# Patient Record
Sex: Female | Born: 1950
Health system: Southern US, Community
[De-identification: ages and names within clinical notes are randomized; demographics above are authoritative.]

## PROBLEM LIST (undated history)

## (undated) DIAGNOSIS — N393 Stress incontinence (female) (male): Secondary | ICD-10-CM

## (undated) DIAGNOSIS — K5790 Diverticulosis of intestine, part unspecified, without perforation or abscess without bleeding: Secondary | ICD-10-CM

## (undated) DIAGNOSIS — E039 Hypothyroidism, unspecified: Secondary | ICD-10-CM

## (undated) DIAGNOSIS — E8729 Other acidosis: Secondary | ICD-10-CM

## (undated) DIAGNOSIS — IMO0001 Reserved for inherently not codable concepts without codable children: Secondary | ICD-10-CM

## (undated) DIAGNOSIS — Z9889 Other specified postprocedural states: Secondary | ICD-10-CM

## (undated) DIAGNOSIS — E782 Mixed hyperlipidemia: Secondary | ICD-10-CM

## (undated) DIAGNOSIS — I10 Essential (primary) hypertension: Secondary | ICD-10-CM

## (undated) DIAGNOSIS — Z720 Tobacco use: Secondary | ICD-10-CM

## (undated) DIAGNOSIS — Z860100 Personal history of colon polyps, unspecified: Secondary | ICD-10-CM

## (undated) DIAGNOSIS — M1990 Unspecified osteoarthritis, unspecified site: Secondary | ICD-10-CM

## (undated) DIAGNOSIS — K589 Irritable bowel syndrome without diarrhea: Secondary | ICD-10-CM

## (undated) DIAGNOSIS — J449 Chronic obstructive pulmonary disease, unspecified: Secondary | ICD-10-CM

## (undated) DIAGNOSIS — E119 Type 2 diabetes mellitus without complications: Secondary | ICD-10-CM

## (undated) DIAGNOSIS — F4310 Post-traumatic stress disorder, unspecified: Secondary | ICD-10-CM

## (undated) DIAGNOSIS — E079 Disorder of thyroid, unspecified: Secondary | ICD-10-CM

## (undated) DIAGNOSIS — E872 Acidosis: Secondary | ICD-10-CM

## (undated) HISTORY — PX: OTHER SURGICAL HISTORY: SHX169

## (undated) HISTORY — DX: Mixed hyperlipidemia: E78.2

## (undated) HISTORY — PX: TUBAL LIGATION: SHX77

## (undated) HISTORY — DX: Disorder of thyroid, unspecified: E07.9

## (undated) HISTORY — DX: Hypothyroidism, unspecified: E03.9

## (undated) HISTORY — DX: Personal history of colon polyps, unspecified: Z86.0100

## (undated) HISTORY — DX: Personal history of colonic polyps: Z86.010

## (undated) HISTORY — DX: Stress incontinence (female) (male): N39.3

## (undated) HISTORY — DX: Essential (primary) hypertension: I10

## (undated) HISTORY — PX: SKIN LESION EXCISION: SHX2412

## (undated) HISTORY — DX: Acidosis: E87.2

## (undated) HISTORY — DX: Other acidosis: E87.29

## (undated) HISTORY — DX: Type 2 diabetes mellitus without complications: E11.9

## (undated) HISTORY — DX: Other specified postprocedural states: Z98.890

---

## 2001-04-02 ENCOUNTER — Emergency Department (HOSPITAL_COMMUNITY): Admission: EM | Admit: 2001-04-02 | Discharge: 2001-04-02 | Payer: Self-pay | Admitting: Emergency Medicine

## 2001-04-02 ENCOUNTER — Encounter: Payer: Self-pay | Admitting: Emergency Medicine

## 2001-04-27 ENCOUNTER — Ambulatory Visit (HOSPITAL_COMMUNITY): Admission: RE | Admit: 2001-04-27 | Discharge: 2001-04-27 | Payer: Self-pay | Admitting: Internal Medicine

## 2001-04-27 ENCOUNTER — Encounter: Payer: Self-pay | Admitting: Internal Medicine

## 2001-06-20 ENCOUNTER — Encounter: Admission: RE | Admit: 2001-06-20 | Discharge: 2001-09-18 | Payer: Self-pay | Admitting: Internal Medicine

## 2001-08-10 ENCOUNTER — Other Ambulatory Visit: Admission: RE | Admit: 2001-08-10 | Discharge: 2001-08-10 | Payer: Self-pay | Admitting: Obstetrics and Gynecology

## 2001-09-15 ENCOUNTER — Emergency Department (HOSPITAL_COMMUNITY): Admission: EM | Admit: 2001-09-15 | Discharge: 2001-09-15 | Payer: Self-pay | Admitting: Emergency Medicine

## 2001-11-27 ENCOUNTER — Other Ambulatory Visit: Admission: RE | Admit: 2001-11-27 | Discharge: 2001-11-27 | Payer: Self-pay | Admitting: Obstetrics and Gynecology

## 2002-03-31 ENCOUNTER — Inpatient Hospital Stay (HOSPITAL_COMMUNITY): Admission: EM | Admit: 2002-03-31 | Discharge: 2002-04-05 | Payer: Self-pay | Admitting: *Deleted

## 2002-05-02 ENCOUNTER — Emergency Department (HOSPITAL_COMMUNITY): Admission: EM | Admit: 2002-05-02 | Discharge: 2002-05-02 | Payer: Self-pay | Admitting: *Deleted

## 2002-05-02 ENCOUNTER — Encounter: Payer: Self-pay | Admitting: *Deleted

## 2002-06-12 ENCOUNTER — Ambulatory Visit (HOSPITAL_COMMUNITY): Admission: RE | Admit: 2002-06-12 | Discharge: 2002-06-12 | Payer: Self-pay | Admitting: Internal Medicine

## 2002-06-12 ENCOUNTER — Encounter: Payer: Self-pay | Admitting: Internal Medicine

## 2002-08-01 ENCOUNTER — Emergency Department (HOSPITAL_COMMUNITY): Admission: EM | Admit: 2002-08-01 | Discharge: 2002-08-02 | Payer: Self-pay | Admitting: *Deleted

## 2002-08-14 ENCOUNTER — Emergency Department (HOSPITAL_COMMUNITY): Admission: EM | Admit: 2002-08-14 | Discharge: 2002-08-14 | Payer: Self-pay | Admitting: Emergency Medicine

## 2002-08-24 ENCOUNTER — Encounter: Payer: Self-pay | Admitting: Emergency Medicine

## 2002-08-24 ENCOUNTER — Emergency Department (HOSPITAL_COMMUNITY): Admission: EM | Admit: 2002-08-24 | Discharge: 2002-08-24 | Payer: Self-pay | Admitting: Internal Medicine

## 2003-04-30 ENCOUNTER — Emergency Department (HOSPITAL_COMMUNITY): Admission: EM | Admit: 2003-04-30 | Discharge: 2003-04-30 | Payer: Self-pay | Admitting: Emergency Medicine

## 2003-07-24 ENCOUNTER — Emergency Department (HOSPITAL_COMMUNITY): Admission: EM | Admit: 2003-07-24 | Discharge: 2003-07-24 | Payer: Self-pay | Admitting: Emergency Medicine

## 2003-07-24 ENCOUNTER — Encounter: Payer: Self-pay | Admitting: Emergency Medicine

## 2003-12-10 ENCOUNTER — Ambulatory Visit (HOSPITAL_COMMUNITY): Admission: RE | Admit: 2003-12-10 | Discharge: 2003-12-10 | Payer: Self-pay | Admitting: Internal Medicine

## 2003-12-13 ENCOUNTER — Emergency Department (HOSPITAL_COMMUNITY): Admission: EM | Admit: 2003-12-13 | Discharge: 2003-12-14 | Payer: Self-pay | Admitting: Internal Medicine

## 2004-03-23 ENCOUNTER — Emergency Department (HOSPITAL_COMMUNITY): Admission: EM | Admit: 2004-03-23 | Discharge: 2004-03-24 | Payer: Self-pay | Admitting: Emergency Medicine

## 2004-04-03 ENCOUNTER — Emergency Department (HOSPITAL_COMMUNITY): Admission: EM | Admit: 2004-04-03 | Discharge: 2004-04-04 | Payer: Self-pay | Admitting: Emergency Medicine

## 2004-04-10 ENCOUNTER — Emergency Department (HOSPITAL_COMMUNITY): Admission: EM | Admit: 2004-04-10 | Discharge: 2004-04-10 | Payer: Self-pay | Admitting: Emergency Medicine

## 2004-04-17 ENCOUNTER — Ambulatory Visit (HOSPITAL_COMMUNITY): Admission: RE | Admit: 2004-04-17 | Discharge: 2004-04-17 | Payer: Self-pay | Admitting: Internal Medicine

## 2004-04-17 IMAGING — CR DG SHOULDER 2+V*L*
3 series · 3 of 3 positions shown · non-contrast
Comparison: none

CLINICAL DATA: Osteoarthritis.
 LEFT SHOULDER THREE VIEWS
 Joint space narrowing left glenohumeral joint.  No fracture or dislocation.  AC joint alignment normal.  Glenohumeral joint space narrowing seen.  
 IMPRESSION
 Glenohumeral joint space narrowing without acute bony abnormality.

[view not recorded (1 of 3)]
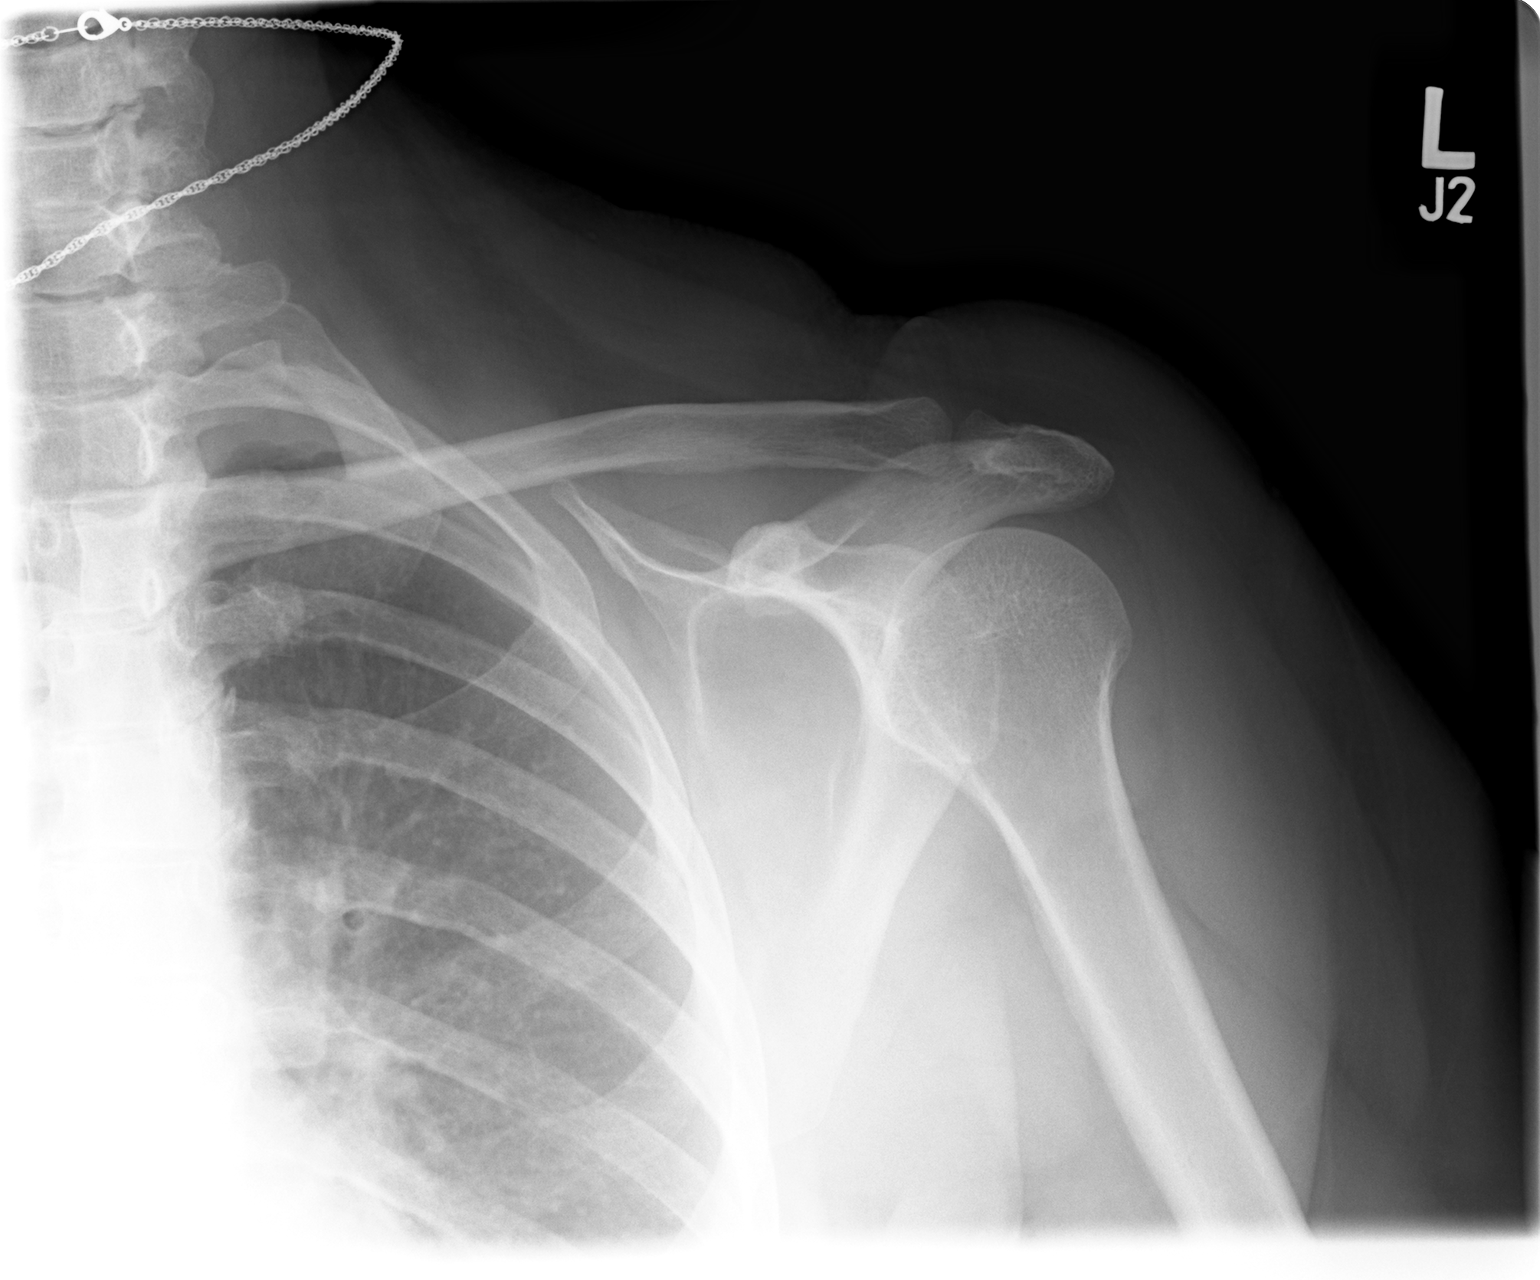

[view not recorded (2 of 3)]
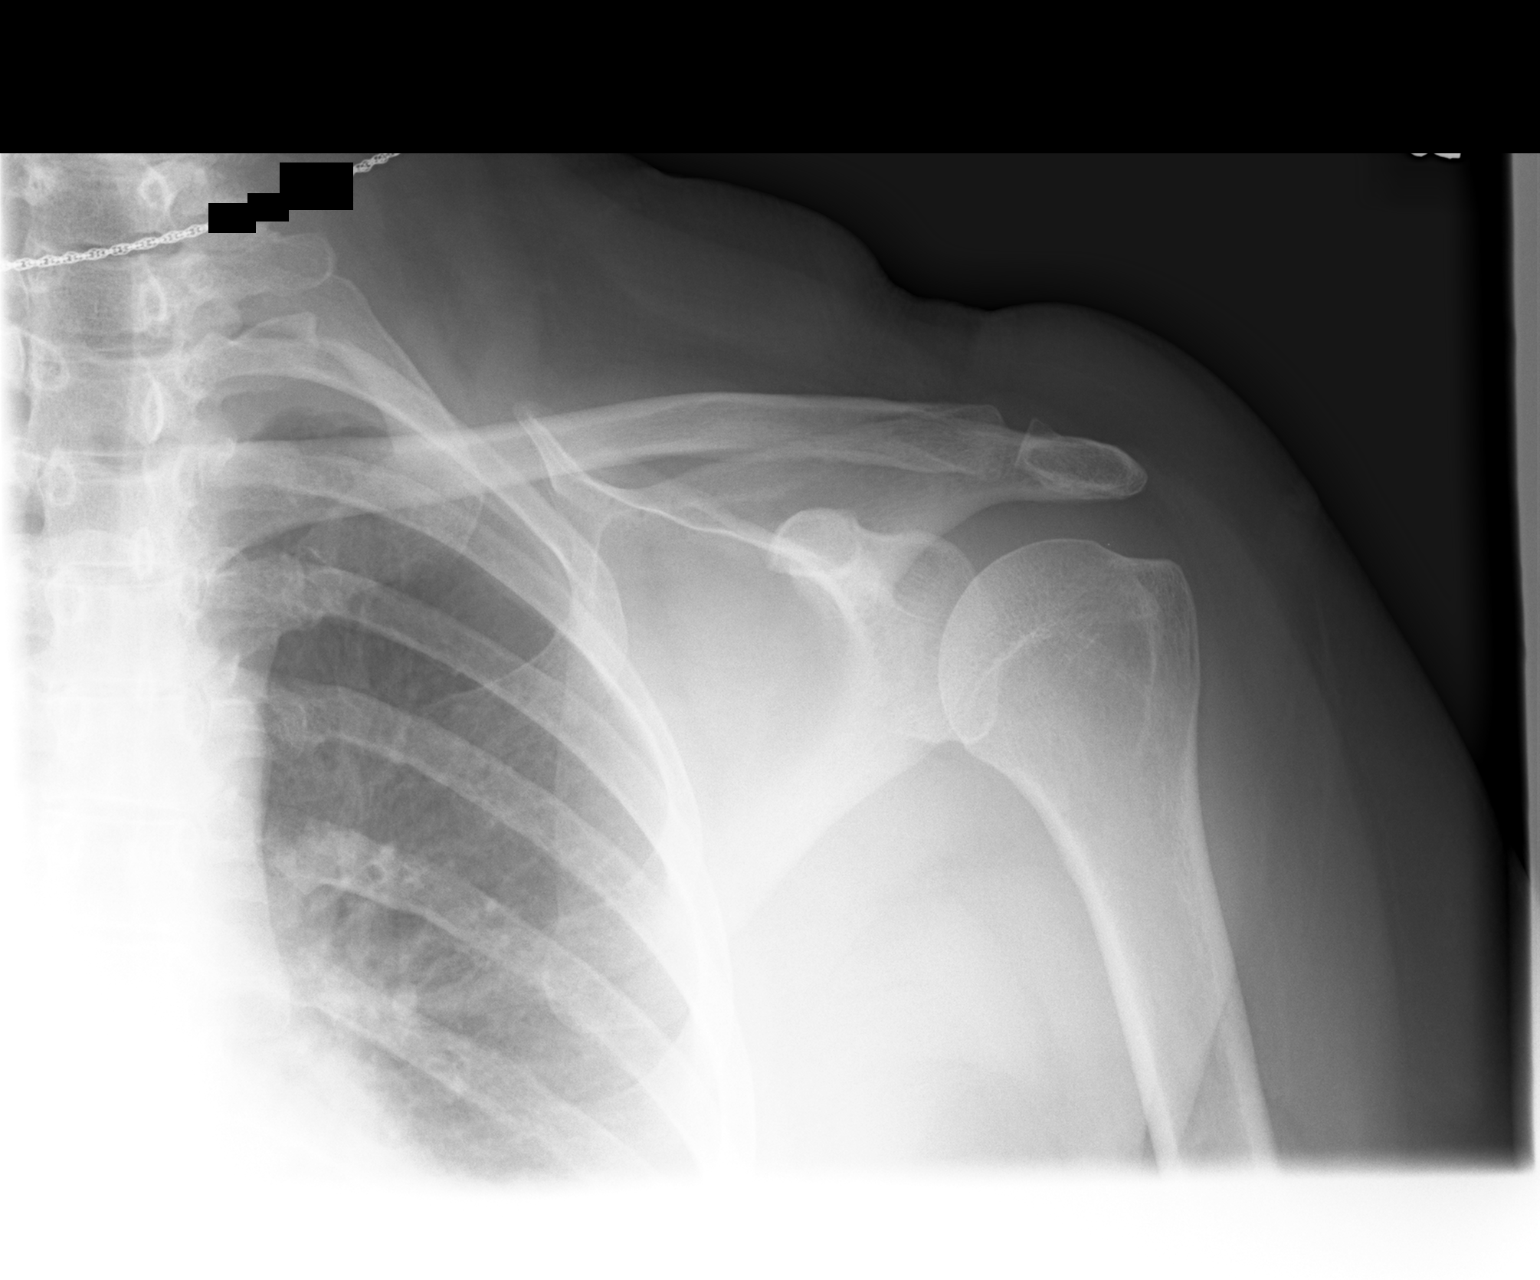

[view not recorded (3 of 3)]
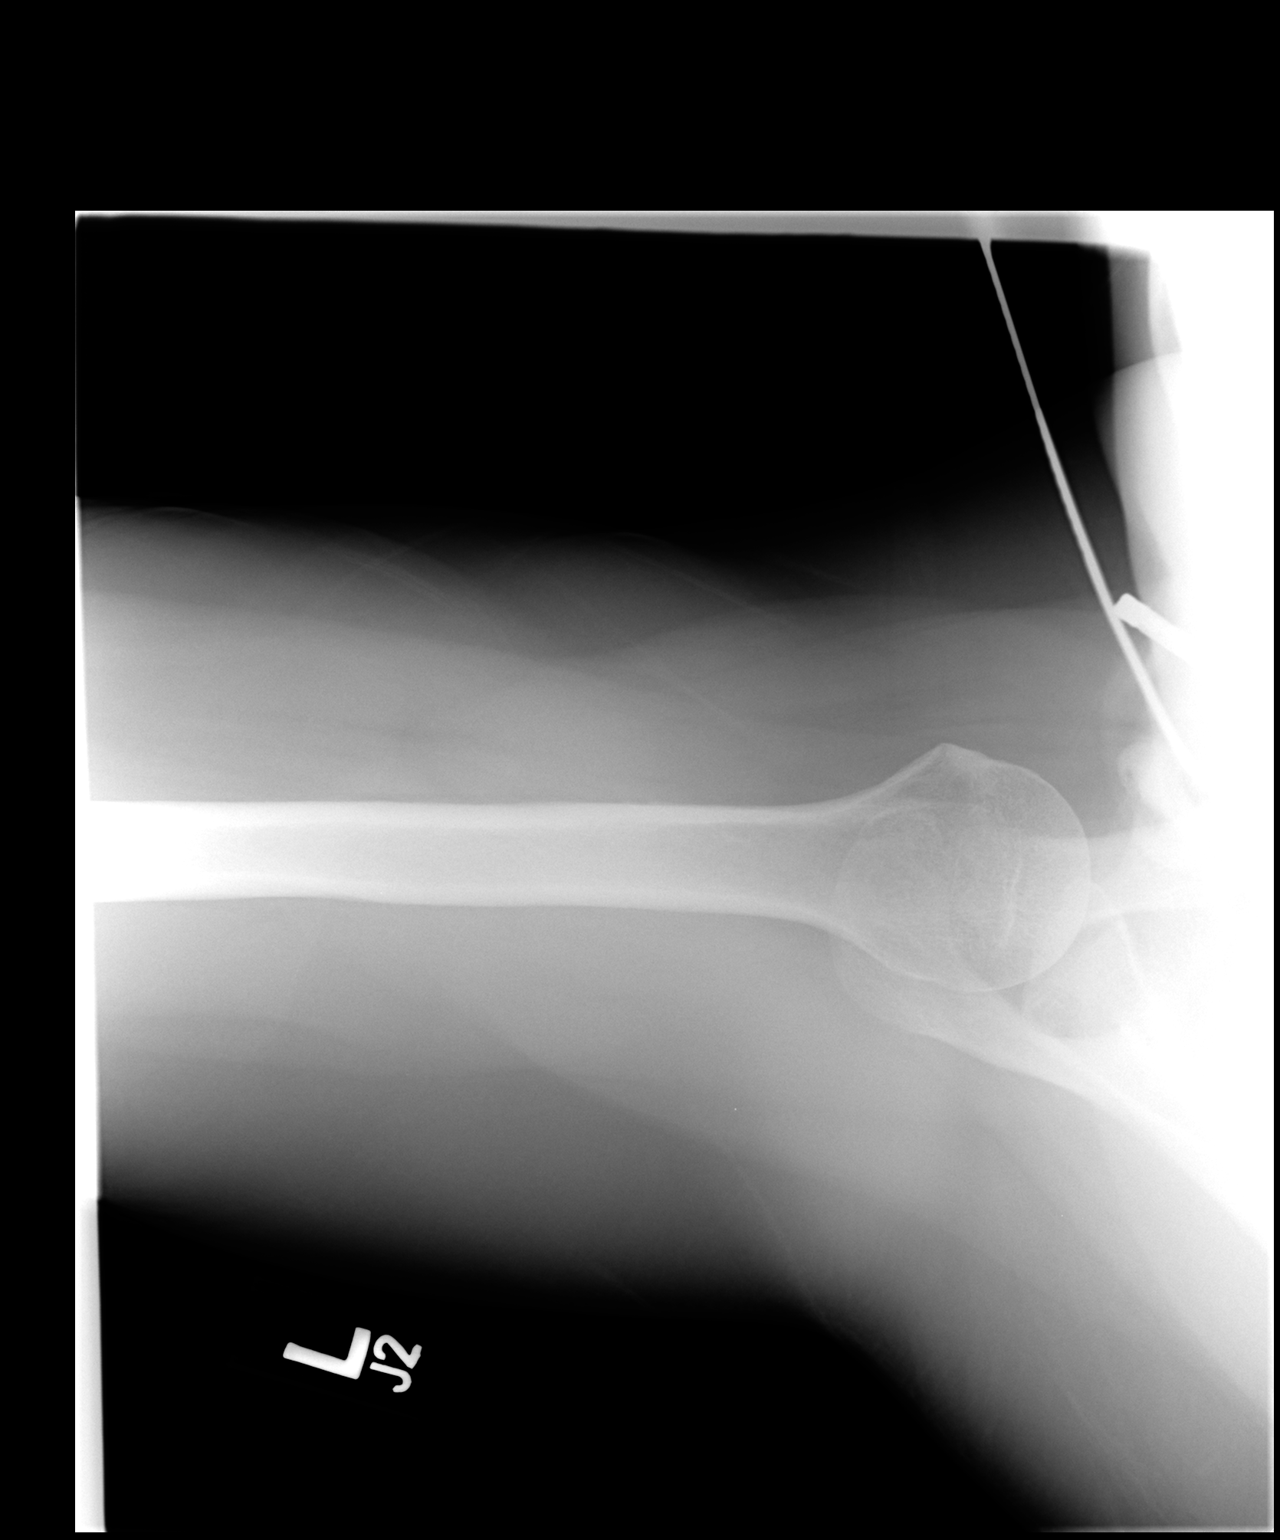

[3 of 3 positions shown; findings below may reference images not displayed]

## 2004-06-06 ENCOUNTER — Inpatient Hospital Stay (HOSPITAL_COMMUNITY): Admission: AD | Admit: 2004-06-06 | Discharge: 2004-06-12 | Payer: Self-pay | Admitting: Psychiatry

## 2004-09-07 ENCOUNTER — Emergency Department (HOSPITAL_COMMUNITY): Admission: EM | Admit: 2004-09-07 | Discharge: 2004-09-07 | Payer: Self-pay | Admitting: *Deleted

## 2005-04-12 ENCOUNTER — Ambulatory Visit (HOSPITAL_COMMUNITY): Admission: RE | Admit: 2005-04-12 | Discharge: 2005-04-12 | Payer: Self-pay | Admitting: Obstetrics & Gynecology

## 2005-11-06 ENCOUNTER — Emergency Department (HOSPITAL_COMMUNITY): Admission: EM | Admit: 2005-11-06 | Discharge: 2005-11-07 | Payer: Self-pay | Admitting: Emergency Medicine

## 2006-01-11 ENCOUNTER — Emergency Department (HOSPITAL_COMMUNITY): Admission: EM | Admit: 2006-01-11 | Discharge: 2006-01-11 | Payer: Self-pay | Admitting: Emergency Medicine

## 2006-01-11 IMAGING — CR DG LUMBAR SPINE COMPLETE 4+V
5 series · 5 of 5 positions shown · non-contrast
Comparison: None.

CLINICAL DATA: Back strain/lower back pain.
 LUMBAR SPINE ? 4 VIEW:

[view not recorded (1 of 5)]
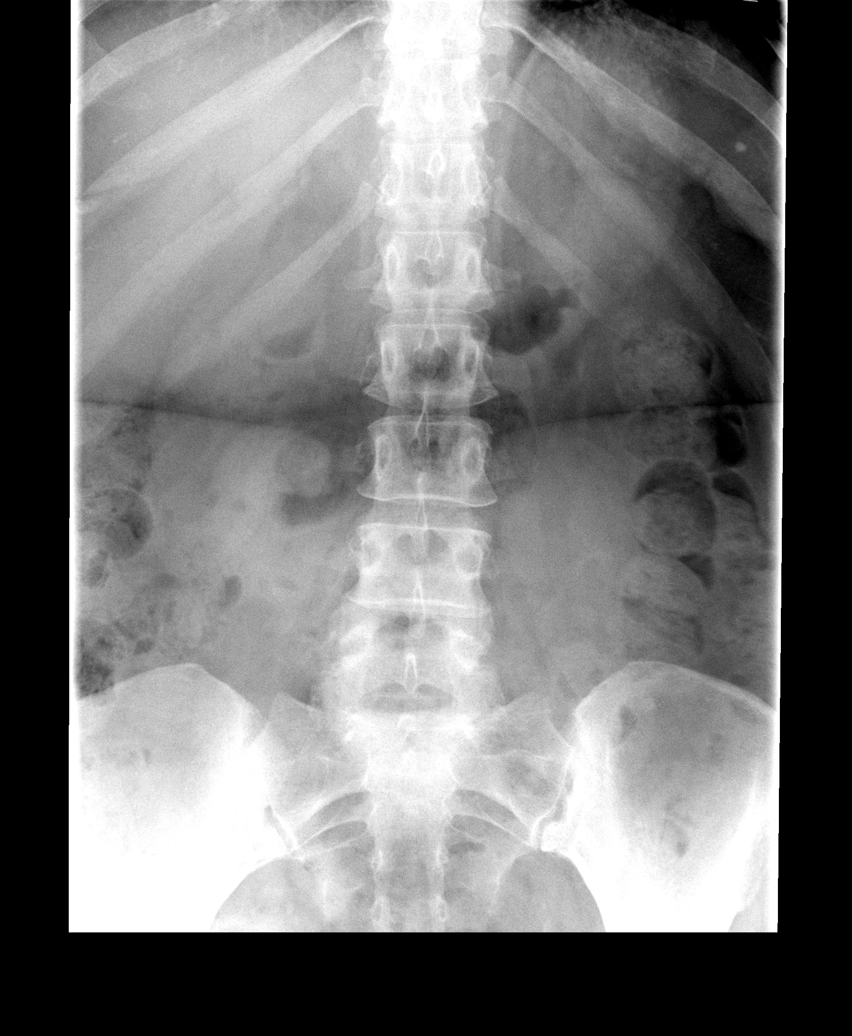

[view not recorded (2 of 5)]
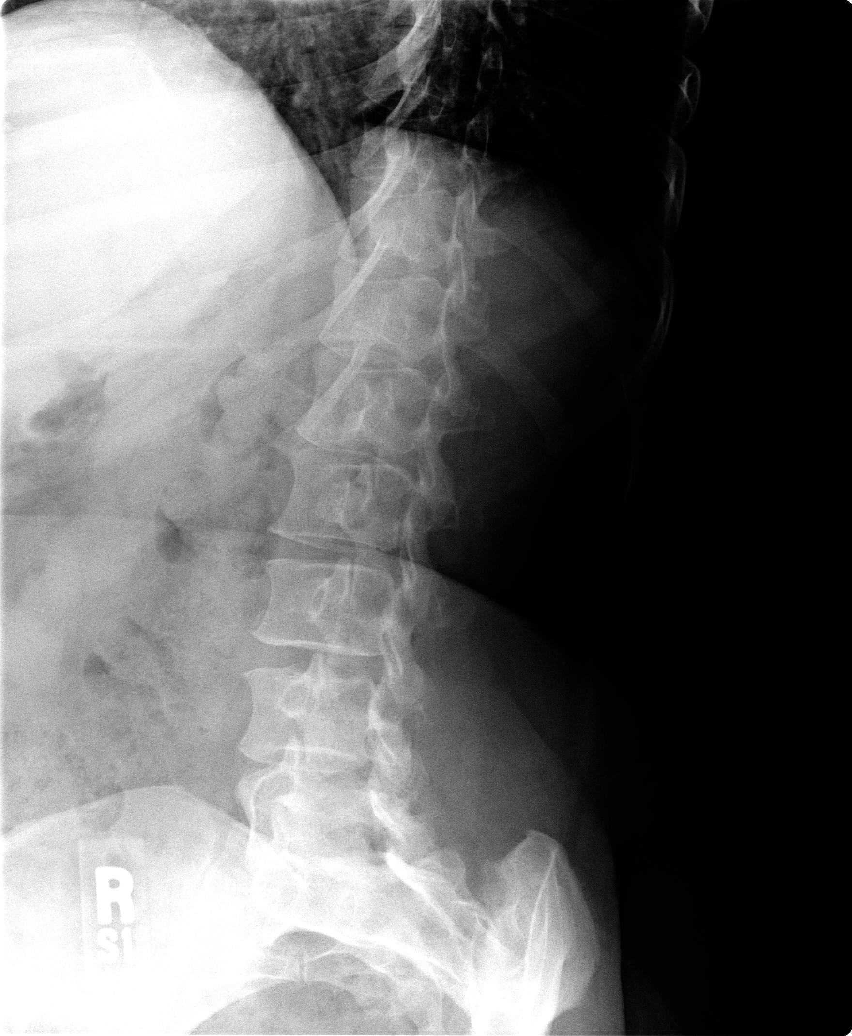

[view not recorded (3 of 5)]
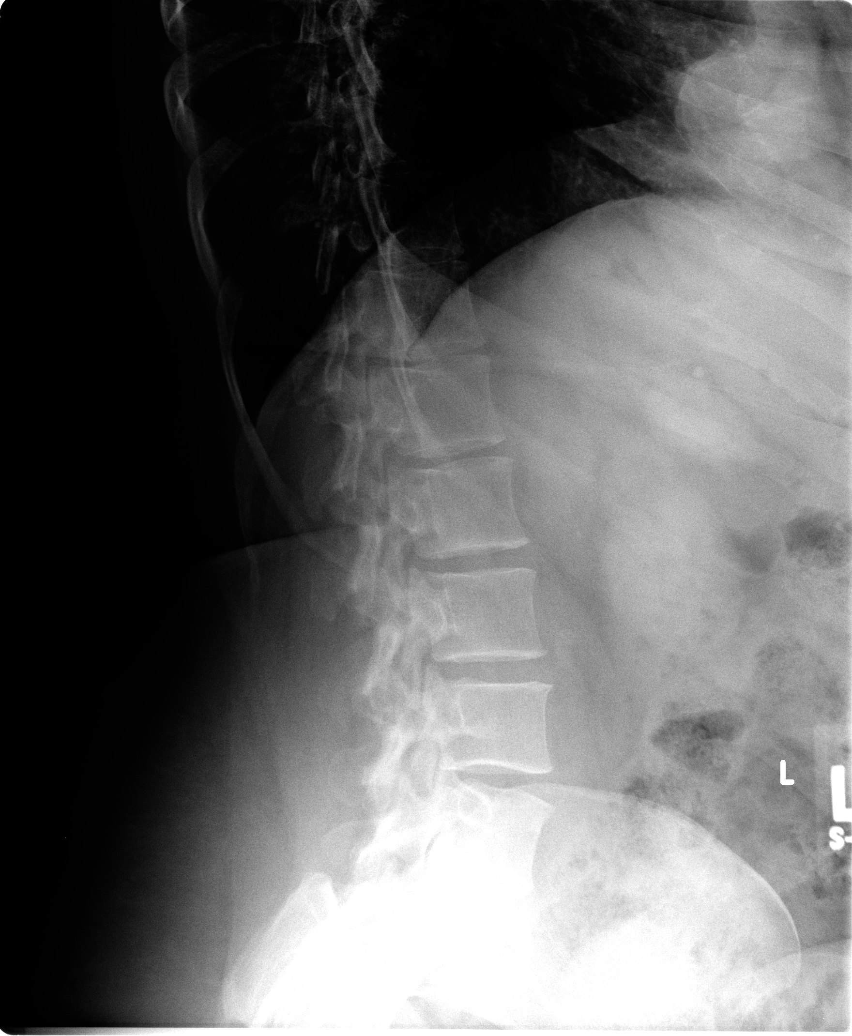

[view not recorded (4 of 5)]
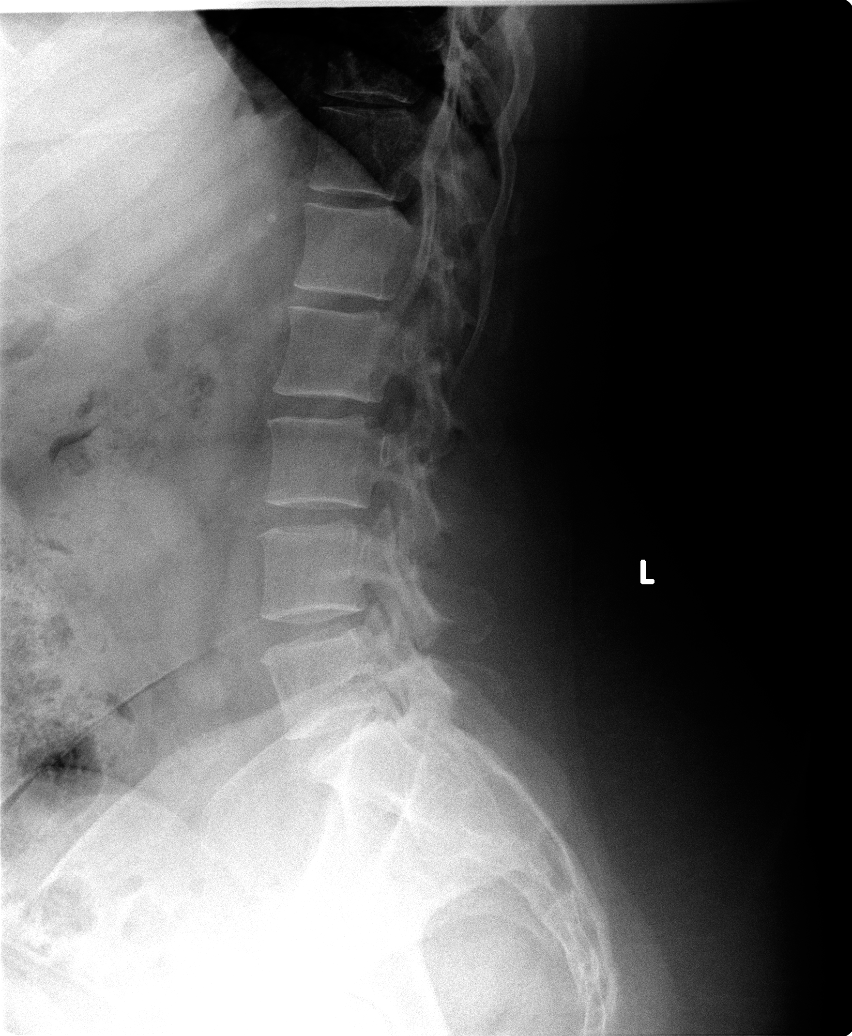

[view not recorded (5 of 5)]
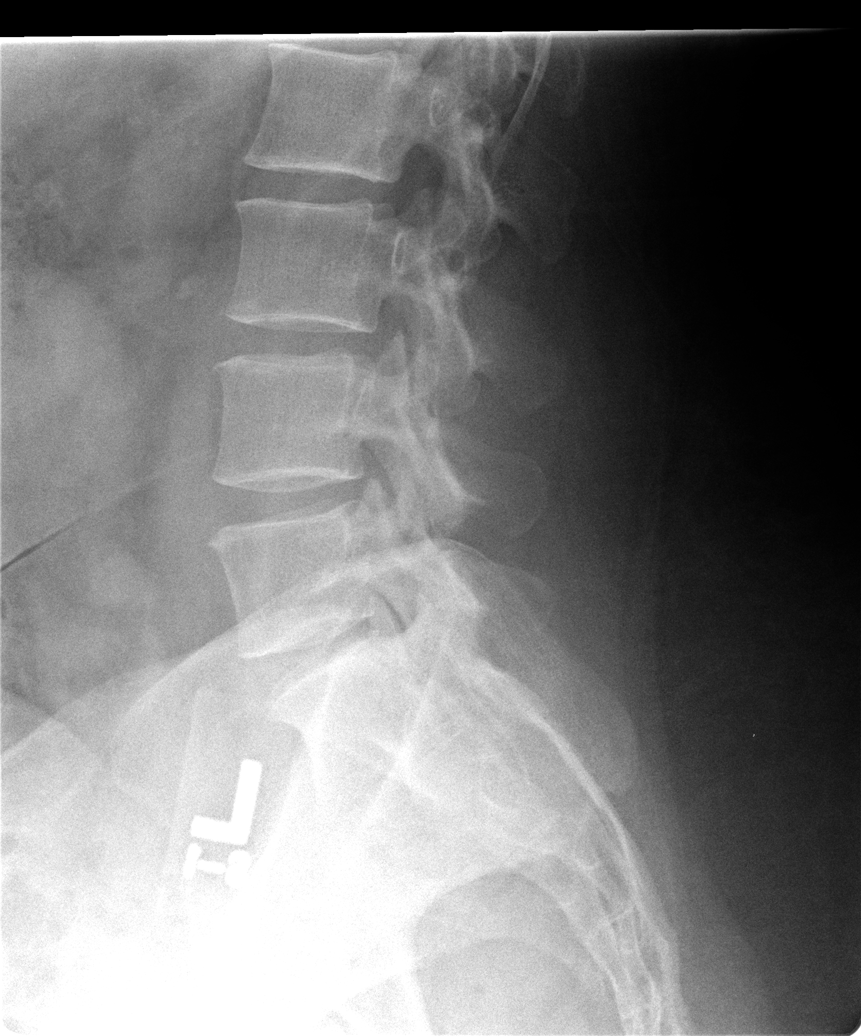

[5 of 5 positions shown; findings below may reference images not displayed]

FINDINGS: There is disk narrowing at L5-S1.  Other disk space height fairly well maintained.  There are minor degenerative changes of the vertebral bodies, and to a lesser degree of the SI joints.  No acute findings.  Soft tissues normal.
IMPRESSION: Degenerative disk disease at L5-S1.  No acute findings.

## 2006-05-25 ENCOUNTER — Emergency Department (HOSPITAL_COMMUNITY): Admission: EM | Admit: 2006-05-25 | Discharge: 2006-05-25 | Payer: Self-pay | Admitting: Emergency Medicine

## 2006-05-25 IMAGING — CR DG CHEST 2V
2 series · 2 of 2 positions shown · non-contrast
Comparison: none

CLINICAL DATA: Cough and pleuritic chest pain.  
 CHEST ? 2 VIEW:

[view not recorded (1 of 2)]
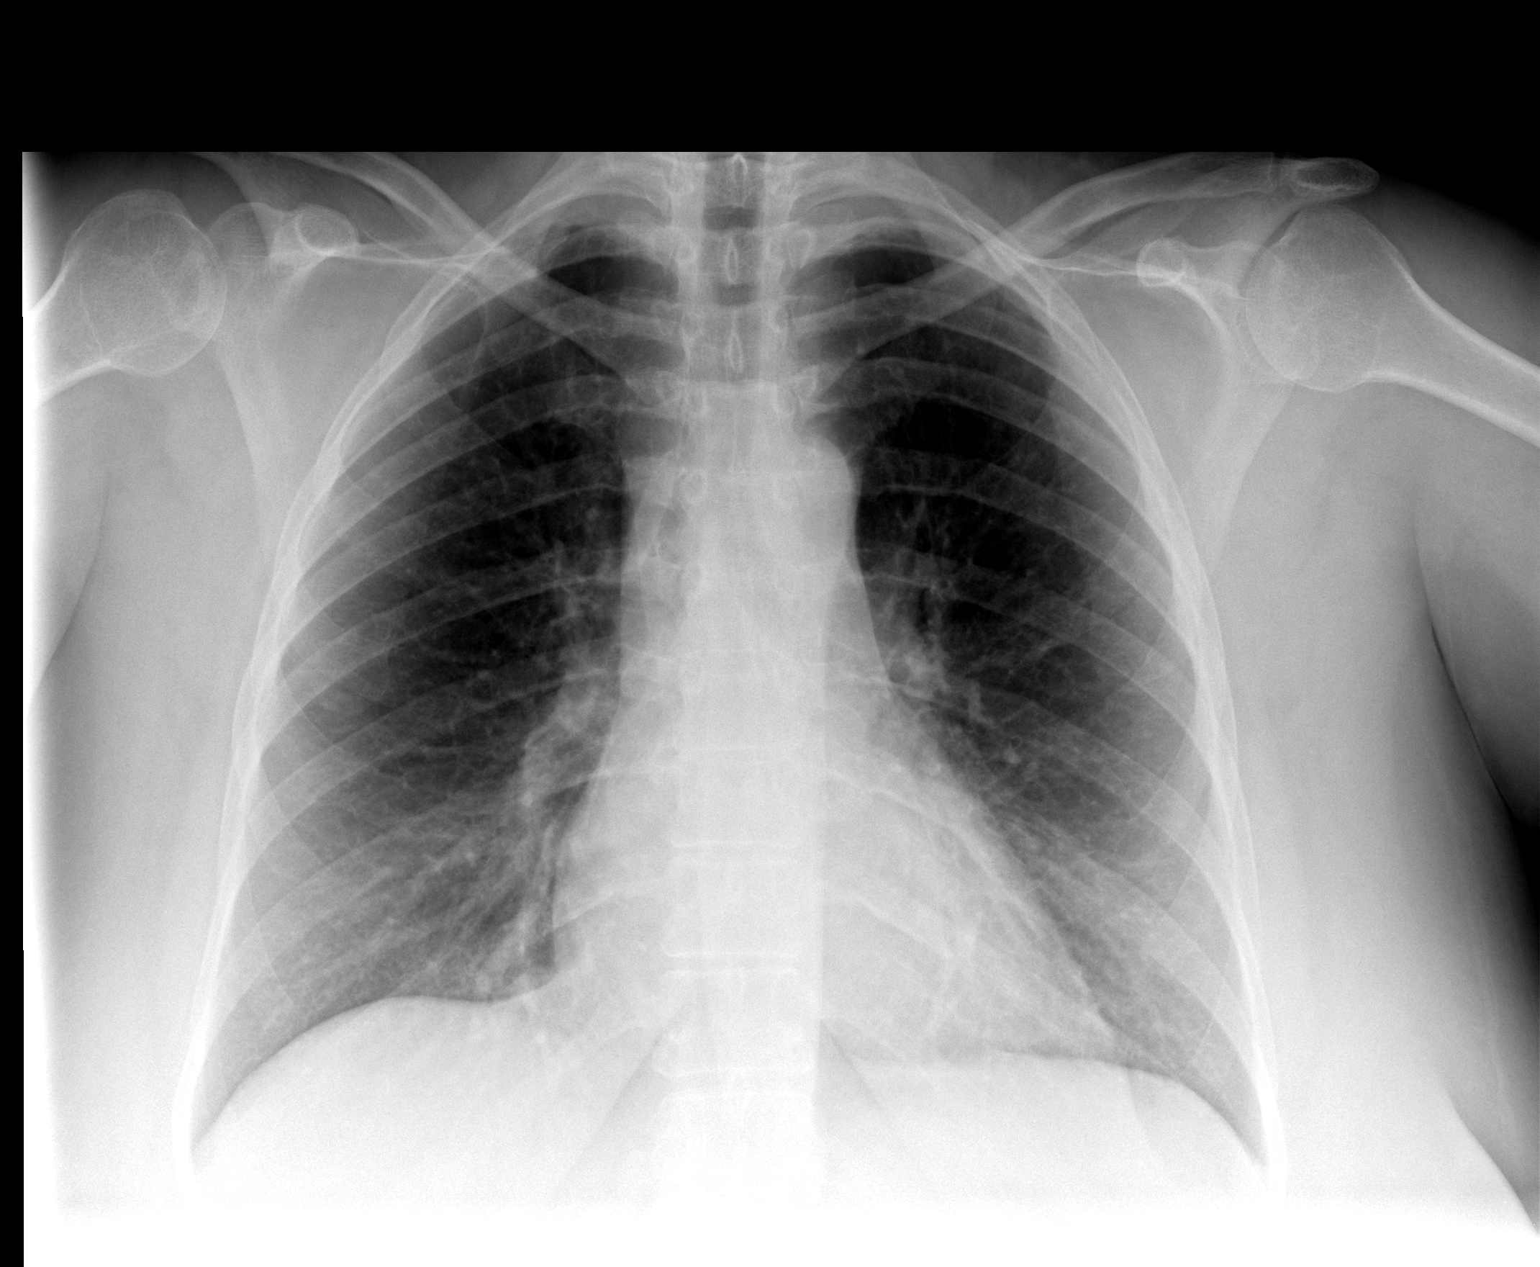

[view not recorded (2 of 2)]
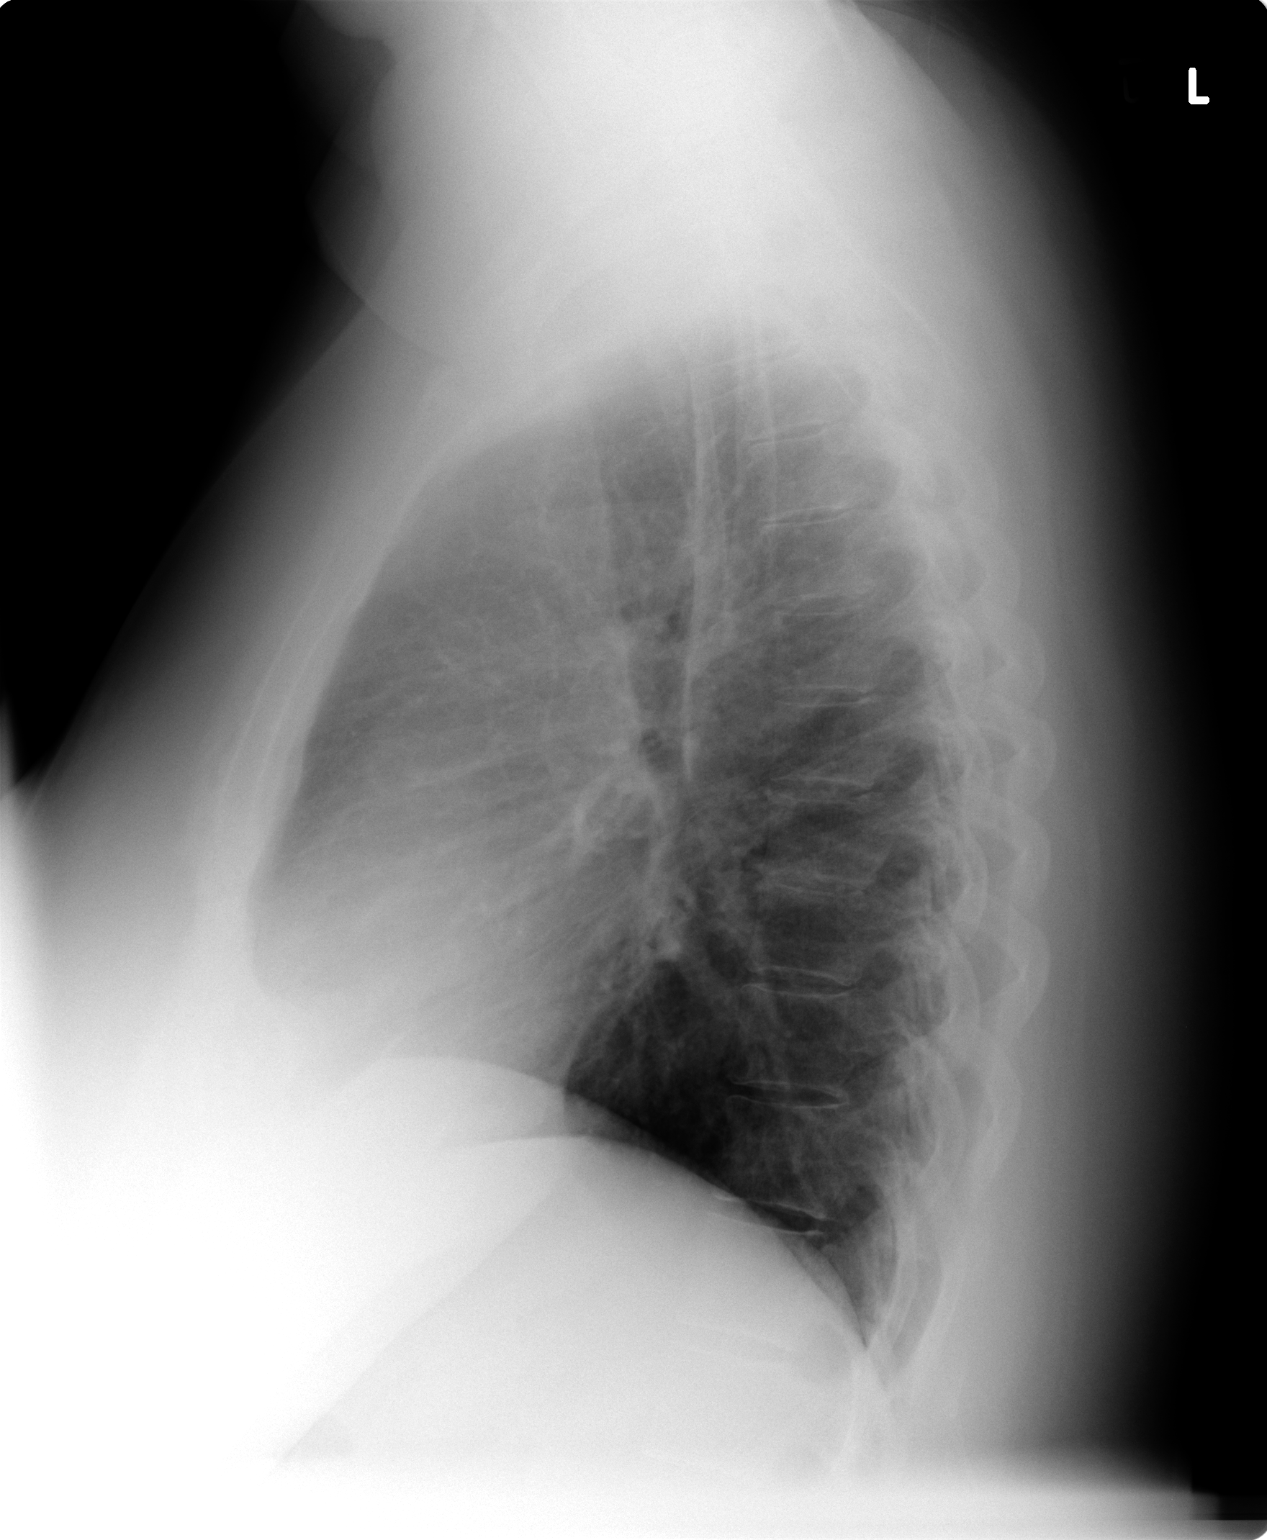

[2 of 2 positions shown; findings below may reference images not displayed]

FINDINGS: The heart size and mediastinal contours are within normal limits.  Both lungs are clear.  The visualized skeletal structures are unremarkable.
IMPRESSION: No active cardiopulmonary disease.

## 2006-06-04 ENCOUNTER — Emergency Department (HOSPITAL_COMMUNITY): Admission: EM | Admit: 2006-06-04 | Discharge: 2006-06-04 | Payer: Self-pay | Admitting: Emergency Medicine

## 2006-07-02 ENCOUNTER — Emergency Department (HOSPITAL_COMMUNITY): Admission: EM | Admit: 2006-07-02 | Discharge: 2006-07-02 | Payer: Self-pay | Admitting: Emergency Medicine

## 2006-09-20 ENCOUNTER — Emergency Department (HOSPITAL_COMMUNITY): Admission: EM | Admit: 2006-09-20 | Discharge: 2006-09-20 | Payer: Self-pay | Admitting: Emergency Medicine

## 2007-02-10 ENCOUNTER — Emergency Department (HOSPITAL_COMMUNITY): Admission: EM | Admit: 2007-02-10 | Discharge: 2007-02-10 | Payer: Self-pay | Admitting: Emergency Medicine

## 2007-04-05 ENCOUNTER — Emergency Department (HOSPITAL_COMMUNITY): Admission: EM | Admit: 2007-04-05 | Discharge: 2007-04-05 | Payer: Self-pay | Admitting: Emergency Medicine

## 2007-08-03 ENCOUNTER — Emergency Department (HOSPITAL_COMMUNITY): Admission: EM | Admit: 2007-08-03 | Discharge: 2007-08-03 | Payer: Self-pay | Admitting: Emergency Medicine

## 2007-08-14 ENCOUNTER — Ambulatory Visit (HOSPITAL_COMMUNITY): Admission: RE | Admit: 2007-08-14 | Discharge: 2007-08-14 | Payer: Self-pay | Admitting: Internal Medicine

## 2007-11-29 ENCOUNTER — Emergency Department (HOSPITAL_COMMUNITY): Admission: EM | Admit: 2007-11-29 | Discharge: 2007-11-29 | Payer: Self-pay | Admitting: Emergency Medicine

## 2007-11-29 ENCOUNTER — Encounter: Payer: Self-pay | Admitting: Orthopedic Surgery

## 2007-11-29 IMAGING — CR DG ANKLE COMPLETE 3+V*L*
3 series · 3 of 3 positions shown · non-contrast
Comparison: none

CLINICAL DATA: Fall, ankle pain.

LEFT ANKLE - 4 VIEW

[view not recorded (1 of 3)]
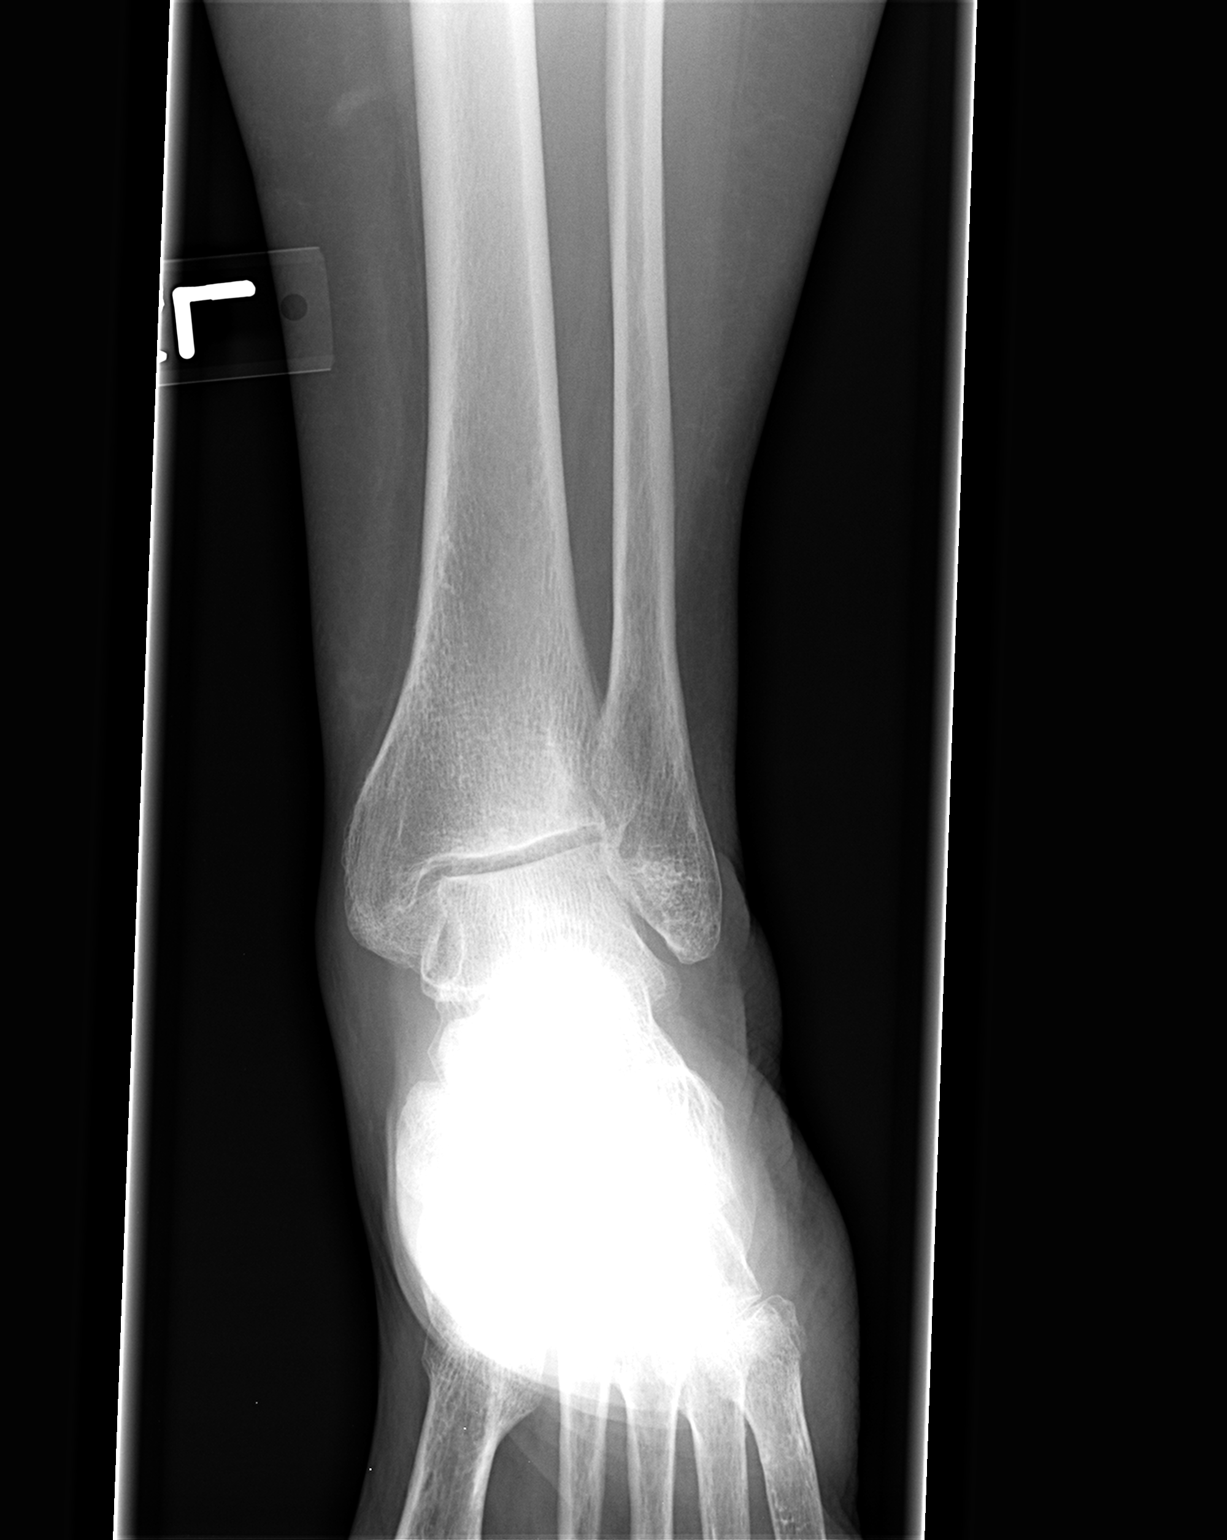

[view not recorded (2 of 3)]
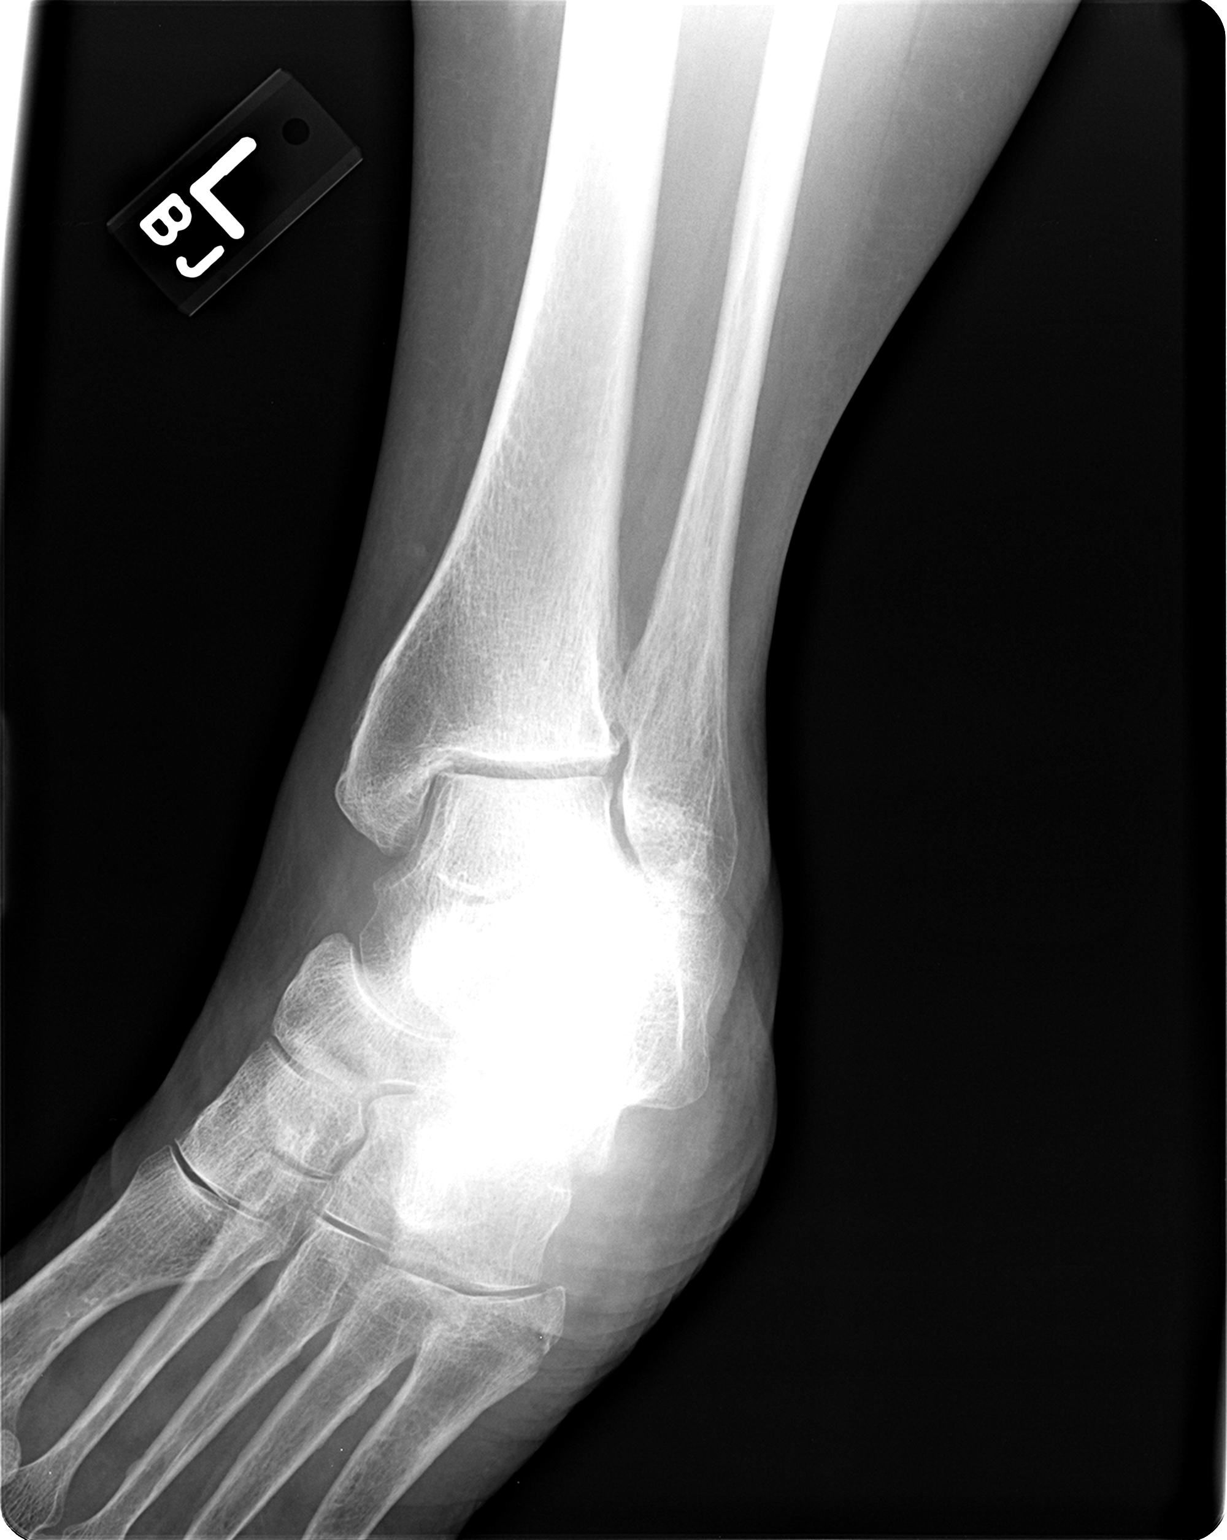

[view not recorded (3 of 3)]
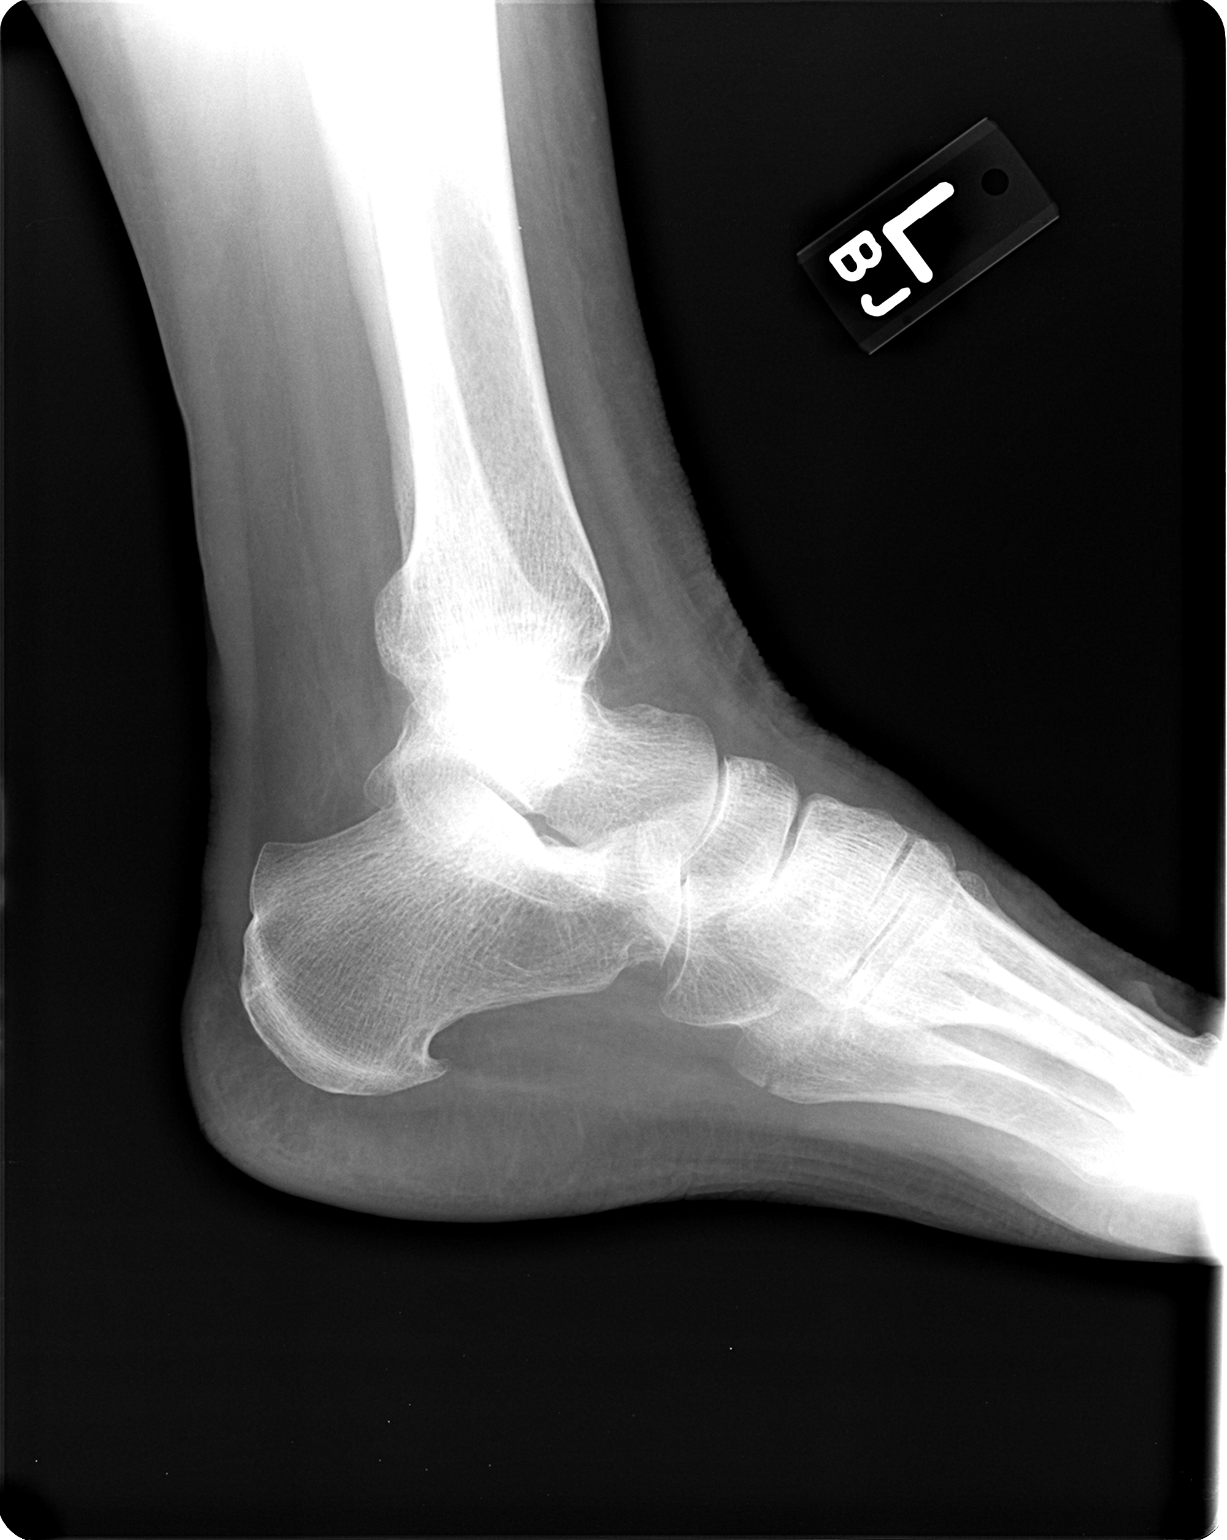

[3 of 3 positions shown; findings below may reference images not displayed]

FINDINGS: Mortise congruent. Mild soft tissue swelling around the inferior
ankle. No fracture identified. Anatomic alignment.

IMPRESSION

Negative ankle radiographs. See foot radiographs below.

Left foot, 3 views, [DATE].
FINDINGS: Left fifth metatarsal base fracture present, transversely oriented and
minimally displaced. Findings consistent with Jones fracture.
IMPRESSION: Jones fracture, left fifth metatarsal base.

## 2007-11-29 IMAGING — CR DG FOOT COMPLETE 3+V*L*
3 series · 3 of 3 positions shown · non-contrast
Comparison: none

CLINICAL DATA: Fall, ankle pain.

LEFT ANKLE - 4 VIEW

[view not recorded (1 of 3)]
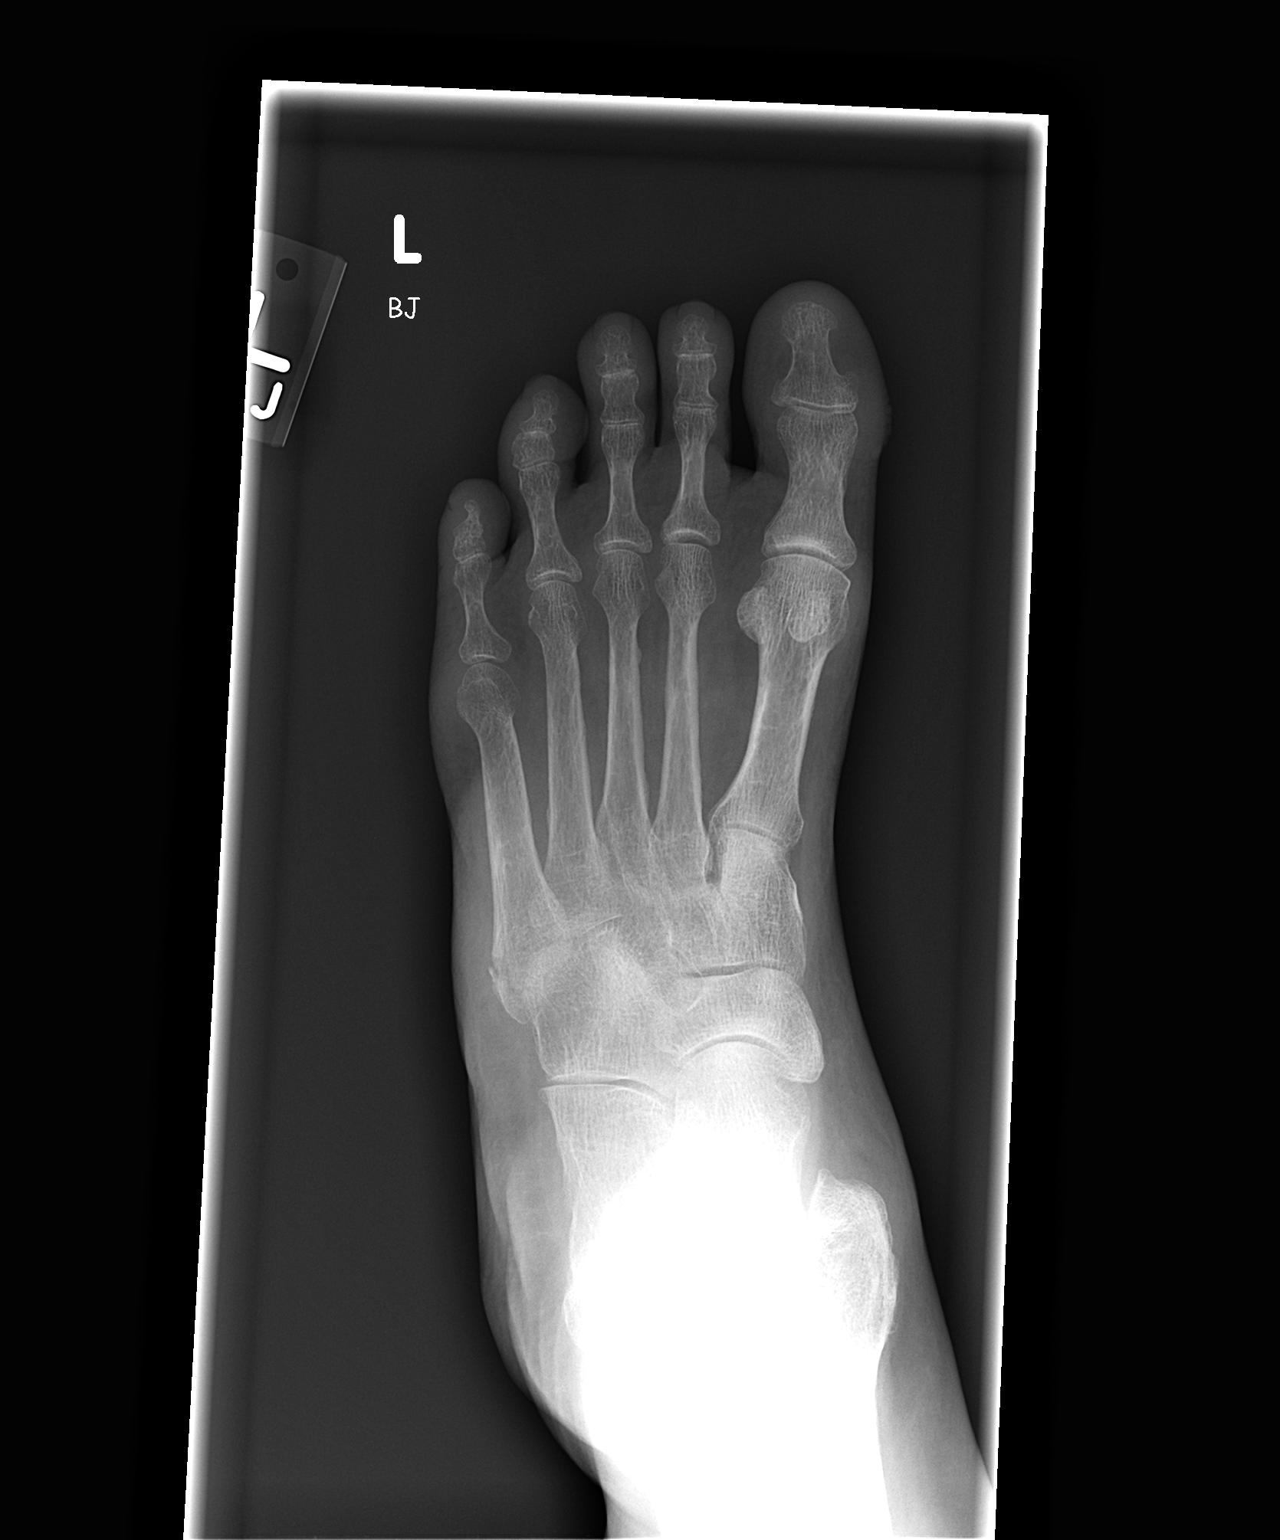

[view not recorded (2 of 3)]
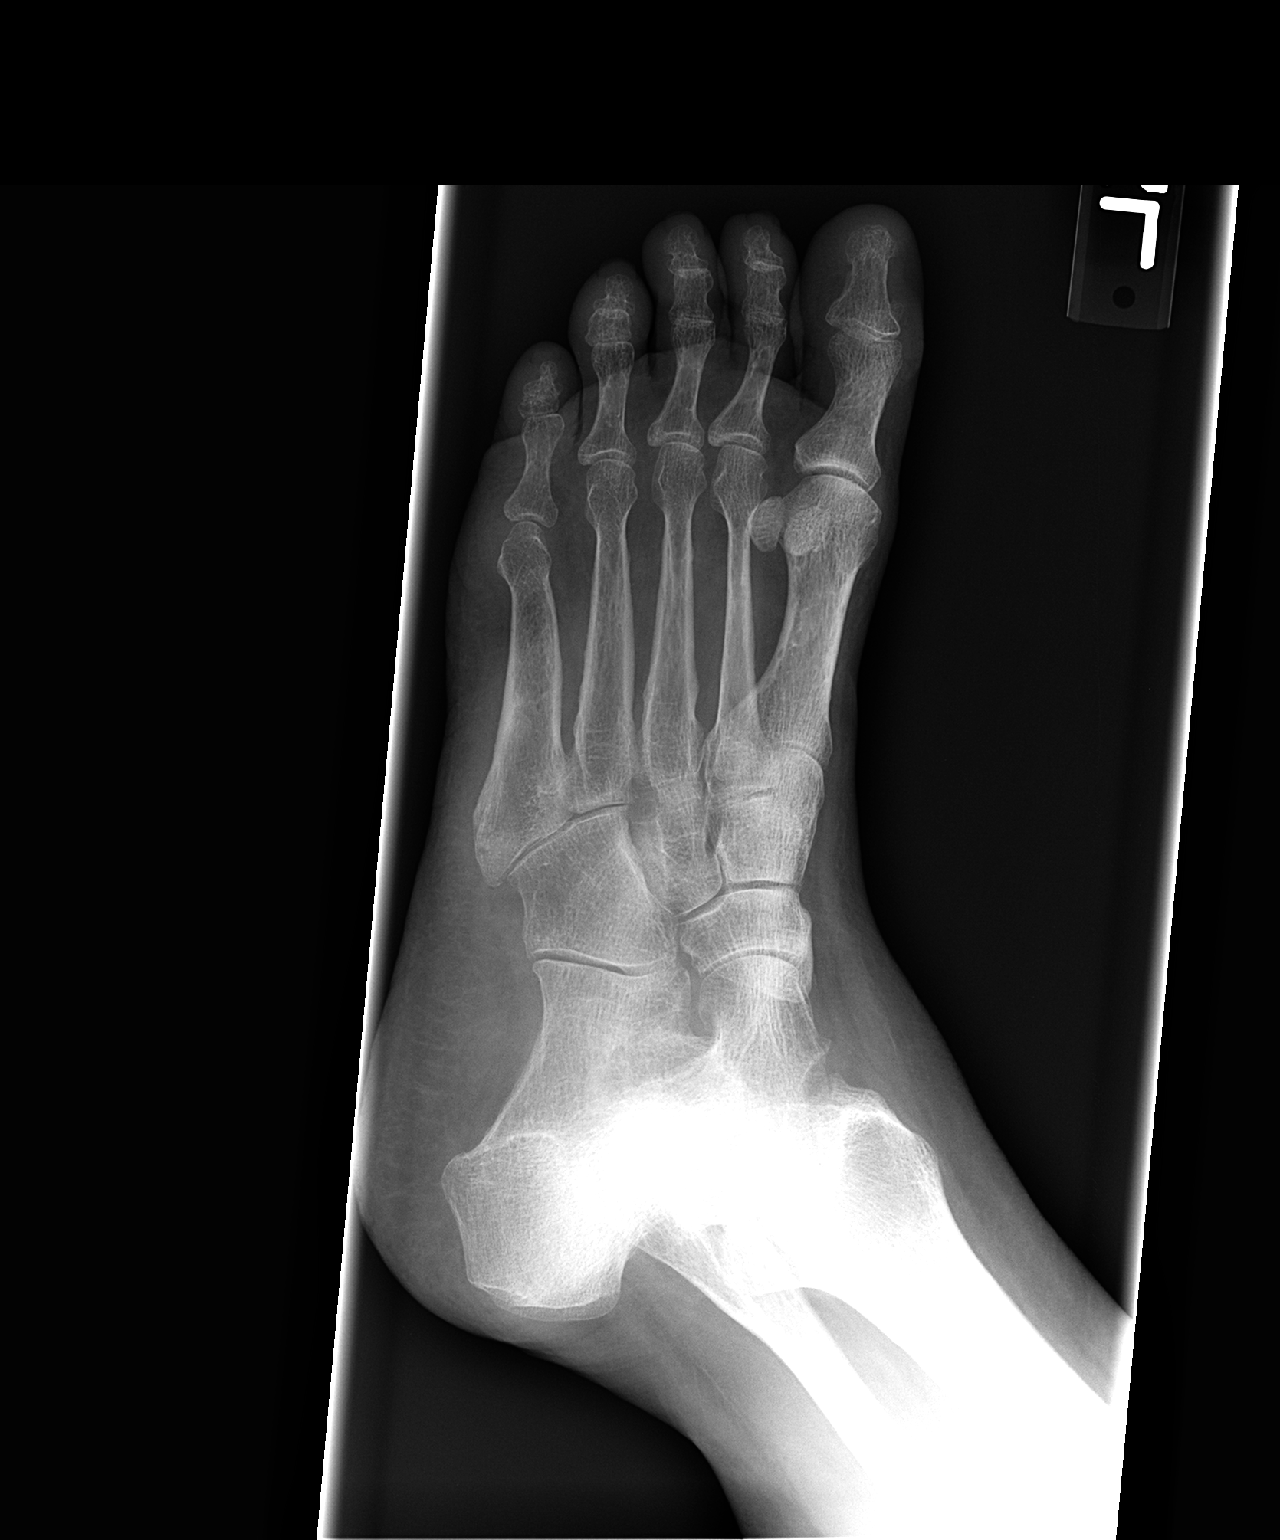

[view not recorded (3 of 3)]
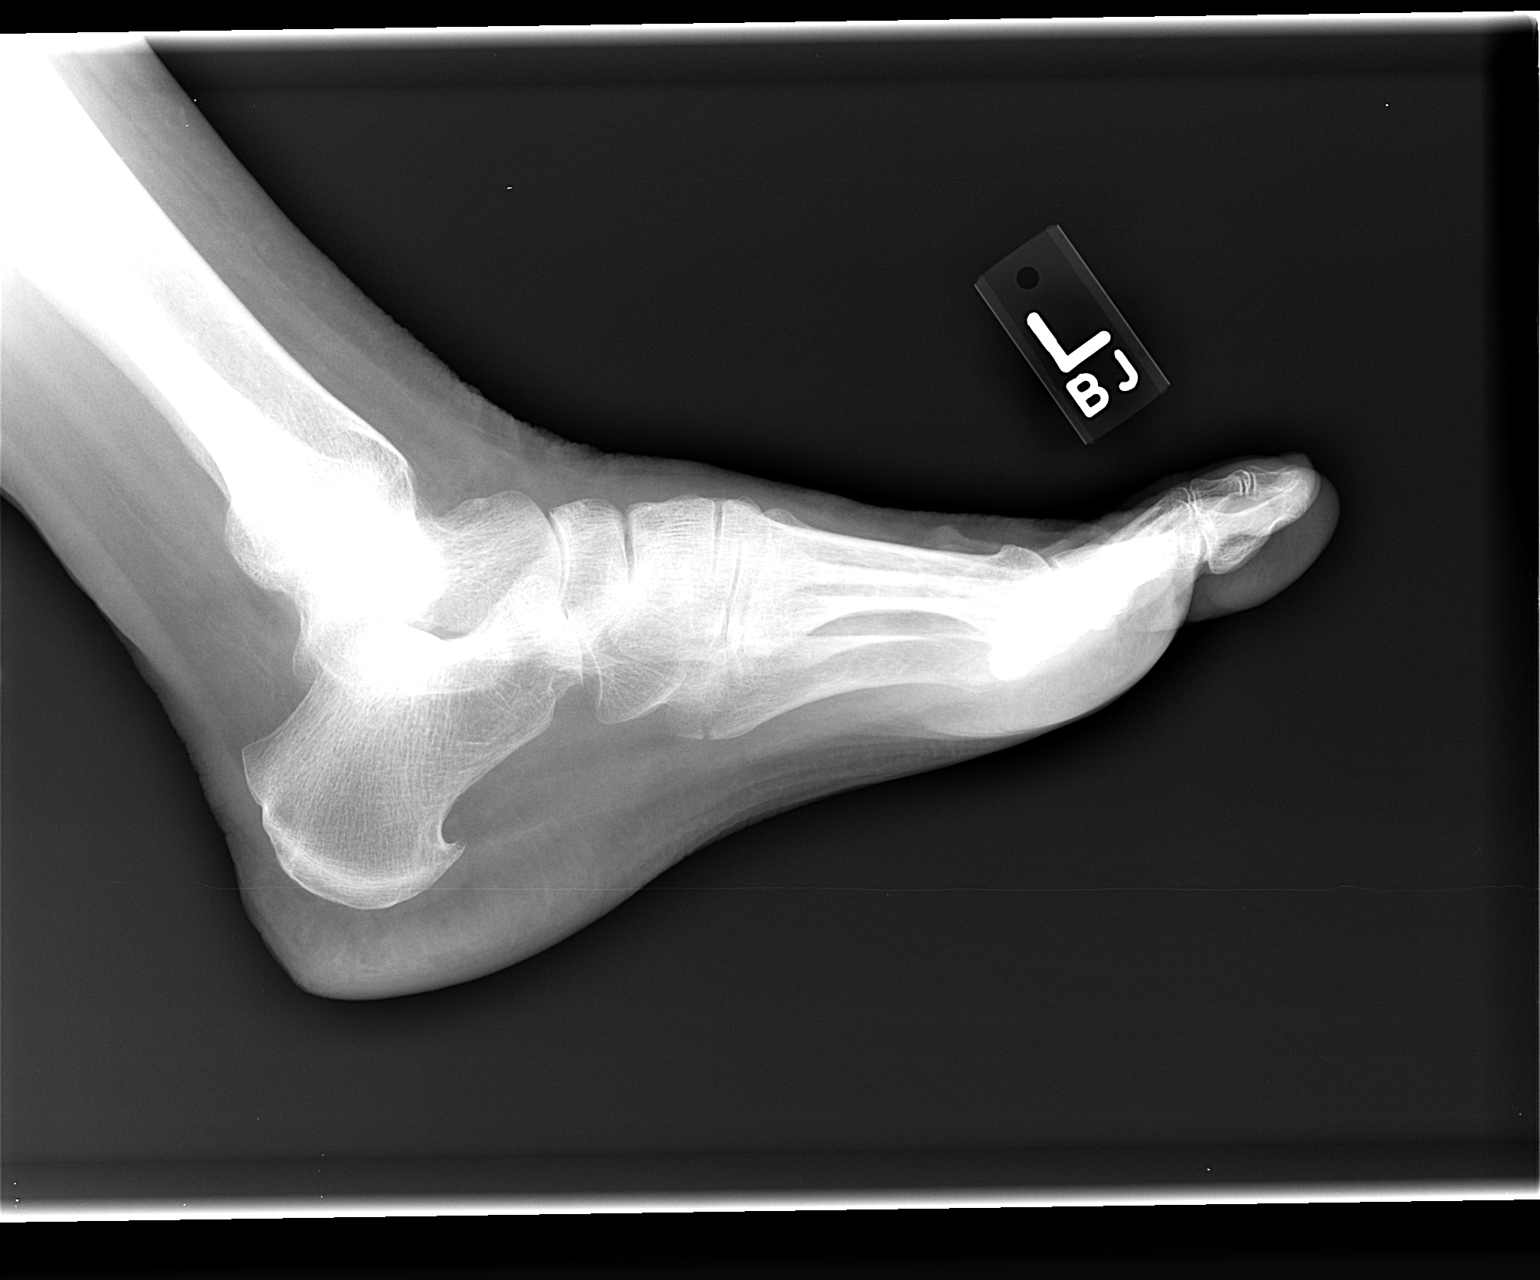

[3 of 3 positions shown; findings below may reference images not displayed]

FINDINGS: Mortise congruent. Mild soft tissue swelling around the inferior
ankle. No fracture identified. Anatomic alignment.

IMPRESSION

Negative ankle radiographs. See foot radiographs below.

Left foot, 3 views, [DATE].
FINDINGS: Left fifth metatarsal base fracture present, transversely oriented and
minimally displaced. Findings consistent with Jones fracture.
IMPRESSION: Jones fracture, left fifth metatarsal base.

## 2007-11-30 ENCOUNTER — Ambulatory Visit: Payer: Self-pay | Admitting: Orthopedic Surgery

## 2007-11-30 DIAGNOSIS — S92309A Fracture of unspecified metatarsal bone(s), unspecified foot, initial encounter for closed fracture: Secondary | ICD-10-CM | POA: Insufficient documentation

## 2007-11-30 DIAGNOSIS — Z8679 Personal history of other diseases of the circulatory system: Secondary | ICD-10-CM | POA: Insufficient documentation

## 2007-11-30 DIAGNOSIS — E119 Type 2 diabetes mellitus without complications: Secondary | ICD-10-CM | POA: Insufficient documentation

## 2008-11-15 ENCOUNTER — Emergency Department (HOSPITAL_COMMUNITY): Admission: EM | Admit: 2008-11-15 | Discharge: 2008-11-15 | Payer: Self-pay | Admitting: Emergency Medicine

## 2009-11-08 DIAGNOSIS — Z9889 Other specified postprocedural states: Secondary | ICD-10-CM

## 2009-11-08 HISTORY — DX: Other specified postprocedural states: Z98.890

## 2009-11-11 ENCOUNTER — Ambulatory Visit: Payer: Self-pay | Admitting: Internal Medicine

## 2009-11-12 DIAGNOSIS — K589 Irritable bowel syndrome without diarrhea: Secondary | ICD-10-CM | POA: Insufficient documentation

## 2009-11-12 DIAGNOSIS — R197 Diarrhea, unspecified: Secondary | ICD-10-CM | POA: Insufficient documentation

## 2009-11-12 DIAGNOSIS — R198 Other specified symptoms and signs involving the digestive system and abdomen: Secondary | ICD-10-CM | POA: Insufficient documentation

## 2009-11-12 DIAGNOSIS — Z86010 Personal history of colonic polyps: Secondary | ICD-10-CM | POA: Insufficient documentation

## 2009-11-12 HISTORY — DX: Diarrhea, unspecified: R19.7

## 2009-11-14 ENCOUNTER — Encounter: Payer: Self-pay | Admitting: Internal Medicine

## 2009-11-17 ENCOUNTER — Ambulatory Visit (HOSPITAL_COMMUNITY): Admission: RE | Admit: 2009-11-17 | Discharge: 2009-11-17 | Payer: Self-pay | Admitting: Internal Medicine

## 2009-11-17 ENCOUNTER — Ambulatory Visit: Payer: Self-pay | Admitting: Internal Medicine

## 2009-11-17 HISTORY — PX: COLONOSCOPY: SHX174

## 2009-11-20 ENCOUNTER — Encounter (INDEPENDENT_AMBULATORY_CARE_PROVIDER_SITE_OTHER): Payer: Self-pay

## 2009-11-20 ENCOUNTER — Emergency Department (HOSPITAL_COMMUNITY): Admission: EM | Admit: 2009-11-20 | Discharge: 2009-11-20 | Payer: Self-pay | Admitting: Emergency Medicine

## 2009-11-20 ENCOUNTER — Encounter: Payer: Self-pay | Admitting: Internal Medicine

## 2009-11-25 ENCOUNTER — Encounter (INDEPENDENT_AMBULATORY_CARE_PROVIDER_SITE_OTHER): Payer: Self-pay

## 2009-12-05 ENCOUNTER — Emergency Department (HOSPITAL_COMMUNITY): Admission: EM | Admit: 2009-12-05 | Discharge: 2009-12-05 | Payer: Self-pay | Admitting: Emergency Medicine

## 2009-12-16 ENCOUNTER — Emergency Department (HOSPITAL_COMMUNITY): Admission: EM | Admit: 2009-12-16 | Discharge: 2009-12-16 | Payer: Self-pay | Admitting: Emergency Medicine

## 2009-12-16 IMAGING — CR DG CHEST 1V PORT
1 series · 1 of 1 positions shown · non-contrast
Comparison: That [DATE]

CLINICAL DATA: Chest pain.

PORTABLE CHEST - 1 VIEW

[view not recorded]
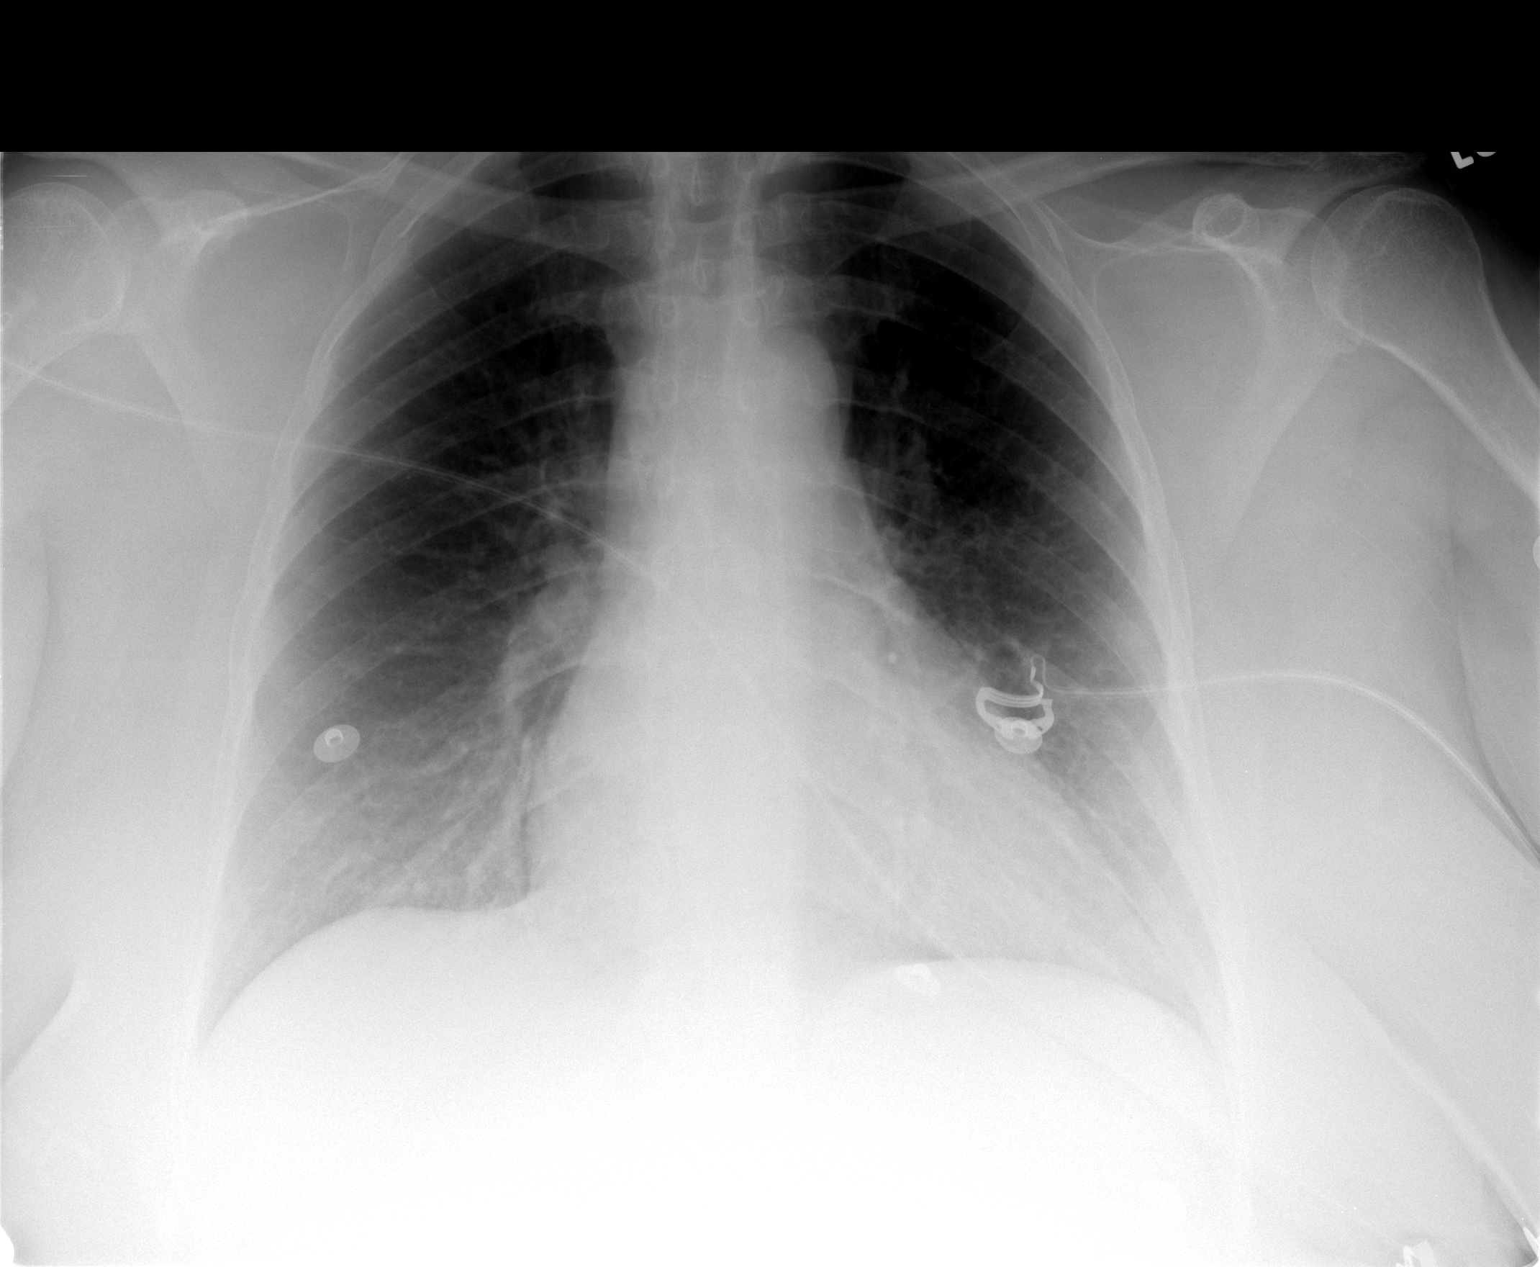

[1 of 1 positions shown; findings below may reference images not displayed]

FINDINGS: Heart is borderline in size.  Lungs are clear.  No
effusions or acute bony abnormality.
IMPRESSION: No acute cardiopulmonary disease.

## 2010-08-13 ENCOUNTER — Emergency Department (HOSPITAL_COMMUNITY): Admission: EM | Admit: 2010-08-13 | Discharge: 2010-08-14 | Payer: Self-pay | Admitting: Emergency Medicine

## 2010-08-13 IMAGING — CT CT ABD-PELV W/O CM
2 of 4 series · 17 of 46 positions shown, 19 images · non-contrast
Comparison: None.

CLINICAL DATA: 59-year-old with left flank pain and hematuria.

CT ABDOMEN AND PELVIS WITHOUT CONTRAST
TECHNIQUE: Multidetector CT imaging of the abdomen and pelvis was
performed following the standard protocol without intravenous
contrast.

[Series 2: standard/full over (age)lbs 5.0 · axial · 0.80mm/px · z∈[-468,-43]mm · 14 of 93 slices shown, 16 images]
[im 4/93  soft-tissue]
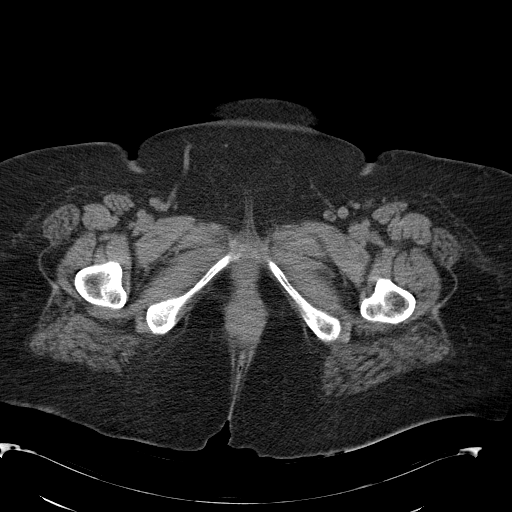
[im 4/93  bone]
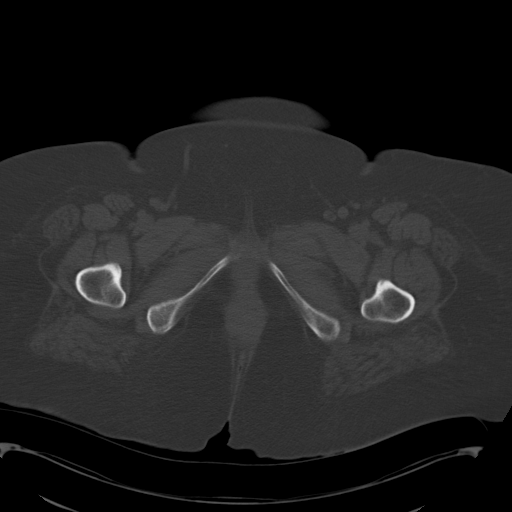
[im 12/93  soft-tissue]
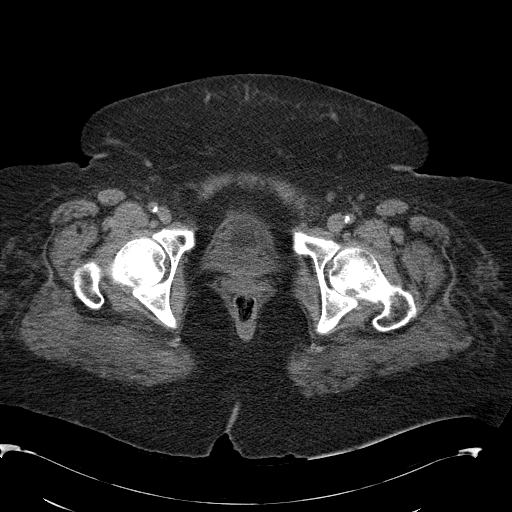
[im 19/93  soft-tissue]
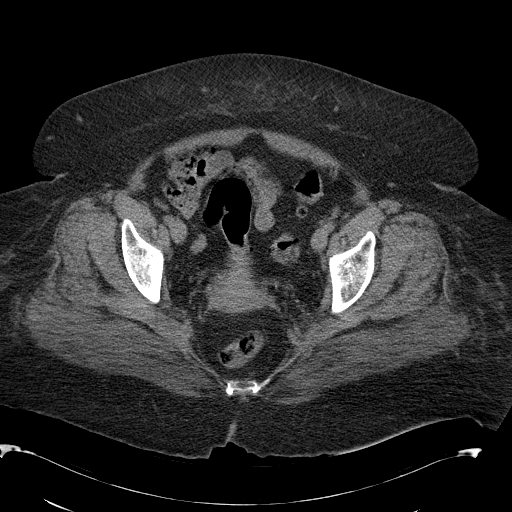
[im 26/93  soft-tissue]
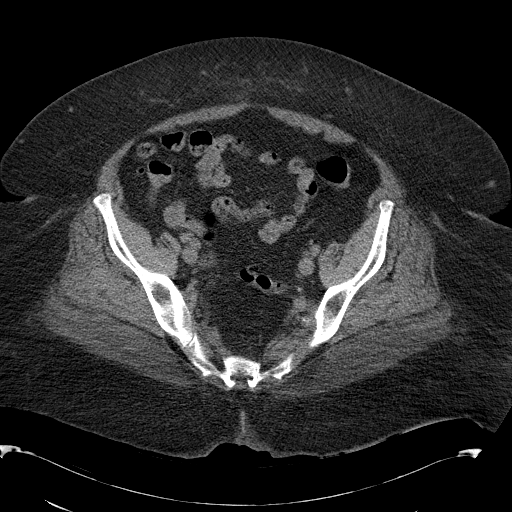
[im 30/93  soft-tissue]
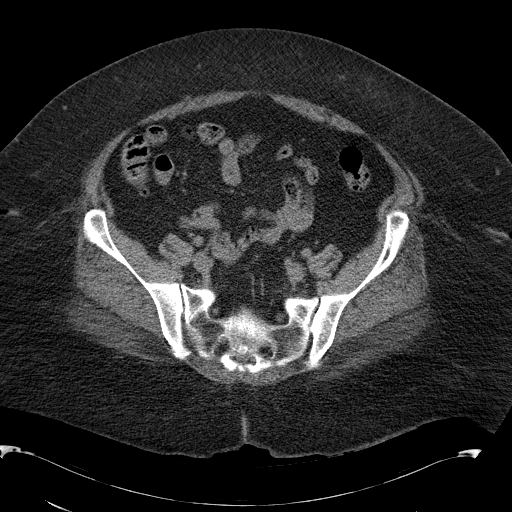
[im 37/93  soft-tissue]
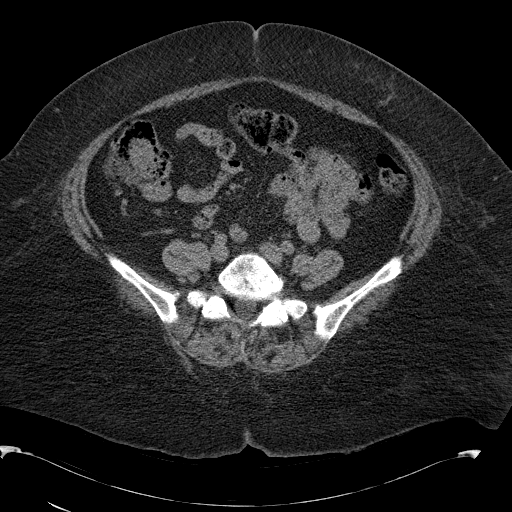
[im 45/93  soft-tissue]
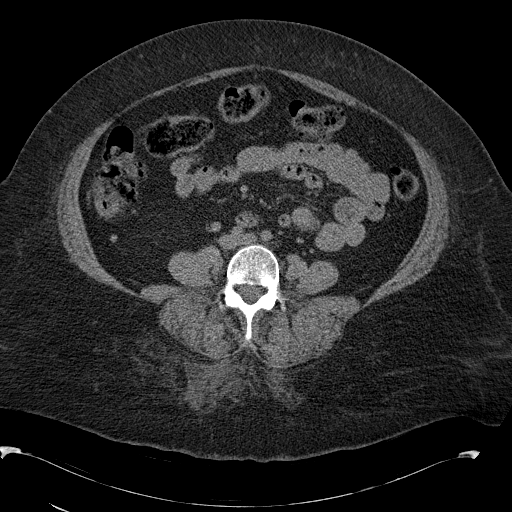
[im 48/93  soft-tissue]
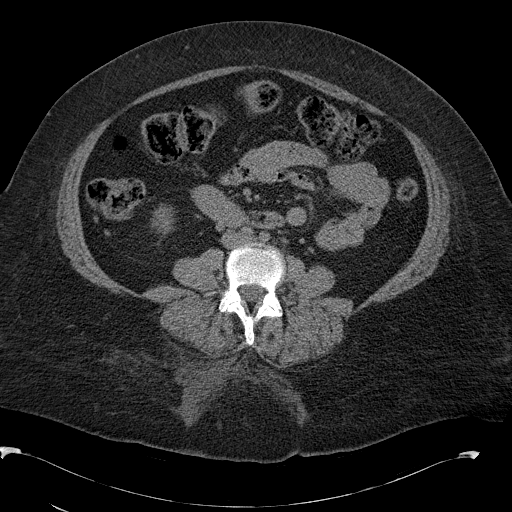
[im 56/93  soft-tissue]
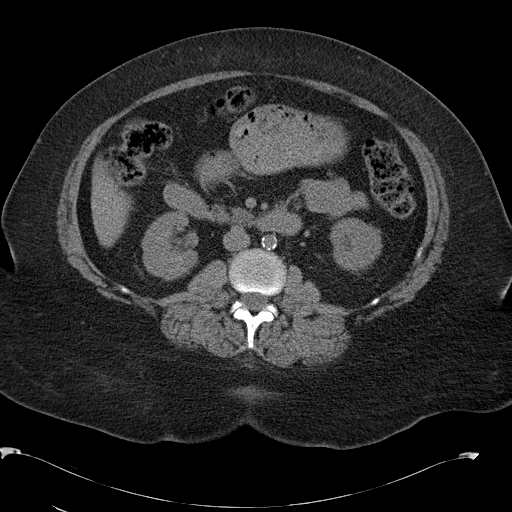
[im 56/93  bone]
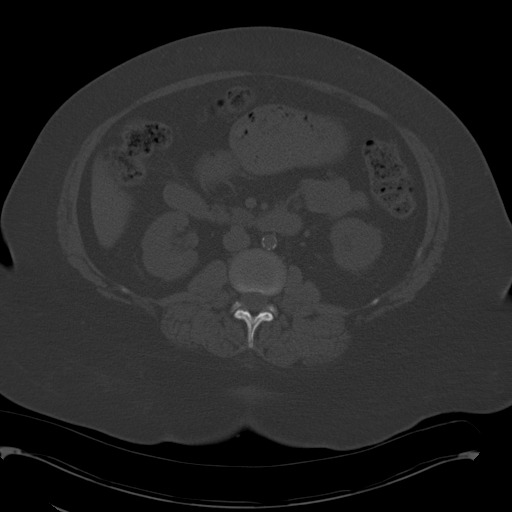
[im 63/93  soft-tissue]
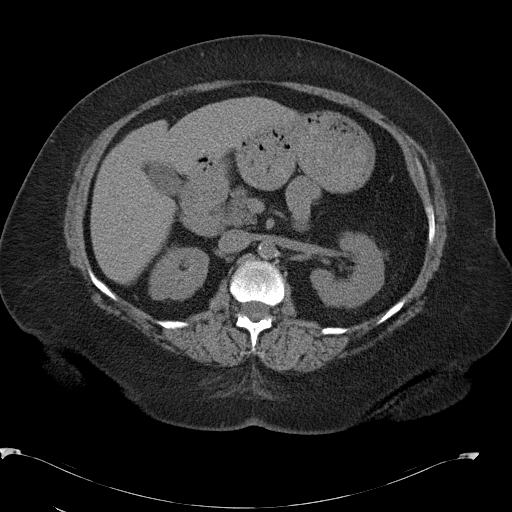
[im 70/93  soft-tissue]
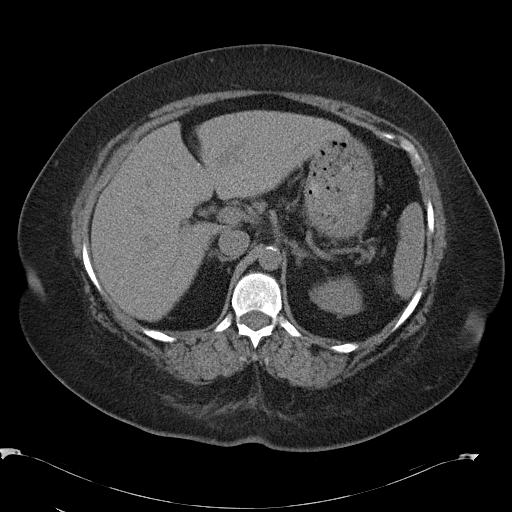
[im 74/93  soft-tissue]
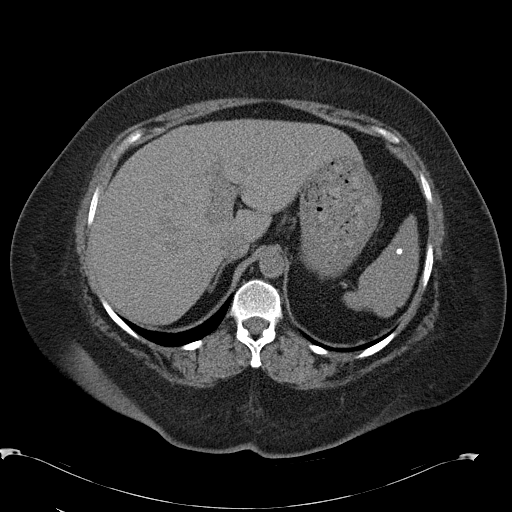
[im 81/93  soft-tissue]
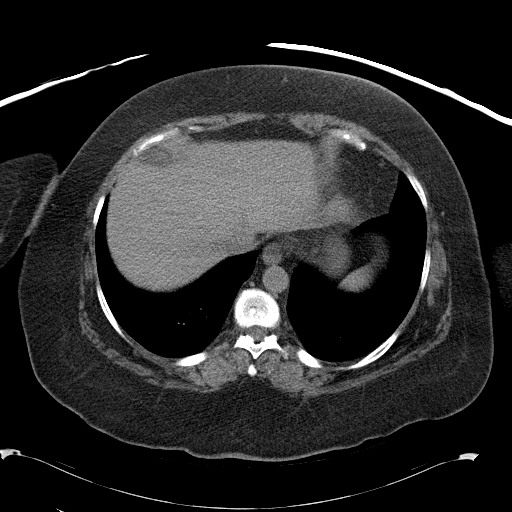
[im 89/93  soft-tissue]
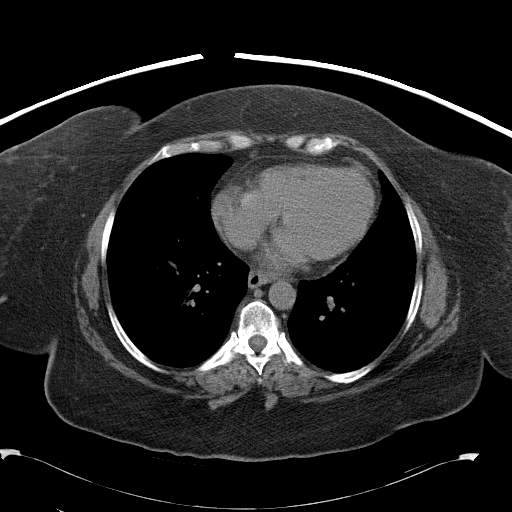

[Series 4: mpr coronal · coronal · 0.84mm/px · 3 of 85 slices shown]
[im 29/85  soft-tissue]
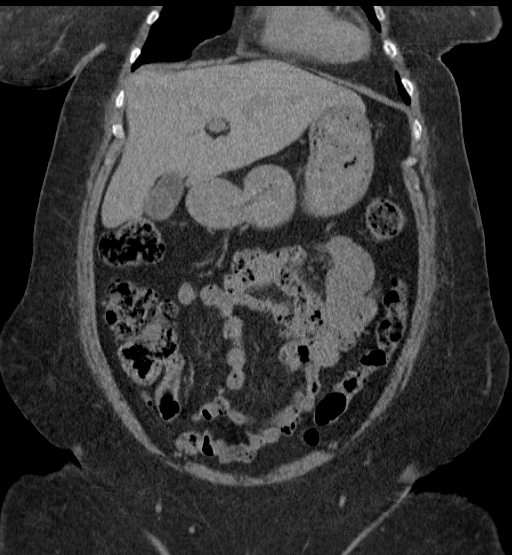
[im 38/85  soft-tissue]
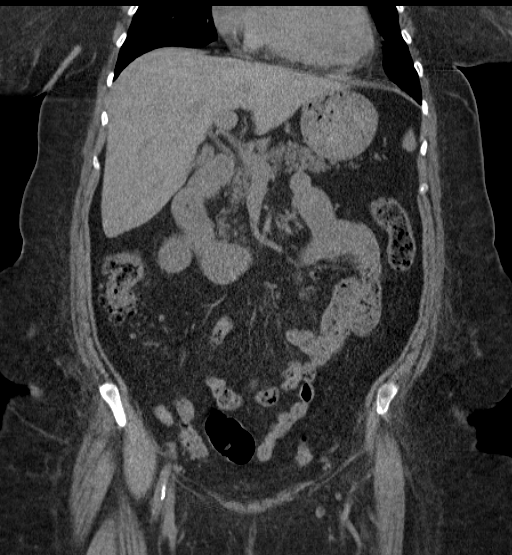
[im 47/85  soft-tissue]
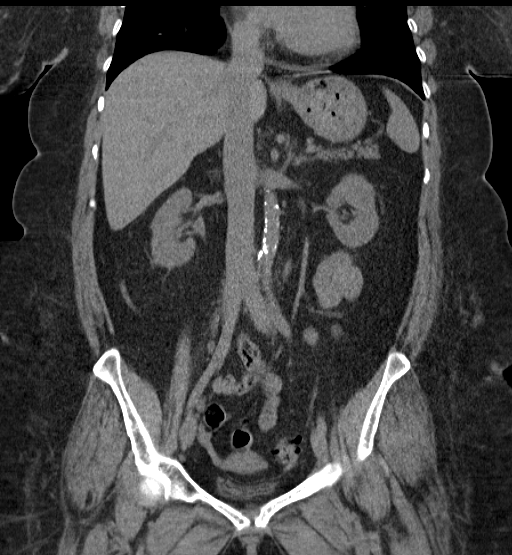

[17 of 46 positions shown; findings below may reference images not displayed]

FINDINGS: Lung bases are clear. Unenhanced CT was performed per
clinician order.  Lack of IV contrast limits sensitivity and
specificity, especially for evaluation of abdominal/pelvic solid
viscera. No evidence of free air.  No gross abnormality to the
liver, gallbladder, spleen, adrenal tissue, pancreas or kidneys.
No evidence for kidney stones or hydronephrosis.  The appendix is
normal.  A few colonic diverticula in the left lower quadrant
without acute inflammatory changes.  No gross abnormality to the
uterus or adnexal tissue.  No significant free fluid or
lymphadenopathy.  Degenerative changes in the lower lumbar spine.
IMPRESSION: Negative CT of the abdomen and pelvis.  No evidence for kidney
stones.

## 2010-11-25 ENCOUNTER — Emergency Department (HOSPITAL_COMMUNITY)
Admission: EM | Admit: 2010-11-25 | Discharge: 2010-11-25 | Payer: Self-pay | Source: Home / Self Care | Admitting: Emergency Medicine

## 2010-11-25 IMAGING — CR DG ABDOMEN ACUTE W/ 1V CHEST
5 series · 5 of 5 positions shown · non-contrast
Comparison: Chest radiograph [DATE]

CLINICAL DATA: Nausea, vomiting, diarrhea, diabetes, smoker

ACUTE ABDOMEN SERIES (ABDOMEN 2 VIEW & CHEST 1 VIEW)

[view not recorded (1 of 5)]
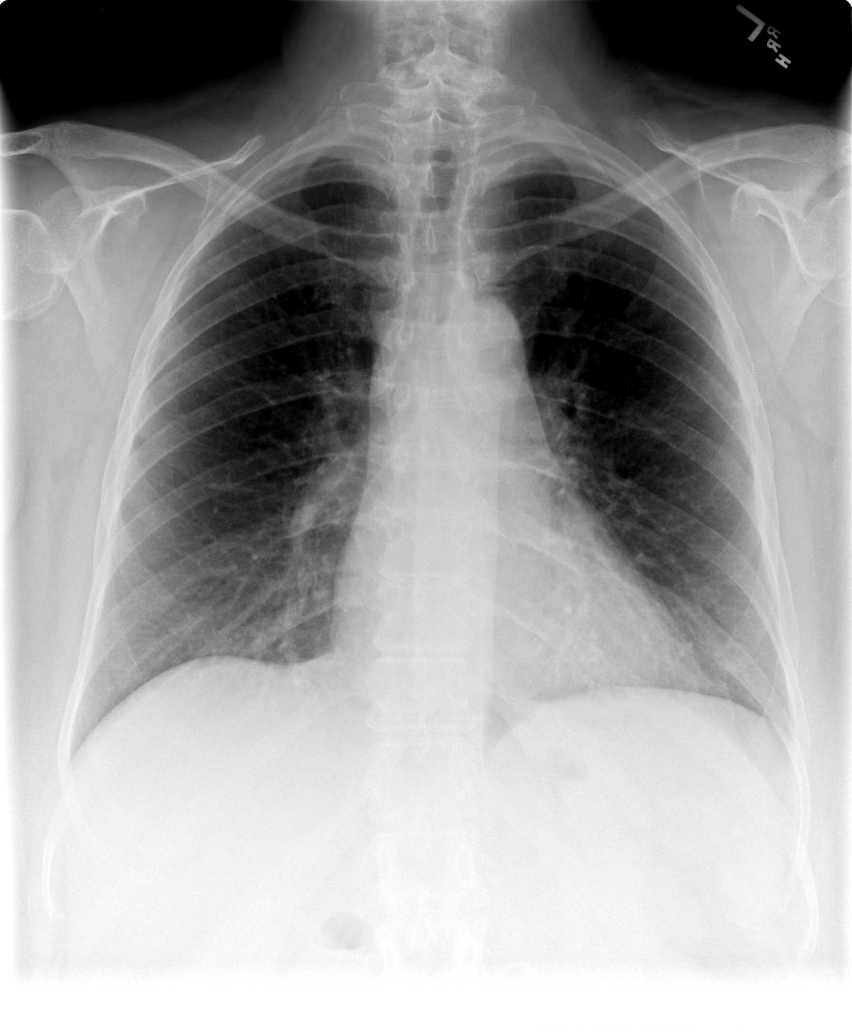

[view not recorded (2 of 5)]
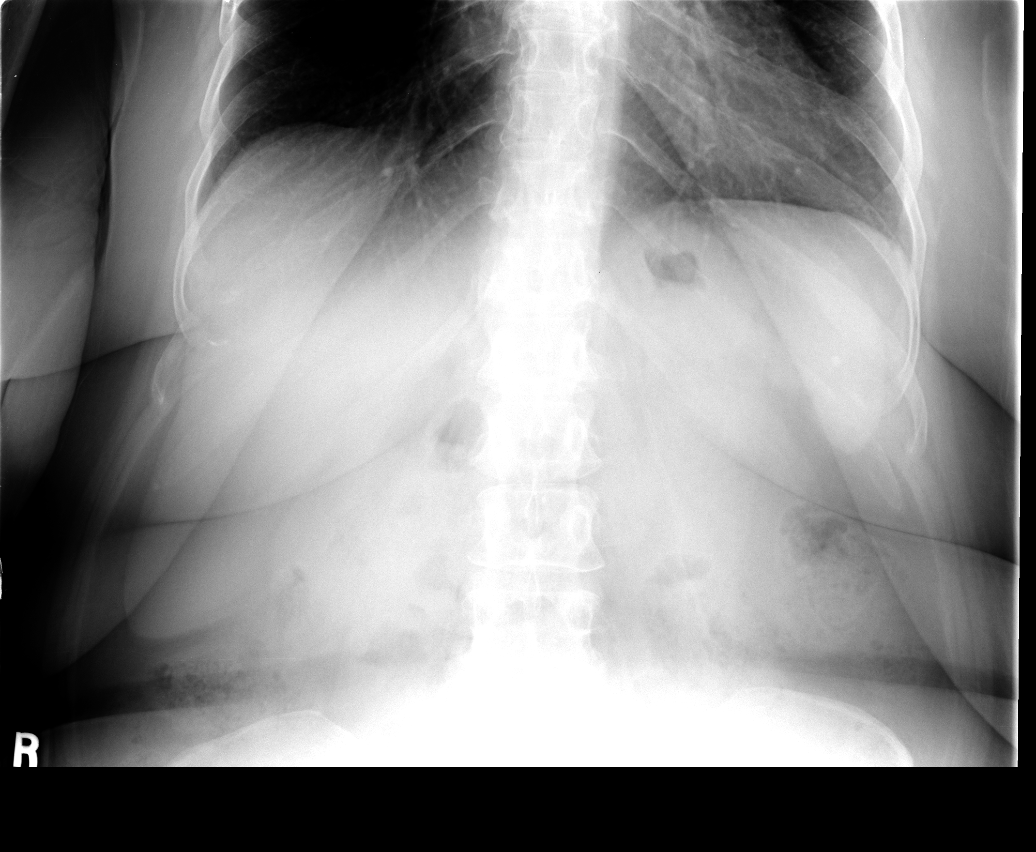

[view not recorded (3 of 5)]
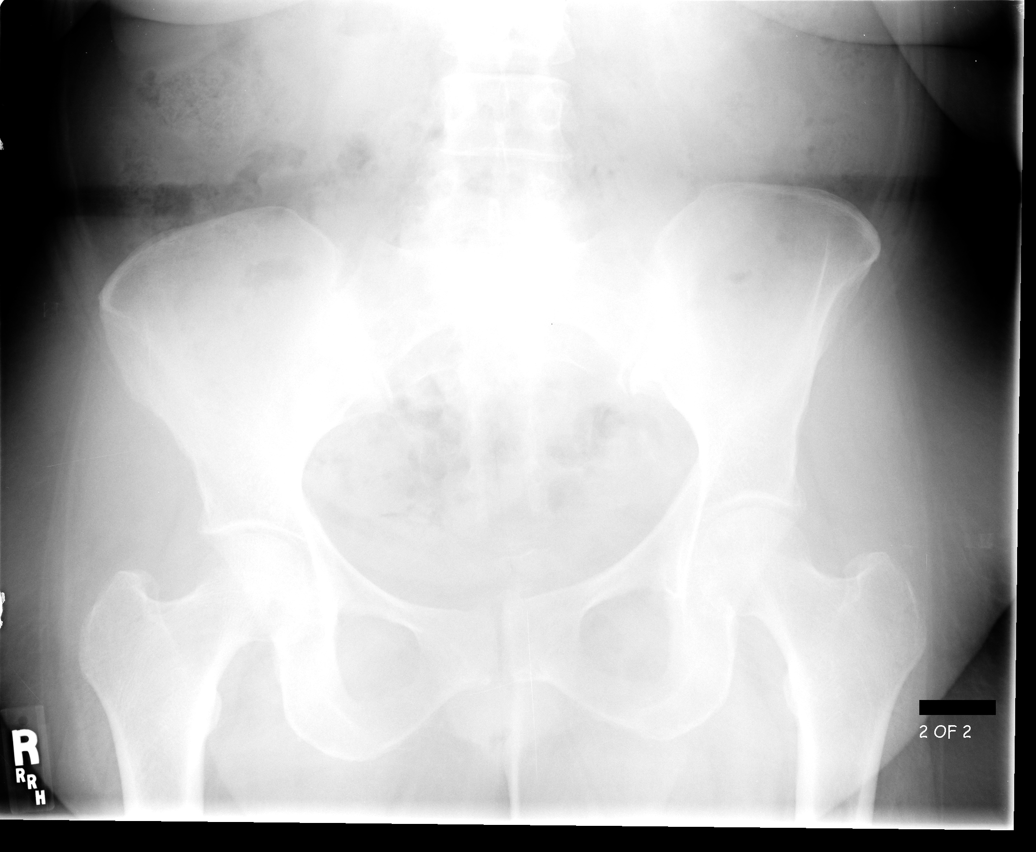

[view not recorded (4 of 5)]
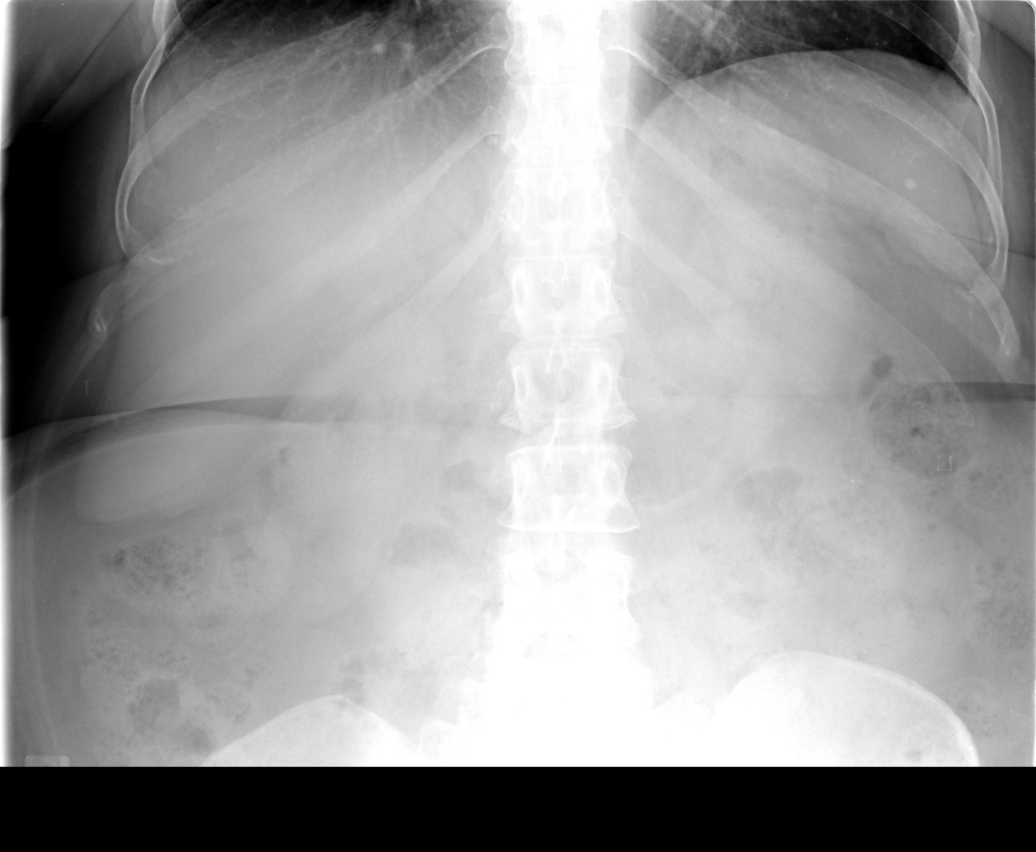

[view not recorded (5 of 5)]
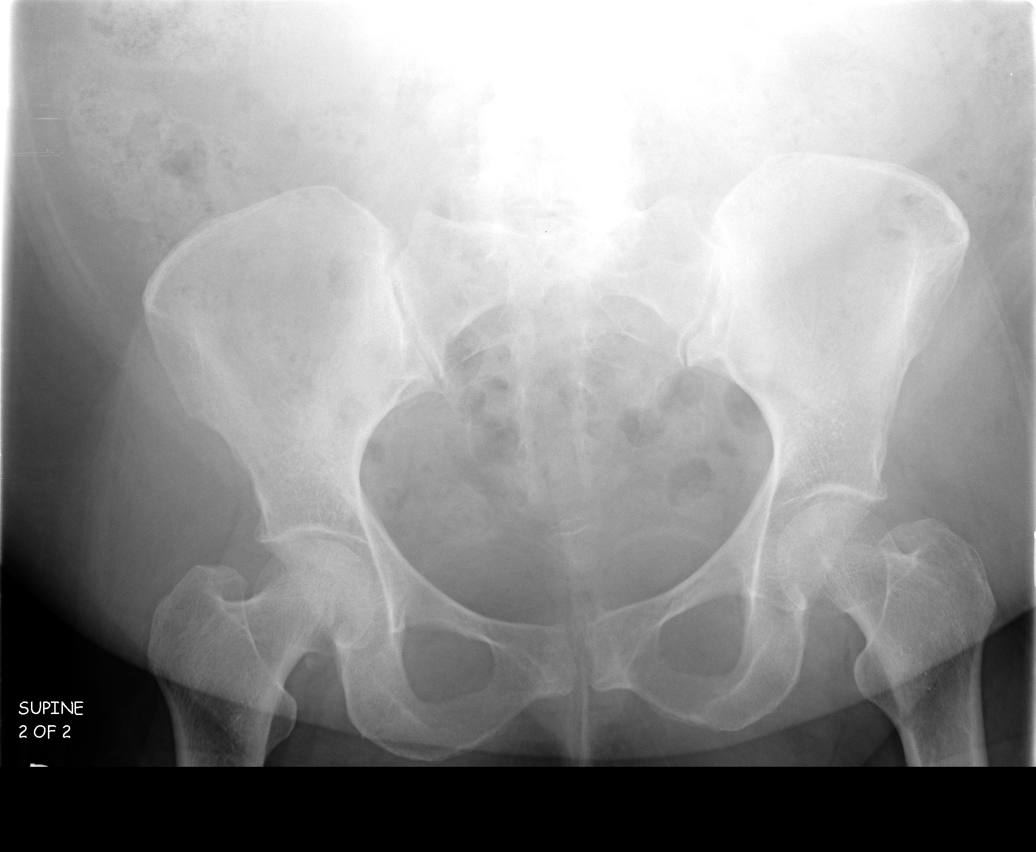

[5 of 5 positions shown; findings below may reference images not displayed]

FINDINGS: Upper normal-size of cardiac silhouette.
Mediastinal contours and pulmonary vascularity normal.
Lungs clear.
Nonobstructive bowel gas pattern.
Probable calcified granuloma in spleen.
No bowel dilatation, bowel wall thickening, or free intraperitoneal
air.
Bones appear diffusely demineralized.
No definite urinary tract calcification.
IMPRESSION: No acute abnormalities.

## 2010-11-28 ENCOUNTER — Encounter: Payer: Self-pay | Admitting: Internal Medicine

## 2010-11-30 LAB — URINALYSIS, ROUTINE W REFLEX MICROSCOPIC
Bilirubin Urine: NEGATIVE
Hgb urine dipstick: NEGATIVE
Ketones, ur: >80 mg/dL — AB
Leukocytes, UA: NEGATIVE
Nitrite: POSITIVE — AB
Protein, ur: NEGATIVE mg/dL
Specific Gravity, Urine: 1.015 (ref 1.005–1.030)
Urine Glucose, Fasting: >1000 mg/dL — AB
Urobilinogen, UA: 0.2 mg/dL (ref 0.0–1.0)
pH: 5.5 (ref 5.0–8.0)

## 2010-11-30 LAB — BASIC METABOLIC PANEL
BUN: 7 mg/dL (ref 6–23)
CO2: 21 mEq/L (ref 19–32)
Calcium: 9.2 mg/dL (ref 8.4–10.5)
Chloride: 94 mEq/L — ABNORMAL LOW (ref 96–112)
Creatinine, Ser: 1 mg/dL (ref 0.4–1.2)
GFR calc Af Amer: >60 mL/min (ref >60)
GFR calc non Af Amer: 57 mL/min — ABNORMAL LOW (ref >60)
Glucose, Bld: 514 mg/dL — ABNORMAL HIGH (ref 70–99)
Potassium: 3.8 mEq/L (ref 3.5–5.1)
Sodium: 131 mEq/L — ABNORMAL LOW (ref 135–145)

## 2010-11-30 LAB — GLUCOSE, CAPILLARY
Glucose-Capillary: 544 mg/dL — ABNORMAL HIGH (ref 70–99)
Glucose-Capillary: 343 mg/dL — ABNORMAL HIGH (ref 70–99)
Glucose-Capillary: 167 mg/dL — ABNORMAL HIGH (ref 70–99)
Glucose-Capillary: 165 mg/dL — ABNORMAL HIGH (ref 70–99)

## 2010-11-30 LAB — CBC
HCT: 42.6 % (ref 36.0–46.0)
Hemoglobin: 15.2 g/dL — ABNORMAL HIGH (ref 12.0–15.0)
MCH: 31 pg (ref 26.0–34.0)
MCHC: 35.7 g/dL (ref 30.0–36.0)
MCV: 86.8 fL (ref 78.0–100.0)
Platelets: 194 10*3/uL (ref 150–400)
RBC: 4.91 MIL/uL (ref 3.87–5.11)
RDW: 14.2 % (ref 11.5–15.5)
WBC: 11 10*3/uL — ABNORMAL HIGH (ref 4.0–10.5)

## 2010-11-30 LAB — URINE MICROSCOPIC-ADD ON

## 2010-11-30 LAB — DIFFERENTIAL
Basophils Absolute: 0 10*3/uL (ref 0.0–0.1)
Basophils Relative: 0 % (ref 0–1)
Eosinophils Absolute: 0.1 10*3/uL (ref 0.0–0.7)
Eosinophils Relative: 1 % (ref 0–5)
Lymphocytes Relative: 25 % (ref 12–46)
Lymphs Abs: 2.7 10*3/uL (ref 0.7–4.0)
Monocytes Absolute: 0.7 10*3/uL (ref 0.1–1.0)
Monocytes Relative: 6 % (ref 3–12)
Neutro Abs: 7.5 10*3/uL (ref 1.7–7.7)
Neutrophils Relative %: 68 % (ref 43–77)

## 2010-11-30 LAB — HEPATIC FUNCTION PANEL
ALT: 28 U/L (ref 0–35)
AST: 29 U/L (ref 0–37)
Albumin: 3.9 g/dL (ref 3.5–5.2)
Alkaline Phosphatase: 114 U/L (ref 39–117)
Bilirubin, Direct: 0.2 mg/dL (ref 0.0–0.3)
Indirect Bilirubin: 1.3 mg/dL — ABNORMAL HIGH (ref 0.3–0.9)
Total Bilirubin: 1.5 mg/dL — ABNORMAL HIGH (ref 0.3–1.2)
Total Protein: 6.1 g/dL (ref 6.0–8.3)

## 2010-11-30 LAB — LIPASE, BLOOD: Lipase: 22 U/L (ref 11–59)

## 2010-12-08 NOTE — Assessment & Plan Note (Signed)
Summary: left ankle fx ap er 11/30/07/xr there /mcd/bsf   Vital Signs:  Patient Profile:   60 Years Old Female Weight:      280 pounds Pulse rate:   78 / minute Resp:     16 per minute  Vitals Entered By: Ether Griffins (November 30, 2007 2:19 PM)                 Chief Complaint:  left foot fracture.  History of Present Illness: I saw Claudia Keller in the office today for an initial visit.  She is a 60 years old woman with the complaint of:  left foot fracture.  Patient fell 11-29-07 and had xrays at [facility] which read Jones fracture, left 5th metatarsal base.  Pain level is an 8 today, with some swelling. She has bruising.  She was given vicodin from er yesterday and it is being delivered to her house as we speak.  She is also getting a walker delivered today also.  She has a splint on her foot, she walked in here today without crutches.    Current Allergies: No known allergies   Past Medical History:    High Blood Pressure    Diabetes    thyroid    mental illness    cholesterol high  Past Surgical History:    tubes tied   Family History:    Family History Coronary Heart Disease female < 54    Family History of Diabetes  Social History:    Patient is separated.    Risk Factors:  Tobacco use:  current    Cigarettes:  Yes -- 2 pack(s) per day Caffeine use:  0 drinks per day Alcohol use:  no  Family History Risk Factors:    Family History of MI in males < 70 years old:  yes   Review of Systems  Endo      Complains of thyroid disease and diabetes.  Psych      Complains of depression and anxiety.  EENT      Complains of poor vision.   Physical Exam  Msk:     antalgic gait.  obesity    Foot/Ankle Exam  General:    Well-developed, well-nourished,  no deformities, normal grooming.    Gait:    antalgic.    Skin:    Intact with no scars, lesions, rashes, cafe-au-lait spots or bruising.    Inspection:    Inspection reveals  no deformity, ecchymosis; mild swelling of the foot   Palpation:    5th MET tender   Vascular:    dorsalis pedis and posterior tibial pulses 2+ and symmetric, capillary refill < 2 seconds, normal hair pattern, no evidence of ischemia.   Sensory:    gross sensation intact bilaterally in lower extremities.    Motor:    Motor strength 5/5 bilaterally for ankle dorsiflexion, ankle plantar flexion, ankle inversion and ankle eversion.    Foot Exam:    Right:    Inspection:  Abnormal    Palpation:  Abnormal    Stability:  stable    Tenderness:  yes    Swelling:  yes    Erythema:  no    Impression & Recommendations:  Problem # 1:  CLOSED FRACTURE OF METATARSAL BONE (ICD-825.25) post op shoe for non dispalced proximal 5th metatarsal fracture  Orders: New Patient Level III ([address]109)   Medications Added to Medication List This Visit: 1)  Zetia  2)  Oxybutynin Chloride 5  Mg Tabs (Oxybutynin chloride) 3)  Paroxetine Hcl 10 Mg Tabs (Paroxetine hcl) 4)  Cetirizine Hcl 1 Mg/ml Syrp (Cetirizine hcl) 5)  Benicar  6)  Levoxyl  7)  Gabapentin  8)  Lorazepam  9)  Simvastatin  10)  Avandia    Patient Instructions: 1)  Please schedule a follow-up appointment if not better in 6 weeks     ]

## 2010-12-08 NOTE — Miscellaneous (Signed)
Summary: Fecal Lactoferrin/ 11/17/2009  Clinical Lists Changes Fecal-Lactoferrin - STATUS: Final  SEE NOTE.                                 Perform Date: 10Jan11 09:33  Ordered By: Jena Gauss MD , Gerrit Friends           Ordered Date: Lux.Amos 09:35  Facility: [facility]                               Department: [facility]  Service Report Text     SPECIMEN OBTAINED:            11/17/2009 09:33  SPECIMEN DESCRIPTION:         STOOL                                ENDO SPECIMEN  SPECIAL REQUESTS:             NONE  FECAL LACTOFERRIN:            NEGATIVE  REPORT STATUS:                FINAL                                11/18/2009

## 2010-12-08 NOTE — Letter (Signed)
Summary: TCS ORDER  TCS ORDER   Imported By: Ave Filter 11/14/2009 13:09:16  _____________________________________________________________________  External Attachment:    Type:   Image     Comment:   External Document

## 2010-12-08 NOTE — Miscellaneous (Signed)
Summary: OVA and Parasites/ 11/17/2009  Clinical Lists Changes   Ova and Parasite Exam - STATUS: Final  SEE NOTE.                                 Perform Date: 10Jan11 09:33  Ordered By: Jena Gauss MD , Gerrit Friends           Ordered Date: Lux.Amos 09:33  Facility: [facility]                               Department: [facility]  Service Report Text     SPECIMEN OBTAINED:            11/17/2009 09:33  SPECIMEN DESCRIPTION:         STOOL                                ENDO SPECIMEN  SPECIAL REQUESTS:             NONE  OVA AND PARASITES:            NO OVA OR PARASITES SEEN  REPORT STATUS:                FINAL                                11/19/2009  Additional Information  HL7 RESULT STATUS : F  External IF Update Timestamp : 2009-11-19:11:07:00.000000

## 2010-12-08 NOTE — Miscellaneous (Signed)
Summary: Stool Culture/11/17/2009  Clinical Lists Changes  Culture, Stool(Salm/Shig/Campy&ECO157) - STATUS: Prelim  SEE NOTE.                                 Perform Date: 10Jan11 09:33  Ordered By: Jena Gauss MD , Gerrit Friends           Ordered Date: Lux.Amos 09:33  Facility: [facility]                               Department: [facility]  Service Report Text     SPECIMEN OBTAINED:            11/17/2009 09:33  SPECIMEN DESCRIPTION:         STOOL                                ENDO SPECIMEN  SPECIAL REQUESTS:             NONE  CULTURE:                      NO SUSPICIOUS COLONIES, CONTINUING TO HOLD                                Performed at [facility] [address]  REPORT STATUS:                PRELIMINARY  Additional Information  HL7 RESULT STATUS : P  External IF Update Timestamp : 2009-11-19:11:14:00.000000   Appended Document: Stool Culture/11/17/2009 stool studies negative  Appended Document: Stool Culture/11/17/2009 tried to call pt- LMOM  Appended Document: Stool Culture/11/17/2009 tried to call pt- LMOM  Appended Document: Stool Culture/11/17/2009 mailed pt letter

## 2010-12-08 NOTE — Assessment & Plan Note (Signed)
Summary: CHANGE IN BOWEL HABITS, CONSULT FOR TCS/CM   Visit Type:  Initial Consult Referring Provider:  Fanta Primary Care Provider:  Fanta  Chief Complaint:  Change in bowel habits.  History of Present Illness: 60 year old morbidly obese lady referred by Dr.Fanta for consideration colonoscopy. She gives a history of having polyps removed some 13 years ago in [place],  [place].  She has chronic diarrhea; she states about 60% time she has multiple nonbloody bowel movements daily sometimes has episodes of incontinence. She has history of type 2 diabetes.   She has been using pepto-bismal as well as Imodium with some improvement.  There is no family history of colon cancer,  colon polyps or inflammatory bowel disease.  She denies rectal bleeding or melena. She denies a upper GI tract symptoms such as odynaphagia,  dysphagia early satiety reflux symptoms of nausea or vomiting. In addition, patient has used Sustenex which she feels has ameliorated her diarrhea to a noticeable degree.  Current Medications (verified): 1)  Paroxetine Hcl 20 Mg  Tabs (Paroxetine Hcl) .... One Tablet in The Am and Two Tablets At Bedtime. 2)  Levoxyl 112 Mcg .... Take 1 Tablet By Mouth Once A Day 3)  Gabapentin 100 Mg .... Take 1 Tablet By Mouth Three Times A Day 4)  Lorazepam 0.5 Mg .... Take 1 Tablet By Mouth Four Times A Day 5)  Simvastatin 40 Mg .... Take 1 Tablet By Mouth Once A Day 6)  Cetirizine Hcl 10 Mg Tabs (Cetirizine Hcl) .... Take 1 Tablet By Mouth Once A Day 7)  Actos 45 Mg Tabs (Pioglitazone Hcl) .... Take 1 Tablet By Mouth Once A Day 8)  Enablex 7.5 Mg Xr24h-Tab (Darifenacin Hydrobromide) .... Take 1 Tablet By Mouth Once A Day 9)  Furosemide 20 Mg Tabs (Furosemide) .... Take 1 Tablet By Mouth Once A Day 10)  Tussin Otc Cough Syrup .... As Needed 11)  Sustenex .... One Daily 12)  Albuterol Inhaler .... As Needed  Allergies (verified): No Known Drug Allergies  Past History:  Past Medical  History: Last updated: 11/30/2007 High Blood Pressure Diabetes thyroid mental illness cholesterol high  Past Surgical History: Last updated: 11/30/2007 tubes tied  Family History: Last updated: 11/30/2007 Family History Coronary Heart Disease female < 75 Family History of Diabetes  Social History: Last updated: 11/30/2007 Patient is separated.   Risk Factors: Caffeine Use: 0 (11/30/2007)  Risk Factors: Smoking Status: current (11/30/2007) Packs/Day: 2 (11/30/2007)  Vital Signs:  Patient profile:   60 year old female Height:      67 inches Weight:      283 pounds BMI:     44.48 Temp:     97.5 degrees F oral Pulse rate:   80 / minute BP sitting:   140 / 70  (left arm) Cuff size:   large  Vitals Entered By: Cloria Spring LPN (November 11, 2009 3:27 PM)  Physical Exam  General:  alert conversant no acute distress Eyes:  no scleral icterus conjunctivae are pink Lungs:  clear to auscultation Heart:  regular and rhythm without murmur gallop or Abdomen:  massively obese. Positive bowel sounds soft nontender no obvious mass or organomegaly  Impression & Recommendations: Impression:  Morbidly obese 60 year old lady with a history of distant history of colonic polyps. Now with intermittent diarrhea. I agree with Dr .Felecia Shelling she should  have a colonoscopy at minimum for polyp surveillance/screening. She may well have irritable bowel syndrome. However, the possibility of collagenous colitis or  diabetic enteral visceral neuropathy is are also possibilities. Also,  celiac disease would be an outside possibility(although less likely.  Recommendations: Diagnostic colonoscopy the very near future. Risks, benefits, limitations, alternatives reviewed with Ms. Rada. Her questions have been answered. She is agreeable. Further recommendations once colonoscopy has been performed.  I want thank Dr.Fanta his kind referral.      Appended Document: Orders Update-charge    Clinical  Lists Changes  Problems: Added new problem of COLONIC POLYPS, HX OF (ICD-V12.72) Added new problem of OTHER SYMPTOMS INVOLVING DIGESTIVE SYSTEM OTHER (ICD-787.99) Added new problem of IRRITABLE BOWEL SYNDROME (ICD-564.1) Added new problem of DIARRHEA (ICD-787.91) Orders: Added new Service order of Consultation Level IV [phone]) - Signed

## 2010-12-08 NOTE — Miscellaneous (Signed)
Summary: C-Diff//08/2010  Clinical Lists Changes  Clostridium Difficile Tox A/B (EIA)-Fecl - STATUS: Final  SEE NOTE.                                 Perform Date: 10Jan11 09:33  Ordered By: Jena Gauss MD , Gerrit Friends           Ordered Date: Lux.Amos 09:33  Facility: [facility]                               Department: [facility]  Service Report Text     SPECIMEN OBTAINED:            11/17/2009 09:33  SPECIMEN DESCRIPTION:         STOOL                                ENDO SPECIMEN  SPECIAL REQUESTS:             NONE  C DIFF TOXIN A and B:         NEGATIVE  REPORT STATUS:                FINAL                                11/18/2009  Additional Information  HL7 RESULT STATUS : F  External IF Update Timestamp : 2009-11-18:04:54:00.000000

## 2010-12-08 NOTE — Letter (Signed)
Summary: Normal Results Letter  [facility]  [address]   [place], [place] [address]   Phone: [phone]  Fax: [phone]    November 25, 2009  LAELIA ANGELO [facility] [address] [place], [place]  [address] 06-01-1951   Dear Ms. Dressel,   Our office has been trying to contact you.  We just wanted to inform you that all of your stool studies that were done during your procedure- were normal.  If you have any questions, please call the office at [phone].   Thank you,    Hendricks Limes, LPN Cloria Spring, LPN  [facility] Ph: [phone]   Fax: [phone]

## 2011-01-20 LAB — URINALYSIS, ROUTINE W REFLEX MICROSCOPIC
Bilirubin Urine: NEGATIVE
Glucose, UA: NEGATIVE mg/dL
Hgb urine dipstick: NEGATIVE
Nitrite: NEGATIVE
Protein, ur: NEGATIVE mg/dL
Specific Gravity, Urine: >1.03 — ABNORMAL HIGH (ref 1.005–1.030)
Urobilinogen, UA: 1 mg/dL (ref 0.0–1.0)
pH: 6 (ref 5.0–8.0)

## 2011-01-20 LAB — POCT I-STAT, CHEM 8
BUN: 14 mg/dL (ref 6–23)
Calcium, Ion: 1.15 mmol/L (ref 1.12–1.32)
Chloride: 106 mEq/L (ref 96–112)
Creatinine, Ser: 0.9 mg/dL (ref 0.4–1.2)
Glucose, Bld: 114 mg/dL — ABNORMAL HIGH (ref 70–99)
HCT: 44 % (ref 36.0–46.0)
Hemoglobin: 15 g/dL (ref 12.0–15.0)
Potassium: 3.9 mEq/L (ref 3.5–5.1)
Sodium: 143 mEq/L (ref 135–145)
TCO2: 28 mmol/L (ref 0–100)

## 2011-01-20 LAB — URINE CULTURE
Colony Count: >=100000
Culture  Setup Time: 201110072117

## 2011-01-23 LAB — FECAL LACTOFERRIN, QUANT: Fecal Lactoferrin: NEGATIVE

## 2011-01-23 LAB — STOOL CULTURE

## 2011-01-23 LAB — CLOSTRIDIUM DIFFICILE EIA: C difficile Toxins A+B, EIA: NEGATIVE

## 2011-01-23 LAB — OVA AND PARASITE EXAMINATION: Ova and parasites: NONE SEEN

## 2011-01-23 LAB — GLUCOSE, CAPILLARY: Glucose-Capillary: 112 mg/dL — ABNORMAL HIGH (ref 70–99)

## 2011-01-27 LAB — CBC
HCT: 39.9 % (ref 36.0–46.0)
Hemoglobin: 13.5 g/dL (ref 12.0–15.0)
MCHC: 33.8 g/dL (ref 30.0–36.0)
MCV: 93.4 fL (ref 78.0–100.0)
Platelets: 185 10*3/uL (ref 150–400)
RBC: 4.27 MIL/uL (ref 3.87–5.11)
RDW: 16.1 % — ABNORMAL HIGH (ref 11.5–15.5)
WBC: 6.2 10*3/uL (ref 4.0–10.5)

## 2011-01-27 LAB — COMPREHENSIVE METABOLIC PANEL
ALT: 18 U/L (ref 0–35)
AST: 21 U/L (ref 0–37)
Albumin: 3.7 g/dL (ref 3.5–5.2)
Alkaline Phosphatase: 81 U/L (ref 39–117)
BUN: 9 mg/dL (ref 6–23)
CO2: 30 mEq/L (ref 19–32)
Calcium: 9.3 mg/dL (ref 8.4–10.5)
Chloride: 105 mEq/L (ref 96–112)
Creatinine, Ser: 1.16 mg/dL (ref 0.4–1.2)
GFR calc Af Amer: 58 mL/min — ABNORMAL LOW (ref >60)
GFR calc non Af Amer: 48 mL/min — ABNORMAL LOW (ref >60)
Glucose, Bld: 183 mg/dL — ABNORMAL HIGH (ref 70–99)
Potassium: 3.8 mEq/L (ref 3.5–5.1)
Sodium: 140 mEq/L (ref 135–145)
Total Bilirubin: 0.8 mg/dL (ref 0.3–1.2)
Total Protein: 6.4 g/dL (ref 6.0–8.3)

## 2011-01-27 LAB — DIFFERENTIAL
Basophils Absolute: 0 10*3/uL (ref 0.0–0.1)
Basophils Relative: 0 % (ref 0–1)
Eosinophils Absolute: 0.1 10*3/uL (ref 0.0–0.7)
Eosinophils Relative: 1 % (ref 0–5)
Lymphocytes Relative: 36 % (ref 12–46)
Lymphs Abs: 2.2 10*3/uL (ref 0.7–4.0)
Monocytes Absolute: 0.7 10*3/uL (ref 0.1–1.0)
Monocytes Relative: 11 % (ref 3–12)
Neutro Abs: 3.3 10*3/uL (ref 1.7–7.7)
Neutrophils Relative %: 52 % (ref 43–77)

## 2011-01-27 LAB — POCT CARDIAC MARKERS
CKMB, poc: 1.1 ng/mL (ref 1.0–8.0)
CKMB, poc: <1 ng/mL — ABNORMAL LOW (ref 1.0–8.0)
Myoglobin, poc: 81.8 ng/mL (ref 12–200)
Myoglobin, poc: 84.6 ng/mL (ref 12–200)
Troponin i, poc: <0.05 ng/mL (ref 0.00–0.09)
Troponin i, poc: <0.05 ng/mL (ref 0.00–0.09)

## 2011-02-22 LAB — CBC
HCT: 43.2 % (ref 36.0–46.0)
Hemoglobin: 14.3 g/dL (ref 12.0–15.0)
MCHC: 33.2 g/dL (ref 30.0–36.0)
MCV: 92.4 fL (ref 78.0–100.0)
Platelets: 213 10*3/uL (ref 150–400)
RBC: 4.68 MIL/uL (ref 3.87–5.11)
RDW: 15.6 % — ABNORMAL HIGH (ref 11.5–15.5)
WBC: 8.4 10*3/uL (ref 4.0–10.5)

## 2011-02-22 LAB — ETHANOL: Alcohol, Ethyl (B): <5 mg/dL (ref 0–10)

## 2011-02-22 LAB — DIFFERENTIAL
Basophils Absolute: 0 10*3/uL (ref 0.0–0.1)
Basophils Relative: 1 % (ref 0–1)
Eosinophils Absolute: 0.1 10*3/uL (ref 0.0–0.7)
Eosinophils Relative: 1 % (ref 0–5)
Lymphocytes Relative: 27 % (ref 12–46)
Lymphs Abs: 2.3 10*3/uL (ref 0.7–4.0)
Monocytes Absolute: 0.7 10*3/uL (ref 0.1–1.0)
Monocytes Relative: 8 % (ref 3–12)
Neutro Abs: 5.3 10*3/uL (ref 1.7–7.7)
Neutrophils Relative %: 63 % (ref 43–77)

## 2011-02-22 LAB — RAPID URINE DRUG SCREEN, HOSP PERFORMED
Amphetamines: NOT DETECTED
Barbiturates: NOT DETECTED
Benzodiazepines: POSITIVE — AB
Cocaine: NOT DETECTED
Opiates: NOT DETECTED
Tetrahydrocannabinol: NOT DETECTED

## 2011-02-22 LAB — BASIC METABOLIC PANEL
BUN: 17 mg/dL (ref 6–23)
CO2: 23 mEq/L (ref 19–32)
Calcium: 9.8 mg/dL (ref 8.4–10.5)
Chloride: 103 mEq/L (ref 96–112)
Creatinine, Ser: 1.1 mg/dL (ref 0.4–1.2)
GFR calc Af Amer: >60 mL/min (ref >60)
GFR calc non Af Amer: 51 mL/min — ABNORMAL LOW (ref >60)
Glucose, Bld: 253 mg/dL — ABNORMAL HIGH (ref 70–99)
Potassium: 3.9 mEq/L (ref 3.5–5.1)
Sodium: 139 mEq/L (ref 135–145)

## 2011-02-22 LAB — ACETAMINOPHEN LEVEL: Acetaminophen (Tylenol), Serum: <10 ug/mL — ABNORMAL LOW (ref 10–30)
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
## 2011-05-31 ENCOUNTER — Encounter: Payer: Self-pay | Admitting: Cardiology

## 2011-06-02 ENCOUNTER — Encounter (HOSPITAL_COMMUNITY): Payer: Self-pay | Admitting: Emergency Medicine

## 2011-06-02 ENCOUNTER — Emergency Department (HOSPITAL_COMMUNITY)
Admission: EM | Admit: 2011-06-02 | Discharge: 2011-06-02 | Disposition: A | Discharge status: Home / Self Care | Payer: Medicaid Other | Attending: Emergency Medicine | Admitting: Emergency Medicine

## 2011-06-02 DIAGNOSIS — R6883 Chills (without fever): Secondary | ICD-10-CM | POA: Insufficient documentation

## 2011-06-02 DIAGNOSIS — F319 Bipolar disorder, unspecified: Secondary | ICD-10-CM | POA: Insufficient documentation

## 2011-06-02 DIAGNOSIS — R109 Unspecified abdominal pain: Secondary | ICD-10-CM | POA: Insufficient documentation

## 2011-06-02 DIAGNOSIS — R197 Diarrhea, unspecified: Secondary | ICD-10-CM | POA: Insufficient documentation

## 2011-06-02 DIAGNOSIS — I10 Essential (primary) hypertension: Secondary | ICD-10-CM | POA: Insufficient documentation

## 2011-06-02 DIAGNOSIS — E079 Disorder of thyroid, unspecified: Secondary | ICD-10-CM | POA: Insufficient documentation

## 2011-06-02 DIAGNOSIS — E119 Type 2 diabetes mellitus without complications: Secondary | ICD-10-CM | POA: Insufficient documentation

## 2011-06-02 DIAGNOSIS — R5381 Other malaise: Secondary | ICD-10-CM | POA: Insufficient documentation

## 2011-06-02 DIAGNOSIS — R112 Nausea with vomiting, unspecified: Secondary | ICD-10-CM

## 2011-06-02 LAB — CBC
HCT: 43.1 % (ref 36.0–46.0)
Hemoglobin: 14.5 g/dL (ref 12.0–15.0)
MCH: 30 pg (ref 26.0–34.0)
MCHC: 33.6 g/dL (ref 30.0–36.0)
MCV: 89.2 fL (ref 78.0–100.0)
Platelets: 215 10*3/uL (ref 150–400)
RBC: 4.83 MIL/uL (ref 3.87–5.11)
RDW: 13.7 % (ref 11.5–15.5)
WBC: 8.1 10*3/uL (ref 4.0–10.5)

## 2011-06-02 LAB — BASIC METABOLIC PANEL
BUN: 8 mg/dL (ref 6–23)
CO2: 23 mEq/L (ref 19–32)
Calcium: 9.1 mg/dL (ref 8.4–10.5)
Chloride: 101 mEq/L (ref 96–112)
Creatinine, Ser: 0.69 mg/dL (ref 0.50–1.10)
GFR calc Af Amer: >60 mL/min (ref >60)
GFR calc non Af Amer: >60 mL/min (ref >60)
Glucose, Bld: 231 mg/dL — ABNORMAL HIGH (ref 70–99)
Potassium: 4.2 mEq/L (ref 3.5–5.1)
Sodium: 136 mEq/L (ref 135–145)

## 2011-06-02 MED ORDER — SODIUM CHLORIDE 0.9 % IV BOLUS (SEPSIS)
1000.0000 mL | Freq: Once | INTRAVENOUS | Status: AC
  Administered 2011-06-02: 1000 mL via INTRAVENOUS

## 2011-06-02 MED ORDER — ONDANSETRON HCL 4 MG/2ML IJ SOLN
4.0000 mg | Freq: Once | INTRAMUSCULAR | Status: AC
  Administered 2011-06-02: 4 mg via INTRAVENOUS
  Filled 2011-06-02: qty 2

## 2011-06-02 MED ORDER — ONDANSETRON HCL 4 MG PO TABS: 8.0000 mg | ORAL_TABLET | Freq: Three times a day (TID) | ORAL | Status: AC | PRN

## 2011-06-02 NOTE — ED Provider Notes (Signed)
History   Written by Enos Fling acting as scribe for Lyanne Co, MD.   Chief Complaint  Patient presents with  . Abdominal Pain  . Emesis  . Diarrhea   HPI Comments: Pt c/o n/v/d with associated abd cramping. Diarrhea started first for a few days, resolved for a few days with taking pepto-bismol, then diarrhea returned again 2 days ago with n/v as well and has continued since then. +black stools and generalized weakness for a few days. No recent abx or travel. No bloody stools. No blood in emesis. No focal weakness, cp, sob, or fever. PCP Dr. Felecia Shelling. Pt has incontinence which is not new.  Patient is a 60 y.o. female presenting with diarrhea. The history is provided by the patient.  Diarrhea The primary symptoms include nausea, vomiting and diarrhea. Primary symptoms do not include fever, abdominal pain, hematemesis, dysuria, myalgias or rash. Primary symptoms comment: black stool The illness began 6 to 7 days ago. The problem has been gradually worsening.  The illness is also significant for chills. The illness does not include anorexia, constipation or back pain. Associated symptoms comments: Abdominal cramping, no abd pain..    Past Medical History  Diagnosis Date  . High blood pressure   . Diabetes mellitus   . Thyroid disease   . Chronic mental illness   . High cholesterol   . Bipolar 1 disorder     Past Surgical History  Procedure Date  . Tubes tied     Family History  Problem Relation Age of Onset  . Fainting Other     female <55  . Diabetes Other     History  Substance Use Topics  . Smoking status: Current Everyday Smoker  . Smokeless tobacco: Not on file  . Alcohol Use: No    OB History    Grav Para Term Preterm Abortions TAB SAB Ect Mult Living                  Review of Systems  Constitutional: Positive for chills. Negative for fever.  Gastrointestinal: Positive for nausea, vomiting and diarrhea. Negative for abdominal pain, constipation, blood  in stool, anorexia and hematemesis.  Genitourinary: Negative for dysuria.  Musculoskeletal: Negative for myalgias and back pain.  Skin: Negative for rash.  Neurological: Positive for weakness.  All other systems reviewed and are negative.    Physical Exam  BP 151/65  Pulse 86  Temp(Src) 98.3 F (36.8 C) (Oral)  Resp 19  Ht 5\' 7"  (1.702 m)  Wt 229 lb (103.874 kg)  BMI 35.87 kg/m2  SpO2 95%  Physical Exam  Nursing note and vitals reviewed. Constitutional: She is oriented to person, place, and time. She appears well-developed and well-nourished. No distress.  HENT:  Head: Normocephalic.  Mouth/Throat: Mucous membranes are normal.  Eyes:       Normal appearance  Neck: Normal range of motion. Neck supple.  Cardiovascular: Normal rate and regular rhythm.  Exam reveals no gallop and no friction rub.   No murmur heard. Pulmonary/Chest: Effort normal and breath sounds normal. She has no wheezes. She has no rhonchi. She has no rales.  Abdominal: Soft. There is no tenderness.  Musculoskeletal: Normal range of motion.       Normal appearance  Neurological: She is alert and oriented to person, place, and time.       Motor intact in all extremities  Skin: Skin is warm and dry. No rash noted.       Color normal  Psychiatric: She has a normal mood and affect.    ED Course  Procedures Results for orders placed during the hospital encounter of 06/02/11  CBC      Component Value Range   WBC 8.1  4.0 - 10.5 (K/uL)   RBC 4.83  3.87 - 5.11 (MIL/uL)   Hemoglobin 14.5  12.0 - 15.0 (g/dL)   HCT 25.9  56.3 - 87.5 (%)   MCV 89.2  78.0 - 100.0 (fL)   MCH 30.0  26.0 - 34.0 (pg)   MCHC 33.6  30.0 - 36.0 (g/dL)   RDW 64.3  32.9 - 51.8 (%)   Platelets 215  150 - 400 (K/uL)  BASIC METABOLIC PANEL      Component Value Range   Sodium 136  135 - 145 (mEq/L)   Potassium 4.2  3.5 - 5.1 (mEq/L)   Chloride 101  96 - 112 (mEq/L)   CO2 23  19 - 32 (mEq/L)   Glucose, Bld 231 (*) 70 - 99 (mg/dL)    BUN 8  6 - 23 (mg/dL)   Creatinine, Ser 8.41  0.50 - 1.10 (mg/dL)   Calcium 9.1  8.4 - 66.0 (mg/dL)   GFR calc non Af Amer >60  >60 (mL/min)   GFR calc Af Amer >60  >60 (mL/min)   No results found.   MDM  I personally reviewed the patient's laboratory data.  At this time the patient appears to have a viral gastroenteritis with associated nausea vomiting diarrhea. WBC count hemoglobin and platelet her electrolytes are normal.  Will treat symptomatically at this time and reevaluate  10:34 AMthe pt feels much better at this time. Home with antiemetics and close pcp followup.  I personally performed the services described in this documentation, which was scribed in my presence. The recorded information has been reviewed and considered. Lyanne Co, MD        Lyanne Co, MD 06/02/11 [phone]

## 2011-06-02 NOTE — ED Notes (Signed)
Pt c/o intermittant all over abd cramping with n/v/d. intermittant headache. No abd pain or headache at this time. Mm slightly dry. Color wnl. nad at this time.

## 2011-06-02 NOTE — ED Notes (Signed)
Hemocult done by medical student-[facility]omo. Negative. EDP aware.

## 2011-06-03 ENCOUNTER — Encounter: Payer: Self-pay | Admitting: Cardiology

## 2011-06-03 ENCOUNTER — Ambulatory Visit: Payer: Self-pay | Admitting: Cardiology

## 2011-06-24 ENCOUNTER — Encounter: Payer: Self-pay | Admitting: Cardiology

## 2011-06-24 ENCOUNTER — Ambulatory Visit (INDEPENDENT_AMBULATORY_CARE_PROVIDER_SITE_OTHER): Payer: Medicaid Other | Admitting: Cardiology

## 2011-06-24 ENCOUNTER — Other Ambulatory Visit: Payer: Self-pay

## 2011-06-24 DIAGNOSIS — Z720 Tobacco use: Secondary | ICD-10-CM | POA: Insufficient documentation

## 2011-06-24 DIAGNOSIS — F17200 Nicotine dependence, unspecified, uncomplicated: Secondary | ICD-10-CM

## 2011-06-24 DIAGNOSIS — Z8679 Personal history of other diseases of the circulatory system: Secondary | ICD-10-CM

## 2011-06-24 DIAGNOSIS — R079 Chest pain, unspecified: Secondary | ICD-10-CM | POA: Insufficient documentation

## 2011-06-24 DIAGNOSIS — E119 Type 2 diabetes mellitus without complications: Secondary | ICD-10-CM | POA: Insufficient documentation

## 2011-06-24 DIAGNOSIS — E7800 Pure hypercholesterolemia, unspecified: Secondary | ICD-10-CM | POA: Insufficient documentation

## 2011-06-24 DIAGNOSIS — I10 Essential (primary) hypertension: Secondary | ICD-10-CM

## 2011-06-24 DIAGNOSIS — E079 Disorder of thyroid, unspecified: Secondary | ICD-10-CM | POA: Insufficient documentation

## 2011-06-24 NOTE — Assessment & Plan Note (Signed)
Mildly elevated on this visit.  Review off office records from Dr. Felecia Shelling documents BP 132/88. No changes on her current medications at this time.
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
## 2011-06-24 NOTE — Progress Notes (Signed)
Patient seen and examined with Ms. Lawrence. She is referred with a history of chest discomfort that is suggestive of potential angina in the setting of cardiac risk factors already noted. ECG is relatively nonspecific. She has not undergone a prior ischemic workup. Today we discussed the critical importance of smoking cessation. Also recommend proceeding with an echocardiogram as well as Myoview for further objective evaluation to better guide further therapy.

## 2011-06-24 NOTE — Assessment & Plan Note (Addendum)
She has multiple CVRF for CAD as stated in HPI. She is symptomatic with exertion and with emotional stress.  She has been seen and examined by myself and by Dr. Diona Browner on this visit. This has been discussed with her and we plan a stress myoview and echocardiogram for diagnostic/prognostic purposes.  This has been explained to her and she is will to proceed. We will see her in the office on follow-up after testing is complete. Will add ECASA 81mg  daily.

## 2011-06-24 NOTE — Patient Instructions (Signed)
Your physician has requested that you have en exercise stress myoview. For further information please visit [email or web address] Please follow instruction sheet, as given.  Your physician has requested that you have an echocardiogram. Echocardiography is a painless test that uses sound waves to create images of your heart. It provides your doctor with information about the size and shape of your heart and how well your heart's chambers and valves are working. This procedure takes approximately one hour. There are no restrictions for this procedure.  Your physician has recommended you make the following change in your medication: start taking Aspirin 81 mg daily  Your physician recommends that you schedule a follow-up appointment in: after tests

## 2011-06-24 NOTE — Assessment & Plan Note (Signed)
I have discussed smoking cessation with her. She states she has tried multiple times to quit using several different methods. She has been counseled again to quit smoking with multiple health issues and risks associated with this. She verbalizes understanding.

## 2011-07-01 ENCOUNTER — Encounter (HOSPITAL_COMMUNITY): Payer: Medicaid Other

## 2011-07-01 ENCOUNTER — Ambulatory Visit (HOSPITAL_COMMUNITY): Payer: Medicaid Other

## 2011-07-01 ENCOUNTER — Encounter: Payer: Medicaid Other | Admitting: *Deleted

## 2011-07-08 ENCOUNTER — Ambulatory Visit: Payer: Medicaid Other | Admitting: Adult Health

## 2011-07-14 ENCOUNTER — Encounter (HOSPITAL_COMMUNITY)
Admission: RE | Admit: 2011-07-14 | Discharge: 2011-07-14 | Disposition: A | Discharge status: Home / Self Care | Payer: Medicaid Other | Source: Ambulatory Visit | Attending: Cardiology | Admitting: Cardiology

## 2011-07-14 ENCOUNTER — Ambulatory Visit (HOSPITAL_COMMUNITY)
Admission: RE | Admit: 2011-07-14 | Discharge: 2011-07-14 | Disposition: A | Discharge status: Home / Self Care | Payer: Medicaid Other | Source: Ambulatory Visit | Attending: Cardiology | Admitting: Cardiology

## 2011-07-14 ENCOUNTER — Encounter (HOSPITAL_COMMUNITY): Payer: Self-pay

## 2011-07-14 ENCOUNTER — Encounter (HOSPITAL_COMMUNITY): Payer: Self-pay | Admitting: Cardiology

## 2011-07-14 ENCOUNTER — Ambulatory Visit (INDEPENDENT_AMBULATORY_CARE_PROVIDER_SITE_OTHER): Payer: Medicaid Other | Admitting: *Deleted

## 2011-07-14 DIAGNOSIS — R079 Chest pain, unspecified: Secondary | ICD-10-CM

## 2011-07-14 DIAGNOSIS — R072 Precordial pain: Secondary | ICD-10-CM

## 2011-07-14 DIAGNOSIS — F17200 Nicotine dependence, unspecified, uncomplicated: Secondary | ICD-10-CM | POA: Insufficient documentation

## 2011-07-14 DIAGNOSIS — I10 Essential (primary) hypertension: Secondary | ICD-10-CM | POA: Insufficient documentation

## 2011-07-14 DIAGNOSIS — I2510 Atherosclerotic heart disease of native coronary artery without angina pectoris: Secondary | ICD-10-CM | POA: Insufficient documentation

## 2011-07-14 DIAGNOSIS — E119 Type 2 diabetes mellitus without complications: Secondary | ICD-10-CM | POA: Insufficient documentation

## 2011-07-14 DIAGNOSIS — R0789 Other chest pain: Secondary | ICD-10-CM | POA: Insufficient documentation

## 2011-07-14 IMAGING — NM NM MYOCAR SINGLE W/SPECT W/WALL MOTION & EF
2 series · 12 of 12 positions shown · non-contrast
Comparison: none

Ordering Physician: SAKINE

SAKINE Physician: [REDACTED]al Data: 59-year-old woman with chest pain.
NUCLEAR MEDICINE STRESS MYOVIEW STUDY WITH SPECT AND LEFT
VENTRICULAR EJECTION FRACTION
Radionuclide Data: One-day rest/stress protocol performed with
[DATE] mCi of [MR] Myoview.
Stress Data: Treadmill exercise performed to a workload of 4.6 mets
and a heart rate of 131, 80% of age predicted maximum.  Exercise
discontinued due to dyspnea and fatigue; no chest discomfort
reported.  Blood pressure increased from a resting value of 130/70
to 140/80 in recovery, probably a normal response, as intervening
measurements were not obtained.  No arrhythmias noted.
EKG: Normal sinus rhythm; nonspecific ST-segment abnormality.
Stress EKG:  Interpretation is somewhat impaired by artifact; no
significant abnormalities identified.
Scintigraphic Data: Acquisition notable for moderate breast
attenuation.  Left ventricular size was normal.  On tomographic
images reconstructed in standard planes, there was a very small
focal septal defect of mild intensity for which no reversibility
was appreciated.  A second very small defect in the mid and distal
inferolateral lateral segment extending apically of borderline
numeric significance with minor reversibility was also noted.  The
gated reconstruction demonstrated normal to hyperdynamic regional
and global LV systolic function as well as normal systolic
accentuation of activity throughout.  Estimated ejection fraction
exceeded 65%.

[Series 1: cs cardiac tc hi dose · 6.41mm/px · 6 of 512 frames shown]
[frame 43/512]
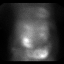
[frame 128/512]
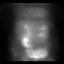
[frame 214/512]
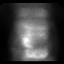
[frame 299/512]
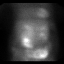
[frame 384/512]
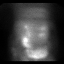
[frame 470/512]
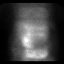

[Series 1: cr cardiac tc low dose · 6.41mm/px · 6 of 64 frames shown]
[frame 6/64]
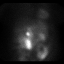
[frame 16/64]
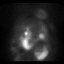
[frame 27/64]
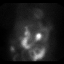
[frame 38/64]
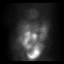
[frame 48/64]
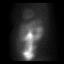
[frame 59/64]
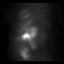

[12 of 12 positions shown; findings below may reference images not displayed]

IMPRESSION: Probably negative stress nuclear myocardial study revealing
impaired exercise capacity, normal left ventricular size, normal
left ventricular systolic function and no significant stress-
induced EKG abnormalities.  By scintigraphic imaging, there were
two areas of abnormal tracer uptake possibly representing breast
attenuation and a right ventricular insertion artifact.  Small
areas of scarring cannot be unequivocally excluded.  No convincing
evidence for significant ischemia.  Other findings as noted.

## 2011-07-14 MED ORDER — TECHNETIUM TC 99M TETROFOSMIN IV KIT
30.0000 | PACK | Freq: Once | INTRAVENOUS | Status: AC | PRN
  Administered 2011-07-14: 28.5 via INTRAVENOUS

## 2011-07-14 MED ORDER — TECHNETIUM TC 99M TETROFOSMIN IV KIT
10.0000 | PACK | Freq: Once | INTRAVENOUS | Status: AC | PRN
  Administered 2011-07-14: 10.2 via INTRAVENOUS

## 2011-07-14 NOTE — Progress Notes (Signed)
Stress Lab Nurses Notes - [facility]  Claudia Keller 07/14/2011  Reason for doing test: Chest Pain  Type of test: Stress Myoview  Nurse performing test: Marlena Clipper, RN  Nuclear Medicine Tech: Lyndel Pleasure  Echo Tech: Not Applicable  MD performing test: R. Rothbart  Family MD: Fanta  Test explained and consent signed: yes  IV started: 22g jelco, Saline lock flushed, No redness or edema and Saline lock started in radiology  Symptoms: Sob and Fatigue in legs  Treatment/Intervention: None  Reason test stopped: fatigue and SOB  After recovery IV was: Discontinued via X-ray tech and No redness or edema  Patient to return to Nuc. Med at :10:50  Patient discharged: Home  Patient's Condition upon discharge was: stable  Comments: Symptoms resolved in Recovery. Resting BP 130/70 &HR78, Peak HR 131 and BP1138/70   Angelica Pou

## 2011-07-14 NOTE — Progress Notes (Signed)
*  PRELIMINARY RESULTS* Echocardiogram 2D Echocardiogram has been performed.  Conrad Worden 07/14/2011, 11:33 AM

## 2011-07-15 ENCOUNTER — Encounter: Payer: Self-pay | Admitting: Adult Health

## 2011-07-18 NOTE — Progress Notes (Signed)
Please see dictated report for Stress Nuclear Study.   

## 2011-07-19 ENCOUNTER — Ambulatory Visit (INDEPENDENT_AMBULATORY_CARE_PROVIDER_SITE_OTHER): Payer: Medicaid Other | Admitting: Adult Health

## 2011-07-19 ENCOUNTER — Encounter: Payer: Self-pay | Admitting: Adult Health

## 2011-07-19 DIAGNOSIS — R079 Chest pain, unspecified: Secondary | ICD-10-CM

## 2011-07-19 NOTE — Assessment & Plan Note (Addendum)
I have reviewed stress test and echo results with Claudia Keller.  They reveal boarderline LVH with normal LV fx.  60-65%.  Mildly increased RV wall thickness with normal cavity size. The stress test was "probably negative revealing impaired exercise capacity, and normal LV size, Normal LV systolic fx an no significant stress induced EKG abnormalities.  There were 2 areas of abnormal tracer uptake possibly representing breast attenuation and RV insertion artifact.  Small areas of scarring cannot be unequivaocally excluded. No convincing evidence for significant ischemia.  Reassurance is given to the patient.  Referral to pulmonologist is recommended. She wishes to continue with the [facility] for this.  She wants to have Dr. Felecia Shelling refer her and plans a follow-up appointment with him.  She will see [place] PRN or if she has significant chest pain, in which case, we will plan cardiac catheterization.

## 2011-07-19 NOTE — Progress Notes (Signed)
HPI:  Claudia Keller is a 60 y/o patient of Dr. Felecia Shelling, looking older than stated age, who comes today on follow up, after being seen initially at the request of Dr. Felecia Shelling, for evaluation of chest pain with multiple CVRF's.  She describes knife-like substernal chest pain radiating to her right arm and around her back, lasting approximately 30 minutes while arguing with her son. She states she feels this when she is under stress and has been with family issues.  She was cleaning out a stove yesterday and felt chest pain described as pressure in the center of her chest. She stopped, took an ASA and the pain resolved in about 10 mins  There was no radiation at that time. She states that she did feel nausea when she had the chest discomfort associated with the argument, but not with cleaning the stove. She has mild DOE, denies diaphoresis, dizziness, or weakness associated with her chest discomfort.  She has multiple CVRF to include FH, Tobacco, Hypercholesterolemia, HTN, Thyroid disease, and DM. On last visit, I planned a stress myoview and an echocardiogram for diagnostic and prognostic purposes. She is   No Known Allergies  Current Outpatient Prescriptions  Medication Sig Dispense Refill  . ALBUTEROL IN Inhale 1 puff into the lungs as needed. For shortness of breath      . aspirin 81 MG EC tablet Take 81 mg by mouth daily.        . Cetirizine HCl 10 MG CAPS Take 10 mg by mouth daily.       Marland Kitchen darifenacin (ENABLEX) 7.5 MG 24 hr tablet Take 7.5 mg by mouth daily.        . furosemide (LASIX) 20 MG tablet Take 20 mg by mouth daily.        Marland Kitchen gabapentin (NEURONTIN) 100 MG capsule Take 100 mg by mouth 3 (three) times daily.        . insulin glargine (LANTUS) 100 UNIT/ML injection Inject 60 Units into the skin at bedtime.        . insulin lispro (HUMALOG) 100 UNIT/ML injection Inject 25 Units into the skin 3 (three) times daily before meals. SS      . levothyroxine (SYNTHROID, LEVOTHROID) 125 MCG tablet Take 125  mcg by mouth daily.       Marland Kitchen lisinopril (PRINIVIL,ZESTRIL) 20 MG tablet Take 20 mg by mouth daily.        Marland Kitchen LORazepam (ATIVAN) 0.5 MG tablet Take 0.5 mg by mouth 2 (two) times daily as needed. For anxiety      . metFORMIN (GLUCOPHAGE) 500 MG tablet Take 1,000 mg by mouth 2 (two) times daily with a meal.        . NON FORMULARY Take 5 mLs by mouth daily as needed. Tussin OTC Cough Syrup: as needed for cough      . PARoxetine (PAXIL) 20 MG tablet Take 20 mg by mouth every morning. 1 tab in morning and 2 tabs at bedtime       . simvastatin (ZOCOR) 40 MG tablet Take 40 mg by mouth daily.        . solifenacin (VESICARE) 5 MG tablet Take 5 mg by mouth daily.       . traZODone (DESYREL) 50 MG tablet Take 50 mg by mouth at bedtime.        . NON FORMULARY Sustenex: one daily         Past Medical History  Diagnosis Date  . Essential hypertension, benign   .  Type 2 diabetes mellitus   . Hypothyroidism   . Mixed hyperlipidemia   . Bipolar disorder   . Hx of colonic polyps   . Stress incontinence, female   . Diabetes mellitus     Past Surgical History  Procedure Date  . Bilateral tubal ligation     ROS: Review of systems complete and found to be negative unless listed above PHYSICAL EXAM BP 124/75  Pulse 110  Resp 18  Ht 5\' 7"  (1.702 m)  Wt 221 lb 6.4 oz (100.426 kg)  BMI 34.68 kg/m2  SpO2 96%  General: Well developed, well nourished, in no acute distress Head: Eyes PERRLA, No xanthomas.   Normal cephalic and atramatic  Lungs: Clear bilaterally to auscultation and percussion. Heart: HRRR S1 S2, occasional irregularity.  Pulses are 2+ & equal.            No carotid bruit. No JVD.  No abdominal bruits. No femoral bruits. Abdomen: Bowel sounds are positive, abdomen soft and non-tender without masses or                  Hernia's noted. Msk:  Back normal, normal gait. Normal strength and tone for age. Extremities: No clubbing, cyanosis or edema.  DP +1 Neuro: Alert and oriented X  3. Psych:  Good affect, responds appropriately    ASSESSMENT AND PLAN

## 2011-09-07 ENCOUNTER — Ambulatory Visit (INDEPENDENT_AMBULATORY_CARE_PROVIDER_SITE_OTHER): Payer: Medicaid Other | Admitting: Gastroenterology

## 2011-09-07 ENCOUNTER — Encounter: Payer: Self-pay | Admitting: Gastroenterology

## 2011-09-07 VITALS — BP 144/85 | HR 73 | Temp 97.4°F | Ht 67.0 in | Wt 211.2 lb

## 2011-09-07 DIAGNOSIS — R197 Diarrhea, unspecified: Secondary | ICD-10-CM

## 2011-09-07 MED ORDER — BENEFIBER PO POWD: ORAL | Status: DC

## 2011-09-07 NOTE — Progress Notes (Signed)
Cc to PCP 

## 2011-09-07 NOTE — Patient Instructions (Signed)
Please have blood work done and complete stool studies. We will call you with those results.  In the meantime, start taking a probiotic daily. We have given you samples to get you started.   Also, take 1-2 teaspoons of benefiber daily.  You may take imodium as needed for diarrhea.   We will see you back in 6 weeks.

## 2011-09-07 NOTE — Progress Notes (Signed)
Referring Provider: Avon Gully, MD Primary Care Physician:  Avon Gully, MD Primary Gastroenterologist: Dr. Jena Gauss   Chief Complaint  Patient presents with  . Irritable Bowel Syndrome  . Diarrhea    HPI:   Claudia Keller is a 60 year old female who presents today in follow-up for chronic diarrhea. She was seen Jan 2011 by Dr. Jena Gauss and subsequently underwent a colonoscopy. Findings outlined in PMH, without microscopic colitis. Stool studies negative. At that she, she reported using Sustenex, which helped to relieve her diarrhea noticeably. She was 283 at that time; she returns today weighing 211. 72 lbs have been lost, unintentionally. She reports diarrhea daily, at least one loose stool. This will alternate with numerous stools, non-bloody. She denies any loss of appetite, fever or chills. She did have an acute illness over the summer with N/V/D that has since resolved. No sick contacts. No recent abx. Drinks bottled water. No abdominal pain.   Says high fiber diet makes worse. Greens make worse. Trying to stay away from greasy foods. Not fond of milk products.   No NSAIDs or aspirin powders.  Past Medical History  Diagnosis Date  . Essential hypertension, benign   . Type 2 diabetes mellitus   . Hypothyroidism   . Mixed hyperlipidemia   . Bipolar disorder   . Hx of colonic polyps   . Stress incontinence, female   . S/P colonoscopy Jan 2011    RMR: pancolonic diverticula, polypoid rectal mucosa with  prominent lymphoid aggregates, repeat in Jan 2016     Past Surgical History  Procedure Date  . Bilateral tubal ligation     Current Outpatient Prescriptions  Medication Sig Dispense Refill  . ALBUTEROL IN Inhale 1 puff into the lungs as needed. For shortness of breath      . aspirin 81 MG EC tablet Take 81 mg by mouth daily.        . Cetirizine HCl 10 MG CAPS Take 10 mg by mouth daily.       . furosemide (LASIX) 20 MG tablet Take 20 mg by mouth daily.        Marland Kitchen gabapentin  (NEURONTIN) 100 MG capsule Take 100 mg by mouth 3 (three) times daily.        . insulin glargine (LANTUS) 100 UNIT/ML injection Inject 60 Units into the skin at bedtime.        . insulin lispro (HUMALOG) 100 UNIT/ML injection Inject 25 Units into the skin 3 (three) times daily before meals. SS      . levothyroxine (SYNTHROID, LEVOTHROID) 125 MCG tablet Take 125 mcg by mouth daily.       Marland Kitchen lisinopril (PRINIVIL,ZESTRIL) 20 MG tablet Take 20 mg by mouth daily.        Marland Kitchen LORazepam (ATIVAN) 0.5 MG tablet Take 0.5 mg by mouth 2 (two) times daily as needed. For anxiety      . metFORMIN (GLUCOPHAGE) 500 MG tablet Take 1,000 mg by mouth 2 (two) times daily with a meal.        . PARoxetine (PAXIL) 20 MG tablet Take 20 mg by mouth every morning. 1 tab in morning and 2 tabs at bedtime       . simvastatin (ZOCOR) 40 MG tablet Take 40 mg by mouth daily.        . solifenacin (VESICARE) 5 MG tablet Take 5 mg by mouth daily.       . traZODone (DESYREL) 50 MG tablet Take 50 mg by mouth at bedtime.        Marland Kitchen  darifenacin (ENABLEX) 7.5 MG 24 hr tablet Take 7.5 mg by mouth daily.        . NON FORMULARY Sustenex: one daily       . NON FORMULARY Take 5 mLs by mouth daily as needed. Tussin OTC Cough Syrup: as needed for cough              Allergies as of 09/07/2011  . (No Known Allergies)    Family History  Problem Relation Age of Onset  . Coronary artery disease    . Diabetes type II    . Schizophrenia Brother   . Stroke Brother   . Cancer Mother     unsure what type  . Heart disease Father   . Heart attack Father     History   Social History  . Marital Status: Legally Separated    Spouse Name: N/A    Number of Children: N/A  . Years of Education: N/A   Social History Main Topics  . Smoking status: Current Everyday Smoker -- 2.0 packs/day    Types: Cigarettes  . Smokeless tobacco: Never Used   Comment: since age 46  . Alcohol Use: No  . Drug Use: No  . Sexually Active: None   Other Topics  Concern  . None   Social History Narrative  . None    Review of Systems: Gen: Denies fever, chills, anorexia. Denies fatigue, weakness, + weight loss.  CV: Denies chest pain, palpitations, syncope, peripheral edema, and claudication. Resp: Denies dyspnea at rest, cough, wheezing, coughing up blood, and pleurisy. GI: Denies vomiting blood, jaundice, and fecal incontinence.   Denies dysphagia or odynophagia. Derm: Denies rash, itching, dry skin Psych: Denies depression, anxiety, memory loss, confusion. No homicidal or suicidal ideation.  Heme: Denies bruising, bleeding, and enlarged lymph nodes.  Physical Exam: BP 144/85  Pulse 73  Temp(Src) 97.4 F (36.3 C) (Temporal)  Ht 5\' 7"  (1.702 m)  Wt 211 lb 3.2 oz (95.8 kg)  BMI 33.08 kg/m2 General:   Alert and oriented. No distress noted. Pleasant and cooperative.  Head:  Normocephalic and atraumatic. Eyes:  Conjuctiva clear without scleral icterus. Mouth:  Oral mucosa pink and moist. No dentition.  Neck:  Supple, without mass or thyromegaly. Heart:  S1, S2 present without murmurs, rubs, or gallops. Regular rate and rhythm. Abdomen:  +BS, soft, obese, non-tender and non-distended. No rebound or guarding. No HSM or masses noted. Msk:  Symmetrical without gross deformities. Normal posture. Extremities:  Without edema. Neurologic:  Alert and  oriented x4;  grossly normal neurologically. Skin:  Intact without significant lesions or rashes. Cervical Nodes:  No significant cervical adenopathy. Psych:  Alert and cooperative. Normal mood and affect.

## 2011-09-07 NOTE — Assessment & Plan Note (Signed)
60 year old female with history of chronic diarrhea, recent colonoscopy as of Jan 2011 on file. As of concern, she has lost 72 lbs since Jan 2011, unintentionally. She remains obese. She does report depression and anxiety, which tend to worsen symptoms. She is on no real bowel regimen or therapy at this time. To ensure completeness, I'd like to get an updated set of stool studies as well as check a TSH and celiac panel. We will institute supplemental fiber in the way of Benefiber daily, as well as a Probiotic. She may take imodium prn.  We will see her back in 6 weeks and contact her with stool results when available.

## 2011-09-23 ENCOUNTER — Other Ambulatory Visit: Payer: Self-pay | Admitting: Gastroenterology

## 2011-09-25 LAB — FECAL LACTOFERRIN, QUANT: Lactoferrin: NEGATIVE

## 2011-09-27 LAB — GIARDIA ANTIGEN: Giardia Screen (EIA): NEGATIVE

## 2011-09-28 LAB — STOOL CULTURE

## 2011-10-04 NOTE — Progress Notes (Signed)
Quick Note:  Has pt had blood work done? (TSH, celiac panel). Awaiting Cdiff PCR as well? ______

## 2011-10-18 ENCOUNTER — Encounter: Payer: Self-pay | Admitting: Internal Medicine

## 2011-10-19 ENCOUNTER — Ambulatory Visit: Payer: Medicaid Other | Admitting: Gastroenterology

## 2011-10-26 ENCOUNTER — Encounter: Payer: Self-pay | Admitting: Gastroenterology

## 2011-10-26 ENCOUNTER — Ambulatory Visit (INDEPENDENT_AMBULATORY_CARE_PROVIDER_SITE_OTHER): Payer: Medicaid Other | Admitting: Gastroenterology

## 2011-10-26 ENCOUNTER — Telehealth: Payer: Self-pay

## 2011-10-26 VITALS — BP 120/79 | HR 116 | Temp 97.6°F | Ht 66.0 in | Wt 212.6 lb

## 2011-10-26 DIAGNOSIS — R197 Diarrhea, unspecified: Secondary | ICD-10-CM

## 2011-10-26 DIAGNOSIS — R112 Nausea with vomiting, unspecified: Secondary | ICD-10-CM

## 2011-10-26 LAB — CBC WITH DIFFERENTIAL/PLATELET
Basophils Absolute: 0 10*3/uL (ref 0.0–0.1)
Basophils Relative: 0 % (ref 0–1)
Eosinophils Absolute: 0.1 10*3/uL (ref 0.0–0.7)
Eosinophils Relative: 1 % (ref 0–5)
HCT: 43.4 % (ref 36.0–46.0)
Hemoglobin: 14.9 g/dL (ref 12.0–15.0)
Lymphocytes Relative: 22 % (ref 12–46)
Lymphs Abs: 2.9 10*3/uL (ref 0.7–4.0)
MCH: 30.7 pg (ref 26.0–34.0)
MCHC: 34.3 g/dL (ref 30.0–36.0)
MCV: 89.3 fL (ref 78.0–100.0)
Monocytes Absolute: 1.1 10*3/uL — ABNORMAL HIGH (ref 0.1–1.0)
Monocytes Relative: 8 % (ref 3–12)
Neutro Abs: 8.8 10*3/uL — ABNORMAL HIGH (ref 1.7–7.7)
Neutrophils Relative %: 69 % (ref 43–77)
Platelets: 275 10*3/uL (ref 150–400)
RBC: 4.86 MIL/uL (ref 3.87–5.11)
RDW: 13.7 % (ref 11.5–15.5)
WBC: 12.9 10*3/uL — ABNORMAL HIGH (ref 4.0–10.5)

## 2011-10-26 NOTE — Telephone Encounter (Signed)
Called, many rings and no answer. Pt needs to repeat C-Diff (not enough specimen). Order and container at front.

## 2011-10-26 NOTE — Patient Instructions (Addendum)
We are calling the lab to see if they have the final results of the stool sample we are missing.  In the meantime, please have labs drawn today if possible. We will call you with results.   Start taking metamucil daily. We have provided samples. If you need more, please let us know.  Also, take imodium prn diarrhea. This seems to have helped in the past.  Please obtain the stool sample and return to our office. This is checking for blood in your stool.  Finally, we are proceeding with an upper endoscopy in the near future with Dr. Jena Gauss due to weight loss and vomiting.   Have a [place] Christmas!

## 2011-10-26 NOTE — Progress Notes (Signed)
Referring Provider: Avon Gully, MD Primary Care Physician:  Avon Gully, MD  Chief Complaint  Patient presents with  . Follow-up  . Diarrhea    HPI:   Claudia Keller is a 60 year old female who presents today in follow-up for diarrhea. She was seen in October with reports of loose stools. She has a chronic history of this, with last colonoscopy in Jan 2011 as outlined below, negative for microscopic colitis. She was unable to complete the labs that I had ordered to include TSH, celiac panel. We had incomplete stool studies as well and still needed Cdiff. Unfortunately, she has had problems with transportation. She continues to report postprandial loose stools. However, she reports the most is twice per day. She does not eat dedicated meals. She states she is now having issues with nausea and vomiting. Denies any epigastric pain. Denies early satiety or heartburn. She is concerned that she may have cancer. She will eat a meal and vomit shortly thereafter; again, she denies any pain, just severe nausea.  Concerning is wt loss from 283 in Jan 2011 now to 212. She states this is unintentional. However, she is up 1 lb from her visit in October, so it has remained stable for a few months. She is quite concerned that she may have cancer, and she became tearful.    Interestingly, she states imodium helps tremendously with diarrhea, but she has not been taking this. Metamucil also helped, but she has been unable to purchase this. There appears to be issues with compliance secondary to financial and transportation issues.     Past Medical History  Diagnosis Date  . Essential hypertension, benign   . Type 2 diabetes mellitus   . Hypothyroidism   . Mixed hyperlipidemia   . Bipolar disorder   . Hx of colonic polyps   . Stress incontinence, female   . S/P colonoscopy Jan 2011    RMR: pancolonic diverticula, polypoid rectal mucosa with  prominent lymphoid aggregates, repeat in Jan 2016     Past  Surgical History  Procedure Date  . Bilateral tubal ligation   . Colonoscopy 11/17/2009    normal rectum/    Current Outpatient Prescriptions  Medication Sig Dispense Refill  . ALBUTEROL IN Inhale 1 puff into the lungs as needed. For shortness of breath      . aspirin 81 MG EC tablet Take 81 mg by mouth daily.        . Cetirizine HCl 10 MG CAPS Take 10 mg by mouth daily.       Marland Kitchen darifenacin (ENABLEX) 7.5 MG 24 hr tablet Take 7.5 mg by mouth daily.        . furosemide (LASIX) 20 MG tablet Take 20 mg by mouth daily.        Marland Kitchen gabapentin (NEURONTIN) 100 MG capsule Take 100 mg by mouth 3 (three) times daily.        . insulin glargine (LANTUS) 100 UNIT/ML injection Inject 60 Units into the skin at bedtime.        . insulin lispro (HUMALOG) 100 UNIT/ML injection Inject 25 Units into the skin 3 (three) times daily before meals. SS      . levothyroxine (SYNTHROID, LEVOTHROID) 125 MCG tablet Take 125 mcg by mouth daily.       Marland Kitchen lisinopril (PRINIVIL,ZESTRIL) 20 MG tablet Take 20 mg by mouth daily.        Marland Kitchen LORazepam (ATIVAN) 0.5 MG tablet Take 0.5 mg by mouth 2 (two) times daily as needed.  For anxiety      . metFORMIN (GLUCOPHAGE) 500 MG tablet Take 1,000 mg by mouth 2 (two) times daily with a meal.        . NON FORMULARY Sustenex: one daily       . NON FORMULARY Take 5 mLs by mouth daily as needed. Tussin OTC Cough Syrup: as needed for cough      . PARoxetine (PAXIL) 20 MG tablet Take 20 mg by mouth every morning. 1 tab in morning and 2 tabs at bedtime       . simvastatin (ZOCOR) 40 MG tablet Take 40 mg by mouth daily.        . solifenacin (VESICARE) 5 MG tablet Take 5 mg by mouth daily.       . traZODone (DESYREL) 50 MG tablet Take 50 mg by mouth at bedtime.        . Wheat Dextrin (BENEFIBER) POWD Take 1 tbsp daily.  1 Can  3    Allergies as of 10/26/2011  . (No Known Allergies)    Family History  Problem Relation Age of Onset  . Coronary artery disease    . Diabetes type II    .  Schizophrenia Brother   . Stroke Brother   . Cancer Mother     unsure what type  . Heart disease Father   . Heart attack Father     History   Social History  . Marital Status: Legally Separated    Spouse Name: N/A    Number of Children: N/A  . Years of Education: N/A   Social History Main Topics  . Smoking status: Current Everyday Smoker -- 2.0 packs/day    Types: Cigarettes  . Smokeless tobacco: Never Used   Comment: since age 20  . Alcohol Use: No  . Drug Use: No  . Sexually Active: None   Other Topics Concern  . None   Social History Narrative  . None    Review of Systems: Gen: SEE HPI CV: Denies chest pain, palpitations, syncope, peripheral edema, and claudication. Resp: Denies dyspnea at rest, cough, wheezing, coughing up blood, and pleurisy. GI: SEE HPI Derm: Denies rash, itching, dry skin Psych: Denies depression, anxiety, memory loss, confusion. No homicidal or suicidal ideation.  Heme: Denies bruising, bleeding, and enlarged lymph nodes.  Physical Exam: BP 120/79  Pulse 116  Temp(Src) 97.6 F (36.4 C) (Temporal)  Ht 5\' 6"  (1.676 m)  Wt 212 lb 9.6 oz (96.435 kg)  BMI 34.31 kg/m2 General:   Alert and oriented. No distress noted. Pleasant and cooperative.  Head:  Normocephalic and atraumatic. Eyes:  Conjuctiva clear without scleral icterus. Mouth:  Oral mucosa pink and moist.  Neck:  Supple, without mass or thyromegaly. Heart:  S1, S2 present without murmurs, rubs, or gallops. Regular rate and rhythm. Abdomen:  +BS, soft, non-tender and non-distended. No rebound or guarding. No HSM or masses noted. Msk:  Symmetrical without gross deformities. Normal posture. Extremities:  Without edema. Neurologic:  Alert and  oriented x4;  grossly normal neurologically. Skin:  Intact without significant lesions or rashes. Cervical Nodes:  No significant cervical adenopathy. Psych:  Alert and cooperative. Normal mood and affect.

## 2011-10-27 DIAGNOSIS — R112 Nausea with vomiting, unspecified: Secondary | ICD-10-CM | POA: Insufficient documentation

## 2011-10-27 LAB — IGA: IgA: 114 mg/dL (ref 69–380)

## 2011-10-27 LAB — TSH: TSH: 0.066 u[IU]/mL — ABNORMAL LOW (ref 0.350–4.500)

## 2011-10-27 LAB — TISSUE TRANSGLUTAMINASE, IGA: Tissue Transglutaminase Ab, IgA: 3.5 U/mL (ref <20)

## 2011-10-27 NOTE — Progress Notes (Signed)
Cc to PCP 

## 2011-10-27 NOTE — Assessment & Plan Note (Addendum)
Nausea and vomiting intermittently with decreased po intake and wt loss as evidenced by wt of 283 in Jan 2011 down to 212. She is diabetic, so gastroparesis remains in the differential. Although she denies reflux, epigastric pain, dysphagia, we will proceed with an EGD for initial assessment and consider GES if findings inconclusive. We are also obtaining labs as mentioned to include TSH, CBC, celiac panel, ifobt. If wt loss continues, consider CT scan.   Proceed with upper endoscopy in the near future with Dr. Jena Gauss. The risks, benefits, and alternatives have been discussed in detail with patient. They have stated understanding and desire to proceed.  1/2 dose of Lantus evening prior No DM medications day of

## 2011-10-27 NOTE — Assessment & Plan Note (Signed)
60 year old female with history of chronic loose stools, last colonoscopy in Jan 2011 negative for microscopic colitis. We have an incomplete set of stool studies on file, as Cdiff was never performed for an unknown reason. We are also missing labs that were ordered in October, which she has been unable to complete due to transportation. No melena or hematochezia noted. She has a postprandial component, but she reports only having a max of 2 loose stools per day. However, she does have decreased po intake due to increased nausea. She has lost from 283 in Jan 2011 to 212 now. This is unintentional. Question if this is related simply to decreased po intake; however, we will be assessing with labs as well as EGD as outlined under N/V. Imodium and Metamucil both have helped significantly with diarrhea in past; however, she has not taken this due to financial issues.   Obtain Cdiff PCR for completeness Check TSH, CBC, celiac panel Obtain ifobt for files #20 metamucil samples provided. Continue daily Imodium prn Further recommendations after labs completed.

## 2011-10-28 ENCOUNTER — Ambulatory Visit: Payer: Medicaid Other | Admitting: Gastroenterology

## 2011-10-28 DIAGNOSIS — R197 Diarrhea, unspecified: Secondary | ICD-10-CM

## 2011-10-28 LAB — IFOBT (OCCULT BLOOD): IFOBT: NEGATIVE

## 2011-10-28 NOTE — Progress Notes (Signed)
Quick Note:    Noted    ______

## 2011-10-28 NOTE — Progress Notes (Signed)
Quick Note:  Negative celiac.  TSH low, hx of hypothyroidism, needs Synthroid adjusted. Question if this imbalance is causing worsening of diarrhea. Could help explain, as thus far stool studies negative.   Please fax/cc labs to PCP. Inform pt she needs to make an appt soon with Dr. Felecia Shelling for management of Synthroid. ______

## 2011-10-28 NOTE — Progress Notes (Signed)
Quick Note:  Mild leukocytosis, otherwise no anemia. Proceed with EGD as planned. Need ifobt. ______

## 2011-10-29 ENCOUNTER — Encounter (HOSPITAL_COMMUNITY): Payer: Self-pay

## 2011-11-03 ENCOUNTER — Telehealth: Payer: Self-pay

## 2011-11-03 NOTE — Telephone Encounter (Signed)
Pt informed she needs to repeat C-Diff. She will come by tomorrow to pick up order and container.

## 2011-11-03 NOTE — Telephone Encounter (Signed)
Pt called and left voicemail- she was cancelling her appt for her egd. She stated her aide was not working today and she didn't have a ride to the hospital. I called Florian Buff and left message informing her that pt cancelled her appt. Pt wants to reschedule.

## 2011-11-03 NOTE — Telephone Encounter (Signed)
Let's reschedule

## 2011-11-04 ENCOUNTER — Encounter: Payer: Self-pay | Admitting: Gastroenterology

## 2011-11-04 NOTE — Telephone Encounter (Signed)
Pt rescheduled to 11/23/11- new instructions mailed

## 2011-11-10 NOTE — Progress Notes (Signed)
Results Cc to PCP  

## 2011-11-16 NOTE — Progress Notes (Signed)
Quick Note:  Appears pt scheduled for 1/15. Can we ask her if she has submitted Cdiff. ______

## 2011-11-17 NOTE — Progress Notes (Signed)
She did do one and it was turned in on Dec. 20 th and it was negative.

## 2011-11-22 MED ORDER — SODIUM CHLORIDE 0.45 % IV SOLN
Freq: Once | INTRAVENOUS | Status: AC
  Administered 2011-11-23: 10:00:00 via INTRAVENOUS

## 2011-11-23 ENCOUNTER — Encounter (HOSPITAL_COMMUNITY)
Admission: RE | Disposition: A | Discharge status: Home / Self Care | Payer: Self-pay | Source: Ambulatory Visit | Attending: Internal Medicine

## 2011-11-23 ENCOUNTER — Ambulatory Visit (HOSPITAL_COMMUNITY)
Admission: RE | Admit: 2011-11-23 | Discharge: 2011-11-23 | Disposition: A | Discharge status: Home / Self Care | Payer: Medicaid Other | Source: Ambulatory Visit | Attending: Internal Medicine | Admitting: Internal Medicine

## 2011-11-23 ENCOUNTER — Encounter (HOSPITAL_COMMUNITY): Payer: Self-pay | Admitting: *Deleted

## 2011-11-23 DIAGNOSIS — I10 Essential (primary) hypertension: Secondary | ICD-10-CM | POA: Insufficient documentation

## 2011-11-23 DIAGNOSIS — K2100 Gastro-esophageal reflux disease with esophagitis, without bleeding: Secondary | ICD-10-CM | POA: Insufficient documentation

## 2011-11-23 DIAGNOSIS — Z7982 Long term (current) use of aspirin: Secondary | ICD-10-CM | POA: Insufficient documentation

## 2011-11-23 DIAGNOSIS — R1310 Dysphagia, unspecified: Secondary | ICD-10-CM | POA: Insufficient documentation

## 2011-11-23 DIAGNOSIS — R112 Nausea with vomiting, unspecified: Secondary | ICD-10-CM | POA: Insufficient documentation

## 2011-11-23 DIAGNOSIS — R197 Diarrhea, unspecified: Secondary | ICD-10-CM

## 2011-11-23 DIAGNOSIS — E119 Type 2 diabetes mellitus without complications: Secondary | ICD-10-CM | POA: Insufficient documentation

## 2011-11-23 DIAGNOSIS — Z79899 Other long term (current) drug therapy: Secondary | ICD-10-CM | POA: Insufficient documentation

## 2011-11-23 HISTORY — PX: ESOPHAGOGASTRODUODENOSCOPY: SHX5428

## 2011-11-23 HISTORY — PX: MALONEY DILATION: SHX5535

## 2011-11-23 LAB — GLUCOSE, CAPILLARY: Glucose-Capillary: 157 mg/dL — ABNORMAL HIGH (ref 70–99)

## 2011-11-23 SURGERY — EGD (ESOPHAGOGASTRODUODENOSCOPY)
Anesthesia: Moderate Sedation | Site: Esophagus

## 2011-11-23 MED ORDER — MEPERIDINE HCL 100 MG/ML IJ SOLN
INTRAMUSCULAR | Status: DC | PRN
  Administered 2011-11-23: 25 mg via INTRAVENOUS
  Administered 2011-11-23: 50 mg via INTRAVENOUS

## 2011-11-23 MED ORDER — MIDAZOLAM HCL 5 MG/5ML IJ SOLN
INTRAMUSCULAR | Status: AC
  Filled 2011-11-23: qty 10

## 2011-11-23 MED ORDER — MEPERIDINE HCL 100 MG/ML IJ SOLN
INTRAMUSCULAR | Status: AC
  Filled 2011-11-23: qty 2

## 2011-11-23 MED ORDER — MIDAZOLAM HCL 5 MG/5ML IJ SOLN
INTRAMUSCULAR | Status: DC | PRN
  Administered 2011-11-23: 2 mg via INTRAVENOUS
  Administered 2011-11-23: 1 mg via INTRAVENOUS

## 2011-11-23 MED ORDER — BUTAMBEN-TETRACAINE-BENZOCAINE 2-2-14 % EX AERO
INHALATION_SPRAY | CUTANEOUS | Status: DC | PRN
  Administered 2011-11-23: 2 via TOPICAL

## 2011-11-23 NOTE — Discharge Instructions (Signed)
EGD Discharge instructions Please read the instructions outlined below and refer to this sheet in the next few weeks. These discharge instructions provide you with general information on caring for yourself after you leave the hospital. Your doctor may also give you specific instructions. While your treatment has been planned according to the most current medical practices available, unavoidable complications occasionally occur. If you have any problems or questions after discharge, please call your doctor. ACTIVITY  You may resume your regular activity but move at a slower pace for the next 24 hours.   Take frequent rest periods for the next 24 hours.   Walking will help expel (get rid of) the air and reduce the bloated feeling in your abdomen.   No driving for 24 hours (because of the anesthesia (medicine) used during the test).   You may shower.   Do not sign any important legal documents or operate any machinery for 24 hours (because of the anesthesia used during the test).  NUTRITION  Drink plenty of fluids.   You may resume your normal diet.   Begin with a light meal and progress to your normal diet.   Avoid alcoholic beverages for 24 hours or as instructed by your caregiver.  MEDICATIONS  You may resume your normal medications unless your caregiver tells you otherwise.  WHAT YOU CAN EXPECT TODAY  You may experience abdominal discomfort such as a feeling of fullness or gas pains.  FOLLOW-UP  Your doctor will discuss the results of your test with you.  SEEK IMMEDIATE MEDICAL ATTENTION IF ANY OF THE FOLLOWING OCCUR:  Excessive nausea (feeling sick to your stomach) and/or vomiting.   Severe abdominal pain and distention (swelling).   Trouble swallowing.   Temperature over 101 F (37.8 C).   Rectal bleeding or vomiting of blood.     GERD information provided. Gastroesophageal Reflux Disease, Adult Gastroesophageal reflux disease (GERD) happens when acid from  your stomach flows up into the esophagus. When acid comes in contact with the esophagus, the acid causes soreness (inflammation) in the esophagus. Over time, GERD may create small holes (ulcers) in the lining of the esophagus. CAUSES   Increased body weight. This puts pressure on the stomach, making acid rise from the stomach into the esophagus.   Smoking. This increases acid production in the stomach.   Drinking alcohol. This causes decreased pressure in the lower esophageal sphincter (valve or ring of muscle between the esophagus and stomach), allowing acid from the stomach into the esophagus.   Late evening meals and a full stomach. This increases pressure and acid production in the stomach.   A malformed lower esophageal sphincter.  Sometimes, no cause is found. SYMPTOMS   Burning pain in the lower part of the mid-chest behind the breastbone and in the mid-stomach area. This may occur twice a week or more often.   Trouble swallowing.   Sore throat.   Dry cough.   Asthma-like symptoms including chest tightness, shortness of breath, or wheezing.  DIAGNOSIS  Your caregiver may be able to diagnose GERD based on your symptoms. In some cases, X-rays and other tests may be done to check for complications or to check the condition of your stomach and esophagus. TREATMENT  Your caregiver may recommend over-the-counter or prescription medicines to help decrease acid production. Ask your caregiver before starting or adding any new medicines.  HOME CARE INSTRUCTIONS   Change the factors that you can control. Ask your caregiver for guidance concerning weight loss, quitting smoking,  and alcohol consumption.   Avoid foods and drinks that make your symptoms worse, such as:   Caffeine or alcoholic drinks.   Chocolate.   Peppermint or mint flavorings.   Garlic and onions.   Spicy foods.   Citrus fruits, such as oranges, lemons, or limes.   Tomato-based foods such as sauce, chili,  salsa, and pizza.   Fried and fatty foods.   Avoid lying down for the 3 hours prior to your bedtime or prior to taking a nap.   Eat small, frequent meals instead of large meals.   Wear loose-fitting clothing. Do not wear anything tight around your waist that causes pressure on your stomach.   Raise the head of your bed 6 to 8 inches with wood blocks to help you sleep. Extra pillows will not help.   Only take over-the-counter or prescription medicines for pain, discomfort, or fever as directed by your caregiver.   Do not take aspirin, ibuprofen, or other nonsteroidal anti-inflammatory drugs (NSAIDs).  SEEK IMMEDIATE MEDICAL CARE IF:   You have pain in your arms, neck, jaw, teeth, or back.   Your pain increases or changes in intensity or duration.   You develop nausea, vomiting, or sweating (diaphoresis).   You develop shortness of breath, or you faint.   Your vomit is green, yellow, black, or looks like coffee grounds or blood.   Your stool is red, bloody, or black.  These symptoms could be signs of other problems, such as heart disease, gastric bleeding, or esophageal bleeding. MAKE SURE YOU:   Understand these instructions.   Will watch your condition.   Will get help right away if you are not doing well or get worse.  Document Released: 08/04/2005 Document Revised: 07/07/2011 Document Reviewed: 05/14/2011 [facility] Patient Information 2012 [place], [place].Diet for GERD or PUD Nutrition therapy can help ease the discomfort of gastroesophageal reflux disease (GERD) and peptic ulcer disease (PUD).  HOME CARE INSTRUCTIONS   Eat your meals slowly, in a relaxed setting.   Eat 5 to 6 small meals per day.   If a food causes distress, stop eating it for a period of time.  FOODS TO AVOID  Coffee, regular or decaffeinated.   Cola beverages, regular or low calorie.   Tea, regular or decaffeinated.   Pepper.   Cocoa.   High fat foods, including meats.   Butter,  margarine, hydrogenated oil (trans fats).   Peppermint or spearmint (if you have GERD).   Fruits and vegetables if not tolerated.   Alcohol.   Nicotine (smoking or chewing). This is one of the most potent stimulants to acid production in the gastrointestinal tract.   Any food that seems to aggravate your condition.  If you have questions regarding your diet, ask your caregiver or a registered dietitian. TIPS  Lying flat may make symptoms worse. Keep the head of your bed raised 6 to 9 inches (15 to 23 cm) by using a foam wedge or blocks under the legs of the bed.   Do not lay down until 3 hours after eating a meal.   Daily physical activity may help reduce symptoms.  MAKE SURE YOU:   Understand these instructions.   Will watch your condition.   Will get help right away if you are not doing well or get worse.  Document Released: 10/25/2005 Document Revised: 07/07/2011 Document Reviewed: 03/10/2009 [facility] Patient Information 2012 [place], [place].  Dexilant 60 mg daily; GERD information; OV with [place] in 3 mos

## 2011-11-23 NOTE — H&P (Addendum)
  I have seen & examined the patient prior to the procedure(s) today and reviewed the history and physical/consultation. TSH low; she went to PCP and had thyroid supplement adjusted earlier this week. Did not submit stool for C. difficile. Diarrhea somewhat improved. Takes Imodium and Metamucil for diarrhea. TTG within normal limits. Serum IgA level within normal limits. Otherwise, there have been no changes.  After consideration of the risks, benefits, alternatives and imponderables, the patient has consented to the procedure(s).

## 2011-11-23 NOTE — Op Note (Signed)
[facility] [address] [place], [place]  [address]  ENDOSCOPY PROCEDURE REPORT  PATIENT:  Claudia, Keller  MR#:  [id number] BIRTHDATE:  02-14-1951, 60 yrs. old  GENDER:  female  ENDOSCOPIST:  R. Roetta Sessions, MD FACP [place] Referred by:          Dr. Felecia Shelling  PROCEDURE DATE:  11/23/2011 PROCEDURE:  EGD with Elease Hashimoto dilation  INDICATIONS:   nausea vomiting/esophageal dysphagia  INFORMED CONSENT:   The risks, benefits, limitations, alternatives and imponderables have been discussed.  The potential for biopsy, esophogeal dilation, etc. have also been reviewed.  Questions have been answered.  All parties agreeable.  Please see the history and physical in the medical record for more information.  MEDICATIONS:  Versed 3 mg IV and Demerol 75 mg IV in divided doses. Cetacaine spray  DESCRIPTION OF PROCEDURE:   The [id number] ([id number]) endoscope was introduced through the mouth and advanced to the second portion of the duodenum without difficulty or limitations.  The mucosal surfaces were surveyed very carefully during advancement of the scope and upon withdrawal.  Retroflexion view of the proximal stomach and esophagogastric junction was performed.  <<PROCEDUREIMAGES>>  FINDINGS:  Four-quadrant distal esophageal erosions. The longest mucosal break 1 cm;   patent tubular esophagus.  NO Barrett's esophagus. Stomach empty. Small hiatal hernia; otherwise gastric mucosa appeared normal. Patent pylorus. Normal first     and second portion of the duodenum  THERAPEUTIC / DIAGNOSTIC MANEUVERS PERFORMED:  58 French Maloney dilator passed easily. A lid back revealed no complications related to this maneuver.  COMPLICATIONS:   None  IMPRESSION:               Erosive reflux esophagitis; small hiatal hernia; esophagus dilated empirically.  RECOMMENDATIONS: Begin acid suppression therapy. Dexilant 60 mg daily; stop Metamucil for diarrhea. Continue Imodium as needed for diarrhea.  Bowel dysfunction may, in part, be related to iatrogenic hyperthyroidism. Office visit with [place] in 6 weeks.  ______________________________ R. Roetta Sessions, MD Caleen Essex  CC:  n. eSIGNED:   R. Roetta Sessions at 11/23/2011 11:55 AM  Michael Boston, [phone]

## 2011-11-24 ENCOUNTER — Encounter (HOSPITAL_COMMUNITY): Payer: Self-pay | Admitting: Internal Medicine

## 2011-11-30 ENCOUNTER — Telehealth: Payer: Self-pay

## 2011-11-30 NOTE — Telephone Encounter (Signed)
Received request from pharmacy that pt needs a PA for Dexilant that RMR started her on. Couldn't find any documentation that pt has ever tried any other ppi. Called pt- she has never tried prilosec or protonix. Informed pt that she would have to try both of these meds and if they didn't work she would need to call and let us know. Pt is agreeable to try other meds first.   She will need new rx sent to [facility] please.

## 2011-12-01 NOTE — Telephone Encounter (Signed)
Pt aware, samples at front desk, she is going to send someone over to pick them up.

## 2011-12-01 NOTE — Telephone Encounter (Signed)
If we have samples of Prilosec, let's give her 14 days supply. See how she does with this and have her call us back. If this does not help, we can try Protonix rx.

## 2012-01-11 ENCOUNTER — Telehealth: Payer: Self-pay | Admitting: Gastroenterology

## 2012-01-11 ENCOUNTER — Ambulatory Visit: Payer: Medicaid Other | Admitting: Gastroenterology

## 2012-01-11 NOTE — Telephone Encounter (Signed)
Patient was a no-show 

## 2012-03-09 NOTE — Telephone Encounter (Signed)
1st no-show Needs letter for f/u.

## 2012-03-15 ENCOUNTER — Encounter: Payer: Self-pay | Admitting: Internal Medicine

## 2012-03-15 NOTE — Telephone Encounter (Signed)
Mailed letter to patient to call office to set up OV °

## 2012-04-21 ENCOUNTER — Emergency Department (HOSPITAL_COMMUNITY)
Admission: EM | Admit: 2012-04-21 | Discharge: 2012-04-21 | Disposition: A | Discharge status: Home / Self Care | Payer: Medicaid Other | Attending: Emergency Medicine | Admitting: Emergency Medicine

## 2012-04-21 ENCOUNTER — Encounter (HOSPITAL_COMMUNITY): Payer: Self-pay | Admitting: *Deleted

## 2012-04-21 DIAGNOSIS — Z794 Long term (current) use of insulin: Secondary | ICD-10-CM | POA: Insufficient documentation

## 2012-04-21 DIAGNOSIS — Z7982 Long term (current) use of aspirin: Secondary | ICD-10-CM | POA: Insufficient documentation

## 2012-04-21 DIAGNOSIS — Z79899 Other long term (current) drug therapy: Secondary | ICD-10-CM | POA: Insufficient documentation

## 2012-04-21 DIAGNOSIS — E119 Type 2 diabetes mellitus without complications: Secondary | ICD-10-CM | POA: Insufficient documentation

## 2012-04-21 DIAGNOSIS — E785 Hyperlipidemia, unspecified: Secondary | ICD-10-CM | POA: Insufficient documentation

## 2012-04-21 DIAGNOSIS — R110 Nausea: Secondary | ICD-10-CM

## 2012-04-21 DIAGNOSIS — E039 Hypothyroidism, unspecified: Secondary | ICD-10-CM | POA: Insufficient documentation

## 2012-04-21 DIAGNOSIS — F319 Bipolar disorder, unspecified: Secondary | ICD-10-CM | POA: Insufficient documentation

## 2012-04-21 DIAGNOSIS — F17200 Nicotine dependence, unspecified, uncomplicated: Secondary | ICD-10-CM | POA: Insufficient documentation

## 2012-04-21 DIAGNOSIS — I10 Essential (primary) hypertension: Secondary | ICD-10-CM | POA: Insufficient documentation

## 2012-04-21 HISTORY — DX: Irritable bowel syndrome without diarrhea: K58.9

## 2012-04-21 LAB — URINALYSIS, ROUTINE W REFLEX MICROSCOPIC
Bilirubin Urine: NEGATIVE
Glucose, UA: NEGATIVE mg/dL
Hgb urine dipstick: NEGATIVE
Ketones, ur: NEGATIVE mg/dL
Leukocytes, UA: NEGATIVE
Nitrite: NEGATIVE
Protein, ur: NEGATIVE mg/dL
Specific Gravity, Urine: <1.005 — ABNORMAL LOW (ref 1.005–1.030)
Urobilinogen, UA: 0.2 mg/dL (ref 0.0–1.0)
pH: 6 (ref 5.0–8.0)

## 2012-04-21 LAB — CBC
HCT: 43.4 % (ref 36.0–46.0)
Hemoglobin: 15.1 g/dL — ABNORMAL HIGH (ref 12.0–15.0)
MCH: 30.3 pg (ref 26.0–34.0)
MCHC: 34.8 g/dL (ref 30.0–36.0)
MCV: 87 fL (ref 78.0–100.0)
Platelets: 259 10*3/uL (ref 150–400)
RBC: 4.99 MIL/uL (ref 3.87–5.11)
RDW: 14 % (ref 11.5–15.5)
WBC: 9.7 10*3/uL (ref 4.0–10.5)

## 2012-04-21 LAB — BASIC METABOLIC PANEL
BUN: 16 mg/dL (ref 6–23)
CO2: 23 mEq/L (ref 19–32)
Calcium: 10.1 mg/dL (ref 8.4–10.5)
Chloride: 100 mEq/L (ref 96–112)
Creatinine, Ser: 0.9 mg/dL (ref 0.50–1.10)
GFR calc Af Amer: 80 mL/min — ABNORMAL LOW (ref >90)
GFR calc non Af Amer: 69 mL/min — ABNORMAL LOW (ref >90)
Glucose, Bld: 130 mg/dL — ABNORMAL HIGH (ref 70–99)
Potassium: 3.8 mEq/L (ref 3.5–5.1)
Sodium: 137 mEq/L (ref 135–145)

## 2012-04-21 LAB — T4, FREE: Free T4: 1.35 ng/dL (ref 0.80–1.80)

## 2012-04-21 LAB — TSH: TSH: 7.469 u[IU]/mL — ABNORMAL HIGH (ref 0.350–4.500)

## 2012-04-21 MED ORDER — ONDANSETRON HCL 4 MG PO TABS: 4.0000 mg | ORAL_TABLET | Freq: Four times a day (QID) | ORAL | Status: AC

## 2012-04-21 MED ORDER — SODIUM CHLORIDE 0.9 % IV BOLUS (SEPSIS)
1000.0000 mL | Freq: Once | INTRAVENOUS | Status: AC
  Administered 2012-04-21: 1000 mL via INTRAVENOUS

## 2012-04-21 MED ORDER — ONDANSETRON HCL 4 MG/2ML IJ SOLN
4.0000 mg | Freq: Once | INTRAMUSCULAR | Status: AC
  Administered 2012-04-21: 4 mg via INTRAVENOUS
  Filled 2012-04-21: qty 2

## 2012-04-21 NOTE — ED Provider Notes (Signed)
History     CSN: [id number]  Arrival date & time 04/21/12  1225   First MD Initiated Contact with Patient 04/21/12 1255      Chief Complaint  Patient presents with  . Nausea     The history is provided by the patient.   the patient reports nausea x3 days without vomiting.  She has had one loose stool.  She denies fevers or chills.  She denies abdominal pain.  She has no chest pain shortness of breath.  She reports a long-standing history of intermittent nausea vomiting diarrhea.  She reports last time it was secondary to some thyroid dysfunction which now seems to be corrected by her primary care physician.  She denies blood in her stool.  No sick contacts.  Nothing worsens or improves her symptoms.  Symptoms are constant and moderate in severity  Past Medical History  Diagnosis Date  . Essential hypertension, benign   . Type 2 diabetes mellitus   . Hypothyroidism   . Mixed hyperlipidemia   . Bipolar disorder   . Hx of colonic polyps   . Stress incontinence, female   . S/P colonoscopy Jan 2011    RMR: pancolonic diverticula, polypoid rectal mucosa with  prominent lymphoid aggregates, repeat in Jan 2016   . Irritable bowel syndrome     Past Surgical History  Procedure Date  . Bilateral tubal ligation   . Colonoscopy 11/17/2009    normal rectum/  . Esophagogastroduodenoscopy 11/23/2011    Procedure: ESOPHAGOGASTRODUODENOSCOPY (EGD);  Surgeon: Corbin Ade, MD;  Location: [facility];  Service: Endoscopy;  Laterality: N/A;  9:00  . Maloney dilation 11/23/2011    Procedure: Elease Hashimoto DILATION;  Surgeon: Corbin Ade, MD;  Location: [facility];  Service: Endoscopy;  Laterality: N/A;    Family History  Problem Relation Age of Onset  . Coronary artery disease    . Diabetes type II    . Schizophrenia Brother   . Stroke Brother   . Cancer Mother     unsure what type  . Heart disease Father   . Heart attack Father   . Colon cancer Neg Hx     History  Substance Use  Topics  . Smoking status: Current Everyday Smoker -- 2.0 packs/day for 31 years    Types: Cigarettes  . Smokeless tobacco: Never Used   Comment: since age 57  . Alcohol Use: No    OB History    Grav Para Term Preterm Abortions TAB SAB Ect Mult Living                  Review of Systems  All other systems reviewed and are negative.    Allergies  Review of patient's allergies indicates no known allergies.  Home Medications   Current Outpatient Rx  Name Route Sig Dispense Refill  . ALBUTEROL SULFATE HFA 108 (90 BASE) MCG/ACT IN AERS Inhalation Inhale 2 puffs into the lungs every 6 (six) hours as needed. For shortness of breath     . ASPIRIN 81 MG PO TBEC Oral Take 81 mg by mouth daily.      Marland Kitchen CETIRIZINE HCL 10 MG PO TABS Oral Take 10 mg by mouth daily.      . FUROSEMIDE 20 MG PO TABS Oral Take 20 mg by mouth daily.     Marland Kitchen GABAPENTIN 100 MG PO CAPS Oral Take 100 mg by mouth 3 (three) times daily.     . INSULIN ASPART 100 UNIT/ML [place]  SOLN Subcutaneous Inject 20-30 Units into the skin 3 (three) times daily as needed. Based on sliding scale    . INSULIN GLARGINE 100 UNIT/ML [place] SOLN Subcutaneous Inject 60 Units into the skin at bedtime as needed. If needed for blood sugar control.    Marland Kitchen LEVOTHYROXINE SODIUM 125 MCG PO TABS Oral Take 125 mcg by mouth daily.     Marland Kitchen LISINOPRIL 20 MG PO TABS Oral Take 20 mg by mouth daily.      . IMODIUM A-D PO Oral Take 1 tablet by mouth every 6 (six) hours as needed. For diarrhea    . LORAZEPAM 0.5 MG PO TABS Oral Take 0.5 mg by mouth 2 (two) times daily as needed. For anxiety    . METFORMIN HCL 500 MG PO TABS Oral Take 500 mg by mouth 2 (two) times daily with a meal.     . PAROXETINE HCL 20 MG PO TABS Oral Take 20 mg by mouth 2 (two) times daily.     Marland Kitchen SIMVASTATIN 40 MG PO TABS Oral Take 40 mg by mouth at bedtime.     . SOLIFENACIN SUCCINATE 5 MG PO TABS Oral Take 5 mg by mouth daily.     Marland Kitchen ONDANSETRON HCL 4 MG PO TABS Oral Take 1 tablet (4 mg total) by  mouth every 6 (six) hours. 12 tablet 0    BP 126/79  Pulse 96  Temp 97.3 F (36.3 C) (Oral)  Resp 20  Ht 5\' 7"  (1.702 m)  Wt 211 lb (95.709 kg)  BMI 33.05 kg/m2  SpO2 95%  Physical Exam  Nursing note and vitals reviewed. Constitutional: She is oriented to person, place, and time. She appears well-developed and well-nourished. No distress.  HENT:  Head: Normocephalic and atraumatic.  Eyes: EOM are normal.  Neck: Normal range of motion.  Cardiovascular: Normal rate, regular rhythm and normal heart sounds.   Pulmonary/Chest: Effort normal and breath sounds normal.  Abdominal: Soft. She exhibits no distension. There is no tenderness.  Musculoskeletal: Normal range of motion.  Neurological: She is alert and oriented to person, place, and time.  Skin: Skin is warm and dry.  Psychiatric: She has a normal mood and affect. Judgment normal.    ED Course  Procedures (including critical care time)  Labs Reviewed  URINALYSIS, ROUTINE W REFLEX MICROSCOPIC - Abnormal; Notable for the following:    Specific Gravity, Urine <1.005 (*)     All other components within normal limits  CBC - Abnormal; Notable for the following:    Hemoglobin 15.1 (*)     All other components within normal limits  BASIC METABOLIC PANEL - Abnormal; Notable for the following:    Glucose, Bld 130 (*)     GFR calc non Af Amer 69 (*)     GFR calc Af Amer 80 (*)     All other components within normal limits  TSH  T4, FREE   No results found.   1. Nausea       MDM  The patient feels much better after antinausea medicine and IV fluids.  Her abdomen remains benign.  Discharge home with antinausea medicine and close primary care followup.  The patient understands to return the emergency department for new or worsening symptoms        Lyanne Co, MD 04/21/12 1433

## 2012-04-21 NOTE — ED Notes (Signed)
Patient states she does not need anything at this time. 

## 2012-04-21 NOTE — ED Notes (Signed)
Nausea, vomiting, diarrhea.for 3 days No fever.  Thinks her thyroid is "messed up".    Has 2 "sores" on my bottom.

## 2012-04-21 NOTE — ED Notes (Signed)
Patient discharged, waiting for ride.

## 2012-04-21 NOTE — Discharge Instructions (Signed)
Nausea, Adult Nausea is the feeling that you have an upset stomach or have to vomit. Nausea by itself is not likely a serious concern, but it may be an early sign of more serious medical problems. As nausea gets worse, it can lead to vomiting. If vomiting develops, there is the risk of dehydration.  CAUSES   Viral infections.   Food poisoning.   Medicines.   Pregnancy.   Motion sickness.   Migraine headaches.   Emotional distress.   Severe pain from any source.   Alcohol intoxication.  HOME CARE INSTRUCTIONS  Get plenty of rest.   Ask your caregiver about specific rehydration instructions.   Eat small amounts of food and sip liquids more often.   Take all medicines as told by your caregiver.  SEEK MEDICAL CARE IF:  You have not improved after 2 days, or you get worse.   You have a headache.  SEEK IMMEDIATE MEDICAL CARE IF:   You have a fever.   You faint.   You keep vomiting or have blood in your vomit.   You are extremely weak or dehydrated.   You have dark or bloody stools.   You have severe chest or abdominal pain.  MAKE SURE YOU:  Understand these instructions.   Will watch your condition.   Will get help right away if you are not doing well or get worse.  Document Released: 12/02/2004 Document Revised: 10/14/2011 Document Reviewed: 07/07/2011 [employer] Patient Information 2012 [employer]. 

## 2012-08-24 ENCOUNTER — Other Ambulatory Visit (HOSPITAL_COMMUNITY): Payer: Self-pay | Admitting: Internal Medicine

## 2012-08-24 DIAGNOSIS — Z139 Encounter for screening, unspecified: Secondary | ICD-10-CM

## 2012-08-29 ENCOUNTER — Ambulatory Visit (HOSPITAL_COMMUNITY): Payer: Medicaid Other

## 2012-09-09 ENCOUNTER — Encounter (HOSPITAL_COMMUNITY): Payer: Self-pay | Admitting: *Deleted

## 2012-09-09 ENCOUNTER — Emergency Department (HOSPITAL_COMMUNITY): Payer: Medicaid Other

## 2012-09-09 ENCOUNTER — Inpatient Hospital Stay (HOSPITAL_COMMUNITY)
Admission: EM | Admit: 2012-09-09 | Discharge: 2012-09-12 | DRG: 418 | Disposition: A | Discharge status: Home / Self Care | Payer: Medicaid Other | Attending: Internal Medicine | Admitting: Internal Medicine

## 2012-09-09 DIAGNOSIS — J449 Chronic obstructive pulmonary disease, unspecified: Secondary | ICD-10-CM | POA: Diagnosis present

## 2012-09-09 DIAGNOSIS — F319 Bipolar disorder, unspecified: Secondary | ICD-10-CM | POA: Diagnosis present

## 2012-09-09 DIAGNOSIS — I959 Hypotension, unspecified: Secondary | ICD-10-CM | POA: Diagnosis present

## 2012-09-09 DIAGNOSIS — E119 Type 2 diabetes mellitus without complications: Secondary | ICD-10-CM | POA: Diagnosis present

## 2012-09-09 DIAGNOSIS — K589 Irritable bowel syndrome without diarrhea: Secondary | ICD-10-CM | POA: Diagnosis present

## 2012-09-09 DIAGNOSIS — F17200 Nicotine dependence, unspecified, uncomplicated: Secondary | ICD-10-CM | POA: Diagnosis present

## 2012-09-09 DIAGNOSIS — B961 Klebsiella pneumoniae [K. pneumoniae] as the cause of diseases classified elsewhere: Secondary | ICD-10-CM | POA: Diagnosis present

## 2012-09-09 DIAGNOSIS — N390 Urinary tract infection, site not specified: Secondary | ICD-10-CM | POA: Diagnosis present

## 2012-09-09 DIAGNOSIS — J4489 Other specified chronic obstructive pulmonary disease: Secondary | ICD-10-CM | POA: Diagnosis present

## 2012-09-09 DIAGNOSIS — E782 Mixed hyperlipidemia: Secondary | ICD-10-CM | POA: Diagnosis present

## 2012-09-09 DIAGNOSIS — R109 Unspecified abdominal pain: Secondary | ICD-10-CM

## 2012-09-09 DIAGNOSIS — I10 Essential (primary) hypertension: Secondary | ICD-10-CM | POA: Diagnosis present

## 2012-09-09 DIAGNOSIS — K8010 Calculus of gallbladder with chronic cholecystitis without obstruction: Principal | ICD-10-CM | POA: Diagnosis present

## 2012-09-09 DIAGNOSIS — N393 Stress incontinence (female) (male): Secondary | ICD-10-CM | POA: Diagnosis present

## 2012-09-09 DIAGNOSIS — E876 Hypokalemia: Secondary | ICD-10-CM | POA: Diagnosis present

## 2012-09-09 DIAGNOSIS — K810 Acute cholecystitis: Secondary | ICD-10-CM

## 2012-09-09 LAB — GLUCOSE, CAPILLARY
Glucose-Capillary: 153 mg/dL — ABNORMAL HIGH (ref 70–99)
Glucose-Capillary: 75 mg/dL (ref 70–99)
Glucose-Capillary: 76 mg/dL (ref 70–99)

## 2012-09-09 LAB — CBC WITH DIFFERENTIAL/PLATELET
Basophils Absolute: 0 10*3/uL (ref 0.0–0.1)
Basophils Relative: 0 % (ref 0–1)
Eosinophils Absolute: 0 10*3/uL (ref 0.0–0.7)
Eosinophils Relative: 0 % (ref 0–5)
HCT: 43.3 % (ref 36.0–46.0)
Hemoglobin: 15.2 g/dL — ABNORMAL HIGH (ref 12.0–15.0)
Lymphocytes Relative: 10 % — ABNORMAL LOW (ref 12–46)
Lymphs Abs: 1.5 10*3/uL (ref 0.7–4.0)
MCH: 31 pg (ref 26.0–34.0)
MCHC: 35.1 g/dL (ref 30.0–36.0)
MCV: 88.2 fL (ref 78.0–100.0)
Monocytes Absolute: 0.5 10*3/uL (ref 0.1–1.0)
Monocytes Relative: 3 % (ref 3–12)
Neutro Abs: 13.2 10*3/uL — ABNORMAL HIGH (ref 1.7–7.7)
Neutrophils Relative %: 87 % — ABNORMAL HIGH (ref 43–77)
Platelets: 209 10*3/uL (ref 150–400)
RBC: 4.91 MIL/uL (ref 3.87–5.11)
RDW: 14.2 % (ref 11.5–15.5)
WBC: 15.1 10*3/uL — ABNORMAL HIGH (ref 4.0–10.5)

## 2012-09-09 LAB — URINALYSIS, ROUTINE W REFLEX MICROSCOPIC
Bilirubin Urine: NEGATIVE
Glucose, UA: 100 mg/dL — AB
Hgb urine dipstick: NEGATIVE
Ketones, ur: 15 mg/dL — AB
Leukocytes, UA: NEGATIVE
Nitrite: POSITIVE — AB
Protein, ur: NEGATIVE mg/dL
Specific Gravity, Urine: 1.015 (ref 1.005–1.030)
Urobilinogen, UA: 0.2 mg/dL (ref 0.0–1.0)
pH: 7 (ref 5.0–8.0)

## 2012-09-09 LAB — COMPREHENSIVE METABOLIC PANEL
ALT: 22 U/L (ref 0–35)
AST: 22 U/L (ref 0–37)
Albumin: 4.2 g/dL (ref 3.5–5.2)
Alkaline Phosphatase: 103 U/L (ref 39–117)
BUN: 28 mg/dL — ABNORMAL HIGH (ref 6–23)
CO2: 25 mEq/L (ref 19–32)
Calcium: 10.3 mg/dL (ref 8.4–10.5)
Chloride: 101 mEq/L (ref 96–112)
Creatinine, Ser: 1.07 mg/dL (ref 0.50–1.10)
GFR calc Af Amer: 64 mL/min — ABNORMAL LOW (ref >90)
GFR calc non Af Amer: 55 mL/min — ABNORMAL LOW (ref >90)
Glucose, Bld: 209 mg/dL — ABNORMAL HIGH (ref 70–99)
Potassium: 3.4 mEq/L — ABNORMAL LOW (ref 3.5–5.1)
Sodium: 139 mEq/L (ref 135–145)
Total Bilirubin: 0.5 mg/dL (ref 0.3–1.2)
Total Protein: 7.3 g/dL (ref 6.0–8.3)

## 2012-09-09 LAB — URINE MICROSCOPIC-ADD ON

## 2012-09-09 LAB — MRSA PCR SCREENING: MRSA by PCR: NEGATIVE

## 2012-09-09 LAB — LIPASE, BLOOD: Lipase: 25 U/L (ref 11–59)

## 2012-09-09 IMAGING — CT CT ABD-PELV W/ CM
2 of 4 series · 16 of 46 positions shown, 18 images · IV contrast (Omnipaque 300)
Comparison: CT of the abdomen and pelvis [DATE].

CLINICAL DATA: Nausea and vomiting. Right upper quadrant abdominal
pain.

CT ABDOMEN AND PELVIS WITH CONTRAST
TECHNIQUE: Multidetector CT imaging of the abdomen and pelvis was
performed following the standard protocol during bolus
administration of intravenous contrast.
Contrast:  100 ml of [TQ].

[Series 2: abd_pel_with 5.0 b40f · axial · 0.80mm/px · z∈[-494,-74]mm · 13 of 94 slices shown, 15 images]
[im 5/94  soft-tissue]
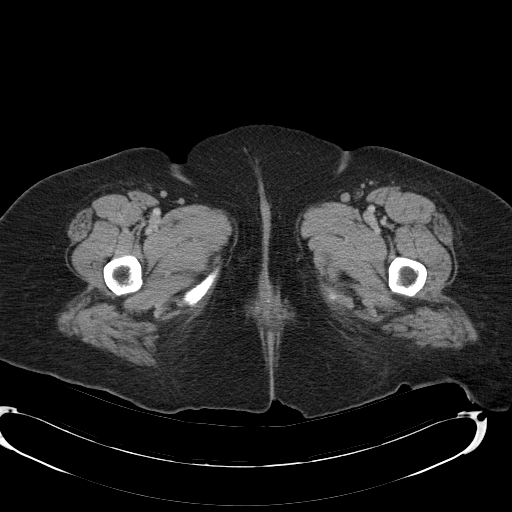
[im 5/94  bone]
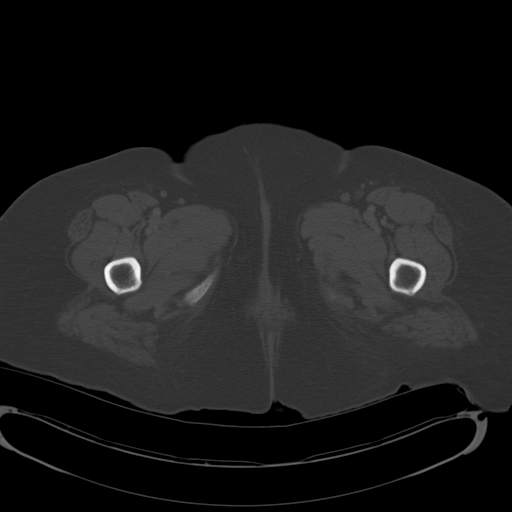
[im 14/94  soft-tissue]
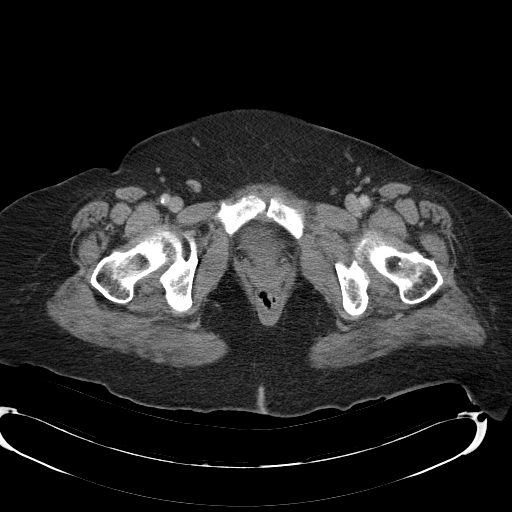
[im 19/94  soft-tissue]
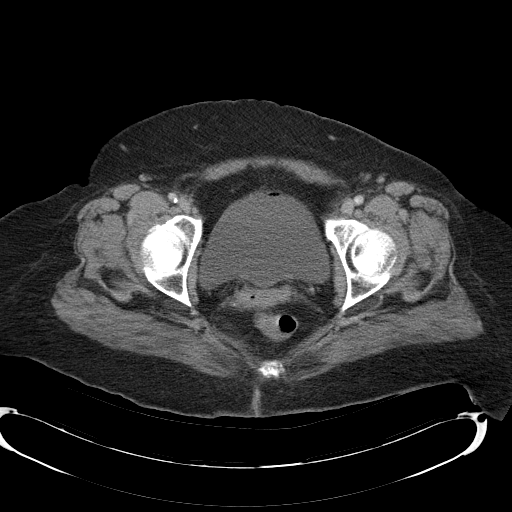
[im 28/94  soft-tissue]
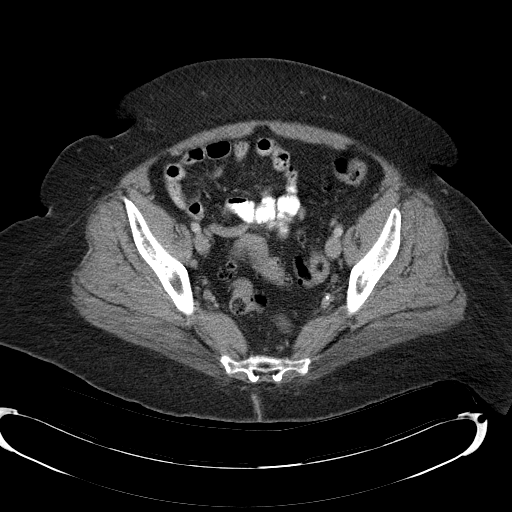
[im 33/94  soft-tissue]
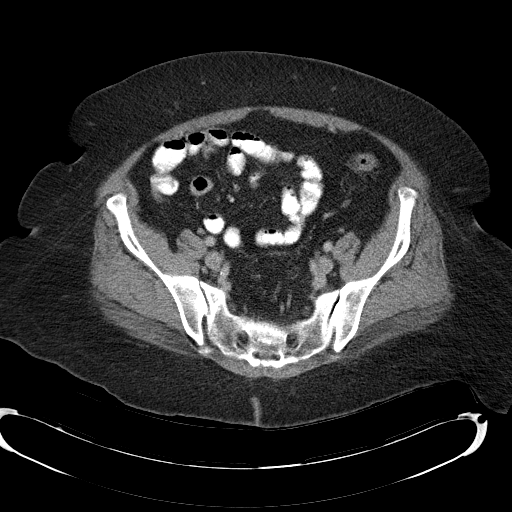
[im 42/94  soft-tissue]
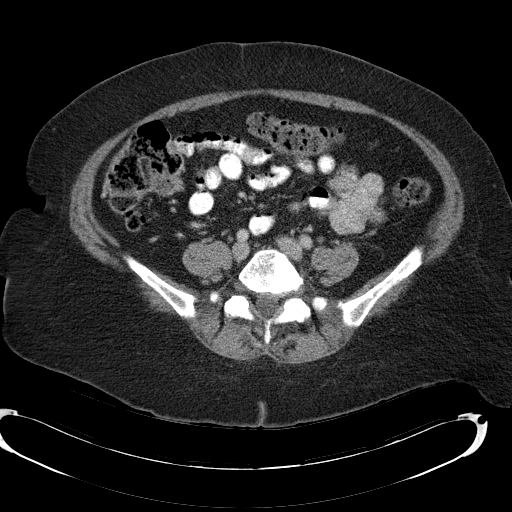
[im 47/94  soft-tissue]
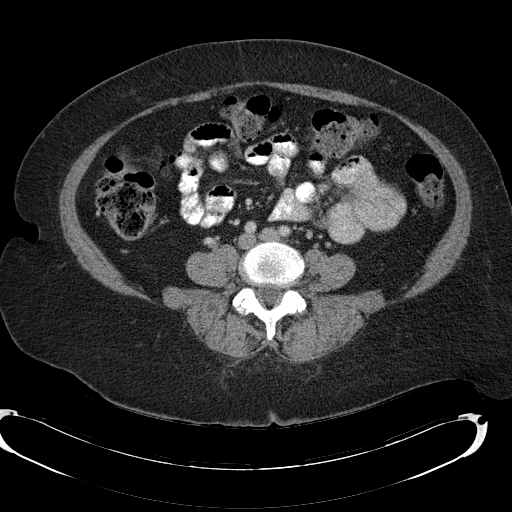
[im 52/94  soft-tissue]
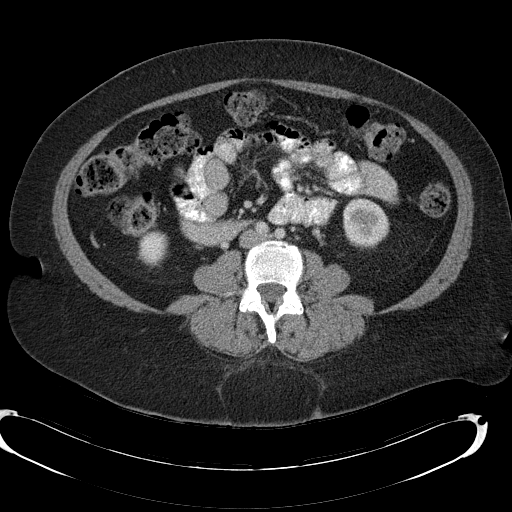
[im 61/94  soft-tissue]
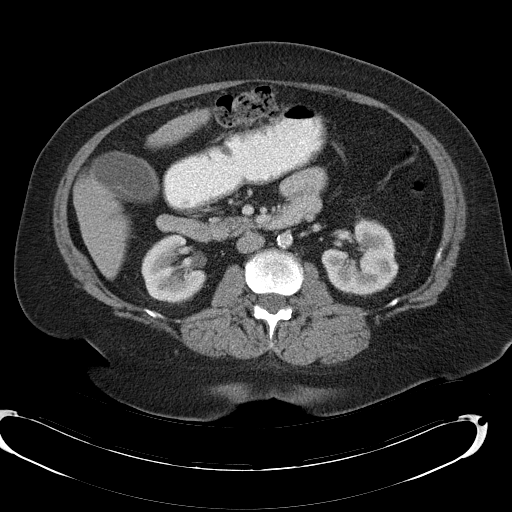
[im 61/94  bone]
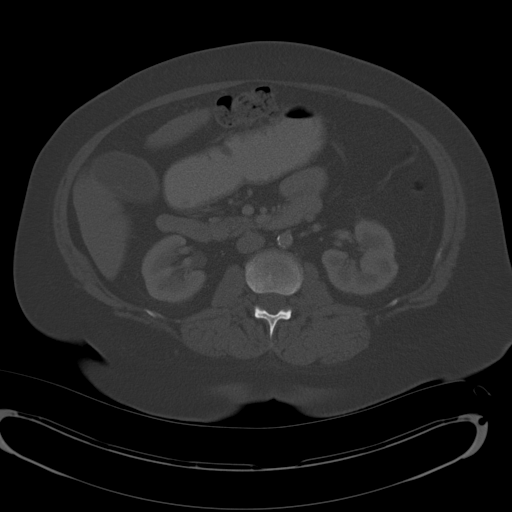
[im 66/94  soft-tissue]
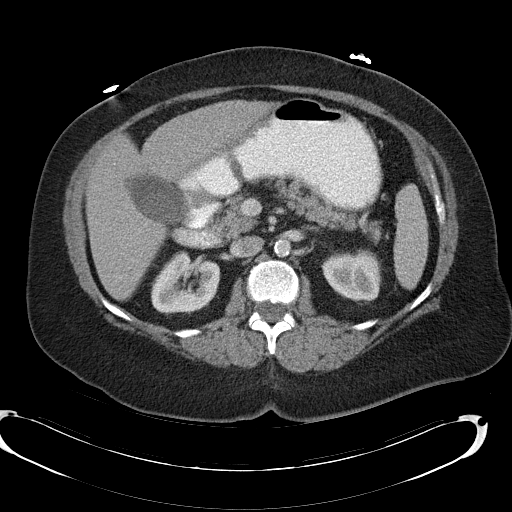
[im 75/94  soft-tissue]
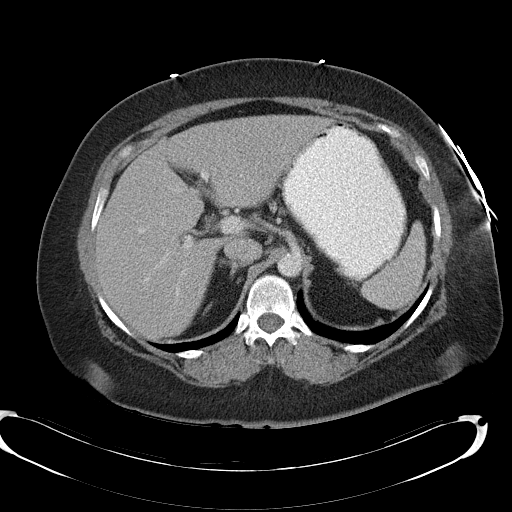
[im 80/94  soft-tissue]
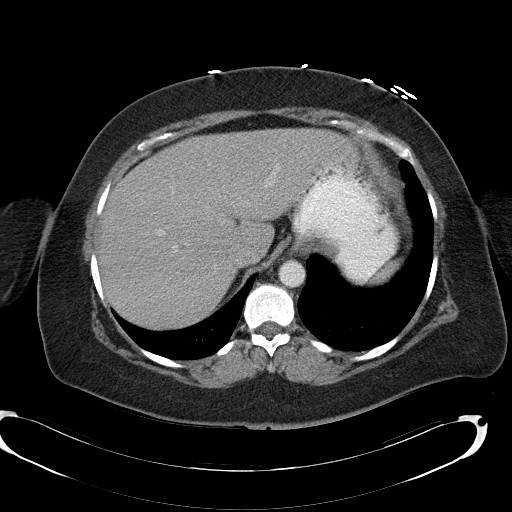
[im 89/94  soft-tissue]
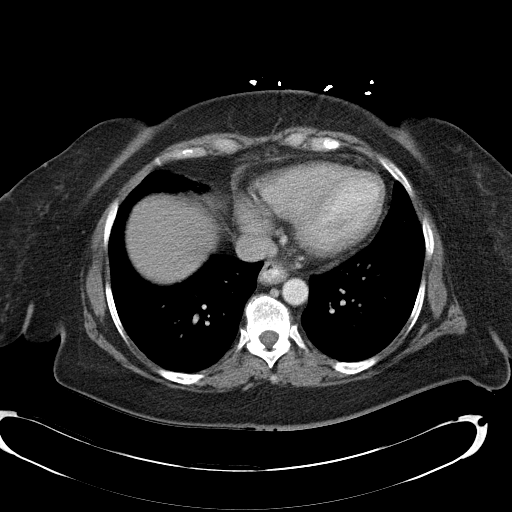

[Series 4: abd_pel_with 3.0 spo cor · coronal · 0.82mm/px · 3 of 90 slices shown]
[im 30/90  soft-tissue]
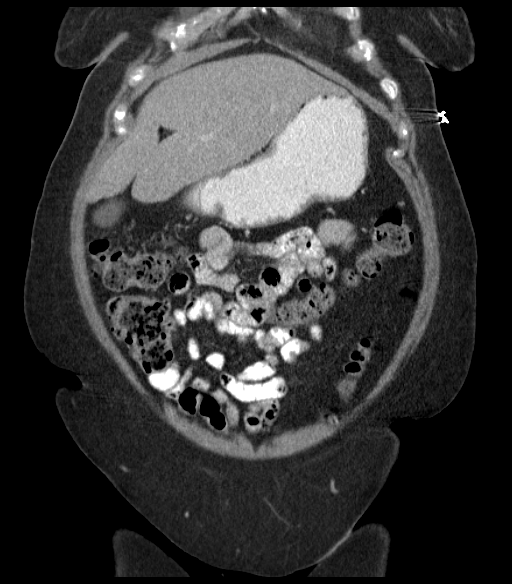
[im 40/90  soft-tissue]
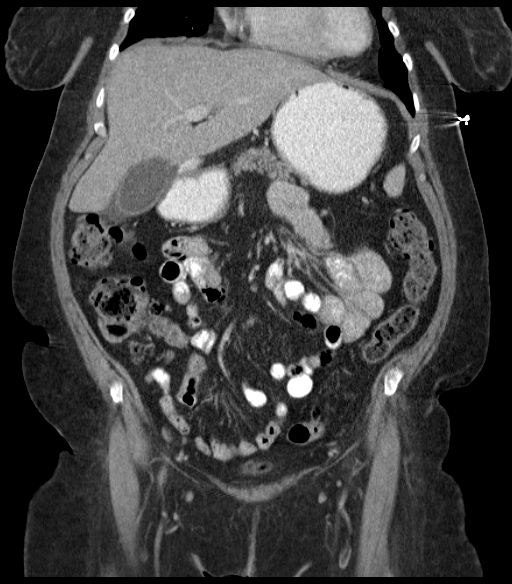
[im 50/90  soft-tissue]
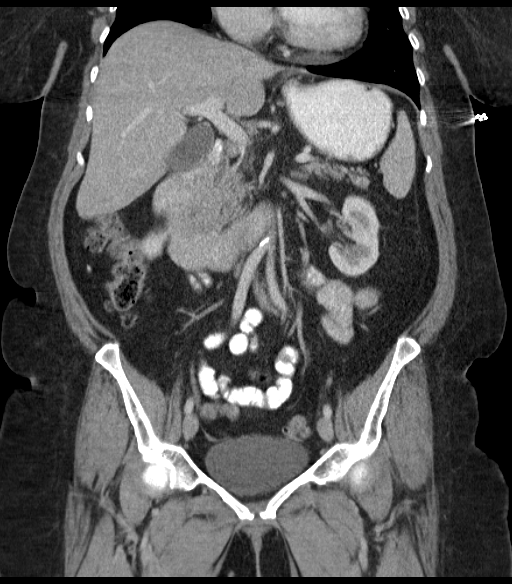

[16 of 46 positions shown; findings below may reference images not displayed]

FINDINGS: Lung Bases: Small hiatal hernia.  Otherwise, unremarkable.

Abdomen/Pelvis:  There are some ill-defined foci of increased
attenuation within the gallbladder, favored to represent
noncalcified gallstones.  Gallbladder is moderately distended.
However, there does not appear to be significant pericholecystic
fluid or stranding to suggest acute cholecystitis at this time.
Numerous tiny calcifications scattered throughout the liver and
spleen are compatible with calcified granulomas.  The appearance of
the pancreas, bilateral adrenal glands and bilateral kidneys is
unremarkable.

Normal appendix.  No ascites or pneumoperitoneum and no pathologic
distension of small bowel.  No definite pathologic lymphadenopathy
identified within the abdomen or pelvis.  There are numerous
colonic diverticula, without surrounding inflammatory changes to
suggest acute diverticulitis at this time.  Uterus and ovaries are
unremarkable in appearance.  Small amount of gas in the
nondependent portion of the urinary bladder is presumably
iatrogenic.

Musculoskeletal: There are no aggressive appearing lytic or blastic
lesions noted in the visualized portions of the skeleton.
IMPRESSION: 1.  Cholelithiasis without evidence to suggest acute cholecystitis
at this time.
2.  Colonic diverticulosis without findings to suggest acute
diverticulitis at this time.
3.  Normal appendix.
4.  Small hiatal hernia.
5.  Small amount of gas in the nondependent portion of the urinary
bladder.  This is presumably iatrogenic, however, in the absence of
recent bladder catheterization, clinical correlation for signs and
symptoms of urinary tract infection may be warranted, as this may
alternatively reflect infection with gas forming organisms.

## 2012-09-09 MED ORDER — INSULIN ASPART 100 UNIT/ML ~~LOC~~ SOLN
0.0000 [IU] | SUBCUTANEOUS | Status: DC
  Administered 2012-09-10: 3 [IU] via SUBCUTANEOUS
  Administered 2012-09-10 – 2012-09-11 (×6): 2 [IU] via SUBCUTANEOUS

## 2012-09-09 MED ORDER — HYDROMORPHONE HCL PF 1 MG/ML IJ SOLN: 1.0000 mg | INTRAMUSCULAR | Status: DC | PRN

## 2012-09-09 MED ORDER — ONDANSETRON HCL 4 MG/2ML IJ SOLN
4.0000 mg | Freq: Once | INTRAMUSCULAR | Status: AC
  Administered 2012-09-09: 4 mg via INTRAVENOUS
  Filled 2012-09-09: qty 2

## 2012-09-09 MED ORDER — POTASSIUM CHLORIDE CRYS ER 20 MEQ PO TBCR
20.0000 meq | EXTENDED_RELEASE_TABLET | Freq: Two times a day (BID) | ORAL | Status: DC
  Administered 2012-09-09 (×2): 20 meq via ORAL
  Filled 2012-09-09: qty 1

## 2012-09-09 MED ORDER — SODIUM CHLORIDE 0.9 % IV SOLN: INTRAVENOUS | Status: AC

## 2012-09-09 MED ORDER — HYDROCODONE-ACETAMINOPHEN 5-325 MG PO TABS
1.0000 | ORAL_TABLET | ORAL | Status: DC | PRN
  Administered 2012-09-11 – 2012-09-12 (×3): 1 via ORAL
  Filled 2012-09-09 (×3): qty 1

## 2012-09-09 MED ORDER — ALBUTEROL SULFATE HFA 108 (90 BASE) MCG/ACT IN AERS: 2.0000 | INHALATION_SPRAY | Freq: Four times a day (QID) | RESPIRATORY_TRACT | Status: DC | PRN

## 2012-09-09 MED ORDER — ONDANSETRON HCL 4 MG/2ML IJ SOLN
4.0000 mg | Freq: Four times a day (QID) | INTRAMUSCULAR | Status: DC | PRN
  Administered 2012-09-11: 4 mg via INTRAVENOUS

## 2012-09-09 MED ORDER — LEVOTHYROXINE SODIUM 75 MCG PO TABS
150.0000 ug | ORAL_TABLET | Freq: Every day | ORAL | Status: DC
  Administered 2012-09-10 – 2012-09-11 (×2): 150 ug via ORAL
  Filled 2012-09-09 (×2): qty 2

## 2012-09-09 MED ORDER — PANTOPRAZOLE SODIUM 40 MG IV SOLR
INTRAVENOUS | Status: AC
  Administered 2012-09-09: 40 mg via INTRAVENOUS
  Filled 2012-09-09: qty 40

## 2012-09-09 MED ORDER — ENOXAPARIN SODIUM 40 MG/0.4ML ~~LOC~~ SOLN
40.0000 mg | SUBCUTANEOUS | Status: DC
  Administered 2012-09-09 – 2012-09-11 (×3): 40 mg via SUBCUTANEOUS
  Filled 2012-09-09 (×2): qty 0.4

## 2012-09-09 MED ORDER — HYDROMORPHONE HCL PF 1 MG/ML IJ SOLN: 1.0000 mg | INTRAMUSCULAR | Status: AC | PRN

## 2012-09-09 MED ORDER — PANTOPRAZOLE SODIUM 40 MG IV SOLR
40.0000 mg | INTRAVENOUS | Status: DC
  Administered 2012-09-09 – 2012-09-11 (×3): 40 mg via INTRAVENOUS
  Filled 2012-09-09 (×2): qty 40

## 2012-09-09 MED ORDER — LORAZEPAM 0.5 MG PO TABS
0.5000 mg | ORAL_TABLET | ORAL | Status: DC | PRN
  Administered 2012-09-11: 0.5 mg via ORAL
  Filled 2012-09-09: qty 1

## 2012-09-09 MED ORDER — SODIUM CHLORIDE 0.9 % IV SOLN
INTRAVENOUS | Status: DC
  Administered 2012-09-09 – 2012-09-10 (×4): via INTRAVENOUS

## 2012-09-09 MED ORDER — ONDANSETRON HCL 4 MG/2ML IJ SOLN: 4.0000 mg | INTRAMUSCULAR | Status: AC | PRN

## 2012-09-09 MED ORDER — ONDANSETRON HCL 4 MG PO TABS: 4.0000 mg | ORAL_TABLET | Freq: Four times a day (QID) | ORAL | Status: DC | PRN

## 2012-09-09 MED ORDER — ACETAMINOPHEN 650 MG RE SUPP: 650.0000 mg | Freq: Four times a day (QID) | RECTAL | Status: DC | PRN

## 2012-09-09 MED ORDER — ACETAMINOPHEN 325 MG PO TABS: 650.0000 mg | ORAL_TABLET | Freq: Four times a day (QID) | ORAL | Status: DC | PRN

## 2012-09-09 MED ORDER — SODIUM CHLORIDE 0.9 % IV SOLN
Freq: Once | INTRAVENOUS | Status: AC
  Administered 2012-09-09: 13:00:00 via INTRAVENOUS

## 2012-09-09 MED ORDER — CEFTRIAXONE SODIUM 1 G IJ SOLR
1.0000 g | INTRAMUSCULAR | Status: DC
  Administered 2012-09-09 – 2012-09-11 (×3): 1 g via INTRAVENOUS
  Filled 2012-09-09 (×4): qty 10

## 2012-09-09 MED ORDER — DARIFENACIN HYDROBROMIDE ER 7.5 MG PO TB24
7.5000 mg | ORAL_TABLET | Freq: Every day | ORAL | Status: DC
  Administered 2012-09-10 – 2012-09-12 (×2): 7.5 mg via ORAL
  Filled 2012-09-09 (×4): qty 1

## 2012-09-09 MED ORDER — INSULIN ASPART 100 UNIT/ML ~~LOC~~ SOLN
0.0000 [IU] | Freq: Three times a day (TID) | SUBCUTANEOUS | Status: DC
  Administered 2012-09-09: 3 [IU] via SUBCUTANEOUS

## 2012-09-09 MED ORDER — ALUM & MAG HYDROXIDE-SIMETH 200-200-20 MG/5ML PO SUSP: 30.0000 mL | Freq: Four times a day (QID) | ORAL | Status: DC | PRN

## 2012-09-09 MED ORDER — INSULIN GLARGINE 100 UNIT/ML ~~LOC~~ SOLN
5.0000 [IU] | Freq: Every day | SUBCUTANEOUS | Status: DC
  Administered 2012-09-09 – 2012-09-11 (×3): 5 [IU] via SUBCUTANEOUS

## 2012-09-09 MED ORDER — IOHEXOL 300 MG/ML  SOLN: 100.0000 mL | Freq: Once | INTRAMUSCULAR | Status: AC | PRN

## 2012-09-09 MED ORDER — INSULIN ASPART 100 UNIT/ML ~~LOC~~ SOLN: 0.0000 [IU] | Freq: Every day | SUBCUTANEOUS | Status: DC

## 2012-09-09 MED ORDER — GABAPENTIN 100 MG PO CAPS
ORAL_CAPSULE | ORAL | Status: AC
  Filled 2012-09-09: qty 2

## 2012-09-09 MED ORDER — ENOXAPARIN SODIUM 40 MG/0.4ML ~~LOC~~ SOLN
SUBCUTANEOUS | Status: AC
  Filled 2012-09-09: qty 0.4

## 2012-09-09 MED ORDER — GABAPENTIN 100 MG PO CAPS
100.0000 mg | ORAL_CAPSULE | Freq: Three times a day (TID) | ORAL | Status: DC
  Administered 2012-09-09 – 2012-09-12 (×8): 100 mg via ORAL
  Filled 2012-09-09 (×13): qty 1

## 2012-09-09 MED ORDER — DEXTROSE 5 % IV SOLN
INTRAVENOUS | Status: AC
  Filled 2012-09-09: qty 10

## 2012-09-09 MED ORDER — HYDROMORPHONE HCL PF 1 MG/ML IJ SOLN
1.0000 mg | Freq: Once | INTRAMUSCULAR | Status: AC
  Administered 2012-09-09: 1 mg via INTRAVENOUS
  Filled 2012-09-09: qty 1

## 2012-09-09 MED ORDER — PAROXETINE HCL 20 MG PO TABS
20.0000 mg | ORAL_TABLET | Freq: Every day | ORAL | Status: DC
  Administered 2012-09-10 – 2012-09-12 (×3): 20 mg via ORAL
  Filled 2012-09-09 (×3): qty 1

## 2012-09-09 MED ORDER — POTASSIUM CHLORIDE CRYS ER 20 MEQ PO TBCR
EXTENDED_RELEASE_TABLET | ORAL | Status: AC
  Administered 2012-09-09: 20 meq via ORAL
  Filled 2012-09-09: qty 1

## 2012-09-09 NOTE — H&P (Signed)
Claudia Keller MRN: [id number] DOB/AGE: 1951-09-16 61 y.o. Primary Care Physician:FANTA,TESFAYE, MD Admit date: 09/09/2012 Chief Complaint: Abdominal pain HPI: She says she started having abdominal pain about midnight. This is been intense and she rates it about an 8/10. She's had nausea but no vomiting. The pain is rather diffuse in her abdomen. She has not had any fever or chills.  she has not had any chest pain.  Past Medical History  Diagnosis Date  . Essential hypertension, benign   . Type 2 diabetes mellitus   . Hypothyroidism   . Mixed hyperlipidemia   . Bipolar disorder   . Hx of colonic polyps   . Stress incontinence, female   . S/P colonoscopy Jan 2011    RMR: pancolonic diverticula, polypoid rectal mucosa with  prominent lymphoid aggregates, repeat in Jan 2016   . Irritable bowel syndrome    Past Surgical History  Procedure Date  . Bilateral tubal ligation   . Colonoscopy 11/17/2009    normal rectum/  . Esophagogastroduodenoscopy 11/23/2011    Procedure: ESOPHAGOGASTRODUODENOSCOPY (EGD);  Surgeon: Corbin Ade, MD;  Location: [facility];  Service: Endoscopy;  Laterality: N/A;  9:00  . Maloney dilation 11/23/2011    Procedure: Elease Hashimoto DILATION;  Surgeon: Corbin Ade, MD;  Location: [facility];  Service: Endoscopy;  Laterality: N/A;        Family History  Problem Relation Age of Onset  . Coronary artery disease    . Diabetes type II    . Schizophrenia Brother   . Stroke Brother   . Cancer Mother     unsure what type  . Heart disease Father   . Heart attack Father   . Colon cancer Neg Hx     Social History:  reports that she has been smoking Cigarettes.  She has a 62 pack-year smoking history. She has never used smokeless tobacco. She reports that she does not drink alcohol or use illicit drugs.   Allergies: No Known Allergies   (Not in a hospital admission)     WUJ:WJXBJ from the symptoms mentioned above,there are no other symptoms referable  to all systems reviewed.  Physical Exam: Blood pressure 152/59, pulse 67, temperature 98 F (36.7 C), resp. rate 20, height 5\' 7"  (1.702 m), weight 95.255 kg (210 lb), SpO2 95.00%. She is awake and alert. She complains of pain. Her pupils are reactive. Her nose and throat are clear. Her neck is supple. Her chest is clear. Her heart is regular without gallop. Her mucous membranes are slightly dry. Her abdomen is soft with mild diffuse tenderness. Bowel sounds present and active. Her abdomen appears to be somewhat bloated. Her extremities show no pedal edema. No clubbing or cyanosis. Her central nervous system exam is grossly intact    Basename 09/09/12 1206  WBC 15.1*  NEUTROABS 13.2*  HGB 15.2*  HCT 43.3  MCV 88.2  PLT 209    Basename 09/09/12 1206  NA 139  K 3.4*  CL 101  CO2 25  GLUCOSE 209*  BUN 28*  CREATININE 1.07  CALCIUM 10.3  MG --  lablast2(ast:2,ALT:2,alkphos:2,bilitot:2,prot:2,albumin:2)@    No results found for this or any previous visit (from the past 240 hour(s)).   Ct Abdomen Pelvis W Contrast  09/09/2012  *RADIOLOGY REPORT*  Clinical Data: Nausea and vomiting. Right upper quadrant abdominal pain.  CT ABDOMEN AND PELVIS WITH CONTRAST  Technique:  Multidetector CT imaging of the abdomen and pelvis was performed following the standard protocol during bolus  administration of intravenous contrast.  Contrast:  100 ml of Omnipaque-300.  Comparison: CT of the abdomen and pelvis 08/13/2010.  Findings:  Lung Bases: Small hiatal hernia.  Otherwise, unremarkable.  Abdomen/Pelvis:  There are some ill-defined foci of increased attenuation within the gallbladder, favored to represent noncalcified gallstones.  Gallbladder is moderately distended. However, there does not appear to be significant pericholecystic fluid or stranding to suggest acute cholecystitis at this time. Numerous tiny calcifications scattered throughout the liver and spleen are compatible with calcified  granulomas.  The appearance of the pancreas, bilateral adrenal glands and bilateral kidneys is unremarkable.  Normal appendix.  No ascites or pneumoperitoneum and no pathologic distension of small bowel.  No definite pathologic lymphadenopathy identified within the abdomen or pelvis.  There are numerous colonic diverticula, without surrounding inflammatory changes to suggest acute diverticulitis at this time.  Uterus and ovaries are unremarkable in appearance.  Small amount of gas in the nondependent portion of the urinary bladder is presumably iatrogenic.  Musculoskeletal: There are no aggressive appearing lytic or blastic lesions noted in the visualized portions of the skeleton.  IMPRESSION: 1.  Cholelithiasis without evidence to suggest acute cholecystitis at this time. 2.  Colonic diverticulosis without findings to suggest acute diverticulitis at this time. 3.  Normal appendix. 4.  Small hiatal hernia. 5.  Small amount of gas in the nondependent portion of the urinary bladder.  This is presumably iatrogenic, however, in the absence of recent bladder catheterization, clinical correlation for signs and symptoms of urinary tract infection may be warranted, as this may alternatively reflect infection with gas forming organisms.   Original Report Authenticated By: Trudie Reed, M.D.    Impression: She has abdominal pain and stones in the gallbladder which may indicate that she has cholecystitis. Her white count is up at 15,000. Her her lipase is normal. She is slightly hypokalemic. She has diabetes. She probably has COPD. She has hypertension but since her blood pressure is slightly low I am going to have her stay off of blood pressure meds for the moment. Active Problems:  * No active hospital problems. *      Plan: Because her blood pressure is slightly low I am going to admit her to the step down unit. She will continue all of her other treatments. She will need some potassium replacement. She will  continue with everything else. I will start her on Rocephin. She will have a surgical consultation      Lylliana Kitamura L Pager [phone]  09/09/2012, 3:24 PM

## 2012-09-09 NOTE — ED Provider Notes (Cosign Needed)
History  This chart was scribed for Claudia Cooper III, MD by Erskine Emery. This patient was seen in room [facility] and the patient's care was started at 11:45.   CSN: [id number]  Arrival date & time 09/09/12  1057   First MD Initiated Contact with Patient 09/09/12 1145      Chief Complaint  Patient presents with  . Abdominal Pain    (Consider location/radiation/quality/duration/timing/severity/associated sxs/prior treatment) The history is provided by the patient. No language interpreter was used.  Claudia Keller is a 61 y.o. female who presents to the Emergency Department complaining of sudden onset nausea, emesis, chest pain, and abdominal pain that radiates to the right side since around midnight last night. Pt denies any associated diarrhea or known aggravators. Pt reports eating but she doesn't think the food made her sick. Pt denies any previous episodes of similar symptoms. Pt reports she had a recent change in medication. Pt has a h/o DM, HTN, hypothyroidism, hyperlipidemia, bipolar disorder, colonic polyps, neuropathy in the feet, and irritable bowel syndrome and a procedural h/o tubal ligation, esophagogastromyotomy, maloney dilation, and colonoscopy. Pt reports she smokes about a pack of cigarettes/day but she denies drinking alcohol.   Dr. Felecia Shelling is the pt's PCP.   Past Medical History  Diagnosis Date  . Essential hypertension, benign   . Type 2 diabetes mellitus   . Hypothyroidism   . Mixed hyperlipidemia   . Bipolar disorder   . Hx of colonic polyps   . Stress incontinence, female   . S/P colonoscopy Jan 2011    RMR: pancolonic diverticula, polypoid rectal mucosa with  prominent lymphoid aggregates, repeat in Jan 2016   . Irritable bowel syndrome     Past Surgical History  Procedure Date  . Bilateral tubal ligation   . Colonoscopy 11/17/2009    normal rectum/  . Esophagogastroduodenoscopy 11/23/2011    Procedure: ESOPHAGOGASTRODUODENOSCOPY (EGD);  Surgeon: Corbin Ade, MD;  Location: [facility];  Service: Endoscopy;  Laterality: N/A;  9:00  . Maloney dilation 11/23/2011    Procedure: Elease Hashimoto DILATION;  Surgeon: Corbin Ade, MD;  Location: [facility];  Service: Endoscopy;  Laterality: N/A;    Family History  Problem Relation Age of Onset  . Coronary artery disease    . Diabetes type II    . Schizophrenia Brother   . Stroke Brother   . Cancer Mother     unsure what type  . Heart disease Father   . Heart attack Father   . Colon cancer Neg Hx     History  Substance Use Topics  . Smoking status: Current Every Day Smoker -- 2.0 packs/day for 31 years    Types: Cigarettes  . Smokeless tobacco: Never Used   Comment: since age 81  . Alcohol Use: No    OB History    Grav Para Term Preterm Abortions TAB SAB Ect Mult Living                  Review of Systems  Constitutional: Negative for fatigue.  HENT: Negative for congestion.   Eyes: Negative for discharge.  Respiratory: Negative for cough.   Cardiovascular: Positive for chest pain.  Gastrointestinal: Positive for nausea, vomiting and abdominal pain. Negative for diarrhea.  Genitourinary: Negative for hematuria.  Musculoskeletal: Negative for back pain.  Skin: Negative for rash.  Neurological: Negative for seizures.  Hematological: Negative.   Psychiatric/Behavioral: Negative for hallucinations.  All other systems reviewed and are negative.  Allergies  Review of patient's allergies indicates no known allergies.  Home Medications   Current Outpatient Rx  Name Route Sig Dispense Refill  . ALBUTEROL SULFATE HFA 108 (90 BASE) MCG/ACT IN AERS Inhalation Inhale 2 puffs into the lungs every 6 (six) hours as needed. For shortness of breath     . ASPIRIN 81 MG PO TBEC Oral Take 81 mg by mouth daily.      Marland Kitchen CETIRIZINE HCL 10 MG PO TABS Oral Take 10 mg by mouth daily.      . FUROSEMIDE 20 MG PO TABS Oral Take 20 mg by mouth daily.     Marland Kitchen GABAPENTIN 100 MG PO CAPS Oral Take  100 mg by mouth 3 (three) times daily.     . INSULIN ASPART 100 UNIT/ML [place] SOLN Subcutaneous Inject 20-30 Units into the skin 3 (three) times daily as needed. Based on sliding scale    . INSULIN GLARGINE 100 UNIT/ML [place] SOLN Subcutaneous Inject 60 Units into the skin at bedtime as needed. If needed for blood sugar control.    Marland Kitchen LEVOTHYROXINE SODIUM 125 MCG PO TABS Oral Take 125 mcg by mouth daily.     Marland Kitchen LISINOPRIL 20 MG PO TABS Oral Take 20 mg by mouth daily.      . IMODIUM A-D PO Oral Take 1 tablet by mouth every 6 (six) hours as needed. For diarrhea    . LORAZEPAM 0.5 MG PO TABS Oral Take 0.5 mg by mouth 2 (two) times daily as needed. For anxiety    . METFORMIN HCL 500 MG PO TABS Oral Take 500 mg by mouth 2 (two) times daily with a meal.     . PAROXETINE HCL 20 MG PO TABS Oral Take 20 mg by mouth 2 (two) times daily.     Marland Kitchen SIMVASTATIN 40 MG PO TABS Oral Take 40 mg by mouth at bedtime.     . SOLIFENACIN SUCCINATE 5 MG PO TABS Oral Take 5 mg by mouth daily.       Triage Vitals: BP 150/66  Pulse 67  Temp 98 F (36.7 C)  Resp 20  Ht 5\' 7"  (1.702 m)  Wt 210 lb (95.255 kg)  BMI 32.89 kg/m2  SpO2 95%  Physical Exam  Nursing note and vitals reviewed. Constitutional: She is oriented to person, place, and time. She appears well-developed and well-nourished. No distress.       Pale with appearance of chronic illness. Pt is awake and alert.  HENT:  Head: Normocephalic and atraumatic.       Mouth is dry.  Eyes: EOM are normal. Pupils are equal, round, and reactive to light.  Neck: Neck supple. No tracheal deviation present.  Cardiovascular: Normal rate, regular rhythm and normal heart sounds.   No murmur heard. Pulmonary/Chest: Effort normal and breath sounds normal. No respiratory distress. She has no wheezes.       Chest is clear.  Abdominal: Soft. She exhibits no distension and no mass. There is tenderness. There is no rebound.       RUQ tenderness  Musculoskeletal: Normal range of  motion. She exhibits no edema.       Extremities are normal, with sensation and motor intact.  Lymphadenopathy:    She has no cervical adenopathy.  Neurological: She is alert and oriented to person, place, and time. No cranial nerve deficit. Coordination normal.  Skin: Skin is warm and dry.  Psychiatric: She has a normal mood and affect.    ED Course  Procedures (including critical care time) DIAGNOSTIC STUDIES: Oxygen Saturation is 95% on room air, normal by my interpretation.    COORDINATION OF CARE: 12:07--I evaluated the patient and we discussed a treatment plan including nausea medication and abdominal CT to which the pt agreed.    2:20 PM Results for orders placed during the hospital encounter of 09/09/12  CBC WITH DIFFERENTIAL      Component Value Range   WBC 15.1 (*) 4.0 - 10.5 K/uL   RBC 4.91  3.87 - 5.11 MIL/uL   Hemoglobin 15.2 (*) 12.0 - 15.0 g/dL   HCT 16.1  09.6 - 04.5 %   MCV 88.2  78.0 - 100.0 fL   MCH 31.0  26.0 - 34.0 pg   MCHC 35.1  30.0 - 36.0 g/dL   RDW 40.9  81.1 - 91.4 %   Platelets 209  150 - 400 K/uL   Neutrophils Relative 87 (*) 43 - 77 %   Neutro Abs 13.2 (*) 1.7 - 7.7 K/uL   Lymphocytes Relative 10 (*) 12 - 46 %   Lymphs Abs 1.5  0.7 - 4.0 K/uL   Monocytes Relative 3  3 - 12 %   Monocytes Absolute 0.5  0.1 - 1.0 K/uL   Eosinophils Relative 0  0 - 5 %   Eosinophils Absolute 0.0  0.0 - 0.7 K/uL   Basophils Relative 0  0 - 1 %   Basophils Absolute 0.0  0.0 - 0.1 K/uL  COMPREHENSIVE METABOLIC PANEL      Component Value Range   Sodium 139  135 - 145 mEq/L   Potassium 3.4 (*) 3.5 - 5.1 mEq/L   Chloride 101  96 - 112 mEq/L   CO2 25  19 - 32 mEq/L   Glucose, Bld 209 (*) 70 - 99 mg/dL   BUN 28 (*) 6 - 23 mg/dL   Creatinine, Ser 7.82  0.50 - 1.10 mg/dL   Calcium 95.6  8.4 - 21.3 mg/dL   Total Protein 7.3  6.0 - 8.3 g/dL   Albumin 4.2  3.5 - 5.2 g/dL   AST 22  0 - 37 U/L   ALT 22  0 - 35 U/L   Alkaline Phosphatase 103  39 - 117 U/L   Total  Bilirubin 0.5  0.3 - 1.2 mg/dL   GFR calc non Af Amer 55 (*) >90 mL/min   GFR calc Af Amer 64 (*) >90 mL/min  LIPASE, BLOOD      Component Value Range   Lipase 25  11 - 59 U/L   Ct Abdomen Pelvis W Contrast  09/09/2012  *RADIOLOGY REPORT*  Clinical Data: Nausea and vomiting. Right upper quadrant abdominal pain.  CT ABDOMEN AND PELVIS WITH CONTRAST  Technique:  Multidetector CT imaging of the abdomen and pelvis was performed following the standard protocol during bolus administration of intravenous contrast.  Contrast:  100 ml of Omnipaque-300.  Comparison: CT of the abdomen and pelvis 08/13/2010.  Findings:  Lung Bases: Small hiatal hernia.  Otherwise, unremarkable.  Abdomen/Pelvis:  There are some ill-defined foci of increased attenuation within the gallbladder, favored to represent noncalcified gallstones.  Gallbladder is moderately distended. However, there does not appear to be significant pericholecystic fluid or stranding to suggest acute cholecystitis at this time. Numerous tiny calcifications scattered throughout the liver and spleen are compatible with calcified granulomas.  The appearance of the pancreas, bilateral adrenal glands and bilateral kidneys is unremarkable.  Normal appendix.  No ascites or pneumoperitoneum and no pathologic  distension of small bowel.  No definite pathologic lymphadenopathy identified within the abdomen or pelvis.  There are numerous colonic diverticula, without surrounding inflammatory changes to suggest acute diverticulitis at this time.  Uterus and ovaries are unremarkable in appearance.  Small amount of gas in the nondependent portion of the urinary bladder is presumably iatrogenic.  Musculoskeletal: There are no aggressive appearing lytic or blastic lesions noted in the visualized portions of the skeleton.  IMPRESSION: 1.  Cholelithiasis without evidence to suggest acute cholecystitis at this time. 2.  Colonic diverticulosis without findings to suggest acute  diverticulitis at this time. 3.  Normal appendix. 4.  Small hiatal hernia. 5.  Small amount of gas in the nondependent portion of the urinary bladder.  This is presumably iatrogenic, however, in the absence of recent bladder catheterization, clinical correlation for signs and symptoms of urinary tract infection may be warranted, as this may alternatively reflect infection with gas forming organisms.   Original Report Authenticated By: Trudie Reed, M.D.     WBC is elevated at 15,100 with 87% neutrophils.  CT of abdomen shows distended GB with noncalcified gallstones.  2:30 PM Case discussed with Dr. Franky Macho, General Surgeon.  He requests that she be admitted by Dr. Felecia Shelling for medical clearance and he will consult.  2:47 PM Discussed with Dr. Juanetta Gosling --> admit to a med-surg bed.     1. Abdominal  pain, other specified site   2. Acute cholecystitis     I personally performed the services described in this documentation, which was scribed in my presence. The recorded information has been reviewed and considered.  Osvaldo Human, MD        Claudia Cooper III, MD 09/09/12 [phone]

## 2012-09-09 NOTE — ED Notes (Signed)
Pt c/o epigastric pain that radiates up to chest area and around to right side of abd and back area, pain is associated with n/v, started suddenly around midnight. Denies any sob, diaphoresis.

## 2012-09-09 NOTE — ED Provider Notes (Deleted)
11:12 AM  Date: 09/09/2012  Rate:65  Rhythm: normal sinus rhythm  QRS Axis: normal  Intervals: normal  ST/T Wave abnormalities: normal  Conduction Disutrbances:none  Narrative Interpretation: Normal EKG  Old EKG Reviewed: unchanged    Carleene Cooper III, MD 09/09/12 1113

## 2012-09-10 ENCOUNTER — Inpatient Hospital Stay (HOSPITAL_COMMUNITY): Payer: Medicaid Other

## 2012-09-10 LAB — GLUCOSE, CAPILLARY
Glucose-Capillary: 122 mg/dL — ABNORMAL HIGH (ref 70–99)
Glucose-Capillary: 122 mg/dL — ABNORMAL HIGH (ref 70–99)
Glucose-Capillary: 125 mg/dL — ABNORMAL HIGH (ref 70–99)
Glucose-Capillary: 142 mg/dL — ABNORMAL HIGH (ref 70–99)
Glucose-Capillary: 174 mg/dL — ABNORMAL HIGH (ref 70–99)
Glucose-Capillary: 79 mg/dL (ref 70–99)

## 2012-09-10 LAB — COMPREHENSIVE METABOLIC PANEL
ALT: 13 U/L (ref 0–35)
AST: 13 U/L (ref 0–37)
Albumin: 3.1 g/dL — ABNORMAL LOW (ref 3.5–5.2)
Alkaline Phosphatase: 77 U/L (ref 39–117)
BUN: 15 mg/dL (ref 6–23)
CO2: 24 mEq/L (ref 19–32)
Calcium: 8.6 mg/dL (ref 8.4–10.5)
Chloride: 111 mEq/L (ref 96–112)
Creatinine, Ser: 0.85 mg/dL (ref 0.50–1.10)
GFR calc Af Amer: 85 mL/min — ABNORMAL LOW (ref >90)
GFR calc non Af Amer: 73 mL/min — ABNORMAL LOW (ref >90)
Glucose, Bld: 95 mg/dL (ref 70–99)
Potassium: 3.5 mEq/L (ref 3.5–5.1)
Sodium: 143 mEq/L (ref 135–145)
Total Bilirubin: 0.7 mg/dL (ref 0.3–1.2)
Total Protein: 5.7 g/dL — ABNORMAL LOW (ref 6.0–8.3)

## 2012-09-10 LAB — CBC
HCT: 38.8 % (ref 36.0–46.0)
Hemoglobin: 13.2 g/dL (ref 12.0–15.0)
MCH: 30.7 pg (ref 26.0–34.0)
MCHC: 34 g/dL (ref 30.0–36.0)
MCV: 90.2 fL (ref 78.0–100.0)
Platelets: 188 10*3/uL (ref 150–400)
RBC: 4.3 MIL/uL (ref 3.87–5.11)
RDW: 14.7 % (ref 11.5–15.5)
WBC: 8.3 10*3/uL (ref 4.0–10.5)

## 2012-09-10 IMAGING — CR DG CHEST 1V PORT
1 series · 1 of 1 positions shown · non-contrast
Comparison: Chest x-ray [DATE].

CLINICAL DATA: Hypertension.  Preoperative study.

PORTABLE CHEST - 1 VIEW

[view not recorded]
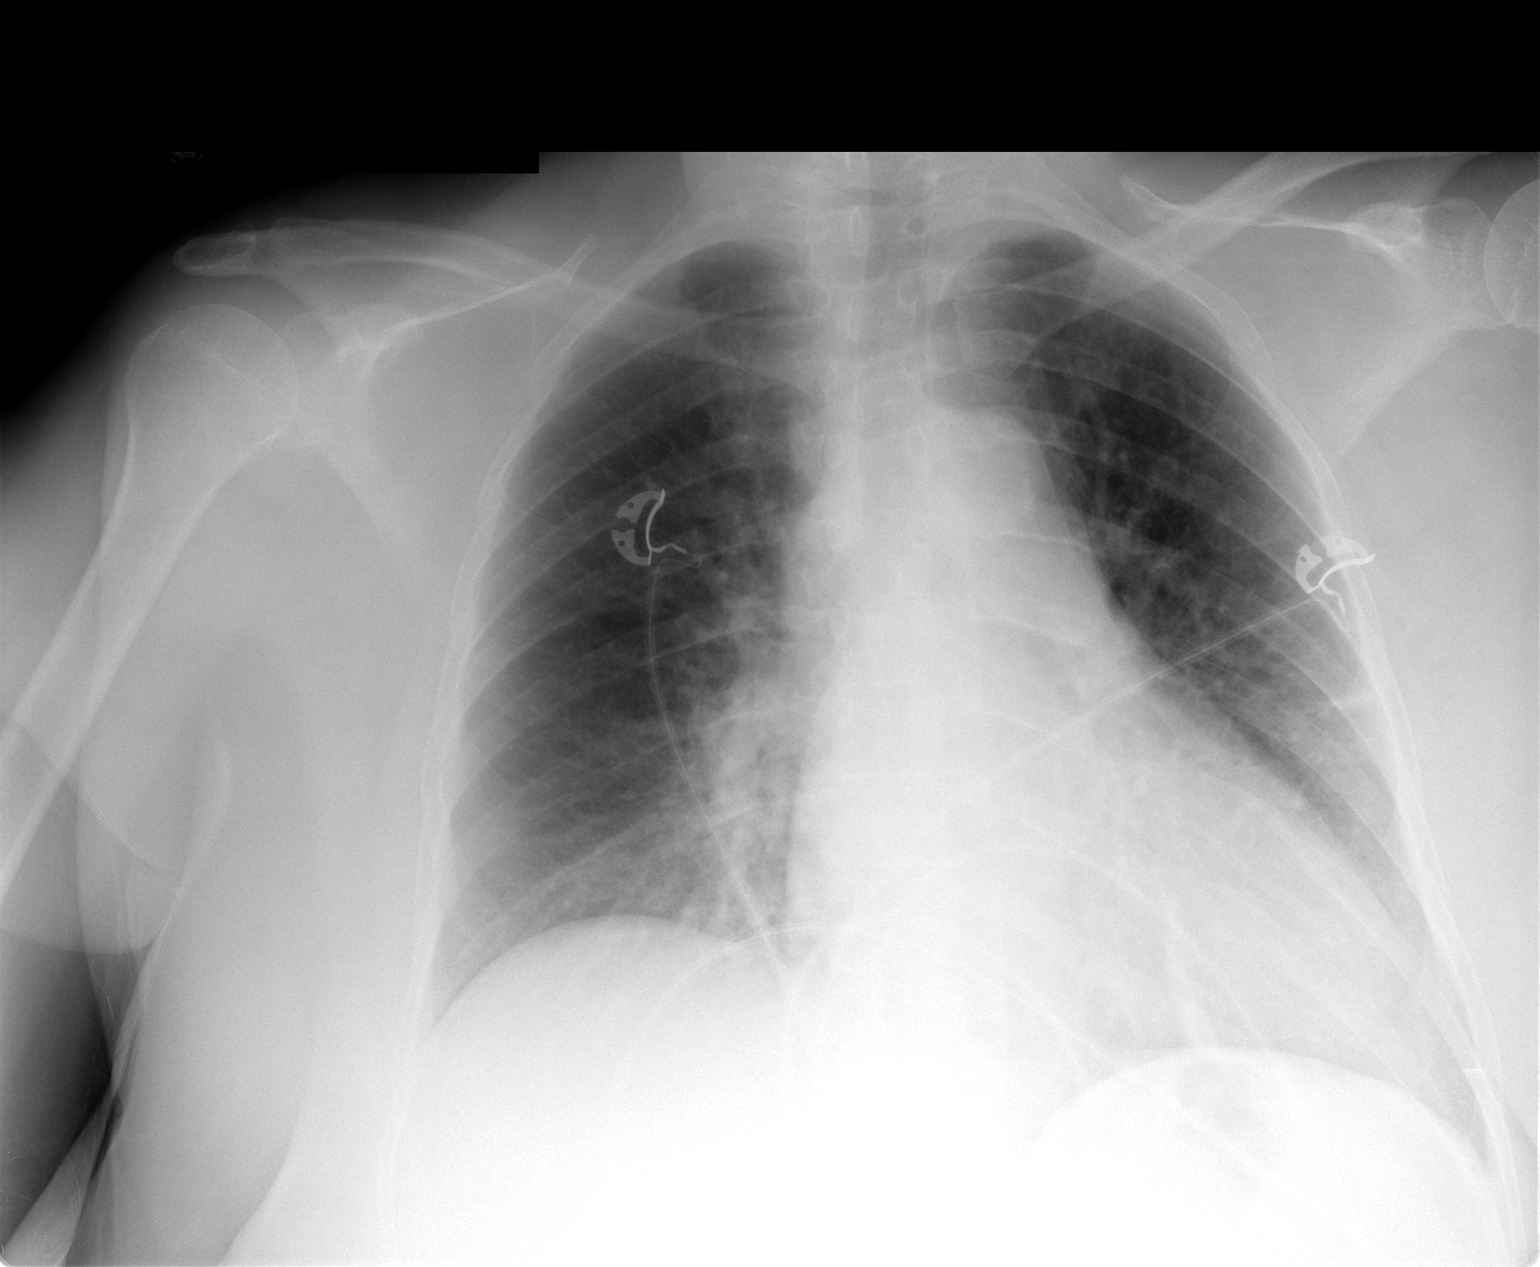

[1 of 1 positions shown; findings below may reference images not displayed]

FINDINGS: Linear opacities in the left mid lung and left lower lobe
likely represents subsegmental atelectasis.  No definite
consolidative airspace disease.  No pleural effusions.  Mild
crowding of the pulmonary vasculature, without frank pulmonary
edema.  Mild cardiomegaly (unchanged). The patient is rotated to
the left on today's exam, resulting in distortion of the
mediastinal contours and reduced diagnostic sensitivity and
specificity for mediastinal pathology.  Atherosclerosis in the
thoracic aorta.
IMPRESSION: 1.  No radiographic evidence of acute cardiopulmonary disease.
2.  Linear opacities in the left mid lung and left lower lobe most
compatible with subsegmental atelectasis.
3.  Mild cardiomegaly (unchanged).
4.  Atherosclerosis.

## 2012-09-10 MED ORDER — DEXTROSE 50 % IV SOLN
INTRAVENOUS | Status: AC
  Administered 2012-09-10: 25 mL via INTRAVENOUS
  Filled 2012-09-10: qty 50

## 2012-09-10 MED ORDER — DEXTROSE 50 % IV SOLN
25.0000 mL | Freq: Once | INTRAVENOUS | Status: AC | PRN
  Administered 2012-09-10: 25 mL via INTRAVENOUS

## 2012-09-10 MED ORDER — POTASSIUM CHLORIDE 10 MEQ/100ML IV SOLN
10.0000 meq | INTRAVENOUS | Status: AC
  Administered 2012-09-10 (×3): 10 meq via INTRAVENOUS
  Filled 2012-09-10: qty 300

## 2012-09-10 MED ORDER — LACTATED RINGERS IV SOLN
INTRAVENOUS | Status: DC
  Administered 2012-09-10 – 2012-09-11 (×4): via INTRAVENOUS

## 2012-09-10 MED ORDER — SODIUM CHLORIDE 0.9 % IJ SOLN
INTRAMUSCULAR | Status: AC
  Administered 2012-09-10: 10 mL
  Filled 2012-09-10: qty 3

## 2012-09-10 MED ORDER — LEVOTHYROXINE SODIUM 75 MCG PO TABS
ORAL_TABLET | ORAL | Status: AC
  Administered 2012-09-10: 150 ug via ORAL
  Filled 2012-09-10: qty 2

## 2012-09-10 MED ORDER — CHLORHEXIDINE GLUCONATE 4 % EX LIQD
1.0000 "application " | Freq: Once | CUTANEOUS | Status: AC
  Administered 2012-09-10: 1 via TOPICAL
  Filled 2012-09-10: qty 15

## 2012-09-10 NOTE — Consult Note (Signed)
Reason for Consult: Cholecystitis, cholelithiasis Referring Physician: Dr. Colette Ribas Claudia Keller is an 61 y.o. female.  HPI: Patient is a 61 year old obese white female multiple medical problems who presents with right corner abdominal pain, nausea, vomiting. CT scan of the abdomen revealed cholelithiasis. She was admitted to the hospital for control of her nausea and vomiting. She was also noted to have a urinary tract infection at the time of admission. She did have an episode of hypotension, thus she was admitted to the intensive care unit for further monitoring.  Past Medical History  Diagnosis Date  . Essential hypertension, benign   . Type 2 diabetes mellitus   . Hypothyroidism   . Mixed hyperlipidemia   . Bipolar disorder   . Hx of colonic polyps   . Stress incontinence, female   . S/P colonoscopy Jan 2011    RMR: pancolonic diverticula, polypoid rectal mucosa with  prominent lymphoid aggregates, repeat in Jan 2016   . Irritable bowel syndrome     Past Surgical History  Procedure Date  . Bilateral tubal ligation   . Colonoscopy 11/17/2009    normal rectum/  . Esophagogastroduodenoscopy 11/23/2011    Procedure: ESOPHAGOGASTRODUODENOSCOPY (EGD);  Surgeon: Corbin Ade, MD;  Location: [facility];  Service: Endoscopy;  Laterality: N/A;  9:00  . Maloney dilation 11/23/2011    Procedure: Elease Hashimoto DILATION;  Surgeon: Corbin Ade, MD;  Location: [facility];  Service: Endoscopy;  Laterality: N/A;    Family History  Problem Relation Age of Onset  . Coronary artery disease    . Diabetes type II    . Schizophrenia Brother   . Stroke Brother   . Cancer Mother     unsure what type  . Heart disease Father   . Heart attack Father   . Colon cancer Neg Hx     Social History:  reports that she has been smoking Cigarettes.  She has a 62 pack-year smoking history. She has never used smokeless tobacco. She reports that she does not drink alcohol or use illicit drugs.  Allergies:  No Known Allergies  Medications: I have reviewed the patient's current medications.  Results for orders placed during the hospital encounter of 09/09/12 (from the past 48 hour(s))  CBC WITH DIFFERENTIAL     Status: Abnormal   Collection Time   09/09/12 12:06 PM      Component Value Range Comment   WBC 15.1 (*) 4.0 - 10.5 K/uL    RBC 4.91  3.87 - 5.11 MIL/uL    Hemoglobin 15.2 (*) 12.0 - 15.0 g/dL    HCT 16.1  09.6 - 04.5 %    MCV 88.2  78.0 - 100.0 fL    MCH 31.0  26.0 - 34.0 pg    MCHC 35.1  30.0 - 36.0 g/dL    RDW 40.9  81.1 - 91.4 %    Platelets 209  150 - 400 K/uL    Neutrophils Relative 87 (*) 43 - 77 %    Neutro Abs 13.2 (*) 1.7 - 7.7 K/uL    Lymphocytes Relative 10 (*) 12 - 46 %    Lymphs Abs 1.5  0.7 - 4.0 K/uL    Monocytes Relative 3  3 - 12 %    Monocytes Absolute 0.5  0.1 - 1.0 K/uL    Eosinophils Relative 0  0 - 5 %    Eosinophils Absolute 0.0  0.0 - 0.7 K/uL    Basophils Relative 0  0 -  1 %    Basophils Absolute 0.0  0.0 - 0.1 K/uL   COMPREHENSIVE METABOLIC PANEL     Status: Abnormal   Collection Time   09/09/12 12:06 PM      Component Value Range Comment   Sodium 139  135 - 145 mEq/L    Potassium 3.4 (*) 3.5 - 5.1 mEq/L    Chloride 101  96 - 112 mEq/L    CO2 25  19 - 32 mEq/L    Glucose, Bld 209 (*) 70 - 99 mg/dL    BUN 28 (*) 6 - 23 mg/dL    Creatinine, Ser 1.19  0.50 - 1.10 mg/dL    Calcium 14.7  8.4 - 10.5 mg/dL    Total Protein 7.3  6.0 - 8.3 g/dL    Albumin 4.2  3.5 - 5.2 g/dL    AST 22  0 - 37 U/L    ALT 22  0 - 35 U/L    Alkaline Phosphatase 103  39 - 117 U/L    Total Bilirubin 0.5  0.3 - 1.2 mg/dL    GFR calc non Af Amer 55 (*) >90 mL/min    GFR calc Af Amer 64 (*) >90 mL/min   LIPASE, BLOOD     Status: Normal   Collection Time   09/09/12 12:06 PM      Component Value Range Comment   Lipase 25  11 - 59 U/L   URINALYSIS, ROUTINE W REFLEX MICROSCOPIC     Status: Abnormal   Collection Time   09/09/12  2:10 PM      Component Value Range Comment    Color, Urine YELLOW  YELLOW    APPearance CLEAR  CLEAR    Specific Gravity, Urine 1.015  1.005 - 1.030    pH 7.0  5.0 - 8.0    Glucose, UA 100 (*) NEGATIVE mg/dL    Hgb urine dipstick NEGATIVE  NEGATIVE    Bilirubin Urine NEGATIVE  NEGATIVE    Ketones, ur 15 (*) NEGATIVE mg/dL    Protein, ur NEGATIVE  NEGATIVE mg/dL    Urobilinogen, UA 0.2  0.0 - 1.0 mg/dL    Nitrite POSITIVE (*) NEGATIVE    Leukocytes, UA NEGATIVE  NEGATIVE   URINE MICROSCOPIC-ADD ON     Status: Abnormal   Collection Time   09/09/12  2:10 PM      Component Value Range Comment   Squamous Epithelial / LPF RARE  RARE    WBC, UA 11-20  <3 WBC/hpf    Bacteria, UA MANY (*) RARE   GLUCOSE, CAPILLARY     Status: Abnormal   Collection Time   09/09/12  4:25 PM      Component Value Range Comment   Glucose-Capillary 153 (*) 70 - 99 mg/dL   MRSA PCR SCREENING     Status: Normal   Collection Time   09/09/12  5:40 PM      Component Value Range Comment   MRSA by PCR NEGATIVE  NEGATIVE   GLUCOSE, CAPILLARY     Status: Normal   Collection Time   09/09/12  8:01 PM      Component Value Range Comment   Glucose-Capillary 75  70 - 99 mg/dL    Comment 1 Notify RN      Comment 2 Documented in Chart     GLUCOSE, CAPILLARY     Status: Normal   Collection Time   09/09/12 11:27 PM      Component Value Range Comment  Glucose-Capillary 76  70 - 99 mg/dL    Comment 1 Notify RN      Comment 2 Documented in Chart     GLUCOSE, CAPILLARY     Status: Abnormal   Collection Time   09/10/12  3:42 AM      Component Value Range Comment   Glucose-Capillary 122 (*) 70 - 99 mg/dL    Comment 1 Documented in Chart      Comment 2 Notify RN     COMPREHENSIVE METABOLIC PANEL     Status: Abnormal   Collection Time   09/10/12  4:47 AM      Component Value Range Comment   Sodium 143  135 - 145 mEq/L    Potassium 3.5  3.5 - 5.1 mEq/L    Chloride 111  96 - 112 mEq/L    CO2 24  19 - 32 mEq/L    Glucose, Bld 95  70 - 99 mg/dL    BUN 15  6 - 23 mg/dL      Creatinine, Ser 1.61  0.50 - 1.10 mg/dL    Calcium 8.6  8.4 - 09.6 mg/dL    Total Protein 5.7 (*) 6.0 - 8.3 g/dL    Albumin 3.1 (*) 3.5 - 5.2 g/dL    AST 13  0 - 37 U/L    ALT 13  0 - 35 U/L    Alkaline Phosphatase 77  39 - 117 U/L    Total Bilirubin 0.7  0.3 - 1.2 mg/dL    GFR calc non Af Amer 73 (*) >90 mL/min    GFR calc Af Amer 85 (*) >90 mL/min   CBC     Status: Normal   Collection Time   09/10/12  4:47 AM      Component Value Range Comment   WBC 8.3  4.0 - 10.5 K/uL    RBC 4.30  3.87 - 5.11 MIL/uL    Hemoglobin 13.2  12.0 - 15.0 g/dL    HCT 04.5  40.9 - 81.1 %    MCV 90.2  78.0 - 100.0 fL    MCH 30.7  26.0 - 34.0 pg    MCHC 34.0  30.0 - 36.0 g/dL    RDW 91.4  78.2 - 95.6 %    Platelets 188  150 - 400 K/uL   GLUCOSE, CAPILLARY     Status: Normal   Collection Time   09/10/12  7:36 AM      Component Value Range Comment   Glucose-Capillary 79  70 - 99 mg/dL     Ct Abdomen Pelvis W Contrast  09/09/2012  *RADIOLOGY REPORT*  Clinical Data: Nausea and vomiting. Right upper quadrant abdominal pain.  CT ABDOMEN AND PELVIS WITH CONTRAST  Technique:  Multidetector CT imaging of the abdomen and pelvis was performed following the standard protocol during bolus administration of intravenous contrast.  Contrast:  100 ml of Omnipaque-300.  Comparison: CT of the abdomen and pelvis 08/13/2010.  Findings:  Lung Bases: Small hiatal hernia.  Otherwise, unremarkable.  Abdomen/Pelvis:  There are some ill-defined foci of increased attenuation within the gallbladder, favored to represent noncalcified gallstones.  Gallbladder is moderately distended. However, there does not appear to be significant pericholecystic fluid or stranding to suggest acute cholecystitis at this time. Numerous tiny calcifications scattered throughout the liver and spleen are compatible with calcified granulomas.  The appearance of the pancreas, bilateral adrenal glands and bilateral kidneys is unremarkable.  Normal appendix.  No  ascites or pneumoperitoneum and  no pathologic distension of small bowel.  No definite pathologic lymphadenopathy identified within the abdomen or pelvis.  There are numerous colonic diverticula, without surrounding inflammatory changes to suggest acute diverticulitis at this time.  Uterus and ovaries are unremarkable in appearance.  Small amount of gas in the nondependent portion of the urinary bladder is presumably iatrogenic.  Musculoskeletal: There are no aggressive appearing lytic or blastic lesions noted in the visualized portions of the skeleton.  IMPRESSION: 1.  Cholelithiasis without evidence to suggest acute cholecystitis at this time. 2.  Colonic diverticulosis without findings to suggest acute diverticulitis at this time. 3.  Normal appendix. 4.  Small hiatal hernia. 5.  Small amount of gas in the nondependent portion of the urinary bladder.  This is presumably iatrogenic, however, in the absence of recent bladder catheterization, clinical correlation for signs and symptoms of urinary tract infection may be warranted, as this may alternatively reflect infection with gas forming organisms.   Original Report Authenticated By: Trudie Reed, M.D.     ROS: See chart Blood pressure 71/54, pulse 70, temperature 99.1 F (37.3 C), temperature source Oral, resp. rate 19, height 5\' 7"  (1.702 m), weight 98.6 kg (217 lb 6 oz), SpO2 92.00%. Physical Exam: Pleasant white female no acute distress. Abdomen: Soft, minimal tenderness in the right upper quadrant to palpation. No hepatosplenomegaly, masses, or hernias identified. No rigidity is identified.  Assessment/Plan: Impression: Cholecystitis, cholelithiasis, UTI Plan: The patient's leukocytosis has resolved. Her right upper quadrant pain has somewhat subsided. Given that she has cholelithiasis, we'll proceed with a laparoscopic cholecystectomy tomorrow if her blood pressure remained stable. The risks and benefits of the procedure including bleeding,  infection, hepatobiliary injury, and the possibility of an open procedure were fully explained to the patient, who gave informed consent.  Ashdon Gillson A 09/10/2012, 9:46 AM

## 2012-09-10 NOTE — Progress Notes (Signed)
Subjective: Patient was admitted due to cholecystis with cholelithiasis.he is seen by Dr. Lovell Sheehan and scheduled for surgery.  Objective: Vital signs in last 24 hours: Temp:  [97.7 F (36.5 C)-99.1 F (37.3 C)] 97.7 F (36.5 C) (11/03 0800) Pulse Rate:  [66-82] 69  (11/03 1000) Resp:  [14-22] 21  (11/03 1000) BP: (71-152)/(47-69) 127/65 mmHg (11/03 1000) SpO2:  [88 %-98 %] 95 % (11/03 1000) Weight:  [95.9 kg (211 lb 6.7 oz)-98.6 kg (217 lb 6 oz)] 98.6 kg (217 lb 6 oz) (11/03 0345) Weight change:  Last BM Date: 09/08/12  Intake/Output from previous day: 11/02 0701 - 11/03 0700 In: 2430 [P.O.:120; I.V.:2250; IV Piggyback:60] Out: -   PHYSICAL EXAM General appearance: alert and no distress Resp: clear to auscultation bilaterally Cardio: S1, S2 normal GI: soft and lax, bowel sound is positive, no tenderness Extremities: extremities normal, atraumatic, no cyanosis or edema  Lab Results:    @labtest @ [facility] No results found for this basename: PHART,PCO2,PO2ART,TCO2,HCO3 in the last 72 hours CULTURES Recent Results (from the past 240 hour(s))  MRSA PCR SCREENING     Status: Normal   Collection Time   09/09/12  5:40 PM      Component Value Range Status Comment   MRSA by PCR NEGATIVE  NEGATIVE Final    Studies/Results: Ct Abdomen Pelvis W Contrast  09/09/2012  *RADIOLOGY REPORT*  Clinical Data: Nausea and vomiting. Right upper quadrant abdominal pain.  CT ABDOMEN AND PELVIS WITH CONTRAST  Technique:  Multidetector CT imaging of the abdomen and pelvis was performed following the standard protocol during bolus administration of intravenous contrast.  Contrast:  100 ml of Omnipaque-300.  Comparison: CT of the abdomen and pelvis 08/13/2010.  Findings:  Lung Bases: Small hiatal hernia.  Otherwise, unremarkable.  Abdomen/Pelvis:  There are some ill-defined foci of increased attenuation within the gallbladder, favored to represent noncalcified gallstones.  Gallbladder is moderately  distended. However, there does not appear to be significant pericholecystic fluid or stranding to suggest acute cholecystitis at this time. Numerous tiny calcifications scattered throughout the liver and spleen are compatible with calcified granulomas.  The appearance of the pancreas, bilateral adrenal glands and bilateral kidneys is unremarkable.  Normal appendix.  No ascites or pneumoperitoneum and no pathologic distension of small bowel.  No definite pathologic lymphadenopathy identified within the abdomen or pelvis.  There are numerous colonic diverticula, without surrounding inflammatory changes to suggest acute diverticulitis at this time.  Uterus and ovaries are unremarkable in appearance.  Small amount of gas in the nondependent portion of the urinary bladder is presumably iatrogenic.  Musculoskeletal: There are no aggressive appearing lytic or blastic lesions noted in the visualized portions of the skeleton.  IMPRESSION: 1.  Cholelithiasis without evidence to suggest acute cholecystitis at this time. 2.  Colonic diverticulosis without findings to suggest acute diverticulitis at this time. 3.  Normal appendix. 4.  Small hiatal hernia. 5.  Small amount of gas in the nondependent portion of the urinary bladder.  This is presumably iatrogenic, however, in the absence of recent bladder catheterization, clinical correlation for signs and symptoms of urinary tract infection may be warranted, as this may alternatively reflect infection with gas forming organisms.   Original Report Authenticated By: Trudie Reed, M.D.     Medications: I have reviewed the patient's current medications.  Assesment: 1. Cholecystitis with cholelithiasis 2,UTI 3. DM type II 4. hypothyroidism 5. Morbid obesity  Active Problems:  * No active hospital problems. *     Plan: -  continue iv antibiotics -surgical consult appreciated - as surgery plan.    LOS: 1 day   Bryli Mantey 09/10/2012, 12:50 PM

## 2012-09-11 ENCOUNTER — Encounter (HOSPITAL_COMMUNITY): Payer: Self-pay | Admitting: Anesthesiology

## 2012-09-11 ENCOUNTER — Encounter (HOSPITAL_COMMUNITY)
Admission: EM | Disposition: A | Discharge status: Home / Self Care | Payer: Self-pay | Source: Home / Self Care | Attending: Internal Medicine

## 2012-09-11 ENCOUNTER — Encounter (HOSPITAL_COMMUNITY): Payer: Self-pay | Admitting: *Deleted

## 2012-09-11 ENCOUNTER — Inpatient Hospital Stay (HOSPITAL_COMMUNITY): Payer: Medicaid Other | Admitting: Anesthesiology

## 2012-09-11 HISTORY — PX: CHOLECYSTECTOMY: SHX55

## 2012-09-11 LAB — BASIC METABOLIC PANEL
BUN: 11 mg/dL (ref 6–23)
CO2: 24 mEq/L (ref 19–32)
Calcium: 9.1 mg/dL (ref 8.4–10.5)
Chloride: 109 mEq/L (ref 96–112)
Creatinine, Ser: 0.92 mg/dL (ref 0.50–1.10)
GFR calc Af Amer: 77 mL/min — ABNORMAL LOW (ref >90)
GFR calc non Af Amer: 66 mL/min — ABNORMAL LOW (ref >90)
Glucose, Bld: 121 mg/dL — ABNORMAL HIGH (ref 70–99)
Potassium: 4 mEq/L (ref 3.5–5.1)
Sodium: 140 mEq/L (ref 135–145)

## 2012-09-11 LAB — CBC
HCT: 37.5 % (ref 36.0–46.0)
Hemoglobin: 12.7 g/dL (ref 12.0–15.0)
MCH: 30.6 pg (ref 26.0–34.0)
MCHC: 33.9 g/dL (ref 30.0–36.0)
MCV: 90.4 fL (ref 78.0–100.0)
Platelets: 170 10*3/uL (ref 150–400)
RBC: 4.15 MIL/uL (ref 3.87–5.11)
RDW: 14.3 % (ref 11.5–15.5)
WBC: 7.6 10*3/uL (ref 4.0–10.5)

## 2012-09-11 LAB — GLUCOSE, CAPILLARY
Glucose-Capillary: 67 mg/dL — ABNORMAL LOW (ref 70–99)
Glucose-Capillary: 116 mg/dL — ABNORMAL HIGH (ref 70–99)
Glucose-Capillary: 126 mg/dL — ABNORMAL HIGH (ref 70–99)
Glucose-Capillary: 118 mg/dL — ABNORMAL HIGH (ref 70–99)
Glucose-Capillary: 151 mg/dL — ABNORMAL HIGH (ref 70–99)
Glucose-Capillary: 143 mg/dL — ABNORMAL HIGH (ref 70–99)

## 2012-09-11 SURGERY — LAPAROSCOPIC CHOLECYSTECTOMY
Anesthesia: General | Site: Abdomen | Wound class: Contaminated

## 2012-09-11 MED ORDER — BUPIVACAINE HCL (PF) 0.5 % IJ SOLN
INTRAMUSCULAR | Status: AC
  Filled 2012-09-11: qty 30

## 2012-09-11 MED ORDER — INSULIN ASPART 100 UNIT/ML ~~LOC~~ SOLN
0.0000 [IU] | Freq: Three times a day (TID) | SUBCUTANEOUS | Status: DC
  Administered 2012-09-12: 3 [IU] via SUBCUTANEOUS

## 2012-09-11 MED ORDER — MIDAZOLAM HCL 2 MG/2ML IJ SOLN
1.0000 mg | INTRAMUSCULAR | Status: DC | PRN
  Administered 2012-09-11: 2 mg via INTRAVENOUS

## 2012-09-11 MED ORDER — SODIUM CHLORIDE 0.9 % IR SOLN
Status: DC | PRN
  Administered 2012-09-11: 1000 mL

## 2012-09-11 MED ORDER — ALBUTEROL SULFATE (5 MG/ML) 0.5% IN NEBU
INHALATION_SOLUTION | RESPIRATORY_TRACT | Status: AC
  Filled 2012-09-11: qty 0.5

## 2012-09-11 MED ORDER — SODIUM CHLORIDE 0.9 % IN NEBU
INHALATION_SOLUTION | RESPIRATORY_TRACT | Status: AC
  Filled 2012-09-11: qty 3

## 2012-09-11 MED ORDER — ALBUTEROL SULFATE (5 MG/ML) 0.5% IN NEBU
2.5000 mg | INHALATION_SOLUTION | Freq: Once | RESPIRATORY_TRACT | Status: AC
  Administered 2012-09-11: 2.5 mg via RESPIRATORY_TRACT

## 2012-09-11 MED ORDER — GLYCOPYRROLATE 0.2 MG/ML IJ SOLN
INTRAMUSCULAR | Status: DC | PRN
  Administered 2012-09-11: 0.4 mg via INTRAVENOUS

## 2012-09-11 MED ORDER — SUCCINYLCHOLINE CHLORIDE 20 MG/ML IJ SOLN
INTRAMUSCULAR | Status: AC
  Filled 2012-09-11: qty 1

## 2012-09-11 MED ORDER — LACTATED RINGERS IV SOLN: INTRAVENOUS | Status: DC

## 2012-09-11 MED ORDER — HEMOSTATIC AGENTS (NO CHARGE) OPTIME
TOPICAL | Status: DC | PRN
  Administered 2012-09-11: 1 via TOPICAL

## 2012-09-11 MED ORDER — MIDAZOLAM HCL 2 MG/2ML IJ SOLN
INTRAMUSCULAR | Status: AC
  Filled 2012-09-11: qty 2

## 2012-09-11 MED ORDER — ROCURONIUM BROMIDE 100 MG/10ML IV SOLN
INTRAVENOUS | Status: DC | PRN
  Administered 2012-09-11: 30 mg via INTRAVENOUS

## 2012-09-11 MED ORDER — ROCURONIUM BROMIDE 50 MG/5ML IV SOLN
INTRAVENOUS | Status: AC
  Filled 2012-09-11: qty 1

## 2012-09-11 MED ORDER — ACETAMINOPHEN 10 MG/ML IV SOLN
INTRAVENOUS | Status: AC
  Filled 2012-09-11: qty 100

## 2012-09-11 MED ORDER — MORPHINE SULFATE 2 MG/ML IJ SOLN
1.0000 mg | INTRAMUSCULAR | Status: DC | PRN
  Administered 2012-09-11: 1 mg via INTRAVENOUS
  Filled 2012-09-11: qty 1

## 2012-09-11 MED ORDER — ACETAMINOPHEN 10 MG/ML IV SOLN
1000.0000 mg | Freq: Four times a day (QID) | INTRAVENOUS | Status: AC
  Administered 2012-09-11 – 2012-09-12 (×4): 1000 mg via INTRAVENOUS
  Filled 2012-09-11 (×3): qty 100

## 2012-09-11 MED ORDER — PROPOFOL 10 MG/ML IV BOLUS
INTRAVENOUS | Status: DC | PRN
  Administered 2012-09-11: 160 mg via INTRAVENOUS

## 2012-09-11 MED ORDER — ONDANSETRON HCL 4 MG/2ML IJ SOLN: 4.0000 mg | Freq: Once | INTRAMUSCULAR | Status: DC

## 2012-09-11 MED ORDER — FENTANYL CITRATE 0.05 MG/ML IJ SOLN
INTRAMUSCULAR | Status: DC | PRN
  Administered 2012-09-11: 100 ug via INTRAVENOUS
  Administered 2012-09-11 (×2): 50 ug via INTRAVENOUS

## 2012-09-11 MED ORDER — ONDANSETRON HCL 4 MG/2ML IJ SOLN
4.0000 mg | Freq: Once | INTRAMUSCULAR | Status: AC | PRN
  Administered 2012-09-11: 4 mg via INTRAVENOUS

## 2012-09-11 MED ORDER — GLYCOPYRROLATE 0.2 MG/ML IJ SOLN
INTRAMUSCULAR | Status: AC
  Filled 2012-09-11: qty 2

## 2012-09-11 MED ORDER — MORPHINE SULFATE 4 MG/ML IJ SOLN: 1.0000 mg | INTRAMUSCULAR | Status: DC | PRN

## 2012-09-11 MED ORDER — LIDOCAINE HCL (PF) 1 % IJ SOLN
INTRAMUSCULAR | Status: AC
  Filled 2012-09-11: qty 5

## 2012-09-11 MED ORDER — LIDOCAINE HCL 1 % IJ SOLN
INTRAMUSCULAR | Status: DC | PRN
  Administered 2012-09-11: 50 mg via INTRADERMAL

## 2012-09-11 MED ORDER — MIDAZOLAM HCL 5 MG/5ML IJ SOLN
INTRAMUSCULAR | Status: DC | PRN
  Administered 2012-09-11: 2 mg via INTRAVENOUS

## 2012-09-11 MED ORDER — FENTANYL CITRATE 0.05 MG/ML IJ SOLN
INTRAMUSCULAR | Status: AC
  Filled 2012-09-11: qty 2

## 2012-09-11 MED ORDER — FENTANYL CITRATE 0.05 MG/ML IJ SOLN
25.0000 ug | INTRAMUSCULAR | Status: DC | PRN
  Administered 2012-09-11 (×2): 25 ug via INTRAVENOUS

## 2012-09-11 MED ORDER — ONDANSETRON HCL 4 MG/2ML IJ SOLN
INTRAMUSCULAR | Status: AC
  Filled 2012-09-11: qty 2

## 2012-09-11 MED ORDER — FENTANYL CITRATE 0.05 MG/ML IJ SOLN
INTRAMUSCULAR | Status: AC
  Filled 2012-09-11: qty 5

## 2012-09-11 MED ORDER — NEOSTIGMINE METHYLSULFATE 1 MG/ML IJ SOLN
INTRAMUSCULAR | Status: DC | PRN
  Administered 2012-09-11: 3 mg via INTRAVENOUS

## 2012-09-11 MED ORDER — PROPOFOL 10 MG/ML IV EMUL
INTRAVENOUS | Status: AC
  Filled 2012-09-11: qty 20

## 2012-09-11 MED ORDER — SUCCINYLCHOLINE CHLORIDE 20 MG/ML IJ SOLN
INTRAMUSCULAR | Status: DC | PRN
  Administered 2012-09-11: 140 mg via INTRAVENOUS

## 2012-09-11 MED ORDER — LACTATED RINGERS IV SOLN
INTRAVENOUS | Status: DC
  Administered 2012-09-11: 15:00:00 via INTRAVENOUS

## 2012-09-11 SURGICAL SUPPLY — 34 items
APPLIER CLIP LAPSCP 10X32 DD (CLIP) ×2 IMPLANT
BAG HAMPER (MISCELLANEOUS) ×2 IMPLANT
BAG SPEC RTRVL LRG 6X4 10 (ENDOMECHANICALS) ×1
CLOTH BEACON ORANGE TIMEOUT ST (SAFETY) ×2 IMPLANT
COVER LIGHT HANDLE STERIS (MISCELLANEOUS) ×4 IMPLANT
DECANTER SPIKE VIAL GLASS SM (MISCELLANEOUS) ×2 IMPLANT
DURAPREP 26ML APPLICATOR (WOUND CARE) ×2 IMPLANT
ELECT REM PT RETURN 9FT ADLT (ELECTROSURGICAL) ×2
ELECTRODE REM PT RTRN 9FT ADLT (ELECTROSURGICAL) ×1 IMPLANT
FILTER SMOKE EVAC LAPAROSHD (FILTER) ×2 IMPLANT
FORMALIN 10 PREFIL 120ML (MISCELLANEOUS) ×2 IMPLANT
GLOVE BIO SURGEON STRL SZ7.5 (GLOVE) ×2 IMPLANT
GLOVE BIOGEL PI IND STRL 7.0 (GLOVE) ×2 IMPLANT
GLOVE BIOGEL PI INDICATOR 7.0 (GLOVE) ×2
GLOVE ECLIPSE 6.5 STRL STRAW (GLOVE) ×2 IMPLANT
GLOVE SS BIOGEL STRL SZ 6.5 (GLOVE) ×1 IMPLANT
GLOVE SUPERSENSE BIOGEL SZ 6.5 (GLOVE) ×1
GOWN STRL REIN XL XLG (GOWN DISPOSABLE) ×6 IMPLANT
HEMOSTAT SNOW SURGICEL 2X4 (HEMOSTASIS) ×2 IMPLANT
INST SET LAPROSCOPIC AP (KITS) ×2 IMPLANT
KIT ROOM TURNOVER APOR (KITS) ×2 IMPLANT
KIT TROCAR LAP CHOLE (TROCAR) ×2 IMPLANT
MANIFOLD NEPTUNE II (INSTRUMENTS) ×2 IMPLANT
NS IRRIG 1000ML POUR BTL (IV SOLUTION) ×2 IMPLANT
PACK LAP CHOLE LZT030E (CUSTOM PROCEDURE TRAY) ×2 IMPLANT
PAD ARMBOARD 7.5X6 YLW CONV (MISCELLANEOUS) ×2 IMPLANT
POUCH SPECIMEN RETRIEVAL 10MM (ENDOMECHANICALS) ×2 IMPLANT
SET BASIN LINEN APH (SET/KITS/TRAYS/PACK) ×2 IMPLANT
SPONGE GAUZE 2X2 8PLY STRL LF (GAUZE/BANDAGES/DRESSINGS) ×6 IMPLANT
STAPLER VISISTAT (STAPLE) ×2 IMPLANT
SUT VICRYL 0 UR6 27IN ABS (SUTURE) ×4 IMPLANT
TAPE CLOTH SURG 4X10 WHT LF (GAUZE/BANDAGES/DRESSINGS) ×2 IMPLANT
WARMER LAPAROSCOPE (MISCELLANEOUS) ×2 IMPLANT
YANKAUER SUCT 12FT TUBE ARGYLE (SUCTIONS) ×2 IMPLANT

## 2012-09-11 NOTE — Progress Notes (Signed)
Subjective: Patient is resting . Her pain is controlled. She is schedualed for surgery.  Objective: Vital signs in last 24 hours: Temp:  [97.4 F (36.3 C)-98.7 F (37.1 C)] 98.5 F (36.9 C) (11/04 0400) Pulse Rate:  [61-76] 69  (11/04 0600) Resp:  [15-24] 17  (11/04 0600) BP: (71-142)/(35-113) 114/35 mmHg (11/04 0600) SpO2:  [90 %-97 %] 94 % (11/04 0600) Weight:  [101.1 kg (222 lb 14.2 oz)] 101.1 kg (222 lb 14.2 oz) (11/04 0500) Weight change: 5.845 kg (12 lb 14.2 oz) Last BM Date: 09/08/12  Intake/Output from previous day: 11/03 0701 - 11/04 0700 In: 5411.7 [P.O.:2100; I.V.:2961.7; IV Piggyback:350] Out: 600 [Urine:600]  PHYSICAL EXAM General appearance: alert and no distress Resp: clear to auscultation bilaterally Cardio: S1, S2 normal GI: soft and lax, bowel sound is positive, no tenderness Extremities: extremities normal, atraumatic, no cyanosis or edema  Lab Results:    @labtest @ [facility] No results found for this basename: PHART,PCO2,PO2ART,TCO2,HCO3 in the last 72 hours CULTURES Recent Results (from the past 240 hour(s))  URINE CULTURE     Status: Normal (Preliminary result)   Collection Time   09/09/12  2:11 PM      Component Value Range Status Comment   Specimen Description URINE, CATHETERIZED   Final    Special Requests NONE   Final    Culture  Setup Time 09/09/2012 20:15   Final    Colony Count >=100,000 COLONIES/ML   Final    Culture GRAM NEGATIVE RODS   Final    Report Status PENDING   Incomplete   MRSA PCR SCREENING     Status: Normal   Collection Time   09/09/12  5:40 PM      Component Value Range Status Comment   MRSA by PCR NEGATIVE  NEGATIVE Final    Studies/Results: Ct Abdomen Pelvis W Contrast  09/09/2012  *RADIOLOGY REPORT*  Clinical Data: Nausea and vomiting. Right upper quadrant abdominal pain.  CT ABDOMEN AND PELVIS WITH CONTRAST  Technique:  Multidetector CT imaging of the abdomen and pelvis was performed following the standard protocol  during bolus administration of intravenous contrast.  Contrast:  100 ml of Omnipaque-300.  Comparison: CT of the abdomen and pelvis 08/13/2010.  Findings:  Lung Bases: Small hiatal hernia.  Otherwise, unremarkable.  Abdomen/Pelvis:  There are some ill-defined foci of increased attenuation within the gallbladder, favored to represent noncalcified gallstones.  Gallbladder is moderately distended. However, there does not appear to be significant pericholecystic fluid or stranding to suggest acute cholecystitis at this time. Numerous tiny calcifications scattered throughout the liver and spleen are compatible with calcified granulomas.  The appearance of the pancreas, bilateral adrenal glands and bilateral kidneys is unremarkable.  Normal appendix.  No ascites or pneumoperitoneum and no pathologic distension of small bowel.  No definite pathologic lymphadenopathy identified within the abdomen or pelvis.  There are numerous colonic diverticula, without surrounding inflammatory changes to suggest acute diverticulitis at this time.  Uterus and ovaries are unremarkable in appearance.  Small amount of gas in the nondependent portion of the urinary bladder is presumably iatrogenic.  Musculoskeletal: There are no aggressive appearing lytic or blastic lesions noted in the visualized portions of the skeleton.  IMPRESSION: 1.  Cholelithiasis without evidence to suggest acute cholecystitis at this time. 2.  Colonic diverticulosis without findings to suggest acute diverticulitis at this time. 3.  Normal appendix. 4.  Small hiatal hernia. 5.  Small amount of gas in the nondependent portion of the urinary bladder.  This  is presumably iatrogenic, however, in the absence of recent bladder catheterization, clinical correlation for signs and symptoms of urinary tract infection may be warranted, as this may alternatively reflect infection with gas forming organisms.   Original Report Authenticated By: Trudie Reed, M.D.    Dg Chest  Port 1 View  09/10/2012  *RADIOLOGY REPORT*  Clinical Data: Hypertension.  Preoperative study.  PORTABLE CHEST - 1 VIEW  Comparison: Chest x-ray 12/16/2009.  Findings: Linear opacities in the left mid lung and left lower lobe likely represents subsegmental atelectasis.  No definite consolidative airspace disease.  No pleural effusions.  Mild crowding of the pulmonary vasculature, without frank pulmonary edema.  Mild cardiomegaly (unchanged). The patient is rotated to the left on today's exam, resulting in distortion of the mediastinal contours and reduced diagnostic sensitivity and specificity for mediastinal pathology.  Atherosclerosis in the thoracic aorta.  IMPRESSION: 1.  No radiographic evidence of acute cardiopulmonary disease. 2.  Linear opacities in the left mid lung and left lower lobe most compatible with subsegmental atelectasis. 3.  Mild cardiomegaly (unchanged). 4.  Atherosclerosis.   Original Report Authenticated By: Trudie Reed, M.D.     Medications: I have reviewed the patient's current medications.  Assesment: 1. Cholecystitis with cholelithiasis 2,UTI 3. DM type II 4. hypothyroidism 5. Morbid obesity  Active Problems:  * No active hospital problems. *     Plan: - continue iv antibiotics -surgical consult appreciated - as surgery plan.    LOS: 2 days   Geoffry Bannister 09/11/2012, 7:48 AM

## 2012-09-11 NOTE — Op Note (Signed)
Patient:  Claudia Keller  DOB:  11-25-1950  MRN:  [id number]   Preop Diagnosis:  Cholecystitis, cholelithiasis  Postop Diagnosis:  Same  Procedure:  Laparoscopic cholecystectomy  Surgeon:  Franky Macho, M.D.  Anes:  General endotracheal  Indications:  Patient is a 61 year old white female with multiple medical problems who presents with cholecystitis secondary to cholelithiasis. The risks and benefits of the procedure including bleeding, infection, hepatobiliary injury, and the possibility of an open procedure were fully explained to the patient, who gave informed consent.  Procedure note:  The patient was placed in the supine position. After induction of general endotracheal anesthesia, the abdomen was prepped and draped using usual sterile technique with DuraPrep. Surgical site confirmation was performed.  A supraumbilical incision was made down to the fascia. A Veress needle was introduced into the abdominal cavity and confirmation of placement was done using the saline drop test. The abdomen was then insufflated to 16 mm mercury pressure. An 11 mm trocar was introduced into the abdominal cavity under direct visualization without difficulty. The patient was placed in reverse Trendelenburg position and additional 11 mm trocar was placed the epigastric region and 5 mm trochars were placed the right upper quadrant and right flank regions. The liver was inspected and noted within normal limits. The gallbladder was noted to be distended, tense, and edematous. The gallbladder was decompressed in order to facilitate exposure. The gallbladder was then retracted in a dynamic fashion in order to facilitate visualization of the triangle of Calot. The cystic duct was first identified. Its juncture to the infundibulum was fully identified. Endoclips placed proximally and distally on the cystic duct, and the cystic duct was divided. This is likewise done to the cystic artery. The gallbladder was then freed  away from the gallbladder fossa using Bovie electrocautery. The gallbladder was delivered through the epigastric trocar site using an Endo Catch bag. The gallbladder fossa was inspected no abnormal bleeding or bile leakage was noted. Surgicel is placed the gallbladder fossa. All fluid and air were then evacuated from the abdominal cavity prior to removal of the trochars.  All wounds were gave normal saline. All wounds were injected with 0.5% Sensorcaine. The epigastric fascia as well as supraumbilical fascia were reapproximated using 0 Vicryl interrupted sutures. All skin incisions were closed using staples. Betadine ointment and dry sterile dressings were applied.  All tape and needle counts were correct at the end of the procedure. Patient was extubated in the operating room and went back to recovery room awake in stable condition.  Complications:  None  EBL:  Minimal  Specimen:  Gallbladder

## 2012-09-11 NOTE — Transfer of Care (Signed)
Immediate Anesthesia Transfer of Care Note  Patient: Claudia Keller  Procedure(s) Performed: Procedure(s) (LRB) with comments: LAPAROSCOPIC CHOLECYSTECTOMY (N/A)  Patient Location: PACU  Anesthesia Type:General  Level of Consciousness: awake, alert  and patient cooperative  Airway & Oxygen Therapy: Patient Spontanous Breathing and Patient connected to face mask oxygen  Post-op Assessment: Report given to PACU RN, Post -op Vital signs reviewed and stable and Patient moving all extremities  Post vital signs: Reviewed and stable  Complications: No apparent anesthesia complications

## 2012-09-11 NOTE — Anesthesia Preprocedure Evaluation (Signed)
Anesthesia Evaluation  Patient identified by MRN, date of birth, ID band Patient awake    Reviewed: Allergy & Precautions, H&P , NPO status , Patient's Chart, lab work & pertinent test results  History of Anesthesia Complications Negative for: history of anesthetic complications  Airway Mallampati: I TM Distance: >3 FB     Dental  (+) Edentulous Upper and Edentulous Lower   Pulmonary COPD COPD inhaler, Current Smoker,  breath sounds clear to auscultation        Cardiovascular hypertension, Rhythm:Regular Rate:Normal     Neuro/Psych PSYCHIATRIC DISORDERS Anxiety Bipolar Disorder    GI/Hepatic   Endo/Other  diabetes, Well Controlled, Type 2, Insulin DependentHypothyroidism   Renal/GU      Musculoskeletal   Abdominal   Peds  Hematology   Anesthesia Other Findings   Reproductive/Obstetrics                           Anesthesia Physical Anesthesia Plan  ASA: III  Anesthesia Plan: General   Post-op Pain Management:    Induction: Intravenous, Rapid sequence and Cricoid pressure planned  Airway Management Planned: Oral ETT  Additional Equipment:   Intra-op Plan:   Post-operative Plan: Extubation in OR  Informed Consent: I have reviewed the patients History and Physical, chart, labs and discussed the procedure including the risks, benefits and alternatives for the proposed anesthesia with the patient or authorized representative who has indicated his/her understanding and acceptance.     Plan Discussed with:   Anesthesia Plan Comments:         Anesthesia Quick Evaluation

## 2012-09-11 NOTE — Anesthesia Postprocedure Evaluation (Signed)
  Anesthesia Post-op Note  Patient: Claudia Keller  Procedure(s) Performed: Procedure(s) (LRB) with comments: LAPAROSCOPIC CHOLECYSTECTOMY (N/A)  Patient Location: PACU  Anesthesia Type:General  Level of Consciousness: awake, alert , oriented and patient cooperative  Airway and Oxygen Therapy: Patient Spontanous Breathing  Post-op Pain: 2 /10, mild  Post-op Assessment: Post-op Vital signs reviewed, Patient's Cardiovascular Status Stable, Respiratory Function Stable, Patent Airway, No signs of Nausea or vomiting and Pain level controlled  Post-op Vital Signs: Reviewed and stable  Complications: No apparent anesthesia complications

## 2012-09-11 NOTE — Anesthesia Procedure Notes (Signed)
Procedure Name: Intubation Date/Time: 09/11/2012 12:29 PM Performed by: Despina Hidden Pre-anesthesia Checklist: Emergency Drugs available, Suction available, Patient identified and Patient being monitored Patient Re-evaluated:Patient Re-evaluated prior to inductionOxygen Delivery Method: Circle system utilized Preoxygenation: Pre-oxygenation with 100% oxygen Intubation Type: IV induction, Cricoid Pressure applied and Rapid sequence Ventilation: Mask ventilation without difficulty Laryngoscope Size: Mac and 3 Grade View: Grade I Tube type: Oral Tube size: 7.0 mm Number of attempts: 1 Airway Equipment and Method: Stylet Placement Confirmation: ETT inserted through vocal cords under direct vision,  breath sounds checked- equal and bilateral and positive ETCO2 Secured at: 22 cm Tube secured with: Tape Dental Injury: Teeth and Oropharynx as per pre-operative assessment

## 2012-09-11 NOTE — Progress Notes (Signed)
Pt taken to surgery at 1105. Vital signs stable at transfer.

## 2012-09-11 NOTE — Progress Notes (Signed)
UR Chart Review Completed  

## 2012-09-12 LAB — COMPREHENSIVE METABOLIC PANEL
ALT: 34 U/L (ref 0–35)
AST: 43 U/L — ABNORMAL HIGH (ref 0–37)
Albumin: 3 g/dL — ABNORMAL LOW (ref 3.5–5.2)
Alkaline Phosphatase: 78 U/L (ref 39–117)
BUN: 8 mg/dL (ref 6–23)
CO2: 23 mEq/L (ref 19–32)
Calcium: 9.1 mg/dL (ref 8.4–10.5)
Chloride: 106 mEq/L (ref 96–112)
Creatinine, Ser: 0.8 mg/dL (ref 0.50–1.10)
GFR calc Af Amer: >90 mL/min (ref >90)
GFR calc non Af Amer: 79 mL/min — ABNORMAL LOW (ref >90)
Glucose, Bld: 159 mg/dL — ABNORMAL HIGH (ref 70–99)
Potassium: 3.8 mEq/L (ref 3.5–5.1)
Sodium: 139 mEq/L (ref 135–145)
Total Bilirubin: 0.8 mg/dL (ref 0.3–1.2)
Total Protein: 5.7 g/dL — ABNORMAL LOW (ref 6.0–8.3)

## 2012-09-12 LAB — URINE CULTURE: Colony Count: >=100000

## 2012-09-12 LAB — CBC
HCT: 37 % (ref 36.0–46.0)
Hemoglobin: 12.5 g/dL (ref 12.0–15.0)
MCH: 30.5 pg (ref 26.0–34.0)
MCHC: 33.8 g/dL (ref 30.0–36.0)
MCV: 90.2 fL (ref 78.0–100.0)
Platelets: 174 10*3/uL (ref 150–400)
RBC: 4.1 MIL/uL (ref 3.87–5.11)
RDW: 13.9 % (ref 11.5–15.5)
WBC: 9.5 10*3/uL (ref 4.0–10.5)

## 2012-09-12 LAB — GLUCOSE, CAPILLARY: Glucose-Capillary: 163 mg/dL — ABNORMAL HIGH (ref 70–99)

## 2012-09-12 LAB — MAGNESIUM: Magnesium: 1.6 mg/dL (ref 1.5–2.5)

## 2012-09-12 LAB — PHOSPHORUS: Phosphorus: 2.6 mg/dL (ref 2.3–4.6)

## 2012-09-12 MED ORDER — HYDROCODONE-ACETAMINOPHEN 5-325 MG PO TABS: 1.0000 | ORAL_TABLET | ORAL | Status: DC | PRN

## 2012-09-12 MED ORDER — BUPIVACAINE HCL 0.5 % IJ SOLN
INTRAMUSCULAR | Status: DC | PRN
  Administered 2012-09-11: 10 mL

## 2012-09-12 MED ORDER — SULFAMETHOXAZOLE-TRIMETHOPRIM 800-160 MG PO TABS: 1.0000 | ORAL_TABLET | Freq: Two times a day (BID) | ORAL | Status: DC

## 2012-09-12 NOTE — Progress Notes (Signed)
Pt discharged home today per Dr. Lovell Sheehan. Pt's IV site D/C'd and WNL. Pt's VS stable at this time. Pt provided with home medication list, discharge instructions and prescriptions. Pt verbalized understanding. Pt made aware of F/U appointment with Dr. Lovell Sheehan on 09/21/12. Pt verbalized understanding. Pt left floor via WC in stable condition accompanied by NT. Jorja Empie, Joyice Faster

## 2012-09-12 NOTE — Discharge Summary (Signed)
Physician Discharge Summary  Patient ID: LASHAWNDA HANCOX MRN: [id number] DOB/AGE: 1951-02-09 61 y.o.  Admit date: 09/09/2012 Discharge date: 09/12/2012  Admission Diagnoses: Abdominal pain, hypotension, cholecystitis, cholelithiasis, possible urinary tract infection  Discharge Diagnoses: Same, Klebsiella urinary tract infection Active Problems:  * No active hospital problems. *    Discharged Condition: good  Hospital Course: Patient is a 61 year old white female multiple medical problems who presented with worsening abdominal pain. She was also noted to have episodes of hypotension. She was with a diagnosis of cholecystitis, cholelithiasis, and a possible urinary tract infection. She was admitted to the intensive care unit for further monitoring. A surgery consultation was obtained. The patient was taken to the operating room on 09/11/2012 underwent a laparoscopic cholecystectomy. She tolerated the procedure well. Her postoperative course was unremarkable. Her diet was advanced without difficulty. Final urine culture revealed Klebsiella. The patient is being discharged home on postoperative day one in good improving condition.  Treatments: surgery: Laparoscopic cholecystectomy on 09/11/2012  Discharge Exam: Blood pressure 141/127, pulse 77, temperature 98.7 F (37.1 C), temperature source Oral, resp. rate 22, height 5\' 7"  (1.702 m), weight 102.7 kg (226 lb 6.6 oz), SpO2 96.00%. General appearance: alert, cooperative and no distress Resp: clear to auscultation bilaterally Cardio: regular rate and rhythm, S1, S2 normal, no murmur, click, rub or gallop GI: Soft. No rigidity noted. No distention noted. Dressings dry and intact.  Disposition: 01-Home or Self Care     Medication List     As of 09/12/2012 10:17 AM    TAKE these medications         albuterol 108 (90 BASE) MCG/ACT inhaler   Commonly known as: PROVENTIL HFA;VENTOLIN HFA   Inhale 2 puffs into the lungs every 6 (six) hours as  needed. For shortness of breath      aspirin 81 MG EC tablet   Take 81 mg by mouth daily.      cetirizine 10 MG tablet   Commonly known as: ZYRTEC   Take 10 mg by mouth daily.      furosemide 20 MG tablet   Commonly known as: LASIX   Take 20 mg by mouth daily.      gabapentin 100 MG capsule   Commonly known as: NEURONTIN   Take 100 mg by mouth 3 (three) times daily.      HYDROcodone-acetaminophen 5-325 MG per tablet   Commonly known as: NORCO/VICODIN   Take 1 tablet by mouth every 4 (four) hours as needed for pain.      insulin aspart 100 UNIT/ML injection   Commonly known as: novoLOG   Inject 10-30 Units into the skin 3 (three) times daily as needed. Based on sliding scale      insulin glargine 100 UNIT/ML injection   Commonly known as: LANTUS   Inject 80 Units into the skin at bedtime as needed. If needed for blood sugar control.      levothyroxine 150 MCG tablet   Commonly known as: SYNTHROID, LEVOTHROID   Take 150 mcg by mouth daily.      lisinopril 20 MG tablet   Commonly known as: PRINIVIL,ZESTRIL   Take 20 mg by mouth daily.      LORazepam 0.5 MG tablet   Commonly known as: ATIVAN   Take 0.5 mg by mouth daily. For anxiety      PARoxetine 20 MG tablet   Commonly known as: PAXIL   Take 20 mg by mouth every morning.      solifenacin  5 MG tablet   Commonly known as: VESICARE   Take 5 mg by mouth every morning.      sulfamethoxazole-trimethoprim 800-160 MG per tablet   Commonly known as: BACTRIM DS,SEPTRA DS   Take 1 tablet by mouth 2 (two) times daily.      TRADJENTA 5 MG Tabs tablet   Generic drug: linagliptin   Take 5 mg by mouth daily.           Follow-up Information    Follow up with Dalia Heading, MD. Schedule an appointment as soon as possible for a visit on 09/21/2012.   Contact information:   [address] [place] [address] [phone]          Signed: Franky Macho A 09/12/2012, 10:17 AM

## 2012-09-12 NOTE — Progress Notes (Signed)
Subjective: Patient feels better. She had cholecystectomy yesterday. She is tolerating oral feeding.No complaint  Objective: Vital signs in last 24 hours: Temp:  [97.7 F (36.5 C)-99.2 F (37.3 C)] 99.2 F (37.3 C) (11/05 0400) Pulse Rate:  [59-83] 77  (11/05 0500) Resp:  [13-31] 22  (11/05 0500) BP: (110-160)/(50-127) 141/127 mmHg (11/05 0300) SpO2:  [89 %-100 %] 96 % (11/05 0500) Weight:  [102.7 kg (226 lb 6.6 oz)] 102.7 kg (226 lb 6.6 oz) (11/05 0500) Weight change: 1.6 kg (3 lb 8.4 oz) Last BM Date: 09/10/12  Intake/Output from previous day: 11/04 0701 - 11/05 0700 In: 3385 [P.O.:300; I.V.:2735; IV Piggyback:350] Out: 550 [Urine:550]  PHYSICAL EXAM General appearance: alert and no distress Resp: clear to auscultation bilaterally Cardio: S1, S2 normal GI: soft and lax, bowel sound is positive, no tenderness Extremities: extremities normal, atraumatic, no cyanosis or edema  Lab Results:    @labtest @ [facility] No results found for this basename: PHART,PCO2,PO2ART,TCO2,HCO3 in the last 72 hours CULTURES Recent Results (from the past 240 hour(s))  URINE CULTURE     Status: Normal   Collection Time   09/09/12  2:11 PM      Component Value Range Status Comment   Specimen Description URINE, CATHETERIZED   Final    Special Requests NONE   Final    Culture  Setup Time 09/09/2012 20:15   Final    Colony Count >=100,000 COLONIES/ML   Final    Culture KLEBSIELLA PNEUMONIAE   Final    Report Status 09/12/2012 FINAL   Final    Organism ID, Bacteria KLEBSIELLA PNEUMONIAE   Final   MRSA PCR SCREENING     Status: Normal   Collection Time   09/09/12  5:40 PM      Component Value Range Status Comment   MRSA by PCR NEGATIVE  NEGATIVE Final    Studies/Results: Dg Chest Port 1 View  09/10/2012  *RADIOLOGY REPORT*  Clinical Data: Hypertension.  Preoperative study.  PORTABLE CHEST - 1 VIEW  Comparison: Chest x-ray 12/16/2009.  Findings: Linear opacities in the left mid lung and left  lower lobe likely represents subsegmental atelectasis.  No definite consolidative airspace disease.  No pleural effusions.  Mild crowding of the pulmonary vasculature, without frank pulmonary edema.  Mild cardiomegaly (unchanged). The patient is rotated to the left on today's exam, resulting in distortion of the mediastinal contours and reduced diagnostic sensitivity and specificity for mediastinal pathology.  Atherosclerosis in the thoracic aorta.  IMPRESSION: 1.  No radiographic evidence of acute cardiopulmonary disease. 2.  Linear opacities in the left mid lung and left lower lobe most compatible with subsegmental atelectasis. 3.  Mild cardiomegaly (unchanged). 4.  Atherosclerosis.   Original Report Authenticated By: Trudie Reed, M.D.     Medications: I have reviewed the patient's current medications.  Assesment: 1.S/P cholecystectomy 2,UTI 3. DM type II 4. hypothyroidism 5. Morbid obesity  Active Problems:  * No active hospital problems. *     Plan: - continue iv antibiotics - as surgery plan. Current treatment    LOS: 3 days   Claudia Keller 09/12/2012, 7:49 AM

## 2012-09-12 NOTE — Anesthesia Postprocedure Evaluation (Signed)
  Anesthesia Post-op Note  Patient: Claudia Keller  Procedure(s) Performed: Procedure(s) (LRB) with comments: LAPAROSCOPIC CHOLECYSTECTOMY (N/A)  Patient Location: IC-6  Anesthesia Type:General  Level of Consciousness: awake, alert , oriented and patient cooperative  Airway and Oxygen Therapy: Patient Spontanous Breathing  Post-op Pain: 3 /10, mild  Post-op Assessment: Post-op Vital signs reviewed, Patient's Cardiovascular Status Stable, Respiratory Function Stable, Patent Airway and Pain level controlled  Post-op Vital Signs: Reviewed and stable  Complications: No apparent anesthesia complications

## 2012-09-12 NOTE — Addendum Note (Signed)
Addendum  created 09/12/12 1436 by Despina Hidden, CRNA   Modules edited:Notes Section

## 2012-09-13 ENCOUNTER — Encounter (HOSPITAL_COMMUNITY): Payer: Self-pay | Admitting: General Surgery

## 2012-09-22 ENCOUNTER — Emergency Department (HOSPITAL_COMMUNITY)
Admission: EM | Admit: 2012-09-22 | Discharge: 2012-09-22 | Disposition: A | Discharge status: Home / Self Care | Payer: 59 | Attending: Emergency Medicine | Admitting: Emergency Medicine

## 2012-09-22 ENCOUNTER — Encounter (HOSPITAL_COMMUNITY): Payer: Self-pay | Admitting: Emergency Medicine

## 2012-09-22 DIAGNOSIS — K589 Irritable bowel syndrome without diarrhea: Secondary | ICD-10-CM | POA: Insufficient documentation

## 2012-09-22 DIAGNOSIS — G479 Sleep disorder, unspecified: Secondary | ICD-10-CM | POA: Insufficient documentation

## 2012-09-22 DIAGNOSIS — R5381 Other malaise: Secondary | ICD-10-CM | POA: Insufficient documentation

## 2012-09-22 DIAGNOSIS — F319 Bipolar disorder, unspecified: Secondary | ICD-10-CM | POA: Insufficient documentation

## 2012-09-22 DIAGNOSIS — F17200 Nicotine dependence, unspecified, uncomplicated: Secondary | ICD-10-CM | POA: Insufficient documentation

## 2012-09-22 DIAGNOSIS — F411 Generalized anxiety disorder: Secondary | ICD-10-CM | POA: Insufficient documentation

## 2012-09-22 DIAGNOSIS — E782 Mixed hyperlipidemia: Secondary | ICD-10-CM | POA: Insufficient documentation

## 2012-09-22 DIAGNOSIS — E119 Type 2 diabetes mellitus without complications: Secondary | ICD-10-CM | POA: Insufficient documentation

## 2012-09-22 DIAGNOSIS — E039 Hypothyroidism, unspecified: Secondary | ICD-10-CM | POA: Insufficient documentation

## 2012-09-22 DIAGNOSIS — I10 Essential (primary) hypertension: Secondary | ICD-10-CM | POA: Insufficient documentation

## 2012-09-22 DIAGNOSIS — F419 Anxiety disorder, unspecified: Secondary | ICD-10-CM

## 2012-09-22 DIAGNOSIS — Z79899 Other long term (current) drug therapy: Secondary | ICD-10-CM | POA: Insufficient documentation

## 2012-09-22 LAB — CBC WITH DIFFERENTIAL/PLATELET
Basophils Absolute: 0 10*3/uL (ref 0.0–0.1)
Basophils Relative: 1 % (ref 0–1)
Eosinophils Absolute: 0.1 10*3/uL (ref 0.0–0.7)
Eosinophils Relative: 2 % (ref 0–5)
HCT: 38.4 % (ref 36.0–46.0)
Hemoglobin: 13.2 g/dL (ref 12.0–15.0)
Lymphocytes Relative: 20 % (ref 12–46)
Lymphs Abs: 1.6 10*3/uL (ref 0.7–4.0)
MCH: 30.4 pg (ref 26.0–34.0)
MCHC: 34.4 g/dL (ref 30.0–36.0)
MCV: 88.5 fL (ref 78.0–100.0)
Monocytes Absolute: 0.8 10*3/uL (ref 0.1–1.0)
Monocytes Relative: 11 % (ref 3–12)
Neutro Abs: 5.3 10*3/uL (ref 1.7–7.7)
Neutrophils Relative %: 67 % (ref 43–77)
Platelets: 382 10*3/uL (ref 150–400)
RBC: 4.34 MIL/uL (ref 3.87–5.11)
RDW: 14 % (ref 11.5–15.5)
WBC: 7.9 10*3/uL (ref 4.0–10.5)

## 2012-09-22 LAB — URINALYSIS, ROUTINE W REFLEX MICROSCOPIC
Bilirubin Urine: NEGATIVE
Glucose, UA: NEGATIVE mg/dL
Hgb urine dipstick: NEGATIVE
Ketones, ur: NEGATIVE mg/dL
Leukocytes, UA: NEGATIVE
Nitrite: NEGATIVE
Protein, ur: NEGATIVE mg/dL
Specific Gravity, Urine: >1.03 — ABNORMAL HIGH (ref 1.005–1.030)
Urobilinogen, UA: 0.2 mg/dL (ref 0.0–1.0)
pH: 5.5 (ref 5.0–8.0)

## 2012-09-22 LAB — COMPREHENSIVE METABOLIC PANEL
ALT: 25 U/L (ref 0–35)
AST: 19 U/L (ref 0–37)
Albumin: 3.6 g/dL (ref 3.5–5.2)
Alkaline Phosphatase: 142 U/L — ABNORMAL HIGH (ref 39–117)
BUN: 39 mg/dL — ABNORMAL HIGH (ref 6–23)
CO2: 21 mEq/L (ref 19–32)
Calcium: 9.9 mg/dL (ref 8.4–10.5)
Chloride: 100 mEq/L (ref 96–112)
Creatinine, Ser: 1.68 mg/dL — ABNORMAL HIGH (ref 0.50–1.10)
GFR calc Af Amer: 37 mL/min — ABNORMAL LOW (ref >90)
GFR calc non Af Amer: 32 mL/min — ABNORMAL LOW (ref >90)
Glucose, Bld: 317 mg/dL — ABNORMAL HIGH (ref 70–99)
Potassium: 4.6 mEq/L (ref 3.5–5.1)
Sodium: 133 mEq/L — ABNORMAL LOW (ref 135–145)
Total Bilirubin: 0.2 mg/dL — ABNORMAL LOW (ref 0.3–1.2)
Total Protein: 6.8 g/dL (ref 6.0–8.3)

## 2012-09-22 LAB — RAPID URINE DRUG SCREEN, HOSP PERFORMED
Amphetamines: NOT DETECTED
Barbiturates: NOT DETECTED
Benzodiazepines: NOT DETECTED
Cocaine: NOT DETECTED
Opiates: NOT DETECTED
Tetrahydrocannabinol: NOT DETECTED

## 2012-09-22 LAB — ETHANOL: Alcohol, Ethyl (B): 11 mg/dL (ref 0–11)

## 2012-09-22 MED ORDER — SODIUM CHLORIDE 0.9 % IV SOLN
1000.0000 mL | Freq: Once | INTRAVENOUS | Status: AC
  Administered 2012-09-22: 1000 mL via INTRAVENOUS

## 2012-09-22 NOTE — ED Notes (Signed)
Pt discharged back to her home. Her neighbor will bring her home
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
## 2012-09-22 NOTE — ED Notes (Signed)
Telepsych call-in.

## 2012-09-29 ENCOUNTER — Emergency Department (HOSPITAL_COMMUNITY)
Admission: EM | Admit: 2012-09-29 | Discharge: 2012-09-30 | Disposition: A | Discharge status: Home / Self Care | Payer: MEDICAID | Attending: Emergency Medicine | Admitting: Emergency Medicine

## 2012-09-29 ENCOUNTER — Encounter (HOSPITAL_COMMUNITY): Payer: Self-pay | Admitting: *Deleted

## 2012-09-29 DIAGNOSIS — K589 Irritable bowel syndrome without diarrhea: Secondary | ICD-10-CM | POA: Insufficient documentation

## 2012-09-29 DIAGNOSIS — E119 Type 2 diabetes mellitus without complications: Secondary | ICD-10-CM | POA: Insufficient documentation

## 2012-09-29 DIAGNOSIS — F17200 Nicotine dependence, unspecified, uncomplicated: Secondary | ICD-10-CM | POA: Insufficient documentation

## 2012-09-29 DIAGNOSIS — I10 Essential (primary) hypertension: Secondary | ICD-10-CM | POA: Insufficient documentation

## 2012-09-29 DIAGNOSIS — E079 Disorder of thyroid, unspecified: Secondary | ICD-10-CM | POA: Insufficient documentation

## 2012-09-29 DIAGNOSIS — F3289 Other specified depressive episodes: Secondary | ICD-10-CM | POA: Insufficient documentation

## 2012-09-29 DIAGNOSIS — Z7982 Long term (current) use of aspirin: Secondary | ICD-10-CM | POA: Insufficient documentation

## 2012-09-29 DIAGNOSIS — F32A Depression, unspecified: Secondary | ICD-10-CM

## 2012-09-29 DIAGNOSIS — E782 Mixed hyperlipidemia: Secondary | ICD-10-CM | POA: Insufficient documentation

## 2012-09-29 DIAGNOSIS — Z794 Long term (current) use of insulin: Secondary | ICD-10-CM | POA: Insufficient documentation

## 2012-09-29 DIAGNOSIS — F329 Major depressive disorder, single episode, unspecified: Secondary | ICD-10-CM | POA: Insufficient documentation

## 2012-09-29 DIAGNOSIS — Z79899 Other long term (current) drug therapy: Secondary | ICD-10-CM | POA: Insufficient documentation

## 2012-09-29 DIAGNOSIS — Z046 Encounter for general psychiatric examination, requested by authority: Secondary | ICD-10-CM | POA: Insufficient documentation

## 2012-09-29 DIAGNOSIS — F319 Bipolar disorder, unspecified: Secondary | ICD-10-CM | POA: Insufficient documentation

## 2012-09-29 DIAGNOSIS — Z9889 Other specified postprocedural states: Secondary | ICD-10-CM | POA: Insufficient documentation

## 2012-09-29 LAB — RAPID URINE DRUG SCREEN, HOSP PERFORMED
Amphetamines: NOT DETECTED
Barbiturates: NOT DETECTED
Benzodiazepines: NOT DETECTED
Cocaine: NOT DETECTED
Opiates: NOT DETECTED
Tetrahydrocannabinol: NOT DETECTED

## 2012-09-29 LAB — COMPREHENSIVE METABOLIC PANEL
ALT: 28 U/L (ref 0–35)
AST: 23 U/L (ref 0–37)
Albumin: 3.8 g/dL (ref 3.5–5.2)
Alkaline Phosphatase: 108 U/L (ref 39–117)
BUN: 13 mg/dL (ref 6–23)
CO2: 26 mEq/L (ref 19–32)
Calcium: 10 mg/dL (ref 8.4–10.5)
Chloride: 100 mEq/L (ref 96–112)
Creatinine, Ser: 0.99 mg/dL (ref 0.50–1.10)
GFR calc Af Amer: 70 mL/min — ABNORMAL LOW (ref >90)
GFR calc non Af Amer: 61 mL/min — ABNORMAL LOW (ref >90)
Glucose, Bld: 256 mg/dL — ABNORMAL HIGH (ref 70–99)
Potassium: 4.2 mEq/L (ref 3.5–5.1)
Sodium: 138 mEq/L (ref 135–145)
Total Bilirubin: 0.6 mg/dL (ref 0.3–1.2)
Total Protein: 6.9 g/dL (ref 6.0–8.3)

## 2012-09-29 LAB — URINALYSIS, ROUTINE W REFLEX MICROSCOPIC
Bilirubin Urine: NEGATIVE
Glucose, UA: NEGATIVE mg/dL
Hgb urine dipstick: NEGATIVE
Ketones, ur: NEGATIVE mg/dL
Leukocytes, UA: NEGATIVE
Nitrite: NEGATIVE
Protein, ur: NEGATIVE mg/dL
Specific Gravity, Urine: 1.02 (ref 1.005–1.030)
Urobilinogen, UA: 0.2 mg/dL (ref 0.0–1.0)
pH: 5.5 (ref 5.0–8.0)

## 2012-09-29 LAB — CBC WITH DIFFERENTIAL/PLATELET
Basophils Absolute: 0.1 10*3/uL (ref 0.0–0.1)
Basophils Relative: 1 % (ref 0–1)
Eosinophils Absolute: 0.1 10*3/uL (ref 0.0–0.7)
Eosinophils Relative: 2 % (ref 0–5)
HCT: 40.8 % (ref 36.0–46.0)
Hemoglobin: 14.2 g/dL (ref 12.0–15.0)
Lymphocytes Relative: 35 % (ref 12–46)
Lymphs Abs: 2.6 10*3/uL (ref 0.7–4.0)
MCH: 30.7 pg (ref 26.0–34.0)
MCHC: 34.8 g/dL (ref 30.0–36.0)
MCV: 88.3 fL (ref 78.0–100.0)
Monocytes Absolute: 0.6 10*3/uL (ref 0.1–1.0)
Monocytes Relative: 8 % (ref 3–12)
Neutro Abs: 4 10*3/uL (ref 1.7–7.7)
Neutrophils Relative %: 54 % (ref 43–77)
Platelets: 322 10*3/uL (ref 150–400)
RBC: 4.62 MIL/uL (ref 3.87–5.11)
RDW: 13.8 % (ref 11.5–15.5)
WBC: 7.5 10*3/uL (ref 4.0–10.5)

## 2012-09-29 LAB — ETHANOL: Alcohol, Ethyl (B): <11 mg/dL (ref 0–11)
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
## 2012-09-29 NOTE — ED Notes (Signed)
Pt is sleeping at this time . Claudia Keller 

## 2012-09-29 NOTE — ED Notes (Signed)
Pt is asleep and sitter is at bedside. 

## 2012-09-29 NOTE — ED Notes (Signed)
Pt brought into ER in cuffs with IVC papers. Alert, talking, Papers says she threatened  To kill herself

## 2012-09-29 NOTE — ED Notes (Signed)
Pt sleeping at this time Claudia Keller 

## 2012-09-29 NOTE — ED Notes (Signed)
Pt is asleep and sitter is at bedside.

## 2012-09-30 NOTE — ED Notes (Signed)
Telepsych psychiatrist faxed recention papers to [place] which were placed in the medical records bin.

## 2012-09-30 NOTE — ED Provider Notes (Signed)
Psychiatry consult obtained and per Dr. Trisha Mangle, is psychiatrically stable at this time. He reversed IVC. He recommends outpatient followup with community mental health. He recommends patient to get psychiatric medications from his outpatient provider after being discharged from the hospital today. Outpatient resources and referrals provided.  Results for orders placed during the hospital encounter of 09/29/12  CBC WITH DIFFERENTIAL      Component Value Range   WBC 7.5  4.0 - 10.5 K/uL   RBC 4.62  3.87 - 5.11 MIL/uL   Hemoglobin 14.2  12.0 - 15.0 g/dL   HCT 28.4  13.2 - 44.0 %   MCV 88.3  78.0 - 100.0 fL   MCH 30.7  26.0 - 34.0 pg   MCHC 34.8  30.0 - 36.0 g/dL   RDW 10.2  72.5 - 36.6 %   Platelets 322  150 - 400 K/uL   Neutrophils Relative 54  43 - 77 %   Neutro Abs 4.0  1.7 - 7.7 K/uL   Lymphocytes Relative 35  12 - 46 %   Lymphs Abs 2.6  0.7 - 4.0 K/uL   Monocytes Relative 8  3 - 12 %   Monocytes Absolute 0.6  0.1 - 1.0 K/uL   Eosinophils Relative 2  0 - 5 %   Eosinophils Absolute 0.1  0.0 - 0.7 K/uL   Basophils Relative 1  0 - 1 %   Basophils Absolute 0.1  0.0 - 0.1 K/uL  COMPREHENSIVE METABOLIC PANEL      Component Value Range   Sodium 138  135 - 145 mEq/L   Potassium 4.2  3.5 - 5.1 mEq/L   Chloride 100  96 - 112 mEq/L   CO2 26  19 - 32 mEq/L   Glucose, Bld 256 (*) 70 - 99 mg/dL   BUN 13  6 - 23 mg/dL   Creatinine, Ser 4.40  0.50 - 1.10 mg/dL   Calcium 34.7  8.4 - 42.5 mg/dL   Total Protein 6.9  6.0 - 8.3 g/dL   Albumin 3.8  3.5 - 5.2 g/dL   AST 23  0 - 37 U/L   ALT 28  0 - 35 U/L   Alkaline Phosphatase 108  39 - 117 U/L   Total Bilirubin 0.6  0.3 - 1.2 mg/dL   GFR calc non Af Amer 61 (*) >90 mL/min   GFR calc Af Amer 70 (*) >90 mL/min  ETHANOL      Component Value Range   Alcohol, Ethyl (B) <11  0 - 11 mg/dL  URINALYSIS, ROUTINE W REFLEX MICROSCOPIC      Component Value Range   Color, Urine YELLOW  YELLOW   APPearance CLEAR  CLEAR   Specific Gravity, Urine 1.020   1.005 - 1.030   pH 5.5  5.0 - 8.0   Glucose, UA NEGATIVE  NEGATIVE mg/dL   Hgb urine dipstick NEGATIVE  NEGATIVE   Bilirubin Urine NEGATIVE  NEGATIVE   Ketones, ur NEGATIVE  NEGATIVE mg/dL   Protein, ur NEGATIVE  NEGATIVE mg/dL   Urobilinogen, UA 0.2  0.0 - 1.0 mg/dL   Nitrite NEGATIVE  NEGATIVE   Leukocytes, UA NEGATIVE  NEGATIVE  URINE RAPID DRUG SCREEN (HOSP PERFORMED)      Component Value Range   Opiates NONE DETECTED  NONE DETECTED   Cocaine NONE DETECTED  NONE DETECTED   Benzodiazepines NONE DETECTED  NONE DETECTED   Amphetamines NONE DETECTED  NONE DETECTED   Tetrahydrocannabinol NONE DETECTED  NONE DETECTED  Barbiturates NONE DETECTED  NONE DETECTED        Sunnie Nielsen, MD 09/30/12 [phone]

## 2012-09-30 NOTE — ED Notes (Signed)
Pts brother came to pick patient up and transport pt back home

## 2012-12-07 NOTE — Progress Notes (Signed)
UR Chart Review Completed  

## 2014-04-05 ENCOUNTER — Emergency Department (HOSPITAL_COMMUNITY)
Admission: EM | Admit: 2014-04-05 | Discharge: 2014-04-06 | Disposition: A | Discharge status: Home / Self Care | Payer: MEDICAID | Attending: Emergency Medicine | Admitting: Emergency Medicine

## 2014-04-05 ENCOUNTER — Encounter (HOSPITAL_COMMUNITY): Payer: Self-pay | Admitting: Emergency Medicine

## 2014-04-05 DIAGNOSIS — E119 Type 2 diabetes mellitus without complications: Secondary | ICD-10-CM | POA: Insufficient documentation

## 2014-04-05 DIAGNOSIS — I10 Essential (primary) hypertension: Secondary | ICD-10-CM | POA: Insufficient documentation

## 2014-04-05 DIAGNOSIS — F3289 Other specified depressive episodes: Secondary | ICD-10-CM | POA: Insufficient documentation

## 2014-04-05 DIAGNOSIS — Z8742 Personal history of other diseases of the female genital tract: Secondary | ICD-10-CM | POA: Insufficient documentation

## 2014-04-05 DIAGNOSIS — Z860100 Personal history of colon polyps, unspecified: Secondary | ICD-10-CM | POA: Insufficient documentation

## 2014-04-05 DIAGNOSIS — F22 Delusional disorders: Secondary | ICD-10-CM | POA: Insufficient documentation

## 2014-04-05 DIAGNOSIS — F329 Major depressive disorder, single episode, unspecified: Secondary | ICD-10-CM | POA: Insufficient documentation

## 2014-04-05 DIAGNOSIS — E782 Mixed hyperlipidemia: Secondary | ICD-10-CM | POA: Insufficient documentation

## 2014-04-05 DIAGNOSIS — Z794 Long term (current) use of insulin: Secondary | ICD-10-CM | POA: Insufficient documentation

## 2014-04-05 DIAGNOSIS — E039 Hypothyroidism, unspecified: Secondary | ICD-10-CM | POA: Insufficient documentation

## 2014-04-05 DIAGNOSIS — Z8719 Personal history of other diseases of the digestive system: Secondary | ICD-10-CM | POA: Insufficient documentation

## 2014-04-05 DIAGNOSIS — Z79899 Other long term (current) drug therapy: Secondary | ICD-10-CM | POA: Insufficient documentation

## 2014-04-05 DIAGNOSIS — Z7982 Long term (current) use of aspirin: Secondary | ICD-10-CM | POA: Insufficient documentation

## 2014-04-05 DIAGNOSIS — F32A Depression, unspecified: Secondary | ICD-10-CM

## 2014-04-05 DIAGNOSIS — F4310 Post-traumatic stress disorder, unspecified: Secondary | ICD-10-CM | POA: Insufficient documentation

## 2014-04-05 DIAGNOSIS — F17200 Nicotine dependence, unspecified, uncomplicated: Secondary | ICD-10-CM | POA: Insufficient documentation

## 2014-04-05 HISTORY — DX: Post-traumatic stress disorder, unspecified: F43.10

## 2014-04-05 LAB — COMPREHENSIVE METABOLIC PANEL
ALT: 30 U/L (ref 0–35)
AST: 32 U/L (ref 0–37)
Albumin: 3.8 g/dL (ref 3.5–5.2)
Alkaline Phosphatase: 121 U/L — ABNORMAL HIGH (ref 39–117)
BUN: 9 mg/dL (ref 6–23)
CO2: 23 mEq/L (ref 19–32)
Calcium: 9.4 mg/dL (ref 8.4–10.5)
Chloride: 95 mEq/L — ABNORMAL LOW (ref 96–112)
Creatinine, Ser: 1.26 mg/dL — ABNORMAL HIGH (ref 0.50–1.10)
GFR calc Af Amer: 52 mL/min — ABNORMAL LOW (ref >90)
GFR calc non Af Amer: 45 mL/min — ABNORMAL LOW (ref >90)
Glucose, Bld: 154 mg/dL — ABNORMAL HIGH (ref 70–99)
Potassium: 3.3 mEq/L — ABNORMAL LOW (ref 3.7–5.3)
Sodium: 136 mEq/L — ABNORMAL LOW (ref 137–147)
Total Bilirubin: 1.1 mg/dL (ref 0.3–1.2)
Total Protein: 7.1 g/dL (ref 6.0–8.3)

## 2014-04-05 LAB — CBC WITH DIFFERENTIAL/PLATELET
Basophils Absolute: 0 10*3/uL (ref 0.0–0.1)
Basophils Relative: 0 % (ref 0–1)
Eosinophils Absolute: 0 10*3/uL (ref 0.0–0.7)
Eosinophils Relative: 0 % (ref 0–5)
HCT: 43.4 % (ref 36.0–46.0)
Hemoglobin: 14.6 g/dL (ref 12.0–15.0)
Lymphocytes Relative: 13 % (ref 12–46)
Lymphs Abs: 1.8 10*3/uL (ref 0.7–4.0)
MCH: 29.7 pg (ref 26.0–34.0)
MCHC: 33.6 g/dL (ref 30.0–36.0)
MCV: 88.4 fL (ref 78.0–100.0)
Monocytes Absolute: 0.9 10*3/uL (ref 0.1–1.0)
Monocytes Relative: 6 % (ref 3–12)
Neutro Abs: 11.5 10*3/uL — ABNORMAL HIGH (ref 1.7–7.7)
Neutrophils Relative %: 81 % — ABNORMAL HIGH (ref 43–77)
Platelets: 243 10*3/uL (ref 150–400)
RBC: 4.91 MIL/uL (ref 3.87–5.11)
RDW: 13.9 % (ref 11.5–15.5)
WBC: 14.2 10*3/uL — ABNORMAL HIGH (ref 4.0–10.5)

## 2014-04-05 LAB — ETHANOL: Alcohol, Ethyl (B): <11 mg/dL (ref 0–11)

## 2014-04-05 MED ORDER — ACETAMINOPHEN 325 MG PO TABS: 650.0000 mg | ORAL_TABLET | ORAL | Status: DC | PRN

## 2014-04-05 MED ORDER — ZOLPIDEM TARTRATE 5 MG PO TABS
5.0000 mg | ORAL_TABLET | Freq: Every evening | ORAL | Status: DC | PRN
  Filled 2014-04-05: qty 1

## 2014-04-05 MED ORDER — ALUM & MAG HYDROXIDE-SIMETH 200-200-20 MG/5ML PO SUSP: 30.0000 mL | ORAL | Status: DC | PRN

## 2014-04-05 MED ORDER — NICOTINE 21 MG/24HR TD PT24
21.0000 mg | MEDICATED_PATCH | Freq: Every day | TRANSDERMAL | Status: DC
  Filled 2014-04-05: qty 1

## 2014-04-05 MED ORDER — IBUPROFEN 400 MG PO TABS: 600.0000 mg | ORAL_TABLET | Freq: Three times a day (TID) | ORAL | Status: DC | PRN

## 2014-04-05 MED ORDER — ONDANSETRON HCL 4 MG PO TABS
4.0000 mg | ORAL_TABLET | Freq: Three times a day (TID) | ORAL | Status: DC | PRN
Start: 2014-04-05 — End: 2014-04-06

## 2014-04-05 MED ORDER — LORAZEPAM 1 MG PO TABS
1.0000 mg | ORAL_TABLET | Freq: Three times a day (TID) | ORAL | Status: DC | PRN
  Administered 2014-04-06: 1 mg via ORAL
  Filled 2014-04-05: qty 1

## 2014-04-05 NOTE — ED Notes (Signed)
Helped pt undress out of her clothes and into paper scrubs
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
## 2014-04-06 LAB — URINALYSIS, ROUTINE W REFLEX MICROSCOPIC
Bilirubin Urine: NEGATIVE
Glucose, UA: NEGATIVE mg/dL
Hgb urine dipstick: NEGATIVE
Ketones, ur: 15 mg/dL — AB
Leukocytes, UA: NEGATIVE
Nitrite: NEGATIVE
Protein, ur: 100 mg/dL — AB
Specific Gravity, Urine: 1.02 (ref 1.005–1.030)
Urobilinogen, UA: 0.2 mg/dL (ref 0.0–1.0)
pH: 6 (ref 5.0–8.0)

## 2014-04-06 LAB — URINE MICROSCOPIC-ADD ON

## 2014-04-06 LAB — CBG MONITORING, ED: Glucose-Capillary: 131 mg/dL — ABNORMAL HIGH (ref 70–99)

## 2014-04-06 LAB — RAPID URINE DRUG SCREEN, HOSP PERFORMED
Amphetamines: NOT DETECTED
Barbiturates: NOT DETECTED
Benzodiazepines: NOT DETECTED
Cocaine: NOT DETECTED
Opiates: NOT DETECTED
Tetrahydrocannabinol: NOT DETECTED

## 2014-04-06 MED ORDER — LORAZEPAM 1 MG PO TABS
0.5000 mg | ORAL_TABLET | Freq: Every day | ORAL | Status: DC
  Administered 2014-04-06: 0.5 mg via ORAL
  Filled 2014-04-06: qty 1

## 2014-04-06 MED ORDER — LORAZEPAM 1 MG PO TABS
1.0000 mg | ORAL_TABLET | Freq: Once | ORAL | Status: AC
  Administered 2014-04-06: 1 mg via ORAL
  Filled 2014-04-06: qty 1

## 2014-04-06 MED ORDER — FUROSEMIDE 40 MG PO TABS
20.0000 mg | ORAL_TABLET | Freq: Every day | ORAL | Status: DC
  Administered 2014-04-06: 20 mg via ORAL
  Filled 2014-04-06: qty 1

## 2014-04-06 MED ORDER — LEVOTHYROXINE SODIUM 150 MCG PO TABS
150.0000 ug | ORAL_TABLET | Freq: Every day | ORAL | Status: DC
  Administered 2014-04-06: 150 ug via ORAL
  Filled 2014-04-06 (×2): qty 1

## 2014-04-06 MED ORDER — ALBUTEROL SULFATE HFA 108 (90 BASE) MCG/ACT IN AERS: 2.0000 | INHALATION_SPRAY | Freq: Four times a day (QID) | RESPIRATORY_TRACT | Status: DC | PRN

## 2014-04-06 MED ORDER — SIMVASTATIN 20 MG PO TABS
40.0000 mg | ORAL_TABLET | Freq: Every evening | ORAL | Status: DC
  Filled 2014-04-06: qty 1

## 2014-04-06 MED ORDER — LISINOPRIL 10 MG PO TABS
20.0000 mg | ORAL_TABLET | Freq: Every day | ORAL | Status: DC
  Administered 2014-04-06: 20 mg via ORAL
  Filled 2014-04-06: qty 2

## 2014-04-06 MED ORDER — LINAGLIPTIN 5 MG PO TABS
5.0000 mg | ORAL_TABLET | Freq: Every day | ORAL | Status: DC
  Administered 2014-04-06: 5 mg via ORAL
  Filled 2014-04-06 (×2): qty 1

## 2014-04-06 MED ORDER — LORATADINE 10 MG PO TABS
10.0000 mg | ORAL_TABLET | Freq: Every day | ORAL | Status: DC
  Administered 2014-04-06: 10 mg via ORAL
  Filled 2014-04-06: qty 1

## 2014-04-06 MED ORDER — GABAPENTIN 100 MG PO CAPS
100.0000 mg | ORAL_CAPSULE | Freq: Three times a day (TID) | ORAL | Status: DC
  Administered 2014-04-06: 100 mg via ORAL
  Filled 2014-04-06 (×5): qty 1

## 2014-04-06 MED ORDER — POTASSIUM CHLORIDE CRYS ER 20 MEQ PO TBCR
40.0000 meq | EXTENDED_RELEASE_TABLET | Freq: Once | ORAL | Status: AC
  Administered 2014-04-06: 40 meq via ORAL
  Filled 2014-04-06: qty 2

## 2014-04-06 MED ORDER — LORAZEPAM 1 MG PO TABS
1.0000 mg | ORAL_TABLET | Freq: Once | ORAL | Status: DC
  Filled 2014-04-06: qty 1

## 2014-04-06 MED ORDER — PAROXETINE HCL 20 MG PO TABS
20.0000 mg | ORAL_TABLET | Freq: Every morning | ORAL | Status: DC
  Administered 2014-04-06: 20 mg via ORAL
  Filled 2014-04-06 (×2): qty 1

## 2014-04-06 MED ORDER — INSULIN ASPART 100 UNIT/ML ~~LOC~~ SOLN: 10.0000 [IU] | Freq: Three times a day (TID) | SUBCUTANEOUS | Status: DC | PRN

## 2014-04-06 MED ORDER — INSULIN GLARGINE 100 UNIT/ML ~~LOC~~ SOLN
80.0000 [IU] | Freq: Every day | SUBCUTANEOUS | Status: DC
  Filled 2014-04-06: qty 0.8

## 2014-04-06 MED ORDER — HYDROCODONE-ACETAMINOPHEN 5-325 MG PO TABS: 1.0000 | ORAL_TABLET | ORAL | Status: DC | PRN

## 2014-04-06 MED ORDER — ASPIRIN EC 81 MG PO TBEC
81.0000 mg | DELAYED_RELEASE_TABLET | Freq: Every day | ORAL | Status: DC
  Administered 2014-04-06: 81 mg via ORAL
  Filled 2014-04-06 (×2): qty 1

## 2014-04-06 NOTE — ED Provider Notes (Signed)
No suicidal or homicidal ideations. Patient was evaluated by behavior health. Therapist and psychiatrist feel patient is able to go home.  Nat Christen, MD 04/06/14 [phone]

## 2014-04-06 NOTE — BH Assessment (Signed)
[facility] Assessment Progress Note   Spoke with Dr. Sabra Heck and took pt. history, scheduled assessment for 7:45 with nurse.

## 2014-04-06 NOTE — ED Notes (Signed)
Case manager talking with pt.  About living arrangements.

## 2014-04-06 NOTE — ED Notes (Signed)
Communicated with patient regarding issues at home.  Informed to have son contacted once ready for discharge.  Son's name Corrin Parker phone number [phone] or daughter in law Skeet Simmer [phone]  Was able to leave a message for a return call on voicemail to [facility].  Called son but was not able to leave a voicemail.

## 2014-04-06 NOTE — Discharge Instructions (Signed)
Followup with your regular mental health services
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
## 2014-05-30 ENCOUNTER — Emergency Department (HOSPITAL_COMMUNITY)
Admission: EM | Admit: 2014-05-30 | Discharge: 2014-05-31 | Disposition: A | Discharge status: Home / Self Care | Payer: MEDICAID | Attending: Emergency Medicine | Admitting: Emergency Medicine

## 2014-05-30 ENCOUNTER — Encounter (HOSPITAL_COMMUNITY): Payer: Self-pay | Admitting: Emergency Medicine

## 2014-05-30 DIAGNOSIS — Z794 Long term (current) use of insulin: Secondary | ICD-10-CM | POA: Diagnosis not present

## 2014-05-30 DIAGNOSIS — F329 Major depressive disorder, single episode, unspecified: Secondary | ICD-10-CM | POA: Insufficient documentation

## 2014-05-30 DIAGNOSIS — E119 Type 2 diabetes mellitus without complications: Secondary | ICD-10-CM | POA: Diagnosis not present

## 2014-05-30 DIAGNOSIS — E782 Mixed hyperlipidemia: Secondary | ICD-10-CM | POA: Insufficient documentation

## 2014-05-30 DIAGNOSIS — I10 Essential (primary) hypertension: Secondary | ICD-10-CM | POA: Diagnosis not present

## 2014-05-30 DIAGNOSIS — Z7982 Long term (current) use of aspirin: Secondary | ICD-10-CM | POA: Insufficient documentation

## 2014-05-30 DIAGNOSIS — R45851 Suicidal ideations: Secondary | ICD-10-CM | POA: Diagnosis not present

## 2014-05-30 DIAGNOSIS — E039 Hypothyroidism, unspecified: Secondary | ICD-10-CM | POA: Insufficient documentation

## 2014-05-30 DIAGNOSIS — Z860100 Personal history of colon polyps, unspecified: Secondary | ICD-10-CM | POA: Insufficient documentation

## 2014-05-30 DIAGNOSIS — Z862 Personal history of diseases of the blood and blood-forming organs and certain disorders involving the immune mechanism: Secondary | ICD-10-CM | POA: Diagnosis not present

## 2014-05-30 DIAGNOSIS — F3289 Other specified depressive episodes: Secondary | ICD-10-CM | POA: Insufficient documentation

## 2014-05-30 DIAGNOSIS — F17200 Nicotine dependence, unspecified, uncomplicated: Secondary | ICD-10-CM | POA: Diagnosis not present

## 2014-05-30 DIAGNOSIS — Z8639 Personal history of other endocrine, nutritional and metabolic disease: Secondary | ICD-10-CM | POA: Insufficient documentation

## 2014-05-30 DIAGNOSIS — F319 Bipolar disorder, unspecified: Secondary | ICD-10-CM | POA: Diagnosis not present

## 2014-05-30 DIAGNOSIS — Z008 Encounter for other general examination: Secondary | ICD-10-CM | POA: Insufficient documentation

## 2014-05-30 DIAGNOSIS — Z8719 Personal history of other diseases of the digestive system: Secondary | ICD-10-CM | POA: Insufficient documentation

## 2014-05-30 DIAGNOSIS — F32A Depression, unspecified: Secondary | ICD-10-CM

## 2014-05-30 LAB — SALICYLATE LEVEL: Salicylate Lvl: <2 mg/dL — ABNORMAL LOW (ref 2.8–20.0)

## 2014-05-30 LAB — COMPREHENSIVE METABOLIC PANEL
ALT: 25 U/L (ref 0–35)
AST: 30 U/L (ref 0–37)
Albumin: 3.7 g/dL (ref 3.5–5.2)
Alkaline Phosphatase: 120 U/L — ABNORMAL HIGH (ref 39–117)
Anion gap: 12 (ref 5–15)
BUN: 9 mg/dL (ref 6–23)
CO2: 27 mEq/L (ref 19–32)
Calcium: 9.6 mg/dL (ref 8.4–10.5)
Chloride: 99 mEq/L (ref 96–112)
Creatinine, Ser: 1.15 mg/dL — ABNORMAL HIGH (ref 0.50–1.10)
GFR calc Af Amer: 58 mL/min — ABNORMAL LOW (ref >90)
GFR calc non Af Amer: 50 mL/min — ABNORMAL LOW (ref >90)
Glucose, Bld: 206 mg/dL — ABNORMAL HIGH (ref 70–99)
Potassium: 4 mEq/L (ref 3.7–5.3)
Sodium: 138 mEq/L (ref 137–147)
Total Bilirubin: 0.4 mg/dL (ref 0.3–1.2)
Total Protein: 6.5 g/dL (ref 6.0–8.3)

## 2014-05-30 LAB — CBC
HCT: 44.8 % (ref 36.0–46.0)
Hemoglobin: 15.6 g/dL — ABNORMAL HIGH (ref 12.0–15.0)
MCH: 31.2 pg (ref 26.0–34.0)
MCHC: 34.8 g/dL (ref 30.0–36.0)
MCV: 89.6 fL (ref 78.0–100.0)
Platelets: 239 10*3/uL (ref 150–400)
RBC: 5 MIL/uL (ref 3.87–5.11)
RDW: 14.3 % (ref 11.5–15.5)
WBC: 11.8 10*3/uL — ABNORMAL HIGH (ref 4.0–10.5)

## 2014-05-30 LAB — ETHANOL: Alcohol, Ethyl (B): <11 mg/dL (ref 0–11)

## 2014-05-30 LAB — ACETAMINOPHEN LEVEL: Acetaminophen (Tylenol), Serum: <15 ug/mL (ref 10–30)

## 2014-05-30 LAB — CBG MONITORING, ED: Glucose-Capillary: 172 mg/dL — ABNORMAL HIGH (ref 70–99)

## 2014-05-30 MED ORDER — INSULIN GLARGINE 100 UNIT/ML ~~LOC~~ SOLN
60.0000 [IU] | Freq: Every day | SUBCUTANEOUS | Status: DC
  Administered 2014-05-30: 60 [IU] via SUBCUTANEOUS
  Filled 2014-05-30 (×2): qty 0.6

## 2014-05-30 MED ORDER — LORAZEPAM 1 MG PO TABS: 1.0000 mg | ORAL_TABLET | Freq: Three times a day (TID) | ORAL | Status: DC | PRN

## 2014-05-30 MED ORDER — LISINOPRIL 10 MG PO TABS
20.0000 mg | ORAL_TABLET | Freq: Every day | ORAL | Status: DC
  Administered 2014-05-31: 20 mg via ORAL
  Filled 2014-05-30: qty 2

## 2014-05-30 MED ORDER — LEVOTHYROXINE SODIUM 88 MCG PO TABS
88.0000 ug | ORAL_TABLET | Freq: Every day | ORAL | Status: DC
  Administered 2014-05-31: 88 ug via ORAL
  Filled 2014-05-30 (×2): qty 1

## 2014-05-30 MED ORDER — CLONAZEPAM 0.5 MG PO TABS
1.0000 mg | ORAL_TABLET | Freq: Every evening | ORAL | Status: DC | PRN
  Administered 2014-05-30: 1 mg via ORAL
  Filled 2014-05-30: qty 2

## 2014-05-30 MED ORDER — LINAGLIPTIN 5 MG PO TABS
5.0000 mg | ORAL_TABLET | Freq: Every day | ORAL | Status: DC
  Administered 2014-05-31: 5 mg via ORAL
  Filled 2014-05-30 (×3): qty 1

## 2014-05-30 MED ORDER — FUROSEMIDE 40 MG PO TABS
20.0000 mg | ORAL_TABLET | Freq: Every day | ORAL | Status: DC
  Administered 2014-05-31: 20 mg via ORAL
  Filled 2014-05-30: qty 1

## 2014-05-30 MED ORDER — PAROXETINE HCL 20 MG PO TABS
40.0000 mg | ORAL_TABLET | Freq: Every day | ORAL | Status: DC
  Administered 2014-05-31: 40 mg via ORAL
  Filled 2014-05-30 (×2): qty 2

## 2014-05-30 MED ORDER — GABAPENTIN 100 MG PO CAPS
100.0000 mg | ORAL_CAPSULE | Freq: Three times a day (TID) | ORAL | Status: DC
  Administered 2014-05-30 – 2014-05-31 (×2): 100 mg via ORAL
  Filled 2014-05-30 (×5): qty 1

## 2014-05-30 MED ORDER — ALBUTEROL SULFATE HFA 108 (90 BASE) MCG/ACT IN AERS: 2.0000 | INHALATION_SPRAY | RESPIRATORY_TRACT | Status: DC | PRN

## 2014-05-30 MED ORDER — INSULIN ASPART 100 UNIT/ML ~~LOC~~ SOLN
0.0000 [IU] | SUBCUTANEOUS | Status: DC
  Administered 2014-05-30: 4 [IU] via SUBCUTANEOUS
  Filled 2014-05-30: qty 1

## 2014-05-30 MED ORDER — ASPIRIN 81 MG PO TBEC: 81.0000 mg | DELAYED_RELEASE_TABLET | Freq: Every day | ORAL | Status: DC

## 2014-05-30 MED ORDER — DARIFENACIN HYDROBROMIDE ER 7.5 MG PO TB24
7.5000 mg | ORAL_TABLET | Freq: Every day | ORAL | Status: DC
  Administered 2014-05-31: 7.5 mg via ORAL
  Filled 2014-05-30 (×3): qty 1

## 2014-05-30 MED ORDER — IBUPROFEN 400 MG PO TABS: 600.0000 mg | ORAL_TABLET | Freq: Three times a day (TID) | ORAL | Status: DC | PRN

## 2014-05-30 MED ORDER — ACETAMINOPHEN 325 MG PO TABS: 650.0000 mg | ORAL_TABLET | ORAL | Status: DC | PRN

## 2014-05-30 MED ORDER — ASPIRIN EC 81 MG PO TBEC
81.0000 mg | DELAYED_RELEASE_TABLET | Freq: Every day | ORAL | Status: DC
  Administered 2014-05-31: 81 mg via ORAL
  Filled 2014-05-30 (×3): qty 1

## 2014-05-30 MED ORDER — GABAPENTIN 100 MG PO CAPS
ORAL_CAPSULE | ORAL | Status: AC
  Filled 2014-05-30: qty 1

## 2014-05-30 MED ORDER — LORATADINE 10 MG PO TABS
10.0000 mg | ORAL_TABLET | Freq: Every day | ORAL | Status: DC
  Administered 2014-05-31: 10 mg via ORAL
  Filled 2014-05-30: qty 1

## 2014-05-30 MED ORDER — ONDANSETRON HCL 4 MG PO TABS
4.0000 mg | ORAL_TABLET | Freq: Three times a day (TID) | ORAL | Status: DC | PRN
  Administered 2014-05-31: 4 mg via ORAL
  Filled 2014-05-30: qty 1

## 2014-05-30 MED ORDER — SIMVASTATIN 20 MG PO TABS
40.0000 mg | ORAL_TABLET | Freq: Every evening | ORAL | Status: DC
  Administered 2014-05-30: 40 mg via ORAL
  Filled 2014-05-30: qty 1
  Filled 2014-05-30: qty 2
  Filled 2014-05-30: qty 1

## 2014-05-30 MED ORDER — TRAZODONE HCL 50 MG PO TABS
50.0000 mg | ORAL_TABLET | Freq: Every day | ORAL | Status: DC
  Administered 2014-05-30: 50 mg via ORAL
  Filled 2014-05-30: qty 1

## 2014-05-30 MED ORDER — PAROXETINE HCL 20 MG PO TABS: 40.0000 mg | ORAL_TABLET | Freq: Every day | ORAL | Status: DC

## 2014-05-30 MED ORDER — NICOTINE 21 MG/24HR TD PT24
21.0000 mg | MEDICATED_PATCH | Freq: Every day | TRANSDERMAL | Status: DC
  Filled 2014-05-30: qty 1
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
## 2014-05-30 NOTE — ED Notes (Signed)
TTS consult underway. Telepysch monitor at bedside.

## 2014-05-30 NOTE — ED Notes (Signed)
MD at bedside. 

## 2014-05-30 NOTE — ED Provider Notes (Signed)
CSN: [id number]     Arrival date & time 05/30/14  1812 History   First MD Initiated Contact with Patient 05/30/14 [place]     Chief Complaint  Patient presents with  . V70.1     (Consider location/radiation/quality/duration/timing/severity/associated sxs/prior Treatment) HPI Comments: Patient presents to the ER for evaluation of depression. Patient reports that she has a lot of stressors at home. Patient has been experiencing increased depression. She has a history of depression with suicide attempt by overdose approximately 20 years ago. She does report that she has had multiple psychiatric hospitalizations since then. Patient reports that symptoms significantly worsened today, she started to feel like she did not want to live anymore. She started to think about killing herself by either cutting her wrists or overdosing. Patient still feeling suicidal. She reports that if she went home she "might succeed this time".   Past Medical History  Diagnosis Date  . Essential hypertension, benign   . Type 2 diabetes mellitus   . Hypothyroidism   . Mixed hyperlipidemia   . Bipolar disorder   . Hx of colonic polyps   . Stress incontinence, female   . S/P colonoscopy Jan 2011    RMR: pancolonic diverticula, polypoid rectal mucosa with  prominent lymphoid aggregates, repeat in Jan 2016   . Irritable bowel syndrome   . PTSD (post-traumatic stress disorder)    Past Surgical History  Procedure Laterality Date  . Bilateral tubal ligation    . Colonoscopy  11/17/2009    normal rectum/  . Esophagogastroduodenoscopy  11/23/2011    Procedure: ESOPHAGOGASTRODUODENOSCOPY (EGD);  Surgeon: Daneil Dolin, MD;  [facility];  Service: Endoscopy;  Laterality: N/A;  9:00  . Maloney dilation  11/23/2011    Procedure: Venia Minks DILATION;  Surgeon: Daneil Dolin, MD;  Location: [facility];  Service: Endoscopy;  Laterality: N/A;  . Cholecystectomy  09/11/2012    Procedure: LAPAROSCOPIC CHOLECYSTECTOMY;   Surgeon: Jamesetta So, MD;  Location: [facility] ORS;  Service: General;  Laterality: N/A;   Family History  Problem Relation Age of Onset  . Coronary artery disease    . Diabetes type II    . Schizophrenia Brother   . Stroke Brother   . Cancer Mother     unsure what type  . Heart disease Father   . Heart attack Father   . Colon cancer Neg Hx    History  Substance Use Topics  . Smoking status: Current Every Day Smoker -- 2.00 packs/day for 31 years    Types: Cigarettes  . Smokeless tobacco: Never Used     Comment: since age 88  . Alcohol Use: No   OB History   Grav Para Term Preterm Abortions TAB SAB Ect Mult Living                 Review of Systems  Psychiatric/Behavioral: Positive for suicidal ideas and dysphoric mood.  All other systems reviewed and are negative.     Allergies  Review of patient's allergies indicates no known allergies.  Home Medications   Prior to Admission medications   Medication Sig Start Date End Date Taking? Authorizing Provider  albuterol (PROVENTIL HFA;VENTOLIN HFA) 108 (90 BASE) MCG/ACT inhaler Inhale 2 puffs into the lungs every 6 (six) hours as needed. For shortness of breath     Historical Provider, MD  aspirin 81 MG EC tablet Take 81 mg by mouth daily.      Historical Provider, MD  cetirizine (ZYRTEC) 10  MG tablet Take 10 mg by mouth daily.     Historical Provider, MD  clonazePAM (KLONOPIN) 0.5 MG tablet Take 0.5 mg by mouth at bedtime.    Historical Provider, MD  furosemide (LASIX) 20 MG tablet Take 20 mg by mouth daily.     Historical Provider, MD  gabapentin (NEURONTIN) 100 MG capsule Take 100 mg by mouth 3 (three) times daily.     Historical Provider, MD  insulin glargine (LANTUS) 100 UNIT/ML injection Inject 80 Units into the skin at bedtime as needed. If needed for blood sugar control.    Historical Provider, MD  insulin lispro (HUMALOG) 100 UNIT/ML injection Inject 10-30 Units into the skin 3 (three) times daily as needed for high  blood sugar (Uses according to a sliding scale at home).    Historical Provider, MD  levothyroxine (SYNTHROID, LEVOTHROID) 150 MCG tablet Take 150 mcg by mouth daily.    Historical Provider, MD  linagliptin (TRADJENTA) 5 MG TABS tablet Take 5 mg by mouth daily.    Historical Provider, MD  lisinopril (PRINIVIL,ZESTRIL) 20 MG tablet Take 20 mg by mouth daily.      Historical Provider, MD  PARoxetine (PAXIL) 20 MG tablet Take 20 mg by mouth every morning.     Historical Provider, MD  simvastatin (ZOCOR) 40 MG tablet Take 40 mg by mouth every evening.    Historical Provider, MD  solifenacin (VESICARE) 5 MG tablet Take 5 mg by mouth every morning.     Historical Provider, MD   BP 166/94  Pulse 102  Temp(Src) 98.3 F (36.8 C) (Oral)  Resp 14  Ht 5\' 5"  (1.651 m)  Wt 240 lb (108.863 kg)  BMI 39.94 kg/m2  SpO2 96% Physical Exam  Constitutional: She is oriented to person, place, and time. She appears well-developed and well-nourished. No distress.  HENT:  Head: Normocephalic and atraumatic.  Right Ear: Hearing normal.  Left Ear: Hearing normal.  Nose: Nose normal.  Mouth/Throat: Oropharynx is clear and moist and mucous membranes are normal.  Eyes: Conjunctivae and EOM are normal. Pupils are equal, round, and reactive to light.  Neck: Normal range of motion. Neck supple.  Cardiovascular: Regular rhythm, S1 normal and S2 normal.  Exam reveals no gallop and no friction rub.   No murmur heard. Pulmonary/Chest: Effort normal and breath sounds normal. No respiratory distress. She exhibits no tenderness.  Abdominal: Soft. Normal appearance and bowel sounds are normal. There is no hepatosplenomegaly. There is no tenderness. There is no rebound, no guarding, no tenderness at McBurney's point and negative Murphy's sign. No hernia.  Musculoskeletal: Normal range of motion.  Neurological: She is alert and oriented to person, place, and time. She has normal strength. No cranial nerve deficit or sensory  deficit. Coordination normal. GCS eye subscore is 4. GCS verbal subscore is 5. GCS motor subscore is 6.  Skin: Skin is warm, dry and intact. No rash noted. No cyanosis.  Psychiatric: Her speech is normal and behavior is normal. She exhibits a depressed mood. She expresses suicidal ideation. She expresses suicidal plans.    ED Course  Procedures (including critical care time) Labs Review Labs Reviewed  ACETAMINOPHEN LEVEL  CBC  COMPREHENSIVE METABOLIC PANEL  ETHANOL  SALICYLATE LEVEL  URINE RAPID DRUG SCREEN (HOSP PERFORMED)    Imaging Review No results found.   EKG Interpretation None      MDM   Final diagnoses:  None  De[ression  Patient presents to the ER for evaluation of increasing depression. Patient  has a long history of depression with multiple hospitalizations. She does have a previous suicide attempt. The patient is now feeling suicidal. She has a plan to either cut her wrists or to overdose. She will require psychiatric evaluation for further management. Medical screening exam to be performed, Behavioral Health evaluation pending.    Orpah Greek, MD 05/30/14 [phone]

## 2014-05-30 NOTE — BH Assessment (Signed)
Clinician contacted Dr. Wilson Singer for report prior to assessing pt. Pt reporting increased depression and SI due to family issues. Pt has hx of suicide attempt. Assessment to be initiated.   Claudia Keller, MSW, LCSW Triage Specialist [phone]
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
## 2014-05-30 NOTE — ED Notes (Signed)
Sitter at bedside at this time with patient in view. Patient eating a meal at this time.

## 2014-05-30 NOTE — BH Assessment (Signed)
Clinician consulted with Heloise Purpura, NP who recommends pt to be evaluated by psych in the AM for possible discharge. Clinician provided updated to Dr. Reather Converse.   Fredonia Highland, MSW, LCSW Triage Specialist [phone]

## 2014-05-31 DIAGNOSIS — F4325 Adjustment disorder with mixed disturbance of emotions and conduct: Secondary | ICD-10-CM

## 2014-05-31 LAB — CBG MONITORING, ED
Glucose-Capillary: 195 mg/dL — ABNORMAL HIGH (ref 70–99)
Glucose-Capillary: 74 mg/dL (ref 70–99)
Glucose-Capillary: 82 mg/dL (ref 70–99)
Glucose-Capillary: 87 mg/dL (ref 70–99)

## 2014-05-31 LAB — RAPID URINE DRUG SCREEN, HOSP PERFORMED
Amphetamines: NOT DETECTED
Barbiturates: NOT DETECTED
Benzodiazepines: NOT DETECTED
Cocaine: NOT DETECTED
Opiates: NOT DETECTED
Tetrahydrocannabinol: NOT DETECTED
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
## 2014-05-31 NOTE — Discharge Instructions (Signed)

## 2014-05-31 NOTE — ED Provider Notes (Addendum)
Pt is awaiting psych disposition.  Sleeping this am.  Psych PA assessment last night felt that patient could be treated as an outpatient.  Psych md to review today.  Filed Vitals:   05/30/14 2148  BP: 159/74  Pulse: 88  Temp:   Resp: 18   Continue to monitor.    Dorie Rank, MD 05/31/14 [phone]  Pt was assessed by Psychiatry this morning.  Pt stable for discharge.  Outpatient follow up.  Dorie Rank, MD 05/31/14 [phone]
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
## 2014-05-31 NOTE — ED Notes (Signed)
Pt c/o nausea.  

## 2014-07-02 ENCOUNTER — Emergency Department (HOSPITAL_COMMUNITY): Payer: Medicaid Other

## 2014-07-02 ENCOUNTER — Encounter (HOSPITAL_COMMUNITY): Payer: Self-pay | Admitting: Emergency Medicine

## 2014-07-02 ENCOUNTER — Emergency Department (HOSPITAL_COMMUNITY)
Admission: EM | Admit: 2014-07-02 | Discharge: 2014-07-02 | Disposition: A | Discharge status: Home / Self Care | Payer: Medicaid Other | Attending: Emergency Medicine | Admitting: Emergency Medicine

## 2014-07-02 DIAGNOSIS — Z79899 Other long term (current) drug therapy: Secondary | ICD-10-CM | POA: Insufficient documentation

## 2014-07-02 DIAGNOSIS — I10 Essential (primary) hypertension: Secondary | ICD-10-CM | POA: Insufficient documentation

## 2014-07-02 DIAGNOSIS — F319 Bipolar disorder, unspecified: Secondary | ICD-10-CM | POA: Insufficient documentation

## 2014-07-02 DIAGNOSIS — E119 Type 2 diabetes mellitus without complications: Secondary | ICD-10-CM | POA: Diagnosis not present

## 2014-07-02 DIAGNOSIS — Z794 Long term (current) use of insulin: Secondary | ICD-10-CM | POA: Diagnosis not present

## 2014-07-02 DIAGNOSIS — R5383 Other fatigue: Secondary | ICD-10-CM | POA: Diagnosis not present

## 2014-07-02 DIAGNOSIS — R062 Wheezing: Secondary | ICD-10-CM | POA: Diagnosis not present

## 2014-07-02 DIAGNOSIS — E785 Hyperlipidemia, unspecified: Secondary | ICD-10-CM | POA: Diagnosis not present

## 2014-07-02 DIAGNOSIS — R5381 Other malaise: Secondary | ICD-10-CM | POA: Diagnosis not present

## 2014-07-02 DIAGNOSIS — Z7982 Long term (current) use of aspirin: Secondary | ICD-10-CM | POA: Diagnosis not present

## 2014-07-02 DIAGNOSIS — E039 Hypothyroidism, unspecified: Secondary | ICD-10-CM | POA: Insufficient documentation

## 2014-07-02 DIAGNOSIS — Z860100 Personal history of colon polyps, unspecified: Secondary | ICD-10-CM | POA: Insufficient documentation

## 2014-07-02 DIAGNOSIS — F4310 Post-traumatic stress disorder, unspecified: Secondary | ICD-10-CM | POA: Diagnosis not present

## 2014-07-02 DIAGNOSIS — F17200 Nicotine dependence, unspecified, uncomplicated: Secondary | ICD-10-CM | POA: Insufficient documentation

## 2014-07-02 DIAGNOSIS — Z8719 Personal history of other diseases of the digestive system: Secondary | ICD-10-CM | POA: Insufficient documentation

## 2014-07-02 DIAGNOSIS — R531 Weakness: Secondary | ICD-10-CM

## 2014-07-02 LAB — CBC WITH DIFFERENTIAL/PLATELET
Basophils Absolute: 0 10*3/uL (ref 0.0–0.1)
Basophils Relative: 0 % (ref 0–1)
Eosinophils Absolute: 0.1 10*3/uL (ref 0.0–0.7)
Eosinophils Relative: 1 % (ref 0–5)
HCT: 45.7 % (ref 36.0–46.0)
Hemoglobin: 16 g/dL — ABNORMAL HIGH (ref 12.0–15.0)
Lymphocytes Relative: 26 % (ref 12–46)
Lymphs Abs: 2.8 10*3/uL (ref 0.7–4.0)
MCH: 31.5 pg (ref 26.0–34.0)
MCHC: 35 g/dL (ref 30.0–36.0)
MCV: 90 fL (ref 78.0–100.0)
Monocytes Absolute: 0.6 10*3/uL (ref 0.1–1.0)
Monocytes Relative: 6 % (ref 3–12)
Neutro Abs: 7.1 10*3/uL (ref 1.7–7.7)
Neutrophils Relative %: 67 % (ref 43–77)
Platelets: 188 10*3/uL (ref 150–400)
RBC: 5.08 MIL/uL (ref 3.87–5.11)
RDW: 14.2 % (ref 11.5–15.5)
WBC: 10.7 10*3/uL — ABNORMAL HIGH (ref 4.0–10.5)

## 2014-07-02 LAB — COMPREHENSIVE METABOLIC PANEL
ALT: 31 U/L (ref 0–35)
AST: 39 U/L — ABNORMAL HIGH (ref 0–37)
Albumin: 3.8 g/dL (ref 3.5–5.2)
Alkaline Phosphatase: 113 U/L (ref 39–117)
Anion gap: 14 (ref 5–15)
BUN: 15 mg/dL (ref 6–23)
CO2: 27 mEq/L (ref 19–32)
Calcium: 9.6 mg/dL (ref 8.4–10.5)
Chloride: 99 mEq/L (ref 96–112)
Creatinine, Ser: 1.08 mg/dL (ref 0.50–1.10)
GFR calc Af Amer: 63 mL/min — ABNORMAL LOW (ref >90)
GFR calc non Af Amer: 54 mL/min — ABNORMAL LOW (ref >90)
Glucose, Bld: 176 mg/dL — ABNORMAL HIGH (ref 70–99)
Potassium: 3.5 mEq/L — ABNORMAL LOW (ref 3.7–5.3)
Sodium: 140 mEq/L (ref 137–147)
Total Bilirubin: 0.6 mg/dL (ref 0.3–1.2)
Total Protein: 6.8 g/dL (ref 6.0–8.3)

## 2014-07-02 LAB — TROPONIN I: Troponin I: <0.3 ng/mL (ref <0.30)

## 2014-07-02 IMAGING — CR DG CHEST 2V
2 series · 2 of 2 positions shown · non-contrast
Comparison: [DATE].

CLINICAL DATA: Weakness and fatigue.  Smoker.

EXAM:
CHEST  2 VIEW

[view not recorded (1 of 2)]
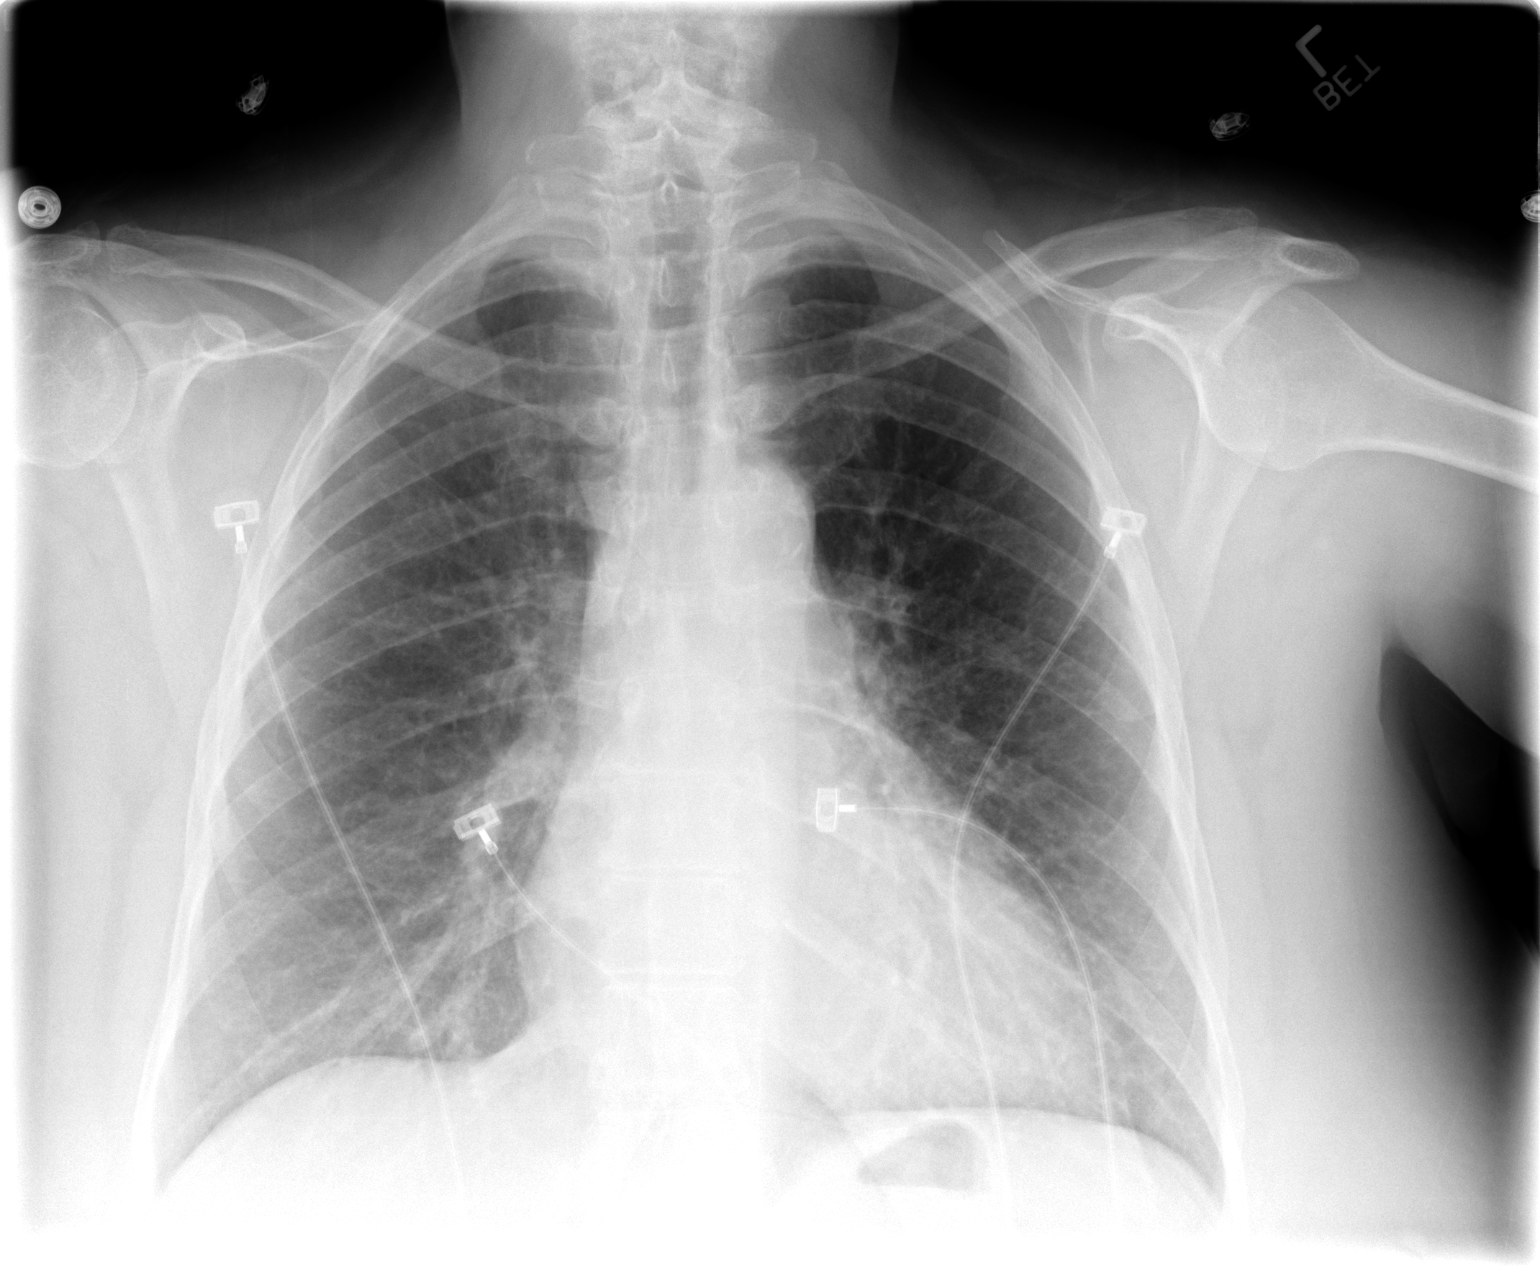

[view not recorded (2 of 2)]
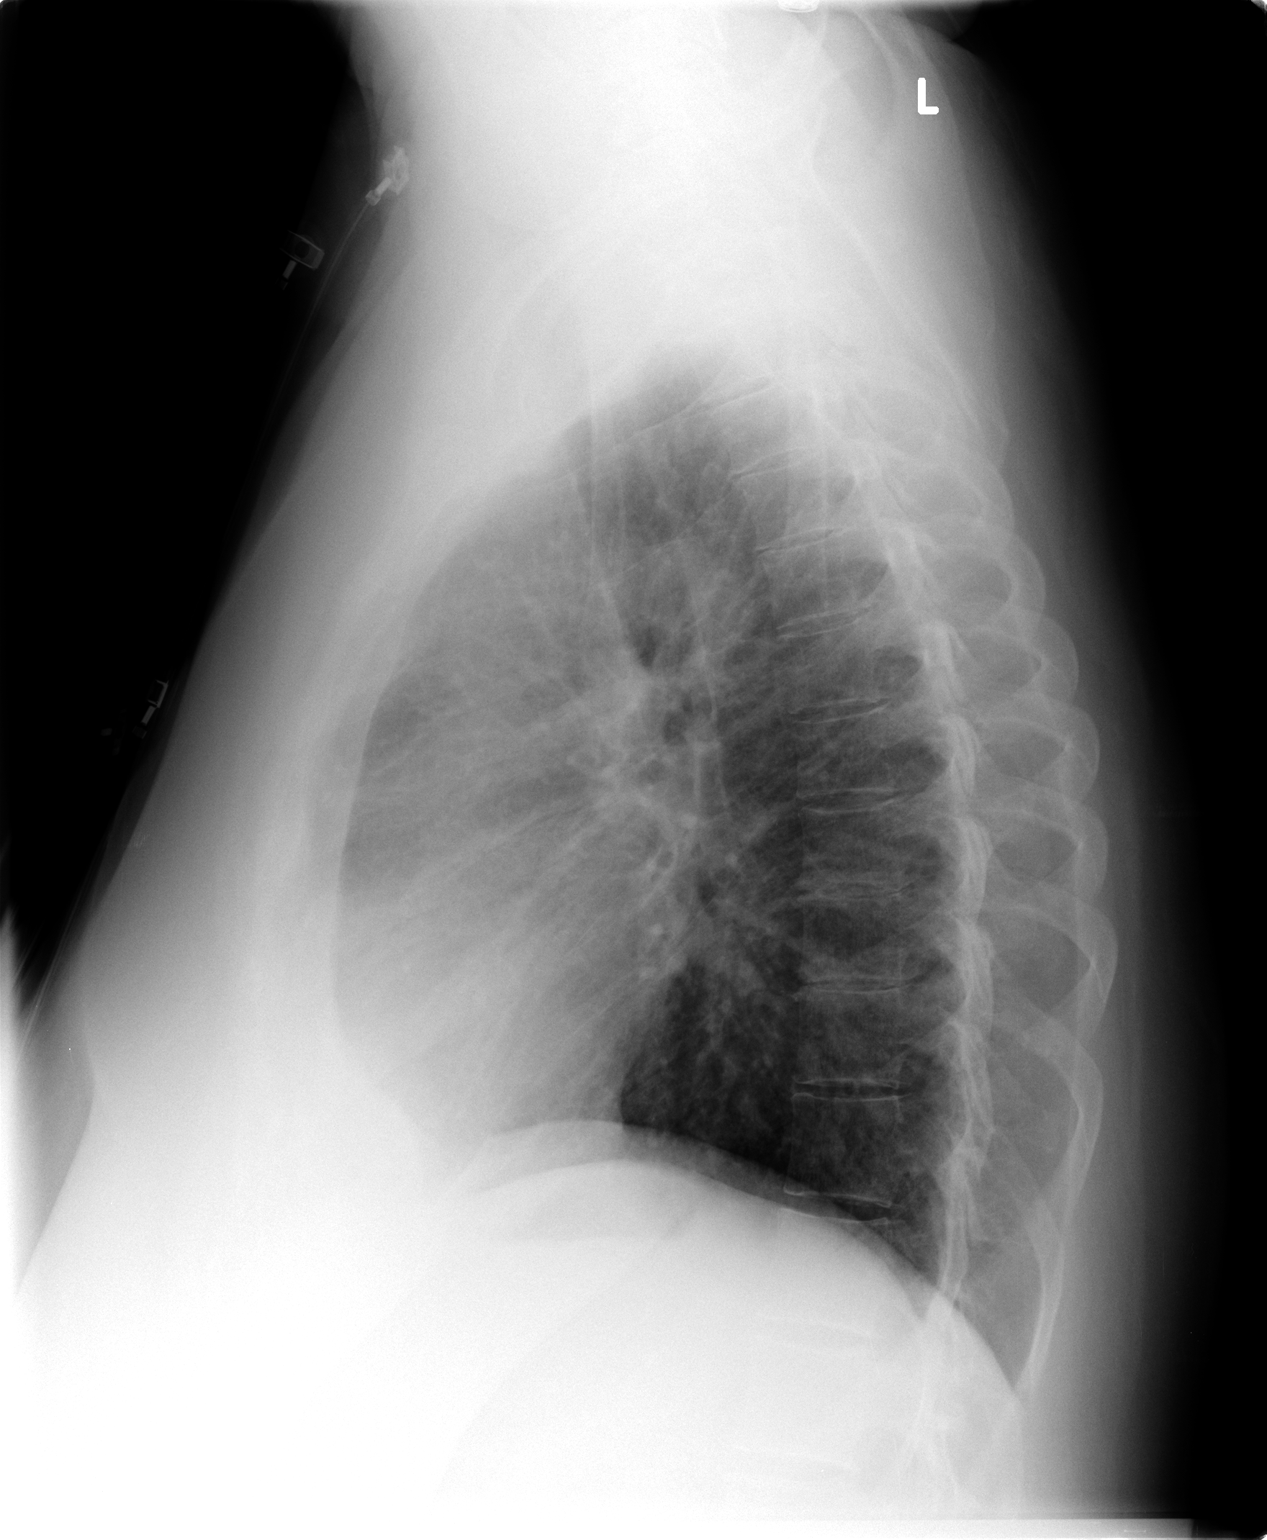

[2 of 2 positions shown; findings below may reference images not displayed]

FINDINGS: Borderline enlarged cardiac silhouette with an interval decrease in
size. Mildly prominent pulmonary vasculature and interstitial
markings. Mild diffuse peribronchial thickening. Minimal thoracic
spine degenerative changes.
IMPRESSION: 1. No acute abnormality.
2. Borderline cardiomegaly and mild chronic bronchitic changes

## 2014-07-02 NOTE — ED Provider Notes (Signed)
CSN: [id number]     Arrival date & time 07/02/14  1854 History  This chart was scribed for Maudry Diego, MD by Lowella Petties, ED Scribe. The patient was seen in room [facility]19/[facility]. Patient's care was started at 8:05 PM.   Chief Complaint  Patient presents with  . Thyroid Problem   Patient is a 63 y.o. female presenting with thyroid problem. The history is provided by the patient. No language interpreter was used.  Thyroid Problem This is a new problem. The current episode started 3 to 5 hours ago. The problem occurs constantly. The problem has been gradually worsening. Pertinent negatives include no chest pain, no abdominal pain and no headaches. Nothing aggravates the symptoms. Nothing relieves the symptoms. Treatments tried: Synthroid. The treatment provided no relief.   HPI Comments: Claudia Keller is a 63 y.o. female who presents to the Emergency Department complaining of generalized weakness. She rports that she saw her doctor 4 days ago about her BP, and that he called her today to tell her that her thyroid is "out of whack" and that she should come in tomorrow. She smokes 3 PPD.   Past Medical History  Diagnosis Date  . Essential hypertension, benign   . Type 2 diabetes mellitus   . Hypothyroidism   . Mixed hyperlipidemia   . Bipolar disorder   . Hx of colonic polyps   . Stress incontinence, female   . S/P colonoscopy Jan 2011    RMR: pancolonic diverticula, polypoid rectal mucosa with  prominent lymphoid aggregates, repeat in Jan 2016   . Irritable bowel syndrome   . PTSD (post-traumatic stress disorder)    Past Surgical History  Procedure Laterality Date  . Bilateral tubal ligation    . Colonoscopy  11/17/2009    normal rectum/  . Esophagogastroduodenoscopy  11/23/2011    Procedure: ESOPHAGOGASTRODUODENOSCOPY (EGD);  Surgeon: Daneil Dolin, MD;  Location: [facility];  Service: Endoscopy;  Laterality: N/A;  9:00  . Maloney dilation  11/23/2011    Procedure: Venia Minks  DILATION;  Surgeon: Daneil Dolin, MD;  Location: [facility];  Service: Endoscopy;  Laterality: N/A;  . Cholecystectomy  09/11/2012    Procedure: LAPAROSCOPIC CHOLECYSTECTOMY;  Surgeon: Jamesetta So, MD;  Location: [facility] ORS;  Service: General;  Laterality: N/A;   Family History  Problem Relation Age of Onset  . Coronary artery disease    . Diabetes type II    . Schizophrenia Brother   . Stroke Brother   . Cancer Mother     unsure what type  . Heart disease Father   . Heart attack Father   . Colon cancer Neg Hx    History  Substance Use Topics  . Smoking status: Current Every Day Smoker -- 2.00 packs/day for 31 years    Types: Cigarettes  . Smokeless tobacco: Never Used     Comment: since age 85  . Alcohol Use: No   OB History   Grav Para Term Preterm Abortions TAB SAB Ect Mult Living                 Review of Systems  Constitutional: Positive for fatigue. Negative for appetite change.  HENT: Negative for congestion, ear discharge and sinus pressure.   Eyes: Negative for discharge.  Respiratory: Negative for cough.   Cardiovascular: Negative for chest pain.  Gastrointestinal: Negative for abdominal pain and diarrhea.  Genitourinary: Negative for frequency and hematuria.  Musculoskeletal: Negative for back pain.  Skin:  Negative for rash.  Neurological: Negative for seizures and headaches.  Psychiatric/Behavioral: Negative for hallucinations.   Allergies  Review of patient's allergies indicates no known allergies.  Home Medications   Prior to Admission medications   Medication Sig Start Date End Date Taking? Authorizing Provider  albuterol (PROVENTIL HFA;VENTOLIN HFA) 108 (90 BASE) MCG/ACT inhaler Inhale 2 puffs into the lungs every 6 (six) hours as needed. For shortness of breath     Historical Provider, MD  aspirin 81 MG EC tablet Take 81 mg by mouth daily.      Historical Provider, MD  cetirizine (ZYRTEC) 10 MG tablet Take 10 mg by mouth daily.     Historical  Provider, MD  clonazePAM (KLONOPIN) 1 MG tablet Take 1 mg by mouth at bedtime as needed for anxiety.    Historical Provider, MD  furosemide (LASIX) 20 MG tablet Take 20 mg by mouth daily.     Historical Provider, MD  gabapentin (NEURONTIN) 100 MG capsule Take 100 mg by mouth 3 (three) times daily.     Historical Provider, MD  insulin glargine (LANTUS) 100 UNIT/ML injection Inject 80 Units into the skin at bedtime as needed. If needed for blood sugar control.    Historical Provider, MD  insulin lispro (HUMALOG) 100 UNIT/ML injection Inject 10-30 Units into the skin 3 (three) times daily as needed for high blood sugar (Uses according to a sliding scale at home).    Historical Provider, MD  levothyroxine (SYNTHROID, LEVOTHROID) 88 MCG tablet Take 88 mcg by mouth daily before breakfast.    Historical Provider, MD  linagliptin (TRADJENTA) 5 MG TABS tablet Take 5 mg by mouth daily.    Historical Provider, MD  lisinopril (PRINIVIL,ZESTRIL) 20 MG tablet Take 20 mg by mouth daily.      Historical Provider, MD  PARoxetine (PAXIL) 40 MG tablet Take 40 mg by mouth daily.    Historical Provider, MD  simvastatin (ZOCOR) 40 MG tablet Take 40 mg by mouth every evening.    Historical Provider, MD  traZODone (DESYREL) 50 MG tablet Take 50 mg by mouth at bedtime.    Historical Provider, MD   Triage Vitals: BP 143/91  Pulse 98  Temp(Src) 98.2 F (36.8 C) (Oral)  Resp 20  Ht 5\' 5"  (1.651 m)  Wt 240 lb (108.863 kg)  BMI 39.94 kg/m2  SpO2 96% Physical Exam  Constitutional: She is oriented to person, place, and time. She appears well-developed.  HENT:  Head: Normocephalic.  Eyes: Conjunctivae and EOM are normal. No scleral icterus.  Neck: Neck supple. No thyromegaly present.  Cardiovascular: Normal rate and regular rhythm.  Exam reveals no gallop and no friction rub.   No murmur heard. Pulmonary/Chest: No stridor. She has wheezes (moderate, bilaterally). She has no rales. She exhibits no tenderness.   Abdominal: She exhibits no distension. There is no tenderness. There is no rebound.  Musculoskeletal: Normal range of motion. She exhibits no edema.  Lymphadenopathy:    She has no cervical adenopathy.  Neurological: She is oriented to person, place, and time. She exhibits normal muscle tone. Coordination normal.  Skin: No rash noted. No erythema.  Psychiatric: She has a normal mood and affect. Her behavior is normal.    ED Course  Procedures (including critical care time) DIAGNOSTIC STUDIES: Oxygen Saturation is 96% on room air, normal by my interpretation.    COORDINATION OF CARE: 8:10 PM-Discussed treatment plan which includes blood work with pt at bedside and pt agreed to plan.   Labs  Review Labs Reviewed  CBC WITH DIFFERENTIAL  COMPREHENSIVE METABOLIC PANEL  TROPONIN I    Imaging Review No results found.   EKG Interpretation None      MDM   Final diagnoses:  None   Weakness,  Nl studies.  Pt to speak with md tomorrow about abnormal thyroid  The chart was scribed for me under my direct supervision.  I personally performed the history, physical, and medical decision making and all procedures in the evaluation of this patient.Maudry Diego, MD 07/02/14 [phone]

## 2014-07-02 NOTE — Discharge Instructions (Signed)
Follow up with your md tomorrow to discuss your thyroid test as planned

## 2014-07-02 NOTE — ED Notes (Signed)
Pt reports she was told by her PCP that her "thyroid is out of wack" and that they will follow-up with her tomorrow. Pt states she can't wait until tomorrow because she "don't feel right". Pt has difficulty expressing what doesn't "feel right" stating "they're the doctors". Pt reports feeling achy.

## 2014-07-02 NOTE — ED Notes (Signed)
Pt. Reports getting a phone call from her Dr. Wynelle Beckmann her that her Thyroid is "not right" Pt. Reports not feeling well since this morning but will not elaborate. When asked what brought her to the ED tonight she states "my thyroid is not right and my blood pressure has been messed up for months."
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
## 2014-07-03 ENCOUNTER — Other Ambulatory Visit (HOSPITAL_COMMUNITY): Payer: Self-pay | Admitting: Internal Medicine

## 2014-07-03 DIAGNOSIS — Z139 Encounter for screening, unspecified: Secondary | ICD-10-CM

## 2014-07-10 ENCOUNTER — Ambulatory Visit (HOSPITAL_COMMUNITY): Payer: Self-pay

## 2014-07-17 ENCOUNTER — Ambulatory Visit (HOSPITAL_COMMUNITY)
Admission: RE | Admit: 2014-07-17 | Discharge: 2014-07-17 | Disposition: A | Discharge status: Home / Self Care | Payer: Medicaid Other | Source: Ambulatory Visit | Attending: Internal Medicine | Admitting: Internal Medicine

## 2014-07-17 DIAGNOSIS — Z1231 Encounter for screening mammogram for malignant neoplasm of breast: Secondary | ICD-10-CM | POA: Insufficient documentation

## 2014-07-17 DIAGNOSIS — R928 Other abnormal and inconclusive findings on diagnostic imaging of breast: Secondary | ICD-10-CM | POA: Insufficient documentation

## 2014-07-17 DIAGNOSIS — Z139 Encounter for screening, unspecified: Secondary | ICD-10-CM

## 2014-07-17 IMAGING — MG MM DIGITAL SCREENING BILAT W/ CAD
5 series · 5 of 5 positions shown · non-contrast
Comparison: Previous Exam(s)

CLINICAL DATA: Screening.

EXAM:
DIGITAL SCREENING BILATERAL MAMMOGRAM WITH CAD

[L CC]
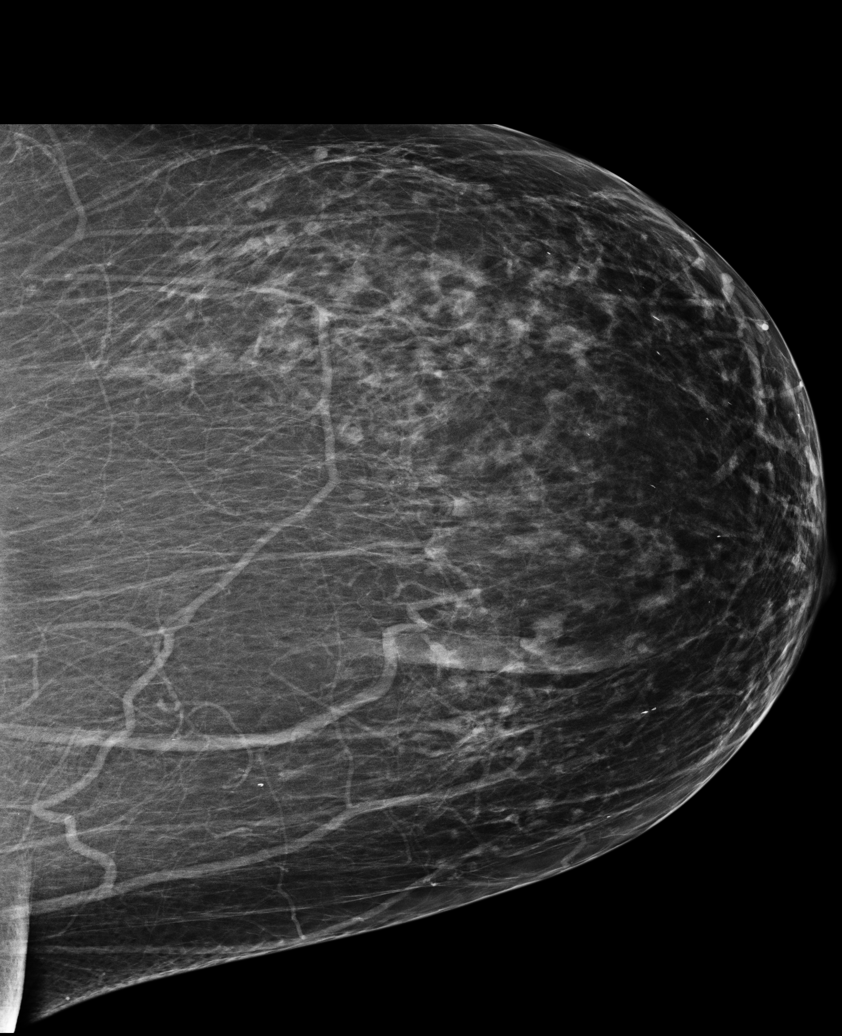

[L MLO (1 of 2)]
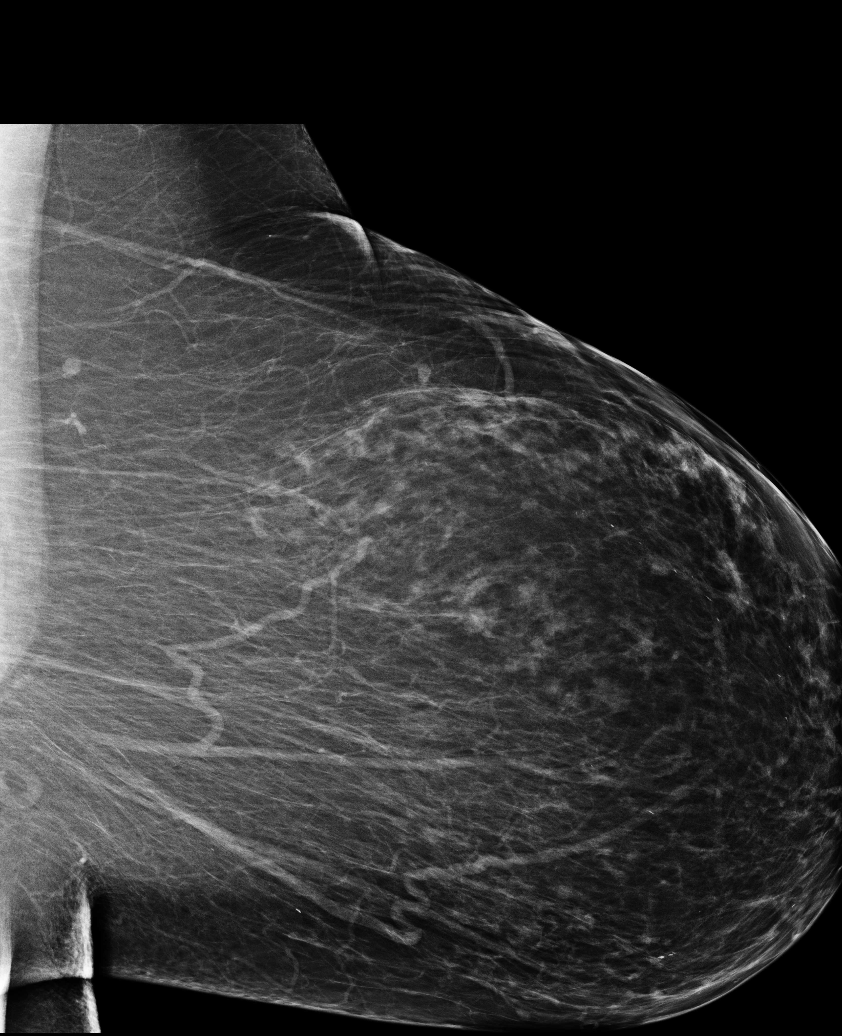

[R CC]
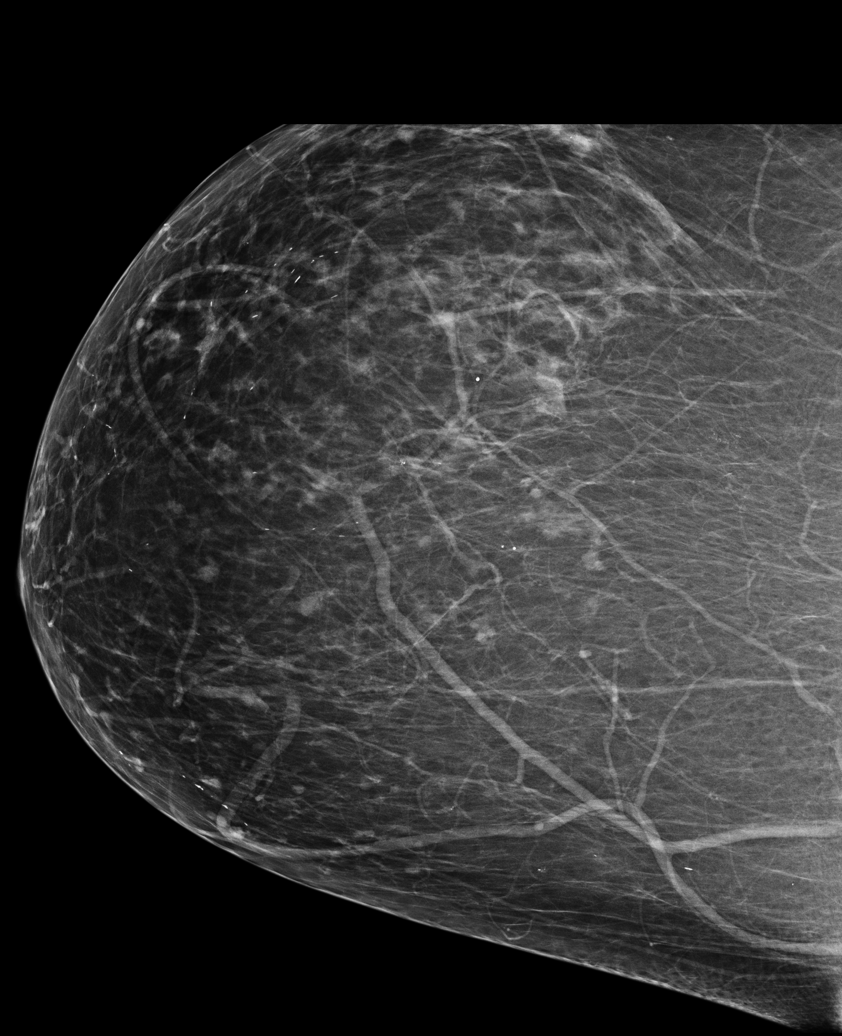

[R MLO]
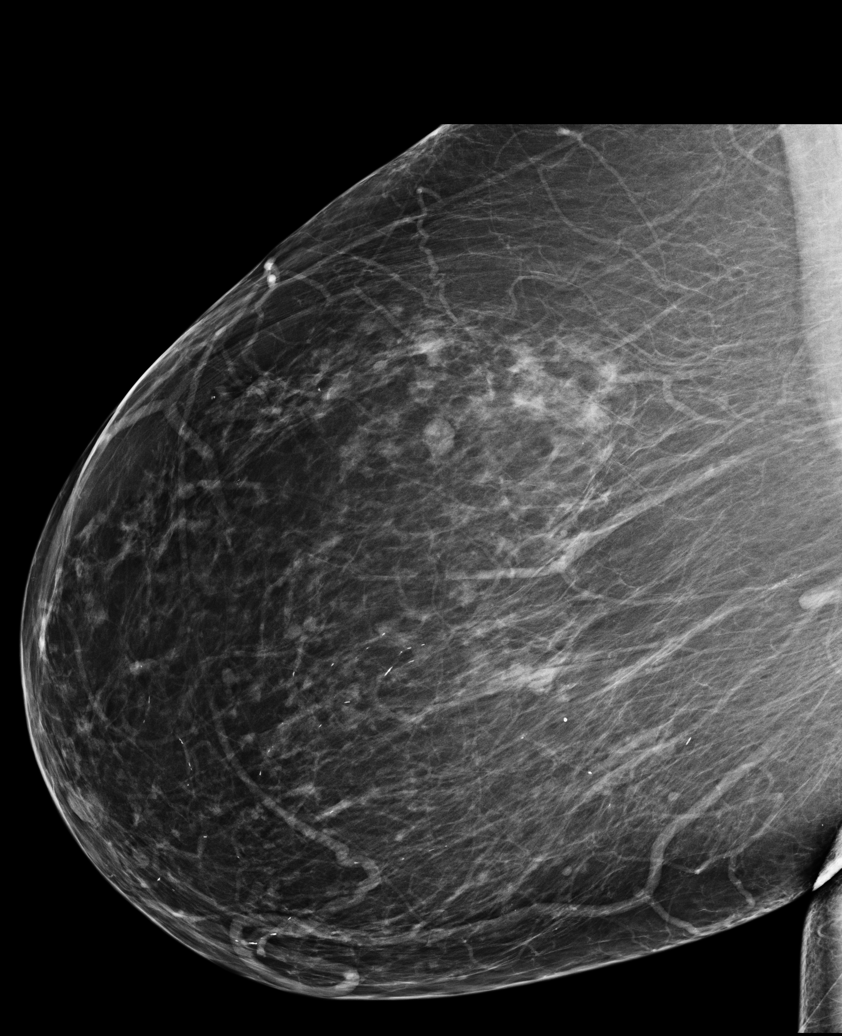

[L MLO (2 of 2)]
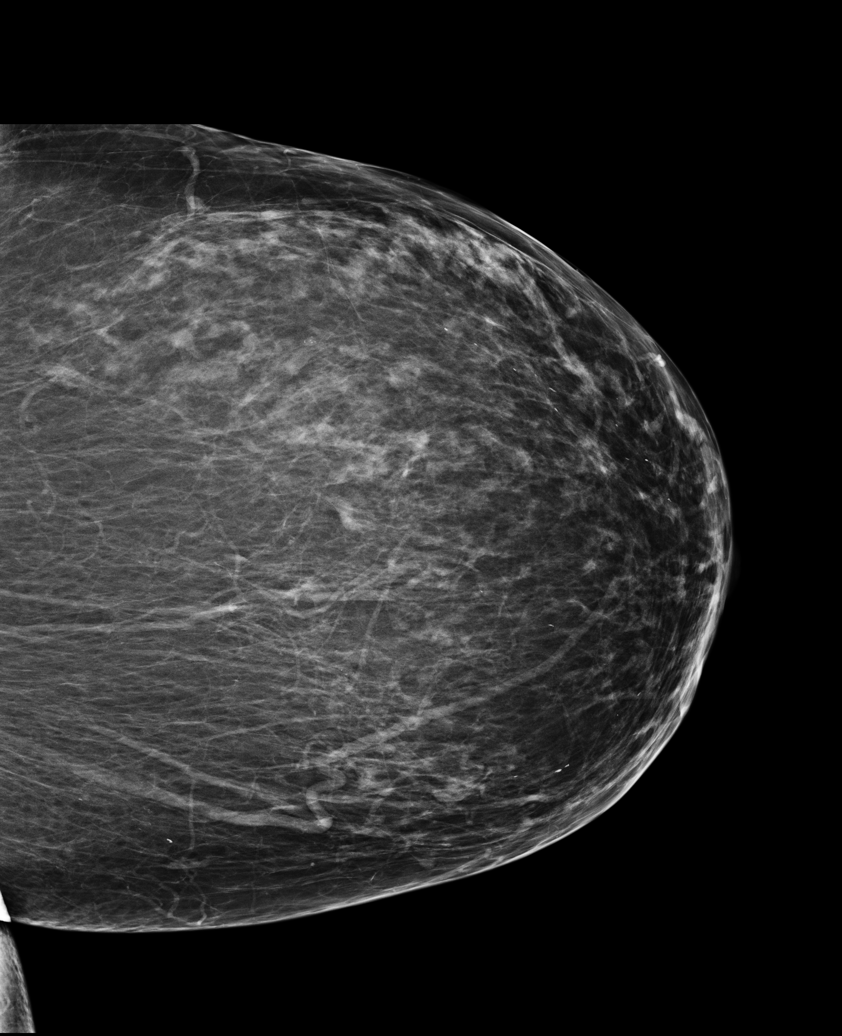

[5 of 5 positions shown; findings below may reference images not displayed]

ACR Breast Density Category b: There are scattered areas of
fibroglandular density.
FINDINGS: In the left breast calcifications warrants further imaging
evaluation with calcifications. In the right breast, calcifications
warrants further imaging evaluation with magnified views. Images
were processed with CAD.
IMPRESSION: Further imaging evaluation is suggested for calcifications in the
left breast.

Further imaging evaluation is suggested for calcifications in the
right breast.

RECOMMENDATION:
Diagnostic mammogram and possibly ultrasound of both breasts.
(Code:[X0])

The patient will be contacted regarding the findings, and additional
imaging will be scheduled.

BI-RADS CATEGORY  0: Incomplete. Need additional imaging evaluation
and/or prior mammograms for comparison.

## 2014-07-19 ENCOUNTER — Other Ambulatory Visit: Payer: Self-pay | Admitting: Internal Medicine

## 2014-07-19 DIAGNOSIS — R928 Other abnormal and inconclusive findings on diagnostic imaging of breast: Secondary | ICD-10-CM

## 2014-08-13 ENCOUNTER — Encounter (HOSPITAL_COMMUNITY): Payer: Medicaid Other

## 2014-08-27 ENCOUNTER — Other Ambulatory Visit: Payer: Self-pay | Admitting: Internal Medicine

## 2014-08-27 ENCOUNTER — Ambulatory Visit (HOSPITAL_COMMUNITY)
Admission: RE | Admit: 2014-08-27 | Discharge: 2014-08-27 | Disposition: A | Discharge status: Home / Self Care | Payer: Medicaid Other | Source: Ambulatory Visit | Attending: Internal Medicine | Admitting: Internal Medicine

## 2014-08-27 DIAGNOSIS — R928 Other abnormal and inconclusive findings on diagnostic imaging of breast: Secondary | ICD-10-CM

## 2014-08-27 IMAGING — MG MM DIGITAL DIAGNOSTIC BILAT CAD
8 of 13 series · 8 of 13 positions shown · non-contrast
Comparison: Priors

CLINICAL DATA: Patient recalled from screening for bilateral breast
calcifications.

EXAM:
DIGITAL DIAGNOSTIC  BILATERAL MAMMOGRAM

[L CC (1 of 2)]
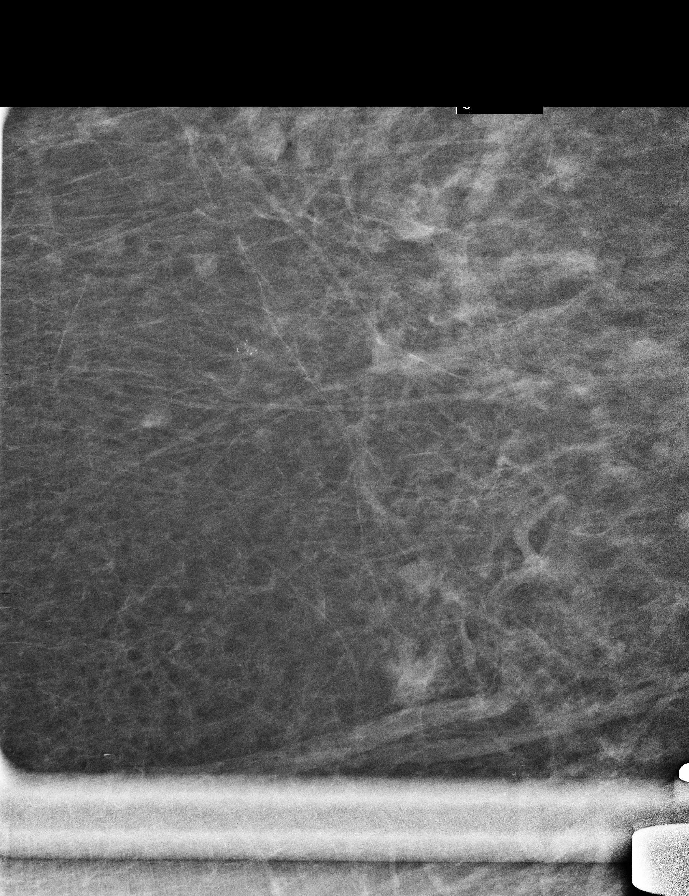

[R CC]
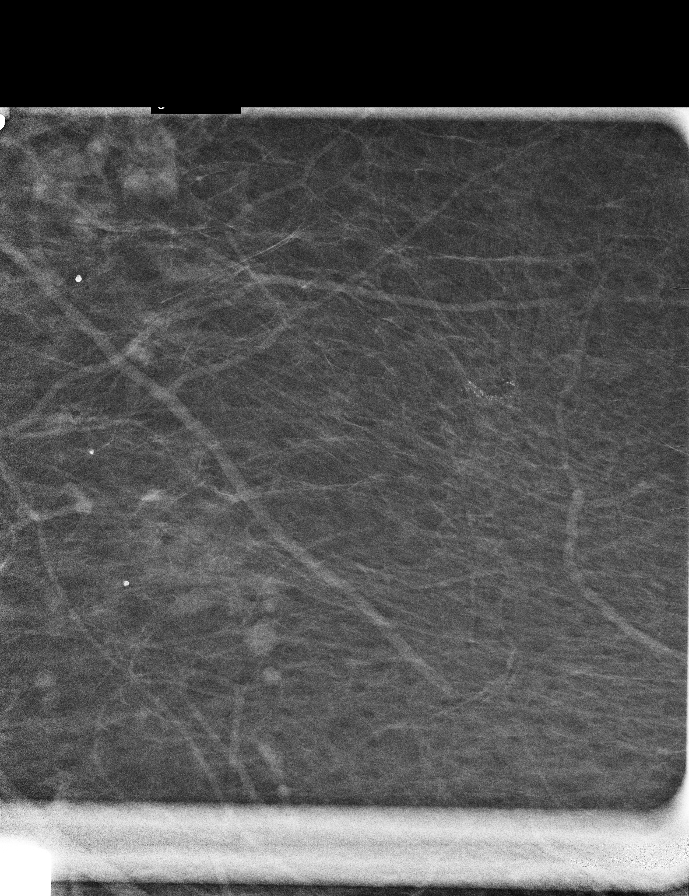

[L ML (1 of 4)]
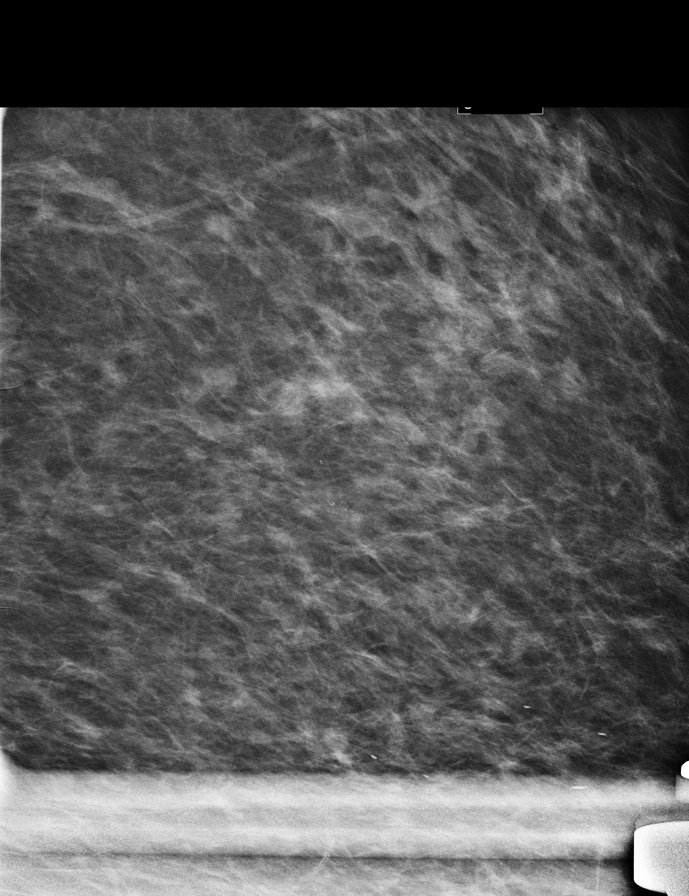

[R ML]
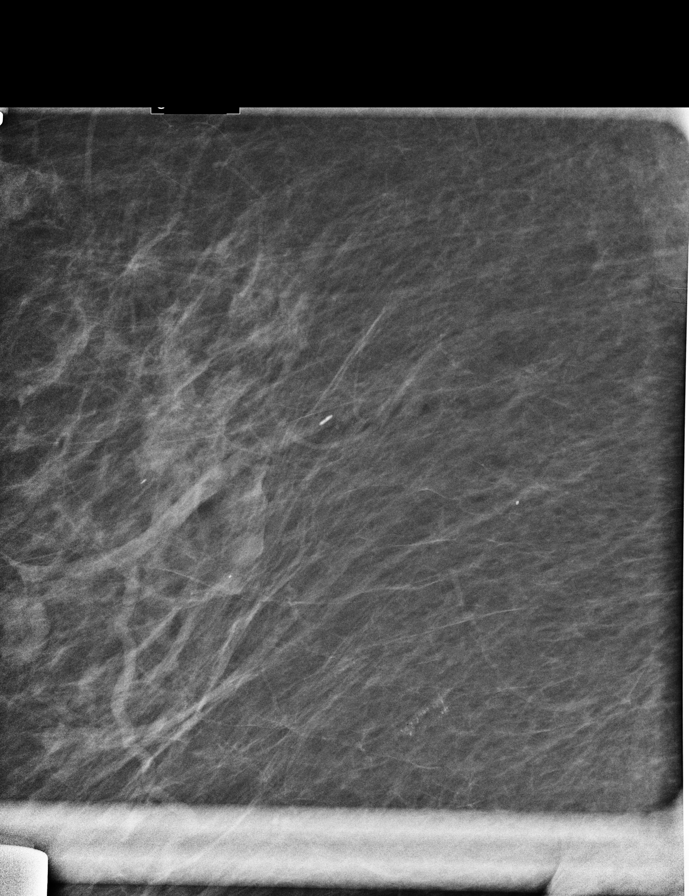

[L CC (2 of 2)]
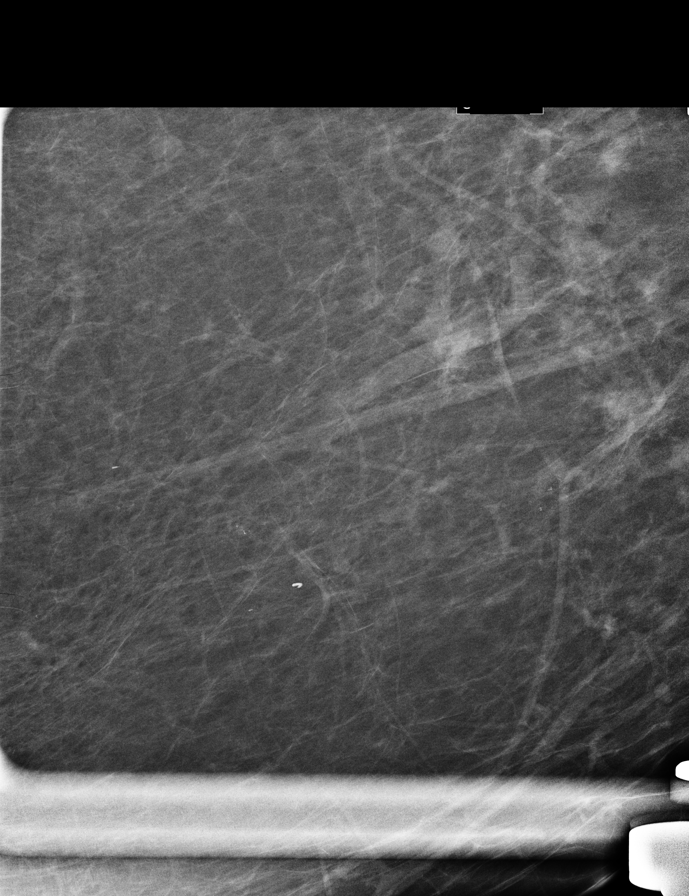

[L ML (2 of 4)]
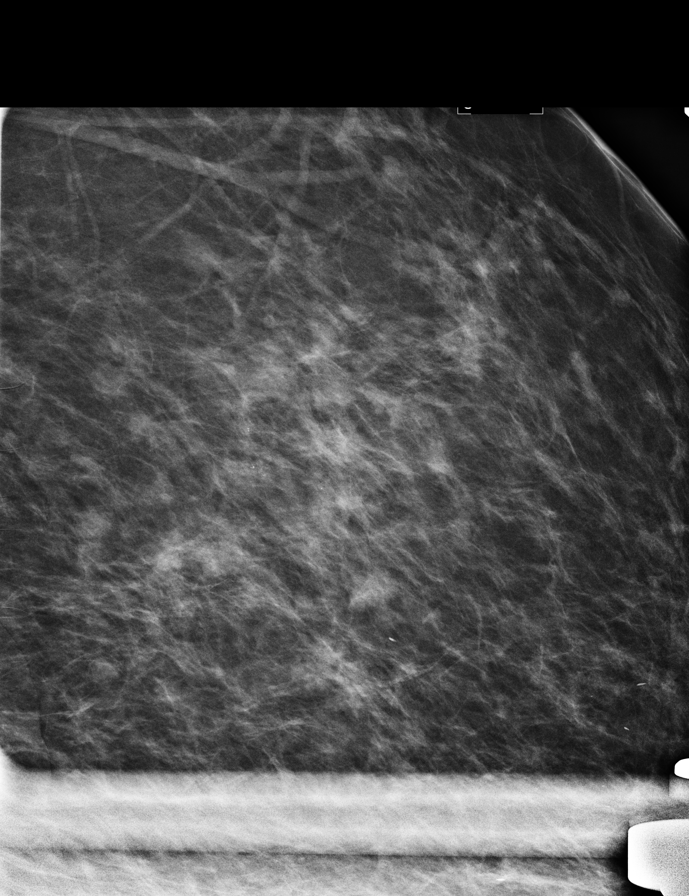

[L ML (3 of 4)]
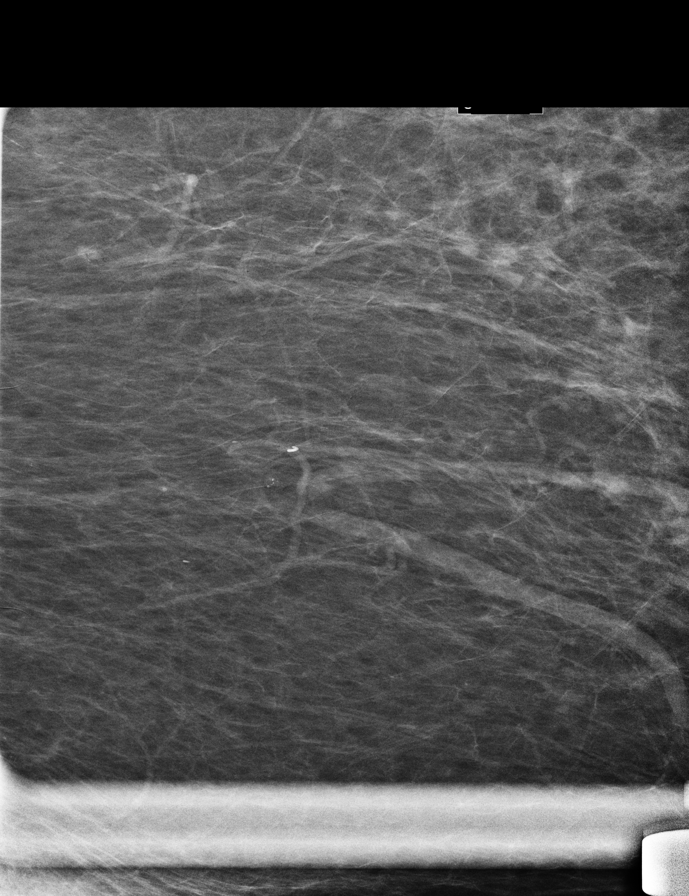

[L ML (4 of 4)]
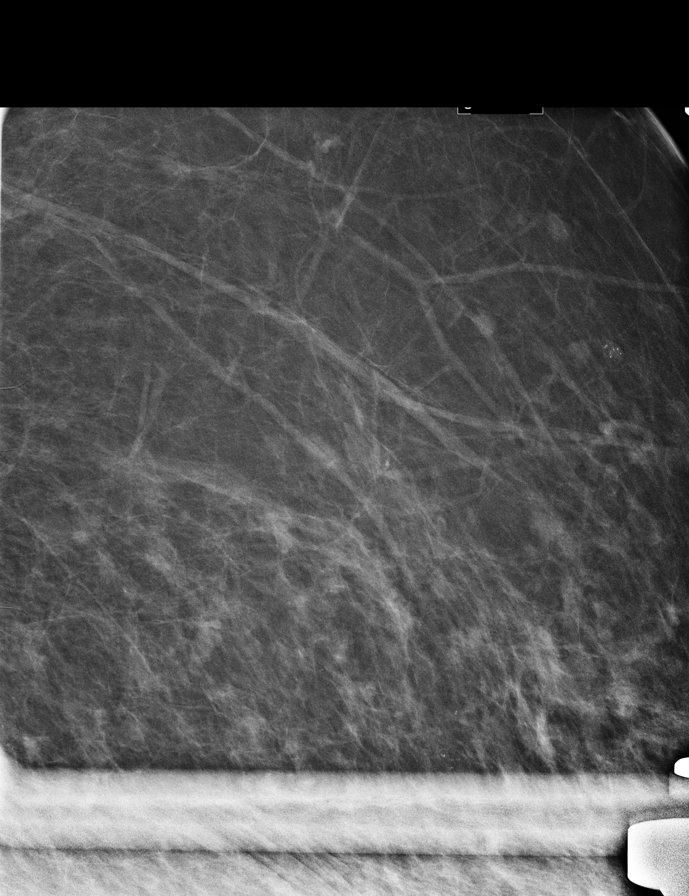

[8 of 13 positions shown; findings below may reference images not displayed]

ACR Breast Density Category b: There are scattered areas of
fibroglandular density.
FINDINGS: Spot compression magnification CC and true lateral views of the
right breast demonstrate a 9 x 4 x 4 mm group of amorphous
calcifications within the 7 o'clock position right breast posterior
depth.

Spot compression magnification CC and true lateral views of the left
breast demonstrate 3 separate groups of calcifications.

Group 1: 3 x 3 x 3 mm group of coarse heterogeneous calcifications
within the 1 o'clock position left breast middle depth.

Group 2: 2 x 2 x 2 mm group of coarse heterogeneous calcifications
within the lower inner quadrant left breast posterior depth.

Group 3: 8 x 6 x 3 mm group of coarse heterogeneous calcifications
within the upper inner quadrant left breast middle depth.
IMPRESSION: Suspicious bilateral breast calcifications.

RECOMMENDATION:
Stereotactic guided core needle biopsy of the right breast
calcifications and group 1 of the left breast calcifications is
recommended. If benign pathology, six-month followup mammography can
be performed to demonstrate stability of the additional groups of
calcifications within the left breast (group 2 and group 3).

I have discussed the findings and recommendations with the patient.
Results were also provided in writing at the conclusion of the
visit. If applicable, a reminder letter will be sent to the patient
regarding the next appointment.

BI-RADS CATEGORY  4: Suspicious.

## 2014-09-04 ENCOUNTER — Ambulatory Visit
Admission: RE | Admit: 2014-09-04 | Discharge: 2014-09-04 | Disposition: A | Discharge status: Home / Self Care | Payer: Medicaid Other | Source: Ambulatory Visit | Attending: Internal Medicine | Admitting: Internal Medicine

## 2014-09-04 DIAGNOSIS — R928 Other abnormal and inconclusive findings on diagnostic imaging of breast: Secondary | ICD-10-CM

## 2014-09-19 ENCOUNTER — Other Ambulatory Visit (HOSPITAL_COMMUNITY): Payer: Self-pay | Admitting: General Surgery

## 2014-09-19 DIAGNOSIS — N6320 Unspecified lump in the left breast, unspecified quadrant: Secondary | ICD-10-CM

## 2014-09-20 NOTE — H&P (Signed)
  NTS SOAP Note  Vital Signs:  Vitals as of: 12/75/1700: Systolic 174: Diastolic 94: Heart Rate 86: Temp 98.54F: Height 65ft 5in: Weight 223Lbs 0 Ounces: BMI 37.11  BMI : 37.11 kg/m2  Subjective: This 63 year old female presents for of a left breast mass.  Seen on mammography,  biopsy shows atypical cells.  Referred for breast biopsy.  Does not feel the lump.  Review of Symptoms:  Constitutional:fatigue Head:unremarkable Eyes:unremarkable   sinus problems Cardiovascular:  unremarkable Respiratory:dyspnea, cough Gastrointestinabdominal pain, nausea Genitourinary:frequency Musculoskeletal:unremarkable Skin:unremarkable Hematolgic/Lymphatic:unremarkable   Allergic/Immunologic:unremarkable   Past Medical History:  Reviewed  Past Medical History  Surgical History: cholecystectomy,  BTL Medical Problems: hypothyroidism,  IDDM,  high cholesterol,  HTN Allergies: nkda Medications: gabapentin,  simvastatin,  trazadone,  amlodipine,  lasix,  faroxetine,  tradjenta,  humulog    Social History:Reviewed  Social History  Preferred Language: English Race:  White Ethnicity: Not Hispanic / Latino Age: 21 year Marital Status:  S   Smoking Status: Current every day smoker reviewed on 09/19/2014 Started Date:  Packs per day: 2.00 Functional Status reviewed on 09/19/2014 ------------------------------------------------ Bathing: Normal Cooking: Normal Dressing: Normal Driving: Normal Eating: Normal Managing Meds: Normal Oral Care: Normal Shopping: Normal Toileting: Normal Transferring: Normal Walking: Normal Cognitive Status reviewed on 09/19/2014 ------------------------------------------------ Attention: Normal Decision Making: Normal Language: Normal Memory: Normal Motor: Normal Perception: Normal Problem Solving: Normal Visual and Spatial: Normal   Family History:Reviewed  Family Health History Mother, Unknown; Lung cancer;  Father,  Living; Heart attack (myocardial infarction);     Objective Information: General:Well appearing, well nourished in no distress. Heart:RRR, no murmur or gallop.  Normal S1, S2.  No S3, S4.  Lungs:  CTA bilaterally, no wheezes, rhonchi, rales.  Breathing unlabored. No dominant mass,  nipple discharge,  dimpling in either breast.  Axillas negative. Mammogram and path reports reviewed. Assessment:Left breast neoplasm,  unspecified  Diagnoses: 238.3  B44.96 Neoplasm of uncertain behavior of breast (Neoplasm of uncertain behavior of left breast)  Procedures: 99214 - OFFICE OUTPATIENT VISIT 25 MINUTES    Plan:  Scheduled for left breast biopsy after needle localization on 10/18/14.   Patient Education:Alternative treatments to surgery were discussed with patient (and family).  Risks and benefits  of procedure were fully explained to the patient (and family) who gave informed consent. Patient/family questions were addressed.  Follow-up:Pending Surgery

## 2014-10-11 ENCOUNTER — Ambulatory Visit (HOSPITAL_COMMUNITY)
Admission: RE | Admit: 2014-10-11 | Discharge: 2014-10-11 | Disposition: A | Discharge status: Home / Self Care | Payer: Medicaid Other | Source: Ambulatory Visit | Attending: General Surgery | Admitting: General Surgery

## 2014-10-11 ENCOUNTER — Encounter (HOSPITAL_COMMUNITY): Payer: Self-pay

## 2014-10-11 ENCOUNTER — Encounter (HOSPITAL_COMMUNITY)
Admission: RE | Admit: 2014-10-11 | Discharge: 2014-10-11 | Disposition: A | Discharge status: Home / Self Care | Payer: Medicaid Other | Source: Ambulatory Visit | Attending: General Surgery | Admitting: General Surgery

## 2014-10-11 DIAGNOSIS — C50919 Malignant neoplasm of unspecified site of unspecified female breast: Secondary | ICD-10-CM | POA: Insufficient documentation

## 2014-10-11 DIAGNOSIS — F17200 Nicotine dependence, unspecified, uncomplicated: Secondary | ICD-10-CM | POA: Insufficient documentation

## 2014-10-11 DIAGNOSIS — Z01818 Encounter for other preprocedural examination: Secondary | ICD-10-CM | POA: Insufficient documentation

## 2014-10-11 HISTORY — DX: Chronic obstructive pulmonary disease, unspecified: J44.9

## 2014-10-11 HISTORY — DX: Unspecified osteoarthritis, unspecified site: M19.90

## 2014-10-11 HISTORY — DX: Reserved for inherently not codable concepts without codable children: IMO0001

## 2014-10-11 LAB — BASIC METABOLIC PANEL
Anion gap: 14 (ref 5–15)
BUN: 17 mg/dL (ref 6–23)
CO2: 26 mEq/L (ref 19–32)
Calcium: 9.6 mg/dL (ref 8.4–10.5)
Chloride: 101 mEq/L (ref 96–112)
Creatinine, Ser: 1.16 mg/dL — ABNORMAL HIGH (ref 0.50–1.10)
GFR calc Af Amer: 57 mL/min — ABNORMAL LOW (ref >90)
GFR calc non Af Amer: 49 mL/min — ABNORMAL LOW (ref >90)
Glucose, Bld: 294 mg/dL — ABNORMAL HIGH (ref 70–99)
Potassium: 4.5 mEq/L (ref 3.7–5.3)
Sodium: 141 mEq/L (ref 137–147)

## 2014-10-11 LAB — CBC WITH DIFFERENTIAL/PLATELET
Basophils Absolute: 0 10*3/uL (ref 0.0–0.1)
Basophils Relative: 1 % (ref 0–1)
Eosinophils Absolute: 0.1 10*3/uL (ref 0.0–0.7)
Eosinophils Relative: 2 % (ref 0–5)
HCT: 46 % (ref 36.0–46.0)
Hemoglobin: 16 g/dL — ABNORMAL HIGH (ref 12.0–15.0)
Lymphocytes Relative: 24 % (ref 12–46)
Lymphs Abs: 1.9 10*3/uL (ref 0.7–4.0)
MCH: 31.1 pg (ref 26.0–34.0)
MCHC: 34.8 g/dL (ref 30.0–36.0)
MCV: 89.3 fL (ref 78.0–100.0)
Monocytes Absolute: 0.6 10*3/uL (ref 0.1–1.0)
Monocytes Relative: 8 % (ref 3–12)
Neutro Abs: 5.2 10*3/uL (ref 1.7–7.7)
Neutrophils Relative %: 65 % (ref 43–77)
Platelets: 201 10*3/uL (ref 150–400)
RBC: 5.15 MIL/uL — ABNORMAL HIGH (ref 3.87–5.11)
RDW: 14.4 % (ref 11.5–15.5)
WBC: 7.9 10*3/uL (ref 4.0–10.5)

## 2014-10-11 IMAGING — CR DG CHEST 2V
2 series · 2 of 2 positions shown · non-contrast
Comparison: [DATE] .

CLINICAL DATA: Breast cancer.  Bronchitis.  Smoker.

EXAM:
CHEST  2 VIEW

[view not recorded (1 of 2)]
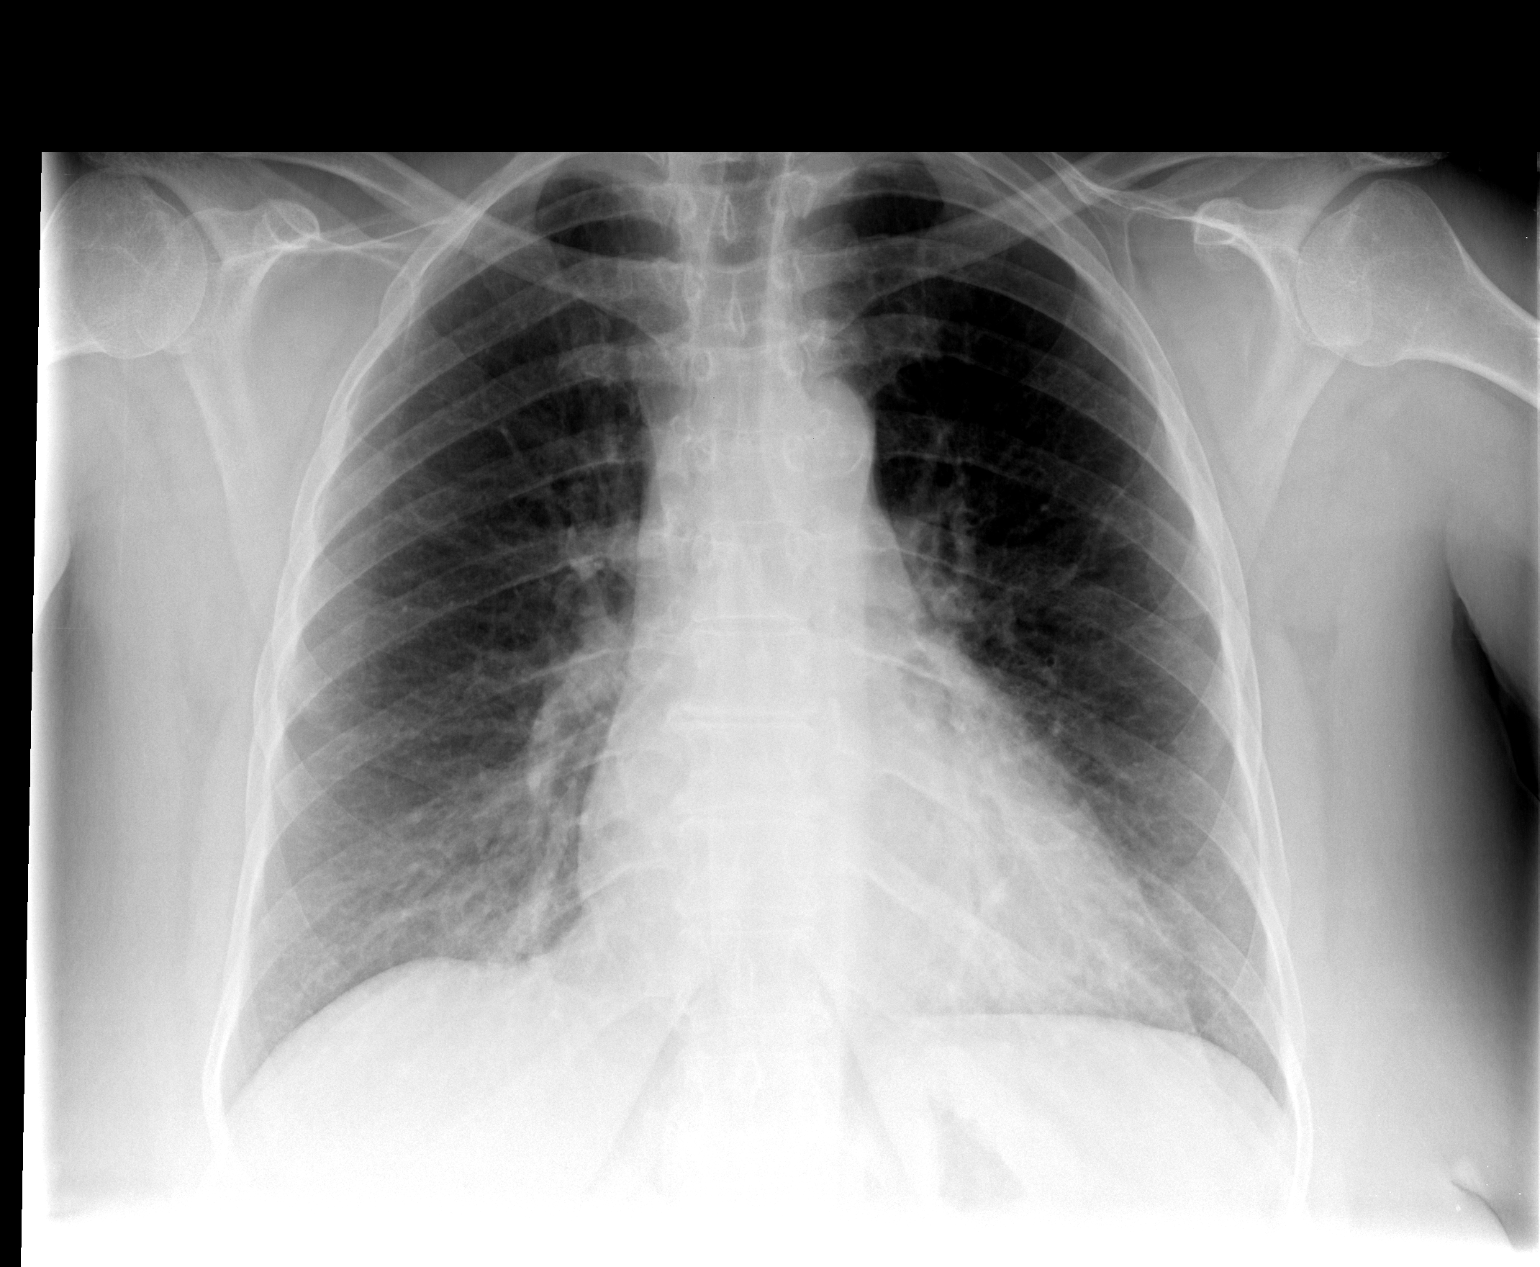

[view not recorded (2 of 2)]
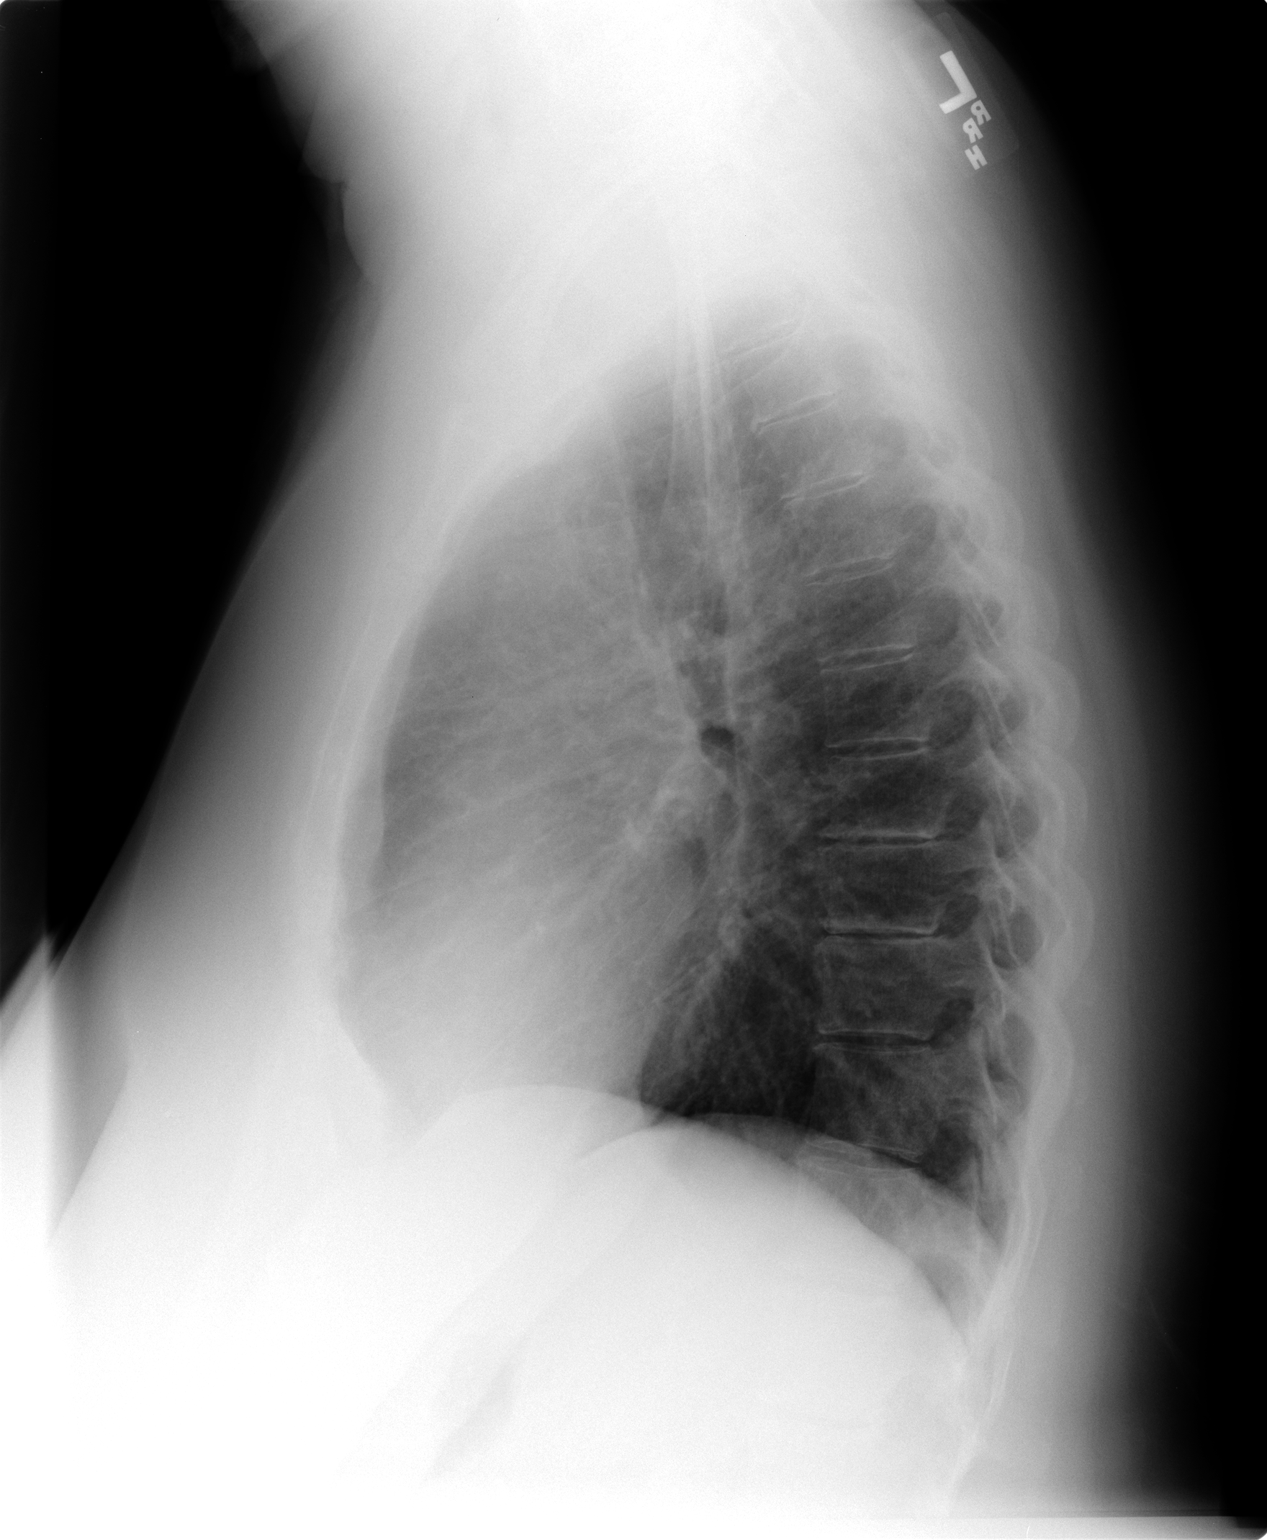

[2 of 2 positions shown; findings below may reference images not displayed]

FINDINGS: Mediastinum hilar structures are normal. Mild cardiomegaly with
normal pulmonary vascularity. No focal infiltrate. No pleural
effusion or pneumothorax. No acute osseous abnormality.
IMPRESSION: 1. Stable cardiomegaly, no CHF.
2. No acute pulmonary infiltrate.

## 2014-10-11 NOTE — Patient Instructions (Signed)
Claudia Keller  10/11/2014   Your procedure is scheduled on:  10/16/2014  Report to [facility] at  80  AM.  Call this number if you have problems the morning of surgery: [phone]   Remember:   Do not eat food or drink liquids after midnight.   Take these medicines the morning of surgery with A SIP OF WATER: amlodipine, clonazepam,gabapentin, levothyroxine, paxil. Take your albuterol before you come and bring it with you. Only take 1/2 of your usual insulin dosage the night before your surgery.   Do not wear jewelry, make-up or nail polish.  Do not wear lotions, powders, or perfumes.   Do not shave 48 hours prior to surgery. Men may shave face and neck.  Do not bring valuables to the hospital.  [facility] is not responsible for any belongings or valuables.               Contacts, dentures or bridgework may not be worn into surgery.  Leave suitcase in the car. After surgery it may be brought to your room.  For patients admitted to the hospital, discharge time is determined by your treatment team.               Patients discharged the day of surgery will not be allowed to drive  home.  Name and phone number of your driver: family  Special Instructions: Shower using CHG 2 nights before surgery and the night before surgery.  If you shower the day of surgery use CHG.  Use special wash - you have one bottle of CHG for all showers.  You should use approximately 1/3 of the bottle for each shower.   Please read over the following fact sheets that you were given: Pain Booklet, Coughing and Deep Breathing, Surgical Site Infection Prevention, Anesthesia Post-op Instructions and Care and Recovery After Surgery Breast Biopsy A breast biopsy is a procedure where a sample of breast tissue is removed from your breast. The tissue is examined under a microscope to see if cancerous cells are present. A breast biopsy is done when there is:  Any undiagnosed breast mass (tumor).  Nipple  abnormalities, dimpling, crusting, or ulcerations.  Abnormal discharge from the nipple, especially blood.  Redness, swelling, and pain of the breast.  Calcium deposits (calcifications) or abnormalities seen on a mammogram, ultrasound result, or results of magnetic resonance imaging (MRI).  Suspicious changes in the breast seen on your mammogram. If the tumor is found to be cancerous (malignant), a breast biopsy can help to determine what the best treatment is for you. There are many different types of breast biopsies. Talk to your caregiver about your options and which type is best for you. LET YOUR CAREGIVER KNOW ABOUT:  Allergies to food or medicine.  Medicines taken, including vitamins, herbs, eyedrops, over-the-counter medicines, and creams.  Use of steroids (by mouth or creams).  Previous problems with anesthetics or numbing medicines.  History of bleeding problems or blood clots.  Previous surgery.  Other health problems, including diabetes and kidney problems.  Any recent colds or infections.  Possibility of pregnancy, if this applies. RISKS AND COMPLICATIONS   Bleeding.  Infection.  Allergy to medicines.  Bruising and swelling of the breast.  Alteration in the shape of the breast.  Not finding the lump or abnormality.  Needing more surgery. BEFORE THE PROCEDURE  Arrange for someone to drive you home after the procedure.  Do not smoke for 2 weeks before  the procedure. Stop smoking, if you smoke.  Do not drink alcohol for 24 hours before procedure.  Wear a good support bra to the procedure. PROCEDURE  You may be given a medicine to numb the breast area (local anesthesia) or a medicine to make you sleep (general anesthesia) during the procedure. The following are the different types of biopsies that can be performed.   Fine-needle aspiration--A thin needle is attached to a syringe and inserted into the breast lump. Fluid and cells are removed and then  looked at under a microscope. If the breast lump cannot be felt, an ultrasound may be used to help locate the lump and place the needle in the correct area.   Core needle biopsy--A wide, hollow needle (core needle) is inserted into the breast lump 3-6 times to get tissue samples or cores. The samples are removed. The needle is usually placed in the correct area by using an ultrasound or X-ray.   Stereotactic biopsy--X-ray equipment and a computer are used to analyze X-ray pictures of the breast lump. The computer then finds exactly where the core needle needs to be inserted. Tissue samples are removed.   Vacuum-assisted biopsy--A small incision (less than  inch) is made in your breast. A biopsy device that includes a hollow needle and vacuum is passed through the incision and into the breast tissue. The vacuum gently draws abnormal breast tissue into the needle to remove it. This type of biopsy removes a larger tissue sample than a regular core needle biopsy. No stitches are needed, and there is usually little scarring.  Ultrasound-guided core needle biopsy--A high frequency ultrasound helps guide the core needle to the area of the mass or abnormality. An incision is made to insert the needle. Tissue samples are removed.  Open biopsy--A larger incision is made in the breast. Your caregiver will attempt to remove the whole breast lump or as much as possible. AFTER THE PROCEDURE  You will be taken to the recovery area. If you are doing well and have no problems, you will be allowed to go home.  You may notice bruising on your breast. This is normal.  Your caregiver may apply a pressure dressing on your breast for 24-48 hours. A pressure dressing is a bandage that is wrapped tightly around the chest to stop fluid from collecting underneath tissues. Document Released: 10/25/2005 Document Revised: 02/19/2013 Document Reviewed: 11/25/2011 [facility] Patient Information 2015 [place], [place]. This  information is not intended to replace advice given to you by your health care provider. Make sure you discuss any questions you have with your health care provider. PATIENT INSTRUCTIONS POST-ANESTHESIA  IMMEDIATELY FOLLOWING SURGERY:  Do not drive or operate machinery for the first twenty four hours after surgery.  Do not make any important decisions for twenty four hours after surgery or while taking narcotic pain medications or sedatives.  If you develop intractable nausea and vomiting or a severe headache please notify your doctor immediately.  FOLLOW-UP:  Please make an appointment with your surgeon as instructed. You do not need to follow up with anesthesia unless specifically instructed to do so.  WOUND CARE INSTRUCTIONS (if applicable):  Keep a dry clean dressing on the anesthesia/puncture wound site if there is drainage.  Once the wound has quit draining you may leave it open to air.  Generally you should leave the bandage intact for twenty four hours unless there is drainage.  If the epidural site drains for more than 36-48 hours please call the anesthesia  department.  QUESTIONS?:  Please feel free to call your physician or the hospital operator if you have any questions, and they will be happy to assist you.

## 2014-10-11 NOTE — Pre-Procedure Instructions (Signed)
Patient given information to sign up for my chart at home. 

## 2014-10-16 ENCOUNTER — Ambulatory Visit (HOSPITAL_COMMUNITY): Payer: Medicaid Other | Admitting: Anesthesiology

## 2014-10-16 ENCOUNTER — Encounter (HOSPITAL_COMMUNITY): Payer: Self-pay | Admitting: *Deleted

## 2014-10-16 ENCOUNTER — Encounter (HOSPITAL_COMMUNITY)
Admission: RE | Disposition: A | Discharge status: Home / Self Care | Payer: Self-pay | Source: Ambulatory Visit | Attending: General Surgery

## 2014-10-16 ENCOUNTER — Ambulatory Visit (HOSPITAL_COMMUNITY)
Admission: RE | Admit: 2014-10-16 | Discharge: 2014-10-16 | Disposition: A | Discharge status: Home / Self Care | Payer: Medicaid Other | Source: Ambulatory Visit | Attending: General Surgery | Admitting: General Surgery

## 2014-10-16 ENCOUNTER — Ambulatory Visit (HOSPITAL_COMMUNITY): Payer: Medicaid Other

## 2014-10-16 DIAGNOSIS — D493 Neoplasm of unspecified behavior of breast: Secondary | ICD-10-CM | POA: Diagnosis present

## 2014-10-16 DIAGNOSIS — N6092 Unspecified benign mammary dysplasia of left breast: Secondary | ICD-10-CM | POA: Diagnosis not present

## 2014-10-16 DIAGNOSIS — D242 Benign neoplasm of left breast: Secondary | ICD-10-CM | POA: Diagnosis not present

## 2014-10-16 DIAGNOSIS — N6320 Unspecified lump in the left breast, unspecified quadrant: Secondary | ICD-10-CM

## 2014-10-16 HISTORY — PX: BREAST BIOPSY: SHX20

## 2014-10-16 LAB — GLUCOSE, CAPILLARY: Glucose-Capillary: 159 mg/dL — ABNORMAL HIGH (ref 70–99)

## 2014-10-16 SURGERY — BREAST BIOPSY WITH NEEDLE LOCALIZATION
Anesthesia: General | Site: Breast | Laterality: Left

## 2014-10-16 MED ORDER — FENTANYL CITRATE 0.05 MG/ML IJ SOLN
INTRAMUSCULAR | Status: AC
Start: 2014-10-16 — End: 2014-10-16
  Filled 2014-10-16: qty 2

## 2014-10-16 MED ORDER — ONDANSETRON HCL 4 MG/2ML IJ SOLN
4.0000 mg | Freq: Once | INTRAMUSCULAR | Status: AC
  Administered 2014-10-16: 4 mg via INTRAVENOUS

## 2014-10-16 MED ORDER — 0.9 % SODIUM CHLORIDE (POUR BTL) OPTIME
TOPICAL | Status: DC | PRN
  Administered 2014-10-16: 1000 mL

## 2014-10-16 MED ORDER — FENTANYL CITRATE 0.05 MG/ML IJ SOLN
INTRAMUSCULAR | Status: DC | PRN
  Administered 2014-10-16: 50 ug via INTRAVENOUS
  Administered 2014-10-16 (×2): 25 ug via INTRAVENOUS

## 2014-10-16 MED ORDER — EPHEDRINE SULFATE 50 MG/ML IJ SOLN
INTRAMUSCULAR | Status: DC | PRN
  Administered 2014-10-16 (×3): 10 mg via INTRAVENOUS

## 2014-10-16 MED ORDER — EPHEDRINE SULFATE 50 MG/ML IJ SOLN
INTRAMUSCULAR | Status: AC
  Filled 2014-10-16: qty 1

## 2014-10-16 MED ORDER — LIDOCAINE HCL (PF) 1 % IJ SOLN
INTRAMUSCULAR | Status: AC
  Filled 2014-10-16: qty 5

## 2014-10-16 MED ORDER — ONDANSETRON HCL 4 MG/2ML IJ SOLN: 4.0000 mg | Freq: Once | INTRAMUSCULAR | Status: DC | PRN

## 2014-10-16 MED ORDER — LIDOCAINE HCL (PF) 2 % IJ SOLN
INTRAMUSCULAR | Status: AC
  Filled 2014-10-16: qty 10

## 2014-10-16 MED ORDER — SODIUM CHLORIDE 0.9 % IJ SOLN
INTRAMUSCULAR | Status: AC
  Filled 2014-10-16: qty 10

## 2014-10-16 MED ORDER — CHLORHEXIDINE GLUCONATE 4 % EX LIQD: 1.0000 "application " | Freq: Once | CUTANEOUS | Status: DC

## 2014-10-16 MED ORDER — LIDOCAINE HCL 1 % IJ SOLN
INTRAMUSCULAR | Status: DC | PRN
  Administered 2014-10-16: 40 mg via INTRADERMAL

## 2014-10-16 MED ORDER — PROPOFOL 10 MG/ML IV BOLUS
INTRAVENOUS | Status: DC | PRN
  Administered 2014-10-16: 160 mg via INTRAVENOUS

## 2014-10-16 MED ORDER — FENTANYL CITRATE 0.05 MG/ML IJ SOLN
25.0000 ug | INTRAMUSCULAR | Status: AC
  Administered 2014-10-16 (×2): 25 ug via INTRAVENOUS

## 2014-10-16 MED ORDER — CEFAZOLIN SODIUM-DEXTROSE 2-3 GM-% IV SOLR
INTRAVENOUS | Status: AC
  Filled 2014-10-16: qty 50

## 2014-10-16 MED ORDER — FENTANYL CITRATE 0.05 MG/ML IJ SOLN: 25.0000 ug | INTRAMUSCULAR | Status: DC | PRN

## 2014-10-16 MED ORDER — MIDAZOLAM HCL 2 MG/2ML IJ SOLN
1.0000 mg | INTRAMUSCULAR | Status: DC | PRN
  Administered 2014-10-16 (×2): 2 mg via INTRAVENOUS

## 2014-10-16 MED ORDER — PROPOFOL 10 MG/ML IV EMUL
INTRAVENOUS | Status: AC
Start: 2014-10-16 — End: 2014-10-16
  Filled 2014-10-16: qty 20

## 2014-10-16 MED ORDER — BUPIVACAINE HCL (PF) 0.5 % IJ SOLN
INTRAMUSCULAR | Status: DC | PRN
  Administered 2014-10-16: 10 mL

## 2014-10-16 MED ORDER — KETOROLAC TROMETHAMINE 30 MG/ML IJ SOLN
30.0000 mg | Freq: Once | INTRAMUSCULAR | Status: AC
  Administered 2014-10-16: 30 mg via INTRAVENOUS
  Filled 2014-10-16: qty 1

## 2014-10-16 MED ORDER — CEFAZOLIN SODIUM-DEXTROSE 2-3 GM-% IV SOLR
2.0000 g | INTRAVENOUS | Status: AC
  Administered 2014-10-16: 2 g via INTRAVENOUS

## 2014-10-16 MED ORDER — MIDAZOLAM HCL 5 MG/5ML IJ SOLN
INTRAMUSCULAR | Status: DC | PRN
  Administered 2014-10-16: 2 mg via INTRAVENOUS

## 2014-10-16 MED ORDER — MIDAZOLAM HCL 2 MG/2ML IJ SOLN
INTRAMUSCULAR | Status: AC
  Filled 2014-10-16: qty 2

## 2014-10-16 MED ORDER — ONDANSETRON HCL 4 MG/2ML IJ SOLN
INTRAMUSCULAR | Status: AC
  Filled 2014-10-16: qty 2

## 2014-10-16 MED ORDER — BUPIVACAINE HCL (PF) 0.5 % IJ SOLN
INTRAMUSCULAR | Status: AC
  Filled 2014-10-16: qty 30

## 2014-10-16 MED ORDER — HYDROCODONE-ACETAMINOPHEN 5-325 MG PO TABS: 1.0000 | ORAL_TABLET | ORAL | Status: DC | PRN

## 2014-10-16 MED ORDER — LACTATED RINGERS IV SOLN
INTRAVENOUS | Status: DC
  Administered 2014-10-16: 09:00:00 via INTRAVENOUS

## 2014-10-16 SURGICAL SUPPLY — 33 items
BAG HAMPER (MISCELLANEOUS) ×2 IMPLANT
BLADE 15 SAFETY STRL DISP (BLADE) IMPLANT
CHLORAPREP W/TINT 26ML (MISCELLANEOUS) IMPLANT
CLOTH BEACON ORANGE TIMEOUT ST (SAFETY) ×2 IMPLANT
COVER LIGHT HANDLE STERIS (MISCELLANEOUS) ×4 IMPLANT
DERMABOND ADVANCED (GAUZE/BANDAGES/DRESSINGS)
DERMABOND ADVANCED .7 DNX12 (GAUZE/BANDAGES/DRESSINGS) IMPLANT
ELECT REM PT RETURN 9FT ADLT (ELECTROSURGICAL) ×2
ELECTRODE REM PT RTRN 9FT ADLT (ELECTROSURGICAL) ×1 IMPLANT
FORMALIN 10 PREFIL 120ML (MISCELLANEOUS) ×2 IMPLANT
GLOVE BIOGEL PI IND STRL 7.0 (GLOVE) ×1 IMPLANT
GLOVE BIOGEL PI INDICATOR 7.0 (GLOVE) ×1
GLOVE ECLIPSE 6.5 STRL STRAW (GLOVE) ×2 IMPLANT
GLOVE EXAM NITRILE MD LF STRL (GLOVE) ×2 IMPLANT
GLOVE SURG SS PI 7.5 STRL IVOR (GLOVE) ×4 IMPLANT
GOWN STRL REUS W/TWL LRG LVL3 (GOWN DISPOSABLE) ×6 IMPLANT
KIT ROOM TURNOVER APOR (KITS) ×2 IMPLANT
LIQUID BAND (GAUZE/BANDAGES/DRESSINGS) ×2 IMPLANT
MANIFOLD NEPTUNE II (INSTRUMENTS) ×2 IMPLANT
NEEDLE HYPO 18GX1.5 BLUNT FILL (NEEDLE) IMPLANT
NEEDLE HYPO 25X1 1.5 SAFETY (NEEDLE) ×2 IMPLANT
NS IRRIG 1000ML POUR BTL (IV SOLUTION) ×2 IMPLANT
PACK MINOR (CUSTOM PROCEDURE TRAY) ×2 IMPLANT
PAD ARMBOARD 7.5X6 YLW CONV (MISCELLANEOUS) ×2 IMPLANT
SET BASIN LINEN APH (SET/KITS/TRAYS/PACK) ×2 IMPLANT
SPONGE GAUZE 2X2 8PLY STRL LF (GAUZE/BANDAGES/DRESSINGS) IMPLANT
STRIP CLOSURE SKIN 1/4X3 (GAUZE/BANDAGES/DRESSINGS) IMPLANT
SUT SILK 2 0 SH (SUTURE) IMPLANT
SUT VIC AB 3-0 SH 27 (SUTURE) ×2
SUT VIC AB 3-0 SH 27X BRD (SUTURE) ×1 IMPLANT
SUT VIC AB 4-0 PS2 27 (SUTURE) ×2 IMPLANT
SYR CONTROL 10ML LL (SYRINGE) ×2 IMPLANT
SYRINGE 10CC LL (SYRINGE) ×2 IMPLANT

## 2014-10-16 NOTE — Anesthesia Preprocedure Evaluation (Signed)
Anesthesia Evaluation  Patient identified by MRN, date of birth, ID band Patient awake    Reviewed: Allergy & Precautions, H&P , NPO status , Patient's Chart, lab work & pertinent test results  History of Anesthesia Complications Negative for: history of anesthetic complications  Airway Mallampati: I  TM Distance: >3 FB     Dental  (+) Edentulous Upper, Edentulous Lower   Pulmonary shortness of breath and with exertion, COPD COPD inhaler, Current Smoker,  breath sounds clear to auscultation        Cardiovascular hypertension, Pt. on medications Rhythm:Regular Rate:Normal     Neuro/Psych PSYCHIATRIC DISORDERS Anxiety Bipolar Disorder    GI/Hepatic   Endo/Other  diabetes, Well Controlled, Type 2, Insulin DependentHypothyroidism   Renal/GU      Musculoskeletal   Abdominal   Peds  Hematology   Anesthesia Other Findings   Reproductive/Obstetrics                             Anesthesia Physical Anesthesia Plan  ASA: III  Anesthesia Plan: General   Post-op Pain Management:    Induction: Intravenous  Airway Management Planned: LMA  Additional Equipment:   Intra-op Plan:   Post-operative Plan: Extubation in OR  Informed Consent: I have reviewed the patients History and Physical, chart, labs and discussed the procedure including the risks, benefits and alternatives for the proposed anesthesia with the patient or authorized representative who has indicated his/her understanding and acceptance.     Plan Discussed with:   Anesthesia Plan Comments:         Anesthesia Quick Evaluation

## 2014-10-16 NOTE — Op Note (Signed)
Patient:  Claudia Keller  DOB:  Jan 29, 1951  MRN:  [id number]   Preop Diagnosis:  Left breast neoplasm  Postop Diagnosis:  Same  Procedure:  Left breast biopsy after needle localization  Surgeon:  Aviva Signs, M.D.  Anes:  Gen.  Indications:  Patient is a 63 year old white female multiple medical problems who comes in with an abnormal left mammogram. The neoplasm is not palpable. Patient now comes to the operating room for left breast biopsy after needle localization. The risks and benefits of the procedure including bleeding and infection were fully explained to the patient, who gave informed consent.  Procedure note:  The patient was placed the supine position. After general anesthesia was administered, the left breast was prepped and draped using usual sterile technique with ChloraPrep. Surgical site confirmation was performed. The patient already undergone needle localization in the radiology department.  A needle is located in the upper, outer quadrant of left breast. An incision was made at the needle site. A left breast biopsy was performed in order to include the area that was suspicious in nature. The specimen was sent to radiology for radiography. This did confirm the suspicious neoplasm to be within the specimen removed. It was then sent to pathology further examination. Any bleeding was controlled using Bovie electrocautery. The wound was irrigated normal saline. The skin was injected with 0.5% Sensorcaine. The skin was closed using a 4-0 Vicryl subcuticular suture. Liquid band was then applied.  All tape and needle counts were correct at the end of the procedure. The patient was awakened and transferred to PACU in stable condition.  Complications:  None  EBL:  Minimal  Specimen:  Left breast tissue

## 2014-10-16 NOTE — Interval H&P Note (Signed)
History and Physical Interval Note:  10/16/2014 9:51 AM  Claudia Keller  has presented today for surgery, with the diagnosis of left breast mass  The various methods of treatment have been discussed with the patient and family. After consideration of risks, benefits and other options for treatment, the patient has consented to  Procedure(s): BREAST BIOPSY WITH NEEDLE LOCALIZATION (Left) as a surgical intervention .  The patient's history has been reviewed, patient examined, no change in status, stable for surgery.  I have reviewed the patient's chart and labs.  Questions were answered to the patient's satisfaction.     Aviva Signs A

## 2014-10-16 NOTE — Anesthesia Procedure Notes (Signed)
Procedure Name: LMA Insertion Date/Time: 10/16/2014 10:46 AM Performed by: Charmaine Downs Pre-anesthesia Checklist: Patient identified, Emergency Drugs available, Suction available and Patient being monitored Patient Re-evaluated:Patient Re-evaluated prior to inductionOxygen Delivery Method: Circle system utilized Preoxygenation: Pre-oxygenation with 100% oxygen Intubation Type: IV induction Ventilation: Mask ventilation without difficulty LMA: LMA inserted LMA Size: 3.0 Grade View: Grade I Tube type: Oral Number of attempts: 1 Placement Confirmation: positive ETCO2 and breath sounds checked- equal and bilateral Tube secured with: Tape Dental Injury: Teeth and Oropharynx as per pre-operative assessment

## 2014-10-16 NOTE — Anesthesia Postprocedure Evaluation (Signed)
  Anesthesia Post-op Note  Patient: Claudia Keller  Procedure(s) Performed: Procedure(s): LEFT BREAST BIOPSY AFTER NEEDLE LOCALIZATION (Left)  Patient Location: PACU  Anesthesia Type:General  Level of Consciousness: awake, alert , oriented and patient cooperative  Airway and Oxygen Therapy: Patient Spontanous Breathing and Patient connected to face mask oxygen  Post-op Pain: 2 /10, mild  Post-op Assessment: Post-op Vital signs reviewed, Patient's Cardiovascular Status Stable, Respiratory Function Stable, Patent Airway, Pain level controlled and No headache  Post-op Vital Signs: Reviewed and stable  Last Vitals:  Filed Vitals:   10/16/14 1030  BP: 106/60  Pulse:   Temp:   Resp: 33    Complications: No apparent anesthesia complications

## 2014-10-16 NOTE — Transfer of Care (Signed)
Immediate Anesthesia Transfer of Care Note  Patient: Claudia Keller  Procedure(s) Performed: Procedure(s): LEFT BREAST BIOPSY AFTER NEEDLE LOCALIZATION (Left)  Patient Location: PACU  Anesthesia Type:General  Level of Consciousness: awake, alert , oriented and patient cooperative  Airway & Oxygen Therapy: Patient Spontanous Breathing and Patient connected to face mask oxygen  Post-op Assessment: Report given to PACU RN, Post -op Vital signs reviewed and stable and Patient moving all extremities  Post vital signs: Reviewed and stable  Complications: No apparent anesthesia complications

## 2014-10-16 NOTE — Discharge Instructions (Signed)
Breast Biopsy  °Care After °Refer to this sheet in the next few weeks. These instructions provide you with information on caring for yourself after your procedure. Your caregiver may also give you more specific instructions. Your treatment has been planned according to current medical practices, but problems sometimes occur. Call your caregiver if you have any problems or questions after your procedure. °HOME CARE INSTRUCTIONS  °· Only take over-the-counter or prescription medicines for pain, discomfort, or fever as directed by your caregiver. °· Do not take aspirin. It can cause bleeding. °· Keep stitches dry when bathing. °· Protect the biopsy area. Do not let the area get bumped. °· Avoid activities that may pull the incision site open until approved by your caregiver. This can include stretching, reaching, exercise, sports, or lifting over 3 pounds. °· Resume your usual diet. °· Wear a good support bra for as long as directed by your caregiver. °· Change any bandages (dressings) as directed by your caregiver. °· Do not drink alcohol while taking pain medicine. °· Keep all your follow-up appointments with your caregiver. Ask when your test results will be ready. Make sure you get your test results. °SEEK MEDICAL CARE IF:  °· You have redness, swelling, or increasing pain in the biopsy site. °· You have a bad smell coming from the biopsy site or dressing. °· Your biopsy site breaks open after the stitches (sutures), staples, or skin adhesive strips have been removed. °· You have a rash. °· You need stronger medicine. °SEEK IMMEDIATE MEDICAL CARE IF:  °· You have a fever. °· You have increased bleeding (more than a small spot) from the biopsy site. °· You have difficulty breathing. °· You have pus coming from the biopsy site. °MAKE SURE YOU: °· Understand these instructions. °· Will watch your condition. °· Will get help right away if you are not doing well or get worse. °Document Released: 05/14/2005 Document  Revised: 01/17/2012 Document Reviewed: 11/25/2011 °[employer]® Patient Information ©2015 [employer]. This information is not intended to replace advice given to you by your health care provider. Make sure you discuss any questions you have with your health care provider. ° °

## 2014-10-17 ENCOUNTER — Encounter (HOSPITAL_COMMUNITY): Payer: Self-pay | Admitting: General Surgery

## 2014-11-04 ENCOUNTER — Encounter: Payer: Self-pay | Admitting: Internal Medicine

## 2014-12-13 ENCOUNTER — Ambulatory Visit: Payer: Medicaid Other | Admitting: Gastroenterology

## 2015-01-01 ENCOUNTER — Ambulatory Visit: Payer: Medicaid Other | Admitting: Gastroenterology

## 2015-01-01 ENCOUNTER — Telehealth: Payer: Self-pay | Admitting: Gastroenterology

## 2015-01-01 ENCOUNTER — Encounter: Payer: Self-pay | Admitting: Gastroenterology

## 2015-01-01 NOTE — Telephone Encounter (Signed)
PATIENT WAS A NO SHOW 01/01/15 AND LETTER WAS SENT  °

## 2015-01-10 ENCOUNTER — Emergency Department (HOSPITAL_COMMUNITY)
Admission: EM | Admit: 2015-01-10 | Discharge: 2015-01-10 | Disposition: A | Discharge status: Home / Self Care | Payer: Medicaid Other | Attending: Emergency Medicine | Admitting: Emergency Medicine

## 2015-01-10 ENCOUNTER — Emergency Department (HOSPITAL_COMMUNITY): Payer: Medicaid Other

## 2015-01-10 ENCOUNTER — Encounter (HOSPITAL_COMMUNITY): Payer: Self-pay | Admitting: Emergency Medicine

## 2015-01-10 DIAGNOSIS — R112 Nausea with vomiting, unspecified: Secondary | ICD-10-CM | POA: Diagnosis not present

## 2015-01-10 DIAGNOSIS — N390 Urinary tract infection, site not specified: Secondary | ICD-10-CM

## 2015-01-10 DIAGNOSIS — I10 Essential (primary) hypertension: Secondary | ICD-10-CM | POA: Diagnosis not present

## 2015-01-10 DIAGNOSIS — J449 Chronic obstructive pulmonary disease, unspecified: Secondary | ICD-10-CM | POA: Diagnosis not present

## 2015-01-10 DIAGNOSIS — F319 Bipolar disorder, unspecified: Secondary | ICD-10-CM | POA: Diagnosis not present

## 2015-01-10 DIAGNOSIS — E039 Hypothyroidism, unspecified: Secondary | ICD-10-CM | POA: Diagnosis not present

## 2015-01-10 DIAGNOSIS — M1990 Unspecified osteoarthritis, unspecified site: Secondary | ICD-10-CM | POA: Diagnosis not present

## 2015-01-10 DIAGNOSIS — E119 Type 2 diabetes mellitus without complications: Secondary | ICD-10-CM | POA: Insufficient documentation

## 2015-01-10 DIAGNOSIS — E785 Hyperlipidemia, unspecified: Secondary | ICD-10-CM | POA: Diagnosis not present

## 2015-01-10 DIAGNOSIS — K589 Irritable bowel syndrome without diarrhea: Secondary | ICD-10-CM | POA: Insufficient documentation

## 2015-01-10 DIAGNOSIS — F4310 Post-traumatic stress disorder, unspecified: Secondary | ICD-10-CM | POA: Insufficient documentation

## 2015-01-10 DIAGNOSIS — Z720 Tobacco use: Secondary | ICD-10-CM | POA: Insufficient documentation

## 2015-01-10 DIAGNOSIS — Z79899 Other long term (current) drug therapy: Secondary | ICD-10-CM | POA: Diagnosis not present

## 2015-01-10 DIAGNOSIS — Z794 Long term (current) use of insulin: Secondary | ICD-10-CM | POA: Diagnosis not present

## 2015-01-10 DIAGNOSIS — R197 Diarrhea, unspecified: Secondary | ICD-10-CM | POA: Diagnosis not present

## 2015-01-10 DIAGNOSIS — Z86010 Personal history of colonic polyps: Secondary | ICD-10-CM | POA: Diagnosis not present

## 2015-01-10 DIAGNOSIS — Z7982 Long term (current) use of aspirin: Secondary | ICD-10-CM | POA: Diagnosis not present

## 2015-01-10 HISTORY — DX: Diverticulosis of intestine, part unspecified, without perforation or abscess without bleeding: K57.90

## 2015-01-10 HISTORY — DX: Irritable bowel syndrome, unspecified: K58.9

## 2015-01-10 HISTORY — DX: Tobacco use: Z72.0

## 2015-01-10 LAB — LIPASE, BLOOD: Lipase: 20 U/L (ref 11–59)

## 2015-01-10 LAB — CBC WITH DIFFERENTIAL/PLATELET
Basophils Absolute: 0 10*3/uL (ref 0.0–0.1)
Basophils Relative: 0 % (ref 0–1)
Eosinophils Absolute: 0.1 10*3/uL (ref 0.0–0.7)
Eosinophils Relative: 1 % (ref 0–5)
HCT: 45.6 % (ref 36.0–46.0)
Hemoglobin: 15.8 g/dL — ABNORMAL HIGH (ref 12.0–15.0)
Lymphocytes Relative: 27 % (ref 12–46)
Lymphs Abs: 2.8 10*3/uL (ref 0.7–4.0)
MCH: 31.1 pg (ref 26.0–34.0)
MCHC: 34.6 g/dL (ref 30.0–36.0)
MCV: 89.8 fL (ref 78.0–100.0)
Monocytes Absolute: 0.8 10*3/uL (ref 0.1–1.0)
Monocytes Relative: 7 % (ref 3–12)
Neutro Abs: 6.9 10*3/uL (ref 1.7–7.7)
Neutrophils Relative %: 65 % (ref 43–77)
Platelets: 172 10*3/uL (ref 150–400)
RBC: 5.08 MIL/uL (ref 3.87–5.11)
RDW: 13.7 % (ref 11.5–15.5)
WBC: 10.6 10*3/uL — ABNORMAL HIGH (ref 4.0–10.5)

## 2015-01-10 LAB — URINE MICROSCOPIC-ADD ON

## 2015-01-10 LAB — URINALYSIS, ROUTINE W REFLEX MICROSCOPIC
Bilirubin Urine: NEGATIVE
Glucose, UA: NEGATIVE mg/dL
Hgb urine dipstick: NEGATIVE
Ketones, ur: NEGATIVE mg/dL
Nitrite: POSITIVE — AB
Protein, ur: NEGATIVE mg/dL
Specific Gravity, Urine: 1.02 (ref 1.005–1.030)
Urobilinogen, UA: 1 mg/dL (ref 0.0–1.0)
pH: 6 (ref 5.0–8.0)

## 2015-01-10 LAB — COMPREHENSIVE METABOLIC PANEL
ALT: 35 U/L (ref 0–35)
AST: 30 U/L (ref 0–37)
Albumin: 3.9 g/dL (ref 3.5–5.2)
Alkaline Phosphatase: 105 U/L (ref 39–117)
Anion gap: 8 (ref 5–15)
BUN: 17 mg/dL (ref 6–23)
CO2: 28 mmol/L (ref 19–32)
Calcium: 9.5 mg/dL (ref 8.4–10.5)
Chloride: 102 mmol/L (ref 96–112)
Creatinine, Ser: 1.09 mg/dL (ref 0.50–1.10)
GFR calc Af Amer: 62 mL/min — ABNORMAL LOW (ref >90)
GFR calc non Af Amer: 53 mL/min — ABNORMAL LOW (ref >90)
Glucose, Bld: 175 mg/dL — ABNORMAL HIGH (ref 70–99)
Potassium: 3.4 mmol/L — ABNORMAL LOW (ref 3.5–5.1)
Sodium: 138 mmol/L (ref 135–145)
Total Bilirubin: 0.7 mg/dL (ref 0.3–1.2)
Total Protein: 6.7 g/dL (ref 6.0–8.3)

## 2015-01-10 IMAGING — CT CT ABD-PELV W/ CM
2 of 5 series · 16 of 46 positions shown, 18 images · IV contrast (omnipaque)
Comparison: CT of the abdomen and pelvis from [DATE]

CLINICAL DATA: Acute onset of vomiting and diarrhea. Initial
encounter.

EXAM:
CT ABDOMEN AND PELVIS WITH CONTRAST
TECHNIQUE: Multidetector CT imaging of the abdomen and pelvis was performed
using the standard protocol following bolus administration of
intravenous contrast.
CONTRAST:  100mL OMNIPAQUE IOHEXOL 300 MG/ML  SOLN

[Series 2: abd_pel_with 5.0 b40f · axial · 0.88mm/px · z∈[-481,-86]mm · 13 of 89 slices shown, 15 images]
[im 5/89  soft-tissue]
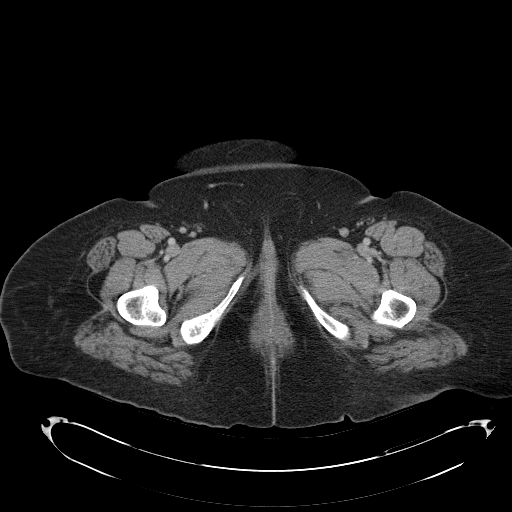
[im 5/89  bone]
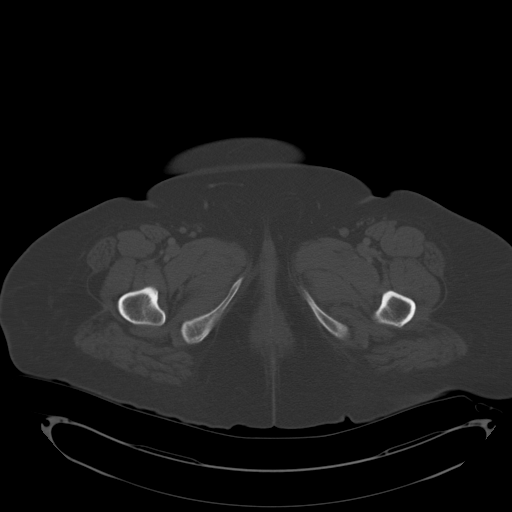
[im 10/89  soft-tissue]
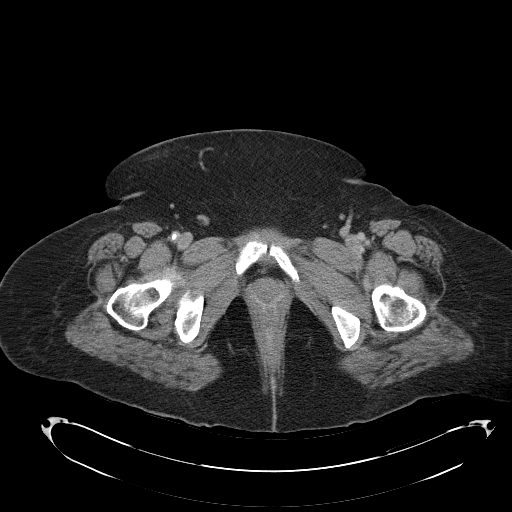
[im 20/89  soft-tissue]
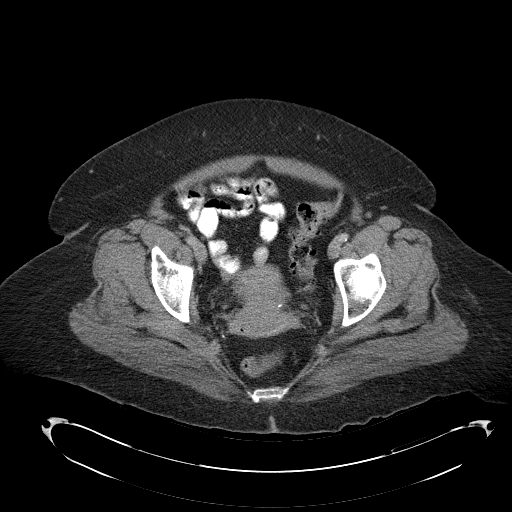
[im 25/89  soft-tissue]
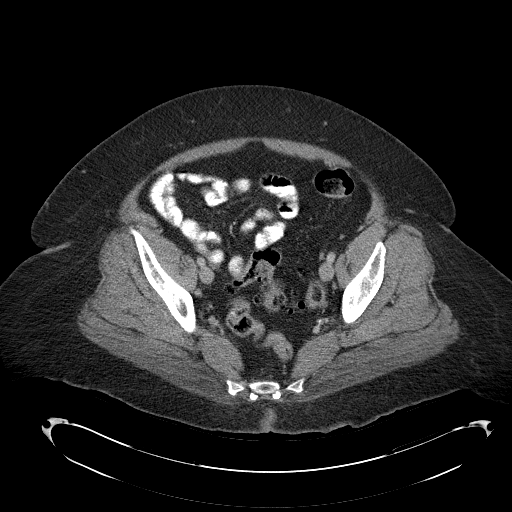
[im 30/89  soft-tissue]
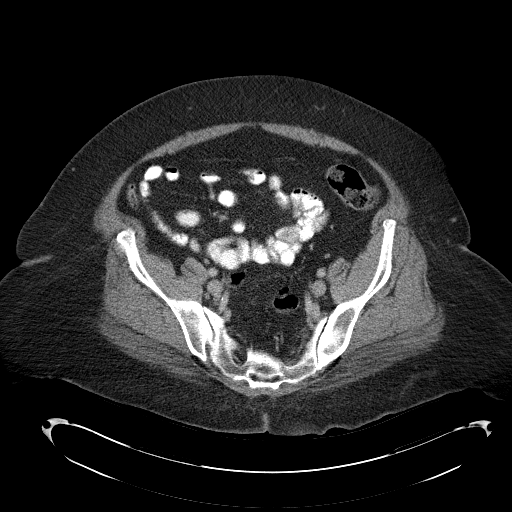
[im 40/89  soft-tissue]
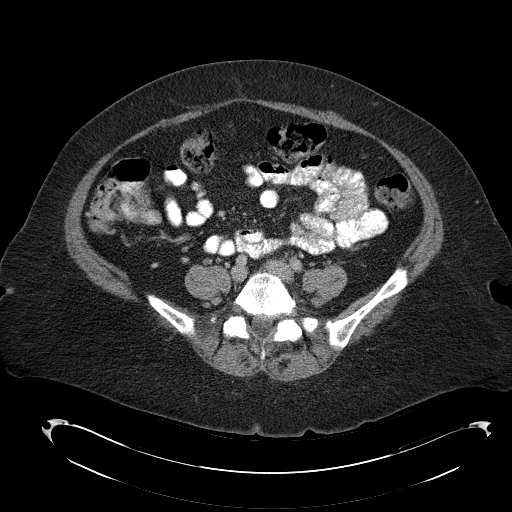
[im 45/89  soft-tissue]
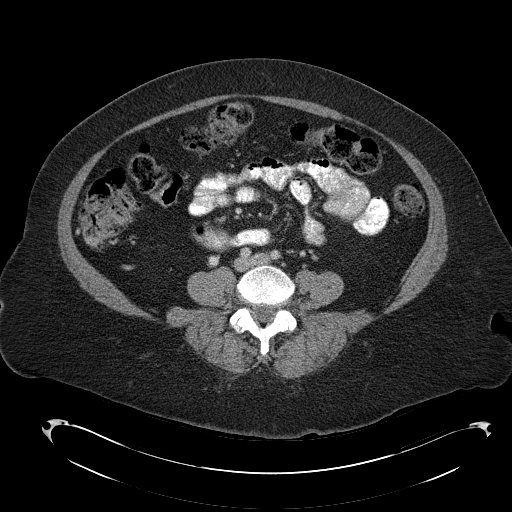
[im 49/89  soft-tissue]
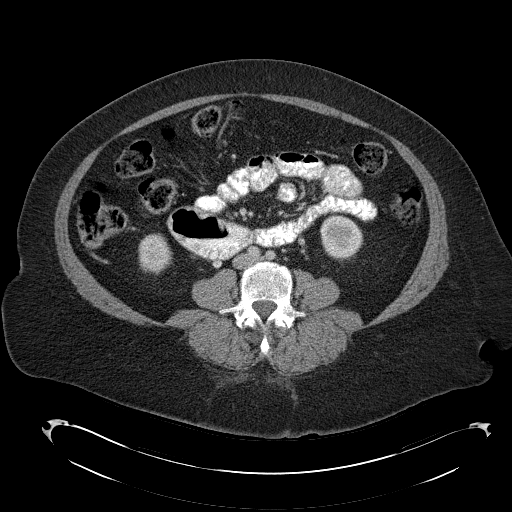
[im 59/89  soft-tissue]
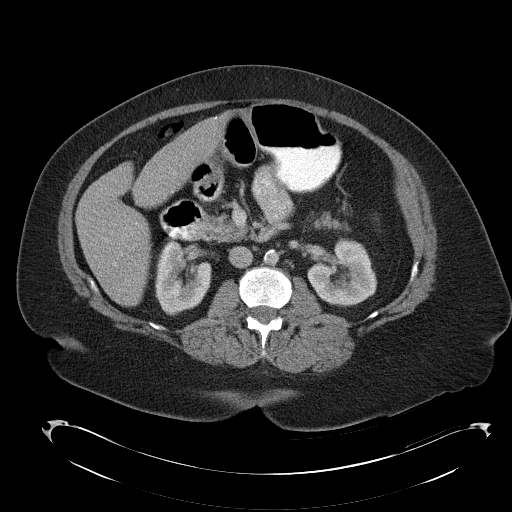
[im 59/89  bone]
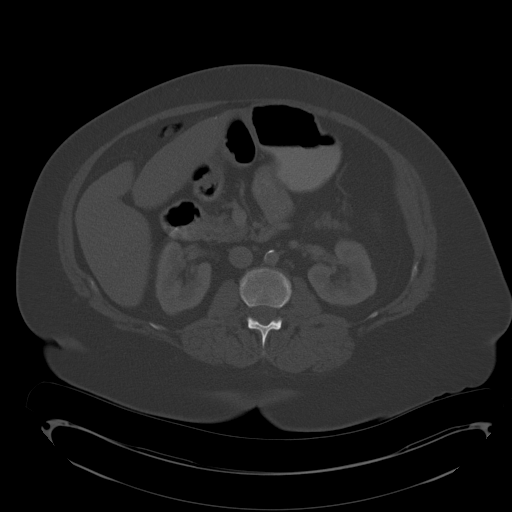
[im 64/89  soft-tissue]
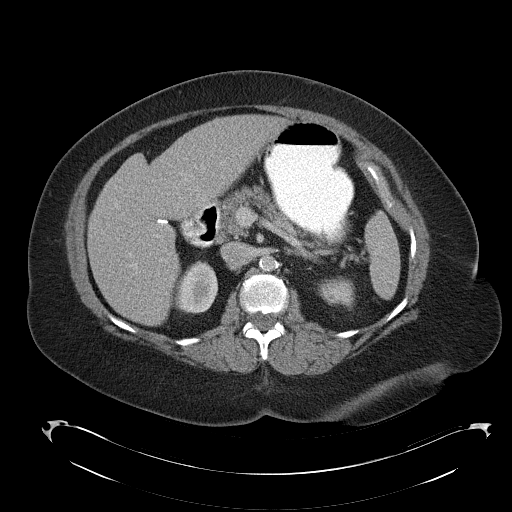
[im 69/89  soft-tissue]
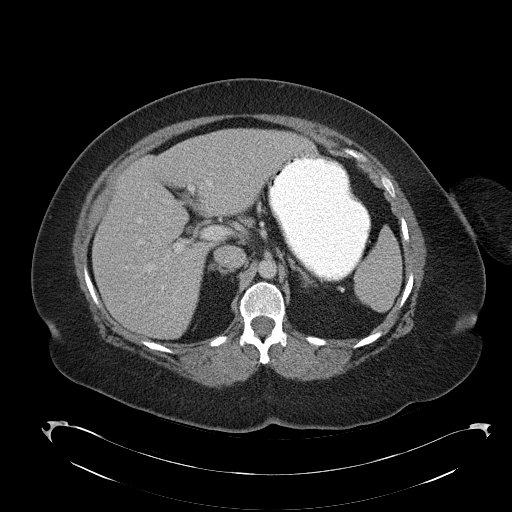
[im 79/89  soft-tissue]
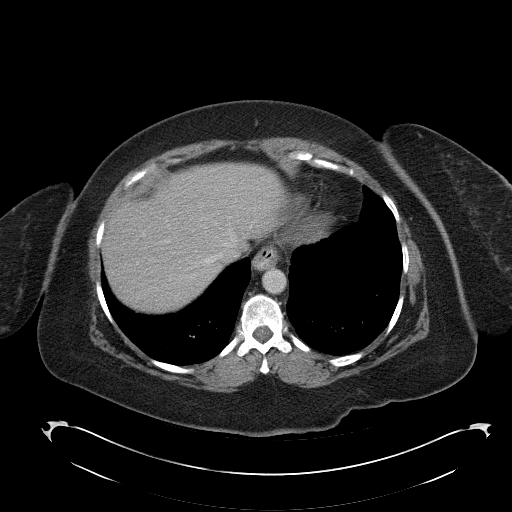
[im 84/89  soft-tissue]
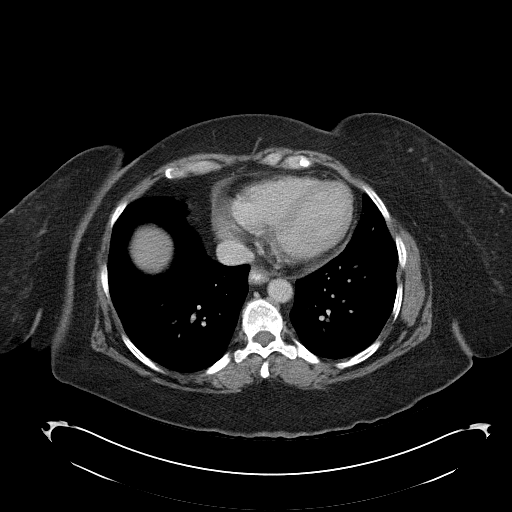

[Series 4: abd_pel_with 3.0 spo cor · coronal · 0.79mm/px · 3 of 105 slices shown]
[im 35/105  soft-tissue]
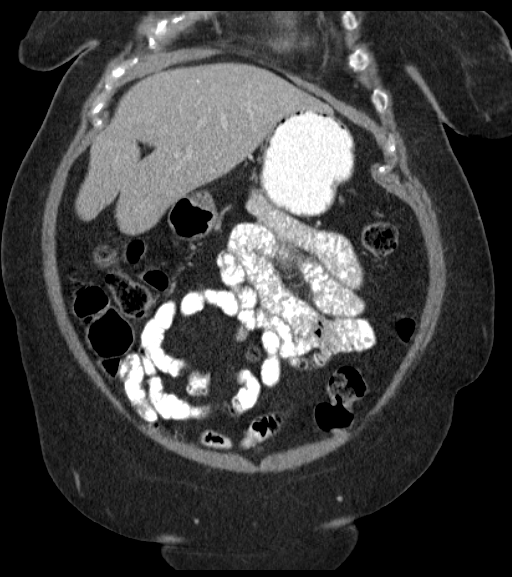
[im 47/105  soft-tissue]
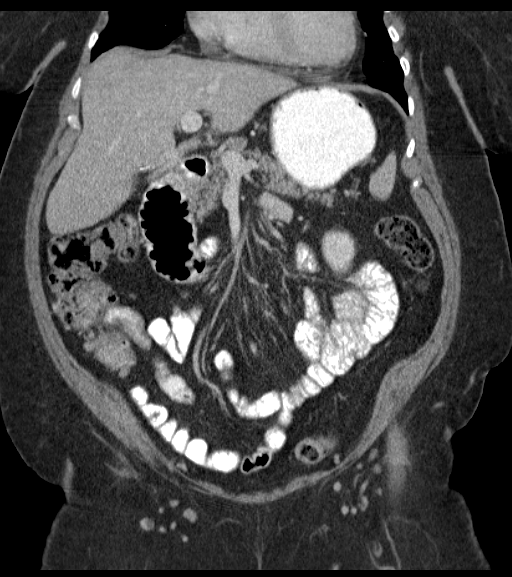
[im 58/105  soft-tissue]
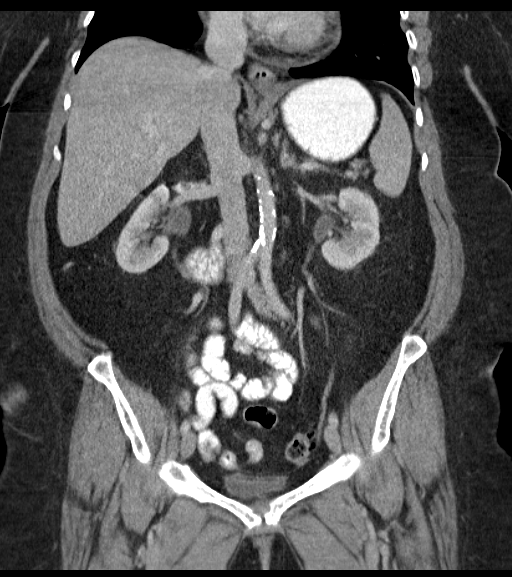

[16 of 46 positions shown; findings below may reference images not displayed]

FINDINGS: The visualized lung bases are clear. Trace pericardial fluid remains
within normal limits.

Scattered calcified granulomata are seen within the liver and
spleen. The liver and spleen are otherwise unremarkable. The patient
is status post cholecystectomy, with clips noted along the
gallbladder fossa. The pancreas and adrenal glands are unremarkable.

The kidneys are unremarkable in appearance. There is no evidence of
hydronephrosis. No renal or ureteral stones are seen. No perinephric
stranding is appreciated.

No free fluid is identified. The small bowel is unremarkable in
appearance. The stomach is within normal limits. No acute vascular
abnormalities are seen. Scattered calcification is seen along the
abdominal aorta and its branches, with likely mild luminal narrowing
at the aortic bifurcation.

The appendix is normal in caliber, without evidence for
appendicitis. Contrast progresses to the level of the cecum. The
colon is partially filled with stool. Scattered diverticulosis is
noted along the sigmoid colon, without evidence of diverticulitis.

The bladder is decompressed and not well assessed. The uterus is
grossly unremarkable in appearance. The ovaries are relatively
symmetric. No suspicious adnexal masses are seen. No inguinal
lymphadenopathy is seen.

No acute osseous abnormalities are identified. Vacuum phenomenon is
noted at L5-S1, with associated endplate sclerosis.
IMPRESSION: 1. No acute abnormality seen within the abdomen or pelvis.
2. Scattered diverticulosis along the sigmoid colon, without
evidence of diverticulitis.
3. Scattered calcification along the abdominal aorta and its
branches, with likely mild luminal narrowing at the aortic
bifurcation.

## 2015-01-10 MED ORDER — ONDANSETRON HCL 4 MG PO TABS: 4.0000 mg | ORAL_TABLET | Freq: Three times a day (TID) | ORAL | Status: DC | PRN

## 2015-01-10 MED ORDER — DEXTROSE 5 % IV SOLN
1.0000 g | Freq: Once | INTRAVENOUS | Status: AC
  Administered 2015-01-10: 1 g via INTRAVENOUS
  Filled 2015-01-10: qty 10

## 2015-01-10 MED ORDER — ONDANSETRON HCL 4 MG/2ML IJ SOLN
4.0000 mg | INTRAMUSCULAR | Status: DC | PRN
  Administered 2015-01-10: 4 mg via INTRAVENOUS
  Filled 2015-01-10 (×2): qty 2

## 2015-01-10 MED ORDER — SODIUM CHLORIDE 0.9 % IV SOLN
INTRAVENOUS | Status: DC
  Administered 2015-01-10: 18:00:00 via INTRAVENOUS

## 2015-01-10 MED ORDER — DICYCLOMINE HCL 10 MG/ML IM SOLN
20.0000 mg | Freq: Once | INTRAMUSCULAR | Status: AC
  Administered 2015-01-10: 20 mg via INTRAMUSCULAR
  Filled 2015-01-10: qty 2

## 2015-01-10 MED ORDER — CEPHALEXIN 500 MG PO CAPS: 500.0000 mg | ORAL_CAPSULE | Freq: Four times a day (QID) | ORAL | Status: DC

## 2015-01-10 MED ORDER — IOHEXOL 300 MG/ML  SOLN
100.0000 mL | Freq: Once | INTRAMUSCULAR | Status: AC | PRN
  Administered 2015-01-10: 100 mL via INTRAVENOUS

## 2015-01-10 MED ORDER — POTASSIUM CHLORIDE CRYS ER 20 MEQ PO TBCR
40.0000 meq | EXTENDED_RELEASE_TABLET | Freq: Once | ORAL | Status: AC
  Administered 2015-01-10: 40 meq via ORAL
  Filled 2015-01-10: qty 2

## 2015-01-10 MED ORDER — SODIUM CHLORIDE 0.9 % IV BOLUS (SEPSIS)
500.0000 mL | Freq: Once | INTRAVENOUS | Status: AC
  Administered 2015-01-10: 500 mL via INTRAVENOUS

## 2015-01-10 MED ORDER — POTASSIUM CHLORIDE CRYS ER 20 MEQ PO TBCR: 40.0000 meq | EXTENDED_RELEASE_TABLET | Freq: Once | ORAL | Status: DC

## 2015-01-10 MED ORDER — DICYCLOMINE HCL 20 MG PO TABS: 20.0000 mg | ORAL_TABLET | Freq: Four times a day (QID) | ORAL | Status: DC | PRN

## 2015-01-10 NOTE — ED Notes (Signed)
Attempted several times to call patients son Charlotte Crumb at [phone], the only number we have listed for him in the computer. Number not working. Offered for patient to use our phones to call someone else. Patient stated she did not know any numbers by memory. Patient left ED walking.

## 2015-01-10 NOTE — ED Provider Notes (Signed)
CSN: [id number]     Arrival date & time 01/10/15  1632 History   First MD Initiated Contact with Patient 01/10/15 1715     Chief Complaint  Patient presents with  . Emesis  . Diarrhea     HPI Pt was seen at 1730. Per pt, c/o gradual onset and persistence of multiple intermittent episodes of N/V/D that began 2 days ago. Describes the stools as "watery." Has been associated with generalized "cramping" abd pain. Pt states she has had these symptoms intermittently "for months and months." Pt "forgot about" her GI MD appt on 01/01/15 and rescheduled it for next week (01/14/15). Denies CP/SOB, no back pain, no fevers, no black or blood in stools or emesis.     Past Medical History  Diagnosis Date  . Essential hypertension, benign   . Type 2 diabetes mellitus   . Hypothyroidism   . Mixed hyperlipidemia   . Bipolar disorder   . Hx of colonic polyps   . Stress incontinence, female   . S/P colonoscopy Jan 2011    RMR: pancolonic diverticula, polypoid rectal mucosa with  prominent lymphoid aggregates, repeat in Jan 2016   . Irritable bowel syndrome   . PTSD (post-traumatic stress disorder)   . Shortness of breath dyspnea   . COPD (chronic obstructive pulmonary disease)   . Arthritis   . Diverticulosis   . IBS (irritable bowel syndrome)   . Tobacco use    Past Surgical History  Procedure Laterality Date  . Bilateral tubal ligation    . Colonoscopy  11/17/2009    normal rectum/  . Esophagogastroduodenoscopy  11/23/2011    RMR: Erosive reflux esophagitis; small hiatal hernia; esophagus dilated empirically  . Maloney dilation  11/23/2011    Procedure: Venia Minks DILATION;  Surgeon: Daneil Dolin, MD;  Location: [facility];  Service: Endoscopy;  Laterality: N/A;  . Cholecystectomy  09/11/2012    Procedure: LAPAROSCOPIC CHOLECYSTECTOMY;  Surgeon: Jamesetta So, MD;  Location: [facility];  Service: General;  Laterality: N/A;  . Tubal ligation    . Breast biopsy Left 10/16/2014    Procedure: LEFT  BREAST BIOPSY AFTER NEEDLE LOCALIZATION;  Surgeon: Jamesetta So, MD;  Location: [facility];  Service: General;  Laterality: Left;   Family History  Problem Relation Age of Onset  . Coronary artery disease    . Diabetes type II    . Schizophrenia Brother   . Stroke Brother   . Cancer Mother     unsure what type  . Heart disease Father   . Heart attack Father   . Colon cancer Neg Hx    History  Substance Use Topics  . Smoking status: Current Every Day Smoker -- 2.00 packs/day for 31 years    Types: Cigarettes  . Smokeless tobacco: Never Used     Comment: since age 60  . Alcohol Use: No    Review of Systems ROS: Statement: All systems negative except as marked or noted in the HPI; Constitutional: Negative for fever and chills. ; ; Eyes: Negative for eye pain, redness and discharge. ; ; ENMT: Negative for ear pain, hoarseness, nasal congestion, sinus pressure and sore throat. ; ; Cardiovascular: Negative for chest pain, palpitations, diaphoresis, dyspnea and peripheral edema. ; ; Respiratory: Negative for cough, wheezing and stridor. ; ; Gastrointestinal: +N/V/D, abd pain. Negative for blood in stool, hematemesis, jaundice and rectal bleeding. . ; ; Genitourinary: Negative for dysuria, flank pain and hematuria. ; ; Musculoskeletal: Negative for back  pain and neck pain. Negative for swelling and trauma.; ; Skin: Negative for pruritus, rash, abrasions, blisters, bruising and skin lesion.; ; Neuro: Negative for headache, lightheadedness and neck stiffness. Negative for weakness, altered level of consciousness , altered mental status, extremity weakness, paresthesias, involuntary movement, seizure and syncope.      Allergies  Review of patient's allergies indicates no active allergies.  Home Medications   Prior to Admission medications   Medication Sig Start Date End Date Taking? Authorizing Provider  albuterol (PROVENTIL HFA;VENTOLIN HFA) 108 (90 BASE) MCG/ACT inhaler Inhale 2 puffs into  the lungs every 6 (six) hours as needed. For shortness of breath     Historical Provider, MD  amLODipine (NORVASC) 5 MG tablet Take 5 mg by mouth daily.    Historical Provider, MD  aspirin 81 MG EC tablet Take 81 mg by mouth daily.      Historical Provider, MD  cetirizine (ZYRTEC) 10 MG tablet Take 10 mg by mouth daily.     Historical Provider, MD  clonazePAM (KLONOPIN) 1 MG tablet Take 1 mg by mouth at bedtime as needed for anxiety.    Historical Provider, MD  furosemide (LASIX) 20 MG tablet Take 20 mg by mouth daily.     Historical Provider, MD  gabapentin (NEURONTIN) 100 MG capsule Take 100 mg by mouth 3 (three) times daily.     Historical Provider, MD  HYDROcodone-acetaminophen (NORCO/VICODIN) 5-325 MG per tablet Take 1 tablet by mouth every 4 (four) hours as needed for moderate pain. 10/16/14 10/16/15  Jamesetta So, MD  insulin glargine (LANTUS) 100 UNIT/ML injection Inject 80 Units into the skin at bedtime as needed. If needed for blood sugar control.    Historical Provider, MD  insulin lispro (HUMALOG) 100 UNIT/ML injection Inject 10-30 Units into the skin 3 (three) times daily as needed for high blood sugar (Uses according to a sliding scale at home).    Historical Provider, MD  levothyroxine (SYNTHROID, LEVOTHROID) 88 MCG tablet Take 88 mcg by mouth daily before breakfast.    Historical Provider, MD  linagliptin (TRADJENTA) 5 MG TABS tablet Take 5 mg by mouth daily.    Historical Provider, MD  PARoxetine (PAXIL) 40 MG tablet Take 40 mg by mouth daily.    Historical Provider, MD  simvastatin (ZOCOR) 40 MG tablet Take 40 mg by mouth every evening.    Historical Provider, MD  traZODone (DESYREL) 50 MG tablet Take 50 mg by mouth at bedtime.    Historical Provider, MD   BP 129/93 mmHg  Pulse 81  Temp(Src) 98.2 F (36.8 C) (Oral)  Resp 20  Ht 5\' 5"  (1.651 m)  Wt 211 lb (95.709 kg)  BMI 35.11 kg/m2  SpO2 92% Physical Exam  1735: Physical examination:  Nursing notes reviewed; Vital signs  and O2 SAT reviewed;  Constitutional: Well developed, Well nourished, Well hydrated, In no acute distress; Head:  Normocephalic, atraumatic; Eyes: EOMI, PERRL, No scleral icterus; ENMT: Mouth and pharynx normal, Mucous membranes moist; Neck: Supple, Full range of motion, No lymphadenopathy; Cardiovascular: Regular rate and rhythm, No gallop; Respiratory: Breath sounds clear & equal bilaterally, No rales, rhonchi, wheezes.  Speaking full sentences with ease, Normal respiratory effort/excursion; Chest: Nontender, Movement normal; Abdomen: Soft, +mild diffuse tenderness to palp. No rebound or guarding. Nondistended, Normal bowel sounds; Genitourinary: No CVA tenderness; Extremities: Pulses normal, No tenderness, No edema, No calf edema or asymmetry.; Neuro: AA&Ox3, Major CN grossly intact.  Speech clear. No gross focal motor or sensory deficits in  extremities.; Skin: Color normal, Warm, Dry.   ED Course  Procedures     EKG Interpretation None      MDM  MDM Reviewed: previous chart, nursing note and vitals Reviewed previous: labs Interpretation: labs and CT scan   Results for orders placed or performed during the hospital encounter of 01/10/15  CBC with Differential  Result Value Ref Range   WBC 10.6 (H) 4.0 - 10.5 K/uL   RBC 5.08 3.87 - 5.11 MIL/uL   Hemoglobin 15.8 (H) 12.0 - 15.0 g/dL   HCT 45.6 36.0 - 46.0 %   MCV 89.8 78.0 - 100.0 fL   MCH 31.1 26.0 - 34.0 pg   MCHC 34.6 30.0 - 36.0 g/dL   RDW 13.7 11.5 - 15.5 %   Platelets 172 150 - 400 K/uL   Neutrophils Relative % 65 43 - 77 %   Neutro Abs 6.9 1.7 - 7.7 K/uL   Lymphocytes Relative 27 12 - 46 %   Lymphs Abs 2.8 0.7 - 4.0 K/uL   Monocytes Relative 7 3 - 12 %   Monocytes Absolute 0.8 0.1 - 1.0 K/uL   Eosinophils Relative 1 0 - 5 %   Eosinophils Absolute 0.1 0.0 - 0.7 K/uL   Basophils Relative 0 0 - 1 %   Basophils Absolute 0.0 0.0 - 0.1 K/uL  Comprehensive metabolic panel  Result Value Ref Range   Sodium 138 135 - 145  mmol/L   Potassium 3.4 (L) 3.5 - 5.1 mmol/L   Chloride 102 96 - 112 mmol/L   CO2 28 19 - 32 mmol/L   Glucose, Bld 175 (H) 70 - 99 mg/dL   BUN 17 6 - 23 mg/dL   Creatinine, Ser 1.09 0.50 - 1.10 mg/dL   Calcium 9.5 8.4 - 10.5 mg/dL   Total Protein 6.7 6.0 - 8.3 g/dL   Albumin 3.9 3.5 - 5.2 g/dL   AST 30 0 - 37 U/L   ALT 35 0 - 35 U/L   Alkaline Phosphatase 105 39 - 117 U/L   Total Bilirubin 0.7 0.3 - 1.2 mg/dL   GFR calc non Af Amer 53 (L) >90 mL/min   GFR calc Af Amer 62 (L) >90 mL/min   Anion gap 8 5 - 15  Lipase, blood  Result Value Ref Range   Lipase 20 11 - 59 U/L  Urinalysis, Routine w reflex microscopic  Result Value Ref Range   Color, Urine YELLOW YELLOW   APPearance CLEAR CLEAR   Specific Gravity, Urine 1.020 1.005 - 1.030   pH 6.0 5.0 - 8.0   Glucose, UA NEGATIVE NEGATIVE mg/dL   Hgb urine dipstick NEGATIVE NEGATIVE   Bilirubin Urine NEGATIVE NEGATIVE   Ketones, ur NEGATIVE NEGATIVE mg/dL   Protein, ur NEGATIVE NEGATIVE mg/dL   Urobilinogen, UA 1.0 0.0 - 1.0 mg/dL   Nitrite POSITIVE (A) NEGATIVE   Leukocytes, UA TRACE (A) NEGATIVE  Urine microscopic-add on  Result Value Ref Range   Squamous Epithelial / LPF MANY (A) RARE   WBC, UA 11-20 <3 WBC/hpf   Bacteria, UA MANY (A) RARE   Ct Abdomen Pelvis W Contrast 01/10/2015   CLINICAL DATA:  Acute onset of vomiting and diarrhea. Initial encounter.  EXAM: CT ABDOMEN AND PELVIS WITH CONTRAST  TECHNIQUE: Multidetector CT imaging of the abdomen and pelvis was performed using the standard protocol following bolus administration of intravenous contrast.  CONTRAST:  133mL OMNIPAQUE IOHEXOL 300 MG/ML  SOLN  COMPARISON:  CT of the abdomen and pelvis  from 09/09/2012  FINDINGS: The visualized lung bases are clear. Trace pericardial fluid remains within normal limits.  Scattered calcified granulomata are seen within the liver and spleen. The liver and spleen are otherwise unremarkable. The patient is status post cholecystectomy, with  clips noted along the gallbladder fossa. The pancreas and adrenal glands are unremarkable.  The kidneys are unremarkable in appearance. There is no evidence of hydronephrosis. No renal or ureteral stones are seen. No perinephric stranding is appreciated.  No free fluid is identified. The small bowel is unremarkable in appearance. The stomach is within normal limits. No acute vascular abnormalities are seen. Scattered calcification is seen along the abdominal aorta and its branches, with likely mild luminal narrowing at the aortic bifurcation.  The appendix is normal in caliber, without evidence for appendicitis. Contrast progresses to the level of the cecum. The colon is partially filled with stool. Scattered diverticulosis is noted along the sigmoid colon, without evidence of diverticulitis.  The bladder is decompressed and not well assessed. The uterus is grossly unremarkable in appearance. The ovaries are relatively symmetric. No suspicious adnexal masses are seen. No inguinal lymphadenopathy is seen.  No acute osseous abnormalities are identified. Vacuum phenomenon is noted at L5-S1, with associated endplate sclerosis.  IMPRESSION: 1. No acute abnormality seen within the abdomen or pelvis. 2. Scattered diverticulosis along the sigmoid colon, without evidence of diverticulitis. 3. Scattered calcification along the abdominal aorta and its branches, with likely mild luminal narrowing at the aortic bifurcation.   Electronically Signed   By: Garald Balding M.D.   On: 01/10/2015 19:28    2130:   Potassium repleted PO. Pt has tol PO well while in the ED without N/V after zofran. No stooling while in the ED.  Abd benign, VSS. Has ambulated with steady upright gait, easy resps, NAD. Feels better and wants to go home now. Will tx for UTI with rx keflex, dose IV rocephin while in the ED. Dx and testing d/w pt.  Questions answered.  Verb understanding, agreeable to d/c home with outpt f/u.       Francine Graven,  DO 01/12/15 2123

## 2015-01-10 NOTE — ED Notes (Signed)
Patient complaining of vomiting and diarrhea off and on for months with this episode starting yesterday. Patient denies abdominal pain at this time. States "I thought my appointment with the gastroenterologist was today but it's not until next week and this has to stop."

## 2015-01-10 NOTE — Discharge Instructions (Signed)
°Emergency Department Resource Guide °1) Find a Doctor and Pay Out of Pocket °Although you won't have to find out who is covered by your insurance plan, it is a good idea to ask around and get recommendations. You will then need to call the office and see if the doctor you have chosen will accept you as a new patient and what types of options they offer for patients who are self-pay. Some doctors offer discounts or will set up payment plans for their patients who do not have insurance, but you will need to ask so you aren't surprised when you get to your appointment. ° °2) Contact Your Local Health Department °Not all health departments have doctors that can see patients for sick visits, but many do, so it is worth a call to see if yours does. If you don't know where your local health department is, you can check in your phone book. The CDC also has a tool to help you locate your state's health department, and many state websites also have listings of all of their local health departments. ° °3) Find a Walk-in Clinic °If your illness is not likely to be very severe or complicated, you may want to try a walk in clinic. These are popping up all over the country in pharmacies, drugstores, and shopping centers. They're usually staffed by nurse practitioners or physician assistants that have been trained to treat common illnesses and complaints. They're usually fairly quick and inexpensive. However, if you have serious medical issues or chronic medical problems, these are probably not your best option. ° °No Primary Care Doctor: °- Call [facility] at  832-8000 - they can help you locate a primary care doctor that  accepts your insurance, provides certain services, etc. °- Physician Referral Service- 1-800-533-3463 ° °Chronic Pain Problems: °Organization         Address  Phone   Notes  °[facility]  (336) 297-2271 Patients need to be referred by their primary care doctor.  ° °Medication  Assistance: °Organization         Address  Phone   Notes  °[place] [facility] [address] °[place], [place] [address] (336) 641-8030 --Must be a resident of [place] °-- Must have NO insurance coverage whatsoever (no Medicaid/ Medicare, etc.) °-- The pt. MUST have a primary care doctor that directs their care regularly and follows them in the community °  °[facility]  (866) 331-1348   °[employer]  (888) 892-1162   ° °Agencies that provide inexpensive medical care: °Organization         Address  Phone   Notes  °[facility]  (336) 832-8035   ° Internal Medicine    (336) 832-7272   °[facility] [address] °[place], [place] [address] (336) 832-4777   °[facility] [address], °[place] (336) 271-4999   °[facility]    (336) 373-0678   °[facility]    (336) 272-1050   °[facility] ° [address], [place] Phone:  (336) 832-4444, Fax:  (336) 832-4440 Hours of Operation:  9 am - 6 pm, M-F.  Also accepts Medicaid/Medicare and self-pay.  °[facility] ° [address], [place] Phone: (336) 832-3150, Fax: (336) [phone]. Hours of Operation:  8:30 am - 5:30 pm, M-F.  Also accepts Medicaid and self-pay.  °[facility] [place] [address], [place] Phone: (336) 878-6027   °[facility] [address], [place], [place] (336)723-1848, Ext. 123 Mondays & Thursdays: 7-9 AM.  First 15 patients are seen on a first come, first serve basis. °  ° °Medicaid-accepting [place] Providers: ° °Organization         Address  Phone   Notes  °[facility] [address], [place] (336) 641-2100 Also accepts self-pay patients.  °[facility] [address], [place] ° (336) 856-9996   °[facility] [address], [place]  (336) 288-8857   °[facility] [address] [place] [address], [place] (336) 299-7000   °Veita Bland [address], [place]  ° (336) 373-1557 Only accepts [place] Access Medicaid patients after they have their name applied to their card.  ° °Self-Pay (no insurance) in [place]: ° °Organization         Address  Phone   Notes  °Sickle Cell Patients, [facility] [address], [place] (336) 832-1970   °[facility] [address], [place] (336) 832-4400   °[facility] ° [address], [place] (336) 992-4800   °[facility]/Dr. Osei-Bonsu ° [address] [place] [address], [place] or [address], [place] (336) [phone] Phone number for both [place] and [place] locations is the same.  °[facility] [address], [place] (336) 299-0000   °[facility] [address] [place] [address], [place] or [address] (336) 852-7530 °(336) 878-2260   °[facility] [address], [place] (336) 350-1642, phone; (336) 294-5005, fax Sees patients 1st and 3rd Saturday of every month.  Must not qualify for public or private insurance (i.e. Medicaid, Medicare, [place] Health Choice, Veterans' Benefits) • Household income should be no more than 200% of the poverty level •The clinic cannot treat you if you are pregnant or think you are pregnant • Sexually transmitted diseases are not treated at the clinic.  ° ° °Dental Care: °Organization         Address  Phone  Notes  °[place] [facility] [address], [place] (336) 641-6152 Accepts children up to age 21 who are enrolled in Medicaid or [place] Health Choice; pregnant women with a Medicaid card; and children who have applied for Medicaid or [place] Health Choice, but were declined, whose parents can pay a reduced fee at time of service.  °[place]  [facility] [place]  [address], [place] (336) 641-7733 Accepts children up to age 21 who are enrolled in Medicaid or [place] Health Choice; pregnant women with a Medicaid card; and children who have applied for Medicaid or  Health Choice, but were declined, whose parents can pay a reduced fee at time of service.  °[facility] ° [address],  (336) 641-4533 Patients are seen by appointment only. Walk-ins are not accepted. [facility] will see patients 18 years of age and older. °Monday - Tuesday (8am-5pm) °Most Wednesdays (8:30-5pm) °$30 per visit, cash only  °[facility] ° [address], [place] (336) 641-4533 Patients are seen by appointment only. Walk-ins are not accepted. [facility] will see patients 18 years of age and older. °One   Wednesday Evening (Monthly: Volunteer Based).  $30 per visit, cash only  °[facility]  (919) 537-3737 for adults; Children under age 4, call Graduate Pediatric Dentistry at ([phone]) [phone]. Children aged 4-14, please call ([phone]) [phone] to request a pediatric application. ° Dental services are provided in all areas of dental care including fillings, crowns and bridges, complete and partial dentures, implants, gum treatment, root canals, and extractions. Preventive care is also provided. Treatment is provided to both adults and children. °Patients are selected via a lottery and there is often a waiting list. °  °[facility] [address], °[place] ° (336) 763-8833 [email or web address].[email or web address] °  °[facility] [address], [place], [place] (336)723-1848, Ext. 123 Second and Fourth Thursday of each month, opens at 6:30 AM; Clinic ends at 9 AM.  Patients are seen on a first-come first-served basis, and a limited number are seen during each clinic.  ° °[facility] ° [address], [place], [place] (336) [phone]    Eligibility Requirements °You must have lived in [place], or [place] counties for at least the last three months. °  You cannot be eligible for state or federal sponsored healthcare insurance, including Veterans Administration, Medicaid, or Medicare. °  You generally cannot be eligible for healthcare insurance through your employer.  °  How to apply: °Eligibility screenings are held every Tuesday and Wednesday afternoon from 1:00 pm until 4:00 pm. You do not need an appointment for the interview!  °[facility] [address], [place], [place] 336-631-2330   °[facility]  336-342-8273   °[facility]  336-703-3100   °[facility]  336-570-6415   ° °Behavioral Health Resources in the Community: °Intensive Outpatient Programs °Organization         Address  Phone  Notes  °[facility] [address], [place], [place] 336-878-6098   °[facility] [address], [place], [place] 336-832-9800   °[facility] [address], [place], [place] ° 336-882-2125   °[facility] [address],  °[place], [place] 1-800-853-5163 or 336-641-4981   °Substance Abuse Resources °Organization         Address  Phone  Notes  °[facility]  336-882-2125   °[facility]  336-784-9470   °[facility]  336-285-9073   °[facility]  336-845-3988   °Residential & Outpatient Substance Abuse Program  1-800-659-3381   °Psychological Services °Organization         Address  Phone  Notes  °[facility]  336- 832-9600   °[facility]  336- 378-7881   °[facility] [address], [place] 1-800-853-5163 or 336-641-4981   ° °Mobile Crisis Teams °Organization         Address  Phone  Notes  °[facility], Mobile Crisis Care Unit  1-877-626-1772   °Assertive °[facility] ° [address]  [place], [place] 336-834-9664   °Sharon DeEsch [address] °[place] 336-554-5454   ° °Self-Help/Support Groups °Organization         Address  Phone             Notes  °[facility] - variety of support groups  336- 373-1402 Call for more information  °Narcotics Anonymous (NA), [facility] [address], °[place]  2 meetings at this location  ° °  Residential Treatment Programs Organization         Address  Phone  Notes  [facility] [address]    [place]  1-413-208-3211   [facility]  [address], [place] [address], [place], [place]   [facility] [place], [place] 907-544-3386 Admissions: 8am-3pm M-F  [facility] [place] [address],    [place], [place] [phone]   [facility] [address] [place], [place], [place]   [facility] [address]  [place], [place]   [facility] Intensive Outpatient [address], [place], [place]   [facility] [address]  [place], [place] 1-626-368-9082 or (709) 330-8217   [facility] [address] [place], [place] Accepts Medicaid  [facility] [address]  [place] 1-(704)715-8261 Substance Abuse/Addiction Treatment   [facility]         Address  Phone  Notes  [facility]  (716)138-6145   Domenic Schwab, PhD [address] [place], [place]   3094250002 or 250-589-2891   [facility] [place], [place] [phone]   [facility] [address], [place], [place] 863-024-8982 Insurance/Medicaid/sponsorship through [facility] [place] and Families [address]                                    [place], [place] [phone] [place] [address], [place] ([phone]    Dr. Adele Schilder  [phone]   [facility] Dept. 1) [address] [place] 2) [place] 3)  [place] [address], [place] [phone]  ([phone]   [place] 463-795-9251 or ([phone] (After Hours)      Take the prescriptions as directed.  Increase your fluid intake (ie:  Gatoraide) for the next few days, as discussed.  Eat a bland diet and advance to your regular diet slowly as you can tolerate it.   Avoid full strength juices, as well as milk and milk products until your diarrhea has resolved.   Call your regular GI doctor on Monday to confirm your previously scheduled follow up appointment on Tuesday (on 01/14/15).  Return to the Emergency Department immediately if not improving (or even worsening) despite taking the medicines as prescribed, any black or bloody stool or vomit, if you develop a fever over "101," or for any other concerns.

## 2015-01-10 NOTE — ED Notes (Signed)
Patient drank 240cc of water, no recurrent nausea

## 2015-01-10 NOTE — ED Notes (Signed)
Patient vomiting, has drank 1 bottle of contrast, pt. Given zofran.

## 2015-01-14 ENCOUNTER — Ambulatory Visit (INDEPENDENT_AMBULATORY_CARE_PROVIDER_SITE_OTHER): Payer: Medicaid Other | Admitting: Gastroenterology

## 2015-01-14 ENCOUNTER — Other Ambulatory Visit: Payer: Self-pay

## 2015-01-14 ENCOUNTER — Encounter: Payer: Self-pay | Admitting: Gastroenterology

## 2015-01-14 VITALS — BP 142/75 | HR 109 | Temp 97.9°F | Ht 65.0 in | Wt 217.0 lb

## 2015-01-14 DIAGNOSIS — Z86010 Personal history of colonic polyps: Secondary | ICD-10-CM

## 2015-01-14 DIAGNOSIS — R197 Diarrhea, unspecified: Secondary | ICD-10-CM

## 2015-01-14 MED ORDER — PEG-KCL-NACL-NASULF-NA ASC-C 100 G PO SOLR: 1.0000 | Freq: Once | ORAL | Status: AC

## 2015-01-14 MED ORDER — SACCHAROMYCES BOULARDII 250 MG PO CAPS: 250.0000 mg | ORAL_CAPSULE | Freq: Two times a day (BID) | ORAL | Status: DC

## 2015-01-14 NOTE — Patient Instructions (Signed)
I have sent in Florastor (a probiotic) to your pharmacy to take twice a day to your pharmacy. If this is not covered, we will try to see other options.   Start taking Metamucil or Benefiber daily.   We have scheduled you for a routine colonoscopy due to your history of colon polyps.

## 2015-01-14 NOTE — Progress Notes (Signed)
    Primary Care Physician:  FANTA,TESFAYE, MD Primary Gastroenterologist:  Dr. Rourk   Chief Complaint  Patient presents with  . Diarrhea  . Constipation  . set up colonscopy    HPI:   Claudia Keller is a 63 y.o. female presenting today to set up surveillance colonoscopy due to history of colon polyps. Last colonoscopy in 2011 with benign polyps, no adenomas. Stool studies benign at that time.  History of colon polyps prior to this in the remote past.  N/V/D with presentation to ED a few days ago. "Holding it together".   Lots of trouble with stomach. States anytime someone is around her talking about something "serious", stomach starts rolling. Explosive diarrhea. Doesn't have loose stool every day. Has associated abdominal cramping when stomach acts up, then explosive loose stool. Sometimes postprandial. Prescribed Bentyl from ED on 3/4 with some improvement. Not taking extra fiber or probiotics. States when she does take the probiotics, it helps significantly. Some months unable to afford.   Denies reflux, dysphagia. N/V resolved. Attributes abdominal cramping and diarrhea with stress. Prior history of chronic diarrhea.   Past Medical History  Diagnosis Date  . Essential hypertension, benign   . Type 2 diabetes mellitus   . Hypothyroidism   . Mixed hyperlipidemia   . Bipolar disorder   . Hx of colonic polyps   . Stress incontinence, female   . S/P colonoscopy Jan 2011    RMR: pancolonic diverticula, polypoid rectal mucosa with  prominent lymphoid aggregates, repeat in Jan 2016   . Irritable bowel syndrome   . PTSD (post-traumatic stress disorder)   . Shortness of breath dyspnea   . COPD (chronic obstructive pulmonary disease)   . Arthritis   . Diverticulosis   . IBS (irritable bowel syndrome)   . Tobacco use     Past Surgical History  Procedure Laterality Date  . Bilateral tubal ligation    . Colonoscopy  11/17/2009    Dr. Rourk: normal rectum, pancolonic  diverticula, polyps benign, no adenomas.   . Esophagogastroduodenoscopy  11/23/2011    Dr. Rourk: Erosive reflux esophagitis; small hiatal hernia; esophagus dilated empirically  . Maloney dilation  11/23/2011    Procedure: MALONEY DILATION;  Surgeon: Robert M Rourk, MD;  Location[facility];  Service: Endoscopy;  Laterality: N/A;  . Cholecystectomy  09/11/2012    Procedure: LAPAROSCOPIC CHOLECYSTECTOMY;  Surgeon: Mark A Jenkins, MD;  Location: [facility] ORS;  Service: General;  Laterality: N/A;  . Tubal ligation    . Breast biopsy Left 10/16/2014    negative    Current Outpatient Prescriptions  Medication Sig Dispense Refill  . albuterol (PROVENTIL HFA;VENTOLIN HFA) 108 (90 BASE) MCG/ACT inhaler Inhale 2 puffs into the lungs every 6 (six) hours as needed. For shortness of breath     . amLODipine (NORVASC) 5 MG tablet Take 5 mg by mouth daily.    . aspirin 81 MG EC tablet Take 81 mg by mouth daily.      . cephALEXin (KEFLEX) 500 MG capsule Take 1 capsule (500 mg total) by mouth 4 (four) times daily. 40 capsule 0  . cetirizine (ZYRTEC) 10 MG tablet Take 10 mg by mouth daily.     . clonazePAM (KLONOPIN) 1 MG tablet Take 1 mg by mouth at bedtime as needed for anxiety.    . dicyclomine (BENTYL) 20 MG tablet Take 1 tablet (20 mg total) by mouth every 6 (six) hours as needed for spasms (abdominal cramping). 15 tablet 0  .   furosemide (LASIX) 20 MG tablet Take 20 mg by mouth daily.     . gabapentin (NEURONTIN) 100 MG capsule Take 100 mg by mouth 3 (three) times daily.     . HYDROcodone-acetaminophen (NORCO/VICODIN) 5-325 MG per tablet Take 1 tablet by mouth every 4 (four) hours as needed for moderate pain. 40 tablet 0  . insulin glargine (LANTUS) 100 UNIT/ML injection Inject 80 Units into the skin at bedtime as needed. If needed for blood sugar control.    . insulin lispro (HUMALOG) 100 UNIT/ML injection Inject 10-30 Units into the skin 3 (three) times daily as needed for high blood sugar (Uses according to  a sliding scale at home).    . levothyroxine (SYNTHROID, LEVOTHROID) 88 MCG tablet Take 88 mcg by mouth daily before breakfast.    . linagliptin (TRADJENTA) 5 MG TABS tablet Take 5 mg by mouth daily.    . ondansetron (ZOFRAN) 4 MG tablet Take 1 tablet (4 mg total) by mouth every 8 (eight) hours as needed for nausea or vomiting. 6 tablet 0  . PARoxetine (PAXIL) 40 MG tablet Take 40 mg by mouth daily.    . simvastatin (ZOCOR) 40 MG tablet Take 40 mg by mouth every evening.    . traZODone (DESYREL) 50 MG tablet Take 50 mg by mouth at bedtime.    . peg 3350 powder (MOVIPREP) 100 G SOLR Take 1 kit (200 g total) by mouth once. 1 kit 0  . saccharomyces boulardii (FLORASTOR) 250 MG capsule Take 1 capsule (250 mg total) by mouth 2 (two) times daily. 60 capsule 5   No current facility-administered medications for this visit.    Allergies as of 01/14/2015  . (No Active Allergies)    Family History  Problem Relation Age of Onset  . Coronary artery disease    . Diabetes type II    . Schizophrenia Brother   . Stroke Brother   . Cancer Mother     unsure what type  . Heart disease Father   . Heart attack Father   . Colon cancer Neg Hx     History   Social History  . Marital Status: Legally Separated    Spouse Name: N/A  . Number of Children: N/A  . Years of Education: N/A   Occupational History  . Not on file.   Social History Main Topics  . Smoking status: Current Every Day Smoker -- 2.00 packs/day for 31 years    Types: Cigarettes  . Smokeless tobacco: Never Used     Comment: since age 31  . Alcohol Use: No  . Drug Use: No  . Sexual Activity: Yes    Birth Control/ Protection: Surgical   Other Topics Concern  . Not on file   Social History Narrative    Review of Systems: As mentioned in HPI.   Physical Exam: BP 142/75 mmHg  Pulse 109  Temp(Src) 97.9 F (36.6 C)  Ht 5' 5" (1.651 m)  Wt 217 lb (98.431 kg)  BMI 36.11 kg/m2 General:   Alert and oriented. Pleasant and  cooperative. Well-nourished and well-developed.  Head:  Normocephalic and atraumatic. Eyes:  Without icterus, sclera clear and conjunctiva pink.  Ears:  Normal auditory acuity. Nose:  No deformity, discharge,  or lesions. Mouth:   oral mucosa pink.  Lungs:  Clear to auscultation bilaterally. No wheezes, rales, or rhonchi. No distress.  Heart:  S1, S2 present without murmurs appreciated.  Abdomen:  +BS, soft, non-tender and non-distended. No HSM noted. No   guarding or rebound. No masses appreciated.  Rectal:  Deferred  Msk:  Symmetrical without gross deformities. Normal posture. Extremities:  Without edema. Neurologic:  Alert and  oriented x4;  grossly normal neurologically. Skin:  Intact without significant lesions or rashes. Psych:  Alert and cooperative. Normal mood and affect.    

## 2015-01-15 ENCOUNTER — Encounter: Payer: Self-pay | Admitting: Gastroenterology

## 2015-01-15 ENCOUNTER — Telehealth: Payer: Self-pay | Admitting: Internal Medicine

## 2015-01-15 NOTE — Telephone Encounter (Signed)
Yes, purchase OTC.

## 2015-01-15 NOTE — Telephone Encounter (Signed)
Pt called today to say that she had seen [facility] yesterday and was given a probiotic. She said that her medicaid doesn't cover it and it will cost her $20 for 30 pills.  She wants something else cheaper. She uses [employer]. Please advise and call her back at [phone]

## 2015-01-15 NOTE — Assessment & Plan Note (Signed)
64 year old female with history of colon polyps, with last colonoscopy in 2011. Due now for routine surveillance. No concerning lower GI symptoms, rectal bleeding.   Proceed with TCS with Dr. Gala Romney in near future: the risks, benefits, and alternatives have been discussed with the patient in detail. The patient states understanding and desires to proceed. Phenergan 12.5 mg IV on call due to polypharmacy

## 2015-01-15 NOTE — Assessment & Plan Note (Signed)
Chronic diarrhea, now with intermittent episodes associated with abdominal pain, triggered by stress and anxiety. Thorough work-up in the past, with stress playing a large role in this scenario. Improvement noted in the past with a probiotic. Will send in florastor to take BID in hopes of insurance coverage. May use Bentyl prn. Colonoscopy as planned due to history of colon polyps.

## 2015-01-15 NOTE — Telephone Encounter (Signed)
Pt is aware.  

## 2015-01-15 NOTE — Telephone Encounter (Signed)
Vicente Males, do you want her to buy it otc?

## 2015-01-16 NOTE — Progress Notes (Signed)
cc'ed to pcp °

## 2015-02-12 ENCOUNTER — Encounter (HOSPITAL_COMMUNITY): Payer: Self-pay | Admitting: *Deleted

## 2015-02-12 ENCOUNTER — Telehealth: Payer: Self-pay

## 2015-02-12 ENCOUNTER — Encounter (HOSPITAL_COMMUNITY)
Admission: RE | Disposition: A | Discharge status: Home Health | Payer: Self-pay | Source: Ambulatory Visit | Attending: Internal Medicine

## 2015-02-12 ENCOUNTER — Ambulatory Visit (HOSPITAL_COMMUNITY)
Admission: RE | Admit: 2015-02-12 | Discharge: 2015-02-12 | Disposition: A | Discharge status: Home Health | Payer: Medicaid Other | Source: Ambulatory Visit | Attending: Internal Medicine | Admitting: Internal Medicine

## 2015-02-12 DIAGNOSIS — K5900 Constipation, unspecified: Secondary | ICD-10-CM | POA: Insufficient documentation

## 2015-02-12 DIAGNOSIS — D122 Benign neoplasm of ascending colon: Secondary | ICD-10-CM

## 2015-02-12 DIAGNOSIS — Z9049 Acquired absence of other specified parts of digestive tract: Secondary | ICD-10-CM | POA: Diagnosis not present

## 2015-02-12 DIAGNOSIS — Z9851 Tubal ligation status: Secondary | ICD-10-CM | POA: Diagnosis not present

## 2015-02-12 DIAGNOSIS — Z794 Long term (current) use of insulin: Secondary | ICD-10-CM | POA: Diagnosis not present

## 2015-02-12 DIAGNOSIS — E039 Hypothyroidism, unspecified: Secondary | ICD-10-CM | POA: Insufficient documentation

## 2015-02-12 DIAGNOSIS — N393 Stress incontinence (female) (male): Secondary | ICD-10-CM | POA: Diagnosis not present

## 2015-02-12 DIAGNOSIS — Z86010 Personal history of colonic polyps: Secondary | ICD-10-CM | POA: Diagnosis not present

## 2015-02-12 DIAGNOSIS — F17210 Nicotine dependence, cigarettes, uncomplicated: Secondary | ICD-10-CM | POA: Insufficient documentation

## 2015-02-12 DIAGNOSIS — K5730 Diverticulosis of large intestine without perforation or abscess without bleeding: Secondary | ICD-10-CM | POA: Diagnosis not present

## 2015-02-12 DIAGNOSIS — J449 Chronic obstructive pulmonary disease, unspecified: Secondary | ICD-10-CM | POA: Diagnosis not present

## 2015-02-12 DIAGNOSIS — Z7982 Long term (current) use of aspirin: Secondary | ICD-10-CM | POA: Diagnosis not present

## 2015-02-12 DIAGNOSIS — E119 Type 2 diabetes mellitus without complications: Secondary | ICD-10-CM | POA: Diagnosis not present

## 2015-02-12 DIAGNOSIS — R197 Diarrhea, unspecified: Secondary | ICD-10-CM | POA: Diagnosis present

## 2015-02-12 DIAGNOSIS — F319 Bipolar disorder, unspecified: Secondary | ICD-10-CM | POA: Diagnosis not present

## 2015-02-12 DIAGNOSIS — D120 Benign neoplasm of cecum: Secondary | ICD-10-CM

## 2015-02-12 DIAGNOSIS — E782 Mixed hyperlipidemia: Secondary | ICD-10-CM | POA: Diagnosis not present

## 2015-02-12 DIAGNOSIS — I10 Essential (primary) hypertension: Secondary | ICD-10-CM | POA: Insufficient documentation

## 2015-02-12 HISTORY — PX: COLONOSCOPY: SHX5424

## 2015-02-12 LAB — GLUCOSE, CAPILLARY: Glucose-Capillary: 153 mg/dL — ABNORMAL HIGH (ref 70–99)

## 2015-02-12 SURGERY — COLONOSCOPY
Anesthesia: Moderate Sedation

## 2015-02-12 MED ORDER — MIDAZOLAM HCL 5 MG/5ML IJ SOLN
INTRAMUSCULAR | Status: AC
  Filled 2015-02-12: qty 10

## 2015-02-12 MED ORDER — ONDANSETRON HCL 4 MG/2ML IJ SOLN
INTRAMUSCULAR | Status: DC | PRN
  Administered 2015-02-12: 4 mg via INTRAVENOUS

## 2015-02-12 MED ORDER — SIMETHICONE 40 MG/0.6ML PO SUSP
ORAL | Status: DC | PRN
  Administered 2015-02-12: 10:00:00

## 2015-02-12 MED ORDER — PROMETHAZINE HCL 25 MG/ML IJ SOLN
12.5000 mg | Freq: Once | INTRAMUSCULAR | Status: AC
  Administered 2015-02-12: 12.5 mg via INTRAVENOUS
  Filled 2015-02-12: qty 1

## 2015-02-12 MED ORDER — ONDANSETRON HCL 4 MG/2ML IJ SOLN
INTRAMUSCULAR | Status: AC
  Filled 2015-02-12: qty 2

## 2015-02-12 MED ORDER — MEPERIDINE HCL 100 MG/ML IJ SOLN
INTRAMUSCULAR | Status: DC | PRN
  Administered 2015-02-12: 25 mg
  Administered 2015-02-12 (×2): 25 mg via INTRAVENOUS

## 2015-02-12 MED ORDER — SODIUM CHLORIDE 0.9 % IJ SOLN
INTRAMUSCULAR | Status: AC
  Filled 2015-02-12: qty 3

## 2015-02-12 MED ORDER — MEPERIDINE HCL 100 MG/ML IJ SOLN
INTRAMUSCULAR | Status: AC
  Filled 2015-02-12: qty 2

## 2015-02-12 MED ORDER — MIDAZOLAM HCL 5 MG/5ML IJ SOLN
INTRAMUSCULAR | Status: DC | PRN
  Administered 2015-02-12: 2 mg via INTRAVENOUS
  Administered 2015-02-12 (×2): 1 mg via INTRAVENOUS
  Administered 2015-02-12: 2 mg via INTRAVENOUS

## 2015-02-12 MED ORDER — SODIUM CHLORIDE 0.9 % IV SOLN
INTRAVENOUS | Status: DC
  Administered 2015-02-12: 10:00:00 via INTRAVENOUS

## 2015-02-12 NOTE — Op Note (Signed)
[facility] [address] [place], [address]   COLONOSCOPY PROCEDURE REPORT  PATIENT: Claudia Keller, Claudia Keller  MR#: [id number] BIRTHDATE: [birth year] , 18  yrs. old GENDER: female ENDOSCOPIST: R.  Garfield Cornea, MD FACP [facility] REFERRED PO:EUMPNTIR Legrand Rams, M.D. PROCEDURE DATE:  09-Mar-2015 PROCEDURE:   Ileo-colonoscopy with snare polypectomy and Colonoscopy with biopsy INDICATIONS:History of colonic adenoma; chronic diarrhea. MEDICATIONS: Versed 6 mg IV and Demerol 75 mg IV in divided doses. Phenergan 12.5 mg IV.  Zofran 4 mg IV. ASA CLASS:       Class II  CONSENT: The risks, benefits, alternatives and imponderables including but not limited to bleeding, perforation as well as the possibility of a missed lesion have been reviewed.  The potential for biopsy, lesion removal, etc. have also been discussed. Questions have been answered.  All parties agreeable.  Please see the history and physical in the medical record for more information.  DESCRIPTION OF PROCEDURE:   After the risks benefits and alternatives of the procedure were thoroughly explained, informed consent was obtained.  The digital rectal exam revealed no rectal mass and revealed no abnormalities of the rectum.   The EC-[id number] ([id number])  endoscope was introduced through the anus and advanced to the terminal ileum which was intubated for a short distance. No adverse events experienced.   The quality of the prep was adequate The instrument was then slowly withdrawn as the colon was fully examined.      COLON FINDINGS: Normal-appearing rectal mucosa.  Rectal vault small. Unable to retroflex.  Rectal mucus seen well on?"face.  Scattered pancolonic diverticula; (1) diminutive polyp in the base of the cecum; (1) 5 mm polyp in the mid ascending segment; otherwise, the remainder of the colonic mucosa appeared normal.  The distal 5 cm of terminal ileum mucosa also appeared normal.  The above-mentioned polyps were  cold biopsy and cold snare removed, respectively.  Also, segmental biopsies of the ascending as well as the descending/sigmoid segments taken for histologic study. Retroflexion was not performed. .  Withdrawal time=11 minutes 0 seconds.  The scope was withdrawn and the procedure completed. COMPLICATIONS: There were no immediate complications.  ENDOSCOPIC IMPRESSION: Colonic diverticulosis. Multiple colonic polyps?"removed as described above. Status post segmental biopsy.  RECOMMENDATIONS: Follow-up pathology.  eSigned:  R. Garfield Cornea, MD Rosalita Chessman [facility] 03-09-2015 10:56 AM   cc:  CPT CODES: ICD CODES:  The ICD and CPT codes recommended by this software are interpretations from the data that the clinical staff has captured with the software.  The verification of the translation of this report to the ICD and CPT codes and modifiers is the sole responsibility of the health care institution and practicing physician where this report was generated.  [place] will not be held responsible for the validity of the ICD and CPT codes included on this report.  AMA assumes no liability for data contained or not contained herein. CPT is a Designer, television/film set of the [employer].  PATIENT NAME:  [name], [name] MR#: [id number]

## 2015-02-12 NOTE — Telephone Encounter (Signed)
Pt is calling this morning because she is not clear after the enema. She stated that she did everything like she was told to do but is not clear. I told her to try another enema before going to the hospital. She is going to try that and call us back.

## 2015-02-12 NOTE — Interval H&P Note (Signed)
History and Physical Interval Note:  02/12/2015 10:06 AM  Claudia Keller  has presented today for surgery, with the diagnosis of history of colon polyps  The various methods of treatment have been discussed with the patient and family. After consideration of risks, benefits and other options for treatment, the patient has consented to  Procedure(s) with comments: COLONOSCOPY (N/A) - 945 - moved to 10:00 - Ginger to notify pt as a surgical intervention .  The patient's history has been reviewed, patient examined, no change in status, stable for surgery.  I have reviewed the patient's chart and labs.  Questions were answered to the patient's satisfaction.     Manus Rudd    Altering constipation diarrhea-more diarrhea lately. Colonoscopy per plan.The risks, benefits, limitations, alternatives and imponderables have been reviewed with the patient. Questions have been answered. All parties are agreeable.

## 2015-02-12 NOTE — H&P (View-Only) (Signed)
Primary Care Physician:  Rosita Fire, MD Primary Gastroenterologist:  Dr. Gala Romney   Chief Complaint  Patient presents with  . Diarrhea  . Constipation  . set up colonscopy    HPI:   Claudia Keller is a 64 y.o. female presenting today to set up surveillance colonoscopy due to history of colon polyps. Last colonoscopy in 2011 with benign polyps, no adenomas. Stool studies benign at that time.  History of colon polyps prior to this in the remote past.  N/V/D with presentation to ED a few days ago. "Holding it together".   Lots of trouble with stomach. States anytime someone is around her talking about something "serious", stomach starts rolling. Explosive diarrhea. Doesn't have loose stool every day. Has associated abdominal cramping when stomach acts up, then explosive loose stool. Sometimes postprandial. Prescribed Bentyl from ED on 3/4 with some improvement. Not taking extra fiber or probiotics. States when she does take the probiotics, it helps significantly. Some months unable to afford.   Denies reflux, dysphagia. N/V resolved. Attributes abdominal cramping and diarrhea with stress. Prior history of chronic diarrhea.   Past Medical History  Diagnosis Date  . Essential hypertension, benign   . Type 2 diabetes mellitus   . Hypothyroidism   . Mixed hyperlipidemia   . Bipolar disorder   . Hx of colonic polyps   . Stress incontinence, female   . S/P colonoscopy Jan 2011    RMR: pancolonic diverticula, polypoid rectal mucosa with  prominent lymphoid aggregates, repeat in Jan 2016   . Irritable bowel syndrome   . PTSD (post-traumatic stress disorder)   . Shortness of breath dyspnea   . COPD (chronic obstructive pulmonary disease)   . Arthritis   . Diverticulosis   . IBS (irritable bowel syndrome)   . Tobacco use     Past Surgical History  Procedure Laterality Date  . Bilateral tubal ligation    . Colonoscopy  11/17/2009    Dr. Gala Romney: normal rectum, pancolonic  diverticula, polyps benign, no adenomas.   . Esophagogastroduodenoscopy  11/23/2011    Dr. Gala Romney: Erosive reflux esophagitis; small hiatal hernia; esophagus dilated empirically  . Maloney dilation  11/23/2011    Procedure: Venia Minks DILATION;  Surgeon: Daneil Dolin, MD;  Location: [facility] SUITE;  Service: Endoscopy;  Laterality: N/A;  . Cholecystectomy  09/11/2012    Procedure: LAPAROSCOPIC CHOLECYSTECTOMY;  Surgeon: Jamesetta So, MD;  Location: [facility] ORS;  Service: General;  Laterality: N/A;  . Tubal ligation    . Breast biopsy Left 10/16/2014    negative    Current Outpatient Prescriptions  Medication Sig Dispense Refill  . albuterol (PROVENTIL HFA;VENTOLIN HFA) 108 (90 BASE) MCG/ACT inhaler Inhale 2 puffs into the lungs every 6 (six) hours as needed. For shortness of breath     . amLODipine (NORVASC) 5 MG tablet Take 5 mg by mouth daily.    Marland Kitchen aspirin 81 MG EC tablet Take 81 mg by mouth daily.      . cephALEXin (KEFLEX) 500 MG capsule Take 1 capsule (500 mg total) by mouth 4 (four) times daily. 40 capsule 0  . cetirizine (ZYRTEC) 10 MG tablet Take 10 mg by mouth daily.     . clonazePAM (KLONOPIN) 1 MG tablet Take 1 mg by mouth at bedtime as needed for anxiety.    . dicyclomine (BENTYL) 20 MG tablet Take 1 tablet (20 mg total) by mouth every 6 (six) hours as needed for spasms (abdominal cramping). 15 tablet 0  .  furosemide (LASIX) 20 MG tablet Take 20 mg by mouth daily.     Marland Kitchen gabapentin (NEURONTIN) 100 MG capsule Take 100 mg by mouth 3 (three) times daily.     Marland Kitchen HYDROcodone-acetaminophen (NORCO/VICODIN) 5-325 MG per tablet Take 1 tablet by mouth every 4 (four) hours as needed for moderate pain. 40 tablet 0  . insulin glargine (LANTUS) 100 UNIT/ML injection Inject 80 Units into the skin at bedtime as needed. If needed for blood sugar control.    . insulin lispro (HUMALOG) 100 UNIT/ML injection Inject 10-30 Units into the skin 3 (three) times daily as needed for high blood sugar (Uses according to  a sliding scale at home).    Marland Kitchen levothyroxine (SYNTHROID, LEVOTHROID) 88 MCG tablet Take 88 mcg by mouth daily before breakfast.    . linagliptin (TRADJENTA) 5 MG TABS tablet Take 5 mg by mouth daily.    . ondansetron (ZOFRAN) 4 MG tablet Take 1 tablet (4 mg total) by mouth every 8 (eight) hours as needed for nausea or vomiting. 6 tablet 0  . PARoxetine (PAXIL) 40 MG tablet Take 40 mg by mouth daily.    . simvastatin (ZOCOR) 40 MG tablet Take 40 mg by mouth every evening.    . traZODone (DESYREL) 50 MG tablet Take 50 mg by mouth at bedtime.    . peg 3350 powder (MOVIPREP) 100 G SOLR Take 1 kit (200 g total) by mouth once. 1 kit 0  . saccharomyces boulardii (FLORASTOR) 250 MG capsule Take 1 capsule (250 mg total) by mouth 2 (two) times daily. 60 capsule 5   No current facility-administered medications for this visit.    Allergies as of 01/14/2015  . (No Active Allergies)    Family History  Problem Relation Age of Onset  . Coronary artery disease    . Diabetes type II    . Schizophrenia Brother   . Stroke Brother   . Cancer Mother     unsure what type  . Heart disease Father   . Heart attack Father   . Colon cancer Neg Hx     History   Social History  . Marital Status: Legally Separated    Spouse Name: N/A  . Number of Children: N/A  . Years of Education: N/A   Occupational History  . Not on file.   Social History Main Topics  . Smoking status: Current Every Day Smoker -- 2.00 packs/day for 31 years    Types: Cigarettes  . Smokeless tobacco: Never Used     Comment: since age 71  . Alcohol Use: No  . Drug Use: No  . Sexual Activity: Yes    Birth Control/ Protection: Surgical   Other Topics Concern  . Not on file   Social History Narrative    Review of Systems: As mentioned in HPI.   Physical Exam: BP 142/75 mmHg  Pulse 109  Temp(Src) 97.9 F (36.6 C)  Ht _0  (1.651 m)  Wt 217 lb (98.431 kg)  BMI 36.11 kg/m2 General:   Alert and oriented. Pleasant and  cooperative. Well-nourished and well-developed.  Head:  Normocephalic and atraumatic. Eyes:  Without icterus, sclera clear and conjunctiva pink.  Ears:  Normal auditory acuity. Nose:  No deformity, discharge,  or lesions. Mouth:   oral mucosa pink.  Lungs:  Clear to auscultation bilaterally. No wheezes, rales, or rhonchi. No distress.  Heart:  S1, S2 present without murmurs appreciated.  Abdomen:  +BS, soft, non-tender and non-distended. No HSM noted. No  guarding or rebound. No masses appreciated.  Rectal:  Deferred  Msk:  Symmetrical without gross deformities. Normal posture. Extremities:  Without edema. Neurologic:  Alert and  oriented x4;  grossly normal neurologically. Skin:  Intact without significant lesions or rashes. Psych:  Alert and cooperative. Normal mood and affect.

## 2015-02-12 NOTE — Discharge Instructions (Signed)
Colonoscopy Discharge Instructions  Read the instructions outlined below and refer to this sheet in the next few weeks. These discharge instructions provide you with general information on caring for yourself after you leave the hospital. Your doctor may also give you specific instructions. While your treatment has been planned according to the most current medical practices available, unavoidable complications occasionally occur. If you have any problems or questions after discharge, call Dr. Gala Romney at [phone]. ACTIVITY  You may resume your regular activity, but move at a slower pace for the next 24 hours.   Take frequent rest periods for the next 24 hours.   Walking will help get rid of the air and reduce the bloated feeling in your belly (abdomen).   No driving for 24 hours (because of the medicine (anesthesia) used during the test).    Do not sign any important legal documents or operate any machinery for 24 hours (because of the anesthesia used during the test).  NUTRITION  Drink plenty of fluids.   You may resume your normal diet as instructed by your doctor.   Begin with a light meal and progress to your normal diet. Heavy or fried foods are harder to digest and may make you feel sick to your stomach (nauseated).   Avoid alcoholic beverages for 24 hours or as instructed.  MEDICATIONS  You may resume your normal medications unless your doctor tells you otherwise.  WHAT YOU CAN EXPECT TODAY  Some feelings of bloating in the abdomen.   Passage of more gas than usual.   Spotting of blood in your stool or on the toilet paper.  IF YOU HAD POLYPS REMOVED DURING THE COLONOSCOPY:  No aspirin products for 7 days or as instructed.   No alcohol for 7 days or as instructed.   Eat a soft diet for the next 24 hours.  FINDING OUT THE RESULTS OF YOUR TEST Not all test results are available during your visit. If your test results are not back during the visit, make an appointment  with your caregiver to find out the results. Do not assume everything is normal if you have not heard from your caregiver or the medical facility. It is important for you to follow up on all of your test results.  SEEK IMMEDIATE MEDICAL ATTENTION IF:  You have more than a spotting of blood in your stool.   Your belly is swollen (abdominal distention).   You are nauseated or vomiting.   You have a temperature over 101.   You have abdominal pain or discomfort that is severe or gets worse throughout the day.     You have diverticulosis and also had a couple of small polyps in your colon which I removed.  I will be in contact with you once the biopsy results are available.     Diverticulosis Diverticulosis is the condition that develops when small pouches (diverticula) form in the wall of your colon. Your colon, or large intestine, is where water is absorbed and stool is formed. The pouches form when the inside layer of your colon pushes through weak spots in the outer layers of your colon. CAUSES  No one knows exactly what causes diverticulosis. RISK FACTORS  Being older than 1. Your risk for this condition increases with age. Diverticulosis is rare in people younger than 40 years. By age 74, almost everyone has it.  Eating a low-fiber diet.  Being frequently constipated.  Being overweight.  Not getting enough exercise.  Smoking.  Taking  over-the-counter pain medicines, like aspirin and ibuprofen. SYMPTOMS  Most people with diverticulosis do not have symptoms. DIAGNOSIS  Because diverticulosis often has no symptoms, health care providers often discover the condition during an exam for other colon problems. In many cases, a health care provider will diagnose diverticulosis while using a flexible scope to examine the colon (colonoscopy). TREATMENT  If you have never developed an infection related to diverticulosis, you may not need treatment. If you have had an infection  before, treatment may include:  Eating more fruits, vegetables, and grains.  Taking a fiber supplement.  Taking a live bacteria supplement (probiotic).  Taking medicine to relax your colon. HOME CARE INSTRUCTIONS   Drink at least 6-8 glasses of water each day to prevent constipation.  Try not to strain when you have a bowel movement.  Keep all follow-up appointments. If you have had an infection before:  Increase the fiber in your diet as directed by your health care provider or dietitian.  Take a dietary fiber supplement if your health care provider approves.  Only take medicines as directed by your health care provider. SEEK MEDICAL CARE IF:   You have abdominal pain.  You have bloating.  You have cramps.  You have not gone to the bathroom in 3 days. SEEK IMMEDIATE MEDICAL CARE IF:   Your pain gets worse.  Yourbloating becomes very bad.  You have a fever or chills, and your symptoms suddenly get worse.  You begin vomiting.  You have bowel movements that are bloody or black. MAKE SURE YOU:  Understand these instructions.  Will watch your condition.  Will get help right away if you are not doing well or get worse. Document Released: 07/22/2004 Document Revised: 10/30/2013 Document Reviewed: 09/19/2013 [facility] Patient Information 2015 [place], [place]. This information is not intended to replace advice given to you by your health care provider. Make sure you discuss any questions you have with your health care provider.   Colon Polyps Polyps are lumps of extra tissue growing inside the body. Polyps can grow in the large intestine (colon). Most colon polyps are noncancerous (benign). However, some colon polyps can become cancerous over time. Polyps that are larger than a pea may be harmful. To be safe, caregivers remove and test all polyps. CAUSES  Polyps form when mutations in the genes cause your cells to grow and divide even though no more tissue is  needed. RISK FACTORS There are a number of risk factors that can increase your chances of getting colon polyps. They include:  Being older than 50 years.  Family history of colon polyps or colon cancer.  Long-term colon diseases, such as colitis or Crohn disease.  Being overweight.  Smoking.  Being inactive.  Drinking too much alcohol. SYMPTOMS  Most small polyps do not cause symptoms. If symptoms are present, they may include:  Blood in the stool. The stool may look dark red or black.  Constipation or diarrhea that lasts longer than 1 week. DIAGNOSIS People often do not know they have polyps until their caregiver finds them during a regular checkup. Your caregiver can use 4 tests to check for polyps:  Digital rectal exam. The caregiver wears gloves and feels inside the rectum. This test would find polyps only in the rectum.  Barium enema. The caregiver puts a liquid called barium into your rectum before taking X-rays of your colon. Barium makes your colon look white. Polyps are dark, so they are easy to see in the X-ray pictures.  Sigmoidoscopy. A thin, flexible tube (sigmoidoscope) is placed into your rectum. The sigmoidoscope has a light and tiny camera in it. The caregiver uses the sigmoidoscope to look at the last third of your colon.  Colonoscopy. This test is like sigmoidoscopy, but the caregiver looks at the entire colon. This is the most common method for finding and removing polyps. TREATMENT  Any polyps will be removed during a sigmoidoscopy or colonoscopy. The polyps are then tested for cancer. PREVENTION  To help lower your risk of getting more colon polyps:  Eat plenty of fruits and vegetables. Avoid eating fatty foods.  Do not smoke.  Avoid drinking alcohol.  Exercise every day.  Lose weight if recommended by your caregiver.  Eat plenty of calcium and folate. Foods that are rich in calcium include milk, cheese, and broccoli. Foods that are rich in folate  include chickpeas, kidney beans, and spinach. HOME CARE INSTRUCTIONS Keep all follow-up appointments as directed by your caregiver. You may need periodic exams to check for polyps. SEEK MEDICAL CARE IF: You notice bleeding during a bowel movement. Document Released: 07/21/2004 Document Revised: 01/17/2012 Document Reviewed: 01/04/2012 [facility] Patient Information 2015 [place], [place]. This information is not intended to replace advice given to you by your health care provider. Make sure you discuss any questions you have with your health care provider.

## 2015-02-13 ENCOUNTER — Encounter (HOSPITAL_COMMUNITY): Payer: Self-pay | Admitting: Internal Medicine

## 2015-02-15 ENCOUNTER — Encounter: Payer: Self-pay | Admitting: Internal Medicine

## 2015-02-18 ENCOUNTER — Telehealth: Payer: Self-pay | Admitting: Internal Medicine

## 2015-02-18 NOTE — Telephone Encounter (Signed)
I spoke with the pt. Her letter was mailed this morning. Went over the results with her.

## 2015-02-18 NOTE — Telephone Encounter (Signed)
Pt had tcs on 4/6 and was asking how long does it take to get the results back. I told her normally it's 10 business days and once provider has reviewed and signed off that RMR will send a letter explaining what the results showed and what his recommendations are. She agreed and said that she was just anxious to know. I assured her if something was terribly wrong that she would've known something probably the same day, but not to worry and someone would be in touch with her.

## 2015-03-05 ENCOUNTER — Telehealth: Payer: Self-pay

## 2015-03-05 NOTE — Telephone Encounter (Signed)
Pt is having some cramping/pain and nausea with vomiting/diarrhea. Her pain level is a 7 until she vomits and some time she feels like she is going to pass out. This has been going on for awhile. Her call back number is [phone]. Please advise

## 2015-03-06 MED ORDER — ONDANSETRON HCL 4 MG PO TABS: 4.0000 mg | ORAL_TABLET | Freq: Three times a day (TID) | ORAL | Status: DC | PRN

## 2015-03-06 NOTE — Telephone Encounter (Signed)
I am going to send in Zofran for supportive measures, follow liquid diet, and present to the ED if unable to tolerate fluids, has fever, severe abdominal pain.

## 2015-03-06 NOTE — Telephone Encounter (Signed)
LMOM to call back

## 2015-03-06 NOTE — Telephone Encounter (Signed)
Pt called office back and is aware of new RX.

## 2015-04-08 ENCOUNTER — Emergency Department (HOSPITAL_COMMUNITY): Payer: Medicaid Other

## 2015-04-08 ENCOUNTER — Encounter (HOSPITAL_COMMUNITY): Payer: Self-pay | Admitting: Emergency Medicine

## 2015-04-08 ENCOUNTER — Emergency Department (HOSPITAL_COMMUNITY)
Admission: EM | Admit: 2015-04-08 | Discharge: 2015-04-08 | Disposition: A | Discharge status: Home / Self Care | Payer: Medicaid Other | Attending: Emergency Medicine | Admitting: Emergency Medicine

## 2015-04-08 DIAGNOSIS — R059 Cough, unspecified: Secondary | ICD-10-CM

## 2015-04-08 DIAGNOSIS — Z7982 Long term (current) use of aspirin: Secondary | ICD-10-CM | POA: Insufficient documentation

## 2015-04-08 DIAGNOSIS — Z720 Tobacco use: Secondary | ICD-10-CM | POA: Diagnosis not present

## 2015-04-08 DIAGNOSIS — Z8659 Personal history of other mental and behavioral disorders: Secondary | ICD-10-CM | POA: Insufficient documentation

## 2015-04-08 DIAGNOSIS — Z794 Long term (current) use of insulin: Secondary | ICD-10-CM | POA: Diagnosis not present

## 2015-04-08 DIAGNOSIS — E782 Mixed hyperlipidemia: Secondary | ICD-10-CM | POA: Diagnosis not present

## 2015-04-08 DIAGNOSIS — E119 Type 2 diabetes mellitus without complications: Secondary | ICD-10-CM | POA: Insufficient documentation

## 2015-04-08 DIAGNOSIS — E039 Hypothyroidism, unspecified: Secondary | ICD-10-CM | POA: Insufficient documentation

## 2015-04-08 DIAGNOSIS — R05 Cough: Secondary | ICD-10-CM | POA: Diagnosis present

## 2015-04-08 DIAGNOSIS — M1990 Unspecified osteoarthritis, unspecified site: Secondary | ICD-10-CM | POA: Insufficient documentation

## 2015-04-08 DIAGNOSIS — Z87448 Personal history of other diseases of urinary system: Secondary | ICD-10-CM | POA: Diagnosis not present

## 2015-04-08 DIAGNOSIS — Z86010 Personal history of colonic polyps: Secondary | ICD-10-CM | POA: Diagnosis not present

## 2015-04-08 DIAGNOSIS — J209 Acute bronchitis, unspecified: Secondary | ICD-10-CM

## 2015-04-08 DIAGNOSIS — Z79899 Other long term (current) drug therapy: Secondary | ICD-10-CM | POA: Diagnosis not present

## 2015-04-08 DIAGNOSIS — J42 Unspecified chronic bronchitis: Secondary | ICD-10-CM

## 2015-04-08 DIAGNOSIS — Z8719 Personal history of other diseases of the digestive system: Secondary | ICD-10-CM | POA: Insufficient documentation

## 2015-04-08 DIAGNOSIS — I10 Essential (primary) hypertension: Secondary | ICD-10-CM | POA: Insufficient documentation

## 2015-04-08 DIAGNOSIS — J449 Chronic obstructive pulmonary disease, unspecified: Secondary | ICD-10-CM | POA: Diagnosis not present

## 2015-04-08 IMAGING — DX DG CHEST 2V
2 series · 2 of 2 positions shown · non-contrast
Comparison: Radiographs [DATE].

CLINICAL DATA: Nonproductive cough for 2 weeks.

EXAM:
CHEST  2 VIEW

[chest pa]
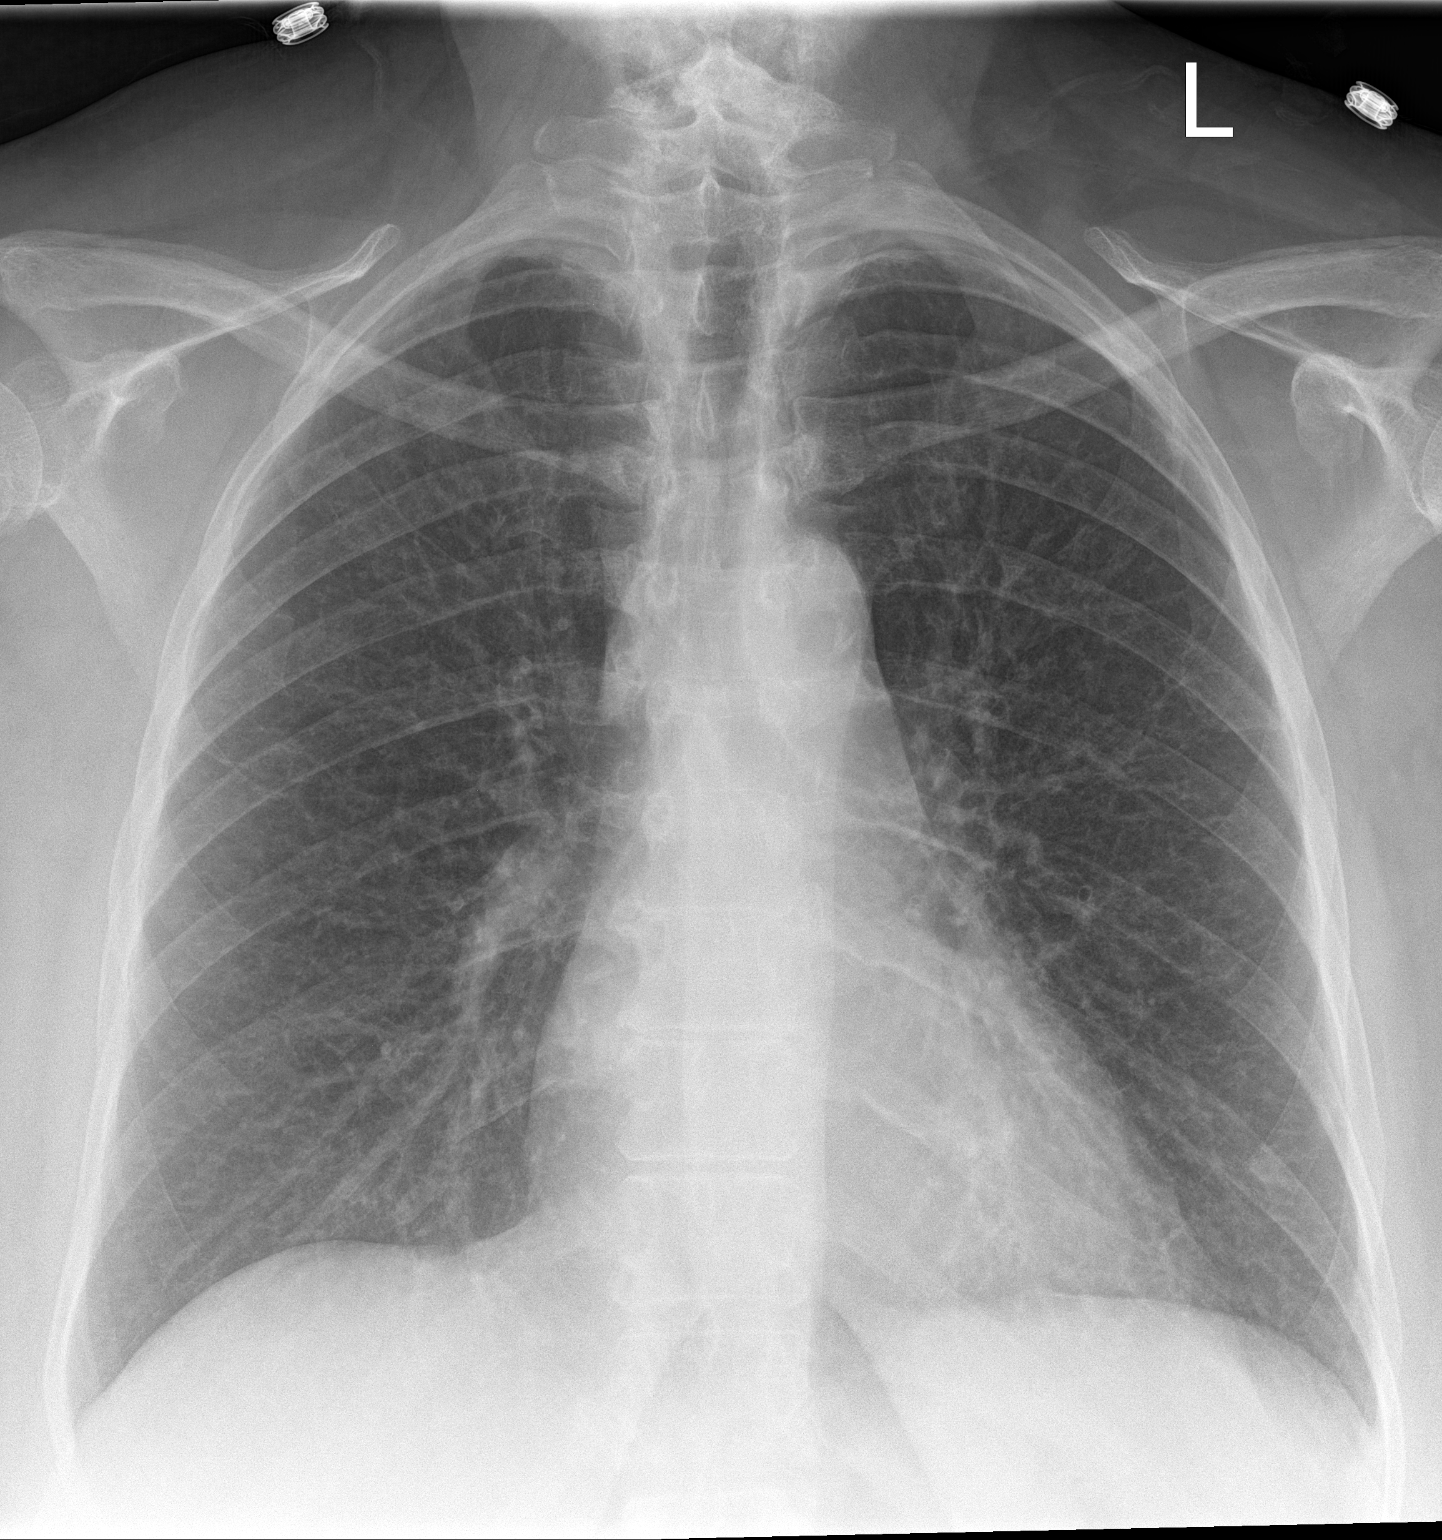

[chest lat]
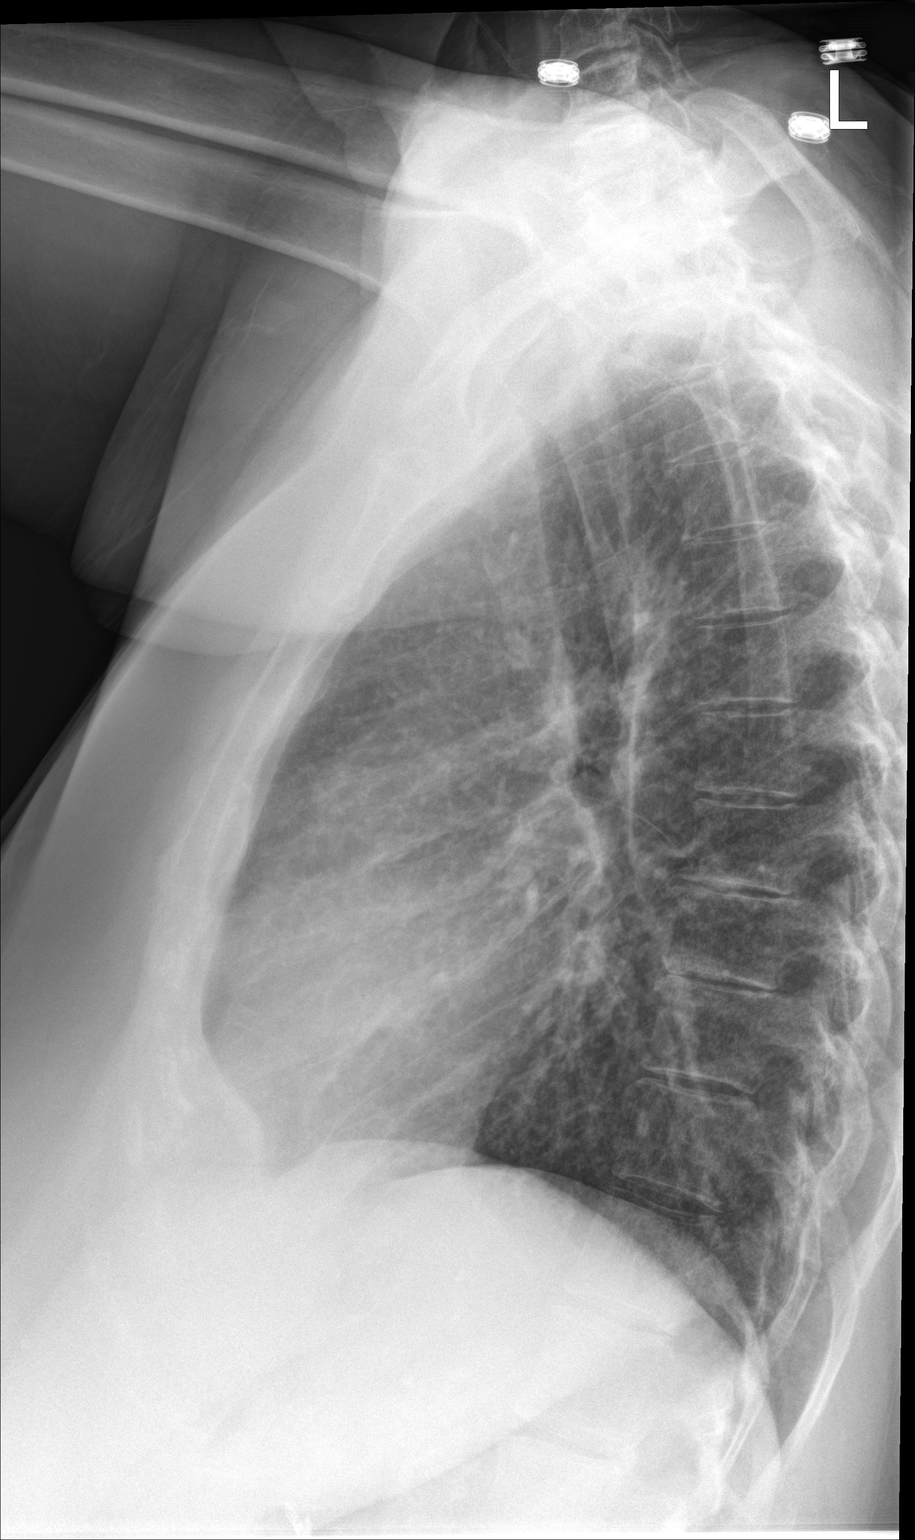

[2 of 2 positions shown; findings below may reference images not displayed]

FINDINGS: The heart size and mediastinal contours are stable. There is
progressive accentuation of the interstitial markings throughout the
lungs. There is no airspace disease, edema or pleural effusion. The
bones appear unremarkable.
IMPRESSION: Mildly accentuated interstitial markings throughout the lungs,
potentially inflammatory. No evidence of pneumonia.

## 2015-04-08 MED ORDER — AZITHROMYCIN 250 MG PO TABS: ORAL_TABLET | ORAL | Status: DC
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
## 2015-04-08 NOTE — ED Provider Notes (Signed)
CSN: [id number]     Arrival date & time 04/08/15  1450 History   First MD Initiated Contact with Patient 04/08/15 1454     Chief Complaint  Patient presents with  . Cough     (Consider location/radiation/quality/duration/timing/severity/associated sxs/prior Treatment) Patient is a 64 y.o. female presenting with cough.  Cough Associated symptoms: sore throat   Associated symptoms: no chest pain, no chills, no fever, no headaches, no rash and no shortness of breath    the patient is a 64 year old female with history of chronic bronchitis who presents with productive cough of yellowish greenish sputum for the past 1-2 weeks. The cough is episodic. There are no specific exacerbating or alleviating factors. There is no associated chest pain or discomfort. The patient is a smoker 2 packs per day, and continues to smoke. The patient denies lower extremity pain or edema. The patient denies associated sore throat, nasal congestion, or other upper respiratory symptoms. No known ill contacts. Patient denies increased shortness of breath.      Past Medical History  Diagnosis Date  . Essential hypertension, benign   . Type 2 diabetes mellitus   . Hypothyroidism   . Mixed hyperlipidemia   . Hx of colonic polyps   . Stress incontinence, female   . S/P colonoscopy Jan 2011    RMR: pancolonic diverticula, polypoid rectal mucosa with  prominent lymphoid aggregates, repeat in Jan 2016   . Irritable bowel syndrome   . PTSD (post-traumatic stress disorder)   . Shortness of breath dyspnea   . COPD (chronic obstructive pulmonary disease)   . Arthritis   . Diverticulosis   . IBS (irritable bowel syndrome)   . Tobacco use    Past Surgical History  Procedure Laterality Date  . Bilateral tubal ligation    . Colonoscopy  11/17/2009    Dr. Gala Romney: normal rectum, pancolonic diverticula, polyps benign, no adenomas.   . Esophagogastroduodenoscopy  11/23/2011    Dr. Gala Romney: Erosive reflux esophagitis; small  hiatal hernia; esophagus dilated empirically  . Maloney dilation  11/23/2011    Procedure: Venia Minks DILATION;  Surgeon: Daneil Dolin, MD;  Location: [facility];  Service: Endoscopy;  Laterality: N/A;  . Cholecystectomy  09/11/2012    Procedure: LAPAROSCOPIC CHOLECYSTECTOMY;  Surgeon: Jamesetta So, MD;  Location: [facility];  Service: General;  Laterality: N/A;  . Tubal ligation    . Breast biopsy Left 10/16/2014    negative  . Skin lesion excision      on buttocks  . Colonoscopy N/A 02/12/2015    Procedure: COLONOSCOPY;  Surgeon: Daneil Dolin, MD;  Location: [facility];  Service: Endoscopy;  Laterality: N/A;  945 - moved to 10:00 - Ginger to notify pt   Family History  Problem Relation Age of Onset  . Coronary artery disease    . Diabetes type II    . Schizophrenia Brother   . Stroke Brother   . Cancer Mother     unsure what type  . Heart disease Father   . Heart attack Father   . Colon cancer Neg Hx    History  Substance Use Topics  . Smoking status: Current Every Day Smoker -- 2.00 packs/day for 31 years    Types: Cigarettes  . Smokeless tobacco: Never Used     Comment: since age 56  . Alcohol Use: No   OB History    No data available     Review of Systems  Constitutional: Negative for fever and  chills.  HENT: Positive for sore throat.   Eyes: Negative for redness.  Respiratory: Positive for cough. Negative for shortness of breath.   Cardiovascular: Negative for chest pain.  Gastrointestinal: Negative for vomiting and abdominal pain.  Endocrine: Negative for polyuria.  Genitourinary: Negative for flank pain.  Musculoskeletal: Negative for back pain and neck pain.  Skin: Negative for rash.  Neurological: Negative for headaches.  Hematological: Does not bruise/bleed easily.  Psychiatric/Behavioral: Negative for confusion.      Allergies  Review of patient's allergies indicates no known allergies.  Home Medications   Prior to Admission medications    Medication Sig Start Date End Date Taking? Authorizing Provider  albuterol (PROVENTIL HFA;VENTOLIN HFA) 108 (90 BASE) MCG/ACT inhaler Inhale 2 puffs into the lungs every 6 (six) hours as needed. For shortness of breath     Historical Provider, MD  amLODipine (NORVASC) 5 MG tablet Take 5 mg by mouth daily.    Historical Provider, MD  aspirin 81 MG EC tablet Take 81 mg by mouth daily.      Historical Provider, MD  cetirizine (ZYRTEC) 10 MG tablet Take 10 mg by mouth daily.     Historical Provider, MD  clonazePAM (KLONOPIN) 1 MG tablet Take 1 mg by mouth at bedtime as needed for anxiety.    Historical Provider, MD  dicyclomine (BENTYL) 20 MG tablet Take 1 tablet (20 mg total) by mouth every 6 (six) hours as needed for spasms (abdominal cramping). 01/10/15   Francine Graven, DO  furosemide (LASIX) 20 MG tablet Take 20 mg by mouth daily.     Historical Provider, MD  gabapentin (NEURONTIN) 100 MG capsule Take 100 mg by mouth 3 (three) times daily.     Historical Provider, MD  HYDROcodone-acetaminophen (NORCO/VICODIN) 5-325 MG per tablet Take 1 tablet by mouth every 4 (four) hours as needed for moderate pain. 10/16/14 10/16/15  Aviva Signs Md, MD  insulin glargine (LANTUS) 100 UNIT/ML injection Inject 80 Units into the skin at bedtime as needed. If needed for blood sugar control.    Historical Provider, MD  insulin lispro (HUMALOG) 100 UNIT/ML injection Inject 10-30 Units into the skin 3 (three) times daily as needed for high blood sugar (Uses according to a sliding scale at home).    Historical Provider, MD  levothyroxine (SYNTHROID, LEVOTHROID) 88 MCG tablet Take 88 mcg by mouth daily before breakfast.    Historical Provider, MD  linagliptin (TRADJENTA) 5 MG TABS tablet Take 5 mg by mouth daily.    Historical Provider, MD  ondansetron (ZOFRAN) 4 MG tablet Take 1 tablet (4 mg total) by mouth every 8 (eight) hours as needed for nausea or vomiting. 01/10/15   Francine Graven, DO  ondansetron (ZOFRAN) 4 MG  tablet Take 1 tablet (4 mg total) by mouth every 8 (eight) hours as needed for nausea or vomiting. 03/06/15   Orvil Feil, NP  PARoxetine (PAXIL) 40 MG tablet Take 40 mg by mouth daily.    Historical Provider, MD  saccharomyces boulardii (FLORASTOR) 250 MG capsule Take 1 capsule (250 mg total) by mouth 2 (two) times daily. 01/14/15   Orvil Feil, NP  simvastatin (ZOCOR) 40 MG tablet Take 40 mg by mouth every evening.    Historical Provider, MD  traZODone (DESYREL) 50 MG tablet Take 50 mg by mouth at bedtime.    Historical Provider, MD   BP 123/56 mmHg  Pulse 87  Temp(Src) 98.1 F (36.7 C) (Oral)  Resp 18  Ht 5\' 5"  (1.651  m)  Wt 220 lb (99.791 kg)  BMI 36.61 kg/m2  SpO2 97% Physical Exam  Constitutional: She appears well-developed and well-nourished. No distress.  HENT:  Mouth/Throat: Oropharynx is clear and moist.  Eyes: Conjunctivae are normal. No scleral icterus.  Neck: Neck supple. No tracheal deviation present.  Cardiovascular: Normal rate, regular rhythm, normal heart sounds and intact distal pulses.  Exam reveals no gallop and no friction rub.   No murmur heard. Pulmonary/Chest: Effort normal and breath sounds normal. No respiratory distress.  Abdominal: Soft. Normal appearance. She exhibits no distension. There is no tenderness.  Musculoskeletal: She exhibits no edema or tenderness.  Neurological: She is alert.  Skin: Skin is warm and dry. No rash noted. She is not diaphoretic.  Psychiatric: She has a normal mood and affect.  Nursing note and vitals reviewed.   ED Course  Procedures (including critical care time)   Dg Chest 2 View  04/08/2015   CLINICAL DATA:  Nonproductive cough for 2 weeks.  EXAM: CHEST  2 VIEW  COMPARISON:  Radiographs 10/11/2014.  FINDINGS: The heart size and mediastinal contours are stable. There is progressive accentuation of the interstitial markings throughout the lungs. There is no airspace disease, edema or pleural effusion. The bones appear  unremarkable.  IMPRESSION: Mildly accentuated interstitial markings throughout the lungs, potentially inflammatory. No evidence of pneumonia.   Electronically Signed   By: Richardean Sale M.D.   On: 04/08/2015 15:35      MDM   Cxr.  Reviewed nursing notes and prior charts for additional history.   Pt notes hx chronic, recurrent bronchitis. Now states subj fever. Prod cough. Will rx abx.   No increased wob or wheezing.   Pt currently appears stable for d/c.      Lajean Saver, MD 04/08/15 [phone]

## 2015-04-08 NOTE — Discharge Instructions (Signed)
It was our pleasure to provide your ER care today - we hope that you feel better.  Take zithromax (antibiotic) as prescribed.  Take robitussin or mucinex as need for cough.  Use your inhalers as need.  Avoid smoking.  Follow up with primary care doctor in 1 week if symptoms fail to improve/resolve.  Return to ER if worse, new symptoms, trouble breathing, high fevers, other concern.    Cough, Adult  A cough is a reflex that helps clear your throat and airways. It can help heal the body or may be a reaction to an irritated airway. A cough may only last 2 or 3 weeks (acute) or may last more than 8 weeks (chronic).  CAUSES Acute cough:  Viral or bacterial infections. Chronic cough:  Infections.  Allergies.  Asthma.  Post-nasal drip.  Smoking.  Heartburn or acid reflux.  Some medicines.  Chronic lung problems (COPD).  Cancer. SYMPTOMS   Cough.  Fever.  Chest pain.  Increased breathing rate.  High-pitched whistling sound when breathing (wheezing).  Colored mucus that you cough up (sputum). TREATMENT   A bacterial cough may be treated with antibiotic medicine.  A viral cough must run its course and will not respond to antibiotics.  Your caregiver may recommend other treatments if you have a chronic cough. HOME CARE INSTRUCTIONS   Only take over-the-counter or prescription medicines for pain, discomfort, or fever as directed by your caregiver. Use cough suppressants only as directed by your caregiver.  Use a cold steam vaporizer or humidifier in your bedroom or home to help loosen secretions.  Sleep in a semi-upright position if your cough is worse at night.  Rest as needed.  Stop smoking if you smoke. SEEK IMMEDIATE MEDICAL CARE IF:   You have pus in your sputum.  Your cough starts to worsen.  You cannot control your cough with suppressants and are losing sleep.  You begin coughing up blood.  You have difficulty breathing.  You develop pain  which is getting worse or is uncontrolled with medicine.  You have a fever. MAKE SURE YOU:   Understand these instructions.  Will watch your condition.  Will get help right away if you are not doing well or get worse. Document Released: 04/23/2011 Document Revised: 01/17/2012 Document Reviewed: 04/23/2011 [facility] Patient Information 2015 [place], [place]. This information is not intended to replace advice given to you by your health care provider. Make sure you discuss any questions you have with your health care provider.    Acute Bronchitis Bronchitis is inflammation of the airways that extend from the windpipe into the lungs (bronchi). The inflammation often causes mucus to develop. This leads to a cough, which is the most common symptom of bronchitis.  In acute bronchitis, the condition usually develops suddenly and goes away over time, usually in a couple weeks. Smoking, allergies, and asthma can make bronchitis worse. Repeated episodes of bronchitis may cause further lung problems.  CAUSES Acute bronchitis is most often caused by the same virus that causes a cold. The virus can spread from person to person (contagious) through coughing, sneezing, and touching contaminated objects. SIGNS AND SYMPTOMS   Cough.   Fever.   Coughing up mucus.   Body aches.   Chest congestion.   Chills.   Shortness of breath.   Sore throat.  DIAGNOSIS  Acute bronchitis is usually diagnosed through a physical exam. Your health care provider will also ask you questions about your medical history. Tests, such as chest X-rays,  are sometimes done to rule out other conditions.  TREATMENT  Acute bronchitis usually goes away in a couple weeks. Oftentimes, no medical treatment is necessary. Medicines are sometimes given for relief of fever or cough. Antibiotic medicines are usually not needed but may be prescribed in certain situations. In some cases, an inhaler may be recommended to help  reduce shortness of breath and control the cough. A cool mist vaporizer may also be used to help thin bronchial secretions and make it easier to clear the chest.  HOME CARE INSTRUCTIONS  Get plenty of rest.   Drink enough fluids to keep your urine clear or pale yellow (unless you have a medical condition that requires fluid restriction). Increasing fluids may help thin your respiratory secretions (sputum) and reduce chest congestion, and it will prevent dehydration.   Take medicines only as directed by your health care provider.  If you were prescribed an antibiotic medicine, finish it all even if you start to feel better.  Avoid smoking and secondhand smoke. Exposure to cigarette smoke or irritating chemicals will make bronchitis worse. If you are a smoker, consider using nicotine gum or skin patches to help control withdrawal symptoms. Quitting smoking will help your lungs heal faster.   Reduce the chances of another bout of acute bronchitis by washing your hands frequently, avoiding people with cold symptoms, and trying not to touch your hands to your mouth, nose, or eyes.   Keep all follow-up visits as directed by your health care provider.  SEEK MEDICAL CARE IF: Your symptoms do not improve after 1 week of treatment.  SEEK IMMEDIATE MEDICAL CARE IF:  You develop an increased fever or chills.   You have chest pain.   You have severe shortness of breath.  You have bloody sputum.   You develop dehydration.  You faint or repeatedly feel like you are going to pass out.  You develop repeated vomiting.  You develop a severe headache. MAKE SURE YOU:   Understand these instructions.  Will watch your condition.  Will get help right away if you are not doing well or get worse. Document Released: 12/02/2004 Document Revised: 03/11/2014 Document Reviewed: 04/17/2013 [facility] Patient Information 2015 [place], [place]. This information is not intended to replace advice given  to you by your health care provider. Make sure you discuss any questions you have with your health care provider.     Smoking Hazards Smoking cigarettes is extremely bad for your health. Tobacco smoke has over 200 known poisons in it. It contains the poisonous gases nitrogen oxide and carbon monoxide. There are over 60 chemicals in tobacco smoke that cause cancer. Some of the chemicals found in cigarette smoke include:   Cyanide.   Benzene.   Formaldehyde.   Methanol (wood alcohol).   Acetylene (fuel used in welding torches).   Ammonia.  Even smoking lightly shortens your life expectancy by several years. You can greatly reduce the risk of medical problems for you and your family by stopping now. Smoking is the most preventable cause of death and disease in our society. Within days of quitting smoking, your circulation improves, you decrease the risk of having a heart attack, and your lung capacity improves. There may be some increased phlegm in the first few days after quitting, and it may take months for your lungs to clear up completely. Quitting for 10 years reduces your risk of developing lung cancer to almost that of a nonsmoker.  WHAT ARE THE RISKS OF SMOKING? Cigarette smokers  have an increased risk of many serious medical problems, including:  Lung cancer.   Lung disease (such as pneumonia, bronchitis, and emphysema).   Heart attack and chest pain due to the heart not getting enough oxygen (angina).   Heart disease and peripheral blood vessel disease.   Hypertension.   Stroke.   Oral cancer (cancer of the lip, mouth, or voice box).   Bladder cancer.   Pancreatic cancer.   Cervical cancer.   Pregnancy complications, including premature birth.   Stillbirths and smaller newborn babies, birth defects, and genetic damage to sperm.   Early menopause.   Lower estrogen level for women.   Infertility.   Facial wrinkles.   Blindness.    Increased risk of broken bones (fractures).   Senile dementia.   Stomach ulcers and internal bleeding.   Delayed wound healing and increased risk of complications during surgery. Because of secondhand smoke exposure, children of smokers have an increased risk of the following:   Sudden infant death syndrome (SIDS).   Respiratory infections.   Lung cancer.   Heart disease.   Ear infections.  WHY IS SMOKING ADDICTIVE? Nicotine is the chemical agent in tobacco that is capable of causing addiction or dependence. When you smoke and inhale, nicotine is absorbed rapidly into the bloodstream through your lungs. Both inhaled and noninhaled nicotine may be addictive.  WHAT ARE THE BENEFITS OF QUITTING?  There are many health benefits to quitting smoking. Some are:   The likelihood of developing cancer and heart disease decreases. Health improvements are seen almost immediately.   Blood pressure, pulse rate, and breathing patterns start returning to normal soon after quitting.   People who quit may see an improvement in their overall quality of life.  HOW DO YOU QUIT SMOKING? Smoking is an addiction with both physical and psychological effects, and longtime habits can be hard to change. Your health care provider can recommend:  Programs and community resources, which may include group support, education, or therapy.  Replacement products, such as patches, gum, and nasal sprays. Use these products only as directed. Do not replace cigarette smoking with electronic cigarettes (commonly called e-cigarettes). The safety of e-cigarettes is unknown, and some may contain harmful chemicals. FOR MORE INFORMATION  American Lung Association: www.lung.org  American Cancer Society: www.cancer.org Document Released: 12/02/2004 Document Revised: 08/15/2013 Document Reviewed: 04/16/2013 [facility] Patient Information 2015 [place], [place]. This information is not intended to replace advice  given to you by your health care provider. Make sure you discuss any questions you have with your health care provider.

## 2015-04-08 NOTE — ED Notes (Signed)
Pt states that she has been coughing for 2 weeks now, nonproductive in nature.  Pt is not experiencing cough at this time.  Pt states that she got mad at her home health aide for not taking her to the doctor so she decided to come here.  Pt states that nothing is bothering her right now but she needed to be seen.

## 2015-07-23 ENCOUNTER — Ambulatory Visit: Payer: Medicaid Other | Admitting: Gastroenterology

## 2015-08-08 LAB — HEMOGLOBIN A1C: Hgb A1c MFr Bld: 10.3 % — AB (ref 4.0–6.0)

## 2015-08-13 ENCOUNTER — Encounter: Payer: Self-pay | Admitting: Gastroenterology

## 2015-08-13 ENCOUNTER — Ambulatory Visit (INDEPENDENT_AMBULATORY_CARE_PROVIDER_SITE_OTHER): Payer: Medicaid Other | Admitting: Gastroenterology

## 2015-08-13 VITALS — BP 134/86 | HR 108 | Temp 98.3°F | Ht 64.0 in | Wt 208.8 lb

## 2015-08-13 DIAGNOSIS — K580 Irritable bowel syndrome with diarrhea: Secondary | ICD-10-CM | POA: Diagnosis not present

## 2015-08-13 DIAGNOSIS — Z86010 Personal history of colonic polyps: Secondary | ICD-10-CM | POA: Diagnosis not present

## 2015-08-13 MED ORDER — ELUXADOLINE 75 MG PO TABS: 1.0000 | ORAL_TABLET | Freq: Two times a day (BID) | ORAL | Status: DC

## 2015-08-13 NOTE — Progress Notes (Signed)
Referring Provider: Rosita Fire, MD Primary Care Physician:  Rosita Fire, MD Primary GI: Dr. Gala Romney   Chief Complaint  Patient presents with  . Follow-up    HPI:   Claudia Keller is a 64 y.o. female presenting today with a history of adenomas, intermittent explosive diarrhea, associated abdominal cramping. Probiotics helps tremendously. Recent colonoscopy completed due to history of adenomas; needs surveillance in 2021. Negative segmental biopsies.   Taking probiotic and imodium every other day. Diarrhea majority of the time. Notes abdominal discomfort, griping. Bentyl occasionally. Afraid to go to church for fear of accidents. Bristol scale #7.   Past Medical History  Diagnosis Date  . Essential hypertension, benign   . Type 2 diabetes mellitus ([place])   . Hypothyroidism   . Mixed hyperlipidemia   . Hx of colonic polyps   . Stress incontinence, female   . S/P colonoscopy Jan 2011    RMR: pancolonic diverticula, polypoid rectal mucosa with  prominent lymphoid aggregates, repeat in Jan 2016   . Irritable bowel syndrome   . PTSD (post-traumatic stress disorder)   . Shortness of breath dyspnea   . COPD (chronic obstructive pulmonary disease) ([place])   . Arthritis   . Diverticulosis   . IBS (irritable bowel syndrome)   . Tobacco use     Past Surgical History  Procedure Laterality Date  . Bilateral tubal ligation    . Colonoscopy  11/17/2009    Dr. Gala Romney: normal rectum, pancolonic diverticula, polyps benign, no adenomas.   . Esophagogastroduodenoscopy  11/23/2011    Dr. Gala Romney: Erosive reflux esophagitis; small hiatal hernia; esophagus dilated empirically  . Maloney dilation  11/23/2011    Procedure: Venia Minks DILATION;  Surgeon: Daneil Dolin, MD;  [facility];  Service: Endoscopy;  Laterality: N/A;  . Cholecystectomy  09/11/2012    Procedure: LAPAROSCOPIC CHOLECYSTECTOMY;  Surgeon: Jamesetta So, MD;  Location: [facility];  Service: General;  Laterality: N/A;  .  Tubal ligation    . Breast biopsy Left 10/16/2014    negative  . Skin lesion excision      on buttocks  . Colonoscopy N/A 02/12/2015    Dr. Gala Romney: colonic diverticulosis, multiple colonic polyps (tubular adenomas), negative segmental biopsies. Surveillance in 2021    Current Outpatient Prescriptions  Medication Sig Dispense Refill  . acetaminophen (TYLENOL) 500 MG tablet Take 1,000 mg by mouth every 6 (six) hours as needed.    Marland Kitchen albuterol (PROVENTIL HFA;VENTOLIN HFA) 108 (90 BASE) MCG/ACT inhaler Inhale 2 puffs into the lungs every 6 (six) hours as needed. For shortness of breath     . amLODipine (NORVASC) 5 MG tablet Take 5 mg by mouth daily.    Marland Kitchen aspirin 81 MG EC tablet Take 81 mg by mouth daily.      Marland Kitchen buPROPion (WELLBUTRIN SR) 200 MG 12 hr tablet Take 200 mg by mouth 2 (two) times daily.    . cetirizine (ZYRTEC) 10 MG tablet Take 10 mg by mouth daily.     . clonazePAM (KLONOPIN) 1 MG tablet Take 1 mg by mouth at bedtime as needed for anxiety.    . furosemide (LASIX) 20 MG tablet Take 20 mg by mouth daily.     Marland Kitchen gabapentin (NEURONTIN) 100 MG capsule Take 100 mg by mouth 3 (three) times daily.     . insulin glargine (LANTUS) 100 UNIT/ML injection Inject 80 Units into the skin at bedtime as needed. If needed for blood sugar control.    . insulin  lispro (HUMALOG) 100 UNIT/ML injection Inject 10-30 Units into the skin 3 (three) times daily as needed for high blood sugar (Uses according to a sliding scale at home).    Marland Kitchen levothyroxine (SYNTHROID, LEVOTHROID) 88 MCG tablet Take 88 mcg by mouth daily before breakfast.    . linagliptin (TRADJENTA) 5 MG TABS tablet Take 5 mg by mouth daily.    . ondansetron (ZOFRAN) 4 MG tablet Take 1 tablet (4 mg total) by mouth every 8 (eight) hours as needed for nausea or vomiting. 30 tablet 1  . PARoxetine (PAXIL) 40 MG tablet Take 40 mg by mouth daily.    Marland Kitchen saccharomyces boulardii (FLORASTOR) 250 MG capsule Take 1 capsule (250 mg total) by mouth 2 (two) times  daily. 60 capsule 5  . simvastatin (ZOCOR) 40 MG tablet Take 40 mg by mouth every evening.    . traZODone (DESYREL) 50 MG tablet Take 50 mg by mouth at bedtime.     No current facility-administered medications for this visit.    Allergies as of 08/13/2015  . (No Known Allergies)    Family History  Problem Relation Age of Onset  . Coronary artery disease    . Diabetes type II    . Schizophrenia Brother   . Stroke Brother   . Cancer Mother     unsure what type  . Heart disease Father   . Heart attack Father   . Colon cancer Neg Hx     Social History   Social History  . Marital Status: Legally Separated    Spouse Name: N/A  . Number of Children: N/A  . Years of Education: N/A   Social History Main Topics  . Smoking status: Current Every Day Smoker -- 2.00 packs/day for 31 years    Types: Cigarettes  . Smokeless tobacco: Never Used     Comment: since age 40  . Alcohol Use: No  . Drug Use: No  . Sexual Activity: Yes    Birth Control/ Protection: Surgical   Other Topics Concern  . None   Social History Narrative    Review of Systems: Negative unless mentioned in HPI   Physical Exam: BP 134/86 mmHg  Pulse 108  Temp(Src) 98.3 F (36.8 C) (Oral)  Ht 5\' 4"  (1.626 m)  Wt 208 lb 12.8 oz (94.711 kg)  BMI 35.82 kg/m2 General:   Alert and oriented. No distress noted. Pleasant and cooperative.  Head:  Normocephalic and atraumatic. Eyes:  Conjuctiva clear without scleral icterus. Mouth:  Oral mucosa pink and moist. Good dentition. No lesions. Abdomen:  +BS, soft, non-tender and non-distended. No rebound or guarding. No HSM or masses noted. Msk:  Symmetrical without gross deformities. Normal posture. Extremities:  Without edema. Neurologic:  Alert and  oriented x4;  grossly normal neurologically. Psych:  Alert and cooperative. Normal mood and affect.

## 2015-08-13 NOTE — Assessment & Plan Note (Signed)
Surveillance 2021

## 2015-08-13 NOTE — Patient Instructions (Signed)
Stop taking Bentyl and Imodium.   Start taking Viberzi 1 tablet WITH Food twice a day. Monitor for severe abdominal pain, sudden absence of bowel movements, nausea and vomiting. If so, call us immediately. If it feels too strong, you may decrease to ONCE a day.   We will see you in 6 weeks.

## 2015-08-13 NOTE — Assessment & Plan Note (Addendum)
64 year old with recent colonoscopy, multiple adenomas and negative segmental biopsies. Seems to be consistent with IBS-D, compounded by stress. Will trial Viberzi 75 mg po BID, samples and prescription provided. Discussed signs/symptoms to report. STOP IMODIUM AND BENTYL. Return in 6 weeks.

## 2015-08-14 NOTE — Progress Notes (Signed)
cc'ed to pcp °

## 2015-08-21 ENCOUNTER — Ambulatory Visit: Payer: Self-pay | Admitting: "Endocrinology

## 2015-09-08 ENCOUNTER — Ambulatory Visit (INDEPENDENT_AMBULATORY_CARE_PROVIDER_SITE_OTHER): Payer: Medicaid Other | Admitting: "Endocrinology

## 2015-09-08 ENCOUNTER — Encounter: Payer: Self-pay | Admitting: "Endocrinology

## 2015-09-08 VITALS — BP 145/79 | HR 79 | Ht 65.0 in | Wt 207.0 lb

## 2015-09-08 DIAGNOSIS — E118 Type 2 diabetes mellitus with unspecified complications: Secondary | ICD-10-CM

## 2015-09-08 DIAGNOSIS — IMO0002 Reserved for concepts with insufficient information to code with codable children: Secondary | ICD-10-CM | POA: Insufficient documentation

## 2015-09-08 DIAGNOSIS — I129 Hypertensive chronic kidney disease with stage 1 through stage 4 chronic kidney disease, or unspecified chronic kidney disease: Secondary | ICD-10-CM | POA: Insufficient documentation

## 2015-09-08 DIAGNOSIS — E785 Hyperlipidemia, unspecified: Secondary | ICD-10-CM | POA: Diagnosis not present

## 2015-09-08 DIAGNOSIS — E119 Type 2 diabetes mellitus without complications: Secondary | ICD-10-CM | POA: Insufficient documentation

## 2015-09-08 DIAGNOSIS — E039 Hypothyroidism, unspecified: Secondary | ICD-10-CM | POA: Diagnosis not present

## 2015-09-08 DIAGNOSIS — Z794 Long term (current) use of insulin: Secondary | ICD-10-CM

## 2015-09-08 DIAGNOSIS — E1122 Type 2 diabetes mellitus with diabetic chronic kidney disease: Secondary | ICD-10-CM | POA: Insufficient documentation

## 2015-09-08 DIAGNOSIS — I10 Essential (primary) hypertension: Secondary | ICD-10-CM | POA: Diagnosis not present

## 2015-09-08 DIAGNOSIS — E1165 Type 2 diabetes mellitus with hyperglycemia: Secondary | ICD-10-CM | POA: Diagnosis not present

## 2015-09-08 DIAGNOSIS — N182 Chronic kidney disease, stage 2 (mild): Secondary | ICD-10-CM

## 2015-09-08 NOTE — Progress Notes (Signed)
Subjective:    Patient ID: Claudia Keller, female    DOB: Mar 25, 1951,    Past Medical History  Diagnosis Date  . Essential hypertension, benign   . Type 2 diabetes mellitus ([place])   . Hypothyroidism   . Mixed hyperlipidemia   . Hx of colonic polyps   . Stress incontinence, female   . S/P colonoscopy Jan 2011    RMR: pancolonic diverticula, polypoid rectal mucosa with  prominent lymphoid aggregates, repeat in Jan 2016   . Irritable bowel syndrome   . PTSD (post-traumatic stress disorder)   . Shortness of breath dyspnea   . COPD (chronic obstructive pulmonary disease) ([place])   . Arthritis   . Diverticulosis   . IBS (irritable bowel syndrome)   . Tobacco use   . Thyroid disease    Past Surgical History  Procedure Laterality Date  . Bilateral tubal ligation    . Colonoscopy  11/17/2009    Dr. Gala Romney: normal rectum, pancolonic diverticula, polyps benign, no adenomas.   . Esophagogastroduodenoscopy  11/23/2011    Dr. Gala Romney: Erosive reflux esophagitis; small hiatal hernia; esophagus dilated empirically  . Maloney dilation  11/23/2011    Procedure: Venia Minks DILATION;  Surgeon: Daneil Dolin, MD;  Location: [facility];  Service: Endoscopy;  Laterality: N/A;  . Cholecystectomy  09/11/2012    Procedure: LAPAROSCOPIC CHOLECYSTECTOMY;  Surgeon: Jamesetta So, MD;  Location: [facility];  Service: General;  Laterality: N/A;  . Tubal ligation    . Breast biopsy Left 10/16/2014    negative  . Skin lesion excision      on buttocks  . Colonoscopy N/A 02/12/2015    Dr. Gala Romney: colonic diverticulosis, multiple colonic polyps (tubular adenomas), negative segmental biopsies. Surveillance in 2021   Social History   Social History  . Marital Status: Legally Separated    Spouse Name: N/A  . Number of Children: N/A  . Years of Education: N/A   Social History Main Topics  . Smoking status: Current Every Day Smoker -- 2.00 packs/day for 31 years    Types: Cigarettes  . Smokeless tobacco: Never  Used     Comment: since age 74  . Alcohol Use: No  . Drug Use: No  . Sexual Activity: Yes    Birth Control/ Protection: Surgical   Other Topics Concern  . None   Social History Narrative   Outpatient Encounter Prescriptions as of 09/08/2015  Medication Sig  . acetaminophen (TYLENOL) 500 MG tablet Take 1,000 mg by mouth every 6 (six) hours as needed.  Marland Kitchen albuterol (PROVENTIL HFA;VENTOLIN HFA) 108 (90 BASE) MCG/ACT inhaler Inhale 2 puffs into the lungs every 6 (six) hours as needed. For shortness of breath   . amLODipine (NORVASC) 5 MG tablet Take 5 mg by mouth daily.  Marland Kitchen aspirin 81 MG EC tablet Take 81 mg by mouth daily.    Marland Kitchen buPROPion (WELLBUTRIN SR) 200 MG 12 hr tablet Take 200 mg by mouth 2 (two) times daily.  . cetirizine (ZYRTEC) 10 MG tablet Take 10 mg by mouth daily.   . clonazePAM (KLONOPIN) 1 MG tablet Take 0.5 mg by mouth at bedtime as needed for anxiety.   . Eluxadoline (VIBERZI) 75 MG TABS Take 1 tablet by mouth 2 (two) times daily with a meal.  . furosemide (LASIX) 20 MG tablet Take 20 mg by mouth daily.   Marland Kitchen gabapentin (NEURONTIN) 100 MG capsule Take 100 mg by mouth 3 (three) times daily.   . insulin glargine (  LANTUS) 100 UNIT/ML injection Inject 60 Units into the skin at bedtime as needed.  . insulin lispro (HUMALOG) 100 UNIT/ML injection Inject 10-16 Units into the skin 3 (three) times daily as needed.  Marland Kitchen levothyroxine (SYNTHROID, LEVOTHROID) 88 MCG tablet Take 88 mcg by mouth daily before breakfast.  . linagliptin (TRADJENTA) 5 MG TABS tablet Take 5 mg by mouth daily.  Marland Kitchen PARoxetine (PAXIL) 40 MG tablet Take 40 mg by mouth daily.  Marland Kitchen saccharomyces boulardii (FLORASTOR) 250 MG capsule Take 1 capsule (250 mg total) by mouth 2 (two) times daily.  . simvastatin (ZOCOR) 40 MG tablet Take 40 mg by mouth every evening.  . traZODone (DESYREL) 50 MG tablet Take 50 mg by mouth at bedtime.  . ondansetron (ZOFRAN) 4 MG tablet Take 1 tablet (4 mg total) by mouth every 8 (eight) hours as  needed for nausea or vomiting. (Patient not taking: Reported on 09/08/2015)   No facility-administered encounter medications on file as of 09/08/2015.   ALLERGIES: No Known Allergies VACCINATION STATUS:  There is no immunization history on file for this patient.  Diabetes She presents for her initial diabetic visit. She has type 2 diabetes mellitus. Onset time: She was diagnosed at approximate age of 16 years. Her disease course has been worsening. There are no hypoglycemic associated symptoms. Pertinent negatives for hypoglycemia include no confusion, headaches, pallor or seizures. Associated symptoms include polydipsia and polyuria. Pertinent negatives for diabetes include no chest pain, no fatigue and no polyphagia. There are no hypoglycemic complications. Symptoms are worsening. Diabetic complications include peripheral neuropathy. Risk factors for coronary artery disease include post-menopausal, dyslipidemia and diabetes mellitus. Current diabetic treatment includes insulin injections. Her weight is increasing steadily. She has not had a previous visit with a dietitian. She rarely participates in exercise. Home blood sugar record trend: She did not bring the meter nor blood glucose logs.  Hyperlipidemia Pertinent negatives include no chest pain, myalgias or shortness of breath.  Hypertension Pertinent negatives include no chest pain, headaches, palpitations or shortness of breath. Hypertensive end-organ damage includes a thyroid problem.  Thyroid Problem Patient reports no cold intolerance, diarrhea, fatigue, heat intolerance or palpitations. Her past medical history is significant for hyperlipidemia.     Review of Systems  Constitutional: Negative for fatigue and unexpected weight change.  HENT: Negative for trouble swallowing and voice change.   Eyes: Negative for visual disturbance.  Respiratory: Negative for cough, shortness of breath and wheezing.   Cardiovascular: Negative for  chest pain, palpitations and leg swelling.  Gastrointestinal: Negative for nausea, vomiting and diarrhea.  Endocrine: Positive for polydipsia and polyuria. Negative for cold intolerance, heat intolerance and polyphagia.  Musculoskeletal: Negative for myalgias and arthralgias.  Skin: Negative for color change, pallor, rash and wound.  Neurological: Negative for seizures and headaches.  Psychiatric/Behavioral: Negative for suicidal ideas and confusion.    Objective:    BP 145/79 mmHg  Pulse 79  Ht 5\' 5"  (1.651 m)  Wt 207 lb (93.895 kg)  BMI 34.45 kg/m2  SpO2 91%  Wt Readings from Last 3 Encounters:  09/08/15 207 lb (93.895 kg)  08/13/15 208 lb 12.8 oz (94.711 kg)  04/08/15 220 lb (99.791 kg)    Physical Exam  Constitutional: She is oriented to person, place, and time. She appears well-developed.  HENT:  Head: Normocephalic and atraumatic.  Eyes: EOM are normal.  Neck: Normal range of motion. Neck supple. No tracheal deviation present. No thyromegaly present.  Cardiovascular: Normal rate and regular rhythm.   Pulmonary/Chest:  Effort normal and breath sounds normal.  Abdominal: Soft. Bowel sounds are normal. There is no tenderness. There is no guarding.  Musculoskeletal: Normal range of motion. She exhibits no edema.  Neurological: She is alert and oriented to person, place, and time. She has normal reflexes. No cranial nerve deficit. Coordination normal.  Skin: Skin is warm and dry. No rash noted. No erythema. No pallor.  Psychiatric: She has a normal mood and affect. Judgment normal.    Results for orders placed or performed during the hospital encounter of 02/12/15  Glucose, capillary  Result Value Ref Range   Glucose-Capillary 153 (H) 70 - 99 mg/dL   Complete Blood Count (Most recent): Lab Results  Component Value Date   WBC 10.6* 01/10/2015   HGB 15.8* 01/10/2015   HCT 45.6 01/10/2015   MCV 89.8 01/10/2015   PLT 172 01/10/2015   Chemistry (most recent): Lab  Results  Component Value Date   NA 138 01/10/2015   K 3.4* 01/10/2015   CL 102 01/10/2015   CO2 28 01/10/2015   BUN 17 01/10/2015   CREATININE 1.09 01/10/2015      Assessment & Plan:   1. Uncontrolled type 2 diabetes mellitus with complication, with long-term current use of insulin ([place])    - Patient has currently uncontrolled symptomatic type 2 DM since 64  years of age,  with most recent A1c of 10.3 %. Recent labs reviewed.   -her diabetes is complicated by neuropathy and patient remains at a high risk for more acute and chronic complications of diabetes which include CAD, CVA, CKD, retinopathy, and neuropathy. These are all discussed in detail with the patient.  - I have counseled the patient on diet management and weight loss, by adopting a carbohydrate restricted/protein rich diet.  - Suggestion is made for patient to avoid simple carbohydrates   from their diet including Cakes , Desserts, Ice Cream,  Soda (  diet and regular) , Sweet Tea , Candies,  Chips, Cookies, Artificial Sweeteners,   and "Sugar-free" Products . This will help patient to have stable blood glucose profile and potentially avoid unintended weight gain.  - I encouraged the patient to switch to  unprocessed or minimally processed complex starch and increased protein intake (animal or plant source), fruits, and vegetables.  - Patient is advised to stick to a routine mealtimes to eat 3 meals  a day and avoid unnecessary snacks ( to snack only to correct hypoglycemia).  - The patient will be scheduled with Jearld Fenton, RDN, CDE for individualized DM education.  - I have approached patient with the following individualized plan to manage diabetes and patient agrees:   - Will continue Lantus at 50 units qhs, will continue Humalog 15 units TIDAC plus sliding scale. - Continue Metformin 1000mg  po BID. Marland Kitchen  - Patient will be considered for incretin therapy as appropriate next visit. - Patient specific target  A1c;   LDL, HDL, Triglycerides, and  Waist Circumference were discussed in detail.  2) BP/HTN: Controlled. Continue current medications. 3) Lipids/HPL:   continue statins. 4)  Weight/Diet: CDE Consult will be initiated , exercise, and detailed carbohydrates information provided. 5) Hypothyroidism:   6) Chronic Care/Health Maintenance:  -Patient  Is on  Statin medications and encouraged to continue to follow up with Ophthalmology, Podiatrist at least yearly or according to recommendations, and advised to  stay away from smoking. I have recommended yearly flu vaccine and pneumonia vaccination at least every 5 years; moderate intensity exercise for up  to 150 minutes weekly; and  sleep for at least 7 hours a day.   Patient to bring meter and  blood glucose logs during their next visit.   I advised patient to maintain close follow up with their PCP for primary care needs. Follow up plan: Return in about 1 week (around 09/15/2015) for diabetes, high blood pressure, high cholesterol, underactive thyroid.  Glade Lloyd, MD Phone: [phone]  Fax: [phone]   09/08/2015, 6:12 PM

## 2015-09-08 NOTE — Patient Instructions (Signed)

## 2015-09-22 ENCOUNTER — Encounter: Payer: Self-pay | Admitting: "Endocrinology

## 2015-09-22 ENCOUNTER — Ambulatory Visit (INDEPENDENT_AMBULATORY_CARE_PROVIDER_SITE_OTHER): Payer: Medicaid Other | Admitting: "Endocrinology

## 2015-09-22 VITALS — BP 124/82 | HR 98 | Wt 206.0 lb

## 2015-09-22 DIAGNOSIS — I10 Essential (primary) hypertension: Secondary | ICD-10-CM | POA: Diagnosis not present

## 2015-09-22 DIAGNOSIS — E1165 Type 2 diabetes mellitus with hyperglycemia: Secondary | ICD-10-CM

## 2015-09-22 DIAGNOSIS — E039 Hypothyroidism, unspecified: Secondary | ICD-10-CM | POA: Diagnosis not present

## 2015-09-22 DIAGNOSIS — E118 Type 2 diabetes mellitus with unspecified complications: Secondary | ICD-10-CM | POA: Diagnosis not present

## 2015-09-22 DIAGNOSIS — Z794 Long term (current) use of insulin: Secondary | ICD-10-CM | POA: Diagnosis not present

## 2015-09-22 DIAGNOSIS — E785 Hyperlipidemia, unspecified: Secondary | ICD-10-CM | POA: Diagnosis not present

## 2015-09-22 DIAGNOSIS — IMO0002 Reserved for concepts with insufficient information to code with codable children: Secondary | ICD-10-CM

## 2015-09-22 NOTE — Progress Notes (Signed)
Subjective:    Patient ID: Claudia Keller, female    DOB: [birth year],    Past Medical History  Diagnosis Date  . Essential hypertension, benign   . Type 2 diabetes mellitus ([place])   . Hypothyroidism   . Mixed hyperlipidemia   . Hx of colonic polyps   . Stress incontinence, female   . S/P colonoscopy Jan 2011    RMR: pancolonic diverticula, polypoid rectal mucosa with  prominent lymphoid aggregates, repeat in Jan 2016   . Irritable bowel syndrome   . PTSD (post-traumatic stress disorder)   . Shortness of breath dyspnea   . COPD (chronic obstructive pulmonary disease) ([place])   . Arthritis   . Diverticulosis   . IBS (irritable bowel syndrome)   . Tobacco use   . Thyroid disease    Past Surgical History  Procedure Laterality Date  . Bilateral tubal ligation    . Colonoscopy  11/17/2009    Dr. Gala Romney: normal rectum, pancolonic diverticula, polyps benign, no adenomas.   . Esophagogastroduodenoscopy  11/23/2011    Dr. Gala Romney: Erosive reflux esophagitis; small hiatal hernia; esophagus dilated empirically  . Maloney dilation  11/23/2011    Procedure: Venia Minks DILATION;  Surgeon: Daneil Dolin, MD;  [facility];  Service: Endoscopy;  Laterality: N/A;  . Cholecystectomy  09/11/2012    Procedure: LAPAROSCOPIC CHOLECYSTECTOMY;  Surgeon: Jamesetta So, MD;  Location: [facility];  Service: General;  Laterality: N/A;  . Tubal ligation    . Breast biopsy Left 10/16/2014    negative  . Skin lesion excision      on buttocks  . Colonoscopy N/A 02/12/2015    Dr. Gala Romney: colonic diverticulosis, multiple colonic polyps (tubular adenomas), negative segmental biopsies. Surveillance in 2021   Social History   Social History  . Marital Status: Legally Separated    Spouse Name: N/A  . Number of Children: N/A  . Years of Education: N/A   Social History Main Topics  . Smoking status: Current Every Day Smoker -- 2.00 packs/day for 31 years    Types: Cigarettes  . Smokeless tobacco: Never  Used     Comment: since age 19  . Alcohol Use: No  . Drug Use: No  . Sexual Activity: Yes    Birth Control/ Protection: Surgical   Other Topics Concern  . None   Social History Narrative   Outpatient Encounter Prescriptions as of 09/22/2015  Medication Sig  . acetaminophen (TYLENOL) 500 MG tablet Take 1,000 mg by mouth every 6 (six) hours as needed.  Marland Kitchen albuterol (PROVENTIL HFA;VENTOLIN HFA) 108 (90 BASE) MCG/ACT inhaler Inhale 2 puffs into the lungs every 6 (six) hours as needed. For shortness of breath   . amLODipine (NORVASC) 5 MG tablet Take 5 mg by mouth daily.  Marland Kitchen aspirin 81 MG EC tablet Take 81 mg by mouth daily.    Marland Kitchen buPROPion (WELLBUTRIN SR) 200 MG 12 hr tablet Take 200 mg by mouth 2 (two) times daily.  . cetirizine (ZYRTEC) 10 MG tablet Take 10 mg by mouth daily.   . clonazePAM (KLONOPIN) 1 MG tablet Take 0.5 mg by mouth at bedtime as needed for anxiety.   . Eluxadoline (VIBERZI) 75 MG TABS Take 1 tablet by mouth 2 (two) times daily with a meal.  . furosemide (LASIX) 20 MG tablet Take 20 mg by mouth daily.   Marland Kitchen gabapentin (NEURONTIN) 100 MG capsule Take 100 mg by mouth 3 (three) times daily.   . insulin glargine (  LANTUS) 100 UNIT/ML injection Inject 60 Units into the skin at bedtime.  . insulin lispro (HUMALOG) 100 UNIT/ML injection Inject 18-24 Units into the skin 3 (three) times daily as needed.  Marland Kitchen levothyroxine (SYNTHROID, LEVOTHROID) 88 MCG tablet Take 88 mcg by mouth daily before breakfast.  . PARoxetine (PAXIL) 40 MG tablet Take 40 mg by mouth daily.  Marland Kitchen saccharomyces boulardii (FLORASTOR) 250 MG capsule Take 1 capsule (250 mg total) by mouth 2 (two) times daily.  . simvastatin (ZOCOR) 40 MG tablet Take 40 mg by mouth every evening.  . traZODone (DESYREL) 50 MG tablet Take 50 mg by mouth at bedtime.  . [DISCONTINUED] linagliptin (TRADJENTA) 5 MG TABS tablet Take 5 mg by mouth daily.  . ondansetron (ZOFRAN) 4 MG tablet Take 1 tablet (4 mg total) by mouth every 8 (eight)  hours as needed for nausea or vomiting. (Patient not taking: Reported on 09/08/2015)   No facility-administered encounter medications on file as of 09/22/2015.   ALLERGIES: No Known Allergies VACCINATION STATUS:  There is no immunization history on file for this patient.  Diabetes She presents for her follow-up diabetic visit. She has type 2 diabetes mellitus. Onset time: She was diagnosed at approximate age of 34 years. Her disease course has been improving. There are no hypoglycemic associated symptoms. Pertinent negatives for hypoglycemia include no confusion, headaches, pallor or seizures. Associated symptoms include polydipsia and polyuria. Pertinent negatives for diabetes include no chest pain, no fatigue and no polyphagia. There are no hypoglycemic complications. Symptoms are improving. Diabetic complications include peripheral neuropathy. Risk factors for coronary artery disease include post-menopausal, dyslipidemia and diabetes mellitus. Current diabetic treatment includes insulin injections. Her weight is stable. She is following a generally unhealthy diet. She has not had a previous visit with a dietitian. She rarely participates in exercise. Her overall blood glucose range is 180-200 mg/dl.  Hyperlipidemia Pertinent negatives include no chest pain, myalgias or shortness of breath.  Hypertension Pertinent negatives include no chest pain, headaches, palpitations or shortness of breath. Hypertensive end-organ damage includes a thyroid problem.  Thyroid Problem Patient reports no cold intolerance, diarrhea, fatigue, heat intolerance or palpitations. Her past medical history is significant for hyperlipidemia.     Review of Systems  Constitutional: Negative for fatigue and unexpected weight change.  HENT: Negative for trouble swallowing and voice change.   Eyes: Negative for visual disturbance.  Respiratory: Negative for cough, shortness of breath and wheezing.   Cardiovascular:  Negative for chest pain, palpitations and leg swelling.  Gastrointestinal: Negative for nausea, vomiting and diarrhea.  Endocrine: Positive for polydipsia and polyuria. Negative for cold intolerance, heat intolerance and polyphagia.  Musculoskeletal: Negative for myalgias and arthralgias.  Skin: Negative for color change, pallor, rash and wound.  Neurological: Negative for seizures and headaches.  Psychiatric/Behavioral: Negative for suicidal ideas and confusion.    Objective:    BP 124/82 mmHg  Pulse 98  Wt 206 lb (93.441 kg)  SpO2 92%  Wt Readings from Last 3 Encounters:  09/22/15 206 lb (93.441 kg)  09/08/15 207 lb (93.895 kg)  08/13/15 208 lb 12.8 oz (94.711 kg)    Physical Exam  Constitutional: She is oriented to person, place, and time. She appears well-developed.  HENT:  Head: Normocephalic and atraumatic.  Eyes: EOM are normal.  Neck: Normal range of motion. Neck supple. No tracheal deviation present. No thyromegaly present.  Cardiovascular: Normal rate and regular rhythm.   Pulmonary/Chest: Effort normal and breath sounds normal.  Abdominal: Soft. Bowel sounds are  normal. There is no tenderness. There is no guarding.  Musculoskeletal: Normal range of motion. She exhibits no edema.  Neurological: She is alert and oriented to person, place, and time. She has normal reflexes. No cranial nerve deficit. Coordination normal.  Skin: Skin is warm and dry. No rash noted. No erythema. No pallor.  Psychiatric: She has a normal mood and affect. Judgment normal.    Results for orders placed or performed in visit on 09/08/15  Hemoglobin A1c  Result Value Ref Range   Hgb A1c MFr Bld 10.3 (A) 4.0 - 6.0 %   Complete Blood Count (Most recent): Lab Results  Component Value Date   WBC 10.6* 01/10/2015   HGB 15.8* 01/10/2015   HCT 45.6 01/10/2015   MCV 89.8 01/10/2015   PLT 172 01/10/2015   Chemistry (most recent): Lab Results  Component Value Date   NA 138 01/10/2015   K  3.4* 01/10/2015   CL 102 01/10/2015   CO2 28 01/10/2015   BUN 17 01/10/2015   CREATININE 1.09 01/10/2015      Assessment & Plan:   1. Uncontrolled type 2 diabetes mellitus with complication, with long-term current use of insulin ([place])    - Patient has currently uncontrolled symptomatic type 2 DM since 64  years of age,  with most recent A1c of 10.3 %. Recent labs reviewed.   -her diabetes is complicated by neuropathy and patient remains at a high risk for more acute and chronic complications of diabetes which include CAD, CVA, CKD, retinopathy, and neuropathy. These are all discussed in detail with the patient.  - I have counseled the patient on diet management and weight loss, by adopting a carbohydrate restricted/protein rich diet.  - Suggestion is made for patient to avoid simple carbohydrates   from their diet including Cakes , Desserts, Ice Cream,  Soda (  diet and regular) , Sweet Tea , Candies,  Chips, Cookies, Artificial Sweeteners,   and "Sugar-free" Products . This will help patient to have stable blood glucose profile and potentially avoid unintended weight gain.  - I encouraged the patient to switch to  unprocessed or minimally processed complex starch and increased protein intake (animal or plant source), fruits, and vegetables.  - Patient is advised to stick to a routine mealtimes to eat 3 meals  a day and avoid unnecessary snacks ( to snack only to correct hypoglycemia).  - The patient will be scheduled with Jearld Fenton, RDN, CDE for individualized DM education.  - I have approached patient with the following individualized plan to manage diabetes and patient agrees:   - Will increase Lantus to 60 units qhs, will increase Humalog to 18 units TIDAC plus sliding scale. - Continue Metformin 1000mg  po BID.   - Patient will be considered for incretin therapy as appropriate next visit. - Patient specific target  A1c;  LDL, HDL, Triglycerides, and  Waist Circumference  were discussed in detail.  2) BP/HTN: Controlled. Continue current medications. 3) Lipids/HPL:   continue statins. 4)  Weight/Diet: CDE Consult will be initiated , exercise, and detailed carbohydrates information provided. 5) Hypothyroidism:  Continue levothyroxine 88 g by mouth every morning.  - We discussed about correct intake of levothyroxine, at fasting, with water, separated by at least 30 minutes from breakfast, and separated by more than 4 hours from calcium, iron, multivitamins, acid reflux medications (PPIs). -Patient is made aware of the fact that thyroid hormone replacement is needed for life, dose to be adjusted by periodic monitoring of  thyroid function tests.  6) Chronic Care/Health Maintenance:  -Patient  Is on  Statin medications and encouraged to continue to follow up with Ophthalmology, Podiatrist at least yearly or according to recommendations, and advised to  stay away from smoking. I have recommended yearly flu vaccine and pneumonia vaccination at least every 5 years; moderate intensity exercise for up to 150 minutes weekly; and  sleep for at least 7 hours a day.   Patient to bring meter and  blood glucose logs during their next visit.   I advised patient to maintain close follow up with their PCP for primary care needs. Follow up plan: Return in about 3 months (around 12/23/2015) for diabetes, high blood pressure, high cholesterol, underactive thyroid, follow up with pre-visit labs, meter, and logs.  Glade Lloyd, MD Phone: [phone]  Fax: [phone]   09/22/2015, 4:49 PM

## 2015-09-22 NOTE — Patient Instructions (Signed)

## 2015-09-29 ENCOUNTER — Ambulatory Visit: Payer: Self-pay | Admitting: Gastroenterology

## 2015-10-16 ENCOUNTER — Ambulatory Visit: Payer: Self-pay | Admitting: Nutrition

## 2015-11-19 ENCOUNTER — Ambulatory Visit: Payer: Medicaid Other | Admitting: Gastroenterology

## 2015-12-24 ENCOUNTER — Ambulatory Visit: Payer: Self-pay | Admitting: Nutrition

## 2015-12-24 ENCOUNTER — Ambulatory Visit: Payer: Medicaid Other | Admitting: "Endocrinology

## 2015-12-29 ENCOUNTER — Telehealth: Payer: Self-pay

## 2015-12-29 NOTE — Telephone Encounter (Signed)
FYI - pt states that she will stop taking insulin. Pt called screaming in the phone that she has not had insulin in 3 days. Then she states that she has insulin at home. She screamed that she will just stop taking it and hung up. i called her back to try to make since of what the pt is telling me but she started screaming in the phone again how her aide will not go get her medicine. Pt would not stop screaming. I told her to please call back when she could stop screaming and explain what she needs from [place].

## 2016-01-26 ENCOUNTER — Ambulatory Visit: Payer: Self-pay | Admitting: Nutrition

## 2016-02-25 ENCOUNTER — Encounter: Payer: Self-pay | Admitting: Internal Medicine

## 2016-03-03 ENCOUNTER — Other Ambulatory Visit: Payer: Self-pay | Admitting: Gastroenterology

## 2016-03-04 NOTE — Telephone Encounter (Signed)
Please call the patient and tell her since we last saw her and prescribed Viberzi, the FDA has changed it's recommendations and it is now contraindicated in patient who have had their gallbladder removed (as she has). So at this time we cannot refill Viberzi for her. She can schedule a follow-up appointment with her last GI provider to follow-up and devise a new plan.

## 2016-03-04 NOTE — Telephone Encounter (Signed)
Pt was mailed a letter about viberzi. Erline Levine, please schedule ov.

## 2016-03-05 ENCOUNTER — Encounter: Payer: Self-pay | Admitting: Internal Medicine

## 2016-03-05 NOTE — Telephone Encounter (Signed)
APPT MADE AND LETTER SENT  °

## 2016-03-29 ENCOUNTER — Encounter: Payer: Self-pay | Admitting: Gastroenterology

## 2016-03-29 ENCOUNTER — Ambulatory Visit: Payer: Self-pay | Admitting: Gastroenterology

## 2016-03-29 ENCOUNTER — Telehealth: Payer: Self-pay | Admitting: Gastroenterology

## 2016-03-29 NOTE — Telephone Encounter (Signed)
PATIENT WAS A NO SHOW AND LETTER SENT  °

## 2016-04-21 ENCOUNTER — Ambulatory Visit: Payer: Self-pay | Admitting: Gastroenterology

## 2016-05-11 ENCOUNTER — Emergency Department (HOSPITAL_COMMUNITY)
Admission: EM | Admit: 2016-05-11 | Discharge: 2016-05-11 | Disposition: A | Discharge status: Home / Self Care | Payer: Medicaid Other | Attending: Emergency Medicine | Admitting: Emergency Medicine

## 2016-05-11 ENCOUNTER — Encounter (HOSPITAL_COMMUNITY): Payer: Self-pay | Admitting: *Deleted

## 2016-05-11 ENCOUNTER — Emergency Department (HOSPITAL_COMMUNITY): Payer: Medicaid Other

## 2016-05-11 DIAGNOSIS — E119 Type 2 diabetes mellitus without complications: Secondary | ICD-10-CM | POA: Insufficient documentation

## 2016-05-11 DIAGNOSIS — Z794 Long term (current) use of insulin: Secondary | ICD-10-CM | POA: Diagnosis not present

## 2016-05-11 DIAGNOSIS — N10 Acute tubulo-interstitial nephritis: Secondary | ICD-10-CM | POA: Insufficient documentation

## 2016-05-11 DIAGNOSIS — Z79899 Other long term (current) drug therapy: Secondary | ICD-10-CM | POA: Diagnosis not present

## 2016-05-11 DIAGNOSIS — E782 Mixed hyperlipidemia: Secondary | ICD-10-CM | POA: Insufficient documentation

## 2016-05-11 DIAGNOSIS — M1990 Unspecified osteoarthritis, unspecified site: Secondary | ICD-10-CM | POA: Diagnosis not present

## 2016-05-11 DIAGNOSIS — F17210 Nicotine dependence, cigarettes, uncomplicated: Secondary | ICD-10-CM | POA: Insufficient documentation

## 2016-05-11 DIAGNOSIS — J449 Chronic obstructive pulmonary disease, unspecified: Secondary | ICD-10-CM | POA: Diagnosis not present

## 2016-05-11 DIAGNOSIS — R110 Nausea: Secondary | ICD-10-CM | POA: Diagnosis present

## 2016-05-11 DIAGNOSIS — R109 Unspecified abdominal pain: Secondary | ICD-10-CM | POA: Diagnosis not present

## 2016-05-11 DIAGNOSIS — I10 Essential (primary) hypertension: Secondary | ICD-10-CM | POA: Diagnosis not present

## 2016-05-11 DIAGNOSIS — N12 Tubulo-interstitial nephritis, not specified as acute or chronic: Secondary | ICD-10-CM

## 2016-05-11 LAB — CBC WITH DIFFERENTIAL/PLATELET
Basophils Absolute: 0 10*3/uL (ref 0.0–0.1)
Basophils Relative: 0 %
Eosinophils Absolute: 0.1 10*3/uL (ref 0.0–0.7)
Eosinophils Relative: 1 %
HCT: 45.6 % (ref 36.0–46.0)
Hemoglobin: 16.1 g/dL — ABNORMAL HIGH (ref 12.0–15.0)
Lymphocytes Relative: 8 %
Lymphs Abs: 1 10*3/uL (ref 0.7–4.0)
MCH: 31.2 pg (ref 26.0–34.0)
MCHC: 35.3 g/dL (ref 30.0–36.0)
MCV: 88.4 fL (ref 78.0–100.0)
Monocytes Absolute: 0.1 10*3/uL (ref 0.1–1.0)
Monocytes Relative: 1 %
Neutro Abs: 12.5 10*3/uL — ABNORMAL HIGH (ref 1.7–7.7)
Neutrophils Relative %: 90 %
Platelets: 169 10*3/uL (ref 150–400)
RBC: 5.16 MIL/uL — ABNORMAL HIGH (ref 3.87–5.11)
RDW: 13.6 % (ref 11.5–15.5)
WBC: 13.8 10*3/uL — ABNORMAL HIGH (ref 4.0–10.5)

## 2016-05-11 LAB — URINALYSIS, ROUTINE W REFLEX MICROSCOPIC
Bilirubin Urine: NEGATIVE
Glucose, UA: 100 mg/dL — AB
Ketones, ur: NEGATIVE mg/dL
Nitrite: POSITIVE — AB
Specific Gravity, Urine: 1.01 (ref 1.005–1.030)
pH: 5.5 (ref 5.0–8.0)

## 2016-05-11 LAB — URINE MICROSCOPIC-ADD ON

## 2016-05-11 LAB — COMPREHENSIVE METABOLIC PANEL
ALT: 25 U/L (ref 14–54)
AST: 22 U/L (ref 15–41)
Albumin: 4.1 g/dL (ref 3.5–5.0)
Alkaline Phosphatase: 129 U/L — ABNORMAL HIGH (ref 38–126)
Anion gap: 10 (ref 5–15)
BUN: 19 mg/dL (ref 6–20)
CO2: 22 mmol/L (ref 22–32)
Calcium: 9.1 mg/dL (ref 8.9–10.3)
Chloride: 102 mmol/L (ref 101–111)
Creatinine, Ser: 0.93 mg/dL (ref 0.44–1.00)
GFR calc Af Amer: >60 mL/min (ref >60)
GFR calc non Af Amer: >60 mL/min (ref >60)
Glucose, Bld: 255 mg/dL — ABNORMAL HIGH (ref 65–99)
Potassium: 3.2 mmol/L — ABNORMAL LOW (ref 3.5–5.1)
Sodium: 134 mmol/L — ABNORMAL LOW (ref 135–145)
Total Bilirubin: 1 mg/dL (ref 0.3–1.2)
Total Protein: 6.9 g/dL (ref 6.5–8.1)

## 2016-05-11 LAB — LIPASE, BLOOD: Lipase: 25 U/L (ref 11–51)

## 2016-05-11 LAB — I-STAT TROPONIN, ED: Troponin i, poc: 0 ng/mL (ref 0.00–0.08)

## 2016-05-11 IMAGING — CT CT ABD-PELV W/ CM
2 of 4 series · 16 of 46 positions shown, 18 images · IV contrast (iopamidol)
Comparison: CT [DATE], [DATE]

CLINICAL DATA: 64-year-old female with a history of flank pain

EXAM:
CT ABDOMEN AND PELVIS WITH CONTRAST
TECHNIQUE: Multidetector CT imaging of the abdomen and pelvis was performed
using the standard protocol following bolus administration of
intravenous contrast.
CONTRAST:  100mL [5I] IOPAMIDOL ([5I]) INJECTION 61%

[Series 2: routine abd pel with · axial · 0.78mm/px · z∈[+704,+1128]mm · 13 of 95 slices shown, 15 images]
[im 5/95  soft-tissue]
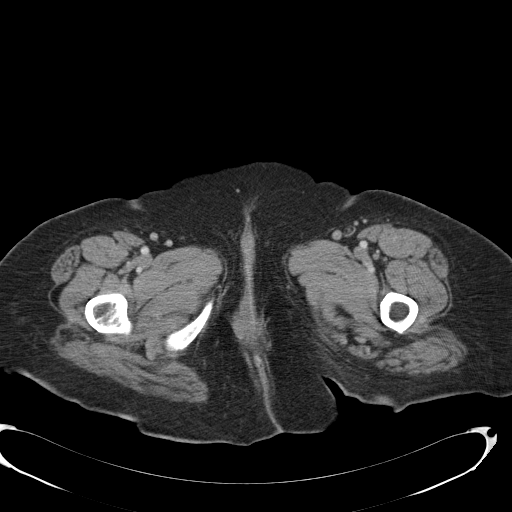
[im 5/95  bone]
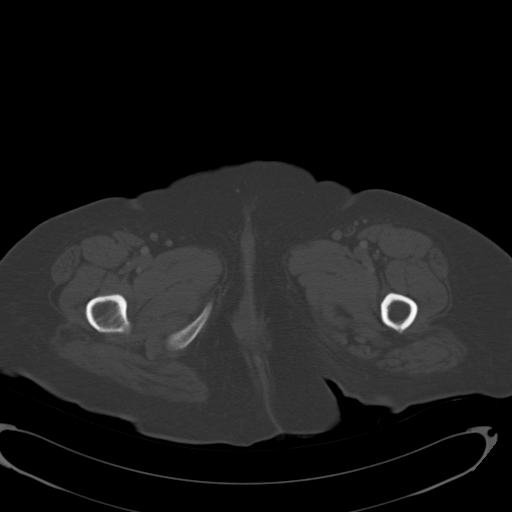
[im 14/95  soft-tissue]
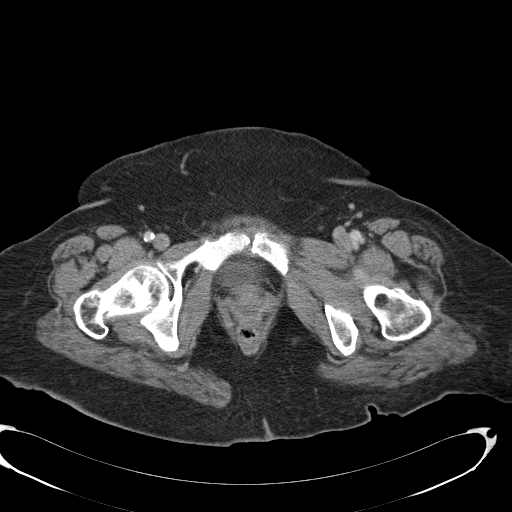
[im 18/95  soft-tissue]
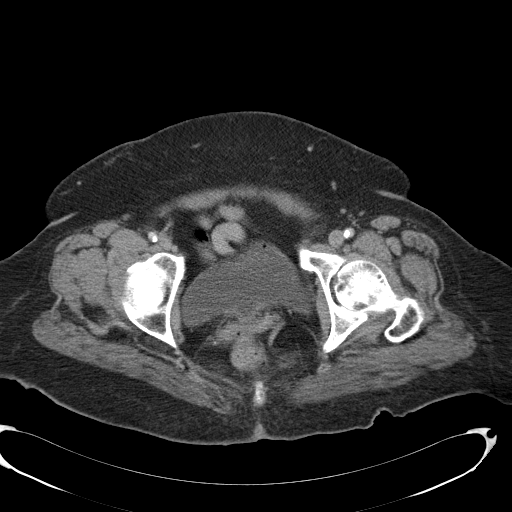
[im 27/95  soft-tissue]
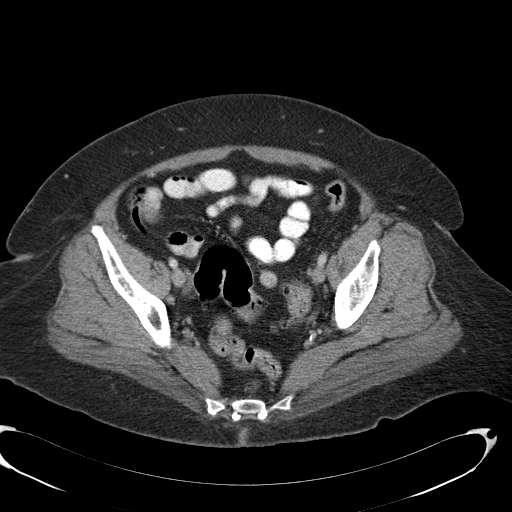
[im 32/95  soft-tissue]
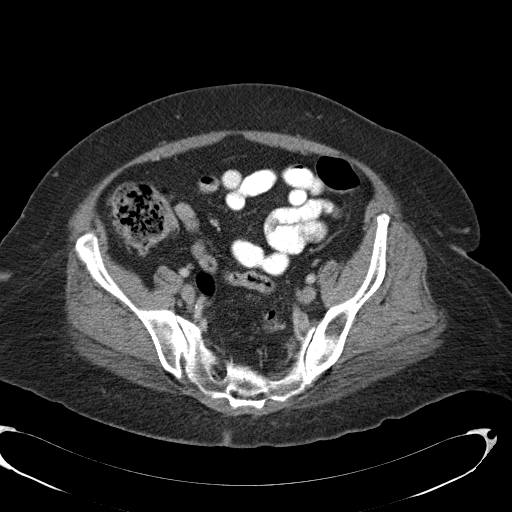
[im 41/95  soft-tissue]
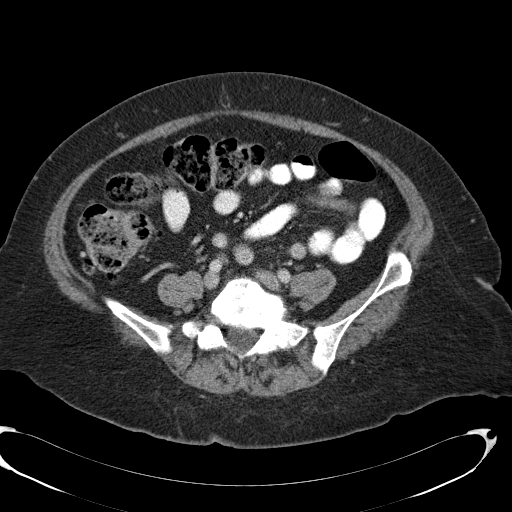
[im 50/95  soft-tissue]
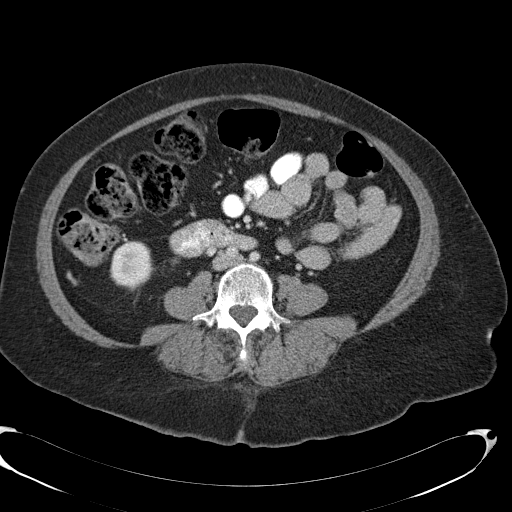
[im 54/95  soft-tissue]
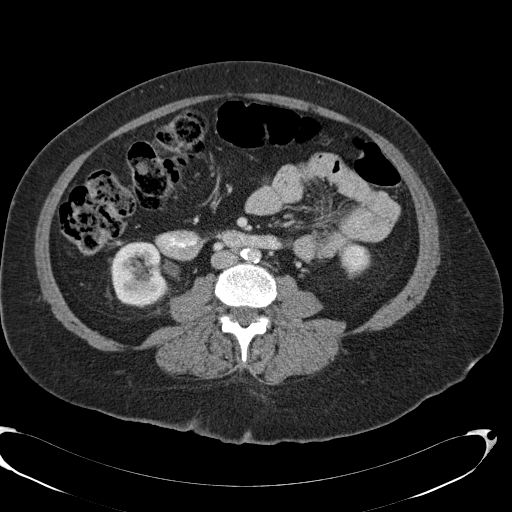
[im 63/95  soft-tissue]
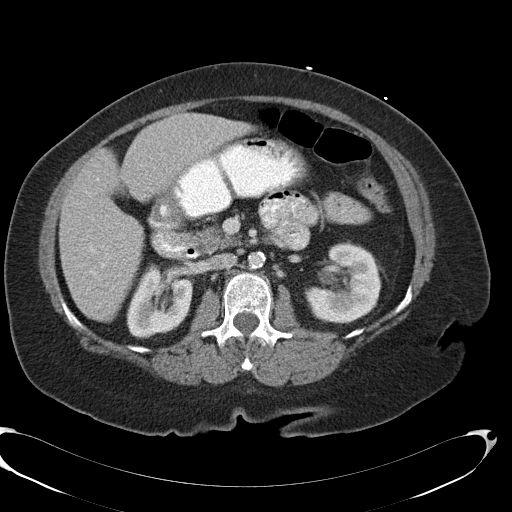
[im 63/95  bone]
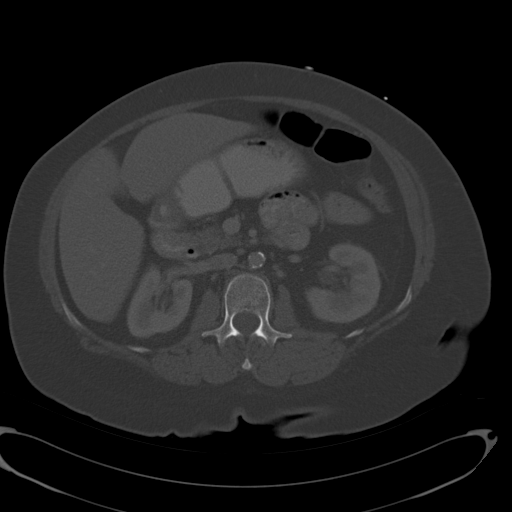
[im 68/95  soft-tissue]
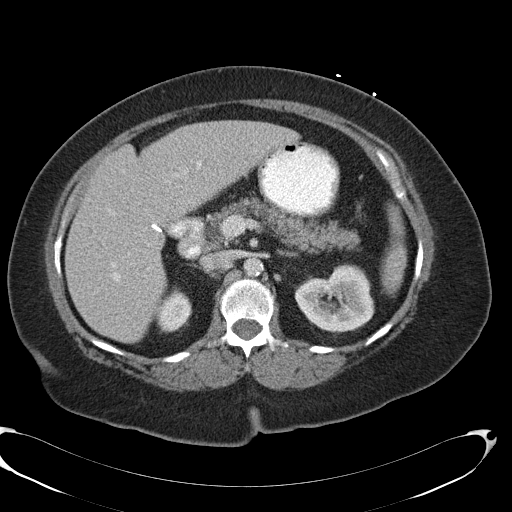
[im 77/95  soft-tissue]
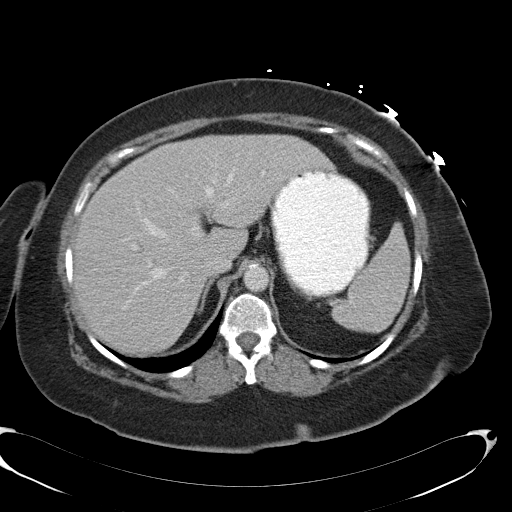
[im 81/95  soft-tissue]
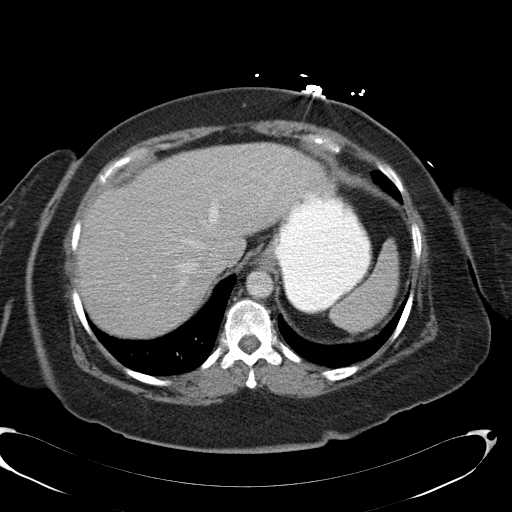
[im 90/95  soft-tissue]
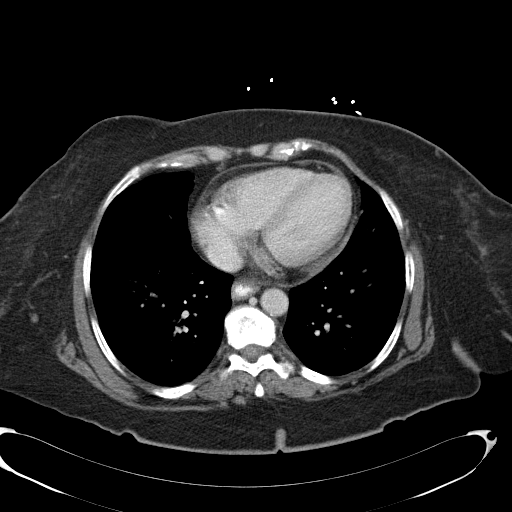

[Series 3: coronal · coronal · 0.84mm/px · 3 of 134 slices shown]
[im 45/134  soft-tissue]
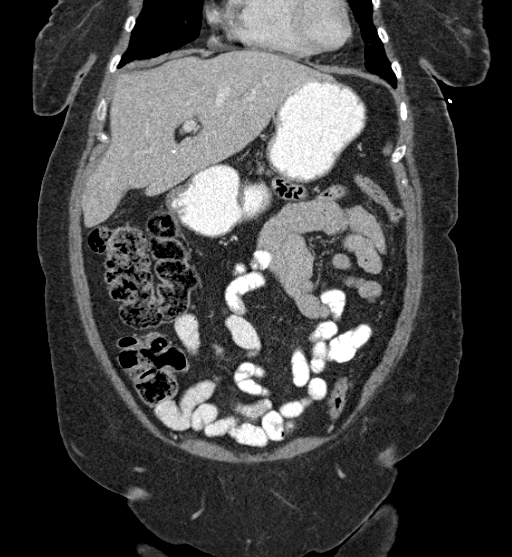
[im 60/134  soft-tissue]
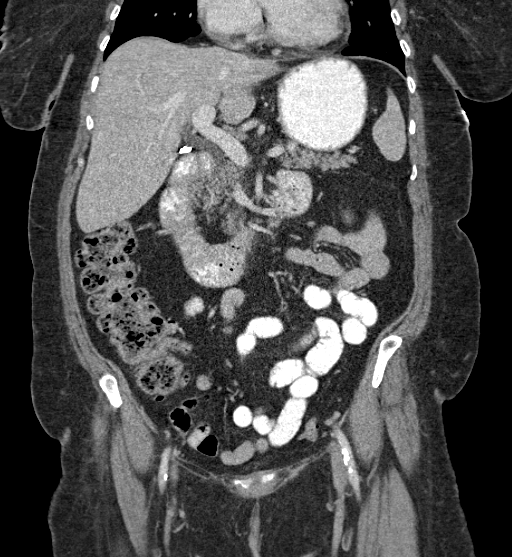
[im 74/134  soft-tissue]
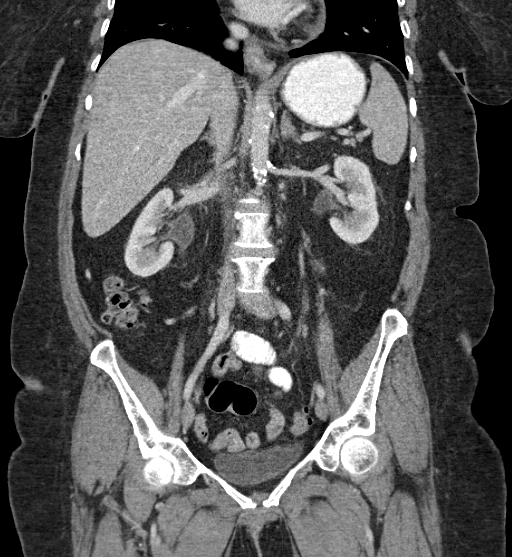

[16 of 46 positions shown; findings below may reference images not displayed]

FINDINGS: Lower chest:

Unremarkable appearance of the soft tissues of the chest wall.

Heart size within normal limits.  No pericardial fluid/thickening.

No lower mediastinal adenopathy.

Unremarkable appearance of the distal esophagus.

Small hiatal hernia

No confluent airspace disease, pleural fluid, or pneumothorax within
visualized lung.

Abdomen/pelvis:

Punctate calcifications of liver and spleen parenchyma compatible
with prior granulomatous disease.

Unremarkable appearance of the adrenal glands.

Unremarkable appearance of the pancreas.

Surgical changes of cholecystectomy.

No free intraperitoneal air. No significant free fluid or
inflammatory changes of the mesenteric.

Unremarkable appearance of distal thoracic esophagus and stomach. No
abnormally distended small bowel or colon.

Multiple colonic diverticula. No associated inflammatory changes.
Moderate stool burden.

Normal appendix.

Questionable enhancement of the urothelial surface of right
extrarenal pelvis. No hydronephrosis. No nephrolithiasis.
Unremarkable course of the right ureter.

Questionable enhancement of the urothelial surface of the left
extrarenal pelvis. No hydronephrosis or nephrolithiasis.
Unremarkable course of the left ureter.

Unremarkable appearance of the urinary bladder.

Unremarkable appearance of uterus and adnexa.

No abdominal aortic aneurysm. Dense calcifications at the distal
abdominal aorta extending into the bilateral, right greater than
left iliac system. Minimal calcifications of the distal external
iliac arteries and common femoral arteries bilaterally.

No dissection flap or periaortic fluid.

Calcifications at the origin the mesenteric vessels, including
bilateral renal arteries.

No displaced fracture.

Degenerative changes of the visualized spine. No significant bony
canal narrowing.

Nonspecific pelvic floor laxity.
IMPRESSION: Questionable enhancement of the urothelium of the left and right
ureteropelvic junction, without hydronephrosis or nephrolithiasis.
If there is concern for ascending urinary tract infection, recommend
correlation with urinalysis.

Colonic diverticula without evidence of acute diverticulitis.

Small hiatal hernia.

Aortic atherosclerosis.

## 2016-05-11 MED ORDER — IOPAMIDOL (ISOVUE-300) INJECTION 61%
100.0000 mL | Freq: Once | INTRAVENOUS | Status: AC | PRN
  Administered 2016-05-11: 100 mL via INTRAVENOUS

## 2016-05-11 MED ORDER — DEXTROSE 5 % IV SOLN
1.0000 g | Freq: Once | INTRAVENOUS | Status: AC
  Administered 2016-05-11: 1 g via INTRAVENOUS
  Filled 2016-05-11: qty 10

## 2016-05-11 MED ORDER — CIPROFLOXACIN HCL 500 MG PO TABS: 500.0000 mg | ORAL_TABLET | Freq: Two times a day (BID) | ORAL | Status: DC

## 2016-05-11 MED ORDER — SODIUM CHLORIDE 0.9 % IV BOLUS (SEPSIS)
1000.0000 mL | Freq: Once | INTRAVENOUS | Status: AC
  Administered 2016-05-11: 1000 mL via INTRAVENOUS

## 2016-05-11 MED ORDER — ONDANSETRON HCL 4 MG/2ML IJ SOLN
4.0000 mg | Freq: Once | INTRAMUSCULAR | Status: AC
Start: 2016-05-11 — End: 2016-05-11
  Administered 2016-05-11: 4 mg via INTRAVENOUS
  Filled 2016-05-11: qty 2

## 2016-05-11 MED ORDER — MORPHINE SULFATE (PF) 2 MG/ML IV SOLN
4.0000 mg | Freq: Once | INTRAVENOUS | Status: AC
  Administered 2016-05-11: 4 mg via INTRAVENOUS
  Filled 2016-05-11: qty 2

## 2016-05-11 MED ORDER — DIATRIZOATE MEGLUMINE & SODIUM 66-10 % PO SOLN
ORAL | Status: AC
  Filled 2016-05-11: qty 30

## 2016-05-11 NOTE — ED Notes (Signed)
Pt c/o back and neck pain and pain to right flank; pt states she has been having chills and nausea x 1.5 hours

## 2016-05-11 NOTE — ED Provider Notes (Signed)
CSN: [id number]     Arrival date & time 05/11/16  0654 History   First MD Initiated Contact with Patient 05/11/16 [phone]     Chief Complaint  Patient presents with  . Nausea     (Consider location/radiation/quality/duration/timing/severity/associated sxs/prior Treatment) HPI  65 year old female who presents with nausea and chills. Reports that upon waking up this morning at about 5:30-6 AM, she developed chills and felt really cold. It was associated pain in her right flank and in the back of her neck. 7/10 pain in the right flank currently. Pain in the neck only when she turns her head to the side or towards the side. She was concerned that feeling really cold was related to her thyroid problem. No fever, no vomiting. History of IBS with stable diarrhea. No dysuria, hematuria, or urinary frequency. Denies fall or trauma. No chest pain, dyspnea, lightheadedness or syncope.   Past Medical History  Diagnosis Date  . Essential hypertension, benign   . Type 2 diabetes mellitus ([facility])   . Hypothyroidism   . Mixed hyperlipidemia   . Hx of colonic polyps   . Stress incontinence, female   . S/P colonoscopy Jan 2011    RMR: pancolonic diverticula, polypoid rectal mucosa with  prominent lymphoid aggregates, repeat in Jan 2016   . Irritable bowel syndrome   . PTSD (post-traumatic stress disorder)   . Shortness of breath dyspnea   . COPD (chronic obstructive pulmonary disease) ([place])   . Arthritis   . Diverticulosis   . IBS (irritable bowel syndrome)   . Tobacco use   . Thyroid disease    Past Surgical History  Procedure Laterality Date  . Bilateral tubal ligation    . Colonoscopy  11/17/2009    Dr. Gala Romney: normal rectum, pancolonic diverticula, polyps benign, no adenomas.   . Esophagogastroduodenoscopy  11/23/2011    Dr. Gala Romney: Erosive reflux esophagitis; small hiatal hernia; esophagus dilated empirically  . Maloney dilation  11/23/2011    Procedure: Venia Minks DILATION;  Surgeon: Daneil Dolin,  MD;  Location: [facility];  Service: Endoscopy;  Laterality: N/A;  . Cholecystectomy  09/11/2012    Procedure: LAPAROSCOPIC CHOLECYSTECTOMY;  Surgeon: Jamesetta So, MD;  Location: [facility] ORS;  Service: General;  Laterality: N/A;  . Tubal ligation    . Breast biopsy Left 10/16/2014    negative  . Skin lesion excision      on buttocks  . Colonoscopy N/A 02/12/2015    Dr. Gala Romney: colonic diverticulosis, multiple colonic polyps (tubular adenomas), negative segmental biopsies. Surveillance in 2021   Family History  Problem Relation Age of Onset  . Coronary artery disease    . Diabetes type II    . Schizophrenia Brother   . Stroke Brother   . Cancer Mother     unsure what type  . Heart disease Father   . Heart attack Father   . Colon cancer Neg Hx    Social History  Substance Use Topics  . Smoking status: Current Every Day Smoker -- 2.00 packs/day for 31 years    Types: Cigarettes  . Smokeless tobacco: Never Used     Comment: since age 65  . Alcohol Use: No   OB History    No data available     Review of Systems 10/14 systems reviewed and are negative other than those stated in the HPI    Allergies  Review of patient's allergies indicates no known allergies.  Home Medications   Prior to Admission  medications   Medication Sig Start Date End Date Taking? Authorizing Provider  acetaminophen (TYLENOL) 500 MG tablet Take 1,000 mg by mouth every 6 (six) hours as needed.   Yes Historical Provider, MD  albuterol (PROVENTIL HFA;VENTOLIN HFA) 108 (90 BASE) MCG/ACT inhaler Inhale 2 puffs into the lungs every 6 (six) hours as needed. For shortness of breath    Yes Historical Provider, MD  amLODipine (NORVASC) 5 MG tablet Take 5 mg by mouth daily.   Yes Historical Provider, MD  aspirin 81 MG EC tablet Take 81 mg by mouth daily.     Yes Historical Provider, MD  buPROPion (WELLBUTRIN SR) 200 MG 12 hr tablet Take 200 mg by mouth 2 (two) times daily.   Yes Historical Provider, MD  cetirizine  (ZYRTEC) 10 MG tablet Take 10 mg by mouth daily.    Yes Historical Provider, MD  clonazePAM (KLONOPIN) 1 MG tablet Take 0.5 mg by mouth at bedtime as needed for anxiety.    Yes Historical Provider, MD  furosemide (LASIX) 20 MG tablet Take 20 mg by mouth daily.    Yes Historical Provider, MD  gabapentin (NEURONTIN) 100 MG capsule Take 100 mg by mouth 3 (three) times daily.    Yes Historical Provider, MD  insulin glargine (LANTUS) 100 UNIT/ML injection Inject 60 Units into the skin at bedtime.   Yes Historical Provider, MD  insulin lispro (HUMALOG) 100 UNIT/ML injection Inject 18-24 Units into the skin 3 (three) times daily as needed.   Yes Historical Provider, MD  levothyroxine (SYNTHROID, LEVOTHROID) 88 MCG tablet Take 88 mcg by mouth daily before breakfast.   Yes Historical Provider, MD  PARoxetine (PAXIL) 40 MG tablet Take 40 mg by mouth daily.   Yes Historical Provider, MD  simvastatin (ZOCOR) 40 MG tablet Take 40 mg by mouth every evening.   Yes Historical Provider, MD  traZODone (DESYREL) 50 MG tablet Take 50 mg by mouth at bedtime.   Yes Historical Provider, MD   BP 136/72 mmHg  Pulse 81  Temp(Src) 98.2 F (36.8 C) (Oral)  Resp 15  Ht 5\' 4"  (1.626 m)  Wt 185 lb (83.915 kg)  BMI 31.74 kg/m2  SpO2 94% Physical Exam Physical Exam  Nursing note and vitals reviewed. Constitutional: Non-toxic, and in no acute distress Head: Normocephalic and atraumatic.  Mouth/Throat: Oropharynx is clear and moist.  Neck: Normal range of motion. Neck supple. Tenderness to palpation of bilateral paraspinal muscles. No cervical spine tenderness. Cardiovascular: Normal rate and regular rhythm.   Pulmonary/Chest: Effort normal and breath sounds normal.  Abdominal: Soft. There is right sided tenderness. There is no rebound and no guarding. No significant CVA tenderness.  Musculoskeletal: Normal range of motion of neck and back. Pain not fully reproduced over right flank to palpation. Neurological: Alert, no  facial droop, fluent speech, moves all extremities symmetrically Skin: Skin is warm and dry.  Psychiatric: Cooperative  ED Course  Procedures (including critical care time) Labs Review Labs Reviewed  CBC WITH DIFFERENTIAL/PLATELET - Abnormal; Notable for the following:    WBC 13.8 (*)    RBC 5.16 (*)    Hemoglobin 16.1 (*)    Neutro Abs 12.5 (*)    All other components within normal limits  COMPREHENSIVE METABOLIC PANEL - Abnormal; Notable for the following:    Sodium 134 (*)    Potassium 3.2 (*)    Glucose, Bld 255 (*)    Alkaline Phosphatase 129 (*)    All other components within normal limits  URINALYSIS,  ROUTINE W REFLEX MICROSCOPIC (NOT AT [facility]) - Abnormal; Notable for the following:    APPearance CLOUDY (*)    Glucose, UA 100 (*)    Hgb urine dipstick MODERATE (*)    Protein, ur TRACE (*)    Nitrite POSITIVE (*)    Leukocytes, UA MODERATE (*)    All other components within normal limits  URINE MICROSCOPIC-ADD ON - Abnormal; Notable for the following:    Squamous Epithelial / LPF 0-5 (*)    Bacteria, UA MANY (*)    All other components within normal limits  URINE CULTURE  LIPASE, BLOOD  I-STAT TROPOININ, ED    Imaging Review Ct Abdomen Pelvis W Contrast  05/11/2016  CLINICAL DATA:  65 year old female with a history of flank pain EXAM: CT ABDOMEN AND PELVIS WITH CONTRAST TECHNIQUE: Multidetector CT imaging of the abdomen and pelvis was performed using the standard protocol following bolus administration of intravenous contrast. CONTRAST:  158mL ISOVUE-300 IOPAMIDOL (ISOVUE-300) INJECTION 61% COMPARISON:  CT 01/10/2015, 09/09/2012 FINDINGS: Lower chest: Unremarkable appearance of the soft tissues of the chest wall. Heart size within normal limits.  No pericardial fluid/thickening. No lower mediastinal adenopathy. Unremarkable appearance of the distal esophagus. Small hiatal hernia No confluent airspace disease, pleural fluid, or pneumothorax within visualized lung.  Abdomen/pelvis: Punctate calcifications of liver and spleen parenchyma compatible with prior granulomatous disease. Unremarkable appearance of the adrenal glands. Unremarkable appearance of the pancreas. Surgical changes of cholecystectomy. No free intraperitoneal air. No significant free fluid or inflammatory changes of the mesenteric. Unremarkable appearance of distal thoracic esophagus and stomach. No abnormally distended small bowel or colon. Multiple colonic diverticula. No associated inflammatory changes. Moderate stool burden. Normal appendix. Questionable enhancement of the urothelial surface of right extrarenal pelvis. No hydronephrosis. No nephrolithiasis. Unremarkable course of the right ureter. Questionable enhancement of the urothelial surface of the left extrarenal pelvis. No hydronephrosis or nephrolithiasis. Unremarkable course of the left ureter. Unremarkable appearance of the urinary bladder. Unremarkable appearance of uterus and adnexa. No abdominal aortic aneurysm. Dense calcifications at the distal abdominal aorta extending into the bilateral, right greater than left iliac system. Minimal calcifications of the distal external iliac arteries and common femoral arteries bilaterally. No dissection flap or periaortic fluid. Calcifications at the origin the mesenteric vessels, including bilateral renal arteries. No displaced fracture. Degenerative changes of the visualized spine. No significant bony canal narrowing. Nonspecific pelvic floor laxity. IMPRESSION: Questionable enhancement of the urothelium of the left and right ureteropelvic junction, without hydronephrosis or nephrolithiasis. If there is concern for ascending urinary tract infection, recommend correlation with urinalysis. Colonic diverticula without evidence of acute diverticulitis. Small hiatal hernia. Aortic atherosclerosis. Signed, Dulcy Fanny. Earleen Newport, DO [facility]  Electronically Signed   By: Corrie Mckusick D.O.   On: 05/11/2016 09:17   I have personally reviewed and evaluated these images and lab results as part of my medical decision-making.   EKG Interpretation   Date/Time:  Tuesday May 11 2016 07:31:50 EDT Ventricular Rate:  80 PR Interval:    QRS Duration: 70 QT Interval:  445 QTC Calculation: 514 R Axis:   63 Text Interpretation:  Sinus rhythm Low voltage, precordial leads  Borderline T wave abnormalities Prolonged QT interval NO acute ischemic  changes. Similar to prior EKG 07/02/2014 Confirmed by Ariatna Jester MD, Mayur Duman [phone])  on 05/11/2016 7:42:27 AM      MDM   Final diagnoses:  Pyelonephritis   65 year old female who presents with nausea, chills, and right-sided abdominal pain.  Presentation is afebrile and hemodynamically stable. She is well-appearing in no acute distress. She does have right lower quadrant and right lower back pain without true CVA tenderness. With leukocytosis of 13 on blood work. CT abdomen and pelvis shows no evidence of appendicitis or kidney stones but there is some thickening of the urothelial tissue on the right side suggestive of potential infection. UTIs consistent with infection with leukocytes, nitrites, and too numerous to count WBCs. I sent for culture. Given 1 g of ceftriaxone. We'll treat for pyelonephritis but I feel that currently she is stable for outpatient management. Given 1 week of ciprofloxacin. Strict return and follow-up instructions reviewed. She expressed understanding of all discharge instructions and felt comfortable with the plan of care.     Forde Dandy, MD 05/11/16 [phone]

## 2016-05-11 NOTE — Discharge Instructions (Signed)
Please take antibiotics as prescribed starting tomorrow morning. Return without fail for worsening symptoms, including worsening pain, fever, vomiting and unable to keep down food or fluids, despite antibiotics.  Pyelonephritis, Adult Pyelonephritis is a kidney infection. The kidneys are the organs that filter a person's blood and move waste out of the bloodstream and into the urine. Urine passes from the kidneys, through the ureters, and into the bladder. There are two main types of pyelonephritis:  Infections that come on quickly without any warning (acute pyelonephritis).  Infections that last for a long period of time (chronic pyelonephritis). In most cases, the infection clears up with treatment and does not cause further problems. More severe infections or chronic infections can sometimes spread to the bloodstream or lead to other problems with the kidneys. CAUSES This condition is usually caused by:  Bacteria traveling from the bladder to the kidney through infected urine. The urine in the bladder can become infected with bacteria from:  Bladder infection (cystitis).  Inflammation of the prostate gland (prostatitis).  Sexual intercourse, in females.  Bacteria traveling from the bloodstream to the kidney. RISK FACTORS This condition is more likely to develop in:  Pregnant women.  Older people.  People who have diabetes.  People who have kidney stones or bladder stones.  People who have other abnormalities of the kidney or ureter.  People who have a catheter placed in the bladder.  People who have cancer.  People who are sexually active.  Women who use spermicides.  People who have had a prior urinary tract infection. SYMPTOMS Symptoms of this condition include:  Frequent urination.  Strong or persistent urge to urinate.  Burning or stinging when urinating.  Abdominal pain.  Back pain.  Pain in the side or flank area.  Fever.  Chills.  Blood in the  urine, or dark urine.  Nausea.  Vomiting. DIAGNOSIS This condition may be diagnosed based on:  Medical history and physical exam.  Urine tests.  Blood tests. You may also have imaging tests of the kidneys, such as an ultrasound or CT scan. TREATMENT Treatment for this condition may depend on the severity of the infection.  If the infection is mild and is found early, you may be treated with antibiotic medicines taken by mouth. You will need to drink fluids to remain hydrated.  If the infection is more severe, you may need to stay in the hospital and receive antibiotics given directly into a vein through an IV tube. You may also need to receive fluids through an IV tube if you are not able to remain hydrated. After your hospital stay, you may need to take oral antibiotics for a period of time. Other treatments may be required, depending on the cause of the infection. HOME CARE INSTRUCTIONS Medicines  Take over-the-counter and prescription medicines only as told by your health care provider.  If you were prescribed an antibiotic medicine, take it as told by your health care provider. Do not stop taking the antibiotic even if you start to feel better. General Instructions  Drink enough fluid to keep your urine clear or pale yellow.  Avoid caffeine, tea, and carbonated beverages. They tend to irritate the bladder.  Urinate often. Avoid holding in urine for long periods of time.  Urinate before and after sex.  After a bowel movement, women should cleanse from front to back. Use each tissue only once.  Keep all follow-up visits as told by your health care provider. This is important. [place]  IF:  Your symptoms do not get better after 2 days of treatment.  Your symptoms get worse.  You have a fever. SEEK IMMEDIATE MEDICAL CARE IF:  You are unable to take your antibiotics or fluids.  You have shaking chills.  You vomit.  You have severe flank or back  pain.  You have extreme weakness or fainting.   This information is not intended to replace advice given to you by your health care provider. Make sure you discuss any questions you have with your health care provider.   Document Released: 10/25/2005 Document Revised: 07/16/2015 Document Reviewed: 02/17/2015 Elsevier Interactive Patient Education [employer].

## 2016-05-13 LAB — URINE CULTURE: Culture: >=100000 — AB

## 2016-05-14 ENCOUNTER — Telehealth: Payer: Self-pay | Admitting: *Deleted

## 2016-05-14 NOTE — ED Notes (Signed)
Post ED Visit - Positive Culture Follow-up  Culture report reviewed by antimicrobial stewardship pharmacist:  []  Elenor Quinones, Pharm.D. []  Heide Guile, Pharm.D., BCPS [x]  Parks Neptune, Pharm.D. []  Alycia Rossetti, Pharm.D., BCPS []  [place], Pharm.D., BCPS, AAHIVP []  Legrand Como, Pharm.D., BCPS, AAHIVP []  Milus Glazier, Pharm.D. []  Stephens November, [place].D.  Positive urine culture Treated with Ciprofloxacin, organism sensitive to the same and no further patient follow-up is required at this time.  Harlon Flor [facility] 05/14/2016, 9:18 AM

## 2016-05-18 ENCOUNTER — Encounter: Payer: Self-pay | Admitting: Gastroenterology

## 2016-05-18 ENCOUNTER — Ambulatory Visit (INDEPENDENT_AMBULATORY_CARE_PROVIDER_SITE_OTHER): Payer: Medicaid Other | Admitting: Gastroenterology

## 2016-05-18 VITALS — BP 129/78 | HR 98 | Temp 97.6°F | Ht 64.0 in | Wt 179.8 lb

## 2016-05-18 DIAGNOSIS — R112 Nausea with vomiting, unspecified: Secondary | ICD-10-CM | POA: Diagnosis not present

## 2016-05-18 DIAGNOSIS — R197 Diarrhea, unspecified: Secondary | ICD-10-CM

## 2016-05-18 NOTE — Patient Instructions (Signed)
I have ordered a gastric emptying study.   We may need to do an upper endoscopy at some point.\  I will see you back in 4-6 weeks.

## 2016-05-18 NOTE — Progress Notes (Signed)
Referring Provider: Rosita Fire, MD Primary Care Physician:  Rosita Fire, MD  Primary GI: Dr. Gala Romney   Chief Complaint  Patient presents with  . Diarrhea    HPI:   Claudia Keller is a 65 y.o. female presenting today with a history of adenomas, intermittent diarrhea, associated abdominal cramping. Has noted improvement with probiotics. Needs adenoma surveillance in 2021. Negative segmental biopsies. She was given Viberzi in Oct 2016 to trial. She is here to discuss other treatment options as Viberzi is now contraindicated in post-cholecystectomy patients.   Will have occasional diarrhea associated with vomiting. Has lost nearly 50 lbs from May of last year. CT scan on file from July 2017 without acute findings. Takes Imodium AD, which keeps her diarrhea more manageable. Has vomiting only with diarrhea but has nausea at ANY time. A lot of times just forces herself to vomit to "get it off" because she feels like she needs to "get it off". Not on a PPI. Feels full at times. Denies typical reflux symptoms or dysphagia. Last EGD in 2013 with esophagitis and empiric dilation.     Past Medical History  Diagnosis Date  . Essential hypertension, benign   . Type 2 diabetes mellitus ([place])   . Hypothyroidism   . Mixed hyperlipidemia   . Hx of colonic polyps   . Stress incontinence, female   . S/P colonoscopy Jan 2011    RMR: pancolonic diverticula, polypoid rectal mucosa with  prominent lymphoid aggregates, repeat in Jan 2016   . Irritable bowel syndrome   . PTSD (post-traumatic stress disorder)   . Shortness of breath dyspnea   . COPD (chronic obstructive pulmonary disease) ([place])   . Arthritis   . Diverticulosis   . IBS (irritable bowel syndrome)   . Tobacco use   . Thyroid disease     Past Surgical History  Procedure Laterality Date  . Bilateral tubal ligation    . Colonoscopy  11/17/2009    Dr. Gala Romney: normal rectum, pancolonic diverticula, polyps benign, no adenomas.   .  Esophagogastroduodenoscopy  11/23/2011    Dr. Gala Romney: Erosive reflux esophagitis; small hiatal hernia; esophagus dilated empirically  . Maloney dilation  11/23/2011    Procedure: Venia Minks DILATION;  Surgeon: Daneil Dolin, MD;  Location: [facility];  Service: Endoscopy;  Laterality: N/A;  . Cholecystectomy  09/11/2012    Procedure: LAPAROSCOPIC CHOLECYSTECTOMY;  Surgeon: Jamesetta So, MD;  Location: [facility];  Service: General;  Laterality: N/A;  . Tubal ligation    . Breast biopsy Left 10/16/2014    negative  . Skin lesion excision      on buttocks  . Colonoscopy N/A 02/12/2015    Dr. Gala Romney: colonic diverticulosis, multiple colonic polyps (tubular adenomas), negative segmental biopsies. Surveillance in 2021    Current Outpatient Prescriptions  Medication Sig Dispense Refill  . acetaminophen (TYLENOL) 500 MG tablet Take 1,000 mg by mouth every 6 (six) hours as needed.    Marland Kitchen albuterol (PROVENTIL HFA;VENTOLIN HFA) 108 (90 BASE) MCG/ACT inhaler Inhale 2 puffs into the lungs every 6 (six) hours as needed. For shortness of breath     . amLODipine (NORVASC) 5 MG tablet Take 5 mg by mouth daily.    Marland Kitchen aspirin 81 MG EC tablet Take 81 mg by mouth daily.      Marland Kitchen buPROPion (WELLBUTRIN SR) 200 MG 12 hr tablet Take 200 mg by mouth 2 (two) times daily.    . cetirizine (ZYRTEC) 10 MG tablet Take 10 mg  by mouth daily.     . ciprofloxacin (CIPRO) 500 MG tablet Take 1 tablet (500 mg total) by mouth every 12 (twelve) hours. 14 tablet 0  . furosemide (LASIX) 20 MG tablet Take 20 mg by mouth daily.     Marland Kitchen gabapentin (NEURONTIN) 100 MG capsule Take 100 mg by mouth 3 (three) times daily.     . insulin glargine (LANTUS) 100 UNIT/ML injection Inject 60 Units into the skin at bedtime.    . insulin lispro (HUMALOG) 100 UNIT/ML injection Inject 18-24 Units into the skin 3 (three) times daily as needed.    Marland Kitchen levothyroxine (SYNTHROID, LEVOTHROID) 88 MCG tablet Take 88 mcg by mouth daily before breakfast.    . PARoxetine  (PAXIL) 40 MG tablet Take 40 mg by mouth daily.    . simvastatin (ZOCOR) 40 MG tablet Take 40 mg by mouth every evening.    . traZODone (DESYREL) 50 MG tablet Take 50 mg by mouth at bedtime.    . clonazePAM (KLONOPIN) 1 MG tablet Take 0.5 mg by mouth at bedtime as needed for anxiety. Reported on 05/18/2016     No current facility-administered medications for this visit.    Allergies as of 05/18/2016  . (No Known Allergies)    Family History  Problem Relation Age of Onset  . Coronary artery disease    . Diabetes type II    . Schizophrenia Brother   . Stroke Brother   . Cancer Mother     unsure what type  . Heart disease Father   . Heart attack Father   . Colon cancer Neg Hx     Social History   Social History  . Marital Status: Legally Separated    Spouse Name: N/A  . Number of Children: N/A  . Years of Education: N/A   Social History Main Topics  . Smoking status: Current Every Day Smoker -- 2.00 packs/day for 31 years    Types: Cigarettes  . Smokeless tobacco: Never Used     Comment: 3/4 pack daily  . Alcohol Use: No  . Drug Use: No  . Sexual Activity: Yes    Birth Control/ Protection: Surgical   Other Topics Concern  . None   Social History Narrative    Review of Systems: Negative unless mentioned in HPI   Physical Exam: BP 129/78 mmHg  Pulse 98  Temp(Src) 97.6 F (36.4 C) (Oral)  Ht 5\' 4"  (1.626 m)  Wt 179 lb 12.8 oz (81.557 kg)  BMI 30.85 kg/m2 General:   Alert and oriented. No distress noted. Pleasant and cooperative.  Head:  Normocephalic and atraumatic. Eyes:  Conjuctiva clear without scleral icterus. Abdomen:  +BS, soft, non-tender and non-distended. No rebound or guarding. No HSM or masses noted. Extremities:  Without edema. Neurologic:  Alert and  oriented x4;  grossly normal neurologically. Psych:  Alert and cooperative. Normal mood and affect.

## 2016-05-20 ENCOUNTER — Encounter (HOSPITAL_COMMUNITY): Payer: Medicaid Other

## 2016-05-20 DIAGNOSIS — R1110 Vomiting, unspecified: Secondary | ICD-10-CM | POA: Insufficient documentation

## 2016-05-20 NOTE — Progress Notes (Signed)
cc'ed to pcp °

## 2016-05-20 NOTE — Assessment & Plan Note (Signed)
Persistent nausea, intermittent vomiting in setting of diabetes. Query gastroparesis. Proceed with GES. Weight loss concerning. CT on file from July 2017 without acute findings. May need updated EGD as last was in 2013. Return for close follow-up in 4-6 weeks.

## 2016-05-20 NOTE — Assessment & Plan Note (Signed)
Chronic diarrhea, with recent colonoscopy on file. Imodium appears to keep manageable. Not taking Viberzi, and we discussed how this is contraindicated now in the setting of post-cholecystectomy. Although this was originally a no-charge visit, we discussed additional health concerns, so we will now have to bill for this visit. She stated understanding.

## 2016-05-27 ENCOUNTER — Encounter (HOSPITAL_COMMUNITY)
Admission: RE | Admit: 2016-05-27 | Discharge: 2016-05-27 | Disposition: A | Discharge status: Home / Self Care | Payer: Medicaid Other | Source: Ambulatory Visit | Attending: Gastroenterology | Admitting: Gastroenterology

## 2016-05-27 ENCOUNTER — Encounter (HOSPITAL_COMMUNITY): Payer: Self-pay

## 2016-05-27 DIAGNOSIS — R112 Nausea with vomiting, unspecified: Secondary | ICD-10-CM | POA: Insufficient documentation

## 2016-05-27 DIAGNOSIS — K30 Functional dyspepsia: Secondary | ICD-10-CM | POA: Diagnosis not present

## 2016-05-27 IMAGING — NM NM GASTRIC EMPTYING
10 series · 10 of 10 positions shown · non-contrast
Comparison: CT of the abdomen and pelvis [DATE]

CLINICAL DATA: Nausea and vomiting for the last 3 years.

EXAM:
NUCLEAR MEDICINE GASTRIC EMPTYING SCAN
TECHNIQUE: After oral ingestion of radiolabeled meal, sequential abdominal
images were obtained for 4 hours. Percentage of activity emptying
the stomach was calculated at 1 hour, 2 hour, 3 hour, and 4 hours.
RADIOPHARMACEUTICALS:  2.2 mCi [PM] MDP labeled sulfur colloid
mixed with egg whites and toast, administered orally.

[Series 1: 0 min · 4.14mm/px · 1 of 1 slices shown (1 of 2)]
[im 1/1]
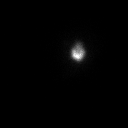

[Series 1: 0 min · 4.14mm/px · 1 of 1 slices shown (2 of 2)]
[im 1/1]
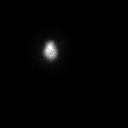

[Series 2: 60 min · 4.14mm/px · 1 of 1 slices shown (1 of 2)]
[im 1/1]
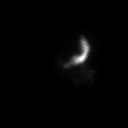

[Series 2: 60 min · 4.14mm/px · 1 of 1 slices shown (2 of 2)]
[im 1/1]
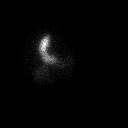

[Series 3: 120 min · 4.14mm/px · 1 of 1 slices shown (1 of 2)]
[im 1/1]
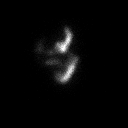

[Series 3: 120 min · 4.14mm/px · 1 of 1 slices shown (2 of 2)]
[im 1/1  full-range]
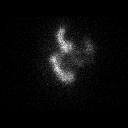

[Series 4: 180 min · 4.14mm/px · 1 of 1 slices shown (1 of 2)]
[im 1/1  full-range]
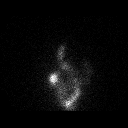

[Series 4: 180 min · 4.14mm/px · 1 of 1 slices shown (2 of 2)]
[im 1/1]
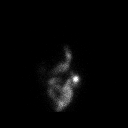

[Series 5: 240 min · 4.14mm/px · 1 of 1 slices shown (1 of 2)]
[im 1/1]
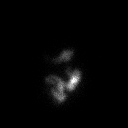

[Series 5: 240 min · 4.14mm/px · 1 of 1 slices shown (2 of 2)]
[im 1/1]
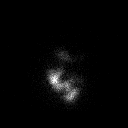

[10 of 10 positions shown; findings below may reference images not displayed]

FINDINGS: Expected location of the stomach in the left upper quadrant.
Ingested meal empties the stomach gradually over the course of the
study.

10.8 emptied at 1 hr ( normal >= 10%)

51.3 emptied at 2 hr ( normal >= 40%)

75.7 emptied at 3 hr ( normal >= 70%)

84.1 emptied at 4 hr ( normal >= 90%)
IMPRESSION: Mildly delayed gastric emptying study.

## 2016-05-27 MED ORDER — TECHNETIUM TC 99M SULFUR COLLOID
2.0000 | Freq: Once | INTRAVENOUS | Status: AC | PRN
  Administered 2016-05-27: 2 via ORAL

## 2016-06-10 ENCOUNTER — Other Ambulatory Visit: Payer: Self-pay | Admitting: Gastroenterology

## 2016-06-10 ENCOUNTER — Telehealth: Payer: Self-pay | Admitting: Internal Medicine

## 2016-06-10 MED ORDER — PANTOPRAZOLE SODIUM 40 MG PO TBEC: 40.0000 mg | DELAYED_RELEASE_TABLET | Freq: Every day | ORAL | 3 refills | Status: DC

## 2016-06-10 MED ORDER — ONDANSETRON 4 MG PO TBDP: 4.0000 mg | ORAL_TABLET | Freq: Three times a day (TID) | ORAL | 0 refills | Status: DC | PRN

## 2016-06-10 NOTE — Telephone Encounter (Signed)
See result note.  

## 2016-06-10 NOTE — Telephone Encounter (Signed)
Pt called to follow up on her results from when she went to xray. Please call [phone]

## 2016-06-10 NOTE — Telephone Encounter (Signed)
Routing to Anna

## 2016-06-10 NOTE — Progress Notes (Signed)
Mild gastroparesis in setting of diabetes. I have sent in Protonix to take each morning, 30 minutes before breakfast. Zofran to take as needed for nausea. Will hold on Reglan in this setting. Return as previously planned. Please send diet handout regarding gastroparesis.

## 2016-06-11 NOTE — Telephone Encounter (Signed)
I spoke with the pt.  

## 2016-06-23 ENCOUNTER — Telehealth: Payer: Self-pay | Admitting: Gastroenterology

## 2016-06-23 ENCOUNTER — Encounter: Payer: Self-pay | Admitting: Gastroenterology

## 2016-06-23 ENCOUNTER — Ambulatory Visit: Payer: Self-pay | Admitting: Gastroenterology

## 2016-06-23 NOTE — Telephone Encounter (Signed)
PT WAS A NO SHOW AND LETTER SENT  °

## 2016-08-04 LAB — LIPID PANEL
Cholesterol: 201 mg/dL — AB (ref 0–200)
HDL: 31 mg/dL — AB (ref 35–70)
LDL Cholesterol: 135 mg/dL
Triglycerides: 213 mg/dL — AB (ref 40–160)

## 2016-08-04 LAB — TSH: TSH: 3.77 u[IU]/mL (ref 0.41–5.90)

## 2016-08-04 LAB — HEMOGLOBIN A1C: Hemoglobin A1C: 11.8

## 2016-08-10 ENCOUNTER — Encounter (HOSPITAL_COMMUNITY): Payer: Self-pay | Admitting: Emergency Medicine

## 2016-08-10 ENCOUNTER — Emergency Department (HOSPITAL_COMMUNITY)
Admission: EM | Admit: 2016-08-10 | Discharge: 2016-08-11 | Disposition: A | Discharge status: Home / Self Care | Payer: Medicaid Other | Attending: Emergency Medicine | Admitting: Emergency Medicine

## 2016-08-10 DIAGNOSIS — Z79899 Other long term (current) drug therapy: Secondary | ICD-10-CM | POA: Insufficient documentation

## 2016-08-10 DIAGNOSIS — Z7982 Long term (current) use of aspirin: Secondary | ICD-10-CM | POA: Insufficient documentation

## 2016-08-10 DIAGNOSIS — F17210 Nicotine dependence, cigarettes, uncomplicated: Secondary | ICD-10-CM | POA: Diagnosis not present

## 2016-08-10 DIAGNOSIS — N390 Urinary tract infection, site not specified: Secondary | ICD-10-CM | POA: Diagnosis not present

## 2016-08-10 DIAGNOSIS — Z794 Long term (current) use of insulin: Secondary | ICD-10-CM | POA: Insufficient documentation

## 2016-08-10 DIAGNOSIS — E039 Hypothyroidism, unspecified: Secondary | ICD-10-CM | POA: Insufficient documentation

## 2016-08-10 DIAGNOSIS — I10 Essential (primary) hypertension: Secondary | ICD-10-CM | POA: Insufficient documentation

## 2016-08-10 DIAGNOSIS — J449 Chronic obstructive pulmonary disease, unspecified: Secondary | ICD-10-CM | POA: Diagnosis not present

## 2016-08-10 DIAGNOSIS — Z791 Long term (current) use of non-steroidal anti-inflammatories (NSAID): Secondary | ICD-10-CM | POA: Insufficient documentation

## 2016-08-10 DIAGNOSIS — M545 Low back pain: Secondary | ICD-10-CM | POA: Diagnosis present

## 2016-08-10 DIAGNOSIS — E119 Type 2 diabetes mellitus without complications: Secondary | ICD-10-CM | POA: Insufficient documentation

## 2016-08-10 LAB — URINE MICROSCOPIC-ADD ON: RBC / HPF: NONE SEEN RBC/hpf (ref 0–5)

## 2016-08-10 LAB — URINALYSIS, ROUTINE W REFLEX MICROSCOPIC
Glucose, UA: 500 mg/dL — AB
Hgb urine dipstick: NEGATIVE
Ketones, ur: 15 mg/dL — AB
Nitrite: POSITIVE — AB
Protein, ur: NEGATIVE mg/dL
Specific Gravity, Urine: >1.03 — ABNORMAL HIGH (ref 1.005–1.030)
pH: 5 (ref 5.0–8.0)

## 2016-08-10 LAB — BASIC METABOLIC PANEL
Anion gap: 10 (ref 5–15)
BUN: 25 mg/dL — ABNORMAL HIGH (ref 6–20)
CO2: 24 mmol/L (ref 22–32)
Calcium: 9.4 mg/dL (ref 8.9–10.3)
Chloride: 98 mmol/L — ABNORMAL LOW (ref 101–111)
Creatinine, Ser: 1.27 mg/dL — ABNORMAL HIGH (ref 0.44–1.00)
GFR calc Af Amer: 51 mL/min — ABNORMAL LOW (ref >60)
GFR calc non Af Amer: 44 mL/min — ABNORMAL LOW (ref >60)
Glucose, Bld: 380 mg/dL — ABNORMAL HIGH (ref 65–99)
Potassium: 3.6 mmol/L (ref 3.5–5.1)
Sodium: 132 mmol/L — ABNORMAL LOW (ref 135–145)

## 2016-08-10 LAB — CBC WITH DIFFERENTIAL/PLATELET
Basophils Absolute: 0 10*3/uL (ref 0.0–0.1)
Basophils Relative: 0 %
Eosinophils Absolute: 0.1 10*3/uL (ref 0.0–0.7)
Eosinophils Relative: 1 %
HCT: 42.8 % (ref 36.0–46.0)
Hemoglobin: 15.4 g/dL — ABNORMAL HIGH (ref 12.0–15.0)
Lymphocytes Relative: 25 %
Lymphs Abs: 2.6 10*3/uL (ref 0.7–4.0)
MCH: 31 pg (ref 26.0–34.0)
MCHC: 36 g/dL (ref 30.0–36.0)
MCV: 86.1 fL (ref 78.0–100.0)
Monocytes Absolute: 0.7 10*3/uL (ref 0.1–1.0)
Monocytes Relative: 7 %
Neutro Abs: 6.8 10*3/uL (ref 1.7–7.7)
Neutrophils Relative %: 67 %
Platelets: 158 10*3/uL (ref 150–400)
RBC: 4.97 MIL/uL (ref 3.87–5.11)
RDW: 13.4 % (ref 11.5–15.5)
WBC: 10.3 10*3/uL (ref 4.0–10.5)

## 2016-08-10 MED ORDER — CEPHALEXIN 500 MG PO CAPS: 500.0000 mg | ORAL_CAPSULE | Freq: Four times a day (QID) | ORAL | 0 refills | Status: DC

## 2016-08-10 MED ORDER — PHENAZOPYRIDINE HCL 200 MG PO TABS: 200.0000 mg | ORAL_TABLET | Freq: Three times a day (TID) | ORAL | 0 refills | Status: DC

## 2016-08-10 MED ORDER — PHENAZOPYRIDINE HCL 100 MG PO TABS
200.0000 mg | ORAL_TABLET | Freq: Once | ORAL | Status: AC
  Administered 2016-08-10: 200 mg via ORAL
  Filled 2016-08-10: qty 2

## 2016-08-10 MED ORDER — CEPHALEXIN 500 MG PO CAPS
500.0000 mg | ORAL_CAPSULE | Freq: Once | ORAL | Status: AC
  Administered 2016-08-10: 500 mg via ORAL
  Filled 2016-08-10: qty 1

## 2016-08-10 NOTE — ED Notes (Signed)
Pt attempted getting a urine sample, did not collect enough to send to the lab. Advised pt she needs to collect again when she needs to use the restroom.

## 2016-08-10 NOTE — ED Triage Notes (Signed)
Pt brought in by [facility] because pt thinks she has a UTI. Pt states she gets these regularly and the only symptoms she ever has is back pain which is what she is having now.

## 2016-08-10 NOTE — ED Provider Notes (Signed)
The patient is a 65 year old female, she has had some urinary frequency today also some lower back pain but denies fevers nausea vomiting or any other symptoms. On exam the patient has mild left lower back tenderness but no spinal tenderness, no abdominal tenderness, she appears well and is not tachycardic. Urine sample shows infection, there is also yeast, she will need antibiotics for both, she was informed of the findings, she is hyperglycemic but has not had her evening insulin, she will go home and take that. There is no anion gap acidosis.  Medical screening examination/treatment/procedure(s) were conducted as a shared visit with non-physician practitioner(s) and myself.  I personally evaluated the patient during the encounter.  Clinical Impression:   Final diagnoses:  Urinary tract infection, acute         Noemi Chapel, MD 08/12/16 1005

## 2016-08-10 NOTE — ED Provider Notes (Signed)
[place] DEPT Provider Note   CSN: [id number] Arrival date & time: 08/10/16  1959     History   Chief Complaint Chief Complaint  Patient presents with  . Back Pain    HPI Claudia Keller is a 65 y.o. female.  HPI   Claudia Keller is a 65 y.o. female who presents to the Emergency Department complaining of low back pain and urinary hesitancy for one day.  She reports hx of frequent UTI's and states the pain feels similar.  She has also noticed a dark color to her urine.  She denies hematuria, fever, chills, vomiting, abdominal pain, or recent injury.    Past Medical History:  Diagnosis Date  . Arthritis   . COPD (chronic obstructive pulmonary disease) ([place])   . Diverticulosis   . Essential hypertension, benign   . Hx of colonic polyps   . Hypothyroidism   . IBS (irritable bowel syndrome)   . Irritable bowel syndrome   . Mixed hyperlipidemia   . PTSD (post-traumatic stress disorder)   . S/P colonoscopy Jan 2011   RMR: pancolonic diverticula, polypoid rectal mucosa with  prominent lymphoid aggregates, repeat in Jan 2016   . Shortness of breath dyspnea   . Stress incontinence, female   . Thyroid disease   . Tobacco use   . Type 2 diabetes mellitus [facility])     Patient Active Problem List   Diagnosis Date Noted  . Emesis 05/20/2016  . Type II diabetes mellitus, uncontrolled ([place]) 09/08/2015  . Hyperlipidemia 09/08/2015  . Essential hypertension, benign 09/08/2015  . Primary hypothyroidism 09/08/2015  . Diverticulosis of colon without hemorrhage   . Nausea and vomiting 10/27/2011  . Chest pain 06/24/2011  . Hypercholesterolemia 06/24/2011  . Tobacco abuse 06/24/2011  . IRRITABLE BOWEL SYNDROME 11/12/2009  . DIARRHEA 11/12/2009  . OTHER SYMPTOMS INVOLVING DIGESTIVE SYSTEM OTHER 11/12/2009  . History of colonic polyps 11/12/2009  . CLOSED FRACTURE OF METATARSAL BONE 11/30/2007  . HIGH BLOOD PRESSURE 11/30/2007    Past Surgical History:  Procedure Laterality  Date  . Bilateral tubal ligation    . BREAST BIOPSY Left 10/16/2014   negative  . CHOLECYSTECTOMY  09/11/2012   Procedure: LAPAROSCOPIC CHOLECYSTECTOMY;  Surgeon: Jamesetta So, MD;  Location: [facility];  Service: General;  Laterality: N/A;  . COLONOSCOPY  11/17/2009   Dr. Gala Romney: normal rectum, pancolonic diverticula, polyps benign, no adenomas.   . COLONOSCOPY N/A 02/12/2015   Dr. Gala Romney: colonic diverticulosis, multiple colonic polyps (tubular adenomas), negative segmental biopsies. Surveillance in 2021  . ESOPHAGOGASTRODUODENOSCOPY  11/23/2011   Dr. Gala Romney: Erosive reflux esophagitis; small hiatal hernia; esophagus dilated empirically  . MALONEY DILATION  11/23/2011   Procedure: Venia Minks DILATION;  Surgeon: Daneil Dolin, MD;  Location: [facility];  Service: Endoscopy;  Laterality: N/A;  . SKIN LESION EXCISION     on buttocks  . TUBAL LIGATION      OB History    No data available       Home Medications    Prior to Admission medications   Medication Sig Start Date End Date Taking? Authorizing Provider  albuterol (PROVENTIL HFA;VENTOLIN HFA) 108 (90 BASE) MCG/ACT inhaler Inhale 2 puffs into the lungs every 6 (six) hours as needed. For shortness of breath    Yes Historical Provider, MD  amLODipine (NORVASC) 10 MG tablet Take 10 mg by mouth every morning.    Yes Historical Provider, MD  aspirin 81 MG EC tablet Take 81 mg by mouth daily.  Yes Historical Provider, MD  cetirizine (ZYRTEC) 10 MG tablet Take 10 mg by mouth every morning.    Yes Historical Provider, MD  furosemide (LASIX) 20 MG tablet Take 20 mg by mouth every morning.    Yes Historical Provider, MD  gabapentin (NEURONTIN) 100 MG capsule Take 100 mg by mouth 3 (three) times daily.    Yes Historical Provider, MD  insulin glargine (LANTUS) 100 UNIT/ML injection Inject 60 Units into the skin at bedtime.   Yes Historical Provider, MD  insulin lispro (HUMALOG) 100 UNIT/ML injection Inject 18-24 Units into the skin 3 (three) times  daily as needed.   Yes Historical Provider, MD  levothyroxine (SYNTHROID, LEVOTHROID) 125 MCG tablet Take 125 mcg by mouth daily before breakfast.   Yes Historical Provider, MD  linagliptin (TRADJENTA) 5 MG TABS tablet Take 5 mg by mouth every morning.   Yes Historical Provider, MD  mirtazapine (REMERON SOL-TAB) 15 MG disintegrating tablet Take 15 mg by mouth at bedtime.   Yes Historical Provider, MD  naproxen sodium (ALEVE) 220 MG tablet Take 220-440 mg by mouth daily as needed (for pain).   Yes Historical Provider, MD  ondansetron (ZOFRAN ODT) 4 MG disintegrating tablet Take 1 tablet (4 mg total) by mouth every 8 (eight) hours as needed for nausea or vomiting. 06/10/16  Yes Annitta Needs, NP  pantoprazole (PROTONIX) 40 MG tablet Take 1 tablet (40 mg total) by mouth daily. 30 minutes before breakfast 06/10/16  Yes Annitta Needs, NP  PARoxetine (PAXIL) 40 MG tablet Take 40 mg by mouth every morning.    Yes Historical Provider, MD  simvastatin (ZOCOR) 40 MG tablet Take 40 mg by mouth every evening.   Yes Historical Provider, MD    Family History Family History  Problem Relation Age of Onset  . Schizophrenia Brother   . Stroke Brother   . Cancer Mother     unsure what type  . Heart disease Father   . Heart attack Father   . Coronary artery disease    . Diabetes type II    . Colon cancer Neg Hx     Social History Social History  Substance Use Topics  . Smoking status: Current Every Day Smoker    Packs/day: 2.00    Years: 31.00    Types: Cigarettes  . Smokeless tobacco: Never Used     Comment: 3/4 pack daily  . Alcohol use No     Allergies   Review of patient's allergies indicates no known allergies.   Review of Systems Review of Systems  Constitutional: Negative for activity change, appetite change, chills and fever.  Respiratory: Negative for chest tightness and shortness of breath.   Gastrointestinal: Negative for abdominal pain, nausea and vomiting.  Genitourinary: Positive  for urgency. Negative for decreased urine volume, difficulty urinating, dysuria, flank pain, frequency, hematuria, vaginal bleeding and vaginal discharge.  Musculoskeletal: Positive for back pain.  Skin: Negative for rash.  Neurological: Negative for dizziness, weakness and numbness.  Hematological: Negative for adenopathy.  Psychiatric/Behavioral: Negative for confusion.  All other systems reviewed and are negative.    Physical Exam Updated Vital Signs BP 121/72 (BP Location: Left Arm)   Pulse 83   Temp 98.1 F (36.7 C) (Temporal)   Resp 14   Ht 5\' 4"  (1.626 m)   Wt 79.8 kg   SpO2 97%   BMI 30.21 kg/m   Physical Exam  Constitutional: She is oriented to person, place, and time. She appears well-developed and well-nourished.  No distress.  HENT:  Head: Normocephalic and atraumatic.  Mouth/Throat: Oropharynx is clear and moist.  Cardiovascular: Normal rate, regular rhythm and intact distal pulses.   No murmur heard. Pulmonary/Chest: Effort normal and breath sounds normal. No respiratory distress. She has no wheezes. She has no rales.  Abdominal: Soft. Normal appearance. She exhibits no distension and no mass. There is no hepatosplenomegaly. There is tenderness in the suprapubic area. There is no rigidity, no rebound, no guarding, no CVA tenderness and no tenderness at McBurney's point.  Abdomen is soft and non-tender.  No guarding or rebound.  No CVA tenderness.  No suprapubic tenderness  Musculoskeletal: Normal range of motion. She exhibits no edema.  Neurological: She is alert and oriented to person, place, and time. Coordination normal.  Skin: Skin is warm and dry. No rash noted.  Psychiatric: She has a normal mood and affect.  Nursing note and vitals reviewed.    ED Treatments / Results  Labs (all labs ordered are listed, but only abnormal results are displayed) Labs Reviewed  URINALYSIS, ROUTINE W REFLEX MICROSCOPIC (NOT AT [facility]) - Abnormal; Notable for the following:         Result Value   APPearance CLOUDY (*)    Specific Gravity, Urine >1.030 (*)    Glucose, UA 500 (*)    Bilirubin Urine SMALL (*)    Ketones, ur 15 (*)    Nitrite POSITIVE (*)    Leukocytes, UA TRACE (*)    All other components within normal limits  URINE MICROSCOPIC-ADD ON - Abnormal; Notable for the following:    Squamous Epithelial / LPF 6-30 (*)    Bacteria, UA MANY (*)    Crystals CA OXALATE CRYSTALS (*)    All other components within normal limits  URINE CULTURE  BASIC METABOLIC PANEL  CBC WITH DIFFERENTIAL/PLATELET    EKG  EKG Interpretation None       Radiology No results found.  Procedures Procedures (including critical care time)  Medications Ordered in ED Medications  cephALEXin (KEFLEX) capsule 500 mg (not administered)  phenazopyridine (PYRIDIUM) tablet 200 mg (not administered)     Initial Impression / Assessment and Plan / ED Course  I have reviewed the triage vital signs and the nursing notes.  Pertinent labs & imaging results that were available during my care of the patient were reviewed by me and considered in my medical decision making (see chart for details).  Clinical Course   Urine culture from 07/17 grew E Coli.  Sensitive to cephalosporins.  No fever, CVA tenderness or vomiting.  Abd remains soft, NT.    Will treat with Keflex and she agrees to close PMD f/u  Pt also seen by Dr. Hazle Nordmann.  Care plan discussed.    Pt is hyperglycemic, but has not taken her evening insulin. A Gap nml.   Kidney functions likely result of mild dehydration, encouraged to drink fluids and recheck with PCP.  Agrees to take her medications when she returns home.  She is non-toxic appearing and appears stable for d/c   Will start Keflex and urine has been cultured.    Final Clinical Impressions(s) / ED Diagnoses   Final diagnoses:  Urinary tract infection, acute    New Prescriptions New Prescriptions   No medications on file     Kem Parkinson,  PA-C 08/11/16 0136    Noemi Chapel, MD 08/12/16 1004

## 2016-08-10 NOTE — Discharge Instructions (Signed)
Drink plenty of water.  Take your antibiotic as directed until its finished.  Also take your insulin when you get home. Follow-up with your doctor for recheck or return here if needed.

## 2016-08-13 LAB — URINE CULTURE: Culture: >=100000 — AB

## 2016-08-14 ENCOUNTER — Telehealth (HOSPITAL_BASED_OUTPATIENT_CLINIC_OR_DEPARTMENT_OTHER): Payer: Self-pay

## 2016-08-14 NOTE — Telephone Encounter (Signed)
Post ED Visit - Positive Culture Follow-up  Culture report reviewed by antimicrobial stewardship pharmacist:  []  Elenor Quinones, Pharm.D. []  Heide Guile, Pharm.D., BCPS []  Parks Neptune, Pharm.D. []  Alycia Rossetti, Pharm.D., BCPS []  [place], Pharm.D., BCPS, AAHIVP []  Legrand Como, Pharm.D., BCPS, AAHIVP []  Milus Glazier, Pharm.D. []  Stephens November, [place].D.  Positive urine culture Treated with Cephalexin, organism sensitive to the same and no further patient follow-up is required at this time.  Deborh Pense, [place] Comment 08/14/2016, 9:00 AM

## 2016-08-17 ENCOUNTER — Encounter: Payer: Self-pay | Admitting: "Endocrinology

## 2016-08-17 ENCOUNTER — Ambulatory Visit (INDEPENDENT_AMBULATORY_CARE_PROVIDER_SITE_OTHER): Payer: Medicaid Other | Admitting: "Endocrinology

## 2016-08-17 ENCOUNTER — Encounter (HOSPITAL_COMMUNITY): Payer: Self-pay | Admitting: Emergency Medicine

## 2016-08-17 ENCOUNTER — Emergency Department (HOSPITAL_COMMUNITY)
Admission: EM | Admit: 2016-08-17 | Discharge: 2016-08-18 | Disposition: A | Discharge status: Other Acute Inpatient Hospital | Payer: Medicaid Other | Attending: Emergency Medicine | Admitting: Emergency Medicine

## 2016-08-17 VITALS — BP 144/84 | HR 85 | Ht 64.0 in | Wt 174.0 lb

## 2016-08-17 DIAGNOSIS — I10 Essential (primary) hypertension: Secondary | ICD-10-CM

## 2016-08-17 DIAGNOSIS — R45851 Suicidal ideations: Secondary | ICD-10-CM | POA: Diagnosis present

## 2016-08-17 DIAGNOSIS — N182 Chronic kidney disease, stage 2 (mild): Secondary | ICD-10-CM

## 2016-08-17 DIAGNOSIS — F4310 Post-traumatic stress disorder, unspecified: Secondary | ICD-10-CM | POA: Diagnosis not present

## 2016-08-17 DIAGNOSIS — Z7982 Long term (current) use of aspirin: Secondary | ICD-10-CM | POA: Diagnosis not present

## 2016-08-17 DIAGNOSIS — Z794 Long term (current) use of insulin: Secondary | ICD-10-CM

## 2016-08-17 DIAGNOSIS — E039 Hypothyroidism, unspecified: Secondary | ICD-10-CM | POA: Insufficient documentation

## 2016-08-17 DIAGNOSIS — E1165 Type 2 diabetes mellitus with hyperglycemia: Secondary | ICD-10-CM | POA: Diagnosis not present

## 2016-08-17 DIAGNOSIS — E7800 Pure hypercholesterolemia, unspecified: Secondary | ICD-10-CM | POA: Diagnosis not present

## 2016-08-17 DIAGNOSIS — Z79899 Other long term (current) drug therapy: Secondary | ICD-10-CM | POA: Insufficient documentation

## 2016-08-17 DIAGNOSIS — IMO0002 Reserved for concepts with insufficient information to code with codable children: Secondary | ICD-10-CM

## 2016-08-17 DIAGNOSIS — J449 Chronic obstructive pulmonary disease, unspecified: Secondary | ICD-10-CM | POA: Insufficient documentation

## 2016-08-17 DIAGNOSIS — E1122 Type 2 diabetes mellitus with diabetic chronic kidney disease: Secondary | ICD-10-CM | POA: Diagnosis not present

## 2016-08-17 DIAGNOSIS — Z91199 Patient's noncompliance with other medical treatment and regimen due to unspecified reason: Secondary | ICD-10-CM | POA: Insufficient documentation

## 2016-08-17 DIAGNOSIS — F17210 Nicotine dependence, cigarettes, uncomplicated: Secondary | ICD-10-CM | POA: Diagnosis not present

## 2016-08-17 DIAGNOSIS — Z9119 Patient's noncompliance with other medical treatment and regimen: Secondary | ICD-10-CM

## 2016-08-17 LAB — COMPREHENSIVE METABOLIC PANEL
ALT: 24 U/L (ref 14–54)
AST: 29 U/L (ref 15–41)
Albumin: 3.9 g/dL (ref 3.5–5.0)
Alkaline Phosphatase: 95 U/L (ref 38–126)
Anion gap: 10 (ref 5–15)
BUN: 15 mg/dL (ref 6–20)
CO2: 23 mmol/L (ref 22–32)
Calcium: 9.6 mg/dL (ref 8.9–10.3)
Chloride: 105 mmol/L (ref 101–111)
Creatinine, Ser: 0.81 mg/dL (ref 0.44–1.00)
GFR calc Af Amer: >60 mL/min (ref >60)
GFR calc non Af Amer: >60 mL/min (ref >60)
Glucose, Bld: 111 mg/dL — ABNORMAL HIGH (ref 65–99)
Potassium: 3.3 mmol/L — ABNORMAL LOW (ref 3.5–5.1)
Sodium: 138 mmol/L (ref 135–145)
Total Bilirubin: 1.1 mg/dL (ref 0.3–1.2)
Total Protein: 6.6 g/dL (ref 6.5–8.1)

## 2016-08-17 LAB — CBC
HCT: 43.5 % (ref 36.0–46.0)
Hemoglobin: 15.3 g/dL — ABNORMAL HIGH (ref 12.0–15.0)
MCH: 30.8 pg (ref 26.0–34.0)
MCHC: 35.2 g/dL (ref 30.0–36.0)
MCV: 87.5 fL (ref 78.0–100.0)
Platelets: 201 10*3/uL (ref 150–400)
RBC: 4.97 MIL/uL (ref 3.87–5.11)
RDW: 13.7 % (ref 11.5–15.5)
WBC: 9.6 10*3/uL (ref 4.0–10.5)

## 2016-08-17 LAB — ACETAMINOPHEN LEVEL: Acetaminophen (Tylenol), Serum: <10 ug/mL — ABNORMAL LOW (ref 10–30)

## 2016-08-17 LAB — RAPID URINE DRUG SCREEN, HOSP PERFORMED
Amphetamines: NOT DETECTED
Barbiturates: NOT DETECTED
Benzodiazepines: NOT DETECTED
Cocaine: NOT DETECTED
Opiates: NOT DETECTED
Tetrahydrocannabinol: NOT DETECTED

## 2016-08-17 LAB — SALICYLATE LEVEL: Salicylate Lvl: <7 mg/dL (ref 2.8–30.0)

## 2016-08-17 LAB — ETHANOL: Alcohol, Ethyl (B): 5 mg/dL — ABNORMAL HIGH (ref <5)

## 2016-08-17 MED ORDER — LORAZEPAM 1 MG PO TABS
1.0000 mg | ORAL_TABLET | Freq: Once | ORAL | Status: AC
Start: 2016-08-18 — End: 2016-08-18
  Administered 2016-08-18: 1 mg via ORAL
  Filled 2016-08-17: qty 1

## 2016-08-17 NOTE — Progress Notes (Signed)
Subjective:    Patient ID: Claudia Keller, female    DOB: 1951/11/06,    Past Medical History:  Diagnosis Date  . Arthritis   . COPD (chronic obstructive pulmonary disease) ([place])   . Diverticulosis   . Essential hypertension, benign   . Hx of colonic polyps   . Hypothyroidism   . IBS (irritable bowel syndrome)   . Irritable bowel syndrome   . Mixed hyperlipidemia   . PTSD (post-traumatic stress disorder)   . S/P colonoscopy Jan 2011   RMR: pancolonic diverticula, polypoid rectal mucosa with  prominent lymphoid aggregates, repeat in Jan 2016   . Shortness of breath dyspnea   . Stress incontinence, female   . Thyroid disease   . Tobacco use   . Type 2 diabetes mellitus ([place])    Past Surgical History:  Procedure Laterality Date  . Bilateral tubal ligation    . BREAST BIOPSY Left 10/16/2014   negative  . CHOLECYSTECTOMY  09/11/2012   Procedure: LAPAROSCOPIC CHOLECYSTECTOMY;  Surgeon: Jamesetta So, MD;  Location: [facility];  Service: General;  Laterality: N/A;  . COLONOSCOPY  11/17/2009   Dr. Gala Romney: normal rectum, pancolonic diverticula, polyps benign, no adenomas.   . COLONOSCOPY N/A 02/12/2015   Dr. Gala Romney: colonic diverticulosis, multiple colonic polyps (tubular adenomas), negative segmental biopsies. Surveillance in 2021  . ESOPHAGOGASTRODUODENOSCOPY  11/23/2011   Dr. Gala Romney: Erosive reflux esophagitis; small hiatal hernia; esophagus dilated empirically  . MALONEY DILATION  11/23/2011   Procedure: Venia Minks DILATION;  Surgeon: Daneil Dolin, MD;  Location: [facility];  Service: Endoscopy;  Laterality: N/A;  . SKIN LESION EXCISION     on buttocks  . TUBAL LIGATION     Social History   Social History  . Marital status: Legally Separated    Spouse name: N/A  . Number of children: N/A  . Years of education: N/A   Social History Main Topics  . Smoking status: Current Every Day Smoker    Packs/day: 2.00    Years: 31.00    Types: Cigarettes  . Smokeless tobacco: Never  Used     Comment: 3/4 pack daily  . Alcohol use No  . Drug use: No  . Sexual activity: Yes    Birth control/ protection: Surgical   Other Topics Concern  . None   Social History Narrative  . None   Outpatient Encounter Prescriptions as of 08/17/2016  Medication Sig  . albuterol (PROVENTIL HFA;VENTOLIN HFA) 108 (90 BASE) MCG/ACT inhaler Inhale 2 puffs into the lungs every 6 (six) hours as needed. For shortness of breath   . amLODipine (NORVASC) 10 MG tablet Take 10 mg by mouth every morning.   Marland Kitchen aspirin 81 MG EC tablet Take 81 mg by mouth daily.    . cephALEXin (KEFLEX) 500 MG capsule Take 1 capsule (500 mg total) by mouth 4 (four) times daily. For 7 days  . cetirizine (ZYRTEC) 10 MG tablet Take 10 mg by mouth every morning.   . furosemide (LASIX) 20 MG tablet Take 20 mg by mouth every morning.   . gabapentin (NEURONTIN) 100 MG capsule Take 100 mg by mouth 3 (three) times daily.   . insulin glargine (LANTUS) 100 UNIT/ML injection Inject 60 Units into the skin at bedtime.  . insulin lispro (HUMALOG) 100 UNIT/ML injection Inject 18-24 Units into the skin 3 (three) times daily as needed.  Marland Kitchen levothyroxine (SYNTHROID, LEVOTHROID) 125 MCG tablet Take 125 mcg by mouth daily before breakfast.  .  linagliptin (TRADJENTA) 5 MG TABS tablet Take 5 mg by mouth every morning.  . mirtazapine (REMERON SOL-TAB) 15 MG disintegrating tablet Take 15 mg by mouth at bedtime.  . naproxen sodium (ALEVE) 220 MG tablet Take 220-440 mg by mouth daily as needed (for pain).  . ondansetron (ZOFRAN ODT) 4 MG disintegrating tablet Take 1 tablet (4 mg total) by mouth every 8 (eight) hours as needed for nausea or vomiting.  . pantoprazole (PROTONIX) 40 MG tablet Take 1 tablet (40 mg total) by mouth daily. 30 minutes before breakfast  . PARoxetine (PAXIL) 40 MG tablet Take 40 mg by mouth every morning.   . simvastatin (ZOCOR) 40 MG tablet Take 40 mg by mouth every evening.  . [DISCONTINUED] phenazopyridine (PYRIDIUM) 200  MG tablet Take 1 tablet (200 mg total) by mouth 3 (three) times daily.   No facility-administered encounter medications on file as of 08/17/2016.    ALLERGIES: No Known Allergies VACCINATION STATUS:  There is no immunization history on file for this patient.  Diabetes  She presents for her follow-up diabetic visit. She has type 2 diabetes mellitus. Onset time: She was diagnosed at approximate age of 20 years. Her disease course has been worsening. There are no hypoglycemic associated symptoms. Pertinent negatives for hypoglycemia include no confusion, headaches, pallor or seizures. Associated symptoms include polydipsia and polyuria. Pertinent negatives for diabetes include no chest pain, no fatigue and no polyphagia. There are no hypoglycemic complications. Symptoms are worsening. Diabetic complications include peripheral neuropathy. Risk factors for coronary artery disease include post-menopausal, dyslipidemia and diabetes mellitus. Current diabetic treatment includes insulin injections. Her weight is stable. She is following a generally unhealthy diet. She has not had a previous visit with a dietitian. She rarely participates in exercise. Home blood sugar record trend: Patient came with no meter nor logs to review today. She admits she is not monitoring on a regular basis.  Hyperlipidemia  Pertinent negatives include no chest pain, myalgias or shortness of breath.  Hypertension  Pertinent negatives include no chest pain, headaches, palpitations or shortness of breath. Hypertensive end-organ damage includes a thyroid problem.  Thyroid Problem  Patient reports no cold intolerance, diarrhea, fatigue, heat intolerance or palpitations. Her past medical history is significant for hyperlipidemia.    Review of Systems  Constitutional: Negative for fatigue and unexpected weight change.  HENT: Negative for trouble swallowing and voice change.   Eyes: Negative for visual disturbance.  Respiratory:  Negative for cough, shortness of breath and wheezing.   Cardiovascular: Negative for chest pain, palpitations and leg swelling.  Gastrointestinal: Negative for diarrhea, nausea and vomiting.  Endocrine: Positive for polydipsia and polyuria. Negative for cold intolerance, heat intolerance and polyphagia.  Musculoskeletal: Negative for arthralgias and myalgias.  Skin: Negative for color change, pallor, rash and wound.  Neurological: Negative for seizures and headaches.  Psychiatric/Behavioral: Negative for confusion and suicidal ideas.    Objective:    BP (!) 144/84   Pulse 85   Ht 5\' 4"  (1.626 m)   Wt 174 lb (78.9 kg)   BMI 29.87 kg/m   Wt Readings from Last 3 Encounters:  08/17/16 174 lb (78.9 kg)  08/10/16 176 lb (79.8 kg)  05/18/16 179 lb 12.8 oz (81.6 kg)    Physical Exam  Constitutional: She is oriented to person, place, and time. She appears well-developed.  HENT:  Head: Normocephalic and atraumatic.  Eyes: EOM are normal.  Neck: Normal range of motion. Neck supple. No tracheal deviation present. No thyromegaly present.  Cardiovascular: Normal rate and regular rhythm.   Pulmonary/Chest: Effort normal and breath sounds normal.  Abdominal: Soft. Bowel sounds are normal. There is no tenderness. There is no guarding.  Musculoskeletal: Normal range of motion. She exhibits no edema.  Neurological: She is alert and oriented to person, place, and time. She has normal reflexes. No cranial nerve deficit. Coordination normal.  Skin: Skin is warm and dry. No rash noted. No erythema. No pallor.  Psychiatric: She has a normal mood and affect. Judgment normal.    Results for orders placed or performed in visit on 08/17/16  Lipid panel  Result Value Ref Range   Triglycerides 213 (A) 40 - 160 mg/dL   Cholesterol 201 (A) 0 - 200 mg/dL   HDL 31 (A) 35 - 70 mg/dL   LDL Cholesterol 135 mg/dL  Hemoglobin A1c  Result Value Ref Range   Hemoglobin A1C 11.8   TSH  Result Value Ref Range    TSH 3.77 0.41 - 5.90 uIU/mL   Complete Blood Count (Most recent): Lab Results  Component Value Date   WBC 10.3 08/10/2016   HGB 15.4 (H) 08/10/2016   HCT 42.8 08/10/2016   MCV 86.1 08/10/2016   PLT 158 08/10/2016   Chemistry (most recent): Lab Results  Component Value Date   NA 132 (L) 08/10/2016   K 3.6 08/10/2016   CL 98 (L) 08/10/2016   CO2 24 08/10/2016   BUN 25 (H) 08/10/2016   CREATININE 1.27 (H) 08/10/2016      Assessment & Plan:   1. Uncontrolled type 2 diabetes mellitus with complication, with long-term current use of insulin ([place])  - Patient has currently uncontrolled symptomatic type 2 DM since 65  years of age. - She no showed for her visits for more than 11 months. She returns with loss of control of diabetes with A1c of 11.8% increasing from 10.3% last visit.   Recent labs reviewed. - Unfortunately she did not bring any meter nor logs to review today.  -her diabetes is complicated by neuropathy , nephropathy, noncompliance/nonadherence, and patient remains at a high risk for more acute and chronic complications of diabetes which include CAD, CVA, CKD, retinopathy, and neuropathy. These are all discussed in detail with the patient.  - I have re-counseled the patient on diet management and weight loss, by adopting a carbohydrate restricted/protein rich diet.  - Suggestion is made for patient to avoid simple carbohydrates   from their diet including Cakes , Desserts, Ice Cream,  Soda (  diet and regular) , Sweet Tea , Candies,  Chips, Cookies, Artificial Sweeteners,   and "Sugar-free" Products . This will help patient to have stable blood glucose profile and potentially avoid unintended weight gain.  - I encouraged the patient to switch to  unprocessed or minimally processed complex starch and increased protein intake (animal or plant source), fruits, and vegetables.  - Patient is advised to stick to a routine mealtimes to eat 3 meals  a day and avoid unnecessary  snacks ( to snack only to correct hypoglycemia).  - The patient will be scheduled with Jearld Fenton, RDN, CDE for individualized DM education.  - I have approached patient with the following individualized plan to manage diabetes and patient agrees:   - Emphasizing the need for commitment to monitor blood glucose properly for optimal use of insulin, I urged her to resume Lantus  60 units qhs,  Humalog 18 units TIDAC FOR PRE-MEAL BLOOD GLUCOSE ABOVE 90 MG/DL ASSOCIATED WITH MONITORING OF  BLOOD GLUCOSE BEFORE MEALS AND AT BEDTIME.   -  Due to CK D since last visit she was taken off of metformin, and initiated on Tradjenta 5 mg by mouth daily. I advised her to continue Tradjenta and stay off of metformin for now.  - Patient specific target  A1c;  LDL, HDL, Triglycerides, and  Waist Circumference were discussed in detail.  2) BP/HTN: Controlled. Continue current medications. 3) Lipids/HPL:   continue statins. 4)  Weight/Diet: CDE Consult  has been  initiated , exercise, and detailed carbohydrates information provided.  5) Hypothyroidism:  - Her thyroid function tests are consistent with appropriate replacement. Continue levothyroxine 125 g by mouth every morning.  - We discussed about correct intake of levothyroxine, at fasting, with water, separated by at least 30 minutes from breakfast, and separated by more than 4 hours from calcium, iron, multivitamins, acid reflux medications (PPIs). -Patient is made aware of the fact that thyroid hormone replacement is needed for life, dose to be adjusted by periodic monitoring of thyroid function tests.  6) Chronic Care/Health Maintenance:  -Patient  Is on  Statin medications and encouraged to continue to follow up with Ophthalmology, Podiatrist at least yearly or according to recommendations, and advised to  stay away from smoking. I have recommended yearly flu vaccine and pneumonia vaccination at least every 5 years; moderate intensity exercise for up  to 150 minutes weekly; and  sleep for at least 7 hours a day.   Patient to bring meter and  blood glucose logs during their next visit.   I advised patient to maintain close follow up with their PCP for primary care needs. Follow up plan: Return in about 1 week (around 08/24/2016) for follow up with meter and logs- no labs.  Glade Lloyd, MD Phone: [phone]  Fax: [phone]   08/17/2016, 11:36 AM

## 2016-08-17 NOTE — Progress Notes (Signed)
Patient has been referred for inpatient geriatric treatment to the following hospitals: [facility], [place], [place], [place], [place], [place], [place].  CSW will continue to seek placement in the morning.  Verlon Setting, [place] Disposition staff 08/17/2016 11:08 PM
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
## 2016-08-17 NOTE — ED Provider Notes (Signed)
[name] DEPT Provider Note   CSN: [id number] Arrival date & time: 08/17/16  1703     History   Chief Complaint Chief Complaint  Patient presents with  . V70.1    HPI Claudia Keller is a 65 y.o. female with a past medical history significant for PTSD, COPD, hypertension, diabetes, hypothyroidism, prior suicide attempt and recent urinary tract infection who presents with suicidal ideation with plan. Patient is crying throughout initial history gathering. Patient reports that for the last several days, she has had increasing thoughts of killing herself. She said she is "a bad person"," ready to see Jesus", and, doesn't want to stay in this world". Patient says she has tried to kill herself in the past by overdose. She said she currently has a plan to kill herself by jumping out of a window. She denies any homicidal ideation or any audiovisual hallucinations. She reports that she had a recent UTI and was told by her doctor that she has "angry kidneys". She denies any other complexity specifically no fevers, chills, chest pain, shortness breath, nausea, vomiting, constipation, diarrhea, dysuria. She says she is on antibiotic for her UTI.   The history is provided by the patient and medical records. No language interpreter was used.  Mental Health Problem  Presenting symptoms: depression and suicidal thoughts   Presenting symptoms: no agitation, no hallucinations, no homicidal ideas, no suicidal threats and no suicide attempt   Degree of incapacity (severity):  Severe Onset quality:  Gradual Duration:  3 days Timing:  Constant Progression:  Worsening Chronicity:  Recurrent Context: not alcohol use   Treatment compliance:  Untreated Relieved by:  Nothing Worsened by:  Nothing Ineffective treatments:  None tried Associated symptoms: feelings of worthlessness and insomnia   Associated symptoms: no abdominal pain, no appetite change, no chest pain, no fatigue and no headaches   Risk  factors: hx of mental illness (ptsd) and hx of suicide attempts     Past Medical History:  Diagnosis Date  . Arthritis   . COPD (chronic obstructive pulmonary disease) ([place])   . Diverticulosis   . Essential hypertension, benign   . Hx of colonic polyps   . Hypothyroidism   . IBS (irritable bowel syndrome)   . Irritable bowel syndrome   . Mixed hyperlipidemia   . PTSD (post-traumatic stress disorder)   . S/P colonoscopy Jan 2011   RMR: pancolonic diverticula, polypoid rectal mucosa with  prominent lymphoid aggregates, repeat in Jan 2016   . Shortness of breath dyspnea   . Stress incontinence, female   . Thyroid disease   . Tobacco use   . Type 2 diabetes mellitus [facility])     Patient Active Problem List   Diagnosis Date Noted  . Personal history of noncompliance with medical treatment, presenting hazards to health 08/17/2016  . Emesis 05/20/2016  . Uncontrolled diabetes mellitus with stage 2 chronic kidney disease ([place]) 09/08/2015  . Essential hypertension, benign 09/08/2015  . Primary hypothyroidism 09/08/2015  . Diverticulosis of colon without hemorrhage   . Nausea and vomiting 10/27/2011  . Chest pain 06/24/2011  . Hypercholesterolemia 06/24/2011  . Tobacco abuse 06/24/2011  . IRRITABLE BOWEL SYNDROME 11/12/2009  . DIARRHEA 11/12/2009  . OTHER SYMPTOMS INVOLVING DIGESTIVE SYSTEM OTHER 11/12/2009  . History of colonic polyps 11/12/2009  . CLOSED FRACTURE OF METATARSAL BONE 11/30/2007  . HIGH BLOOD PRESSURE 11/30/2007    Past Surgical History:  Procedure Laterality Date  . Bilateral tubal ligation    . BREAST BIOPSY Left  10/16/2014   negative  . CHOLECYSTECTOMY  09/11/2012   Procedure: LAPAROSCOPIC CHOLECYSTECTOMY;  Surgeon: Claudia So, MD;  Location: [facility] ORS;  Service: General;  Laterality: N/A;  . COLONOSCOPY  11/17/2009   Dr. Gala Keller: normal rectum, pancolonic diverticula, polyps benign, no adenomas.   . COLONOSCOPY N/A 02/12/2015   Dr. Gala Keller: colonic diverticulosis,  multiple colonic polyps (tubular adenomas), negative segmental biopsies. Surveillance in 2021  . ESOPHAGOGASTRODUODENOSCOPY  11/23/2011   Dr. Gala Keller: Erosive reflux esophagitis; small hiatal hernia; esophagus dilated empirically  . MALONEY Keller  11/23/2011   Procedure: Claudia Keller;  Surgeon: Claudia Dolin, MD;  Location: [facility] END[facility] SUITE;  Service: Endoscopy;  Laterality: N/A;  . SKIN LESION EXCISION     on buttocks  . TUBAL LIGATION      OB History    Gravida Para Term Preterm AB Living   4 4 4          SAB TAB Ectopic Multiple Live Births                   Home Medications    Prior to Admission medications   Medication Sig Start Date End Date Taking? Authorizing Provider  albuterol (PROVENTIL HFA;VENTOLIN HFA) 108 (90 BASE) MCG/ACT inhaler Inhale 2 puffs into the lungs every 6 (six) hours as needed. For shortness of breath     Historical Provider, MD  amLODipine (NORVASC) 10 MG tablet Take 10 mg by mouth every morning.     Historical Provider, MD  aspirin 81 MG EC tablet Take 81 mg by mouth daily.      Historical Provider, MD  cephALEXin (KEFLEX) 500 MG capsule Take 1 capsule (500 mg total) by mouth 4 (four) times daily. For 7 days 08/10/16   Claudia Triplett, PA-C  cetirizine (ZYRTEC) 10 MG tablet Take 10 mg by mouth every morning.     Historical Provider, MD  furosemide (LASIX) 20 MG tablet Take 20 mg by mouth every morning.     Historical Provider, MD  gabapentin (NEURONTIN) 100 MG capsule Take 100 mg by mouth 3 (three) times daily.     Historical Provider, MD  insulin glargine (LANTUS) 100 UNIT/ML injection Inject 60 Units into the skin at bedtime.    Historical Provider, MD  insulin lispro (HUMALOG) 100 UNIT/ML injection Inject 18-24 Units into the skin 3 (three) times daily as needed.    Historical Provider, MD  levothyroxine (SYNTHROID, LEVOTHROID) 125 MCG tablet Take 125 mcg by mouth daily before breakfast.    Historical Provider, MD  linagliptin (TRADJENTA) 5 MG TABS  tablet Take 5 mg by mouth every morning.    Historical Provider, MD  mirtazapine (REMERON SOL-TAB) 15 MG disintegrating tablet Take 15 mg by mouth at bedtime.    Historical Provider, MD  naproxen sodium (ALEVE) 220 MG tablet Take 220-440 mg by mouth daily as needed (for pain).    Historical Provider, MD  ondansetron (ZOFRAN ODT) 4 MG disintegrating tablet Take 1 tablet (4 mg total) by mouth every 8 (eight) hours as needed for nausea or vomiting. 06/10/16   Annitta Needs, NP  pantoprazole (PROTONIX) 40 MG tablet Take 1 tablet (40 mg total) by mouth daily. 30 minutes before breakfast 06/10/16   Annitta Needs, NP  PARoxetine (PAXIL) 40 MG tablet Take 40 mg by mouth every morning.     Historical Provider, MD  simvastatin (ZOCOR) 40 MG tablet Take 40 mg by mouth every evening.    Historical Provider, MD  Family History Family History  Problem Relation Age of Onset  . Schizophrenia Brother   . Stroke Brother   . Cancer Mother     unsure what type  . Heart disease Father   . Heart attack Father   . Coronary artery disease    . Diabetes type II    . Colon cancer Neg Hx     Social History Social History  Substance Use Topics  . Smoking status: Current Every Day Smoker    Packs/day: 2.00    Years: 31.00    Types: Cigarettes  . Smokeless tobacco: Never Used     Comment: 3/4 pack daily  . Alcohol use No     Allergies   Review of patient's allergies indicates no known allergies.   Review of Systems Review of Systems  Constitutional: Negative for activity change, appetite change, chills, diaphoresis, fatigue and fever.  HENT: Negative for congestion and rhinorrhea.   Eyes: Negative for visual disturbance.  Respiratory: Negative for cough, chest tightness, shortness of breath, wheezing and stridor.   Cardiovascular: Negative for chest pain, palpitations and leg swelling.  Gastrointestinal: Negative for abdominal distention, abdominal pain, constipation, diarrhea, nausea and vomiting.    Genitourinary: Negative for difficulty urinating, dysuria, flank pain, frequency, hematuria, menstrual problem, pelvic pain, vaginal bleeding and vaginal discharge.  Musculoskeletal: Negative for back pain and neck pain.  Skin: Negative for rash and wound.  Neurological: Negative for dizziness, weakness, light-headedness, numbness and headaches.  Psychiatric/Behavioral: Positive for suicidal ideas. Negative for agitation, confusion, hallucinations and homicidal ideas. The patient has insomnia.   All other systems reviewed and are negative.    Physical Exam Updated Vital Signs BP 146/90 (BP Location: Left Arm)   Pulse 91   Temp 98.3 F (36.8 C) (Oral)   Resp 20   Ht 5\' 5"  (1.651 m)   Wt 170 lb (77.1 kg)   SpO2 96%   BMI 28.29 kg/m   Physical Exam  Constitutional: She is oriented to person, place, and time. She appears well-developed and well-nourished. No distress.  HENT:  Head: Normocephalic and atraumatic.  Right Ear: External ear normal.  Left Ear: External ear normal.  Nose: Nose normal.  Mouth/Throat: Oropharynx is clear and moist. No oropharyngeal exudate.  Eyes: Conjunctivae and EOM are normal. Pupils are equal, round, and reactive to light.  Neck: Normal range of motion. Neck supple.  Pulmonary/Chest: No stridor. No respiratory distress.  Abdominal: She exhibits no distension. There is no tenderness. There is no rebound.  Neurological: She is alert and oriented to person, place, and time. She has normal reflexes. She exhibits normal muscle tone. Coordination normal.  Skin: Skin is warm. No rash noted. She is not diaphoretic. No erythema.  Psychiatric: Her affect is labile (crying nonstop). She is not actively hallucinating. She exhibits a depressed mood. She expresses suicidal ideation. She expresses no homicidal ideation. She expresses suicidal plans. She expresses no homicidal plans.  Nursing note and vitals reviewed.    ED Treatments / Results  Labs (all labs  ordered are listed, but only abnormal results are displayed) Labs Reviewed  COMPREHENSIVE METABOLIC PANEL - Abnormal; Notable for the following:       Result Value   Potassium 3.3 (*)    Glucose, Bld 111 (*)    All other components within normal limits  ETHANOL - Abnormal; Notable for the following:    Alcohol, Ethyl (B) 5 (*)    All other components within normal limits  ACETAMINOPHEN LEVEL -  Abnormal; Notable for the following:    Acetaminophen (Tylenol), Serum <10 (*)    All other components within normal limits  CBC - Abnormal; Notable for the following:    Hemoglobin 15.3 (*)    All other components within normal limits  SALICYLATE LEVEL  RAPID URINE DRUG SCREEN, HOSP PERFORMED    EKG  EKG Interpretation None       Radiology No results found.  Procedures Procedures (including critical care time)  Medications Ordered in ED Medications  LORazepam (ATIVAN) tablet 1 mg (1 mg Oral Given 08/18/16 0005)  ziprasidone (GEODON) injection 10 mg (10 mg Intramuscular Given 08/18/16 0010)  sterile water (preservative free) injection (1.2 mLs  Given 08/18/16 0012)     Initial Impression / Assessment and Plan / ED Course  I have reviewed the triage vital signs and the nursing notes.  Pertinent labs & imaging results that were available during my care of the patient were reviewed by me and considered in my medical decision making (see chart for details).  Clinical Course    Claudia Keller is a 65 y.o. female with a past medical history significant for PTSD, COPD, hypertension, diabetes, hypothyroidism, prior suicide attempt and recent urinary tract infection who presents with suicidal ideation.   History and exam are seen above.  Patient will have screening laboratory testing.. Given patient's lack of other medical complaints, suspect patient will be medically cleared after lab testing returns. Lab testing was grossly unremarkable with no evidence of elevated  creatinine.  TTS will be consulted for further psychiatric management with her history of suicide attempt, her current SI, tearfulness, and her plan to kill herself.  TTS recommends patient be admitted to a geriatric psychiatry inpatient unit. Patient will need to remain in emergency department until placement.  Nursing reported that patient was naked and irate in her room. Patient initially given oral Ativan however, she continued to be agitated. Decision then made to give patient IM Geodon. Patient rested comfortably after administration.  Patient will await placement at geriatric psychiatry unit.   Final Clinical Impressions(s) / ED Diagnoses   Final diagnoses:  Suicidal ideation   Clinical Impression: 1. Suicidal ideation     Disposition: Eventual Admit to Geriatric Psychiatric Unit    Courtney Paris, MD 08/18/16 1346

## 2016-08-17 NOTE — Patient Instructions (Signed)
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
## 2016-08-17 NOTE — ED Triage Notes (Signed)
Pt brought in by [employer] for SI. Pt states she started having suicidal thoughts last night.

## 2016-08-17 NOTE — ED Notes (Signed)
Pt yelling and cursing. Pt stated she should have "killed herself before ever coming to this place." Pt is upset because she said she has "sat in a wet diaper all day long and that we have not done anything for her." Gave pt a new brief. Pt is now naked and refusing to put on her cloths. Pt irate and walking around room.

## 2016-08-18 ENCOUNTER — Encounter (HOSPITAL_COMMUNITY): Payer: Self-pay | Admitting: Emergency Medicine

## 2016-08-18 DIAGNOSIS — F332 Major depressive disorder, recurrent severe without psychotic features: Secondary | ICD-10-CM | POA: Insufficient documentation

## 2016-08-18 LAB — CBG MONITORING, ED
Glucose-Capillary: 351 mg/dL — ABNORMAL HIGH (ref 65–99)
Glucose-Capillary: 262 mg/dL — ABNORMAL HIGH (ref 65–99)

## 2016-08-18 MED ORDER — ZIPRASIDONE MESYLATE 20 MG IM SOLR
10.0000 mg | Freq: Once | INTRAMUSCULAR | Status: AC
  Administered 2016-08-18: 10 mg via INTRAMUSCULAR
  Filled 2016-08-18: qty 20

## 2016-08-18 MED ORDER — INSULIN ASPART 100 UNIT/ML ~~LOC~~ SOLN
18.0000 [IU] | Freq: Three times a day (TID) | SUBCUTANEOUS | Status: DC
  Administered 2016-08-18: 18 [IU] via SUBCUTANEOUS
  Filled 2016-08-18: qty 1

## 2016-08-18 MED ORDER — INSULIN ASPART 100 UNIT/ML ~~LOC~~ SOLN: 19.0000 [IU] | Freq: Three times a day (TID) | SUBCUTANEOUS | Status: DC

## 2016-08-18 MED ORDER — STERILE WATER FOR INJECTION IJ SOLN
INTRAMUSCULAR | Status: AC
  Administered 2016-08-18: 1.2 mL
  Filled 2016-08-18: qty 10

## 2016-08-18 NOTE — Progress Notes (Signed)
Mechele Claude at [facility] states admitting MD would like to accept pt for admission, however, seeing as facilities is approximately 3 hours away and pt currently voluntary status, "will not accept her if  that puts her in a position of, at discharge, being 3 hours away with no way to get home."  Inquired of [facility] to check with pt re: her resources in this matter. Will update [place].   Sharren Bridge, MSW, LCSW Clinical Social Work, Disposition  08/18/2016 [phone]

## 2016-08-18 NOTE — ED Notes (Signed)
Patient to shower with sitter and security at side.

## 2016-08-18 NOTE — ED Notes (Signed)
Pt arouses easily to voice, but remains drowsy.  Provided with warm blanket.  Denies any needs.  Given breakfast tray.

## 2016-08-18 NOTE — ED Notes (Signed)
Gave patient's son, Reyne Dumas, patient's medication and bag of belongings as requested by patient. Son states his number to call if needed is [phone].

## 2016-08-18 NOTE — ED Notes (Signed)
Spoke with pt who states she wants to be admitted somewhere for help, but has no family or friends who can transport her.  Attempting to notify Meghan at [facility].

## 2016-08-18 NOTE — ED Notes (Signed)
Patient given meal tray.

## 2016-08-18 NOTE — Progress Notes (Signed)
Pt accepted to [facility], by Dr. Launa Grill. RN report can be called at [phone].   Per Pamala Hurry, intake RN, pt can arrive anytime prior to 11pm tonight, and if transport cannot take place until then, the bed would be held until after 6am tomorrow morning. (facility does not take admissions between 11pm-6am daily).  Sharren Bridge, MSW, LCSW Clinical Social Work, Disposition  08/18/2016 [phone]

## 2016-08-25 ENCOUNTER — Ambulatory Visit: Payer: Medicaid Other | Admitting: "Endocrinology

## 2016-08-26 ENCOUNTER — Other Ambulatory Visit (HOSPITAL_COMMUNITY): Payer: Self-pay | Admitting: Internal Medicine

## 2016-08-26 DIAGNOSIS — Z1231 Encounter for screening mammogram for malignant neoplasm of breast: Secondary | ICD-10-CM

## 2016-08-31 ENCOUNTER — Other Ambulatory Visit: Payer: Medicaid Other | Admitting: Adult Health

## 2016-09-02 ENCOUNTER — Other Ambulatory Visit: Payer: Medicaid Other | Admitting: Adult Health

## 2016-09-07 ENCOUNTER — Encounter: Payer: Self-pay | Admitting: "Endocrinology

## 2016-09-07 ENCOUNTER — Ambulatory Visit (INDEPENDENT_AMBULATORY_CARE_PROVIDER_SITE_OTHER): Payer: Medicaid Other | Admitting: "Endocrinology

## 2016-09-07 VITALS — BP 136/58 | HR 82 | Ht 64.0 in | Wt 177.0 lb

## 2016-09-07 DIAGNOSIS — IMO0002 Reserved for concepts with insufficient information to code with codable children: Secondary | ICD-10-CM

## 2016-09-07 DIAGNOSIS — I10 Essential (primary) hypertension: Secondary | ICD-10-CM

## 2016-09-07 DIAGNOSIS — E039 Hypothyroidism, unspecified: Secondary | ICD-10-CM | POA: Diagnosis not present

## 2016-09-07 DIAGNOSIS — N182 Chronic kidney disease, stage 2 (mild): Secondary | ICD-10-CM | POA: Diagnosis not present

## 2016-09-07 DIAGNOSIS — E1165 Type 2 diabetes mellitus with hyperglycemia: Secondary | ICD-10-CM | POA: Diagnosis not present

## 2016-09-07 DIAGNOSIS — Z9119 Patient's noncompliance with other medical treatment and regimen: Secondary | ICD-10-CM

## 2016-09-07 DIAGNOSIS — E1122 Type 2 diabetes mellitus with diabetic chronic kidney disease: Secondary | ICD-10-CM

## 2016-09-07 DIAGNOSIS — E7800 Pure hypercholesterolemia, unspecified: Secondary | ICD-10-CM

## 2016-09-07 DIAGNOSIS — Z794 Long term (current) use of insulin: Secondary | ICD-10-CM

## 2016-09-07 DIAGNOSIS — Z91199 Patient's noncompliance with other medical treatment and regimen due to unspecified reason: Secondary | ICD-10-CM

## 2016-09-07 NOTE — Progress Notes (Signed)
Subjective:    Patient ID: Claudia Keller, female    DOB: [birth year],    Past Medical History:  Diagnosis Date  . Arthritis   . COPD (chronic obstructive pulmonary disease) ([place])   . Diverticulosis   . Essential hypertension, benign   . Hx of colonic polyps   . Hypothyroidism   . IBS (irritable bowel syndrome)   . Irritable bowel syndrome   . Mixed hyperlipidemia   . PTSD (post-traumatic stress disorder)   . S/P colonoscopy Jan 2011   RMR: pancolonic diverticula, polypoid rectal mucosa with  prominent lymphoid aggregates, repeat in Jan 2016   . Shortness of breath dyspnea   . Stress incontinence, female   . Thyroid disease   . Tobacco use   . Type 2 diabetes mellitus ([place])    Past Surgical History:  Procedure Laterality Date  . Bilateral tubal ligation    . BREAST BIOPSY Left 10/16/2014   negative  . CHOLECYSTECTOMY  09/11/2012   Procedure: LAPAROSCOPIC CHOLECYSTECTOMY;  Surgeon: Jamesetta So, MD;  Location: [facility];  Service: General;  Laterality: N/A;  . COLONOSCOPY  11/17/2009   Dr. Gala Romney: normal rectum, pancolonic diverticula, polyps benign, no adenomas.   . COLONOSCOPY N/A 02/12/2015   Dr. Gala Romney: colonic diverticulosis, multiple colonic polyps (tubular adenomas), negative segmental biopsies. Surveillance in 2021  . ESOPHAGOGASTRODUODENOSCOPY  11/23/2011   Dr. Gala Romney: Erosive reflux esophagitis; small hiatal hernia; esophagus dilated empirically  . MALONEY DILATION  11/23/2011   Procedure: Venia Minks DILATION;  Surgeon: Daneil Dolin, MD;  Location: [facility] SUITE;  Service: Endoscopy;  Laterality: N/A;  . SKIN LESION EXCISION     on buttocks  . TUBAL LIGATION     Social History   Social History  . Marital status: Legally Separated    Spouse name: N/A  . Number of children: N/A  . Years of education: N/A   Social History Main Topics  . Smoking status: Current Every Day Smoker    Packs/day: 2.00    Years: 31.00    Types: Cigarettes  . Smokeless tobacco: Never  Used     Comment: 3/4 pack daily  . Alcohol use No  . Drug use: No  . Sexual activity: Yes    Birth control/ protection: Surgical   Other Topics Concern  . None   Social History Narrative  . None   Outpatient Encounter Prescriptions as of 09/07/2016  Medication Sig  . amLODipine (NORVASC) 10 MG tablet Take 10 mg by mouth every morning.   Marland Kitchen buPROPion (WELLBUTRIN SR) 150 MG 12 hr tablet Take 150 mg by mouth 2 (two) times daily.  . cetirizine (ZYRTEC) 10 MG tablet Take 10 mg by mouth every morning.   . furosemide (LASIX) 20 MG tablet Take 20 mg by mouth every morning.   . gabapentin (NEURONTIN) 100 MG capsule Take 100 mg by mouth 3 (three) times daily.   . insulin glargine (LANTUS) 100 UNIT/ML injection Inject 60 Units into the skin at bedtime.  . insulin lispro (HUMALOG) 100 UNIT/ML injection Inject 18 Units into the skin 3 (three) times daily before meals. Sliding Scale:::      Total Meal Time 150-200= 1 unit= 19 units  201-250= 2 units=20units 251-300= 3 units=21units 301-350= 4 units=22units 351-400= 5 units=23units Greater than 400= 6 units=24units  . levothyroxine (SYNTHROID, LEVOTHROID) 125 MCG tablet Take 125 mcg by mouth daily before breakfast.  . linagliptin (TRADJENTA) 5 MG TABS tablet Take 5 mg by mouth every morning.  Marland Kitchen  mirtazapine (REMERON SOL-TAB) 15 MG disintegrating tablet Take 15 mg by mouth at bedtime.  . ondansetron (ZOFRAN ODT) 4 MG disintegrating tablet Take 1 tablet (4 mg total) by mouth every 8 (eight) hours as needed for nausea or vomiting.  . pantoprazole (PROTONIX) 40 MG tablet Take 1 tablet (40 mg total) by mouth daily. 30 minutes before breakfast  . PARoxetine (PAXIL) 40 MG tablet Take 40 mg by mouth every morning.   . simvastatin (ZOCOR) 40 MG tablet Take 40 mg by mouth every evening.  Marland Kitchen albuterol (PROVENTIL HFA;VENTOLIN HFA) 108 (90 BASE) MCG/ACT inhaler Inhale 2 puffs into the lungs every 6 (six) hours as needed. For shortness of breath   . aspirin 81  MG EC tablet Take 81 mg by mouth daily.    . cephALEXin (KEFLEX) 500 MG capsule Take 1 capsule (500 mg total) by mouth 4 (four) times daily. For 7 days  . naproxen sodium (ALEVE) 220 MG tablet Take 220-440 mg by mouth daily as needed (for pain).   No facility-administered encounter medications on file as of 09/07/2016.    ALLERGIES: No Known Allergies VACCINATION STATUS:  There is no immunization history on file for this patient.  Diabetes  She presents for her follow-up diabetic visit. She has type 2 diabetes mellitus. Onset time: She was diagnosed at approximate age of 65 years. Her disease course has been worsening. There are no hypoglycemic associated symptoms. Pertinent negatives for hypoglycemia include no confusion, headaches, pallor or seizures. Associated symptoms include polydipsia and polyuria. Pertinent negatives for diabetes include no chest pain, no fatigue and no polyphagia. There are no hypoglycemic complications. Symptoms are worsening. Diabetic complications include peripheral neuropathy. Risk factors for coronary artery disease include post-menopausal, dyslipidemia and diabetes mellitus. Current diabetic treatment includes insulin injections. Her weight is stable. She is following a generally unhealthy diet. She has not had a previous visit with a dietitian. She rarely participates in exercise. Home blood sugar record trend: Patient came with only her meter showing 1-2 readings a day despite my advice for her to monitor 4 times a day so she can use insulin safely. Her overall blood glucose range is >200 mg/dl.  Hyperlipidemia  Pertinent negatives include no chest pain, myalgias or shortness of breath.  Hypertension  Pertinent negatives include no chest pain, headaches, palpitations or shortness of breath. Hypertensive end-organ damage includes a thyroid problem.  Thyroid Problem  Patient reports no cold intolerance, diarrhea, fatigue, heat intolerance or palpitations. Her past  medical history is significant for hyperlipidemia.    Review of Systems  Constitutional: Negative for fatigue and unexpected weight change.  HENT: Negative for trouble swallowing and voice change.   Eyes: Negative for visual disturbance.  Respiratory: Negative for cough, shortness of breath and wheezing.   Cardiovascular: Negative for chest pain, palpitations and leg swelling.  Gastrointestinal: Negative for diarrhea, nausea and vomiting.  Endocrine: Positive for polydipsia and polyuria. Negative for cold intolerance, heat intolerance and polyphagia.  Musculoskeletal: Negative for arthralgias and myalgias.  Skin: Negative for color change, pallor, rash and wound.  Neurological: Negative for seizures and headaches.  Psychiatric/Behavioral: Negative for confusion and suicidal ideas.    Objective:    BP (!) 136/58   Pulse 82   Ht 5\' 4"  (1.626 m)   Wt 177 lb (80.3 kg)   BMI 30.38 kg/m   Wt Readings from Last 3 Encounters:  09/07/16 177 lb (80.3 kg)  08/17/16 170 lb (77.1 kg)  08/17/16 174 lb (78.9 kg)  Physical Exam  Constitutional: She is oriented to person, place, and time. She appears well-developed.  HENT:  Head: Normocephalic and atraumatic.  Eyes: EOM are normal.  Neck: Normal range of motion. Neck supple. No tracheal deviation present. No thyromegaly present.  Cardiovascular: Normal rate and regular rhythm.   Pulmonary/Chest: Effort normal and breath sounds normal.  Abdominal: Soft. Bowel sounds are normal. There is no tenderness. There is no guarding.  Musculoskeletal: Normal range of motion. She exhibits no edema.  Neurological: She is alert and oriented to person, place, and time. She has normal reflexes. No cranial nerve deficit. Coordination normal.  Skin: Skin is warm and dry. No rash noted. No erythema. No pallor.  Psychiatric:  Mrs. Maready is very reluctant and difficult to engage in self-care.    Results for orders placed or performed during the hospital  encounter of 08/17/16  Comprehensive metabolic panel  Result Value Ref Range   Sodium 138 135 - 145 mmol/L   Potassium 3.3 (L) 3.5 - 5.1 mmol/L   Chloride 105 101 - 111 mmol/L   CO2 23 22 - 32 mmol/L   Glucose, Bld 111 (H) 65 - 99 mg/dL   BUN 15 6 - 20 mg/dL   Creatinine, Ser 0.81 0.44 - 1.00 mg/dL   Calcium 9.6 8.9 - 10.3 mg/dL   Total Protein 6.6 6.5 - 8.1 g/dL   Albumin 3.9 3.5 - 5.0 g/dL   AST 29 15 - 41 U/L   ALT 24 14 - 54 U/L   Alkaline Phosphatase 95 38 - 126 U/L   Total Bilirubin 1.1 0.3 - 1.2 mg/dL   GFR calc non Af Amer >60 >60 mL/min   GFR calc Af Amer >60 >60 mL/min   Anion gap 10 5 - 15  Ethanol  Result Value Ref Range   Alcohol, Ethyl (B) 5 (H) <5 mg/dL  Salicylate level  Result Value Ref Range   Salicylate Lvl [id number] 2.8 - 30.0 mg/dL  Acetaminophen level  Result Value Ref Range   Acetaminophen (Tylenol), Serum <10 (L) 10 - 30 ug/mL  cbc  Result Value Ref Range   WBC 9.6 4.0 - 10.5 K/uL   RBC 4.97 3.87 - 5.11 MIL/uL   Hemoglobin 15.3 (H) 12.0 - 15.0 g/dL   HCT 43.5 36.0 - 46.0 %   MCV 87.5 78.0 - 100.0 fL   MCH 30.8 26.0 - 34.0 pg   MCHC 35.2 30.0 - 36.0 g/dL   RDW 13.7 11.5 - 15.5 %   Platelets 201 150 - 400 K/uL  Rapid urine drug screen (hospital performed)  Result Value Ref Range   Opiates NONE DETECTED NONE DETECTED   Cocaine NONE DETECTED NONE DETECTED   Benzodiazepines NONE DETECTED NONE DETECTED   Amphetamines NONE DETECTED NONE DETECTED   Tetrahydrocannabinol NONE DETECTED NONE DETECTED   Barbiturates NONE DETECTED NONE DETECTED  CBG monitoring, ED  Result Value Ref Range   Glucose-Capillary 351 (H) 65 - 99 mg/dL  CBG monitoring, ED  Result Value Ref Range   Glucose-Capillary 262 (H) 65 - 99 mg/dL   Complete Blood Count (Most recent): Lab Results  Component Value Date   WBC 9.6 08/17/2016   HGB 15.3 (H) 08/17/2016   HCT 43.5 08/17/2016   MCV 87.5 08/17/2016   PLT 201 08/17/2016   Chemistry (most recent): Lab Results  Component  Value Date   NA 138 08/17/2016   K 3.3 (L) 08/17/2016   CL 105 08/17/2016   CO2 23 08/17/2016  BUN 15 08/17/2016   CREATININE 0.81 08/17/2016      Assessment & Plan:   1. Uncontrolled type 2 diabetes mellitus with complication, with long-term current use of insulin ([place])  - Patient has currently uncontrolled symptomatic type 2 DM since 65  years of age. - She no showed for her visits for more than 11 months. She returns with loss of control of diabetes with A1c of 11.8% increasing from 10.3% last visit.   Recent labs reviewed. - Unfortunately she did not bring any meter nor logs to review today.  -her diabetes is complicated by neuropathy , nephropathy, noncompliance/nonadherence, and patient remains at a high risk for more acute and chronic complications of diabetes which include CAD, CVA, CKD, retinopathy, and neuropathy. These are all discussed in detail with the patient.  - I have re-counseled the patient on diet management and weight loss, by adopting a carbohydrate restricted/protein rich diet.  - Suggestion is made for patient to avoid simple carbohydrates   from their diet including Cakes , Desserts, Ice Cream,  Soda (  diet and regular) , Sweet Tea , Candies,  Chips, Cookies, Artificial Sweeteners,   and "Sugar-free" Products . This will help patient to have stable blood glucose profile and potentially avoid unintended weight gain.  - I encouraged the patient to switch to  unprocessed or minimally processed complex starch and increased protein intake (animal or plant source), fruits, and vegetables.  - Patient is advised to stick to a routine mealtimes to eat 3 meals  a day and avoid unnecessary snacks ( to snack only to correct hypoglycemia).  - The patient will be scheduled with Jearld Fenton, RDN, CDE for individualized DM education.  - I have approached patient with the following individualized plan to manage diabetes and patient agrees:   - Emphasizing the need for  commitment to monitor blood glucose properly for optimal use of insulin, I urged her to continue Lantus  60 units qhs,  Humalog 18 units TIDAC FOR PRE-MEAL BLOOD GLUCOSE ABOVE 90 MG/DL ASSOCIATED WITH MONITORING OF BLOOD GLUCOSE BEFORE MEALS AND AT BEDTIME.   -  Due to CK D since last visit she was taken off of metformin, and initiated on Tradjenta 5 mg by mouth daily. I advised her to continue Tradjenta and stay off of metformin for now.  - Patient specific target  A1c;  LDL, HDL, Triglycerides, and  Waist Circumference were discussed in detail.  2) BP/HTN: Controlled. Continue current medications. 3) Lipids/HPL:   continue statins. 4)  Weight/Diet: CDE Consult  has been  initiated , exercise, and detailed carbohydrates information provided.  5) Hypothyroidism:  - Her thyroid function tests are consistent with appropriate replacement. Continue levothyroxine 125 g by mouth every morning.  - We discussed about correct intake of levothyroxine, at fasting, with water, separated by at least 30 minutes from breakfast, and separated by more than 4 hours from calcium, iron, multivitamins, acid reflux medications (PPIs). -Patient is made aware of the fact that thyroid hormone replacement is needed for life, dose to be adjusted by periodic monitoring of thyroid function tests.  6) Chronic Care/Health Maintenance:  -Patient  Is on  Statin medications and encouraged to continue to follow up with Ophthalmology, Podiatrist at least yearly or according to recommendations, and advised to  stay away from smoking. I have recommended yearly flu vaccine and pneumonia vaccination at least every 5 years; moderate intensity exercise for up to 150 minutes weekly; and  sleep for at least 7  hours a day.   Patient to bring meter and  blood glucose logs during their next visit.   I advised patient to maintain close follow up with their PCP for primary care needs. Follow up plan: Return in about 3 months (around  12/08/2016).  Glade Lloyd, MD Phone: [phone]  Fax: [phone]   09/07/2016, 10:34 AM

## 2016-09-07 NOTE — Patient Instructions (Signed)

## 2016-09-08 ENCOUNTER — Other Ambulatory Visit: Payer: Self-pay | Admitting: Gastroenterology

## 2016-09-08 ENCOUNTER — Telehealth: Payer: Self-pay | Admitting: Gastroenterology

## 2016-09-08 ENCOUNTER — Encounter: Payer: Self-pay | Admitting: Gastroenterology

## 2016-09-08 ENCOUNTER — Ambulatory Visit: Payer: Self-pay | Admitting: Gastroenterology

## 2016-09-08 NOTE — Telephone Encounter (Signed)
PATIENT WAS A NO SHOW (3RD TIME ) AND LETTER SENT

## 2016-09-15 ENCOUNTER — Other Ambulatory Visit (HOSPITAL_COMMUNITY)
Admission: RE | Admit: 2016-09-15 | Discharge: 2016-09-15 | Disposition: A | Discharge status: Home / Self Care | Payer: Medicaid Other | Source: Ambulatory Visit | Attending: Adult Health | Admitting: Adult Health

## 2016-09-15 ENCOUNTER — Encounter: Payer: Self-pay | Admitting: Adult Health

## 2016-09-15 ENCOUNTER — Ambulatory Visit (INDEPENDENT_AMBULATORY_CARE_PROVIDER_SITE_OTHER): Payer: Medicaid Other | Admitting: Adult Health

## 2016-09-15 VITALS — BP 130/60 | HR 80 | Ht 64.0 in | Wt 174.0 lb

## 2016-09-15 DIAGNOSIS — Z113 Encounter for screening for infections with a predominantly sexual mode of transmission: Secondary | ICD-10-CM | POA: Diagnosis present

## 2016-09-15 DIAGNOSIS — Z01419 Encounter for gynecological examination (general) (routine) without abnormal findings: Secondary | ICD-10-CM | POA: Diagnosis present

## 2016-09-15 DIAGNOSIS — Z1211 Encounter for screening for malignant neoplasm of colon: Secondary | ICD-10-CM

## 2016-09-15 DIAGNOSIS — Z124 Encounter for screening for malignant neoplasm of cervix: Secondary | ICD-10-CM

## 2016-09-15 DIAGNOSIS — Z1151 Encounter for screening for human papillomavirus (HPV): Secondary | ICD-10-CM | POA: Diagnosis not present

## 2016-09-15 LAB — HEMOCCULT GUIAC POC 1CARD (OFFICE): Fecal Occult Blood, POC: NEGATIVE

## 2016-09-15 NOTE — Patient Instructions (Addendum)
Pap in 3 years if normal Mammogram yearly Labs with PCP Colonoscopy per GI

## 2016-09-15 NOTE — Progress Notes (Signed)
Subjective:     Patient ID: Claudia Keller, female   DOB: 16-Apr-1951, 65 y.o.   MRN: [id number]  HPI Claudia Keller is a 65 year old white female,separated in for pap and pelvic exam. PCP is Dr Legrand Rams.  Review of Systems Patient denies any headaches, hearing loss, fatigue, blurred vision, shortness of breath, chest pain, abdominal pain, problems with bowel movements or intercourse(not having sex) No joint pain or mood swings.Has UI at times,she wears depends.   Reviewed past medical,surgical, social and family history. Reviewed medications and allergies.  Objective:   Physical Exam BP 130/60 (BP Location: Left Arm, Patient Position: Sitting, Cuff Size: Normal)   Pulse 80   Ht 5\' 4"  (1.626 m)   Wt 174 lb (78.9 kg)   BMI 29.87 kg/m  Skin warm and dry.Pelvic: external genitalia is normal in appearance no lesions, vagina: decreased color,moisture and rugae,urethra has no lesions or masses noted, cervix:smooth,pap with HPV and GC/CHL performed, uterus: normal size, shape and contour, non tender, no masses felt, adnexa: no masses or tenderness noted. Bladder is non tender and no masses felt.On rectal exam has good tone, no masses or hemorrhoids felt and hemoccult was negative. PHQ 2 score 0.    Assessment:     1. Routine cervical smear   2. Encounter for screening fecal occult blood testing       Plan:      Pap in 3 years if normal Mammogram yearly Labs with PCP Colonoscopy per GI

## 2016-09-16 LAB — CYTOLOGY - PAP
Chlamydia: NEGATIVE
Diagnosis: NEGATIVE
HPV: NOT DETECTED
Neisseria Gonorrhea: NEGATIVE

## 2016-09-22 ENCOUNTER — Ambulatory Visit (HOSPITAL_COMMUNITY)
Admission: RE | Admit: 2016-09-22 | Discharge: 2016-09-22 | Disposition: A | Discharge status: Home / Self Care | Payer: Medicaid Other | Source: Ambulatory Visit | Attending: Internal Medicine | Admitting: Internal Medicine

## 2016-09-22 DIAGNOSIS — Z1231 Encounter for screening mammogram for malignant neoplasm of breast: Secondary | ICD-10-CM | POA: Insufficient documentation

## 2016-09-27 ENCOUNTER — Other Ambulatory Visit: Payer: Self-pay | Admitting: Internal Medicine

## 2016-09-27 DIAGNOSIS — R928 Other abnormal and inconclusive findings on diagnostic imaging of breast: Secondary | ICD-10-CM

## 2016-10-08 ENCOUNTER — Other Ambulatory Visit (HOSPITAL_COMMUNITY): Payer: Self-pay | Admitting: Internal Medicine

## 2016-10-08 ENCOUNTER — Other Ambulatory Visit: Payer: Self-pay | Admitting: Family Medicine

## 2016-10-08 DIAGNOSIS — R928 Other abnormal and inconclusive findings on diagnostic imaging of breast: Secondary | ICD-10-CM

## 2016-10-12 ENCOUNTER — Ambulatory Visit (HOSPITAL_COMMUNITY)
Admission: RE | Admit: 2016-10-12 | Discharge: 2016-10-12 | Disposition: A | Discharge status: Home / Self Care | Payer: Medicaid Other | Source: Ambulatory Visit | Attending: Internal Medicine | Admitting: Internal Medicine

## 2016-10-12 ENCOUNTER — Other Ambulatory Visit (HOSPITAL_COMMUNITY): Payer: Self-pay | Admitting: Internal Medicine

## 2016-10-12 ENCOUNTER — Ambulatory Visit (HOSPITAL_COMMUNITY): Admission: RE | Admit: 2016-10-12 | Payer: Medicaid Other | Source: Ambulatory Visit

## 2016-10-12 ENCOUNTER — Encounter (HOSPITAL_COMMUNITY): Payer: Self-pay

## 2016-10-12 DIAGNOSIS — R921 Mammographic calcification found on diagnostic imaging of breast: Secondary | ICD-10-CM | POA: Diagnosis not present

## 2016-10-12 DIAGNOSIS — R928 Other abnormal and inconclusive findings on diagnostic imaging of breast: Secondary | ICD-10-CM

## 2016-10-12 DIAGNOSIS — N6320 Unspecified lump in the left breast, unspecified quadrant: Secondary | ICD-10-CM

## 2016-11-03 ENCOUNTER — Emergency Department (HOSPITAL_COMMUNITY)
Admission: EM | Admit: 2016-11-03 | Discharge: 2016-11-04 | Payer: Medicaid Other | Attending: Emergency Medicine | Admitting: Emergency Medicine

## 2016-11-03 ENCOUNTER — Encounter (HOSPITAL_COMMUNITY): Payer: Self-pay | Admitting: Cardiology

## 2016-11-03 DIAGNOSIS — T50902A Poisoning by unspecified drugs, medicaments and biological substances, intentional self-harm, initial encounter: Secondary | ICD-10-CM

## 2016-11-03 DIAGNOSIS — R45851 Suicidal ideations: Secondary | ICD-10-CM | POA: Diagnosis present

## 2016-11-03 DIAGNOSIS — T1491XA Suicide attempt, initial encounter: Secondary | ICD-10-CM

## 2016-11-03 DIAGNOSIS — Z79899 Other long term (current) drug therapy: Secondary | ICD-10-CM | POA: Insufficient documentation

## 2016-11-03 DIAGNOSIS — I129 Hypertensive chronic kidney disease with stage 1 through stage 4 chronic kidney disease, or unspecified chronic kidney disease: Secondary | ICD-10-CM | POA: Insufficient documentation

## 2016-11-03 DIAGNOSIS — E039 Hypothyroidism, unspecified: Secondary | ICD-10-CM | POA: Diagnosis not present

## 2016-11-03 DIAGNOSIS — E1122 Type 2 diabetes mellitus with diabetic chronic kidney disease: Secondary | ICD-10-CM | POA: Diagnosis not present

## 2016-11-03 DIAGNOSIS — T43222A Poisoning by selective serotonin reuptake inhibitors, intentional self-harm, initial encounter: Secondary | ICD-10-CM | POA: Diagnosis not present

## 2016-11-03 DIAGNOSIS — Z7982 Long term (current) use of aspirin: Secondary | ICD-10-CM | POA: Diagnosis not present

## 2016-11-03 DIAGNOSIS — F17210 Nicotine dependence, cigarettes, uncomplicated: Secondary | ICD-10-CM | POA: Insufficient documentation

## 2016-11-03 DIAGNOSIS — N182 Chronic kidney disease, stage 2 (mild): Secondary | ICD-10-CM | POA: Insufficient documentation

## 2016-11-03 DIAGNOSIS — J449 Chronic obstructive pulmonary disease, unspecified: Secondary | ICD-10-CM | POA: Insufficient documentation

## 2016-11-03 DIAGNOSIS — Z794 Long term (current) use of insulin: Secondary | ICD-10-CM | POA: Insufficient documentation

## 2016-11-03 LAB — ETHANOL: Alcohol, Ethyl (B): <5 mg/dL (ref <5)

## 2016-11-03 LAB — CBC WITH DIFFERENTIAL/PLATELET
Basophils Absolute: 0 10*3/uL (ref 0.0–0.1)
Basophils Relative: 0 %
Eosinophils Absolute: 0.1 10*3/uL (ref 0.0–0.7)
Eosinophils Relative: 1 %
HCT: 42.4 % (ref 36.0–46.0)
Hemoglobin: 14.1 g/dL (ref 12.0–15.0)
Lymphocytes Relative: 17 %
Lymphs Abs: 1.7 10*3/uL (ref 0.7–4.0)
MCH: 30.3 pg (ref 26.0–34.0)
MCHC: 33.3 g/dL (ref 30.0–36.0)
MCV: 91.2 fL (ref 78.0–100.0)
Monocytes Absolute: 0.5 10*3/uL (ref 0.1–1.0)
Monocytes Relative: 6 %
Neutro Abs: 7.6 10*3/uL (ref 1.7–7.7)
Neutrophils Relative %: 76 %
Platelets: 266 10*3/uL (ref 150–400)
RBC: 4.65 MIL/uL (ref 3.87–5.11)
RDW: 15.2 % (ref 11.5–15.5)
WBC: 9.8 10*3/uL (ref 4.0–10.5)

## 2016-11-03 LAB — RAPID URINE DRUG SCREEN, HOSP PERFORMED
Amphetamines: NOT DETECTED
Barbiturates: NOT DETECTED
Benzodiazepines: NOT DETECTED
Cocaine: NOT DETECTED
Opiates: NOT DETECTED
Tetrahydrocannabinol: NOT DETECTED

## 2016-11-03 LAB — SALICYLATE LEVEL
Salicylate Lvl: <7 mg/dL (ref 2.8–30.0)
Salicylate Lvl: <7 mg/dL (ref 2.8–30.0)

## 2016-11-03 LAB — COMPREHENSIVE METABOLIC PANEL
ALT: 19 U/L (ref 14–54)
AST: 25 U/L (ref 15–41)
Albumin: 4 g/dL (ref 3.5–5.0)
Alkaline Phosphatase: 128 U/L — ABNORMAL HIGH (ref 38–126)
Anion gap: 8 (ref 5–15)
BUN: 16 mg/dL (ref 6–20)
CO2: 24 mmol/L (ref 22–32)
Calcium: 9.3 mg/dL (ref 8.9–10.3)
Chloride: 103 mmol/L (ref 101–111)
Creatinine, Ser: 1 mg/dL (ref 0.44–1.00)
GFR calc Af Amer: >60 mL/min (ref >60)
GFR calc non Af Amer: 58 mL/min — ABNORMAL LOW (ref >60)
Glucose, Bld: 239 mg/dL — ABNORMAL HIGH (ref 65–99)
Potassium: 3.9 mmol/L (ref 3.5–5.1)
Sodium: 135 mmol/L (ref 135–145)
Total Bilirubin: 0.5 mg/dL (ref 0.3–1.2)
Total Protein: 7 g/dL (ref 6.5–8.1)

## 2016-11-03 LAB — PROTIME-INR
INR: 0.92
Prothrombin Time: 12.4 seconds (ref 11.4–15.2)

## 2016-11-03 LAB — ACETAMINOPHEN LEVEL
Acetaminophen (Tylenol), Serum: <10 ug/mL — ABNORMAL LOW (ref 10–30)
Acetaminophen (Tylenol), Serum: <10 ug/mL — ABNORMAL LOW (ref 10–30)

## 2016-11-03 MED ORDER — MIRTAZAPINE 15 MG PO TBDP
15.0000 mg | ORAL_TABLET | Freq: Every day | ORAL | Status: DC
  Administered 2016-11-03: 15 mg via ORAL
  Filled 2016-11-03 (×2): qty 1

## 2016-11-03 MED ORDER — ALBUTEROL SULFATE HFA 108 (90 BASE) MCG/ACT IN AERS: 1.0000 | INHALATION_SPRAY | Freq: Four times a day (QID) | RESPIRATORY_TRACT | Status: DC | PRN

## 2016-11-03 MED ORDER — LINAGLIPTIN 5 MG PO TABS
5.0000 mg | ORAL_TABLET | Freq: Every day | ORAL | Status: DC
  Administered 2016-11-04: 5 mg via ORAL
  Filled 2016-11-03 (×3): qty 1

## 2016-11-03 MED ORDER — GABAPENTIN 100 MG PO CAPS
100.0000 mg | ORAL_CAPSULE | Freq: Three times a day (TID) | ORAL | Status: DC
  Administered 2016-11-03 – 2016-11-04 (×3): 100 mg via ORAL
  Filled 2016-11-03 (×3): qty 1

## 2016-11-03 MED ORDER — SIMVASTATIN 10 MG PO TABS
40.0000 mg | ORAL_TABLET | Freq: Every day | ORAL | Status: DC
  Administered 2016-11-03: 40 mg via ORAL
  Filled 2016-11-03: qty 4

## 2016-11-03 MED ORDER — LORATADINE 10 MG PO TABS
10.0000 mg | ORAL_TABLET | Freq: Every day | ORAL | Status: DC
Start: 2016-11-03 — End: 2016-11-04
  Administered 2016-11-04: 10 mg via ORAL
  Filled 2016-11-03: qty 1

## 2016-11-03 MED ORDER — ASPIRIN EC 81 MG PO TBEC
81.0000 mg | DELAYED_RELEASE_TABLET | Freq: Every day | ORAL | Status: DC
  Administered 2016-11-04: 81 mg via ORAL
  Filled 2016-11-03: qty 1

## 2016-11-03 MED ORDER — FUROSEMIDE 40 MG PO TABS
20.0000 mg | ORAL_TABLET | Freq: Every day | ORAL | Status: DC
  Administered 2016-11-04: 20 mg via ORAL
  Filled 2016-11-03: qty 1

## 2016-11-03 MED ORDER — BUPROPION HCL ER (SR) 150 MG PO TB12
ORAL_TABLET | ORAL | Status: AC
  Filled 2016-11-03: qty 1

## 2016-11-03 MED ORDER — BUPROPION HCL ER (SR) 150 MG PO TB12
150.0000 mg | ORAL_TABLET | Freq: Two times a day (BID) | ORAL | Status: DC
  Administered 2016-11-03 – 2016-11-04 (×2): 150 mg via ORAL
  Filled 2016-11-03 (×4): qty 1

## 2016-11-03 MED ORDER — MIRTAZAPINE 15 MG PO TBDP
ORAL_TABLET | ORAL | Status: AC
  Filled 2016-11-03: qty 1

## 2016-11-03 MED ORDER — PAROXETINE HCL 20 MG PO TABS
40.0000 mg | ORAL_TABLET | Freq: Every day | ORAL | Status: DC
  Administered 2016-11-04: 40 mg via ORAL
  Filled 2016-11-03 (×2): qty 2

## 2016-11-03 MED ORDER — LORAZEPAM 1 MG PO TABS: 1.0000 mg | ORAL_TABLET | Freq: Three times a day (TID) | ORAL | Status: DC | PRN

## 2016-11-03 MED ORDER — ONDANSETRON 4 MG PO TBDP: 4.0000 mg | ORAL_TABLET | Freq: Three times a day (TID) | ORAL | Status: DC | PRN

## 2016-11-03 MED ORDER — LEVOTHYROXINE SODIUM 50 MCG PO TABS
125.0000 ug | ORAL_TABLET | Freq: Every day | ORAL | Status: DC
  Administered 2016-11-04: 125 ug via ORAL
  Filled 2016-11-03: qty 3

## 2016-11-03 MED ORDER — LORAZEPAM 2 MG/ML IJ SOLN
1.0000 mg | Freq: Once | INTRAMUSCULAR | Status: AC
  Administered 2016-11-03: 1 mg via INTRAVENOUS
  Filled 2016-11-03: qty 1

## 2016-11-03 MED ORDER — NAPROXEN SODIUM 275 MG PO TABS
275.0000 mg | ORAL_TABLET | Freq: Every day | ORAL | Status: DC | PRN
  Filled 2016-11-03: qty 1

## 2016-11-03 MED ORDER — INSULIN LISPRO 100 UNIT/ML ~~LOC~~ SOLN: 18.0000 [IU] | Freq: Three times a day (TID) | SUBCUTANEOUS | Status: DC

## 2016-11-03 MED ORDER — PANTOPRAZOLE SODIUM 40 MG PO TBEC
40.0000 mg | DELAYED_RELEASE_TABLET | Freq: Every day | ORAL | Status: DC
  Administered 2016-11-04: 40 mg via ORAL
  Filled 2016-11-03: qty 1

## 2016-11-03 MED ORDER — INSULIN GLARGINE 100 UNIT/ML ~~LOC~~ SOLN
60.0000 [IU] | Freq: Every day | SUBCUTANEOUS | Status: DC
  Administered 2016-11-03: 60 [IU] via SUBCUTANEOUS
  Filled 2016-11-03 (×2): qty 0.6

## 2016-11-03 MED ORDER — AMLODIPINE BESYLATE 5 MG PO TABS
10.0000 mg | ORAL_TABLET | Freq: Every day | ORAL | Status: DC
  Administered 2016-11-04: 10 mg via ORAL
  Filled 2016-11-03: qty 2

## 2016-11-03 NOTE — ED Notes (Signed)
Pt allowed to use phone to call her son to take care of her cat.

## 2016-11-03 NOTE — Clinical Social Work Note (Signed)
Spoke with Ron, RN advised the pt will need to be referred to gero-psych placement and a copy of the IVC paperwork has been requested.   Lind Covert, MSW, Latanya Presser

## 2016-11-03 NOTE — ED Notes (Signed)
Pt yelling and cursing at staff.  

## 2016-11-03 NOTE — ED Triage Notes (Signed)
Here by EMS.  Pt states she took a bunch of paxil prior to coming to the ER.  States she was trying to kill herself.

## 2016-11-03 NOTE — ED Provider Notes (Signed)
[place] DEPT Provider Note   CSN: [id number] Arrival date & time: 11/03/16  1408     History   Chief Complaint Chief Complaint  Patient presents with  . Drug Overdose    HPI Claudia Keller is a 65 y.o. female.  The history is provided by the patient and the EMS personnel. The history is limited by the condition of the patient (agitation, psych disorder).  Drug Overdose     Pt was seen at 1425. Per EMS and pt: Pt states she "was trying to leave this world" by OD on paxil. Unknown amount. Pt yelling loudly at ED staff on arrival.   Past Medical History:  Diagnosis Date  . Arthritis   . COPD (chronic obstructive pulmonary disease) ([place])   . Diverticulosis   . Essential hypertension, benign   . Hx of colonic polyps   . Hypothyroidism   . IBS (irritable bowel syndrome)   . Irritable bowel syndrome   . Mixed hyperlipidemia   . PTSD (post-traumatic stress disorder)   . S/P colonoscopy Jan 2011   RMR: pancolonic diverticula, polypoid rectal mucosa with  prominent lymphoid aggregates, repeat in Jan 2016   . Shortness of breath dyspnea   . Stress incontinence, female   . Thyroid disease   . Tobacco use   . Type 2 diabetes mellitus [facility])     Patient Active Problem List   Diagnosis Date Noted  . Personal history of noncompliance with medical treatment, presenting hazards to health 08/17/2016  . Emesis 05/20/2016  . Uncontrolled diabetes mellitus with stage 2 chronic kidney disease ([place]) 09/08/2015  . Essential hypertension, benign 09/08/2015  . Primary hypothyroidism 09/08/2015  . Diverticulosis of colon without hemorrhage   . Nausea and vomiting 10/27/2011  . Chest pain 06/24/2011  . Hypercholesterolemia 06/24/2011  . Tobacco abuse 06/24/2011  . IRRITABLE BOWEL SYNDROME 11/12/2009  . DIARRHEA 11/12/2009  . OTHER SYMPTOMS INVOLVING DIGESTIVE SYSTEM OTHER 11/12/2009  . History of colonic polyps 11/12/2009  . CLOSED FRACTURE OF METATARSAL BONE 11/30/2007  .  HIGH BLOOD PRESSURE 11/30/2007    Past Surgical History:  Procedure Laterality Date  . Bilateral tubal ligation    . BREAST BIOPSY Left 10/16/2014   negative  . CHOLECYSTECTOMY  09/11/2012   Procedure: LAPAROSCOPIC CHOLECYSTECTOMY;  Surgeon: Jamesetta So, MD;  Location: [facility] ORS;  Service: General;  Laterality: N/A;  . COLONOSCOPY  11/17/2009   Dr. Gala Romney: normal rectum, pancolonic diverticula, polyps benign, no adenomas.   . COLONOSCOPY N/A 02/12/2015   Dr. Gala Romney: colonic diverticulosis, multiple colonic polyps (tubular adenomas), negative segmental biopsies. Surveillance in 2021  . ESOPHAGOGASTRODUODENOSCOPY  11/23/2011   Dr. Gala Romney: Erosive reflux esophagitis; small hiatal hernia; esophagus dilated empirically  . MALONEY DILATION  11/23/2011   Procedure: Venia Minks DILATION;  Surgeon: Daneil Dolin, MD;  Location: [facility] ENDO SUITE;  Service: Endoscopy;  Laterality: N/A;  . SKIN LESION EXCISION     on buttocks  . TUBAL LIGATION      OB History    Gravida Para Term Preterm AB Living   4 4 4     4    SAB TAB Ectopic Multiple Live Births                   Home Medications    Prior to Admission medications   Medication Sig Start Date End Date Taking? Authorizing Provider  albuterol (PROVENTIL HFA;VENTOLIN HFA) 108 (90 BASE) MCG/ACT inhaler Inhale 1-2 puffs into the lungs every 6 (six) hours  as needed for wheezing or shortness of breath.    Yes Historical Provider, MD  amLODipine (NORVASC) 10 MG tablet Take 10 mg by mouth daily.    Yes Historical Provider, MD  aspirin EC 81 MG tablet Take 81 mg by mouth daily.   Yes Historical Provider, MD  buPROPion (WELLBUTRIN SR) 150 MG 12 hr tablet Take 150 mg by mouth 2 (two) times daily.   Yes Historical Provider, MD  cetirizine (ZYRTEC) 10 MG tablet Take 10 mg by mouth daily.    Yes Historical Provider, MD  furosemide (LASIX) 20 MG tablet Take 20 mg by mouth daily.    Yes Historical Provider, MD  gabapentin (NEURONTIN) 100 MG capsule Take 100 mg by  mouth 3 (three) times daily.    Yes Historical Provider, MD  insulin glargine (LANTUS) 100 UNIT/ML injection Inject 60 Units into the skin at bedtime.   Yes Historical Provider, MD  insulin lispro (HUMALOG) 100 UNIT/ML injection Inject 18-24 Units into the skin 3 (three) times daily before meals. Pt uses per sliding scale.   Yes Historical Provider, MD  levothyroxine (SYNTHROID, LEVOTHROID) 125 MCG tablet Take 125 mcg by mouth daily before breakfast.   Yes Historical Provider, MD  linagliptin (TRADJENTA) 5 MG TABS tablet Take 5 mg by mouth daily.    Yes Historical Provider, MD  mirtazapine (REMERON SOL-TAB) 15 MG disintegrating tablet Take 15 mg by mouth at bedtime.   Yes Historical Provider, MD  naproxen sodium (ALEVE) 220 MG tablet Take 220-440 mg by mouth daily as needed (for pain).   Yes Historical Provider, MD  ondansetron (ZOFRAN ODT) 4 MG disintegrating tablet Take 1 tablet (4 mg total) by mouth every 8 (eight) hours as needed for nausea or vomiting. 06/10/16  Yes Annitta Needs, NP  pantoprazole (PROTONIX) 40 MG tablet Take 40 mg by mouth daily before breakfast.   Yes Historical Provider, MD  PARoxetine (PAXIL) 40 MG tablet Take 40 mg by mouth daily.    Yes Historical Provider, MD  phenazopyridine (PYRIDIUM) 95 MG tablet Take 95 mg by mouth 2 (two) times daily.   Yes Historical Provider, MD  simvastatin (ZOCOR) 40 MG tablet Take 40 mg by mouth at bedtime.    Yes Historical Provider, MD    Family History Family History  Problem Relation Age of Onset  . Schizophrenia Brother   . Stroke Brother   . Cancer Mother     unsure what type  . Heart disease Father   . Heart attack Father   . Coronary artery disease    . Diabetes type II    . Cancer Son     bladder  . Colon cancer Neg Hx     Social History Social History  Substance Use Topics  . Smoking status: Current Every Day Smoker    Packs/day: 1.00    Years: 43.00    Types: Cigarettes  . Smokeless tobacco: Never Used  . Alcohol  use No     Allergies   Patient has no known allergies.   Review of Systems Review of Systems  Unable to perform ROS: Psychiatric disorder     Physical Exam Updated Vital Signs BP 151/72   Pulse 96   Temp 98.3 F (36.8 C) (Oral)   Resp 18   Ht 5\' 5"  (1.651 m)   Wt 174 lb (78.9 kg)   SpO2 96%   BMI 28.96 kg/m   Physical Exam 1430: Physical examination:  Nursing notes reviewed; Vital signs and O2  SAT reviewed;  Constitutional: Well developed, Well nourished, Well hydrated, In no acute distress; Head:  Normocephalic, atraumatic; Eyes: EOMI, PERRL, No scleral icterus; ENMT: Mouth and pharynx normal, Mucous membranes moist; Neck: Supple, Full range of motion; Cardiovascular: Regular rate and rhythm; Respiratory: Breath sounds clear, No wheezes.  Speaking full sentences with ease, Normal respiratory effort/excursion; Chest: No deformity, Movement normal; Abdomen: Nondistended; Extremities: No deformity.; Neuro: Awake, alert. Major CN grossly intact.  Speech clear. No gross focal motor deficits in extremities. Climbs on and off stretcher easily by herself. Gait steady.; Skin: Color normal, Warm, Dry.; Psych:  Agitated, screaming at ED staff. Tangential, rambling.    ED Treatments / Results  Labs (all labs ordered are listed, but only abnormal results are displayed)   EKG  EKG Interpretation  Date/Time:  Wednesday November 03 2016 14:19:13 EST Ventricular Rate:  99 PR Interval:    QRS Duration: 77 QT Interval:  369 QTC Calculation: 474 R Axis:   73 Text Interpretation:  Sinus rhythm Low voltage, precordial leads Baseline wander When compared with ECG of 05/11/2016 QT has shortened Confirmed by [facility]  MD, Nunzio Cory [id number]) on 11/03/2016 2:44:11 PM       Radiology   Procedures Procedures (including critical care time)  Medications Ordered in ED Medications  LORazepam (ATIVAN) injection 1 mg (1 mg Intravenous Given 11/03/16 1443)     Initial Impression / Assessment  and Plan / ED Course  I have reviewed the triage vital signs and the nursing notes.  Pertinent labs & imaging results that were available during my care of the patient were reviewed by me and considered in my medical decision making (see chart for details).  MDM Reviewed: previous chart, nursing note and vitals Reviewed previous: labs and ECG Interpretation: labs and ECG   Results for orders placed or performed during the hospital encounter of 11/03/16  Acetaminophen level  Result Value Ref Range   Acetaminophen (Tylenol), Serum <10 (L) 10 - 30 ug/mL  Comprehensive metabolic panel  Result Value Ref Range   Sodium 135 135 - 145 mmol/L   Potassium 3.9 3.5 - 5.1 mmol/L   Chloride 103 101 - 111 mmol/L   CO2 24 22 - 32 mmol/L   Glucose, Bld 239 (H) 65 - 99 mg/dL   BUN 16 6 - 20 mg/dL   Creatinine, Ser 1.00 0.44 - 1.00 mg/dL   Calcium 9.3 8.9 - 10.3 mg/dL   Total Protein 7.0 6.5 - 8.1 g/dL   Albumin 4.0 3.5 - 5.0 g/dL   AST 25 15 - 41 U/L   ALT 19 14 - 54 U/L   Alkaline Phosphatase 128 (H) 38 - 126 U/L   Total Bilirubin 0.5 0.3 - 1.2 mg/dL   GFR calc non Af Amer 58 (L) >60 mL/min   GFR calc Af Amer >60 >60 mL/min   Anion gap 8 5 - 15  Ethanol  Result Value Ref Range   Alcohol, Ethyl (B) <5 <5 mg/dL  Salicylate level  Result Value Ref Range   Salicylate Lvl Q000111Q 2.8 - 30.0 mg/dL  CBC with Differential  Result Value Ref Range   WBC 9.8 4.0 - 10.5 K/uL   RBC 4.65 3.87 - 5.11 MIL/uL   Hemoglobin 14.1 12.0 - 15.0 g/dL   HCT 42.4 36.0 - 46.0 %   MCV 91.2 78.0 - 100.0 fL   MCH 30.3 26.0 - 34.0 pg   MCHC 33.3 30.0 - 36.0 g/dL   RDW 15.2 11.5 - 15.5 %  Platelets 266 150 - 400 K/uL   Neutrophils Relative % 76 %   Neutro Abs 7.6 1.7 - 7.7 K/uL   Lymphocytes Relative 17 %   Lymphs Abs 1.7 0.7 - 4.0 K/uL   Monocytes Relative 6 %   Monocytes Absolute 0.5 0.1 - 1.0 K/uL   Eosinophils Relative 1 %   Eosinophils Absolute 0.1 0.0 - 0.7 K/uL   Basophils Relative 0 %   Basophils  Absolute 0.0 0.0 - 0.1 K/uL  Protime-INR  Result Value Ref Range   Prothrombin Time 12.4 11.4 - 15.2 seconds   INR 0.92   Acetaminophen level  Result Value Ref Range   Acetaminophen (Tylenol), Serum <10 (L) 10 - 30 ug/mL  Salicylate level  Result Value Ref Range   Salicylate Lvl Q000111Q 2.8 - 30.0 mg/dL    1800:  IM ativan given to pt on arrival. Pt was calmer and more cooperative, but is now yelling and cursing out in the hallway. IVC paperwork completed. TTS to evaluate.  2100:  TTS has evaluated pt: recommends inpt treatment, placement pending. Holding orders written.     Final Clinical Impressions(s) / ED Diagnoses   Final diagnoses:  None    New Prescriptions New Prescriptions   No medications on file      Francine Graven, DO 11/03/16 2125

## 2016-11-03 NOTE — ED Notes (Signed)
Pt is calm and cooperative at this time. Requesting to speak with her son. Pt son has left and is no longer in the lobby.

## 2016-11-03 NOTE — ED Notes (Signed)
TTS in progress now.  

## 2016-11-03 NOTE — ED Notes (Signed)
Pt speaking to son on telephone.
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
## 2016-11-03 NOTE — Clinical Social Work Note (Signed)
Pt has been referred to the following inpt facilities for treatment: [place], [place], [place], [place], [place], [place], [place]  Lind Covert, MSW, LCSWA

## 2016-11-03 NOTE — ED Notes (Signed)
Pt provided drink/snack at this time. 

## 2016-11-03 NOTE — ED Notes (Signed)
RN called [place], spoke with Roshonna, for unknown amount of paxil ingestion.   Recommendations- -observe for 6-8 hours for CMS depression, N/V, sinus tachycardia (treat with   IV fluids) and tremors -obtain EKG -4 hour Acetaminophen level and CMP

## 2016-11-03 NOTE — ED Notes (Signed)
Pt attempting to leave department. Yelling and cursing in hallway. EDP notified and filling out IVC paperwork.

## 2016-11-04 ENCOUNTER — Emergency Department (HOSPITAL_COMMUNITY): Payer: Medicaid Other

## 2016-11-04 ENCOUNTER — Encounter (HOSPITAL_COMMUNITY): Payer: Self-pay | Admitting: Emergency Medicine

## 2016-11-04 LAB — CBG MONITORING, ED
Glucose-Capillary: 129 mg/dL — ABNORMAL HIGH (ref 65–99)
Glucose-Capillary: 230 mg/dL — ABNORMAL HIGH (ref 65–99)

## 2016-11-04 LAB — URINALYSIS, ROUTINE W REFLEX MICROSCOPIC
Bilirubin Urine: NEGATIVE
Glucose, UA: >=500 mg/dL — AB
Hgb urine dipstick: NEGATIVE
Ketones, ur: NEGATIVE mg/dL
Nitrite: POSITIVE — AB
Protein, ur: NEGATIVE mg/dL
Specific Gravity, Urine: 1.017 (ref 1.005–1.030)
pH: 5 (ref 5.0–8.0)

## 2016-11-04 LAB — TSH: TSH: 15.337 u[IU]/mL — ABNORMAL HIGH (ref 0.350–4.500)

## 2016-11-04 IMAGING — DX DG CHEST 2V
2 series · 2 of 2 positions shown · non-contrast
Comparison: [DATE]

CLINICAL DATA: Overdose.

EXAM:
CHEST  2 VIEW

[chest pa]
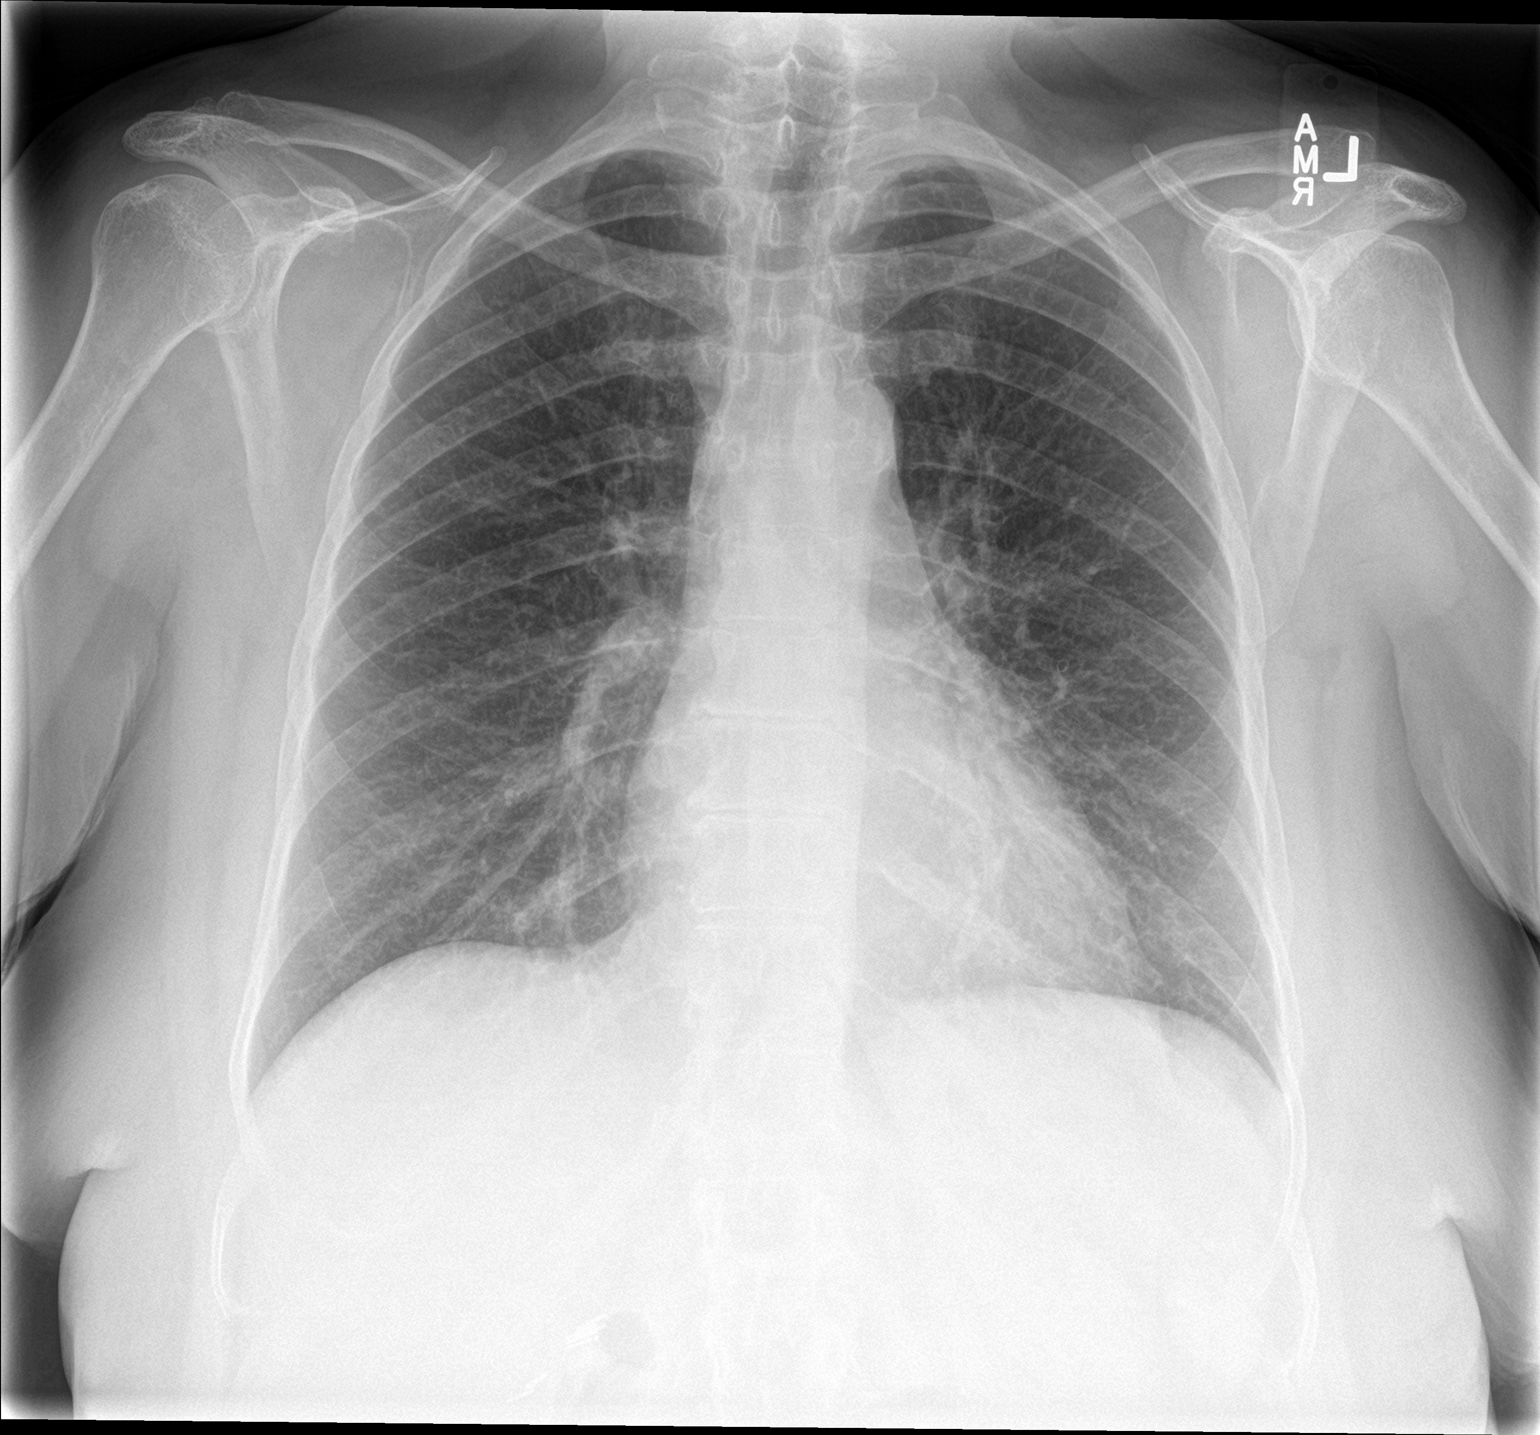

[chest lat]
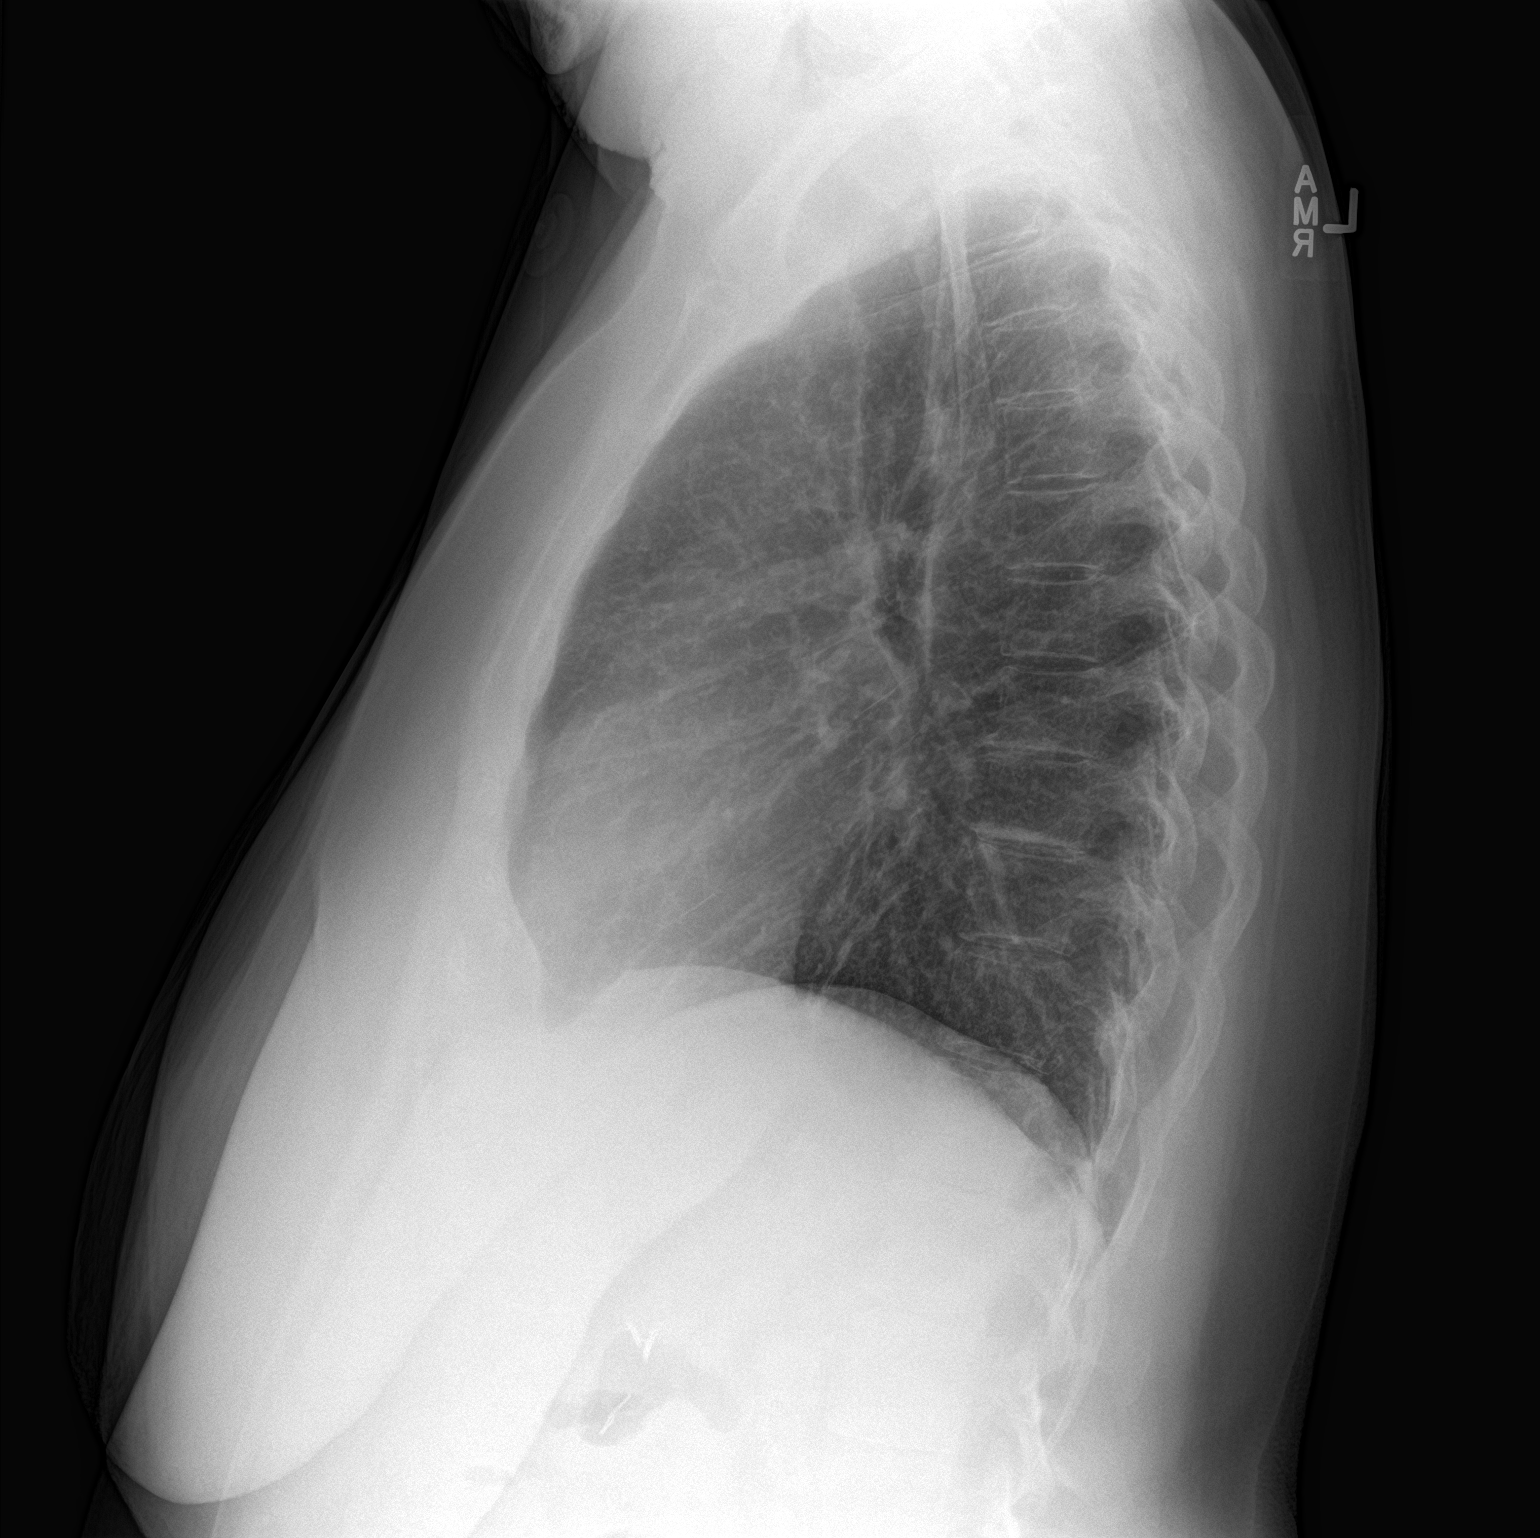

[2 of 2 positions shown; findings below may reference images not displayed]

FINDINGS: Heart and mediastinal contours are within normal limits. No focal
opacities or effusions. No acute bony abnormality.
IMPRESSION: No active cardiopulmonary disease.

## 2016-11-04 MED ORDER — INSULIN ASPART 100 UNIT/ML ~~LOC~~ SOLN: 0.0000 [IU] | Freq: Every day | SUBCUTANEOUS | Status: DC

## 2016-11-04 MED ORDER — NAPROXEN 250 MG PO TABS: 250.0000 mg | ORAL_TABLET | Freq: Every day | ORAL | Status: DC | PRN

## 2016-11-04 MED ORDER — INSULIN ASPART 100 UNIT/ML ~~LOC~~ SOLN
0.0000 [IU] | Freq: Three times a day (TID) | SUBCUTANEOUS | Status: DC
  Administered 2016-11-04: 5 [IU] via SUBCUTANEOUS
  Filled 2016-11-04: qty 1

## 2016-11-04 NOTE — Progress Notes (Signed)
Inpatient Diabetes Program Recommendations  AACE/ADA: New Consensus Statement on Inpatient Glycemic Control (2015)  Target Ranges:  Prepandial:   less than 140 mg/dL      Peak postprandial:   less than 180 mg/dL (1-2 hours)      Critically ill patients:  140 - 180 mg/dL   Results for BLAYZE, KOBER (MRN [id number]) as of 11/04/2016 07:46  Ref. Range 11/04/2016 07:27  Glucose-Capillary Latest Ref Range: 65 - 99 mg/dL 129 (H)  Results for PETAL, GENIER (MRN [id number]) as of 11/04/2016 07:46  Ref. Range 11/03/2016 14:50  Glucose Latest Ref Range: 65 - 99 mg/dL 239 (H)   Review of Glycemic Control  Diabetes history: DM2 Outpatient Diabetes medications: Lantus 60 units QHS, Humalog 18-24 units TID with meals, Tradjenta 5 mg daily Current orders for Inpatient glycemic control: Lantus 60 units QHS, Humalog 18-24 units TID with meals, Tradjenta 5 mg daily  Inpatient Diabetes Program Recommendations: Correction (SSI): While holding in the ED, please discontinue Humalog 18-24 units TID (no parameters to determine dose and Humalog not on formulary) and use Glycemic Control order set to order Novolog 0-15 units TID with meals and Novolog 0-5 units QHS.  Thanks, Barnie Alderman, RN, MSN, CDE Diabetes Coordinator Inpatient Diabetes Program [phone] (Team Pager from 8am to 5pm)

## 2016-11-04 NOTE — ED Notes (Signed)
Amy from [place] just called due to pt not arriving to unit yet.  [place] [facility] to see status of pt and she left appx 45 minutes ago with deputy to go to [place].  Amy at [facility] called back to inform her of findings.

## 2016-11-04 NOTE — Progress Notes (Addendum)
Faxed TSH level and chest x ray results to [facility]. Awaiting intake to review and call back.  11:00- pt accepted to [facility] geriatric unit by Dr. Lenna Sciara. Alonna Minium. Per Colleen in intake, bed will be available 15:00 today. Asks that report be called to [phone] close to time of transport.  Notified [facility].   Sharren Bridge, MSW, LCSW Clinical Social Work, Disposition  11/04/2016 [phone]

## 2016-11-04 NOTE — Progress Notes (Signed)
Colleen at [facility] states pt has been approved for admission pending receiving TSH level and chest x-ray. Notified [facility]. Will send to [facility] once resulted.  Sharren Bridge, MSW, LCSW Clinical Social Work, Disposition  11/04/2016 [phone]

## 2016-11-04 NOTE — ED Notes (Signed)
Notified coleen at [facility] that pt is en route to facility with police.

## 2016-11-04 NOTE — ED Notes (Signed)
First cbg taken by nurse tech did not cross over into computer.  After eating another cbg was taken and read 230. Verified with Dr Lacinda Axon to give 5 units of insulin based off sliding scale and cbg of 230.

## 2016-11-04 NOTE — ED Notes (Addendum)
Pt given meal.  IVC paperwork faxed by Joycelyn Schmid, Network engineer to [facility].  Papers were served to hospital at 0700 this morning.

## 2016-11-04 NOTE — ED Notes (Signed)
Spoke to lorrie with pharmacy and will call Dr. Lacinda Axon regarding insulin dosage.

## 2016-11-04 NOTE — ED Notes (Signed)
Megan from [facility] called to report that pt was accepted at [facility] behavioral and can be transported after 3pm today.   Accepting doctor: Subedi Report number [phone]

## 2016-11-04 NOTE — ED Notes (Signed)
Pt son at bedside at this time.  Pt gave permission to give her son keys to house to take care of her cats.   Pt son given keys.

## 2016-11-04 NOTE — ED Notes (Signed)
Pt given meal

## 2016-11-05 DIAGNOSIS — E785 Hyperlipidemia, unspecified: Secondary | ICD-10-CM | POA: Insufficient documentation

## 2016-11-05 DIAGNOSIS — F319 Bipolar disorder, unspecified: Secondary | ICD-10-CM | POA: Insufficient documentation

## 2016-11-05 LAB — CBG MONITORING, ED: Glucose-Capillary: 171 mg/dL — ABNORMAL HIGH (ref 65–99)

## 2016-11-06 DIAGNOSIS — J309 Allergic rhinitis, unspecified: Secondary | ICD-10-CM | POA: Insufficient documentation

## 2016-11-08 LAB — URINE CULTURE: Culture: >=100000 — AB

## 2016-11-09 ENCOUNTER — Telehealth (HOSPITAL_BASED_OUTPATIENT_CLINIC_OR_DEPARTMENT_OTHER): Payer: Self-pay | Admitting: Emergency Medicine

## 2016-11-09 NOTE — Progress Notes (Signed)
ED Antimicrobial Stewardship Positive Culture Follow Up   Claudia Keller is an 66 y.o. female who presented to [facility] on 11/03/2016 with a chief complaint of  Chief Complaint  Patient presents with  . Drug Overdose    Recent Results (from the past 720 hour(s))  Urine culture     Status: Abnormal   Collection Time: 11/04/16  2:18 AM  Result Value Ref Range Status   Specimen Description URINE, RANDOM  Final   Special Requests NONE  Final   Culture >=100,000 COLONIES/mL ESCHERICHIA COLI (A)  Final   Report Status 11/08/2016 FINAL  Final   Organism ID, Bacteria ESCHERICHIA COLI (A)  Final      Susceptibility   Escherichia coli - MIC*    AMPICILLIN >=32 RESISTANT Resistant     CEFAZOLIN <=4 SENSITIVE Sensitive     CEFTRIAXONE <=1 SENSITIVE Sensitive     CIPROFLOXACIN 1 SENSITIVE Sensitive     GENTAMICIN <=1 SENSITIVE Sensitive     IMIPENEM <=0.25 SENSITIVE Sensitive     NITROFURANTOIN <=16 SENSITIVE Sensitive     TRIMETH/SULFA >=320 RESISTANT Resistant     AMPICILLIN/SULBACTAM 4 SENSITIVE Sensitive     PIP/TAZO <=4 SENSITIVE Sensitive     Extended ESBL NEGATIVE Sensitive     * >=100,000 COLONIES/mL ESCHERICHIA COLI    [x]  Patient discharged originally without antimicrobial agent and treatment is now indicated  New antibiotic prescription: Cephalexin 500 mg BID x 7 days.  ED Provider: Hervey Ard, PA-C  Dimitri Ped, PharmD, BCPS PGY-2 Infectious Diseases Pharmacy Resident Pager: [phone] 11/09/2016, 11:19 AM

## 2016-11-09 NOTE — Telephone Encounter (Signed)
Post ED Visit - Positive Culture Follow-up: Successful Patient Follow-Up  Culture assessed and recommendations reviewed by: []  Elenor Quinones, Pharm.D. [x]  Heide Guile, Pharm.D., BCPS []  Parks Neptune, Pharm.D. []  Alycia Rossetti, Pharm.D., BCPS []  [place], Pharm.D., BCPS, AAHIVP []  Legrand Como, Pharm.D., BCPS, AAHIVP []  Cassie Stewart, Pharm.D. []  Stephens November, Pharm.D.  Positive urine culture  [x]  Patient discharged without antimicrobial prescription and treatment is now indicated []  Organism is resistant to prescribed ED discharge antimicrobial []  Patient with positive blood cultures  Changes discussed with ED provider: Leonia Corona PA New antibiotic prescription start cephalexin 500mg  po bid x 7 days  Attempting to contact patient   Hazle Nordmann 11/09/2016, 3:54 PM

## 2016-11-17 ENCOUNTER — Telehealth (HOSPITAL_BASED_OUTPATIENT_CLINIC_OR_DEPARTMENT_OTHER): Payer: Self-pay | Admitting: *Deleted

## 2016-11-17 NOTE — Telephone Encounter (Signed)
Post ED Visit - Positive Culture Follow-up: Successful Patient Follow-Up  Culture assessed and recommendations reviewed by: []  Elenor Quinones, Pharm.D. []  Heide Guile, Pharm.D., BCPS []  Parks Neptune, Pharm.D. []  Alycia Rossetti, Pharm.D., BCPS []  [place], [place].D., BCPS, AAHIVP []  Legrand Como, Pharm.D., BCPS, AAHIVP []  Milus Glazier, Pharm.D. []  Stephens November, [place].D. Hervey Ard, PA-C  Positive urine culture  [x]  Patient discharged without antimicrobial prescription and treatment is now indicated []  Organism is resistant to prescribed ED discharge antimicrobial []  Patient with positive blood cultures  Changes discussed with ED provider: Hervey Ard, PA C New antibiotic prescription Cephalexin 500mg  PO BID x 7 days. Called to [place]  Contacted patient, date01/10/201, t  Ardeen Fillers 11/17/2016, 12:10 PM

## 2016-12-08 ENCOUNTER — Ambulatory Visit: Payer: Medicaid Other | Admitting: "Endocrinology

## 2017-02-16 ENCOUNTER — Emergency Department (HOSPITAL_COMMUNITY)
Admission: EM | Admit: 2017-02-16 | Discharge: 2017-02-17 | Disposition: A | Discharge status: Other Acute Inpatient Hospital | Payer: Medicaid Other | Attending: Emergency Medicine | Admitting: Emergency Medicine

## 2017-02-16 ENCOUNTER — Encounter (HOSPITAL_COMMUNITY): Payer: Self-pay | Admitting: Emergency Medicine

## 2017-02-16 DIAGNOSIS — I129 Hypertensive chronic kidney disease with stage 1 through stage 4 chronic kidney disease, or unspecified chronic kidney disease: Secondary | ICD-10-CM | POA: Insufficient documentation

## 2017-02-16 DIAGNOSIS — N182 Chronic kidney disease, stage 2 (mild): Secondary | ICD-10-CM | POA: Diagnosis not present

## 2017-02-16 DIAGNOSIS — Z79899 Other long term (current) drug therapy: Secondary | ICD-10-CM | POA: Insufficient documentation

## 2017-02-16 DIAGNOSIS — Z7982 Long term (current) use of aspirin: Secondary | ICD-10-CM | POA: Insufficient documentation

## 2017-02-16 DIAGNOSIS — Z794 Long term (current) use of insulin: Secondary | ICD-10-CM | POA: Insufficient documentation

## 2017-02-16 DIAGNOSIS — E039 Hypothyroidism, unspecified: Secondary | ICD-10-CM | POA: Insufficient documentation

## 2017-02-16 DIAGNOSIS — F17210 Nicotine dependence, cigarettes, uncomplicated: Secondary | ICD-10-CM | POA: Insufficient documentation

## 2017-02-16 DIAGNOSIS — E1122 Type 2 diabetes mellitus with diabetic chronic kidney disease: Secondary | ICD-10-CM | POA: Insufficient documentation

## 2017-02-16 DIAGNOSIS — F329 Major depressive disorder, single episode, unspecified: Secondary | ICD-10-CM | POA: Diagnosis not present

## 2017-02-16 DIAGNOSIS — R45851 Suicidal ideations: Secondary | ICD-10-CM

## 2017-02-16 DIAGNOSIS — F32A Depression, unspecified: Secondary | ICD-10-CM

## 2017-02-16 DIAGNOSIS — J449 Chronic obstructive pulmonary disease, unspecified: Secondary | ICD-10-CM | POA: Diagnosis not present

## 2017-02-16 LAB — COMPREHENSIVE METABOLIC PANEL
ALT: 26 U/L (ref 14–54)
AST: 22 U/L (ref 15–41)
Albumin: 3.7 g/dL (ref 3.5–5.0)
Alkaline Phosphatase: 118 U/L (ref 38–126)
Anion gap: 8 (ref 5–15)
BUN: 32 mg/dL — ABNORMAL HIGH (ref 6–20)
CO2: 28 mmol/L (ref 22–32)
Calcium: 9.4 mg/dL (ref 8.9–10.3)
Chloride: 105 mmol/L (ref 101–111)
Creatinine, Ser: 1.01 mg/dL — ABNORMAL HIGH (ref 0.44–1.00)
GFR calc Af Amer: >60 mL/min (ref >60)
GFR calc non Af Amer: 58 mL/min — ABNORMAL LOW (ref >60)
Glucose, Bld: 89 mg/dL (ref 65–99)
Potassium: 4.1 mmol/L (ref 3.5–5.1)
Sodium: 141 mmol/L (ref 135–145)
Total Bilirubin: 0.4 mg/dL (ref 0.3–1.2)
Total Protein: 6.6 g/dL (ref 6.5–8.1)

## 2017-02-16 LAB — ACETAMINOPHEN LEVEL: Acetaminophen (Tylenol), Serum: <10 ug/mL — ABNORMAL LOW (ref 10–30)

## 2017-02-16 LAB — CBC
HCT: 42.8 % (ref 36.0–46.0)
Hemoglobin: 14.3 g/dL (ref 12.0–15.0)
MCH: 30.2 pg (ref 26.0–34.0)
MCHC: 33.4 g/dL (ref 30.0–36.0)
MCV: 90.5 fL (ref 78.0–100.0)
Platelets: 179 10*3/uL (ref 150–400)
RBC: 4.73 MIL/uL (ref 3.87–5.11)
RDW: 13.7 % (ref 11.5–15.5)
WBC: 9.1 10*3/uL (ref 4.0–10.5)

## 2017-02-16 LAB — CBG MONITORING, ED: Glucose-Capillary: 262 mg/dL — ABNORMAL HIGH (ref 65–99)

## 2017-02-16 LAB — RAPID URINE DRUG SCREEN, HOSP PERFORMED
Amphetamines: NOT DETECTED
Barbiturates: NOT DETECTED
Benzodiazepines: NOT DETECTED
Cocaine: NOT DETECTED
Opiates: NOT DETECTED
Tetrahydrocannabinol: NOT DETECTED

## 2017-02-16 LAB — SALICYLATE LEVEL: Salicylate Lvl: <7 mg/dL (ref 2.8–30.0)

## 2017-02-16 LAB — ETHANOL: Alcohol, Ethyl (B): <5 mg/dL (ref <5)

## 2017-02-16 MED ORDER — ONDANSETRON 4 MG PO TBDP: 4.0000 mg | ORAL_TABLET | Freq: Three times a day (TID) | ORAL | Status: DC | PRN

## 2017-02-16 MED ORDER — BUPROPION HCL ER (SR) 150 MG PO TB12
150.0000 mg | ORAL_TABLET | Freq: Two times a day (BID) | ORAL | Status: DC
  Administered 2017-02-16 – 2017-02-17 (×2): 150 mg via ORAL
  Filled 2017-02-16 (×4): qty 1

## 2017-02-16 MED ORDER — DIVALPROEX SODIUM 250 MG PO DR TAB
250.0000 mg | DELAYED_RELEASE_TABLET | Freq: Three times a day (TID) | ORAL | Status: DC
  Administered 2017-02-16 – 2017-02-17 (×3): 250 mg via ORAL
  Filled 2017-02-16 (×3): qty 1

## 2017-02-16 MED ORDER — LEVOTHYROXINE SODIUM 50 MCG PO TABS
125.0000 ug | ORAL_TABLET | Freq: Every day | ORAL | Status: DC
  Administered 2017-02-17: 125 ug via ORAL
  Filled 2017-02-16: qty 3

## 2017-02-16 MED ORDER — PAROXETINE HCL 20 MG PO TABS
40.0000 mg | ORAL_TABLET | Freq: Every day | ORAL | Status: DC
  Administered 2017-02-17: 40 mg via ORAL
  Filled 2017-02-16 (×2): qty 2

## 2017-02-16 MED ORDER — MIRTAZAPINE 15 MG PO TABS
15.0000 mg | ORAL_TABLET | Freq: Every day | ORAL | Status: DC
  Administered 2017-02-16: 15 mg via ORAL
  Filled 2017-02-16 (×2): qty 1

## 2017-02-16 MED ORDER — AMLODIPINE BESYLATE 5 MG PO TABS
10.0000 mg | ORAL_TABLET | Freq: Every day | ORAL | Status: DC
  Administered 2017-02-17: 10 mg via ORAL
  Filled 2017-02-16: qty 2

## 2017-02-16 MED ORDER — ALBUTEROL SULFATE HFA 108 (90 BASE) MCG/ACT IN AERS
1.0000 | INHALATION_SPRAY | Freq: Four times a day (QID) | RESPIRATORY_TRACT | Status: DC | PRN
Start: 2017-02-16 — End: 2017-02-17

## 2017-02-16 MED ORDER — NAPROXEN 250 MG PO TABS: 250.0000 mg | ORAL_TABLET | Freq: Every day | ORAL | Status: DC | PRN

## 2017-02-16 MED ORDER — FUROSEMIDE 40 MG PO TABS
20.0000 mg | ORAL_TABLET | Freq: Every day | ORAL | Status: DC
  Administered 2017-02-17: 20 mg via ORAL
  Filled 2017-02-16: qty 1

## 2017-02-16 MED ORDER — LINAGLIPTIN 5 MG PO TABS
5.0000 mg | ORAL_TABLET | Freq: Every day | ORAL | Status: DC
  Administered 2017-02-17: 5 mg via ORAL
  Filled 2017-02-16 (×2): qty 1

## 2017-02-16 MED ORDER — PANTOPRAZOLE SODIUM 40 MG PO TBEC
40.0000 mg | DELAYED_RELEASE_TABLET | Freq: Every day | ORAL | Status: DC
  Administered 2017-02-17: 40 mg via ORAL
  Filled 2017-02-16: qty 1

## 2017-02-16 MED ORDER — SIMVASTATIN 10 MG PO TABS
40.0000 mg | ORAL_TABLET | Freq: Every day | ORAL | Status: DC
  Administered 2017-02-16: 40 mg via ORAL
  Filled 2017-02-16: qty 4

## 2017-02-16 MED ORDER — LORATADINE 10 MG PO TABS
10.0000 mg | ORAL_TABLET | Freq: Every day | ORAL | Status: DC
  Administered 2017-02-17: 10 mg via ORAL
  Filled 2017-02-16: qty 1

## 2017-02-16 MED ORDER — GABAPENTIN 100 MG PO CAPS
100.0000 mg | ORAL_CAPSULE | Freq: Three times a day (TID) | ORAL | Status: DC
  Administered 2017-02-16 – 2017-02-17 (×3): 100 mg via ORAL
  Filled 2017-02-16 (×3): qty 1

## 2017-02-16 MED ORDER — ASPIRIN EC 81 MG PO TBEC
81.0000 mg | DELAYED_RELEASE_TABLET | Freq: Every day | ORAL | Status: DC
  Administered 2017-02-17: 81 mg via ORAL
  Filled 2017-02-16: qty 1

## 2017-02-16 MED ORDER — INSULIN ASPART 100 UNIT/ML ~~LOC~~ SOLN: 18.0000 [IU] | Freq: Three times a day (TID) | SUBCUTANEOUS | Status: DC

## 2017-02-16 MED ORDER — INSULIN GLARGINE 100 UNIT/ML ~~LOC~~ SOLN
60.0000 [IU] | Freq: Every day | SUBCUTANEOUS | Status: DC
  Administered 2017-02-16: 60 [IU] via SUBCUTANEOUS
  Filled 2017-02-16 (×2): qty 0.6

## 2017-02-16 MED ORDER — BUPROPION HCL ER (SR) 150 MG PO TB12
ORAL_TABLET | ORAL | Status: AC
  Filled 2017-02-16: qty 1

## 2017-02-16 NOTE — ED Notes (Signed)
Lab at bedside

## 2017-02-16 NOTE — ED Provider Notes (Signed)
[facility] DEPT Provider Note   CSN: [id number] Arrival date & time: 02/16/17  1637     History   Chief Complaint Chief Complaint  Patient presents with  . V70.1    HPI Claudia Keller is a 66 y.o. female.  HPI  Pt was seen at 1655. Per pt, c/o gradual onset and worsening of persistent SI that began today. Pt states she got into an argument with her son regarding her living situation "and he took the side of the people in my building." Pt states her "son doesn't want to take care of her" and "I should have killed myself when I was little." States she would "cut her wrists." Denies SA, no HI, no AVH.   Past Medical History:  Diagnosis Date  . Arthritis   . COPD (chronic obstructive pulmonary disease) ([place])   . Diverticulosis   . Essential hypertension, benign   . Hx of colonic polyps   . Hypothyroidism   . IBS (irritable bowel syndrome)   . Irritable bowel syndrome   . Mixed hyperlipidemia   . PTSD (post-traumatic stress disorder)   . S/P colonoscopy Jan 2011   RMR: pancolonic diverticula, polypoid rectal mucosa with  prominent lymphoid aggregates, repeat in Jan 2016   . Shortness of breath dyspnea   . Stress incontinence, female   . Thyroid disease   . Tobacco use   . Type 2 diabetes mellitus [facility])     Patient Active Problem List   Diagnosis Date Noted  . Personal history of noncompliance with medical treatment, presenting hazards to health 08/17/2016  . Emesis 05/20/2016  . Uncontrolled diabetes mellitus with stage 2 chronic kidney disease ([place]) 09/08/2015  . Essential hypertension, benign 09/08/2015  . Primary hypothyroidism 09/08/2015  . Diverticulosis of colon without hemorrhage   . Nausea and vomiting 10/27/2011  . Chest pain 06/24/2011  . Hypercholesterolemia 06/24/2011  . Tobacco abuse 06/24/2011  . IRRITABLE BOWEL SYNDROME 11/12/2009  . DIARRHEA 11/12/2009  . OTHER SYMPTOMS INVOLVING DIGESTIVE SYSTEM OTHER 11/12/2009  . History of colonic polyps  11/12/2009  . CLOSED FRACTURE OF METATARSAL BONE 11/30/2007  . HIGH BLOOD PRESSURE 11/30/2007    Past Surgical History:  Procedure Laterality Date  . Bilateral tubal ligation    . BREAST BIOPSY Left 10/16/2014   negative  . CHOLECYSTECTOMY  09/11/2012   Procedure: LAPAROSCOPIC CHOLECYSTECTOMY;  Surgeon: Jamesetta So, MD;  Location: [facility] ORS;  Service: General;  Laterality: N/A;  . COLONOSCOPY  11/17/2009   Dr. Gala Romney: normal rectum, pancolonic diverticula, polyps benign, no adenomas.   . COLONOSCOPY N/A 02/12/2015   Dr. Gala Romney: colonic diverticulosis, multiple colonic polyps (tubular adenomas), negative segmental biopsies. Surveillance in 2021  . ESOPHAGOGASTRODUODENOSCOPY  11/23/2011   Dr. Gala Romney: Erosive reflux esophagitis; small hiatal hernia; esophagus dilated empirically  . MALONEY DILATION  11/23/2011   Procedure: Venia Minks DILATION;  Surgeon: Daneil Dolin, MD;  Location: [facility] ENDO SUITE;  Service: Endoscopy;  Laterality: N/A;  . SKIN LESION EXCISION     on buttocks  . TUBAL LIGATION      OB History    Gravida Para Term Preterm AB Living   4 4 4     4    SAB TAB Ectopic Multiple Live Births                   Home Medications    Prior to Admission medications   Medication Sig Start Date End Date Taking? Authorizing Provider  albuterol (PROVENTIL HFA;VENTOLIN HFA) 108 (  90 BASE) MCG/ACT inhaler Inhale 1-2 puffs into the lungs every 6 (six) hours as needed for wheezing or shortness of breath.     Historical Provider, MD  amLODipine (NORVASC) 10 MG tablet Take 10 mg by mouth daily.     Historical Provider, MD  aspirin EC 81 MG tablet Take 81 mg by mouth daily.    Historical Provider, MD  buPROPion (WELLBUTRIN SR) 150 MG 12 hr tablet Take 150 mg by mouth 2 (two) times daily.    Historical Provider, MD  cetirizine (ZYRTEC) 10 MG tablet Take 10 mg by mouth daily.     Historical Provider, MD  furosemide (LASIX) 20 MG tablet Take 20 mg by mouth daily.     Historical Provider, MD    gabapentin (NEURONTIN) 100 MG capsule Take 100 mg by mouth 3 (three) times daily.     Historical Provider, MD  insulin glargine (LANTUS) 100 UNIT/ML injection Inject 60 Units into the skin at bedtime.    Historical Provider, MD  insulin lispro (HUMALOG) 100 UNIT/ML injection Inject 18-24 Units into the skin 3 (three) times daily before meals. Pt uses per sliding scale.    Historical Provider, MD  levothyroxine (SYNTHROID, LEVOTHROID) 125 MCG tablet Take 125 mcg by mouth daily before breakfast.    Historical Provider, MD  linagliptin (TRADJENTA) 5 MG TABS tablet Take 5 mg by mouth daily.     Historical Provider, MD  mirtazapine (REMERON SOL-TAB) 15 MG disintegrating tablet Take 15 mg by mouth at bedtime.    Historical Provider, MD  naproxen sodium (ALEVE) 220 MG tablet Take 220-440 mg by mouth daily as needed (for pain).    Historical Provider, MD  ondansetron (ZOFRAN ODT) 4 MG disintegrating tablet Take 1 tablet (4 mg total) by mouth every 8 (eight) hours as needed for nausea or vomiting. 06/10/16   Annitta Needs, NP  pantoprazole (PROTONIX) 40 MG tablet Take 40 mg by mouth daily before breakfast.    Historical Provider, MD  PARoxetine (PAXIL) 40 MG tablet Take 40 mg by mouth daily.     Historical Provider, MD  phenazopyridine (PYRIDIUM) 95 MG tablet Take 95 mg by mouth 2 (two) times daily.    Historical Provider, MD  simvastatin (ZOCOR) 40 MG tablet Take 40 mg by mouth at bedtime.     Historical Provider, MD    Family History Family History  Problem Relation Age of Onset  . Schizophrenia Brother   . Stroke Brother   . Cancer Mother     unsure what type  . Heart disease Father   . Heart attack Father   . Coronary artery disease    . Diabetes type II    . Cancer Son     bladder  . Colon cancer Neg Hx     Social History Social History  Substance Use Topics  . Smoking status: Current Every Day Smoker    Packs/day: 2.00    Years: 43.00    Types: Cigarettes  . Smokeless tobacco: Never  Used  . Alcohol use No     Allergies   Patient has no known allergies.   Review of Systems Review of Systems ROS: Statement: All systems negative except as marked or noted in the HPI; Constitutional: Negative for fever and chills. ; ; Eyes: Negative for eye pain, redness and discharge. ; ; ENMT: Negative for ear pain, hoarseness, nasal congestion, sinus pressure and sore throat. ; ; Cardiovascular: Negative for chest pain, palpitations, diaphoresis, dyspnea and peripheral edema. ; ;  Respiratory: Negative for cough, wheezing and stridor. ; ; Gastrointestinal: Negative for nausea, vomiting, diarrhea, abdominal pain, blood in stool, hematemesis, jaundice and rectal bleeding. . ; ; Genitourinary: Negative for dysuria, flank pain and hematuria. ; ; Musculoskeletal: Negative for back pain and neck pain. Negative for swelling and trauma.; ; Skin: Negative for pruritus, rash, abrasions, blisters, bruising and skin lesion.; ; Neuro: Negative for headache, lightheadedness and neck stiffness. Negative for weakness, altered level of consciousness, altered mental status, extremity weakness, paresthesias, involuntary movement, seizure and syncope.; Psych:  +SI, no SA, no HI, no hallucinations.     Physical Exam Updated Vital Signs Ht 5\' 5"  (1.651 m)   Wt 190 lb (86.2 kg)   BMI 31.62 kg/m   Physical Exam 1700: Physical examination:  Nursing notes reviewed; Vital signs and O2 SAT reviewed;  Constitutional: Well developed, Well nourished, Well hydrated, In no acute distress; Head:  Normocephalic, atraumatic; Eyes: EOMI, PERRL, No scleral icterus; ENMT: Mouth and pharynx normal, Mucous membranes moist; Neck: Supple, Full range of motion; Cardiovascular: Regular rate and rhythm; Respiratory: Breath sounds clear, No wheezes.  Speaking full sentences with ease, Normal respiratory effort/excursion; Chest: No deformity, Movement normal; Abdomen: Nondistended; Extremities: No deformity.; Neuro: AA&Ox3, Major CN  grossly intact.  Speech clear. No gross focal motor deficits in extremities. Climbs on and off stretcher easily by herself. Gait steady.; Skin: Color normal, Warm, Dry.; Psych:  Affect flat. Endorses SI.    ED Treatments / Results  Labs (all labs ordered are listed, but only abnormal results are displayed)   EKG  EKG Interpretation None       Radiology   Procedures Procedures (including critical care time)  Medications Ordered in ED Medications - No data to display   Initial Impression / Assessment and Plan / ED Course  I have reviewed the triage vital signs and the nursing notes.  Pertinent labs & imaging results that were available during my care of the patient were reviewed by me and considered in my medical decision making (see chart for details).  MDM Reviewed: previous chart, nursing note and vitals Reviewed previous: labs Interpretation: labs   Results for orders placed or performed during the hospital encounter of 02/16/17  Comprehensive metabolic panel  Result Value Ref Range   Sodium 141 135 - 145 mmol/L   Potassium 4.1 3.5 - 5.1 mmol/L   Chloride 105 101 - 111 mmol/L   CO2 28 22 - 32 mmol/L   Glucose, Bld 89 65 - 99 mg/dL   BUN 32 (H) 6 - 20 mg/dL   Creatinine, Ser 1.01 (H) 0.44 - 1.00 mg/dL   Calcium 9.4 8.9 - 10.3 mg/dL   Total Protein 6.6 6.5 - 8.1 g/dL   Albumin 3.7 3.5 - 5.0 g/dL   AST 22 15 - 41 U/L   ALT 26 14 - 54 U/L   Alkaline Phosphatase 118 38 - 126 U/L   Total Bilirubin 0.4 0.3 - 1.2 mg/dL   GFR calc non Af Amer 58 (L) >60 mL/min   GFR calc Af Amer >60 >60 mL/min   Anion gap 8 5 - 15  Ethanol  Result Value Ref Range   Alcohol, Ethyl (B) <5 <5 mg/dL  Salicylate level  Result Value Ref Range   Salicylate Lvl <9.3 2.8 - 30.0 mg/dL  Acetaminophen level  Result Value Ref Range   Acetaminophen (Tylenol), Serum <10 (L) 10 - 30 ug/mL  cbc  Result Value Ref Range   WBC 9.1 4.0 -  10.5 K/uL   RBC 4.73 3.87 - 5.11 MIL/uL   Hemoglobin  14.3 12.0 - 15.0 g/dL   HCT 42.8 36.0 - 46.0 %   MCV 90.5 78.0 - 100.0 fL   MCH 30.2 26.0 - 34.0 pg   MCHC 33.4 30.0 - 36.0 g/dL   RDW 13.7 11.5 - 15.5 %   Platelets 179 150 - 400 K/uL  Rapid urine drug screen (hospital performed)  Result Value Ref Range   Opiates NONE DETECTED NONE DETECTED   Cocaine NONE DETECTED NONE DETECTED   Benzodiazepines NONE DETECTED NONE DETECTED   Amphetamines NONE DETECTED NONE DETECTED   Tetrahydrocannabinol NONE DETECTED NONE DETECTED   Barbiturates NONE DETECTED NONE DETECTED    1755:  Will have TTS evaluate.   1940:  TTS has evaluated pt: recommends inpt treatment. Placement pending. Holding orders written.    Final Clinical Impressions(s) / ED Diagnoses   Final diagnoses:  Depression, unspecified depression type  Suicidal ideation    New Prescriptions New Prescriptions   No medications on file     Francine Graven, DO 02/16/17 2058

## 2017-02-16 NOTE — Progress Notes (Signed)
[facility] intake called CSW to say that they would accept her if pt could be IVC'd..  CSW called and left message for pt's nurse at [facility] and asked him to inititiate IVC request. CSW left information with TTS Specialist , Shawn, to follow up.    CSW contacted [facility] intake to inform of above and they requested an EKG, chest-xray and UA (to r/o UTI) prior to final decision on admission.  Claudia Keller. Judi Cong, MSW, [place] Work Disposition [phone]
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
## 2017-02-16 NOTE — Progress Notes (Signed)
Pt chart reviewed.  Patient was assessed by TTS and meets criteria for inpatient hospitalization. Patient referred out to the following for placement:   [facility], Ocie Doyne T. Judi Cong, MSW, [place] Work Disposition [phone]

## 2017-02-16 NOTE — ED Triage Notes (Signed)
Pt is tearful in triage, states her son does not want to take care of her and "I should have killed myself when I was little". States she would cut her wrists.

## 2017-02-16 NOTE — ED Notes (Signed)
ED Provider at bedside. 

## 2017-02-17 ENCOUNTER — Encounter (HOSPITAL_COMMUNITY): Payer: Self-pay | Admitting: Emergency Medicine

## 2017-02-17 ENCOUNTER — Emergency Department (HOSPITAL_COMMUNITY): Payer: Medicaid Other

## 2017-02-17 DIAGNOSIS — F322 Major depressive disorder, single episode, severe without psychotic features: Secondary | ICD-10-CM | POA: Insufficient documentation

## 2017-02-17 LAB — URINALYSIS, ROUTINE W REFLEX MICROSCOPIC
Bilirubin Urine: NEGATIVE
Glucose, UA: NEGATIVE mg/dL
Hgb urine dipstick: NEGATIVE
Ketones, ur: 5 mg/dL — AB
Nitrite: POSITIVE — AB
Protein, ur: NEGATIVE mg/dL
RBC / HPF: NONE SEEN RBC/hpf (ref 0–5)
Specific Gravity, Urine: 1.019 (ref 1.005–1.030)
pH: 5 (ref 5.0–8.0)

## 2017-02-17 LAB — CBG MONITORING, ED
Glucose-Capillary: 102 mg/dL — ABNORMAL HIGH (ref 65–99)
Glucose-Capillary: 127 mg/dL — ABNORMAL HIGH (ref 65–99)
Glucose-Capillary: 190 mg/dL — ABNORMAL HIGH (ref 65–99)

## 2017-02-17 IMAGING — DX DG CHEST 2V
2 series · 2 of 2 positions shown · non-contrast
Comparison: Radiographs [DATE].

CLINICAL DATA: Chronic cough.

EXAM:
CHEST  2 VIEW

[chest pa]
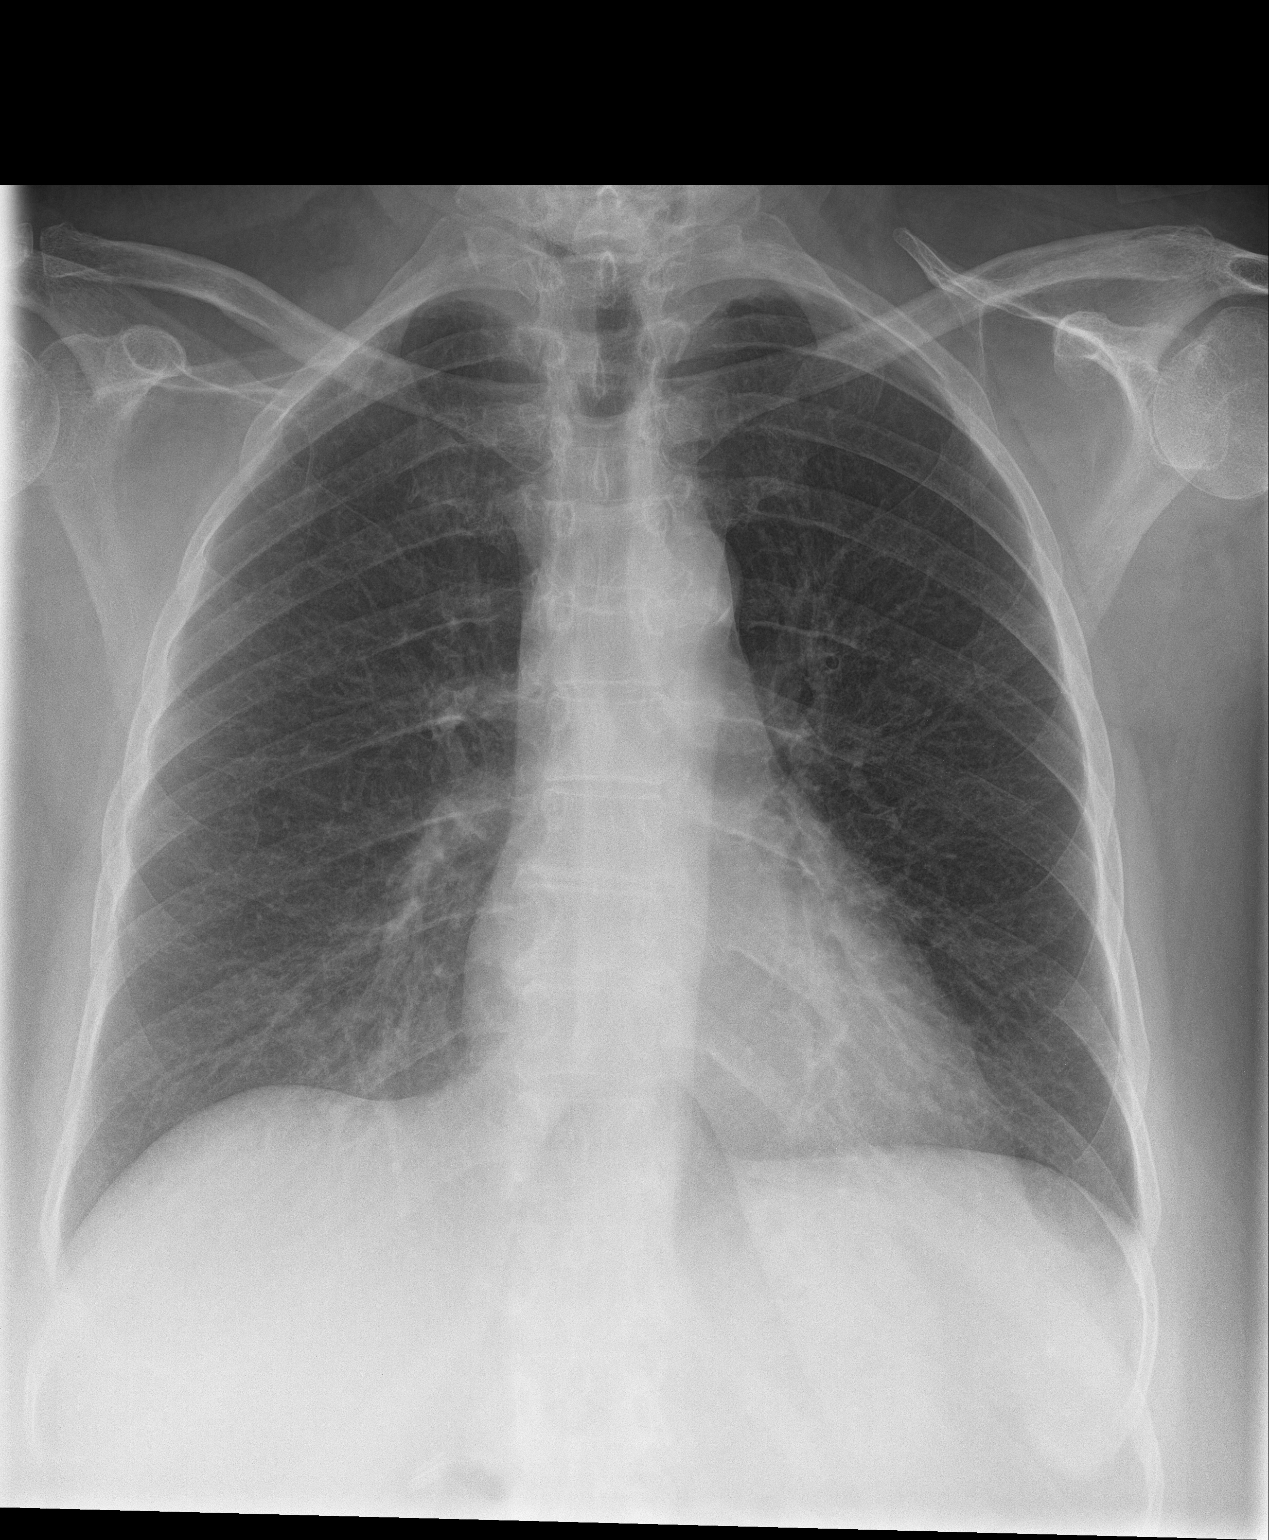

[chest lat]
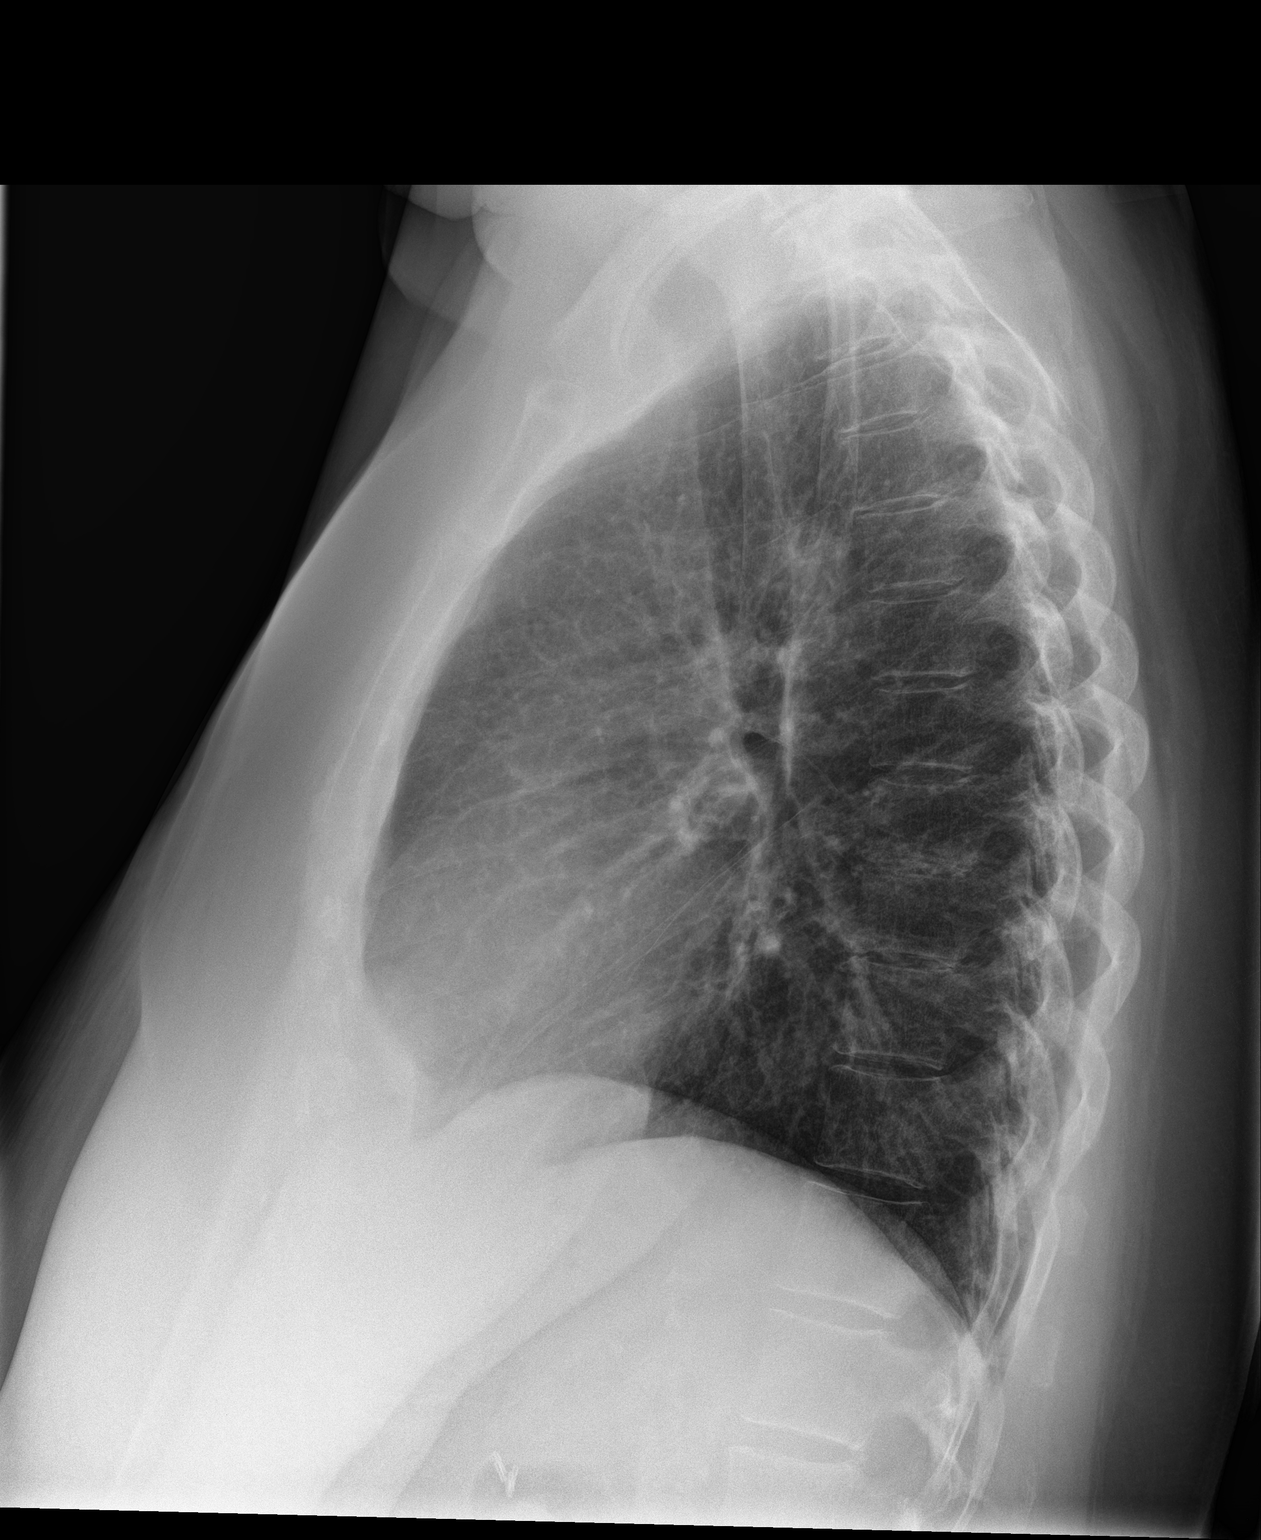

[2 of 2 positions shown; findings below may reference images not displayed]

FINDINGS: The heart size and mediastinal contours are within normal limits.
Atherosclerosis of thoracic aorta is noted. Both lungs are clear. No
pneumothorax or pleural effusion is noted. The visualized skeletal
structures are unremarkable.
IMPRESSION: No active cardiopulmonary disease.  Aortic atherosclerosis.

## 2017-02-17 MED ORDER — CEPHALEXIN 500 MG PO CAPS: 500.0000 mg | ORAL_CAPSULE | Freq: Four times a day (QID) | ORAL | 0 refills | Status: DC

## 2017-02-17 MED ORDER — CEPHALEXIN 500 MG PO CAPS
500.0000 mg | ORAL_CAPSULE | Freq: Once | ORAL | Status: AC
  Administered 2017-02-17: 500 mg via ORAL
  Filled 2017-02-17: qty 1

## 2017-02-17 NOTE — ED Notes (Signed)
Voluntary consent signed and faxed at this time.

## 2017-02-17 NOTE — Progress Notes (Signed)
Per Melissa at [facility], [facility] has been accepted to their geropsych unit, accepting MD Sabeti. Please call report to [phone]. [facility] requests that Pt be given first does of UTI treatment prior to arrival. Pt can arrive after [place], [place] Work [phone]

## 2017-02-17 NOTE — Progress Notes (Signed)
CSW spoke with Lenna Sciara at [facility] who reports that Pt can sign in voluntarily and that she will fax the form to [facility] [phone]). Please have Pt sign the consent and fax back to [facility].  They are still requesting EKG, chest x-ray, and UA to be completed prior to Pt being officially accepted. [facility] RN aware.   Adriana Reams, LCSW Clinical Social Work [phone]

## 2017-02-17 NOTE — ED Notes (Signed)
Pt left keys with security office for son to pick up. Security has possession, pt acknowledge this. Rest of belongings given to [facility], transport service.

## 2017-02-17 NOTE — Progress Notes (Signed)
Per pt. RN [facility]], regarding IVC has notified Dr., does not feel IVC need be completed. Logan Vegh K. Nash Shearer, LPC-A, [facility]  Counselor 02/17/2017 1:37 AM

## 2017-02-17 NOTE — ED Provider Notes (Signed)
Pt has a uti and needs treatment with keflex 500mg  qid for one week.   Milton Ferguson, MD 02/17/17 1058

## 2017-02-17 NOTE — ED Notes (Signed)
Pt given meal tray at this time 

## 2017-02-17 NOTE — ED Notes (Signed)
Pt's [facility] Counselor Lavina Hamman) [phone].

## 2017-02-17 NOTE — ED Notes (Signed)
Pt given dinner tray at this time.  

## 2017-02-17 NOTE — ED Notes (Signed)
Pt up to restroom with sitter at side.

## 2017-02-20 LAB — URINE CULTURE: Culture: 60000 — AB

## 2017-02-21 ENCOUNTER — Telehealth: Payer: Self-pay | Admitting: *Deleted

## 2017-02-21 NOTE — Telephone Encounter (Signed)
Post ED Visit - Positive Culture Follow-up  Culture report reviewed by antimicrobial stewardship pharmacist:  []  Elenor Quinones, Pharm.D. []  Heide Guile, Pharm.D., BCPS AQ-ID []  Parks Neptune, Pharm.D., BCPS []  Alycia Rossetti, Pharm.D., BCPS []  [place], Pharm.D., BCPS, AAHIVP []  Legrand Como, Pharm.D., BCPS, AAHIVP []  Salome Arnt, PharmD, BCPS []  Dimitri Ped, PharmD, BCPS []  Vincenza Hews, PharmD, BCPS K. Lacinda Axon, Pharm D  Positive urine culture Treated with Cephalexin, organism sensitive to the same and no further patient follow-up is required at this time.  Harlon Flor [facility] 02/21/2017, 9:54 AM

## 2017-03-29 ENCOUNTER — Encounter: Payer: Self-pay | Admitting: Gastroenterology

## 2017-03-29 ENCOUNTER — Ambulatory Visit (INDEPENDENT_AMBULATORY_CARE_PROVIDER_SITE_OTHER): Payer: Medicaid Other | Admitting: Gastroenterology

## 2017-03-29 VITALS — BP 130/74 | HR 73 | Temp 96.7°F | Ht 64.0 in | Wt 200.4 lb

## 2017-03-29 DIAGNOSIS — R112 Nausea with vomiting, unspecified: Secondary | ICD-10-CM

## 2017-03-29 DIAGNOSIS — K580 Irritable bowel syndrome with diarrhea: Secondary | ICD-10-CM | POA: Diagnosis not present

## 2017-03-29 NOTE — Assessment & Plan Note (Signed)
Doing well at this time. Uses imodium at times for diarrhea but does not use routinely. Vomiting stopped with pantoprazole. Has gained back 15 pounds. Continue imodium prn. Return to the office in 1 year or sooner if needed.

## 2017-03-29 NOTE — Progress Notes (Signed)
CC'ED TO PCP 

## 2017-03-29 NOTE — Assessment & Plan Note (Signed)
Resolved on pantoprazole 40mg  daily. She has mild gastroparesis based on GES done last year. Clinically doing well.

## 2017-03-29 NOTE — Patient Instructions (Signed)
1. Continue Imodium as needed for intermittent diarrhea. Do not use more than three per day.  2. You will be due for your next colonoscopy in 02/2020.  3. Return to the office in one year or sooner if needed.

## 2017-03-29 NOTE — Progress Notes (Signed)
Primary Care Physician: Rosita Fire, MD  Primary Gastroenterologist:  Garfield Cornea, MD   Chief Complaint  Patient presents with  . Irritable Bowel Syndrome    no symptoms at this time    HPI: Claudia Keller is a 66 y.o. female here for follow up. She missed couple of appointments since we last saw her in 05/2016. She has a history of adenomas last TCS in 02/2015, due surveillance colonoscopy in 2021. Previously had negative segmental colon biopsies for chronic diarrhea. Was on Viberzi but that was discontinued due to contraindication in postcholecystectomy patients became about after she was started on the medication.  Patient has gained 15 pounds since we saw her in July 2017.Patient seen in the ED back in April with suicidal ideation. Evaluated by behavioral medicine. Labs negative for Tylenol, urine drug screen was negative, LFTs were normal, CBC normal, ethanol level negative. Salicylate level negative. Went for inpatient psychiatric evaluation for 7 days. She has been inpatient for psych issues couple of times in the past year.   Feeling better. States she is sleeping well. No longer vomiting. protonix helps. She takes imodium as needed for diarrhea but does not have to take routinely. No melena, brbpr. No abdominal pain. She believes the remeron is helping her ibs.     Current Outpatient Prescriptions  Medication Sig Dispense Refill  . albuterol (PROVENTIL HFA;VENTOLIN HFA) 108 (90 BASE) MCG/ACT inhaler Inhale 1-2 puffs into the lungs every 6 (six) hours as needed for wheezing or shortness of breath.     Marland Kitchen amLODipine (NORVASC) 10 MG tablet Take 10 mg by mouth daily.     Marland Kitchen aspirin EC 81 MG tablet Take 81 mg by mouth daily.    . cetirizine (ZYRTEC) 10 MG tablet Take 10 mg by mouth daily.     . divalproex (DEPAKOTE) 250 MG DR tablet Take 250 mg by mouth every 8 (eight) hours.    . furosemide (LASIX) 20 MG tablet Take 20 mg by mouth daily.     Marland Kitchen gabapentin (NEURONTIN) 100 MG  capsule Take 100 mg by mouth 3 (three) times daily.     . insulin glargine (LANTUS) 100 UNIT/ML injection Inject 60 Units into the skin at bedtime.    . insulin lispro (HUMALOG) 100 UNIT/ML injection Inject 18-24 Units into the skin 3 (three) times daily with meals. Per sliding scale. 151-200=18 units; 201-250= 9 units; 251-300=20 units; 301-350=21 units; 351-400= 22 units; 401-450=23 units; 500-551=24 units.    Marland Kitchen levothyroxine (SYNTHROID, LEVOTHROID) 125 MCG tablet Take 125 mcg by mouth daily before breakfast.    . linagliptin (TRADJENTA) 5 MG TABS tablet Take 5 mg by mouth daily.     Marland Kitchen loperamide (IMODIUM) 2 MG capsule Take by mouth as needed for diarrhea or loose stools.    . mirtazapine (REMERON SOL-TAB) 15 MG disintegrating tablet Take 15 mg by mouth at bedtime.    . naproxen sodium (ALEVE) 220 MG tablet Take 220-440 mg by mouth daily as needed (for pain).    . pantoprazole (PROTONIX) 40 MG tablet Take 40 mg by mouth daily before breakfast.    . PARoxetine (PAXIL) 40 MG tablet Take 40 mg by mouth daily.     . QUEtiapine (SEROQUEL) 50 MG tablet Take 150 mg by mouth at bedtime.    . simvastatin (ZOCOR) 40 MG tablet Take 40 mg by mouth at bedtime.      No current facility-administered medications for this visit.     Allergies  as of 03/29/2017  . (No Known Allergies)    ROS:  General: Negative for anorexia, weight loss, fever, chills, fatigue, weakness. ENT: Negative for hoarseness, difficulty swallowing , nasal congestion. CV: Negative for chest pain, angina, palpitations, dyspnea on exertion, peripheral edema.  Respiratory: Negative for dyspnea at rest, dyspnea on exertion, cough, sputum, wheezing.  GI: See history of present illness. GU:  Negative for dysuria, hematuria, urinary incontinence, urinary frequency, nocturnal urination.  Endo: Negative for unusual weight change.    Physical Examination:   BP 130/74   Pulse 73   Temp (!) 96.7 F (35.9 C) (Oral)   Ht 5\' 4"  (1.626 m)    Wt 200 lb 6.4 oz (90.9 kg)   BMI 34.40 kg/m   General: Well-nourished, well-developed in no acute distress.  Eyes: No icterus. Mouth: Oropharyngeal mucosa moist and pink , no lesions erythema or exudate. Lungs: Clear to auscultation bilaterally.  Heart: Regular rate and rhythm, no murmurs rubs or gallops.  Abdomen: Bowel sounds are normal, nontender, nondistended, no hepatosplenomegaly or masses, no abdominal bruits or hernia , no rebound or guarding.   Extremities: No lower extremity edema. No clubbing or deformities. Neuro: Alert and oriented x 4   Skin: Warm and dry, no jaundice.   Psych: Alert and cooperative, normal mood and affect.  Labs:  Lab Results  Component Value Date   WBC 9.1 02/16/2017   HGB 14.3 02/16/2017   HCT 42.8 02/16/2017   MCV 90.5 02/16/2017   PLT 179 02/16/2017   Lab Results  Component Value Date   ALT 26 02/16/2017   AST 22 02/16/2017   ALKPHOS 118 02/16/2017   BILITOT 0.4 02/16/2017   Lab Results  Component Value Date   CREATININE 1.01 (H) 02/16/2017   BUN 32 (H) 02/16/2017   NA 141 02/16/2017   K 4.1 02/16/2017   CL 105 02/16/2017   CO2 28 02/16/2017     Imaging Studies: No results found.

## 2017-08-03 ENCOUNTER — Other Ambulatory Visit (HOSPITAL_COMMUNITY): Payer: Self-pay | Admitting: Internal Medicine

## 2017-08-03 DIAGNOSIS — R921 Mammographic calcification found on diagnostic imaging of breast: Secondary | ICD-10-CM

## 2017-08-03 DIAGNOSIS — Z09 Encounter for follow-up examination after completed treatment for conditions other than malignant neoplasm: Secondary | ICD-10-CM

## 2017-08-11 DIAGNOSIS — E1165 Type 2 diabetes mellitus with hyperglycemia: Secondary | ICD-10-CM | POA: Diagnosis not present

## 2017-08-16 ENCOUNTER — Ambulatory Visit (HOSPITAL_COMMUNITY)
Admission: RE | Admit: 2017-08-16 | Discharge: 2017-08-16 | Disposition: A | Discharge status: Home / Self Care | Payer: Medicare Other | Source: Ambulatory Visit | Attending: Internal Medicine | Admitting: Internal Medicine

## 2017-08-16 DIAGNOSIS — R928 Other abnormal and inconclusive findings on diagnostic imaging of breast: Secondary | ICD-10-CM | POA: Diagnosis not present

## 2017-08-16 DIAGNOSIS — Z09 Encounter for follow-up examination after completed treatment for conditions other than malignant neoplasm: Secondary | ICD-10-CM

## 2017-08-16 DIAGNOSIS — R921 Mammographic calcification found on diagnostic imaging of breast: Secondary | ICD-10-CM | POA: Insufficient documentation

## 2017-08-16 IMAGING — MG 2D DIGITAL DIAGNOSTIC BILATERAL MAMMOGRAM WITH CAD AND ADJUNCT T
8 of 18 series · 8 of 38 positions shown · non-contrast
Comparison: Previous exam(s).

CLINICAL DATA: Followup for probably benign bilateral breast
calcifications.

EXAM:
2D DIGITAL DIAGNOSTIC BILATERAL MAMMOGRAM WITH CAD AND ADJUNCT TOMO

[R MLO]
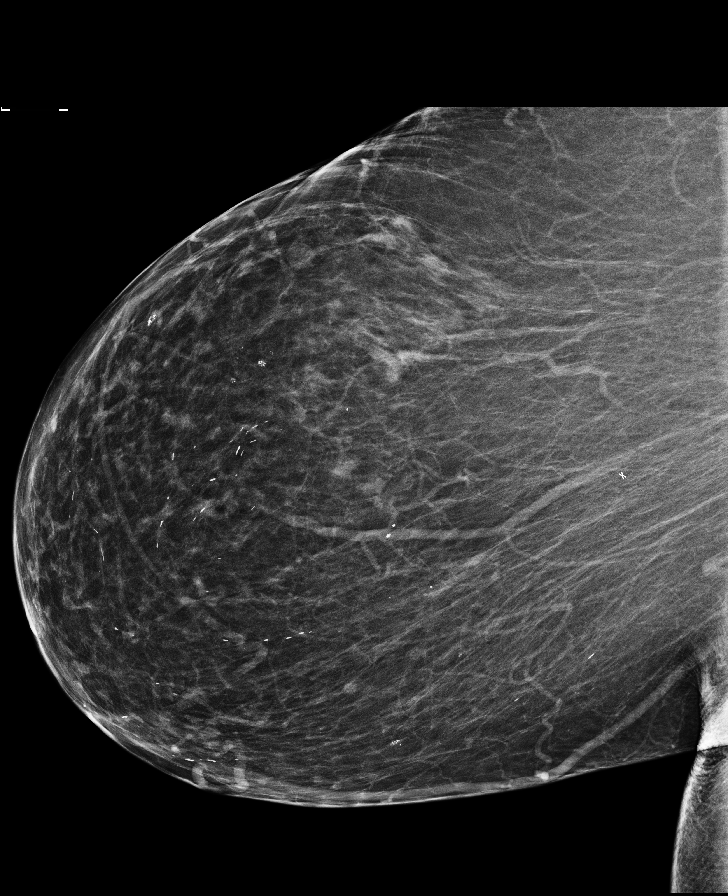

[R ML (1 of 3)]
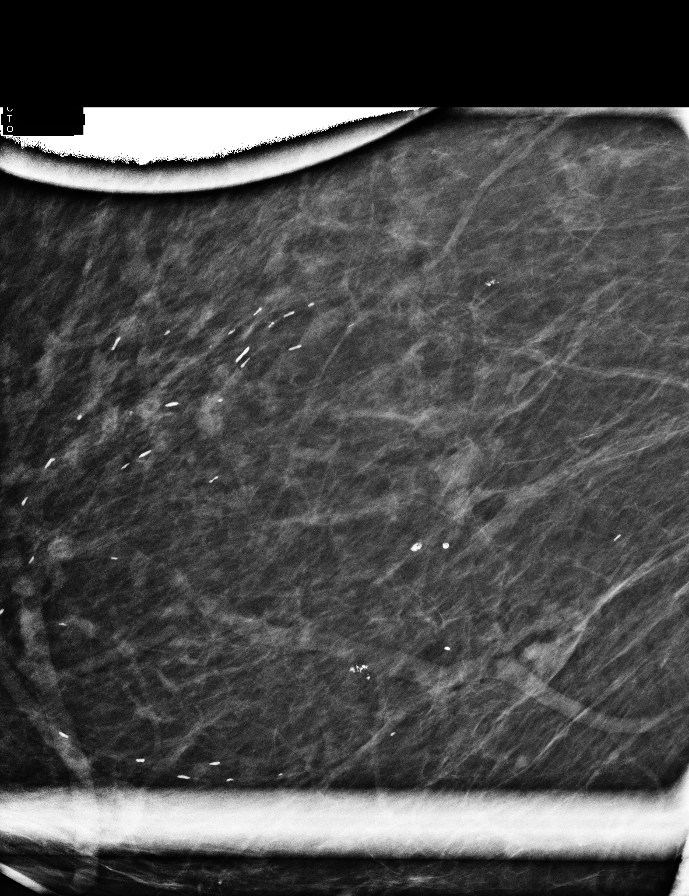

[R ML (2 of 3)]
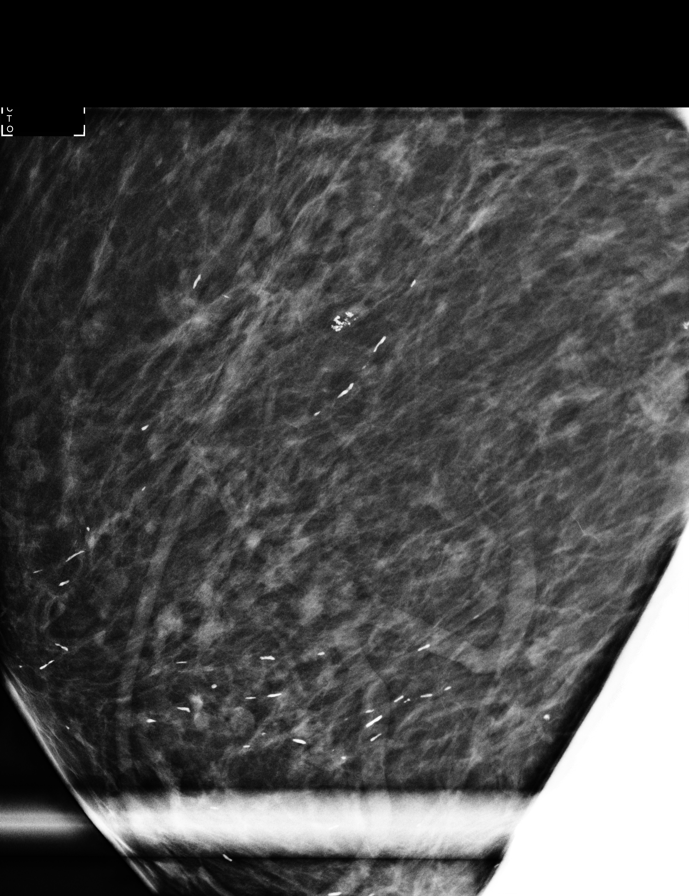

[R CC (1 of 2)]
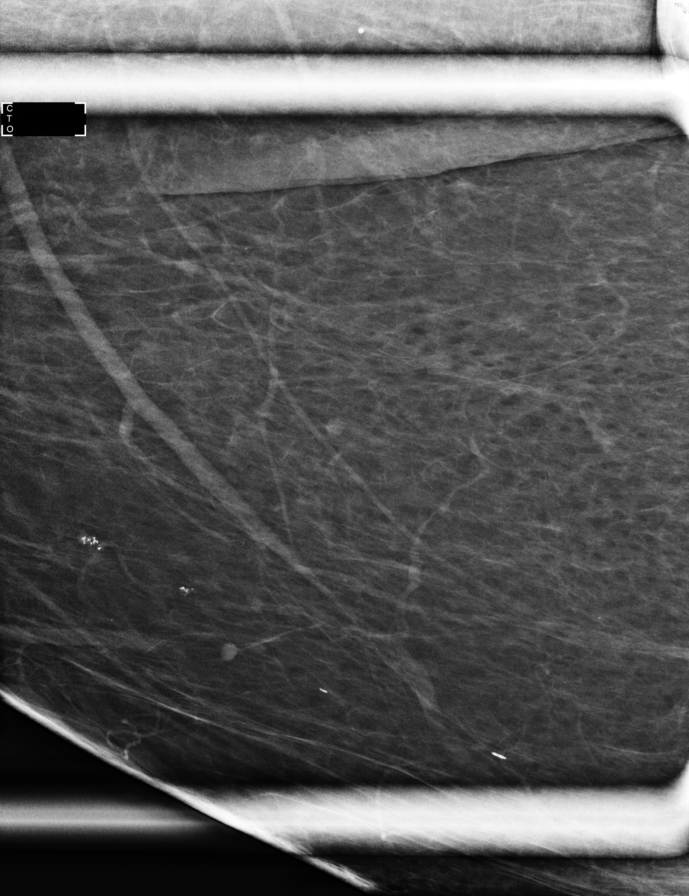

[R CC (2 of 2)]
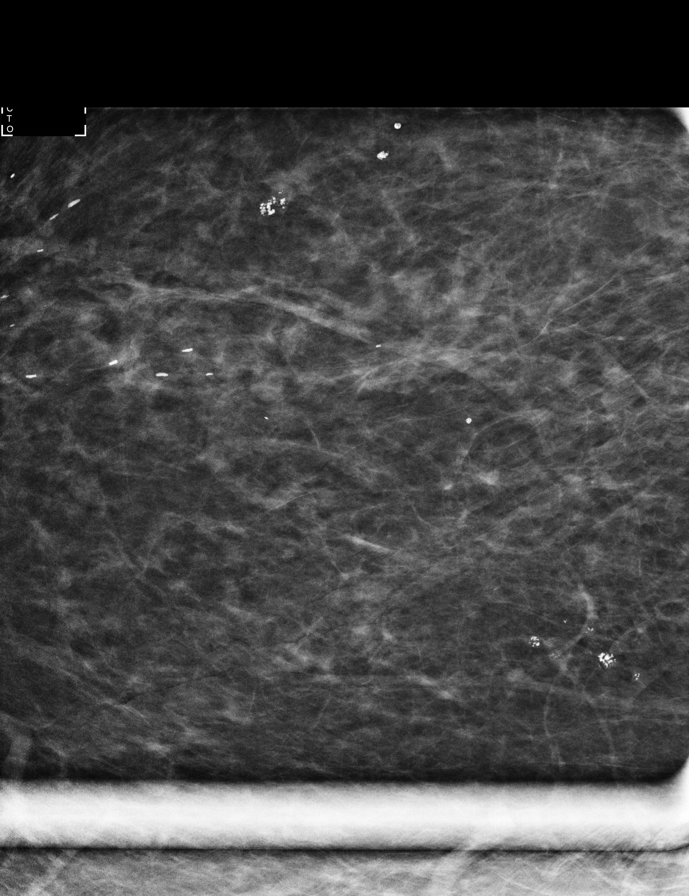

[R ML (3 of 3)]
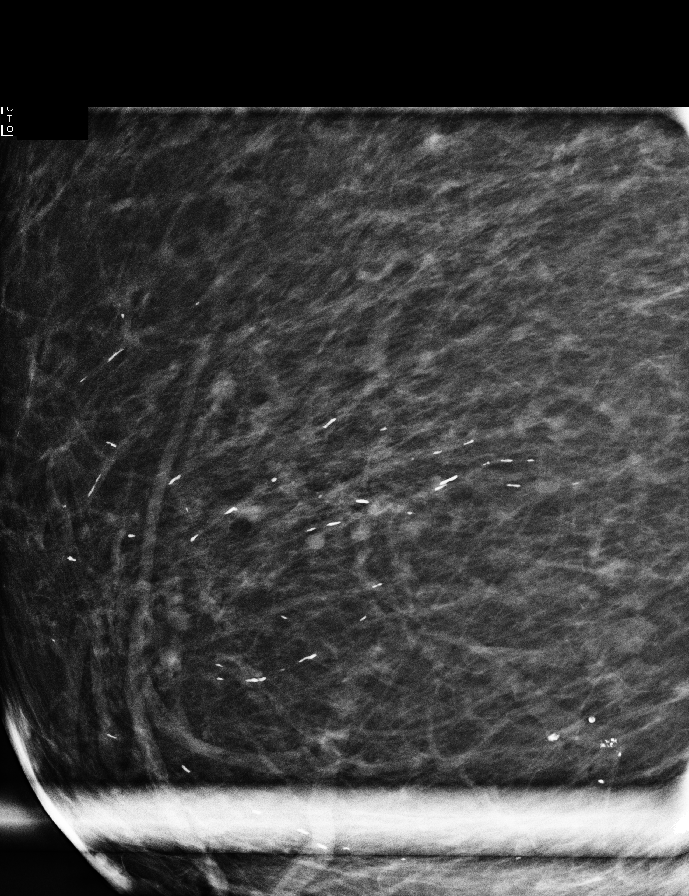

[L ML]
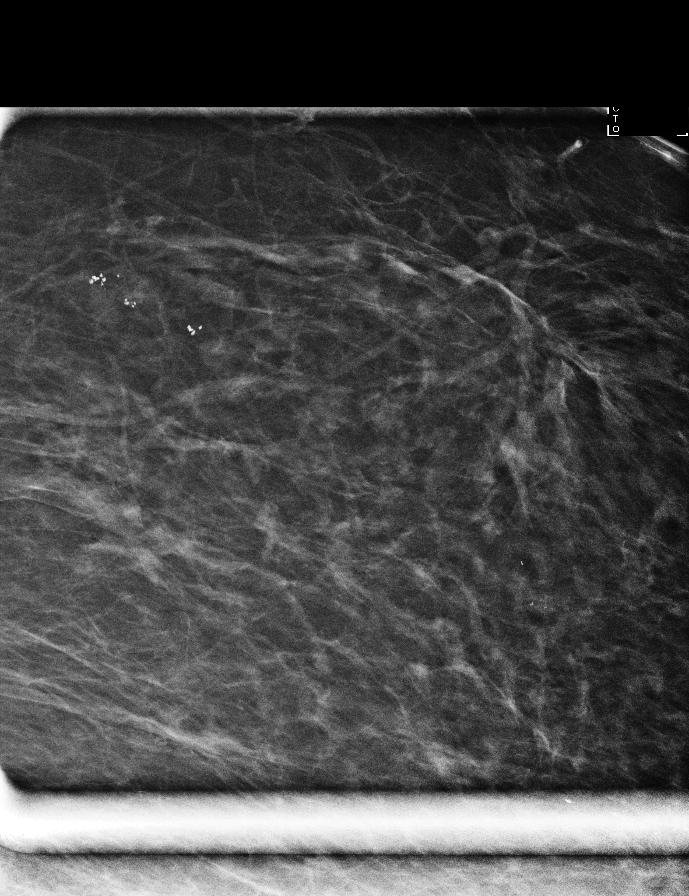

[L CC]
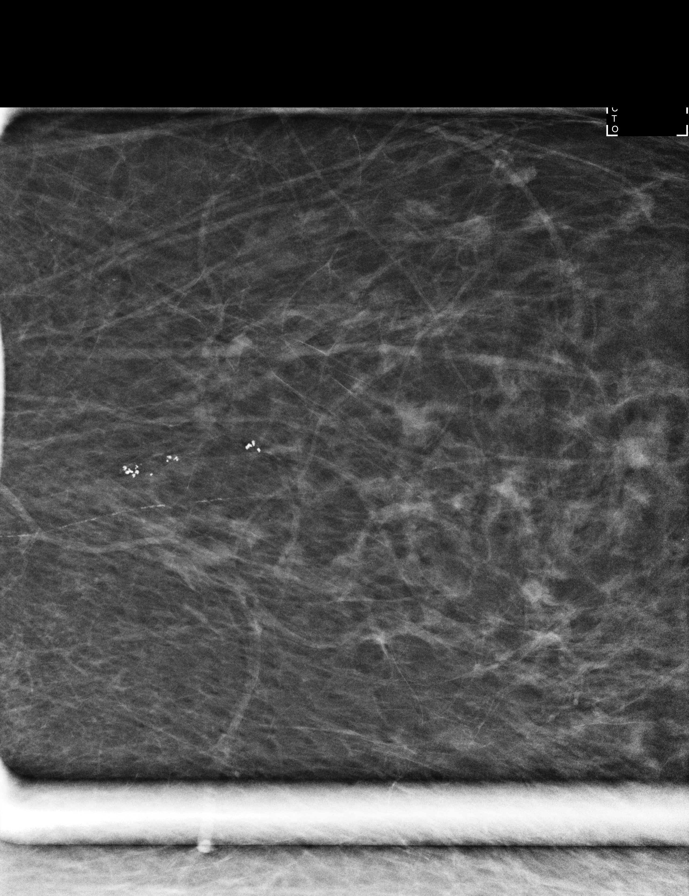

[8 of 38 positions shown; findings below may reference images not displayed]

ACR Breast Density Category b: There are scattered areas of
fibroglandular density.
FINDINGS: No suspicious masses or calcifications are seen in either breast.
Initially questioned asymmetry in the outer right breast resolves on
the additional spot compression CC tomograms with findings
compatible with an area of overlapping fibroglandular tissue. Spot
compression magnification views were performed over both the outer
and inner right breast demonstrating continued evolution of 2 groups
of coarse dystrophic type calcifications. Spot compression
magnification views were performed over the outer left breast
demonstrating continued evolution a 1.8 cm group of coarse
dystrophic type calcifications. There are no mammographic findings
of malignancy in either breast.

Mammographic images were processed with CAD.
IMPRESSION: Evolving coarse likely dystrophic bilateral breast calcifications,
overall stable in appearance. There is no mammographic evidence of
malignancy in either breast.

RECOMMENDATION:
Bilateral diagnostic mammography in 12 months which will demonstrate
2 years of stability of the benign appearing bilateral breast
calcifications.

I have discussed the findings and recommendations with the patient.
Results were also provided in writing at the conclusion of the
visit. If applicable, a reminder letter will be sent to the patient
regarding the next appointment.

BI-RADS CATEGORY  3: Probably benign.

## 2017-09-15 DIAGNOSIS — E1165 Type 2 diabetes mellitus with hyperglycemia: Secondary | ICD-10-CM | POA: Diagnosis not present

## 2017-09-20 DIAGNOSIS — Z7689 Persons encountering health services in other specified circumstances: Secondary | ICD-10-CM | POA: Diagnosis not present

## 2017-09-27 DIAGNOSIS — Z7689 Persons encountering health services in other specified circumstances: Secondary | ICD-10-CM | POA: Diagnosis not present

## 2017-10-15 DIAGNOSIS — E1165 Type 2 diabetes mellitus with hyperglycemia: Secondary | ICD-10-CM | POA: Diagnosis not present

## 2017-11-05 DIAGNOSIS — E1110 Type 2 diabetes mellitus with ketoacidosis without coma: Secondary | ICD-10-CM | POA: Diagnosis not present

## 2017-11-05 DIAGNOSIS — E1310 Other specified diabetes mellitus with ketoacidosis without coma: Secondary | ICD-10-CM | POA: Diagnosis not present

## 2017-11-05 DIAGNOSIS — R0902 Hypoxemia: Secondary | ICD-10-CM | POA: Diagnosis not present

## 2017-11-05 DIAGNOSIS — J449 Chronic obstructive pulmonary disease, unspecified: Secondary | ICD-10-CM | POA: Diagnosis not present

## 2017-11-05 DIAGNOSIS — E039 Hypothyroidism, unspecified: Secondary | ICD-10-CM | POA: Diagnosis not present

## 2017-11-05 DIAGNOSIS — I10 Essential (primary) hypertension: Secondary | ICD-10-CM | POA: Diagnosis not present

## 2017-11-05 DIAGNOSIS — R918 Other nonspecific abnormal finding of lung field: Secondary | ICD-10-CM | POA: Diagnosis not present

## 2017-11-05 DIAGNOSIS — J189 Pneumonia, unspecified organism: Secondary | ICD-10-CM | POA: Diagnosis not present

## 2017-11-05 DIAGNOSIS — R079 Chest pain, unspecified: Secondary | ICD-10-CM | POA: Diagnosis not present

## 2017-11-05 DIAGNOSIS — J9601 Acute respiratory failure with hypoxia: Secondary | ICD-10-CM | POA: Diagnosis not present

## 2017-11-05 DIAGNOSIS — R0789 Other chest pain: Secondary | ICD-10-CM | POA: Diagnosis not present

## 2017-11-05 DIAGNOSIS — R109 Unspecified abdominal pain: Secondary | ICD-10-CM | POA: Diagnosis not present

## 2017-11-05 DIAGNOSIS — R000 Tachycardia, unspecified: Secondary | ICD-10-CM | POA: Diagnosis not present

## 2017-11-05 DIAGNOSIS — K760 Fatty (change of) liver, not elsewhere classified: Secondary | ICD-10-CM | POA: Diagnosis not present

## 2017-11-05 DIAGNOSIS — J181 Lobar pneumonia, unspecified organism: Secondary | ICD-10-CM | POA: Diagnosis not present

## 2017-11-05 DIAGNOSIS — E1142 Type 2 diabetes mellitus with diabetic polyneuropathy: Secondary | ICD-10-CM | POA: Diagnosis not present

## 2017-11-05 DIAGNOSIS — K5730 Diverticulosis of large intestine without perforation or abscess without bleeding: Secondary | ICD-10-CM | POA: Diagnosis not present

## 2017-11-05 DIAGNOSIS — R112 Nausea with vomiting, unspecified: Secondary | ICD-10-CM | POA: Diagnosis not present

## 2017-11-05 DIAGNOSIS — E869 Volume depletion, unspecified: Secondary | ICD-10-CM | POA: Diagnosis not present

## 2017-11-05 DIAGNOSIS — E119 Type 2 diabetes mellitus without complications: Secondary | ICD-10-CM | POA: Diagnosis not present

## 2017-11-05 DIAGNOSIS — K589 Irritable bowel syndrome without diarrhea: Secondary | ICD-10-CM | POA: Diagnosis not present

## 2017-11-05 DIAGNOSIS — E785 Hyperlipidemia, unspecified: Secondary | ICD-10-CM | POA: Diagnosis not present

## 2017-11-05 DIAGNOSIS — J690 Pneumonitis due to inhalation of food and vomit: Secondary | ICD-10-CM | POA: Diagnosis not present

## 2017-11-05 DIAGNOSIS — J9621 Acute and chronic respiratory failure with hypoxia: Secondary | ICD-10-CM | POA: Diagnosis not present

## 2017-11-05 DIAGNOSIS — Z794 Long term (current) use of insulin: Secondary | ICD-10-CM | POA: Diagnosis not present

## 2017-11-05 DIAGNOSIS — E1140 Type 2 diabetes mellitus with diabetic neuropathy, unspecified: Secondary | ICD-10-CM | POA: Diagnosis not present

## 2017-11-05 DIAGNOSIS — R1110 Vomiting, unspecified: Secondary | ICD-10-CM | POA: Diagnosis not present

## 2017-11-05 DIAGNOSIS — Z9049 Acquired absence of other specified parts of digestive tract: Secondary | ICD-10-CM | POA: Diagnosis not present

## 2017-11-05 DIAGNOSIS — Z9114 Patient's other noncompliance with medication regimen: Secondary | ICD-10-CM | POA: Diagnosis not present

## 2017-11-05 DIAGNOSIS — E1165 Type 2 diabetes mellitus with hyperglycemia: Secondary | ICD-10-CM | POA: Diagnosis not present

## 2017-11-06 DIAGNOSIS — E870 Hyperosmolality and hypernatremia: Secondary | ICD-10-CM

## 2017-11-06 HISTORY — DX: Hyperosmolality and hypernatremia: E87.0

## 2017-11-15 DIAGNOSIS — E1165 Type 2 diabetes mellitus with hyperglycemia: Secondary | ICD-10-CM | POA: Diagnosis not present

## 2017-11-15 DIAGNOSIS — J449 Chronic obstructive pulmonary disease, unspecified: Secondary | ICD-10-CM | POA: Diagnosis not present

## 2017-11-25 DIAGNOSIS — J449 Chronic obstructive pulmonary disease, unspecified: Secondary | ICD-10-CM | POA: Diagnosis not present

## 2017-11-25 DIAGNOSIS — M150 Primary generalized (osteo)arthritis: Secondary | ICD-10-CM | POA: Diagnosis not present

## 2017-11-29 DIAGNOSIS — Z7689 Persons encountering health services in other specified circumstances: Secondary | ICD-10-CM | POA: Diagnosis not present

## 2017-11-30 ENCOUNTER — Other Ambulatory Visit (HOSPITAL_COMMUNITY): Payer: Self-pay | Admitting: Internal Medicine

## 2017-11-30 ENCOUNTER — Ambulatory Visit (HOSPITAL_COMMUNITY)
Admission: RE | Admit: 2017-11-30 | Discharge: 2017-11-30 | Disposition: A | Discharge status: Home / Self Care | Payer: Medicare Other | Source: Ambulatory Visit | Attending: Internal Medicine | Admitting: Internal Medicine

## 2017-11-30 DIAGNOSIS — I700 Atherosclerosis of aorta: Secondary | ICD-10-CM | POA: Diagnosis not present

## 2017-11-30 DIAGNOSIS — J181 Lobar pneumonia, unspecified organism: Secondary | ICD-10-CM | POA: Diagnosis not present

## 2017-11-30 DIAGNOSIS — R918 Other nonspecific abnormal finding of lung field: Secondary | ICD-10-CM | POA: Diagnosis not present

## 2017-11-30 DIAGNOSIS — J189 Pneumonia, unspecified organism: Secondary | ICD-10-CM | POA: Diagnosis present

## 2017-11-30 DIAGNOSIS — J153 Pneumonia due to streptococcus, group B: Secondary | ICD-10-CM

## 2017-11-30 IMAGING — DX DG CHEST 2V
2 series · 2 of 2 positions shown · non-contrast
Comparison: Chest x-ray of [DATE]

CLINICAL DATA: Recent bilateral upper lobe pneumonia. No current
complaints. History of COPD, current smoker

EXAM:
CHEST  2 VIEW

[chest pa]
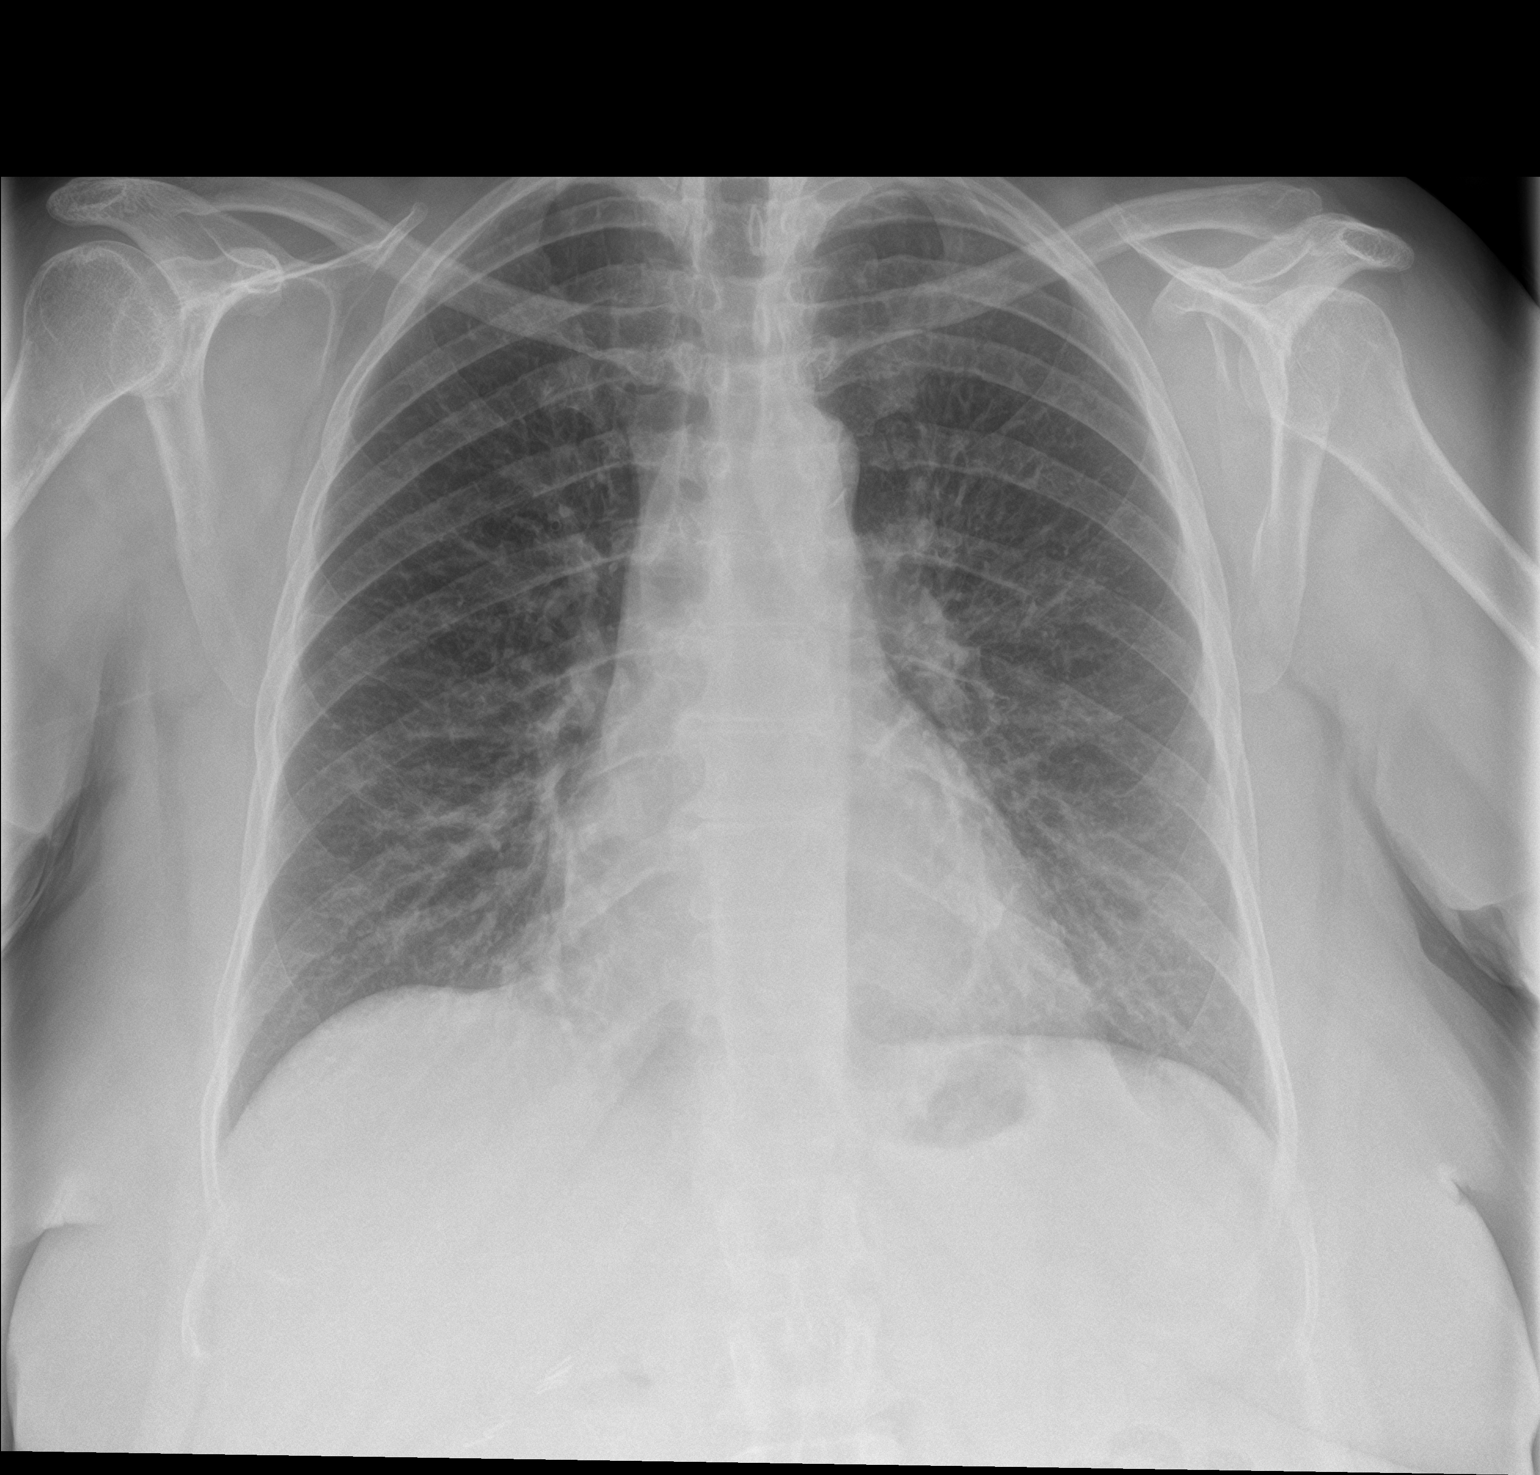

[chest lat]
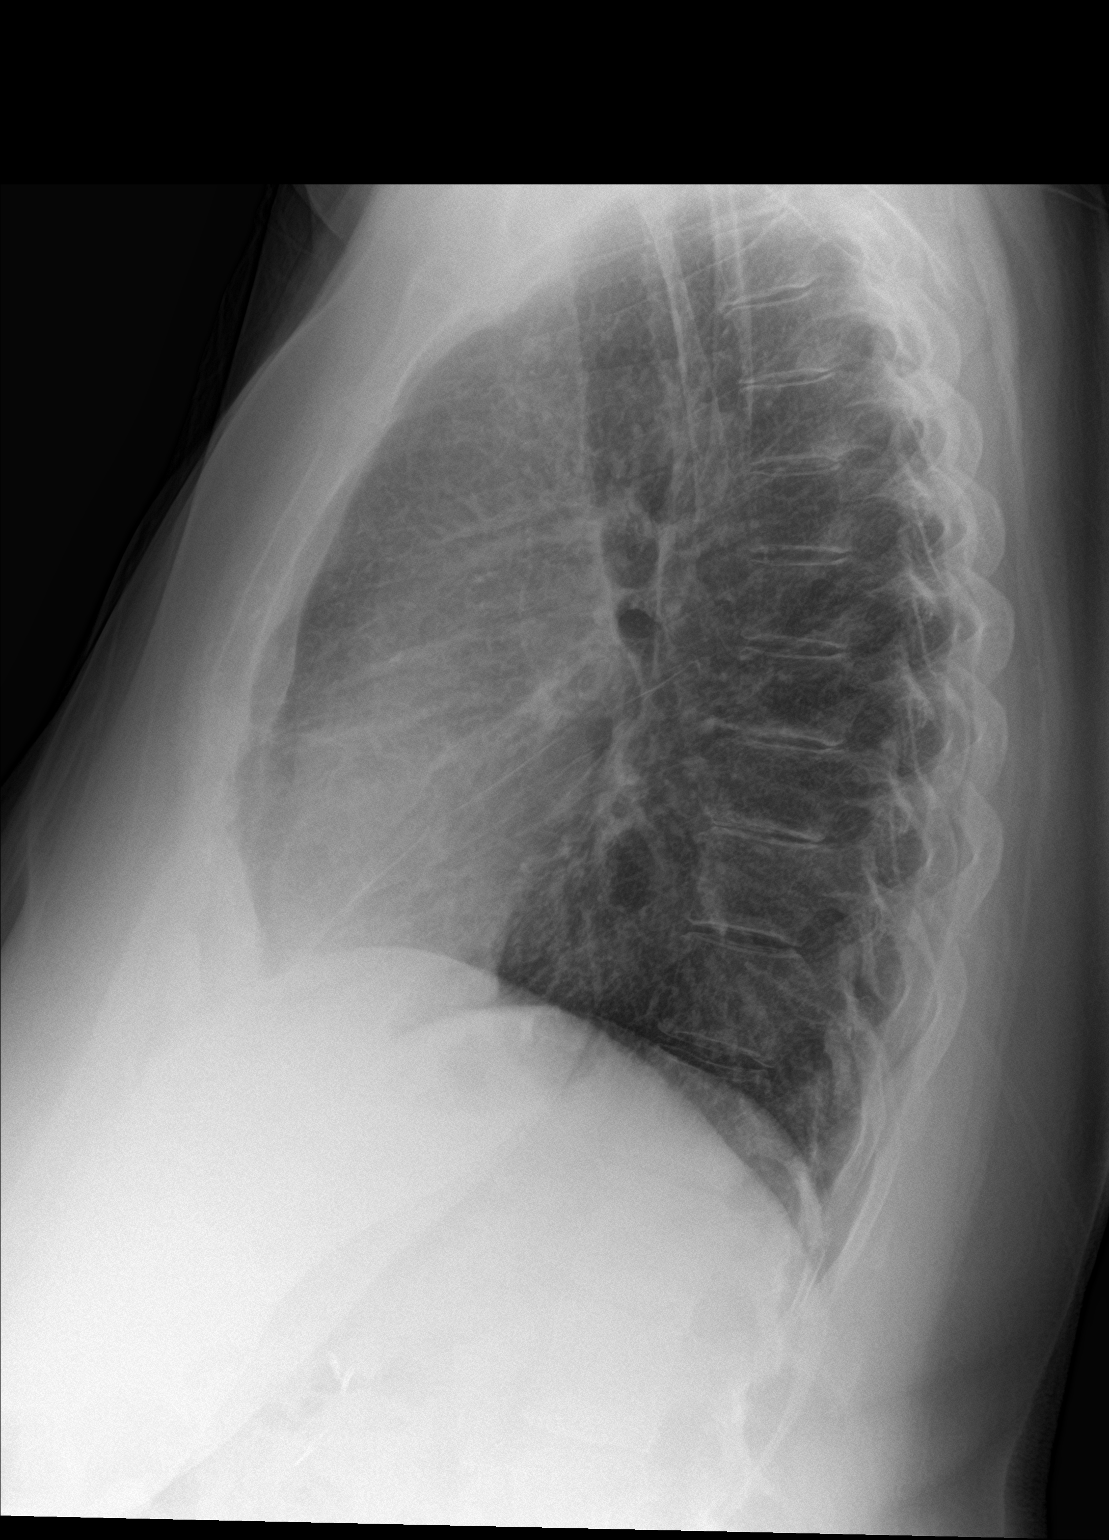

[2 of 2 positions shown; findings below may reference images not displayed]

FINDINGS: The lungs are adequately inflated. The interstitial markings are
mildly prominent. There is no alveolar infiltrate or pleural
effusion. The heart and pulmonary vascularity are normal. There
calcification in the wall of the aortic arch. There is mild
multilevel degenerative disc disease of the thoracic spine.
IMPRESSION: Chronic bronchitic change, stable. Mild increase in the interstitial
markings bilaterally may reflect residual interstitial pneumonia or
subsegmental atelectasis. No alveolar pneumonia nor CHF.

Thoracic aortic atherosclerosis.

## 2017-12-16 DIAGNOSIS — E1165 Type 2 diabetes mellitus with hyperglycemia: Secondary | ICD-10-CM | POA: Diagnosis not present

## 2017-12-19 DIAGNOSIS — I10 Essential (primary) hypertension: Secondary | ICD-10-CM | POA: Diagnosis not present

## 2018-01-13 DIAGNOSIS — E1165 Type 2 diabetes mellitus with hyperglycemia: Secondary | ICD-10-CM | POA: Diagnosis not present

## 2018-02-07 ENCOUNTER — Encounter: Payer: Self-pay | Admitting: Internal Medicine

## 2018-02-13 DIAGNOSIS — Z7689 Persons encountering health services in other specified circumstances: Secondary | ICD-10-CM | POA: Diagnosis not present

## 2018-02-13 DIAGNOSIS — I10 Essential (primary) hypertension: Secondary | ICD-10-CM | POA: Diagnosis not present

## 2018-02-13 DIAGNOSIS — J449 Chronic obstructive pulmonary disease, unspecified: Secondary | ICD-10-CM | POA: Diagnosis not present

## 2018-02-13 DIAGNOSIS — E039 Hypothyroidism, unspecified: Secondary | ICD-10-CM | POA: Diagnosis not present

## 2018-02-13 DIAGNOSIS — E669 Obesity, unspecified: Secondary | ICD-10-CM | POA: Diagnosis not present

## 2018-02-13 DIAGNOSIS — F419 Anxiety disorder, unspecified: Secondary | ICD-10-CM | POA: Diagnosis not present

## 2018-02-13 DIAGNOSIS — E785 Hyperlipidemia, unspecified: Secondary | ICD-10-CM | POA: Diagnosis not present

## 2018-02-13 DIAGNOSIS — E1165 Type 2 diabetes mellitus with hyperglycemia: Secondary | ICD-10-CM | POA: Diagnosis not present

## 2018-02-13 DIAGNOSIS — R739 Hyperglycemia, unspecified: Secondary | ICD-10-CM | POA: Diagnosis not present

## 2018-02-13 DIAGNOSIS — Z0000 Encounter for general adult medical examination without abnormal findings: Secondary | ICD-10-CM | POA: Diagnosis not present

## 2018-02-16 DIAGNOSIS — Z7689 Persons encountering health services in other specified circumstances: Secondary | ICD-10-CM | POA: Diagnosis not present

## 2018-03-15 DIAGNOSIS — E1165 Type 2 diabetes mellitus with hyperglycemia: Secondary | ICD-10-CM | POA: Diagnosis not present

## 2018-03-15 DIAGNOSIS — R32 Unspecified urinary incontinence: Secondary | ICD-10-CM | POA: Diagnosis not present
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
## 2018-04-15 DIAGNOSIS — E1165 Type 2 diabetes mellitus with hyperglycemia: Secondary | ICD-10-CM | POA: Diagnosis not present

## 2018-04-15 DIAGNOSIS — R32 Unspecified urinary incontinence: Secondary | ICD-10-CM | POA: Diagnosis not present

## 2018-04-17 ENCOUNTER — Ambulatory Visit (INDEPENDENT_AMBULATORY_CARE_PROVIDER_SITE_OTHER): Payer: Medicare HMO | Admitting: Psychiatry

## 2018-04-17 ENCOUNTER — Encounter (INDEPENDENT_AMBULATORY_CARE_PROVIDER_SITE_OTHER): Payer: Self-pay

## 2018-04-17 ENCOUNTER — Encounter (HOSPITAL_COMMUNITY): Payer: Self-pay | Admitting: Psychiatry

## 2018-04-17 VITALS — BP 114/75 | HR 99 | Ht 64.0 in | Wt 191.0 lb

## 2018-04-17 DIAGNOSIS — J449 Chronic obstructive pulmonary disease, unspecified: Secondary | ICD-10-CM

## 2018-04-17 DIAGNOSIS — E119 Type 2 diabetes mellitus without complications: Secondary | ICD-10-CM | POA: Diagnosis not present

## 2018-04-17 DIAGNOSIS — F419 Anxiety disorder, unspecified: Secondary | ICD-10-CM | POA: Diagnosis not present

## 2018-04-17 DIAGNOSIS — F603 Borderline personality disorder: Secondary | ICD-10-CM | POA: Diagnosis not present

## 2018-04-17 DIAGNOSIS — F17210 Nicotine dependence, cigarettes, uncomplicated: Secondary | ICD-10-CM | POA: Diagnosis not present

## 2018-04-17 DIAGNOSIS — R4581 Low self-esteem: Secondary | ICD-10-CM

## 2018-04-17 DIAGNOSIS — F4310 Post-traumatic stress disorder, unspecified: Secondary | ICD-10-CM

## 2018-04-17 DIAGNOSIS — G4700 Insomnia, unspecified: Secondary | ICD-10-CM

## 2018-04-17 DIAGNOSIS — F3181 Bipolar II disorder: Secondary | ICD-10-CM

## 2018-04-17 DIAGNOSIS — I10 Essential (primary) hypertension: Secondary | ICD-10-CM | POA: Diagnosis not present

## 2018-04-17 DIAGNOSIS — Z560 Unemployment, unspecified: Secondary | ICD-10-CM | POA: Diagnosis not present

## 2018-04-17 DIAGNOSIS — Z915 Personal history of self-harm: Secondary | ICD-10-CM

## 2018-04-17 DIAGNOSIS — Z62810 Personal history of physical and sexual abuse in childhood: Secondary | ICD-10-CM

## 2018-04-17 DIAGNOSIS — Z818 Family history of other mental and behavioral disorders: Secondary | ICD-10-CM

## 2018-04-17 DIAGNOSIS — Z91419 Personal history of unspecified adult abuse: Secondary | ICD-10-CM

## 2018-04-17 NOTE — Patient Instructions (Addendum)
1. Continue current medication  2. Check blood test (TSH, CMP, CBC) 3. Return to clinic in one month for 30 mins 4. Referral to therapy

## 2018-05-10 DIAGNOSIS — F3181 Bipolar II disorder: Secondary | ICD-10-CM | POA: Diagnosis not present

## 2018-05-11 LAB — CBC
HCT: 45.2 % — ABNORMAL HIGH (ref 35.0–45.0)
Hemoglobin: 15 g/dL (ref 11.7–15.5)
MCH: 31.6 pg (ref 27.0–33.0)
MCHC: 33.2 g/dL (ref 32.0–36.0)
MCV: 95.2 fL (ref 80.0–100.0)
MPV: 13.4 fL — ABNORMAL HIGH (ref 7.5–12.5)
Platelets: 210 10*3/uL (ref 140–400)
RBC: 4.75 10*6/uL (ref 3.80–5.10)
RDW: 13.6 % (ref 11.0–15.0)
WBC: 11.8 10*3/uL — ABNORMAL HIGH (ref 3.8–10.8)

## 2018-05-11 LAB — TSH: TSH: 13.77 mIU/L — ABNORMAL HIGH (ref 0.40–4.50)

## 2018-05-11 LAB — BASIC METABOLIC PANEL
BUN/Creatinine Ratio: 14 (calc) (ref 6–22)
BUN: 18 mg/dL (ref 7–25)
CO2: 25 mmol/L (ref 20–32)
Calcium: 9.3 mg/dL (ref 8.6–10.4)
Chloride: 96 mmol/L — ABNORMAL LOW (ref 98–110)
Creat: 1.27 mg/dL — ABNORMAL HIGH (ref 0.50–0.99)
Glucose, Bld: 369 mg/dL — ABNORMAL HIGH (ref 65–139)
Potassium: 4.4 mmol/L (ref 3.5–5.3)
Sodium: 134 mmol/L — ABNORMAL LOW (ref 135–146)

## 2018-05-11 LAB — VALPROIC ACID LEVEL: Valproic Acid Lvl: 74.8 mg/L (ref 50.0–100.0)

## 2018-05-12 ENCOUNTER — Encounter (HOSPITAL_COMMUNITY): Payer: Self-pay | Admitting: Psychiatry

## 2018-05-12 ENCOUNTER — Telehealth (HOSPITAL_COMMUNITY): Payer: Self-pay | Admitting: Psychiatry

## 2018-05-12 NOTE — Telephone Encounter (Signed)
Per Provider: contacted the patient that there were some abnormality in her recent blood test (especially glucose, thyroid, kidney). We will send her the letter in case she does not have access to My Chart. Please advise her to be followed by her primary care doctor for further evaluation

## 2018-05-12 NOTE — Telephone Encounter (Signed)
Could you contact the patient that there were some abnormality in her recent blood test (especially glucose, thyroid, kidney). We will send her the letter in case she does not have access to My Chart. Please advise her to be followed by her primary care doctor for further evaluation.
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
## 2018-05-15 DIAGNOSIS — R32 Unspecified urinary incontinence: Secondary | ICD-10-CM | POA: Diagnosis not present

## 2018-05-15 DIAGNOSIS — E1165 Type 2 diabetes mellitus with hyperglycemia: Secondary | ICD-10-CM | POA: Diagnosis not present

## 2018-05-16 DIAGNOSIS — E039 Hypothyroidism, unspecified: Secondary | ICD-10-CM | POA: Diagnosis not present

## 2018-05-16 DIAGNOSIS — E1165 Type 2 diabetes mellitus with hyperglycemia: Secondary | ICD-10-CM | POA: Diagnosis not present

## 2018-05-17 ENCOUNTER — Ambulatory Visit (INDEPENDENT_AMBULATORY_CARE_PROVIDER_SITE_OTHER): Payer: Medicare HMO | Admitting: Psychiatry

## 2018-05-17 ENCOUNTER — Encounter: Payer: Self-pay | Admitting: Internal Medicine

## 2018-05-17 ENCOUNTER — Encounter (HOSPITAL_COMMUNITY): Payer: Self-pay | Admitting: Psychiatry

## 2018-05-17 VITALS — BP 110/70 | HR 93 | Ht 64.0 in | Wt 187.0 lb

## 2018-05-17 DIAGNOSIS — F3181 Bipolar II disorder: Secondary | ICD-10-CM

## 2018-05-17 DIAGNOSIS — E039 Hypothyroidism, unspecified: Secondary | ICD-10-CM | POA: Diagnosis not present

## 2018-05-17 DIAGNOSIS — F4310 Post-traumatic stress disorder, unspecified: Secondary | ICD-10-CM

## 2018-05-17 DIAGNOSIS — E1165 Type 2 diabetes mellitus with hyperglycemia: Secondary | ICD-10-CM | POA: Diagnosis not present

## 2018-05-17 NOTE — Patient Instructions (Signed)
1. Continue Paxil 40 mg daily  2. Continue mirtazapine 7.5 mg at night 3. Discontinue sertraline 3. Continue Depakote 250 mg in AM, 1000 mg at night  4. Continue gabapentin 100 mg three times a day  5. Return to clinic in three months for 15 mins

## 2018-05-30 DIAGNOSIS — H5200 Hypermetropia, unspecified eye: Secondary | ICD-10-CM | POA: Diagnosis not present

## 2018-05-30 DIAGNOSIS — H25013 Cortical age-related cataract, bilateral: Secondary | ICD-10-CM | POA: Diagnosis not present

## 2018-06-05 ENCOUNTER — Other Ambulatory Visit: Payer: Self-pay

## 2018-06-06 MED ORDER — PANTOPRAZOLE SODIUM 40 MG PO TBEC: 40.0000 mg | DELAYED_RELEASE_TABLET | Freq: Every day | ORAL | 3 refills | Status: DC

## 2018-06-21 ENCOUNTER — Ambulatory Visit (INDEPENDENT_AMBULATORY_CARE_PROVIDER_SITE_OTHER): Payer: Medicare Other | Admitting: Licensed Clinical Social Worker

## 2018-06-21 ENCOUNTER — Encounter (HOSPITAL_COMMUNITY): Payer: Self-pay | Admitting: Licensed Clinical Social Worker

## 2018-06-21 DIAGNOSIS — F3181 Bipolar II disorder: Secondary | ICD-10-CM | POA: Diagnosis not present
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
## 2018-07-16 DIAGNOSIS — E1165 Type 2 diabetes mellitus with hyperglycemia: Secondary | ICD-10-CM | POA: Diagnosis not present

## 2018-07-26 DIAGNOSIS — H52223 Regular astigmatism, bilateral: Secondary | ICD-10-CM | POA: Diagnosis not present

## 2018-07-26 DIAGNOSIS — H524 Presbyopia: Secondary | ICD-10-CM | POA: Diagnosis not present
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
## 2018-08-17 ENCOUNTER — Ambulatory Visit (INDEPENDENT_AMBULATORY_CARE_PROVIDER_SITE_OTHER): Payer: Medicare Other | Admitting: Psychiatry

## 2018-08-17 ENCOUNTER — Encounter (HOSPITAL_COMMUNITY): Payer: Self-pay | Admitting: Psychiatry

## 2018-08-17 VITALS — BP 150/72 | HR 88 | Ht 64.0 in | Wt 192.0 lb

## 2018-08-17 DIAGNOSIS — F3181 Bipolar II disorder: Secondary | ICD-10-CM | POA: Diagnosis not present

## 2018-08-17 DIAGNOSIS — F17210 Nicotine dependence, cigarettes, uncomplicated: Secondary | ICD-10-CM

## 2018-08-17 DIAGNOSIS — F4310 Post-traumatic stress disorder, unspecified: Secondary | ICD-10-CM

## 2018-08-17 DIAGNOSIS — G4700 Insomnia, unspecified: Secondary | ICD-10-CM | POA: Diagnosis not present

## 2018-08-17 NOTE — Addendum Note (Signed)
Addended by: Norman Clay on: 08/17/2018 12:36 PM   Modules accepted: Orders

## 2018-08-17 NOTE — Patient Instructions (Signed)
1. Continue Paxil 40 mg daily  2. Continue mirtazapine 7.5 mg at night 3. Continue Depakote 250 mg in AM, 1000mg  at night  4. Continue quetiapine 150 mg at night  5. Continue gabapentin 100 mg three times a day  6. Start melatonin 3 mg at night 2 hours before going to bed 7. Return to clinic in three months for 15 mins

## 2018-08-18 DIAGNOSIS — Z23 Encounter for immunization: Secondary | ICD-10-CM | POA: Diagnosis not present

## 2018-08-18 DIAGNOSIS — Z1389 Encounter for screening for other disorder: Secondary | ICD-10-CM | POA: Diagnosis not present

## 2018-08-18 DIAGNOSIS — J410 Simple chronic bronchitis: Secondary | ICD-10-CM | POA: Diagnosis not present

## 2018-08-18 DIAGNOSIS — R7989 Other specified abnormal findings of blood chemistry: Secondary | ICD-10-CM | POA: Diagnosis not present

## 2018-08-18 DIAGNOSIS — I10 Essential (primary) hypertension: Secondary | ICD-10-CM | POA: Diagnosis not present

## 2018-08-18 DIAGNOSIS — E785 Hyperlipidemia, unspecified: Secondary | ICD-10-CM | POA: Diagnosis not present

## 2018-08-18 DIAGNOSIS — E1165 Type 2 diabetes mellitus with hyperglycemia: Secondary | ICD-10-CM | POA: Diagnosis not present

## 2018-08-18 DIAGNOSIS — J449 Chronic obstructive pulmonary disease, unspecified: Secondary | ICD-10-CM | POA: Diagnosis not present

## 2018-08-18 DIAGNOSIS — K580 Irritable bowel syndrome with diarrhea: Secondary | ICD-10-CM | POA: Diagnosis not present

## 2018-08-18 DIAGNOSIS — E039 Hypothyroidism, unspecified: Secondary | ICD-10-CM | POA: Diagnosis not present

## 2018-08-18 DIAGNOSIS — R739 Hyperglycemia, unspecified: Secondary | ICD-10-CM | POA: Diagnosis not present

## 2018-08-21 ENCOUNTER — Other Ambulatory Visit (HOSPITAL_COMMUNITY): Payer: Self-pay | Admitting: Internal Medicine

## 2018-08-21 DIAGNOSIS — R921 Mammographic calcification found on diagnostic imaging of breast: Secondary | ICD-10-CM

## 2018-08-21 DIAGNOSIS — Z780 Asymptomatic menopausal state: Secondary | ICD-10-CM

## 2018-08-22 ENCOUNTER — Ambulatory Visit (HOSPITAL_COMMUNITY)
Admission: RE | Admit: 2018-08-22 | Discharge: 2018-08-22 | Disposition: A | Discharge status: Home / Self Care | Payer: Medicare Other | Source: Ambulatory Visit | Attending: Internal Medicine | Admitting: Internal Medicine

## 2018-08-22 ENCOUNTER — Ambulatory Visit (HOSPITAL_COMMUNITY): Payer: Medicare Other

## 2018-08-22 DIAGNOSIS — Z780 Asymptomatic menopausal state: Secondary | ICD-10-CM

## 2018-08-22 DIAGNOSIS — R921 Mammographic calcification found on diagnostic imaging of breast: Secondary | ICD-10-CM | POA: Insufficient documentation

## 2018-08-22 DIAGNOSIS — M85852 Other specified disorders of bone density and structure, left thigh: Secondary | ICD-10-CM | POA: Diagnosis not present

## 2018-08-22 IMAGING — MG DIGITAL DIAGNOSTIC BILATERAL MAMMOGRAM WITH TOMO AND CAD
8 of 16 series · 8 of 32 positions shown · non-contrast
Comparison: Previous exam(s).

CLINICAL DATA: 67-year-old patient presents for 2 year follow-up of
probably benign bilateral breast calcifications.Patient has a
history of excisional biopsy on the left.

EXAM:
DIGITAL DIAGNOSTIC BILATERAL MAMMOGRAM WITH CAD AND TOMO

[L CC]
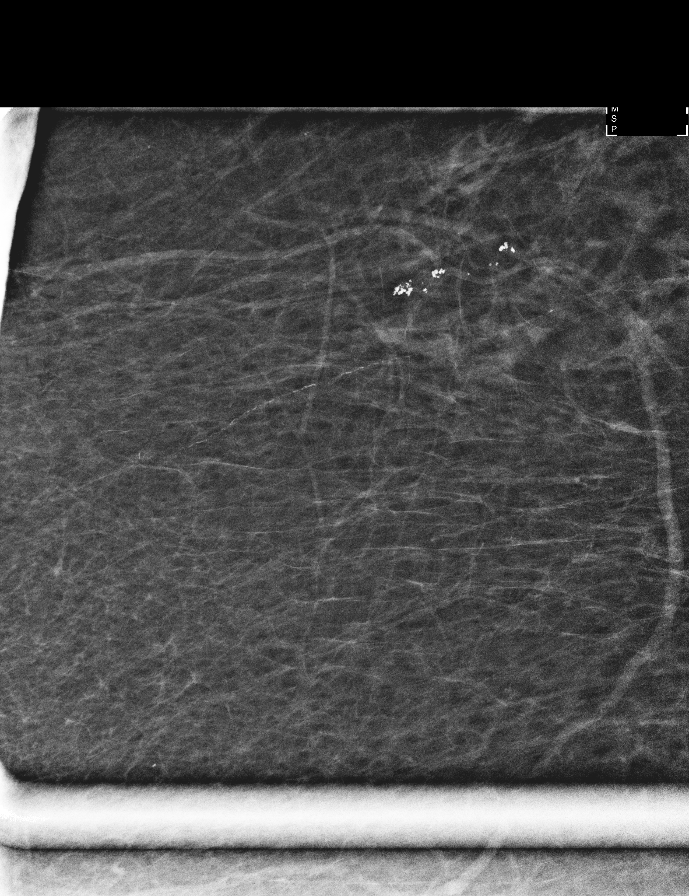

[R ML (1 of 3)]
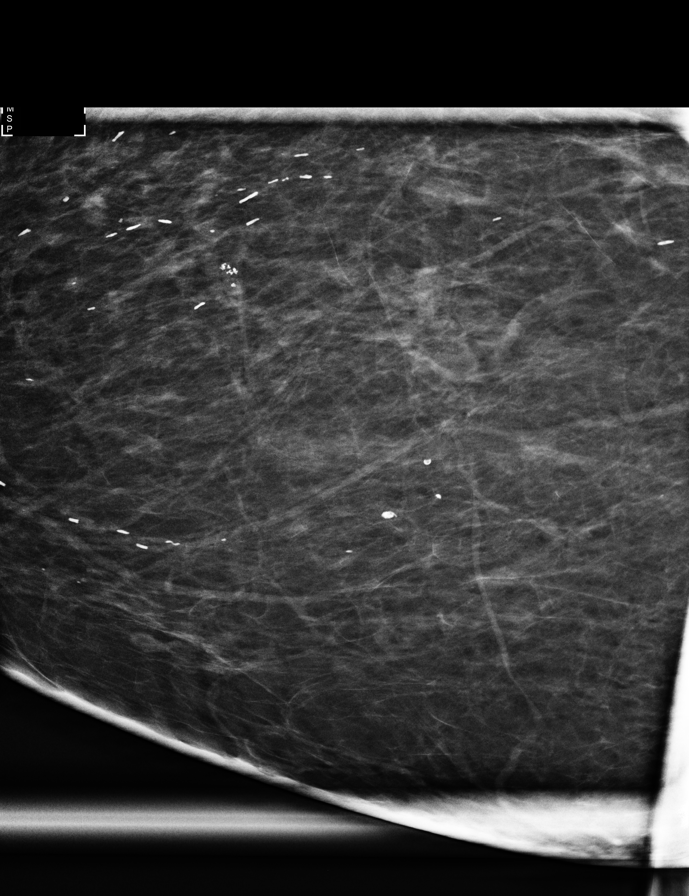

[R ML (2 of 3)]
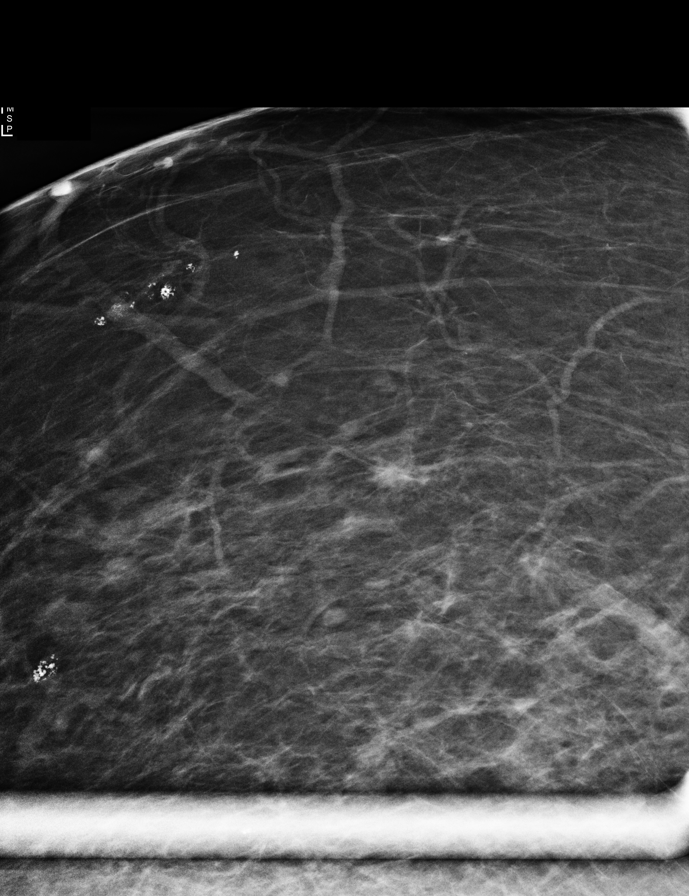

[R CC (1 of 3)]
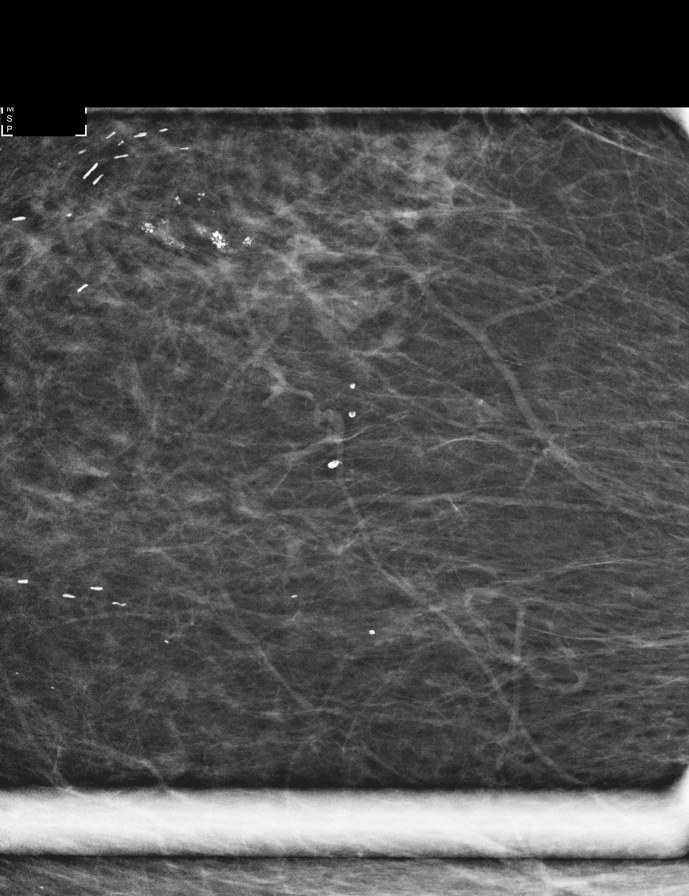

[R CC (2 of 3)]
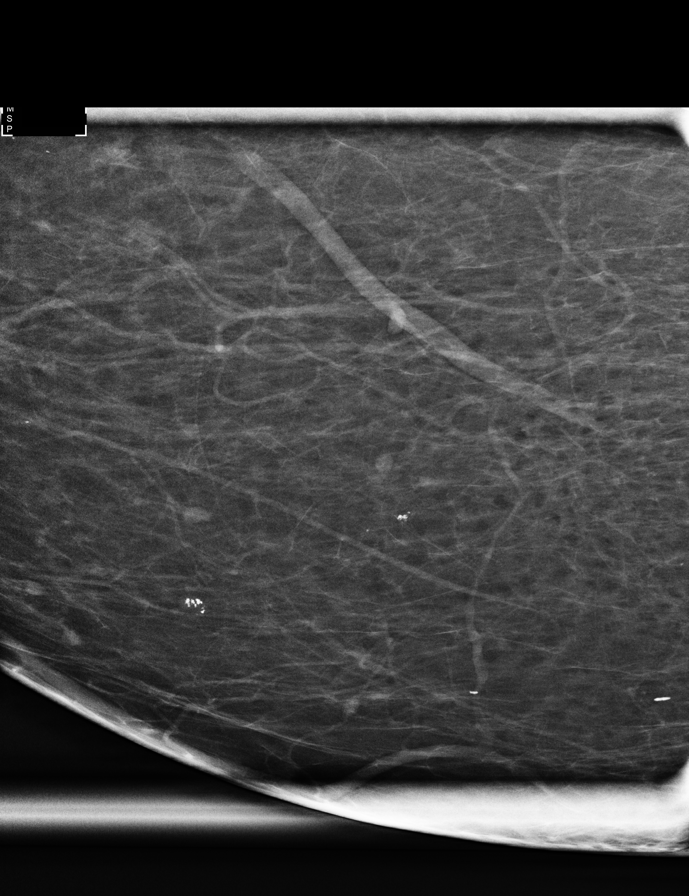

[L ML]
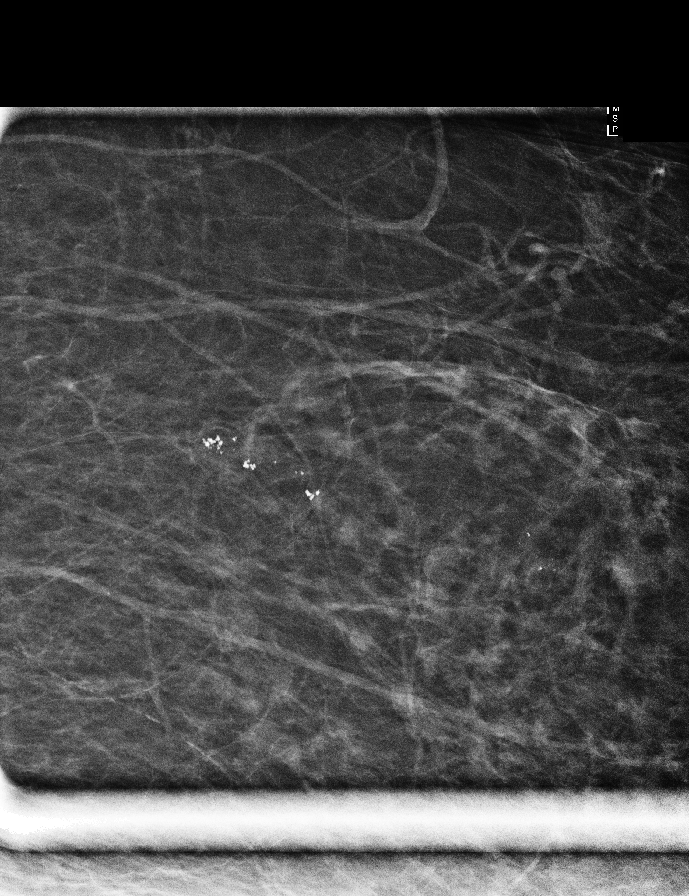

[R ML (3 of 3)]
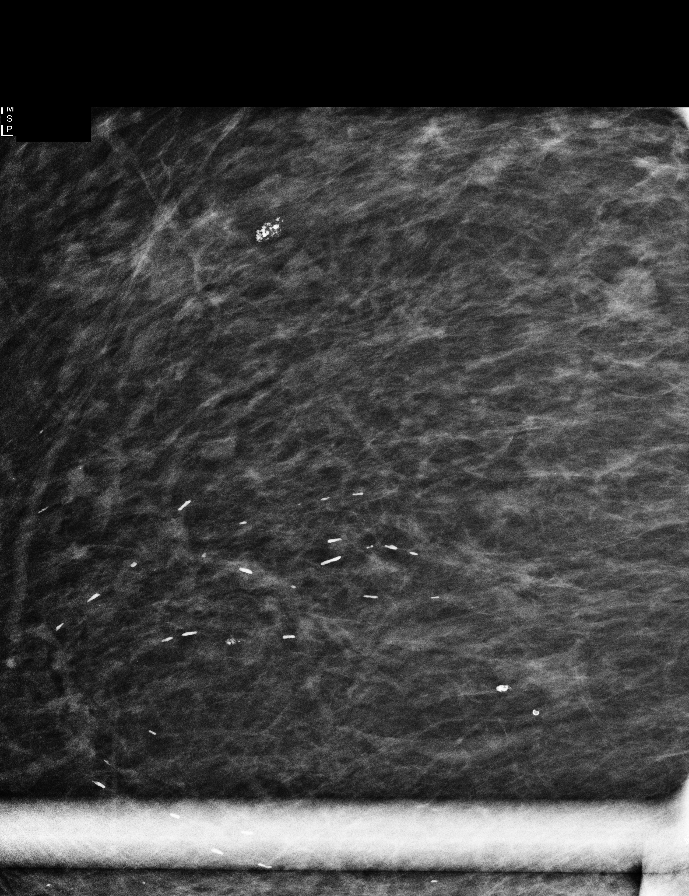

[R CC (3 of 3)]
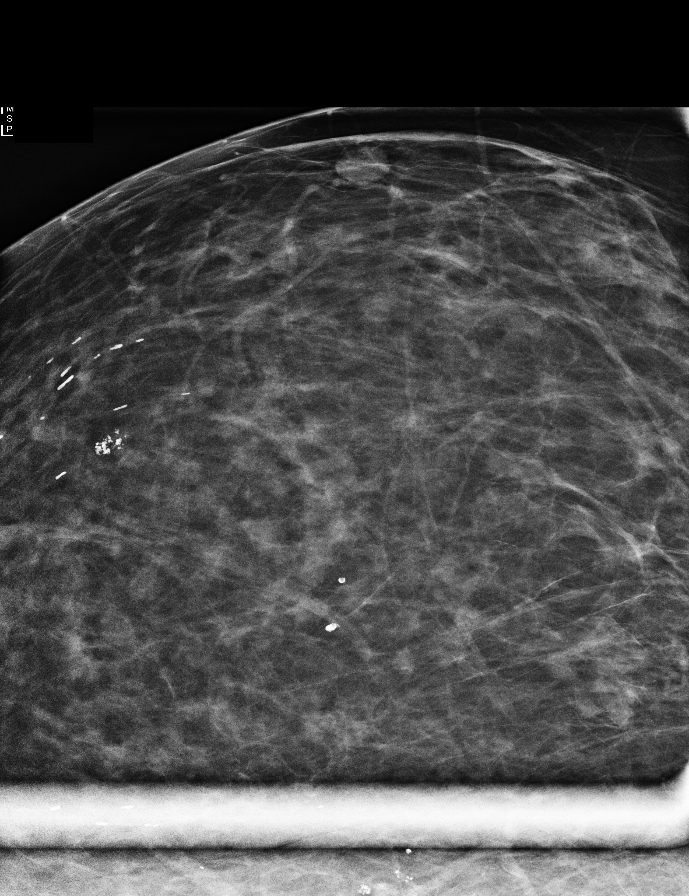

[8 of 32 positions shown; findings below may reference images not displayed]

ACR Breast Density Category b: There are scattered areas of
fibroglandular density.
FINDINGS: No suspicious mass, nonsurgical architectural distortion, or
suspicious microcalcification is identified in either breast.
Magnification views of bilateral breast calcifications show benign
coarse dystrophic calcifications in the upper outer breasts
bilaterally, and in the medial right breast. These have imaging
features most consistent with calcifying
fibroadenoma/fibroadenomatoid change. No suspicious
microcalcifications identified in either breast.

Mammographic images were processed with CAD.
IMPRESSION: No evidence of malignancy in either breast. Stable benign appearing
bilateral breast calcifications.

RECOMMENDATION:
Screening mammogram in one year.(Code:[PC])

I have discussed the findings and recommendations with the patient.
Results were also provided in writing at the conclusion of the
visit. If applicable, a reminder letter will be sent to the patient
regarding the next appointment.

BI-RADS CATEGORY  2: Benign.

## 2018-08-24 ENCOUNTER — Ambulatory Visit: Payer: Medicaid Other | Admitting: Gastroenterology

## 2018-09-04 ENCOUNTER — Ambulatory Visit (INDEPENDENT_AMBULATORY_CARE_PROVIDER_SITE_OTHER): Payer: Medicare Other | Admitting: Licensed Clinical Social Worker

## 2018-09-04 DIAGNOSIS — F3181 Bipolar II disorder: Secondary | ICD-10-CM | POA: Diagnosis not present

## 2018-09-05 ENCOUNTER — Encounter (HOSPITAL_COMMUNITY): Payer: Self-pay | Admitting: Licensed Clinical Social Worker

## 2018-09-05 NOTE — Progress Notes (Signed)
   THERAPIST PROGRESS NOTE  Session Time: 1:00 pm-1:40 om  Participation Level: Active  Behavioral Response: CasualAlertEuthymic  Type of Therapy: Individual Therapy  Treatment Goals addressed: Coping  Interventions: CBT and Solution Focused  Summary: Claudia Keller is a 67 y.o. female who presents oriented x5 (person, place, situation, time, and object), casually dressed, appropriately groomed, average height, average weight, and cooperative to address mood. Patient has a history of medical treatment including IBS, hypertension and hypothyroidism. Patient has a history of mental health treatment including outpatient therapy, medication management, and hospitalization. Patient denies suicidal and homicidal ideations.  Patient denies psychosis including auditory and visual hallucinations. Patient denies substance abuse. Patient is at low risk for lethality.  Physically: Patient's AC1 is on target. Her sleep is good.  Spiritually/values: Patient is doing well spiritually. She listens to pastors on tv.  Relationships: Patient's relationships are going well. She is getting along with her son and has a close relationship with her grandchildren.  Emotionally/Mentally/Behaviorally:  Patient's mood has been stable. She is starting to feel symptoms of depression start. Patient noted that she struggles with seasonal affective disorder. After discussion, patient understood that she needs to stay engaged in daily activities and leave the house from time to time as the weather gets cooler.   Patient engaged in session. She responded well to interventions. Patient continues to meet criteria for Bipolar II. Patient will continue in outpatient therapy due to being the least restrictive service to meet her needs. Patient made minimal progress on her goals.    Suicidal/Homicidal: Negativewithout intent/plan  Therapist Response: Therapist reviewed patient's recent thoughts and behavior. Therapist utilized CBT  to address mood. Therapist processed patient's feelings to identify triggers for mood. Therapist assisted patient in identifying ways to manage mood during winter months.   Plan: Return again in 4 weeks.  Diagnosis: Axis I: Bipolar, Depressed    Axis II: No diagnosis    Glori Bickers, LCSW 09/05/2018

## 2018-10-03 ENCOUNTER — Ambulatory Visit (INDEPENDENT_AMBULATORY_CARE_PROVIDER_SITE_OTHER): Payer: Medicare Other | Admitting: Licensed Clinical Social Worker

## 2018-10-03 ENCOUNTER — Encounter (HOSPITAL_COMMUNITY): Payer: Self-pay | Admitting: Licensed Clinical Social Worker

## 2018-10-03 DIAGNOSIS — F3181 Bipolar II disorder: Secondary | ICD-10-CM | POA: Diagnosis not present

## 2018-10-03 NOTE — Progress Notes (Signed)
   THERAPIST PROGRESS NOTE  Session Time: 9:00 am-9:40 om  Participation Level: Active  Behavioral Response: CasualAlertEuthymic  Type of Therapy: Individual Therapy  Treatment Goals addressed: Coping  Interventions: CBT and Solution Focused  Summary: Claudia Keller is a 67 y.o. female who presents oriented x5 (person, place, situation, time, and object), casually dressed, appropriately groomed, average height, average weight, and cooperative to address mood. Patient has a history of medical treatment including IBS, hypertension and hypothyroidism. Patient has a history of mental health treatment including outpatient therapy, medication management, and hospitalization. Patient denies suicidal and homicidal ideations.  Patient denies psychosis including auditory and visual hallucinations. Patient denies substance abuse. Patient is at low risk for lethality.  Physically: Patient is doing well physically. She feels good but noted that she has to sit down after she has stood for too long. Patient is trying to get a walker that has a seat on it.  Spiritually/values: Patient is doing well spiritually. She noted "The Reita Cliche is with me." Her spiritual faith is very important to her.  Relationships: Patient's relationships are going well. Patient is going to spend Thanksgiving with her ex and spending time with his family. Patient is excited to spend time with her grandchildren during the holidays.  Emotionally/Mentally/Behaviorally:  Patient's mood has been stable. She is managing her mood and feels happy about spending time with her grandchildren.   Patient engaged in session. She responded well to interventions. Patient continues to meet criteria for Bipolar II. Patient will continue in outpatient therapy due to being the least restrictive service to meet her needs. Patient made minimal progress on her goals.    Suicidal/Homicidal: Negativewithout intent/plan  Therapist Response: Therapist reviewed  patient's recent thoughts and behavior. Therapist utilized CBT to address mood. Therapist processed patient's feelings to identify triggers for mood. Therapist discussed with patient the need to remain active.   Plan: Return again in 4 weeks.  Diagnosis: Axis I: Bipolar, Depressed    Axis II: No diagnosis    Glori Bickers, LCSW 10/03/2018

## 2018-10-17 ENCOUNTER — Encounter: Payer: Self-pay | Admitting: Nurse Practitioner

## 2018-10-17 ENCOUNTER — Ambulatory Visit (INDEPENDENT_AMBULATORY_CARE_PROVIDER_SITE_OTHER): Payer: Medicare Other | Admitting: Nurse Practitioner

## 2018-10-17 VITALS — BP 157/79 | HR 96 | Temp 97.1°F | Ht 66.0 in | Wt 196.8 lb

## 2018-10-17 DIAGNOSIS — Z86010 Personal history of colonic polyps: Secondary | ICD-10-CM

## 2018-10-17 DIAGNOSIS — K580 Irritable bowel syndrome with diarrhea: Secondary | ICD-10-CM | POA: Diagnosis not present

## 2018-10-17 DIAGNOSIS — R112 Nausea with vomiting, unspecified: Secondary | ICD-10-CM | POA: Diagnosis not present

## 2018-10-17 MED ORDER — ONDANSETRON HCL 4 MG PO TABS: 4.0000 mg | ORAL_TABLET | Freq: Three times a day (TID) | ORAL | 2 refills | Status: DC | PRN

## 2018-10-17 MED ORDER — DICYCLOMINE HCL 10 MG PO CAPS: 10.0000 mg | ORAL_CAPSULE | Freq: Four times a day (QID) | ORAL | 1 refills | Status: DC | PRN

## 2018-10-17 NOTE — Patient Instructions (Signed)
1. I have sent a prescription for Zofran to your pharmacy.  You can take this every 8 hours, as needed.  Do not take more than 3 doses in a day. 2. I have sent dicyclomine (Bentyl) 10 mg to your pharmacy.  You can use this up to 4 times a day as needed for abdominal cramping and diarrhea. 3. You can continue to use Imodium as needed for diarrhea as well. 4. Return for follow-up in 4 months. 5. Call us if you have any questions or concerns. 6. You will be due for your next colonoscopy in April 2021.  At [facility] we value your feedback. You may receive a survey about your visit today. Please share your experience as we strive to create trusting relationships with our patients to provide genuine, compassionate, quality care.  We appreciate your understanding and patience as we review any laboratory studies, imaging, and other diagnostic tests that are ordered as we care for you. Our office policy is 5 business days for review of these results, and any emergent or urgent results are addressed in a timely manner for your best interest. If you do not hear from our office in 1 week, please contact us.   We also encourage the use of MyChart, which contains your medical information for your review as well. If you are not enrolled in this feature, an access code is on this after visit summary for your convenience. Thank you for allowing [place] to be involved in your care.  It was great to see you today!  I hope you have a Merry Christmas!!

## 2018-10-17 NOTE — Assessment & Plan Note (Signed)
History of multiple colon polyp.  Recommended repeat early interval at her last colonoscopy.  Discussed this with the patient.  She will be due in about a year and a half.  She is on recall.  Call with any questions.

## 2018-10-17 NOTE — Progress Notes (Signed)
Referring Provider: Rosita Fire, MD Primary Care Physician:  Rosita Fire, MD Primary GI:  Dr. Gala Romney  Chief Complaint  Patient presents with  . Nausea    w/ some vomiting  . Diarrhea    QOD  . Abdominal Pain    "cramps at times"    HPI:   Claudia Keller is a 67 y.o. female who presents for follow-up on IBS.  The patient was last seen in our office 03/29/2017 for the same as well as non-intractable nausea and vomiting.  Noted history of adenomas with last TCS in 02/2015 and due for surveillance in 2021.  Previously negative segmental colon biopsies for chronic diarrhea.  Was on Viberzi but this was discontinued due to status post cholecystectomy due to black box warning.  History of suicidal ideation and behavioral medicine treatment.  At her last visit with [place] she was feeling better, sleeping well.  No longer vomiting and Protonix seemed to help that.  Takes Imodium as needed for diarrhea but does not need routinely.  She felt Remeron was helping her symptoms.  No other GI symptoms.  Recommended continue Imodium, placed on recall for colonoscopy in 02/2020.  Follow-up in 1 year, sooner if needed.  Today she states she's doing ok overall. Symptoms returned somewhat. Has diarrhea about every other day associated with abdominal cramping; cramping relieved with defecation. Denies hematochezia, melena, fever, chills, unintentional weight loss. Has intermittent nausea and vomiting. Denies GERD symptoms, is on Protonix. Denies early satiety. Denies chest pain, dyspnea, dizziness, lightheadedness, syncope, near syncope. Denies any other upper or lower GI symptoms.  Recently her CBGs in the 160-190 range in the morning. Last saw PCP 3 months ago and no changes made to DM meds at that time.  Past Medical History:  Diagnosis Date  . Arthritis   . COPD (chronic obstructive pulmonary disease) ([place])   . Diverticulosis   . Essential hypertension, benign   . Hx of colonic polyps   .  Hypothyroidism   . IBS (irritable bowel syndrome)   . Irritable bowel syndrome   . Ketoacidosis   . Mixed hyperlipidemia   . PTSD (post-traumatic stress disorder)   . S/P colonoscopy Jan 2011   RMR: pancolonic diverticula, polypoid rectal mucosa with  prominent lymphoid aggregates, repeat in Jan 2016   . Shortness of breath dyspnea   . Stress incontinence, female   . Thyroid disease   . Tobacco use   . Type 2 diabetes mellitus ([place])     Past Surgical History:  Procedure Laterality Date  . Bilateral tubal ligation    . BREAST BIOPSY Left 10/16/2014   negative  . CHOLECYSTECTOMY  09/11/2012   Procedure: LAPAROSCOPIC CHOLECYSTECTOMY;  Surgeon: Jamesetta So, MD;  Location: [facility];  Service: General;  Laterality: N/A;  . COLONOSCOPY  11/17/2009   Dr. Gala Romney: normal rectum, pancolonic diverticula, polyps benign, no adenomas.   . COLONOSCOPY N/A 02/12/2015   Dr. Gala Romney: colonic diverticulosis, multiple colonic polyps (tubular adenomas), negative segmental biopsies. Surveillance in 2021  . ESOPHAGOGASTRODUODENOSCOPY  11/23/2011   Dr. Gala Romney: Erosive reflux esophagitis; small hiatal hernia; esophagus dilated empirically  . MALONEY DILATION  11/23/2011   Procedure: Venia Minks DILATION;  Surgeon: Daneil Dolin, MD;  Location: [facility];  Service: Endoscopy;  Laterality: N/A;  . SKIN LESION EXCISION     on buttocks  . TUBAL LIGATION      Current Outpatient Medications  Medication Sig Dispense Refill  . amLODipine (NORVASC) 10 MG  tablet Take 10 mg by mouth daily.     . Calcium 600-200 MG-UNIT tablet Take 1 tablet by mouth daily.    . cetirizine (ZYRTEC) 10 MG tablet Take 10 mg by mouth daily.     . divalproex (DEPAKOTE) 250 MG DR tablet Take 250 mg by mouth every 8 (eight) hours.    . furosemide (LASIX) 20 MG tablet Take 20 mg by mouth daily.     Marland Kitchen gabapentin (NEURONTIN) 100 MG capsule Take 100 mg by mouth 3 (three) times daily.     . insulin glargine (LANTUS) 100 UNIT/ML injection Inject  6,080 Units into the skin at bedtime.     . insulin lispro (HUMALOG) 100 UNIT/ML injection Inject 15-24 Units into the skin 3 (three) times daily with meals. Per sliding scale. 151-200=18 units; 201-250= 9 units; 251-300=20 units; 301-350=21 units; 351-400= 22 units; 401-450=23 units; 500-551=24 units.    Marland Kitchen levothyroxine (SYNTHROID, LEVOTHROID) 150 MCG tablet Take 150 mcg by mouth daily before breakfast.    . linagliptin (TRADJENTA) 5 MG TABS tablet Take 5 mg by mouth daily.     Marland Kitchen lisinopril (PRINIVIL,ZESTRIL) 20 MG tablet Take 20 mg by mouth daily.    Marland Kitchen loperamide (IMODIUM) 2 MG capsule Take by mouth as needed for diarrhea or loose stools.    . metFORMIN (GLUCOPHAGE) 500 MG tablet Take 500 mg by mouth 3 (three) times daily.     . mirtazapine (REMERON SOL-TAB) 15 MG disintegrating tablet Take 15 mg by mouth at bedtime.     . naproxen sodium (ALEVE) 220 MG tablet Take 220-440 mg by mouth daily as needed (for pain).    . pantoprazole (PROTONIX) 40 MG tablet Take 1 tablet (40 mg total) by mouth daily before breakfast. 90 tablet 3  . PARoxetine (PAXIL) 40 MG tablet Take 40 mg by mouth daily.     . QUEtiapine (SEROQUEL) 50 MG tablet Take 100 mg by mouth at bedtime.     . simvastatin (ZOCOR) 40 MG tablet Take 40 mg by mouth at bedtime.     Marland Kitchen aspirin EC 81 MG tablet Take 81 mg by mouth daily.     No current facility-administered medications for this visit.     Allergies as of 10/17/2018  . (No Known Allergies)    Family History  Problem Relation Age of Onset  . Schizophrenia Brother   . Stroke Brother   . Cancer Mother        unsure what type  . Heart disease Father   . Heart attack Father   . Coronary artery disease Unknown   . Diabetes type II Unknown   . Cancer Son        bladder  . Colon cancer Neg Hx     Social History   Socioeconomic History  . Marital status: Legally Separated    Spouse name: Not on file  . Number of children: Not on file  . Years of education: Not on file    . Highest education level: Not on file  Occupational History  . Not on file  Social Needs  . Financial resource strain: Not on file  . Food insecurity:    Worry: Not on file    Inability: Not on file  . Transportation needs:    Medical: Not on file    Non-medical: Not on file  Tobacco Use  . Smoking status: Current Every Day Smoker    Packs/day: 2.00    Years: 43.00    Pack years:  86.00    Types: Cigarettes  . Smokeless tobacco: Never Used  Substance and Sexual Activity  . Alcohol use: No    Alcohol/week: 0.0 standard drinks  . Drug use: No  . Sexual activity: Not Currently    Birth control/protection: Surgical, Post-menopausal    Comment: tubal  Lifestyle  . Physical activity:    Days per week: Not on file    Minutes per session: Not on file  . Stress: Not on file  Relationships  . Social connections:    Talks on phone: Not on file    Gets together: Not on file    Attends religious service: Not on file    Active member of club or organization: Not on file    Attends meetings of clubs or organizations: Not on file    Relationship status: Not on file  Other Topics Concern  . Not on file  Social History Narrative  . Not on file    Review of Systems: General: Negative for anorexia, weight loss, fever, chills, fatigue, weakness. ENT: Negative for hoarseness, difficulty swallowing. CV: Negative for chest pain, angina, palpitations, peripheral edema.  Respiratory: Negative for dyspnea at rest, cough, sputum, wheezing.  GI: See history of present illness. Endo: Negative for unusual weight change.  Heme: Negative for bruising or bleeding.   Physical Exam: BP (!) 157/79   Pulse 96   Temp (!) 97.1 F (36.2 C) (Oral)   Ht 5\' 6"  (1.676 m)   Wt 196 lb 12.8 oz (89.3 kg)   BMI 31.76 kg/m  General:   Alert and oriented. Pleasant and cooperative. Well-nourished and well-developed.  Eyes:  Without icterus, sclera clear and conjunctiva pink.  Ears:  Normal auditory  acuity. Cardiovascular:  S1, S2 present without murmurs appreciated. Extremities without clubbing or edema. Respiratory:  Diminished bilaterally. No wheezes, rales, or rhonchi. No distress.  Gastrointestinal:  +BS, soft, non-tender and non-distended. No HSM noted. No guarding or rebound. No masses appreciated.  Rectal:  Deferred  Musculoskalatal:  Symmetrical without gross deformities. Neurologic:  Alert and oriented x4;  grossly normal neurologically. Psych:  Alert and cooperative. Normal mood and affect. Heme/Lymph/Immune: No excessive bruising noted.    10/17/2018 9:28 AM   Disclaimer: This note was dictated with voice recognition software. Similar sounding words can inadvertently be transcribed and may not be corrected upon review.

## 2018-10-17 NOTE — Assessment & Plan Note (Signed)
Persistent nausea and vomiting.  She is on multiple medications and cannot rule out medication effect.  She is on a PPI and her GERD symptoms are well controlled.  Possible relation/correlation to mildly exacerbated IBS.  Further treatment of IBS as per below.  I will send in Zofran 4 mg to her pharmacy that she can take as needed for nausea and vomiting.

## 2018-10-17 NOTE — Assessment & Plan Note (Signed)
When we last saw her in 2018 she was doing well.  She has had some recurrent diarrhea about every other day which is associated with abdominal cramping.  Her cramping generally resolves with her bowel movement.  She takes Imodium as needed.  Given the fact that she is having abdominal cramping as well at this time, I will start her on Bentyl 10 mg 4 times a day as needed.  She can continue Imodium as well, as needed.  Follow-up in 4 months.  Call if any problems.

## 2018-10-17 NOTE — Progress Notes (Signed)
cc'ed to pcp °

## 2018-11-14 ENCOUNTER — Encounter (HOSPITAL_COMMUNITY): Payer: Self-pay | Admitting: Licensed Clinical Social Worker

## 2018-11-14 ENCOUNTER — Ambulatory Visit (INDEPENDENT_AMBULATORY_CARE_PROVIDER_SITE_OTHER): Payer: Medicare Other | Admitting: Licensed Clinical Social Worker

## 2018-11-14 DIAGNOSIS — F3181 Bipolar II disorder: Secondary | ICD-10-CM | POA: Diagnosis not present

## 2018-11-14 NOTE — Progress Notes (Signed)
   THERAPIST PROGRESS NOTE  Session Time: 1:00 pm-1:40 pm  Participation Level: Active  Behavioral Response: CasualAlertEuthymic  Type of Therapy: Individual Therapy  Treatment Goals addressed: Coping  Interventions: CBT and Solution Focused  Summary: Claudia Keller is a 68 y.o. female who presents oriented x5 (person, place, situation, time, and object), casually dressed, appropriately groomed, average height, average weight, and cooperative to address mood. Patient has a history of medical treatment including IBS, hypertension and hypothyroidism. Patient has a history of mental health treatment including outpatient therapy, medication management, and hospitalization. Patient denies suicidal and homicidal ideations.  Patient denies psychosis including auditory and visual hallucinations. Patient denies substance abuse. Patient is at low risk for lethality.  Physically: Patient is doing well physically. She has IBS and incontinence but has accepted it. She is managing her health and has not gone to the hospital in a year and plans to stay out of the hospital.  Spiritually/values: Patient is doing well spiritually.  Relationships: Patient's relationships with her family are going well. She spent Christmas with her grandchildren. Patient is very proud of her grandchildren.  Emotionally/Mentally/Behaviorally:  Patient's mood has been stable. She feels happy and has been doing the small things she needs to do such as get out of the house on a regular basis. Patient plans to stay out of the hospital for both medical and mental health reason for another year.    Patient engaged in session. She responded well to interventions. Patient continues to meet criteria for Bipolar II. Patient will continue in outpatient therapy due to being the least restrictive service to meet her needs. Patient made minimal progress on her goals.    Suicidal/Homicidal: Negativewithout intent/plan  Therapist Response:  Therapist reviewed patient's recent thoughts and behavior. Therapist utilized CBT to address mood. Therapist processed patient's feelings to identify triggers for mood. Therapist discussed with patient's desire and plan to stay out of the hospital for medical or mental health reasons.    Plan: Return again in 4 weeks.  Diagnosis: Axis I: Bipolar, Depressed    Axis II: No diagnosis    Glori Bickers, LCSW 11/14/2018

## 2018-11-17 ENCOUNTER — Ambulatory Visit (HOSPITAL_COMMUNITY): Payer: Medicaid Other | Admitting: Psychiatry

## 2018-11-24 DIAGNOSIS — E1165 Type 2 diabetes mellitus with hyperglycemia: Secondary | ICD-10-CM | POA: Diagnosis not present

## 2018-11-24 DIAGNOSIS — J449 Chronic obstructive pulmonary disease, unspecified: Secondary | ICD-10-CM | POA: Diagnosis not present

## 2018-11-24 DIAGNOSIS — K580 Irritable bowel syndrome with diarrhea: Secondary | ICD-10-CM | POA: Diagnosis not present

## 2018-11-24 DIAGNOSIS — I10 Essential (primary) hypertension: Secondary | ICD-10-CM | POA: Diagnosis not present

## 2018-12-05 ENCOUNTER — Ambulatory Visit (HOSPITAL_COMMUNITY): Payer: Medicare Other | Admitting: Licensed Clinical Social Worker

## 2018-12-06 ENCOUNTER — Ambulatory Visit: Payer: Self-pay | Admitting: Gastroenterology
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
## 2018-12-14 ENCOUNTER — Ambulatory Visit (HOSPITAL_COMMUNITY): Payer: Medicare Other | Admitting: Psychiatry
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
## 2018-12-21 ENCOUNTER — Ambulatory Visit (HOSPITAL_COMMUNITY): Payer: Medicare Other | Admitting: Psychiatry
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
## 2018-12-27 ENCOUNTER — Encounter: Payer: Self-pay | Admitting: Nurse Practitioner

## 2018-12-28 ENCOUNTER — Ambulatory Visit (HOSPITAL_COMMUNITY): Payer: Medicare Other | Admitting: Psychiatry
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
## 2019-01-15 ENCOUNTER — Ambulatory Visit (INDEPENDENT_AMBULATORY_CARE_PROVIDER_SITE_OTHER): Payer: Medicare Other | Admitting: Psychiatry

## 2019-01-15 ENCOUNTER — Encounter (HOSPITAL_COMMUNITY): Payer: Self-pay | Admitting: Psychiatry

## 2019-01-15 ENCOUNTER — Telehealth (HOSPITAL_COMMUNITY): Payer: Self-pay | Admitting: Psychiatry

## 2019-01-15 VITALS — BP 139/72 | HR 88 | Ht 66.0 in | Wt 200.0 lb

## 2019-01-15 DIAGNOSIS — F3181 Bipolar II disorder: Secondary | ICD-10-CM | POA: Diagnosis not present

## 2019-01-15 DIAGNOSIS — F4310 Post-traumatic stress disorder, unspecified: Secondary | ICD-10-CM | POA: Diagnosis not present

## 2019-01-15 MED ORDER — QUETIAPINE FUMARATE 100 MG PO TABS: 100.0000 mg | ORAL_TABLET | Freq: Every day | ORAL | 2 refills | Status: DC

## 2019-01-15 NOTE — Addendum Note (Signed)
Addended by: Norman Clay on: 01/15/2019 10:41 AM   Modules accepted: Orders

## 2019-01-15 NOTE — Addendum Note (Signed)
Addended by: Norman Clay on: 01/15/2019 10:43 AM   Modules accepted: Orders

## 2019-01-15 NOTE — Patient Instructions (Addendum)
1. Continue Paxil 40 mg daily  2. Continue mirtazapine 7.5 mg at night 3. Continue Depakote 250 mg in AM, 1000mg  at night 4. Decrease quetiapine; will update you 7. Return to clinic in three months for 15 mins - Discuss with your primary care doctor regarding the recent falls

## 2019-01-15 NOTE — Telephone Encounter (Signed)
Could you contact  The pharmacy to discontinue quetiapine order/refill of quetiapine from DR. Fanta Tesfaye (I placed a new order of 100 mg at night) The patient to notify the medication change: decrease quetiapine to 100 mg at night (1 tab per night)   Thanks!

## 2019-01-16 ENCOUNTER — Encounter (HOSPITAL_COMMUNITY): Payer: Self-pay | Admitting: Psychiatry

## 2019-01-16 LAB — COMPREHENSIVE METABOLIC PANEL
AG Ratio: 1.8 (calc) (ref 1.0–2.5)
ALT: 14 U/L (ref 6–29)
AST: 15 U/L (ref 10–35)
Albumin: 4 g/dL (ref 3.6–5.1)
Alkaline phosphatase (APISO): 63 U/L (ref 37–153)
BUN/Creatinine Ratio: 19 (calc) (ref 6–22)
BUN: 24 mg/dL (ref 7–25)
CO2: 27 mmol/L (ref 20–32)
Calcium: 9.6 mg/dL (ref 8.6–10.4)
Chloride: 99 mmol/L (ref 98–110)
Creat: 1.24 mg/dL — ABNORMAL HIGH (ref 0.50–0.99)
Globulin: 2.2 g/dL (calc) (ref 1.9–3.7)
Glucose, Bld: 245 mg/dL — ABNORMAL HIGH (ref 65–139)
Potassium: 4.8 mmol/L (ref 3.5–5.3)
Sodium: 135 mmol/L (ref 135–146)
Total Bilirubin: 0.4 mg/dL (ref 0.2–1.2)
Total Protein: 6.2 g/dL (ref 6.1–8.1)

## 2019-01-16 LAB — CBC
HCT: 42.7 % (ref 35.0–45.0)
Hemoglobin: 14.5 g/dL (ref 11.7–15.5)
MCH: 32.3 pg (ref 27.0–33.0)
MCHC: 34 g/dL (ref 32.0–36.0)
MCV: 95.1 fL (ref 80.0–100.0)
MPV: 14 fL — ABNORMAL HIGH (ref 7.5–12.5)
Platelets: 156 10*3/uL (ref 140–400)
RBC: 4.49 10*6/uL (ref 3.80–5.10)
RDW: 13.8 % (ref 11.0–15.0)
WBC: 7.1 10*3/uL (ref 3.8–10.8)

## 2019-01-16 LAB — VALPROIC ACID LEVEL: Valproic Acid Lvl: 87.9 mg/L (ref 50.0–100.0)

## 2019-02-13 ENCOUNTER — Other Ambulatory Visit: Payer: Self-pay

## 2019-02-13 ENCOUNTER — Ambulatory Visit (INDEPENDENT_AMBULATORY_CARE_PROVIDER_SITE_OTHER): Payer: Medicare Other | Admitting: Licensed Clinical Social Worker

## 2019-02-13 ENCOUNTER — Encounter (HOSPITAL_COMMUNITY): Payer: Self-pay | Admitting: Licensed Clinical Social Worker

## 2019-02-13 DIAGNOSIS — F3181 Bipolar II disorder: Secondary | ICD-10-CM | POA: Diagnosis not present

## 2019-02-13 NOTE — Progress Notes (Signed)
Virtual Visit via Telephone Note  I connected with Claudia Keller on 02/13/19 at  1:00 PM EDT by telephone and verified that I am speaking with the correct person using two identifiers.   I discussed the limitations, risks, security and privacy concerns of performing an evaluation and management service by telephone and the availability of in person appointments. I also discussed with the patient that there may be a patient responsible charge related to this service. The patient expressed understanding and agreed to proceed.   History of Present Illness: Claudia Keller presents oriented x5 (person, place, situation, time, and object), casually dressed, appropriately groomed, average height, average weight, and cooperative to address mood. Patient has a history of medical treatment including IBS, hypertension and hypothyroidism. Patient has a history of mental health treatment including outpatient therapy, medication management, and hospitalization. Patient denies suicidal and homicidal ideations.  Patient denies psychosis including auditory and visual hallucinations. Patient denies substance abuse. Patient is at low risk for lethality.   Observations/Objective: Physically: Patient is having hip pain and a possible UTI. Patient's sleep is going well.  Spiritually/values: Patient is doing well spiritually.  Relationships: Patient is reaching out to her family by phone and staying connected.  Emotionally/Mentally/Behaviorally:  Patient's mood has been stable. She has had moments of depression but overall feels good. Patient is keeping herself occupied and reducing leaving the home. She connected how she feels physically to her mood.   Patient engaged in session. She responded well to interventions. Patient continues to meet criteria for Bipolar II. Patient will continue in outpatient therapy due to being the least restrictive service to meet her needs. Patient made minimal progress on her goals.  Assessment  and Plan: Therapist reviewed patient's recent thoughts and behavior. Therapist utilized CBT to address mood. Therapist processed patient's feelings to identify triggers for mood. Therapist discussed with patient how she is coping with [place].   Suicidal/Homicidal: Negativewithout intent/plan  Follow Up Instructions: Plan: Return again in 4 weeks.   I discussed the assessment and treatment plan with the patient. The patient was provided an opportunity to ask questions and all were answered. The patient agreed with the plan and demonstrated an understanding of the instructions.   The patient was advised to call back or seek an in-person evaluation if the symptoms worsen or if the condition fails to improve as anticipated.  I provided 30 minutes of non-face-to-face time during this encounter.   Glori Bickers, LCSW

## 2019-02-19 ENCOUNTER — Other Ambulatory Visit: Payer: Self-pay

## 2019-02-19 ENCOUNTER — Encounter: Payer: Self-pay | Admitting: Gastroenterology

## 2019-02-19 ENCOUNTER — Ambulatory Visit (INDEPENDENT_AMBULATORY_CARE_PROVIDER_SITE_OTHER): Payer: Medicare Other | Admitting: Gastroenterology

## 2019-02-19 ENCOUNTER — Encounter: Payer: Self-pay | Admitting: Internal Medicine

## 2019-02-19 DIAGNOSIS — R197 Diarrhea, unspecified: Secondary | ICD-10-CM | POA: Diagnosis not present

## 2019-02-19 MED ORDER — COLESTIPOL HCL 1 G PO TABS: 2.0000 g | ORAL_TABLET | Freq: Every day | ORAL | 5 refills | Status: DC

## 2019-02-19 NOTE — Patient Instructions (Signed)
1. Trial of Colestid for diarrhea.  Take 2 capsules once daily, do not take within 2 hours of other medications.  If you get constipated, hold for a day or 2. 2. Stop dicyclomine (Bentyl). 3. We will plan to see back in August but if you have any problems in the meantime, please do not hesitate to call.  We can adjust the Colestid as needed based on your response so if you feel like is not working adequately or too strong please let me know.

## 2019-02-19 NOTE — Progress Notes (Signed)
Primary Care Physician:  Rosita Fire, MD Primary GI:  Garfield Cornea, MD   Patient Location: Home  Provider Location: [facility] office   Reason for Phone Visit: Follow-up IBS  Persons present on the phone encounter, with roles: Patient, myself (provider), Charma Igo, CMA (updated meds and allergies)  Total time (minutes) spent on medical discussion: 20 minutes  Due to COVID-19, visit was conducted using telephonic method (no video was available).  Visit was requested by patient.  Virtual Visit via Telephone only  I connected with [name] on 02/19/19 at  9:30 AM EDT by telephone and verified that I am speaking with the correct person using two identifiers.   I discussed the limitations, risks, security and privacy concerns of performing an evaluation and management service by telephone and the availability of in person appointments. I also discussed with the patient that there may be a patient responsible charge related to this service. The patient expressed understanding and agreed to proceed.   HPI:   Claudia Keller is a 68 y.o. female who presents for telephone visit regarding IBS-D, nausea/vomiting.  She was last seen in December.  Colonoscopy in April 2016 with diverticulosis, multiple adenomas, segmental biopsies negative for microscopic colitis. Due for surveillance in 2021. EGD in 2013 with erosive reflux esophagitis, small hiatal hernia, esophagus dilated empirically.  At her last office visit she was placed on Bentyl.  Given Zofran for nausea/vomiting.  Patient states her only complaint is ongoing diarrhea.  Bentyl did not seem to help.  Imodium helps sometimes.  Was on Viberzi remotely but had to be stopped because she has had cholecystectomy.  Currently BM 6-7 times a day, some rare nocturnal stools. Some days will do better than others.  Feels like she cannot go anywhere because of urgency and intermittent fecal incontinence.  No melena, brbpr. Appetite good. Worse  diarrhea with dairy and greens. Prefers Almond Milk. Abdominal cramping relieved with BM.  No issues with nausea or vomiting, heartburn.  Current Outpatient Medications  Medication Sig Dispense Refill  . amLODipine (NORVASC) 10 MG tablet Take 10 mg by mouth daily.     Marland Kitchen aspirin EC 81 MG tablet Take 81 mg by mouth daily.    . Calcium 600-200 MG-UNIT tablet Take 1 tablet by mouth daily.    . cetirizine (ZYRTEC) 10 MG tablet Take 10 mg by mouth daily.     Marland Kitchen dicyclomine (BENTYL) 10 MG capsule Take 1 capsule (10 mg total) by mouth 4 (four) times daily as needed for spasms. 90 capsule 1  . divalproex (DEPAKOTE ER) 500 MG 24 hr tablet Take 500 mg by mouth every evening.    . divalproex (DEPAKOTE) 250 MG DR tablet Take 250 mg by mouth daily.     . furosemide (LASIX) 20 MG tablet Take 20 mg by mouth daily.     Marland Kitchen gabapentin (NEURONTIN) 100 MG capsule Take 100 mg by mouth 3 (three) times daily.     . insulin glargine (LANTUS) 100 UNIT/ML injection Inject 60 Units into the skin at bedtime.     . insulin lispro (HUMALOG) 100 UNIT/ML injection Inject 15-24 Units into the skin 3 (three) times daily with meals. Per sliding scale. 151-200=18 units; 201-250= 9 units; 251-300=20 units; 301-350=21 units; 351-400= 22 units; 401-450=23 units; 500-551=24 units.    Marland Kitchen levothyroxine (SYNTHROID, LEVOTHROID) 150 MCG tablet Take 150 mcg by mouth daily before breakfast.    . linagliptin (TRADJENTA) 5 MG TABS tablet Take 5 mg  by mouth 2 (two) times daily.     Marland Kitchen lisinopril (PRINIVIL,ZESTRIL) 20 MG tablet Take 20 mg by mouth daily.    Marland Kitchen loperamide (IMODIUM) 2 MG capsule Take by mouth as needed for diarrhea or loose stools.    . metFORMIN (GLUCOPHAGE) 500 MG tablet Take 500 mg by mouth 2 (two) times daily.     . mirtazapine (REMERON) 15 MG tablet Take 7.5 mg by mouth at bedtime.    . naproxen sodium (ALEVE) 220 MG tablet Take 220-440 mg by mouth daily as needed (for pain).    . ondansetron (ZOFRAN) 4 MG tablet Take 1 tablet (4  mg total) by mouth every 8 (eight) hours as needed for nausea or vomiting. 30 tablet 2  . pantoprazole (PROTONIX) 40 MG tablet Take 1 tablet (40 mg total) by mouth daily before breakfast. 90 tablet 3  . PARoxetine (PAXIL) 40 MG tablet Take 40 mg by mouth daily.     . QUEtiapine (SEROQUEL) 100 MG tablet Take 1 tablet (100 mg total) by mouth at bedtime. 30 tablet 2  . simvastatin (ZOCOR) 40 MG tablet Take 40 mg by mouth at bedtime.      No current facility-administered medications for this visit.     Past Medical History:  Diagnosis Date  . Arthritis   . COPD (chronic obstructive pulmonary disease) ([place])   . Diverticulosis   . Essential hypertension, benign   . Hx of colonic polyps   . Hypothyroidism   . IBS (irritable bowel syndrome)   . Irritable bowel syndrome   . Ketoacidosis   . Mixed hyperlipidemia   . PTSD (post-traumatic stress disorder)   . S/P colonoscopy Jan 2011   RMR: pancolonic diverticula, polypoid rectal mucosa with  prominent lymphoid aggregates, repeat in Jan 2016   . Shortness of breath dyspnea   . Stress incontinence, female   . Thyroid disease   . Tobacco use   . Type 2 diabetes mellitus ([place])     Past Surgical History:  Procedure Laterality Date  . Bilateral tubal ligation    . BREAST BIOPSY Left 10/16/2014   negative  . CHOLECYSTECTOMY  09/11/2012   Procedure: LAPAROSCOPIC CHOLECYSTECTOMY;  Surgeon: Jamesetta So, MD;  Location: [facility];  Service: General;  Laterality: N/A;  . COLONOSCOPY  11/17/2009   Dr. Gala Romney: normal rectum, pancolonic diverticula, polyps benign, no adenomas.   . COLONOSCOPY N/A 02/12/2015   Dr. Gala Romney: colonic diverticulosis, multiple colonic polyps (tubular adenomas), negative segmental biopsies. Surveillance in 2021  . ESOPHAGOGASTRODUODENOSCOPY  11/23/2011   Dr. Gala Romney: Erosive reflux esophagitis; small hiatal hernia; esophagus dilated empirically  . MALONEY DILATION  11/23/2011   Procedure: Venia Minks DILATION;  Surgeon: Daneil Dolin,  MD;  Location: [facility];  Service: Endoscopy;  Laterality: N/A;  . SKIN LESION EXCISION     on buttocks  . TUBAL LIGATION      ROS:  General: Negative for anorexia, weight loss, fever, chills, fatigue, weakness. Eyes: Negative for vision changes.  ENT: Negative for hoarseness, difficulty swallowing , nasal congestion GI: See history of present illness. GU:  Negative for dysuria, hematuria, urinary incontinence, urinary frequency, nocturnal urination.     Observations/Objective: Patient was pleasant on the phone.  Did not sound to be in any distress.  Otherwise exam not available.  Lab Results  Component Value Date   WBC 7.1 01/15/2019   HGB 14.5 01/15/2019   HCT 42.7 01/15/2019   MCV 95.1 01/15/2019   PLT 156 01/15/2019  Lab Results  Component Value Date   CREATININE 1.24 (H) 01/15/2019   BUN 24 01/15/2019   NA 135 01/15/2019   K 4.8 01/15/2019   CL 99 01/15/2019   CO2 27 01/15/2019   Lab Results  Component Value Date   ALT 14 01/15/2019   AST 15 01/15/2019   ALKPHOS 63 01/15/2019   BILITOT 0.4 01/15/2019     Assessment and Plan: Pleasant 68 year old female with history of chronic diarrhea.  Diagnosed with IBS-D years ago.  Gallbladder removed in 2013.  Previous celiac serologies negative in 2012.  Previous colonoscopy (2016) with random colon biopsies unremarkable.  Has not really benefited from dicyclomine.  Discussed options of another antispasmodic such as Levsin or Levbid versus covering for possible postcholecystectomy diarrhea.  We discussed Colestid, timing of medication not to be within 2 hours of other medications, risk of constipation.  She would like to try the medication.  We will have her come back in August for follow-up but in the interim she is to let us know if she has any problems.  Plans for surveillance colonoscopy April 2021.  Follow Up Instructions:    I discussed the assessment and treatment plan with the patient. The patient was  provided an opportunity to ask questions and all were answered. The patient agreed with the plan and demonstrated an understanding of the instructions. AVS mailed to patient's home address.   The patient was advised to call back or seek an in-person evaluation if the symptoms worsen or if the condition fails to improve as anticipated.  I provided 20 minutes of non-face-to-face time during this encounter.   Neil Crouch, PA-C

## 2019-02-19 NOTE — Progress Notes (Signed)
cc'ed to pcp °

## 2019-02-21 ENCOUNTER — Ambulatory Visit: Payer: Self-pay | Admitting: Nurse Practitioner

## 2019-02-21 DIAGNOSIS — E1165 Type 2 diabetes mellitus with hyperglycemia: Secondary | ICD-10-CM | POA: Diagnosis not present

## 2019-02-21 DIAGNOSIS — E039 Hypothyroidism, unspecified: Secondary | ICD-10-CM | POA: Diagnosis not present

## 2019-02-21 DIAGNOSIS — J449 Chronic obstructive pulmonary disease, unspecified: Secondary | ICD-10-CM | POA: Diagnosis not present

## 2019-02-22 ENCOUNTER — Ambulatory Visit: Payer: Self-pay | Admitting: Nurse Practitioner

## 2019-02-28 ENCOUNTER — Other Ambulatory Visit: Payer: Self-pay | Admitting: Internal Medicine

## 2019-03-08 DIAGNOSIS — R2689 Other abnormalities of gait and mobility: Secondary | ICD-10-CM | POA: Diagnosis not present

## 2019-03-08 DIAGNOSIS — J449 Chronic obstructive pulmonary disease, unspecified: Secondary | ICD-10-CM | POA: Diagnosis not present

## 2019-03-08 DIAGNOSIS — M25559 Pain in unspecified hip: Secondary | ICD-10-CM | POA: Diagnosis not present

## 2019-03-14 DIAGNOSIS — E039 Hypothyroidism, unspecified: Secondary | ICD-10-CM | POA: Diagnosis not present

## 2019-03-14 DIAGNOSIS — I10 Essential (primary) hypertension: Secondary | ICD-10-CM | POA: Diagnosis not present

## 2019-03-14 DIAGNOSIS — J449 Chronic obstructive pulmonary disease, unspecified: Secondary | ICD-10-CM | POA: Diagnosis not present

## 2019-03-14 DIAGNOSIS — E1165 Type 2 diabetes mellitus with hyperglycemia: Secondary | ICD-10-CM | POA: Diagnosis not present

## 2019-03-14 DIAGNOSIS — Z794 Long term (current) use of insulin: Secondary | ICD-10-CM | POA: Diagnosis not present

## 2019-03-14 DIAGNOSIS — M25559 Pain in unspecified hip: Secondary | ICD-10-CM | POA: Diagnosis not present

## 2019-03-14 DIAGNOSIS — Z9181 History of falling: Secondary | ICD-10-CM | POA: Diagnosis not present

## 2019-03-16 DIAGNOSIS — Z9181 History of falling: Secondary | ICD-10-CM | POA: Diagnosis not present

## 2019-03-16 DIAGNOSIS — E039 Hypothyroidism, unspecified: Secondary | ICD-10-CM | POA: Diagnosis not present

## 2019-03-16 DIAGNOSIS — J449 Chronic obstructive pulmonary disease, unspecified: Secondary | ICD-10-CM | POA: Diagnosis not present

## 2019-03-16 DIAGNOSIS — E1165 Type 2 diabetes mellitus with hyperglycemia: Secondary | ICD-10-CM | POA: Diagnosis not present

## 2019-03-16 DIAGNOSIS — I10 Essential (primary) hypertension: Secondary | ICD-10-CM | POA: Diagnosis not present

## 2019-03-16 DIAGNOSIS — Z794 Long term (current) use of insulin: Secondary | ICD-10-CM | POA: Diagnosis not present

## 2019-03-16 DIAGNOSIS — M25559 Pain in unspecified hip: Secondary | ICD-10-CM | POA: Diagnosis not present

## 2019-03-18 DIAGNOSIS — E1165 Type 2 diabetes mellitus with hyperglycemia: Secondary | ICD-10-CM | POA: Diagnosis not present

## 2019-03-18 DIAGNOSIS — J449 Chronic obstructive pulmonary disease, unspecified: Secondary | ICD-10-CM | POA: Diagnosis not present

## 2019-03-18 DIAGNOSIS — M25559 Pain in unspecified hip: Secondary | ICD-10-CM | POA: Diagnosis not present

## 2019-03-18 DIAGNOSIS — I10 Essential (primary) hypertension: Secondary | ICD-10-CM | POA: Diagnosis not present

## 2019-03-18 DIAGNOSIS — Z9181 History of falling: Secondary | ICD-10-CM | POA: Diagnosis not present

## 2019-03-18 DIAGNOSIS — E039 Hypothyroidism, unspecified: Secondary | ICD-10-CM | POA: Diagnosis not present

## 2019-03-18 DIAGNOSIS — Z794 Long term (current) use of insulin: Secondary | ICD-10-CM | POA: Diagnosis not present

## 2019-03-20 DIAGNOSIS — J449 Chronic obstructive pulmonary disease, unspecified: Secondary | ICD-10-CM | POA: Diagnosis not present

## 2019-03-20 DIAGNOSIS — M25559 Pain in unspecified hip: Secondary | ICD-10-CM | POA: Diagnosis not present

## 2019-03-20 DIAGNOSIS — E039 Hypothyroidism, unspecified: Secondary | ICD-10-CM | POA: Diagnosis not present

## 2019-03-20 DIAGNOSIS — Z9181 History of falling: Secondary | ICD-10-CM | POA: Diagnosis not present

## 2019-03-20 DIAGNOSIS — I10 Essential (primary) hypertension: Secondary | ICD-10-CM | POA: Diagnosis not present

## 2019-03-20 DIAGNOSIS — Z794 Long term (current) use of insulin: Secondary | ICD-10-CM | POA: Diagnosis not present

## 2019-03-20 DIAGNOSIS — E1165 Type 2 diabetes mellitus with hyperglycemia: Secondary | ICD-10-CM | POA: Diagnosis not present

## 2019-03-24 DIAGNOSIS — M25559 Pain in unspecified hip: Secondary | ICD-10-CM | POA: Diagnosis not present

## 2019-03-24 DIAGNOSIS — I10 Essential (primary) hypertension: Secondary | ICD-10-CM | POA: Diagnosis not present

## 2019-03-24 DIAGNOSIS — J449 Chronic obstructive pulmonary disease, unspecified: Secondary | ICD-10-CM | POA: Diagnosis not present

## 2019-03-24 DIAGNOSIS — Z9181 History of falling: Secondary | ICD-10-CM | POA: Diagnosis not present

## 2019-03-24 DIAGNOSIS — Z794 Long term (current) use of insulin: Secondary | ICD-10-CM | POA: Diagnosis not present

## 2019-03-24 DIAGNOSIS — E1165 Type 2 diabetes mellitus with hyperglycemia: Secondary | ICD-10-CM | POA: Diagnosis not present

## 2019-03-24 DIAGNOSIS — E039 Hypothyroidism, unspecified: Secondary | ICD-10-CM | POA: Diagnosis not present

## 2019-03-27 DIAGNOSIS — J449 Chronic obstructive pulmonary disease, unspecified: Secondary | ICD-10-CM | POA: Diagnosis not present

## 2019-03-27 DIAGNOSIS — Z794 Long term (current) use of insulin: Secondary | ICD-10-CM | POA: Diagnosis not present

## 2019-03-27 DIAGNOSIS — E1165 Type 2 diabetes mellitus with hyperglycemia: Secondary | ICD-10-CM | POA: Diagnosis not present

## 2019-03-27 DIAGNOSIS — M25559 Pain in unspecified hip: Secondary | ICD-10-CM | POA: Diagnosis not present

## 2019-03-27 DIAGNOSIS — E039 Hypothyroidism, unspecified: Secondary | ICD-10-CM | POA: Diagnosis not present

## 2019-03-27 DIAGNOSIS — I10 Essential (primary) hypertension: Secondary | ICD-10-CM | POA: Diagnosis not present

## 2019-03-27 DIAGNOSIS — Z9181 History of falling: Secondary | ICD-10-CM | POA: Diagnosis not present

## 2019-03-28 DIAGNOSIS — E039 Hypothyroidism, unspecified: Secondary | ICD-10-CM | POA: Diagnosis not present

## 2019-03-28 DIAGNOSIS — J449 Chronic obstructive pulmonary disease, unspecified: Secondary | ICD-10-CM | POA: Diagnosis not present

## 2019-03-28 DIAGNOSIS — E1165 Type 2 diabetes mellitus with hyperglycemia: Secondary | ICD-10-CM | POA: Diagnosis not present

## 2019-03-28 DIAGNOSIS — I10 Essential (primary) hypertension: Secondary | ICD-10-CM | POA: Diagnosis not present

## 2019-03-28 DIAGNOSIS — M25559 Pain in unspecified hip: Secondary | ICD-10-CM | POA: Diagnosis not present

## 2019-03-28 DIAGNOSIS — Z794 Long term (current) use of insulin: Secondary | ICD-10-CM | POA: Diagnosis not present

## 2019-03-28 DIAGNOSIS — Z9181 History of falling: Secondary | ICD-10-CM | POA: Diagnosis not present

## 2019-03-29 DIAGNOSIS — Z794 Long term (current) use of insulin: Secondary | ICD-10-CM | POA: Diagnosis not present

## 2019-03-29 DIAGNOSIS — I10 Essential (primary) hypertension: Secondary | ICD-10-CM | POA: Diagnosis not present

## 2019-03-29 DIAGNOSIS — E1165 Type 2 diabetes mellitus with hyperglycemia: Secondary | ICD-10-CM | POA: Diagnosis not present

## 2019-03-29 DIAGNOSIS — M25559 Pain in unspecified hip: Secondary | ICD-10-CM | POA: Diagnosis not present

## 2019-03-29 DIAGNOSIS — Z9181 History of falling: Secondary | ICD-10-CM | POA: Diagnosis not present

## 2019-03-29 DIAGNOSIS — J449 Chronic obstructive pulmonary disease, unspecified: Secondary | ICD-10-CM | POA: Diagnosis not present

## 2019-03-29 DIAGNOSIS — E039 Hypothyroidism, unspecified: Secondary | ICD-10-CM | POA: Diagnosis not present

## 2019-04-05 DIAGNOSIS — E039 Hypothyroidism, unspecified: Secondary | ICD-10-CM | POA: Diagnosis not present

## 2019-04-05 DIAGNOSIS — Z9181 History of falling: Secondary | ICD-10-CM | POA: Diagnosis not present

## 2019-04-05 DIAGNOSIS — J449 Chronic obstructive pulmonary disease, unspecified: Secondary | ICD-10-CM | POA: Diagnosis not present

## 2019-04-05 DIAGNOSIS — Z794 Long term (current) use of insulin: Secondary | ICD-10-CM | POA: Diagnosis not present

## 2019-04-05 DIAGNOSIS — M25559 Pain in unspecified hip: Secondary | ICD-10-CM | POA: Diagnosis not present

## 2019-04-05 DIAGNOSIS — E1165 Type 2 diabetes mellitus with hyperglycemia: Secondary | ICD-10-CM | POA: Diagnosis not present

## 2019-04-05 DIAGNOSIS — I10 Essential (primary) hypertension: Secondary | ICD-10-CM | POA: Diagnosis not present

## 2019-04-09 DIAGNOSIS — J449 Chronic obstructive pulmonary disease, unspecified: Secondary | ICD-10-CM | POA: Diagnosis not present
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
## 2019-04-13 DIAGNOSIS — Z0001 Encounter for general adult medical examination with abnormal findings: Secondary | ICD-10-CM | POA: Diagnosis not present

## 2019-04-13 DIAGNOSIS — J449 Chronic obstructive pulmonary disease, unspecified: Secondary | ICD-10-CM | POA: Diagnosis not present

## 2019-04-13 DIAGNOSIS — E1165 Type 2 diabetes mellitus with hyperglycemia: Secondary | ICD-10-CM | POA: Diagnosis not present

## 2019-04-13 DIAGNOSIS — Z1389 Encounter for screening for other disorder: Secondary | ICD-10-CM | POA: Diagnosis not present

## 2019-04-17 ENCOUNTER — Other Ambulatory Visit: Payer: Self-pay

## 2019-04-17 ENCOUNTER — Telehealth (HOSPITAL_COMMUNITY): Payer: Self-pay | Admitting: Psychiatry

## 2019-04-17 ENCOUNTER — Ambulatory Visit (HOSPITAL_COMMUNITY): Payer: Medicare Other | Admitting: Psychiatry

## 2019-04-17 NOTE — Telephone Encounter (Signed)
Contacted twice for today's appointment. She did not answer the phone, and the voice message was full.

## 2019-04-20 DIAGNOSIS — I10 Essential (primary) hypertension: Secondary | ICD-10-CM | POA: Diagnosis not present

## 2019-04-20 DIAGNOSIS — E785 Hyperlipidemia, unspecified: Secondary | ICD-10-CM | POA: Diagnosis not present

## 2019-04-20 DIAGNOSIS — E039 Hypothyroidism, unspecified: Secondary | ICD-10-CM | POA: Diagnosis not present

## 2019-04-20 DIAGNOSIS — E1165 Type 2 diabetes mellitus with hyperglycemia: Secondary | ICD-10-CM | POA: Diagnosis not present
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
## 2019-05-03 ENCOUNTER — Ambulatory Visit (INDEPENDENT_AMBULATORY_CARE_PROVIDER_SITE_OTHER): Payer: Medicare Other | Admitting: Psychiatry

## 2019-05-03 ENCOUNTER — Encounter (HOSPITAL_COMMUNITY): Payer: Self-pay | Admitting: Psychiatry

## 2019-05-03 ENCOUNTER — Other Ambulatory Visit: Payer: Self-pay

## 2019-05-03 DIAGNOSIS — F3181 Bipolar II disorder: Secondary | ICD-10-CM | POA: Diagnosis not present

## 2019-05-03 DIAGNOSIS — F4310 Post-traumatic stress disorder, unspecified: Secondary | ICD-10-CM

## 2019-05-03 MED ORDER — QUETIAPINE FUMARATE 100 MG PO TABS: 100.0000 mg | ORAL_TABLET | Freq: Every day | ORAL | 2 refills | Status: DC

## 2019-05-03 NOTE — Patient Instructions (Addendum)
1. Continue Paxil 40 mg daily  2. Continue mirtazapine 7.5 mg at night 3. Continue Depakote 250 mg in AM, 1000mg  at night 4. Continue quetiapine 100 mg at night  - Please contact our office if you do not have refill of above medication 5. Next appointment: 9/21 at 9:40

## 2019-05-07 ENCOUNTER — Ambulatory Visit (INDEPENDENT_AMBULATORY_CARE_PROVIDER_SITE_OTHER): Payer: Medicare Other | Admitting: Licensed Clinical Social Worker

## 2019-05-07 ENCOUNTER — Encounter (HOSPITAL_COMMUNITY): Payer: Self-pay | Admitting: Licensed Clinical Social Worker

## 2019-05-07 ENCOUNTER — Other Ambulatory Visit: Payer: Self-pay

## 2019-05-07 DIAGNOSIS — F3181 Bipolar II disorder: Secondary | ICD-10-CM | POA: Diagnosis not present

## 2019-05-07 NOTE — Progress Notes (Signed)
Virtual Visit via Telephone Note  I connected with Claudia Keller on 05/07/19 at  1:00 PM EDT by telephone and verified that I am speaking with the correct person using two identifiers.   I discussed the limitations, risks, security and privacy concerns of performing an evaluation and management service by telephone and the availability of in person appointments. I also discussed with the patient that there may be a patient responsible charge related to this service. The patient expressed understanding and agreed to proceed.   History of Present Illness: Claudia Keller presents oriented x5 (person, place, situation, time, and object), casually dressed, appropriately groomed, average height, average weight, and cooperative to address mood. Patient has a history of medical treatment including IBS, hypertension and hypothyroidism. Patient has a history of mental health treatment including outpatient therapy, medication management, and hospitalization. Patient denies suicidal and homicidal ideations.  Patient denies psychosis including auditory and visual hallucinations. Patient denies substance abuse. Patient is at low risk for lethality.   Observations/Objective: Physically: Patient is doing well physically. She had a fall getting out of a car but has not fallen since. She doesn't report any pain from the fall. She is trying to get a walker or another form assistance to help her walk.   Spiritually/values: Patient is doing well spiritually.  Relationships: Patient is getting along with her family and she talks to her family by phone.  Emotionally/Mentally/Behaviorally:  Patient's mood has been stable. She was frustrated with being told that if she missed another medication management appointment that she would be discharged because she couldn't remember missing more than one appointment. Patient knows she needs therapy and medication to keep her stable. Patient is managing her mood and remaining stable.    Patient engaged in session. She responded well to interventions. Patient continues to meet criteria for Bipolar II. Patient will continue in outpatient therapy due to being the least restrictive service to meet her needs. Patient made minimal progress on her goals.  Assessment and Plan: Therapist reviewed patient's recent thoughts and behavior. Therapist utilized CBT to address mood. Therapist processed patient's feelings to identify triggers for mood. Therapist discussed with patient daily stressors and frustrations that she has.   Suicidal/Homicidal: Negativewithout intent/plan  Follow Up Instructions: Plan: Return again in 4 weeks.   I discussed the assessment and treatment plan with the patient. The patient was provided an opportunity to ask questions and all were answered. The patient agreed with the plan and demonstrated an understanding of the instructions.   The patient was advised to call back or seek an in-person evaluation if the symptoms worsen or if the condition fails to improve as anticipated.  I provided 30 minutes of non-face-to-face time during this encounter.   Glori Bickers, LCSW

## 2019-05-12 DIAGNOSIS — E1165 Type 2 diabetes mellitus with hyperglycemia: Secondary | ICD-10-CM | POA: Diagnosis not present

## 2019-05-12 DIAGNOSIS — I10 Essential (primary) hypertension: Secondary | ICD-10-CM | POA: Diagnosis not present

## 2019-06-12 DIAGNOSIS — E039 Hypothyroidism, unspecified: Secondary | ICD-10-CM | POA: Diagnosis not present

## 2019-06-12 DIAGNOSIS — E1165 Type 2 diabetes mellitus with hyperglycemia: Secondary | ICD-10-CM | POA: Diagnosis not present

## 2019-06-15 ENCOUNTER — Other Ambulatory Visit: Payer: Self-pay | Admitting: Internal Medicine

## 2019-07-12 DIAGNOSIS — E1165 Type 2 diabetes mellitus with hyperglycemia: Secondary | ICD-10-CM | POA: Diagnosis not present

## 2019-07-12 DIAGNOSIS — R079 Chest pain, unspecified: Secondary | ICD-10-CM | POA: Diagnosis not present

## 2019-07-12 DIAGNOSIS — E039 Hypothyroidism, unspecified: Secondary | ICD-10-CM | POA: Diagnosis not present
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
## 2019-07-30 ENCOUNTER — Other Ambulatory Visit (HOSPITAL_COMMUNITY): Payer: Self-pay | Admitting: Psychiatry

## 2019-07-30 ENCOUNTER — Ambulatory Visit (INDEPENDENT_AMBULATORY_CARE_PROVIDER_SITE_OTHER): Payer: Medicare Other | Admitting: Psychiatry

## 2019-07-30 ENCOUNTER — Encounter (HOSPITAL_COMMUNITY): Payer: Self-pay | Admitting: Psychiatry

## 2019-07-30 ENCOUNTER — Other Ambulatory Visit: Payer: Self-pay

## 2019-07-30 DIAGNOSIS — F3181 Bipolar II disorder: Secondary | ICD-10-CM | POA: Diagnosis not present

## 2019-07-30 MED ORDER — QUETIAPINE FUMARATE 100 MG PO TABS: 100.0000 mg | ORAL_TABLET | Freq: Every day | ORAL | 3 refills | Status: DC

## 2019-07-30 NOTE — Patient Instructions (Signed)
1. Continue Paxil 40 mg daily  2. Continue mirtazapine 7.5 mg at night 3. Continue Depakote 250 mg in AM, 1000mg  at night 4. Continue quetiapine 100 mg at night  5. Next appointment: in four months 6. Obtain blood test (VPA, CMP, CBC)

## 2019-08-11 DIAGNOSIS — E1165 Type 2 diabetes mellitus with hyperglycemia: Secondary | ICD-10-CM | POA: Diagnosis not present

## 2019-08-11 DIAGNOSIS — I10 Essential (primary) hypertension: Secondary | ICD-10-CM | POA: Diagnosis not present

## 2019-08-14 DIAGNOSIS — Z23 Encounter for immunization: Secondary | ICD-10-CM | POA: Diagnosis not present

## 2019-08-15 ENCOUNTER — Encounter (HOSPITAL_COMMUNITY): Payer: Self-pay | Admitting: Psychiatry

## 2019-08-15 LAB — CBC WITH DIFFERENTIAL/PLATELET
Absolute Monocytes: 835 cells/uL (ref 200–950)
Basophils Absolute: 75 cells/uL (ref 0–200)
Basophils Relative: 0.7 %
Eosinophils Absolute: 75 cells/uL (ref 15–500)
Eosinophils Relative: 0.7 %
HCT: 43.5 % (ref 35.0–45.0)
Hemoglobin: 14.7 g/dL (ref 11.7–15.5)
Lymphs Abs: 3403 cells/uL (ref 850–3900)
MCH: 32 pg (ref 27.0–33.0)
MCHC: 33.8 g/dL (ref 32.0–36.0)
MCV: 94.8 fL (ref 80.0–100.0)
MPV: 12.8 fL — ABNORMAL HIGH (ref 7.5–12.5)
Monocytes Relative: 7.8 %
Neutro Abs: 6313 cells/uL (ref 1500–7800)
Neutrophils Relative %: 59 %
Platelets: 228 10*3/uL (ref 140–400)
RBC: 4.59 10*6/uL (ref 3.80–5.10)
RDW: 13.1 % (ref 11.0–15.0)
Total Lymphocyte: 31.8 %
WBC: 10.7 10*3/uL (ref 3.8–10.8)

## 2019-08-15 LAB — COMPREHENSIVE METABOLIC PANEL
AG Ratio: 1.7 (calc) (ref 1.0–2.5)
ALT: 8 U/L (ref 6–29)
AST: 11 U/L (ref 10–35)
Albumin: 4.3 g/dL (ref 3.6–5.1)
Alkaline phosphatase (APISO): 70 U/L (ref 37–153)
BUN/Creatinine Ratio: 16 (calc) (ref 6–22)
BUN: 17 mg/dL (ref 7–25)
CO2: 30 mmol/L (ref 20–32)
Calcium: 10.7 mg/dL — ABNORMAL HIGH (ref 8.6–10.4)
Chloride: 97 mmol/L — ABNORMAL LOW (ref 98–110)
Creat: 1.07 mg/dL — ABNORMAL HIGH (ref 0.50–0.99)
Globulin: 2.5 g/dL (calc) (ref 1.9–3.7)
Glucose, Bld: 94 mg/dL (ref 65–139)
Potassium: 4.2 mmol/L (ref 3.5–5.3)
Sodium: 137 mmol/L (ref 135–146)
Total Bilirubin: 0.5 mg/dL (ref 0.2–1.2)
Total Protein: 6.8 g/dL (ref 6.1–8.1)

## 2019-08-15 LAB — VALPROIC ACID LEVEL: Valproic Acid Lvl: 73.6 mg/L (ref 50.0–100.0)

## 2019-08-21 ENCOUNTER — Ambulatory Visit: Payer: Medicare Other | Admitting: Gastroenterology

## 2019-09-07 ENCOUNTER — Other Ambulatory Visit: Payer: Self-pay

## 2019-09-07 DIAGNOSIS — Z20822 Contact with and (suspected) exposure to covid-19: Secondary | ICD-10-CM

## 2019-09-09 LAB — NOVEL CORONAVIRUS, NAA: SARS-CoV-2, NAA: NOT DETECTED

## 2019-09-11 ENCOUNTER — Telehealth: Payer: Self-pay | Admitting: Internal Medicine

## 2019-09-11 DIAGNOSIS — E1165 Type 2 diabetes mellitus with hyperglycemia: Secondary | ICD-10-CM | POA: Diagnosis not present

## 2019-09-11 DIAGNOSIS — E039 Hypothyroidism, unspecified: Secondary | ICD-10-CM | POA: Diagnosis not present

## 2019-09-11 NOTE — Telephone Encounter (Signed)
Negative COVID results given. Patient results "NOT Detected." Caller expressed understanding. ° °

## 2019-10-09 DIAGNOSIS — E1165 Type 2 diabetes mellitus with hyperglycemia: Secondary | ICD-10-CM | POA: Diagnosis not present

## 2019-10-09 DIAGNOSIS — J449 Chronic obstructive pulmonary disease, unspecified: Secondary | ICD-10-CM | POA: Diagnosis not present

## 2019-10-09 DIAGNOSIS — E039 Hypothyroidism, unspecified: Secondary | ICD-10-CM | POA: Diagnosis not present

## 2019-10-11 DIAGNOSIS — E039 Hypothyroidism, unspecified: Secondary | ICD-10-CM | POA: Diagnosis not present

## 2019-10-11 DIAGNOSIS — E1165 Type 2 diabetes mellitus with hyperglycemia: Secondary | ICD-10-CM | POA: Diagnosis not present

## 2019-10-11 DIAGNOSIS — I10 Essential (primary) hypertension: Secondary | ICD-10-CM | POA: Diagnosis not present

## 2019-10-29 ENCOUNTER — Telehealth (HOSPITAL_COMMUNITY): Payer: Self-pay | Admitting: Clinical

## 2019-10-29 ENCOUNTER — Other Ambulatory Visit: Payer: Self-pay

## 2019-10-29 ENCOUNTER — Ambulatory Visit (HOSPITAL_COMMUNITY): Payer: Medicare Other | Admitting: Clinical

## 2019-10-29 NOTE — Telephone Encounter (Signed)
The OPT therapist attempted as indicated to contact client via telephone x2 at 1:00AM and 1:05AM. The OPT therapist left a VM with the call back number. The patient did not call back and was not avalible for the scheduled session.

## 2019-11-09 DIAGNOSIS — I10 Essential (primary) hypertension: Secondary | ICD-10-CM | POA: Diagnosis not present

## 2019-11-19 ENCOUNTER — Ambulatory Visit (HOSPITAL_COMMUNITY): Payer: Medicare Other | Admitting: Psychiatry

## 2019-11-29 ENCOUNTER — Ambulatory Visit (INDEPENDENT_AMBULATORY_CARE_PROVIDER_SITE_OTHER): Payer: Medicare Other | Admitting: Psychiatry

## 2019-11-29 ENCOUNTER — Other Ambulatory Visit: Payer: Self-pay

## 2019-11-29 ENCOUNTER — Encounter: Payer: Self-pay | Admitting: Psychiatry

## 2019-11-29 DIAGNOSIS — F3181 Bipolar II disorder: Secondary | ICD-10-CM | POA: Diagnosis not present

## 2019-11-29 DIAGNOSIS — F4310 Post-traumatic stress disorder, unspecified: Secondary | ICD-10-CM | POA: Diagnosis not present

## 2019-11-29 MED ORDER — QUETIAPINE FUMARATE 100 MG PO TABS: 100.0000 mg | ORAL_TABLET | Freq: Every day | ORAL | 1 refills | Status: DC

## 2019-11-29 MED ORDER — DIVALPROEX SODIUM ER 500 MG PO TB24: 500.0000 mg | ORAL_TABLET | Freq: Every evening | ORAL | 1 refills | Status: DC

## 2019-11-29 MED ORDER — MIRTAZAPINE 7.5 MG PO TABS: 7.5000 mg | ORAL_TABLET | Freq: Every day | ORAL | 1 refills | Status: DC

## 2019-11-29 MED ORDER — DIVALPROEX SODIUM 250 MG PO DR TAB: 250.0000 mg | DELAYED_RELEASE_TABLET | Freq: Every day | ORAL | 1 refills | Status: DC

## 2019-11-29 MED ORDER — PAROXETINE HCL 40 MG PO TABS: 40.0000 mg | ORAL_TABLET | Freq: Every day | ORAL | 1 refills | Status: DC

## 2019-11-29 NOTE — Progress Notes (Signed)
[place] MD/PA/NP OP Progress Note  Virtual Visit via Telephone Note  I connected with Claudia Keller on 11/29/19 at 11:00 AM EST by telephone and verified that I am speaking with the correct person using two identifiers.  I discussed the limitations, risks, security and privacy concerns of performing an evaluation and management service by telephone and the availability of in person appointments. I also discussed with the patient that there may be a patient responsible charge related to this service. The patient expressed understanding and agreed to proceed.   11/29/2019 11:00 AM Claudia Keller  MRN:  [id number]  Chief Complaint: " I am doing fine."  HPI: Claudia Keller is a 69 y.o. year old female with a history of bipolar II disorder, PTSD,borderline personality disorder,type II diabetes, COPD, hypertension , who was contacted for a phone visit for follow-up.   Patient reported doing well on her current medication regimen.  She stated that her mood has been stable.  She has been sleeping fine.  Her latest lab results were reviewed with her. Latest VPA level is 73.6. She denies any acute issues or concerns at this time.  She did request to see a therapist.  Visit Diagnosis:    ICD-10-CM   1. Bipolar II disorder ([place])  F31.81   2. PTSD (post-traumatic stress disorder)  F43.10     Past Psychiatric History: Bipolar disorder, PTSD  Past Medical History:  Past Medical History:  Diagnosis Date  . Arthritis   . COPD (chronic obstructive pulmonary disease) ([place])   . Diverticulosis   . Essential hypertension, benign   . Hx of colonic polyps   . Hypothyroidism   . IBS (irritable bowel syndrome)   . Irritable bowel syndrome   . Ketoacidosis   . Mixed hyperlipidemia   . PTSD (post-traumatic stress disorder)   . S/P colonoscopy Jan 2011   RMR: pancolonic diverticula, polypoid rectal mucosa with  prominent lymphoid aggregates, repeat in Jan 2016   . Shortness of breath dyspnea   . Stress  incontinence, female   . Thyroid disease   . Tobacco use   . Type 2 diabetes mellitus ([place])     Past Surgical History:  Procedure Laterality Date  . Bilateral tubal ligation    . BREAST BIOPSY Left 10/16/2014   negative  . CHOLECYSTECTOMY  09/11/2012   Procedure: LAPAROSCOPIC CHOLECYSTECTOMY;  Surgeon: Jamesetta So, MD;  Location: [facility];  Service: General;  Laterality: N/A;  . COLONOSCOPY  11/17/2009   Dr. Gala Romney: normal rectum, pancolonic diverticula, polyps benign, no adenomas.   . COLONOSCOPY N/A 02/12/2015   Dr. Gala Romney: colonic diverticulosis, multiple colonic polyps (tubular adenomas), negative segmental biopsies. Surveillance in 2021  . ESOPHAGOGASTRODUODENOSCOPY  11/23/2011   Dr. Gala Romney: Erosive reflux esophagitis; small hiatal hernia; esophagus dilated empirically  . MALONEY DILATION  11/23/2011   Procedure: Venia Minks DILATION;  Surgeon: Daneil Dolin, MD;  Location: [facility];  Service: Endoscopy;  Laterality: N/A;  . SKIN LESION EXCISION     on buttocks  . TUBAL LIGATION      Family Psychiatric History: see below  Family History:  Family History  Problem Relation Age of Onset  . Schizophrenia Brother   . Stroke Brother   . Cancer Mother        unsure what type  . Heart disease Father   . Heart attack Father   . Coronary artery disease Other   . Diabetes type II Other   . Cancer Son  bladder  . Colon cancer Neg Hx     Social History:  Social History   Socioeconomic History  . Marital status: Legally Separated    Spouse name: Not on file  . Number of children: Not on file  . Years of education: Not on file  . Highest education level: Not on file  Occupational History  . Not on file  Tobacco Use  . Smoking status: Current Every Day Smoker    Packs/day: 2.00    Years: 43.00    Pack years: 86.00    Types: Cigarettes  . Smokeless tobacco: Never Used  Substance and Sexual Activity  . Alcohol use: No    Alcohol/week: 0.0 standard drinks  . Drug use:  No  . Sexual activity: Not Currently    Birth control/protection: Surgical, Post-menopausal    Comment: tubal  Other Topics Concern  . Not on file  Social History Narrative  . Not on file   Social Determinants of Health   Financial Resource Strain:   . Difficulty of Paying Living Expenses: Not on file  Food Insecurity:   . Worried About Charity fundraiser in the Last Year: Not on file  . Ran Out of Food in the Last Year: Not on file  Transportation Needs:   . Lack of Transportation (Medical): Not on file  . Lack of Transportation (Non-Medical): Not on file  Physical Activity:   . Days of Exercise per Week: Not on file  . Minutes of Exercise per Session: Not on file  Stress:   . Feeling of Stress : Not on file  Social Connections:   . Frequency of Communication with Friends and Family: Not on file  . Frequency of Social Gatherings with Friends and Family: Not on file  . Attends Religious Services: Not on file  . Active Member of Clubs or Organizations: Not on file  . Attends Archivist Meetings: Not on file  . Marital Status: Not on file    Allergies: No Known Allergies  Metabolic Disorder Labs: Lab Results  Component Value Date   HGBA1C 11.8 08/04/2016   No results found for: PROLACTIN Lab Results  Component Value Date   CHOL 201 (A) 08/04/2016   TRIG 213 (A) 08/04/2016   HDL 31 (A) 08/04/2016   LDLCALC 135 08/04/2016   Lab Results  Component Value Date   TSH 13.77 (H) 05/10/2018   TSH 15.337 (H) 11/03/2016    Therapeutic Level Labs: No results found for: LITHIUM Lab Results  Component Value Date   VALPROATE 73.6 08/14/2019   VALPROATE 87.9 01/15/2019   No components found for:  CBMZ  Current Medications: Current Outpatient Medications  Medication Sig Dispense Refill  . amLODipine (NORVASC) 10 MG tablet Take 10 mg by mouth daily.     Marland Kitchen aspirin EC 81 MG tablet Take 81 mg by mouth daily.    . Calcium 600-200 MG-UNIT tablet Take 1 tablet by  mouth daily.    . cetirizine (ZYRTEC) 10 MG tablet Take 10 mg by mouth daily.     . colestipol (COLESTID) 1 g tablet TAKE 2 TABLETS DAILY FOR DIARRHEA. DO NOT TAKE WITHIN 2 HOURS OF OTHER ORAL MEDICATIONS. HOLD FOR CONSTIPATION. 56 tablet 11  . dicyclomine (BENTYL) 10 MG capsule Take 1 capsule (10 mg total) by mouth 4 (four) times daily as needed for spasms. 90 capsule 1  . divalproex (DEPAKOTE ER) 500 MG 24 hr tablet Take 500 mg by mouth every evening.    Marland Kitchen  divalproex (DEPAKOTE) 250 MG DR tablet Take 250 mg by mouth daily.     . furosemide (LASIX) 20 MG tablet Take 20 mg by mouth daily.     Marland Kitchen gabapentin (NEURONTIN) 100 MG capsule Take 100 mg by mouth 3 (three) times daily.     . insulin glargine (LANTUS) 100 UNIT/ML injection Inject 60 Units into the skin at bedtime.     . insulin lispro (HUMALOG) 100 UNIT/ML injection Inject 15-24 Units into the skin 3 (three) times daily with meals. Per sliding scale. 151-200=18 units; 201-250= 9 units; 251-300=20 units; 301-350=21 units; 351-400= 22 units; 401-450=23 units; 500-551=24 units.    Marland Kitchen levothyroxine (SYNTHROID, LEVOTHROID) 150 MCG tablet Take 150 mcg by mouth daily before breakfast.    . linagliptin (TRADJENTA) 5 MG TABS tablet Take 5 mg by mouth 2 (two) times daily.     Marland Kitchen lisinopril (PRINIVIL,ZESTRIL) 20 MG tablet Take 20 mg by mouth daily.    Marland Kitchen loperamide (IMODIUM) 2 MG capsule Take by mouth as needed for diarrhea or loose stools.    . metFORMIN (GLUCOPHAGE) 500 MG tablet Take 500 mg by mouth 2 (two) times daily.     . mirtazapine (REMERON) 15 MG tablet Take 7.5 mg by mouth at bedtime.    . naproxen sodium (ALEVE) 220 MG tablet Take 220-440 mg by mouth daily as needed (for pain).    . ondansetron (ZOFRAN) 4 MG tablet Take 1 tablet (4 mg total) by mouth every 8 (eight) hours as needed for nausea or vomiting. 30 tablet 2  . pantoprazole (PROTONIX) 40 MG tablet TAKE 1 TABLET BY MOUTH ONCE DAILY 30 MINTUES BEFORE BREAKFAST. 28 tablet 11  . PARoxetine  (PAXIL) 40 MG tablet Take 40 mg by mouth daily.     . QUEtiapine (SEROQUEL) 100 MG tablet Take 1 tablet (100 mg total) by mouth at bedtime. 30 tablet 3  . simvastatin (ZOCOR) 40 MG tablet Take 40 mg by mouth at bedtime.      No current facility-administered medications for this visit.     Psychiatric Specialty Exam: Review of Systems  There were no vitals taken for this visit.There is no height or weight on file to calculate BMI.  General Appearance: Unable to assess due to phone visit  Eye Contact: Unable to assess due to phone visit  Speech:  Clear and Coherent and Normal Rate  Volume:  Normal  Mood:  Euthymic  Affect:  Congruent  Thought Process:  Goal Directed, Linear and Descriptions of Associations: Intact  Orientation:  Full (Time, Place, and Person)  Thought Content: Logical   Suicidal Thoughts:  No  Homicidal Thoughts:  No  Memory:  Recent;   Good Remote;   Good  Judgement:  Fair  Insight:  Fair  Psychomotor Activity:  Normal  Concentration:  Concentration: Good and Attention Span: Good  Recall:  Good  Fund of Knowledge: Good  Language: Good  Akathisia:  Negative  Handed:  Right  AIMS (if indicated): Not done due to phone visit  Assets:  Communication Skills Desire for Improvement Financial Resources/Insurance Housing  ADL's:  Intact  Cognition: WNL  Sleep:  Good   Screenings: PHQ2-9     Office Visit from 09/15/2016 in [facility] Office Visit from 09/07/2016 in [facility] Office Visit from 08/17/2016 in [facility] Office Visit from 09/22/2015 in [facility]  PHQ-2 Total Score  1  0  0  0       Assessment and Plan:  Patient is doing well on current medication regimen.  1. Bipolar II disorder (HCC)  - divalproex (DEPAKOTE ER) 500 MG 24 hr tablet; Take 1 tablet (500 mg total) by mouth every evening.  Dispense: 90 tablet; Refill: 1 - divalproex (DEPAKOTE) 250 MG DR tablet; Take  1 tablet (250 mg total) by mouth daily.  Dispense: 90 tablet; Refill: 1 - QUEtiapine (SEROQUEL) 100 MG tablet; Take 1 tablet (100 mg total) by mouth at bedtime.  Dispense: 90 tablet; Refill: 1 - PARoxetine (PAXIL) 40 MG tablet; Take 1 tablet (40 mg total) by mouth daily.  Dispense: 90 tablet; Refill: 1 - mirtazapine (REMERON) 7.5 MG tablet; Take 1 tablet (7.5 mg total) by mouth at bedtime.  Dispense: 90 tablet; Refill: 1  2. PTSD (post-traumatic stress disorder)  - PARoxetine (PAXIL) 40 MG tablet; Take 1 tablet (40 mg total) by mouth daily.  Dispense: 90 tablet; Refill: 1 - mirtazapine (REMERON) 7.5 MG tablet; Take 1 tablet (7.5 mg total) by mouth at bedtime.  Dispense: 90 tablet; Refill: 1  Continue same medication regimen. Follow up in 3 months.    [place] Crane, MD 11/29/2019, 11:00 AM

## 2019-12-10 DIAGNOSIS — I10 Essential (primary) hypertension: Secondary | ICD-10-CM | POA: Diagnosis not present

## 2020-01-21 ENCOUNTER — Encounter: Payer: Self-pay | Admitting: Internal Medicine

## 2020-02-07 DIAGNOSIS — E1165 Type 2 diabetes mellitus with hyperglycemia: Secondary | ICD-10-CM | POA: Diagnosis not present

## 2020-02-16 ENCOUNTER — Ambulatory Visit: Payer: Self-pay
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
## 2020-02-27 ENCOUNTER — Encounter (HOSPITAL_COMMUNITY): Payer: Self-pay | Admitting: Psychiatry

## 2020-02-27 ENCOUNTER — Other Ambulatory Visit: Payer: Self-pay

## 2020-02-27 ENCOUNTER — Ambulatory Visit (HOSPITAL_COMMUNITY): Payer: Medicare Other | Admitting: Psychiatry

## 2020-02-27 ENCOUNTER — Telehealth (INDEPENDENT_AMBULATORY_CARE_PROVIDER_SITE_OTHER): Payer: Medicare Other | Admitting: Psychiatry

## 2020-02-27 DIAGNOSIS — F4310 Post-traumatic stress disorder, unspecified: Secondary | ICD-10-CM | POA: Diagnosis not present

## 2020-02-27 DIAGNOSIS — F3181 Bipolar II disorder: Secondary | ICD-10-CM | POA: Diagnosis not present

## 2020-02-27 NOTE — Patient Instructions (Signed)
1. Continue Paxil 40 mg daily  2. Continue mirtazapine 7.5 mg at night 3. Continue Depakote 250 mg in AM, 1000mg  at night 4. Continue quetiapine 100 mg at night  5. Next appointment: 7/29 at 10 AM

## 2020-03-08 DIAGNOSIS — I10 Essential (primary) hypertension: Secondary | ICD-10-CM | POA: Diagnosis not present

## 2020-03-17 ENCOUNTER — Inpatient Hospital Stay (HOSPITAL_COMMUNITY)
Admission: EM | Admit: 2020-03-17 | Discharge: 2020-03-20 | DRG: 638 | Disposition: A | Discharge status: Home Health | Payer: Medicare Other | Attending: Internal Medicine | Admitting: Internal Medicine

## 2020-03-17 ENCOUNTER — Encounter (HOSPITAL_COMMUNITY): Payer: Self-pay | Admitting: Emergency Medicine

## 2020-03-17 ENCOUNTER — Other Ambulatory Visit: Payer: Self-pay

## 2020-03-17 DIAGNOSIS — F4310 Post-traumatic stress disorder, unspecified: Secondary | ICD-10-CM | POA: Diagnosis present

## 2020-03-17 DIAGNOSIS — E1122 Type 2 diabetes mellitus with diabetic chronic kidney disease: Secondary | ICD-10-CM | POA: Diagnosis not present

## 2020-03-17 DIAGNOSIS — E66812 Obesity, class 2: Secondary | ICD-10-CM

## 2020-03-17 DIAGNOSIS — I129 Hypertensive chronic kidney disease with stage 1 through stage 4 chronic kidney disease, or unspecified chronic kidney disease: Secondary | ICD-10-CM | POA: Diagnosis not present

## 2020-03-17 DIAGNOSIS — F17210 Nicotine dependence, cigarettes, uncomplicated: Secondary | ICD-10-CM | POA: Diagnosis present

## 2020-03-17 DIAGNOSIS — Z20822 Contact with and (suspected) exposure to covid-19: Secondary | ICD-10-CM | POA: Diagnosis not present

## 2020-03-17 DIAGNOSIS — Z79890 Hormone replacement therapy (postmenopausal): Secondary | ICD-10-CM

## 2020-03-17 DIAGNOSIS — K559 Vascular disorder of intestine, unspecified: Secondary | ICD-10-CM | POA: Diagnosis not present

## 2020-03-17 DIAGNOSIS — E6609 Other obesity due to excess calories: Secondary | ICD-10-CM | POA: Diagnosis present

## 2020-03-17 DIAGNOSIS — N179 Acute kidney failure, unspecified: Secondary | ICD-10-CM

## 2020-03-17 DIAGNOSIS — Z6837 Body mass index (BMI) 37.0-37.9, adult: Secondary | ICD-10-CM | POA: Diagnosis not present

## 2020-03-17 DIAGNOSIS — IMO0002 Reserved for concepts with insufficient information to code with codable children: Secondary | ICD-10-CM

## 2020-03-17 DIAGNOSIS — N1831 Chronic kidney disease, stage 3a: Secondary | ICD-10-CM | POA: Diagnosis not present

## 2020-03-17 DIAGNOSIS — M1990 Unspecified osteoarthritis, unspecified site: Secondary | ICD-10-CM | POA: Diagnosis not present

## 2020-03-17 DIAGNOSIS — Y92009 Unspecified place in unspecified non-institutional (private) residence as the place of occurrence of the external cause: Secondary | ICD-10-CM | POA: Diagnosis not present

## 2020-03-17 DIAGNOSIS — E869 Volume depletion, unspecified: Secondary | ICD-10-CM | POA: Diagnosis present

## 2020-03-17 DIAGNOSIS — I10 Essential (primary) hypertension: Secondary | ICD-10-CM | POA: Diagnosis present

## 2020-03-17 DIAGNOSIS — T383X6A Underdosing of insulin and oral hypoglycemic [antidiabetic] drugs, initial encounter: Secondary | ICD-10-CM | POA: Diagnosis not present

## 2020-03-17 DIAGNOSIS — E1110 Type 2 diabetes mellitus with ketoacidosis without coma: Principal | ICD-10-CM | POA: Diagnosis present

## 2020-03-17 DIAGNOSIS — R1111 Vomiting without nausea: Secondary | ICD-10-CM | POA: Diagnosis not present

## 2020-03-17 DIAGNOSIS — Z91148 Patient's other noncompliance with medication regimen for other reason: Secondary | ICD-10-CM

## 2020-03-17 DIAGNOSIS — E1165 Type 2 diabetes mellitus with hyperglycemia: Secondary | ICD-10-CM | POA: Diagnosis not present

## 2020-03-17 DIAGNOSIS — K3184 Gastroparesis: Secondary | ICD-10-CM | POA: Diagnosis not present

## 2020-03-17 DIAGNOSIS — R197 Diarrhea, unspecified: Secondary | ICD-10-CM | POA: Diagnosis not present

## 2020-03-17 DIAGNOSIS — E039 Hypothyroidism, unspecified: Secondary | ICD-10-CM | POA: Diagnosis present

## 2020-03-17 DIAGNOSIS — E782 Mixed hyperlipidemia: Secondary | ICD-10-CM | POA: Diagnosis not present

## 2020-03-17 DIAGNOSIS — Z79899 Other long term (current) drug therapy: Secondary | ICD-10-CM

## 2020-03-17 DIAGNOSIS — K5010 Crohn's disease of large intestine without complications: Secondary | ICD-10-CM

## 2020-03-17 DIAGNOSIS — Z794 Long term (current) use of insulin: Secondary | ICD-10-CM | POA: Diagnosis not present

## 2020-03-17 DIAGNOSIS — E7800 Pure hypercholesterolemia, unspecified: Secondary | ICD-10-CM | POA: Diagnosis present

## 2020-03-17 DIAGNOSIS — Z8249 Family history of ischemic heart disease and other diseases of the circulatory system: Secondary | ICD-10-CM

## 2020-03-17 DIAGNOSIS — Z9114 Patient's other noncompliance with medication regimen: Secondary | ICD-10-CM | POA: Diagnosis not present

## 2020-03-17 DIAGNOSIS — E1010 Type 1 diabetes mellitus with ketoacidosis without coma: Secondary | ICD-10-CM | POA: Diagnosis not present

## 2020-03-17 DIAGNOSIS — Z743 Need for continuous supervision: Secondary | ICD-10-CM | POA: Diagnosis not present

## 2020-03-17 DIAGNOSIS — Z720 Tobacco use: Secondary | ICD-10-CM | POA: Diagnosis not present

## 2020-03-17 DIAGNOSIS — E119 Type 2 diabetes mellitus without complications: Secondary | ICD-10-CM | POA: Diagnosis present

## 2020-03-17 DIAGNOSIS — K6389 Other specified diseases of intestine: Secondary | ICD-10-CM | POA: Diagnosis not present

## 2020-03-17 DIAGNOSIS — E1143 Type 2 diabetes mellitus with diabetic autonomic (poly)neuropathy: Secondary | ICD-10-CM | POA: Diagnosis not present

## 2020-03-17 DIAGNOSIS — R8281 Pyuria: Secondary | ICD-10-CM | POA: Diagnosis present

## 2020-03-17 DIAGNOSIS — J449 Chronic obstructive pulmonary disease, unspecified: Secondary | ICD-10-CM | POA: Diagnosis present

## 2020-03-17 DIAGNOSIS — Z833 Family history of diabetes mellitus: Secondary | ICD-10-CM

## 2020-03-17 DIAGNOSIS — R0902 Hypoxemia: Secondary | ICD-10-CM | POA: Diagnosis not present

## 2020-03-17 DIAGNOSIS — R112 Nausea with vomiting, unspecified: Secondary | ICD-10-CM | POA: Diagnosis not present

## 2020-03-17 HISTORY — DX: Type 2 diabetes mellitus with ketoacidosis without coma: E11.10

## 2020-03-17 LAB — BASIC METABOLIC PANEL
Anion gap: 15 (ref 5–15)
Anion gap: 11 (ref 5–15)
BUN: 21 mg/dL (ref 8–23)
BUN: 19 mg/dL (ref 8–23)
CO2: 23 mmol/L (ref 22–32)
CO2: 26 mmol/L (ref 22–32)
Calcium: 8.7 mg/dL — ABNORMAL LOW (ref 8.9–10.3)
Calcium: 8.3 mg/dL — ABNORMAL LOW (ref 8.9–10.3)
Chloride: 97 mmol/L — ABNORMAL LOW (ref 98–111)
Chloride: 102 mmol/L (ref 98–111)
Creatinine, Ser: 1.32 mg/dL — ABNORMAL HIGH (ref 0.44–1.00)
Creatinine, Ser: 1.22 mg/dL — ABNORMAL HIGH (ref 0.44–1.00)
GFR calc Af Amer: 48 mL/min — ABNORMAL LOW (ref >60)
GFR calc Af Amer: 53 mL/min — ABNORMAL LOW (ref >60)
GFR calc non Af Amer: 42 mL/min — ABNORMAL LOW (ref >60)
GFR calc non Af Amer: 46 mL/min — ABNORMAL LOW (ref >60)
Glucose, Bld: 350 mg/dL — ABNORMAL HIGH (ref 70–99)
Glucose, Bld: 155 mg/dL — ABNORMAL HIGH (ref 70–99)
Potassium: 3.9 mmol/L (ref 3.5–5.1)
Potassium: 3.7 mmol/L (ref 3.5–5.1)
Sodium: 135 mmol/L (ref 135–145)
Sodium: 139 mmol/L (ref 135–145)

## 2020-03-17 LAB — CBG MONITORING, ED
Glucose-Capillary: 367 mg/dL — ABNORMAL HIGH (ref 70–99)
Glucose-Capillary: 334 mg/dL — ABNORMAL HIGH (ref 70–99)
Glucose-Capillary: 321 mg/dL — ABNORMAL HIGH (ref 70–99)
Glucose-Capillary: 176 mg/dL — ABNORMAL HIGH (ref 70–99)
Glucose-Capillary: 139 mg/dL — ABNORMAL HIGH (ref 70–99)

## 2020-03-17 LAB — URINALYSIS, ROUTINE W REFLEX MICROSCOPIC
Bilirubin Urine: NEGATIVE
Glucose, UA: >=500 mg/dL — AB
Ketones, ur: 20 mg/dL — AB
Nitrite: NEGATIVE
Protein, ur: 30 mg/dL — AB
Specific Gravity, Urine: 1.018 (ref 1.005–1.030)
pH: 5 (ref 5.0–8.0)

## 2020-03-17 LAB — COMPREHENSIVE METABOLIC PANEL
ALT: 19 U/L (ref 0–44)
AST: 19 U/L (ref 15–41)
Albumin: 4.1 g/dL (ref 3.5–5.0)
Alkaline Phosphatase: 70 U/L (ref 38–126)
Anion gap: 17 — ABNORMAL HIGH (ref 5–15)
BUN: 21 mg/dL (ref 8–23)
CO2: 25 mmol/L (ref 22–32)
Calcium: 9.2 mg/dL (ref 8.9–10.3)
Chloride: 90 mmol/L — ABNORMAL LOW (ref 98–111)
Creatinine, Ser: 1.54 mg/dL — ABNORMAL HIGH (ref 0.44–1.00)
GFR calc Af Amer: 40 mL/min — ABNORMAL LOW (ref >60)
GFR calc non Af Amer: 35 mL/min — ABNORMAL LOW (ref >60)
Glucose, Bld: 354 mg/dL — ABNORMAL HIGH (ref 70–99)
Potassium: 3.9 mmol/L (ref 3.5–5.1)
Sodium: 132 mmol/L — ABNORMAL LOW (ref 135–145)
Total Bilirubin: 1 mg/dL (ref 0.3–1.2)
Total Protein: 7.1 g/dL (ref 6.5–8.1)

## 2020-03-17 LAB — CBC
HCT: 47.6 % — ABNORMAL HIGH (ref 36.0–46.0)
Hemoglobin: 15.9 g/dL — ABNORMAL HIGH (ref 12.0–15.0)
MCH: 30.9 pg (ref 26.0–34.0)
MCHC: 33.4 g/dL (ref 30.0–36.0)
MCV: 92.4 fL (ref 80.0–100.0)
Platelets: 194 10*3/uL (ref 150–400)
RBC: 5.15 MIL/uL — ABNORMAL HIGH (ref 3.87–5.11)
RDW: 13.5 % (ref 11.5–15.5)
WBC: 12.2 10*3/uL — ABNORMAL HIGH (ref 4.0–10.5)
nRBC: 0 % (ref 0.0–0.2)

## 2020-03-17 LAB — BLOOD GAS, VENOUS
Acid-Base Excess: 4.6 mmol/L — ABNORMAL HIGH (ref 0.0–2.0)
Bicarbonate: 25.9 mmol/L (ref 20.0–28.0)
FIO2: 28
O2 Saturation: 46.5 %
Patient temperature: 37
pCO2, Ven: 54.5 mmHg (ref 44.0–60.0)
pH, Ven: 7.356 (ref 7.250–7.430)
pO2, Ven: <31 mmHg — CL (ref 32.0–45.0)

## 2020-03-17 LAB — LIPASE, BLOOD: Lipase: 29 U/L (ref 11–51)

## 2020-03-17 LAB — SARS CORONAVIRUS 2 BY RT PCR (HOSPITAL ORDER, PERFORMED IN ~~LOC~~ HOSPITAL LAB): SARS Coronavirus 2: NEGATIVE

## 2020-03-17 LAB — BETA-HYDROXYBUTYRIC ACID: Beta-Hydroxybutyric Acid: 2.55 mmol/L — ABNORMAL HIGH (ref 0.05–0.27)

## 2020-03-17 MED ORDER — DEXTROSE 50 % IV SOLN: 0.0000 mL | INTRAVENOUS | Status: DC | PRN

## 2020-03-17 MED ORDER — ONDANSETRON HCL 4 MG/2ML IJ SOLN
4.0000 mg | Freq: Four times a day (QID) | INTRAMUSCULAR | Status: DC | PRN
  Administered 2020-03-18: 4 mg via INTRAVENOUS
  Filled 2020-03-17: qty 2

## 2020-03-17 MED ORDER — INSULIN REGULAR(HUMAN) IN NACL 100-0.9 UT/100ML-% IV SOLN: INTRAVENOUS | Status: DC

## 2020-03-17 MED ORDER — MORPHINE SULFATE (PF) 2 MG/ML IV SOLN
1.0000 mg | INTRAVENOUS | Status: DC | PRN
  Administered 2020-03-18 (×2): 1 mg via INTRAVENOUS
  Filled 2020-03-17 (×3): qty 1

## 2020-03-17 MED ORDER — SODIUM CHLORIDE 0.9 % IV BOLUS
1000.0000 mL | Freq: Once | INTRAVENOUS | Status: AC
  Administered 2020-03-17: 1000 mL via INTRAVENOUS

## 2020-03-17 MED ORDER — DEXTROSE-NACL 5-0.45 % IV SOLN: INTRAVENOUS | Status: DC

## 2020-03-17 MED ORDER — INSULIN REGULAR(HUMAN) IN NACL 100-0.9 UT/100ML-% IV SOLN
INTRAVENOUS | Status: DC
  Administered 2020-03-17: 13 [IU]/h via INTRAVENOUS
  Filled 2020-03-17: qty 100

## 2020-03-17 MED ORDER — HEPARIN SODIUM (PORCINE) 5000 UNIT/ML IJ SOLN
5000.0000 [IU] | Freq: Three times a day (TID) | INTRAMUSCULAR | Status: DC
  Administered 2020-03-17 – 2020-03-20 (×9): 5000 [IU] via SUBCUTANEOUS
  Filled 2020-03-17 (×9): qty 1

## 2020-03-17 MED ORDER — POTASSIUM CHLORIDE 10 MEQ/100ML IV SOLN
10.0000 meq | INTRAVENOUS | Status: AC
  Administered 2020-03-17 (×2): 10 meq via INTRAVENOUS
  Filled 2020-03-17 (×2): qty 100

## 2020-03-17 MED ORDER — POTASSIUM CHLORIDE 10 MEQ/100ML IV SOLN: 10.0000 meq | INTRAVENOUS | Status: AC

## 2020-03-17 MED ORDER — SODIUM CHLORIDE 0.9 % IV SOLN: INTRAVENOUS | Status: DC

## 2020-03-17 NOTE — ED Triage Notes (Signed)
Per EMS, pt reports emesis, diarrhea, and periumbilical pain. Pt reports did not take insulin this am, ems cbg en route 324.

## 2020-03-17 NOTE — ED Notes (Signed)
Attempted IV x 2 without success.  CN to assess for access.

## 2020-03-17 NOTE — ED Provider Notes (Signed)
[facility] EMERGENCY DEPARTMENT Provider Note   CSN: [id number] Arrival date & time: 03/17/20  1344     History Chief Complaint  Patient presents with  . Emesis    Claudia Keller is a 69 y.o. female with pertinent past medical history of COPD, hypertension, diabetes on insulin, tobacco use, hyperlipidemia that presents emergency department today via EMS for nausea, vomiting, diarrhea, abdominal pain.  Per EMS CBG in route was 324.  Patient states that she started feeling nauseous and vomited about 10 times this morning, nonbloody emesis, no coffee-ground emesis.  Patient states that she also started to have diarrhea this morning, no hematochezia, no melena.  Patient states that she is having generalized abdominal pain, denies any fevers, chills, back pain, chest pain, shortness of breath, dizziness, weakness.  Patient states that she took her Lantus last night, as prescribed.  Patient is also prescribed sliding scale insulin lispro, is supposed to take 15 to 24 units 3 times daily with meals.  Patient states that she has not been doing this, because she forgets.  Patient states that she has not been doing this for the past couple days.  Patient states that her blood sugar was in the 400s yesterday when she checked it at night.  States that it normally runs in the 200s.  Denies taking any insulin today.   HPI     Past Medical History:  Diagnosis Date  . Arthritis   . COPD (chronic obstructive pulmonary disease) ([place])   . Diverticulosis   . Essential hypertension, benign   . Hx of colonic polyps   . Hypothyroidism   . IBS (irritable bowel syndrome)   . Irritable bowel syndrome   . Ketoacidosis   . Mixed hyperlipidemia   . PTSD (post-traumatic stress disorder)   . S/P colonoscopy Jan 2011   RMR: pancolonic diverticula, polypoid rectal mucosa with  prominent lymphoid aggregates, repeat in Jan 2016   . Shortness of breath dyspnea   . Stress incontinence, female   . Thyroid disease     . Tobacco use   . Type 2 diabetes mellitus [facility])     Patient Active Problem List   Diagnosis Date Noted  . Bipolar II disorder ([place]) 04/17/2018  . PTSD (post-traumatic stress disorder) 04/17/2018  . Personal history of noncompliance with medical treatment, presenting hazards to health 08/17/2016  . Emesis 05/20/2016  . Uncontrolled diabetes mellitus with stage 2 chronic kidney disease ([place]) 09/08/2015  . Essential hypertension, benign 09/08/2015  . Primary hypothyroidism 09/08/2015  . Diverticulosis of colon without hemorrhage   . Nausea and vomiting 10/27/2011  . Chest pain 06/24/2011  . Hypercholesterolemia 06/24/2011  . Tobacco abuse 06/24/2011  . IRRITABLE BOWEL SYNDROME 11/12/2009  . DIARRHEA 11/12/2009  . OTHER SYMPTOMS INVOLVING DIGESTIVE SYSTEM OTHER 11/12/2009  . History of colonic polyps 11/12/2009  . CLOSED FRACTURE OF METATARSAL BONE 11/30/2007  . HIGH BLOOD PRESSURE 11/30/2007    Past Surgical History:  Procedure Laterality Date  . Bilateral tubal ligation    . BREAST BIOPSY Left 10/16/2014   negative  . CHOLECYSTECTOMY  09/11/2012   Procedure: LAPAROSCOPIC CHOLECYSTECTOMY;  Surgeon: Jamesetta So, MD;  Location: [facility] ORS;  Service: General;  Laterality: N/A;  . COLONOSCOPY  11/17/2009   Dr. Gala Romney: normal rectum, pancolonic diverticula, polyps benign, no adenomas.   . COLONOSCOPY N/A 02/12/2015   Dr. Gala Romney: colonic diverticulosis, multiple colonic polyps (tubular adenomas), negative segmental biopsies. Surveillance in 2021  . ESOPHAGOGASTRODUODENOSCOPY  11/23/2011   Dr.  Rourk: Erosive reflux esophagitis; small hiatal hernia; esophagus dilated empirically  . MALONEY DILATION  11/23/2011   Procedure: Venia Minks DILATION;  Surgeon: Daneil Dolin, MD;  Location: [facility] ENDO SUITE;  Service: Endoscopy;  Laterality: N/A;  . SKIN LESION EXCISION     on buttocks  . TUBAL LIGATION       OB History    Gravida  4   Para  4   Term  4   Preterm      AB      Living  4      SAB      TAB      Ectopic      Multiple      Live Births              Family History  Problem Relation Age of Onset  . Schizophrenia Brother   . Stroke Brother   . Cancer Mother        unsure what type  . Heart disease Father   . Heart attack Father   . Coronary artery disease Other   . Diabetes type II Other   . Cancer Son        bladder  . Colon cancer Neg Hx     Social History   Tobacco Use  . Smoking status: Current Every Day Smoker    Packs/day: 2.00    Years: 43.00    Pack years: 86.00    Types: Cigarettes  . Smokeless tobacco: Never Used  Substance Use Topics  . Alcohol use: No    Alcohol/week: 0.0 standard drinks  . Drug use: No    Home Medications Prior to Admission medications   Medication Sig Start Date End Date Taking? Authorizing Provider  amLODipine (NORVASC) 10 MG tablet Take 10 mg by mouth daily.    Yes [provider]  Calcium 600-200 MG-UNIT tablet Take 1 tablet by mouth daily.   Yes [provider]  colestipol (COLESTID) 1 g tablet TAKE 2 TABLETS DAILY FOR DIARRHEA. DO NOT TAKE WITHIN 2 HOURS OF OTHER ORAL MEDICATIONS. HOLD FOR CONSTIPATION. Patient taking differently: Take 2 g by mouth daily. *DO NOT TAKE WITHIN 2 HOURS OF OTHER ORAL MEDICATIONS-HOLD FOR CONSTIPATION 06/18/19  Yes Carlis Stable, NP  divalproex (DEPAKOTE) 250 MG DR tablet Take 1 tablet (250 mg total) by mouth daily. 11/29/19 03/08/22 Yes Nevada Crane, MD  furosemide (LASIX) 20 MG tablet Take 20 mg by mouth daily.    Yes [provider]  gabapentin (NEURONTIN) 100 MG capsule Take 100 mg by mouth 3 (three) times daily.    Yes [provider]  insulin glargine (LANTUS) 100 UNIT/ML injection Inject 80 Units into the skin at bedtime.    Yes [provider]  insulin lispro (HUMALOG) 100 UNIT/ML injection Inject 15-24 Units into the skin 3 (three) times daily with meals. Per sliding scale. 151-200=18 units; 201-250= 9 units; 251-300=20  units; 301-350=21 units; 351-400= 22 units; 401-450=23 units; 500-551=24 units.   Yes [provider]  levothyroxine (SYNTHROID) 200 MCG tablet Take 200 mcg by mouth daily before breakfast.    Yes [provider]  linagliptin (TRADJENTA) 5 MG TABS tablet Take 5 mg by mouth daily.    Yes [provider]  lisinopril (PRINIVIL,ZESTRIL) 20 MG tablet Take 20 mg by mouth daily.   Yes [provider]  loperamide (IMODIUM) 2 MG capsule Take by mouth as needed for diarrhea or loose stools.   Yes [provider]  metFORMIN (GLUCOPHAGE) 500 MG tablet Take 500 mg by mouth 2 (two) times daily.    Yes [provider]  mirtazapine (REMERON) 7.5 MG tablet Take 1 tablet (7.5 mg total) by mouth at bedtime. 11/29/19  Yes Nevada Crane, MD  naproxen sodium (ALEVE) 220 MG tablet Take 220-440 mg by mouth daily as needed (for pain).   Yes [provider]  ondansetron (ZOFRAN) 4 MG tablet Take 1 tablet (4 mg total) by mouth every 8 (eight) hours as needed for nausea or vomiting. 10/17/18  Yes Walden Field A, NP  pantoprazole (PROTONIX) 40 MG tablet TAKE 1 TABLET BY MOUTH ONCE DAILY 30 MINTUES BEFORE BREAKFAST. Patient taking differently: Take 40 mg by mouth daily before breakfast.  03/01/19  Yes Carlis Stable, NP  PARoxetine (PAXIL) 40 MG tablet Take 1 tablet (40 mg total) by mouth daily. 11/29/19  Yes Nevada Crane, MD  PROAIR HFA 108 [phone] Base) MCG/ACT inhaler Inhale 1-2 puffs into the lungs every 6 (six) hours as needed for wheezing or shortness of breath.  02/25/20  Yes [provider]  QUEtiapine (SEROQUEL) 100 MG tablet Take 1 tablet (100 mg total) by mouth at bedtime. 11/29/19  Yes Nevada Crane, MD  simvastatin (ZOCOR) 40 MG tablet Take 40 mg by mouth at bedtime.    Yes [provider]  divalproex (DEPAKOTE ER) 500 MG 24 hr tablet Take 1 tablet (500 mg total) by mouth every evening. Patient not taking: Reported on 03/17/2020 11/29/19   N[place] Crane, MD    Allergies    Patient has no known allergies.  Review of Systems   Review of Systems  Constitutional: Negative for chills, diaphoresis, fatigue and fever.  HENT: Negative for congestion, sore throat and trouble swallowing.   Eyes: Negative for pain and visual disturbance.  Respiratory: Negative for cough, shortness of breath and wheezing.   Cardiovascular: Negative for chest pain, palpitations and leg swelling.  Gastrointestinal: Positive for abdominal pain, diarrhea, nausea and vomiting. Negative for abdominal distention.  Genitourinary: Negative for difficulty urinating.  Musculoskeletal: Negative for back pain, neck pain and neck stiffness.  Skin: Negative for pallor.  Neurological: Negative for dizziness, speech difficulty, weakness and headaches.  Psychiatric/Behavioral: Negative for confusion.    Physical Exam Updated Vital Signs BP (!) 155/103   Pulse 99   Temp 97.9 F (36.6 C) (Oral)   Resp 17   Ht 5\' 6"  (1.676 m)   Wt 104.3 kg   SpO2 95%   BMI 37.12 kg/m   Physical Exam Constitutional:      General: She is not in acute distress.    Appearance: Normal appearance. She is not ill-appearing, toxic-appearing or diaphoretic.  HENT:     Mouth/Throat:     Mouth: Mucous membranes are dry.     Pharynx: Oropharynx is clear. No oropharyngeal exudate or posterior oropharyngeal erythema.  Eyes:     General: No scleral icterus.    Extraocular Movements: Extraocular movements intact.     Pupils: Pupils are equal, round, and reactive to light.  Cardiovascular:     Rate and Rhythm: Normal rate and regular rhythm.     Pulses: Normal pulses.     Heart sounds: Normal heart sounds.  Pulmonary:     Effort: Pulmonary effort is normal. No respiratory distress.     Breath sounds: Normal breath sounds. No stridor. No wheezing, rhonchi or rales.  Chest:     Chest wall: No tenderness.  Abdominal:     General: Abdomen  is flat. There is no distension.     Palpations:  Abdomen is soft.     Tenderness: There is no abdominal tenderness. There is no guarding or rebound.  Musculoskeletal:        General: No swelling or tenderness. Normal range of motion.     Cervical back: Normal range of motion and neck supple. No rigidity.     Right lower leg: No edema.     Left lower leg: No edema.  Skin:    General: Skin is warm and dry.     Capillary Refill: Capillary refill takes less than 2 seconds.     Coloration: Skin is not pale.  Neurological:     General: No focal deficit present.     Mental Status: She is alert and oriented to person, place, and time.     Cranial Nerves: No cranial nerve deficit.     Sensory: No sensory deficit.     Motor: No weakness.     Coordination: Coordination normal.  Psychiatric:        Mood and Affect: Mood normal.        Behavior: Behavior normal.     ED Results / Procedures / Treatments   Labs (all labs ordered are listed, but only abnormal results are displayed) Labs Reviewed  COMPREHENSIVE METABOLIC PANEL - Abnormal; Notable for the following components:      Result Value   Sodium 132 (*)    Chloride 90 (*)    Glucose, Bld 354 (*)    Creatinine, Ser 1.54 (*)    GFR calc non Af Amer 35 (*)    GFR calc Af Amer 40 (*)    Anion gap 17 (*)    All other components within normal limits  CBC - Abnormal; Notable for the following components:   WBC 12.2 (*)    RBC 5.15 (*)    Hemoglobin 15.9 (*)    HCT 47.6 (*)    All other components within normal limits  BLOOD GAS, VENOUS - Abnormal; Notable for the following components:   pO2, Ven <31.0 (*)    Acid-Base Excess 4.6 (*)    All other components within normal limits  BETA-HYDROXYBUTYRIC ACID - Abnormal; Notable for the following components:   Beta-Hydroxybutyric Acid 2.55 (*)    All other components within normal limits  CBG MONITORING, ED - Abnormal; Notable for the following components:   Glucose-Capillary 367 (*)    All other components within normal limits  CBG  MONITORING, ED - Abnormal; Notable for the following components:   Glucose-Capillary 334 (*)    All other components within normal limits  SARS CORONAVIRUS 2 BY RT PCR (HOSPITAL ORDER, [place] [facility])  LIPASE, BLOOD  URINALYSIS, ROUTINE W REFLEX MICROSCOPIC  BASIC METABOLIC PANEL  BASIC METABOLIC PANEL  BASIC METABOLIC PANEL  BASIC METABOLIC PANEL    EKG EKG Interpretation  Date/Time:  Monday Mar 17 2020 17:57:49 EDT Ventricular Rate:  92 PR Interval:    QRS Duration: 64 QT Interval:  402 QTC Calculation: 498 R Axis:   60 Text Interpretation: Sinus rhythm Atrial premature complexes Low voltage, precordial leads Borderline T abnormalities, inferior leads Borderline prolonged QT interval No significant change since prior 12/17 Confirmed by Aletta Edouard [phone]) on 03/17/2020 6:30:42 PM   Radiology No results found.  Procedures Procedures (including critical care time)  Medications Ordered in ED Medications  insulin regular, human (MYXREDLIN) 100 units/ 100 mL infusion (13 Units/hr Intravenous New Bag/Given  03/17/20 1946)  0.9 %  sodium chloride infusion (has no administration in time range)  dextrose 5 %-0.45 % sodium chloride infusion (has no administration in time range)  dextrose 50 % solution 0-50 mL (has no administration in time range)  potassium chloride 10 mEq in 100 mL IVPB ( Intravenous Rate/Dose Verify 03/17/20 2005)  sodium chloride 0.9 % bolus 1,000 mL (0 mLs Intravenous Stopped 03/17/20 1907)  sodium chloride 0.9 % bolus 1,000 mL (1,000 mLs Intravenous New Bag/Given 03/17/20 1937)    ED Course  I have reviewed the triage vital signs and the nursing notes.  Pertinent labs & imaging results that were available during my care of the patient were reviewed by me and considered in my medical decision making (see chart for details).  Clinical Course as of Mar 17 2012  Mon Mar 17, 2020  1807 Nurse called about VBG p02 <31, checked on pt, pt is  satting at 95%, no SOB. Will wait on urinalysis to see if pt is in DKA.    [SP]  755 69 year old diabetic here with nausea vomiting elevated blood sugars.  She is tired appearing but not toxic.  Labs showing some elements of DKA with elevated beta hydroxybutyrate and gap although pH is normal.  Getting fluids insulin drip and will need admission for further management   [MB]    Clinical Course User Index [MB] Hayden Rasmussen, MD [SP] Alfredia Client, PA-C   MDM Rules/Calculators/A&P                     YASINA DONDLINGER is a 69 y.o. female with pertinent past medical history of COPD, hypertension, diabetes on insulin, tobacco use, hyperlipidemia that presents emergency department today via EMS for nausea, vomiting, diarrhea, abdominal pain..The causes of generalized abdominal pain include but are not limited to AAA, mesenteric ischemia, appendicitis, diverticulitis, DKA, gastritis, gastroenteritis, AMI, nephrolithiasis, pancreatitis, peritonitis, adrenal insufficiency,lead poisoning, iron toxicity, intestinal ischemia, constipation, UTI,SBO/LBO, splenic rupture, biliary disease, IBD, IBS, PUD, or hepatitis.  No abdominal tenderness on exam.  Patient is alert and oriented, normal neuro exam.  Initial interventions IV fluids.  Does not want anything for pain or nausea.  Patient states that she is not nauseous anymore.  Labs are consistent with mild DKA.  Original gap 17 with beta hydroxybutyric acid 2.55, CBG 367.  VBG does not show acidosis at this time.  DKA order set initiated, potassium and insulin and IV fluids started.  Upon reassessment patient states that she feels the same, still denies any shortness of breath.  O2 sat 96.  Given the above findings, my suspicion is that patient is in DKA due to not taking her lispro as directed.  Doubt in DKA for any infectious reasons.  No signs of infection, however UA still pending.  Patient not admitting of any dysuria.  Patient is alert and oriented,  normal neuro exam.  Normal vitals, normal temp.  Will admit to hospitalist for DKA.  Marland Kitchen 8:13 PM discussed case with hospitalist, Dr. Humphrey Rolls, who agrees to accept care of patient.  The patient appears reasonably stabilized for admission considering the current resources, flow, and capabilities available in the ED at this time, and I doubt any other [facility] requiring further screening and/or treatment in the ED prior to admission.   The plan for this patient was discussed with Dr. Melina Copa, who voiced agreement and who oversaw evaluation and treatment of this patient.  Final Clinical Impression(s) / ED Diagnoses Final diagnoses:  Diabetic ketoacidosis without coma associated with type 2 diabetes mellitus (HCC)  Nausea vomiting and diarrhea    Rx / DC Orders ED Discharge Orders    None       Alfredia Client, PA-C 03/18/20 0920    Hayden Rasmussen, MD 03/18/20 1119

## 2020-03-17 NOTE — ED Notes (Signed)
Date and time results received: 03/17/20 1758 (use smartphrase ".now" to insert current time)  Test: pO2  Critical Value: <31  Name of Provider Notified: Posey Pronto, [place]  Orders Received? Or Actions Taken?: na

## 2020-03-18 ENCOUNTER — Inpatient Hospital Stay (HOSPITAL_COMMUNITY): Payer: Medicare Other

## 2020-03-18 DIAGNOSIS — E7800 Pure hypercholesterolemia, unspecified: Secondary | ICD-10-CM

## 2020-03-18 DIAGNOSIS — E1110 Type 2 diabetes mellitus with ketoacidosis without coma: Principal | ICD-10-CM

## 2020-03-18 DIAGNOSIS — Z91148 Patient's other noncompliance with medication regimen for other reason: Secondary | ICD-10-CM

## 2020-03-18 DIAGNOSIS — E1122 Type 2 diabetes mellitus with diabetic chronic kidney disease: Secondary | ICD-10-CM

## 2020-03-18 DIAGNOSIS — E1165 Type 2 diabetes mellitus with hyperglycemia: Secondary | ICD-10-CM

## 2020-03-18 DIAGNOSIS — N1831 Chronic kidney disease, stage 3a: Secondary | ICD-10-CM

## 2020-03-18 DIAGNOSIS — E1010 Type 1 diabetes mellitus with ketoacidosis without coma: Secondary | ICD-10-CM

## 2020-03-18 DIAGNOSIS — Z9114 Patient's other noncompliance with medication regimen: Secondary | ICD-10-CM

## 2020-03-18 DIAGNOSIS — I10 Essential (primary) hypertension: Secondary | ICD-10-CM

## 2020-03-18 DIAGNOSIS — N179 Acute kidney failure, unspecified: Secondary | ICD-10-CM

## 2020-03-18 DIAGNOSIS — N182 Chronic kidney disease, stage 2 (mild): Secondary | ICD-10-CM

## 2020-03-18 DIAGNOSIS — Z720 Tobacco use: Secondary | ICD-10-CM

## 2020-03-18 LAB — BASIC METABOLIC PANEL
Anion gap: 11 (ref 5–15)
Anion gap: 11 (ref 5–15)
BUN: 18 mg/dL (ref 8–23)
BUN: 17 mg/dL (ref 8–23)
CO2: 29 mmol/L (ref 22–32)
CO2: 28 mmol/L (ref 22–32)
Calcium: 8.3 mg/dL — ABNORMAL LOW (ref 8.9–10.3)
Calcium: 8.3 mg/dL — ABNORMAL LOW (ref 8.9–10.3)
Chloride: 101 mmol/L (ref 98–111)
Chloride: 99 mmol/L (ref 98–111)
Creatinine, Ser: 1.18 mg/dL — ABNORMAL HIGH (ref 0.44–1.00)
Creatinine, Ser: 1.11 mg/dL — ABNORMAL HIGH (ref 0.44–1.00)
GFR calc Af Amer: 55 mL/min — ABNORMAL LOW (ref >60)
GFR calc Af Amer: 60 mL/min — ABNORMAL LOW (ref >60)
GFR calc non Af Amer: 48 mL/min — ABNORMAL LOW (ref >60)
GFR calc non Af Amer: 51 mL/min — ABNORMAL LOW (ref >60)
Glucose, Bld: 101 mg/dL — ABNORMAL HIGH (ref 70–99)
Glucose, Bld: 139 mg/dL — ABNORMAL HIGH (ref 70–99)
Potassium: 3.8 mmol/L (ref 3.5–5.1)
Potassium: 3.6 mmol/L (ref 3.5–5.1)
Sodium: 141 mmol/L (ref 135–145)
Sodium: 138 mmol/L (ref 135–145)

## 2020-03-18 LAB — URINALYSIS, COMPLETE (UACMP) WITH MICROSCOPIC
Bilirubin Urine: NEGATIVE
Glucose, UA: NEGATIVE mg/dL
Ketones, ur: NEGATIVE mg/dL
Nitrite: POSITIVE — AB
Protein, ur: NEGATIVE mg/dL
Specific Gravity, Urine: 1.01 (ref 1.005–1.030)
pH: 7 (ref 5.0–8.0)

## 2020-03-18 LAB — CBC
HCT: 42.6 % (ref 36.0–46.0)
Hemoglobin: 14.3 g/dL (ref 12.0–15.0)
MCH: 31.4 pg (ref 26.0–34.0)
MCHC: 33.6 g/dL (ref 30.0–36.0)
MCV: 93.6 fL (ref 80.0–100.0)
Platelets: 162 10*3/uL (ref 150–400)
RBC: 4.55 MIL/uL (ref 3.87–5.11)
RDW: 13.9 % (ref 11.5–15.5)
WBC: 13.4 10*3/uL — ABNORMAL HIGH (ref 4.0–10.5)
nRBC: 0 % (ref 0.0–0.2)

## 2020-03-18 LAB — MAGNESIUM: Magnesium: 1.1 mg/dL — ABNORMAL LOW (ref 1.7–2.4)

## 2020-03-18 LAB — GLUCOSE, CAPILLARY
Glucose-Capillary: 137 mg/dL — ABNORMAL HIGH (ref 70–99)
Glucose-Capillary: 178 mg/dL — ABNORMAL HIGH (ref 70–99)
Glucose-Capillary: 96 mg/dL (ref 70–99)
Glucose-Capillary: 147 mg/dL — ABNORMAL HIGH (ref 70–99)

## 2020-03-18 LAB — CBG MONITORING, ED
Glucose-Capillary: 120 mg/dL — ABNORMAL HIGH (ref 70–99)
Glucose-Capillary: 141 mg/dL — ABNORMAL HIGH (ref 70–99)
Glucose-Capillary: 142 mg/dL — ABNORMAL HIGH (ref 70–99)
Glucose-Capillary: 89 mg/dL (ref 70–99)

## 2020-03-18 LAB — MRSA PCR SCREENING: MRSA by PCR: NEGATIVE

## 2020-03-18 LAB — HEMOGLOBIN A1C
Hgb A1c MFr Bld: 12.3 % — ABNORMAL HIGH (ref 4.8–5.6)
Mean Plasma Glucose: 306.31 mg/dL

## 2020-03-18 IMAGING — CT CT ABD-PELV W/O CM
2 of 3 series · 16 of 46 positions shown, 18 images · non-contrast
Comparison: None.

CLINICAL DATA: Emesis, diarrhea.  COPD

EXAM:
CT ABDOMEN AND PELVIS WITHOUT CONTRAST
TECHNIQUE: Multidetector CT imaging of the abdomen and pelvis was performed
following the standard protocol without IV contrast.

[Series 2: axial st · axial · 0.66mm/px · z∈[+903,+1303]mm · 13 of 92 slices shown, 15 images]
[im 6/92  soft-tissue]
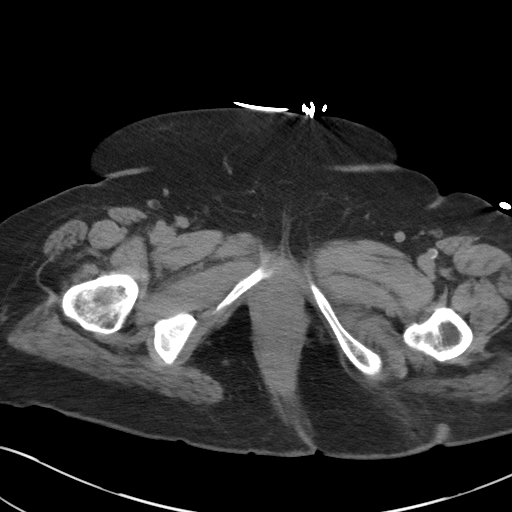
[im 6/92  bone]
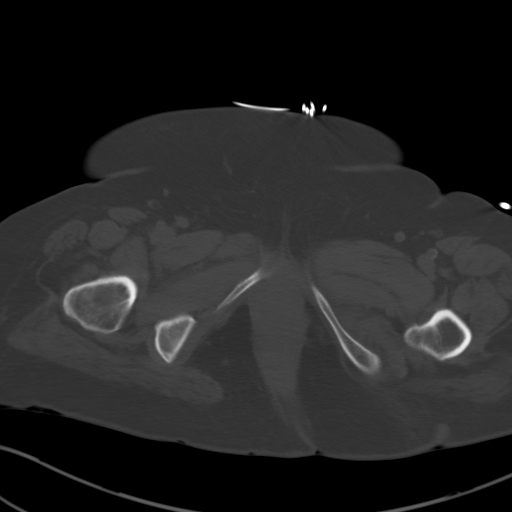
[im 12/92  soft-tissue]
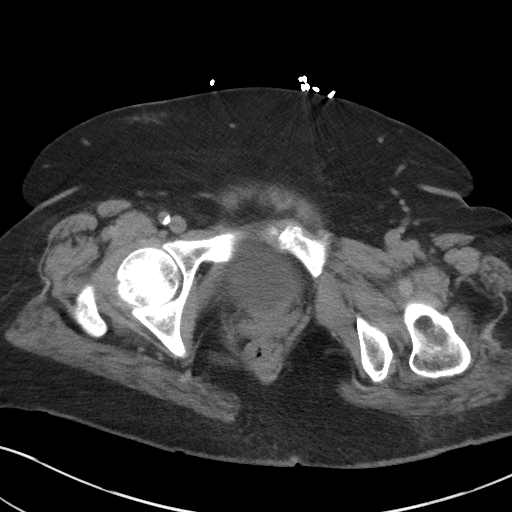
[im 18/92  soft-tissue]
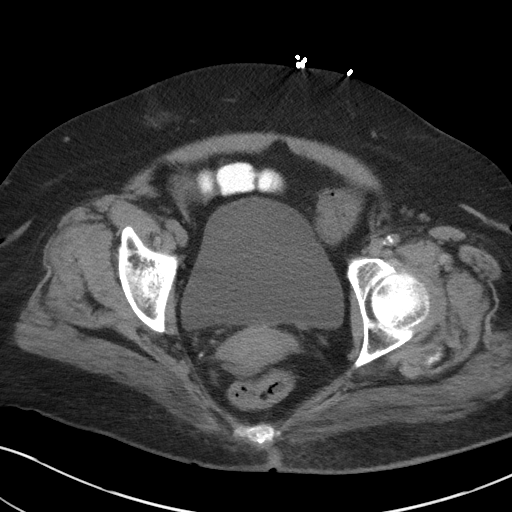
[im 27/92  soft-tissue]
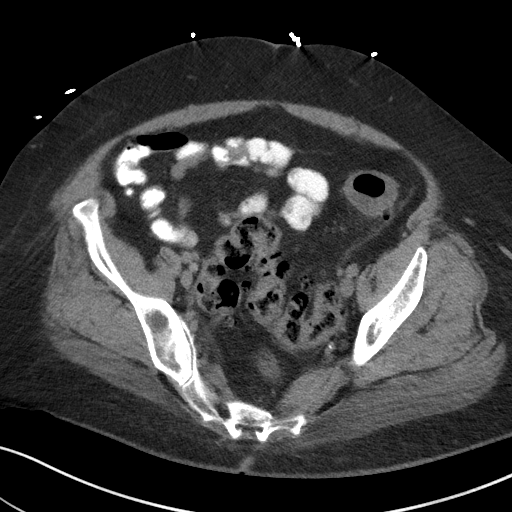
[im 33/92  soft-tissue]
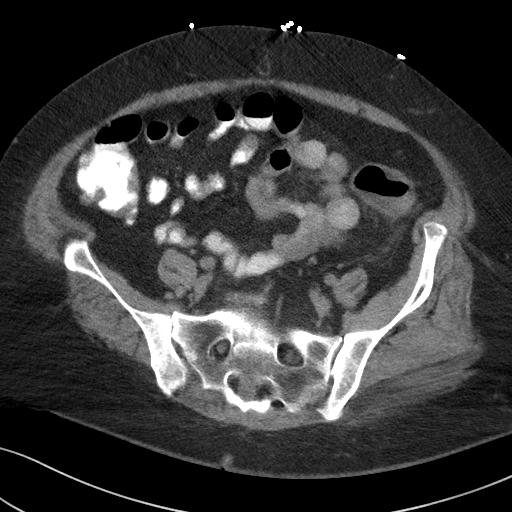
[im 39/92  soft-tissue]
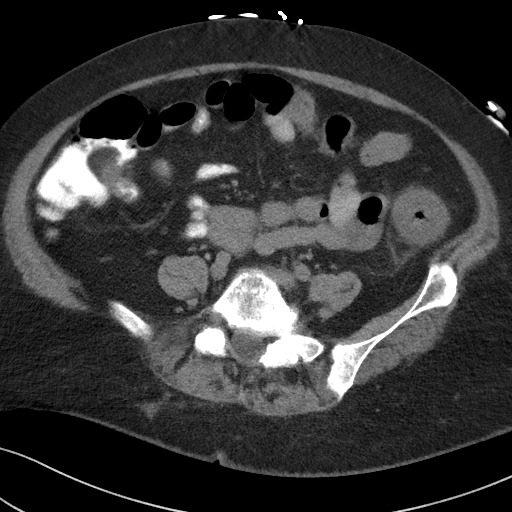
[im 47/92  soft-tissue]
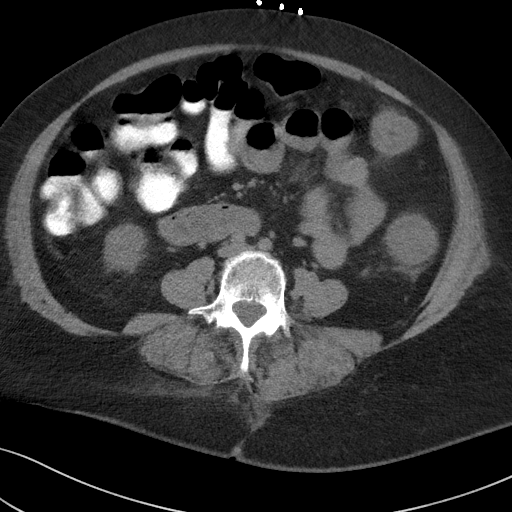
[im 53/92  soft-tissue]
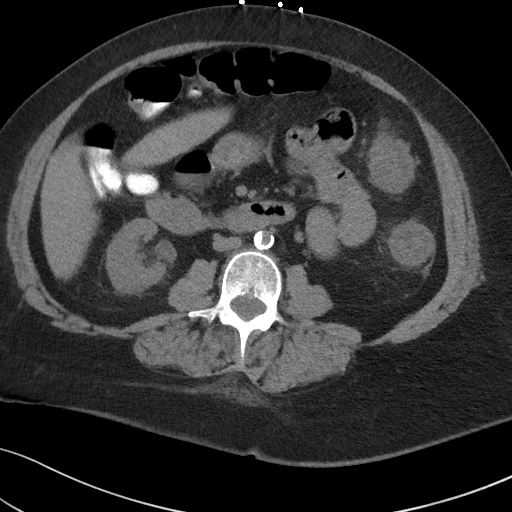
[im 59/92  soft-tissue]
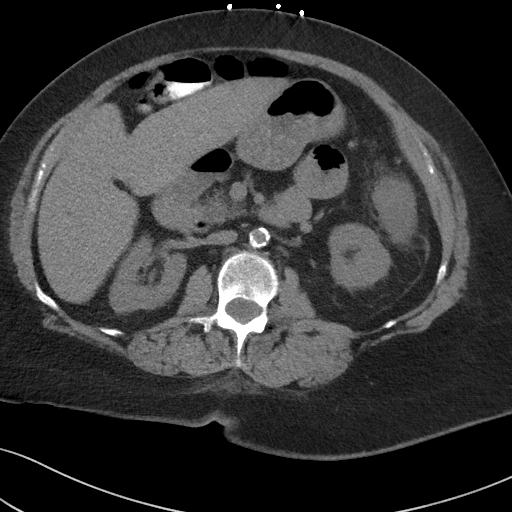
[im 59/92  bone]
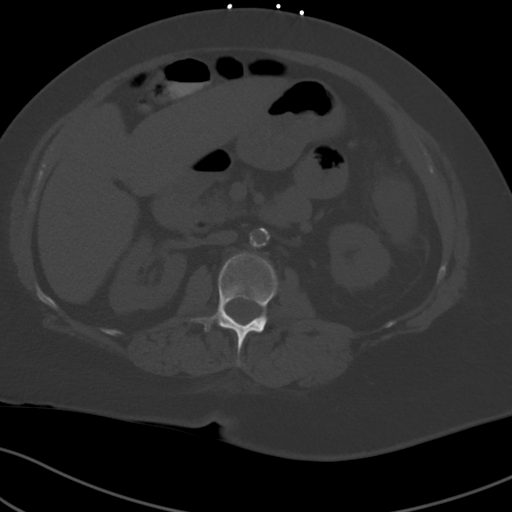
[im 65/92  soft-tissue]
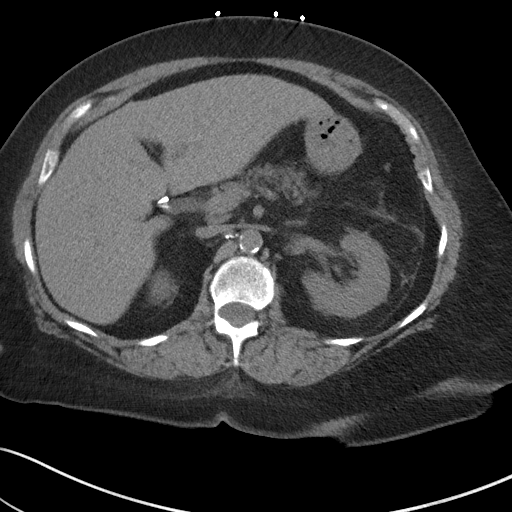
[im 74/92  soft-tissue]
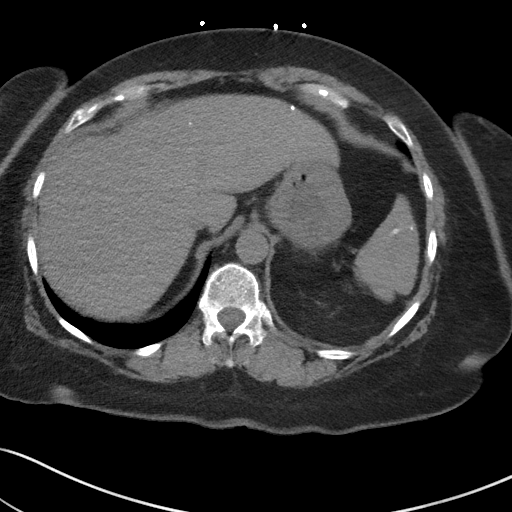
[im 80/92  soft-tissue]
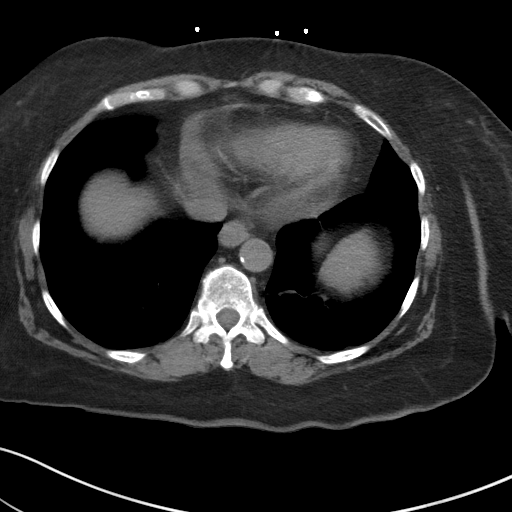
[im 86/92  soft-tissue]
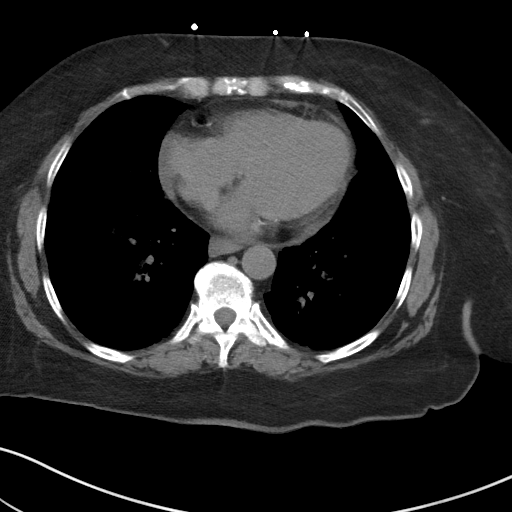

[Series 5: coronal st · coronal · 0.79mm/px · 3 of 143 slices shown]
[im 48/143  soft-tissue]
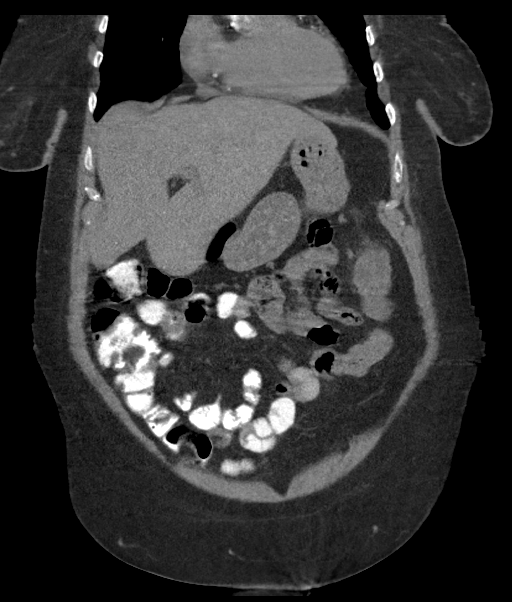
[im 64/143  soft-tissue]
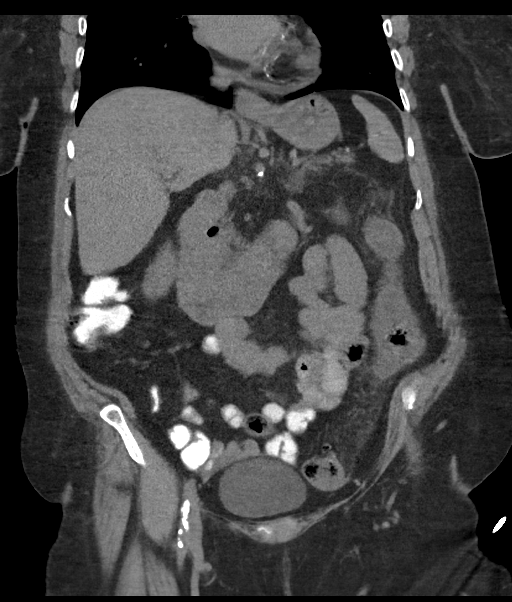
[im 79/143  soft-tissue]
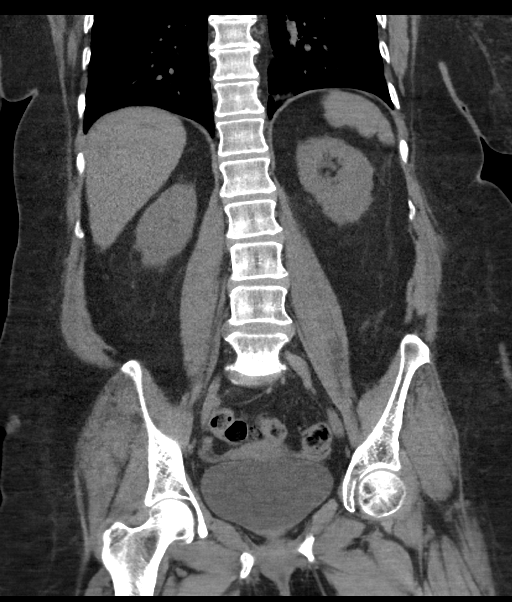

[16 of 46 positions shown; findings below may reference images not displayed]

FINDINGS: Lower chest: Linear thickening at the LEFT lung base is chronic.

Hepatobiliary: No focal hepatic lesion. Postcholecystectomy. No
biliary dilatation.

Pancreas: Pancreas is normal. No ductal dilatation. No pancreatic
inflammation.

Spleen: Normal spleen

Adrenals/urinary tract: Adrenal glands and kidneys are normal. The
ureters and bladder normal.

Stomach/Bowel: Stomach, small bowel, appendix, and cecum are normal.
Ascending and transverse colon normal. Beginning at the splenic
flexure and extending to the proximal sigmoid colon there is
submucosal bowel wall edema and thickening (image [DATE]/sagittal).
Small amount pericolonic fluid extends along the LEFT pericolic
gutter (image 45/2). The greatest submucosal edema is seen at the
splenic flexure (image 36/2). There is mild stranding in the
pericolonic mesentery and fat.

There are no diverticula through this region to suggest acute
diverticulitis. No pneumatosis or portal venous gas. No perforation

Sigmoid colon contains diverticula without acute infection or
inflammation. Rectum normal.

Vascular/Lymphatic: Dense calcification of the abdominal aorta which
is nonaneurysmal. There is calcification of the ostia of the SMA and
celiac trunk. IMA is normal noncontrast

Reproductive: Uterus and adnexa unremarkable.

Other: No free fluid.

Musculoskeletal: No aggressive osseous lesion.
IMPRESSION: 1. Segment of submucosal edema and pericolonic stranding involving
the splenic flexure and descending colon. Findings consistent with
segmental colitis. Differential would include ischemic colitis
(particularly at this vascular watershed location), inflammatory
bowel disease, and drug induced colitis.
2. No pneumatosis, portal venous gas or intraperitoneal free air.
3. Aortic Atherosclerosis ([MV]-[MV]).

## 2020-03-18 MED ORDER — INSULIN ASPART 100 UNIT/ML ~~LOC~~ SOLN
10.0000 [IU] | Freq: Once | SUBCUTANEOUS | Status: AC
  Administered 2020-03-18: 10 [IU] via SUBCUTANEOUS
  Filled 2020-03-18: qty 1

## 2020-03-18 MED ORDER — DIVALPROEX SODIUM 250 MG PO DR TAB
500.0000 mg | DELAYED_RELEASE_TABLET | Freq: Every day | ORAL | Status: DC
  Administered 2020-03-18 – 2020-03-19 (×2): 500 mg via ORAL
  Filled 2020-03-18 (×2): qty 2

## 2020-03-18 MED ORDER — INSULIN ASPART 100 UNIT/ML ~~LOC~~ SOLN: 0.0000 [IU] | Freq: Every day | SUBCUTANEOUS | Status: DC

## 2020-03-18 MED ORDER — POTASSIUM CHLORIDE IN NACL 20-0.9 MEQ/L-% IV SOLN: INTRAVENOUS | Status: DC

## 2020-03-18 MED ORDER — MAGNESIUM SULFATE 4 GM/100ML IV SOLN
4.0000 g | Freq: Once | INTRAVENOUS | Status: AC
  Administered 2020-03-18: 4 g via INTRAVENOUS
  Filled 2020-03-18: qty 100

## 2020-03-18 MED ORDER — INSULIN GLARGINE 100 UNIT/ML ~~LOC~~ SOLN
20.0000 [IU] | Freq: Every day | SUBCUTANEOUS | Status: DC
  Administered 2020-03-18 – 2020-03-20 (×3): 20 [IU] via SUBCUTANEOUS
  Filled 2020-03-18 (×5): qty 0.2

## 2020-03-18 MED ORDER — SODIUM CHLORIDE 0.9 % IV SOLN
2.0000 g | INTRAVENOUS | Status: DC
  Administered 2020-03-18: 2 g via INTRAVENOUS
  Filled 2020-03-18: qty 20

## 2020-03-18 MED ORDER — NICOTINE 21 MG/24HR TD PT24
21.0000 mg | MEDICATED_PATCH | Freq: Every day | TRANSDERMAL | Status: DC
  Administered 2020-03-20: 21 mg via TRANSDERMAL
  Filled 2020-03-18 (×4): qty 1

## 2020-03-18 MED ORDER — ATORVASTATIN CALCIUM 20 MG PO TABS
20.0000 mg | ORAL_TABLET | Freq: Every day | ORAL | Status: DC
  Administered 2020-03-18 – 2020-03-20 (×3): 20 mg via ORAL
  Filled 2020-03-18 (×3): qty 1

## 2020-03-18 MED ORDER — METRONIDAZOLE IN NACL 5-0.79 MG/ML-% IV SOLN
500.0000 mg | Freq: Three times a day (TID) | INTRAVENOUS | Status: DC
  Administered 2020-03-18 – 2020-03-19 (×3): 500 mg via INTRAVENOUS
  Filled 2020-03-18 (×3): qty 100

## 2020-03-18 MED ORDER — LEVOTHYROXINE SODIUM 100 MCG PO TABS
200.0000 ug | ORAL_TABLET | Freq: Every day | ORAL | Status: DC
  Administered 2020-03-18 – 2020-03-20 (×3): 200 ug via ORAL
  Filled 2020-03-18 (×4): qty 2

## 2020-03-18 MED ORDER — AMLODIPINE BESYLATE 5 MG PO TABS
5.0000 mg | ORAL_TABLET | Freq: Every day | ORAL | Status: DC
  Administered 2020-03-18 – 2020-03-20 (×3): 5 mg via ORAL
  Filled 2020-03-18 (×3): qty 1

## 2020-03-18 MED ORDER — CHLORHEXIDINE GLUCONATE CLOTH 2 % EX PADS
6.0000 | MEDICATED_PAD | Freq: Every day | CUTANEOUS | Status: DC
  Administered 2020-03-18: 6 via TOPICAL

## 2020-03-18 MED ORDER — PAROXETINE HCL 20 MG PO TABS
40.0000 mg | ORAL_TABLET | Freq: Every day | ORAL | Status: DC
  Administered 2020-03-18 – 2020-03-20 (×3): 40 mg via ORAL
  Filled 2020-03-18 (×3): qty 2

## 2020-03-18 MED ORDER — IOHEXOL 9 MG/ML PO SOLN
ORAL | Status: AC
  Filled 2020-03-18: qty 1000

## 2020-03-18 MED ORDER — INSULIN ASPART 100 UNIT/ML ~~LOC~~ SOLN
0.0000 [IU] | Freq: Three times a day (TID) | SUBCUTANEOUS | Status: DC
  Administered 2020-03-18: 3 [IU] via SUBCUTANEOUS
  Administered 2020-03-18: 4 [IU] via SUBCUTANEOUS
  Administered 2020-03-19 (×3): 3 [IU] via SUBCUTANEOUS
  Administered 2020-03-20 (×2): 4 [IU] via SUBCUTANEOUS
  Administered 2020-03-20: 3 [IU] via SUBCUTANEOUS

## 2020-03-18 NOTE — ED Notes (Signed)
Hospitalist notified about blood sugars. Verbal order for SQ insulin received

## 2020-03-18 NOTE — ED Notes (Signed)
hospitalist notified of updated labs and glucose

## 2020-03-18 NOTE — Progress Notes (Signed)
PROGRESS NOTE  Claudia Keller [id number] DOB: Apr 06, 1951 DOA: 03/17/2020 PCP: Rosita Fire, MD  Brief History:  69 year old female with a history of bipolar disorder, diabetes mellitus type 2, hypertension, hypothyroidism, hyperlipidemia, tobacco abuse, COPD presenting with nausea and vomiting that began on the morning of 03/17/2020.  Patient denies any fevers, chills, headache, dysuria, hematuria, hematochezia, melena.  She has not had any travels.  She denies any sick contacts.  She has not eaten any raw or undercooked foods.  She states that she has been poorly compliant with her Lantus as well as her lispro.  She states that she misses at least 3 days of Lantus on a weekly basis and only takes the lispro once a day.  She states that she often forgets her dosing and falls asleep.  She cannot recall the last time she took her Lantus.  She complains of periumbilical abdominal pain that began around the same time of her vomiting.  There is no hematochezia, melena.  She denies any new medications.  She appears to have poor insight regarding her medical issues.  The patient lives by herself and uses a cane for assistance to ambulate.  She has a home health aide that comes Monday through Friday for 5 hours. In the emergency department, the patient was afebrile hemodynamically stable with oxygen saturation 96% room air.  BMP showed a sodium 132, serum creatinine 1.54 with anion gap 17.  Initial serum glucose was 354.  WBC 12.2, hemoglobin 15.9, platelets 194,000.  UA showed ketonuria.  Beta hydroxy acid was 2.55.  The patient was started on fluid resuscitation and IV insulin.  She was transitioned to Lantus.  Assessment/Plan: DKA type II -patient started on IV insulin with q 1 hour CBG check and q 4 hour BMPs -pt started on aggressive fluid resuscitation -Electrolytes were monitored and repleted -transitioned to [place] insulin once anion gap closed -diet was advanced once anion gap  closed -HbA1C--  Pyuria -CT abd/pelvis with abd pain and leukocytosis -urine culture  Poor compliance -The patient has poor insight regarding her medical issues -She will benefit from home health RN upon discharge  Acute on chronic renal failure--CKD stage IIIa -Continue IV fluids -Secondary to volume depletion  Essential hypertension -Continue amlodipine -Holding lisinopril and furosemide secondary to acute on chronic renal failure  Uncontrolled diabetes mellitus type 2 with hyperglycemia -Holding metformin -Hemoglobin A1c -Start reduced dose Lantus -Holding Tradjenta  Anxiety/depression -Restart Paxil, Depakote, Remeron  Hypothyroidism -Continue Synthroid  Tobacco abuse -Tobacco cessation discussed -NicoDerm patch  Hyperlipidemia -Continue statin       Disposition Plan: Patient From: Hom D/C Place: Home - 1-2  Days Barriers: Not Clinically Stable--not tolerating diet, abdominal pain persistent  Family Communication:  No Family at bedside  Consultants:  none  Code Status:  FULL  DVT Prophylaxis:  [place] Heparin    Procedures: As Listed in Progress Note Above  Antibiotics: None   Total time spent 35 minutes.  Greater than 50% spent face to face counseling and coordinating care.      Subjective: Patient continues to complain of abdominal pain with nausea.  She denies any fevers, chills, headache, chest pain, shortness breath, diarrhea, hematochezia, melena, dysuria, hematuria.  Objective: Vitals:   03/18/20 0645 03/18/20 0700 03/18/20 0746 03/18/20 0746  BP:  (!) 145/65  (!) 147/63  Pulse:    90  Resp: (!) 23 16  18   Temp:   99.8 F (37.7 C)  99.8 F (37.7 C)  TempSrc:    Oral  SpO2:    90%  Weight:      Height:        Intake/Output Summary (Last 24 hours) at 03/18/2020 0800 Last data filed at 03/17/2020 2213 Gross per 24 hour  Intake 2230.49 ml  Output --  Net 2230.49 ml   Weight change:  Exam:   General:  Pt is alert,  follows commands appropriately, not in acute distress  HEENT: No icterus, No thrush, No neck mass, [place]/AT  Cardiovascular: RRR, S1/S2, no rubs, no gallops  Respiratory: Diminished breath sounds bilateral.  Bibasilar rales but no wheezing.  Abdomen: Soft/+BS, periumbilical tender, non distended, no guarding  Extremities: No edema, No lymphangitis, No petechiae, No rashes, no synovitis   Data Reviewed: I have personally reviewed following labs and imaging studies Basic Metabolic Panel: Recent Labs  Lab 03/17/20 1422 03/17/20 1934 03/17/20 2305 03/18/20 0337 03/18/20 0731  NA 132* 135 139 141 138  K 3.9 3.9 3.7 3.8 3.6  CL 90* 97* 102 101 99  CO2 25 23 26 29 28   GLUCOSE 354* 350* 155* 101* 139*  BUN 21 21 19 18 17   CREATININE 1.54* 1.32* 1.22* 1.18* 1.11*  CALCIUM 9.2 8.7* 8.3* 8.3* 8.3*  MG  --   --   --   --  1.1*   Liver Function Tests: Recent Labs  Lab 03/17/20 1422  AST 19  ALT 19  ALKPHOS 70  BILITOT 1.0  PROT 7.1  ALBUMIN 4.1   Recent Labs  Lab 03/17/20 1422  LIPASE 29   No results for input(s): AMMONIA in the last 168 hours. Coagulation Profile: No results for input(s): INR, PROTIME in the last 168 hours. CBC: Recent Labs  Lab 03/17/20 1422  WBC 12.2*  HGB 15.9*  HCT 47.6*  MCV 92.4  PLT 194   Cardiac Enzymes: No results for input(s): CKTOTAL, CKMB, CKMBINDEX, TROPONINI in the last 168 hours. BNP: Invalid input(s): POCBNP CBG: Recent Labs  Lab 03/17/20 2315 03/18/20 0010 03/18/20 0118 03/18/20 0414 03/18/20 0624  GLUCAP 139* 141* 142* 89 120*   HbA1C: No results for input(s): HGBA1C in the last 72 hours. Urine analysis:    Component Value Date/Time   COLORURINE AMBER (A) 03/17/2020 1937   APPEARANCEUR TURBID (A) 03/17/2020 1937   LABSPEC 1.018 03/17/2020 1937   PHURINE 5.0 03/17/2020 1937   GLUCOSEU >=500 (A) 03/17/2020 1937   HGBUR LARGE (A) 03/17/2020 1937   BILIRUBINUR NEGATIVE 03/17/2020 1937   KETONESUR 20 (A) 03/17/2020  1937   PROTEINUR 30 (A) 03/17/2020 1937   UROBILINOGEN 1.0 01/10/2015 1754   NITRITE NEGATIVE 03/17/2020 1937   LEUKOCYTESUR TRACE (A) 03/17/2020 1937   Sepsis Labs: @LABRCNTIP (procalcitonin:4,lacticidven:4) ) Recent Results (from the past 240 hour(s))  SARS Coronavirus 2 by RT PCR (hospital order, performed in [place] hospital lab) Nasopharyngeal Nasopharyngeal Swab     Status: None   Collection Time: 03/17/20  7:29 PM   Specimen: Nasopharyngeal Swab  Result Value Ref Range Status   SARS Coronavirus 2 NEGATIVE NEGATIVE Final    Comment: (NOTE) SARS-CoV-2 target nucleic acids are NOT DETECTED. The SARS-CoV-2 RNA is generally detectable in upper and lower respiratory specimens during the acute phase of infection. The lowest concentration of SARS-CoV-2 viral copies this assay can detect is 250 copies / mL. A negative result does not preclude SARS-CoV-2 infection and should not be used as the sole basis for treatment or other patient management decisions.  A  negative result may occur with improper specimen collection / handling, submission of specimen other than nasopharyngeal swab, presence of viral mutation(s) within the areas targeted by this assay, and inadequate number of viral copies (<250 copies / mL). A negative result must be combined with clinical observations, patient history, and epidemiological information. Fact Sheet for Patients:   StrictlyIdeas.no Fact Sheet for Healthcare Providers: BankingDealers.co.za This test is not yet approved or cleared  by the [place] FDA and has been authorized for detection and/or diagnosis of SARS-CoV-2 by FDA under an Emergency Use Authorization (EUA).  This EUA will remain in effect (meaning this test can be used) for the duration of the COVID-19 declaration under Section 564(b)(1) of the Act, 21 U.S.C. section 360bbb-3(b)(1), unless the authorization is terminated or revoked  sooner. Performed at [facility], [address], [place], [place] [address]      Scheduled Meds: . heparin  5,000 Units Subcutaneous Q8H  . insulin aspart  0-20 Units Subcutaneous TID WC  . insulin aspart  0-5 Units Subcutaneous QHS  . insulin glargine  20 Units Subcutaneous Daily  . nicotine  21 mg Transdermal Daily   Continuous Infusions: . sodium chloride    . sodium chloride Stopped (03/17/20 2320)  . 0.9 % NaCl with KCl 20 mEq / L    . dextrose 5 % and 0.45% NaCl    . dextrose 5 % and 0.45% NaCl 75 mL/hr at 03/17/20 2230    Procedures/Studies: No results found.  Orson Eva, DO  [facility]  If 7PM-7AM, please contact night-coverage [email or web address] Password [email or web address] 03/18/2020, 8:00 AM   LOS: 1 day

## 2020-03-18 NOTE — H&P (Signed)
History and Physical    Claudia Keller [id number] DOB: 02/09/1951 DOA: 03/17/2020  PCP: Rosita Fire, MD (Confirm with patient/family/NH records and if not entered, this has to be entered at [facility] point of entry) Patient coming from: Home  I have personally briefly reviewed patient's old medical records in [place]  Chief Complaint: Intractable abdominal pain with nausea vomiting and diarrhea  HPI: Claudia Keller is a 69 y.o. female with medical history significant of hypertension, hyperlipidemia, diabetes mellitus requiring insulin, COPD, hypothyroidism [name] presented to ED for evaluation of nausea vomiting diarrhea and abdominal pain.  Patient states that she had multiple episodes of nonbloody vomiting this morning and also had 2 episodes of diarrhea without hematochezia or melena.  Patient states that she is feeling very weak and having generalized muscular pain but denies fever, chills, chest pain, shortness of breath and urinary symptoms.  Patient further reported that she has not been compliant with her insulin regimen as prescribed because she forgets and her blood glucose level was 400 yesterday when she checked it at night and most of the time it runs above 200.  Patient also admits of having epigastric abdominal pain that is improved in intensity at this time.  ED Course: On arrival to the ED patient had temperature of 97.9, blood pressure 125/78, heart rate 95, respiratory rate 18 and oxygen saturation 95% on room air.  Blood work showed WBC 12.2, hemoglobin 15.9, sodium 132, potassium 3.9, BUN 21, creatinine 1.54, blood glucose 354, anion gap 17 and serum ketones positive.  Patient was started on IV fluids and insulin drip as per DKA protocol in the ED.  Review of Systems: As per HPI otherwise 10 point review of systems negative.  Unacceptable ROS statements: "10 systems reviewed," "Extensive" (without elaboration).  Acceptable ROS statements: "All others negative," "All  others reviewed and are negative," and "All others unremarkable," with at [place] documented Can't double dip - if using for HPI can't use for ROS  Past Medical History:  Diagnosis Date  . Arthritis   . COPD (chronic obstructive pulmonary disease) ([place])   . Diverticulosis   . Essential hypertension, benign   . Hx of colonic polyps   . Hypothyroidism   . IBS (irritable bowel syndrome)   . Irritable bowel syndrome   . Ketoacidosis   . Mixed hyperlipidemia   . PTSD (post-traumatic stress disorder)   . S/P colonoscopy Jan 2011   RMR: pancolonic diverticula, polypoid rectal mucosa with  prominent lymphoid aggregates, repeat in Jan 2016   . Shortness of breath dyspnea   . Stress incontinence, female   . Thyroid disease   . Tobacco use   . Type 2 diabetes mellitus ([place])     Past Surgical History:  Procedure Laterality Date  . Bilateral tubal ligation    . BREAST BIOPSY Left 10/16/2014   negative  . CHOLECYSTECTOMY  09/11/2012   Procedure: LAPAROSCOPIC CHOLECYSTECTOMY;  Surgeon: Jamesetta So, MD;  Location: [facility] ORS;  Service: General;  Laterality: N/A;  . COLONOSCOPY  11/17/2009   Dr. Gala Romney: normal rectum, pancolonic diverticula, polyps benign, no adenomas.   . COLONOSCOPY N/A 02/12/2015   Dr. Gala Romney: colonic diverticulosis, multiple colonic polyps (tubular adenomas), negative segmental biopsies. Surveillance in 2021  . ESOPHAGOGASTRODUODENOSCOPY  11/23/2011   Dr. Gala Romney: Erosive reflux esophagitis; small hiatal hernia; esophagus dilated empirically  . MALONEY DILATION  11/23/2011   Procedure: Venia Minks DILATION;  Surgeon: Daneil Dolin, MD;  Location: [facility] ENDO SUITE;  Service: Endoscopy;  Laterality: N/A;  . SKIN LESION EXCISION     on buttocks  . TUBAL LIGATION       reports that she has been smoking cigarettes. She has a 86.00 pack-year smoking history. She has never used smokeless tobacco. She reports that she does not drink alcohol or use drugs.  No Known Allergies  Family  History  Problem Relation Age of Onset  . Schizophrenia Brother   . Stroke Brother   . Cancer Mother        unsure what type  . Heart disease Father   . Heart attack Father   . Coronary artery disease Other   . Diabetes type II Other   . Cancer Son        bladder  . Colon cancer Neg Hx     Unacceptable: Noncontributory, unremarkable, or negative. Acceptable: (example)Family history negative for heart disease  Prior to Admission medications   Medication Sig Start Date End Date Taking? Authorizing Provider  amLODipine (NORVASC) 10 MG tablet Take 10 mg by mouth daily.    Yes [provider]  Calcium 600-200 MG-UNIT tablet Take 1 tablet by mouth daily.   Yes [provider]  colestipol (COLESTID) 1 g tablet TAKE 2 TABLETS DAILY FOR DIARRHEA. DO NOT TAKE WITHIN 2 HOURS OF OTHER ORAL MEDICATIONS. HOLD FOR CONSTIPATION. Patient taking differently: Take 2 g by mouth daily. *DO NOT TAKE WITHIN 2 HOURS OF OTHER ORAL MEDICATIONS-HOLD FOR CONSTIPATION 06/18/19  Yes Carlis Stable, NP  divalproex (DEPAKOTE) 250 MG DR tablet Take 1 tablet (250 mg total) by mouth daily. 11/29/19 03/08/22 Yes [place] Crane, MD  furosemide (LASIX) 20 MG tablet Take 20 mg by mouth daily.    Yes [provider]  gabapentin (NEURONTIN) 100 MG capsule Take 100 mg by mouth 3 (three) times daily.    Yes [provider]  insulin glargine (LANTUS) 100 UNIT/ML injection Inject 80 Units into the skin at bedtime.    Yes [provider]  insulin lispro (HUMALOG) 100 UNIT/ML injection Inject 15-24 Units into the skin 3 (three) times daily with meals. Per sliding scale. 151-200=18 units; 201-250= 9 units; 251-300=20 units; 301-350=21 units; 351-400= 22 units; 401-450=23 units; 500-551=24 units.   Yes [provider]  levothyroxine (SYNTHROID) 200 MCG tablet Take 200 mcg by mouth daily before breakfast.    Yes [provider]  linagliptin (TRADJENTA) 5 MG TABS tablet Take 5 mg  by mouth daily.    Yes [provider]  lisinopril (PRINIVIL,ZESTRIL) 20 MG tablet Take 20 mg by mouth daily.   Yes [provider]  loperamide (IMODIUM) 2 MG capsule Take by mouth as needed for diarrhea or loose stools.   Yes [provider]  metFORMIN (GLUCOPHAGE) 500 MG tablet Take 500 mg by mouth 2 (two) times daily.    Yes [provider]  mirtazapine (REMERON) 7.5 MG tablet Take 1 tablet (7.5 mg total) by mouth at bedtime. 11/29/19  Yes [place] Crane, MD  naproxen sodium (ALEVE) 220 MG tablet Take 220-440 mg by mouth daily as needed (for pain).   Yes [provider]  ondansetron (ZOFRAN) 4 MG tablet Take 1 tablet (4 mg total) by mouth every 8 (eight) hours as needed for nausea or vomiting. 10/17/18  Yes Walden Field A, NP  pantoprazole (PROTONIX) 40 MG tablet TAKE 1 TABLET BY MOUTH ONCE DAILY 30 MINTUES BEFORE BREAKFAST. Patient taking differently: Take 40 mg by mouth daily before breakfast.  03/01/19  Yes Carlis Stable, NP  PARoxetine (PAXIL) 40 MG tablet Take 1 tablet (40 mg total) by mouth daily. 11/29/19  Yes [place] Crane, MD  PROAIR HFA 108 [phone] Base) MCG/ACT inhaler Inhale 1-2 puffs into the lungs every 6 (six) hours as needed for wheezing or shortness of breath.  02/25/20  Yes [provider]  QUEtiapine (SEROQUEL) 100 MG tablet Take 1 tablet (100 mg total) by mouth at bedtime. 11/29/19  Yes Nevada Crane, MD  simvastatin (ZOCOR) 40 MG tablet Take 40 mg by mouth at bedtime.    Yes [provider]  divalproex (DEPAKOTE ER) 500 MG 24 hr tablet Take 1 tablet (500 mg total) by mouth every evening. Patient not taking: Reported on 03/17/2020 11/29/19   [place] Crane, MD    Physical Exam: Vitals:   03/17/20 2015 03/17/20 2200 03/17/20 2230 03/17/20 2300  BP:  (!) 162/71 (!) 147/59 138/72  Pulse: (!) 101 91 93   Resp: (!) 22  19   Temp:      TempSrc:      SpO2: 97% 96% 94%   Weight:      Height:        Constitutional: NAD, calm,  comfortable Vitals:   03/17/20 2015 03/17/20 2200 03/17/20 2230 03/17/20 2300  BP:  (!) 162/71 (!) 147/59 138/72  Pulse: (!) 101 91 93   Resp: (!) 22  19   Temp:      TempSrc:      SpO2: 97% 96% 94%   Weight:      Height:        General: Patient is a 69 year old female in mild distress secondary to abdominal pain Eyes: PERRL, lids and conjunctivae normal ENMT: Mucous membranes are dry. Posterior pharynx clear of any exudate or lesions.Normal dentition.  Neck: normal, supple, no masses, no thyromegaly Respiratory: clear to auscultation bilaterally, no wheezing, no crackles. Normal respiratory effort. No accessory muscle use.  Cardiovascular: Regular rate and rhythm, no murmurs / rubs / gallops. No extremity edema. 2+ pedal pulses. No carotid bruits.  Abdomen: Abdomen is soft nondistended but mildly tender in epigastric region.  No masses palpated. No hepatosplenomegaly. Bowel sounds positive.  Musculoskeletal: no clubbing / cyanosis. No joint deformity upper and lower extremities. Good ROM, no contractures. Normal muscle tone.  Skin: no rashes, lesions, ulcers. No induration Neurologic: CN 2-12 grossly intact. Sensation intact, DTR normal. Strength 5/5 in all 4.  Psychiatric: Normal judgment and insight. Alert and oriented x 3. Normal mood.   (Anything < 9 systems with 2 bullets each down codes to level 1) (If patient refuses exam can't bill higher level) (Make sure to document decubitus ulcers present on admission -- if possible -- and whether patient has chronic indwelling catheter at time of admission)  Labs on Admission: I have personally reviewed following labs and imaging studies  CBC: Recent Labs  Lab 03/17/20 1422  WBC 12.2*  HGB 15.9*  HCT 47.6*  MCV 92.4  PLT [id number]   Basic Metabolic Panel: Recent Labs  Lab 03/17/20 1422 03/17/20 1934 03/17/20 2305 03/18/20 0337  NA 132* 135 139 141  K 3.9 3.9 3.7 3.8  CL 90* 97* 102 101  CO2 25 23 26 29   GLUCOSE 354* 350*  155* 101*  BUN 21 21 19 18   CREATININE 1.54* 1.32* 1.22* 1.18*  CALCIUM 9.2 8.7* 8.3* 8.3*   GFR: Estimated Creatinine Clearance: 56.5 mL/min (A) (by C-G formula based on SCr of 1.18 mg/dL (H)). Liver Function Tests: Recent  Labs  Lab 03/17/20 1422  AST 19  ALT 19  ALKPHOS 70  BILITOT 1.0  PROT 7.1  ALBUMIN 4.1   Recent Labs  Lab 03/17/20 1422  LIPASE 29   No results for input(s): AMMONIA in the last 168 hours. Coagulation Profile: No results for input(s): INR, PROTIME in the last 168 hours. Cardiac Enzymes: No results for input(s): CKTOTAL, CKMB, CKMBINDEX, TROPONINI in the last 168 hours. BNP (last 3 results) No results for input(s): PROBNP in the last 8760 hours. HbA1C: No results for input(s): HGBA1C in the last 72 hours. CBG: Recent Labs  Lab 03/17/20 2200 03/17/20 2315 03/18/20 0010 03/18/20 0118 03/18/20 0414  GLUCAP 176* 139* 141* 142* 89   Lipid Profile: No results for input(s): CHOL, HDL, LDLCALC, TRIG, CHOLHDL, LDLDIRECT in the last 72 hours. Thyroid Function Tests: No results for input(s): TSH, T4TOTAL, FREET4, T3FREE, THYROIDAB in the last 72 hours. Anemia Panel: No results for input(s): VITAMINB12, FOLATE, FERRITIN, TIBC, IRON, RETICCTPCT in the last 72 hours. Urine analysis:    Component Value Date/Time   COLORURINE AMBER (A) 03/17/2020 1937   APPEARANCEUR TURBID (A) 03/17/2020 1937   LABSPEC 1.018 03/17/2020 1937   PHURINE 5.0 03/17/2020 1937   GLUCOSEU >=500 (A) 03/17/2020 1937   HGBUR LARGE (A) 03/17/2020 1937   BILIRUBINUR NEGATIVE 03/17/2020 1937   KETONESUR 20 (A) 03/17/2020 1937   PROTEINUR 30 (A) 03/17/2020 1937   UROBILINOGEN 1.0 01/10/2015 1754   NITRITE NEGATIVE 03/17/2020 1937   LEUKOCYTESUR TRACE (A) 03/17/2020 1937    Radiological Exams on Admission: No results found.    Assessment/Plan Principal Problem:   DKA (diabetic ketoacidoses) [facility]) Patient presented with DKA and management started according to the DKA  protocol with IV fluids and insulin drip. Point of care blood glucose monitoring every 1 hour. BMP every 4 hours Change the fluids to D5 half NS and the blood glucose improve to 250. N.p.o. till the anion gap is closed and and is able to tolerate the food. Continue to monitor potassium level and repleted as needed. Will give the patient 10 units of subcu insulin once the anion gap is closed and continue the insulin drip for another hour and will discontinue insulin drip if blood glucose is within normal limits. High-dose sliding scale insulin will be ordered. Hemoglobin A1c ordered Patient counseled about taking her insulin as prescribed.  Active Problems:   Nausea and vomiting Zofran every 6 hours as needed ordered for nausea and vomiting    Diarrhea No more episode of diarrhea in the ED. Continue IV fluids  Acute kidney injury Continue IV fluids. Continue to monitor creatinine level.  Hypercholesterolemia Patient will be started on home medication once she is able to take medications orally  Hypertension Blood pressure is stable Continue home medications    Noncompliance with medication regimen Patient counseled for medications compliance.   Medication reconciliation will be done once the pharmacy confirms the list of home medications.  DVT prophylaxis: Heparin Code Status: Full code Family Communication: No family member present at bedside Disposition Plan:  Consults called: None Admission status: Inpatient/ ICU   Edmonia Lynch MD [facility] Pager [phone]   If 7PM-7AM, please contact night-coverage [email or web address] Password   03/18/2020, 6:09 AM

## 2020-03-18 NOTE — TOC Initial Note (Signed)
Transition of Care [facility]) - Initial/Assessment Note    Patient Details  Name: Claudia Keller MRN: [id number] Date of Birth: 05/10/1951  Transition of Care [facility] CM/SW Contact:    Boneta Lucks, RN Phone Number: 03/18/2020, 3:48 PM  Clinical Narrative:       Patient admitted with DKA, patient lives at home alone. Patient will need home health PT/RN. Patient has [employer]. Vaughan Basta with [facility] accept the referral.             Expected Discharge Plan: [place] Barriers to Discharge: Continued Medical Work up   Patient Goals and CMS Choice Patient states their goals for this hospitalization and ongoing recovery are:: to go home. CMS Medicare.gov Compare Post Acute Care list provided to:: Patient    Expected Discharge Plan and Services Expected Discharge Plan: [place]     Post Acute Care Choice: Home Health    Do you feel safe going back to the place where you live?: Yes      Need for Family Participation in Patient Care: Yes (Comment) Care giver support system in place?: Yes (comment)   Criminal Activity/Legal Involvement Pertinent to Current Situation/Hospitalization: No - Comment as needed  Activities of Daily Living Home Assistive Devices/Equipment: CBG Meter, Dentures (specify type), Eyeglasses, Walker (specify type) ADL Screening (condition at time of admission) Patient's cognitive ability adequate to safely complete daily activities?: Yes Is the patient deaf or have difficulty hearing?: No Does the patient have difficulty seeing, even when wearing glasses/contacts?: No Does the patient have difficulty concentrating, remembering, or making decisions?: No Patient able to express need for assistance with ADLs?: Yes Does the patient have difficulty dressing or bathing?: No Independently performs ADLs?: Yes (appropriate for developmental age) Does the patient have difficulty walking or climbing stairs?: No Weakness of  Legs: Both Weakness of Arms/Hands: None   Emotional Assessment    Alcohol / Substance Use: Not Applicable Psych Involvement: No (comment)  Admission diagnosis:  DKA (diabetic ketoacidoses) ([place]) [E11.10] Nausea vomiting and diarrhea [R11.2, R19.7] Diabetic ketoacidosis without coma associated with type 2 diabetes mellitus ([place]) [E11.10] Patient Active Problem List   Diagnosis Date Noted  . Noncompliance with medication regimen 03/18/2020  . Acute renal failure superimposed on stage 3a chronic kidney disease ([place]) 03/18/2020  . DKA (diabetic ketoacidoses) ([place]) 03/17/2020  . Bipolar II disorder ([place]) 04/17/2018  . PTSD (post-traumatic stress disorder) 04/17/2018  . Personal history of noncompliance with medical treatment, presenting hazards to health 08/17/2016  . Emesis 05/20/2016  . Uncontrolled diabetes mellitus with stage 2 chronic kidney disease ([place]) 09/08/2015  . Essential hypertension, benign 09/08/2015  . Primary hypothyroidism 09/08/2015  . Diverticulosis of colon without hemorrhage   . Nausea and vomiting 10/27/2011  . Chest pain 06/24/2011  . Hypercholesterolemia 06/24/2011  . Tobacco abuse 06/24/2011  . IRRITABLE BOWEL SYNDROME 11/12/2009  . Diarrhea 11/12/2009  . OTHER SYMPTOMS INVOLVING DIGESTIVE SYSTEM OTHER 11/12/2009  . History of colonic polyps 11/12/2009  . CLOSED FRACTURE OF METATARSAL BONE 11/30/2007  . HIGH BLOOD PRESSURE 11/30/2007   PCP:  Rosita Fire, MD Pharmacy:   [facility], [place] [address] [place] [address] Phone: [phone] Fax: [phone]  [facility], [address] [place] [address] Phone: [phone] Fax: [phone]

## 2020-03-18 NOTE — Evaluation (Signed)
Physical Therapy Evaluation Patient Details Name: Claudia Keller MRN: [id number] DOB: 03/08/1951 Today's Date: 03/18/2020   History of Present Illness  Claudia Keller is a 69 y.o. female with medical history significant of hypertension, hyperlipidemia, diabetes mellitus requiring insulin, COPD, hypothyroidism Claudia Keller presented to ED for evaluation of nausea vomiting diarrhea and abdominal pain.  Patient states that she had multiple episodes of nonbloody vomiting this morning and also had 2 episodes of diarrhea without hematochezia or melena.  Patient states that she is feeling very weak and having generalized muscular pain but denies fever, chills, chest pain, shortness of breath and urinary symptoms.  Patient further reported that she has not been compliant with her insulin regimen as prescribed because she forgets and her blood glucose level was 400 yesterday when she checked it at night and most of the time it runs above 200.  Patient also admits of having epigastric abdominal pain that is improved in intensity at this time.    Clinical Impression  Patient functioning near baseline for functional mobility and gait, demonstrates labored cadence with tendency to reach for nearby objects for support, safer using RW, able to transfer to commode, briefly sat up in chair before requesting to go back to bed due to fatigue. Patient on room air during ambulation with SpO2 remaining at 94%. Patient will benefit from continued physical therapy in hospital and recommended venue below to increase strength, balance, endurance for safe ADLs and gait.    Follow Up Recommendations Home health PT;Supervision - Intermittent;Supervision for mobility/OOB    Equipment Recommendations  None recommended by PT    Recommendations for Other Services       Precautions / Restrictions Precautions Precautions: Fall Restrictions Weight Bearing Restrictions: No      Mobility  Bed Mobility Overal bed mobility: Modified  Independent                Transfers Overall transfer level: Needs assistance Equipment used: 1 person hand held assist;Rolling walker (2 wheeled) Transfers: Sit to/from [facility] Sit to Stand: Supervision Stand pivot transfers: Min guard       General transfer comment: increased time, labored movement  Ambulation/Gait Ambulation/Gait assistance: Min guard Gait Distance (Feet): 35 Feet Assistive device: Rolling walker (2 wheeled);1 person hand held assist Gait Pattern/deviations: Decreased step length - right;Decreased step length - left;Decreased stride length Gait velocity: decreased   General Gait Details: slightly labored cadence with tendency to reach for nearby objects for support, safer using RW, limited secondary to c/o fatigue, on room air with SpO2 at 94%  Stairs            Wheelchair Mobility    Modified Rankin (Stroke Patients Only)       Balance Overall balance assessment: Needs assistance Sitting-balance support: Feet supported;No upper extremity supported Sitting balance-Leahy Scale: Good Sitting balance - Comments: seated at EOB   Standing balance support: During functional activity;No upper extremity supported Standing balance-Leahy Scale: Poor Standing balance comment: fair/poor without AD, fair/good using RW                             Pertinent Vitals/Pain Pain Assessment: No/denies pain    Home Living Family/patient expects to be discharged to:: Private residence Living Arrangements: Alone Available Help at Discharge: Family;Personal care attendant Type of Home: Apartment Home Access: Elevator     Home Layout: One level Home Equipment: Cane - single point;Walker - 4 wheels;Bedside commode;Shower seat  Prior Function Level of Independence: Independent with assistive device(s);Needs assistance   Gait / Transfers Assistance Needed: household ambulator using SPC PRN  ADL's / Homemaking  Assistance Needed: home aide 5 hours/day x 5 days/week, family assist with community ADLs        Hand Dominance        Extremity/Trunk Assessment   Upper Extremity Assessment Upper Extremity Assessment: Generalized weakness    Lower Extremity Assessment Lower Extremity Assessment: Generalized weakness    Cervical / Trunk Assessment Cervical / Trunk Assessment: Normal  Communication   Communication: No difficulties  Cognition Arousal/Alertness: Awake/alert Behavior During Therapy: WFL for tasks assessed/performed Overall Cognitive Status: Within Functional Limits for tasks assessed                                        General Comments      Exercises     Assessment/Plan    PT Assessment Patient needs continued PT services  PT Problem List Decreased strength;Decreased activity tolerance;Decreased balance;Decreased mobility       PT Treatment Interventions Balance training;Gait training;Stair training;Functional mobility training;Therapeutic activities;Therapeutic exercise;Patient/family education    PT Goals (Current goals can be found in the Care Plan section)  Acute Rehab PT Goals Patient Stated Goal: return home with assistance PT Goal Formulation: With patient Time For Goal Achievement: 03/25/20 Potential to Achieve Goals: Good    Frequency Min 3X/week   Barriers to discharge        Co-evaluation               AM-PAC PT "6 Clicks" Mobility  Outcome Measure Help needed turning from your back to your side while in a flat bed without using bedrails?: None Help needed moving from lying on your back to sitting on the side of a flat bed without using bedrails?: None Help needed moving to and from a bed to a chair (including a wheelchair)?: A Little Help needed standing up from a chair using your arms (e.g., wheelchair or bedside chair)?: A Little Help needed to walk in hospital room?: A Little Help needed climbing 3-5 steps with a  railing? : A Lot 6 Click Score: 19    End of Session   Activity Tolerance: Patient tolerated treatment well;Patient limited by fatigue Patient left: in bed;with call bell/phone within reach Nurse Communication: Mobility status PT Visit Diagnosis: Unsteadiness on feet (R26.81);Other abnormalities of gait and mobility (R26.89);Muscle weakness (generalized) (M62.81)    Time: 0935-1010 PT Time Calculation (min) (ACUTE ONLY): 35 min   Charges:   PT Evaluation $PT Eval Moderate Complexity: 1 Mod PT Treatments $Therapeutic Activity: 23-37 mins        11:30 AM, 03/18/20 Lonell Grandchild, MPT Physical Therapist with [facility] [phone] office [phone] mobile phone

## 2020-03-18 NOTE — ED Provider Notes (Signed)
.  Critical Care Performed by: Hayden Rasmussen, MD Authorized by: Hayden Rasmussen, MD   Critical care provider statement:    Critical care time (minutes):  45   Critical care time was exclusive of:  Separately billable procedures and treating other patients   Critical care was necessary to treat or prevent imminent or life-threatening deterioration of the following conditions:  Metabolic crisis   Critical care was time spent personally by me on the following activities:  Discussions with consultants, evaluation of patient's response to treatment, examination of patient, ordering and performing treatments and interventions, ordering and review of laboratory studies, ordering and review of radiographic studies, pulse oximetry, re-evaluation of patient's condition, obtaining history from patient or surrogate, review of old charts and development of treatment plan with patient or surrogate   I assumed direction of critical care for this patient from another provider in my specialty: no       Hayden Rasmussen, MD 03/18/20 1120

## 2020-03-18 NOTE — Plan of Care (Signed)
  Problem: Acute Rehab PT Goals(only PT should resolve) Goal: Pt Will Go Supine/Side To Sit Outcome: Progressing Flowsheets (Taken 03/18/2020 1131) Pt will go Supine/Side to Sit:  with modified independence  Independently Goal: Patient Will Transfer Sit To/From Stand Outcome: Progressing Flowsheets (Taken 03/18/2020 1131) Patient will transfer sit to/from stand:  with modified independence  with supervision Goal: Pt Will Transfer Bed To Chair/Chair To Bed Outcome: Progressing Flowsheets (Taken 03/18/2020 1131) Pt will Transfer Bed to Chair/Chair to Bed:  with modified independence  with supervision Goal: Pt Will Ambulate Outcome: Progressing Flowsheets (Taken 03/18/2020 1131) Pt will Ambulate:  75 feet  with min guard assist  with supervision  with cane  with rolling walker   11:32 AM, 03/18/20 Lonell Grandchild, MPT Physical Therapist with [facility] [phone] office [phone] mobile phone

## 2020-03-18 NOTE — Progress Notes (Signed)
Inpatient Diabetes Program Recommendations  AACE/ADA: New Consensus Statement on Inpatient Glycemic Control (2015)  Target Ranges:  Prepandial:   less than 140 mg/dL      Peak postprandial:   less than 180 mg/dL (1-2 hours)      Critically ill patients:  140 - 180 mg/dL   Lab Results  Component Value Date   GLUCAP 178 (H) 03/18/2020   HGBA1C 12.3 (H) 03/17/2020    Review of Glycemic Control Results for Claudia Keller, Claudia Keller (MRN [id number]) as of 03/18/2020 11:28  Ref. Range 03/17/2020 23:15 03/18/2020 00:10 03/18/2020 01:18 03/18/2020 04:14 03/18/2020 06:24 03/18/2020 08:13 03/18/2020 11:13  Glucose-Capillary Latest Ref Range: 70 - 99 mg/dL 139 (H) 141 (H) 142 (H) 89 120 (H) 137 (H) 178 (H)   Diabetes history: DM 2 Outpatient Diabetes medications:  Humalog 15-24 units tid with meals Lantus 80 units q HS Tradjenta 5 mg daily Metformin 500 mg bid Current orders for Inpatient glycemic control:  Novolog resistant tid with meals and HS Lantus 20 units daily  Inpatient Diabetes Program Recommendations:    May consider reducing Novolog correction to moderate tid with meals.   Spoke with patient by phone.  She admits that she is not taking her insulin consistently (current regimen requires 4 shots/day) and she often misses.  She says she can "get an alarm clock" to help.   Currently she has an aide that comes in 5 days a week for 5 hours a day to help with grocery shopping, ADL's, house work, etc.  She states her children are grown and have their own families and provide little support.    May do better with a simpler regimen that she could at least take consistently?  Consider just giving patient Lantus 40 units once a day initially and letting PCP adjust.  She also needs home health RN to assist her with Home medications, including organization, etc?    It will be interesting to see what blood sugars do during hospitalization as I am concerned if she takes medications as ordered, she may have  hypoglycemia. Discussed this with patient and she states she has had lows in the past and treats with a snack.  Reviewed signs/symptoms of high and low blood sugars  Also discussed treatment of low blood sugars.  Patient was able to teach back.    Will follow.   Thanks  Adah Perl, RN, BC-ADM Inpatient Diabetes Coordinator Pager [phone] (8a-5p)

## 2020-03-19 DIAGNOSIS — N1831 Chronic kidney disease, stage 3a: Secondary | ICD-10-CM

## 2020-03-19 DIAGNOSIS — R197 Diarrhea, unspecified: Secondary | ICD-10-CM

## 2020-03-19 DIAGNOSIS — R112 Nausea with vomiting, unspecified: Secondary | ICD-10-CM

## 2020-03-19 DIAGNOSIS — G4700 Insomnia, unspecified: Secondary | ICD-10-CM

## 2020-03-19 DIAGNOSIS — N179 Acute kidney failure, unspecified: Secondary | ICD-10-CM

## 2020-03-19 DIAGNOSIS — K559 Vascular disorder of intestine, unspecified: Secondary | ICD-10-CM

## 2020-03-19 LAB — GLUCOSE, CAPILLARY
Glucose-Capillary: 129 mg/dL — ABNORMAL HIGH (ref 70–99)
Glucose-Capillary: 137 mg/dL — ABNORMAL HIGH (ref 70–99)
Glucose-Capillary: 139 mg/dL — ABNORMAL HIGH (ref 70–99)
Glucose-Capillary: 136 mg/dL — ABNORMAL HIGH (ref 70–99)

## 2020-03-19 LAB — CBC
HCT: 39.9 % (ref 36.0–46.0)
Hemoglobin: 13.2 g/dL (ref 12.0–15.0)
MCH: 31.4 pg (ref 26.0–34.0)
MCHC: 33.1 g/dL (ref 30.0–36.0)
MCV: 95 fL (ref 80.0–100.0)
Platelets: 142 10*3/uL — ABNORMAL LOW (ref 150–400)
RBC: 4.2 MIL/uL (ref 3.87–5.11)
RDW: 14 % (ref 11.5–15.5)
WBC: 14.7 10*3/uL — ABNORMAL HIGH (ref 4.0–10.5)
nRBC: 0 % (ref 0.0–0.2)

## 2020-03-19 LAB — BASIC METABOLIC PANEL
Anion gap: 9 (ref 5–15)
BUN: 9 mg/dL (ref 8–23)
CO2: 22 mmol/L (ref 22–32)
Calcium: 7.8 mg/dL — ABNORMAL LOW (ref 8.9–10.3)
Chloride: 105 mmol/L (ref 98–111)
Creatinine, Ser: 0.82 mg/dL (ref 0.44–1.00)
GFR calc Af Amer: >60 mL/min (ref >60)
GFR calc non Af Amer: >60 mL/min (ref >60)
Glucose, Bld: 127 mg/dL — ABNORMAL HIGH (ref 70–99)
Potassium: 3.9 mmol/L (ref 3.5–5.1)
Sodium: 136 mmol/L (ref 135–145)

## 2020-03-19 LAB — URINE CULTURE

## 2020-03-19 LAB — HIV ANTIBODY (ROUTINE TESTING W REFLEX): HIV Screen 4th Generation wRfx: NONREACTIVE

## 2020-03-19 LAB — MAGNESIUM: Magnesium: 2.5 mg/dL — ABNORMAL HIGH (ref 1.7–2.4)

## 2020-03-19 MED ORDER — PIPERACILLIN-TAZOBACTAM 3.375 G IVPB
3.3750 g | Freq: Three times a day (TID) | INTRAVENOUS | Status: DC
  Administered 2020-03-20 (×2): 3.375 g via INTRAVENOUS

## 2020-03-19 MED ORDER — PIPERACILLIN-TAZOBACTAM 3.375 G IVPB 30 MIN
3.3750 g | Freq: Once | INTRAVENOUS | Status: AC
  Administered 2020-03-19: 3.375 g via INTRAVENOUS
  Filled 2020-03-19 (×2): qty 50

## 2020-03-19 MED ORDER — TRAZODONE HCL 50 MG PO TABS
50.0000 mg | ORAL_TABLET | Freq: Every evening | ORAL | Status: DC | PRN
  Administered 2020-03-19: 50 mg via ORAL
  Filled 2020-03-19: qty 1

## 2020-03-19 NOTE — Care Management Important Message (Signed)
Important Message  Patient Details  Name: Claudia Keller MRN: [id number] Date of Birth: [birth year]   Medicare Important Message Given:  Yes     Tommy Medal 03/19/2020, 4:23 PM

## 2020-03-19 NOTE — Progress Notes (Signed)
Patient request something to help her sleep, Dr. Olevia Bowens notified. Awaiting new orders.

## 2020-03-19 NOTE — Consult Note (Signed)
Referring Provider: Orson Eva, DO Primary Care Physician:  Rosita Fire, MD Primary Gastroenterologist:  Dr. Gala Romney  Reason for Consultation:    Nausea vomiting and diarrhea. Abnormal CT revealing wall thickening to splenic flexure and descending colon.  HPI:   Patient is 69 year old Caucasian female with longstanding diabetes mellitus, hyperlipidemia irritable bowel syndrome history of colonic diverticulosis hypothyroidism history of colonic adenomas and COPD who is still smoking 2 packs/day presented to emergency room 2 days ago with acute onset of nausea vomiting abdominal pain and diarrhea.  Evaluation in emergency room revealed her to be in diabetic ketoacidosis and abdominal pelvic CT revealed colonic wall thickening to splenic flexure and descending colon consistent with segmental colitis.  Patient was admitted to hospitalist service and begun on ceftriaxone and metronidazole.  She was also treated for DKA and now back to basal and meal coverage insulin.  Patient says she was feeling fine when she went to bed on Sunday night or 03/16/2020.  She woke up sick with nausea vomiting diffuse abdominal pain and nonbloody diarrhea.  She was not able to keep food or fluid down.  Therefore she did not take her insulin dose.  Her symptoms continued leading to ER visit. Patient says abdominal pain/cramping has decreased in intensity but not gone away completely.  She complains of nausea.  She states she vomited few times this morning.  She says the last time she threw up was 11 AM.  She has had 3 loose stools today.  She denies melena or rectal bleeding or hematemesis.  She also denies fever or chills.  No history of recent antibiotic use.  She drinks bottled water.  She says she has a history of IBS and her bowels are regular.  She has anywhere from diarrhea to normal stools to constipation.  She also has history of colonic adenomas and her last colonoscopy by Dr. Gala Romney was in April 2016.  She is due for  exam this year.  She also had random biopsies from descending and sigmoid colon and these were negative for microscopic or lymphocytic colitis. Patient denies recent weight loss.  Patient is widowed.  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  She lives alone.  She states she is active.  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  She smokes 2 packs of cigarettes per day.  She wants to try Chantix when she leaves the hospital.  She does not drink alcohol.  She has 2 grownup children.  Her son lives few miles away.  Her daughter lives in [place].  Father had coronary artery disease and died following an MI at age 68.  Mother was diagnosed with metastatic carcinoma and died within few months of diagnosis when she was in her 106s.  She had 1 brother who apparently suffered stroke while he was driving a bike and died.  He was in his 21s.  She has 5 younger sisters and they are all in good health.   Past Medical History:  Diagnosis Date  . Arthritis   . COPD (chronic obstructive pulmonary disease) ([place])   . Diverticulosis   . Essential hypertension, benign   . Hx of colonic polyps   . Hypothyroidism   . IBS (irritable bowel syndrome)   . Irritable bowel syndrome       . Mixed hyperlipidemia   . PTSD (post-traumatic stress disorder)   . S/P colonoscopy Jan 2011   RMR: pancolonic diverticula, polypoid rectal  mucosa with  prominent lymphoid aggregates, repeat in Jan 2016   . Shortness of breath dyspnea   . Stress incontinence, female   . Thyroid disease   . Tobacco use   . Type 2 diabetes mellitus ([place])     Past Surgical History:  Procedure Laterality Date  . Bilateral tubal ligation    . BREAST BIOPSY Left 10/16/2014   negative  . CHOLECYSTECTOMY  09/11/2012   Procedure: LAPAROSCOPIC CHOLECYSTECTOMY;  Surgeon: Jamesetta So, MD;  Location: [facility];  Service: General;  Laterality: N/A;  . COLONOSCOPY  11/17/2009   Dr. Gala Romney: normal  rectum, pancolonic diverticula, polyps benign, no adenomas.   . COLONOSCOPY N/A 02/12/2015   Dr. Gala Romney: colonic diverticulosis, multiple colonic polyps (tubular adenomas), negative segmental biopsies. Surveillance in 2021  . ESOPHAGOGASTRODUODENOSCOPY  11/23/2011   Dr. Gala Romney: Erosive reflux esophagitis; small hiatal hernia; esophagus dilated empirically  . MALONEY DILATION  11/23/2011   Procedure: Venia Minks DILATION;  Surgeon: Daneil Dolin, MD;  Location: [facility] SUITE;  Service: Endoscopy;  Laterality: N/A;  . SKIN LESION EXCISION     on buttocks  . TUBAL LIGATION      Prior to Admission medications   Medication Sig Start Date End Date Taking? Authorizing Provider  amLODipine (NORVASC) 10 MG tablet Take 10 mg by mouth daily.    Yes [provider]  Calcium 600-200 MG-UNIT tablet Take 1 tablet by mouth daily.   Yes [provider]  colestipol (COLESTID) 1 g tablet TAKE 2 TABLETS DAILY FOR DIARRHEA. DO NOT TAKE WITHIN 2 HOURS OF OTHER ORAL MEDICATIONS. HOLD FOR CONSTIPATION. Patient taking differently: Take 2 g by mouth daily. *DO NOT TAKE WITHIN 2 HOURS OF OTHER ORAL MEDICATIONS-HOLD FOR CONSTIPATION 06/18/19  Yes Carlis Stable, NP  divalproex (DEPAKOTE) 250 MG DR tablet Take 1 tablet (250 mg total) by mouth daily. 11/29/19 03/08/22 Yes [place] Crane, MD  furosemide (LASIX) 20 MG tablet Take 20 mg by mouth daily.    Yes [provider]  gabapentin (NEURONTIN) 100 MG capsule Take 100 mg by mouth 3 (three) times daily.    Yes [provider]  insulin glargine (LANTUS) 100 UNIT/ML injection Inject 80 Units into the skin at bedtime.    Yes [provider]  insulin lispro (HUMALOG) 100 UNIT/ML injection Inject 15-24 Units into the skin 3 (three) times daily with meals. Per sliding scale. 151-200=18 units; 201-250= 9 units; 251-300=20 units; 301-350=21 units; 351-400= 22 units; 401-450=23 units; 500-551=24 units.   Yes [provider]  levothyroxine  (SYNTHROID) 200 MCG tablet Take 200 mcg by mouth daily before breakfast.    Yes [provider]  linagliptin (TRADJENTA) 5 MG TABS tablet Take 5 mg by mouth daily.    Yes [provider]  lisinopril (PRINIVIL,ZESTRIL) 20 MG tablet Take 20 mg by mouth daily.   Yes [provider]  loperamide (IMODIUM) 2 MG capsule Take by mouth as needed for diarrhea or loose stools.   Yes [provider]  metFORMIN (GLUCOPHAGE) 500 MG tablet Take 500 mg by mouth 2 (two) times daily.    Yes [provider]  mirtazapine (REMERON) 7.5 MG tablet Take 1 tablet (7.5 mg total) by mouth at bedtime. 11/29/19  Yes Nevada Crane, MD  naproxen sodium (ALEVE) 220 MG tablet Take 220-440 mg by mouth daily as needed (for pain).   Yes [provider]  ondansetron (ZOFRAN) 4 MG tablet Take 1 tablet (4 mg total) by mouth every 8 (eight)  hours as needed for nausea or vomiting. 10/17/18  Yes Walden Field A, NP  pantoprazole (PROTONIX) 40 MG tablet TAKE 1 TABLET BY MOUTH ONCE DAILY 30 MINTUES BEFORE BREAKFAST. Patient taking differently: Take 40 mg by mouth daily before breakfast.  03/01/19  Yes Carlis Stable, NP  PARoxetine (PAXIL) 40 MG tablet Take 1 tablet (40 mg total) by mouth daily. 11/29/19  Yes Nevada Crane, MD  PROAIR HFA 108 [phone] Base) MCG/ACT inhaler Inhale 1-2 puffs into the lungs every 6 (six) hours as needed for wheezing or shortness of breath.  02/25/20  Yes [provider]  QUEtiapine (SEROQUEL) 100 MG tablet Take 1 tablet (100 mg total) by mouth at bedtime. 11/29/19  Yes Nevada Crane, MD  simvastatin (ZOCOR) 40 MG tablet Take 40 mg by mouth at bedtime.    Yes [provider]  divalproex (DEPAKOTE ER) 500 MG 24 hr tablet Take 1 tablet (500 mg total) by mouth every evening. Patient not taking: Reported on 03/17/2020 11/29/19   [place] Crane, MD    Current Facility-Administered Medications  Medication Dose Route Frequency Provider Last Rate Last Admin  . 0.9  % NaCl with KCl 20 mEq/ L  infusion   Intravenous Continuous Tat, Shanon Brow, MD 100 mL/hr at 03/19/20 0616 New Bag at 03/19/20 [id number]  . amLODipine (NORVASC) tablet 5 mg  5 mg Oral Daily Tat, David, MD   5 mg at 03/19/20 1023  . atorvastatin (LIPITOR) tablet 20 mg  20 mg Oral Daily Tat, David, MD   20 mg at 03/19/20 1024  . cefTRIAXone (ROCEPHIN) 2 g in sodium chloride 0.9 % 100 mL IVPB  2 g Intravenous Q24H Orson Eva, MD   Stopped at 03/18/20 1826  . dextrose 50 % solution 0-50 mL  0-50 mL Intravenous PRN Tat, David, MD      . divalproex (DEPAKOTE) DR tablet 500 mg  500 mg Oral Benay Pike, MD   500 mg at 03/18/20 2200  . heparin injection 5,000 Units  5,000 Units Subcutaneous Franco Collet, MD   5,000 Units at 03/19/20 1300  . insulin aspart (novoLOG) injection 0-20 Units  0-20 Units Subcutaneous TID WC Orson Eva, MD   3 Units at 03/19/20 1255  . insulin aspart (novoLOG) injection 0-5 Units  0-5 Units Subcutaneous QHS Tat, David, MD      . insulin glargine (LANTUS) injection 20 Units  20 Units Subcutaneous Daily Tat, David, MD   20 Units at 03/19/20 1024  . levothyroxine (SYNTHROID) tablet 200 mcg  200 mcg Oral [id number] Orson Eva, MD   200 mcg at 03/19/20 0557  . metroNIDAZOLE (FLAGYL) IVPB 500 mg  500 mg Intravenous Franco Collet, MD   Stopped at 03/19/20 1130  . morphine 2 MG/ML injection 1 mg  1 mg Intravenous Q4H PRN Tat, David, MD   1 mg at 03/18/20 2137  . nicotine (NICODERM CQ - dosed in mg/24 hours) patch 21 mg  21 mg Transdermal Daily Tat, David, MD      . ondansetron [facility]) injection 4 mg  4 mg Intravenous Q6H PRN Orson Eva, MD   4 mg at 03/18/20 1939  . PARoxetine (PAXIL) tablet 40 mg  40 mg Oral Daily Tat, David, MD   40 mg at 03/19/20 1024    Allergies as of 03/17/2020  . (No Known Allergies)    Family History  Problem Relation Age of Onset  . Schizophrenia Brother   . Stroke Brother   . Cancer Mother  unsure what type  . Heart disease Father   . Heart attack  Father   . Coronary artery disease Other   . Diabetes type II Other   . Cancer Son        bladder  . Colon cancer Neg Hx     Social History   Socioeconomic History  . Marital status: Legally Separated    Spouse name: Not on file  . Number of children: Not on file  . Years of education: Not on file  . Highest education level: Not on file  Occupational History  . Not on file  Tobacco Use  . Smoking status: Current Every Day Smoker    Packs/day: 2.00    Years: 43.00    Pack years: 86.00    Types: Cigarettes  . Smokeless tobacco: Never Used  Substance and Sexual Activity  . Alcohol use: No    Alcohol/week: 0.0 standard drinks  . Drug use: No  . Sexual activity: Not Currently    Birth control/protection: Surgical, Post-menopausal    Comment: tubal  Other Topics Concern  . Not on file  Social History Narrative  . Not on file   Social Determinants of Health   Financial Resource Strain:   . Difficulty of Paying Living Expenses:   Food Insecurity:   . Worried About Charity fundraiser in the Last Year:   . Arboriculturist in the Last Year:   Transportation Needs:   . Film/video editor (Medical):   Marland Kitchen Lack of Transportation (Non-Medical):   Physical Activity:   . Days of Exercise per Week:   . Minutes of Exercise per Session:   Stress:   . Feeling of Stress :   Social Connections:   . Frequency of Communication with Friends and Family:   . Frequency of Social Gatherings with Friends and Family:   . Attends Religious Services:   . Active Member of Clubs or Organizations:   . Attends Archivist Meetings:   Marland Kitchen Marital Status:   Intimate Partner Violence:   . Fear of Current or Ex-Partner:   . Emotionally Abused:   Marland Kitchen Physically Abused:   . Sexually Abused:     Review of Systems: See HPI, otherwise normal ROS  Physical Exam: Temp:  [98.1 F (36.7 C)-98.8 F (37.1 C)] 98.2 F (36.8 C) (05/12 1412) Pulse Rate:  [77-98] 81 (05/12 1412) Resp:   [15-22] 16 (05/12 1412) BP: (127-157)/(51-97) 140/63 (05/12 1412) SpO2:  [91 %-96 %] 92 % (05/12 1412) Weight:  [104.1 kg] 104.1 kg (05/12 0500) Last BM Date: 03/17/20  Patient is alert and in no acute distress. Conjunctival was pink.  Sclerae nonicteric. Oropharyngeal mucosa is normal.  She is edentulous. She says she does not wear dentures as they do not fit. Neck without masses thyromegaly or lymphadenopathy. Cardiac exam with regular rhythm normal S1 and S2.  No murmur gallop noted. Auscultation lungs reveal vesicular breath sounds bilaterally.  No rales or rhonchi noted. Abdomen is full.  Bowel sounds are normal.  On palpation abdomen is soft with mild generalized tenderness.  No guarding rebound organomegaly or masses. She does not have peripheral edema or clubbing.   Lab Results: Recent Labs    03/17/20 1422 03/18/20 0731 03/19/20 0403  WBC 12.2* 13.4* 14.7*  HGB 15.9* 14.3 13.2  HCT 47.6* 42.6 39.9  PLT 194 162 142*   BMET Recent Labs    03/18/20 0337 03/18/20 0731 03/19/20 0403  NA 141 138  136  K 3.8 3.6 3.9  CL 101 99 105  CO2 29 28 22   GLUCOSE 101* 139* 127*  BUN 18 17 9   CREATININE 1.18* 1.11* 0.82  CALCIUM 8.3* 8.3* 7.8*   LFT Recent Labs    03/17/20 1422  PROT 7.1  ALBUMIN 4.1  AST 19  ALT 19  ALKPHOS 70  BILITOT 1.0    CT ABDOMEN PELVIS WO CONTRAST  Result Date: 03/18/2020 CLINICAL DATA:  Emesis, diarrhea.  COPD EXAM: CT ABDOMEN AND PELVIS WITHOUT CONTRAST TECHNIQUE: Multidetector CT imaging of the abdomen and pelvis was performed following the standard protocol without IV contrast. COMPARISON:  None. FINDINGS: Lower chest: Linear thickening at the LEFT lung base is chronic. Hepatobiliary: No focal hepatic lesion. Postcholecystectomy. No biliary dilatation. Pancreas: Pancreas is normal. No ductal dilatation. No pancreatic inflammation. Spleen: Normal spleen Adrenals/urinary tract: Adrenal glands and kidneys are normal. The ureters and bladder  normal. Stomach/Bowel: Stomach, small bowel, appendix, and cecum are normal. Ascending and transverse colon normal. Beginning at the splenic flexure and extending to the proximal sigmoid colon there is submucosal bowel wall edema and thickening (image 101/6/sagittal). Small amount pericolonic fluid extends along the LEFT pericolic gutter (image [id number]). The greatest submucosal edema is seen at the splenic flexure (image 36/2). There is mild stranding in the pericolonic mesentery and fat. There are no diverticula through this region to suggest acute diverticulitis. No pneumatosis or portal venous gas. No perforation Sigmoid colon contains diverticula without acute infection or inflammation. Rectum normal. Vascular/Lymphatic: Dense calcification of the abdominal aorta which is nonaneurysmal. There is calcification of the ostia of the SMA and celiac trunk. IMA is normal noncontrast Reproductive: Uterus and adnexa unremarkable. Other: No free fluid. Musculoskeletal: No aggressive osseous lesion. IMPRESSION: 1. Segment of submucosal edema and pericolonic stranding involving the splenic flexure and descending colon. Findings consistent with segmental colitis. Differential would include ischemic colitis (particularly at this vascular watershed location), inflammatory bowel disease, and drug induced colitis. 2. No pneumatosis, portal venous gas or intraperitoneal free air. 3. Aortic Atherosclerosis (ICD10-I70.0). Electronically Signed   By: Suzy Bouchard M.D.   On: 03/18/2020 16:04   I have reviewed the CT images.  Segmental colonic wall thickening involves splenic flexure and descending colon.  Assessment;  Patient is 69 year old Caucasian female who presents with acute onset of nausea vomiting abdominal pain and nonbloody diarrhea.  Patient found to be in diabetic ketoacidosis.  Past history significant for longstanding diabetes mellitus and patient smokes 2 cigarettes/day.  Abdominal pelvic CT on admission  revealed segmental wall thickening involving splenic flexure and descending colon with pericolonic stranding.  Patient's diabetic ketoacidosis has been treated and now she is back on basal insulin along along with meal coverage. Patient remains with nausea vomiting and nonbloody diarrhea.  No prior history of antibiotic use and patient drinks bottled water. Suspect acute illness secondary to ischemic colitis for which she has multiple risk factors for infection or food borne illness remains in differential diagnosis. Patient is on IV ceftriaxone and metronidazole. Patient is on heparin subcu every 8 hours.  Patient has history of colonic adenomas.  Last colonoscopy was in April 2016.  She can undergo elective colonoscopy later this year.  Recommendations;  Change diet to full liquids as patient remains with nausea vomiting and diarrhea. Stool studies for C. difficile and GI pathogen panel. If stool studies are negative would proceed with flexible sigmoidoscopy.  Dr. Gala Romney, patient's primary gastroenterologist will be seeing the patient starting tomorrow morning.  LOS: 2 days   Shekela Goodridge  03/19/2020, 4:21 PM

## 2020-03-19 NOTE — Plan of Care (Signed)
  RD consulted for nutrition education regarding diabetes.   Lab Results  Component Value Date   HGBA1C 12.3 (H) 03/17/2020  Met with patient at bedside this morning. She reports having eggs, bacon, and a biscuit this morning for breakfast. She states that she typically eats softer foods, patient is edentulous, she has dentures at home but they do not fit. She recalls usually eating 2 meals/day, recalls [place] or ham sandwiches on white bread with lettuce and tomato for lunch and [employer] frozen meals for dinner, states that she really likes the stuffed peppers, and drinks diet Ginger Ale.    RD provided "Heart Healthy Consistent Carbohydrate" handout from the Academy of Nutrition and Dietetics. Discussed different food groups and their effects on blood sugar, emphasizing carbohydrate-containing foods. Provided list of carbohydrates and recommended serving sizes of common foods.   Discussed importance of controlled and consistent carbohydrate intake throughout the day. Provided examples of ways to balance meals/snacks and encouraged intake of high-fiber, whole grain complex carbohydrates. Provided examples on ways to decrease sodium and fat intake in diet. Discouraged intake of processed foods and use of salt shaker. Teach back method used.  Expect fair compliance.  Body mass index is 37.04 kg/m. Pt meets criteria for obese based on current BMI.  Current diet order is CM, patient is consuming approximately 75% of meals at this time. Labs and medications reviewed. No further nutrition interventions warranted at this time. RD contact information provided. If additional nutrition issues arise, please re-consult RD.  Lajuan Lines, RD, LDN Clinical Nutrition After Hours/Weekend Pager # in [place]

## 2020-03-19 NOTE — Progress Notes (Signed)
Inpatient Diabetes Program Recommendations  AACE/ADA: New Consensus Statement on Inpatient Glycemic Control (2015)  Target Ranges:  Prepandial:   less than 140 mg/dL      Peak postprandial:   less than 180 mg/dL (1-2 hours)      Critically ill patients:  140 - 180 mg/dL   Lab Results  Component Value Date   GLUCAP 129 (H) 03/19/2020   HGBA1C 12.3 (H) 03/17/2020    Review of Glycemic Control Results for Claudia Keller, Claudia Keller (MRN [id number]) as of 03/19/2020 09:23  Ref. Range 03/18/2020 11:13 03/18/2020 16:18 03/18/2020 21:38 03/19/2020 07:34  Glucose-Capillary Latest Ref Range: 70 - 99 mg/dL 178 (H) 96 147 (H) 129 (H)  Diabetes history: DM 2 Outpatient Diabetes medications:  Humalog 15-24 units tid with meals Lantus 80 units q HS Tradjenta 5 mg daily Metformin 500 mg bid Current orders for Inpatient glycemic control:  Novolog resistant tid with meals and HS Lantus 20 units daily  Inpatient Diabetes Program Recommendations:   Blood sugars look really good.  Consider d/c home with only Lantus/Tradjenta to simplify home regimen? Hopefully Home health can assist her with Lantus and getting a routine together to assist with this.  Possibly have Aide remind her to take medications when they are there?   Thanks,  Adah Perl, RN, BC-ADM Inpatient Diabetes Coordinator Pager [phone]  (8a-5p)

## 2020-03-19 NOTE — Progress Notes (Signed)
[facility] night shift.  The patient requested something to help her sleep. Her chart was reviewed. Trazodone 50 mg p.o. at bedtime as needed for insomnia was ordered.  Tennis Must, MD

## 2020-03-19 NOTE — Progress Notes (Addendum)
PROGRESS NOTE  Claudia Keller [id number] DOB: October 11, 1951 DOA: 03/17/2020 PCP: Rosita Fire, MD  Brief History:  69 year old female with a history of bipolar disorder, diabetes mellitus type 2, hypertension, hypothyroidism, hyperlipidemia, tobacco abuse, COPD presenting with nausea and vomiting that began on the morning of 03/17/2020.  Patient denies any fevers, chills, headache, dysuria, hematuria, hematochezia, melena.  She has not had any travels.  She denies any sick contacts.  She has not eaten any raw or undercooked foods.  She states that she has been poorly compliant with her Lantus as well as her lispro.  She states that she misses at least 3 days of Lantus on a weekly basis and only takes the lispro once a day.  She states that she often forgets her dosing and falls asleep.  She cannot recall the last time she took her Lantus.  She complains of periumbilical abdominal pain that began around the same time of her vomiting.  There is no hematochezia, melena.  She denies any new medications.  She appears to have poor insight regarding her medical issues.  The patient lives by herself and uses a cane for assistance to ambulate.  She has a home health aide that comes Monday through Friday for 5 hours. In the emergency department, the patient was afebrile hemodynamically stable with oxygen saturation 96% room air.  BMP showed a sodium 132, serum creatinine 1.54 with anion gap 17.  Initial serum glucose was 354.  WBC 12.2, hemoglobin 15.9, platelets 194,000.  UA showed ketonuria.  Beta hydroxy acid was 2.55.  The patient was started on fluid resuscitation and IV insulin.  She was transitioned to Lantus.  Assessment/Plan: DKA type II -patient started on IV insulin with q 1 hour CBG check and q 4 hour BMPs -pt started on aggressive fluid resuscitation -Electrolytes were monitored and repleted -transitioned to [place] insulin once anion gap closed -diet was advanced once anion gap  closed -HbA1C--12.3  Pyuria -CT abd/pelvis with abd pain and leukocytosis -urine culture--multiple organisms  Colitis -likely ischemic colitis -5/11 CT abd--Segment of submucosal edema and pericolonic stranding involving the splenic flexure and descending colon. Findings consistent with segmental colitis. -GI consult appreciated-->sigmoidoscopy if stool studies neg -broaden abx coverage as WBC up -cdiff--pending -stool pathogen panel--pending  Intractable nausea/ vomiting -had 2 episodes last evening -back down to full liquids -continue zofran  Poor compliance -The patient has poor insight regarding her medical issues -She will benefit from home health RN upon discharge  Acute on chronic renal failure--CKD stage IIIa -Continue IV fluids-->improving -Secondary to volume depletion  Essential hypertension -Continue amlodipine -Holding lisinopril and furosemide secondary to acute on chronic renal failure  Uncontrolled diabetes mellitus type 2 with hyperglycemia -Holding metformin -Hemoglobin A1c--12.3 -Started reduced dose Lantus -Holding Tradjenta  Anxiety/depression -Restart Paxil, Depakote, Remeron  Hypothyroidism -Continue Synthroid  Tobacco abuse -Tobacco cessation discussed -NicoDerm patch  Hyperlipidemia -Continue statin       Disposition Plan: Patient From: Home D/C Place: Home - 1-2  Days Barriers: Not Clinically Stable--not tolerating diet, still vomiting;  abdominal pain persistent with colitis on IV abx; WBC climbing  Family Communication:  No Family at bedside  Consultants:  none  Code Status:  FULL  DVT Prophylaxis:  [place] Heparin    Procedures: As Listed in Progress Note Above  Antibiotics: Ceftriaxone 5/11>>> Metronidazole 5/11>>>   Total time spent 35 minutes.  Greater than 50% spent face to face counseling and coordinating care.  Subjective: Pt complains of abd pain, not much better.  Denies f/c,  cp, sob,  Had 2 episodes n/v last evening.  No diarrhea.  Objective: Vitals:   03/19/20 0500 03/19/20 0613 03/19/20 1000 03/19/20 1412  BP:  137/61 132/63 140/63  Pulse:  80 80 81  Resp:  20 18 16   Temp:  98.5 F (36.9 C)  98.2 F (36.8 C)  TempSrc:  Oral  Oral  SpO2:  93% 94% 92%  Weight: 104.1 kg     Height:        Intake/Output Summary (Last 24 hours) at 03/19/2020 1725 Last data filed at 03/19/2020 1600 Gross per 24 hour  Intake 2460.54 ml  Output 200 ml  Net 2260.54 ml   Weight change: -0.227 kg Exam:   General:  Pt is alert, follows commands appropriately, not in acute distress  HEENT: No icterus, No thrush, No neck mass, [place]/AT  Cardiovascular: RRR, S1/S2, no rubs, no gallops  Respiratory: bibasilar rales  Abdomen: Soft/+BS, LLQ tender, non distended, no guarding  Extremities: No edema, No lymphangitis, No petechiae, No rashes, no synovitis   Data Reviewed: I have personally reviewed following labs and imaging studies Basic Metabolic Panel: Recent Labs  Lab 03/17/20 1934 03/17/20 2305 03/18/20 0337 03/18/20 0731 03/19/20 0403  NA 135 139 141 138 136  K 3.9 3.7 3.8 3.6 3.9  CL 97* 102 101 99 105  CO2 23 26 29 28 22   GLUCOSE 350* 155* 101* 139* 127*  BUN 21 19 18 17 9   CREATININE 1.32* 1.22* 1.18* 1.11* 0.82  CALCIUM 8.7* 8.3* 8.3* 8.3* 7.8*  MG  --   --   --  1.1* 2.5*   Liver Function Tests: Recent Labs  Lab 03/17/20 1422  AST 19  ALT 19  ALKPHOS 70  BILITOT 1.0  PROT 7.1  ALBUMIN 4.1   Recent Labs  Lab 03/17/20 1422  LIPASE 29   No results for input(s): AMMONIA in the last 168 hours. Coagulation Profile: No results for input(s): INR, PROTIME in the last 168 hours. CBC: Recent Labs  Lab 03/17/20 1422 03/18/20 0731 03/19/20 0403  WBC 12.2* 13.4* 14.7*  HGB 15.9* 14.3 13.2  HCT 47.6* 42.6 39.9  MCV 92.4 93.6 95.0  PLT 194 162 142*   Cardiac Enzymes: No results for input(s): CKTOTAL, CKMB, CKMBINDEX, TROPONINI in the last  168 hours. BNP: Invalid input(s): POCBNP CBG: Recent Labs  Lab 03/18/20 1618 03/18/20 2138 03/19/20 0734 03/19/20 1132 03/19/20 1623  GLUCAP 96 147* 129* 137* 139*   HbA1C: Recent Labs    03/17/20 1422  HGBA1C 12.3*   Urine analysis:    Component Value Date/Time   COLORURINE YELLOW 03/18/2020 [place] 03/18/2020 1217   LABSPEC 1.010 03/18/2020 1217   PHURINE 7.0 03/18/2020 1217   GLUCOSEU NEGATIVE 03/18/2020 1217   HGBUR SMALL (A) 03/18/2020 1217   BILIRUBINUR NEGATIVE 03/18/2020 1217   KETONESUR NEGATIVE 03/18/2020 1217   PROTEINUR NEGATIVE 03/18/2020 1217   UROBILINOGEN 1.0 01/10/2015 1754   NITRITE POSITIVE (A) 03/18/2020 1217   LEUKOCYTESUR SMALL (A) 03/18/2020 1217   Sepsis Labs: @LABRCNTIP (procalcitonin:4,lacticidven:4) ) Recent Results (from the past 240 hour(s))  SARS Coronavirus 2 by RT PCR (hospital order, performed in [place] hospital lab) Nasopharyngeal Nasopharyngeal Swab     Status: None   Collection Time: 03/17/20  7:29 PM   Specimen: Nasopharyngeal Swab  Result Value Ref Range Status   SARS Coronavirus 2 NEGATIVE NEGATIVE Final    Comment: (  NOTE) SARS-CoV-2 target nucleic acids are NOT DETECTED. The SARS-CoV-2 RNA is generally detectable in upper and lower respiratory specimens during the acute phase of infection. The lowest concentration of SARS-CoV-2 viral copies this assay can detect is 250 copies / mL. A negative result does not preclude SARS-CoV-2 infection and should not be used as the sole basis for treatment or other patient management decisions.  A negative result may occur with improper specimen collection / handling, submission of specimen other than nasopharyngeal swab, presence of viral mutation(s) within the areas targeted by this assay, and inadequate number of viral copies (<250 copies / mL). A negative result must be combined with clinical observations, patient history, and epidemiological information. Fact  Sheet for Patients:   StrictlyIdeas.no Fact Sheet for Healthcare Providers: BankingDealers.co.za This test is not yet approved or cleared  by the [place] FDA and has been authorized for detection and/or diagnosis of SARS-CoV-2 by FDA under an Emergency Use Authorization (EUA).  This EUA will remain in effect (meaning this test can be used) for the duration of the COVID-19 declaration under Section 564(b)(1) of the Act, 21 U.S.C. section 360bbb-3(b)(1), unless the authorization is terminated or revoked sooner. Performed at [facility], [address] [place], [place] [address]   MRSA PCR Screening     Status: Abnormal   Collection Time: 03/18/20  8:10 AM   Specimen: Nasal Mucosa; Nasopharyngeal  Result Value Ref Range Status   MRSA by PCR CORRECTED RESULTS CALLED TO: (A) NEGATIVE Corrected    Comment: Willeen Cass, RN AT 1415 ON 03/18/2020 BY BAUGHAM,M. THE PREVIOUSLY REPORTED POSITIVE RESULT WAS IN ERROR. PLEASE REFER TO NEW MRSPCR FOR RESULTS.        The GeneXpert MRSA Assay (FDA approved for NASAL specimens only), is one component of a comprehensive MRSA colonization surveillance program. It is not intended to diagnose MRSA infection nor to guide or monitor treatment for MRSA infections. Performed at [facility], [address], [place], [place] [address] CORRECTED ON 05/11 AT 1423: PREVIOUSLY REPORTED AS POSITIVE RESULT CALLED TO, READ BACK BY AND VERIFIED WITH: BENGSTON,P. AT 1402 ON 03/18/2020 BY BAUGHAM,M        The GeneXpert MRSA Assay (FDA approved for NASAL specimens only), is one component of a  comprehensive MRSA colonization surveillance program. It is not intended to diagnose MRSA infection nor to guide or monitor treatment for MRSA infections.   MRSA PCR Screening     Status: None   Collection Time: 03/18/20  8:10 AM  Result Value Ref Range Status   MRSA by PCR NEGATIVE NEGATIVE Final    Comment:        The  GeneXpert MRSA Assay (FDA approved for NASAL specimens only), is one component of a comprehensive MRSA colonization surveillance program. It is not intended to diagnose MRSA infection nor to guide or monitor treatment for MRSA infections. Performed at [facility], [address] [place], [place] [address]   Culture, Urine     Status: Abnormal   Collection Time: 03/18/20 12:17 PM   Specimen: Urine, Random  Result Value Ref Range Status   Specimen Description   Final    URINE, RANDOM Performed at [facility], [address] [place], [place] [address]    Special Requests   Final    NONE Performed at [facility], [address], [place], [place] [address]    Culture MULTIPLE SPECIES PRESENT, SUGGEST RECOLLECTION (A)  Final   Report Status 03/19/2020 FINAL  Final  Scheduled Meds: . amLODipine  5 mg Oral Daily  . atorvastatin  20 mg Oral Daily  . divalproex  500 mg Oral QHS  . heparin  5,000 Units Subcutaneous Q8H  . insulin aspart  0-20 Units Subcutaneous TID WC  . insulin aspart  0-5 Units Subcutaneous QHS  . insulin glargine  20 Units Subcutaneous Daily  . levothyroxine  200 mcg Oral Q0600  . nicotine  21 mg Transdermal Daily  . PARoxetine  40 mg Oral Daily   Continuous Infusions: . 0.9 % NaCl with KCl 20 mEq / L 100 mL/hr at 03/19/20 0616  . cefTRIAXone (ROCEPHIN)  IV Stopped (03/18/20 1826)  . metronidazole Stopped (03/19/20 1130)    Procedures/Studies: CT ABDOMEN PELVIS WO CONTRAST  Result Date: 03/18/2020 CLINICAL DATA:  Emesis, diarrhea.  COPD EXAM: CT ABDOMEN AND PELVIS WITHOUT CONTRAST TECHNIQUE: Multidetector CT imaging of the abdomen and pelvis was performed following the standard protocol without IV contrast. COMPARISON:  None. FINDINGS: Lower chest: Linear thickening at the LEFT lung base is chronic. Hepatobiliary: No focal hepatic lesion. Postcholecystectomy. No biliary dilatation. Pancreas: Pancreas is normal. No ductal dilatation. No pancreatic  inflammation. Spleen: Normal spleen Adrenals/urinary tract: Adrenal glands and kidneys are normal. The ureters and bladder normal. Stomach/Bowel: Stomach, small bowel, appendix, and cecum are normal. Ascending and transverse colon normal. Beginning at the splenic flexure and extending to the proximal sigmoid colon there is submucosal bowel wall edema and thickening (image 101/6/sagittal). Small amount pericolonic fluid extends along the LEFT pericolic gutter (image [id number]). The greatest submucosal edema is seen at the splenic flexure (image 36/2). There is mild stranding in the pericolonic mesentery and fat. There are no diverticula through this region to suggest acute diverticulitis. No pneumatosis or portal venous gas. No perforation Sigmoid colon contains diverticula without acute infection or inflammation. Rectum normal. Vascular/Lymphatic: Dense calcification of the abdominal aorta which is nonaneurysmal. There is calcification of the ostia of the SMA and celiac trunk. IMA is normal noncontrast Reproductive: Uterus and adnexa unremarkable. Other: No free fluid. Musculoskeletal: No aggressive osseous lesion. IMPRESSION: 1. Segment of submucosal edema and pericolonic stranding involving the splenic flexure and descending colon. Findings consistent with segmental colitis. Differential would include ischemic colitis (particularly at this vascular watershed location), inflammatory bowel disease, and drug induced colitis. 2. No pneumatosis, portal venous gas or intraperitoneal free air. 3. Aortic Atherosclerosis (ICD10-I70.0). Electronically Signed   By: Suzy Bouchard M.D.   On: 03/18/2020 16:04    Orson Eva, DO  [facility]  If 7PM-7AM, please contact night-coverage [email or web address] Password [email or web address] 03/19/2020, 5:25 PM   LOS: 2 days

## 2020-03-20 ENCOUNTER — Telehealth: Payer: Self-pay | Admitting: Gastroenterology

## 2020-03-20 DIAGNOSIS — E039 Hypothyroidism, unspecified: Secondary | ICD-10-CM

## 2020-03-20 DIAGNOSIS — K5010 Crohn's disease of large intestine without complications: Secondary | ICD-10-CM

## 2020-03-20 DIAGNOSIS — Z6837 Body mass index (BMI) 37.0-37.9, adult: Secondary | ICD-10-CM

## 2020-03-20 DIAGNOSIS — E66812 Obesity, class 2: Secondary | ICD-10-CM

## 2020-03-20 DIAGNOSIS — R197 Diarrhea, unspecified: Secondary | ICD-10-CM

## 2020-03-20 DIAGNOSIS — E6609 Other obesity due to excess calories: Secondary | ICD-10-CM

## 2020-03-20 LAB — CBC
HCT: 39.8 % (ref 36.0–46.0)
Hemoglobin: 13 g/dL (ref 12.0–15.0)
MCH: 31.1 pg (ref 26.0–34.0)
MCHC: 32.7 g/dL (ref 30.0–36.0)
MCV: 95.2 fL (ref 80.0–100.0)
Platelets: 133 10*3/uL — ABNORMAL LOW (ref 150–400)
RBC: 4.18 MIL/uL (ref 3.87–5.11)
RDW: 13.5 % (ref 11.5–15.5)
WBC: 10.5 10*3/uL (ref 4.0–10.5)
nRBC: 0 % (ref 0.0–0.2)

## 2020-03-20 LAB — MAGNESIUM: Magnesium: 1.9 mg/dL (ref 1.7–2.4)

## 2020-03-20 LAB — BASIC METABOLIC PANEL
Anion gap: 5 (ref 5–15)
BUN: 8 mg/dL (ref 8–23)
CO2: 24 mmol/L (ref 22–32)
Calcium: 7.9 mg/dL — ABNORMAL LOW (ref 8.9–10.3)
Chloride: 109 mmol/L (ref 98–111)
Creatinine, Ser: 1.01 mg/dL — ABNORMAL HIGH (ref 0.44–1.00)
GFR calc Af Amer: >60 mL/min (ref >60)
GFR calc non Af Amer: 58 mL/min — ABNORMAL LOW (ref >60)
Glucose, Bld: 159 mg/dL — ABNORMAL HIGH (ref 70–99)
Potassium: 4.9 mmol/L (ref 3.5–5.1)
Sodium: 138 mmol/L (ref 135–145)

## 2020-03-20 LAB — GLUCOSE, CAPILLARY
Glucose-Capillary: 150 mg/dL — ABNORMAL HIGH (ref 70–99)
Glucose-Capillary: 182 mg/dL — ABNORMAL HIGH (ref 70–99)
Glucose-Capillary: 192 mg/dL — ABNORMAL HIGH (ref 70–99)

## 2020-03-20 MED ORDER — NICOTINE 21 MG/24HR TD PT24: 21.0000 mg | MEDICATED_PATCH | Freq: Every day | TRANSDERMAL | 0 refills | Status: DC

## 2020-03-20 MED ORDER — AMOXICILLIN-POT CLAVULANATE 875-125 MG PO TABS
1.0000 | ORAL_TABLET | Freq: Two times a day (BID) | ORAL | 0 refills | Status: AC
Start: 2020-03-20 — End: 2020-03-27

## 2020-03-20 MED ORDER — INSULIN GLARGINE 100 UNIT/ML ~~LOC~~ SOLN: 45.0000 [IU] | Freq: Two times a day (BID) | SUBCUTANEOUS | 11 refills | Status: DC

## 2020-03-20 MED ORDER — AMLODIPINE BESYLATE 5 MG PO TABS: 5.0000 mg | ORAL_TABLET | Freq: Every day | ORAL | 1 refills | Status: DC

## 2020-03-20 NOTE — Progress Notes (Signed)
Subjective:  Feeling better today. Abdominal pain and diarrhea better.   Objective: Vital signs in last 24 hours: Temp:  [98.2 F (36.8 C)-98.8 F (37.1 C)] 98.8 F (37.1 C) (05/13 0539) Pulse Rate:  [81-85] 81 (05/13 0539) Resp:  [16-18] 18 (05/13 0539) BP: (127-141)/(59-63) 127/61 (05/13 0539) SpO2:  [92 %-95 %] 95 % (05/13 0539) Last BM Date: 03/19/20 General:   Alert,  Well-developed, well-nourished, pleasant and cooperative in NAD Head:  Normocephalic and atraumatic. Eyes:  Sclera clear, no icterus.  Abdomen:  Soft, nontender and nondistended.  Normal bowel sounds, without guarding, and without rebound.   Extremities:  Without clubbing, deformity or edema. Neurologic:  Alert and  oriented x4;  grossly normal neurologically. Skin:  Intact without significant lesions or rashes. Psych:  Alert and cooperative. Normal mood and affect.  Intake/Output from previous day: 05/12 0701 - 05/13 0700 In: 2691.2 [P.O.:960; I.V.:1581.2; IV Piggyback:150] Out: -  Intake/Output this shift: Total I/O In: -  Out: 150 [Urine:150]  Lab Results: CBC Recent Labs    03/18/20 0731 03/19/20 0403 03/20/20 0530  WBC 13.4* 14.7* 10.5  HGB 14.3 13.2 13.0  HCT 42.6 39.9 39.8  MCV 93.6 95.0 95.2  PLT 162 142* 133*   BMET Recent Labs    03/18/20 0731 03/19/20 0403 03/20/20 0530  NA 138 136 138  K 3.6 3.9 4.9  CL 99 105 109  CO2 28 22 24   GLUCOSE 139* 127* 159*  BUN 17 9 8   CREATININE 1.11* 0.82 1.01*  CALCIUM 8.3* 7.8* 7.9*   LFTs Recent Labs    03/17/20 1422  BILITOT 1.0  ALKPHOS 70  AST 19  ALT 19  PROT 7.1  ALBUMIN 4.1   Recent Labs    03/17/20 1422  LIPASE 29   PT/INR No results for input(s): LABPROT, INR in the last 72 hours.    Imaging Studies: CT ABDOMEN PELVIS WO CONTRAST  Result Date: 03/18/2020 CLINICAL DATA:  Emesis, diarrhea.  COPD EXAM: CT ABDOMEN AND PELVIS WITHOUT CONTRAST TECHNIQUE: Multidetector CT imaging of the abdomen and pelvis was performed  following the standard protocol without IV contrast. COMPARISON:  None. FINDINGS: Lower chest: Linear thickening at the LEFT lung base is chronic. Hepatobiliary: No focal hepatic lesion. Postcholecystectomy. No biliary dilatation. Pancreas: Pancreas is normal. No ductal dilatation. No pancreatic inflammation. Spleen: Normal spleen Adrenals/urinary tract: Adrenal glands and kidneys are normal. The ureters and bladder normal. Stomach/Bowel: Stomach, small bowel, appendix, and cecum are normal. Ascending and transverse colon normal. Beginning at the splenic flexure and extending to the proximal sigmoid colon there is submucosal bowel wall edema and thickening (image 101/6/sagittal). Small amount pericolonic fluid extends along the LEFT pericolic gutter (image [id number]). The greatest submucosal edema is seen at the splenic flexure (image 36/2). There is mild stranding in the pericolonic mesentery and fat. There are no diverticula through this region to suggest acute diverticulitis. No pneumatosis or portal venous gas. No perforation Sigmoid colon contains diverticula without acute infection or inflammation. Rectum normal. Vascular/Lymphatic: Dense calcification of the abdominal aorta which is nonaneurysmal. There is calcification of the ostia of the SMA and celiac trunk. IMA is normal noncontrast Reproductive: Uterus and adnexa unremarkable. Other: No free fluid. Musculoskeletal: No aggressive osseous lesion. IMPRESSION: 1. Segment of submucosal edema and pericolonic stranding involving the splenic flexure and descending colon. Findings consistent with segmental colitis. Differential would include ischemic colitis (particularly at this vascular watershed location), inflammatory bowel disease, and drug induced colitis. 2. No pneumatosis, portal venous  gas or intraperitoneal free air. 3. Aortic Atherosclerosis (ICD10-I70.0). Electronically Signed   By: Suzy Bouchard M.D.   On: 03/18/2020 16:04  [2  weeks]   Assessment: Pleasant 69 year old female presenting with acute onset nausea, vomiting, abdominal pain, nonbloody diarrhea.  Abdominal pelvic CT on admission revealed segmental wall thickening involving the splenic flexure and descending colon with pericolonic stranding.  No prior history of antibiotic use.  Patient drinks bottled water.  No ill contacts.  Differential diagnosis includes ischemic colitis, foodborne illness.  She also has a history of colonic adenomas, last colonoscopy April 2016, due for surveillance colonoscopy later this year.  Stool studies ordered yesterday but not collected.  Clinically improved today and patient requested discharge.   Plan: 1. Consider completing course of antibiotics.  2. If persistent diarrhea, collect stool studies.  3. Given clinical improvement would consider outpatient colonsscopy for surveillance purposes once acute illness resolves.   Laureen Ochs. Bernarda Caffey [facility] [phone] 5/13/20212:02 PM     LOS: 3 days

## 2020-03-20 NOTE — Telephone Encounter (Signed)
Please schedule hospital follow up in 4-6 weeks for segmental colitis. Consider surveillance colonoscopy.

## 2020-03-20 NOTE — Progress Notes (Signed)
Pt discharged via wheelchair to [place] by staff member. All personal belongings with pt.

## 2020-03-20 NOTE — Discharge Summary (Signed)
Physician Discharge Summary  Claudia Keller [id number] DOB: 1951-07-19 DOA: 03/17/2020  PCP: Rosita Fire, MD  Admit date: 03/17/2020 Discharge date: 03/20/2020  Time spent: 35 minutes  Recommendations for Outpatient Follow-up:  1. Repeat basic metabolic panel to follow electrolytes and renal function 2. Reassess patient CBGs and further adjust hypoglycemic regimen as needed 3. Outpatient follow-up with gastroenterology service for surveillance colonoscopy and further decision regarding treatment for underlying gastroparesis if needed.   Discharge Diagnoses:  Principal Problem:   DKA (diabetic ketoacidoses) ([place]) Active Problems:   Diarrhea   Hypercholesterolemia   Tobacco abuse   Nausea vomiting and diarrhea   Uncontrolled diabetes mellitus with stage 2 chronic kidney disease (HCC)   Essential hypertension, benign   Primary hypothyroidism   Noncompliance with medication regimen   Acute renal failure superimposed on stage 3a chronic kidney disease (HCC)   Ischemic colitis ([place])   Segmental colitis without complication (HCC)   Class 2 obesity due to excess calories with body mass index (BMI) of 37.0 to 37.9 in adult   Discharge Condition: Stable and improved.  Discharged home with instruction to follow-up with PCP in 10 days.  Follow-up with gastroenterology as instructed.  CODE STATUS: Full code.  Diet recommendation: Heart healthy and modified carbohydrate diet.  Filed Weights   03/17/20 1359 03/19/20 0500  Weight: 104.3 kg 104.1 kg    History of present illness:  69 year old female with a history of bipolar disorder, diabetes mellitus type 2, hypertension, hypothyroidism, hyperlipidemia, tobacco abuse, COPD presenting with nausea and vomiting that began on the morning of 03/17/2020. Patient denies any fevers, chills, headache, dysuria, hematuria, hematochezia, melena. She has not had any travels. She denies any sick contacts. She has not eaten any raw or  undercooked foods. She states that she has been poorly compliant with her Lantus as well as her lispro. She states that she misses at least 3 days of Lantus on a weekly basis and only takes the lispro once a day. She states that she often forgets her dosing and falls asleep. She cannot recall the last time she took her Lantus. She complains of periumbilical abdominal pain that began around the same time of her vomiting. There is no hematochezia, melena. She denies any new medications. She appears to have poor insight regarding her medical issues. The patient lives by herself and uses a cane for assistance to ambulate. She has a home health aide that comes Monday through Friday for 5 hours. In the emergency department, the patient was afebrile hemodynamically stable with oxygen saturation 96% room air. BMP showed a sodium 132, serum creatinine 1.54 with anion gap 17. Initial serum glucose was 354. WBC 12.2, hemoglobin 15.9, platelets 194,000. UA showed ketonuria. Beta hydroxy acid was 2.55. The patient was started on fluid resuscitation and IV insulin. She was transitioned to Lantus.  Hospital Course:  DKA type II -patient started on IV insulin with q 1 hour CBG check and q 4 hour BMPs -pt started on aggressive fluid resuscitation -Electrolytes were monitored and repleted as needed. -transitioned to [place] insulin once anion gap closed, CBG was at target with 4 more blood glucose level less than 200 and a bicarb of 19. -diet was advanced and terminated prior to discharge -Continue adjusted dose of Lantus, sliding scale insulin and resumption of Metformin and Tradjenta. -HbA1C--12.3 -Needs close monitoring and aggressive adjustment to hypoglycemic regimen as required.  Pyuria -CT abd/pelvis with abd pain and leukocytosis -urine culture--multiple organisms -No fever, no dysuria.  Colitis -  likely ischemic colitis -5/11 CT abd--Segment of submucosal edema and pericolonic stranding  involving the splenic flexure and descending colon. Findings consistent with segmental colitis. -GI consult appreciated--> stool studies unable to be collected and patient has no longer experienced diarrhea. -WBCs has trended down and within normal limits at time of discharge -Complete a total of 10 days with antibiotics (at discharge 7 more days of Augmentin pending). -Outpatient follow-up with GI for colonoscopy surveillance.  Intractable nausea/ vomiting -No further nausea vomiting prior to discharge -Tolerating diet -Most likely a combination of acute colitis and also gastroparesis with uncontrolled diabetes.  Poor patient compliance -The patient has poor insight regarding her medical issues -She will benefit from home health RN upon discharge. -Close monitoring by PCP recommended.  Acute on chronic renal failure--CKD stage IIIa -Improving back to baseline at discharge -Repeat basic metabolic panel follow-up visit to reassess renal function and stability. -Secondary to volume depletion and continue use of nephrotoxic agents. -At discharge safe to resume the use of lisinopril and Lasix.  Essential hypertension -Continue amlodipine -Now the patient renal function is back to baseline we will resume the use of lisinopril and Lasix -Advised to maintain adequate hydration and to follow heart healthy diet.  Uncontrolled diabetes mellitus type 2 with hyperglycemia -Hemoglobin A1c--12.3 -Lantus dose has been adjusted to 45 units twice daily along with sliding scale insulin for meal coverage -resume tradjenta and Metformin. -Close CBG monitoring and further adjustment to hypoglycemic regimen as required. -Modified carbohydrate diet has been encouraged.  Anxiety/depression -No suicidal ideation or hallucination -Continue Paxil, Depakote, Remeron  Hypothyroidism -Continue Synthroid -Follow TSH in 3 months.  Tobacco abuse -Tobacco cessation discussed -Continue NicoDerm  patch at discharge  Hyperlipidemia -Continue statin -Heart healthy diet has been discussed with patient  Procedures:  See below for x-ray reports  Consultations:  GI service  Discharge Exam: Vitals:   03/19/20 2054 03/20/20 0539  BP: (!) 141/59 127/61  Pulse: 85 81  Resp: 18 18  Temp: 98.5 F (36.9 C) 98.8 F (37.1 C)  SpO2: 93% 95%    General: Patient is alert, awake and oriented x3; in no acute distress.  Expressed to have been tolerating diet and no further nausea, vomiting or significant diarrhea.  She wants to go home. Cardiovascular: S1 and S2, no rubs, no gallops, no JVD. Respiratory: Clear to auscultation bilaterally; normal respiratory effort.  Good oxygen saturation on room air Abdomen: Soft, nontender, positive bowel sounds, no guarding. Extremities: No cyanosis or clubbing.  Discharge Instructions   Discharge Instructions    Diet - low sodium heart healthy   Complete by: As directed    Diet Carb Modified   Complete by: As directed    Discharge instructions   Complete by: As directed    Follow modified carbohydrate diet -He should to check his CBGs and be compliant with hypoglycemic regimen Arrange follow-up with PCP in 10 days Maintain adequate hydration Follow-up with gastroenterology service as instructed   Increase activity slowly   Complete by: As directed      Allergies as of 03/20/2020   No Known Allergies     Medication List    STOP taking these medications   Aleve 220 MG tablet Generic drug: naproxen sodium   metFORMIN 500 MG tablet Commonly known as: GLUCOPHAGE     TAKE these medications   amLODipine 5 MG tablet Commonly known as: NORVASC Take 1 tablet (5 mg total) by mouth daily. Start taking on: Mar 21, 2020 What changed:  medication strength  how much to take   amoxicillin-clavulanate 875-125 MG tablet Commonly known as: Augmentin Take 1 tablet by mouth 2 (two) times daily for 7 days.   Calcium 600-200 MG-UNIT  tablet Take 1 tablet by mouth daily.   colestipol 1 g tablet Commonly known as: COLESTID TAKE 2 TABLETS DAILY FOR DIARRHEA. DO NOT TAKE WITHIN 2 HOURS OF OTHER ORAL MEDICATIONS. HOLD FOR CONSTIPATION. What changed: See the new instructions.   divalproex 250 MG DR tablet Commonly known as: DEPAKOTE Take 1 tablet (250 mg total) by mouth daily. What changed: Another medication with the same name was removed. Continue taking this medication, and follow the directions you see here.   furosemide 20 MG tablet Commonly known as: LASIX Take 20 mg by mouth daily.   gabapentin 100 MG capsule Commonly known as: NEURONTIN Take 100 mg by mouth 3 (three) times daily.   insulin glargine 100 UNIT/ML injection Commonly known as: LANTUS Inject 0.45 mLs (45 Units total) into the skin 2 (two) times daily. What changed:   how much to take  when to take this   insulin lispro 100 UNIT/ML injection Commonly known as: HUMALOG Inject 15-24 Units into the skin 3 (three) times daily with meals. Per sliding scale. 151-200=18 units; 201-250= 9 units; 251-300=20 units; 301-350=21 units; 351-400= 22 units; 401-450=23 units; 500-551=24 units.   levothyroxine 200 MCG tablet Commonly known as: SYNTHROID Take 200 mcg by mouth daily before breakfast.   linagliptin 5 MG Tabs tablet Commonly known as: TRADJENTA Take 5 mg by mouth daily.   lisinopril 20 MG tablet Commonly known as: ZESTRIL Take 20 mg by mouth daily.   loperamide 2 MG capsule Commonly known as: IMODIUM Take by mouth as needed for diarrhea or loose stools.   mirtazapine 7.5 MG tablet Commonly known as: REMERON Take 1 tablet (7.5 mg total) by mouth at bedtime.   nicotine 21 mg/24hr patch Commonly known as: NICODERM CQ - dosed in mg/24 hours Place 1 patch (21 mg total) onto the skin daily. Start taking on: Mar 21, 2020   ondansetron 4 MG tablet Commonly known as: ZOFRAN Take 1 tablet (4 mg total) by mouth every 8 (eight) hours as  needed for nausea or vomiting.   pantoprazole 40 MG tablet Commonly known as: PROTONIX TAKE 1 TABLET BY MOUTH ONCE DAILY 30 MINTUES BEFORE BREAKFAST. What changed: See the new instructions.   PARoxetine 40 MG tablet Commonly known as: PAXIL Take 1 tablet (40 mg total) by mouth daily.   ProAir HFA 108 (90 Base) MCG/ACT inhaler Generic drug: albuterol Inhale 1-2 puffs into the lungs every 6 (six) hours as needed for wheezing or shortness of breath.   QUEtiapine 100 MG tablet Commonly known as: SEROQUEL Take 1 tablet (100 mg total) by mouth at bedtime.   simvastatin 40 MG tablet Commonly known as: ZOCOR Take 40 mg by mouth at bedtime.      No Known Allergies Follow-up Information    [facility] Follow up.   Specialty: [place] Why:   PT/RN       Rosita Fire, MD. Schedule an appointment as soon as possible for a visit in 10 day(s).   Specialty: Internal Medicine Contact information: [place] [address] [phone]           The results of significant diagnostics from this hospitalization (including imaging, microbiology, ancillary and laboratory) are listed below for reference.    Significant Diagnostic Studies: CT ABDOMEN  PELVIS WO CONTRAST  Result Date: 03/18/2020 CLINICAL DATA:  Emesis, diarrhea.  COPD EXAM: CT ABDOMEN AND PELVIS WITHOUT CONTRAST TECHNIQUE: Multidetector CT imaging of the abdomen and pelvis was performed following the standard protocol without IV contrast. COMPARISON:  None. FINDINGS: Lower chest: Linear thickening at the LEFT lung base is chronic. Hepatobiliary: No focal hepatic lesion. Postcholecystectomy. No biliary dilatation. Pancreas: Pancreas is normal. No ductal dilatation. No pancreatic inflammation. Spleen: Normal spleen Adrenals/urinary tract: Adrenal glands and kidneys are normal. The ureters and bladder normal. Stomach/Bowel: Stomach, small bowel, appendix, and cecum are  normal. Ascending and transverse colon normal. Beginning at the splenic flexure and extending to the proximal sigmoid colon there is submucosal bowel wall edema and thickening (image 101/6/sagittal). Small amount pericolonic fluid extends along the LEFT pericolic gutter (image [id number]). The greatest submucosal edema is seen at the splenic flexure (image 36/2). There is mild stranding in the pericolonic mesentery and fat. There are no diverticula through this region to suggest acute diverticulitis. No pneumatosis or portal venous gas. No perforation Sigmoid colon contains diverticula without acute infection or inflammation. Rectum normal. Vascular/Lymphatic: Dense calcification of the abdominal aorta which is nonaneurysmal. There is calcification of the ostia of the SMA and celiac trunk. IMA is normal noncontrast Reproductive: Uterus and adnexa unremarkable. Other: No free fluid. Musculoskeletal: No aggressive osseous lesion. IMPRESSION: 1. Segment of submucosal edema and pericolonic stranding involving the splenic flexure and descending colon. Findings consistent with segmental colitis. Differential would include ischemic colitis (particularly at this vascular watershed location), inflammatory bowel disease, and drug induced colitis. 2. No pneumatosis, portal venous gas or intraperitoneal free air. 3. Aortic Atherosclerosis (ICD10-I70.0). Electronically Signed   By: Suzy Bouchard M.D.   On: 03/18/2020 16:04    Microbiology: Recent Results (from the past 240 hour(s))  SARS Coronavirus 2 by RT PCR (hospital order, performed in [facility] hospital lab) Nasopharyngeal Nasopharyngeal Swab     Status: None   Collection Time: 03/17/20  7:29 PM   Specimen: Nasopharyngeal Swab  Result Value Ref Range Status   SARS Coronavirus 2 NEGATIVE NEGATIVE Final    Comment: (NOTE) SARS-CoV-2 target nucleic acids are NOT DETECTED. The SARS-CoV-2 RNA is generally detectable in upper and lower respiratory specimens during  the acute phase of infection. The lowest concentration of SARS-CoV-2 viral copies this assay can detect is 250 copies / mL. A negative result does not preclude SARS-CoV-2 infection and should not be used as the sole basis for treatment or other patient management decisions.  A negative result may occur with improper specimen collection / handling, submission of specimen other than nasopharyngeal swab, presence of viral mutation(s) within the areas targeted by this assay, and inadequate number of viral copies (<250 copies / mL). A negative result must be combined with clinical observations, patient history, and epidemiological information. Fact Sheet for Patients:   StrictlyIdeas.no Fact Sheet for Healthcare Providers: BankingDealers.co.za This test is not yet approved or cleared  by the [place] FDA and has been authorized for detection and/or diagnosis of SARS-CoV-2 by FDA under an Emergency Use Authorization (EUA).  This EUA will remain in effect (meaning this test can be used) for the duration of the COVID-19 declaration under Section 564(b)(1) of the Act, 21 U.S.C. section 360bbb-3(b)(1), unless the authorization is terminated or revoked sooner. Performed at [facility], [address], [place], [place] [address]   MRSA PCR Screening     Status: Abnormal   Collection Time: 03/18/20  8:10 AM   Specimen:  Nasal Mucosa; Nasopharyngeal  Result Value Ref Range Status   MRSA by PCR CORRECTED RESULTS CALLED TO: (A) NEGATIVE Corrected    Comment: Willeen Cass, RN AT 1415 ON 03/18/2020 BY BAUGHAM,M. THE PREVIOUSLY REPORTED POSITIVE RESULT WAS IN ERROR. PLEASE REFER TO NEW MRSPCR FOR RESULTS.        The GeneXpert MRSA Assay (FDA approved for NASAL specimens only), is one component of a comprehensive MRSA colonization surveillance program. It is not intended to diagnose MRSA infection nor to guide or monitor treatment for MRSA  infections. Performed at [facility], [address] [place], [place] [address] CORRECTED ON 05/11 AT 1423: PREVIOUSLY REPORTED AS POSITIVE RESULT CALLED TO, READ BACK BY AND VERIFIED WITH: BENGSTON,P. AT 1402 ON 03/18/2020 BY BAUGHAM,M        The GeneXpert MRSA Assay (FDA approved for NASAL specimens only), is one component of a  comprehensive MRSA colonization surveillance program. It is not intended to diagnose MRSA infection nor to guide or monitor treatment for MRSA infections.   MRSA PCR Screening     Status: None   Collection Time: 03/18/20  8:10 AM  Result Value Ref Range Status   MRSA by PCR NEGATIVE NEGATIVE Final    Comment:        The GeneXpert MRSA Assay (FDA approved for NASAL specimens only), is one component of a comprehensive MRSA colonization surveillance program. It is not intended to diagnose MRSA infection nor to guide or monitor treatment for MRSA infections. Performed at [facility], [address] [place], [place] [address]   Culture, Urine     Status: Abnormal   Collection Time: 03/18/20 12:17 PM   Specimen: Urine, Random  Result Value Ref Range Status   Specimen Description   Final    URINE, RANDOM Performed at [facility], [address] [place], [place] [address]    Special Requests   Final    NONE Performed at [facility], [address] [place], [place] [address]    Culture MULTIPLE SPECIES PRESENT, SUGGEST RECOLLECTION (A)  Final   Report Status 03/19/2020 FINAL  Final     Labs: Basic Metabolic Panel: Recent Labs  Lab 03/17/20 2305 03/18/20 0337 03/18/20 0731 03/19/20 0403 03/20/20 0530  NA 139 141 138 136 138  K 3.7 3.8 3.6 3.9 4.9  CL 102 101 99 105 109  CO2 26 29 28 22 24   GLUCOSE 155* 101* 139* 127* 159*  BUN 19 18 17 9 8   CREATININE 1.22* 1.18* 1.11* 0.82 1.01*  CALCIUM 8.3* 8.3* 8.3* 7.8* 7.9*  MG  --   --  1.1* 2.5* 1.9   Liver Function Tests: Recent Labs  Lab 03/17/20 1422  AST 19  ALT 19  ALKPHOS 70  BILITOT  1.0  PROT 7.1  ALBUMIN 4.1   Recent Labs  Lab 03/17/20 1422  LIPASE 29   CBC: Recent Labs  Lab 03/17/20 1422 03/18/20 0731 03/19/20 0403 03/20/20 0530  WBC 12.2* 13.4* 14.7* 10.5  HGB 15.9* 14.3 13.2 13.0  HCT 47.6* 42.6 39.9 39.8  MCV 92.4 93.6 95.0 95.2  PLT 194 162 142* 133*   CBG: Recent Labs  Lab 03/19/20 1623 03/19/20 2058 03/20/20 0828 03/20/20 1205 03/20/20 1617  GLUCAP 139* 136* 150* 182* 192*     Signed:  Barton Dubois MD.  [facility] 03/20/2020, 5:14 PM

## 2020-03-20 NOTE — Progress Notes (Signed)
Pt's IV site leaking, infiltrated with only slight swelling noted at site. IV removed and restarted in left inner forearm without difficulty. Pt states she is feeling much better, wants to go home. Denies any abd pain, n/v or diarrhea. Tolerated am po without difficulty.

## 2020-03-21 ENCOUNTER — Encounter: Payer: Self-pay | Admitting: Internal Medicine

## 2020-03-21 NOTE — Telephone Encounter (Signed)
Patient scheduled and letter sent  °

## 2020-03-24 ENCOUNTER — Other Ambulatory Visit: Payer: Self-pay | Admitting: *Deleted

## 2020-03-24 NOTE — Patient Outreach (Signed)
[facility]) Care Management  03/24/2020  Claudia Keller Apr 19, 1951 [id number]   EMMI-GENERAL DISCHARGE-RESOLVED RED ON EMMI ALERT Day #1 Date:03/23/2020 Red Alert Reason: No follow up appointment  Outreach #1 RN spoke with pt today who confirm she has a follow up appointment with Dr. Legrand Rams on 5/18. Pt has already had a post hospital telephone call from her GI provider also. No other issues as the above emmi has been resolved.  Plan: Will close this case with no further needs at this time.  Raina Mina, RN Care Management Coordinator [place] Office [phone]

## 2020-03-25 ENCOUNTER — Ambulatory Visit (INDEPENDENT_AMBULATORY_CARE_PROVIDER_SITE_OTHER): Payer: Medicare Other | Admitting: Clinical

## 2020-03-25 ENCOUNTER — Other Ambulatory Visit: Payer: Self-pay

## 2020-03-25 DIAGNOSIS — F3181 Bipolar II disorder: Secondary | ICD-10-CM | POA: Diagnosis not present

## 2020-03-25 DIAGNOSIS — F4310 Post-traumatic stress disorder, unspecified: Secondary | ICD-10-CM | POA: Diagnosis not present
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
## 2020-04-15 DIAGNOSIS — R32 Unspecified urinary incontinence: Secondary | ICD-10-CM | POA: Diagnosis not present

## 2020-04-17 ENCOUNTER — Other Ambulatory Visit (HOSPITAL_COMMUNITY): Payer: Self-pay | Admitting: Internal Medicine

## 2020-04-17 DIAGNOSIS — Z1331 Encounter for screening for depression: Secondary | ICD-10-CM | POA: Diagnosis not present

## 2020-04-17 DIAGNOSIS — F17210 Nicotine dependence, cigarettes, uncomplicated: Secondary | ICD-10-CM | POA: Diagnosis not present

## 2020-04-17 DIAGNOSIS — I10 Essential (primary) hypertension: Secondary | ICD-10-CM | POA: Diagnosis not present

## 2020-04-17 DIAGNOSIS — F319 Bipolar disorder, unspecified: Secondary | ICD-10-CM | POA: Diagnosis not present

## 2020-04-17 DIAGNOSIS — Z1389 Encounter for screening for other disorder: Secondary | ICD-10-CM | POA: Diagnosis not present

## 2020-04-17 DIAGNOSIS — Z0001 Encounter for general adult medical examination with abnormal findings: Secondary | ICD-10-CM | POA: Diagnosis not present

## 2020-04-17 DIAGNOSIS — Z1231 Encounter for screening mammogram for malignant neoplasm of breast: Secondary | ICD-10-CM

## 2020-04-17 DIAGNOSIS — E1165 Type 2 diabetes mellitus with hyperglycemia: Secondary | ICD-10-CM | POA: Diagnosis not present

## 2020-04-29 ENCOUNTER — Ambulatory Visit: Payer: Medicare Other | Admitting: Gastroenterology

## 2020-05-17 DIAGNOSIS — E039 Hypothyroidism, unspecified: Secondary | ICD-10-CM | POA: Diagnosis not present

## 2020-05-17 DIAGNOSIS — E1165 Type 2 diabetes mellitus with hyperglycemia: Secondary | ICD-10-CM | POA: Diagnosis not present

## 2020-05-22 ENCOUNTER — Telehealth: Payer: Self-pay | Admitting: Emergency Medicine

## 2020-05-22 DIAGNOSIS — R112 Nausea with vomiting, unspecified: Secondary | ICD-10-CM

## 2020-05-22 DIAGNOSIS — K580 Irritable bowel syndrome with diarrhea: Secondary | ICD-10-CM

## 2020-05-22 DIAGNOSIS — R197 Diarrhea, unspecified: Secondary | ICD-10-CM

## 2020-05-22 NOTE — Telephone Encounter (Signed)
Pt is requesting a refill on colestipol 1 gram tab and pantoprazole 40mg  tab please send into [facility]

## 2020-05-23 MED ORDER — COLESTIPOL HCL 1 G PO TABS: ORAL_TABLET | ORAL | 11 refills | Status: DC

## 2020-05-23 MED ORDER — PANTOPRAZOLE SODIUM 40 MG PO TBEC: DELAYED_RELEASE_TABLET | ORAL | 11 refills | Status: DC

## 2020-05-23 NOTE — Addendum Note (Signed)
Addended by: Gordy Levan, Blessings Inglett A on: 05/23/2020 12:17 PM   Modules accepted: Orders

## 2020-05-23 NOTE — Telephone Encounter (Signed)
Rx sent 

## 2020-05-26 ENCOUNTER — Other Ambulatory Visit (HOSPITAL_COMMUNITY): Payer: Self-pay | Admitting: Psychiatry

## 2020-05-26 DIAGNOSIS — F3181 Bipolar II disorder: Secondary | ICD-10-CM

## 2020-05-26 DIAGNOSIS — F4310 Post-traumatic stress disorder, unspecified: Secondary | ICD-10-CM

## 2020-05-26 MED ORDER — QUETIAPINE FUMARATE 100 MG PO TABS: 100.0000 mg | ORAL_TABLET | Freq: Every day | ORAL | 0 refills | Status: DC

## 2020-05-26 MED ORDER — MIRTAZAPINE 7.5 MG PO TABS: 7.5000 mg | ORAL_TABLET | Freq: Every day | ORAL | 0 refills | Status: DC

## 2020-05-26 MED ORDER — DIVALPROEX SODIUM 250 MG PO DR TAB: 250.0000 mg | DELAYED_RELEASE_TABLET | Freq: Every day | ORAL | 0 refills | Status: DC

## 2020-05-26 MED ORDER — DIVALPROEX SODIUM ER 500 MG PO TB24: 1000.0000 mg | ORAL_TABLET | Freq: Every evening | ORAL | 0 refills | Status: DC

## 2020-05-26 MED ORDER — PAROXETINE HCL 40 MG PO TABS: 40.0000 mg | ORAL_TABLET | Freq: Every day | ORAL | 0 refills | Status: DC

## 2020-05-29 ENCOUNTER — Other Ambulatory Visit: Payer: Self-pay

## 2020-05-29 ENCOUNTER — Ambulatory Visit (HOSPITAL_COMMUNITY)
Admission: RE | Admit: 2020-05-29 | Discharge: 2020-05-29 | Disposition: A | Discharge status: Home / Self Care | Payer: Medicare HMO | Source: Ambulatory Visit | Attending: Internal Medicine | Admitting: Internal Medicine

## 2020-05-29 DIAGNOSIS — Z1231 Encounter for screening mammogram for malignant neoplasm of breast: Secondary | ICD-10-CM | POA: Insufficient documentation

## 2020-05-29 IMAGING — MG DIGITAL SCREENING BILAT W/ TOMO W/ CAD
6 of 12 series · 6 of 36 positions shown · non-contrast
Comparison: Previous exam(s).

CLINICAL DATA: Screening. LEFT excisional biopsy for atypical
ductal hyperplasia in [[facility]]

EXAM:
DIGITAL SCREENING BILATERAL MAMMOGRAM WITH TOMO AND CAD

[L MLO synth-2D (1 of 2)]
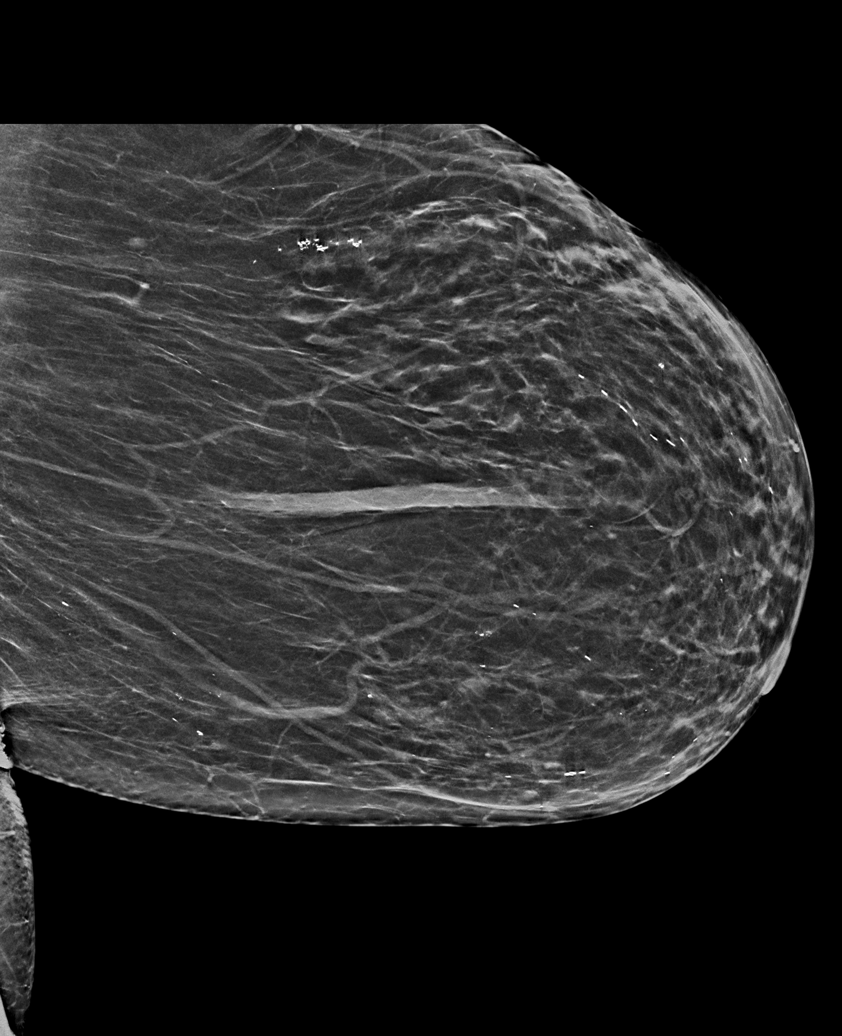

[R MLO synth-2D (1 of 2)]
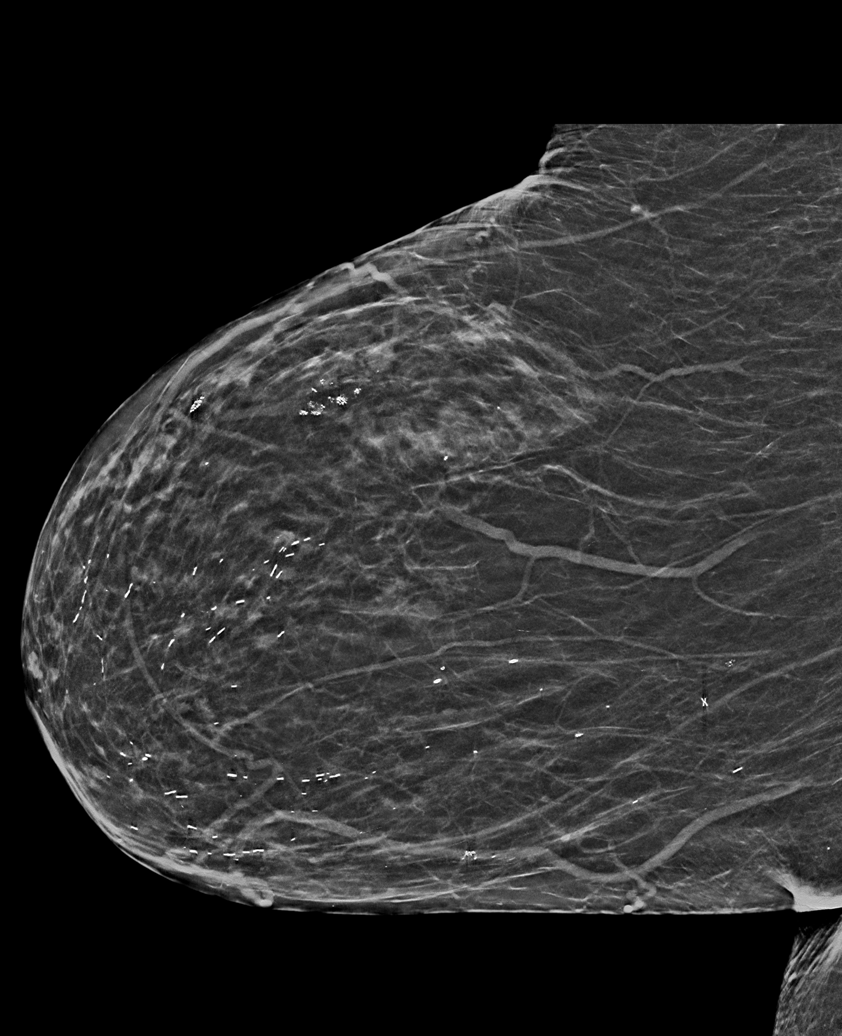

[L CC synth-2D]
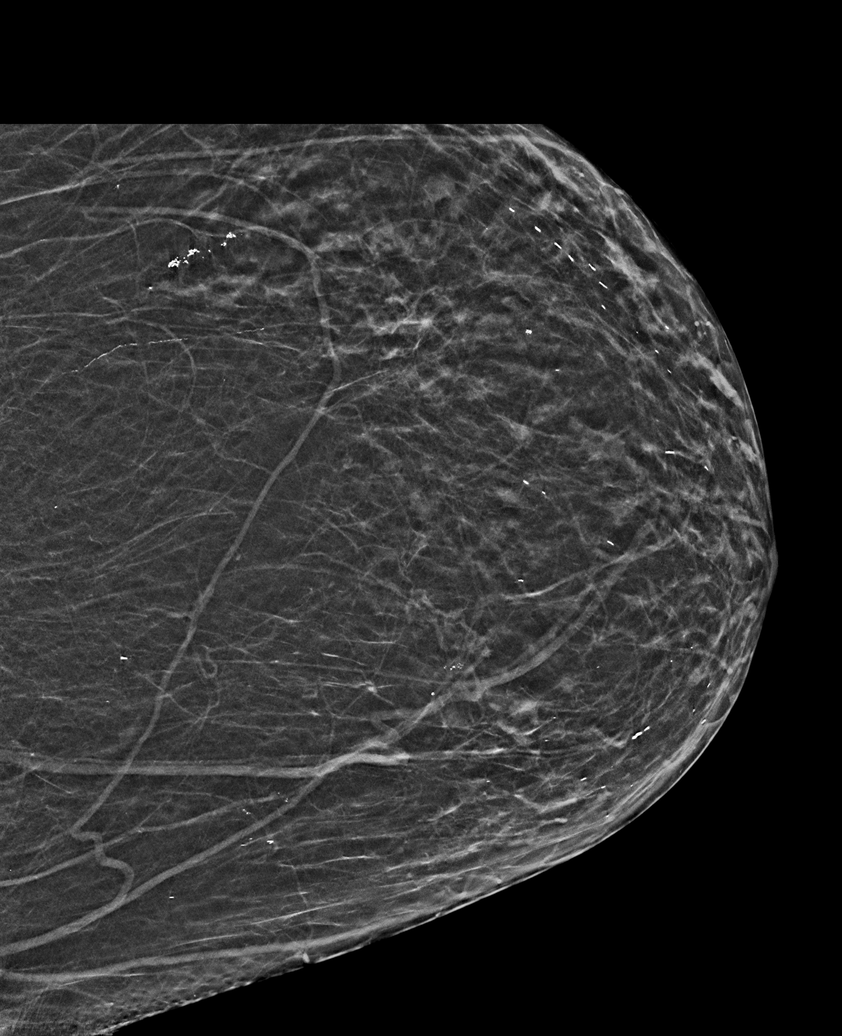

[R CC synth-2D]
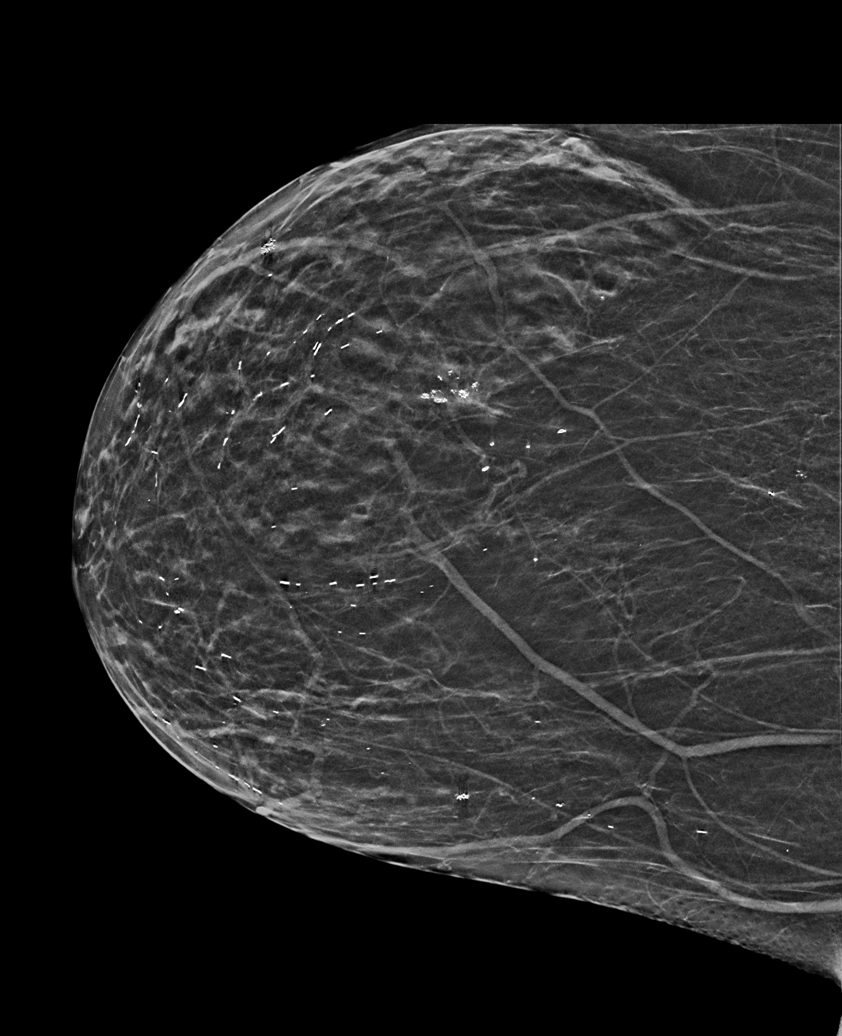

[R MLO synth-2D (2 of 2)]
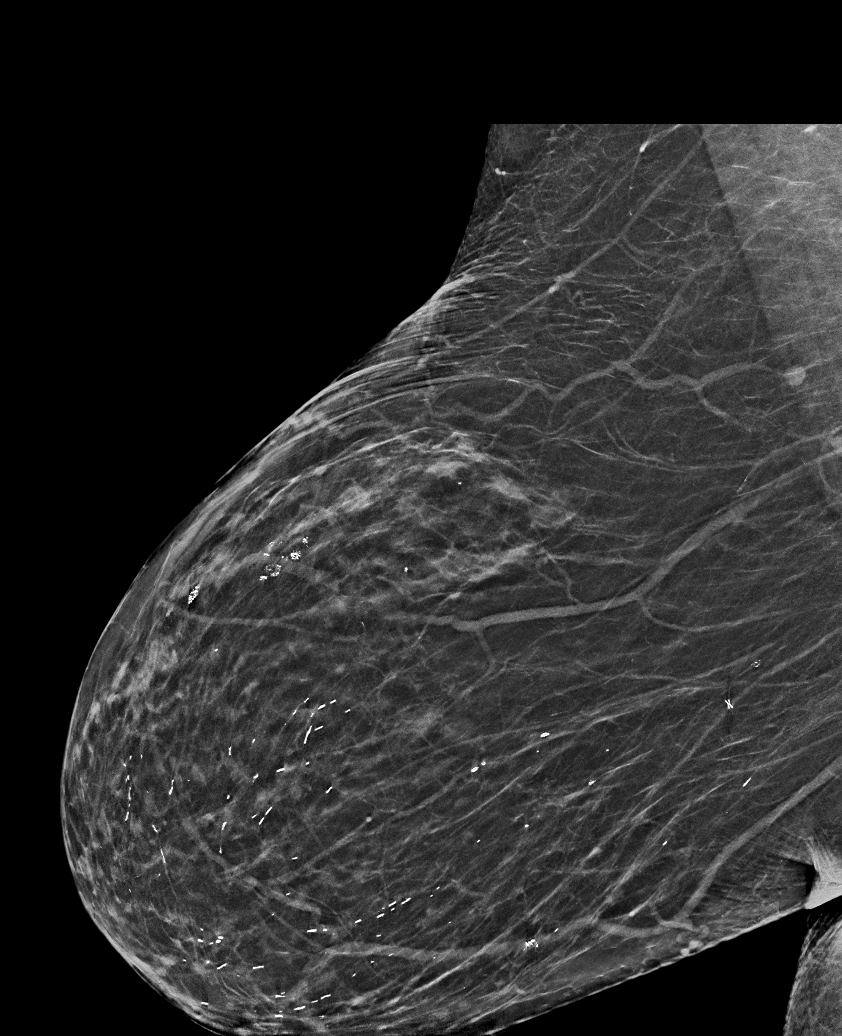

[L MLO synth-2D (2 of 2)]
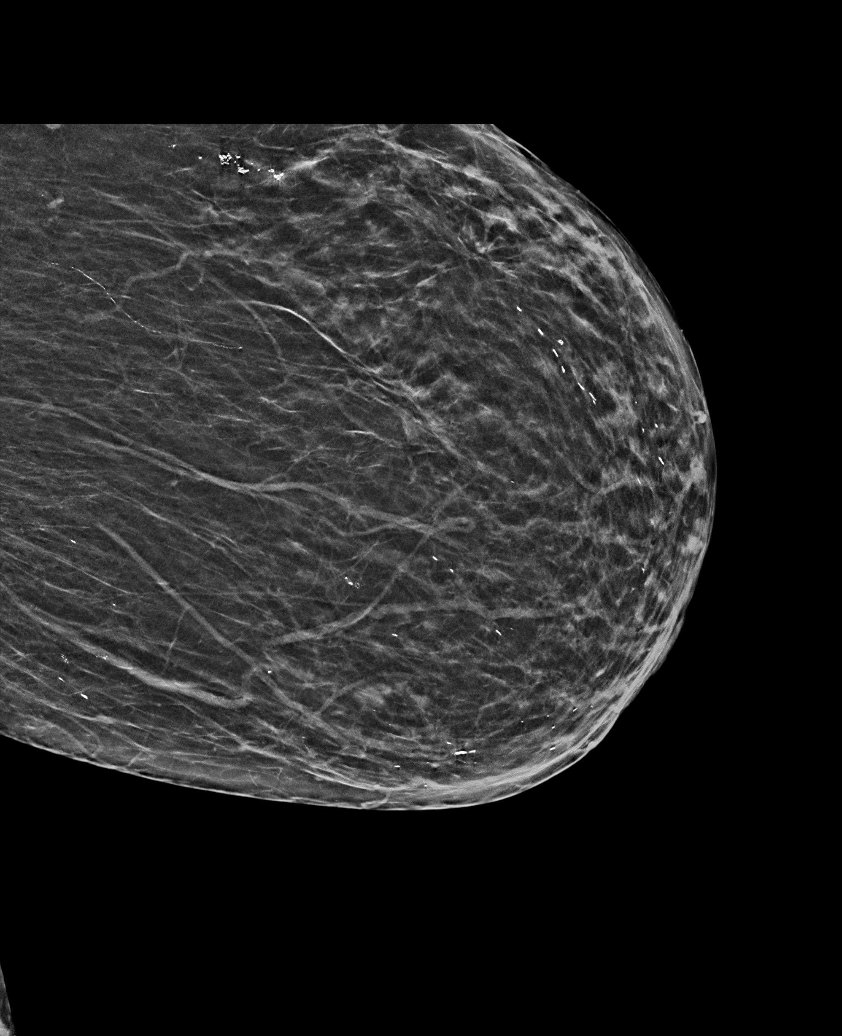

[6 of 36 positions shown; findings below may reference images not displayed]

ACR Breast Density Category b: There are scattered areas of
fibroglandular density.
FINDINGS: There are no findings suspicious for malignancy. There is density
and architectural distortion within the LEFT breast, consistent with
postsurgical changes. These are stable in comparison to prior.
Images were processed with CAD.
IMPRESSION: No mammographic evidence of malignancy. A result letter of this
screening mammogram will be mailed directly to the patient.

RECOMMENDATION:
Screening mammogram in one year. (Code:[KZ])

BI-RADS CATEGORY  2: Benign.

## 2020-06-03 ENCOUNTER — Telehealth (HOSPITAL_COMMUNITY): Payer: Self-pay | Admitting: *Deleted

## 2020-06-03 NOTE — Telephone Encounter (Signed)
[facility] pharmacy called and [facility] to get some information about patient medication. Per Lennette Bihari with [facility], they wanted to know if patient was taking 250 mg of Depakote and the 500 mg. Per Lennette Bihari they have already sent out the 250 mg or the Depakote and did not sent out the 500 mg yet. Spoke with pharmacist Rob and informed him that per provider's note from 02-27-2020 patient was also suppose to take ((2) 500 mg)1000 mg every evening. Per Rob he fixed that in their system and will have that sent out to the pharmacy.

## 2020-06-05 ENCOUNTER — Telehealth (HOSPITAL_COMMUNITY): Payer: Medicare Other | Admitting: Psychiatry

## 2020-06-17 DIAGNOSIS — E1165 Type 2 diabetes mellitus with hyperglycemia: Secondary | ICD-10-CM | POA: Diagnosis not present

## 2020-06-17 DIAGNOSIS — J449 Chronic obstructive pulmonary disease, unspecified: Secondary | ICD-10-CM | POA: Diagnosis not present

## 2020-06-25 ENCOUNTER — Encounter: Payer: Self-pay | Admitting: Internal Medicine

## 2020-06-25 ENCOUNTER — Ambulatory Visit: Payer: Medicare HMO | Admitting: Gastroenterology
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
## 2020-06-30 ENCOUNTER — Other Ambulatory Visit: Payer: Self-pay

## 2020-06-30 ENCOUNTER — Telehealth (INDEPENDENT_AMBULATORY_CARE_PROVIDER_SITE_OTHER): Payer: Medicare HMO | Admitting: Psychiatry

## 2020-06-30 ENCOUNTER — Encounter (HOSPITAL_COMMUNITY): Payer: Self-pay | Admitting: Psychiatry

## 2020-06-30 DIAGNOSIS — F3181 Bipolar II disorder: Secondary | ICD-10-CM

## 2020-06-30 DIAGNOSIS — F4310 Post-traumatic stress disorder, unspecified: Secondary | ICD-10-CM

## 2020-06-30 MED ORDER — PAROXETINE HCL 40 MG PO TABS: 40.0000 mg | ORAL_TABLET | Freq: Every day | ORAL | 0 refills | Status: DC

## 2020-06-30 MED ORDER — MIRTAZAPINE 7.5 MG PO TABS: 7.5000 mg | ORAL_TABLET | Freq: Every day | ORAL | 0 refills | Status: DC

## 2020-06-30 MED ORDER — QUETIAPINE FUMARATE 100 MG PO TABS: 100.0000 mg | ORAL_TABLET | Freq: Every day | ORAL | 0 refills | Status: DC

## 2020-06-30 MED ORDER — DIVALPROEX SODIUM 250 MG PO DR TAB: 250.0000 mg | DELAYED_RELEASE_TABLET | Freq: Every day | ORAL | 0 refills | Status: DC

## 2020-06-30 MED ORDER — DIVALPROEX SODIUM ER 500 MG PO TB24: 1000.0000 mg | ORAL_TABLET | Freq: Every evening | ORAL | 0 refills | Status: DC

## 2020-06-30 NOTE — Patient Instructions (Signed)
1. Continue Paxil 40 mg daily  2. Continue mirtazapine 7.5 mg at night 3. Continue Depakote 250 mg in AM, 1000mg  at night 4. Continue quetiapine 100 mg at night 5. Next appointment: 12/20 at 10 AM

## 2020-07-02 ENCOUNTER — Other Ambulatory Visit: Payer: Self-pay

## 2020-07-02 ENCOUNTER — Telehealth (HOSPITAL_COMMUNITY): Payer: Self-pay | Admitting: Clinical

## 2020-07-02 ENCOUNTER — Ambulatory Visit (HOSPITAL_COMMUNITY): Payer: Medicare HMO | Admitting: Clinical

## 2020-07-02 NOTE — Telephone Encounter (Signed)
The OPT therapist called and left a VM, however, the patient failed to return the call missing their scheduled appointment.

## 2020-07-18 DIAGNOSIS — I10 Essential (primary) hypertension: Secondary | ICD-10-CM | POA: Diagnosis not present

## 2020-07-18 DIAGNOSIS — J449 Chronic obstructive pulmonary disease, unspecified: Secondary | ICD-10-CM | POA: Diagnosis not present

## 2020-08-14 DIAGNOSIS — R69 Illness, unspecified: Secondary | ICD-10-CM | POA: Diagnosis not present

## 2020-08-15 LAB — LIPID PANEL
Cholesterol: 137 mg/dL (ref <200)
HDL: 37 mg/dL — ABNORMAL LOW (ref >=50)
LDL Cholesterol (Calc): 81 mg/dL (calc)
Non-HDL Cholesterol (Calc): 100 mg/dL (calc) (ref <130)
Total CHOL/HDL Ratio: 3.7 (calc) (ref <5.0)
Triglycerides: 98 mg/dL (ref <150)

## 2020-08-15 LAB — VALPROIC ACID LEVEL: Valproic Acid Lvl: <12.5 mg/L — ABNORMAL LOW (ref 50.0–100.0)

## 2020-08-17 DIAGNOSIS — E1165 Type 2 diabetes mellitus with hyperglycemia: Secondary | ICD-10-CM | POA: Diagnosis not present

## 2020-08-17 DIAGNOSIS — I10 Essential (primary) hypertension: Secondary | ICD-10-CM | POA: Diagnosis not present

## 2020-08-18 ENCOUNTER — Encounter (HOSPITAL_COMMUNITY): Payer: Self-pay | Admitting: Psychiatry

## 2020-08-22 ENCOUNTER — Ambulatory Visit: Payer: Medicare HMO | Admitting: Gastroenterology

## 2020-09-22 DIAGNOSIS — Z23 Encounter for immunization: Secondary | ICD-10-CM | POA: Diagnosis not present

## 2020-09-22 DIAGNOSIS — R69 Illness, unspecified: Secondary | ICD-10-CM | POA: Diagnosis not present

## 2020-09-22 DIAGNOSIS — E1165 Type 2 diabetes mellitus with hyperglycemia: Secondary | ICD-10-CM | POA: Diagnosis not present

## 2020-09-22 DIAGNOSIS — L259 Unspecified contact dermatitis, unspecified cause: Secondary | ICD-10-CM | POA: Diagnosis not present

## 2020-10-14 ENCOUNTER — Ambulatory Visit: Payer: Medicare HMO | Admitting: Gastroenterology
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
## 2020-10-22 DIAGNOSIS — E1165 Type 2 diabetes mellitus with hyperglycemia: Secondary | ICD-10-CM | POA: Diagnosis not present

## 2020-10-22 DIAGNOSIS — I10 Essential (primary) hypertension: Secondary | ICD-10-CM | POA: Diagnosis not present

## 2020-10-27 ENCOUNTER — Encounter: Payer: Self-pay | Admitting: Psychiatry

## 2020-10-27 ENCOUNTER — Other Ambulatory Visit: Payer: Self-pay

## 2020-10-27 ENCOUNTER — Telehealth (HOSPITAL_COMMUNITY): Payer: Medicare HMO | Admitting: Psychiatry

## 2020-10-27 ENCOUNTER — Telehealth (INDEPENDENT_AMBULATORY_CARE_PROVIDER_SITE_OTHER): Payer: Medicare HMO | Admitting: Psychiatry

## 2020-10-27 DIAGNOSIS — F3181 Bipolar II disorder: Secondary | ICD-10-CM

## 2020-10-27 DIAGNOSIS — F4310 Post-traumatic stress disorder, unspecified: Secondary | ICD-10-CM | POA: Diagnosis not present

## 2020-10-27 DIAGNOSIS — R69 Illness, unspecified: Secondary | ICD-10-CM | POA: Diagnosis not present

## 2020-10-27 MED ORDER — DIVALPROEX SODIUM ER 500 MG PO TB24: 1000.0000 mg | ORAL_TABLET | Freq: Every evening | ORAL | 0 refills | Status: DC

## 2020-10-27 MED ORDER — PAROXETINE HCL 40 MG PO TABS: 40.0000 mg | ORAL_TABLET | Freq: Every day | ORAL | 0 refills | Status: DC

## 2020-10-27 MED ORDER — QUETIAPINE FUMARATE 100 MG PO TABS: 100.0000 mg | ORAL_TABLET | Freq: Every day | ORAL | 0 refills | Status: DC

## 2020-10-27 MED ORDER — DIVALPROEX SODIUM 250 MG PO DR TAB: 250.0000 mg | DELAYED_RELEASE_TABLET | Freq: Every day | ORAL | 0 refills | Status: DC

## 2020-10-27 MED ORDER — MIRTAZAPINE 7.5 MG PO TABS: 7.5000 mg | ORAL_TABLET | Freq: Every day | ORAL | 0 refills | Status: DC

## 2020-12-01 ENCOUNTER — Inpatient Hospital Stay (HOSPITAL_COMMUNITY)
Admission: EM | Admit: 2020-12-01 | Discharge: 2020-12-03 | DRG: 177 | Disposition: A | Discharge status: Home / Self Care | Payer: Medicare HMO | Attending: Internal Medicine | Admitting: Internal Medicine

## 2020-12-01 ENCOUNTER — Emergency Department (HOSPITAL_COMMUNITY): Payer: Medicare HMO

## 2020-12-01 ENCOUNTER — Other Ambulatory Visit: Payer: Self-pay

## 2020-12-01 ENCOUNTER — Inpatient Hospital Stay (HOSPITAL_COMMUNITY): Payer: Medicare HMO

## 2020-12-01 ENCOUNTER — Encounter (HOSPITAL_COMMUNITY): Payer: Self-pay | Admitting: Emergency Medicine

## 2020-12-01 DIAGNOSIS — E662 Morbid (severe) obesity with alveolar hypoventilation: Secondary | ICD-10-CM | POA: Diagnosis present

## 2020-12-01 DIAGNOSIS — R0602 Shortness of breath: Secondary | ICD-10-CM

## 2020-12-01 DIAGNOSIS — J9601 Acute respiratory failure with hypoxia: Secondary | ICD-10-CM | POA: Diagnosis present

## 2020-12-01 DIAGNOSIS — Z818 Family history of other mental and behavioral disorders: Secondary | ICD-10-CM

## 2020-12-01 DIAGNOSIS — U071 COVID-19: Secondary | ICD-10-CM | POA: Diagnosis not present

## 2020-12-01 DIAGNOSIS — R739 Hyperglycemia, unspecified: Secondary | ICD-10-CM | POA: Diagnosis not present

## 2020-12-01 DIAGNOSIS — I129 Hypertensive chronic kidney disease with stage 1 through stage 4 chronic kidney disease, or unspecified chronic kidney disease: Secondary | ICD-10-CM | POA: Diagnosis present

## 2020-12-01 DIAGNOSIS — Z20822 Contact with and (suspected) exposure to covid-19: Secondary | ICD-10-CM

## 2020-12-01 DIAGNOSIS — Z743 Need for continuous supervision: Secondary | ICD-10-CM | POA: Diagnosis not present

## 2020-12-01 DIAGNOSIS — F4310 Post-traumatic stress disorder, unspecified: Secondary | ICD-10-CM | POA: Diagnosis present

## 2020-12-01 DIAGNOSIS — J1282 Pneumonia due to coronavirus disease 2019: Secondary | ICD-10-CM | POA: Diagnosis present

## 2020-12-01 DIAGNOSIS — Z79890 Hormone replacement therapy (postmenopausal): Secondary | ICD-10-CM

## 2020-12-01 DIAGNOSIS — F17210 Nicotine dependence, cigarettes, uncomplicated: Secondary | ICD-10-CM | POA: Diagnosis present

## 2020-12-01 DIAGNOSIS — Z9851 Tubal ligation status: Secondary | ICD-10-CM | POA: Diagnosis not present

## 2020-12-01 DIAGNOSIS — N179 Acute kidney failure, unspecified: Secondary | ICD-10-CM | POA: Diagnosis present

## 2020-12-01 DIAGNOSIS — E039 Hypothyroidism, unspecified: Secondary | ICD-10-CM | POA: Diagnosis present

## 2020-12-01 DIAGNOSIS — I517 Cardiomegaly: Secondary | ICD-10-CM | POA: Diagnosis not present

## 2020-12-01 DIAGNOSIS — Z79899 Other long term (current) drug therapy: Secondary | ICD-10-CM | POA: Diagnosis not present

## 2020-12-01 DIAGNOSIS — F319 Bipolar disorder, unspecified: Secondary | ICD-10-CM | POA: Diagnosis present

## 2020-12-01 DIAGNOSIS — K219 Gastro-esophageal reflux disease without esophagitis: Secondary | ICD-10-CM | POA: Diagnosis present

## 2020-12-01 DIAGNOSIS — K589 Irritable bowel syndrome without diarrhea: Secondary | ICD-10-CM | POA: Diagnosis present

## 2020-12-01 DIAGNOSIS — J189 Pneumonia, unspecified organism: Secondary | ICD-10-CM | POA: Diagnosis not present

## 2020-12-01 DIAGNOSIS — E782 Mixed hyperlipidemia: Secondary | ICD-10-CM | POA: Diagnosis present

## 2020-12-01 DIAGNOSIS — E1165 Type 2 diabetes mellitus with hyperglycemia: Secondary | ICD-10-CM | POA: Diagnosis present

## 2020-12-01 DIAGNOSIS — E1140 Type 2 diabetes mellitus with diabetic neuropathy, unspecified: Secondary | ICD-10-CM | POA: Diagnosis present

## 2020-12-01 DIAGNOSIS — N1832 Chronic kidney disease, stage 3b: Secondary | ICD-10-CM | POA: Diagnosis present

## 2020-12-01 DIAGNOSIS — E1122 Type 2 diabetes mellitus with diabetic chronic kidney disease: Secondary | ICD-10-CM | POA: Diagnosis present

## 2020-12-01 DIAGNOSIS — Z86010 Personal history of colonic polyps: Secondary | ICD-10-CM

## 2020-12-01 DIAGNOSIS — R0902 Hypoxemia: Secondary | ICD-10-CM

## 2020-12-01 DIAGNOSIS — J441 Chronic obstructive pulmonary disease with (acute) exacerbation: Secondary | ICD-10-CM | POA: Diagnosis present

## 2020-12-01 DIAGNOSIS — I700 Atherosclerosis of aorta: Secondary | ICD-10-CM | POA: Diagnosis not present

## 2020-12-01 DIAGNOSIS — Z794 Long term (current) use of insulin: Secondary | ICD-10-CM

## 2020-12-01 DIAGNOSIS — Z720 Tobacco use: Secondary | ICD-10-CM | POA: Diagnosis not present

## 2020-12-01 DIAGNOSIS — J440 Chronic obstructive pulmonary disease with acute lower respiratory infection: Secondary | ICD-10-CM | POA: Diagnosis present

## 2020-12-01 DIAGNOSIS — Z6839 Body mass index (BMI) 39.0-39.9, adult: Secondary | ICD-10-CM

## 2020-12-01 DIAGNOSIS — J984 Other disorders of lung: Secondary | ICD-10-CM | POA: Diagnosis not present

## 2020-12-01 DIAGNOSIS — R069 Unspecified abnormalities of breathing: Secondary | ICD-10-CM | POA: Diagnosis not present

## 2020-12-01 DIAGNOSIS — Z9049 Acquired absence of other specified parts of digestive tract: Secondary | ICD-10-CM

## 2020-12-01 DIAGNOSIS — I2510 Atherosclerotic heart disease of native coronary artery without angina pectoris: Secondary | ICD-10-CM | POA: Diagnosis not present

## 2020-12-01 DIAGNOSIS — R918 Other nonspecific abnormal finding of lung field: Secondary | ICD-10-CM | POA: Diagnosis not present

## 2020-12-01 LAB — CBG MONITORING, ED
Glucose-Capillary: 413 mg/dL — ABNORMAL HIGH (ref 70–99)
Glucose-Capillary: 147 mg/dL — ABNORMAL HIGH (ref 70–99)
Glucose-Capillary: 274 mg/dL — ABNORMAL HIGH (ref 70–99)
Glucose-Capillary: 223 mg/dL — ABNORMAL HIGH (ref 70–99)

## 2020-12-01 LAB — CBC WITH DIFFERENTIAL/PLATELET
Abs Immature Granulocytes: 0.61 10*3/uL — ABNORMAL HIGH (ref 0.00–0.07)
Basophils Absolute: 0.1 10*3/uL (ref 0.0–0.1)
Basophils Relative: 1 %
Eosinophils Absolute: 0 10*3/uL (ref 0.0–0.5)
Eosinophils Relative: 0 %
HCT: 42 % (ref 36.0–46.0)
Hemoglobin: 13.8 g/dL (ref 12.0–15.0)
Immature Granulocytes: 6 %
Lymphocytes Relative: 8 %
Lymphs Abs: 0.7 10*3/uL (ref 0.7–4.0)
MCH: 30.2 pg (ref 26.0–34.0)
MCHC: 32.9 g/dL (ref 30.0–36.0)
MCV: 91.9 fL (ref 80.0–100.0)
Monocytes Absolute: 0.5 10*3/uL (ref 0.1–1.0)
Monocytes Relative: 5 %
Neutro Abs: 7.8 10*3/uL — ABNORMAL HIGH (ref 1.7–7.7)
Neutrophils Relative %: 80 %
Platelets: 228 10*3/uL (ref 150–400)
RBC: 4.57 MIL/uL (ref 3.87–5.11)
RDW: 13.9 % (ref 11.5–15.5)
WBC: 9.7 10*3/uL (ref 4.0–10.5)
nRBC: 0 % (ref 0.0–0.2)

## 2020-12-01 LAB — CREATININE, SERUM
Creatinine, Ser: 1.35 mg/dL — ABNORMAL HIGH (ref 0.44–1.00)
GFR, Estimated: 43 mL/min — ABNORMAL LOW (ref >60)

## 2020-12-01 LAB — COMPREHENSIVE METABOLIC PANEL
ALT: 12 U/L (ref 0–44)
AST: 16 U/L (ref 15–41)
Albumin: 3 g/dL — ABNORMAL LOW (ref 3.5–5.0)
Alkaline Phosphatase: 80 U/L (ref 38–126)
Anion gap: 14 (ref 5–15)
BUN: 35 mg/dL — ABNORMAL HIGH (ref 8–23)
CO2: 25 mmol/L (ref 22–32)
Calcium: 10.1 mg/dL (ref 8.9–10.3)
Chloride: 96 mmol/L — ABNORMAL LOW (ref 98–111)
Creatinine, Ser: 1.64 mg/dL — ABNORMAL HIGH (ref 0.44–1.00)
GFR, Estimated: 34 mL/min — ABNORMAL LOW (ref >60)
Glucose, Bld: 399 mg/dL — ABNORMAL HIGH (ref 70–99)
Potassium: 4.5 mmol/L (ref 3.5–5.1)
Sodium: 135 mmol/L (ref 135–145)
Total Bilirubin: 1.1 mg/dL (ref 0.3–1.2)
Total Protein: 6.9 g/dL (ref 6.5–8.1)

## 2020-12-01 LAB — HEMOGLOBIN A1C
Hgb A1c MFr Bld: 10.2 % — ABNORMAL HIGH (ref 4.8–5.6)
Mean Plasma Glucose: 246.04 mg/dL

## 2020-12-01 LAB — CBC
HCT: 43.9 % (ref 36.0–46.0)
Hemoglobin: 14.1 g/dL (ref 12.0–15.0)
MCH: 30.3 pg (ref 26.0–34.0)
MCHC: 32.1 g/dL (ref 30.0–36.0)
MCV: 94.2 fL (ref 80.0–100.0)
Platelets: 227 10*3/uL (ref 150–400)
RBC: 4.66 MIL/uL (ref 3.87–5.11)
RDW: 14.1 % (ref 11.5–15.5)
WBC: 9.3 10*3/uL (ref 4.0–10.5)
nRBC: 0 % (ref 0.0–0.2)

## 2020-12-01 LAB — D-DIMER, QUANTITATIVE: D-Dimer, Quant: 10.27 ug/mL-FEU — ABNORMAL HIGH (ref 0.00–0.50)

## 2020-12-01 LAB — FIBRINOGEN: Fibrinogen: 696 mg/dL — ABNORMAL HIGH (ref 210–475)

## 2020-12-01 LAB — FERRITIN: Ferritin: 203 ng/mL (ref 11–307)

## 2020-12-01 LAB — SARS CORONAVIRUS 2 (TAT 6-24 HRS): SARS Coronavirus 2: POSITIVE — AB

## 2020-12-01 LAB — PROCALCITONIN: Procalcitonin: <0.1 ng/mL

## 2020-12-01 LAB — C-REACTIVE PROTEIN: CRP: 19 mg/dL — ABNORMAL HIGH (ref <1.0)

## 2020-12-01 LAB — TROPONIN I (HIGH SENSITIVITY)
Troponin I (High Sensitivity): 15 ng/L (ref <18)
Troponin I (High Sensitivity): 18 ng/L — ABNORMAL HIGH (ref <18)

## 2020-12-01 LAB — BRAIN NATRIURETIC PEPTIDE: B Natriuretic Peptide: 174 pg/mL — ABNORMAL HIGH (ref 0.0–100.0)

## 2020-12-01 LAB — LACTATE DEHYDROGENASE
LDH: 207 U/L — ABNORMAL HIGH (ref 98–192)
LDH: 230 U/L — ABNORMAL HIGH (ref 98–192)

## 2020-12-01 IMAGING — DX DG CHEST 1V PORT
1 series · 1 of 1 positions shown · non-contrast
Comparison: [DATE]

CLINICAL DATA: Shortness of breath and hypoxia

EXAM:
PORTABLE CHEST 1 VIEW

[chest ap]
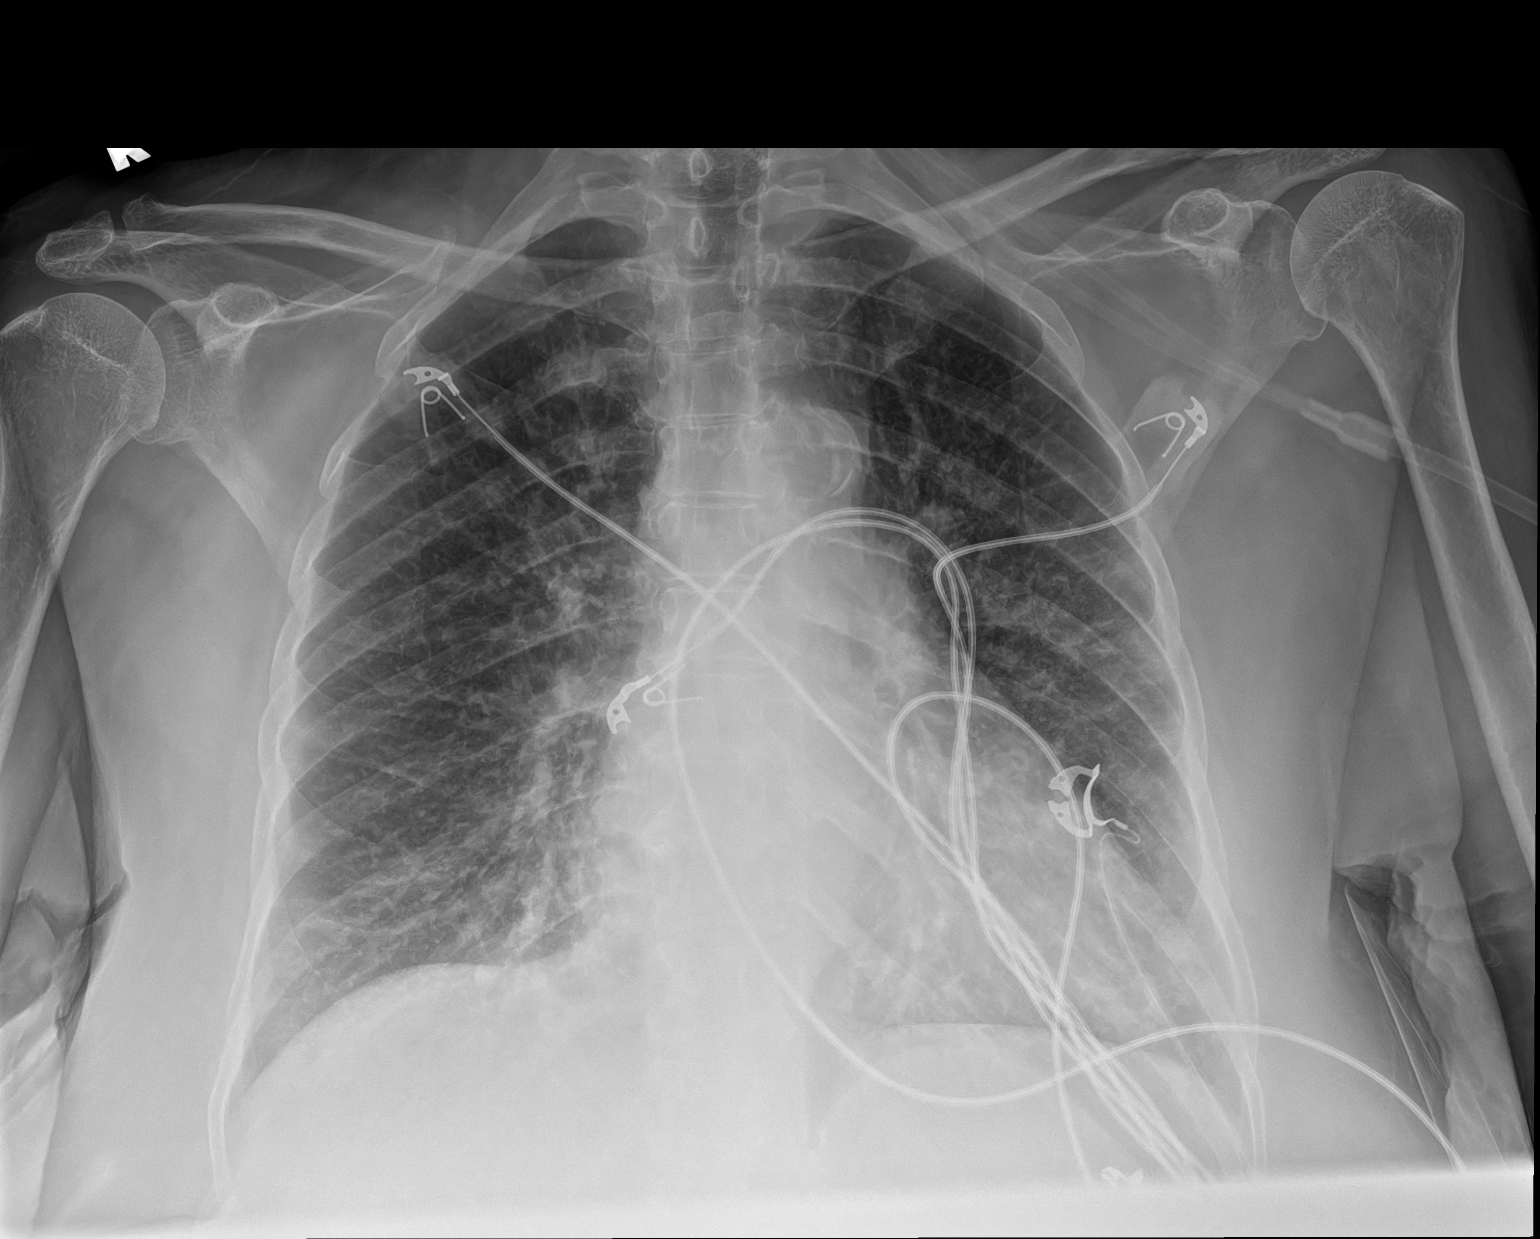

[1 of 1 positions shown; findings below may reference images not displayed]

FINDINGS: Borderline cardiomegaly likely accentuated by mediastinal fat.
Interstitial coarsening and hazy density at the bases. No effusion,
consolidation, or pneumothorax. Artifact from EKG leads.
IMPRESSION: Hazy density in the lower lungs which could be atypical infection or
vascular congestion.

## 2020-12-01 IMAGING — CT CT CHEST W/O CM
2 of 4 series · 15 of 36 positions shown, 18 images · non-contrast
Comparison: Chest radiograph [DATE]

CLINICAL DATA: Decreased oxygen saturation

EXAM:
CT CHEST WITHOUT CONTRAST
TECHNIQUE: Multidetector CT imaging of the chest was performed following the
standard protocol without IV contrast.

[Series 2: routine chest without · axial · non-contrast · 0.55mm/px · z∈[+1014,+1278]mm · 12 of 156 slices shown, 15 images]
[im 12/156  mediastinal]
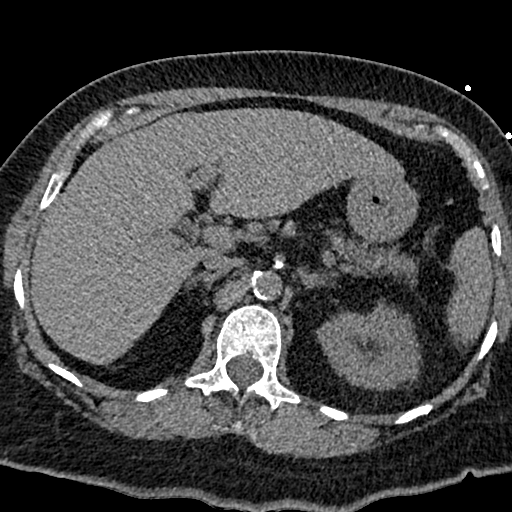
[im 12/156  lung]
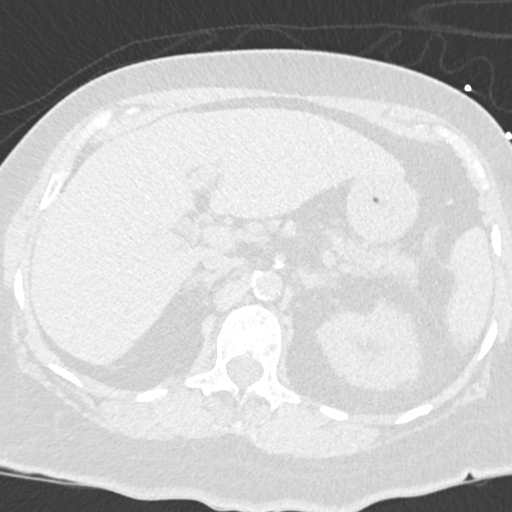
[im 24/156  lung]
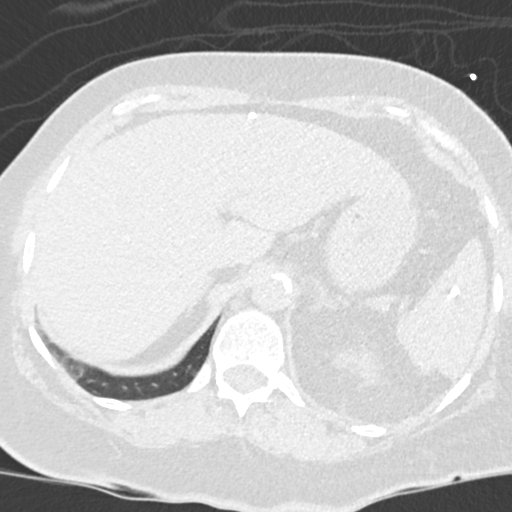
[im 36/156  lung]
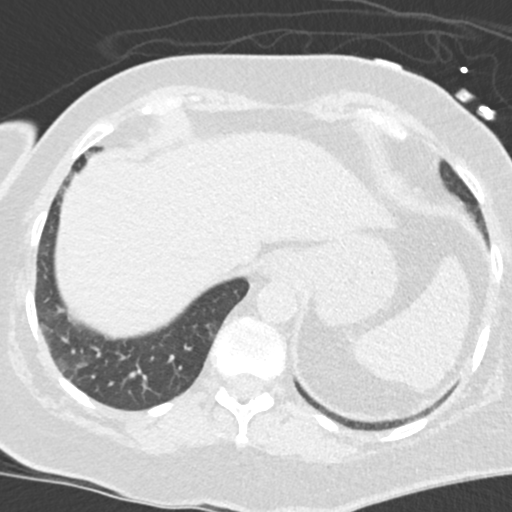
[im 48/156  lung]
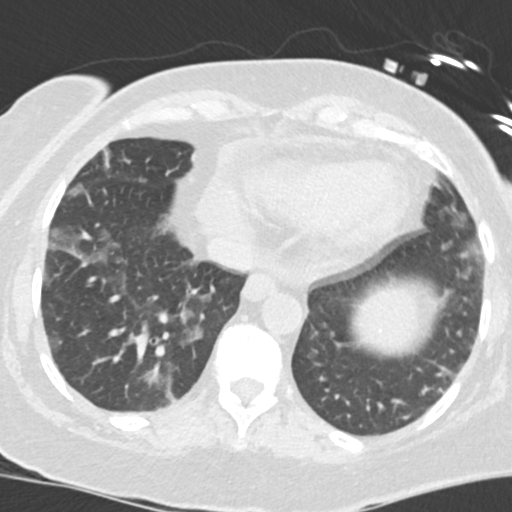
[im 60/156  mediastinal]
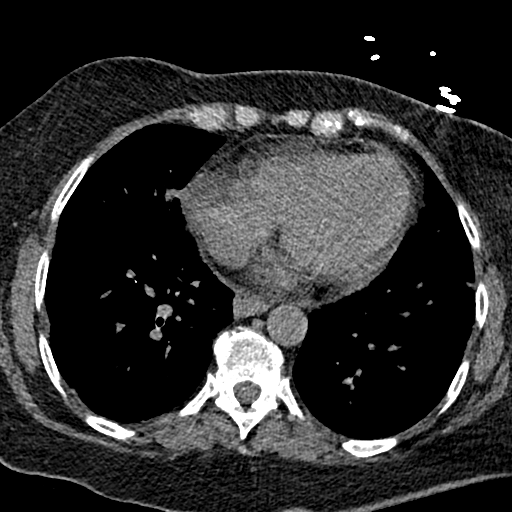
[im 60/156  lung]
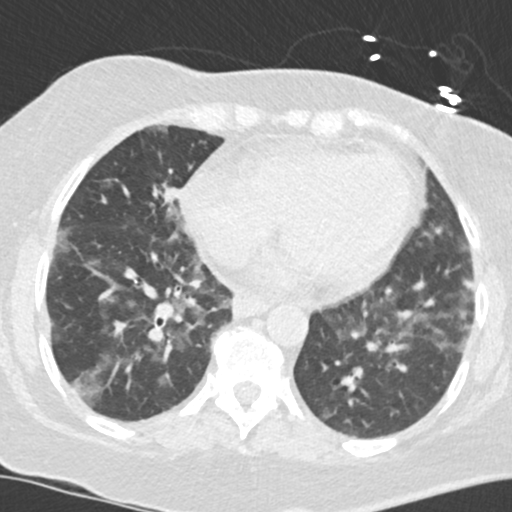
[im 72/156  lung]
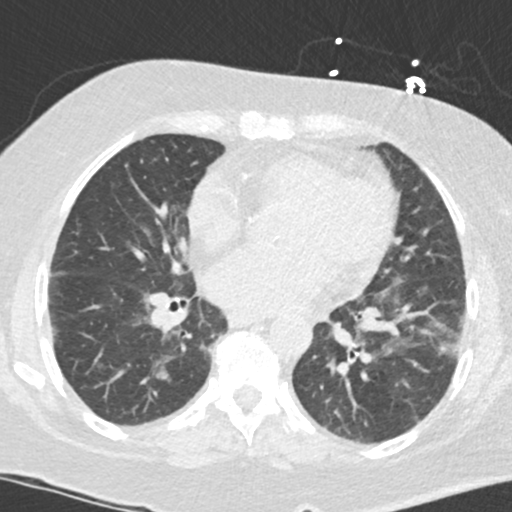
[im 84/156  lung]
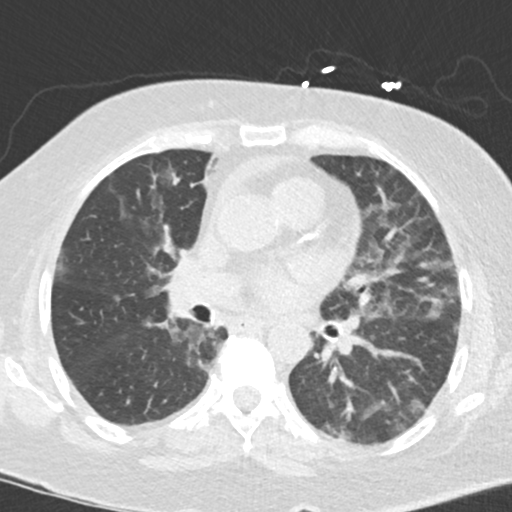
[im 96/156  lung]
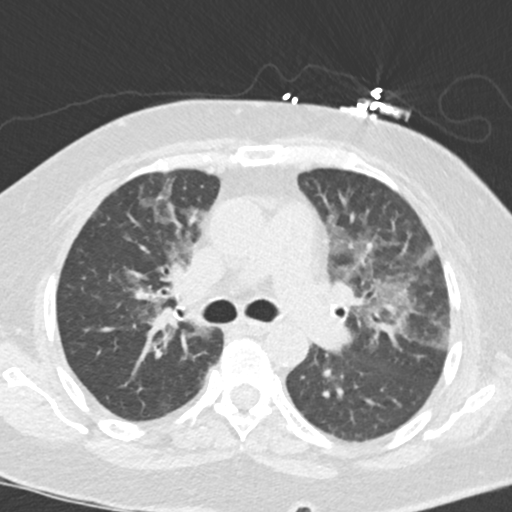
[im 108/156  mediastinal]
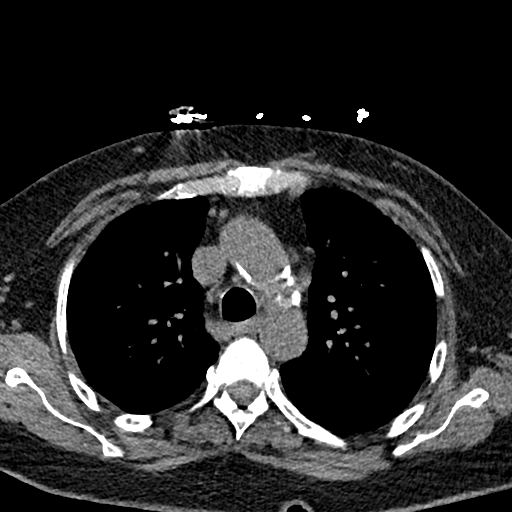
[im 108/156  lung]
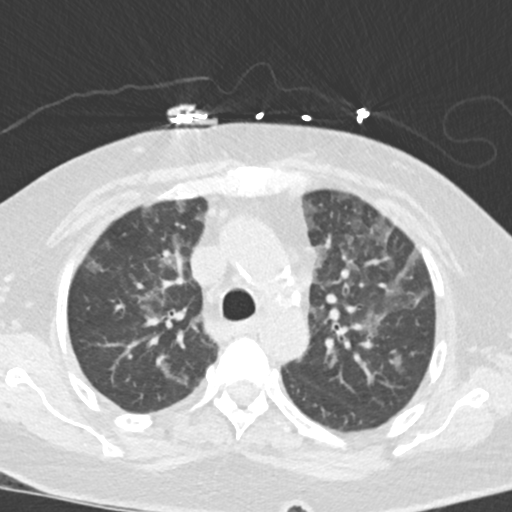
[im 120/156  lung]
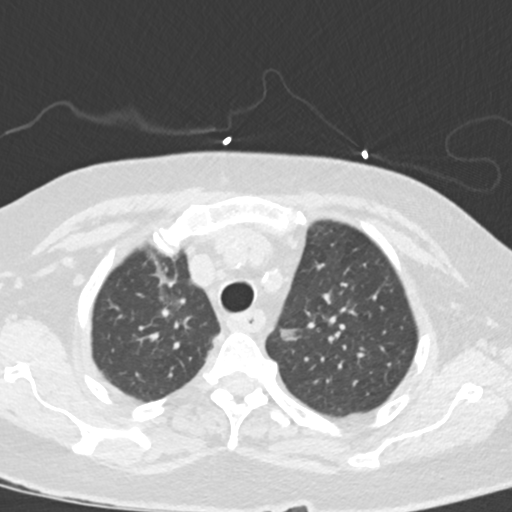
[im 132/156  lung]
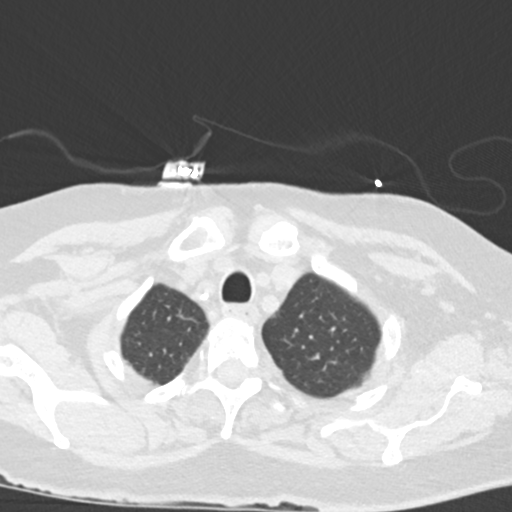
[im 144/156  lung]
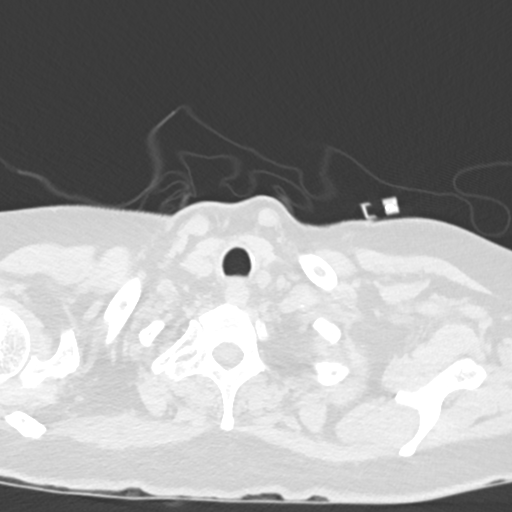

[Series 5: coronal · coronal · 0.62mm/px · 3 of 128 slices shown]
[im 26/128  lung]
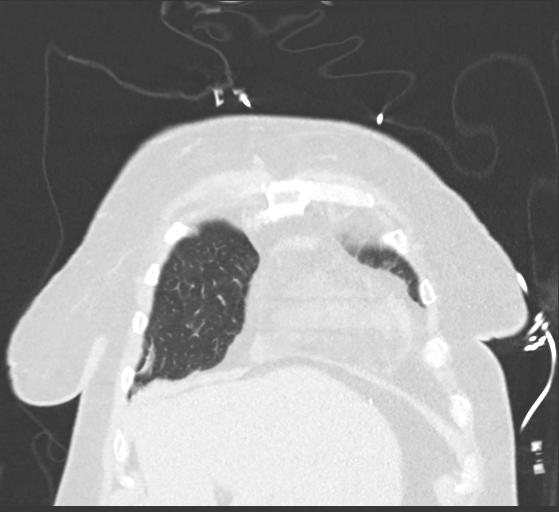
[im 51/128  lung]
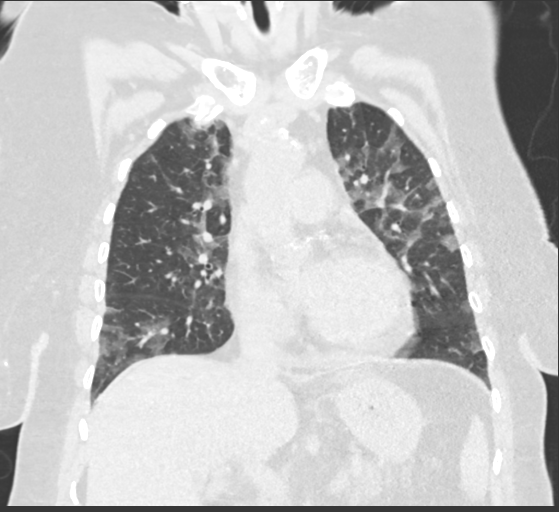
[im 77/128  lung]
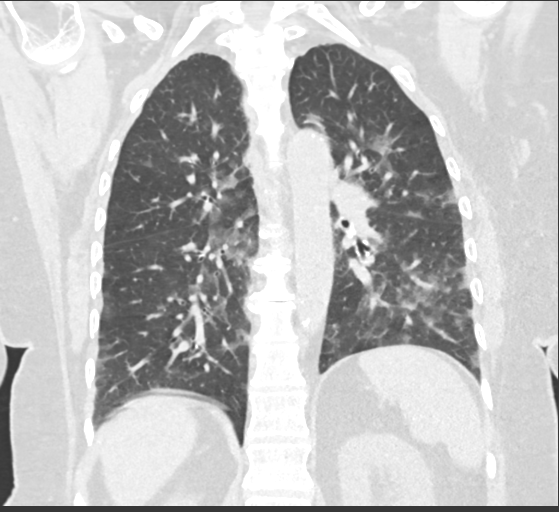

[15 of 36 positions shown; findings below may reference images not displayed]

FINDINGS: Cardiovascular: There is no thoracic aortic aneurysm. There are foci
of great vessel calcification. There are foci aortic atherosclerosis
as well as multiple foci of coronary artery calcification. A small
amount of pleural fluid is within physiologic range. No pericardial
thickening evident.

Mediastinum/Nodes: Thyroid is diminutive. No thyroid lesions are
evident. There is an aortopulmonary window lymph node measuring
x 1.0 cm. There is a subcarinal lymph node measuring 1.3 x 1.2 cm.
There are scattered subcentimeter mediastinal and axillary lymph
nodes elsewhere. There is no appreciable esophageal lesion.

Lungs/Pleura: There is scarring in the apices. There are scattered
areas of ill-defined airspace opacity throughout the lungs
bilaterally without consolidation. No pleural effusion. No
pneumothorax. Trachea and major bronchial structures appear patent.

Upper Abdomen: There is upper abdominal aortic atherosclerosis.
Gallbladder is absent. Visualized upper abdominal structures
otherwise appear unremarkable.

Musculoskeletal: No blastic or lytic bone lesions. No evident chest
wall lesions.
IMPRESSION: 1. Multifocal airspace opacity throughout the lungs without
consolidation. This appearance is felt to be indicative of atypical
organism pneumonia. Correlation with [7A] status advised.

2. Mildly prominent aortopulmonary window and subcarinal lymph nodes
likely are of reactive etiology given the underlying parenchymal
lung changes.

3. Aortic atherosclerosis. Foci of great vessel and coronary artery
calcification noted.

4.  Absent gallbladder.

Aortic Atherosclerosis ([7A]-[7A]).

## 2020-12-01 IMAGING — NM NM PULMONARY PERF PARTICULATE
8 series · 8 of 8 positions shown · non-contrast
Comparison: CT chest [DATE].  Chest radiograph [DATE].

CLINICAL DATA: Shortness of breath for months. Pulmonary embolus
suspected with high probability.

EXAM:
NUCLEAR MEDICINE PERFUSION LUNG SCAN
TECHNIQUE: Perfusion images were obtained in multiple projections after
intravenous injection of radiopharmaceutical.
Ventilation scans intentionally deferred if perfusion scan and chest
x-ray adequate for interpretation during COVID 19 epidemic.
RADIOPHARMACEUTICALS:  4.3 mCi [YY] MAA IV

[Series 1: ant/post perf · 4.14mm/px · 1 of 1 slices shown (1 of 2)]
[im 1/1]
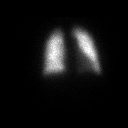

[Series 1: ant/post perf · 4.14mm/px · 1 of 1 slices shown (2 of 2)]
[im 1/1]
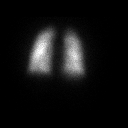

[Series 2: lao/rpo perf · 4.14mm/px · 1 of 1 slices shown (1 of 2)]
[im 1/1]
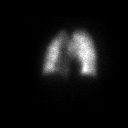

[Series 2: lao/rpo perf · 4.14mm/px · 1 of 1 slices shown (2 of 2)]
[im 1/1]
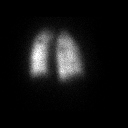

[Series 3: lt lat/rt lat perf · 4.14mm/px · 1 of 1 slices shown (1 of 2)]
[im 1/1]
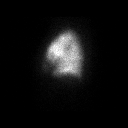

[Series 3: lt lat/rt lat perf · 4.14mm/px · 1 of 1 slices shown (2 of 2)]
[im 1/1]
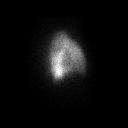

[Series 4: lpo/rao perf · 4.14mm/px · 1 of 1 slices shown (1 of 2)]
[im 1/1]
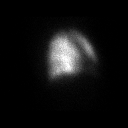

[Series 4: lpo/rao perf · 4.14mm/px · 1 of 1 slices shown (2 of 2)]
[im 1/1]
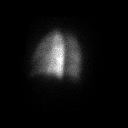

[8 of 8 positions shown; findings below may reference images not displayed]

FINDINGS: Perfusion studies obtained without corresponding ventilation study.
Perfusion study demonstrates normal homogeneous uptake of activity
throughout both lungs. No focal perfusion defects are identified.
Appearance suggest very low probability of pulmonary embolus.
IMPRESSION: Normal homogeneous perfusion of both lungs.

## 2020-12-01 MED ORDER — INSULIN ASPART 100 UNIT/ML ~~LOC~~ SOLN
0.0000 [IU] | Freq: Three times a day (TID) | SUBCUTANEOUS | Status: DC
  Administered 2020-12-01: 8 [IU] via SUBCUTANEOUS
  Administered 2020-12-02: 5 [IU] via SUBCUTANEOUS
  Administered 2020-12-02 (×2): 3 [IU] via SUBCUTANEOUS
  Filled 2020-12-01 (×4): qty 1

## 2020-12-01 MED ORDER — SODIUM CHLORIDE 0.9 % IV SOLN
100.0000 mg | Freq: Every day | INTRAVENOUS | Status: DC
  Administered 2020-12-02 – 2020-12-03 (×2): 100 mg via INTRAVENOUS
  Filled 2020-12-01: qty 20

## 2020-12-01 MED ORDER — SODIUM CHLORIDE 0.9% FLUSH
3.0000 mL | Freq: Two times a day (BID) | INTRAVENOUS | Status: DC
  Administered 2020-12-01 – 2020-12-02 (×4): 3 mL via INTRAVENOUS

## 2020-12-01 MED ORDER — TECHNETIUM TO 99M ALBUMIN AGGREGATED
4.3000 | Freq: Once | INTRAVENOUS | Status: AC | PRN
  Administered 2020-12-01: 4.3 via INTRAVENOUS

## 2020-12-01 MED ORDER — SODIUM CHLORIDE 0.9 % IV SOLN: INTRAVENOUS | Status: DC

## 2020-12-01 MED ORDER — INSULIN DETEMIR 100 UNIT/ML ~~LOC~~ SOLN
0.2000 [IU]/kg | Freq: Two times a day (BID) | SUBCUTANEOUS | Status: DC
  Administered 2020-12-01 – 2020-12-02 (×3): 21 [IU] via SUBCUTANEOUS
  Filled 2020-12-01 (×6): qty 0.21

## 2020-12-01 MED ORDER — GABAPENTIN 100 MG PO CAPS
100.0000 mg | ORAL_CAPSULE | Freq: Three times a day (TID) | ORAL | Status: DC
  Administered 2020-12-01 – 2020-12-03 (×5): 100 mg via ORAL
  Filled 2020-12-01 (×5): qty 1

## 2020-12-01 MED ORDER — FUROSEMIDE 10 MG/ML IJ SOLN
20.0000 mg | Freq: Once | INTRAMUSCULAR | Status: AC
  Administered 2020-12-01: 20 mg via INTRAVENOUS
  Filled 2020-12-01: qty 2

## 2020-12-01 MED ORDER — AMLODIPINE BESYLATE 5 MG PO TABS
5.0000 mg | ORAL_TABLET | Freq: Every day | ORAL | Status: DC
  Administered 2020-12-01 – 2020-12-03 (×3): 5 mg via ORAL
  Filled 2020-12-01 (×3): qty 1

## 2020-12-01 MED ORDER — PANTOPRAZOLE SODIUM 40 MG PO TBEC
40.0000 mg | DELAYED_RELEASE_TABLET | Freq: Every day | ORAL | Status: DC
  Administered 2020-12-01 – 2020-12-03 (×3): 40 mg via ORAL
  Filled 2020-12-01 (×3): qty 1

## 2020-12-01 MED ORDER — INSULIN DETEMIR 100 UNIT/ML ~~LOC~~ SOLN
0.0750 [IU]/kg | Freq: Two times a day (BID) | SUBCUTANEOUS | Status: DC
  Administered 2020-12-01: 8 [IU] via SUBCUTANEOUS
  Filled 2020-12-01 (×3): qty 0.08

## 2020-12-01 MED ORDER — ONDANSETRON HCL 4 MG/2ML IJ SOLN
4.0000 mg | Freq: Four times a day (QID) | INTRAMUSCULAR | Status: DC | PRN
  Administered 2020-12-01: 4 mg via INTRAVENOUS
  Filled 2020-12-01: qty 2

## 2020-12-01 MED ORDER — ASCORBIC ACID 500 MG PO TABS
500.0000 mg | ORAL_TABLET | Freq: Every day | ORAL | Status: DC
  Administered 2020-12-01 – 2020-12-03 (×3): 500 mg via ORAL
  Filled 2020-12-01 (×4): qty 1

## 2020-12-01 MED ORDER — DIVALPROEX SODIUM ER 500 MG PO TB24
1000.0000 mg | ORAL_TABLET | Freq: Every evening | ORAL | Status: DC
  Administered 2020-12-01 – 2020-12-02 (×2): 1000 mg via ORAL
  Filled 2020-12-01 (×2): qty 2

## 2020-12-01 MED ORDER — SIMVASTATIN 20 MG PO TABS
40.0000 mg | ORAL_TABLET | Freq: Every day | ORAL | Status: DC
  Administered 2020-12-01 – 2020-12-02 (×2): 40 mg via ORAL
  Filled 2020-12-01 (×2): qty 4

## 2020-12-01 MED ORDER — INSULIN ASPART 100 UNIT/ML ~~LOC~~ SOLN
10.0000 [IU] | Freq: Once | SUBCUTANEOUS | Status: AC
  Administered 2020-12-01: 10 [IU] via INTRAVENOUS
  Filled 2020-12-01: qty 1

## 2020-12-01 MED ORDER — HYDROCOD POLST-CPM POLST ER 10-8 MG/5ML PO SUER: 5.0000 mL | Freq: Two times a day (BID) | ORAL | Status: DC | PRN

## 2020-12-01 MED ORDER — SODIUM CHLORIDE 0.9 % IV BOLUS
500.0000 mL | Freq: Once | INTRAVENOUS | Status: AC
  Administered 2020-12-01: 500 mL via INTRAVENOUS

## 2020-12-01 MED ORDER — SODIUM CHLORIDE 0.9 % IV SOLN: 250.0000 mL | INTRAVENOUS | Status: DC | PRN

## 2020-12-01 MED ORDER — METHYLPREDNISOLONE SODIUM SUCC 125 MG IJ SOLR
0.5000 mg/kg | Freq: Two times a day (BID) | INTRAMUSCULAR | Status: DC
  Administered 2020-12-01 – 2020-12-03 (×4): 51.875 mg via INTRAVENOUS
  Filled 2020-12-01 (×4): qty 2

## 2020-12-01 MED ORDER — LINAGLIPTIN 5 MG PO TABS
5.0000 mg | ORAL_TABLET | Freq: Every day | ORAL | Status: DC
  Administered 2020-12-01 – 2020-12-03 (×3): 5 mg via ORAL
  Filled 2020-12-01 (×3): qty 1

## 2020-12-01 MED ORDER — MIRTAZAPINE 15 MG PO TABS
7.5000 mg | ORAL_TABLET | Freq: Every day | ORAL | Status: DC
  Administered 2020-12-02: 7.5 mg via ORAL
  Filled 2020-12-01: qty 1

## 2020-12-01 MED ORDER — INSULIN ASPART 100 UNIT/ML ~~LOC~~ SOLN
4.0000 [IU] | Freq: Three times a day (TID) | SUBCUTANEOUS | Status: DC
  Administered 2020-12-01: 4 [IU] via SUBCUTANEOUS

## 2020-12-01 MED ORDER — PREDNISONE 20 MG PO TABS: 50.0000 mg | ORAL_TABLET | Freq: Every day | ORAL | Status: DC

## 2020-12-01 MED ORDER — GUAIFENESIN-DM 100-10 MG/5ML PO SYRP: 10.0000 mL | ORAL_SOLUTION | ORAL | Status: DC | PRN

## 2020-12-01 MED ORDER — INSULIN ASPART 100 UNIT/ML ~~LOC~~ SOLN: 7.0000 [IU] | Freq: Three times a day (TID) | SUBCUTANEOUS | Status: DC

## 2020-12-01 MED ORDER — INSULIN ASPART 100 UNIT/ML ~~LOC~~ SOLN
0.0000 [IU] | Freq: Every day | SUBCUTANEOUS | Status: DC
  Administered 2020-12-01: 2 [IU] via SUBCUTANEOUS
  Filled 2020-12-01 (×2): qty 1

## 2020-12-01 MED ORDER — PAROXETINE HCL 20 MG PO TABS
40.0000 mg | ORAL_TABLET | Freq: Every day | ORAL | Status: DC
  Administered 2020-12-01 – 2020-12-03 (×3): 40 mg via ORAL
  Filled 2020-12-01 (×3): qty 2

## 2020-12-01 MED ORDER — SODIUM CHLORIDE 0.9 % IV SOLN: 200.0000 mg | Freq: Once | INTRAVENOUS | Status: DC

## 2020-12-01 MED ORDER — NICOTINE 21 MG/24HR TD PT24
21.0000 mg | MEDICATED_PATCH | Freq: Every day | TRANSDERMAL | Status: DC
  Administered 2020-12-02 – 2020-12-03 (×2): 21 mg via TRANSDERMAL
  Filled 2020-12-01 (×2): qty 1

## 2020-12-01 MED ORDER — DIVALPROEX SODIUM 250 MG PO DR TAB
250.0000 mg | DELAYED_RELEASE_TABLET | Freq: Every day | ORAL | Status: DC
Start: 2020-12-02 — End: 2020-12-03
  Administered 2020-12-02 – 2020-12-03 (×2): 250 mg via ORAL
  Filled 2020-12-01 (×2): qty 1

## 2020-12-01 MED ORDER — LEVOTHYROXINE SODIUM 100 MCG PO TABS
200.0000 ug | ORAL_TABLET | Freq: Every day | ORAL | Status: DC
  Administered 2020-12-02 – 2020-12-03 (×2): 200 ug via ORAL
  Filled 2020-12-01: qty 4
  Filled 2020-12-01: qty 2

## 2020-12-01 MED ORDER — IPRATROPIUM-ALBUTEROL 20-100 MCG/ACT IN AERS
1.0000 | INHALATION_SPRAY | Freq: Four times a day (QID) | RESPIRATORY_TRACT | Status: DC
  Administered 2020-12-01 – 2020-12-02 (×4): 1 via RESPIRATORY_TRACT
  Filled 2020-12-01: qty 4

## 2020-12-01 MED ORDER — IPRATROPIUM-ALBUTEROL 20-100 MCG/ACT IN AERS: 1.0000 | INHALATION_SPRAY | Freq: Four times a day (QID) | RESPIRATORY_TRACT | Status: DC

## 2020-12-01 MED ORDER — ACETAMINOPHEN 325 MG PO TABS
650.0000 mg | ORAL_TABLET | Freq: Four times a day (QID) | ORAL | Status: DC | PRN
  Administered 2020-12-01 – 2020-12-03 (×2): 650 mg via ORAL
  Filled 2020-12-01 (×2): qty 2

## 2020-12-01 MED ORDER — PANTOPRAZOLE SODIUM 40 MG PO TBEC: 40.0000 mg | DELAYED_RELEASE_TABLET | Freq: Every day | ORAL | Status: DC

## 2020-12-01 MED ORDER — ZINC SULFATE 220 (50 ZN) MG PO CAPS
220.0000 mg | ORAL_CAPSULE | Freq: Every day | ORAL | Status: DC
  Administered 2020-12-01 – 2020-12-03 (×3): 220 mg via ORAL
  Filled 2020-12-01 (×2): qty 1

## 2020-12-01 MED ORDER — HEPARIN SODIUM (PORCINE) 5000 UNIT/ML IJ SOLN
5000.0000 [IU] | Freq: Three times a day (TID) | INTRAMUSCULAR | Status: DC
  Administered 2020-12-01 – 2020-12-03 (×6): 5000 [IU] via SUBCUTANEOUS
  Filled 2020-12-01 (×6): qty 1

## 2020-12-01 MED ORDER — SODIUM CHLORIDE 0.9% FLUSH: 3.0000 mL | INTRAVENOUS | Status: DC | PRN

## 2020-12-01 MED ORDER — SODIUM CHLORIDE 0.9 % IV SOLN
100.0000 mg | INTRAVENOUS | Status: AC
  Administered 2020-12-01 (×2): 100 mg via INTRAVENOUS
  Filled 2020-12-01 (×2): qty 20

## 2020-12-01 MED ORDER — SODIUM CHLORIDE 0.9 % IV SOLN: 100.0000 mg | Freq: Every day | INTRAVENOUS | Status: DC

## 2020-12-01 MED ORDER — ONDANSETRON HCL 4 MG PO TABS: 4.0000 mg | ORAL_TABLET | Freq: Four times a day (QID) | ORAL | Status: DC | PRN

## 2020-12-01 MED ORDER — QUETIAPINE FUMARATE 100 MG PO TABS
100.0000 mg | ORAL_TABLET | Freq: Every day | ORAL | Status: DC
  Administered 2020-12-01 – 2020-12-02 (×2): 100 mg via ORAL
  Filled 2020-12-01 (×2): qty 1

## 2020-12-01 NOTE — H&P (Signed)
History and Physical    Claudia Keller [id number] DOB: 02-05-1951 DOA: 12/01/2020  PCP: Rosita Fire, MD   Patient coming from: Home  I have personally briefly reviewed patient's old medical records in [place]  Chief Complaint: Elevated blood sugars, shortness of breath, general malaise.  HPI: Claudia Keller is a 70 y.o. female with medical history significant of COPD, morbid obesity, hypertension, hypothyroidism, hyperlipidemia, type 2 diabetes mellitus with nephropathy and neuropathy, tobacco abuse and depression/anxiety; presented to the hospital secondary to shortness of breath nonproductive cough, general malaise.  Patient reports symptoms have been present for the last 3 to 4 days and worsening in the last 48 hours prior to admission.  EMS was called and on arrival to her house patient was found hypoglycemic with CBGs 453 and also hypoxic with oxygen saturation of 80% on room air.  Patient denies fever, chills, chest pain, nausea, vomiting, dysuria, hematuria, melena, hematochezia, focal weakness or sick contacts.  Patient reported going out on her home inhalers and felt that she couldn't make it any longer without assistance.   Of note, patient is not vaccinated against Covid.  ED Course: Reporting shortness of breath, dyspnea on exertion, nonproductive coughing spells and general malaise.  Chest images demonstrating vascular congestion and multifocal infiltrates characteristic of atypical infection (COVID-19); inflammatory markers with D-dimer of 10.27, ferritin 203, LDH 230, CRP 19.  Patient started empirically on a steroids, bronchodilators, oxygen supplementation and remdesivir.  Covid test pending.  Review of Systems: As per HPI otherwise all other systems reviewed and are negative.   Past Medical History:  Diagnosis Date  . Arthritis   . COPD (chronic obstructive pulmonary disease) ([place])   . Diverticulosis   . Essential hypertension, benign   . Hx of colonic  polyps   . Hypothyroidism   . IBS (irritable bowel syndrome)   . Irritable bowel syndrome   . Ketoacidosis   . Mixed hyperlipidemia   . PTSD (post-traumatic stress disorder)   . S/P colonoscopy Jan 2011   RMR: pancolonic diverticula, polypoid rectal mucosa with  prominent lymphoid aggregates, repeat in Jan 2016   . Shortness of breath dyspnea   . Stress incontinence, female   . Thyroid disease   . Tobacco use   . Type 2 diabetes mellitus ([place])     Past Surgical History:  Procedure Laterality Date  . Bilateral tubal ligation    . BREAST BIOPSY Left 10/16/2014   negative  . CHOLECYSTECTOMY  09/11/2012   Procedure: LAPAROSCOPIC CHOLECYSTECTOMY;  Surgeon: Jamesetta So, MD;  Location: [facility];  Service: General;  Laterality: N/A;  . COLONOSCOPY  11/17/2009   Dr. Gala Romney: normal rectum, pancolonic diverticula, polyps benign, no adenomas.   . COLONOSCOPY N/A 02/12/2015   Dr. Gala Romney: colonic diverticulosis, multiple colonic polyps (tubular adenomas), negative segmental biopsies. Surveillance in 2021  . ESOPHAGOGASTRODUODENOSCOPY  11/23/2011   Dr. Gala Romney: Erosive reflux esophagitis; small hiatal hernia; esophagus dilated empirically  . MALONEY DILATION  11/23/2011   Procedure: Venia Minks DILATION;  Surgeon: Daneil Dolin, MD;  Location: [facility] SUITE;  Service: Endoscopy;  Laterality: N/A;  . SKIN LESION EXCISION     on buttocks  . TUBAL LIGATION      Social History  reports that she has been smoking cigarettes. She has a 86.00 pack-year smoking history. She has never used smokeless tobacco. She reports that she does not drink alcohol and does not use drugs.  No Known Allergies  Family History  Problem Relation  Age of Onset  . Schizophrenia Brother   . Stroke Brother   . Cancer Mother        unsure what type  . Heart disease Father   . Heart attack Father   . Coronary artery disease Other   . Diabetes type II Other   . Cancer Son        bladder  . Colon cancer Neg Hx     Prior to  Admission medications   Medication Sig Start Date End Date Taking? Authorizing Provider  amLODipine (NORVASC) 5 MG tablet Take 1 tablet (5 mg total) by mouth daily. 03/21/20   Barton Dubois, MD  Calcium 600-200 MG-UNIT tablet Take 1 tablet by mouth daily.    [provider]  colestipol (COLESTID) 1 g tablet TAKE 2 TABLETS DAILY FOR DIARRHEA. DO NOT TAKE WITHIN 2 HOURS OF OTHER ORAL MEDICATIONS. HOLD FOR CONSTIPATION. Patient not taking: Reported on 12/01/2020 05/23/20   Carlis Stable, NP  divalproex (DEPAKOTE ER) 500 MG 24 hr tablet Take 2 tablets (1,000 mg total) by mouth every evening. 11/21/20   Norman Clay, MD  divalproex (DEPAKOTE) 250 MG DR tablet Take 1 tablet (250 mg total) by mouth daily. 11/21/20   Norman Clay, MD  furosemide (LASIX) 20 MG tablet Take 20 mg by mouth daily.     [provider]  gabapentin (NEURONTIN) 100 MG capsule Take 100 mg by mouth 3 (three) times daily.     [provider]  insulin glargine (LANTUS) 100 UNIT/ML injection Inject 0.45 mLs (45 Units total) into the skin 2 (two) times daily. 03/20/20   Barton Dubois, MD  insulin lispro (HUMALOG) 100 UNIT/ML injection Inject 15-24 Units into the skin 3 (three) times daily with meals. Per sliding scale. 151-200=18 units; 201-250= 9 units; 251-300=20 units; 301-350=21 units; 351-400= 22 units; 401-450=23 units; 500-551=24 units. Patient not taking: Reported on 12/01/2020    [provider]  levothyroxine (SYNTHROID) 200 MCG tablet Take 200 mcg by mouth daily before breakfast.     [provider]  linagliptin (TRADJENTA) 5 MG TABS tablet Take 5 mg by mouth daily.     [provider]  lisinopril (PRINIVIL,ZESTRIL) 20 MG tablet Take 20 mg by mouth daily.    [provider]  loperamide (IMODIUM) 2 MG capsule Take by mouth as needed for diarrhea or loose stools.    [provider]  mirtazapine (REMERON) 7.5 MG tablet Take 1 tablet (7.5 mg total) by mouth at  bedtime. 11/21/20   Norman Clay, MD  nicotine (NICODERM CQ - DOSED IN MG/24 HOURS) 21 mg/24hr patch Place 1 patch (21 mg total) onto the skin daily. 03/21/20   Barton Dubois, MD  NOVOLOG FLEXPEN 100 UNIT/ML FlexPen Inject 15 Units into the skin in the morning, at noon, and at bedtime. 10/13/20   [provider]  ondansetron (ZOFRAN) 4 MG tablet Take 1 tablet (4 mg total) by mouth every 8 (eight) hours as needed for nausea or vomiting. 10/17/18   Carlis Stable, NP  pantoprazole (PROTONIX) 40 MG tablet TAKE 1 TABLET BY MOUTH ONCE DAILY 30 MINTUES BEFORE BREAKFAST. Patient taking differently: Take 40 mg by mouth daily before breakfast. 05/23/20   Carlis Stable, NP  PARoxetine (PAXIL) 40 MG tablet Take 1 tablet (40 mg total) by mouth daily. 11/23/20   Norman Clay, MD  PROAIR HFA 108 [phone] Base) MCG/ACT inhaler Inhale 1-2 puffs into the lungs every 6 (six) hours as needed for wheezing or shortness  of breath.  02/25/20   [provider]  QUEtiapine (SEROQUEL) 100 MG tablet Take 1 tablet (100 mg total) by mouth at bedtime. 11/21/20   Norman Clay, MD  simvastatin (ZOCOR) 40 MG tablet Take 40 mg by mouth at bedtime.     [provider]    Physical Exam: Vitals:   12/01/20 1145 12/01/20 1253 12/01/20 1340 12/01/20 1744  BP: 102/62 (!) 160/75 (!) 171/75 (!) 175/72  Pulse: (!) 104 98 (!) 110 (!) 109  Resp: (!) 21 19 (!) 21 20  Temp: 98 F (36.7 C)  98.3 F (36.8 C) 98.4 F (36.9 C)  TempSrc: Oral  Oral Oral  SpO2: 92% 91% 91% 95%  Weight:      Height:        Constitutional: Afebrile currently; having difficulty speaking in full sentences demonstrating tachypnea.  2-4 L nasal cannula supplementation in place. Vitals:   12/01/20 1145 12/01/20 1253 12/01/20 1340 12/01/20 1744  BP: 102/62 (!) 160/75 (!) 171/75 (!) 175/72  Pulse: (!) 104 98 (!) 110 (!) 109  Resp: (!) 21 19 (!) 21 20  Temp: 98 F (36.7 C)  98.3 F (36.8 C) 98.4 F (36.9 C)  TempSrc: Oral  Oral Oral  SpO2:  92% 91% 91% 95%  Weight:      Height:       Eyes: PERRL, lids and conjunctivae normal; no icterus, no nystagmus. ENMT: Mucous membranes are moist. Posterior pharynx clear of any exudate or lesions. Neck: normal, supple, no masses, no thyromegaly Respiratory: Positive tachypnea, difficulty speaking in full sentences; bilateral rhonchi appreciated.  Positive expiratory wheezing.  No using accessory muscle. Cardiovascular: Sinus tachycardia, no rubs, no gallops, unable to see JVD with body habitus. Abdomen: Obese, no tenderness, no masses palpated. No hepatosplenomegaly. Bowel sounds positive.  Musculoskeletal: no cyanosis or clubbing; no joint deformity appreciated.  Good range of motion. Skin: no petechiae. Neurologic: CN 2-12 grossly intact. Sensation intact, no focal deficit. Psychiatric: Normal judgment and insight. Alert and oriented x 3. Normal mood.    Labs on Admission: I have personally reviewed following labs and imaging studies  CBC: Recent Labs  Lab 12/01/20 1013 12/01/20 1612  WBC 9.7 9.3  NEUTROABS 7.8*  --   HGB 13.8 14.1  HCT 42.0 43.9  MCV 91.9 94.2  PLT 228 [id number]    Basic Metabolic Panel: Recent Labs  Lab 12/01/20 1013 12/01/20 1612  NA 135  --   K 4.5  --   CL 96*  --   CO2 25  --   GLUCOSE 399*  --   BUN 35*  --   CREATININE 1.64* 1.35*  CALCIUM 10.1  --     GFR: Estimated Creatinine Clearance: 46.9 mL/min (A) (by C-G formula based on SCr of 1.35 mg/dL (H)).  Liver Function Tests: Recent Labs  Lab 12/01/20 1013  AST 16  ALT 12  ALKPHOS 80  BILITOT 1.1  PROT 6.9  ALBUMIN 3.0*    Urine analysis:    Component Value Date/Time   COLORURINE YELLOW 03/18/2020 1217   APPEARANCEUR CLEAR 03/18/2020 1217   LABSPEC 1.010 03/18/2020 1217   PHURINE 7.0 03/18/2020 1217   GLUCOSEU NEGATIVE 03/18/2020 1217   HGBUR SMALL (A) 03/18/2020 [place] 03/18/2020 [place] 03/18/2020 1217   PROTEINUR NEGATIVE 03/18/2020  1217   UROBILINOGEN 1.0 01/10/2015 1754   NITRITE POSITIVE (A) 03/18/2020 1217   LEUKOCYTESUR SMALL (A) 03/18/2020 1217    Radiological Exams  on Admission: CT Chest Wo Contrast  Result Date: 12/01/2020 CLINICAL DATA:  Decreased oxygen saturation EXAM: CT CHEST WITHOUT CONTRAST TECHNIQUE: Multidetector CT imaging of the chest was performed following the standard protocol without IV contrast. COMPARISON:  Chest radiograph December 01, 2020 FINDINGS: Cardiovascular: There is no thoracic aortic aneurysm. There are foci of great vessel calcification. There are foci aortic atherosclerosis as well as multiple foci of coronary artery calcification. A small amount of pleural fluid is within physiologic range. No pericardial thickening evident. Mediastinum/Nodes: Thyroid is diminutive. No thyroid lesions are evident. There is an aortopulmonary window lymph node measuring 1.2 x 1.0 cm. There is a subcarinal lymph node measuring 1.3 x 1.2 cm. There are scattered subcentimeter mediastinal and axillary lymph nodes elsewhere. There is no appreciable esophageal lesion. Lungs/Pleura: There is scarring in the apices. There are scattered areas of ill-defined airspace opacity throughout the lungs bilaterally without consolidation. No pleural effusion. No pneumothorax. Trachea and major bronchial structures appear patent. Upper Abdomen: There is upper abdominal aortic atherosclerosis. Gallbladder is absent. Visualized upper abdominal structures otherwise appear unremarkable. Musculoskeletal: No blastic or lytic bone lesions. No evident chest wall lesions. IMPRESSION: 1. Multifocal airspace opacity throughout the lungs without consolidation. This appearance is felt to be indicative of atypical organism pneumonia. Correlation with COVID-19 status advised. 2. Mildly prominent aortopulmonary window and subcarinal lymph nodes likely are of reactive etiology given the underlying parenchymal lung changes. 3. Aortic atherosclerosis.  Foci of great vessel and coronary artery calcification noted. 4.  Absent gallbladder. Aortic Atherosclerosis (ICD10-I70.0). Electronically Signed   By: Lowella Grip III M.D.   On: 12/01/2020 14:15   DG Chest Port 1 View  Result Date: 12/01/2020 CLINICAL DATA:  Shortness of breath and hypoxia EXAM: PORTABLE CHEST 1 VIEW COMPARISON:  11/30/2017 FINDINGS: Borderline cardiomegaly likely accentuated by mediastinal fat. Interstitial coarsening and hazy density at the bases. No effusion, consolidation, or pneumothorax. Artifact from EKG leads. IMPRESSION: Hazy density in the lower lungs which could be atypical infection or vascular congestion. Electronically Signed   By: Monte Fantasia M.D.   On: 12/01/2020 10:30    EKG: Independently reviewed.  Sinus tachycardia appreciated; heart rate 100. No acute ischemic changes.  Normal QT.  Assessment/Plan 1-Acute respiratory failure with hypoxia -With high concerns for Covid 19 infection -Patient has a prior history of smoking with most likely component of undiagnosed COPD. -Obesity hypoventilation syndrome and possible component of obstructive sleep apnea also playing a role in her ongoing shortness of breath. -Will initiate treatment with steroids, remdesivir, as needed bronchodilators, vitamin C, zinc and antitussive medications -Close monitoring of patient's strict I's and O's; along with low-sodium diet.  There was concerns of vascular congestion on her lungs images. -D-dimer was over 10; unable to do CT angiogram of her chest due to renal failure.  VQ scan will be pursuit.  Based on results determine the need for lower extremity Dopplers. -Continue supportive care and wean oxygen supplementation as tolerated.  2-uncontrolled type 2 diabetes mellitus with hyperglycemia and nephropathy -Patient started on Tradjenta -Will follow CBGs and adjust hypoglycemic regimen as required -Continue Levemir twice a day and SSI -Check A1c  3-morbid  obesity -Body mass index is 39.48 kg/m. -low calorie diet, portion control and increase physical activity discussed with patient.  4-acute kidney injury on chronic kidney disease a stage IIIb -Minimize the use of nephrotoxic agent -Gentle hydration provided in the ED -Patient advised to keep appropriate oral hydration -Will follow renal function trend.  5-essential hypertension -  Resume home antihypertensive regimen -Holding lisinopril and Lasix initially in the setting of acute kidney injury. -Follow vital signs and adjust medication as needed.  6-gastroesophageal reflux disease/GI prophylaxis -Continue PPI.  7-hypothyroidism -Continue Synthroid.  8-hyperlipidemia -Continue statins  9-history of diabetic neuropathy -Continue Neurontin.  10-depression/anxiety -Interval cooperative -Continue the use of Paxil, Seroquel and Remeron.  11-tobacco abuse -Cessation counseling encouraged. -Continue the use of nicotine patch.  DVT prophylaxis: Heparin  Code Status:   Full code Family Communication:  No family member at bedside. Disposition Plan:   Patient is from:  Home  Anticipated DC to:  Home  Anticipated DC date:  2-3 days  Anticipated DC barriers: Stabilization of respiratory status and hyperglycemia.  Consults called:  None  Admission status: Inpatient, telemetry bed, length of stay more than 2 midnights.  Severity of Illness: Moderate illness; high risk for decompensation.  Patient presenting with shortness of breath, found to be hypoxic and with chest images suggesting multifocal pneumonia in the setting of Covid infection.  Patient is not vaccinated, obese, diabetic and hypertensive.  She was also demonstrating elevated BNP and part of the concerns and findings was from vascular congestion.  Covid test ordered and pending, but inflammatory markers and presentation suggesting COVID infection.    Barton Dubois MD [facility]  How to contact the [facility] Attending  or Consulting provider [place] or covering provider during after hours [place], for this patient?   1. Check the care team in [facility] and look for a) attending/consulting [facility] provider listed and b) the [facility] team listed 2. Log into [email or web address] and use [place]'s universal password to access. If you do not have the password, please contact the hospital operator. 3. Locate the [facility] provider you are looking for under [facility] and page to a number that you can be directly reached. 4. If you still have difficulty reaching the provider, please page the [facility] (Director on Call) for the Hospitalists listed on amion for assistance.  12/01/2020, 6:24 PM

## 2020-12-01 NOTE — Progress Notes (Signed)
Will wait for positive or negative Covid test before administering Combivent. If patient is negative will switch to nebulizers.

## 2020-12-01 NOTE — ED Notes (Signed)
Pt given two heating packs.

## 2020-12-01 NOTE — Progress Notes (Signed)
Inpatient Diabetes Program Recommendations  AACE/ADA: New Consensus Statement on Inpatient Glycemic Control (2015)  Target Ranges:  Prepandial:   less than 140 mg/dL      Peak postprandial:   less than 180 mg/dL (1-2 hours)      Critically ill patients:  140 - 180 mg/dL   Lab Results  Component Value Date   GLUCAP 147 (H) 12/01/2020   HGBA1C 12.3 (H) 03/17/2020    Review of Glycemic Control Results for Claudia Keller, Claudia Keller (MRN [id number]) as of 12/01/2020 14:30  Ref. Range 12/01/2020 10:09 12/01/2020 12:18  Glucose-Capillary Latest Ref Range: 70 - 99 mg/dL 413 (H) 147 (H)   Diabetes history: DM 2 Outpatient Diabetes medications:  Lantus 45 units bid, Humalog 15-24 units bid with meals, Tradjenta 5 mg daily Current orders for Inpatient glycemic control:  None- Patient received Novolog 10 units x 1  Inpatient Diabetes Program Recommendations:    If patient admitted, consider adding Levemir 25 units bid, Novolog 10 units tid with meals (hold if patient eats less than 50% or NPO), and Novolog resistant tid with meals and HS.    Thanks,  Adah Perl, RN, BC-ADM Inpatient Diabetes Coordinator Pager [phone] (8a-5p)

## 2020-12-01 NOTE — ED Triage Notes (Signed)
EMS reports patient called out for breathing problem. Pt was not distressed. Pt saturation was in the 80%. EMS applied oxygen/2L [place], pt 02 sat increased to 93%. Pt is not on oxygen at home. Pt CBG 453 pt has not taken any insulin today. Pt does have an albuterol inhaler but is out of medication. Pt has a history of COPD. Pt also complains of toe pain and moving up the leg. Pt middle left toe appears purple in color. Pt states she lives alone and has bed bugs, states she cannot get anyone to help her because of this.

## 2020-12-01 NOTE — ED Provider Notes (Addendum)
[facility] EMERGENCY DEPARTMENT Provider Note   CSN: [id number] Arrival date & time: 12/01/20  [id number]     History Chief Complaint  Patient presents with  . Shortness of Breath    Claudia Keller is a 70 y.o. female.  Patient brought in by EMS for shortness of breath as well as left foot pain and foot feeling cold.  Patient known to have COPD diabetes bipolar disease.  Patient blood sugar elevated by EMS they got 453.  Also her oxygen saturation on room air was 80%.  They put her on 2 L.  She came up to 93%.  Patient's not on home oxygen.  She states that about 6 years ago she was told that she needed it.  Patient is not vaccinated against Covid.  Patient does have albuterol inhaler but is out of it.  Patient also stated that she is got bedbugs.  Patient denies any chest pain.  Patient states she has not taken her insulin.  In May of this past year patient was admitted for DKA.        Past Medical History:  Diagnosis Date  . Arthritis   . COPD (chronic obstructive pulmonary disease) ([place])   . Diverticulosis   . Essential hypertension, benign   . Hx of colonic polyps   . Hypothyroidism   . IBS (irritable bowel syndrome)   . Irritable bowel syndrome   . Ketoacidosis   . Mixed hyperlipidemia   . PTSD (post-traumatic stress disorder)   . S/P colonoscopy Jan 2011   RMR: pancolonic diverticula, polypoid rectal mucosa with  prominent lymphoid aggregates, repeat in Jan 2016   . Shortness of breath dyspnea   . Stress incontinence, female   . Thyroid disease   . Tobacco use   . Type 2 diabetes mellitus [facility])     Patient Active Problem List   Diagnosis Date Noted  . Segmental colitis without complication ([place])   . Class 2 obesity due to excess calories with body mass index (BMI) of 37.0 to 37.9 in adult   . Ischemic colitis ([place]) 03/19/2020  . Noncompliance with medication regimen 03/18/2020  . Acute renal failure superimposed on stage 3a chronic kidney disease ([place]) 03/18/2020   . DKA (diabetic ketoacidoses) 03/17/2020  . Bipolar II disorder ([place]) 04/17/2018  . PTSD (post-traumatic stress disorder) 04/17/2018  . Personal history of noncompliance with medical treatment, presenting hazards to health 08/17/2016  . Emesis 05/20/2016  . Uncontrolled diabetes mellitus with stage 2 chronic kidney disease ([place]) 09/08/2015  . Essential hypertension, benign 09/08/2015  . Primary hypothyroidism 09/08/2015  . Diverticulosis of colon without hemorrhage   . Nausea vomiting and diarrhea 10/27/2011  . Chest pain 06/24/2011  . Hypercholesterolemia 06/24/2011  . Tobacco abuse 06/24/2011  . IRRITABLE BOWEL SYNDROME 11/12/2009  . Diarrhea 11/12/2009  . OTHER SYMPTOMS INVOLVING DIGESTIVE SYSTEM OTHER 11/12/2009  . History of colonic polyps 11/12/2009  . CLOSED FRACTURE OF METATARSAL BONE 11/30/2007  . HIGH BLOOD PRESSURE 11/30/2007    Past Surgical History:  Procedure Laterality Date  . Bilateral tubal ligation    . BREAST BIOPSY Left 10/16/2014   negative  . CHOLECYSTECTOMY  09/11/2012   Procedure: LAPAROSCOPIC CHOLECYSTECTOMY;  Surgeon: Jamesetta So, MD;  Location: [facility] ORS;  Service: General;  Laterality: N/A;  . COLONOSCOPY  11/17/2009   Dr. Gala Romney: normal rectum, pancolonic diverticula, polyps benign, no adenomas.   . COLONOSCOPY N/A 02/12/2015   Dr. Gala Romney: colonic diverticulosis, multiple colonic polyps (tubular adenomas), negative segmental  biopsies. Surveillance in 2021  . ESOPHAGOGASTRODUODENOSCOPY  11/23/2011   Dr. Jena Gauss: Erosive reflux esophagitis; small hiatal hernia; esophagus dilated empirically  . MALONEY DILATION  11/23/2011   Procedure: Elease Hashimoto DILATION;  Surgeon: Corbin Ade, MD;  Location: [facility] ENDO SUITE;  Service: Endoscopy;  Laterality: N/A;  . SKIN LESION EXCISION     on buttocks  . TUBAL LIGATION       OB History    Gravida  4   Para  4   Term  4   Preterm      AB      Living  4     SAB      IAB      Ectopic      Multiple       Live Births              Family History  Problem Relation Age of Onset  . Schizophrenia Brother   . Stroke Brother   . Cancer Mother        unsure what type  . Heart disease Father   . Heart attack Father   . Coronary artery disease Other   . Diabetes type II Other   . Cancer Son        bladder  . Colon cancer Neg Hx     Social History   Tobacco Use  . Smoking status: Current Every Day Smoker    Packs/day: 2.00    Years: 43.00    Pack years: 86.00    Types: Cigarettes  . Smokeless tobacco: Never Used  Substance Use Topics  . Alcohol use: No    Alcohol/week: 0.0 standard drinks  . Drug use: No    Home Medications Prior to Admission medications   Medication Sig Start Date End Date Taking? Authorizing Provider  amLODipine (NORVASC) 5 MG tablet Take 1 tablet (5 mg total) by mouth daily. 03/21/20   Vassie Loll, MD  Calcium 600-200 MG-UNIT tablet Take 1 tablet by mouth daily.    [provider]  colestipol (COLESTID) 1 g tablet TAKE 2 TABLETS DAILY FOR DIARRHEA. DO NOT TAKE WITHIN 2 HOURS OF OTHER ORAL MEDICATIONS. HOLD FOR CONSTIPATION. 05/23/20   Anice Paganini, NP  divalproex (DEPAKOTE ER) 500 MG 24 hr tablet Take 2 tablets (1,000 mg total) by mouth every evening. 11/21/20   Neysa Hotter, MD  divalproex (DEPAKOTE) 250 MG DR tablet Take 1 tablet (250 mg total) by mouth daily. 11/21/20   Neysa Hotter, MD  furosemide (LASIX) 20 MG tablet Take 20 mg by mouth daily.     [provider]  gabapentin (NEURONTIN) 100 MG capsule Take 100 mg by mouth 3 (three) times daily.     [provider]  insulin glargine (LANTUS) 100 UNIT/ML injection Inject 0.45 mLs (45 Units total) into the skin 2 (two) times daily. 03/20/20   Vassie Loll, MD  insulin lispro (HUMALOG) 100 UNIT/ML injection Inject 15-24 Units into the skin 3 (three) times daily with meals. Per sliding scale. 151-200=18 units; 201-250= 9 units; 251-300=20 units; 301-350=21 units; 351-400= 22 units;  401-450=23 units; 500-551=24 units.    [provider]  levothyroxine (SYNTHROID) 200 MCG tablet Take 200 mcg by mouth daily before breakfast.     [provider]  linagliptin (TRADJENTA) 5 MG TABS tablet Take 5 mg by mouth daily.     [provider]  lisinopril (PRINIVIL,ZESTRIL) 20 MG tablet Take 20 mg by mouth daily.    [provider]  loperamide (IMODIUM) 2 MG capsule Take by mouth as needed for diarrhea or loose stools.    [provider]  mirtazapine (REMERON) 7.5 MG tablet Take 1 tablet (7.5 mg total) by mouth at bedtime. 11/21/20   Norman Clay, MD  nicotine (NICODERM CQ - DOSED IN MG/24 HOURS) 21 mg/24hr patch Place 1 patch (21 mg total) onto the skin daily. 03/21/20   Barton Dubois, MD  ondansetron (ZOFRAN) 4 MG tablet Take 1 tablet (4 mg total) by mouth every 8 (eight) hours as needed for nausea or vomiting. 10/17/18   Carlis Stable, NP  pantoprazole (PROTONIX) 40 MG tablet TAKE 1 TABLET BY MOUTH ONCE DAILY 30 MINTUES BEFORE BREAKFAST. 05/23/20   Carlis Stable, NP  PARoxetine (PAXIL) 40 MG tablet Take 1 tablet (40 mg total) by mouth daily. 11/23/20   Norman Clay, MD  PROAIR HFA 108 [phone] Base) MCG/ACT inhaler Inhale 1-2 puffs into the lungs every 6 (six) hours as needed for wheezing or shortness of breath.  02/25/20   [provider]  QUEtiapine (SEROQUEL) 100 MG tablet Take 1 tablet (100 mg total) by mouth at bedtime. 11/21/20   Norman Clay, MD  simvastatin (ZOCOR) 40 MG tablet Take 40 mg by mouth at bedtime.     [provider]    Allergies    Patient has no known allergies.  Review of Systems   Review of Systems  Constitutional: Negative for chills and fever.  HENT: Negative for rhinorrhea and sore throat.   Eyes: Negative for visual disturbance.  Respiratory: Positive for shortness of breath. Negative for cough.   Cardiovascular: Negative for chest pain and leg swelling.  Gastrointestinal: Negative for abdominal pain,  diarrhea, nausea and vomiting.  Genitourinary: Negative for dysuria.  Musculoskeletal: Negative for back pain and neck pain.  Skin: Negative for rash.  Neurological: Negative for dizziness, light-headedness and headaches.  Hematological: Does not bruise/bleed easily.  Psychiatric/Behavioral: Negative for confusion.    Physical Exam Updated Vital Signs BP (!) 171/75 (BP Location: Left Arm)   Pulse (!) 110   Temp 98.3 F (36.8 C) (Oral)   Resp (!) 21   Ht 1.626 m (5\' 4" )   Wt 104.3 kg   SpO2 91%   BMI 39.48 kg/m   Physical Exam Vitals and nursing note reviewed.  Constitutional:      General: She is not in acute distress.    Appearance: Normal appearance. She is well-developed and well-nourished.  HENT:     Head: Normocephalic and atraumatic.  Eyes:     Extraocular Movements: Extraocular movements intact.     Conjunctiva/sclera: Conjunctivae normal.     Pupils: Pupils are equal, round, and reactive to light.  Cardiovascular:     Rate and Rhythm: Normal rate and regular rhythm.     Heart sounds: No murmur heard.   Pulmonary:     Effort: Pulmonary effort is normal. No respiratory distress.     Breath sounds: Wheezing present.     Comments: Bilateral faint wheezing.  But not severe. Abdominal:     Palpations: Abdomen is soft.     Tenderness: There is no abdominal tenderness.  Musculoskeletal:        General: No swelling or edema.     Cervical back: Neck supple.     Right lower leg: No edema.     Left lower leg: No edema.     Comments: Patient's left foot is cooler than the right.  Toes with just some slight purplish.  Cap refill to both feet is about the same its about 3 seconds.  Very faint dorsalis pedis pulse to palpation.  No leg swelling.  Both calfs are warm both thighs are warm.  No lesions to the feet or toes.  Skin:    General: Skin is warm and dry.     Capillary Refill: Capillary refill takes 2 to 3 seconds.     Findings: No lesion or rash.  Neurological:      General: No focal deficit present.     Mental Status: She is alert and oriented to person, place, and time.     Cranial Nerves: No cranial nerve deficit.     Sensory: No sensory deficit.     Motor: No weakness.  Psychiatric:        Mood and Affect: Mood and affect normal.     ED Results / Procedures / Treatments   Labs (all labs ordered are listed, but only abnormal results are displayed) Labs Reviewed  CBC WITH DIFFERENTIAL/PLATELET - Abnormal; Notable for the following components:      Result Value   Neutro Abs 7.8 (*)    Abs Immature Granulocytes 0.61 (*)    All other components within normal limits  COMPREHENSIVE METABOLIC PANEL - Abnormal; Notable for the following components:   Chloride 96 (*)    Glucose, Bld 399 (*)    BUN 35 (*)    Creatinine, Ser 1.64 (*)    Albumin 3.0 (*)    GFR, Estimated 34 (*)    All other components within normal limits  BRAIN NATRIURETIC PEPTIDE - Abnormal; Notable for the following components:   B Natriuretic Peptide 174.0 (*)    All other components within normal limits  CBG MONITORING, ED - Abnormal; Notable for the following components:   Glucose-Capillary 413 (*)    All other components within normal limits  CBG MONITORING, ED - Abnormal; Notable for the following components:   Glucose-Capillary 147 (*)    All other components within normal limits  SARS CORONAVIRUS 2 (TAT 6-24 HRS)  TROPONIN I (HIGH SENSITIVITY)  TROPONIN I (HIGH SENSITIVITY)    EKG EKG Interpretation  Date/Time:  Monday December 01 2020 10:13:31 EST Ventricular Rate:  100 PR Interval:    QRS Duration: 76 QT Interval:  341 QTC Calculation: 440 R Axis:   55 Text Interpretation: Sinus tachycardia Low voltage, precordial leads Borderline T wave abnormalities Confirmed by Fredia Sorrow [phone]) on 12/01/2020 11:19:42 AM   Radiology CT Chest Wo Contrast  Result Date: 12/01/2020 CLINICAL DATA:  Decreased oxygen saturation EXAM: CT CHEST WITHOUT CONTRAST  TECHNIQUE: Multidetector CT imaging of the chest was performed following the standard protocol without IV contrast. COMPARISON:  Chest radiograph December 01, 2020 FINDINGS: Cardiovascular: There is no thoracic aortic aneurysm. There are foci of great vessel calcification. There are foci aortic atherosclerosis as well as multiple foci of coronary artery calcification. A small amount of pleural fluid is within physiologic range. No pericardial thickening evident. Mediastinum/Nodes: Thyroid is diminutive. No thyroid lesions are evident. There is an aortopulmonary window lymph node measuring 1.2 x 1.0 cm. There is a subcarinal lymph node measuring 1.3 x 1.2 cm. There are scattered subcentimeter mediastinal and axillary lymph nodes elsewhere. There is no appreciable esophageal lesion. Lungs/Pleura: There is scarring in the apices. There are scattered areas of ill-defined airspace opacity throughout the lungs bilaterally without consolidation. No pleural effusion. No pneumothorax. Trachea and major bronchial structures appear patent. Upper Abdomen: There is upper abdominal aortic  atherosclerosis. Gallbladder is absent. Visualized upper abdominal structures otherwise appear unremarkable. Musculoskeletal: No blastic or lytic bone lesions. No evident chest wall lesions. IMPRESSION: 1. Multifocal airspace opacity throughout the lungs without consolidation. This appearance is felt to be indicative of atypical organism pneumonia. Correlation with COVID-19 status advised. 2. Mildly prominent aortopulmonary window and subcarinal lymph nodes likely are of reactive etiology given the underlying parenchymal lung changes. 3. Aortic atherosclerosis. Foci of great vessel and coronary artery calcification noted. 4.  Absent gallbladder. Aortic Atherosclerosis (ICD10-I70.0). Electronically Signed   By: Lowella Grip III M.D.   On: 12/01/2020 14:15   DG Chest Port 1 View  Result Date: 12/01/2020 CLINICAL DATA:  Shortness of breath  and hypoxia EXAM: PORTABLE CHEST 1 VIEW COMPARISON:  11/30/2017 FINDINGS: Borderline cardiomegaly likely accentuated by mediastinal fat. Interstitial coarsening and hazy density at the bases. No effusion, consolidation, or pneumothorax. Artifact from EKG leads. IMPRESSION: Hazy density in the lower lungs which could be atypical infection or vascular congestion. Electronically Signed   By: Monte Fantasia M.D.   On: 12/01/2020 10:30    Procedures Procedures   CRITICAL CARE Performed by: Fredia Sorrow Total critical care time: 35 minutes Critical care time was exclusive of separately billable procedures and treating other patients. Critical care was necessary to treat or prevent imminent or life-threatening deterioration. Critical care was time spent personally by me on the following activities: development of treatment plan with patient and/or surrogate as well as nursing, discussions with consultants, evaluation of patient's response to treatment, examination of patient, obtaining history from patient or surrogate, ordering and performing treatments and interventions, ordering and review of laboratory studies, ordering and review of radiographic studies, pulse oximetry and re-evaluation of patient's condition.  Medications Ordered in ED Medications  0.9 %  sodium chloride infusion ( Intravenous New Bag/Given 12/01/20 1337)  sodium chloride 0.9 % bolus 500 mL (0 mLs Intravenous Stopped 12/01/20 1337)  insulin aspart (novoLOG) injection 10 Units (10 Units Intravenous Given 12/01/20 1104)    ED Course  I have reviewed the triage vital signs and the nursing notes.  Pertinent labs & imaging results that were available during my care of the patient were reviewed by me and considered in my medical decision making (see chart for details).    MDM Rules/Calculators/A&P                         Patient's blood sugar here elevated in the 400s.  Will give 500 cc bolus and some IV fluids and 10 units of  regular insulin IV.  Patient without significant wheezing but will give albuterol inhaler treatment.  Chest x-ray raise some concerns of may be some vascular congestion.  No leg swelling.  Suspect patient does have some peripheral vascular disease.  And probably has a little worsening blood flow to the left foot.  But no signs of acute ischemia.  Will order Covid test on her.  Patient is unvaccinated.  No visible signs of bedbugs on my exam  Patient clearly needs oxygen.  Possible patient is needed home oxygen for a period of time.  This will probably mandate admission.  Patient's labs are pending.  May also have some signs of DKA.  CT chest without contrast due to her renal function consistent with multifocal pneumonia.  Highly suspicious for COVID-19 infection.  That would explain her hypoxia.  Also does have the hyperglycemia.  But no signs of DKA here today.  Will contact hospitalist  for admission.  Final Clinical Impression(s) / ED Diagnoses Final diagnoses:  SOB (shortness of breath)  Hyperglycemia  Hypoxia  Multifocal pneumonia  Suspected COVID-19 virus infection    Rx / DC Orders ED Discharge Orders    None       Fredia Sorrow, MD 12/01/20 1423    Fredia Sorrow, MD 12/01/20 1429

## 2020-12-02 DIAGNOSIS — U071 COVID-19: Principal | ICD-10-CM

## 2020-12-02 DIAGNOSIS — J1282 Pneumonia due to coronavirus disease 2019: Secondary | ICD-10-CM

## 2020-12-02 DIAGNOSIS — Z720 Tobacco use: Secondary | ICD-10-CM

## 2020-12-02 LAB — COMPREHENSIVE METABOLIC PANEL
ALT: 28 U/L (ref 0–44)
AST: 79 U/L — ABNORMAL HIGH (ref 15–41)
Albumin: 2.8 g/dL — ABNORMAL LOW (ref 3.5–5.0)
Alkaline Phosphatase: 71 U/L (ref 38–126)
Anion gap: 13 (ref 5–15)
BUN: 38 mg/dL — ABNORMAL HIGH (ref 8–23)
CO2: 26 mmol/L (ref 22–32)
Calcium: 9.5 mg/dL (ref 8.9–10.3)
Chloride: 103 mmol/L (ref 98–111)
Creatinine, Ser: 1.37 mg/dL — ABNORMAL HIGH (ref 0.44–1.00)
GFR, Estimated: 42 mL/min — ABNORMAL LOW (ref >60)
Glucose, Bld: 200 mg/dL — ABNORMAL HIGH (ref 70–99)
Potassium: 5 mmol/L (ref 3.5–5.1)
Sodium: 142 mmol/L (ref 135–145)
Total Bilirubin: 0.8 mg/dL (ref 0.3–1.2)
Total Protein: 6.8 g/dL (ref 6.5–8.1)

## 2020-12-02 LAB — D-DIMER, QUANTITATIVE: D-Dimer, Quant: 10.54 ug/mL-FEU — ABNORMAL HIGH (ref 0.00–0.50)

## 2020-12-02 LAB — CBC WITH DIFFERENTIAL/PLATELET
Abs Immature Granulocytes: 0.45 10*3/uL — ABNORMAL HIGH (ref 0.00–0.07)
Basophils Absolute: 0.1 10*3/uL (ref 0.0–0.1)
Basophils Relative: 1 %
Eosinophils Absolute: 0 10*3/uL (ref 0.0–0.5)
Eosinophils Relative: 0 %
HCT: 44.5 % (ref 36.0–46.0)
Hemoglobin: 14.4 g/dL (ref 12.0–15.0)
Immature Granulocytes: 6 %
Lymphocytes Relative: 12 %
Lymphs Abs: 0.9 10*3/uL (ref 0.7–4.0)
MCH: 29.9 pg (ref 26.0–34.0)
MCHC: 32.4 g/dL (ref 30.0–36.0)
MCV: 92.5 fL (ref 80.0–100.0)
Monocytes Absolute: 0.2 10*3/uL (ref 0.1–1.0)
Monocytes Relative: 3 %
Neutro Abs: 6.2 10*3/uL (ref 1.7–7.7)
Neutrophils Relative %: 78 %
Platelets: 244 10*3/uL (ref 150–400)
RBC: 4.81 MIL/uL (ref 3.87–5.11)
RDW: 14.3 % (ref 11.5–15.5)
WBC: 7.9 10*3/uL (ref 4.0–10.5)
nRBC: 0 % (ref 0.0–0.2)

## 2020-12-02 LAB — MAGNESIUM: Magnesium: 1.6 mg/dL — ABNORMAL LOW (ref 1.7–2.4)

## 2020-12-02 LAB — C-REACTIVE PROTEIN: CRP: 15.5 mg/dL — ABNORMAL HIGH (ref <1.0)

## 2020-12-02 LAB — CBG MONITORING, ED
Glucose-Capillary: 199 mg/dL — ABNORMAL HIGH (ref 70–99)
Glucose-Capillary: 248 mg/dL — ABNORMAL HIGH (ref 70–99)
Glucose-Capillary: 158 mg/dL — ABNORMAL HIGH (ref 70–99)
Glucose-Capillary: 157 mg/dL — ABNORMAL HIGH (ref 70–99)

## 2020-12-02 LAB — PHOSPHORUS: Phosphorus: 3.7 mg/dL (ref 2.5–4.6)

## 2020-12-02 MED ORDER — ALBUTEROL SULFATE HFA 108 (90 BASE) MCG/ACT IN AERS: 2.0000 | INHALATION_SPRAY | RESPIRATORY_TRACT | Status: DC | PRN

## 2020-12-02 MED ORDER — IPRATROPIUM-ALBUTEROL 20-100 MCG/ACT IN AERS
1.0000 | INHALATION_SPRAY | Freq: Three times a day (TID) | RESPIRATORY_TRACT | Status: DC
  Administered 2020-12-03: 1 via RESPIRATORY_TRACT

## 2020-12-02 NOTE — ED Notes (Signed)
Levemir due at 1000 has not been delivered to ED pyxis yet. Pharmacy has been notified. This was passed on to Wallace, next shift RN that it still needs to be administered due to being delivered late.

## 2020-12-02 NOTE — ED Notes (Addendum)
Pt provided with lunch tray at this time.   1:56 PM This RN rounded on patient, found to have consumed roughly 4 bites of lunch tray. Pt reports a decrease in appetite but will "eat a little more later." Meal coverage insulin held at this time.

## 2020-12-02 NOTE — ED Notes (Signed)
Pt rolled and changed into a clean gown, bed linens, chuck pad, brief and Purewick. Sacral Mepelix pad applied to sacrum for skin protection. Pt repositioned to laying on right side at this time; provided with a clean sheet and blanket. Bed is locked in the lowest position, side rails x2, call bell within reach.

## 2020-12-02 NOTE — Progress Notes (Signed)
IS and flutter given and instructed with pt. Pt had good effort  Will need encouragement  and coaching

## 2020-12-02 NOTE — ED Notes (Signed)

## 2020-12-02 NOTE — TOC Initial Note (Addendum)
Transition of Care [facility]) - Initial/Assessment Note    Patient Details  Name: Claudia Keller MRN: [id number] Date of Birth: Apr 12, 1951  Transition of Care [facility]) CM/SW Contact:    Ihor Gully, LCSW Phone Number: 12/02/2020, 4:26 PM  Clinical Narrative:                 Patient from home alone. Considered high risk for readmission. Admitted COVID+. Patient is out of home inhalers. Patient has bedbugs and this inhibits her getting in home assistance.  Message left for son, Charlotte Crumb, as patient's room number continues to be busy. TOC will continue to follow and address needs as they arise.     Barriers to Discharge: Continued Medical Work up   Patient Goals and CMS Choice        Expected Discharge Plan and Services         Living arrangements for the past 2 months: Apartment                                      Prior Living Arrangements/Services Living arrangements for the past 2 months: Apartment Lives with:: Self Patient language and need for interpreter reviewed:: Yes Do you feel safe going back to the place where you live?: Yes      Need for Family Participation in Patient Care: Yes (Comment) Care giver support system in place?: Yes (comment) Current home services: DME Criminal Activity/Legal Involvement Pertinent to Current Situation/Hospitalization: No - Comment as needed  Activities of Daily Living      Permission Sought/Granted                  Emotional Assessment Appearance:: Appears stated age     Orientation: : Oriented to Self,Oriented to Place,Oriented to  Time,Oriented to Situation Alcohol / Substance Use: Not Applicable Psych Involvement: No (comment)  Admission diagnosis:  Acute respiratory failure with hypoxia ([place]) [J96.01] Patient Active Problem List   Diagnosis Date Noted  . Acute respiratory failure with hypoxia ([place]) 12/01/2020  . Segmental colitis without complication ([place])   . Class 2 obesity due to excess calories  with body mass index (BMI) of 37.0 to 37.9 in adult   . Ischemic colitis ([place]) 03/19/2020  . Noncompliance with medication regimen 03/18/2020  . Acute renal failure superimposed on stage 3a chronic kidney disease ([place]) 03/18/2020  . DKA (diabetic ketoacidoses) 03/17/2020  . Bipolar II disorder ([place]) 04/17/2018  . PTSD (post-traumatic stress disorder) 04/17/2018  . Personal history of noncompliance with medical treatment, presenting hazards to health 08/17/2016  . Emesis 05/20/2016  . Uncontrolled diabetes mellitus with stage 2 chronic kidney disease ([place]) 09/08/2015  . Essential hypertension, benign 09/08/2015  . Primary hypothyroidism 09/08/2015  . Diverticulosis of colon without hemorrhage   . Nausea vomiting and diarrhea 10/27/2011  . Chest pain 06/24/2011  . Hypercholesterolemia 06/24/2011  . Tobacco abuse 06/24/2011  . IRRITABLE BOWEL SYNDROME 11/12/2009  . Diarrhea 11/12/2009  . OTHER SYMPTOMS INVOLVING DIGESTIVE SYSTEM OTHER 11/12/2009  . History of colonic polyps 11/12/2009  . CLOSED FRACTURE OF METATARSAL BONE 11/30/2007  . HIGH BLOOD PRESSURE 11/30/2007   PCP:  Rosita Fire, MD Pharmacy:   [place], [place] [address] Phone: [phone] Fax: [place], [place] [address] Phone:  [phone] Fax: [phone]     Social Determinants of Health (SDOH) Interventions    Readmission Risk Interventions No flowsheet data found.

## 2020-12-02 NOTE — Progress Notes (Signed)
PROGRESS NOTE    Claudia Keller  ZOX:[id number] DOB: 11/08/1951 DOA: 12/01/2020 PCP: Rosita Fire, MD   Chief Complaint  Patient presents with  . Shortness of Breath    Brief Narrative:  Claudia Keller is a 70 y.o. female with medical history significant of COPD, morbid obesity, hypertension, hypothyroidism, hyperlipidemia, type 2 diabetes mellitus with nephropathy and neuropathy, tobacco abuse and depression/anxiety; presented to the hospital secondary to shortness of breath nonproductive cough, general malaise.  Patient reports symptoms have been present for the last 3 to 4 days and worsening in the last 48 hours prior to admission.  EMS was called and on arrival to her house patient was found hypoglycemic with CBGs 453 and also hypoxic with oxygen saturation of 80% on room air.  Patient denies fever, chills, chest pain, nausea, vomiting, dysuria, hematuria, melena, hematochezia, focal weakness or sick contacts.  Patient reported going out on her home inhalers and felt that she couldn't make it any longer without assistance.   Of note, patient is not vaccinated against Covid.  ED Course: Reporting shortness of breath, dyspnea on exertion, nonproductive coughing spells and general malaise.  Chest images demonstrating vascular congestion and multifocal infiltrates characteristic of atypical infection (COVID-19); inflammatory markers with D-dimer of 10.27, ferritin 203, LDH 230, CRP 19.  Patient started empirically on a steroids, bronchodilators, oxygen supplementation and remdesivir.  Covid test pending.  Assessment & Plan: 1-acute respiratory failure with hypoxia secondary to Covid pneumonia -Continue to follow inflammatory markers -Continue the use of remdesivir and steroids -Continue vitamin C, zinc, as needed bronchodilators and antitussive medications. -Continue to wean oxygen supplementation as tolerated -High concern for underlying obesity hypoventilation syndrome and may be  undiagnosed obstructive sleep apnea. -Patient also with exacerbation of COPD.  2-uncontrolled type 2 diabetes with hyperglycemia and nephropathy -Will continue the use of Tradjenta, sliding scale insulin and Levemir -A1c 10.2  3-morbid obesity -Body mass index is 39.48 kg/m. -Low calorie diet, portion control increase his activity has been discussed with patient.  4-acute kidney injury on chronic kidney disease a stage IIIb -Continue to minimize the use of nephrotoxic agents -Continue to maintain adequate hydration. -Follow-up renal function trend.  5-essential hypertension -Continue current antihypertensive agent -Continue holding lisinopril and Lasix. -Heart healthy diet has been ordered.  6-gastroesophageal reflux disease/GI prophylaxis -Continue PPI.  7-hypothyroidism -Continue Synthroid  8-hyperlipidemia -Continue statins.  9-history of diabetic neuropathy -Continue Neurontin. -Continue better control of patient's diabetes.  10-depression/anxiety -Continue Paxil, Seroquel and Remeron -Mood is a stable.  11-tobacco abuse -Cessation counseling once again encouraged. -Continue nicotine patch.   DVT prophylaxis: Heparin Code Status: Full code Family Communication: No family at bedside; unable to reach patient's son and provided phone number. Disposition:   Status is: Inpatient  Dispo: The patient is from: Home              Anticipated d/c is to: Home              Anticipated d/c date is: 1-2 days              Patient currently no medically stable for discharge, still requiring 3-4 nasal cannula oxygen supplementation (new for her), complaining of intermittent coughing spells and short winded sensation with activity.  Patient denies nausea or vomiting.       Consultants:   None   Procedures:  See below for x-ray reports.  Antimicrobials/antiviral Remdesivir 2 out of 5.  Subjective: Afebrile, complaining of intermittent coughing spells; no nausea  or vomiting.  Still short winded and requiring 3-4 L edema supplementation.  Objective: Vitals:   12/02/20 0816 12/02/20 0830 12/02/20 0900 12/02/20 0930  BP:  124/76 (!) 144/94 131/84  Pulse:  (!) 106 (!) 110 (!) 57  Resp:  17 18 16   Temp:      TempSrc:      SpO2: 95%   92%  Weight:      Height:        Intake/Output Summary (Last 24 hours) at 12/02/2020 1134 Last data filed at 12/01/2020 1907 Gross per 24 hour  Intake 700 ml  Output -  Net 700 ml   Filed Weights   12/01/20 1012  Weight: 104.3 kg    Examination:  General exam: Reports feeling better; still requiring 3-4 L nasal cannula; short winded with activity and expressing intermittent coughing spells.  Patient is afebrile.  Respiratory system: Mild respiratory wheezing appreciated; no using accessory muscle.  Positive scattered rhonchi.  No crackles Cardiovascular system: Rate controlled, no rubs, no gallops, no JVD appreciated on exam. Gastrointestinal system: Abdomen is nondistended, soft and nontender. No organomegaly or masses felt. Normal bowel sounds heard. Central nervous system: Alert and oriented. No focal neurological deficits. Extremities: No cyanosis or clubbing. Skin: No petechiae. Psychiatry: Mood & affect appropriate.     Data Reviewed: I have personally reviewed following labs and imaging studies  CBC: Recent Labs  Lab 12/01/20 1013 12/01/20 1612 12/02/20 0710  WBC 9.7 9.3 7.9  NEUTROABS 7.8*  --  6.2  HGB 13.8 14.1 14.4  HCT 42.0 43.9 44.5  MCV 91.9 94.2 92.5  PLT 228 227 [id number]    Basic Metabolic Panel: Recent Labs  Lab 12/01/20 1013 12/01/20 1612 12/02/20 0710  NA 135  --  142  K 4.5  --  5.0  CL 96*  --  103  CO2 25  --  26  GLUCOSE 399*  --  200*  BUN 35*  --  38*  CREATININE 1.64* 1.35* 1.37*  CALCIUM 10.1  --  9.5  MG  --   --  1.6*  PHOS  --   --  3.7    GFR: Estimated Creatinine Clearance: 46.2 mL/min (A) (by C-G formula based on SCr of 1.37 mg/dL (H)).  Liver  Function Tests: Recent Labs  Lab 12/01/20 1013 12/02/20 0710  AST 16 79*  ALT 12 28  ALKPHOS 80 71  BILITOT 1.1 0.8  PROT 6.9 6.8  ALBUMIN 3.0* 2.8*    CBG: Recent Labs  Lab 12/01/20 1009 12/01/20 1218 12/01/20 1642 12/01/20 2146 12/02/20 0935  GLUCAP 413* 147* 274* 223* 199*     Recent Results (from the past 240 hour(s))  SARS CORONAVIRUS 2 (TAT 6-24 HRS) Nasopharyngeal Nasopharyngeal Swab     Status: Abnormal   Collection Time: 12/01/20 10:35 AM   Specimen: Nasopharyngeal Swab  Result Value Ref Range Status   SARS Coronavirus 2 POSITIVE (A) NEGATIVE Final    Comment: (NOTE) SARS-CoV-2 target nucleic acids are DETECTED.  The SARS-CoV-2 RNA is generally detectable in upper and lower respiratory specimens during the acute phase of infection. Positive results are indicative of the presence of SARS-CoV-2 RNA. Clinical correlation with patient history and other diagnostic information is  necessary to determine patient infection status. Positive results do not rule out bacterial infection or co-infection with other viruses.  The expected result is Negative.  Fact Sheet for Patients: SugarRoll.be  Fact Sheet for Healthcare Providers: https://www.woods-mathews.com/  This test is not yet approved  or cleared by the [place] and  has been authorized for detection and/or diagnosis of SARS-CoV-2 by FDA under an Emergency Use Authorization (EUA). This EUA will remain  in effect (meaning this test can be used) for the duration of the COVID-19 declaration under Section 564(b)(1) of the Act, 21 U. S.C. section 360bbb-3(b)(1), unless the authorization is terminated or revoked sooner.   Performed at [facility], [address] [place], [place] [address]      Radiology Studies: CT Chest Wo Contrast  Result Date: 12/01/2020 CLINICAL DATA:  Decreased oxygen saturation EXAM: CT CHEST WITHOUT CONTRAST TECHNIQUE:  Multidetector CT imaging of the chest was performed following the standard protocol without IV contrast. COMPARISON:  Chest radiograph December 01, 2020 FINDINGS: Cardiovascular: There is no thoracic aortic aneurysm. There are foci of great vessel calcification. There are foci aortic atherosclerosis as well as multiple foci of coronary artery calcification. A small amount of pleural fluid is within physiologic range. No pericardial thickening evident. Mediastinum/Nodes: Thyroid is diminutive. No thyroid lesions are evident. There is an aortopulmonary window lymph node measuring 1.2 x 1.0 cm. There is a subcarinal lymph node measuring 1.3 x 1.2 cm. There are scattered subcentimeter mediastinal and axillary lymph nodes elsewhere. There is no appreciable esophageal lesion. Lungs/Pleura: There is scarring in the apices. There are scattered areas of ill-defined airspace opacity throughout the lungs bilaterally without consolidation. No pleural effusion. No pneumothorax. Trachea and major bronchial structures appear patent. Upper Abdomen: There is upper abdominal aortic atherosclerosis. Gallbladder is absent. Visualized upper abdominal structures otherwise appear unremarkable. Musculoskeletal: No blastic or lytic bone lesions. No evident chest wall lesions. IMPRESSION: 1. Multifocal airspace opacity throughout the lungs without consolidation. This appearance is felt to be indicative of atypical organism pneumonia. Correlation with COVID-19 status advised. 2. Mildly prominent aortopulmonary window and subcarinal lymph nodes likely are of reactive etiology given the underlying parenchymal lung changes. 3. Aortic atherosclerosis. Foci of great vessel and coronary artery calcification noted. 4.  Absent gallbladder. Aortic Atherosclerosis (ICD10-I70.0). Electronically Signed   By: Lowella Grip III M.D.   On: 12/01/2020 14:15   NM Pulmonary Perfusion  Result Date: 12/01/2020 CLINICAL DATA:  Shortness of breath for  months. Pulmonary embolus suspected with high probability. EXAM: NUCLEAR MEDICINE PERFUSION LUNG SCAN TECHNIQUE: Perfusion images were obtained in multiple projections after intravenous injection of radiopharmaceutical. Ventilation scans intentionally deferred if perfusion scan and chest x-ray adequate for interpretation during COVID 19 epidemic. RADIOPHARMACEUTICALS:  4.3 mCi Tc-31m MAA IV COMPARISON:  CT chest 12/01/2020.  Chest radiograph 12/01/2020. FINDINGS: Perfusion studies obtained without corresponding ventilation study. Perfusion study demonstrates normal homogeneous uptake of activity throughout both lungs. No focal perfusion defects are identified. Appearance suggest very low probability of pulmonary embolus. IMPRESSION: Normal homogeneous perfusion of both lungs. Electronically Signed   By: Lucienne Capers M.D.   On: 12/01/2020 22:04   DG Chest Port 1 View  Result Date: 12/01/2020 CLINICAL DATA:  Shortness of breath and hypoxia EXAM: PORTABLE CHEST 1 VIEW COMPARISON:  11/30/2017 FINDINGS: Borderline cardiomegaly likely accentuated by mediastinal fat. Interstitial coarsening and hazy density at the bases. No effusion, consolidation, or pneumothorax. Artifact from EKG leads. IMPRESSION: Hazy density in the lower lungs which could be atypical infection or vascular congestion. Electronically Signed   By: Monte Fantasia M.D.   On: 12/01/2020 10:30    Scheduled Meds: . amLODipine  5 mg Oral Daily  . vitamin C  500 mg Oral Daily  . divalproex  1,000 mg Oral QPM  . divalproex  250 mg Oral Daily  . gabapentin  100 mg Oral TID  . heparin  5,000 Units Subcutaneous Q8H  . insulin aspart  0-15 Units Subcutaneous TID WC  . insulin aspart  0-5 Units Subcutaneous QHS  . insulin aspart  7 Units Subcutaneous TID WC  . insulin detemir  0.2 Units/kg Subcutaneous BID  . Ipratropium-Albuterol  1 puff Inhalation Q6H  . levothyroxine  200 mcg Oral QAC breakfast  . linagliptin  5 mg Oral Daily  .  methylPREDNISolone (SOLU-MEDROL) injection  0.5 mg/kg Intravenous Q12H   Followed by  . [START ON 12/04/2020] predniSONE  50 mg Oral Daily  . mirtazapine  7.5 mg Oral QHS  . nicotine  21 mg Transdermal Daily  . pantoprazole  40 mg Oral Daily  . PARoxetine  40 mg Oral Daily  . QUEtiapine  100 mg Oral QHS  . simvastatin  40 mg Oral QHS  . sodium chloride flush  3 mL Intravenous Q12H  . zinc sulfate  220 mg Oral Daily   Continuous Infusions: . sodium chloride 75 mL/hr at 12/02/20 0935  . sodium chloride    . remdesivir 100 mg in NS 100 mL 100 mg (12/02/20 0946)     LOS: 1 day    Time spent: 30 minutes    Barton Dubois, MD [facility]   To contact the attending provider between 7A-7P or the covering provider during after hours 7P-7A, please log into the web site [email or web address] and access using universal [place] password for that web site. If you do not have the password, please call the hospital operator.  12/02/2020, 11:34 AM

## 2020-12-03 DIAGNOSIS — J9601 Acute respiratory failure with hypoxia: Secondary | ICD-10-CM

## 2020-12-03 LAB — CBC WITH DIFFERENTIAL/PLATELET
Abs Immature Granulocytes: 0.39 10*3/uL — ABNORMAL HIGH (ref 0.00–0.07)
Basophils Absolute: 0.1 10*3/uL (ref 0.0–0.1)
Basophils Relative: 0 %
Eosinophils Absolute: 0 10*3/uL (ref 0.0–0.5)
Eosinophils Relative: 0 %
HCT: 46.2 % — ABNORMAL HIGH (ref 36.0–46.0)
Hemoglobin: 15 g/dL (ref 12.0–15.0)
Immature Granulocytes: 3 %
Lymphocytes Relative: 8 %
Lymphs Abs: 1.1 10*3/uL (ref 0.7–4.0)
MCH: 30.2 pg (ref 26.0–34.0)
MCHC: 32.5 g/dL (ref 30.0–36.0)
MCV: 93.1 fL (ref 80.0–100.0)
Monocytes Absolute: 0.7 10*3/uL (ref 0.1–1.0)
Monocytes Relative: 5 %
Neutro Abs: 11.8 10*3/uL — ABNORMAL HIGH (ref 1.7–7.7)
Neutrophils Relative %: 84 %
Platelets: 266 10*3/uL (ref 150–400)
RBC: 4.96 MIL/uL (ref 3.87–5.11)
RDW: 14.5 % (ref 11.5–15.5)
WBC: 14 10*3/uL — ABNORMAL HIGH (ref 4.0–10.5)
nRBC: 0 % (ref 0.0–0.2)

## 2020-12-03 LAB — MAGNESIUM: Magnesium: 1.7 mg/dL (ref 1.7–2.4)

## 2020-12-03 LAB — PHOSPHORUS: Phosphorus: 3.2 mg/dL (ref 2.5–4.6)

## 2020-12-03 LAB — COMPREHENSIVE METABOLIC PANEL
ALT: 35 U/L (ref 0–44)
AST: 86 U/L — ABNORMAL HIGH (ref 15–41)
Albumin: 2.7 g/dL — ABNORMAL LOW (ref 3.5–5.0)
Alkaline Phosphatase: 68 U/L (ref 38–126)
Anion gap: 12 (ref 5–15)
BUN: 40 mg/dL — ABNORMAL HIGH (ref 8–23)
CO2: 26 mmol/L (ref 22–32)
Calcium: 8.9 mg/dL (ref 8.9–10.3)
Chloride: 106 mmol/L (ref 98–111)
Creatinine, Ser: 0.91 mg/dL (ref 0.44–1.00)
GFR, Estimated: >60 mL/min (ref >60)
Glucose, Bld: 111 mg/dL — ABNORMAL HIGH (ref 70–99)
Potassium: 4.1 mmol/L (ref 3.5–5.1)
Sodium: 144 mmol/L (ref 135–145)
Total Bilirubin: 0.5 mg/dL (ref 0.3–1.2)
Total Protein: 6.4 g/dL — ABNORMAL LOW (ref 6.5–8.1)

## 2020-12-03 LAB — GLUCOSE, CAPILLARY: Glucose-Capillary: 114 mg/dL — ABNORMAL HIGH (ref 70–99)

## 2020-12-03 LAB — C-REACTIVE PROTEIN: CRP: 5.6 mg/dL — ABNORMAL HIGH (ref <1.0)

## 2020-12-03 LAB — D-DIMER, QUANTITATIVE: D-Dimer, Quant: 3.22 ug/mL-FEU — ABNORMAL HIGH (ref 0.00–0.50)

## 2020-12-03 MED ORDER — GUAIFENESIN-DM 100-10 MG/5ML PO SYRP: 10.0000 mL | ORAL_SOLUTION | ORAL | 0 refills | Status: DC | PRN

## 2020-12-03 MED ORDER — AMLODIPINE BESYLATE 5 MG PO TABS: 5.0000 mg | ORAL_TABLET | Freq: Every day | ORAL | 1 refills | Status: DC

## 2020-12-03 MED ORDER — ALBUTEROL SULFATE HFA 108 (90 BASE) MCG/ACT IN AERS: 2.0000 | INHALATION_SPRAY | RESPIRATORY_TRACT | 1 refills | Status: DC | PRN

## 2020-12-03 MED ORDER — ASCORBIC ACID 500 MG PO TABS: 500.0000 mg | ORAL_TABLET | Freq: Every day | ORAL | 0 refills | Status: AC

## 2020-12-03 MED ORDER — PREDNISONE 50 MG PO TABS: 50.0000 mg | ORAL_TABLET | Freq: Every day | ORAL | 0 refills | Status: DC

## 2020-12-03 MED ORDER — ZINC SULFATE 220 (50 ZN) MG PO CAPS: 220.0000 mg | ORAL_CAPSULE | Freq: Every day | ORAL | 0 refills | Status: AC

## 2020-12-03 NOTE — Progress Notes (Signed)
Discharge instructions provided to patient. Patient verbalized understanding of all discharge information and had no further questions. Waiting for ride to arrive.

## 2020-12-03 NOTE — Discharge Summary (Signed)
Physician Discharge Summary  Claudia Keller [id number] DOB: 1951-07-31 DOA: 12/01/2020  PCP: Rosita Fire, MD  Admit date: 12/01/2020  Discharge date: 12/03/2020  Admitted From:Home  Disposition:  Home  Recommendations for Outpatient Follow-up:  1. Follow up with PCP in 1-2 weeks, needs follow-up for hemoglobin A1c of 10.2% and may need endocrinology referral 2. Continue on usual home medications and steroids as noted below  Home Health: None  Equipment/Devices: None  Discharge Condition:Stable  CODE STATUS: Full  Diet recommendation: Heart Healthy/carb modified  Brief/Interim Summary: Claudia T Powellis a 70 y.o.femalewith medical history significant ofCOPD, morbid obesity, hypertension, hypothyroidism, hyperlipidemia, type 2 diabetes mellitus with nephropathy and neuropathy, tobacco abuse and depression/anxiety; presented to the hospital secondary to shortness of breath nonproductive cough, general malaise.Patient reports symptoms have been present for the last 3 to 4 days and worsening in the last 48 hours prior to admission. EMS was called and on arrival to her house patient was found hypoglycemic with CBGs 453 and also hypoxic with oxygen saturation of 80% on room air.Patient denies fever, chills, chest pain, nausea, vomiting, dysuria, hematuria, melena, hematochezia, focal weakness or sick contacts. Patient reported going out on her home inhalers and felt that she couldn't make it any longer without assistance.  Of note, patient is not vaccinated against Covid.  ED Course:Reporting shortness of breath, dyspnea on exertion, nonproductive coughing spells and general malaise. Chest images demonstrating vascular congestion and multifocal infiltrates characteristic of atypical infection (COVID-19);inflammatory markers with D-dimer of 10.27, ferritin 203, LDH 230, CRP 19.Patient started empirically on a steroids, bronchodilators, oxygen supplementation and remdesivir.  Covid test pending.  -Patient did have positive Covid test and was treated for acute hypoxemic respiratory failure secondary to Covid pneumonia and was also noted to have some uncontrolled type 2 diabetes with A1c of 10.2%.  She will have weaned her steroid dosage prior to going home and will only require this for 5 more days.  She is also noted to have some AKI on CKD stage IIIb which has now resolved as well.  She is in stable condition for discharge and does not require any home oxygen.  She has completed 3 days of remdesivir and is stable for discharge today.  Discharge Diagnoses:  Active Problems:   Acute respiratory failure with hypoxia (HCC)  Principal discharge diagnosis: Acute hypoxemic respiratory failure secondary to Covid pneumonia.  Discharge Instructions  Discharge Instructions    Diet - low sodium heart healthy   Complete by: As directed    Increase activity slowly   Complete by: As directed      Allergies as of 12/03/2020   No Known Allergies     Medication List    TAKE these medications   albuterol 108 (90 Base) MCG/ACT inhaler Commonly known as: VENTOLIN HFA Inhale 2 puffs into the lungs every 4 (four) hours as needed for wheezing or shortness of breath. What changed:   how much to take  when to take this   amLODipine 5 MG tablet Commonly known as: NORVASC Take 1 tablet (5 mg total) by mouth daily. What changed: Another medication with the same name was removed. Continue taking this medication, and follow the directions you see here.   ascorbic acid 500 MG tablet Commonly known as: VITAMIN C Take 1 tablet (500 mg total) by mouth daily for 15 days. Start taking on: December 04, 2020   cetirizine 10 MG tablet Commonly known as: ZYRTEC Take 10 mg by mouth daily.   colestipol 1  g tablet Commonly known as: COLESTID TAKE 2 TABLETS DAILY FOR DIARRHEA. DO NOT TAKE WITHIN 2 HOURS OF OTHER ORAL MEDICATIONS. HOLD FOR CONSTIPATION.   divalproex 500 MG 24 hr  tablet Commonly known as: DEPAKOTE ER Take 2 tablets (1,000 mg total) by mouth every evening.   divalproex 250 MG DR tablet Commonly known as: DEPAKOTE Take 1 tablet (250 mg total) by mouth daily.   furosemide 20 MG tablet Commonly known as: LASIX Take 20 mg by mouth daily.   gabapentin 100 MG capsule Commonly known as: NEURONTIN Take 100 mg by mouth 3 (three) times daily.   guaiFENesin-dextromethorphan 100-10 MG/5ML syrup Commonly known as: ROBITUSSIN DM Take 10 mLs by mouth every 4 (four) hours as needed for cough.   insulin glargine 100 UNIT/ML injection Commonly known as: LANTUS Inject 0.45 mLs (45 Units total) into the skin 2 (two) times daily. What changed:   how much to take  when to take this   insulin lispro 100 UNIT/ML injection Commonly known as: HUMALOG Inject 15-24 Units into the skin 3 (three) times daily with meals. Per sliding scale. 151-200=18 units; 201-250= 9 units; 251-300=20 units; 301-350=21 units; 351-400= 22 units; 401-450=23 units; 500-551=24 units.   levothyroxine 200 MCG tablet Commonly known as: SYNTHROID Take 200 mcg by mouth daily before breakfast.   linagliptin 5 MG Tabs tablet Commonly known as: TRADJENTA Take 5 mg by mouth daily.   lisinopril 20 MG tablet Commonly known as: ZESTRIL Take 20 mg by mouth daily.   metFORMIN 500 MG tablet Commonly known as: GLUCOPHAGE Take 500 mg by mouth 2 (two) times daily.   mirtazapine 7.5 MG tablet Commonly known as: REMERON Take 1 tablet (7.5 mg total) by mouth at bedtime.   nicotine 21 mg/24hr patch Commonly known as: NICODERM CQ - dosed in mg/24 hours Place 1 patch (21 mg total) onto the skin daily.   NovoLOG FlexPen 100 UNIT/ML FlexPen Generic drug: insulin aspart Inject 15 Units into the skin in the morning, at noon, and at bedtime.   ondansetron 4 MG tablet Commonly known as: ZOFRAN Take 1 tablet (4 mg total) by mouth every 8 (eight) hours as needed for nausea or vomiting.    Oyster Shell Calcium w/D 500-200 MG-UNIT Tabs Take 1 tablet by mouth 2 (two) times daily.   pantoprazole 40 MG tablet Commonly known as: PROTONIX TAKE 1 TABLET BY MOUTH ONCE DAILY 30 MINTUES BEFORE BREAKFAST. What changed:   how much to take  how to take this  when to take this  additional instructions   PARoxetine 40 MG tablet Commonly known as: PAXIL Take 1 tablet (40 mg total) by mouth daily.   predniSONE 50 MG tablet Commonly known as: DELTASONE Take 1 tablet (50 mg total) by mouth daily for 5 days. Start taking on: December 04, 2020   QUEtiapine 100 MG tablet Commonly known as: SEROQUEL Take 1 tablet (100 mg total) by mouth at bedtime.   simvastatin 40 MG tablet Commonly known as: ZOCOR Take 40 mg by mouth at bedtime.   zinc sulfate 220 (50 Zn) MG capsule Take 1 capsule (220 mg total) by mouth daily for 15 days. Start taking on: December 04, 2020       Follow-up Information    Rosita Fire, MD Follow up in 1 week(s).   Specialty: Internal Medicine Contact information: [place] [phone]              No Known Allergies  Consultations:  None   Procedures/Studies: CT Chest Wo Contrast  Result Date: 12/01/2020 CLINICAL DATA:  Decreased oxygen saturation EXAM: CT CHEST WITHOUT CONTRAST TECHNIQUE: Multidetector CT imaging of the chest was performed following the standard protocol without IV contrast. COMPARISON:  Chest radiograph December 01, 2020 FINDINGS: Cardiovascular: There is no thoracic aortic aneurysm. There are foci of great vessel calcification. There are foci aortic atherosclerosis as well as multiple foci of coronary artery calcification. A small amount of pleural fluid is within physiologic range. No pericardial thickening evident. Mediastinum/Nodes: Thyroid is diminutive. No thyroid lesions are evident. There is an aortopulmonary window lymph node measuring 1.2 x 1.0 cm. There is a subcarinal lymph  node measuring 1.3 x 1.2 cm. There are scattered subcentimeter mediastinal and axillary lymph nodes elsewhere. There is no appreciable esophageal lesion. Lungs/Pleura: There is scarring in the apices. There are scattered areas of ill-defined airspace opacity throughout the lungs bilaterally without consolidation. No pleural effusion. No pneumothorax. Trachea and major bronchial structures appear patent. Upper Abdomen: There is upper abdominal aortic atherosclerosis. Gallbladder is absent. Visualized upper abdominal structures otherwise appear unremarkable. Musculoskeletal: No blastic or lytic bone lesions. No evident chest wall lesions. IMPRESSION: 1. Multifocal airspace opacity throughout the lungs without consolidation. This appearance is felt to be indicative of atypical organism pneumonia. Correlation with COVID-19 status advised. 2. Mildly prominent aortopulmonary window and subcarinal lymph nodes likely are of reactive etiology given the underlying parenchymal lung changes. 3. Aortic atherosclerosis. Foci of great vessel and coronary artery calcification noted. 4.  Absent gallbladder. Aortic Atherosclerosis (ICD10-I70.0). Electronically Signed   By: Lowella Grip III M.D.   On: 12/01/2020 14:15   NM Pulmonary Perfusion  Result Date: 12/01/2020 CLINICAL DATA:  Shortness of breath for months. Pulmonary embolus suspected with high probability. EXAM: NUCLEAR MEDICINE PERFUSION LUNG SCAN TECHNIQUE: Perfusion images were obtained in multiple projections after intravenous injection of radiopharmaceutical. Ventilation scans intentionally deferred if perfusion scan and chest x-ray adequate for interpretation during COVID 19 epidemic. RADIOPHARMACEUTICALS:  4.3 mCi Tc-52m MAA IV COMPARISON:  CT chest 12/01/2020.  Chest radiograph 12/01/2020. FINDINGS: Perfusion studies obtained without corresponding ventilation study. Perfusion study demonstrates normal homogeneous uptake of activity throughout both lungs. No  focal perfusion defects are identified. Appearance suggest very low probability of pulmonary embolus. IMPRESSION: Normal homogeneous perfusion of both lungs. Electronically Signed   By: Lucienne Capers M.D.   On: 12/01/2020 22:04   DG Chest Port 1 View  Result Date: 12/01/2020 CLINICAL DATA:  Shortness of breath and hypoxia EXAM: PORTABLE CHEST 1 VIEW COMPARISON:  11/30/2017 FINDINGS: Borderline cardiomegaly likely accentuated by mediastinal fat. Interstitial coarsening and hazy density at the bases. No effusion, consolidation, or pneumothorax. Artifact from EKG leads. IMPRESSION: Hazy density in the lower lungs which could be atypical infection or vascular congestion. Electronically Signed   By: Monte Fantasia M.D.   On: 12/01/2020 10:30      Discharge Exam: Vitals:   12/03/20 0530 12/03/20 0800  BP:    Pulse: 85   Resp:    Temp:    SpO2:  92%   Vitals:   12/03/20 0158 12/03/20 0528 12/03/20 0530 12/03/20 0800  BP: 136/76 (!) 119/93    Pulse: 81 (!) 118 85   Resp: 20 20    Temp: 98 F (36.7 C) 98 F (36.7 C)    TempSrc:      SpO2: 95% 96%  92%  Weight:      Height:        General: Pt  is alert, awake, not in acute distress Cardiovascular: RRR, S1/S2 +, no rubs, no gallops Respiratory: CTA bilaterally, no wheezing, no rhonchi Abdominal: Soft, NT, ND, bowel sounds + Extremities: no edema, no cyanosis    The results of significant diagnostics from this hospitalization (including imaging, microbiology, ancillary and laboratory) are listed below for reference.     Microbiology: Recent Results (from the past 240 hour(s))  SARS CORONAVIRUS 2 (TAT 6-24 HRS) Nasopharyngeal Nasopharyngeal Swab     Status: Abnormal   Collection Time: 12/01/20 10:35 AM   Specimen: Nasopharyngeal Swab  Result Value Ref Range Status   SARS Coronavirus 2 POSITIVE (A) NEGATIVE Final    Comment: (NOTE) SARS-CoV-2 target nucleic acids are DETECTED.  The SARS-CoV-2 RNA is generally detectable in  upper and lower respiratory specimens during the acute phase of infection. Positive results are indicative of the presence of SARS-CoV-2 RNA. Clinical correlation with patient history and other diagnostic information is  necessary to determine patient infection status. Positive results do not rule out bacterial infection or co-infection with other viruses.  The expected result is Negative.  Fact Sheet for Patients: SugarRoll.be  Fact Sheet for Healthcare Providers: https://www.woods-mathews.com/  This test is not yet approved or cleared by the [place] FDA and  has been authorized for detection and/or diagnosis of SARS-CoV-2 by FDA under an Emergency Use Authorization (EUA). This EUA will remain  in effect (meaning this test can be used) for the duration of the COVID-19 declaration under Section 564(b)(1) of the Act, 21 U. S.C. section 360bbb-3(b)(1), unless the authorization is terminated or revoked sooner.   Performed at [facility], [place] [address] [place], [place] [address]      Labs: BNP (last 3 results) Recent Labs    12/01/20 1051  BNP [id number]*   Basic Metabolic Panel: Recent Labs  Lab 12/01/20 1013 12/01/20 1612 12/02/20 0710 12/03/20 0542  NA 135  --  142 144  K 4.5  --  5.0 4.1  CL 96*  --  103 106  CO2 25  --  26 26  GLUCOSE 399*  --  200* 111*  BUN 35*  --  38* 40*  CREATININE 1.64* 1.35* 1.37* 0.91  CALCIUM 10.1  --  9.5 8.9  MG  --   --  1.6* 1.7  PHOS  --   --  3.7 3.2   Liver Function Tests: Recent Labs  Lab 12/01/20 1013 12/02/20 0710 12/03/20 0542  AST 16 79* 86*  ALT 12 28 35  ALKPHOS 80 71 68  BILITOT 1.1 0.8 0.5  PROT 6.9 6.8 6.4*  ALBUMIN 3.0* 2.8* 2.7*   No results for input(s): LIPASE, AMYLASE in the last 168 hours. No results for input(s): AMMONIA in the last 168 hours. CBC: Recent Labs  Lab 12/01/20 1013 12/01/20 1612 12/02/20 0710 12/03/20 0542  WBC 9.7 9.3 7.9 14.0*   NEUTROABS 7.8*  --  6.2 11.8*  HGB 13.8 14.1 14.4 15.0  HCT 42.0 43.9 44.5 46.2*  MCV 91.9 94.2 92.5 93.1  PLT 228 227 244 266   Cardiac Enzymes: No results for input(s): CKTOTAL, CKMB, CKMBINDEX, TROPONINI in the last 168 hours. BNP: Invalid input(s): POCBNP CBG: Recent Labs  Lab 12/02/20 0935 12/02/20 1157 12/02/20 1635 12/02/20 2202 12/03/20 0832  GLUCAP 199* 248* 158* 157* 114*   D-Dimer Recent Labs    12/02/20 0710 12/03/20 0542  DDIMER 10.54* 3.22*   Hgb A1c Recent Labs    12/01/20 1612  HGBA1C 10.2*  Lipid Profile No results for input(s): CHOL, HDL, LDLCALC, TRIG, CHOLHDL, LDLDIRECT in the last 72 hours. Thyroid function studies No results for input(s): TSH, T4TOTAL, T3FREE, THYROIDAB in the last 72 hours.  Invalid input(s): FREET3 Anemia work up Recent Labs    12/01/20 1456  FERRITIN 203   Urinalysis    Component Value Date/Time   COLORURINE YELLOW 03/18/2020 [place] 03/18/2020 1217   LABSPEC 1.010 03/18/2020 1217   PHURINE 7.0 03/18/2020 1217   GLUCOSEU NEGATIVE 03/18/2020 1217   HGBUR SMALL (A) 03/18/2020 1217   BILIRUBINUR NEGATIVE 03/18/2020 1217   [place] 03/18/2020 1217   PROTEINUR NEGATIVE 03/18/2020 1217   UROBILINOGEN 1.0 01/10/2015 1754   NITRITE POSITIVE (A) 03/18/2020 1217   LEUKOCYTESUR SMALL (A) 03/18/2020 1217   Sepsis Labs Invalid input(s): PROCALCITONIN,  WBC,  LACTICIDVEN Microbiology Recent Results (from the past 240 hour(s))  SARS CORONAVIRUS 2 (TAT 6-24 HRS) Nasopharyngeal Nasopharyngeal Swab     Status: Abnormal   Collection Time: 12/01/20 10:35 AM   Specimen: Nasopharyngeal Swab  Result Value Ref Range Status   SARS Coronavirus 2 POSITIVE (A) NEGATIVE Final    Comment: (NOTE) SARS-CoV-2 target nucleic acids are DETECTED.  The SARS-CoV-2 RNA is generally detectable in upper and lower respiratory specimens during the acute phase of infection. Positive results are indicative of the  presence of SARS-CoV-2 RNA. Clinical correlation with patient history and other diagnostic information is  necessary to determine patient infection status. Positive results do not rule out bacterial infection or co-infection with other viruses.  The expected result is Negative.  Fact Sheet for Patients: SugarRoll.be  Fact Sheet for Healthcare Providers: https://www.woods-mathews.com/  This test is not yet approved or cleared by the [place] FDA and  has been authorized for detection and/or diagnosis of SARS-CoV-2 by FDA under an Emergency Use Authorization (EUA). This EUA will remain  in effect (meaning this test can be used) for the duration of the COVID-19 declaration under Section 564(b)(1) of the Act, 21 U. S.C. section 360bbb-3(b)(1), unless the authorization is terminated or revoked sooner.   Performed at [facility], [place] [address] [place], [place] [address]      Time coordinating discharge: 35 minutes  SIGNED:   Rodena Goldmann, DO [facility] 12/03/2020, 10:37 AM  If 7PM-7AM, please contact night-coverage [email or web address]

## 2020-12-03 NOTE — Progress Notes (Signed)
Patient ambulated on room air, maintained o2 sats 91-93%, no c/o SOB. Dr. Manuella Ghazi made aware.

## 2020-12-04 DIAGNOSIS — T1490XA Injury, unspecified, initial encounter: Secondary | ICD-10-CM | POA: Diagnosis not present

## 2020-12-04 DIAGNOSIS — W19XXXA Unspecified fall, initial encounter: Secondary | ICD-10-CM | POA: Diagnosis not present

## 2020-12-05 ENCOUNTER — Other Ambulatory Visit: Payer: Self-pay | Admitting: *Deleted

## 2020-12-05 ENCOUNTER — Other Ambulatory Visit: Payer: Self-pay | Admitting: General Practice

## 2020-12-05 DIAGNOSIS — IMO0002 Reserved for concepts with insufficient information to code with codable children: Secondary | ICD-10-CM

## 2020-12-05 DIAGNOSIS — E1122 Type 2 diabetes mellitus with diabetic chronic kidney disease: Secondary | ICD-10-CM

## 2020-12-05 NOTE — Patient Outreach (Signed)
[facility]) Care Management  12/05/2020  Claudia Keller 02-17-1951 [id number]  Initial telephone outreach PAC for COVID. She did have Moderna vaccinations 1 & 2. Med Hx includes COPD (current smoker), HTN, DM,  Hyperlipidemia, hypothyroidism, Bipolar disorder, CKD, PTSD, Obesity  Spoke with Ms. Hillesheim this evening. She gave permission for [facility] transition of care call.  Transition of care assessment completed.  Patient was recently discharged from hospital and all medications have been reviewed. Outpatient Encounter Medications as of 12/05/2020  Medication Sig Note  . albuterol (VENTOLIN HFA) 108 (90 Base) MCG/ACT inhaler Inhale 2 puffs into the lungs every 4 (four) hours as needed for wheezing or shortness of breath.   Marland Kitchen amLODipine (NORVASC) 5 MG tablet Take 1 tablet (5 mg total) by mouth daily.   Marland Kitchen ascorbic acid (VITAMIN C) 500 MG tablet Take 1 tablet (500 mg total) by mouth daily for 15 days.   . Calcium Carb-Cholecalciferol (OYSTER SHELL CALCIUM W/D) 500-200 MG-UNIT TABS Take 1 tablet by mouth 2 (two) times daily.   . cetirizine (ZYRTEC) 10 MG tablet Take 10 mg by mouth daily.   . colestipol (COLESTID) 1 g tablet TAKE 2 TABLETS DAILY FOR DIARRHEA. DO NOT TAKE WITHIN 2 HOURS OF OTHER ORAL MEDICATIONS. HOLD FOR CONSTIPATION.   . divalproex (DEPAKOTE ER) 500 MG 24 hr tablet Take 2 tablets (1,000 mg total) by mouth every evening.   . divalproex (DEPAKOTE) 250 MG DR tablet Take 1 tablet (250 mg total) by mouth daily.   . furosemide (LASIX) 20 MG tablet Take 20 mg by mouth daily.   Marland Kitchen gabapentin (NEURONTIN) 100 MG capsule Take 100 mg by mouth 3 (three) times daily.   Marland Kitchen guaiFENesin-dextromethorphan (ROBITUSSIN DM) 100-10 MG/5ML syrup Take 10 mLs by mouth every 4 (four) hours as needed for cough.   . insulin glargine (LANTUS) 100 UNIT/ML injection Inject 0.45 mLs (45 Units total) into the skin 2 (two) times daily. (Patient taking differently: Inject 100 Units into  the skin at bedtime.)   . insulin lispro (HUMALOG) 100 UNIT/ML injection Inject 15-24 Units into the skin 3 (three) times daily with meals. Per sliding scale. 151-200=18 units; 201-250= 9 units; 251-300=20 units; 301-350=21 units; 351-400= 22 units; 401-450=23 units; 500-551=24 units. 12/01/2020: LF: 06/09/2020 DS:28   . levothyroxine (SYNTHROID) 200 MCG tablet Take 200 mcg by mouth daily before breakfast.    . linagliptin (TRADJENTA) 5 MG TABS tablet Take 5 mg by mouth daily.    Marland Kitchen lisinopril (PRINIVIL,ZESTRIL) 20 MG tablet Take 20 mg by mouth daily.   . metFORMIN (GLUCOPHAGE) 500 MG tablet Take 500 mg by mouth 2 (two) times daily.   . mirtazapine (REMERON) 7.5 MG tablet Take 1 tablet (7.5 mg total) by mouth at bedtime.   Marland Kitchen NOVOLOG FLEXPEN 100 UNIT/ML FlexPen Inject 15 Units into the skin in the morning, at noon, and at bedtime.   . ondansetron (ZOFRAN) 4 MG tablet Take 1 tablet (4 mg total) by mouth every 8 (eight) hours as needed for nausea or vomiting.   . pantoprazole (PROTONIX) 40 MG tablet TAKE 1 TABLET BY MOUTH ONCE DAILY 30 MINTUES BEFORE BREAKFAST. (Patient taking differently: Take 40 mg by mouth daily before breakfast.)   . PARoxetine (PAXIL) 40 MG tablet Take 1 tablet (40 mg total) by mouth daily.   . predniSONE (DELTASONE) 50 MG tablet Take 1 tablet (50 mg total) by mouth daily for 5 days.   Marland Kitchen QUEtiapine (SEROQUEL) 100 MG tablet Take 1 tablet (100  mg total) by mouth at bedtime.   . simvastatin (ZOCOR) 40 MG tablet Take 40 mg by mouth at bedtime.    Marland Kitchen zinc sulfate 220 (50 Zn) MG capsule Take 1 capsule (220 mg total) by mouth daily for 15 days.   . nicotine (NICODERM CQ - DOSED IN MG/24 HOURS) 21 mg/24hr patch Place 1 patch (21 mg total) onto the skin daily. (Patient not taking: No sig reported)    No facility-administered encounter medications on file as of 12/05/2020.   She does not have an appt to see Dr. Legrand Rams yet. She will call next week. She will not be out of quarantine  until next  week. NP to call her on Monday and ensure she calls to make this appt.  A great barrier for her getting the personal care she needs is she is fighting bed bugs in her home. She reports the exterminator has been in 5 times but they have not cleared her apartment. She aid is not allowed to come until this is resolved. DSS and the apartment complex are working together to remedy this.  Encouraged her to use her albuterol if she becomes SOB, use Tussin DM for cough and APAP for pain or fever. Call NP or MD office if she needs to talk to a provider. Call 911 if she becomes SOB with distress.  We agreed to talk again on Monday.  Eulah Pont. Myrtie Neither, MSN, [facility] Gerontological Nurse Practitioner [facility] Care Management [phone]

## 2020-12-06 ENCOUNTER — Other Ambulatory Visit: Payer: Self-pay

## 2020-12-06 ENCOUNTER — Inpatient Hospital Stay (HOSPITAL_COMMUNITY)
Admission: EM | Admit: 2020-12-06 | Discharge: 2020-12-12 | DRG: 177 | Disposition: A | Discharge status: Skilled Nursing Facility | Payer: Medicare HMO | Attending: Internal Medicine | Admitting: Internal Medicine

## 2020-12-06 ENCOUNTER — Encounter (HOSPITAL_COMMUNITY): Payer: Self-pay | Admitting: Emergency Medicine

## 2020-12-06 DIAGNOSIS — J1282 Pneumonia due to coronavirus disease 2019: Secondary | ICD-10-CM | POA: Diagnosis not present

## 2020-12-06 DIAGNOSIS — S92902A Unspecified fracture of left foot, initial encounter for closed fracture: Secondary | ICD-10-CM | POA: Diagnosis not present

## 2020-12-06 DIAGNOSIS — R0902 Hypoxemia: Secondary | ICD-10-CM

## 2020-12-06 DIAGNOSIS — F4310 Post-traumatic stress disorder, unspecified: Secondary | ICD-10-CM | POA: Diagnosis present

## 2020-12-06 DIAGNOSIS — U071 COVID-19: Secondary | ICD-10-CM | POA: Diagnosis not present

## 2020-12-06 DIAGNOSIS — F17210 Nicotine dependence, cigarettes, uncomplicated: Secondary | ICD-10-CM | POA: Diagnosis present

## 2020-12-06 DIAGNOSIS — J069 Acute upper respiratory infection, unspecified: Secondary | ICD-10-CM | POA: Diagnosis present

## 2020-12-06 DIAGNOSIS — F319 Bipolar disorder, unspecified: Secondary | ICD-10-CM | POA: Diagnosis present

## 2020-12-06 DIAGNOSIS — I10 Essential (primary) hypertension: Secondary | ICD-10-CM | POA: Diagnosis present

## 2020-12-06 DIAGNOSIS — J449 Chronic obstructive pulmonary disease, unspecified: Secondary | ICD-10-CM

## 2020-12-06 DIAGNOSIS — Z79899 Other long term (current) drug therapy: Secondary | ICD-10-CM

## 2020-12-06 DIAGNOSIS — S92309A Fracture of unspecified metatarsal bone(s), unspecified foot, initial encounter for closed fracture: Secondary | ICD-10-CM | POA: Diagnosis present

## 2020-12-06 DIAGNOSIS — K219 Gastro-esophageal reflux disease without esophagitis: Secondary | ICD-10-CM

## 2020-12-06 DIAGNOSIS — E8809 Other disorders of plasma-protein metabolism, not elsewhere classified: Secondary | ICD-10-CM

## 2020-12-06 DIAGNOSIS — S4292XA Fracture of left shoulder girdle, part unspecified, initial encounter for closed fracture: Secondary | ICD-10-CM | POA: Diagnosis not present

## 2020-12-06 DIAGNOSIS — R6889 Other general symptoms and signs: Secondary | ICD-10-CM | POA: Diagnosis not present

## 2020-12-06 DIAGNOSIS — K2100 Gastro-esophageal reflux disease with esophagitis, without bleeding: Secondary | ICD-10-CM

## 2020-12-06 DIAGNOSIS — Z743 Need for continuous supervision: Secondary | ICD-10-CM | POA: Diagnosis not present

## 2020-12-06 DIAGNOSIS — E039 Hypothyroidism, unspecified: Secondary | ICD-10-CM | POA: Diagnosis present

## 2020-12-06 DIAGNOSIS — N179 Acute kidney failure, unspecified: Secondary | ICD-10-CM | POA: Diagnosis present

## 2020-12-06 DIAGNOSIS — E782 Mixed hyperlipidemia: Secondary | ICD-10-CM | POA: Diagnosis present

## 2020-12-06 DIAGNOSIS — R739 Hyperglycemia, unspecified: Secondary | ICD-10-CM | POA: Diagnosis not present

## 2020-12-06 DIAGNOSIS — E669 Obesity, unspecified: Secondary | ICD-10-CM

## 2020-12-06 DIAGNOSIS — E1165 Type 2 diabetes mellitus with hyperglycemia: Secondary | ICD-10-CM

## 2020-12-06 DIAGNOSIS — Z79890 Hormone replacement therapy (postmenopausal): Secondary | ICD-10-CM

## 2020-12-06 DIAGNOSIS — Z794 Long term (current) use of insulin: Secondary | ICD-10-CM

## 2020-12-06 DIAGNOSIS — E440 Moderate protein-calorie malnutrition: Secondary | ICD-10-CM | POA: Diagnosis present

## 2020-12-06 DIAGNOSIS — R7401 Elevation of levels of liver transaminase levels: Secondary | ICD-10-CM

## 2020-12-06 DIAGNOSIS — F419 Anxiety disorder, unspecified: Secondary | ICD-10-CM

## 2020-12-06 DIAGNOSIS — E1140 Type 2 diabetes mellitus with diabetic neuropathy, unspecified: Secondary | ICD-10-CM

## 2020-12-06 DIAGNOSIS — W1789XA Other fall from one level to another, initial encounter: Secondary | ICD-10-CM | POA: Diagnosis present

## 2020-12-06 DIAGNOSIS — D72829 Elevated white blood cell count, unspecified: Secondary | ICD-10-CM

## 2020-12-06 DIAGNOSIS — Z9114 Patient's other noncompliance with medication regimen: Secondary | ICD-10-CM

## 2020-12-06 DIAGNOSIS — E7800 Pure hypercholesterolemia, unspecified: Secondary | ICD-10-CM | POA: Diagnosis present

## 2020-12-06 DIAGNOSIS — Z720 Tobacco use: Secondary | ICD-10-CM | POA: Diagnosis present

## 2020-12-06 DIAGNOSIS — I4891 Unspecified atrial fibrillation: Secondary | ICD-10-CM | POA: Diagnosis present

## 2020-12-06 DIAGNOSIS — R9431 Abnormal electrocardiogram [ECG] [EKG]: Secondary | ICD-10-CM | POA: Diagnosis not present

## 2020-12-06 DIAGNOSIS — J440 Chronic obstructive pulmonary disease with acute lower respiratory infection: Secondary | ICD-10-CM | POA: Diagnosis present

## 2020-12-06 DIAGNOSIS — S92512A Displaced fracture of proximal phalanx of left lesser toe(s), initial encounter for closed fracture: Secondary | ICD-10-CM | POA: Diagnosis not present

## 2020-12-06 DIAGNOSIS — S92912A Unspecified fracture of left toe(s), initial encounter for closed fracture: Secondary | ICD-10-CM

## 2020-12-06 DIAGNOSIS — T380X5A Adverse effect of glucocorticoids and synthetic analogues, initial encounter: Secondary | ICD-10-CM | POA: Diagnosis present

## 2020-12-06 DIAGNOSIS — Z6839 Body mass index (BMI) 39.0-39.9, adult: Secondary | ICD-10-CM

## 2020-12-06 DIAGNOSIS — J9601 Acute respiratory failure with hypoxia: Secondary | ICD-10-CM | POA: Diagnosis present

## 2020-12-06 DIAGNOSIS — R0602 Shortness of breath: Secondary | ICD-10-CM | POA: Diagnosis not present

## 2020-12-06 DIAGNOSIS — E1121 Type 2 diabetes mellitus with diabetic nephropathy: Secondary | ICD-10-CM | POA: Diagnosis present

## 2020-12-06 DIAGNOSIS — Z91148 Patient's other noncompliance with medication regimen for other reason: Secondary | ICD-10-CM

## 2020-12-06 DIAGNOSIS — F32A Depression, unspecified: Secondary | ICD-10-CM

## 2020-12-06 NOTE — ED Triage Notes (Signed)
[facility] - pt comes from home with CBG of 526 and 82% on RA. Pt placed on NRB by EMS, O2 increased to 99%. Pt c/o left foot pain

## 2020-12-07 ENCOUNTER — Inpatient Hospital Stay (HOSPITAL_COMMUNITY): Payer: Medicare HMO

## 2020-12-07 ENCOUNTER — Emergency Department (HOSPITAL_COMMUNITY): Payer: Medicare HMO

## 2020-12-07 DIAGNOSIS — W1789XA Other fall from one level to another, initial encounter: Secondary | ICD-10-CM | POA: Diagnosis not present

## 2020-12-07 DIAGNOSIS — E440 Moderate protein-calorie malnutrition: Secondary | ICD-10-CM | POA: Diagnosis not present

## 2020-12-07 DIAGNOSIS — Z794 Long term (current) use of insulin: Secondary | ICD-10-CM | POA: Diagnosis not present

## 2020-12-07 DIAGNOSIS — F32A Depression, unspecified: Secondary | ICD-10-CM

## 2020-12-07 DIAGNOSIS — E1165 Type 2 diabetes mellitus with hyperglycemia: Secondary | ICD-10-CM

## 2020-12-07 DIAGNOSIS — K219 Gastro-esophageal reflux disease without esophagitis: Secondary | ICD-10-CM

## 2020-12-07 DIAGNOSIS — U071 COVID-19: Principal | ICD-10-CM | POA: Diagnosis present

## 2020-12-07 DIAGNOSIS — Z9114 Patient's other noncompliance with medication regimen: Secondary | ICD-10-CM

## 2020-12-07 DIAGNOSIS — E669 Obesity, unspecified: Secondary | ICD-10-CM

## 2020-12-07 DIAGNOSIS — E7800 Pure hypercholesterolemia, unspecified: Secondary | ICD-10-CM | POA: Diagnosis not present

## 2020-12-07 DIAGNOSIS — E8809 Other disorders of plasma-protein metabolism, not elsewhere classified: Secondary | ICD-10-CM

## 2020-12-07 DIAGNOSIS — D72829 Elevated white blood cell count, unspecified: Secondary | ICD-10-CM

## 2020-12-07 DIAGNOSIS — J069 Acute upper respiratory infection, unspecified: Secondary | ICD-10-CM | POA: Diagnosis present

## 2020-12-07 DIAGNOSIS — S92512A Displaced fracture of proximal phalanx of left lesser toe(s), initial encounter for closed fracture: Secondary | ICD-10-CM | POA: Diagnosis not present

## 2020-12-07 DIAGNOSIS — E1121 Type 2 diabetes mellitus with diabetic nephropathy: Secondary | ICD-10-CM | POA: Diagnosis not present

## 2020-12-07 DIAGNOSIS — Z6839 Body mass index (BMI) 39.0-39.9, adult: Secondary | ICD-10-CM | POA: Diagnosis not present

## 2020-12-07 DIAGNOSIS — F419 Anxiety disorder, unspecified: Secondary | ICD-10-CM

## 2020-12-07 DIAGNOSIS — S92302A Fracture of unspecified metatarsal bone(s), left foot, initial encounter for closed fracture: Secondary | ICD-10-CM

## 2020-12-07 DIAGNOSIS — E782 Mixed hyperlipidemia: Secondary | ICD-10-CM | POA: Diagnosis present

## 2020-12-07 DIAGNOSIS — F4310 Post-traumatic stress disorder, unspecified: Secondary | ICD-10-CM | POA: Diagnosis present

## 2020-12-07 DIAGNOSIS — R7401 Elevation of levels of liver transaminase levels: Secondary | ICD-10-CM | POA: Diagnosis not present

## 2020-12-07 DIAGNOSIS — J1282 Pneumonia due to coronavirus disease 2019: Secondary | ICD-10-CM | POA: Diagnosis present

## 2020-12-07 DIAGNOSIS — J440 Chronic obstructive pulmonary disease with acute lower respiratory infection: Secondary | ICD-10-CM | POA: Diagnosis not present

## 2020-12-07 DIAGNOSIS — R9431 Abnormal electrocardiogram [ECG] [EKG]: Secondary | ICD-10-CM

## 2020-12-07 DIAGNOSIS — J449 Chronic obstructive pulmonary disease, unspecified: Secondary | ICD-10-CM

## 2020-12-07 DIAGNOSIS — E039 Hypothyroidism, unspecified: Secondary | ICD-10-CM

## 2020-12-07 DIAGNOSIS — Z720 Tobacco use: Secondary | ICD-10-CM

## 2020-12-07 DIAGNOSIS — F17210 Nicotine dependence, cigarettes, uncomplicated: Secondary | ICD-10-CM | POA: Diagnosis present

## 2020-12-07 DIAGNOSIS — F319 Bipolar disorder, unspecified: Secondary | ICD-10-CM | POA: Diagnosis present

## 2020-12-07 DIAGNOSIS — I10 Essential (primary) hypertension: Secondary | ICD-10-CM

## 2020-12-07 DIAGNOSIS — J9601 Acute respiratory failure with hypoxia: Secondary | ICD-10-CM

## 2020-12-07 DIAGNOSIS — R69 Illness, unspecified: Secondary | ICD-10-CM | POA: Diagnosis not present

## 2020-12-07 DIAGNOSIS — E1140 Type 2 diabetes mellitus with diabetic neuropathy, unspecified: Secondary | ICD-10-CM | POA: Diagnosis present

## 2020-12-07 DIAGNOSIS — K2100 Gastro-esophageal reflux disease with esophagitis, without bleeding: Secondary | ICD-10-CM

## 2020-12-07 DIAGNOSIS — Z79899 Other long term (current) drug therapy: Secondary | ICD-10-CM | POA: Diagnosis not present

## 2020-12-07 DIAGNOSIS — R0902 Hypoxemia: Secondary | ICD-10-CM | POA: Diagnosis not present

## 2020-12-07 DIAGNOSIS — Z79890 Hormone replacement therapy (postmenopausal): Secondary | ICD-10-CM | POA: Diagnosis not present

## 2020-12-07 DIAGNOSIS — N179 Acute kidney failure, unspecified: Secondary | ICD-10-CM | POA: Diagnosis not present

## 2020-12-07 HISTORY — DX: COVID-19: U07.1

## 2020-12-07 HISTORY — DX: Elevation of levels of liver transaminase levels: R74.01

## 2020-12-07 LAB — ECHOCARDIOGRAM LIMITED
Area-P 1/2: 3.97 cm2
Height: 64 in
S' Lateral: 2.52 cm
Single Plane A4C EF: 64.1 %
Weight: 3679.04 oz

## 2020-12-07 LAB — CBC WITH DIFFERENTIAL/PLATELET
Abs Immature Granulocytes: 0.97 10*3/uL — ABNORMAL HIGH (ref 0.00–0.07)
Basophils Absolute: 0.1 10*3/uL (ref 0.0–0.1)
Basophils Relative: 1 %
Eosinophils Absolute: 0.2 10*3/uL (ref 0.0–0.5)
Eosinophils Relative: 1 %
HCT: 40.7 % (ref 36.0–46.0)
Hemoglobin: 13.1 g/dL (ref 12.0–15.0)
Immature Granulocytes: 5 %
Lymphocytes Relative: 3 %
Lymphs Abs: 0.6 10*3/uL — ABNORMAL LOW (ref 0.7–4.0)
MCH: 30.2 pg (ref 26.0–34.0)
MCHC: 32.2 g/dL (ref 30.0–36.0)
MCV: 93.8 fL (ref 80.0–100.0)
Monocytes Absolute: 1 10*3/uL (ref 0.1–1.0)
Monocytes Relative: 5 %
Neutro Abs: 17.6 10*3/uL — ABNORMAL HIGH (ref 1.7–7.7)
Neutrophils Relative %: 85 %
Platelets: 247 10*3/uL (ref 150–400)
RBC: 4.34 MIL/uL (ref 3.87–5.11)
RDW: 14.4 % (ref 11.5–15.5)
WBC: 20.4 10*3/uL — ABNORMAL HIGH (ref 4.0–10.5)
nRBC: 0 % (ref 0.0–0.2)

## 2020-12-07 LAB — BLOOD GAS, VENOUS
Acid-Base Excess: 0.8 mmol/L (ref 0.0–2.0)
Bicarbonate: 23.4 mmol/L (ref 20.0–28.0)
FIO2: 90
O2 Saturation: 33.8 %
Patient temperature: 37
pCO2, Ven: 43 mmHg — ABNORMAL LOW (ref 44.0–60.0)
pH, Ven: 7.385 (ref 7.250–7.430)
pO2, Ven: <31 mmHg — CL (ref 32.0–45.0)

## 2020-12-07 LAB — URINALYSIS, ROUTINE W REFLEX MICROSCOPIC
Bilirubin Urine: NEGATIVE
Glucose, UA: 50 mg/dL — AB
Hgb urine dipstick: NEGATIVE
Ketones, ur: 20 mg/dL — AB
Nitrite: NEGATIVE
Protein, ur: 100 mg/dL — AB
Specific Gravity, Urine: 1.025 (ref 1.005–1.030)
pH: 6 (ref 5.0–8.0)

## 2020-12-07 LAB — COMPREHENSIVE METABOLIC PANEL
ALT: 42 U/L (ref 0–44)
AST: 52 U/L — ABNORMAL HIGH (ref 15–41)
Albumin: 2.6 g/dL — ABNORMAL LOW (ref 3.5–5.0)
Alkaline Phosphatase: 67 U/L (ref 38–126)
Anion gap: 13 (ref 5–15)
BUN: 41 mg/dL — ABNORMAL HIGH (ref 8–23)
CO2: 23 mmol/L (ref 22–32)
Calcium: 8.9 mg/dL (ref 8.9–10.3)
Chloride: 100 mmol/L (ref 98–111)
Creatinine, Ser: 1.05 mg/dL — ABNORMAL HIGH (ref 0.44–1.00)
GFR, Estimated: 58 mL/min — ABNORMAL LOW (ref >60)
Glucose, Bld: 397 mg/dL — ABNORMAL HIGH (ref 70–99)
Potassium: 3.8 mmol/L (ref 3.5–5.1)
Sodium: 136 mmol/L (ref 135–145)
Total Bilirubin: 1.7 mg/dL — ABNORMAL HIGH (ref 0.3–1.2)
Total Protein: 6 g/dL — ABNORMAL LOW (ref 6.5–8.1)

## 2020-12-07 LAB — VALPROIC ACID LEVEL: Valproic Acid Lvl: 64 ug/mL (ref 50.0–100.0)

## 2020-12-07 LAB — LACTIC ACID, PLASMA
Lactic Acid, Venous: 1.5 mmol/L (ref 0.5–1.9)
Lactic Acid, Venous: 1.3 mmol/L (ref 0.5–1.9)

## 2020-12-07 LAB — CBG MONITORING, ED
Glucose-Capillary: 169 mg/dL — ABNORMAL HIGH (ref 70–99)
Glucose-Capillary: 259 mg/dL — ABNORMAL HIGH (ref 70–99)
Glucose-Capillary: 92 mg/dL (ref 70–99)
Glucose-Capillary: 151 mg/dL — ABNORMAL HIGH (ref 70–99)

## 2020-12-07 LAB — PROCALCITONIN: Procalcitonin: <0.1 ng/mL

## 2020-12-07 IMAGING — DX DG CHEST 1V PORT
1 series · 1 of 1 positions shown · non-contrast
Comparison: [DATE]

CLINICAL DATA: Hypoxia, positive [J8] test

EXAM:
PORTABLE CHEST 1 VIEW

[chest ap]
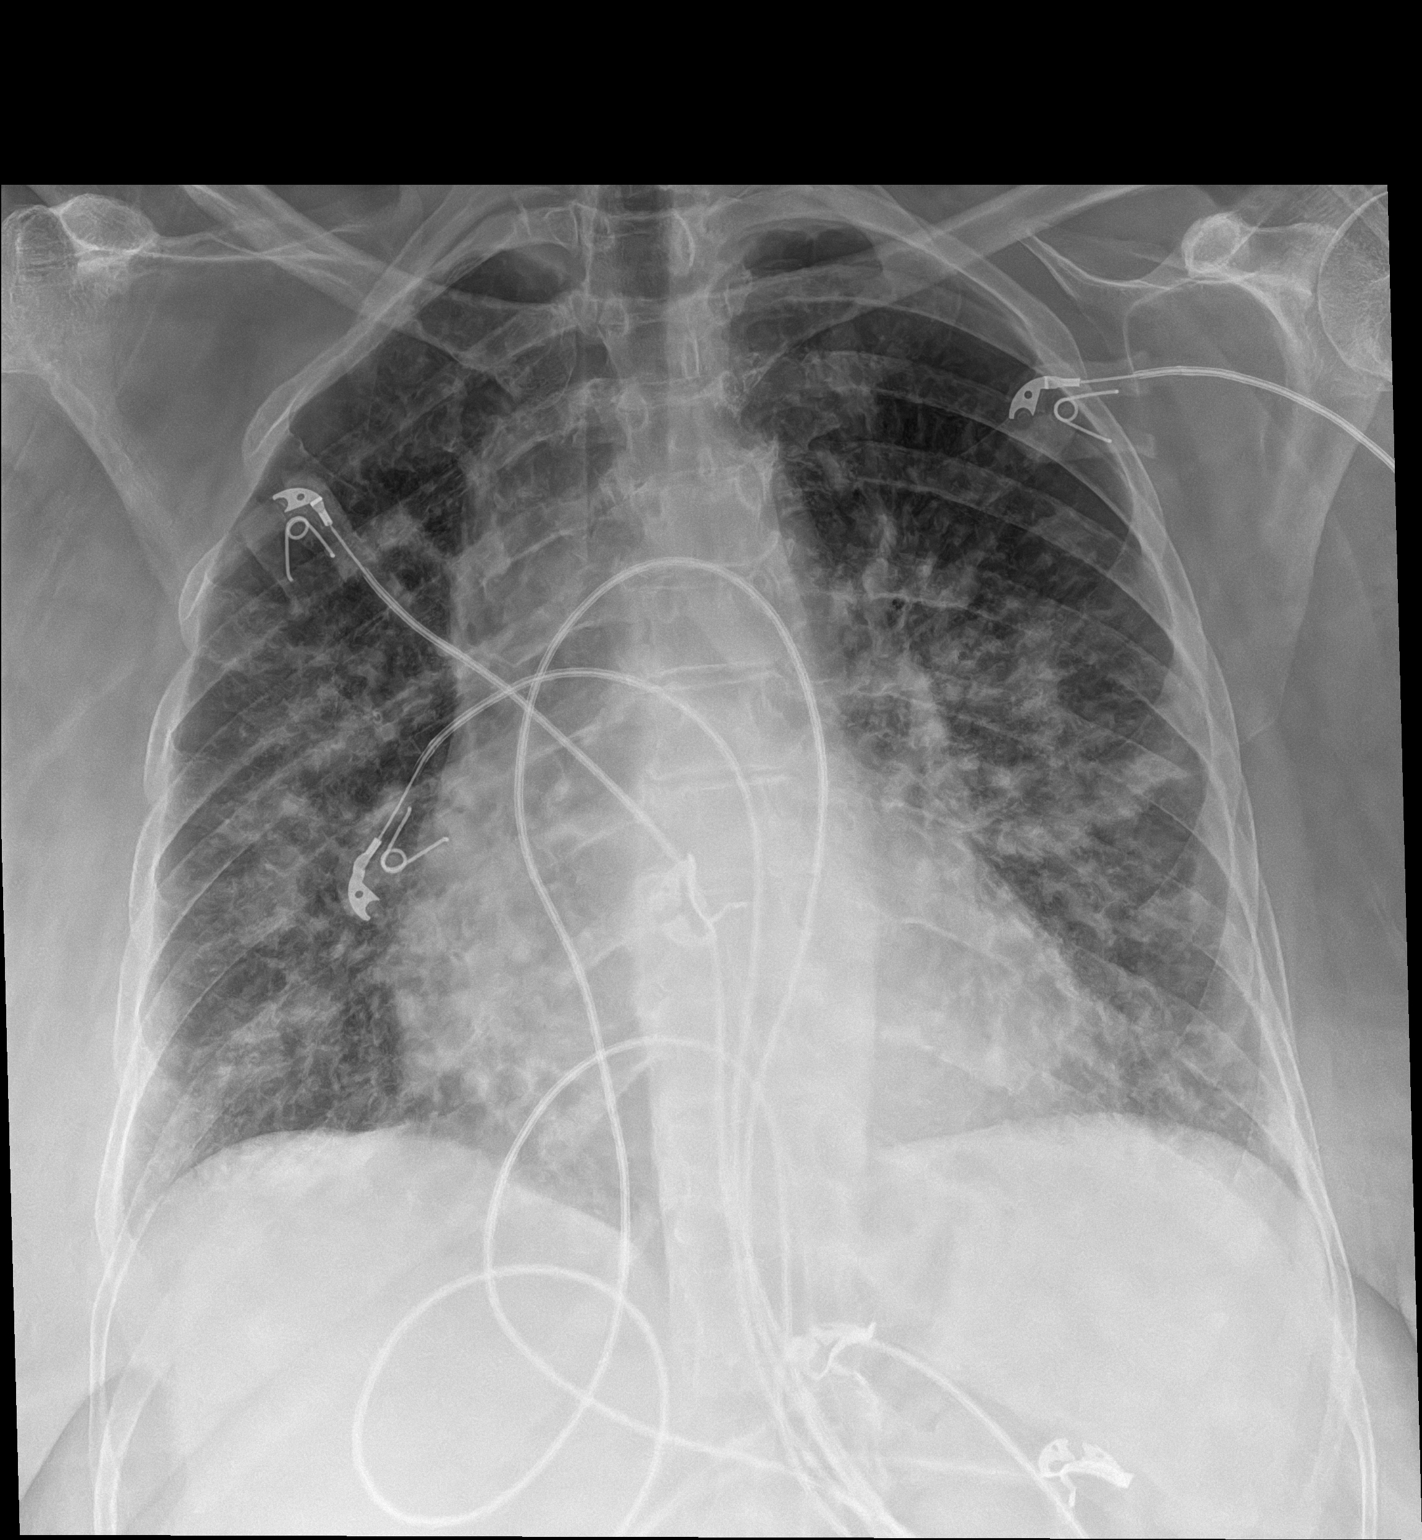

[1 of 1 positions shown; findings below may reference images not displayed]

FINDINGS: Cardiac shadow is stable. Aortic calcifications are seen. Increase
in patchy airspace opacities bilaterally consistent with the given
clinical history of [J8] positivity. No sizable effusion is
seen. No bony abnormality is noted.
IMPRESSION: Increasing airspace opacity bilaterally consistent with the given
clinical history of [J8] positivity.

## 2020-12-07 IMAGING — DX DG FOOT COMPLETE 3+V*L*
3 series · 3 of 3 positions shown · non-contrast
Comparison: None.

CLINICAL DATA: Fall 1 week ago with persistent foot pain, initial
encounter

EXAM:
LEFT FOOT - COMPLETE 3+ VIEW

[foot ap]
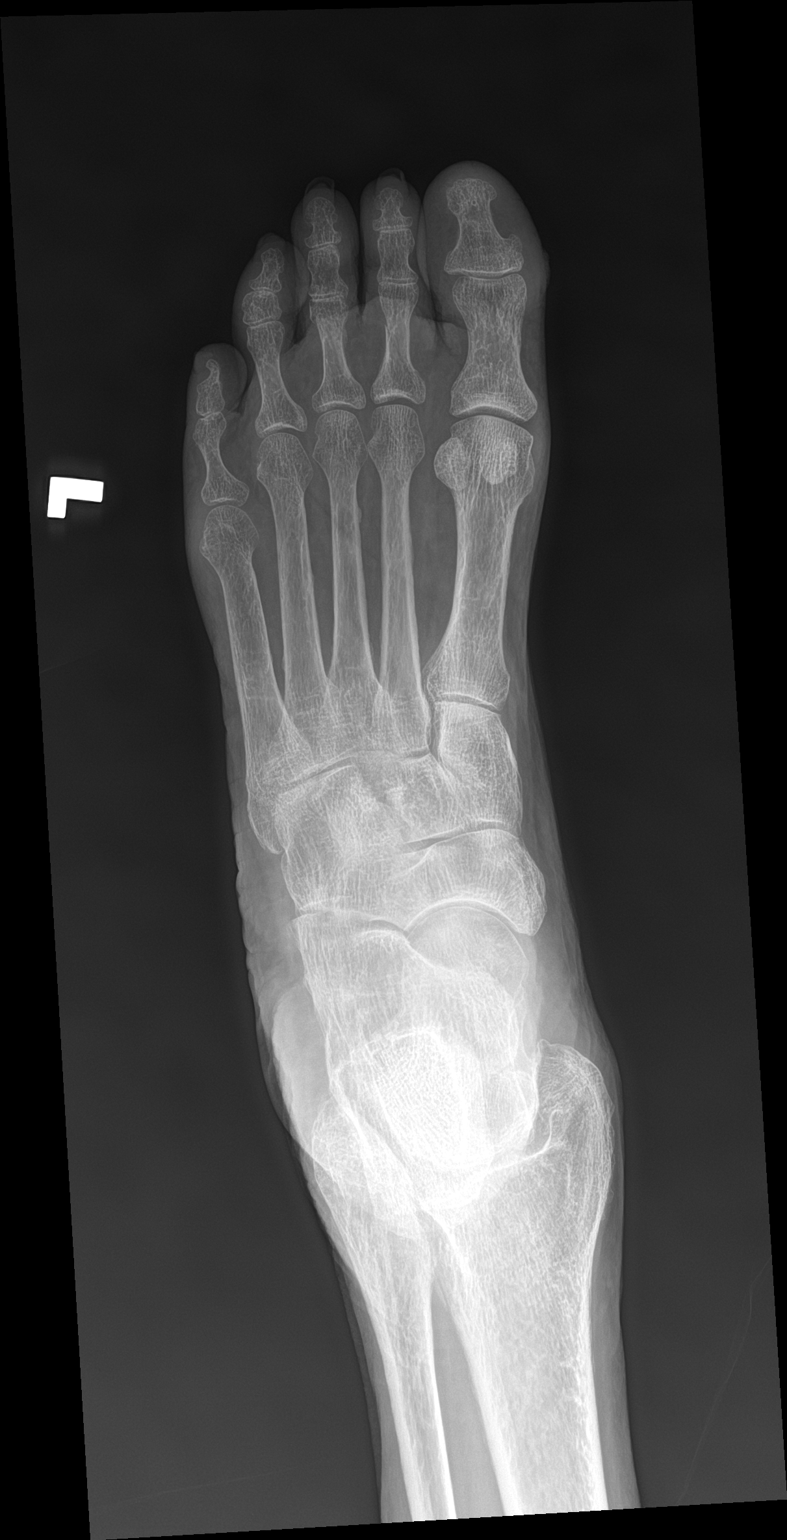

[foot obl]
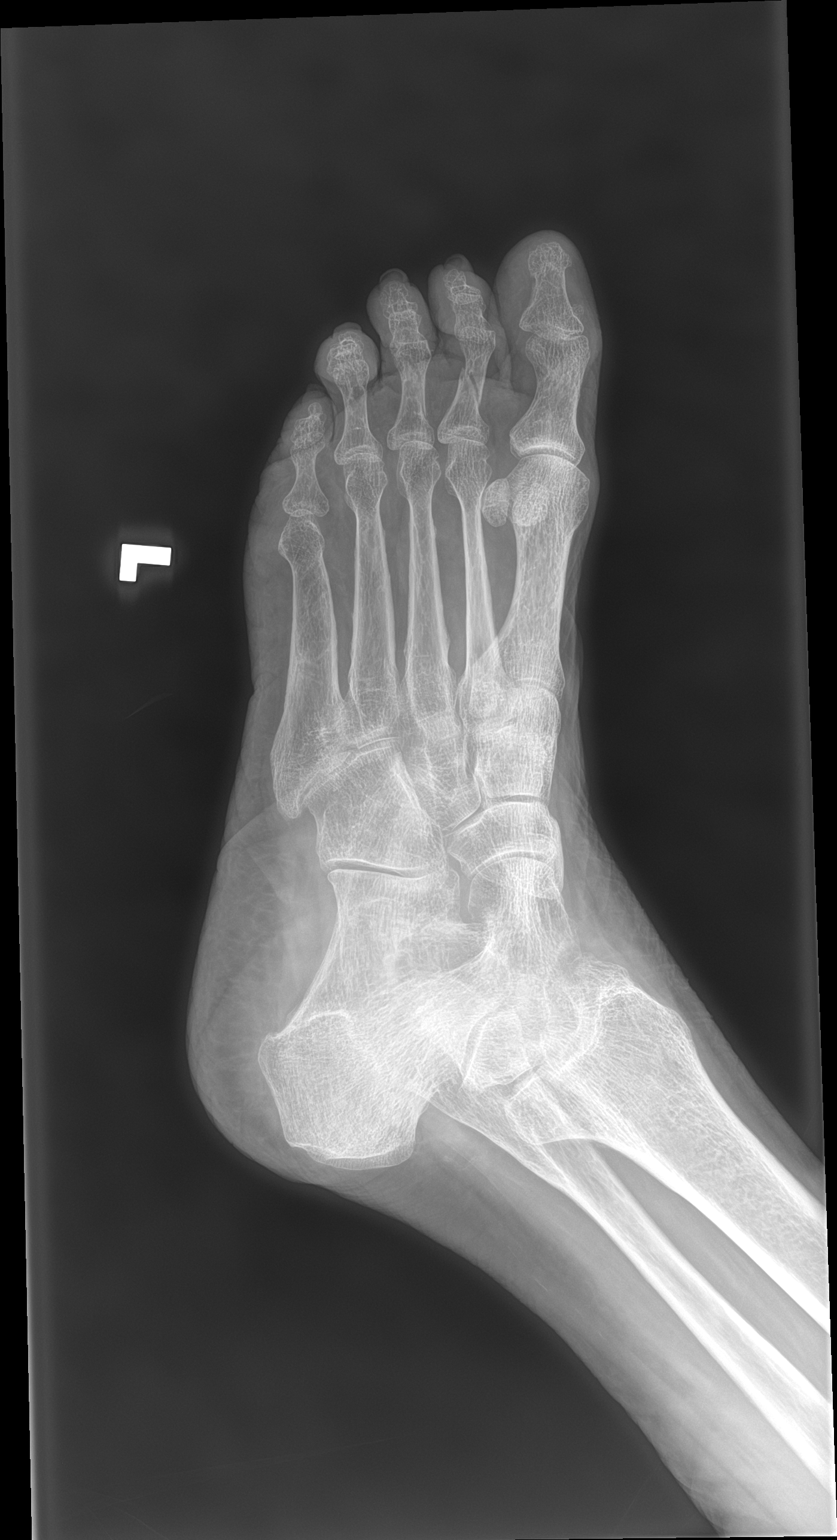

[foot lat]
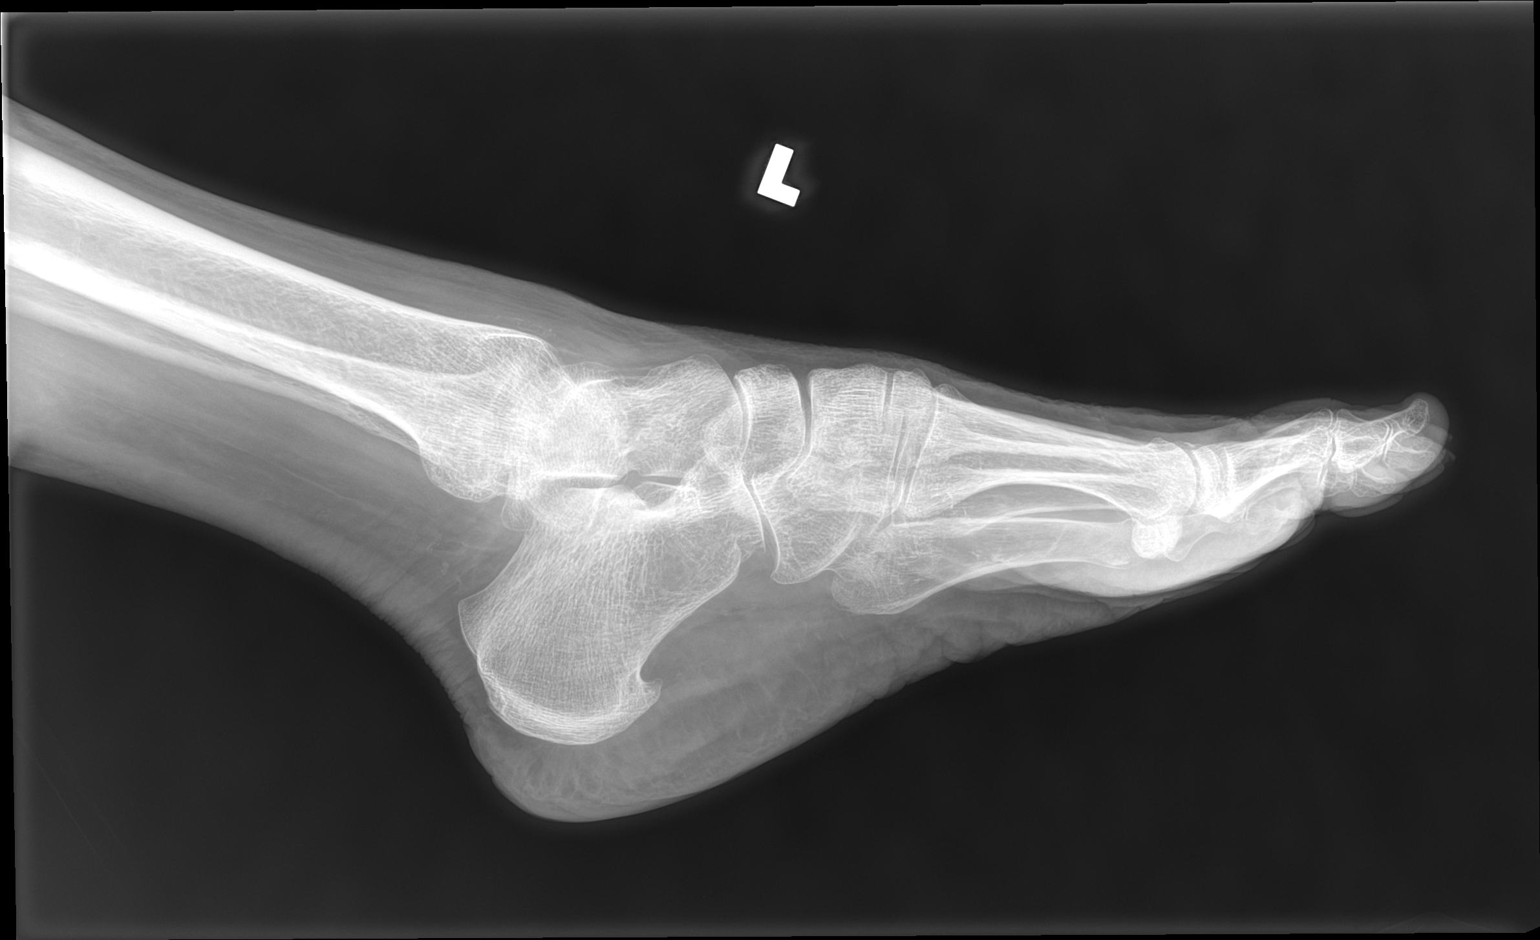

[3 of 3 positions shown; findings below may reference images not displayed]

FINDINGS: Undisplaced fractures of the second and third proximal phalanges are
noted. No other fracture is seen. No soft tissue abnormality is
noted.
IMPRESSION: Fractures of the second and third proximal phalanges.

## 2020-12-07 MED ORDER — LINAGLIPTIN 5 MG PO TABS
5.0000 mg | ORAL_TABLET | Freq: Every day | ORAL | Status: DC
  Administered 2020-12-08 – 2020-12-12 (×5): 5 mg via ORAL
  Filled 2020-12-07 (×6): qty 1

## 2020-12-07 MED ORDER — GUAIFENESIN-DM 100-10 MG/5ML PO SYRP: 10.0000 mL | ORAL_SOLUTION | ORAL | Status: DC | PRN

## 2020-12-07 MED ORDER — ENOXAPARIN SODIUM 60 MG/0.6ML ~~LOC~~ SOLN
60.0000 mg | SUBCUTANEOUS | Status: DC
  Administered 2020-12-08 – 2020-12-09 (×2): 60 mg via SUBCUTANEOUS
  Filled 2020-12-07 (×2): qty 0.6

## 2020-12-07 MED ORDER — INSULIN DETEMIR 100 UNIT/ML ~~LOC~~ SOLN
0.1500 [IU]/kg | Freq: Two times a day (BID) | SUBCUTANEOUS | Status: DC
  Administered 2020-12-07: 16 [IU] via SUBCUTANEOUS
  Filled 2020-12-07 (×4): qty 0.16

## 2020-12-07 MED ORDER — ASCORBIC ACID 500 MG PO TABS
500.0000 mg | ORAL_TABLET | Freq: Every day | ORAL | Status: DC
  Administered 2020-12-07 – 2020-12-12 (×6): 500 mg via ORAL
  Filled 2020-12-07 (×6): qty 1

## 2020-12-07 MED ORDER — ACETAMINOPHEN 325 MG PO TABS
650.0000 mg | ORAL_TABLET | Freq: Four times a day (QID) | ORAL | Status: DC | PRN
  Administered 2020-12-08 – 2020-12-11 (×3): 650 mg via ORAL
  Filled 2020-12-07 (×4): qty 2

## 2020-12-07 MED ORDER — MIRTAZAPINE 15 MG PO TABS
7.5000 mg | ORAL_TABLET | Freq: Every day | ORAL | Status: DC
  Administered 2020-12-07 – 2020-12-11 (×5): 7.5 mg via ORAL
  Filled 2020-12-07 (×5): qty 1

## 2020-12-07 MED ORDER — INSULIN ASPART 100 UNIT/ML ~~LOC~~ SOLN
5.0000 [IU] | Freq: Once | SUBCUTANEOUS | Status: AC
  Administered 2020-12-07: 5 [IU] via INTRAVENOUS
  Filled 2020-12-07: qty 1

## 2020-12-07 MED ORDER — SIMVASTATIN 20 MG PO TABS
40.0000 mg | ORAL_TABLET | Freq: Every day | ORAL | Status: DC
  Administered 2020-12-07: 40 mg via ORAL
  Filled 2020-12-07: qty 4

## 2020-12-07 MED ORDER — SODIUM CHLORIDE 0.9 % IV BOLUS
500.0000 mL | Freq: Once | INTRAVENOUS | Status: AC
  Administered 2020-12-07: 500 mL via INTRAVENOUS

## 2020-12-07 MED ORDER — DILTIAZEM HCL 25 MG/5ML IV SOLN
10.0000 mg | Freq: Once | INTRAVENOUS | Status: AC
  Administered 2020-12-07: 10 mg via INTRAVENOUS
  Filled 2020-12-07: qty 5

## 2020-12-07 MED ORDER — QUETIAPINE FUMARATE 100 MG PO TABS
100.0000 mg | ORAL_TABLET | Freq: Every day | ORAL | Status: DC
  Administered 2020-12-07 – 2020-12-11 (×5): 100 mg via ORAL
  Filled 2020-12-07 (×5): qty 1

## 2020-12-07 MED ORDER — INSULIN ASPART 100 UNIT/ML ~~LOC~~ SOLN
6.0000 [IU] | Freq: Three times a day (TID) | SUBCUTANEOUS | Status: DC
  Administered 2020-12-07 – 2020-12-12 (×11): 6 [IU] via SUBCUTANEOUS
  Filled 2020-12-07 (×2): qty 1

## 2020-12-07 MED ORDER — PAROXETINE HCL 20 MG PO TABS
40.0000 mg | ORAL_TABLET | Freq: Every day | ORAL | Status: DC
  Administered 2020-12-08 – 2020-12-12 (×5): 40 mg via ORAL
  Filled 2020-12-07 (×5): qty 2

## 2020-12-07 MED ORDER — LEVOTHYROXINE SODIUM 50 MCG PO TABS
200.0000 ug | ORAL_TABLET | Freq: Every day | ORAL | Status: DC
  Administered 2020-12-07 – 2020-12-08 (×2): 200 ug via ORAL
  Filled 2020-12-07 (×2): qty 4

## 2020-12-07 MED ORDER — INSULIN ASPART 100 UNIT/ML ~~LOC~~ SOLN
0.0000 [IU] | Freq: Every day | SUBCUTANEOUS | Status: DC
  Administered 2020-12-08: 5 [IU] via SUBCUTANEOUS
  Administered 2020-12-09 – 2020-12-11 (×3): 2 [IU] via SUBCUTANEOUS

## 2020-12-07 MED ORDER — METHYLPREDNISOLONE SODIUM SUCC 125 MG IJ SOLR
0.5000 mg/kg | Freq: Two times a day (BID) | INTRAMUSCULAR | Status: AC
  Administered 2020-12-07 – 2020-12-09 (×6): 51.875 mg via INTRAVENOUS
  Filled 2020-12-07 (×6): qty 2

## 2020-12-07 MED ORDER — GABAPENTIN 100 MG PO CAPS
100.0000 mg | ORAL_CAPSULE | Freq: Three times a day (TID) | ORAL | Status: DC
  Administered 2020-12-07 – 2020-12-12 (×16): 100 mg via ORAL
  Filled 2020-12-07 (×16): qty 1

## 2020-12-07 MED ORDER — GLUCERNA SHAKE PO LIQD
237.0000 mL | Freq: Three times a day (TID) | ORAL | Status: DC
  Administered 2020-12-07 – 2020-12-12 (×13): 237 mL via ORAL
  Filled 2020-12-07 (×8): qty 237

## 2020-12-07 MED ORDER — INSULIN DETEMIR 100 UNIT/ML ~~LOC~~ SOLN
20.0000 [IU] | Freq: Two times a day (BID) | SUBCUTANEOUS | Status: DC
  Administered 2020-12-07 – 2020-12-11 (×8): 20 [IU] via SUBCUTANEOUS
  Filled 2020-12-07 (×18): qty 0.2

## 2020-12-07 MED ORDER — PREDNISONE 20 MG PO TABS
50.0000 mg | ORAL_TABLET | Freq: Every day | ORAL | Status: DC
  Administered 2020-12-10 – 2020-12-12 (×3): 50 mg via ORAL
  Filled 2020-12-07 (×3): qty 1

## 2020-12-07 MED ORDER — ALBUTEROL SULFATE HFA 108 (90 BASE) MCG/ACT IN AERS
2.0000 | INHALATION_SPRAY | Freq: Four times a day (QID) | RESPIRATORY_TRACT | Status: DC
  Administered 2020-12-07 (×4): 2 via RESPIRATORY_TRACT
  Filled 2020-12-07: qty 6.7

## 2020-12-07 MED ORDER — INSULIN ASPART 100 UNIT/ML ~~LOC~~ SOLN
0.0000 [IU] | Freq: Three times a day (TID) | SUBCUTANEOUS | Status: DC
  Administered 2020-12-07: 8 [IU] via SUBCUTANEOUS
  Administered 2020-12-07: 6 [IU] via SUBCUTANEOUS
  Administered 2020-12-08: 8 [IU] via SUBCUTANEOUS
  Administered 2020-12-09: 3 [IU] via SUBCUTANEOUS
  Administered 2020-12-09: 11 [IU] via SUBCUTANEOUS
  Administered 2020-12-09: 5 [IU] via SUBCUTANEOUS
  Administered 2020-12-10: 2 [IU] via SUBCUTANEOUS
  Administered 2020-12-11: 15 [IU] via SUBCUTANEOUS
  Administered 2020-12-12: 3 [IU] via SUBCUTANEOUS
  Filled 2020-12-07 (×2): qty 1

## 2020-12-07 MED ORDER — ENOXAPARIN SODIUM 40 MG/0.4ML ~~LOC~~ SOLN
40.0000 mg | SUBCUTANEOUS | Status: DC
  Administered 2020-12-07: 40 mg via SUBCUTANEOUS
  Filled 2020-12-07: qty 0.4

## 2020-12-07 MED ORDER — BARICITINIB 2 MG PO TABS
4.0000 mg | ORAL_TABLET | Freq: Every day | ORAL | Status: DC
  Administered 2020-12-07 – 2020-12-12 (×6): 4 mg via ORAL
  Filled 2020-12-07 (×6): qty 2

## 2020-12-07 MED ORDER — ZINC SULFATE 220 (50 ZN) MG PO CAPS
220.0000 mg | ORAL_CAPSULE | Freq: Every day | ORAL | Status: DC
  Administered 2020-12-07 – 2020-12-12 (×6): 220 mg via ORAL
  Filled 2020-12-07 (×6): qty 1

## 2020-12-07 MED ORDER — LEVOTHYROXINE SODIUM 50 MCG PO TABS: 200.0000 ug | ORAL_TABLET | Freq: Every day | ORAL | Status: DC

## 2020-12-07 MED ORDER — HYDROCOD POLST-CPM POLST ER 10-8 MG/5ML PO SUER: 5.0000 mL | Freq: Two times a day (BID) | ORAL | Status: DC | PRN

## 2020-12-07 MED ORDER — LISINOPRIL 10 MG PO TABS
20.0000 mg | ORAL_TABLET | Freq: Every day | ORAL | Status: DC
  Administered 2020-12-07 – 2020-12-12 (×6): 20 mg via ORAL
  Filled 2020-12-07 (×7): qty 2

## 2020-12-07 MED ORDER — AMLODIPINE BESYLATE 5 MG PO TABS: 5.0000 mg | ORAL_TABLET | Freq: Every day | ORAL | Status: DC

## 2020-12-07 MED ORDER — PANTOPRAZOLE SODIUM 40 MG IV SOLR
40.0000 mg | Freq: Every day | INTRAVENOUS | Status: DC
  Administered 2020-12-07 – 2020-12-11 (×5): 40 mg via INTRAVENOUS
  Filled 2020-12-07 (×5): qty 40

## 2020-12-07 MED ORDER — DILTIAZEM HCL-DEXTROSE 125-5 MG/125ML-% IV SOLN (PREMIX)
5.0000 mg/h | INTRAVENOUS | Status: DC
  Administered 2020-12-07: 5 mg/h via INTRAVENOUS
  Filled 2020-12-07: qty 125

## 2020-12-07 MED ORDER — ALBUTEROL SULFATE HFA 108 (90 BASE) MCG/ACT IN AERS
2.0000 | INHALATION_SPRAY | Freq: Three times a day (TID) | RESPIRATORY_TRACT | Status: DC
  Administered 2020-12-08 – 2020-12-11 (×12): 2 via RESPIRATORY_TRACT

## 2020-12-07 NOTE — ED Notes (Signed)
BS 259 

## 2020-12-07 NOTE — H&P (Signed)
History and Physical  Claudia Keller [id number] DOB: 07-06-1951 DOA: 12/06/2020  Referring physician: Rolland Porter, MD PCP: Rosita Fire, MD  Patient coming from: Home  Chief Complaint: Left foot pain and elevated blood glucose level  HPI: Claudia Keller is a 70 y.o. female with medical history significant for COPD, morbid obesity, hypertension, hypothyroidism, hyperlipidemia, type 2 diabetes mellitus with nephropathy and neuropathy, tobacco abuse and depression/anxiety who presents to the emergency department due to left foot pain and elevated blood glucose level.  Patient was recently admitted and discharged from 1/24-1/26 due to acute respiratory failure with hypoxia secondary to COVID-19 pneumonia and uncontrolled type 2 diabetes mellitus with A1c of 10.2%.  She was treated with remdesivir for 3 days and she did not require home oxygen on discharge. Patient states that she slipped at home and hurt her foot when she presented to the emergency department on December 01, 2020 and states that the foot was not addressed during the admission, she complained of left foot pain at home.  She also states that she has had difficulty in being able to control her blood glucose level at home.  When asked if she has been compliant with her medications, she states that she tries her best.  She lives alone and she activated EMS, who reported that pulse ox was 82% on room air and a CBG prior to arrival was 526.  ED Course:  In the emergency department, O2 sat was 88% and she was placed on supplemental oxygen at 4LPM.  O2 sat was done and showed hypoxia.  Work-up in the ED showed leukocytosis, hyperglycemia, hypoalbuminemia, elevated AST.  Lactic acid 1.5. Chest x-ray showed Increasing airspace opacity bilaterally consistent with the given clinical history of COVID-19 positivity. Left foot x-ray showed fractures of the second and third proximal phalanges IV hydration of 500 mL of NS was provided, insulin 5  units was given.  Hospitalist was asked to admit patient for further evaluation and management.  Review of Systems: Constitutional: Negative for chills and fever.  HENT: Negative for ear pain and sore throat.   Eyes: Negative for pain and visual disturbance.  Respiratory: Positive for shortness of breath negative for chest tightness  Cardiovascular: Negative for chest pain and palpitations.  Gastrointestinal: Negative for abdominal pain and vomiting.  Endocrine: Negative for polyphagia and polyuria.  Genitourinary: Negative for decreased urine volume, dysuria Musculoskeletal: Positive for left foot pain negative for back pain.  Skin: Negative for color change and rash.  Allergic/Immunologic: Negative for immunocompromised state.  Neurological: Negative for tremors, syncope, speech difficulty, weakness, light-headedness and headaches.  Hematological: Does not bruise/bleed easily.  All other systems reviewed and are negative   Past Medical History:  Diagnosis Date  . Arthritis   . COPD (chronic obstructive pulmonary disease) ([place])   . Diverticulosis   . Essential hypertension, benign   . Hx of colonic polyps   . Hypothyroidism   . IBS (irritable bowel syndrome)   . Irritable bowel syndrome   . Ketoacidosis   . Mixed hyperlipidemia   . PTSD (post-traumatic stress disorder)   . S/P colonoscopy Jan 2011   RMR: pancolonic diverticula, polypoid rectal mucosa with  prominent lymphoid aggregates, repeat in Jan 2016   . Shortness of breath dyspnea   . Stress incontinence, female   . Thyroid disease   . Tobacco use   . Type 2 diabetes mellitus ([place])    Past Surgical History:  Procedure Laterality Date  . Bilateral tubal ligation    .  BREAST BIOPSY Left 10/16/2014   negative  . CHOLECYSTECTOMY  09/11/2012   Procedure: LAPAROSCOPIC CHOLECYSTECTOMY;  Surgeon: Jamesetta So, MD;  Location: [facility];  Service: General;  Laterality: N/A;  . COLONOSCOPY  11/17/2009   Dr. Gala Romney: normal  rectum, pancolonic diverticula, polyps benign, no adenomas.   . COLONOSCOPY N/A 02/12/2015   Dr. Gala Romney: colonic diverticulosis, multiple colonic polyps (tubular adenomas), negative segmental biopsies. Surveillance in 2021  . ESOPHAGOGASTRODUODENOSCOPY  11/23/2011   Dr. Gala Romney: Erosive reflux esophagitis; small hiatal hernia; esophagus dilated empirically  . MALONEY DILATION  11/23/2011   Procedure: Venia Minks DILATION;  Surgeon: Daneil Dolin, MD;  Location: [facility]ITE;  Service: Endoscopy;  Laterality: N/A;  . SKIN LESION EXCISION     on buttocks  . TUBAL LIGATION      Social History:  reports that she has been smoking cigarettes. She has a 86.00 pack-year smoking history. She has never used smokeless tobacco. She reports that she does not drink alcohol and does not use drugs.   No Known Allergies  Family History  Problem Relation Age of Onset  . Schizophrenia Brother   . Stroke Brother   . Cancer Mother        unsure what type  . Heart disease Father   . Heart attack Father   . Coronary artery disease Other   . Diabetes type II Other   . Cancer Son        bladder  . Colon cancer Neg Hx      Prior to Admission medications   Medication Sig Start Date End Date Taking? Authorizing Provider  albuterol (VENTOLIN HFA) 108 (90 Base) MCG/ACT inhaler Inhale 2 puffs into the lungs every 4 (four) hours as needed for wheezing or shortness of breath. 12/03/20   Manuella Ghazi, Pratik D, DO  amLODipine (NORVASC) 5 MG tablet Take 1 tablet (5 mg total) by mouth daily. 12/03/20   Manuella Ghazi, Pratik D, DO  ascorbic acid (VITAMIN C) 500 MG tablet Take 1 tablet (500 mg total) by mouth daily for 15 days. 12/04/20 12/19/20  Manuella Ghazi, Pratik D, DO  Calcium Carb-Cholecalciferol (OYSTER SHELL CALCIUM W/D) 500-200 MG-UNIT TABS Take 1 tablet by mouth 2 (two) times daily. 11/10/20   [provider]  cetirizine (ZYRTEC) 10 MG tablet Take 10 mg by mouth daily. 11/10/20   [provider]  colestipol (COLESTID) 1 g tablet  TAKE 2 TABLETS DAILY FOR DIARRHEA. DO NOT TAKE WITHIN 2 HOURS OF OTHER ORAL MEDICATIONS. HOLD FOR CONSTIPATION. 05/23/20   Carlis Stable, NP  divalproex (DEPAKOTE ER) 500 MG 24 hr tablet Take 2 tablets (1,000 mg total) by mouth every evening. 11/21/20   Norman Clay, MD  divalproex (DEPAKOTE) 250 MG DR tablet Take 1 tablet (250 mg total) by mouth daily. 11/21/20   Norman Clay, MD  furosemide (LASIX) 20 MG tablet Take 20 mg by mouth daily.    [provider]  gabapentin (NEURONTIN) 100 MG capsule Take 100 mg by mouth 3 (three) times daily.    [provider]  guaiFENesin-dextromethorphan (ROBITUSSIN DM) 100-10 MG/5ML syrup Take 10 mLs by mouth every 4 (four) hours as needed for cough. 12/03/20   Manuella Ghazi, Pratik D, DO  insulin glargine (LANTUS) 100 UNIT/ML injection Inject 0.45 mLs (45 Units total) into the skin 2 (two) times daily. Patient taking differently: Inject 100 Units into the skin at bedtime. 03/20/20   Barton Dubois, MD  insulin lispro (HUMALOG) 100 UNIT/ML injection Inject 15-24 Units into the skin  3 (three) times daily with meals. Per sliding scale. 151-200=18 units; 201-250= 9 units; 251-300=20 units; 301-350=21 units; 351-400= 22 units; 401-450=23 units; 500-551=24 units.    [provider]  levothyroxine (SYNTHROID) 200 MCG tablet Take 200 mcg by mouth daily before breakfast.     [provider]  linagliptin (TRADJENTA) 5 MG TABS tablet Take 5 mg by mouth daily.     [provider]  lisinopril (PRINIVIL,ZESTRIL) 20 MG tablet Take 20 mg by mouth daily.    [provider]  metFORMIN (GLUCOPHAGE) 500 MG tablet Take 500 mg by mouth 2 (two) times daily. 11/12/20   [provider]  mirtazapine (REMERON) 7.5 MG tablet Take 1 tablet (7.5 mg total) by mouth at bedtime. 11/21/20   Norman Clay, MD  nicotine (NICODERM CQ - DOSED IN MG/24 HOURS) 21 mg/24hr patch Place 1 patch (21 mg total) onto the skin daily. Patient not taking: No sig  reported 03/21/20   Barton Dubois, MD  NOVOLOG FLEXPEN 100 UNIT/ML FlexPen Inject 15 Units into the skin in the morning, at noon, and at bedtime. 10/13/20   [provider]  ondansetron (ZOFRAN) 4 MG tablet Take 1 tablet (4 mg total) by mouth every 8 (eight) hours as needed for nausea or vomiting. 10/17/18   Carlis Stable, NP  pantoprazole (PROTONIX) 40 MG tablet TAKE 1 TABLET BY MOUTH ONCE DAILY 30 MINTUES BEFORE BREAKFAST. Patient taking differently: Take 40 mg by mouth daily before breakfast. 05/23/20   Carlis Stable, NP  PARoxetine (PAXIL) 40 MG tablet Take 1 tablet (40 mg total) by mouth daily. 11/23/20   Norman Clay, MD  predniSONE (DELTASONE) 50 MG tablet Take 1 tablet (50 mg total) by mouth daily for 5 days. 12/04/20 12/09/20  Manuella Ghazi, Pratik D, DO  QUEtiapine (SEROQUEL) 100 MG tablet Take 1 tablet (100 mg total) by mouth at bedtime. 11/21/20   Norman Clay, MD  simvastatin (ZOCOR) 40 MG tablet Take 40 mg by mouth at bedtime.     [provider]  zinc sulfate 220 (50 Zn) MG capsule Take 1 capsule (220 mg total) by mouth daily for 15 days. 12/04/20 12/19/20  Heath Lark D, DO    Physical Exam: BP 140/68   Pulse 84   Temp 97.8 F (36.6 C)   Resp 19   Ht 5\' 4"  (1.626 m)   Wt 104.3 kg   SpO2 92%   BMI 39.47 kg/m   . General: 70 y.o. year-old female obese who was in no acute distress.  Alert and oriented x3. Marland Kitchen HEENT: NCAT, EOMI, PERRLA . Neck: Supple, trachea medial . Cardiovascular: Regular rate and rhythm with no rubs or gallops.  No thyromegaly or JVD noted.  2/4 pulses in all 4 extremities. Marland Kitchen Respiratory: Tachypnea, mild rhonchi bilaterally.   . Abdomen: Soft nontender nondistended with normal bowel sounds x4 quadrants. . Muskuloskeletal: Tenderness to palpation of left second and third toes.  No cyanosis or clubbing noted bilaterally . Neuro: CN II-XII intact, strength 5/5 x 4, sensation, reflexes intact . Skin: No ulcerative lesions noted or rashes . Psychiatry:  Judgement and insight appear normal. Mood is appropriate for condition and setting          Labs on Admission:  Basic Metabolic Panel: Recent Labs  Lab 12/01/20 1013 12/01/20 1612 12/02/20 0710 12/03/20 0542 12/07/20 0044  NA 135  --  142 144 136  K 4.5  --  5.0 4.1 3.8  CL 96*  --  103 106  100  CO2 25  --  26 26 23   GLUCOSE 399*  --  200* 111* 397*  BUN 35*  --  38* 40* 41*  CREATININE 1.64* 1.35* 1.37* 0.91 1.05*  CALCIUM 10.1  --  9.5 8.9 8.9  MG  --   --  1.6* 1.7  --   PHOS  --   --  3.7 3.2  --    Liver Function Tests: Recent Labs  Lab 12/01/20 1013 12/02/20 0710 12/03/20 0542 12/07/20 0044  AST 16 79* 86* 52*  ALT 12 28 35 42  ALKPHOS 80 71 68 67  BILITOT 1.1 0.8 0.5 1.7*  PROT 6.9 6.8 6.4* 6.0*  ALBUMIN 3.0* 2.8* 2.7* 2.6*   No results for input(s): LIPASE, AMYLASE in the last 168 hours. No results for input(s): AMMONIA in the last 168 hours. CBC: Recent Labs  Lab 12/01/20 1013 12/01/20 1612 12/02/20 0710 12/03/20 0542 12/07/20 0044  WBC 9.7 9.3 7.9 14.0* 20.4*  NEUTROABS 7.8*  --  6.2 11.8* 17.6*  HGB 13.8 14.1 14.4 15.0 13.1  HCT 42.0 43.9 44.5 46.2* 40.7  MCV 91.9 94.2 92.5 93.1 93.8  PLT 228 227 244 266 247   Cardiac Enzymes: No results for input(s): CKTOTAL, CKMB, CKMBINDEX, TROPONINI in the last 168 hours.  BNP (last 3 results) Recent Labs    12/01/20 1051  BNP 174.0*    ProBNP (last 3 results) No results for input(s): PROBNP in the last 8760 hours.  CBG: Recent Labs  Lab 12/02/20 0935 12/02/20 1157 12/02/20 1635 12/02/20 2202 12/03/20 0832  GLUCAP 199* 248* 158* 157* 114*    Radiological Exams on Admission: DG Chest Port 1 View  Result Date: 12/07/2020 CLINICAL DATA:  Hypoxia, positive COVID-19 test EXAM: PORTABLE CHEST 1 VIEW COMPARISON:  12/01/2020 FINDINGS: Cardiac shadow is stable. Aortic calcifications are seen. Increase in patchy airspace opacities bilaterally consistent with the given clinical history of COVID-19  positivity. No sizable effusion is seen. No bony abnormality is noted. IMPRESSION: Increasing airspace opacity bilaterally consistent with the given clinical history of COVID-19 positivity. Electronically Signed   By: Alcide Clever M.D.   On: 12/07/2020 00:33   DG Foot Complete Left  Result Date: 12/07/2020 CLINICAL DATA:  Fall 1 week ago with persistent foot pain, initial encounter EXAM: LEFT FOOT - COMPLETE 3+ VIEW COMPARISON:  None. FINDINGS: Undisplaced fractures of the second and third proximal phalanges are noted. No other fracture is seen. No soft tissue abnormality is noted. IMPRESSION: Fractures of the second and third proximal phalanges. Electronically Signed   By: Alcide Clever M.D.   On: 12/07/2020 00:34    EKG: I independently viewed the EKG done and my findings are as followed: EKG was not done in the ED  Assessment/Plan Present on Admission: . Pneumonia due to COVID-19 virus . Closed fracture of metatarsal bone . Acute respiratory failure with hypoxia (HCC) . Hypercholesterolemia . Essential hypertension, benign . Primary hypothyroidism . Tobacco abuse  Principal Problem:   Pneumonia due to COVID-19 virus Active Problems:   Closed fracture of metatarsal bone   Hypercholesterolemia   Tobacco abuse   Essential hypertension, benign   Primary hypothyroidism   Noncompliance with medication regimen   Acute respiratory failure with hypoxia (HCC)   Leukocytosis   Hyperglycemia due to diabetes mellitus (HCC)   Hypoalbuminemia   Elevated AST (SGOT)   Obesity (BMI 30-39.9)   COPD (chronic obstructive pulmonary disease) (HCC)   GERD (gastroesophageal reflux disease)   Diabetic neuropathy (  [place])   Depression   Anxiety   Acute respiratory failure with hypoxia due to worsening COVID-19 virus pneumonia Patient was admitted from 1/24-1/26 due to above, she was treated with remdesivir for 3 days, discharged with steroids and did not require home oxygen on discharge On returning  today, she was hypoxic and required supplemental oxygen Chest x-ray showed increasing airspace opacity bilaterally consistent with the given clinical history of COVID-19 positivity. Continue albuterol q.6h Continue IV per pharmacy dosing Dictation for IV Remdesivir at this time (already completed dose as described above) Procalcitonin will be checked to rule out possible superimposed bacterial infection Continue vitamin-C 500 mg p.o. Daily Continue zinc 220 mg p.o. Daily Continue Mucinex, Robitussin and Tussionex Continue Tylenol p.r.n. for fever Continue supplemental oxygen to maintain O2 sat > or = 94% with plan to wean patient off supplemental oxygen as tolerated (of note, patient does not use oxygen at baseline) Continue incentive spirometry and flutter valve q61min as tolerated Encourage proning, early ambulation, and side laying as tolerated Continue airborne isolation precaution Continue monitoring daily inflammatory markers Physician PPE:  Surgical mask with face shield, N-95, nonsterile gloves, disposable gown, head and shoe covers Patient PPE:  Face mask   COPD Continue treatment as described above  Fracture of left second and third proximal phalanges Continue fall precaution and neurochecks Continue PT/OT eval and treat Orthopedic surgery will be consulted and we shall await further recommendation.  Leukocytosis secondary to steroid effect vs infectious WBC 20.4, continue to monitor WBC with morning labs  Hyperglycemia secondary to poorly controlled type 2 diabetes mellitus and steroid effect A1c on 1/24 was 10.2 Continue Tradjenta, basal insulin, ISS and hypoglycemic protocol  Hypoalbuminemia secondary to moderate protein calorie malnutrition Protein supplement will be provided  Elevated AST in the setting of COVID-19 virus pneumonia AST 52, she denies any abdominal pain Continue to monitor AST with morning labs  GERD Continue Protonix  Essential  hypertension Continue Norvasc, Zestril  Hypothyroidism Continue Synthroid  History of diabetic neuropathy Continue Neurontin.  Depression/anxiety Continue the use of Paxil, Seroquel and Remeron.  Tobacco abuse Cessation counseling encouraged. Continue the use of nicotine patch.  Medication noncompliance Patient was counseled on importance of being compliant with medication regimen  Obesity (BMI 39.47) Counseled on diet and lifestyle modification   DVT prophylaxis: Lovenox  Code Status: Full code  Family Communication: None at bedside  Disposition Plan:  Patient is from:                        home Anticipated DC to:                   SNF or family members home Anticipated DC date:               2-3 days Anticipated DC barriers:           Unstable to be discharged at this time due to hypoxia with worsening COVID-19 virus pneumonia on chest x-ray as well as fracture of left toes requiring orthopedic evaluation and recommendation   Consults called: Orthopedic surgery  Admission status: Inpatient   Bernadette Hoit MD [facility]  12/07/2020, 4:45 AM

## 2020-12-07 NOTE — Progress Notes (Signed)
  Echocardiogram 2D Echocardiogram has been performed.  [name] Swaim 12/07/2020, 1:30 PM

## 2020-12-07 NOTE — ED Provider Notes (Addendum)
[facility] EMERGENCY DEPARTMENT Provider Note   CSN: [id number] Arrival date & time: 12/06/20  2339   Time seen 11:53 PM  History Chief Complaint  Patient presents with  . Hyperglycemia  . Shortness of Breath    Claudia Keller is a 70 y.o. female.  HPI   Patient was admitted to the hospital January 24 for hyperglycemia and hypoxia and was Covid positive.  She was discharged from the hospital on January 26.  She states that actually she had hurt her foot on January 24 and she is upset because she states it was not addressed.  She states she fell and twisted her left foot although when I first went in the room she told me she had fallen tonight and injured her foot.  She then changed her story that it happened a week ago.  She states she does not check her blood sugars.  When I asked her if she is taking her insulin and her medications she states "I take them the best that I can".  Patient states she lives alone.  EMS reported her pulse ox was 82% on room air.  They also got a CBG of 526.  Patient did not take the Covid vaccine  PCP Claudia Fire, Claudia Keller   Past Medical History:  Diagnosis Date  . Arthritis   . COPD (chronic obstructive pulmonary disease) ([place])   . Diverticulosis   . Essential hypertension, benign   . Hx of colonic polyps   . Hypothyroidism   . IBS (irritable bowel syndrome)   . Irritable bowel syndrome   . Ketoacidosis   . Mixed hyperlipidemia   . PTSD (post-traumatic stress disorder)   . S/P colonoscopy Jan 2011   RMR: pancolonic diverticula, polypoid rectal mucosa with  prominent lymphoid aggregates, repeat in Jan 2016   . Shortness of breath dyspnea   . Stress incontinence, female   . Thyroid disease   . Tobacco use   . Type 2 diabetes mellitus [facility])     Patient Active Problem List   Diagnosis Date Noted  . Acute respiratory failure with hypoxia ([place]) 12/01/2020  . Segmental colitis without complication ([place])   . Class 2 obesity due to excess  calories with body mass index (BMI) of 37.0 to 37.9 in adult   . Ischemic colitis ([place]) 03/19/2020  . Noncompliance with medication regimen 03/18/2020  . Acute renal failure superimposed on stage 3a chronic kidney disease ([place]) 03/18/2020  . DKA (diabetic ketoacidoses) 03/17/2020  . Bipolar II disorder ([place]) 04/17/2018  . PTSD (post-traumatic stress disorder) 04/17/2018  . Personal history of noncompliance with medical treatment, presenting hazards to health 08/17/2016  . Emesis 05/20/2016  . Uncontrolled diabetes mellitus with stage 2 chronic kidney disease ([place]) 09/08/2015  . Essential hypertension, benign 09/08/2015  . Primary hypothyroidism 09/08/2015  . Diverticulosis of colon without hemorrhage   . Nausea vomiting and diarrhea 10/27/2011  . Chest pain 06/24/2011  . Hypercholesterolemia 06/24/2011  . Tobacco abuse 06/24/2011  . IRRITABLE BOWEL SYNDROME 11/12/2009  . Diarrhea 11/12/2009  . OTHER SYMPTOMS INVOLVING DIGESTIVE SYSTEM OTHER 11/12/2009  . History of colonic polyps 11/12/2009  . CLOSED FRACTURE OF METATARSAL BONE 11/30/2007  . HIGH BLOOD PRESSURE 11/30/2007    Past Surgical History:  Procedure Laterality Date  . Bilateral tubal ligation    . BREAST BIOPSY Left 10/16/2014   negative  . CHOLECYSTECTOMY  09/11/2012   Procedure: LAPAROSCOPIC CHOLECYSTECTOMY;  Surgeon: Jamesetta So, Claudia Keller;  Location: [facility] ORS;  Service:  General;  Laterality: N/A;  . COLONOSCOPY  11/17/2009   Dr. Gala Romney: normal rectum, pancolonic diverticula, polyps benign, no adenomas.   . COLONOSCOPY N/A 02/12/2015   Dr. Gala Romney: colonic diverticulosis, multiple colonic polyps (tubular adenomas), negative segmental biopsies. Surveillance in 2021  . ESOPHAGOGASTRODUODENOSCOPY  11/23/2011   Dr. Gala Romney: Erosive reflux esophagitis; small hiatal hernia; esophagus dilated empirically  . MALONEY DILATION  11/23/2011   Procedure: Venia Minks DILATION;  Surgeon: Daneil Dolin, Claudia Keller;  Location: [facility] ENDO SUITE;  Service:  Endoscopy;  Laterality: N/A;  . SKIN LESION EXCISION     on buttocks  . TUBAL LIGATION       OB History    Gravida  4   Para  4   Term  4   Preterm      AB      Living  4     SAB      IAB      Ectopic      Multiple      Live Births              Family History  Problem Relation Age of Onset  . Schizophrenia Brother   . Stroke Brother   . Cancer Mother        unsure what type  . Heart disease Father   . Heart attack Father   . Coronary artery disease Other   . Diabetes type II Other   . Cancer Son        bladder  . Colon cancer Neg Hx     Social History   Tobacco Use  . Smoking status: Current Every Day Smoker    Packs/day: 2.00    Years: 43.00    Pack years: 86.00    Types: Cigarettes  . Smokeless tobacco: Never Used  Substance Use Topics  . Alcohol use: No    Alcohol/week: 0.0 standard drinks  . Drug use: No  Lives at home Lives alone  Home Medications Prior to Admission medications   Medication Sig Start Date End Date Taking? Authorizing Provider  albuterol (VENTOLIN HFA) 108 (90 Base) MCG/ACT inhaler Inhale 2 puffs into the lungs every 4 (four) hours as needed for wheezing or shortness of breath. 12/03/20   Manuella Ghazi, Pratik D, DO  amLODipine (NORVASC) 5 MG tablet Take 1 tablet (5 mg total) by mouth daily. 12/03/20   Manuella Ghazi, Pratik D, DO  ascorbic acid (VITAMIN C) 500 MG tablet Take 1 tablet (500 mg total) by mouth daily for 15 days. 12/04/20 12/19/20  Manuella Ghazi, Pratik D, DO  Calcium Carb-Cholecalciferol (OYSTER SHELL CALCIUM W/D) 500-200 MG-UNIT TABS Take 1 tablet by mouth 2 (two) times daily. 11/10/20   Provider, Historical, Claudia Keller  cetirizine (ZYRTEC) 10 MG tablet Take 10 mg by mouth daily. 11/10/20   Provider, Historical, Claudia Keller  colestipol (COLESTID) 1 g tablet TAKE 2 TABLETS DAILY FOR DIARRHEA. DO NOT TAKE WITHIN 2 HOURS OF OTHER ORAL MEDICATIONS. HOLD FOR CONSTIPATION. 05/23/20   Carlis Stable, NP  divalproex (DEPAKOTE ER) 500 MG 24 hr tablet Take 2 tablets  (1,000 mg total) by mouth every evening. 11/21/20   Norman Clay, Claudia Keller  divalproex (DEPAKOTE) 250 MG DR tablet Take 1 tablet (250 mg total) by mouth daily. 11/21/20   Norman Clay, Claudia Keller  furosemide (LASIX) 20 MG tablet Take 20 mg by mouth daily.    Provider, Historical, Claudia Keller  gabapentin (NEURONTIN) 100 MG capsule Take 100 mg by mouth 3 (three) times daily.    Provider, Historical,  Claudia Keller  guaiFENesin-dextromethorphan (ROBITUSSIN DM) 100-10 MG/5ML syrup Take 10 mLs by mouth every 4 (four) hours as needed for cough. 12/03/20   Manuella Ghazi, Pratik D, DO  insulin glargine (LANTUS) 100 UNIT/ML injection Inject 0.45 mLs (45 Units total) into the skin 2 (two) times daily. Patient taking differently: Inject 100 Units into the skin at bedtime. 03/20/20   Barton Dubois, Claudia Keller  insulin lispro (HUMALOG) 100 UNIT/ML injection Inject 15-24 Units into the skin 3 (three) times daily with meals. Per sliding scale. 151-200=18 units; 201-250= 9 units; 251-300=20 units; 301-350=21 units; 351-400= 22 units; 401-450=23 units; 500-551=24 units.    Provider, Historical, Claudia Keller  levothyroxine (SYNTHROID) 200 MCG tablet Take 200 mcg by mouth daily before breakfast.     Provider, Historical, Claudia Keller  linagliptin (TRADJENTA) 5 MG TABS tablet Take 5 mg by mouth daily.     Provider, Historical, Claudia Keller  lisinopril (PRINIVIL,ZESTRIL) 20 MG tablet Take 20 mg by mouth daily.    Provider, Historical, Claudia Keller  metFORMIN (GLUCOPHAGE) 500 MG tablet Take 500 mg by mouth 2 (two) times daily. 11/12/20   Provider, Historical, Claudia Keller  mirtazapine (REMERON) 7.5 MG tablet Take 1 tablet (7.5 mg total) by mouth at bedtime. 11/21/20   Norman Clay, Claudia Keller  nicotine (NICODERM CQ - DOSED IN MG/24 HOURS) 21 mg/24hr patch Place 1 patch (21 mg total) onto the skin daily. Patient not taking: No sig reported 03/21/20   Barton Dubois, Claudia Keller  NOVOLOG FLEXPEN 100 UNIT/ML FlexPen Inject 15 Units into the skin in the morning, at noon, and at bedtime. 10/13/20   Provider, Historical, Claudia Keller  ondansetron (ZOFRAN) 4  MG tablet Take 1 tablet (4 mg total) by mouth every 8 (eight) hours as needed for nausea or vomiting. 10/17/18   Carlis Stable, NP  pantoprazole (PROTONIX) 40 MG tablet TAKE 1 TABLET BY MOUTH ONCE DAILY 30 MINTUES BEFORE BREAKFAST. Patient taking differently: Take 40 mg by mouth daily before breakfast. 05/23/20   Carlis Stable, NP  PARoxetine (PAXIL) 40 MG tablet Take 1 tablet (40 mg total) by mouth daily. 11/23/20   Norman Clay, Claudia Keller  predniSONE (DELTASONE) 50 MG tablet Take 1 tablet (50 mg total) by mouth daily for 5 days. 12/04/20 12/09/20  Manuella Ghazi, Pratik D, DO  QUEtiapine (SEROQUEL) 100 MG tablet Take 1 tablet (100 mg total) by mouth at bedtime. 11/21/20   Norman Clay, Claudia Keller  simvastatin (ZOCOR) 40 MG tablet Take 40 mg by mouth at bedtime.     Provider, Historical, Claudia Keller  zinc sulfate 220 (50 Zn) MG capsule Take 1 capsule (220 mg total) by mouth daily for 15 days. 12/04/20 12/19/20  Heath Lark D, DO    Allergies    Patient has no known allergies.  Review of Systems   Review of Systems  All other systems reviewed and are negative.   Physical Exam Updated Vital Signs BP (!) 177/73   Pulse 95   Temp 97.8 F (36.6 C)   Resp (!) 23   Ht 5\' 4"  (1.626 m)   Wt 104.3 kg   SpO2 92%   BMI 39.47 kg/m   Physical Exam Vitals and nursing note reviewed.  Constitutional:      Appearance: She is obese. She is not ill-appearing or toxic-appearing.     Comments: Elderly ill kempt female who appears older than her stated age  HENT:     Head: Normocephalic and atraumatic.     Right Ear: External ear normal.     Left Ear: External ear normal.  Eyes:     Extraocular Movements: Extraocular movements intact.     Conjunctiva/sclera: Conjunctivae normal.     Pupils: Pupils are equal, round, and reactive to light.  Cardiovascular:     Rate and Rhythm: Normal rate and regular rhythm.     Pulses: Normal pulses.     Heart sounds: Normal heart sounds.  Pulmonary:     Effort: Pulmonary effort is normal. No  respiratory distress.     Breath sounds: Normal breath sounds. No stridor. No wheezing, rhonchi or rales.  Abdominal:     General: There is distension.     Palpations: Abdomen is soft.  Musculoskeletal:        General: Tenderness present.     Cervical back: Normal range of motion.  Feet:     Comments: Patient is noted to have some bruising mainly over the proximal phalanx of the left second toe.  She does not elicit a response when I palpate that area.  Her pulses are equal to the dorsalis pedis bilaterally.  The foot is not cold to touch.  Other than the bruising the foot has normal color.  There is no swelling except some mild swelling of that toe. Skin:    General: Skin is warm and dry.     Findings: Bruising present.  Neurological:     General: No focal deficit present.     Mental Status: She is oriented to person, place, and time.     Cranial Nerves: No cranial nerve deficit.  Psychiatric:        Mood and Affect: Affect is angry.        Speech: Speech normal.        Behavior: Behavior is not aggressive.       ED Results / Procedures / Treatments   Labs (all labs ordered are listed, but only abnormal results are displayed) Results for orders placed or performed during the hospital encounter of 12/06/20  Blood gas, venous  Result Value Ref Range   FIO2 90.00    pH, Ven 7.385 7.250 - 7.430   pCO2, Ven 43.0 (L) 44.0 - 60.0 mmHg   pO2, Ven <31.0 (LL) 32.0 - 45.0 mmHg   Bicarbonate 23.4 20.0 - 28.0 mmol/L   Acid-Base Excess 0.8 0.0 - 2.0 mmol/L   O2 Saturation 33.8 %   Patient temperature 37.0   Comprehensive metabolic panel  Result Value Ref Range   Sodium 136 135 - 145 mmol/L   Potassium 3.8 3.5 - 5.1 mmol/L   Chloride 100 98 - 111 mmol/L   CO2 23 22 - 32 mmol/L   Glucose, Bld 397 (H) 70 - 99 mg/dL   BUN 41 (H) 8 - 23 mg/dL   Creatinine, Ser 1.05 (H) 0.44 - 1.00 mg/dL   Calcium 8.9 8.9 - 10.3 mg/dL   Total Protein 6.0 (L) 6.5 - 8.1 g/dL   Albumin 2.6 (L) 3.5 - 5.0  g/dL   AST 52 (H) 15 - 41 U/L   ALT 42 0 - 44 U/L   Alkaline Phosphatase 67 38 - 126 U/L   Total Bilirubin 1.7 (H) 0.3 - 1.2 mg/dL   GFR, Estimated 58 (L) >60 mL/min   Anion gap 13 5 - 15  CBC with Differential  Result Value Ref Range   WBC 20.4 (H) 4.0 - 10.5 K/uL   RBC 4.34 3.87 - 5.11 MIL/uL   Hemoglobin 13.1 12.0 - 15.0 g/dL   HCT 40.7 36.0 - 46.0 %   MCV  93.8 80.0 - 100.0 fL   MCH 30.2 26.0 - 34.0 pg   MCHC 32.2 30.0 - 36.0 g/dL   RDW 14.4 11.5 - 15.5 %   Platelets 247 150 - 400 K/uL   nRBC 0.0 0.0 - 0.2 %   Neutrophils Relative % 85 %   Neutro Abs 17.6 (H) 1.7 - 7.7 K/uL   Lymphocytes Relative 3 %   Lymphs Abs 0.6 (L) 0.7 - 4.0 K/uL   Monocytes Relative 5 %   Monocytes Absolute 1.0 0.1 - 1.0 K/uL   Eosinophils Relative 1 %   Eosinophils Absolute 0.2 0.0 - 0.5 K/uL   Basophils Relative 1 %   Basophils Absolute 0.1 0.0 - 0.1 K/uL   Immature Granulocytes 5 %   Abs Immature Granulocytes 0.97 (H) 0.00 - 0.07 K/uL  Lactic acid, plasma  Result Value Ref Range   Lactic Acid, Venous 1.5 0.5 - 1.9 mmol/L   Laboratory interpretation all normal except hyperglycemia without metabolic acidosis, leukocytosis probably from steroids, malnutrition, minor elevation of 2 LFTs    EKG None  Radiology DG Chest Port 1 View  Result Date: 12/07/2020 CLINICAL DATA:  Hypoxia, positive COVID-19 test EXAM: PORTABLE CHEST 1 VIEW COMPARISON:  12/01/2020 FINDINGS: Cardiac shadow is stable. Aortic calcifications are seen. Increase in patchy airspace opacities bilaterally consistent with the given clinical history of COVID-19 positivity. No sizable effusion is seen. No bony abnormality is noted. IMPRESSION: Increasing airspace opacity bilaterally consistent with the given clinical history of COVID-19 positivity. Electronically Signed   By: Inez Catalina M.D.   On: 12/07/2020 00:33   DG Foot Complete Left  Result Date: 12/07/2020 CLINICAL DATA:  Fall 1 week ago with persistent foot pain, initial  encounter EXAM: LEFT FOOT - COMPLETE 3+ VIEW COMPARISON:  None. FINDINGS: Undisplaced fractures of the second and third proximal phalanges are noted. No other fracture is seen. No soft tissue abnormality is noted. IMPRESSION: Fractures of the second and third proximal phalanges. Electronically Signed   By: Inez Catalina M.D.   On: 12/07/2020 00:34     CT Chest Wo Contrast  Result Date: 12/01/2020 CLINICAL DATA:  Decreased oxygen saturation  IMPRESSION: 1. Multifocal airspace opacity throughout the lungs without consolidation. This appearance is felt to be indicative of atypical organism pneumonia. Correlation with COVID-19 status advised. 2. Mildly prominent aortopulmonary window and subcarinal lymph nodes likely are of reactive etiology given the underlying parenchymal lung changes. 3. Aortic atherosclerosis. Foci of great vessel and coronary artery calcification noted. 4.  Absent gallbladder. Aortic Atherosclerosis (ICD10-I70.0). Electronically Signed   By: Lowella Grip III M.D.   On: 12/01/2020 14:15    NM Pulmonary Perfusion  Result Date: 12/01/2020 CLINICAL DATA:  Shortness of breath for months. Pulmonary embolus suspected with high probability.IMPRESSION: Normal homogeneous perfusion of both lungs. Electronically Signed   By: Lucienne Capers M.D.   On: 12/01/2020 22:04   Procedures .Critical Care Performed by: Rolland Porter, Claudia Keller Authorized by: Rolland Porter, Claudia Keller   Critical care provider statement:    Critical care time (minutes):  34   Critical care was necessary to treat or prevent imminent or life-threatening deterioration of the following conditions:  Endocrine crisis and respiratory failure   Critical care was time spent personally by me on the following activities:  Examination of patient, obtaining history from patient or surrogate, review of old charts, ordering and review of radiographic studies, ordering and review of laboratory studies and pulse oximetry   Care discussed with:  admitting  provider     Medications Ordered in ED Medications  insulin aspart (novoLOG) injection 5 Units (has no administration in time range)  sodium chloride 0.9 % bolus 500 mL (0 mLs Intravenous Stopped 12/07/20 0041)    ED Course  I have reviewed the triage vital signs and the nursing notes.  Pertinent labs & imaging results that were available during my care of the patient were reviewed by me and considered in my medical decision making (see chart for details).    MDM Rules/Calculators/A&P                         X-rays were obtained of her left foot to look for underlying fracture.  Due to her hypoxia portable chest x-ray was done.  Routine laboratory testing was done due to her hyperglycemia.  Review of her chart also shows she was discharged home on steroids which may explain some of her hyperglycemia.  Review her chart shows prior to discharge on January 26 patient was ambulated by nursing staff and she maintained her oxygen at 91 to 93% on room air and she was discharged home from the hospital without oxygen.  Patient's fractured toes were buddy taped and she was placed in a postop shoe.  2:00 AM patient discussed with Dr. Josephine Cables, hospitalist he will admit.  KALAIYAH WINEMAN was evaluated in Emergency Department on 12/07/2020 for the symptoms described in the history of present illness. She was evaluated in the context of the global COVID-19 pandemic, which necessitated consideration that the patient might be at risk for infection with the SARS-CoV-2 virus that causes COVID-19. Institutional protocols and algorithms that pertain to the evaluation of patients at risk for COVID-19 are in a state of rapid change based on information released by regulatory bodies including the CDC and federal and state organizations. These policies and algorithms were followed during the patient's care in the ED.   Final Clinical Impression(s) / ED Diagnoses Final diagnoses:  Hyperglycemia  Hypoxia   Pneumonia due to COVID-19 virus  Fracture of multiple toes, left, closed, initial encounter    Rx / DC Orders  Plan admission  Rolland Porter, Claudia Keller, Barbette Or, Claudia Keller 12/07/20 Sylvester Harder    Rolland Porter, Claudia Keller 12/07/20 [phone]

## 2020-12-07 NOTE — Progress Notes (Addendum)
Patient seen and evaluated, chart reviewed, please see EMR for updated orders. Please see full H&P dictated by admitting physician Dr. Josephine Cables for same date of service.   Brief Summary:- 70 y.o. female with medical history significant for COPD, morbid obesity, hypertension, hypothyroidism, hyperlipidemia, type 2 diabetes mellitus with nephropathy and neuropathy, tobacco abuse and depression/anxiety readmitted on 12/07/2020 after being recently discharged on 12/03/2020 post treatment for Covid pneumonia    A/p  1)Acute hypoxic respiratory failure secondary to COVID-19 infection/Pneumonia--- The treatment plan and use of medications  for treatment of COVID-19 infection and possible side effects were discussed with patient --Currently requiring 5 L of oxygen via nasal cannula -----Patient verbalizes understanding and agrees to treatment protocols   --Patient is positive for COVID-19 infection, chest x-ray with findings of infiltrates/opacities,  patient is tachypneic/hypoxic and requiring continuous supplemental oxygen---patient meets criteria for initiation of Remdesivir AND Steroid therapy per protocol  --Check and trend inflammatory markers including D-dimer, ferritin and  CRP---also follow CBC and CMP --Supplemental oxygen to keep O2 sats above 93% -Follow serial chest x-rays and ABGs as indicated --- Encourage prone positioning for More than 16 hours/day in increments of 2 to 3 hours at a time if able to tolerate --Attempt to maintain euvolemic state --Zinc and vitamin C as ordered -Albuterol inhaler as needed -Accu-Cheks/fingersticks while on high-dose steroids -PPI while on high-dose steroids -Continue IV Solu-Medrol, baricitinib, bronchodilators -Patient recently treated with IV remdesivir  2)New onset A. fib with RVR in setting of acute hypoxic respiratory failure secondary to Covid infection/pneumonia--- IV Cardizem as ordered, echo with preserved EF of 65 to 70%, -Check TSH -No prior  history of A. fib, will need anticoagulation if A. fib persist  3)DM2-A1c is 10.2 reflecting uncontrolled DM with hyperglycemia PTA -Anticipate further worsening hyperglycemia with steroids -Increase Levemir to 20 units twice daily -Continue linagliptin and sliding scale insulin coverage  4) hypothyroidism-continue levothyroxine, check TSH especially due to new onset A. Fib  5) bipolar disorder--- stable, continues Seroquel, Paxil and Remeron  6) tobacco abuse and COPD--smokes 2 packs daily, give nicotine patch -Steroids and bronchodilators as above #1  7)HTN--- continue lisinopril 20 mg daily stop amlodipine in order to allow for IV Cardizem use for A. fib as above #2  8) social/ethics--- lives alone, may need home health services post discharge, she is a full code  9) second and third phalanges fracture of the left foot--- conservative management  10) leukocytosis--- suspect secondary to steroid therapy   -Total care time is over 42 minutes  Patient seen and evaluated, chart reviewed, please see EMR for updated orders. Please see full H&P dictated by admitting physician Dr. Josephine Cables for same date of service.   Roxan Hockey, MD

## 2020-12-07 NOTE — ED Notes (Signed)
Date and time results received: 12/07/20 0105  Test: VBG Critical Value: <31.0  Name of Provider Notified: Rolland Porter, MD  Orders Received? Or Actions Taken?: n/a

## 2020-12-08 ENCOUNTER — Other Ambulatory Visit: Payer: Self-pay

## 2020-12-08 ENCOUNTER — Other Ambulatory Visit: Payer: Self-pay | Admitting: *Deleted

## 2020-12-08 LAB — COMPREHENSIVE METABOLIC PANEL
ALT: 42 U/L (ref 0–44)
AST: 61 U/L — ABNORMAL HIGH (ref 15–41)
Albumin: 2.6 g/dL — ABNORMAL LOW (ref 3.5–5.0)
Alkaline Phosphatase: 67 U/L (ref 38–126)
Anion gap: 11 (ref 5–15)
BUN: 33 mg/dL — ABNORMAL HIGH (ref 8–23)
CO2: 29 mmol/L (ref 22–32)
Calcium: 10.1 mg/dL (ref 8.9–10.3)
Chloride: 103 mmol/L (ref 98–111)
Creatinine, Ser: 0.67 mg/dL (ref 0.44–1.00)
GFR, Estimated: >60 mL/min (ref >60)
Glucose, Bld: 134 mg/dL — ABNORMAL HIGH (ref 70–99)
Potassium: 4.1 mmol/L (ref 3.5–5.1)
Sodium: 143 mmol/L (ref 135–145)
Total Bilirubin: 0.8 mg/dL (ref 0.3–1.2)
Total Protein: 6.4 g/dL — ABNORMAL LOW (ref 6.5–8.1)

## 2020-12-08 LAB — C-REACTIVE PROTEIN: CRP: 5 mg/dL — ABNORMAL HIGH (ref <1.0)

## 2020-12-08 LAB — MAGNESIUM: Magnesium: 1.7 mg/dL (ref 1.7–2.4)

## 2020-12-08 LAB — GLUCOSE, CAPILLARY
Glucose-Capillary: 266 mg/dL — ABNORMAL HIGH (ref 70–99)
Glucose-Capillary: 352 mg/dL — ABNORMAL HIGH (ref 70–99)

## 2020-12-08 LAB — CBC WITH DIFFERENTIAL/PLATELET
Abs Immature Granulocytes: 0.72 10*3/uL — ABNORMAL HIGH (ref 0.00–0.07)
Basophils Absolute: 0.1 10*3/uL (ref 0.0–0.1)
Basophils Relative: 0 %
Eosinophils Absolute: 0 10*3/uL (ref 0.0–0.5)
Eosinophils Relative: 0 %
HCT: 41 % (ref 36.0–46.0)
Hemoglobin: 13.3 g/dL (ref 12.0–15.0)
Immature Granulocytes: 4 %
Lymphocytes Relative: 5 %
Lymphs Abs: 0.8 10*3/uL (ref 0.7–4.0)
MCH: 30.2 pg (ref 26.0–34.0)
MCHC: 32.4 g/dL (ref 30.0–36.0)
MCV: 93.2 fL (ref 80.0–100.0)
Monocytes Absolute: 0.7 10*3/uL (ref 0.1–1.0)
Monocytes Relative: 4 %
Neutro Abs: 14.4 10*3/uL — ABNORMAL HIGH (ref 1.7–7.7)
Neutrophils Relative %: 87 %
Platelets: 232 10*3/uL (ref 150–400)
RBC: 4.4 MIL/uL (ref 3.87–5.11)
RDW: 14.4 % (ref 11.5–15.5)
WBC: 16.7 10*3/uL — ABNORMAL HIGH (ref 4.0–10.5)
nRBC: 0 % (ref 0.0–0.2)

## 2020-12-08 LAB — PHOSPHORUS: Phosphorus: 2.5 mg/dL (ref 2.5–4.6)

## 2020-12-08 LAB — D-DIMER, QUANTITATIVE: D-Dimer, Quant: 2.13 ug/mL-FEU — ABNORMAL HIGH (ref 0.00–0.50)

## 2020-12-08 LAB — TSH: TSH: 0.049 u[IU]/mL — ABNORMAL LOW (ref 0.350–4.500)

## 2020-12-08 LAB — FERRITIN: Ferritin: 227 ng/mL (ref 11–307)

## 2020-12-08 MED ORDER — NICOTINE 21 MG/24HR TD PT24
21.0000 mg | MEDICATED_PATCH | Freq: Every day | TRANSDERMAL | Status: DC
  Administered 2020-12-08 – 2020-12-11 (×4): 21 mg via TRANSDERMAL
  Filled 2020-12-08 (×5): qty 1

## 2020-12-08 MED ORDER — ATORVASTATIN CALCIUM 20 MG PO TABS
20.0000 mg | ORAL_TABLET | Freq: Every day | ORAL | Status: DC
  Administered 2020-12-08 – 2020-12-11 (×4): 20 mg via ORAL
  Filled 2020-12-08 (×4): qty 1

## 2020-12-08 MED ORDER — DILTIAZEM HCL 30 MG PO TABS
30.0000 mg | ORAL_TABLET | Freq: Four times a day (QID) | ORAL | Status: DC
  Administered 2020-12-08 – 2020-12-12 (×17): 30 mg via ORAL
  Filled 2020-12-08 (×16): qty 1

## 2020-12-08 NOTE — NC FL2 (Signed)
[place] LEVEL OF CARE SCREENING TOOL     IDENTIFICATION  Patient Name: Claudia Keller Birthdate: [birth year] Sex: female Admission Date (Current Location): 12/06/2020  [facility] and [place] Number:  [employer] and Address:  [facility] [address], [place]      Provider Number: [id number]  Attending Physician Name and Address:  Roxan Hockey, MD  Relative Name and Phone Number:  Reyne Dumas - son [phone]    Current Level of Care: Hospital Recommended Level of Care: [place] Prior Approval Number:    Date Approved/Denied:   PASRR Number: [id number]  Discharge Plan: SNF    Current Diagnoses: Patient Active Problem List   Diagnosis Date Noted  . Pneumonia due to COVID-19 virus 12/07/2020  . Leukocytosis 12/07/2020  . Hyperglycemia due to diabetes mellitus ([place]) 12/07/2020  . Hypoalbuminemia 12/07/2020  . Elevated AST (SGOT) 12/07/2020  . Obesity (BMI 30-39.9) 12/07/2020  . COPD (chronic obstructive pulmonary disease) ([place]) 12/07/2020  . GERD (gastroesophageal reflux disease) 12/07/2020  . Diabetic neuropathy ([place]) 12/07/2020  . Depression 12/07/2020  . Anxiety 12/07/2020  . Acute respiratory disease due to COVID-19 virus 12/07/2020  . Acute respiratory failure with hypoxia ([place]) 12/01/2020  . Segmental colitis without complication ([place])   . Class 2 obesity due to excess calories with body mass index (BMI) of 37.0 to 37.9 in adult   . Ischemic colitis ([place]) 03/19/2020  . Noncompliance with medication regimen 03/18/2020  . Acute renal failure superimposed on stage 3a chronic kidney disease ([place]) 03/18/2020  . DKA (diabetic ketoacidoses) 03/17/2020  . Bipolar II disorder ([place]) 04/17/2018  . PTSD (post-traumatic stress disorder) 04/17/2018  . Personal history of noncompliance with medical treatment, presenting hazards to health 08/17/2016  . Emesis 05/20/2016  . Uncontrolled diabetes mellitus  with stage 2 chronic kidney disease ([place]) 09/08/2015  . Essential hypertension, benign 09/08/2015  . Primary hypothyroidism 09/08/2015  . Diverticulosis of colon without hemorrhage   . Nausea vomiting and diarrhea 10/27/2011  . Chest pain 06/24/2011  . Hypercholesterolemia 06/24/2011  . Tobacco abuse 06/24/2011  . IRRITABLE BOWEL SYNDROME 11/12/2009  . Diarrhea 11/12/2009  . OTHER SYMPTOMS INVOLVING DIGESTIVE SYSTEM OTHER 11/12/2009  . History of colonic polyps 11/12/2009  . Closed fracture of metatarsal bone 11/30/2007  . HIGH BLOOD PRESSURE 11/30/2007    Orientation RESPIRATION BLADDER Height & Weight     Self,Time,Situation,Place  O2 (2L) External catheter Weight: 73.3 kg Height:  5\' 4"  (162.6 cm)  BEHAVIORAL SYMPTOMS/MOOD NEUROLOGICAL BOWEL NUTRITION STATUS      Continent Diet (DC summary)  AMBULATORY STATUS COMMUNICATION OF NEEDS Skin   Extensive Assist Verbally Skin abrasions (Generalized)                       Personal Care Assistance Level of Assistance  Bathing,Feeding,Dressing Bathing Assistance: Maximum assistance Feeding assistance: Independent Dressing Assistance: Limited assistance     Functional Limitations Info  Sight,Hearing,Speech Sight Info: Impaired Hearing Info: Adequate Speech Info: Adequate    SPECIAL CARE FACTORS FREQUENCY  PT (By licensed PT)     PT Frequency: 5 times a week              Contractures Contractures Info: Not present    Additional Factors Info  Code Status,Allergies Code Status Info: Full Allergies Info: NKDA           Current Medications (12/08/2020):  This is the current hospital active medication list Current Facility-Administered  Medications  Medication Dose Route Frequency Provider Last Rate Last Admin  . acetaminophen (TYLENOL) tablet 650 mg  650 mg Oral Q6H PRN Adefeso, Oladapo, DO   650 mg at 12/08/20 0536  . albuterol (VENTOLIN HFA) 108 (90 Base) MCG/ACT inhaler 2 puff  2 puff Inhalation TID Roxan Hockey, MD   2 puff at 12/08/20 1452  . ascorbic acid (VITAMIN C) tablet 500 mg  500 mg Oral Daily Adefeso, Oladapo, DO   500 mg at 12/08/20 0927  . atorvastatin (LIPITOR) tablet 20 mg  20 mg Oral QHS Emokpae, Courage, MD      . baricitinib (OLUMIANT) tablet 4 mg  4 mg Oral Daily Emokpae, Courage, MD   4 mg at 12/08/20 0927  . chlorpheniramine-HYDROcodone (TUSSIONEX) 10-8 MG/5ML suspension 5 mL  5 mL Oral Q12H PRN Adefeso, Oladapo, DO      . diltiazem (CARDIZEM) tablet 30 mg  30 mg Oral Q6H Emokpae, Courage, MD   30 mg at 12/08/20 0927  . enoxaparin (LOVENOX) injection 60 mg  60 mg Subcutaneous Q24H Emokpae, Courage, MD   60 mg at 12/08/20 0927  . feeding supplement (GLUCERNA SHAKE) (GLUCERNA SHAKE) liquid 237 mL  237 mL Oral TID BM Adefeso, Oladapo, DO   237 mL at 12/08/20 0931  . gabapentin (NEURONTIN) capsule 100 mg  100 mg Oral TID Adefeso, Oladapo, DO   100 mg at 12/08/20 1505  . guaiFENesin-dextromethorphan (ROBITUSSIN DM) 100-10 MG/5ML syrup 10 mL  10 mL Oral Q4H PRN Adefeso, Oladapo, DO      . insulin aspart (novoLOG) injection 0-15 Units  0-15 Units Subcutaneous TID WC Adefeso, Oladapo, DO   8 Units at 12/07/20 1436  . insulin aspart (novoLOG) injection 0-5 Units  0-5 Units Subcutaneous QHS Adefeso, Oladapo, DO      . insulin aspart (novoLOG) injection 6 Units  6 Units Subcutaneous TID WC Adefeso, Oladapo, DO   6 Units at 12/07/20 1436  . insulin detemir (LEVEMIR) injection 20 Units  20 Units Subcutaneous BID Roxan Hockey, MD   20 Units at 12/07/20 2252  . linagliptin (TRADJENTA) tablet 5 mg  5 mg Oral Daily Adefeso, Oladapo, DO   5 mg at 12/08/20 1025  . lisinopril (ZESTRIL) tablet 20 mg  20 mg Oral Daily Adefeso, Oladapo, DO   20 mg at 12/08/20 1025  . methylPREDNISolone sodium succinate (SOLU-MEDROL) 125 mg/2 mL injection 51.875 mg  0.5 mg/kg Intravenous Q12H Adefeso, Oladapo, DO   51.875 mg at 12/08/20 1505   Followed by  . [START ON 12/10/2020] predniSONE (DELTASONE) tablet 50 mg   50 mg Oral Daily Adefeso, Oladapo, DO      . mirtazapine (REMERON) tablet 7.5 mg  7.5 mg Oral QHS Adefeso, Oladapo, DO   7.5 mg at 12/07/20 2249  . pantoprazole (PROTONIX) injection 40 mg  40 mg Intravenous QHS Adefeso, Oladapo, DO   40 mg at 12/07/20 2252  . PARoxetine (PAXIL) tablet 40 mg  40 mg Oral Daily Adefeso, Oladapo, DO   40 mg at 12/08/20 1025  . QUEtiapine (SEROQUEL) tablet 100 mg  100 mg Oral QHS Adefeso, Oladapo, DO   100 mg at 12/07/20 2248  . zinc sulfate capsule 220 mg  220 mg Oral Daily Adefeso, Oladapo, DO   220 mg at 12/08/20 1025     Discharge Medications: Please see discharge summary for a list of discharge medications.  Relevant Imaging Results:  Relevant Lab Results:   Additional Information SS# [id number]  Boneta Lucks, RN

## 2020-12-08 NOTE — Evaluation (Signed)
Physical Therapy Evaluation Patient Details Name: LATONYA NELON MRN: [id number] DOB: [birth year] Today's Date: 12/08/2020   History of Present Illness  ERDINE HULEN is a 70 y.o. female with medical history significant for COPD, morbid obesity, hypertension, hypothyroidism, hyperlipidemia, type 2 diabetes mellitus with nephropathy and neuropathy, tobacco abuse and depression/anxiety who presents to the emergency department due to left foot pain and elevated blood glucose level.  Patient was recently admitted and discharged  from 1/24-1/26 due to acute respiratory failure with hypoxia secondary to COVID-19 pneumonia and uncontrolled type 2 diabetes mellitus with A1c of 10.2%.  She was treated with remdesivir for 3 days and she did not require home oxygen on discharge.  Patient states that she slipped at home and hurt her foot when she presented to the emergency department on December 01, 2020 and states that the foot was not addressed during the admission, she complained of left foot pain at home.  She also states that she has had difficulty in being able to control her blood glucose level at home.  When asked if she has been compliant with her medications, she states that she tries her best.  She lives alone and she activated EMS, who reported that pulse ox was 82% on room air and a CBG prior to arrival was 526.    Clinical Impression  Patient demonstrates slow labored movement for sitting up at bedside, required Mod assist to don/doff left post op shoe, at high risk for falls due to fair/poo standing balance, limited to a few steps at bedside before having to sit mostly due to c/o fatigue/generalized weakness and SpO2 dropping from 93% to 85% while on 5 LPM O2.  Patient put back to bed after therapy.  Patient will benefit from continued physical therapy in hospital and recommended venue below to increase strength, balance, endurance for safe ADLs and gait.     Follow Up Recommendations SNF     Equipment Recommendations  None recommended by PT    Recommendations for Other Services       Precautions / Restrictions Precautions Precautions: Fall Restrictions Weight Bearing Restrictions: No      Mobility  Bed Mobility Overal bed mobility: Needs Assistance Bed Mobility: Supine to Sit;Sit to Supine     Supine to sit: Min assist;Mod assist Sit to supine: Min assist   General bed mobility comments: increased time, labored movement    Transfers Overall transfer level: Needs assistance Equipment used: Rolling walker (2 wheeled) Transfers: Sit to/from [facility] Sit to Stand: Min assist Stand pivot transfers: Min assist       General transfer comment: increased time, labored movement  Ambulation/Gait Ambulation/Gait assistance: Mod assist;Min assist Gait Distance (Feet): 6 Feet Assistive device: Rolling walker (2 wheeled) Gait Pattern/deviations: Decreased step length - right;Decreased step length - left;Decreased stride length Gait velocity: decreased   General Gait Details: limited to 6-7 steps forward/backward at bedside before having to sit due to fatigue and SpO2 dropping from 93% to 85% while on 5 LPM O2  Stairs            Wheelchair Mobility    Modified Rankin (Stroke Patients Only)       Balance Overall balance assessment: Needs assistance Sitting-balance support: Feet supported;No upper extremity supported Sitting balance-Leahy Scale: Fair Sitting balance - Comments: fair/good seated at EOB   Standing balance support: During functional activity;Bilateral upper extremity supported Standing balance-Leahy Scale: Fair Standing balance comment: using RW  Pertinent Vitals/Pain Pain Assessment: Faces Faces Pain Scale: Hurts little more Pain Location: left fore foot Pain Descriptors / Indicators: Sore;Discomfort;Grimacing Pain Intervention(s): Limited activity within patient's  tolerance;Monitored during session;Repositioned    Home Living Family/patient expects to be discharged to:: Private residence Living Arrangements: Alone Available Help at Discharge: Family;Personal care attendant;Available PRN/intermittently Type of Home: Apartment Home Access: Elevator     Home Layout: One level Home Equipment: Cane - single point;Walker - 4 wheels;Bedside commode;Shower seat      Prior Function Level of Independence: Needs assistance   Gait / Transfers Assistance Needed: household Geologist, engineering  ADL's / Homemaking Assistance Needed: assisted by family        Hand Dominance        Extremity/Trunk Assessment   Upper Extremity Assessment Upper Extremity Assessment: Generalized weakness    Lower Extremity Assessment Lower Extremity Assessment: Generalized weakness;LLE deficits/detail LLE Deficits / Details: grossly 3+/5 LLE: Unable to fully assess due to pain LLE Sensation: WNL LLE Coordination: WNL    Cervical / Trunk Assessment Cervical / Trunk Assessment: Normal  Communication   Communication: No difficulties  Cognition Arousal/Alertness: Awake/alert Behavior During Therapy: WFL for tasks assessed/performed Overall Cognitive Status: Within Functional Limits for tasks assessed                                        General Comments      Exercises     Assessment/Plan    PT Assessment Patient needs continued PT services  PT Problem List Decreased strength;Decreased activity tolerance;Decreased balance;Decreased mobility       PT Treatment Interventions DME instruction;Gait training;Stair training;Functional mobility training;Therapeutic activities;Therapeutic exercise;Patient/family education;Balance training    PT Goals (Current goals can be found in the Care Plan section)  Acute Rehab PT Goals Patient Stated Goal: return home after rehab PT Goal Formulation: With patient Time For Goal Achievement:  12/22/20 Potential to Achieve Goals: Good    Frequency Min 3X/week   Barriers to discharge        Co-evaluation               AM-PAC PT "6 Clicks" Mobility  Outcome Measure Help needed turning from your back to your side while in a flat bed without using bedrails?: A Little Help needed moving from lying on your back to sitting on the side of a flat bed without using bedrails?: A Little Help needed moving to and from a bed to a chair (including a wheelchair)?: A Little Help needed standing up from a chair using your arms (e.g., wheelchair or bedside chair)?: A Little Help needed to walk in hospital room?: A Lot Help needed climbing 3-5 steps with a railing? : A Lot 6 Click Score: 16    End of Session Equipment Utilized During Treatment: Oxygen Activity Tolerance: Patient tolerated treatment well;Patient limited by fatigue Patient left: in bed;with call bell/phone within reach Nurse Communication: Mobility status PT Visit Diagnosis: Other abnormalities of gait and mobility (R26.89);Unsteadiness on feet (R26.81);Muscle weakness (generalized) (M62.81)    Time: 2202-5427 PT Time Calculation (min) (ACUTE ONLY): 32 min   Charges:   PT Evaluation $PT Eval Moderate Complexity: 1 Mod PT Treatments $Therapeutic Activity: 23-37 mins        3:11 PM, 12/08/20 Lonell Grandchild, MPT Physical Therapist with [facility] [phone] office [phone] mobile phone

## 2020-12-08 NOTE — Plan of Care (Signed)
  Problem: Acute Rehab PT Goals(only PT should resolve) Goal: Pt Will Go Supine/Side To Sit Outcome: Progressing Flowsheets (Taken 12/08/2020 1514) Pt will go Supine/Side to Sit:  with supervision  with min guard assist Goal: Patient Will Transfer Sit To/From Stand Outcome: Progressing Flowsheets (Taken 12/08/2020 1514) Patient will transfer sit to/from stand:  with min guard assist  with minimal assist Goal: Pt Will Transfer Bed To Chair/Chair To Bed Outcome: Progressing Flowsheets (Taken 12/08/2020 1514) Pt will Transfer Bed to Chair/Chair to Bed:  min guard assist  with min assist Goal: Pt Will Ambulate Outcome: Progressing Flowsheets (Taken 12/08/2020 1514) Pt will Ambulate:  25 feet  with minimal assist  with rolling walker  with min guard assist   3:14 PM, 12/08/20 Lonell Grandchild, MPT Physical Therapist with [facility] [phone] office [phone] mobile phone

## 2020-12-08 NOTE — Progress Notes (Signed)
Patient Demographics:    Claudia Keller, is a 70 y.o. female, DOB - 06-27-1951, [id number]  Admit date - 12/06/2020   Admitting Physician Loria Lacina Denton Brick, MD  Outpatient Primary MD for the patient is Rosita Fire, MD  LOS - 1   Chief Complaint  Patient presents with  . Hyperglycemia  . Shortness of Breath        Subjective:    Claudia Keller today has no fevers, no emesis,  No chest pain,  Cough and shortness of breath and hypoxia persist-- --rate control improving on IV Cardizem drip--no palpitations or dizziness  Assessment  & Plan :    Principal Problem:   Pneumonia due to COVID-19 virus Active Problems:   Primary hypothyroidism   Closed fracture of metatarsal bone   Hypercholesterolemia   Tobacco abuse   Essential hypertension, benign   Noncompliance with medication regimen   Acute respiratory failure with hypoxia (HCC)   Leukocytosis   Hyperglycemia due to diabetes mellitus (HCC)   Hypoalbuminemia   Elevated AST (SGOT)   Obesity (BMI 30-39.9)   COPD (chronic obstructive pulmonary disease) (HCC)   GERD (gastroesophageal reflux disease)   Diabetic neuropathy (HCC)   Depression   Anxiety   Acute respiratory disease due to COVID-19 virus   Brief Summary:- 70 y.o.femalewith medical history significant forCOPD, morbid obesity, hypertension, hypothyroidism, hyperlipidemia, type 2 diabetes mellitus with nephropathy and neuropathy, tobacco abuse and depression/anxiety readmitted on 12/07/2020 after being recently discharged on 12/03/2020 post treatment for Covid pneumonia    A/p  1)Acute hypoxic respiratory failure secondary to COVID-19 infection/Pneumonia--- The treatment plan and use of medications for treatment of COVID-19 infectionand possible side effects were discussed with patient --Currently requiring 5 L of oxygen via nasal cannula -----Patient verbalizes understanding  and agrees to treatment protocols  --Patient is positive for COVID-19 infection, chest x-ray with findings of infiltrates/opacities,  patient is tachypneic/hypoxic and requiring continuous supplemental oxygen---patient meets criteria for initiation of Remdesivir AND Steroid therapy per protocol  --Check and trend inflammatory markers including D-dimer, ferritin and  CRP---also follow CBC and CMP --Supplemental oxygen to keep O2 sats above 93% -Follow serial chest x-rays and ABGs as indicated --- Encourage prone positioning for More than 16 hours/day in increments of 2 to 3 hours at a time if able to tolerate --Attempt to maintain euvolemic state --Zinc and vitamin C as ordered -Albuterol inhaler as needed -Accu-Cheks/fingersticks while on high-dose steroids -PPI while on high-dose steroids -Continue IV Solu-Medrol, baricitinib, bronchodilators -Patient recently treated with IV remdesivir  2)New onset A. fib with RVR in setting of acute hypoxic respiratory failure secondary to Covid infection/pneumonia---- echo with preserved EF of 65 to 70%, -TSH is 0.049 reflecting over replacement of thyroid-- hold levothyroxine -No prior history of A. fib, will need anticoagulation if A. fib persist -Rate control is improved on IV Cardizem, will transition to p.o. Cardizem  3)DM2-A1c is 10.2 reflecting uncontrolled DM with hyperglycemia PTA -Anticipate further worsening hyperglycemia with steroids C/n  Levemir to 20 units twice daily -Continue linagliptin and sliding scale insulin coverage  4) hypothyroidism--TSH is 0.049 reflecting over replacement of thyroid--with hold levothyroxine  5) bipolar disorder--- stable, continues Seroquel, Paxil and Remeron  6) tobacco abuse and COPD--smokes 2 packs daily, give nicotine patch -  Steroids and bronchodilators as above #1  7)HTN--- continue lisinopril 20 mg daily stop amlodipine in order to allow for  Cardizem use for A. fib as above #2  8)  social/ethics--- lives alone,   she is a full code  9) second and third phalanges fracture of the left foot--- conservative management  10) leukocytosis--- suspect secondary to steroid therapy  11) generalized weakness and deconditioning--- PT eval appreciated recommends SNF rehab  12) COPD and tobacco abuse--patient smokes 2 packs a day, try nicotine patch  Disposition/Need for in-Hospital Stay- patient unable to be discharged at this time due to hypoxic respiratory failure secondary to Covid pneumonia requiring IV Solu-Medrol, A. fib with RVR requiring further rate control, unsafe discharge plan patient needs to go to SNF rehab cannot return home*  Status is: Inpatient  Remains inpatient appropriate because:Please see above   Disposition: The patient is from: Home              Anticipated d/c is to: SNF              Anticipated d/c date is: 1 day              Patient currently is not medically stable to d/c. Barriers: Not Clinically Stable-   Code Status :  -  Code Status: Full Code   Family Communication:    NA (patient is alert, awake and coherent)   Consults  :  na  DVT Prophylaxis  :   - SCDs *  SCDs Start: 12/07/20 0207    Lab Results  Component Value Date   PLT 232 12/08/2020    Inpatient Medications  Scheduled Meds: . albuterol  2 puff Inhalation TID  . vitamin C  500 mg Oral Daily  . atorvastatin  20 mg Oral QHS  . baricitinib  4 mg Oral Daily  . diltiazem  30 mg Oral Q6H  . enoxaparin (LOVENOX) injection  60 mg Subcutaneous Q24H  . feeding supplement (GLUCERNA SHAKE)  237 mL Oral TID BM  . gabapentin  100 mg Oral TID  . insulin aspart  0-15 Units Subcutaneous TID WC  . insulin aspart  0-5 Units Subcutaneous QHS  . insulin aspart  6 Units Subcutaneous TID WC  . insulin detemir  20 Units Subcutaneous BID  . linagliptin  5 mg Oral Daily  . lisinopril  20 mg Oral Daily  . methylPREDNISolone (SOLU-MEDROL) injection  0.5 mg/kg Intravenous Q12H    Followed by  . [START ON 12/10/2020] predniSONE  50 mg Oral Daily  . mirtazapine  7.5 mg Oral QHS  . nicotine  21 mg Transdermal Daily  . pantoprazole (PROTONIX) IV  40 mg Intravenous QHS  . PARoxetine  40 mg Oral Daily  . QUEtiapine  100 mg Oral QHS  . zinc sulfate  220 mg Oral Daily   Continuous Infusions: PRN Meds:.acetaminophen, chlorpheniramine-HYDROcodone, guaiFENesin-dextromethorphan    Anti-infectives (From admission, onward)   None        Objective:   Vitals:   12/08/20 1228 12/08/20 1423 12/08/20 1452 12/08/20 1834  BP: 128/63 (!) 161/59  (!) 148/76  Pulse: 85   86  Resp: 20 18  18   Temp:  97.7 F (36.5 C)  97.8 F (36.6 C)  TempSrc:  Oral  Oral  SpO2: 95% 97% 94% 94%  Weight:  73.3 kg    Height:  5\' 4"  (1.626 m)      Wt Readings from Last 3 Encounters:  12/08/20 73.3 kg  12/01/20 104.3 kg  03/19/20 104.1 kg     Intake/Output Summary (Last 24 hours) at 12/08/2020 1942 Last data filed at 12/08/2020 1156 Gross per 24 hour  Intake 57 ml  Output --  Net 57 ml   Physical Exam  Gen:- Awake Alert,  In no apparent distress  HEENT:- [place].AT, No sclera icterus Nose- [place] 5L/min Neck-Supple Neck,No JVD,.  Lungs-diminished breath sounds, scattered rhonchi CV- S1, S2 normal, regular  Abd-  +ve B.Sounds, Abd Soft, No tenderness,    Extremity/Skin:- No  edema, pedal pulses present  Psych-affect is appropriate, oriented x3 Neuro-Generalized weakness, no new focal deficits, no tremors   Data Review:   Micro Results Recent Results (from the past 240 hour(s))  SARS CORONAVIRUS 2 (TAT 6-24 HRS) Nasopharyngeal Nasopharyngeal Swab     Status: Abnormal   Collection Time: 12/01/20 10:35 AM   Specimen: Nasopharyngeal Swab  Result Value Ref Range Status   SARS Coronavirus 2 POSITIVE (A) NEGATIVE Final    Comment: (NOTE) SARS-CoV-2 target nucleic acids are DETECTED.  The SARS-CoV-2 RNA is generally detectable in upper and lower respiratory specimens during the acute  phase of infection. Positive results are indicative of the presence of SARS-CoV-2 RNA. Clinical correlation with patient history and other diagnostic information is  necessary to determine patient infection status. Positive results do not rule out bacterial infection or co-infection with other viruses.  The expected result is Negative.  Fact Sheet for Patients: SugarRoll.be  Fact Sheet for Healthcare Providers: https://www.woods-mathews.com/  This test is not yet approved or cleared by the [place] FDA and  has been authorized for detection and/or diagnosis of SARS-CoV-2 by FDA under an Emergency Use Authorization (EUA). This EUA will remain  in effect (meaning this test can be used) for the duration of the COVID-19 declaration under Section 564(b)(1) of the Act, 21 U. S.C. section 360bbb-3(b)(1), unless the authorization is terminated or revoked sooner.   Performed at [facility], [place] [address], [place], [place] [address]    Radiology Reports CT Chest Wo Contrast  Result Date: 12/01/2020 CLINICAL DATA:  Decreased oxygen saturation EXAM: CT CHEST WITHOUT CONTRAST TECHNIQUE: Multidetector CT imaging of the chest was performed following the standard protocol without IV contrast. COMPARISON:  Chest radiograph December 01, 2020 FINDINGS: Cardiovascular: There is no thoracic aortic aneurysm. There are foci of great vessel calcification. There are foci aortic atherosclerosis as well as multiple foci of coronary artery calcification. A small amount of pleural fluid is within physiologic range. No pericardial thickening evident. Mediastinum/Nodes: Thyroid is diminutive. No thyroid lesions are evident. There is an aortopulmonary window lymph node measuring 1.2 x 1.0 cm. There is a subcarinal lymph node measuring 1.3 x 1.2 cm. There are scattered subcentimeter mediastinal and axillary lymph nodes elsewhere. There is no appreciable esophageal  lesion. Lungs/Pleura: There is scarring in the apices. There are scattered areas of ill-defined airspace opacity throughout the lungs bilaterally without consolidation. No pleural effusion. No pneumothorax. Trachea and major bronchial structures appear patent. Upper Abdomen: There is upper abdominal aortic atherosclerosis. Gallbladder is absent. Visualized upper abdominal structures otherwise appear unremarkable. Musculoskeletal: No blastic or lytic bone lesions. No evident chest wall lesions. IMPRESSION: 1. Multifocal airspace opacity throughout the lungs without consolidation. This appearance is felt to be indicative of atypical organism pneumonia. Correlation with COVID-19 status advised. 2. Mildly prominent aortopulmonary window and subcarinal lymph nodes likely are of reactive etiology given the underlying parenchymal lung changes. 3. Aortic atherosclerosis. Foci of great vessel and  coronary artery calcification noted. 4.  Absent gallbladder. Aortic Atherosclerosis (ICD10-I70.0). Electronically Signed   By: Lowella Grip III M.D.   On: 12/01/2020 14:15   NM Pulmonary Perfusion  Result Date: 12/01/2020 CLINICAL DATA:  Shortness of breath for months. Pulmonary embolus suspected with high probability. EXAM: NUCLEAR MEDICINE PERFUSION LUNG SCAN TECHNIQUE: Perfusion images were obtained in multiple projections after intravenous injection of radiopharmaceutical. Ventilation scans intentionally deferred if perfusion scan and chest x-ray adequate for interpretation during COVID 19 epidemic. RADIOPHARMACEUTICALS:  4.3 mCi Tc-57m MAA IV COMPARISON:  CT chest 12/01/2020.  Chest radiograph 12/01/2020. FINDINGS: Perfusion studies obtained without corresponding ventilation study. Perfusion study demonstrates normal homogeneous uptake of activity throughout both lungs. No focal perfusion defects are identified. Appearance suggest very low probability of pulmonary embolus. IMPRESSION: Normal homogeneous perfusion of  both lungs. Electronically Signed   By: Lucienne Capers M.D.   On: 12/01/2020 22:04   DG Chest Port 1 View  Result Date: 12/07/2020 CLINICAL DATA:  Hypoxia, positive COVID-19 test EXAM: PORTABLE CHEST 1 VIEW COMPARISON:  12/01/2020 FINDINGS: Cardiac shadow is stable. Aortic calcifications are seen. Increase in patchy airspace opacities bilaterally consistent with the given clinical history of COVID-19 positivity. No sizable effusion is seen. No bony abnormality is noted. IMPRESSION: Increasing airspace opacity bilaterally consistent with the given clinical history of COVID-19 positivity. Electronically Signed   By: Inez Catalina M.D.   On: 12/07/2020 00:33   DG Chest Port 1 View  Result Date: 12/01/2020 CLINICAL DATA:  Shortness of breath and hypoxia EXAM: PORTABLE CHEST 1 VIEW COMPARISON:  11/30/2017 FINDINGS: Borderline cardiomegaly likely accentuated by mediastinal fat. Interstitial coarsening and hazy density at the bases. No effusion, consolidation, or pneumothorax. Artifact from EKG leads. IMPRESSION: Hazy density in the lower lungs which could be atypical infection or vascular congestion. Electronically Signed   By: Monte Fantasia M.D.   On: 12/01/2020 10:30   DG Foot Complete Left  Result Date: 12/07/2020 CLINICAL DATA:  Fall 1 week ago with persistent foot pain, initial encounter EXAM: LEFT FOOT - COMPLETE 3+ VIEW COMPARISON:  None. FINDINGS: Undisplaced fractures of the second and third proximal phalanges are noted. No other fracture is seen. No soft tissue abnormality is noted. IMPRESSION: Fractures of the second and third proximal phalanges. Electronically Signed   By: Inez Catalina M.D.   On: 12/07/2020 00:34   ECHOCARDIOGRAM LIMITED  Result Date: 12/07/2020    ECHOCARDIOGRAM LIMITED REPORT   Patient Name:   LUCIE EDGECOMBE Date of Exam: 12/07/2020 Medical Rec #:  [id number]      Height:       64.0 in Accession #:    [id number]     Weight:       229.9 lb Date of Birth:  [birth year]      BSA:           2.076 m Patient Age:    71 years       BP:           165/66 mmHg Patient Gender: F              HR:           88 bpm. Exam Location:  [facility] Procedure: Limited Echo, Cardiac Doppler and Color Doppler Indications:    Abnormal ECG R94.31  History:        Patient has prior history of Echocardiogram examinations, most  recent 07/15/2011. COPD; Risk Factors:Hypertension, Diabetes,                 Dyslipidemia and Current Smoker.  Sonographer:    Vickie Epley RDCS Referring Phys: [id number] [facility]  Sonographer Comments: Covid positive. IMPRESSIONS  1. Left ventricular ejection fraction, by estimation, is 65 to 70%. The left ventricle has normal function. The left ventricle has no regional wall motion abnormalities.  2. Right ventricular systolic function is normal. The right ventricular size is normal. Tricuspid regurgitation signal is inadequate for assessing PA pressure.  3. The mitral valve is normal in structure. No evidence of mitral valve regurgitation.  4. The aortic valve was not well visualized. Aortic valve regurgitation is not visualized. No aortic stenosis is present.  5. The inferior vena cava is normal in size with greater than 50% respiratory variability, suggesting right atrial pressure of 3 mmHg. FINDINGS  Left Ventricle: Left ventricular ejection fraction, by estimation, is 65 to 70%. The left ventricle has normal function. The left ventricle has no regional wall motion abnormalities. The left ventricular internal cavity size was small. There is no left ventricular hypertrophy. Right Ventricle: The right ventricular size is normal. No increase in right ventricular wall thickness. Right ventricular systolic function is normal. Tricuspid regurgitation signal is inadequate for assessing PA pressure. Pericardium: There is no evidence of pericardial effusion. Mitral Valve: The mitral valve is normal in structure. Tricuspid Valve: The tricuspid valve is normal in structure.  Tricuspid valve regurgitation is trivial. Aortic Valve: The aortic valve was not well visualized. Aortic valve regurgitation is not visualized. No aortic stenosis is present. Pulmonic Valve: The pulmonic valve was not well visualized. Pulmonic valve regurgitation is not visualized. Aorta: The aortic root is normal in size and structure. Venous: The inferior vena cava is normal in size with greater than 50% respiratory variability, suggesting right atrial pressure of 3 mmHg. IAS/Shunts: No atrial level shunt detected by color flow Doppler. LEFT VENTRICLE PLAX 2D LVIDd:         3.46 cm     Diastology LVIDs:         2.52 cm     LV e' medial:    6.42 cm/s LV PW:         0.88 cm     LV E/e' medial:  18.5 LV IVS:        0.89 cm     LV e' lateral:   6.09 cm/s LVOT diam:     2.00 cm     LV E/e' lateral: 19.5 LV SV:         73 LV SV Index:   35 LVOT Area:     3.14 cm  LV Volumes (MOD) LV vol d, MOD A4C: 64.0 ml LV vol s, MOD A4C: 23.0 ml LV SV MOD A4C:     64.0 ml LEFT ATRIUM         Index LA diam:    3.40 cm 1.64 cm/m  AORTIC VALVE LVOT Vmax:   116.00 cm/s LVOT Vmean:  77.000 cm/s LVOT VTI:    0.232 m  AORTA Ao Root diam: 3.10 cm MITRAL VALVE MV Area (PHT): 3.97 cm     SHUNTS MV Decel Time: 191 msec     Systemic VTI:  0.23 m MV E velocity: 119.00 cm/s  Systemic Diam: 2.00 cm MV A velocity: 108.00 cm/s MV E/A ratio:  1.10 Oswaldo Milian MD Electronically signed by Oswaldo Milian MD Signature Date/Time: 12/07/2020/2:03:35 PM  Final      CBC Recent Labs  Lab 12/02/20 0710 12/03/20 0542 12/07/20 0044 12/08/20 0533  WBC 7.9 14.0* 20.4* 16.7*  HGB 14.4 15.0 13.1 13.3  HCT 44.5 46.2* 40.7 41.0  PLT 244 266 247 232  MCV 92.5 93.1 93.8 93.2  MCH 29.9 30.2 30.2 30.2  MCHC 32.4 32.5 32.2 32.4  RDW 14.3 14.5 14.4 14.4  LYMPHSABS 0.9 1.1 0.6* 0.8  MONOABS 0.2 0.7 1.0 0.7  EOSABS 0.0 0.0 0.2 0.0  BASOSABS 0.1 0.1 0.1 0.1    Chemistries  Recent Labs  Lab 12/02/20 0710 12/03/20 0542  12/07/20 0044 12/08/20 0533  NA 142 144 136 143  K 5.0 4.1 3.8 4.1  CL 103 106 100 103  CO2 26 26 23 29   GLUCOSE 200* 111* 397* 134*  BUN 38* 40* 41* 33*  CREATININE 1.37* 0.91 1.05* 0.67  CALCIUM 9.5 8.9 8.9 10.1  MG 1.6* 1.7  --  1.7  AST 79* 86* 52* 61*  ALT 28 35 42 42  ALKPHOS 71 68 67 67  BILITOT 0.8 0.5 1.7* 0.8   ------------------------------------------------------------------------------------------------------------------ No results for input(s): CHOL, HDL, LDLCALC, TRIG, CHOLHDL, LDLDIRECT in the last 72 hours.  Lab Results  Component Value Date   HGBA1C 10.2 (H) 12/01/2020   ------------------------------------------------------------------------------------------------------------------ Recent Labs    12/08/20 0000  TSH 0.049*   ------------------------------------------------------------------------------------------------------------------ Recent Labs    12/08/20 0533  FERRITIN 227    Coagulation profile No results for input(s): INR, PROTIME in the last 168 hours.  Recent Labs    12/08/20 0533  DDIMER 2.13*    Cardiac Enzymes No results for input(s): CKMB, TROPONINI, MYOGLOBIN in the last 168 hours.  Invalid input(s): CK ------------------------------------------------------------------------------------------------------------------    Component Value Date/Time   BNP 174.0 (H) 12/01/2020 1051   Roxan Hockey M.D on 12/08/2020 at 7:42 PM  Go to [email or web address] - for contact info  [facility] - Office  [phone]

## 2020-12-08 NOTE — TOC Initial Note (Signed)
Transition of Care [facility]) - Initial/Assessment Note    Patient Details  Name: Claudia Keller MRN: [id number] Date of Birth: 02/01/1951  [facility]) CM/SW Contact:    Boneta Lucks, RN Phone Number: 12/08/2020, 5:09 PM  Clinical Narrative:   Patient admitted with pneumonia due to [place], Patient lives alone, patient is high risk for readmission. PT is recommending SNF. Patient is agreeable. She does want to talk to her son tonight. Patient is vaccinated, received hers from [facility] in [place]. FL2 completed and will send out to COVID SNF's.            Expected Discharge Plan: Skilled Nursing Facility Barriers to Discharge: Continued Medical Work up   Patient Goals and CMS Choice Patient states their goals for this hospitalization and ongoing recovery are:: to go to rehab. CMS Medicare.gov Compare Post Acute Care list provided to:: Patient Choice offered to / list presented to : Patient  Expected Discharge Plan and Services Expected Discharge Plan: [place]      Living arrangements for the past 2 months: Apartment                   Prior Living Arrangements/Services Living arrangements for the past 2 months: Apartment Lives with:: Self Patient language and need for interpreter reviewed:: Yes Do you feel safe going back to the place where you live?: Yes      Need for Family Participation in Patient Care: Yes (Comment) Care giver support system in place?: Yes (comment) Current home services: DME Criminal Activity/Legal Involvement Pertinent to Current Situation/Hospitalization: No - Comment as needed  Activities of Daily Living Home Assistive Devices/Equipment: Dentures (specify type),Eyeglasses,CBG Meter,Walker (specify type),Raised toilet seat with rails ADL Screening (condition at time of admission) Patient's cognitive ability adequate to safely complete daily activities?: Yes Is the patient deaf or have difficulty hearing?: No Does the  patient have difficulty seeing, even when wearing glasses/contacts?: No Does the patient have difficulty concentrating, remembering, or making decisions?: Yes Patient able to express need for assistance with ADLs?: Yes Does the patient have difficulty dressing or bathing?: No Independently performs ADLs?: Yes (appropriate for developmental age) Does the patient have difficulty walking or climbing stairs?: Yes Weakness of Legs: Both Weakness of Arms/Hands: None  Permission Sought/Granted   Emotional Assessment    Orientation: : Oriented to Self,Oriented to Place,Oriented to  Time,Oriented to Situation Alcohol / Substance Use: Not Applicable Psych Involvement: No (comment)  Admission diagnosis:  Hyperglycemia [R73.9] Hypoxia [R09.02] Fracture of multiple toes, left, closed, initial encounter [S92.912A] Pneumonia due to COVID-19 virus [U07.1, J12.82] Acute respiratory disease due to COVID-19 virus [U07.1, J06.9] Patient Active Problem List   Diagnosis Date Noted  . Pneumonia due to COVID-19 virus 12/07/2020  . Leukocytosis 12/07/2020  . Hyperglycemia due to diabetes mellitus ([place]) 12/07/2020  . Hypoalbuminemia 12/07/2020  . Elevated AST (SGOT) 12/07/2020  . Obesity (BMI 30-39.9) 12/07/2020  . COPD (chronic obstructive pulmonary disease) ([place]) 12/07/2020  . GERD (gastroesophageal reflux disease) 12/07/2020  . Diabetic neuropathy ([place]) 12/07/2020  . Depression 12/07/2020  . Anxiety 12/07/2020  . Acute respiratory disease due to COVID-19 virus 12/07/2020  . Acute respiratory failure with hypoxia ([place]) 12/01/2020  . Segmental colitis without complication ([place])   . Class 2 obesity due to excess calories with body mass index (BMI) of 37.0 to 37.9 in adult   . Ischemic colitis ([place]) 03/19/2020  . Noncompliance with medication regimen 03/18/2020  . Acute renal failure superimposed on stage 3a  chronic kidney disease ([place]) 03/18/2020  . DKA (diabetic ketoacidoses) 03/17/2020  . Bipolar  II disorder ([employer]) 04/17/2018  . PTSD (post-traumatic stress disorder) 04/17/2018  . Personal history of noncompliance with medical treatment, presenting hazards to health 08/17/2016  . Emesis 05/20/2016  . Uncontrolled diabetes mellitus with stage 2 chronic kidney disease ([place]) 09/08/2015  . Essential hypertension, benign 09/08/2015  . Primary hypothyroidism 09/08/2015  . Diverticulosis of colon without hemorrhage   . Nausea vomiting and diarrhea 10/27/2011  . Chest pain 06/24/2011  . Hypercholesterolemia 06/24/2011  . Tobacco abuse 06/24/2011  . IRRITABLE BOWEL SYNDROME 11/12/2009  . Diarrhea 11/12/2009  . OTHER SYMPTOMS INVOLVING DIGESTIVE SYSTEM OTHER 11/12/2009  . History of colonic polyps 11/12/2009  . Closed fracture of metatarsal bone 11/30/2007  . HIGH BLOOD PRESSURE 11/30/2007   PCP:  Rosita Fire, MD Pharmacy:   [facility], [place] [address] Phone: [phone] Fax: [facility], [place] [address] Phone: [phone] Fax: [phone]  Readmission Risk Interventions Readmission Risk Prevention Plan 12/08/2020  Transportation Screening Complete  HRI or Home Care Consult Complete  Social Work Consult for [place] Planning/Counseling Complete  Palliative Care Screening Not Applicable  Medication Review Press photographer) Complete  Some recent data might be hidden

## 2020-12-08 NOTE — Progress Notes (Signed)
Walked into room to give patient's breathing treatment, patient was on RA with [place] laying beside her head but cut off at wall.  Pulse ox read 83% on RA, placed patient back on 2L and patient came back up to 90% after treatment.  Made RN aware.

## 2020-12-09 ENCOUNTER — Telehealth: Payer: Self-pay | Admitting: Internal Medicine

## 2020-12-09 LAB — CBC WITH DIFFERENTIAL/PLATELET
Abs Immature Granulocytes: 0.54 10*3/uL — ABNORMAL HIGH (ref 0.00–0.07)
Basophils Absolute: 0.1 10*3/uL (ref 0.0–0.1)
Basophils Relative: 1 %
Eosinophils Absolute: 0 10*3/uL (ref 0.0–0.5)
Eosinophils Relative: 0 %
HCT: 40.9 % (ref 36.0–46.0)
Hemoglobin: 13.2 g/dL (ref 12.0–15.0)
Immature Granulocytes: 4 %
Lymphocytes Relative: 4 %
Lymphs Abs: 0.5 10*3/uL — ABNORMAL LOW (ref 0.7–4.0)
MCH: 30.5 pg (ref 26.0–34.0)
MCHC: 32.3 g/dL (ref 30.0–36.0)
MCV: 94.5 fL (ref 80.0–100.0)
Monocytes Absolute: 0.5 10*3/uL (ref 0.1–1.0)
Monocytes Relative: 4 %
Neutro Abs: 10.9 10*3/uL — ABNORMAL HIGH (ref 1.7–7.7)
Neutrophils Relative %: 87 %
Platelets: 214 10*3/uL (ref 150–400)
RBC: 4.33 MIL/uL (ref 3.87–5.11)
RDW: 14.3 % (ref 11.5–15.5)
WBC: 12.5 10*3/uL — ABNORMAL HIGH (ref 4.0–10.5)
nRBC: 0 % (ref 0.0–0.2)

## 2020-12-09 LAB — COMPREHENSIVE METABOLIC PANEL
ALT: 37 U/L (ref 0–44)
AST: 45 U/L — ABNORMAL HIGH (ref 15–41)
Albumin: 2.5 g/dL — ABNORMAL LOW (ref 3.5–5.0)
Alkaline Phosphatase: 77 U/L (ref 38–126)
Anion gap: 11 (ref 5–15)
BUN: 34 mg/dL — ABNORMAL HIGH (ref 8–23)
CO2: 29 mmol/L (ref 22–32)
Calcium: 9.8 mg/dL (ref 8.9–10.3)
Chloride: 100 mmol/L (ref 98–111)
Creatinine, Ser: 0.81 mg/dL (ref 0.44–1.00)
GFR, Estimated: >60 mL/min (ref >60)
Glucose, Bld: 291 mg/dL — ABNORMAL HIGH (ref 70–99)
Potassium: 4.1 mmol/L (ref 3.5–5.1)
Sodium: 140 mmol/L (ref 135–145)
Total Bilirubin: 0.7 mg/dL (ref 0.3–1.2)
Total Protein: 6.2 g/dL — ABNORMAL LOW (ref 6.5–8.1)

## 2020-12-09 LAB — GLUCOSE, CAPILLARY
Glucose-Capillary: 339 mg/dL — ABNORMAL HIGH (ref 70–99)
Glucose-Capillary: 183 mg/dL — ABNORMAL HIGH (ref 70–99)
Glucose-Capillary: 201 mg/dL — ABNORMAL HIGH (ref 70–99)
Glucose-Capillary: 231 mg/dL — ABNORMAL HIGH (ref 70–99)

## 2020-12-09 LAB — PHOSPHORUS: Phosphorus: 2.9 mg/dL (ref 2.5–4.6)

## 2020-12-09 LAB — MAGNESIUM: Magnesium: 1.7 mg/dL (ref 1.7–2.4)

## 2020-12-09 LAB — C-REACTIVE PROTEIN: CRP: 1.4 mg/dL — ABNORMAL HIGH (ref <1.0)

## 2020-12-09 LAB — D-DIMER, QUANTITATIVE: D-Dimer, Quant: 2.08 ug/mL-FEU — ABNORMAL HIGH (ref 0.00–0.50)

## 2020-12-09 LAB — FERRITIN: Ferritin: 293 ng/mL (ref 11–307)

## 2020-12-09 MED ORDER — ENOXAPARIN SODIUM 40 MG/0.4ML ~~LOC~~ SOLN
40.0000 mg | SUBCUTANEOUS | Status: DC
  Administered 2020-12-10 – 2020-12-12 (×3): 40 mg via SUBCUTANEOUS
  Filled 2020-12-09 (×3): qty 0.4

## 2020-12-09 MED ORDER — MAGNESIUM SULFATE 2 GM/50ML IV SOLN
2.0000 g | Freq: Once | INTRAVENOUS | Status: AC
  Administered 2020-12-09: 2 g via INTRAVENOUS
  Filled 2020-12-09: qty 50

## 2020-12-09 NOTE — Evaluation (Signed)
Occupational Therapy Evaluation Patient Details Name: Claudia Keller MRN: [id number] DOB: 17-Mar-1951 Today's Date: 12/09/2020    History of Present Illness Claudia Keller is a 70 y.o. female with medical history significant for COPD, morbid obesity, hypertension, hypothyroidism, hyperlipidemia, type 2 diabetes mellitus with nephropathy and neuropathy, tobacco abuse and depression/anxiety who presents to the emergency department due to left foot pain and elevated blood glucose level.  Patient was recently admitted and discharged  from 1/24-1/26 due to acute respiratory failure with hypoxia secondary to COVID-19 pneumonia and uncontrolled type 2 diabetes mellitus with A1c of 10.2%.  She was treated with remdesivir for 3 days and she did not require home oxygen on discharge.  Patient states that she slipped at home and hurt her foot when she presented to the emergency department on December 01, 2020 and states that the foot was not addressed during the admission, she complained of left foot pain at home.  She also states that she has had difficulty in being able to control her blood glucose level at home.  When asked if she has been compliant with her medications, she states that she tries her best.  She lives alone and she activated EMS, who reported that pulse ox was 82% on room air and a CBG prior to arrival was 526.   Clinical Impression   Pt agreeable to OT evaluation this am. Pt sitting up at EOB at supervision level, attempting to stand x3 attempts with success on 3rd attempt. Pt performing morning ADLs seated at EOB, unable to stand at sink due to pain in left foot. Once standing pt able to side step beside bed before needing to sit due to pain. Pt primarily limited due to L foot pain and limited ability to stand and ambulate. Recommend SNF on discharge to improve safety and independence in ADLs and functional mobility. No further acute OT needs at this time.     Follow Up Recommendations  SNF     Equipment Recommendations  None recommended by OT       Precautions / Restrictions Precautions Precautions: Fall Restrictions Weight Bearing Restrictions: No      Mobility Bed Mobility Overal bed mobility: Needs Assistance Bed Mobility: Supine to Sit;Sit to Supine     Supine to sit: Supervision Sit to supine: Supervision        Transfers Overall transfer level: Needs assistance Equipment used: Rolling walker (2 wheeled) Transfers: Sit to/from Stand Sit to Stand: Min assist         General transfer comment: Pt requiring 3 trials to stand at EOB, cuing for hand placement on rails and for pushing into BUE to stand        ADL either performed or assessed with clinical judgement   ADL Overall ADL's : Needs assistance/impaired     Grooming: Set up;Sitting Grooming Details (indicate cue type and reason): pt washing face and hands while seated at EOB, unable to complete in standing due to L foot pain             Lower Body Dressing: Supervision/safety;Sitting/lateral leans Lower Body Dressing Details (indicate cue type and reason): pt adjusting socks while seated at EOB                     Vision Baseline Vision/History: No visual deficits Patient Visual Report: No change from baseline Vision Assessment?: No apparent visual deficits            Pertinent Vitals/Pain Pain Assessment: 0-10  Pain Score: 2  Pain Location: left fore foot Pain Descriptors / Indicators: Sore;Discomfort;Grimacing Pain Intervention(s): Limited activity within patient's tolerance;Monitored during session;Repositioned     Hand Dominance Right   Extremity/Trunk Assessment Upper Extremity Assessment Upper Extremity Assessment: Generalized weakness   Lower Extremity Assessment Lower Extremity Assessment: Defer to PT evaluation   Cervical / Trunk Assessment Cervical / Trunk Assessment: Normal   Communication Communication Communication: No difficulties   Cognition  Arousal/Alertness: Awake/alert Behavior During Therapy: WFL for tasks assessed/performed Overall Cognitive Status: Within Functional Limits for tasks assessed                                                Home Living Family/patient expects to be discharged to:: Private residence Living Arrangements: Alone Available Help at Discharge: Family;Available PRN/intermittently Type of Home: Apartment Home Access: Elevator     Home Layout: One level     Bathroom Shower/Tub: Teacher, early years/pre: Handicapped height     Home Equipment: Cane - single point;Walker - 4 wheels;Bedside commode;Shower seat;Grab bars - tub/shower;Walker - 2 wheels          Prior Functioning/Environment Level of Independence: Needs assistance  Gait / Transfers Assistance Needed: household ambulator using Rollator ADL's / Homemaking Assistance Needed: Pt reports she had an aide but they had to stop coming because of bedbugs. Since that time has been completing what ADLs she can independently            OT Problem List: Decreased strength;Decreased activity tolerance;Impaired balance (sitting and/or standing);Decreased safety awareness;Decreased knowledge of use of DME or AE;Impaired UE functional use;Pain          End of Session Equipment Utilized During Treatment: Gait belt;Rolling walker;Oxygen  Activity Tolerance: Patient tolerated treatment well Patient left: in bed;with call bell/phone within reach;with bed alarm set  OT Visit Diagnosis: Muscle weakness (generalized) (M62.81);Pain Pain - Right/Left: Left Pain - part of body: Ankle and joints of foot                Time: 3903-0092 OT Time Calculation (min): 23 min Charges:  OT General Charges $OT Visit: 1 Visit OT Evaluation $OT Eval Low Complexity: [place], OTR/L  [phone] 12/09/2020, 8:38 AM

## 2020-12-09 NOTE — Telephone Encounter (Signed)
   Telephone encounter was:  Unsuccessful.  12/09/2020 Name: Claudia Keller MRN: [id number] DOB: February 01, 1951  Unsuccessful outbound call made today to assist with:  bed bug removal.   Outreach Attempt:  1st Attempt  A HIPAA compliant voice message was left requesting a return call.  Instructed patient to call back at [phone]. Patient currently in hospital.  [place], Care Management Phone: [phone] Email: [email or web address]

## 2020-12-09 NOTE — Progress Notes (Signed)
Pt requested for mr to give her son that was waiting downstairs her house keys. Keys taken and given to son Charlotte Crumb that was waiting downstairs.

## 2020-12-09 NOTE — Progress Notes (Signed)
Patient Demographics:    Claudia Keller, is a 70 y.o. female, DOB - 1951/06/24, SWF:[id number]  Admit date - 12/06/2020   Admitting Physician Ellysa Parrack Denton Brick, MD  Outpatient Primary MD for the patient is Rosita Fire, MD  LOS - 2   Chief Complaint  Patient presents with  . Hyperglycemia  . Shortness of Breath        Subjective:    Claudia Keller today has no fevers, no emesis,  No chest pain,  -Cough and hypoxia improving down to 2 L of oxygen by nasal cannula -Remains pretty weak overall  Assessment  & Plan :    Principal Problem:   Pneumonia due to COVID-19 virus Active Problems:   Primary hypothyroidism   Closed fracture of metatarsal bone   Hypercholesterolemia   Tobacco abuse   Essential hypertension, benign   Noncompliance with medication regimen   Acute respiratory failure with hypoxia (HCC)   Leukocytosis   Hyperglycemia due to diabetes mellitus (HCC)   Hypoalbuminemia   Elevated AST (SGOT)   Obesity (BMI 30-39.9)   COPD (chronic obstructive pulmonary disease) (HCC)   GERD (gastroesophageal reflux disease)   Diabetic neuropathy (HCC)   Depression   Anxiety   Acute respiratory disease due to COVID-19 virus   Brief Summary:- 70 y.o.femalewith medical history significant forCOPD, morbid obesity, hypertension, hypothyroidism, hyperlipidemia, type 2 diabetes mellitus with nephropathy and neuropathy, tobacco abuse and depression/anxiety readmitted on 12/07/2020 after being recently discharged on 12/03/2020 post treatment for Covid pneumonia -Medically improved, now awaiting transfer to SNF rehab    A/p  1)Acute hypoxic respiratory failure secondary to COVID-19 infection/Pneumonia--- The treatment plan and use of medications for treatment of COVID-19 infectionand possible side effects were discussed with patient --Currently requiring 2 L of oxygen via nasal  cannula -----Patient verbalizes understanding and agrees to treatment protocols  --Patient is positive for COVID-19 infection, chest x-ray with findings of infiltrates/opacities,  patient is tachypneic/hypoxic and requiring continuous supplemental oxygen---patient meets criteria for initiation of Remdesivir AND Steroid therapy per protocol  --Check and trend inflammatory markers including D-dimer, ferritin and  CRP---also follow CBC and CMP --Supplemental oxygen to keep O2 sats above 93% -Follow serial chest x-rays and ABGs as indicated --- Encourage prone positioning for More than 16 hours/day in increments of 2 to 3 hours at a time if able to tolerate --Attempt to maintain euvolemic state --Zinc and vitamin C as ordered -Albuterol inhaler as needed -Accu-Cheks/fingersticks while on high-dose steroids -PPI while on high-dose steroids -Treated with IV Solu-Medrol, baricitinib, bronchodilators -Patient recently treated with IV remdesivir -Okay to transition to p.o. prednisone COVID-19 Labs  Recent Labs    12/08/20 0533 12/09/20 0422  DDIMER 2.13* 2.08*  FERRITIN 227 293  CRP 5.0* 1.4*    Lab Results  Component Value Date   SARSCOV2NAA POSITIVE (A) 12/01/2020   [place] NEGATIVE 03/17/2020   [place] Not Detected 09/07/2019     2)New onset A. fib with RVR in setting of acute hypoxic respiratory failure secondary to Covid infection/pneumonia---- echo with preserved EF of 65 to 70%, -TSH is 0.049 reflecting over replacement of thyroid-- hold levothyroxine -No prior history of A. fib, will need anticoagulation if A. fib persist -Rate control is improved on IV Cardizem initially, patient is  now on oral Cardizem  3)DM2-A1c is 10.2 reflecting uncontrolled DM with hyperglycemia PTA -Anticipate further worsening hyperglycemia with steroids C/n  Levemir to 20 units twice daily -Continue linagliptin and sliding scale insulin coverage  4) hypothyroidism--TSH is 0.049  reflecting over replacement of thyroid--with hold levothyroxine  5) bipolar disorder--- stable, continues Seroquel, Paxil and Remeron  6) tobacco abuse and COPD--smokes 2 packs daily, c/n  nicotine patch -Steroids and bronchodilators as above #1  7)HTN--- continue lisinopril 20 mg daily stop amlodipine in order to allow for  Cardizem use for A. fib as above #2  8) social/ethics--- lives alone,   she is a full code  9) second and third phalanges fracture of the left foot--- conservative management  10) leukocytosis--- suspect secondary to steroid therapy  11) generalized weakness and deconditioning--- PT eval appreciated recommends SNF rehab  12) COPD and tobacco abuse--patient smokes 2 packs a day, try nicotine patch -Steroids as above  Disposition/Need for in-Hospital Stay- patient unable to be discharged at this time due to hypoxic respiratory failure secondary to Covid pneumonia and A. fib with RVR --Medically improved, now awaiting transfer to SNF rehab  Status is: Inpatient  Remains inpatient appropriate because:Please see above   Disposition: The patient is from: Home              Anticipated d/c is to: SNF              Anticipated d/c date is: 1 day              Patient currently is medically stable to d/c. -Medically improved, now awaiting transfer to SNF rehab  Code Status :  -  Code Status: Full Code   Family Communication:    NA (patient is alert, awake and coherent)   Consults  :  na  DVT Prophylaxis  :   - SCDs *  enoxaparin (LOVENOX) injection 40 mg Start: 12/10/20 1000 SCDs Start: 12/07/20 0207    Lab Results  Component Value Date   PLT 214 12/09/2020    Inpatient Medications  Scheduled Meds: . albuterol  2 puff Inhalation TID  . vitamin C  500 mg Oral Daily  . atorvastatin  20 mg Oral QHS  . baricitinib  4 mg Oral Daily  . diltiazem  30 mg Oral Q6H  . [START ON 12/10/2020] enoxaparin (LOVENOX) injection  40 mg Subcutaneous Q24H  . feeding  supplement (GLUCERNA SHAKE)  237 mL Oral TID BM  . gabapentin  100 mg Oral TID  . insulin aspart  0-15 Units Subcutaneous TID WC  . insulin aspart  0-5 Units Subcutaneous QHS  . insulin aspart  6 Units Subcutaneous TID WC  . insulin detemir  20 Units Subcutaneous BID  . linagliptin  5 mg Oral Daily  . lisinopril  20 mg Oral Daily  . mirtazapine  7.5 mg Oral QHS  . nicotine  21 mg Transdermal Daily  . pantoprazole (PROTONIX) IV  40 mg Intravenous QHS  . PARoxetine  40 mg Oral Daily  . [START ON 12/10/2020] predniSONE  50 mg Oral Daily  . QUEtiapine  100 mg Oral QHS  . zinc sulfate  220 mg Oral Daily   Continuous Infusions: . magnesium sulfate bolus IVPB     PRN Meds:.acetaminophen, chlorpheniramine-HYDROcodone, guaiFENesin-dextromethorphan    Anti-infectives (From admission, onward)   None        Objective:   Vitals:   12/09/20 0540 12/09/20 0925 12/09/20 1454 12/09/20 1624  BP: (!) 172/57  Marland Kitchen)  151/53   Pulse: (!) 40  75   Resp: 20  18   Temp: 97.8 F (36.6 C)     TempSrc: Oral     SpO2: 93% 93% 92% 92%  Weight:      Height:        Wt Readings from Last 3 Encounters:  12/08/20 73.3 kg  12/01/20 104.3 kg  03/19/20 104.1 kg     Intake/Output Summary (Last 24 hours) at 12/09/2020 1826 Last data filed at 12/09/2020 1431 Gross per 24 hour  Intake 480 ml  Output 1400 ml  Net -920 ml   Physical Exam  Gen:- Awake Alert,  In no apparent distress  HEENT:- [place].AT, No sclera icterus Nose- [place] 2L/min Neck-Supple Neck,No JVD,.  Lungs-improving air movement, no wheeze  CV- S1, S2 normal, irregular  Abd-  +ve B.Sounds, Abd Soft, No tenderness,    Extremity/Skin:- No  edema, pedal pulses present  Psych-affect is appropriate, oriented x3 Neuro-Generalized weakness, no new focal deficits, no tremors   Data Review:   Micro Results Recent Results (from the past 240 hour(s))  SARS CORONAVIRUS 2 (TAT 6-24 HRS) Nasopharyngeal Nasopharyngeal Swab     Status: Abnormal    Collection Time: 12/01/20 10:35 AM   Specimen: Nasopharyngeal Swab  Result Value Ref Range Status   SARS Coronavirus 2 POSITIVE (A) NEGATIVE Final    Comment: (NOTE) SARS-CoV-2 target nucleic acids are DETECTED.  The SARS-CoV-2 RNA is generally detectable in upper and lower respiratory specimens during the acute phase of infection. Positive results are indicative of the presence of SARS-CoV-2 RNA. Clinical correlation with patient history and other diagnostic information is  necessary to determine patient infection status. Positive results do not rule out bacterial infection or co-infection with other viruses.  The expected result is Negative.  Fact Sheet for Patients: SugarRoll.be  Fact Sheet for Healthcare Providers: https://www.woods-mathews.com/  This test is not yet approved or cleared by the [place] FDA and  has been authorized for detection and/or diagnosis of SARS-CoV-2 by FDA under an Emergency Use Authorization (EUA). This EUA will remain  in effect (meaning this test can be used) for the duration of the COVID-19 declaration under Section 564(b)(1) of the Act, 21 U. S.C. section 360bbb-3(b)(1), unless the authorization is terminated or revoked sooner.   Performed at [facility], [place] [address], [place], [place] [address]    Radiology Reports CT Chest Wo Contrast  Result Date: 12/01/2020 CLINICAL DATA:  Decreased oxygen saturation EXAM: CT CHEST WITHOUT CONTRAST TECHNIQUE: Multidetector CT imaging of the chest was performed following the standard protocol without IV contrast. COMPARISON:  Chest radiograph December 01, 2020 FINDINGS: Cardiovascular: There is no thoracic aortic aneurysm. There are foci of great vessel calcification. There are foci aortic atherosclerosis as well as multiple foci of coronary artery calcification. A small amount of pleural fluid is within physiologic range. No pericardial thickening evident.  Mediastinum/Nodes: Thyroid is diminutive. No thyroid lesions are evident. There is an aortopulmonary window lymph node measuring 1.2 x 1.0 cm. There is a subcarinal lymph node measuring 1.3 x 1.2 cm. There are scattered subcentimeter mediastinal and axillary lymph nodes elsewhere. There is no appreciable esophageal lesion. Lungs/Pleura: There is scarring in the apices. There are scattered areas of ill-defined airspace opacity throughout the lungs bilaterally without consolidation. No pleural effusion. No pneumothorax. Trachea and major bronchial structures appear patent. Upper Abdomen: There is upper abdominal aortic atherosclerosis. Gallbladder is absent. Visualized upper abdominal structures otherwise appear unremarkable. Musculoskeletal: No  blastic or lytic bone lesions. No evident chest wall lesions. IMPRESSION: 1. Multifocal airspace opacity throughout the lungs without consolidation. This appearance is felt to be indicative of atypical organism pneumonia. Correlation with COVID-19 status advised. 2. Mildly prominent aortopulmonary window and subcarinal lymph nodes likely are of reactive etiology given the underlying parenchymal lung changes. 3. Aortic atherosclerosis. Foci of great vessel and coronary artery calcification noted. 4.  Absent gallbladder. Aortic Atherosclerosis (ICD10-I70.0). Electronically Signed   By: Lowella Grip III M.D.   On: 12/01/2020 14:15   NM Pulmonary Perfusion  Result Date: 12/01/2020 CLINICAL DATA:  Shortness of breath for months. Pulmonary embolus suspected with high probability. EXAM: NUCLEAR MEDICINE PERFUSION LUNG SCAN TECHNIQUE: Perfusion images were obtained in multiple projections after intravenous injection of radiopharmaceutical. Ventilation scans intentionally deferred if perfusion scan and chest x-ray adequate for interpretation during COVID 19 epidemic. RADIOPHARMACEUTICALS:  4.3 mCi Tc-18m MAA IV COMPARISON:  CT chest 12/01/2020.  Chest radiograph 12/01/2020.  FINDINGS: Perfusion studies obtained without corresponding ventilation study. Perfusion study demonstrates normal homogeneous uptake of activity throughout both lungs. No focal perfusion defects are identified. Appearance suggest very low probability of pulmonary embolus. IMPRESSION: Normal homogeneous perfusion of both lungs. Electronically Signed   By: Lucienne Capers M.D.   On: 12/01/2020 22:04   DG Chest Port 1 View  Result Date: 12/07/2020 CLINICAL DATA:  Hypoxia, positive COVID-19 test EXAM: PORTABLE CHEST 1 VIEW COMPARISON:  12/01/2020 FINDINGS: Cardiac shadow is stable. Aortic calcifications are seen. Increase in patchy airspace opacities bilaterally consistent with the given clinical history of COVID-19 positivity. No sizable effusion is seen. No bony abnormality is noted. IMPRESSION: Increasing airspace opacity bilaterally consistent with the given clinical history of COVID-19 positivity. Electronically Signed   By: Inez Catalina M.D.   On: 12/07/2020 00:33   DG Chest Port 1 View  Result Date: 12/01/2020 CLINICAL DATA:  Shortness of breath and hypoxia EXAM: PORTABLE CHEST 1 VIEW COMPARISON:  11/30/2017 FINDINGS: Borderline cardiomegaly likely accentuated by mediastinal fat. Interstitial coarsening and hazy density at the bases. No effusion, consolidation, or pneumothorax. Artifact from EKG leads. IMPRESSION: Hazy density in the lower lungs which could be atypical infection or vascular congestion. Electronically Signed   By: Monte Fantasia M.D.   On: 12/01/2020 10:30   DG Foot Complete Left  Result Date: 12/07/2020 CLINICAL DATA:  Fall 1 week ago with persistent foot pain, initial encounter EXAM: LEFT FOOT - COMPLETE 3+ VIEW COMPARISON:  None. FINDINGS: Undisplaced fractures of the second and third proximal phalanges are noted. No other fracture is seen. No soft tissue abnormality is noted. IMPRESSION: Fractures of the second and third proximal phalanges. Electronically Signed   By: Inez Catalina M.D.   On: 12/07/2020 00:34   ECHOCARDIOGRAM LIMITED  Result Date: 12/07/2020    ECHOCARDIOGRAM LIMITED REPORT   Patient Name:   JENNEY VANDENBERGE Date of Exam: 12/07/2020 Medical Rec #:  [id number]      Height:       64.0 in Accession #:    [id number]     Weight:       229.9 lb Date of Birth:  12-Jul-1951      BSA:          2.076 m Patient Age:    47 years       BP:           165/66 mmHg Patient Gender: F              HR:  88 bpm. Exam Location:  [facility] Procedure: Limited Echo, Cardiac Doppler and Color Doppler Indications:    Abnormal ECG R94.31  History:        Patient has prior history of Echocardiogram examinations, most                 recent 07/15/2011. COPD; Risk Factors:Hypertension, Diabetes,                 Dyslipidemia and Current Smoker.  Sonographer:    Vickie Epley RDCS Referring Phys: [id number] [facility]  Sonographer Comments: Covid positive. IMPRESSIONS  1. Left ventricular ejection fraction, by estimation, is 65 to 70%. The left ventricle has normal function. The left ventricle has no regional wall motion abnormalities.  2. Right ventricular systolic function is normal. The right ventricular size is normal. Tricuspid regurgitation signal is inadequate for assessing PA pressure.  3. The mitral valve is normal in structure. No evidence of mitral valve regurgitation.  4. The aortic valve was not well visualized. Aortic valve regurgitation is not visualized. No aortic stenosis is present.  5. The inferior vena cava is normal in size with greater than 50% respiratory variability, suggesting right atrial pressure of 3 mmHg. FINDINGS  Left Ventricle: Left ventricular ejection fraction, by estimation, is 65 to 70%. The left ventricle has normal function. The left ventricle has no regional wall motion abnormalities. The left ventricular internal cavity size was small. There is no left ventricular hypertrophy. Right Ventricle: The right ventricular size is normal. No increase in right  ventricular wall thickness. Right ventricular systolic function is normal. Tricuspid regurgitation signal is inadequate for assessing PA pressure. Pericardium: There is no evidence of pericardial effusion. Mitral Valve: The mitral valve is normal in structure. Tricuspid Valve: The tricuspid valve is normal in structure. Tricuspid valve regurgitation is trivial. Aortic Valve: The aortic valve was not well visualized. Aortic valve regurgitation is not visualized. No aortic stenosis is present. Pulmonic Valve: The pulmonic valve was not well visualized. Pulmonic valve regurgitation is not visualized. Aorta: The aortic root is normal in size and structure. Venous: The inferior vena cava is normal in size with greater than 50% respiratory variability, suggesting right atrial pressure of 3 mmHg. IAS/Shunts: No atrial level shunt detected by color flow Doppler. LEFT VENTRICLE PLAX 2D LVIDd:         3.46 cm     Diastology LVIDs:         2.52 cm     LV e' medial:    6.42 cm/s LV PW:         0.88 cm     LV E/e' medial:  18.5 LV IVS:        0.89 cm     LV e' lateral:   6.09 cm/s LVOT diam:     2.00 cm     LV E/e' lateral: 19.5 LV SV:         73 LV SV Index:   35 LVOT Area:     3.14 cm  LV Volumes (MOD) LV vol d, MOD A4C: 64.0 ml LV vol s, MOD A4C: 23.0 ml LV SV MOD A4C:     64.0 ml LEFT ATRIUM         Index LA diam:    3.40 cm 1.64 cm/m  AORTIC VALVE LVOT Vmax:   116.00 cm/s LVOT Vmean:  77.000 cm/s LVOT VTI:    0.232 m  AORTA Ao Root diam: 3.10 cm MITRAL VALVE MV Area (PHT): 3.97 cm  SHUNTS MV Decel Time: 191 msec     Systemic VTI:  0.23 m MV E velocity: 119.00 cm/s  Systemic Diam: 2.00 cm MV A velocity: 108.00 cm/s MV E/A ratio:  1.10 Oswaldo Milian MD Electronically signed by Oswaldo Milian MD Signature Date/Time: 12/07/2020/2:03:35 PM    Final     CBC Recent Labs  Lab 12/03/20 0542 12/07/20 0044 12/08/20 0533 12/09/20 0422  WBC 14.0* 20.4* 16.7* 12.5*  HGB 15.0 13.1 13.3 13.2  HCT 46.2* 40.7  41.0 40.9  PLT 266 247 232 214  MCV 93.1 93.8 93.2 94.5  MCH 30.2 30.2 30.2 30.5  MCHC 32.5 32.2 32.4 32.3  RDW 14.5 14.4 14.4 14.3  LYMPHSABS 1.1 0.6* 0.8 0.5*  MONOABS 0.7 1.0 0.7 0.5  EOSABS 0.0 0.2 0.0 0.0  BASOSABS 0.1 0.1 0.1 0.1    Chemistries  Recent Labs  Lab 12/03/20 0542 12/07/20 0044 12/08/20 0533 12/09/20 0422  NA 144 136 143 140  K 4.1 3.8 4.1 4.1  CL 106 100 103 100  CO2 26 23 29 29   GLUCOSE 111* 397* 134* 291*  BUN 40* 41* 33* 34*  CREATININE 0.91 1.05* 0.67 0.81  CALCIUM 8.9 8.9 10.1 9.8  MG 1.7  --  1.7 1.7  AST 86* 52* 61* 45*  ALT 35 42 42 37  ALKPHOS 68 67 67 77  BILITOT 0.5 1.7* 0.8 0.7   ------------------------------------------------------------------------------------------------------------------ No results for input(s): CHOL, HDL, LDLCALC, TRIG, CHOLHDL, LDLDIRECT in the last 72 hours.  Lab Results  Component Value Date   HGBA1C 10.2 (H) 12/01/2020   ------------------------------------------------------------------------------------------------------------------ Recent Labs    12/08/20 0000  TSH 0.049*   ------------------------------------------------------------------------------------------------------------------ Recent Labs    12/08/20 0533 12/09/20 0422  FERRITIN 227 293    Coagulation profile No results for input(s): INR, PROTIME in the last 168 hours.  Recent Labs    12/08/20 0533 12/09/20 0422  DDIMER 2.13* 2.08*    Cardiac Enzymes No results for input(s): CKMB, TROPONINI, MYOGLOBIN in the last 168 hours.  Invalid input(s): CK ------------------------------------------------------------------------------------------------------------------    Component Value Date/Time   BNP 174.0 (H) 12/01/2020 1051   Roxan Hockey M.D on 12/09/2020 at 6:26 PM  Go to [email or web address] - for contact info  [facility] - Office  [phone]

## 2020-12-09 NOTE — Progress Notes (Signed)
Physical Therapy Treatment Patient Details Name: STEPHNIE PARLIER MRN: [id number] DOB: May 06, 1951 Today's Date: 12/09/2020    History of Present Illness Claudia Keller is a 70 y.o. female with medical history significant for COPD, morbid obesity, hypertension, hypothyroidism, hyperlipidemia, type 2 diabetes mellitus with nephropathy and neuropathy, tobacco abuse and depression/anxiety who presents to the emergency department due to left foot pain and elevated blood glucose level.  Patient was recently admitted and discharged  from 1/24-1/26 due to acute respiratory failure with hypoxia secondary to COVID-19 pneumonia and uncontrolled type 2 diabetes mellitus with A1c of 10.2%.  She was treated with remdesivir for 3 days and she did not require home oxygen on discharge.  Patient states that she slipped at home and hurt her foot when she presented to the emergency department on December 01, 2020 and states that the foot was not addressed during the admission, she complained of left foot pain at home.  She also states that she has had difficulty in being able to control her blood glucose level at home.  When asked if she has been compliant with her medications, she states that she tries her best.  She lives alone and she activated EMS, who reported that pulse ox was 82% on room air and a CBG prior to arrival was 526.    PT Comments    Pt lying in bed, eager to participate with therapy today.  Pt donned cast shoe on Lt foot independently and completed all bed mobility independently but with increased time and labored movements.  Pt able to stand on first attempt today but with need of UE's to assist.    Pt ambulated 12' before 02 sats dropped around 86%.  Pt recovered between each bout with max return of 92%.  Pt remained on 2 LPM via nasal canula.   Pt returned supine per request following activity.  Noted fatigue.   Follow Up Recommendations  SNF     Equipment Recommendations  None recommended by PT     Recommendations for Other Services       Precautions / Restrictions Precautions Precautions: Fall    Mobility  Bed Mobility Overal bed mobility: Modified Independent Bed Mobility: Supine to Sit;Sit to Supine     Supine to sit: Modified independent (Device/Increase time) Sit to supine: Modified independent (Device/Increase time)   General bed mobility comments: increased time, labored movement  Transfers Overall transfer level: Needs assistance Equipment used: Rolling walker (2 wheeled)   Sit to Stand: Min guard         General transfer comment: Increased ease to stand today, definate need for UEs to assist  Ambulation/Gait Ambulation/Gait assistance: Min assist Gait Distance (Feet): 24 Feet Assistive device: Rolling walker (2 wheeled) Gait Pattern/deviations: Decreased step length - right;Decreased step length - left;Decreased stride length Gait velocity: decreased   General Gait Details: Pt ambulated 12' X 2bouts (rest between) in room.  SpO2 dropped to 86% with each bout while on 2 LPM   Stairs             Wheelchair Mobility    Modified Rankin (Stroke Patients Only)       Balance                                            Cognition Arousal/Alertness: Awake/alert Behavior During Therapy: WFL for tasks assessed/performed Overall Cognitive Status: Within  Functional Limits for tasks assessed                                        Exercises      General Comments        Pertinent Vitals/Pain Pain Assessment: No/denies pain    Home Living                      Prior Function            PT Goals (current goals can now be found in the care plan section) Progress towards PT goals: Progressing toward goals    Frequency    Min 3X/week      PT Plan      Co-evaluation              AM-PAC PT "6 Clicks" Mobility   Outcome Measure  Help needed turning from your back to your side  while in a flat bed without using bedrails?: A Little Help needed moving from lying on your back to sitting on the side of a flat bed without using bedrails?: A Little Help needed moving to and from a bed to a chair (including a wheelchair)?: A Little Help needed standing up from a chair using your arms (e.g., wheelchair or bedside chair)?: A Little Help needed to walk in hospital room?: A Little Help needed climbing 3-5 steps with a railing? : A Lot 6 Click Score: 17    End of Session Equipment Utilized During Treatment: Gait belt;Oxygen Activity Tolerance: Patient tolerated treatment well;Patient limited by fatigue Patient left: in bed;with call bell/phone within reach Nurse Communication: Mobility status PT Visit Diagnosis: Other abnormalities of gait and mobility (R26.89);Unsteadiness on feet (R26.81);Muscle weakness (generalized) (M62.81)     Time: 0630-1601 PT Time Calculation (min) (ACUTE ONLY): 16 min  Charges:  $Therapeutic Activity: 8-22 mins                     Teena Irani, PTA/CLT [place], [place] Strieter B 12/09/2020, 4:31 PM

## 2020-12-09 NOTE — TOC Progression Note (Signed)
Transition of Care [facility]) - Progression Note    Patient Details  Name: LIARA HOLM MRN: [id number] Date of Birth: [birth year]  Transition [facility]) CM/SW Contact  Boneta Lucks, RN Phone Number: 12/09/2020, 3:43 PM  Clinical Narrative:   [facility] offered bed, then realized patient will be day 10 day tomorrow. They will not accept at day 10. Sent out to other SNF's for bed offers. Some has stated they will not take COVID positive until day 14.  [facility] possibly will take patient on Thursday.  Patient not wanting to go to [place].  TOC called son- Charlotte Crumb.  He wants patient to go to SNF, and will have his sister call her as well.  She has not had her CPAP aide for several weeks. Her apartment has been treated 5 times for bed bugs. If she goes home needs home health. She has used [facility] in the past.     Expected Discharge Plan: [place] Barriers to Discharge: Continued Medical Work up  Expected Discharge Plan and Services Expected Discharge Plan: [place] arrangements for the past 2 months: Apartment                  Readmission Risk Interventions Readmission Risk Prevention Plan 12/08/2020  Transportation Screening Complete  HRI or Home Care Consult Complete  Social Work Consult for [place] Planning/Counseling Complete  Palliative Care Screening Not Applicable  Medication Review Press photographer) Complete  Some recent data might be hidden

## 2020-12-09 NOTE — Progress Notes (Signed)
Inpatient Diabetes Program Recommendations  AACE/ADA: New Consensus Statement on Inpatient Glycemic Control   Target Ranges:  Prepandial:   less than 140 mg/dL      Peak postprandial:   less than 180 mg/dL (1-2 hours)      Critically ill patients:  140 - 180 mg/dL   Results for Claudia Keller, Claudia Keller (MRN [id number]) as of 12/09/2020 09:04  Ref. Range 12/08/2020 17:02 12/08/2020 21:27 12/09/2020 08:30  Glucose-Capillary Latest Ref Range: 70 - 99 mg/dL 266 (H) 352 (H) 339 (H)   Review of Glycemic Control  Diabetes history: DM2 Outpatient Diabetes medications: Lantus 100 units QHS, Humalog 15-24 units TID with meals, Tradjenta 5 mg daily, Metformin 500 mg BID Current orders for Inpatient glycemic control: Levemir 20 units BID, Novolog 0-15 units TID with meals, Novolog 0-5 units QHS, Novolog 6 units TID with meals, Tradjenta 5 mg daily; Solumedrol 51.875 mg Q12H  Inpatient Diabetes Program Recommendations:    Insulin: Noted Levemir was only given one time on 12/08/20 (at 22:03). As a result of just getting Levemir one time on 12/08/20, fasting glucose 339 mg/dl this morning. Patient has already received Levemir 20 units this morning at 8:32 am.   Thanks, Barnie Alderman, RN, MSN, CDE Diabetes Coordinator Inpatient Diabetes Program [phone] (Team Pager from 8am to 5pm)

## 2020-12-10 DIAGNOSIS — J069 Acute upper respiratory infection, unspecified: Secondary | ICD-10-CM

## 2020-12-10 LAB — CBC WITH DIFFERENTIAL/PLATELET
Abs Immature Granulocytes: 0.77 10*3/uL — ABNORMAL HIGH (ref 0.00–0.07)
Basophils Absolute: 0.1 10*3/uL (ref 0.0–0.1)
Basophils Relative: 0 %
Eosinophils Absolute: 0 10*3/uL (ref 0.0–0.5)
Eosinophils Relative: 0 %
HCT: 41.6 % (ref 36.0–46.0)
Hemoglobin: 13.7 g/dL (ref 12.0–15.0)
Immature Granulocytes: 5 %
Lymphocytes Relative: 5 %
Lymphs Abs: 0.8 10*3/uL (ref 0.7–4.0)
MCH: 30.6 pg (ref 26.0–34.0)
MCHC: 32.9 g/dL (ref 30.0–36.0)
MCV: 92.9 fL (ref 80.0–100.0)
Monocytes Absolute: 0.8 10*3/uL (ref 0.1–1.0)
Monocytes Relative: 6 %
Neutro Abs: 12.3 10*3/uL — ABNORMAL HIGH (ref 1.7–7.7)
Neutrophils Relative %: 84 %
Platelets: 237 10*3/uL (ref 150–400)
RBC: 4.48 MIL/uL (ref 3.87–5.11)
RDW: 14 % (ref 11.5–15.5)
WBC: 14.7 10*3/uL — ABNORMAL HIGH (ref 4.0–10.5)
nRBC: 0 % (ref 0.0–0.2)

## 2020-12-10 LAB — COMPREHENSIVE METABOLIC PANEL
ALT: 41 U/L (ref 0–44)
AST: 38 U/L (ref 15–41)
Albumin: 2.7 g/dL — ABNORMAL LOW (ref 3.5–5.0)
Alkaline Phosphatase: 75 U/L (ref 38–126)
Anion gap: 9 (ref 5–15)
BUN: 31 mg/dL — ABNORMAL HIGH (ref 8–23)
CO2: 31 mmol/L (ref 22–32)
Calcium: 9.5 mg/dL (ref 8.9–10.3)
Chloride: 98 mmol/L (ref 98–111)
Creatinine, Ser: 0.75 mg/dL (ref 0.44–1.00)
GFR, Estimated: >60 mL/min (ref >60)
Glucose, Bld: 178 mg/dL — ABNORMAL HIGH (ref 70–99)
Potassium: 4.1 mmol/L (ref 3.5–5.1)
Sodium: 138 mmol/L (ref 135–145)
Total Bilirubin: 0.5 mg/dL (ref 0.3–1.2)
Total Protein: 6.4 g/dL — ABNORMAL LOW (ref 6.5–8.1)

## 2020-12-10 LAB — GLUCOSE, CAPILLARY
Glucose-Capillary: 145 mg/dL — ABNORMAL HIGH (ref 70–99)
Glucose-Capillary: 115 mg/dL — ABNORMAL HIGH (ref 70–99)
Glucose-Capillary: 72 mg/dL (ref 70–99)
Glucose-Capillary: 247 mg/dL — ABNORMAL HIGH (ref 70–99)

## 2020-12-10 LAB — C-REACTIVE PROTEIN: CRP: 0.6 mg/dL (ref <1.0)

## 2020-12-10 LAB — D-DIMER, QUANTITATIVE: D-Dimer, Quant: 2.2 ug/mL-FEU — ABNORMAL HIGH (ref 0.00–0.50)

## 2020-12-10 LAB — PHOSPHORUS: Phosphorus: 3.1 mg/dL (ref 2.5–4.6)

## 2020-12-10 LAB — MAGNESIUM: Magnesium: 2.2 mg/dL (ref 1.7–2.4)

## 2020-12-10 LAB — FERRITIN: Ferritin: 294 ng/mL (ref 11–307)

## 2020-12-10 NOTE — Progress Notes (Signed)
Patient Demographics:    Claudia Keller, is a 70 y.o. female, DOB - 1951/09/16, [id number]  Admit date - 12/06/2020   Admitting Physician Courage Denton Brick, MD  Outpatient Primary MD for the patient is Rosita Fire, MD  LOS - 3   Chief Complaint  Patient presents with  . Hyperglycemia  . Shortness of Breath        Subjective:    Claudia Keller no new complaints today. Still has some pain in her foot related to fracture. No shortness of breath  Assessment  & Plan :    Principal Problem:   Pneumonia due to COVID-19 virus Active Problems:   Closed fracture of metatarsal bone   Hypercholesterolemia   Tobacco abuse   Essential hypertension, benign   Primary hypothyroidism   Noncompliance with medication regimen   Acute respiratory failure with hypoxia (HCC)   Leukocytosis   Hyperglycemia due to diabetes mellitus (HCC)   Hypoalbuminemia   Elevated AST (SGOT)   Obesity (BMI 30-39.9)   COPD (chronic obstructive pulmonary disease) (HCC)   GERD (gastroesophageal reflux disease)   Diabetic neuropathy (HCC)   Depression   Anxiety   Acute respiratory disease due to COVID-19 virus   Brief Summary:- 70 y.o.femalewith medical history significant forCOPD, morbid obesity, hypertension, hypothyroidism, hyperlipidemia, type 2 diabetes mellitus with nephropathy and neuropathy, tobacco abuse and depression/anxiety readmitted on 12/07/2020 after being recently discharged on 12/03/2020 post treatment for Covid pneumonia -Medically improved, now awaiting transfer to SNF rehab    A/p  1)Acute hypoxic respiratory failure secondary to COVID-19 infection/Pneumonia--- The treatment plan and use of medications for treatment of COVID-19 infectionand possible side effects were discussed with patient --Currently requiring 2 L of oxygen via nasal cannula -----Patient verbalizes understanding and agrees to  treatment protocols  --Patient is positive for COVID-19 infection, chest x-ray with findings of infiltrates/opacities,  patient is tachypneic/hypoxic and requiring continuous supplemental oxygen---patient meets criteria for initiation of Remdesivir AND Steroid therapy per protocol  --Check and trend inflammatory markers including D-dimer, ferritin and  CRP---also follow CBC and CMP --Supplemental oxygen to keep O2 sats above 93% -Follow serial chest x-rays and ABGs as indicated --- Encourage prone positioning for More than 16 hours/day in increments of 2 to 3 hours at a time if able to tolerate --Attempt to maintain euvolemic state --Zinc and vitamin C as ordered -Albuterol inhaler as needed -Accu-Cheks/fingersticks while on high-dose steroids -PPI while on high-dose steroids -Treated with IV Solu-Medrol, baricitinib, bronchodilators -Patient recently treated with IV remdesivir -Okay to transition to p.o. prednisone COVID-19 Labs  Recent Labs    12/08/20 0533 12/09/20 0422 12/10/20 0601  DDIMER 2.13* 2.08* 2.20*  FERRITIN 227 293 294  CRP 5.0* 1.4* 0.6    Lab Results  Component Value Date   SARSCOV2NAA POSITIVE (A) 12/01/2020   [place] NEGATIVE 03/17/2020   [place] Not Detected 09/07/2019     2)New onset A. fib with RVR in setting of acute hypoxic respiratory failure secondary to Covid infection/pneumonia---- echo with preserved EF of 65 to 70%, -TSH is 0.049 reflecting over replacement of thyroid-- hold levothyroxine -No prior history of A. fib, will need anticoagulation if A. fib persist -Rate control is improved on IV Cardizem initially, patient is now on oral Cardizem  3)DM2-A1c is 10.2 reflecting uncontrolled DM with hyperglycemia PTA -Anticipate further worsening hyperglycemia with steroids C/n  Levemir to 20 units twice daily -Continue linagliptin and sliding scale insulin coverage  4) hypothyroidism--TSH is 0.049 reflecting over replacement of  thyroid--with hold levothyroxine  5) bipolar disorder--- stable, continues Seroquel, Paxil and Remeron  6) tobacco abuse and COPD--smokes 2 packs daily, c/n  nicotine patch -Steroids and bronchodilators as above #1  7)HTN--- continue lisinopril 20 mg daily stop amlodipine in order to allow for  Cardizem use for A. fib as above #2  8) social/ethics--- lives alone,   she is a full code  9) second and third phalanges fracture of the left foot--- conservative management  10) leukocytosis--- suspect secondary to steroid therapy  11) generalized weakness and deconditioning--- PT eval appreciated recommends SNF rehab  12) COPD and tobacco abuse--patient smokes 2 packs a day, try nicotine patch -Steroids as above  Disposition/Need for in-Hospital Stay- patient unable to be discharged at this time due to hypoxic respiratory failure secondary to Covid pneumonia and A. fib with RVR --Medically improved, now awaiting transfer to SNF rehab  Status is: Inpatient  Remains inpatient appropriate because:Please see above   Disposition: The patient is from: Home              Anticipated d/c is to: SNF              Anticipated d/c date is: 1 day              Patient currently is medically stable to d/c. -Medically improved, now awaiting transfer to SNF rehab  Code Status :  -  Code Status: Full Code   Family Communication:    NA (patient is alert, awake and coherent)   Consults  :  na  DVT Prophylaxis  :   - SCDs *  enoxaparin (LOVENOX) injection 40 mg Start: 12/10/20 1000 SCDs Start: 12/07/20 0207    Lab Results  Component Value Date   PLT 237 12/10/2020    Inpatient Medications  Scheduled Meds: . albuterol  2 puff Inhalation TID  . vitamin C  500 mg Oral Daily  . atorvastatin  20 mg Oral QHS  . baricitinib  4 mg Oral Daily  . diltiazem  30 mg Oral Q6H  . enoxaparin (LOVENOX) injection  40 mg Subcutaneous Q24H  . feeding supplement (GLUCERNA SHAKE)  237 mL Oral TID BM   . gabapentin  100 mg Oral TID  . insulin aspart  0-15 Units Subcutaneous TID WC  . insulin aspart  0-5 Units Subcutaneous QHS  . insulin aspart  6 Units Subcutaneous TID WC  . insulin detemir  20 Units Subcutaneous BID  . linagliptin  5 mg Oral Daily  . lisinopril  20 mg Oral Daily  . mirtazapine  7.5 mg Oral QHS  . nicotine  21 mg Transdermal Daily  . pantoprazole (PROTONIX) IV  40 mg Intravenous QHS  . PARoxetine  40 mg Oral Daily  . predniSONE  50 mg Oral Daily  . QUEtiapine  100 mg Oral QHS  . zinc sulfate  220 mg Oral Daily   Continuous Infusions:  PRN Meds:.acetaminophen, chlorpheniramine-HYDROcodone, guaiFENesin-dextromethorphan    Anti-infectives (From admission, onward)   None        Objective:   Vitals:   12/10/20 1418 12/10/20 1429 12/10/20 1457 12/10/20 1637  BP:  (!) 142/49  (!) 130/105  Pulse:  69  72  Resp:  16    Temp:  98.1 F (36.7 C)  98.1 F (36.7 C)  TempSrc:    Oral  SpO2: 93% 98% 95% 100%  Weight:      Height:        Wt Readings from Last 3 Encounters:  12/08/20 73.3 kg  12/01/20 104.3 kg  03/19/20 104.1 kg     Intake/Output Summary (Last 24 hours) at 12/10/2020 1927 Last data filed at 12/10/2020 1835 Gross per 24 hour  Intake 870 ml  Output --  Net 870 ml   Physical Exam  General exam: Alert, awake, oriented x 3 Respiratory system: Clear to auscultation. Respiratory effort normal. Cardiovascular system:RRR. No murmurs, rubs, gallops.    Data Review:   Micro Results Recent Results (from the past 240 hour(s))  SARS CORONAVIRUS 2 (TAT 6-24 HRS) Nasopharyngeal Nasopharyngeal Swab     Status: Abnormal   Collection Time: 12/01/20 10:35 AM   Specimen: Nasopharyngeal Swab  Result Value Ref Range Status   SARS Coronavirus 2 POSITIVE (A) NEGATIVE Final    Comment: (NOTE) SARS-CoV-2 target nucleic acids are DETECTED.  The SARS-CoV-2 RNA is generally detectable in upper and lower respiratory specimens during the acute phase of  infection. Positive results are indicative of the presence of SARS-CoV-2 RNA. Clinical correlation with patient history and other diagnostic information is  necessary to determine patient infection status. Positive results do not rule out bacterial infection or co-infection with other viruses.  The expected result is Negative.  Fact Sheet for Patients: SugarRoll.be  Fact Sheet for Healthcare Providers: https://www.woods-mathews.com/  This test is not yet approved or cleared by the [place] FDA and  has been authorized for detection and/or diagnosis of SARS-CoV-2 by FDA under an Emergency Use Authorization (EUA). This EUA will remain  in effect (meaning this test can be used) for the duration of the COVID-19 declaration under Section 564(b)(1) of the Act, 21 U. S.C. section 360bbb-3(b)(1), unless the authorization is terminated or revoked sooner.   Performed at [facility], [place] [address] [place], [place] [address]    Radiology Reports CT Chest Wo Contrast  Result Date: 12/01/2020 CLINICAL DATA:  Decreased oxygen saturation EXAM: CT CHEST WITHOUT CONTRAST TECHNIQUE: Multidetector CT imaging of the chest was performed following the standard protocol without IV contrast. COMPARISON:  Chest radiograph December 01, 2020 FINDINGS: Cardiovascular: There is no thoracic aortic aneurysm. There are foci of great vessel calcification. There are foci aortic atherosclerosis as well as multiple foci of coronary artery calcification. A small amount of pleural fluid is within physiologic range. No pericardial thickening evident. Mediastinum/Nodes: Thyroid is diminutive. No thyroid lesions are evident. There is an aortopulmonary window lymph node measuring 1.2 x 1.0 cm. There is a subcarinal lymph node measuring 1.3 x 1.2 cm. There are scattered subcentimeter mediastinal and axillary lymph nodes elsewhere. There is no appreciable esophageal lesion.  Lungs/Pleura: There is scarring in the apices. There are scattered areas of ill-defined airspace opacity throughout the lungs bilaterally without consolidation. No pleural effusion. No pneumothorax. Trachea and major bronchial structures appear patent. Upper Abdomen: There is upper abdominal aortic atherosclerosis. Gallbladder is absent. Visualized upper abdominal structures otherwise appear unremarkable. Musculoskeletal: No blastic or lytic bone lesions. No evident chest wall lesions. IMPRESSION: 1. Multifocal airspace opacity throughout the lungs without consolidation. This appearance is felt to be indicative of atypical organism pneumonia. Correlation with COVID-19 status advised. 2. Mildly prominent aortopulmonary window and subcarinal lymph nodes likely are of reactive etiology given the underlying parenchymal lung changes. 3. Aortic atherosclerosis.  Foci of great vessel and coronary artery calcification noted. 4.  Absent gallbladder. Aortic Atherosclerosis (ICD10-I70.0). Electronically Signed   By: Lowella Grip III M.D.   On: 12/01/2020 14:15   NM Pulmonary Perfusion  Result Date: 12/01/2020 CLINICAL DATA:  Shortness of breath for months. Pulmonary embolus suspected with high probability. EXAM: NUCLEAR MEDICINE PERFUSION LUNG SCAN TECHNIQUE: Perfusion images were obtained in multiple projections after intravenous injection of radiopharmaceutical. Ventilation scans intentionally deferred if perfusion scan and chest x-ray adequate for interpretation during COVID 19 epidemic. RADIOPHARMACEUTICALS:  4.3 mCi Tc-15m MAA IV COMPARISON:  CT chest 12/01/2020.  Chest radiograph 12/01/2020. FINDINGS: Perfusion studies obtained without corresponding ventilation study. Perfusion study demonstrates normal homogeneous uptake of activity throughout both lungs. No focal perfusion defects are identified. Appearance suggest very low probability of pulmonary embolus. IMPRESSION: Normal homogeneous perfusion of both  lungs. Electronically Signed   By: Lucienne Capers M.D.   On: 12/01/2020 22:04   DG Chest Port 1 View  Result Date: 12/07/2020 CLINICAL DATA:  Hypoxia, positive COVID-19 test EXAM: PORTABLE CHEST 1 VIEW COMPARISON:  12/01/2020 FINDINGS: Cardiac shadow is stable. Aortic calcifications are seen. Increase in patchy airspace opacities bilaterally consistent with the given clinical history of COVID-19 positivity. No sizable effusion is seen. No bony abnormality is noted. IMPRESSION: Increasing airspace opacity bilaterally consistent with the given clinical history of COVID-19 positivity. Electronically Signed   By: Inez Catalina M.D.   On: 12/07/2020 00:33   DG Chest Port 1 View  Result Date: 12/01/2020 CLINICAL DATA:  Shortness of breath and hypoxia EXAM: PORTABLE CHEST 1 VIEW COMPARISON:  11/30/2017 FINDINGS: Borderline cardiomegaly likely accentuated by mediastinal fat. Interstitial coarsening and hazy density at the bases. No effusion, consolidation, or pneumothorax. Artifact from EKG leads. IMPRESSION: Hazy density in the lower lungs which could be atypical infection or vascular congestion. Electronically Signed   By: Monte Fantasia M.D.   On: 12/01/2020 10:30   DG Foot Complete Left  Result Date: 12/07/2020 CLINICAL DATA:  Fall 1 week ago with persistent foot pain, initial encounter EXAM: LEFT FOOT - COMPLETE 3+ VIEW COMPARISON:  None. FINDINGS: Undisplaced fractures of the second and third proximal phalanges are noted. No other fracture is seen. No soft tissue abnormality is noted. IMPRESSION: Fractures of the second and third proximal phalanges. Electronically Signed   By: Inez Catalina M.D.   On: 12/07/2020 00:34   ECHOCARDIOGRAM LIMITED  Result Date: 12/07/2020    ECHOCARDIOGRAM LIMITED REPORT   Patient Name:   Claudia Keller Date of Exam: 12/07/2020 Medical Rec #:  [id number]      Height:       64.0 in Accession #:    [id number]     Weight:       229.9 lb Date of Birth:  12-Dec-1950      BSA:           2.076 m Patient Age:    57 years       BP:           165/66 mmHg Patient Gender: F              HR:           88 bpm. Exam Location:  [facility] Procedure: Limited Echo, Cardiac Doppler and Color Doppler Indications:    Abnormal ECG R94.31  History:        Patient has prior history of Echocardiogram examinations, most  recent 07/15/2011. COPD; Risk Factors:Hypertension, Diabetes,                 Dyslipidemia and Current Smoker.  Sonographer:    Vickie Epley RDCS Referring Phys: [id number] [facility]  Sonographer Comments: Covid positive. IMPRESSIONS  1. Left ventricular ejection fraction, by estimation, is 65 to 70%. The left ventricle has normal function. The left ventricle has no regional wall motion abnormalities.  2. Right ventricular systolic function is normal. The right ventricular size is normal. Tricuspid regurgitation signal is inadequate for assessing PA pressure.  3. The mitral valve is normal in structure. No evidence of mitral valve regurgitation.  4. The aortic valve was not well visualized. Aortic valve regurgitation is not visualized. No aortic stenosis is present.  5. The inferior vena cava is normal in size with greater than 50% respiratory variability, suggesting right atrial pressure of 3 mmHg. FINDINGS  Left Ventricle: Left ventricular ejection fraction, by estimation, is 65 to 70%. The left ventricle has normal function. The left ventricle has no regional wall motion abnormalities. The left ventricular internal cavity size was small. There is no left ventricular hypertrophy. Right Ventricle: The right ventricular size is normal. No increase in right ventricular wall thickness. Right ventricular systolic function is normal. Tricuspid regurgitation signal is inadequate for assessing PA pressure. Pericardium: There is no evidence of pericardial effusion. Mitral Valve: The mitral valve is normal in structure. Tricuspid Valve: The tricuspid valve is normal in structure. Tricuspid  valve regurgitation is trivial. Aortic Valve: The aortic valve was not well visualized. Aortic valve regurgitation is not visualized. No aortic stenosis is present. Pulmonic Valve: The pulmonic valve was not well visualized. Pulmonic valve regurgitation is not visualized. Aorta: The aortic root is normal in size and structure. Venous: The inferior vena cava is normal in size with greater than 50% respiratory variability, suggesting right atrial pressure of 3 mmHg. IAS/Shunts: No atrial level shunt detected by color flow Doppler. LEFT VENTRICLE PLAX 2D LVIDd:         3.46 cm     Diastology LVIDs:         2.52 cm     LV e' medial:    6.42 cm/s LV PW:         0.88 cm     LV E/e' medial:  18.5 LV IVS:        0.89 cm     LV e' lateral:   6.09 cm/s LVOT diam:     2.00 cm     LV E/e' lateral: 19.5 LV SV:         73 LV SV Index:   35 LVOT Area:     3.14 cm  LV Volumes (MOD) LV vol d, MOD A4C: 64.0 ml LV vol s, MOD A4C: 23.0 ml LV SV MOD A4C:     64.0 ml LEFT ATRIUM         Index LA diam:    3.40 cm 1.64 cm/m  AORTIC VALVE LVOT Vmax:   116.00 cm/s LVOT Vmean:  77.000 cm/s LVOT VTI:    0.232 m  AORTA Ao Root diam: 3.10 cm MITRAL VALVE MV Area (PHT): 3.97 cm     SHUNTS MV Decel Time: 191 msec     Systemic VTI:  0.23 m MV E velocity: 119.00 cm/s  Systemic Diam: 2.00 cm MV A velocity: 108.00 cm/s MV E/A ratio:  1.10 Oswaldo Milian MD Electronically signed by Oswaldo Milian MD Signature Date/Time: 12/07/2020/2:03:35 PM  Final     CBC Recent Labs  Lab 12/07/20 0044 12/08/20 0533 12/09/20 0422 12/10/20 0601  WBC 20.4* 16.7* 12.5* 14.7*  HGB 13.1 13.3 13.2 13.7  HCT 40.7 41.0 40.9 41.6  PLT 247 232 214 237  MCV 93.8 93.2 94.5 92.9  MCH 30.2 30.2 30.5 30.6  MCHC 32.2 32.4 32.3 32.9  RDW 14.4 14.4 14.3 14.0  LYMPHSABS 0.6* 0.8 0.5* 0.8  MONOABS 1.0 0.7 0.5 0.8  EOSABS 0.2 0.0 0.0 0.0  BASOSABS 0.1 0.1 0.1 0.1    Chemistries  Recent Labs  Lab 12/07/20 0044 12/08/20 0533 12/09/20 0422  12/10/20 0601  NA 136 143 140 138  K 3.8 4.1 4.1 4.1  CL 100 103 100 98  CO2 23 29 29 31   GLUCOSE 397* 134* 291* 178*  BUN 41* 33* 34* 31*  CREATININE 1.05* 0.67 0.81 0.75  CALCIUM 8.9 10.1 9.8 9.5  MG  --  1.7 1.7 2.2  AST 52* 61* 45* 38  ALT 42 42 37 41  ALKPHOS 67 67 77 75  BILITOT 1.7* 0.8 0.7 0.5   ------------------------------------------------------------------------------------------------------------------ No results for input(s): CHOL, HDL, LDLCALC, TRIG, CHOLHDL, LDLDIRECT in the last 72 hours.  Lab Results  Component Value Date   HGBA1C 10.2 (H) 12/01/2020   ------------------------------------------------------------------------------------------------------------------ Recent Labs    12/08/20 0000  TSH 0.049*   ------------------------------------------------------------------------------------------------------------------ Recent Labs    12/09/20 0422 12/10/20 0601  FERRITIN 293 294    Coagulation profile No results for input(s): INR, PROTIME in the last 168 hours.  Recent Labs    12/09/20 0422 12/10/20 0601  DDIMER 2.08* 2.20*    Cardiac Enzymes No results for input(s): CKMB, TROPONINI, MYOGLOBIN in the last 168 hours.  Invalid input(s): CK ------------------------------------------------------------------------------------------------------------------    Component Value Date/Time   BNP 174.0 (H) 12/01/2020 1051   Kathie Dike M.D on 12/10/2020 at 7:27 PM  Go to [email or web address] - for contact info  [facility] - Office  [phone]     .

## 2020-12-10 NOTE — TOC Progression Note (Signed)
Transition of Care [facility]) - Progression Note    Patient Details  Name: Claudia Keller MRN: [id number] Date of Birth: 11-27-1950  [facility]) CM/SW Contact  Salome Arnt, [place] Phone Number: 12/10/2020, 3:11 PM  Clinical Narrative:  LCSW called facilities regarding SNF referral. [facility] and [facility] considering. [facility] if they can offer would not be able to take pt until Friday when she is 11 days post COVID diagnosis. TOC will continue to seek placement.         Expected Discharge Plan: Skilled Nursing Facility Barriers to Discharge: No SNF bed  Expected Discharge Plan and Services Expected Discharge Plan: [place] arrangements for the past 2 months: Apartment                                       Social Determinants of Health (SDOH) Interventions    Readmission Risk Interventions Readmission Risk Prevention Plan 12/08/2020  Transportation Screening Complete  HRI or [place] Complete  Social Work Consult for [place] Planning/Counseling Complete  Palliative Care Screening Not Applicable  Medication Review Press photographer) Complete  Some recent data might be hidden

## 2020-12-11 LAB — COMPREHENSIVE METABOLIC PANEL
ALT: 49 U/L — ABNORMAL HIGH (ref 0–44)
AST: 42 U/L — ABNORMAL HIGH (ref 15–41)
Albumin: 2.4 g/dL — ABNORMAL LOW (ref 3.5–5.0)
Alkaline Phosphatase: 68 U/L (ref 38–126)
Anion gap: 8 (ref 5–15)
BUN: 30 mg/dL — ABNORMAL HIGH (ref 8–23)
CO2: 31 mmol/L (ref 22–32)
Calcium: 9.5 mg/dL (ref 8.9–10.3)
Chloride: 102 mmol/L (ref 98–111)
Creatinine, Ser: 0.74 mg/dL (ref 0.44–1.00)
GFR, Estimated: >60 mL/min (ref >60)
Glucose, Bld: 74 mg/dL (ref 70–99)
Potassium: 3.6 mmol/L (ref 3.5–5.1)
Sodium: 141 mmol/L (ref 135–145)
Total Bilirubin: 0.7 mg/dL (ref 0.3–1.2)
Total Protein: 5.7 g/dL — ABNORMAL LOW (ref 6.5–8.1)

## 2020-12-11 LAB — CBC WITH DIFFERENTIAL/PLATELET
Abs Immature Granulocytes: 0.85 10*3/uL — ABNORMAL HIGH (ref 0.00–0.07)
Basophils Absolute: 0.1 10*3/uL (ref 0.0–0.1)
Basophils Relative: 1 %
Eosinophils Absolute: 0 10*3/uL (ref 0.0–0.5)
Eosinophils Relative: 0 %
HCT: 40.2 % (ref 36.0–46.0)
Hemoglobin: 13.1 g/dL (ref 12.0–15.0)
Immature Granulocytes: 6 %
Lymphocytes Relative: 12 %
Lymphs Abs: 1.8 10*3/uL (ref 0.7–4.0)
MCH: 30.5 pg (ref 26.0–34.0)
MCHC: 32.6 g/dL (ref 30.0–36.0)
MCV: 93.5 fL (ref 80.0–100.0)
Monocytes Absolute: 1 10*3/uL (ref 0.1–1.0)
Monocytes Relative: 7 %
Neutro Abs: 10.6 10*3/uL — ABNORMAL HIGH (ref 1.7–7.7)
Neutrophils Relative %: 74 %
Platelets: 234 10*3/uL (ref 150–400)
RBC: 4.3 MIL/uL (ref 3.87–5.11)
RDW: 14.2 % (ref 11.5–15.5)
WBC: 14.4 10*3/uL — ABNORMAL HIGH (ref 4.0–10.5)
nRBC: 0 % (ref 0.0–0.2)

## 2020-12-11 LAB — GLUCOSE, CAPILLARY
Glucose-Capillary: 80 mg/dL (ref 70–99)
Glucose-Capillary: 85 mg/dL (ref 70–99)
Glucose-Capillary: 366 mg/dL — ABNORMAL HIGH (ref 70–99)
Glucose-Capillary: 234 mg/dL — ABNORMAL HIGH (ref 70–99)

## 2020-12-11 LAB — D-DIMER, QUANTITATIVE: D-Dimer, Quant: 2.35 ug/mL-FEU — ABNORMAL HIGH (ref 0.00–0.50)

## 2020-12-11 LAB — C-REACTIVE PROTEIN: CRP: 0.6 mg/dL (ref <1.0)

## 2020-12-11 LAB — FERRITIN: Ferritin: 247 ng/mL (ref 11–307)

## 2020-12-11 LAB — PHOSPHORUS: Phosphorus: 2.9 mg/dL (ref 2.5–4.6)

## 2020-12-11 LAB — MAGNESIUM: Magnesium: 1.7 mg/dL (ref 1.7–2.4)

## 2020-12-11 MED ORDER — MELATONIN 3 MG PO TABS
6.0000 mg | ORAL_TABLET | Freq: Every day | ORAL | Status: DC
  Administered 2020-12-11: 6 mg via ORAL
  Filled 2020-12-11: qty 2

## 2020-12-11 MED ORDER — MELATONIN 5 MG PO TABS
5.0000 mg | ORAL_TABLET | Freq: Every day | ORAL | Status: DC
  Filled 2020-12-11: qty 1

## 2020-12-11 MED ORDER — ALBUTEROL SULFATE HFA 108 (90 BASE) MCG/ACT IN AERS
2.0000 | INHALATION_SPRAY | Freq: Two times a day (BID) | RESPIRATORY_TRACT | Status: DC
  Administered 2020-12-12: 2 via RESPIRATORY_TRACT

## 2020-12-11 NOTE — Progress Notes (Signed)
Patient called out to have her purewick replaced because it was leaking. When I went to replace it, I noticed pink sediment in the suction tubing. I also noticed pink drainage along the side of the purewick she wanted replaced.

## 2020-12-11 NOTE — Progress Notes (Signed)
Using purwick with not much output but several changes of bed pads since purwick leaking at times.  Bladder showed 49.  States she wears pullups at home

## 2020-12-11 NOTE — Progress Notes (Signed)
Ambulated on room air in room and was 92% on room air.  Sitting in chair now.

## 2020-12-11 NOTE — TOC Transition Note (Addendum)
Transition of Care [facility]) - CM/SW Discharge Note   Patient Details  Name: Claudia Keller MRN: [id number] Date of Birth: October 28, 1951  Transition of Care [facility]) CM/SW Contact:  Boneta Lucks, RN Phone Number: 12/11/2020, 11:28 AM   Clinical Narrative:   Patient is medically stable. Bed offer from [facility] today. Patient is accepting, She realizes she needs rehab before going home.  She will call her son Charlotte Crumb and CM also left him a message.  Ebony Hail will provide number for report as soon as possible. TOC will send clinicals and call EMS.   AddendumEbony Hail called back they cannot take patient until day 11, which will be tomorrow. MD,RN, Patient, and son updated. Plan to dc to [facility] tomorrow.   Final next level of care: [place] Barriers to Discharge: No SNF bed   Patient Goals and CMS Choice Patient states their goals for this hospitalization and ongoing recovery are:: to go to rehab. CMS Medicare.gov Compare Post Acute Care list provided to:: Patient Choice offered to / list presented to : Patient  Discharge Placement              Patient chooses bed at:  [facility]) Patient to be transferred to facility by: EMS Name of family member notified: Claudia Keller Patient and family notified of of transfer: 12/11/20    Readmission Risk Interventions Readmission Risk Prevention Plan 12/08/2020  Transportation Screening Complete  HRI or Home Care Consult Complete  Social Work Consult for [place] Planning/Counseling Complete  Palliative Care Screening Not Applicable  Medication Review Press photographer) Complete  Some recent data might be hidden

## 2020-12-11 NOTE — Progress Notes (Addendum)
Has decided she wants to go home with home health instead of SNF.  Texted Dr. Roderic Palau and Debara Pickett.

## 2020-12-11 NOTE — Progress Notes (Signed)
Patient Demographics:    Claudia Keller, is a 70 y.o. female, DOB - 1951-10-07, [id number]  Admit date - 12/06/2020   Admitting Physician Courage Denton Brick, MD  Outpatient Primary MD for the patient is Rosita Fire, MD  LOS - 4   Chief Complaint  Patient presents with  . Hyperglycemia  . Shortness of Breath        Subjective:    Claudia Keller denies any shortness of breath.  Continues to have some pain in her left foot with ambulation.  Assessment  & Plan :    Principal Problem:   Pneumonia due to COVID-19 virus Active Problems:   Closed fracture of metatarsal bone   Hypercholesterolemia   Tobacco abuse   Essential hypertension, benign   Primary hypothyroidism   Noncompliance with medication regimen   Acute respiratory failure with hypoxia (HCC)   Leukocytosis   Hyperglycemia due to diabetes mellitus (HCC)   Hypoalbuminemia   Elevated AST (SGOT)   Obesity (BMI 30-39.9)   COPD (chronic obstructive pulmonary disease) (HCC)   GERD (gastroesophageal reflux disease)   Diabetic neuropathy (HCC)   Depression   Anxiety   Acute respiratory disease due to COVID-19 virus   Brief Summary:- 70 y.o.femalewith medical history significant forCOPD, morbid obesity, hypertension, hypothyroidism, hyperlipidemia, type 2 diabetes mellitus with nephropathy and neuropathy, tobacco abuse and depression/anxiety readmitted on 12/07/2020 after being recently discharged on 12/03/2020 post treatment for Covid pneumonia -Medically improved, now awaiting transfer to SNF rehab    A/p  1)Acute hypoxic respiratory failure secondary to COVID-19 infection/Pneumonia--- The treatment plan and use of medications for treatment of COVID-19 infectionand possible side effects were discussed with patient --Currently requiring 2 L of oxygen via nasal cannula -----Patient verbalizes understanding and agrees to treatment  protocols  --Patient is positive for COVID-19 infection, chest x-ray with findings of infiltrates/opacities,  patient is tachypneic/hypoxic and requiring continuous supplemental oxygen---patient meets criteria for initiation of Remdesivir AND Steroid therapy per protocol  --Check and trend inflammatory markers including D-dimer, ferritin and  CRP---also follow CBC and CMP --Supplemental oxygen to keep O2 sats above 93% -Follow serial chest x-rays and ABGs as indicated --- Encourage prone positioning for More than 16 hours/day in increments of 2 to 3 hours at a time if able to tolerate --Attempt to maintain euvolemic state --Zinc and vitamin C as ordered -Albuterol inhaler as needed -Accu-Cheks/fingersticks while on high-dose steroids -PPI while on high-dose steroids -Treated with IV Solu-Medrol, baricitinib, bronchodilators -Patient recently treated with IV remdesivir -Okay to transition to p.o. prednisone COVID-19 Labs  Recent Labs    12/09/20 0422 12/10/20 0601 12/11/20 0643  DDIMER 2.08* 2.20* 2.35*  FERRITIN 293 294 247  CRP 1.4* 0.6 0.6    Lab Results  Component Value Date   SARSCOV2NAA POSITIVE (A) 12/01/2020   [place] NEGATIVE 03/17/2020   [place] Not Detected 09/07/2019     2)New onset A. fib with RVR in setting of acute hypoxic respiratory failure secondary to Covid infection/pneumonia---- echo with preserved EF of 65 to 70%, -TSH is 0.049 reflecting over replacement of thyroid-- hold levothyroxine -No prior history of A. fib, will need anticoagulation if A. fib persist -Rate control is improved on IV Cardizem initially, patient is now on oral Cardizem  3)DM2-A1c  is 10.2 reflecting uncontrolled DM with hyperglycemia PTA -Anticipate further worsening hyperglycemia with steroids C/n  Levemir to 20 units twice daily -Continue linagliptin and sliding scale insulin coverage  4) hypothyroidism--TSH is 0.049 reflecting over replacement of thyroid--with hold  levothyroxine  5) bipolar disorder--- stable, continues Seroquel, Paxil and Remeron  6) tobacco abuse and COPD--smokes 2 packs daily, c/n  nicotine patch -Steroids and bronchodilators as above #1  7)HTN--- continue lisinopril 20 mg daily stop amlodipine in order to allow for  Cardizem use for A. fib as above #2  8) social/ethics--- lives alone,   she is a full code  9) second and third phalanges fracture of the left foot--- conservative management  10) leukocytosis--- suspect secondary to steroid therapy  11) generalized weakness and deconditioning--- PT eval appreciated recommends SNF rehab  12) COPD and tobacco abuse--patient smokes 2 packs a day, try nicotine patch -Steroids as above  Disposition/Need for in-Hospital Stay- patient unable to be discharged at this time due to hypoxic respiratory failure secondary to Covid pneumonia and A. fib with RVR --Medically improved, now awaiting transfer to SNF rehab  Status is: Inpatient  Remains inpatient appropriate because:Please see above   Disposition: The patient is from: Home              Anticipated d/c is to: SNF              Anticipated d/c date is: 1 day              Patient currently is medically stable to d/c. -Medically improved, now awaiting transfer to SNF rehab  Code Status :  -  Code Status: Full Code   Family Communication:    NA (patient is alert, awake and coherent)   Consults  :  na  DVT Prophylaxis  :   - SCDs *  enoxaparin (LOVENOX) injection 40 mg Start: 12/10/20 1000 SCDs Start: 12/07/20 0207    Lab Results  Component Value Date   PLT 234 12/11/2020    Inpatient Medications  Scheduled Meds: . albuterol  2 puff Inhalation TID  . vitamin C  500 mg Oral Daily  . atorvastatin  20 mg Oral QHS  . baricitinib  4 mg Oral Daily  . diltiazem  30 mg Oral Q6H  . enoxaparin (LOVENOX) injection  40 mg Subcutaneous Q24H  . feeding supplement (GLUCERNA SHAKE)  237 mL Oral TID BM  . gabapentin  100  mg Oral TID  . insulin aspart  0-15 Units Subcutaneous TID WC  . insulin aspart  0-5 Units Subcutaneous QHS  . insulin aspart  6 Units Subcutaneous TID WC  . insulin detemir  20 Units Subcutaneous BID  . linagliptin  5 mg Oral Daily  . lisinopril  20 mg Oral Daily  . mirtazapine  7.5 mg Oral QHS  . nicotine  21 mg Transdermal Daily  . pantoprazole (PROTONIX) IV  40 mg Intravenous QHS  . PARoxetine  40 mg Oral Daily  . predniSONE  50 mg Oral Daily  . QUEtiapine  100 mg Oral QHS  . zinc sulfate  220 mg Oral Daily   Continuous Infusions:  PRN Meds:.acetaminophen, chlorpheniramine-HYDROcodone, guaiFENesin-dextromethorphan    Anti-infectives (From admission, onward)   None        Objective:   Vitals:   12/11/20 1432 12/11/20 1433 12/11/20 1541 12/11/20 2002  BP: (!) 137/57   (!) 156/82  Pulse: (!) 111 76  90  Resp: 16     Temp: 98.3  F (36.8 C)   98.3 F (36.8 C)  TempSrc: Oral   Oral  SpO2: 92% 97% 98% 93%  Weight:      Height:        Wt Readings from Last 3 Encounters:  12/08/20 73.3 kg  12/01/20 104.3 kg  03/19/20 104.1 kg     Intake/Output Summary (Last 24 hours) at 12/11/2020 2004 Last data filed at 12/11/2020 0500 Gross per 24 hour  Intake 120 ml  Output 400 ml  Net -280 ml   Physical Exam  General exam: Alert, awake, oriented x 3 Respiratory system: Clear to auscultation. Respiratory effort normal. Cardiovascular system:RRR. No murmurs, rubs, gallops.     Data Review:   Micro Results No results found for this or any previous visit (from the past 240 hour(s)). Radiology Reports CT Chest Wo Contrast  Result Date: 12/01/2020 CLINICAL DATA:  Decreased oxygen saturation EXAM: CT CHEST WITHOUT CONTRAST TECHNIQUE: Multidetector CT imaging of the chest was performed following the standard protocol without IV contrast. COMPARISON:  Chest radiograph December 01, 2020 FINDINGS: Cardiovascular: There is no thoracic aortic aneurysm. There are foci of great  vessel calcification. There are foci aortic atherosclerosis as well as multiple foci of coronary artery calcification. A small amount of pleural fluid is within physiologic range. No pericardial thickening evident. Mediastinum/Nodes: Thyroid is diminutive. No thyroid lesions are evident. There is an aortopulmonary window lymph node measuring 1.2 x 1.0 cm. There is a subcarinal lymph node measuring 1.3 x 1.2 cm. There are scattered subcentimeter mediastinal and axillary lymph nodes elsewhere. There is no appreciable esophageal lesion. Lungs/Pleura: There is scarring in the apices. There are scattered areas of ill-defined airspace opacity throughout the lungs bilaterally without consolidation. No pleural effusion. No pneumothorax. Trachea and major bronchial structures appear patent. Upper Abdomen: There is upper abdominal aortic atherosclerosis. Gallbladder is absent. Visualized upper abdominal structures otherwise appear unremarkable. Musculoskeletal: No blastic or lytic bone lesions. No evident chest wall lesions. IMPRESSION: 1. Multifocal airspace opacity throughout the lungs without consolidation. This appearance is felt to be indicative of atypical organism pneumonia. Correlation with COVID-19 status advised. 2. Mildly prominent aortopulmonary window and subcarinal lymph nodes likely are of reactive etiology given the underlying parenchymal lung changes. 3. Aortic atherosclerosis. Foci of great vessel and coronary artery calcification noted. 4.  Absent gallbladder. Aortic Atherosclerosis (ICD10-I70.0). Electronically Signed   By: Lowella Grip III M.D.   On: 12/01/2020 14:15   NM Pulmonary Perfusion  Result Date: 12/01/2020 CLINICAL DATA:  Shortness of breath for months. Pulmonary embolus suspected with high probability. EXAM: NUCLEAR MEDICINE PERFUSION LUNG SCAN TECHNIQUE: Perfusion images were obtained in multiple projections after intravenous injection of radiopharmaceutical. Ventilation scans  intentionally deferred if perfusion scan and chest x-ray adequate for interpretation during COVID 19 epidemic. RADIOPHARMACEUTICALS:  4.3 mCi Tc-7m MAA IV COMPARISON:  CT chest 12/01/2020.  Chest radiograph 12/01/2020. FINDINGS: Perfusion studies obtained without corresponding ventilation study. Perfusion study demonstrates normal homogeneous uptake of activity throughout both lungs. No focal perfusion defects are identified. Appearance suggest very low probability of pulmonary embolus. IMPRESSION: Normal homogeneous perfusion of both lungs. Electronically Signed   By: Lucienne Capers M.D.   On: 12/01/2020 22:04   DG Chest Port 1 View  Result Date: 12/07/2020 CLINICAL DATA:  Hypoxia, positive COVID-19 test EXAM: PORTABLE CHEST 1 VIEW COMPARISON:  12/01/2020 FINDINGS: Cardiac shadow is stable. Aortic calcifications are seen. Increase in patchy airspace opacities bilaterally consistent with the given clinical history of COVID-19 positivity. No sizable effusion  is seen. No bony abnormality is noted. IMPRESSION: Increasing airspace opacity bilaterally consistent with the given clinical history of COVID-19 positivity. Electronically Signed   By: Inez Catalina M.D.   On: 12/07/2020 00:33   DG Chest Port 1 View  Result Date: 12/01/2020 CLINICAL DATA:  Shortness of breath and hypoxia EXAM: PORTABLE CHEST 1 VIEW COMPARISON:  11/30/2017 FINDINGS: Borderline cardiomegaly likely accentuated by mediastinal fat. Interstitial coarsening and hazy density at the bases. No effusion, consolidation, or pneumothorax. Artifact from EKG leads. IMPRESSION: Hazy density in the lower lungs which could be atypical infection or vascular congestion. Electronically Signed   By: Monte Fantasia M.D.   On: 12/01/2020 10:30   DG Foot Complete Left  Result Date: 12/07/2020 CLINICAL DATA:  Fall 1 week ago with persistent foot pain, initial encounter EXAM: LEFT FOOT - COMPLETE 3+ VIEW COMPARISON:  None. FINDINGS: Undisplaced fractures of  the second and third proximal phalanges are noted. No other fracture is seen. No soft tissue abnormality is noted. IMPRESSION: Fractures of the second and third proximal phalanges. Electronically Signed   By: Inez Catalina M.D.   On: 12/07/2020 00:34   ECHOCARDIOGRAM LIMITED  Result Date: 12/07/2020    ECHOCARDIOGRAM LIMITED REPORT   Patient Name:   LYNDE AUGER Date of Exam: 12/07/2020 Medical Rec #:  [id number]      Height:       64.0 in Accession #:    [id number]     Weight:       229.9 lb Date of Birth:  09-20-1951      BSA:          2.076 m Patient Age:    86 years       BP:           165/66 mmHg Patient Gender: F              HR:           88 bpm. Exam Location:  [facility] Procedure: Limited Echo, Cardiac Doppler and Color Doppler Indications:    Abnormal ECG R94.31  History:        Patient has prior history of Echocardiogram examinations, most                 recent 07/15/2011. COPD; Risk Factors:Hypertension, Diabetes,                 Dyslipidemia and Current Smoker.  Sonographer:    Vickie Epley RDCS Referring Phys: [id number] [facility]  Sonographer Comments: Covid positive. IMPRESSIONS  1. Left ventricular ejection fraction, by estimation, is 65 to 70%. The left ventricle has normal function. The left ventricle has no regional wall motion abnormalities.  2. Right ventricular systolic function is normal. The right ventricular size is normal. Tricuspid regurgitation signal is inadequate for assessing PA pressure.  3. The mitral valve is normal in structure. No evidence of mitral valve regurgitation.  4. The aortic valve was not well visualized. Aortic valve regurgitation is not visualized. No aortic stenosis is present.  5. The inferior vena cava is normal in size with greater than 50% respiratory variability, suggesting right atrial pressure of 3 mmHg. FINDINGS  Left Ventricle: Left ventricular ejection fraction, by estimation, is 65 to 70%. The left ventricle has normal function. The left ventricle has  no regional wall motion abnormalities. The left ventricular internal cavity size was small. There is no left ventricular hypertrophy. Right Ventricle: The right ventricular size is normal. No increase in right  ventricular wall thickness. Right ventricular systolic function is normal. Tricuspid regurgitation signal is inadequate for assessing PA pressure. Pericardium: There is no evidence of pericardial effusion. Mitral Valve: The mitral valve is normal in structure. Tricuspid Valve: The tricuspid valve is normal in structure. Tricuspid valve regurgitation is trivial. Aortic Valve: The aortic valve was not well visualized. Aortic valve regurgitation is not visualized. No aortic stenosis is present. Pulmonic Valve: The pulmonic valve was not well visualized. Pulmonic valve regurgitation is not visualized. Aorta: The aortic root is normal in size and structure. Venous: The inferior vena cava is normal in size with greater than 50% respiratory variability, suggesting right atrial pressure of 3 mmHg. IAS/Shunts: No atrial level shunt detected by color flow Doppler. LEFT VENTRICLE PLAX 2D LVIDd:         3.46 cm     Diastology LVIDs:         2.52 cm     LV e' medial:    6.42 cm/s LV PW:         0.88 cm     LV E/e' medial:  18.5 LV IVS:        0.89 cm     LV e' lateral:   6.09 cm/s LVOT diam:     2.00 cm     LV E/e' lateral: 19.5 LV SV:         73 LV SV Index:   35 LVOT Area:     3.14 cm  LV Volumes (MOD) LV vol d, MOD A4C: 64.0 ml LV vol s, MOD A4C: 23.0 ml LV SV MOD A4C:     64.0 ml LEFT ATRIUM         Index LA diam:    3.40 cm 1.64 cm/m  AORTIC VALVE LVOT Vmax:   116.00 cm/s LVOT Vmean:  77.000 cm/s LVOT VTI:    0.232 m  AORTA Ao Root diam: 3.10 cm MITRAL VALVE MV Area (PHT): 3.97 cm     SHUNTS MV Decel Time: 191 msec     Systemic VTI:  0.23 m MV E velocity: 119.00 cm/s  Systemic Diam: 2.00 cm MV A velocity: 108.00 cm/s MV E/A ratio:  1.10 Oswaldo Milian MD Electronically signed by Oswaldo Milian MD  Signature Date/Time: 12/07/2020/2:03:35 PM    Final     CBC Recent Labs  Lab 12/07/20 [id number] 12/08/20 0533 12/09/20 0422 12/10/20 0601 12/11/20 0643  WBC 20.4* 16.7* 12.5* 14.7* 14.4*  HGB 13.1 13.3 13.2 13.7 13.1  HCT 40.7 41.0 40.9 41.6 40.2  PLT 247 232 214 237 234  MCV 93.8 93.2 94.5 92.9 93.5  MCH 30.2 30.2 30.5 30.6 30.5  MCHC 32.2 32.4 32.3 32.9 32.6  RDW 14.4 14.4 14.3 14.0 14.2  LYMPHSABS 0.6* 0.8 0.5* 0.8 1.8  MONOABS 1.0 0.7 0.5 0.8 1.0  EOSABS 0.2 0.0 0.0 0.0 0.0  BASOSABS 0.1 0.1 0.1 0.1 0.1    Chemistries  Recent Labs  Lab 12/07/20 0044 12/08/20 0533 12/09/20 0422 12/10/20 0601 12/11/20 0643  NA 136 143 140 138 141  K 3.8 4.1 4.1 4.1 3.6  CL 100 103 100 98 102  CO2 23 29 29 31 31   GLUCOSE 397* 134* 291* 178* 74  BUN 41* 33* 34* 31* 30*  CREATININE 1.05* 0.67 0.81 0.75 0.74  CALCIUM 8.9 10.1 9.8 9.5 9.5  MG  --  1.7 1.7 2.2 1.7  AST 52* 61* 45* 38 42*  ALT 42 42 37 41 49*  ALKPHOS 67 67 77 75  68  BILITOT 1.7* 0.8 0.7 0.5 0.7   ------------------------------------------------------------------------------------------------------------------ No results for input(s): CHOL, HDL, LDLCALC, TRIG, CHOLHDL, LDLDIRECT in the last 72 hours.  Lab Results  Component Value Date   HGBA1C 10.2 (H) 12/01/2020   ------------------------------------------------------------------------------------------------------------------ No results for input(s): TSH, T4TOTAL, T3FREE, THYROIDAB in the last 72 hours.  Invalid input(s): FREET3 ------------------------------------------------------------------------------------------------------------------ Recent Labs    12/10/20 0601 12/11/20 0643  FERRITIN 294 247    Coagulation profile No results for input(s): INR, PROTIME in the last 168 hours.  Recent Labs    12/10/20 0601 12/11/20 0643  DDIMER 2.20* 2.35*    Cardiac Enzymes No results for input(s): CKMB, TROPONINI, MYOGLOBIN in the last 168 hours.  Invalid  input(s): CK ------------------------------------------------------------------------------------------------------------------    Component Value Date/Time   BNP 174.0 (H) 12/01/2020 1051   Kathie Dike M.D on 12/11/2020 at 8:04 PM  Go to [email or web address] - for contact info  [facility] - Office  [phone]     .

## 2020-12-12 DIAGNOSIS — S92309S Fracture of unspecified metatarsal bone(s), unspecified foot, sequela: Secondary | ICD-10-CM | POA: Diagnosis not present

## 2020-12-12 DIAGNOSIS — R0902 Hypoxemia: Secondary | ICD-10-CM | POA: Diagnosis not present

## 2020-12-12 DIAGNOSIS — R404 Transient alteration of awareness: Secondary | ICD-10-CM | POA: Diagnosis not present

## 2020-12-12 DIAGNOSIS — S92302A Fracture of unspecified metatarsal bone(s), left foot, initial encounter for closed fracture: Secondary | ICD-10-CM | POA: Diagnosis not present

## 2020-12-12 DIAGNOSIS — J1282 Pneumonia due to coronavirus disease 2019: Secondary | ICD-10-CM | POA: Diagnosis not present

## 2020-12-12 DIAGNOSIS — Z743 Need for continuous supervision: Secondary | ICD-10-CM | POA: Diagnosis not present

## 2020-12-12 DIAGNOSIS — E119 Type 2 diabetes mellitus without complications: Secondary | ICD-10-CM | POA: Diagnosis not present

## 2020-12-12 DIAGNOSIS — J069 Acute upper respiratory infection, unspecified: Secondary | ICD-10-CM | POA: Diagnosis not present

## 2020-12-12 DIAGNOSIS — J449 Chronic obstructive pulmonary disease, unspecified: Secondary | ICD-10-CM | POA: Diagnosis not present

## 2020-12-12 DIAGNOSIS — Z7401 Bed confinement status: Secondary | ICD-10-CM | POA: Diagnosis not present

## 2020-12-12 DIAGNOSIS — K219 Gastro-esophageal reflux disease without esophagitis: Secondary | ICD-10-CM | POA: Diagnosis not present

## 2020-12-12 DIAGNOSIS — U071 COVID-19: Secondary | ICD-10-CM | POA: Diagnosis not present

## 2020-12-12 DIAGNOSIS — D849 Immunodeficiency, unspecified: Secondary | ICD-10-CM | POA: Diagnosis not present

## 2020-12-12 DIAGNOSIS — E0829 Diabetes mellitus due to underlying condition with other diabetic kidney complication: Secondary | ICD-10-CM | POA: Diagnosis not present

## 2020-12-12 DIAGNOSIS — F419 Anxiety disorder, unspecified: Secondary | ICD-10-CM | POA: Diagnosis not present

## 2020-12-12 DIAGNOSIS — N390 Urinary tract infection, site not specified: Secondary | ICD-10-CM | POA: Diagnosis not present

## 2020-12-12 DIAGNOSIS — E0865 Diabetes mellitus due to underlying condition with hyperglycemia: Secondary | ICD-10-CM | POA: Diagnosis not present

## 2020-12-12 DIAGNOSIS — I10 Essential (primary) hypertension: Secondary | ICD-10-CM | POA: Diagnosis not present

## 2020-12-12 DIAGNOSIS — R69 Illness, unspecified: Secondary | ICD-10-CM | POA: Diagnosis not present

## 2020-12-12 DIAGNOSIS — Z79899 Other long term (current) drug therapy: Secondary | ICD-10-CM | POA: Diagnosis not present

## 2020-12-12 DIAGNOSIS — E785 Hyperlipidemia, unspecified: Secondary | ICD-10-CM | POA: Diagnosis not present

## 2020-12-12 DIAGNOSIS — J99 Respiratory disorders in diseases classified elsewhere: Secondary | ICD-10-CM | POA: Diagnosis not present

## 2020-12-12 DIAGNOSIS — J9601 Acute respiratory failure with hypoxia: Secondary | ICD-10-CM | POA: Diagnosis not present

## 2020-12-12 DIAGNOSIS — E1140 Type 2 diabetes mellitus with diabetic neuropathy, unspecified: Secondary | ICD-10-CM | POA: Diagnosis not present

## 2020-12-12 DIAGNOSIS — R279 Unspecified lack of coordination: Secondary | ICD-10-CM | POA: Diagnosis not present

## 2020-12-12 LAB — CBC WITH DIFFERENTIAL/PLATELET
Abs Immature Granulocytes: 0.66 10*3/uL — ABNORMAL HIGH (ref 0.00–0.07)
Basophils Absolute: 0.1 10*3/uL (ref 0.0–0.1)
Basophils Relative: 1 %
Eosinophils Absolute: 0 10*3/uL (ref 0.0–0.5)
Eosinophils Relative: 0 %
HCT: 40.3 % (ref 36.0–46.0)
Hemoglobin: 13 g/dL (ref 12.0–15.0)
Immature Granulocytes: 5 %
Lymphocytes Relative: 15 %
Lymphs Abs: 1.9 10*3/uL (ref 0.7–4.0)
MCH: 30 pg (ref 26.0–34.0)
MCHC: 32.3 g/dL (ref 30.0–36.0)
MCV: 92.9 fL (ref 80.0–100.0)
Monocytes Absolute: 0.9 10*3/uL (ref 0.1–1.0)
Monocytes Relative: 7 %
Neutro Abs: 9.4 10*3/uL — ABNORMAL HIGH (ref 1.7–7.7)
Neutrophils Relative %: 72 %
Platelets: 225 10*3/uL (ref 150–400)
RBC: 4.34 MIL/uL (ref 3.87–5.11)
RDW: 13.7 % (ref 11.5–15.5)
WBC: 12.9 10*3/uL — ABNORMAL HIGH (ref 4.0–10.5)
nRBC: 0 % (ref 0.0–0.2)

## 2020-12-12 LAB — GLUCOSE, CAPILLARY
Glucose-Capillary: 56 mg/dL — ABNORMAL LOW (ref 70–99)
Glucose-Capillary: 70 mg/dL (ref 70–99)
Glucose-Capillary: 188 mg/dL — ABNORMAL HIGH (ref 70–99)

## 2020-12-12 LAB — D-DIMER, QUANTITATIVE: D-Dimer, Quant: 2.38 ug/mL-FEU — ABNORMAL HIGH (ref 0.00–0.50)

## 2020-12-12 LAB — FERRITIN: Ferritin: 272 ng/mL (ref 11–307)

## 2020-12-12 LAB — MAGNESIUM: Magnesium: 1.5 mg/dL — ABNORMAL LOW (ref 1.7–2.4)

## 2020-12-12 LAB — COMPREHENSIVE METABOLIC PANEL
ALT: 71 U/L — ABNORMAL HIGH (ref 0–44)
AST: 44 U/L — ABNORMAL HIGH (ref 15–41)
Albumin: 2.3 g/dL — ABNORMAL LOW (ref 3.5–5.0)
Alkaline Phosphatase: 73 U/L (ref 38–126)
Anion gap: 9 (ref 5–15)
BUN: 34 mg/dL — ABNORMAL HIGH (ref 8–23)
CO2: 29 mmol/L (ref 22–32)
Calcium: 9.6 mg/dL (ref 8.9–10.3)
Chloride: 102 mmol/L (ref 98–111)
Creatinine, Ser: 0.75 mg/dL (ref 0.44–1.00)
GFR, Estimated: >60 mL/min (ref >60)
Glucose, Bld: 119 mg/dL — ABNORMAL HIGH (ref 70–99)
Potassium: 3.8 mmol/L (ref 3.5–5.1)
Sodium: 140 mmol/L (ref 135–145)
Total Bilirubin: 0.6 mg/dL (ref 0.3–1.2)
Total Protein: 5.5 g/dL — ABNORMAL LOW (ref 6.5–8.1)

## 2020-12-12 LAB — PHOSPHORUS: Phosphorus: 2.9 mg/dL (ref 2.5–4.6)

## 2020-12-12 LAB — C-REACTIVE PROTEIN: CRP: 0.6 mg/dL (ref <1.0)

## 2020-12-12 MED ORDER — ATORVASTATIN CALCIUM 20 MG PO TABS: 20.0000 mg | ORAL_TABLET | Freq: Every day | ORAL | Status: DC

## 2020-12-12 MED ORDER — LEVOTHYROXINE SODIUM 100 MCG PO TABS: 200.0000 ug | ORAL_TABLET | Freq: Every day | ORAL | Status: DC

## 2020-12-12 MED ORDER — NICOTINE POLACRILEX 4 MG MT GUM: 4.0000 mg | CHEWING_GUM | OROMUCOSAL | 0 refills | Status: DC | PRN

## 2020-12-12 MED ORDER — GLUCOSE 40 % PO GEL
ORAL | Status: AC
  Filled 2020-12-12: qty 1

## 2020-12-12 MED ORDER — NOVOLOG FLEXPEN 100 UNIT/ML ~~LOC~~ SOPN: 5.0000 [IU] | PEN_INJECTOR | Freq: Three times a day (TID) | SUBCUTANEOUS | 11 refills | Status: DC

## 2020-12-12 MED ORDER — INSULIN DETEMIR 100 UNIT/ML ~~LOC~~ SOLN: 10.0000 [IU] | Freq: Two times a day (BID) | SUBCUTANEOUS | 11 refills | Status: DC

## 2020-12-12 MED ORDER — PANTOPRAZOLE SODIUM 40 MG PO TBEC: 40.0000 mg | DELAYED_RELEASE_TABLET | Freq: Every day | ORAL | Status: DC

## 2020-12-12 MED ORDER — DILTIAZEM HCL ER COATED BEADS 120 MG PO CP24: 120.0000 mg | ORAL_CAPSULE | Freq: Every day | ORAL | 11 refills | Status: DC

## 2020-12-12 MED ORDER — INSULIN DETEMIR 100 UNIT/ML ~~LOC~~ SOLN
10.0000 [IU] | Freq: Two times a day (BID) | SUBCUTANEOUS | Status: DC
  Administered 2020-12-12: 10 [IU] via SUBCUTANEOUS
  Filled 2020-12-12 (×7): qty 0.1

## 2020-12-12 MED ORDER — NICOTINE POLACRILEX 2 MG MT GUM
2.0000 mg | CHEWING_GUM | OROMUCOSAL | Status: DC | PRN
  Filled 2020-12-12: qty 1

## 2020-12-12 MED ORDER — PREDNISONE 50 MG PO TABS: 50.0000 mg | ORAL_TABLET | Freq: Every day | ORAL | 0 refills | Status: AC

## 2020-12-12 NOTE — Progress Notes (Signed)
Inpatient Diabetes Program Recommendations  AACE/ADA: New Consensus Statement on Inpatient Glycemic Control   Target Ranges:  Prepandial:   less than 140 mg/dL      Peak postprandial:   less than 180 mg/dL (1-2 hours)      Critically ill patients:  140 - 180 mg/dL   Results for CALEEN, TAAFFE (MRN [id number]) as of 12/12/2020 08:12  Ref. Range 12/11/2020 08:31 12/11/2020 11:43 12/11/2020 12:26 12/11/2020 17:00 12/11/2020 20:48 12/12/2020 07:53  Glucose-Capillary Latest Ref Range: 70 - 99 mg/dL 80  Novolog 6 units 85     Levemir 20 units 366 (H)  Novolog 21 units 234 (H)  Novolog 2 units  Levemir 20 units 56 (L)   Review of Glycemic Control  Current orders for Inpatient glycemic control: Levemir 20 units BID, Novolog 0-15 units TID with meals, Novolog 0-5 units QHS, Novolog 6 units TID with meals, Tradjenta 5 mg daily; Prednisone 50 mg daily  Inpatient Diabetes Program Recommendations:    Insulin: Please consider decreasing Levemir to 15 units BID.  Thanks, Barnie Alderman, RN, MSN, CDE Diabetes Coordinator Inpatient Diabetes Program [phone] (Team Pager from 8am to 5pm)

## 2020-12-12 NOTE — Care Management Important Message (Signed)
Important Message  Patient Details  Name: Claudia Keller MRN: [id number] Date of Birth: 1951-01-04   Medicare Important Message Given:  Yes - Important Message mailed due to current National Emergency     Tommy Medal 12/12/2020, 3:02 PM

## 2020-12-12 NOTE — Clinical Social Work Note (Signed)
EMS scheduled for transport to [place].

## 2020-12-12 NOTE — TOC Transition Note (Signed)
Transition of Care [facility]) - CM/SW Discharge Note   Patient Details  Name: Claudia Keller MRN: [id number] Date of Birth: [birth year]  [facility] CM/SW Contact:  Boneta Lucks, RN Phone Number: 12/12/2020, 1:46 PM   Clinical Narrative:   Patient medically ready and agreeing to discharge to [facility]. Patient updated son - Charlotte Crumb. He was pleased she decided to go to SNF. RN to call report. Clinical sent in the hub. TOC will call EMS when RN is ready.      Final next level of care: Skilled Nursing Facility Barriers to Discharge: Barriers Resolved   Patient Goals and CMS Choice Patient states their goals for this hospitalization and ongoing recovery are:: to go to rehab. CMS Medicare.gov Compare Post Acute Care list provided to:: Patient Choice offered to / list presented to : Patient  Discharge Placement              Patient chooses bed at:  [facility]) Patient to be transferred to facility by: EMS Name of family member notified: Johnny Patient and family notified of of transfer: 12/12/20  Discharge Plan and Services      [facility]  Readmission Risk Interventions Readmission Risk Prevention Plan 12/08/2020  Transportation Screening Complete  HRI or Home Care Consult Complete  Social Work Consult for [place] Planning/Counseling Complete  Palliative Care Screening Not Applicable  Medication Review Press photographer) Complete  Some recent data might be hidden

## 2020-12-12 NOTE — Discharge Summary (Signed)
Physician Discharge Summary  Claudia Keller FGH:[id number] DOB: 1951/04/14 DOA: 12/06/2020  PCP: Rosita Fire, MD  Admit date: 12/06/2020 Discharge date: 12/12/2020  Admitted From: Home Disposition: Skilled nursing facility  Recommendations for Outpatient Follow-up:  1. Follow up with PCP in 1-2 weeks 2. Please obtain BMP/CBC in one week 3. Follow-up with orthopedics in the next few weeks   Discharge Condition: Stable CODE STATUS: Full code Diet recommendation: Heart healthy, carb modified  Brief/Interim Summary: 70 y.o.femalewith medical history significant forCOPD, morbid obesity, hypertension, hypothyroidism, hyperlipidemia, type 2 diabetes mellitus with nephropathy and neuropathy, tobacco abuse and depression/anxietyreadmitted on 12/07/2020 after being recently discharged on 12/03/2020 post treatment for Covid pneumonia  Discharge Diagnoses:  Principal Problem:   Pneumonia due to COVID-19 virus Active Problems:   Closed fracture of metatarsal bone   Hypercholesterolemia   Tobacco abuse   Essential hypertension, benign   Primary hypothyroidism   Noncompliance with medication regimen   Acute respiratory failure with hypoxia (HCC)   Leukocytosis   Hyperglycemia due to diabetes mellitus (HCC)   Hypoalbuminemia   Elevated AST (SGOT)   Obesity (BMI 30-39.9)   COPD (chronic obstructive pulmonary disease) (HCC)   GERD (gastroesophageal reflux disease)   Diabetic neuropathy (HCC)   Depression   Anxiety   Acute respiratory disease due to COVID-19 virus  Acute hypoxic respiratory failure secondary to COVID-19 pneumonia -Currently requiring 2 L of oxygen -Patient was treated with steroids, baricitinib and remdesivir -Overall inflammatory markers have improved and respiratory status has stabilized -She will complete a total 10 days of steroids -She is able to ambulate without difficulty  Transient atrial fibrillation -This occurred in the setting of acute hypoxic  respiratory failure -Patient was treated with diltiazem and converted to sinus rhythm -She did not have any recurrence of atrial fibrillation -If she were to have any recurrence of atrial fib, will need to consider anticoagulation  Hypothyroidism -TSH was checked and found to be low at 0.049 -Considering that she was having transient atrial fibrillation, her levothyroxine dose was held -This is being resumed at reduced dose and she will need repeat thyroid studies in the next 4 weeks  Diabetes -A1c of 10.2 -She is currently on Metformin, linagliptin, basal insulin and NovoLog -Basal insulin dose has been reduced based on blood sugars -Continue to follow serial blood sugars  Bipolar disorder -Continue Seroquel, Paxil, Remeron, Depakote  Hypertension -Blood pressure stable on lisinopril and diltiazem  Fracture of second and third phalanges -Treat conservatively with postop shoe -Can consider follow-up with orthopedics if desired  Tobacco use -Patient is requesting nicotine gum  Generalized weakness -Patient seen by physical therapy with plans for skilled nursing facility.  She is agreeable to transfer.  Discharge Instructions  Discharge Instructions    Buddy tape toes   Complete by: As directed    Left 2nd and 3rd toes   Diet - low sodium heart healthy   Complete by: As directed    Increase activity slowly   Complete by: As directed    Post-op shoe   Complete by: As directed      Allergies as of 12/12/2020   No Known Allergies     Medication List    STOP taking these medications   amLODipine 5 MG tablet Commonly known as: NORVASC   insulin glargine 100 UNIT/ML injection Commonly known as: LANTUS   nicotine 21 mg/24hr patch Commonly known as: NICODERM CQ - dosed in mg/24 hours   simvastatin 40 MG tablet Commonly known as: ZOCOR  TAKE these medications   albuterol 108 (90 Base) MCG/ACT inhaler Commonly known as: VENTOLIN HFA Inhale 2 puffs into the  lungs every 4 (four) hours as needed for wheezing or shortness of breath.   ascorbic acid 500 MG tablet Commonly known as: VITAMIN C Take 1 tablet (500 mg total) by mouth daily for 15 days.   atorvastatin 20 MG tablet Commonly known as: LIPITOR Take 1 tablet (20 mg total) by mouth at bedtime.   cetirizine 10 MG tablet Commonly known as: ZYRTEC Take 10 mg by mouth daily.   colestipol 1 g tablet Commonly known as: COLESTID TAKE 2 TABLETS DAILY FOR DIARRHEA. DO NOT TAKE WITHIN 2 HOURS OF OTHER ORAL MEDICATIONS. HOLD FOR CONSTIPATION.   diltiazem 120 MG 24 hr capsule Commonly known as: Cardizem CD Take 1 capsule (120 mg total) by mouth daily.   divalproex 500 MG 24 hr tablet Commonly known as: DEPAKOTE ER Take 2 tablets (1,000 mg total) by mouth every evening.   divalproex 250 MG DR tablet Commonly known as: DEPAKOTE Take 1 tablet (250 mg total) by mouth daily.   furosemide 20 MG tablet Commonly known as: LASIX Take 20 mg by mouth daily.   gabapentin 100 MG capsule Commonly known as: NEURONTIN Take 100 mg by mouth 3 (three) times daily.   guaiFENesin-dextromethorphan 100-10 MG/5ML syrup Commonly known as: ROBITUSSIN DM Take 10 mLs by mouth every 4 (four) hours as needed for cough.   insulin detemir 100 UNIT/ML injection Commonly known as: LEVEMIR Inject 0.1 mLs (10 Units total) into the skin 2 (two) times daily.   insulin lispro 100 UNIT/ML injection Commonly known as: HUMALOG Inject 15-24 Units into the skin 3 (three) times daily with meals. Per sliding scale. 151-200=18 units; 201-250= 9 units; 251-300=20 units; 301-350=21 units; 351-400= 22 units; 401-450=23 units; 500-551=24 units.   levothyroxine 100 MCG tablet Commonly known as: SYNTHROID Take 2 tablets (200 mcg total) by mouth daily before breakfast. What changed: medication strength   linagliptin 5 MG Tabs tablet Commonly known as: TRADJENTA Take 5 mg by mouth daily.   lisinopril 20 MG tablet Commonly  known as: ZESTRIL Take 20 mg by mouth daily.   metFORMIN 500 MG tablet Commonly known as: GLUCOPHAGE Take 500 mg by mouth 2 (two) times daily.   mirtazapine 7.5 MG tablet Commonly known as: REMERON Take 1 tablet (7.5 mg total) by mouth at bedtime.   nicotine polacrilex 4 MG gum Commonly known as: Nicorette Take 1 each (4 mg total) by mouth as needed for smoking cessation.   NovoLOG FlexPen 100 UNIT/ML FlexPen Generic drug: insulin aspart Inject 5 Units into the skin in the morning, at noon, and at bedtime. What changed: how much to take   ondansetron 4 MG tablet Commonly known as: ZOFRAN Take 1 tablet (4 mg total) by mouth every 8 (eight) hours as needed for nausea or vomiting.   Oyster Shell Calcium w/D 500-200 MG-UNIT Tabs Take 1 tablet by mouth 2 (two) times daily.   pantoprazole 40 MG tablet Commonly known as: PROTONIX TAKE 1 TABLET BY MOUTH ONCE DAILY 30 MINTUES BEFORE BREAKFAST. What changed:   how much to take  how to take this  when to take this  additional instructions   PARoxetine 40 MG tablet Commonly known as: PAXIL Take 1 tablet (40 mg total) by mouth daily.   predniSONE 50 MG tablet Commonly known as: DELTASONE Take 1 tablet (50 mg total) by mouth daily for 5 days.   QUEtiapine 100  MG tablet Commonly known as: SEROQUEL Take 1 tablet (100 mg total) by mouth at bedtime.   zinc sulfate 220 (50 Zn) MG capsule Take 1 capsule (220 mg total) by mouth daily for 15 days.       Contact information for after-discharge care    [facility] .   Service: Skilled Nursing Contact information: [address] [place] [phone]                 No Known Allergies  Consultations:     Procedures/Studies: CT Chest Wo Contrast  Result Date: 12/01/2020 CLINICAL DATA:  Decreased oxygen saturation EXAM: CT CHEST WITHOUT CONTRAST TECHNIQUE: Multidetector CT imaging of the chest was  performed following the standard protocol without IV contrast. COMPARISON:  Chest radiograph December 01, 2020 FINDINGS: Cardiovascular: There is no thoracic aortic aneurysm. There are foci of great vessel calcification. There are foci aortic atherosclerosis as well as multiple foci of coronary artery calcification. A small amount of pleural fluid is within physiologic range. No pericardial thickening evident. Mediastinum/Nodes: Thyroid is diminutive. No thyroid lesions are evident. There is an aortopulmonary window lymph node measuring 1.2 x 1.0 cm. There is a subcarinal lymph node measuring 1.3 x 1.2 cm. There are scattered subcentimeter mediastinal and axillary lymph nodes elsewhere. There is no appreciable esophageal lesion. Lungs/Pleura: There is scarring in the apices. There are scattered areas of ill-defined airspace opacity throughout the lungs bilaterally without consolidation. No pleural effusion. No pneumothorax. Trachea and major bronchial structures appear patent. Upper Abdomen: There is upper abdominal aortic atherosclerosis. Gallbladder is absent. Visualized upper abdominal structures otherwise appear unremarkable. Musculoskeletal: No blastic or lytic bone lesions. No evident chest wall lesions. IMPRESSION: 1. Multifocal airspace opacity throughout the lungs without consolidation. This appearance is felt to be indicative of atypical organism pneumonia. Correlation with COVID-19 status advised. 2. Mildly prominent aortopulmonary window and subcarinal lymph nodes likely are of reactive etiology given the underlying parenchymal lung changes. 3. Aortic atherosclerosis. Foci of great vessel and coronary artery calcification noted. 4.  Absent gallbladder. Aortic Atherosclerosis (ICD10-I70.0). Electronically Signed   By: Lowella Grip III M.D.   On: 12/01/2020 14:15   NM Pulmonary Perfusion  Result Date: 12/01/2020 CLINICAL DATA:  Shortness of breath for months. Pulmonary embolus suspected with high  probability. EXAM: NUCLEAR MEDICINE PERFUSION LUNG SCAN TECHNIQUE: Perfusion images were obtained in multiple projections after intravenous injection of radiopharmaceutical. Ventilation scans intentionally deferred if perfusion scan and chest x-ray adequate for interpretation during COVID 19 epidemic. RADIOPHARMACEUTICALS:  4.3 mCi Tc-33m MAA IV COMPARISON:  CT chest 12/01/2020.  Chest radiograph 12/01/2020. FINDINGS: Perfusion studies obtained without corresponding ventilation study. Perfusion study demonstrates normal homogeneous uptake of activity throughout both lungs. No focal perfusion defects are identified. Appearance suggest very low probability of pulmonary embolus. IMPRESSION: Normal homogeneous perfusion of both lungs. Electronically Signed   By: Lucienne Capers M.D.   On: 12/01/2020 22:04   DG Chest Port 1 View  Result Date: 12/07/2020 CLINICAL DATA:  Hypoxia, positive COVID-19 test EXAM: PORTABLE CHEST 1 VIEW COMPARISON:  12/01/2020 FINDINGS: Cardiac shadow is stable. Aortic calcifications are seen. Increase in patchy airspace opacities bilaterally consistent with the given clinical history of COVID-19 positivity. No sizable effusion is seen. No bony abnormality is noted. IMPRESSION: Increasing airspace opacity bilaterally consistent with the given clinical history of COVID-19 positivity. Electronically Signed   By: Inez Catalina M.D.   On: 12/07/2020 00:33   DG Chest  Port 1 View  Result Date: 12/01/2020 CLINICAL DATA:  Shortness of breath and hypoxia EXAM: PORTABLE CHEST 1 VIEW COMPARISON:  11/30/2017 FINDINGS: Borderline cardiomegaly likely accentuated by mediastinal fat. Interstitial coarsening and hazy density at the bases. No effusion, consolidation, or pneumothorax. Artifact from EKG leads. IMPRESSION: Hazy density in the lower lungs which could be atypical infection or vascular congestion. Electronically Signed   By: Monte Fantasia M.D.   On: 12/01/2020 10:30   DG Foot Complete  Left  Result Date: 12/07/2020 CLINICAL DATA:  Fall 1 week ago with persistent foot pain, initial encounter EXAM: LEFT FOOT - COMPLETE 3+ VIEW COMPARISON:  None. FINDINGS: Undisplaced fractures of the second and third proximal phalanges are noted. No other fracture is seen. No soft tissue abnormality is noted. IMPRESSION: Fractures of the second and third proximal phalanges. Electronically Signed   By: Inez Catalina M.D.   On: 12/07/2020 00:34   ECHOCARDIOGRAM LIMITED  Result Date: 12/07/2020    ECHOCARDIOGRAM LIMITED REPORT   Patient Name:   Claudia Keller Date of Exam: 12/07/2020 Medical Rec #:  [id number]      Height:       64.0 in Accession #:    [id number]     Weight:       229.9 lb Date of Birth:  1951/07/08      BSA:          2.076 m Patient Age:    14 years       BP:           165/66 mmHg Patient Gender: F              HR:           88 bpm. Exam Location:  [facility] Procedure: Limited Echo, Cardiac Doppler and Color Doppler Indications:    Abnormal ECG R94.31  History:        Patient has prior history of Echocardiogram examinations, most                 recent 07/15/2011. COPD; Risk Factors:Hypertension, Diabetes,                 Dyslipidemia and Current Smoker.  Sonographer:    Vickie Epley RDCS Referring Phys: [id number] [facility]  Sonographer Comments: Covid positive. IMPRESSIONS  1. Left ventricular ejection fraction, by estimation, is 65 to 70%. The left ventricle has normal function. The left ventricle has no regional wall motion abnormalities.  2. Right ventricular systolic function is normal. The right ventricular size is normal. Tricuspid regurgitation signal is inadequate for assessing PA pressure.  3. The mitral valve is normal in structure. No evidence of mitral valve regurgitation.  4. The aortic valve was not well visualized. Aortic valve regurgitation is not visualized. No aortic stenosis is present.  5. The inferior vena cava is normal in size with greater than 50% respiratory variability,  suggesting right atrial pressure of 3 mmHg. FINDINGS  Left Ventricle: Left ventricular ejection fraction, by estimation, is 65 to 70%. The left ventricle has normal function. The left ventricle has no regional wall motion abnormalities. The left ventricular internal cavity size was small. There is no left ventricular hypertrophy. Right Ventricle: The right ventricular size is normal. No increase in right ventricular wall thickness. Right ventricular systolic function is normal. Tricuspid regurgitation signal is inadequate for assessing PA pressure. Pericardium: There is no evidence of pericardial effusion. Mitral Valve: The mitral valve is normal in structure. Tricuspid Valve: The tricuspid  valve is normal in structure. Tricuspid valve regurgitation is trivial. Aortic Valve: The aortic valve was not well visualized. Aortic valve regurgitation is not visualized. No aortic stenosis is present. Pulmonic Valve: The pulmonic valve was not well visualized. Pulmonic valve regurgitation is not visualized. Aorta: The aortic root is normal in size and structure. Venous: The inferior vena cava is normal in size with greater than 50% respiratory variability, suggesting right atrial pressure of 3 mmHg. IAS/Shunts: No atrial level shunt detected by color flow Doppler. LEFT VENTRICLE PLAX 2D LVIDd:         3.46 cm     Diastology LVIDs:         2.52 cm     LV e' medial:    6.42 cm/s LV PW:         0.88 cm     LV E/e' medial:  18.5 LV IVS:        0.89 cm     LV e' lateral:   6.09 cm/s LVOT diam:     2.00 cm     LV E/e' lateral: 19.5 LV SV:         73 LV SV Index:   35 LVOT Area:     3.14 cm  LV Volumes (MOD) LV vol d, MOD A4C: 64.0 ml LV vol s, MOD A4C: 23.0 ml LV SV MOD A4C:     64.0 ml LEFT ATRIUM         Index LA diam:    3.40 cm 1.64 cm/m  AORTIC VALVE LVOT Vmax:   116.00 cm/s LVOT Vmean:  77.000 cm/s LVOT VTI:    0.232 m  AORTA Ao Root diam: 3.10 cm MITRAL VALVE MV Area (PHT): 3.97 cm     SHUNTS MV Decel Time: 191 msec      Systemic VTI:  0.23 m MV E velocity: 119.00 cm/s  Systemic Diam: 2.00 cm MV A velocity: 108.00 cm/s MV E/A ratio:  1.10 Oswaldo Milian MD Electronically signed by Oswaldo Milian MD Signature Date/Time: 12/07/2020/2:03:35 PM    Final        Subjective: Denies any shortness of breath.  She is requesting has nicotine gum  Discharge Exam: Vitals:   12/11/20 2002 12/11/20 2034 12/12/20 0646 12/12/20 0801  BP: (!) 156/82  (!) 183/76   Pulse: 90  68   Resp:   18   Temp: 98.3 F (36.8 C)  97.7 F (36.5 C)   TempSrc: Oral  Oral   SpO2: 93% 91% (!) 89% 90%  Weight:      Height:        General: Pt is alert, awake, not in acute distress Cardiovascular: RRR, S1/S2 +, no rubs, no gallops Respiratory: CTA bilaterally, no wheezing, no rhonchi Abdominal: Soft, NT, ND, bowel sounds + Extremities: no edema, no cyanosis    The results of significant diagnostics from this hospitalization (including imaging, microbiology, ancillary and laboratory) are listed below for reference.     Microbiology: No results found for this or any previous visit (from the past 240 hour(s)).   Labs: BNP (last 3 results) Recent Labs    12/01/20 1051  BNP 960.4*   Basic Metabolic Panel: Recent Labs  Lab 12/08/20 0533 12/09/20 0422 12/10/20 0601 12/11/20 0643 12/12/20 0416  NA 143 140 138 141 140  K 4.1 4.1 4.1 3.6 3.8  CL 103 100 98 102 102  CO2 29 29 31 31 29   GLUCOSE 134* 291* 178* 74 119*  BUN 33* 34*  31* 30* 34*  CREATININE 0.67 0.81 0.75 0.74 0.75  CALCIUM 10.1 9.8 9.5 9.5 9.6  MG 1.7 1.7 2.2 1.7 1.5*  PHOS 2.5 2.9 3.1 2.9 2.9   Liver Function Tests: Recent Labs  Lab 12/08/20 0533 12/09/20 0422 12/10/20 0601 12/11/20 0643 12/12/20 0416  AST 61* 45* 38 42* 44*  ALT 42 37 41 49* 71*  ALKPHOS 67 77 75 68 73  BILITOT 0.8 0.7 0.5 0.7 0.6  PROT 6.4* 6.2* 6.4* 5.7* 5.5*  ALBUMIN 2.6* 2.5* 2.7* 2.4* 2.3*   No results for input(s): LIPASE, AMYLASE in the last 168 hours. No  results for input(s): AMMONIA in the last 168 hours. CBC: Recent Labs  Lab 12/08/20 0533 12/09/20 0422 12/10/20 0601 12/11/20 0643 12/12/20 0416  WBC 16.7* 12.5* 14.7* 14.4* 12.9*  NEUTROABS 14.4* 10.9* 12.3* 10.6* 9.4*  HGB 13.3 13.2 13.7 13.1 13.0  HCT 41.0 40.9 41.6 40.2 40.3  MCV 93.2 94.5 92.9 93.5 92.9  PLT 232 214 237 234 225   Cardiac Enzymes: No results for input(s): CKTOTAL, CKMB, CKMBINDEX, TROPONINI in the last 168 hours. BNP: Invalid input(s): POCBNP CBG: Recent Labs  Lab 12/11/20 1700 12/11/20 2048 12/12/20 0753 12/12/20 0842 12/12/20 1114  GLUCAP 366* 234* 56* 70 188*   D-Dimer Recent Labs    12/11/20 0643 12/12/20 0416  DDIMER 2.35* 2.38*   Hgb A1c No results for input(s): HGBA1C in the last 72 hours. Lipid Profile No results for input(s): CHOL, HDL, LDLCALC, TRIG, CHOLHDL, LDLDIRECT in the last 72 hours. Thyroid function studies No results for input(s): TSH, T4TOTAL, T3FREE, THYROIDAB in the last 72 hours.  Invalid input(s): FREET3 Anemia work up Recent Labs    12/11/20 0643 12/12/20 0416  FERRITIN 247 272   Urinalysis    Component Value Date/Time   COLORURINE AMBER (A) 12/07/2020 1807   APPEARANCEUR CLOUDY (A) 12/07/2020 1807   LABSPEC 1.025 12/07/2020 1807   PHURINE 6.0 12/07/2020 1807   GLUCOSEU 50 (A) 12/07/2020 1807   HGBUR NEGATIVE 12/07/2020 1807   BILIRUBINUR NEGATIVE 12/07/2020 1807   KETONESUR 20 (A) 12/07/2020 1807   PROTEINUR 100 (A) 12/07/2020 1807   UROBILINOGEN 1.0 01/10/2015 1754   NITRITE NEGATIVE 12/07/2020 1807   LEUKOCYTESUR TRACE (A) 12/07/2020 1807   Sepsis Labs Invalid input(s): PROCALCITONIN,  WBC,  LACTICIDVEN Microbiology No results found for this or any previous visit (from the past 240 hour(s)).   Time coordinating discharge: 89mins  SIGNED:   Kathie Dike, MD  [facility] 12/12/2020, 12:18 PM   If 7PM-7AM, please contact night-coverage [email or web address]

## 2020-12-12 NOTE — Progress Notes (Signed)
Nsg Discharge Note  Admit Date:  12/06/2020 Discharge date: 12/12/2020   Claudia Keller to be D/C'd Skilled nursing facility per MD order.  AVS completed.  Copy for chart, and copy for patient signed, and dated. Patient/caregiver able to verbalize understanding.  Discharge Medication: Allergies as of 12/12/2020   No Known Allergies     Medication List    STOP taking these medications   amLODipine 5 MG tablet Commonly known as: NORVASC   insulin glargine 100 UNIT/ML injection Commonly known as: LANTUS   nicotine 21 mg/24hr patch Commonly known as: NICODERM CQ - dosed in mg/24 hours   simvastatin 40 MG tablet Commonly known as: ZOCOR     TAKE these medications   albuterol 108 (90 Base) MCG/ACT inhaler Commonly known as: VENTOLIN HFA Inhale 2 puffs into the lungs every 4 (four) hours as needed for wheezing or shortness of breath.   ascorbic acid 500 MG tablet Commonly known as: VITAMIN C Take 1 tablet (500 mg total) by mouth daily for 15 days.   atorvastatin 20 MG tablet Commonly known as: LIPITOR Take 1 tablet (20 mg total) by mouth at bedtime.   cetirizine 10 MG tablet Commonly known as: ZYRTEC Take 10 mg by mouth daily.   colestipol 1 g tablet Commonly known as: COLESTID TAKE 2 TABLETS DAILY FOR DIARRHEA. DO NOT TAKE WITHIN 2 HOURS OF OTHER ORAL MEDICATIONS. HOLD FOR CONSTIPATION.   diltiazem 120 MG 24 hr capsule Commonly known as: Cardizem CD Take 1 capsule (120 mg total) by mouth daily.   divalproex 500 MG 24 hr tablet Commonly known as: DEPAKOTE ER Take 2 tablets (1,000 mg total) by mouth every evening.   divalproex 250 MG DR tablet Commonly known as: DEPAKOTE Take 1 tablet (250 mg total) by mouth daily.   furosemide 20 MG tablet Commonly known as: LASIX Take 20 mg by mouth daily.   gabapentin 100 MG capsule Commonly known as: NEURONTIN Take 100 mg by mouth 3 (three) times daily.   guaiFENesin-dextromethorphan 100-10 MG/5ML syrup Commonly known as:  ROBITUSSIN DM Take 10 mLs by mouth every 4 (four) hours as needed for cough.   insulin detemir 100 UNIT/ML injection Commonly known as: LEVEMIR Inject 0.1 mLs (10 Units total) into the skin 2 (two) times daily.   insulin lispro 100 UNIT/ML injection Commonly known as: HUMALOG Inject 15-24 Units into the skin 3 (three) times daily with meals. Per sliding scale. 151-200=18 units; 201-250= 9 units; 251-300=20 units; 301-350=21 units; 351-400= 22 units; 401-450=23 units; 500-551=24 units.   levothyroxine 100 MCG tablet Commonly known as: SYNTHROID Take 2 tablets (200 mcg total) by mouth daily before breakfast. What changed: medication strength   linagliptin 5 MG Tabs tablet Commonly known as: TRADJENTA Take 5 mg by mouth daily.   lisinopril 20 MG tablet Commonly known as: ZESTRIL Take 20 mg by mouth daily.   metFORMIN 500 MG tablet Commonly known as: GLUCOPHAGE Take 500 mg by mouth 2 (two) times daily.   mirtazapine 7.5 MG tablet Commonly known as: REMERON Take 1 tablet (7.5 mg total) by mouth at bedtime.   nicotine polacrilex 4 MG gum Commonly known as: Nicorette Take 1 each (4 mg total) by mouth as needed for smoking cessation.   NovoLOG FlexPen 100 UNIT/ML FlexPen Generic drug: insulin aspart Inject 5 Units into the skin in the morning, at noon, and at bedtime. What changed: how much to take   ondansetron 4 MG tablet Commonly known as: ZOFRAN Take 1 tablet (4  mg total) by mouth every 8 (eight) hours as needed for nausea or vomiting.   Oyster Shell Calcium w/D 500-200 MG-UNIT Tabs Take 1 tablet by mouth 2 (two) times daily.   pantoprazole 40 MG tablet Commonly known as: PROTONIX TAKE 1 TABLET BY MOUTH ONCE DAILY 30 MINTUES BEFORE BREAKFAST. What changed:   how much to take  how to take this  when to take this  additional instructions   PARoxetine 40 MG tablet Commonly known as: PAXIL Take 1 tablet (40 mg total) by mouth daily.   predniSONE 50 MG  tablet Commonly known as: DELTASONE Take 1 tablet (50 mg total) by mouth daily for 5 days.   QUEtiapine 100 MG tablet Commonly known as: SEROQUEL Take 1 tablet (100 mg total) by mouth at bedtime.   zinc sulfate 220 (50 Zn) MG capsule Take 1 capsule (220 mg total) by mouth daily for 15 days.       Discharge Assessment: Vitals:   12/12/20 0801 12/12/20 1446  BP:  106/80  Pulse:  77  Resp:  18  Temp:  98.1 F (36.7 C)  SpO2: 90% 94%   Skin clean, dry and intact without evidence of skin break down, no evidence of skin tears noted. IV catheter discontinued intact. Site without signs and symptoms of complications - no redness or edema noted at insertion site, patient denies c/o pain - only slight tenderness at site.  Dressing with slight pressure applied.  D/c Instructions-Education: Discharge instructions given to patient/family with verbalized understanding. D/c education completed with patient/family including follow up instructions, medication list, d/c activities limitations if indicated, with other d/c instructions as indicated by MD - patient able to verbalize understanding, all questions fully answered. Patient instructed to return to ED, call 911, or call MD for any changes in condition.  Patient escorted via [place], and D/C home via private auto.  Dorcas Mcmurray, LPN 05/13/2830 5:17 PM

## 2020-12-16 ENCOUNTER — Other Ambulatory Visit: Payer: Self-pay | Admitting: *Deleted

## 2020-12-16 DIAGNOSIS — E785 Hyperlipidemia, unspecified: Secondary | ICD-10-CM | POA: Diagnosis not present

## 2020-12-16 DIAGNOSIS — J449 Chronic obstructive pulmonary disease, unspecified: Secondary | ICD-10-CM | POA: Diagnosis not present

## 2020-12-16 DIAGNOSIS — I10 Essential (primary) hypertension: Secondary | ICD-10-CM | POA: Diagnosis not present

## 2020-12-16 DIAGNOSIS — E119 Type 2 diabetes mellitus without complications: Secondary | ICD-10-CM | POA: Diagnosis not present

## 2020-12-16 NOTE — Patient Outreach (Signed)
[place] [facility]) Care Management  12/16/2020  Claudia Keller 12-Jun-1951 [id number]  Telephone outreach to follow up on pt home status and health condition: COVID.  Unsuccessful, left message and requested a return call.  Will call again next week.  Appt Dr. Legrand Rams?  Extermination status?  Eulah Pont. Myrtie Neither, MSN, [facility] Gerontological Nurse Practitioner [facility] Care Management [phone]

## 2020-12-18 ENCOUNTER — Telehealth: Payer: Self-pay | Admitting: Internal Medicine

## 2020-12-18 DIAGNOSIS — E119 Type 2 diabetes mellitus without complications: Secondary | ICD-10-CM | POA: Diagnosis not present

## 2020-12-18 DIAGNOSIS — I10 Essential (primary) hypertension: Secondary | ICD-10-CM | POA: Diagnosis not present

## 2020-12-18 DIAGNOSIS — E785 Hyperlipidemia, unspecified: Secondary | ICD-10-CM | POA: Diagnosis not present

## 2020-12-18 DIAGNOSIS — K219 Gastro-esophageal reflux disease without esophagitis: Secondary | ICD-10-CM | POA: Diagnosis not present

## 2020-12-18 NOTE — Telephone Encounter (Signed)
   Telephone encounter was:  Unsuccessful.  12/18/2020 Name: Claudia Keller MRN: [id number] DOB: 1951-05-23  Unsuccessful outbound call made today to assist with:  related to bed bugs in her home.   Outreach Attempt:  2nd Attempt  Could not leave a message because her vm was full.  [place], Care Management Phone: [phone] Email: [email or web address]

## 2020-12-20 DIAGNOSIS — Z79899 Other long term (current) drug therapy: Secondary | ICD-10-CM | POA: Diagnosis not present

## 2020-12-23 DIAGNOSIS — E119 Type 2 diabetes mellitus without complications: Secondary | ICD-10-CM | POA: Diagnosis not present

## 2020-12-23 DIAGNOSIS — E785 Hyperlipidemia, unspecified: Secondary | ICD-10-CM | POA: Diagnosis not present

## 2020-12-23 DIAGNOSIS — K219 Gastro-esophageal reflux disease without esophagitis: Secondary | ICD-10-CM | POA: Diagnosis not present

## 2020-12-23 DIAGNOSIS — I10 Essential (primary) hypertension: Secondary | ICD-10-CM | POA: Diagnosis not present

## 2020-12-24 ENCOUNTER — Other Ambulatory Visit: Payer: Self-pay | Admitting: *Deleted

## 2020-12-24 NOTE — Patient Outreach (Signed)
[place] [facility]) Care Management  12/24/2020  Claudia Keller 1951/06/18 [id number]  Telephone outreach to pt, Unsuccessful, unable to leave a message.Called pt son, Claudia Keller and was able to talk to him today. He informs me his mother is at [facility] in [place] and has been there for a week or so.  He goes on to report that the bed bugs were eradicated but when the exterminator went back to ensure they were clear they did find some eggs. They will be getting rid of her couch and chair and try to have everything arranged for her safe discharge home this time.  Note our care guide was also trying to reach pt and has been unsuccessful. Will send her a note to update her on pt status.  Advised pt son, NP to keep case open for now.  Eulah Pont. Myrtie Neither, MSN, [facility] Gerontological Nurse Practitioner [facility] Care Management [phone]

## 2020-12-30 DIAGNOSIS — E785 Hyperlipidemia, unspecified: Secondary | ICD-10-CM | POA: Diagnosis not present

## 2020-12-30 DIAGNOSIS — I10 Essential (primary) hypertension: Secondary | ICD-10-CM | POA: Diagnosis not present

## 2020-12-30 DIAGNOSIS — J449 Chronic obstructive pulmonary disease, unspecified: Secondary | ICD-10-CM | POA: Diagnosis not present

## 2020-12-30 DIAGNOSIS — E119 Type 2 diabetes mellitus without complications: Secondary | ICD-10-CM | POA: Diagnosis not present

## 2020-12-31 DIAGNOSIS — N390 Urinary tract infection, site not specified: Secondary | ICD-10-CM | POA: Diagnosis not present

## 2021-01-02 DIAGNOSIS — E0865 Diabetes mellitus due to underlying condition with hyperglycemia: Secondary | ICD-10-CM | POA: Diagnosis not present

## 2021-01-02 DIAGNOSIS — R2681 Unsteadiness on feet: Secondary | ICD-10-CM | POA: Diagnosis not present

## 2021-01-02 DIAGNOSIS — I10 Essential (primary) hypertension: Secondary | ICD-10-CM | POA: Diagnosis not present

## 2021-01-02 DIAGNOSIS — J1282 Pneumonia due to coronavirus disease 2019: Secondary | ICD-10-CM | POA: Diagnosis not present

## 2021-01-02 DIAGNOSIS — S92912S Unspecified fracture of left toe(s), sequela: Secondary | ICD-10-CM | POA: Diagnosis not present

## 2021-01-02 DIAGNOSIS — J449 Chronic obstructive pulmonary disease, unspecified: Secondary | ICD-10-CM | POA: Diagnosis not present

## 2021-01-02 DIAGNOSIS — E119 Type 2 diabetes mellitus without complications: Secondary | ICD-10-CM | POA: Diagnosis not present

## 2021-01-02 DIAGNOSIS — K219 Gastro-esophageal reflux disease without esophagitis: Secondary | ICD-10-CM | POA: Diagnosis not present

## 2021-01-02 DIAGNOSIS — E785 Hyperlipidemia, unspecified: Secondary | ICD-10-CM | POA: Diagnosis not present

## 2021-01-03 DIAGNOSIS — Z743 Need for continuous supervision: Secondary | ICD-10-CM | POA: Diagnosis not present

## 2021-01-03 DIAGNOSIS — R5381 Other malaise: Secondary | ICD-10-CM | POA: Diagnosis not present

## 2021-01-03 DIAGNOSIS — R69 Illness, unspecified: Secondary | ICD-10-CM | POA: Diagnosis not present

## 2021-01-04 DIAGNOSIS — E1140 Type 2 diabetes mellitus with diabetic neuropathy, unspecified: Secondary | ICD-10-CM | POA: Diagnosis not present

## 2021-01-04 DIAGNOSIS — S92502D Displaced unspecified fracture of left lesser toe(s), subsequent encounter for fracture with routine healing: Secondary | ICD-10-CM | POA: Diagnosis not present

## 2021-01-04 DIAGNOSIS — S92522D Displaced fracture of medial phalanx of left lesser toe(s), subsequent encounter for fracture with routine healing: Secondary | ICD-10-CM | POA: Diagnosis not present

## 2021-01-04 DIAGNOSIS — R69 Illness, unspecified: Secondary | ICD-10-CM | POA: Diagnosis not present

## 2021-01-04 DIAGNOSIS — U071 COVID-19: Secondary | ICD-10-CM | POA: Diagnosis not present

## 2021-01-04 DIAGNOSIS — N390 Urinary tract infection, site not specified: Secondary | ICD-10-CM | POA: Diagnosis not present

## 2021-01-04 DIAGNOSIS — J1282 Pneumonia due to coronavirus disease 2019: Secondary | ICD-10-CM | POA: Diagnosis not present

## 2021-01-04 DIAGNOSIS — I10 Essential (primary) hypertension: Secondary | ICD-10-CM | POA: Diagnosis not present

## 2021-01-04 DIAGNOSIS — J440 Chronic obstructive pulmonary disease with acute lower respiratory infection: Secondary | ICD-10-CM | POA: Diagnosis not present

## 2021-01-07 ENCOUNTER — Other Ambulatory Visit: Payer: Self-pay | Admitting: *Deleted

## 2021-01-07 DIAGNOSIS — R69 Illness, unspecified: Secondary | ICD-10-CM | POA: Diagnosis not present

## 2021-01-07 DIAGNOSIS — S92502D Displaced unspecified fracture of left lesser toe(s), subsequent encounter for fracture with routine healing: Secondary | ICD-10-CM | POA: Diagnosis not present

## 2021-01-07 DIAGNOSIS — S92522D Displaced fracture of medial phalanx of left lesser toe(s), subsequent encounter for fracture with routine healing: Secondary | ICD-10-CM | POA: Diagnosis not present

## 2021-01-07 DIAGNOSIS — J440 Chronic obstructive pulmonary disease with acute lower respiratory infection: Secondary | ICD-10-CM | POA: Diagnosis not present

## 2021-01-07 DIAGNOSIS — N390 Urinary tract infection, site not specified: Secondary | ICD-10-CM | POA: Diagnosis not present

## 2021-01-07 DIAGNOSIS — J1282 Pneumonia due to coronavirus disease 2019: Secondary | ICD-10-CM | POA: Diagnosis not present

## 2021-01-07 DIAGNOSIS — E1140 Type 2 diabetes mellitus with diabetic neuropathy, unspecified: Secondary | ICD-10-CM | POA: Diagnosis not present

## 2021-01-07 DIAGNOSIS — I10 Essential (primary) hypertension: Secondary | ICD-10-CM | POA: Diagnosis not present

## 2021-01-07 DIAGNOSIS — U071 COVID-19: Secondary | ICD-10-CM | POA: Diagnosis not present
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
## 2021-01-09 DIAGNOSIS — N390 Urinary tract infection, site not specified: Secondary | ICD-10-CM | POA: Diagnosis not present

## 2021-01-09 DIAGNOSIS — J440 Chronic obstructive pulmonary disease with acute lower respiratory infection: Secondary | ICD-10-CM | POA: Diagnosis not present

## 2021-01-09 DIAGNOSIS — I10 Essential (primary) hypertension: Secondary | ICD-10-CM | POA: Diagnosis not present

## 2021-01-09 DIAGNOSIS — S92522D Displaced fracture of medial phalanx of left lesser toe(s), subsequent encounter for fracture with routine healing: Secondary | ICD-10-CM | POA: Diagnosis not present

## 2021-01-09 DIAGNOSIS — S92502D Displaced unspecified fracture of left lesser toe(s), subsequent encounter for fracture with routine healing: Secondary | ICD-10-CM | POA: Diagnosis not present

## 2021-01-09 DIAGNOSIS — J1282 Pneumonia due to coronavirus disease 2019: Secondary | ICD-10-CM | POA: Diagnosis not present

## 2021-01-09 DIAGNOSIS — U071 COVID-19: Secondary | ICD-10-CM | POA: Diagnosis not present

## 2021-01-09 DIAGNOSIS — R69 Illness, unspecified: Secondary | ICD-10-CM | POA: Diagnosis not present

## 2021-01-09 DIAGNOSIS — E1140 Type 2 diabetes mellitus with diabetic neuropathy, unspecified: Secondary | ICD-10-CM | POA: Diagnosis not present

## 2021-01-11 DIAGNOSIS — E1140 Type 2 diabetes mellitus with diabetic neuropathy, unspecified: Secondary | ICD-10-CM | POA: Diagnosis not present

## 2021-01-11 DIAGNOSIS — U071 COVID-19: Secondary | ICD-10-CM | POA: Diagnosis not present

## 2021-01-11 DIAGNOSIS — R69 Illness, unspecified: Secondary | ICD-10-CM | POA: Diagnosis not present

## 2021-01-11 DIAGNOSIS — S92522D Displaced fracture of medial phalanx of left lesser toe(s), subsequent encounter for fracture with routine healing: Secondary | ICD-10-CM | POA: Diagnosis not present

## 2021-01-11 DIAGNOSIS — N390 Urinary tract infection, site not specified: Secondary | ICD-10-CM | POA: Diagnosis not present

## 2021-01-11 DIAGNOSIS — J1282 Pneumonia due to coronavirus disease 2019: Secondary | ICD-10-CM | POA: Diagnosis not present

## 2021-01-11 DIAGNOSIS — I10 Essential (primary) hypertension: Secondary | ICD-10-CM | POA: Diagnosis not present

## 2021-01-11 DIAGNOSIS — S92502D Displaced unspecified fracture of left lesser toe(s), subsequent encounter for fracture with routine healing: Secondary | ICD-10-CM | POA: Diagnosis not present

## 2021-01-11 DIAGNOSIS — J440 Chronic obstructive pulmonary disease with acute lower respiratory infection: Secondary | ICD-10-CM | POA: Diagnosis not present

## 2021-01-12 ENCOUNTER — Other Ambulatory Visit: Payer: Self-pay

## 2021-01-12 ENCOUNTER — Other Ambulatory Visit: Payer: Self-pay | Admitting: *Deleted

## 2021-01-12 ENCOUNTER — Encounter: Payer: Self-pay | Admitting: *Deleted

## 2021-01-12 DIAGNOSIS — I10 Essential (primary) hypertension: Secondary | ICD-10-CM | POA: Diagnosis not present

## 2021-01-12 DIAGNOSIS — S92502D Displaced unspecified fracture of left lesser toe(s), subsequent encounter for fracture with routine healing: Secondary | ICD-10-CM | POA: Diagnosis not present

## 2021-01-12 DIAGNOSIS — J440 Chronic obstructive pulmonary disease with acute lower respiratory infection: Secondary | ICD-10-CM | POA: Diagnosis not present

## 2021-01-12 DIAGNOSIS — S92522D Displaced fracture of medial phalanx of left lesser toe(s), subsequent encounter for fracture with routine healing: Secondary | ICD-10-CM | POA: Diagnosis not present

## 2021-01-12 DIAGNOSIS — U071 COVID-19: Secondary | ICD-10-CM | POA: Diagnosis not present

## 2021-01-12 DIAGNOSIS — E1140 Type 2 diabetes mellitus with diabetic neuropathy, unspecified: Secondary | ICD-10-CM | POA: Diagnosis not present

## 2021-01-12 DIAGNOSIS — J1282 Pneumonia due to coronavirus disease 2019: Secondary | ICD-10-CM | POA: Diagnosis not present

## 2021-01-12 DIAGNOSIS — R69 Illness, unspecified: Secondary | ICD-10-CM | POA: Diagnosis not present

## 2021-01-12 DIAGNOSIS — N390 Urinary tract infection, site not specified: Secondary | ICD-10-CM | POA: Diagnosis not present
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
## 2021-01-14 DIAGNOSIS — S92522D Displaced fracture of medial phalanx of left lesser toe(s), subsequent encounter for fracture with routine healing: Secondary | ICD-10-CM | POA: Diagnosis not present

## 2021-01-14 DIAGNOSIS — I10 Essential (primary) hypertension: Secondary | ICD-10-CM | POA: Diagnosis not present

## 2021-01-14 DIAGNOSIS — R69 Illness, unspecified: Secondary | ICD-10-CM | POA: Diagnosis not present

## 2021-01-14 DIAGNOSIS — S92502D Displaced unspecified fracture of left lesser toe(s), subsequent encounter for fracture with routine healing: Secondary | ICD-10-CM | POA: Diagnosis not present

## 2021-01-14 DIAGNOSIS — J1282 Pneumonia due to coronavirus disease 2019: Secondary | ICD-10-CM | POA: Diagnosis not present

## 2021-01-14 DIAGNOSIS — U071 COVID-19: Secondary | ICD-10-CM | POA: Diagnosis not present

## 2021-01-14 DIAGNOSIS — E1140 Type 2 diabetes mellitus with diabetic neuropathy, unspecified: Secondary | ICD-10-CM | POA: Diagnosis not present

## 2021-01-14 DIAGNOSIS — J440 Chronic obstructive pulmonary disease with acute lower respiratory infection: Secondary | ICD-10-CM | POA: Diagnosis not present

## 2021-01-14 DIAGNOSIS — N390 Urinary tract infection, site not specified: Secondary | ICD-10-CM | POA: Diagnosis not present

## 2021-01-15 DIAGNOSIS — J440 Chronic obstructive pulmonary disease with acute lower respiratory infection: Secondary | ICD-10-CM | POA: Diagnosis not present

## 2021-01-15 DIAGNOSIS — I10 Essential (primary) hypertension: Secondary | ICD-10-CM | POA: Diagnosis not present

## 2021-01-15 DIAGNOSIS — R69 Illness, unspecified: Secondary | ICD-10-CM | POA: Diagnosis not present

## 2021-01-15 DIAGNOSIS — N390 Urinary tract infection, site not specified: Secondary | ICD-10-CM | POA: Diagnosis not present

## 2021-01-15 DIAGNOSIS — J1282 Pneumonia due to coronavirus disease 2019: Secondary | ICD-10-CM | POA: Diagnosis not present

## 2021-01-15 DIAGNOSIS — J449 Chronic obstructive pulmonary disease, unspecified: Secondary | ICD-10-CM | POA: Diagnosis not present

## 2021-01-15 DIAGNOSIS — E1165 Type 2 diabetes mellitus with hyperglycemia: Secondary | ICD-10-CM | POA: Diagnosis not present

## 2021-01-15 DIAGNOSIS — U071 COVID-19: Secondary | ICD-10-CM | POA: Diagnosis not present

## 2021-01-15 DIAGNOSIS — E1140 Type 2 diabetes mellitus with diabetic neuropathy, unspecified: Secondary | ICD-10-CM | POA: Diagnosis not present

## 2021-01-15 DIAGNOSIS — I4891 Unspecified atrial fibrillation: Secondary | ICD-10-CM | POA: Diagnosis not present

## 2021-01-15 DIAGNOSIS — S92502D Displaced unspecified fracture of left lesser toe(s), subsequent encounter for fracture with routine healing: Secondary | ICD-10-CM | POA: Diagnosis not present

## 2021-01-15 DIAGNOSIS — S92522D Displaced fracture of medial phalanx of left lesser toe(s), subsequent encounter for fracture with routine healing: Secondary | ICD-10-CM | POA: Diagnosis not present

## 2021-01-19 DIAGNOSIS — J440 Chronic obstructive pulmonary disease with acute lower respiratory infection: Secondary | ICD-10-CM | POA: Diagnosis not present

## 2021-01-19 DIAGNOSIS — U071 COVID-19: Secondary | ICD-10-CM | POA: Diagnosis not present

## 2021-01-19 DIAGNOSIS — S92522D Displaced fracture of medial phalanx of left lesser toe(s), subsequent encounter for fracture with routine healing: Secondary | ICD-10-CM | POA: Diagnosis not present

## 2021-01-19 DIAGNOSIS — N390 Urinary tract infection, site not specified: Secondary | ICD-10-CM | POA: Diagnosis not present

## 2021-01-19 DIAGNOSIS — J1282 Pneumonia due to coronavirus disease 2019: Secondary | ICD-10-CM | POA: Diagnosis not present

## 2021-01-19 DIAGNOSIS — R69 Illness, unspecified: Secondary | ICD-10-CM | POA: Diagnosis not present

## 2021-01-19 DIAGNOSIS — E1140 Type 2 diabetes mellitus with diabetic neuropathy, unspecified: Secondary | ICD-10-CM | POA: Diagnosis not present

## 2021-01-19 DIAGNOSIS — S92502D Displaced unspecified fracture of left lesser toe(s), subsequent encounter for fracture with routine healing: Secondary | ICD-10-CM | POA: Diagnosis not present

## 2021-01-19 DIAGNOSIS — I10 Essential (primary) hypertension: Secondary | ICD-10-CM | POA: Diagnosis not present
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
## 2021-01-20 ENCOUNTER — Ambulatory Visit (INDEPENDENT_AMBULATORY_CARE_PROVIDER_SITE_OTHER): Payer: Medicare HMO

## 2021-01-20 ENCOUNTER — Other Ambulatory Visit: Payer: Self-pay

## 2021-01-20 ENCOUNTER — Ambulatory Visit
Admission: RE | Admit: 2021-01-20 | Discharge: 2021-01-20 | Disposition: A | Discharge status: Home / Self Care | Payer: Medicare HMO | Source: Ambulatory Visit | Attending: Emergency Medicine | Admitting: Emergency Medicine

## 2021-01-20 VITALS — BP 141/71 | HR 109 | Temp 98.1°F | Resp 18

## 2021-01-20 DIAGNOSIS — M25572 Pain in left ankle and joints of left foot: Secondary | ICD-10-CM | POA: Diagnosis not present

## 2021-01-20 DIAGNOSIS — T148XXA Other injury of unspecified body region, initial encounter: Secondary | ICD-10-CM

## 2021-01-20 DIAGNOSIS — L089 Local infection of the skin and subcutaneous tissue, unspecified: Secondary | ICD-10-CM

## 2021-01-20 DIAGNOSIS — R296 Repeated falls: Secondary | ICD-10-CM | POA: Diagnosis not present

## 2021-01-20 DIAGNOSIS — M79672 Pain in left foot: Secondary | ICD-10-CM

## 2021-01-20 DIAGNOSIS — M25472 Effusion, left ankle: Secondary | ICD-10-CM | POA: Diagnosis not present

## 2021-01-20 DIAGNOSIS — M7989 Other specified soft tissue disorders: Secondary | ICD-10-CM | POA: Diagnosis not present

## 2021-01-20 IMAGING — DX DG ANKLE COMPLETE 3+V*L*
3 series · 3 of 3 positions shown · non-contrast
Comparison: [DATE]

CLINICAL DATA: Swelling and pain multiple fall

EXAM:
LEFT ANKLE COMPLETE - 3+ VIEW

[ankle ap]
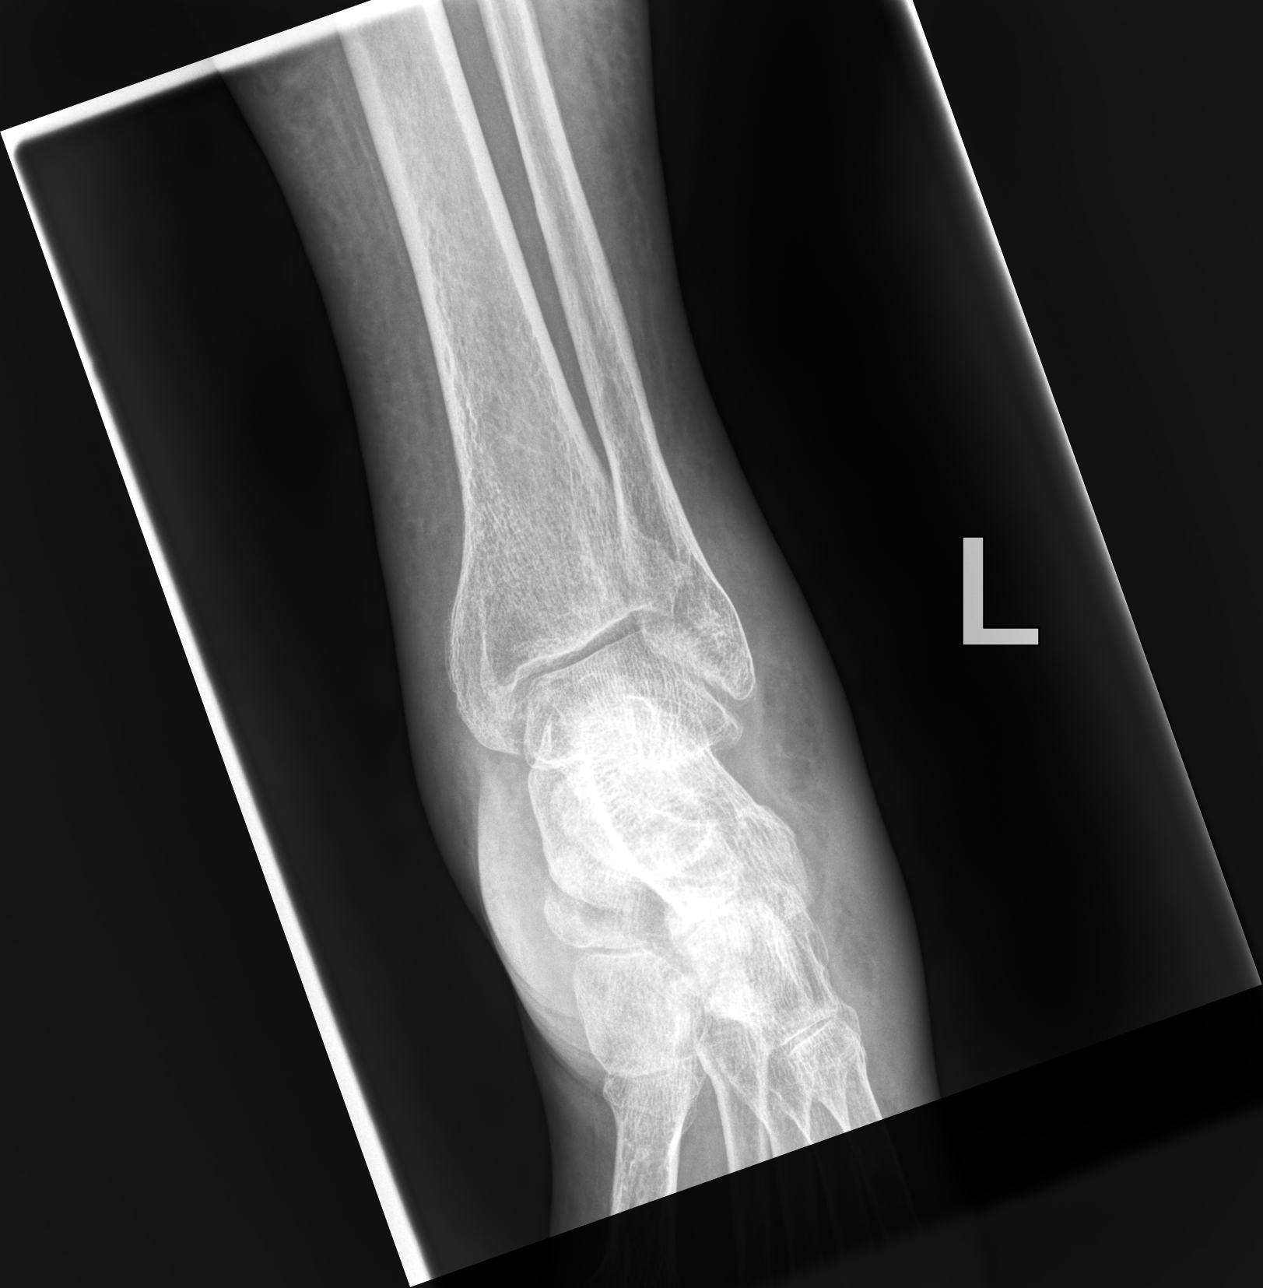

[ankle mlo]
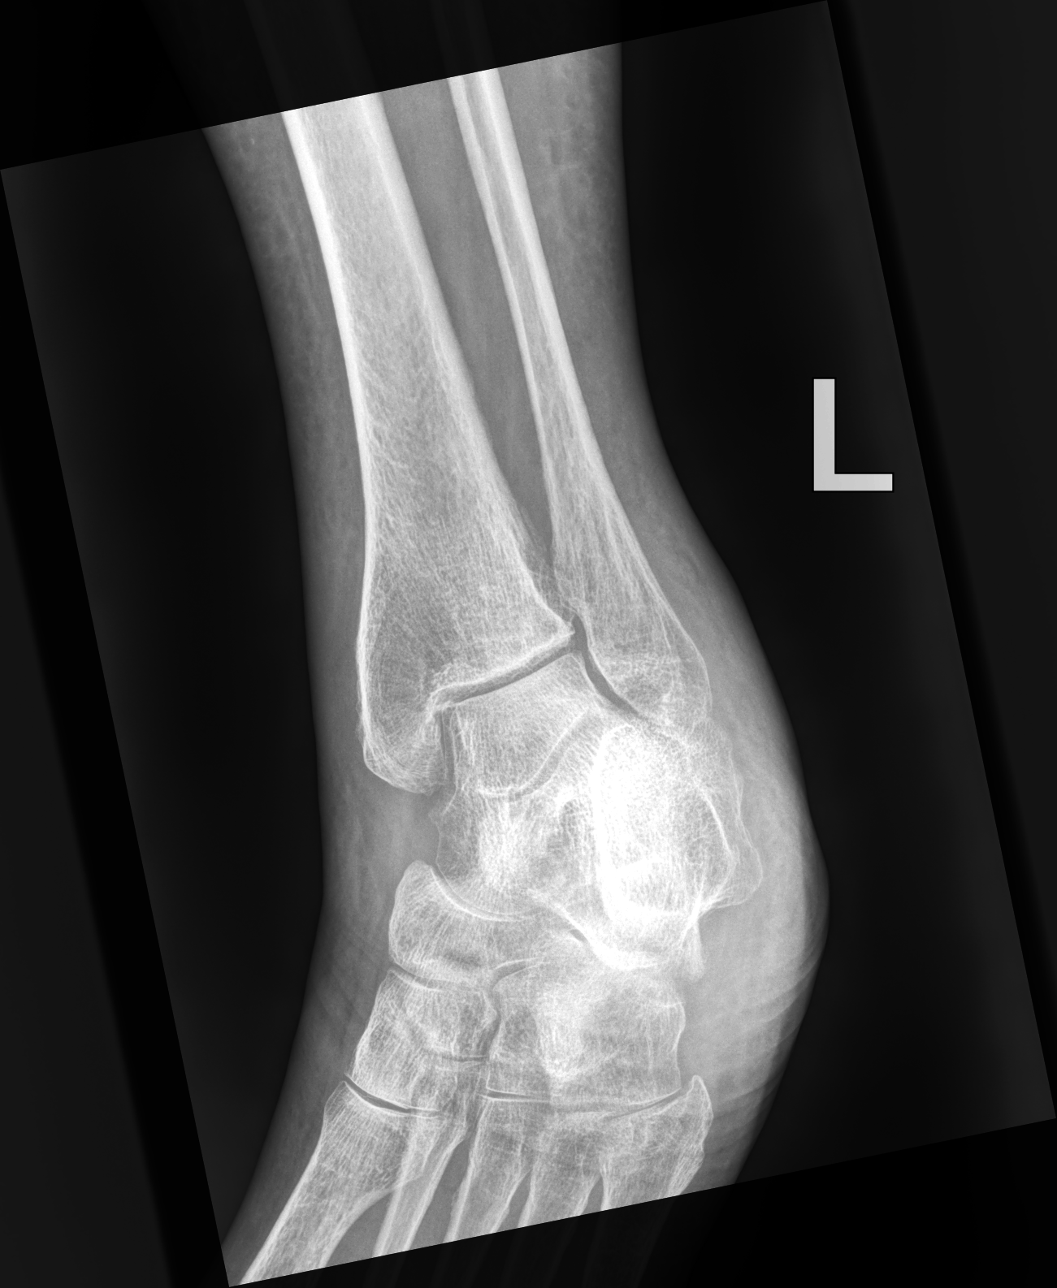

[ankle lat]
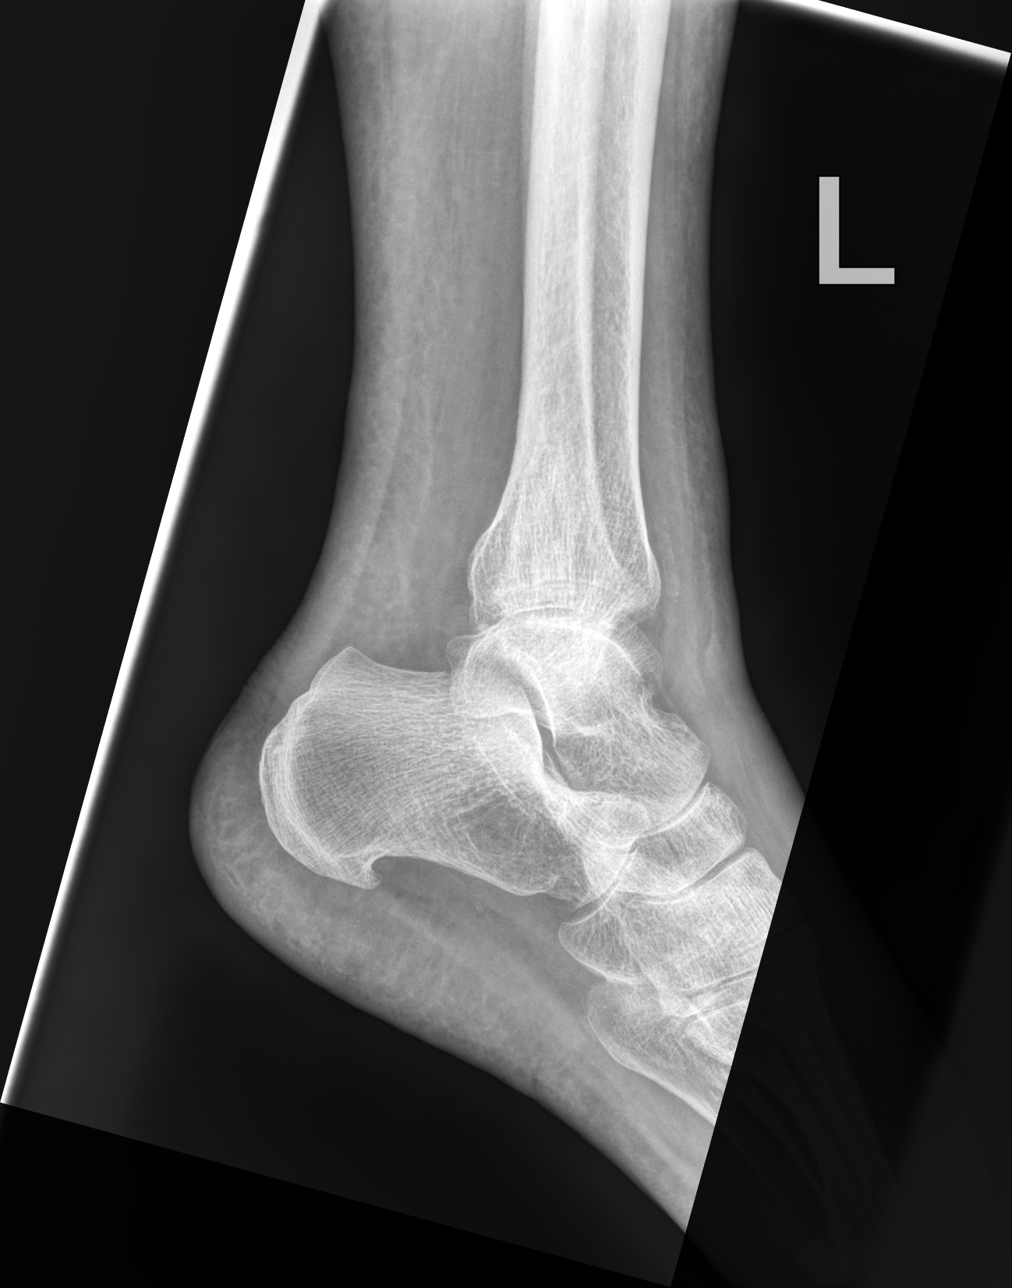

[3 of 3 positions shown; findings below may reference images not displayed]

FINDINGS: Diffuse soft tissue swelling. Bones appear osteopenic. No definitive
fracture or malalignment
IMPRESSION: Soft tissue swelling. No definite acute osseous abnormality.

## 2021-01-20 MED ORDER — AMOXICILLIN-POT CLAVULANATE 875-125 MG PO TABS: 1.0000 | ORAL_TABLET | Freq: Two times a day (BID) | ORAL | 0 refills | Status: AC

## 2021-01-20 MED ORDER — CEFTRIAXONE SODIUM 1 G IJ SOLR
1.0000 g | Freq: Once | INTRAMUSCULAR | Status: AC
  Administered 2021-01-20: 1 g via INTRAMUSCULAR

## 2021-01-20 NOTE — ED Triage Notes (Signed)
Left foot swollen and painful. History of multiple falls.

## 2021-01-20 NOTE — ED Provider Notes (Addendum)
[place]   [id number] 01/20/21 Arrival Time: 1842  CC:LT foot PAIN  SUBJECTIVE: History from: patient and family. Claudia Keller is a 70 y.o. female complains of LT foot pain that began "months ago."  Denies a precipitating event or specific injury.  States she has had several falls over the past few weeks and cat has scratched her foot.  Localizes the pain to the heel.  Describes the pain as intermittent and sharp in character.  Denies alleviating factors.  Symptoms are made worse with weight-bearing.  Complains of redness, swelling.  Denies fever, chills, nausea, vomiting, ecchymosis.  Patient and family are a poor historian.  Difficulty with recalling when symptoms began and if there was a MOI.    ROS: As per HPI.  All other pertinent ROS negative.     Past Medical History:  Diagnosis Date  . Arthritis   . COPD (chronic obstructive pulmonary disease) ([place])   . Diverticulosis   . Essential hypertension, benign   . Hx of colonic polyps   . Hypothyroidism   . IBS (irritable bowel syndrome)   . Irritable bowel syndrome   . Ketoacidosis   . Mixed hyperlipidemia   . PTSD (post-traumatic stress disorder)   . S/P colonoscopy Jan 2011   RMR: pancolonic diverticula, polypoid rectal mucosa with  prominent lymphoid aggregates, repeat in Jan 2016   . Shortness of breath dyspnea   . Stress incontinence, female   . Thyroid disease   . Tobacco use   . Type 2 diabetes mellitus ([place])    Past Surgical History:  Procedure Laterality Date  . Bilateral tubal ligation    . BREAST BIOPSY Left 10/16/2014   negative  . CHOLECYSTECTOMY  09/11/2012   Procedure: LAPAROSCOPIC CHOLECYSTECTOMY;  Surgeon: Jamesetta So, MD;  Location: [facility];  Service: General;  Laterality: N/A;  . COLONOSCOPY  11/17/2009   Dr. Gala Romney: normal rectum, pancolonic diverticula, polyps benign, no adenomas.   . COLONOSCOPY N/A 02/12/2015   Dr. Gala Romney: colonic diverticulosis, multiple colonic polyps (tubular  adenomas), negative segmental biopsies. Surveillance in 2021  . ESOPHAGOGASTRODUODENOSCOPY  11/23/2011   Dr. Gala Romney: Erosive reflux esophagitis; small hiatal hernia; esophagus dilated empirically  . MALONEY DILATION  11/23/2011   Procedure: Venia Minks DILATION;  Surgeon: Daneil Dolin, MD;  Location: [facility];  Service: Endoscopy;  Laterality: N/A;  . SKIN LESION EXCISION     on buttocks  . TUBAL LIGATION     No Active Allergies No current facility-administered medications on file prior to encounter.   Current Outpatient Medications on File Prior to Encounter  Medication Sig Dispense Refill  . albuterol (VENTOLIN HFA) 108 (90 Base) MCG/ACT inhaler Inhale 2 puffs into the lungs every 4 (four) hours as needed for wheezing or shortness of breath. 8 g 1  . atorvastatin (LIPITOR) 20 MG tablet Take 1 tablet (20 mg total) by mouth at bedtime.    . Calcium Carb-Cholecalciferol (OYSTER SHELL CALCIUM W/D) 500-200 MG-UNIT TABS Take 1 tablet by mouth 2 (two) times daily.    . cetirizine (ZYRTEC) 10 MG tablet Take 10 mg by mouth daily.    . colestipol (COLESTID) 1 g tablet TAKE 2 TABLETS DAILY FOR DIARRHEA. DO NOT TAKE WITHIN 2 HOURS OF OTHER ORAL MEDICATIONS. HOLD FOR CONSTIPATION. 60 tablet 11  . diltiazem (CARDIZEM CD) 120 MG 24 hr capsule Take 1 capsule (120 mg total) by mouth daily. 30 capsule 11  . divalproex (DEPAKOTE ER) 500 MG 24 hr tablet Take 2  tablets (1,000 mg total) by mouth every evening. 180 tablet 0  . divalproex (DEPAKOTE) 250 MG DR tablet Take 1 tablet (250 mg total) by mouth daily. 90 tablet 0  . furosemide (LASIX) 20 MG tablet Take 20 mg by mouth daily.    Marland Kitchen gabapentin (NEURONTIN) 100 MG capsule Take 100 mg by mouth 3 (three) times daily.    Marland Kitchen guaiFENesin-dextromethorphan (ROBITUSSIN DM) 100-10 MG/5ML syrup Take 10 mLs by mouth every 4 (four) hours as needed for cough. 118 mL 0  . insulin detemir (LEVEMIR) 100 UNIT/ML injection Inject 0.1 mLs (10 Units total) into the skin 2 (two)  times daily. 10 mL 11  . insulin lispro (HUMALOG) 100 UNIT/ML injection Inject 15-24 Units into the skin 3 (three) times daily with meals. Per sliding scale. 151-200=18 units; 201-250= 9 units; 251-300=20 units; 301-350=21 units; 351-400= 22 units; 401-450=23 units; 500-551=24 units.    Marland Kitchen levothyroxine (SYNTHROID) 100 MCG tablet Take 2 tablets (200 mcg total) by mouth daily before breakfast.    . linagliptin (TRADJENTA) 5 MG TABS tablet Take 5 mg by mouth daily.     Marland Kitchen lisinopril (PRINIVIL,ZESTRIL) 20 MG tablet Take 20 mg by mouth daily.    . metFORMIN (GLUCOPHAGE) 500 MG tablet Take 500 mg by mouth 2 (two) times daily.    . mirtazapine (REMERON) 7.5 MG tablet Take 1 tablet (7.5 mg total) by mouth at bedtime. 90 tablet 0  . nicotine polacrilex (NICORETTE) 4 MG gum Take 1 each (4 mg total) by mouth as needed for smoking cessation. 100 tablet 0  . NOVOLOG FLEXPEN 100 UNIT/ML FlexPen Inject 5 Units into the skin in the morning, at noon, and at bedtime. 15 mL 11  . ondansetron (ZOFRAN) 4 MG tablet Take 1 tablet (4 mg total) by mouth every 8 (eight) hours as needed for nausea or vomiting. 30 tablet 2  . pantoprazole (PROTONIX) 40 MG tablet TAKE 1 TABLET BY MOUTH ONCE DAILY 30 MINTUES BEFORE BREAKFAST. (Patient taking differently: Take 40 mg by mouth daily before breakfast.) 30 tablet 11  . PARoxetine (PAXIL) 40 MG tablet Take 1 tablet (40 mg total) by mouth daily. 90 tablet 0  . QUEtiapine (SEROQUEL) 100 MG tablet Take 1 tablet (100 mg total) by mouth at bedtime. 90 tablet 0   Social History   Socioeconomic History  . Marital status: Legally Separated    Spouse name: Not on file  . Number of children: Not on file  . Years of education: Not on file  . Highest education level: Not on file  Occupational History  . Not on file  Tobacco Use  . Smoking status: Current Every Day Smoker    Packs/day: 2.00    Years: 43.00    Pack years: 86.00    Types: Cigarettes  . Smokeless tobacco: Never Used   Substance and Sexual Activity  . Alcohol use: No    Alcohol/week: 0.0 standard drinks  . Drug use: No  . Sexual activity: Not Currently    Birth control/protection: Surgical, Post-menopausal    Comment: tubal  Other Topics Concern  . Not on file  Social History Narrative  . Not on file   Social Determinants of Health   Financial Resource Strain: Not on file  Food Insecurity: No Food Insecurity  . Worried About Charity fundraiser in the Last Year: Never true  . Ran Out of Food in the Last Year: Never true  Transportation Needs: No Transportation Needs  . Lack of Transportation (Medical):  No  . Lack of Transportation (Non-Medical): No  Physical Activity: Not on file  Stress: Not on file  Social Connections: Not on file  Intimate Partner Violence: Not on file   Family History  Problem Relation Age of Onset  . Schizophrenia Brother   . Stroke Brother   . Cancer Mother        unsure what type  . Heart disease Father   . Heart attack Father   . Coronary artery disease Other   . Diabetes type II Other   . Cancer Son        bladder  . Colon cancer Neg Hx     OBJECTIVE:  Vitals:   01/20/21 1849  BP: (!) 141/71  Pulse: (!) 109  Resp: 18  Temp: 98.1 F (36.7 C)  TempSrc: Temporal  SpO2: 94%    General appearance: ALERT; in no acute distress.  Head: NCAT Lungs: Normal respiratory effort CV: Dorsalis pedis pulse 2+ Musculoskeletal: LT foot Inspection: Swelling and mild overlying erythema diffuse about the foot; two superficial scratches to medial foot/ ankle Palpation: TTP over heel ROM: deferred Strength: deferred Skin: warm and dry Neurologic: Sitting in wheelchair Psychological: alert and cooperative; anxious mood and affect  DIAGNOSTIC STUDIES:  DG Ankle Complete Left  Result Date: 01/20/2021 CLINICAL DATA:  Swelling and pain multiple fall EXAM: LEFT ANKLE COMPLETE - 3+ VIEW COMPARISON:  12/07/2020 FINDINGS: Diffuse soft tissue swelling. Bones appear  osteopenic. No definitive fracture or malalignment IMPRESSION: Soft tissue swelling. No definite acute osseous abnormality. Electronically Signed   By: Donavan Foil M.D.   On: 01/20/2021 19:14    X-rays negative for bony abnormalities including fracture, or dislocation.   I have reviewed the x-rays myself and the radiologist interpretation. I am in agreement with the radiologist interpretation.     ASSESSMENT & PLAN:  1. Left foot pain   2. Animal scratch     @NIMG @  No orders of the defined types were placed in this encounter.  X-rays negative for fracture or dislocation Given hx of cat scratches and exam today will cover for infection 1 gram rocephin given in office Augmentin prescribed.  Take as directed and to completion.   If you do not have improvement in pain, swelling and/or redness over the next 12-24 hours go to the ED for further work-up  Otherwise: Continue conservative management of rest, ice, and gentle stretches Continue with OTC medications to alleviate pain  Follow up with PCP for recheck this week to ensure symptoms are improving Return or go to the ER if you have any new or worsening symptoms (fever, chills, chest pain, redness, swelling, drainage, cold limb, etc...)   Reviewed expectations re: course of current medical issues. Questions answered. Outlined signs and symptoms indicating need for more acute intervention. Patient verbalized understanding. After Visit Summary given.    Lestine Box, PA-C 01/20/21 [place], [place], PA-C 01/20/21 1920

## 2021-01-20 NOTE — Discharge Instructions (Addendum)
X-rays negative for fracture or dislocation Given hx of cat scratches and exam today will cover for infection 1 gram rocephin given in office Augmentin prescribed.  Take as directed and to completion.   If you do not have improvement in pain, swelling and/or redness over the next 12-24 hours go to the ED for further work-up  Otherwise: Continue conservative management of rest, ice, and gentle stretches Continue with OTC medications to alleviate pain  Follow up with PCP this week to ensure symptoms are improving (written on AVS) Return or go to the ER if you have any new or worsening symptoms (fever, chills, chest pain, redness, swelling, drainage, cold limb, etc...)

## 2021-01-21 DIAGNOSIS — S92502D Displaced unspecified fracture of left lesser toe(s), subsequent encounter for fracture with routine healing: Secondary | ICD-10-CM | POA: Diagnosis not present

## 2021-01-21 DIAGNOSIS — J1282 Pneumonia due to coronavirus disease 2019: Secondary | ICD-10-CM | POA: Diagnosis not present

## 2021-01-21 DIAGNOSIS — R69 Illness, unspecified: Secondary | ICD-10-CM | POA: Diagnosis not present

## 2021-01-21 DIAGNOSIS — E1140 Type 2 diabetes mellitus with diabetic neuropathy, unspecified: Secondary | ICD-10-CM | POA: Diagnosis not present

## 2021-01-21 DIAGNOSIS — S92522D Displaced fracture of medial phalanx of left lesser toe(s), subsequent encounter for fracture with routine healing: Secondary | ICD-10-CM | POA: Diagnosis not present

## 2021-01-21 DIAGNOSIS — U071 COVID-19: Secondary | ICD-10-CM | POA: Diagnosis not present

## 2021-01-21 DIAGNOSIS — J440 Chronic obstructive pulmonary disease with acute lower respiratory infection: Secondary | ICD-10-CM | POA: Diagnosis not present

## 2021-01-21 DIAGNOSIS — I10 Essential (primary) hypertension: Secondary | ICD-10-CM | POA: Diagnosis not present

## 2021-01-21 DIAGNOSIS — N390 Urinary tract infection, site not specified: Secondary | ICD-10-CM | POA: Diagnosis not present

## 2021-01-26 ENCOUNTER — Telehealth: Payer: Medicare HMO | Admitting: Psychiatry

## 2021-01-26 ENCOUNTER — Other Ambulatory Visit: Payer: Self-pay

## 2021-01-26 ENCOUNTER — Telehealth: Payer: Self-pay | Admitting: Psychiatry

## 2021-01-26 DIAGNOSIS — J440 Chronic obstructive pulmonary disease with acute lower respiratory infection: Secondary | ICD-10-CM | POA: Diagnosis not present

## 2021-01-26 DIAGNOSIS — S92502D Displaced unspecified fracture of left lesser toe(s), subsequent encounter for fracture with routine healing: Secondary | ICD-10-CM | POA: Diagnosis not present

## 2021-01-26 DIAGNOSIS — U071 COVID-19: Secondary | ICD-10-CM | POA: Diagnosis not present

## 2021-01-26 DIAGNOSIS — E1140 Type 2 diabetes mellitus with diabetic neuropathy, unspecified: Secondary | ICD-10-CM | POA: Diagnosis not present

## 2021-01-26 DIAGNOSIS — R69 Illness, unspecified: Secondary | ICD-10-CM | POA: Diagnosis not present

## 2021-01-26 DIAGNOSIS — I10 Essential (primary) hypertension: Secondary | ICD-10-CM | POA: Diagnosis not present

## 2021-01-26 DIAGNOSIS — N390 Urinary tract infection, site not specified: Secondary | ICD-10-CM | POA: Diagnosis not present

## 2021-01-26 DIAGNOSIS — J1282 Pneumonia due to coronavirus disease 2019: Secondary | ICD-10-CM | POA: Diagnosis not present

## 2021-01-26 DIAGNOSIS — S92522D Displaced fracture of medial phalanx of left lesser toe(s), subsequent encounter for fracture with routine healing: Secondary | ICD-10-CM | POA: Diagnosis not present

## 2021-01-26 NOTE — Telephone Encounter (Signed)
Called the patient for appointment scheduled today. The patient did not answer the phone. Left voice message to contact the office (also contacted the cell phone- although her son answered, he was not with the patient).

## 2021-02-02 DIAGNOSIS — J1282 Pneumonia due to coronavirus disease 2019: Secondary | ICD-10-CM | POA: Diagnosis not present

## 2021-02-02 DIAGNOSIS — S92502D Displaced unspecified fracture of left lesser toe(s), subsequent encounter for fracture with routine healing: Secondary | ICD-10-CM | POA: Diagnosis not present

## 2021-02-02 DIAGNOSIS — N390 Urinary tract infection, site not specified: Secondary | ICD-10-CM | POA: Diagnosis not present

## 2021-02-02 DIAGNOSIS — U071 COVID-19: Secondary | ICD-10-CM | POA: Diagnosis not present

## 2021-02-02 DIAGNOSIS — J440 Chronic obstructive pulmonary disease with acute lower respiratory infection: Secondary | ICD-10-CM | POA: Diagnosis not present

## 2021-02-02 DIAGNOSIS — I10 Essential (primary) hypertension: Secondary | ICD-10-CM | POA: Diagnosis not present

## 2021-02-02 DIAGNOSIS — R69 Illness, unspecified: Secondary | ICD-10-CM | POA: Diagnosis not present

## 2021-02-02 DIAGNOSIS — S92522D Displaced fracture of medial phalanx of left lesser toe(s), subsequent encounter for fracture with routine healing: Secondary | ICD-10-CM | POA: Diagnosis not present

## 2021-02-02 DIAGNOSIS — E1140 Type 2 diabetes mellitus with diabetic neuropathy, unspecified: Secondary | ICD-10-CM | POA: Diagnosis not present

## 2021-02-03 ENCOUNTER — Other Ambulatory Visit: Payer: Self-pay

## 2021-02-03 ENCOUNTER — Encounter: Payer: Self-pay | Admitting: Orthopaedic Surgery

## 2021-02-03 ENCOUNTER — Telehealth: Payer: Self-pay | Admitting: Radiology

## 2021-02-03 ENCOUNTER — Ambulatory Visit: Payer: Medicare HMO

## 2021-02-03 ENCOUNTER — Ambulatory Visit (INDEPENDENT_AMBULATORY_CARE_PROVIDER_SITE_OTHER): Payer: Medicare HMO | Admitting: Orthopaedic Surgery

## 2021-02-03 ENCOUNTER — Telehealth: Payer: Self-pay | Admitting: Orthopaedic Surgery

## 2021-02-03 ENCOUNTER — Ambulatory Visit (HOSPITAL_COMMUNITY)
Admission: RE | Admit: 2021-02-03 | Discharge: 2021-02-03 | Disposition: A | Discharge status: Home / Self Care | Payer: Medicare HMO | Source: Ambulatory Visit | Attending: Orthopaedic Surgery | Admitting: Orthopaedic Surgery

## 2021-02-03 VITALS — BP 159/68 | HR 76 | Ht 64.0 in | Wt 170.2 lb

## 2021-02-03 DIAGNOSIS — S92322A Displaced fracture of second metatarsal bone, left foot, initial encounter for closed fracture: Secondary | ICD-10-CM

## 2021-02-03 DIAGNOSIS — M7989 Other specified soft tissue disorders: Secondary | ICD-10-CM | POA: Diagnosis not present

## 2021-02-03 DIAGNOSIS — E0841 Diabetes mellitus due to underlying condition with diabetic mononeuropathy: Secondary | ICD-10-CM

## 2021-02-03 DIAGNOSIS — S92912A Unspecified fracture of left toe(s), initial encounter for closed fracture: Secondary | ICD-10-CM

## 2021-02-03 DIAGNOSIS — L03116 Cellulitis of left lower limb: Secondary | ICD-10-CM | POA: Diagnosis not present

## 2021-02-03 DIAGNOSIS — R600 Localized edema: Secondary | ICD-10-CM

## 2021-02-03 DIAGNOSIS — M79672 Pain in left foot: Secondary | ICD-10-CM | POA: Diagnosis not present

## 2021-02-03 IMAGING — US US EXTREM LOW VENOUS*L*
1 series · 14 of 24 positions shown · non-contrast
Comparison: None.

CLINICAL DATA: Left foot pain and swelling

EXAM:
LEFT LOWER EXTREMITY VENOUS DOPPLER ULTRASOUND
TECHNIQUE: Gray-scale sonography with compression, as well as color and duplex
ultrasound, were performed to evaluate the deep venous system(s)
from the level of the common femoral vein through the popliteal and
proximal calf veins.

[Series 1: us venous img lower uni left (dvt) · portal-venous · 14 of 37 slices shown]
[im 1/37]
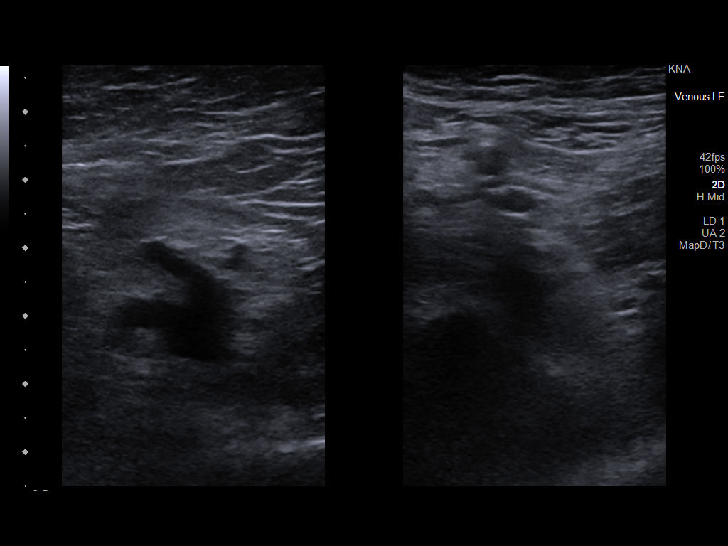
[im 4/37]
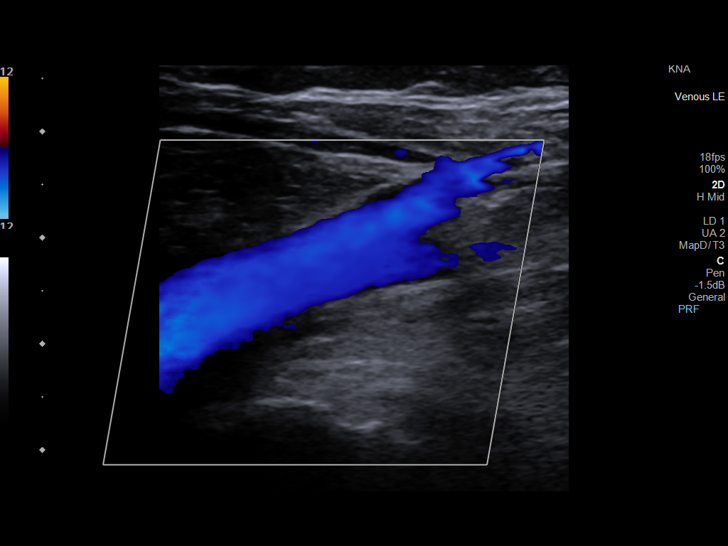
[im 7/37]
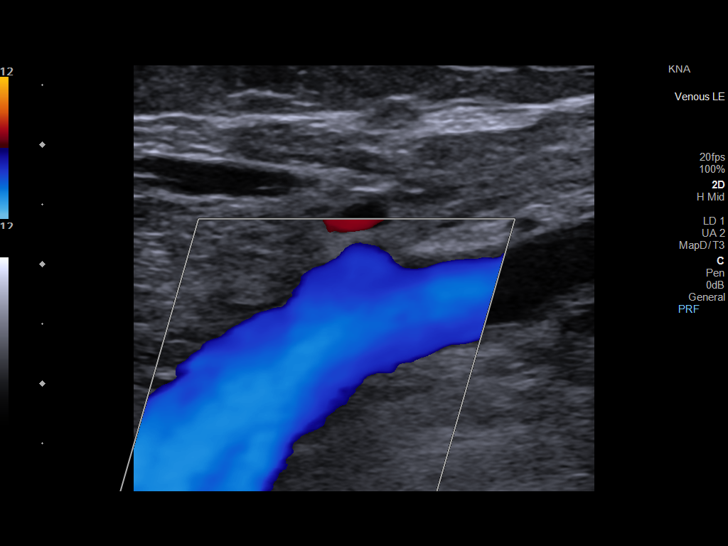
[im 10/37]
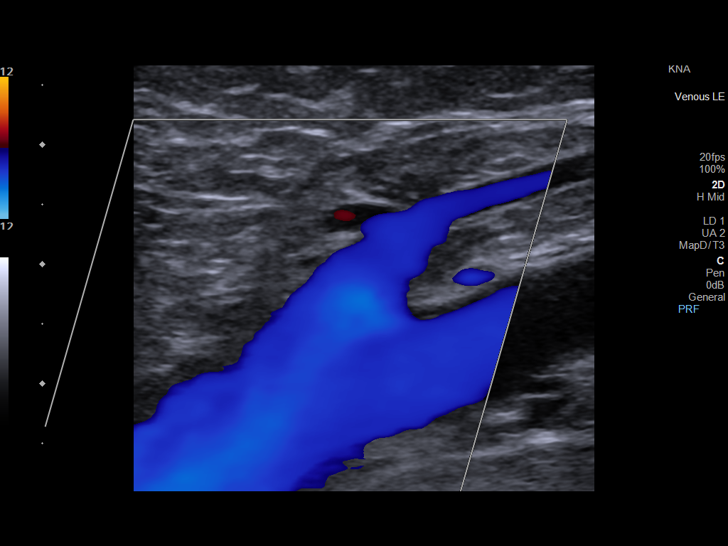
[im 11/37]
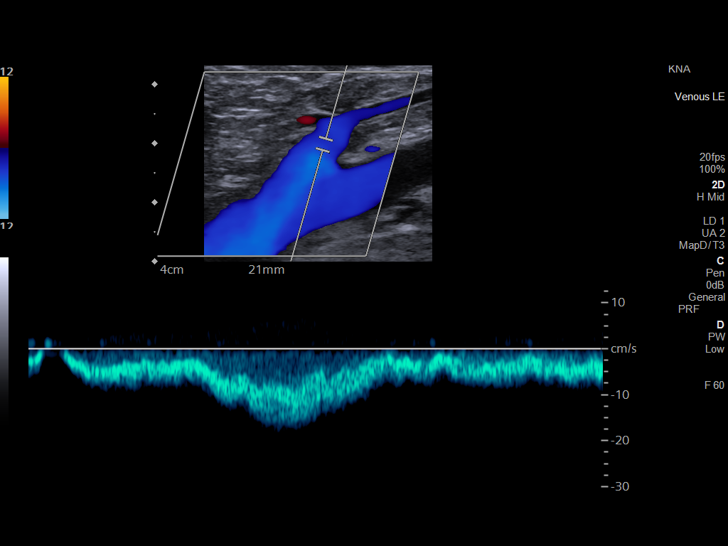
[im 15/37]
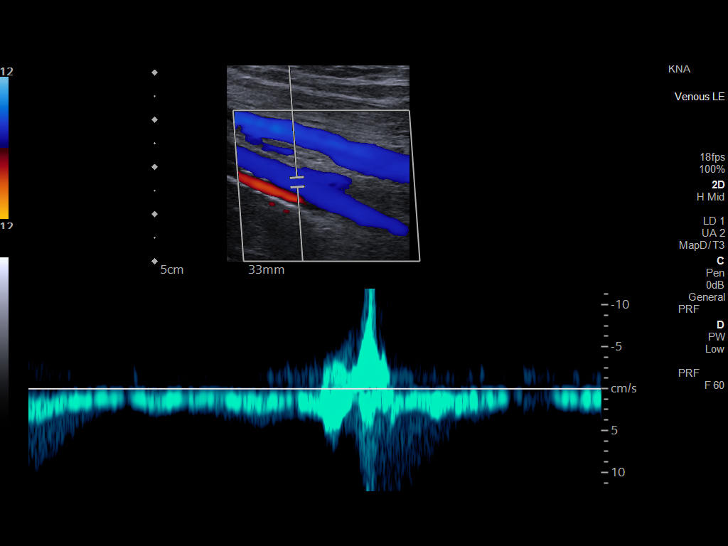
[im 18/37]
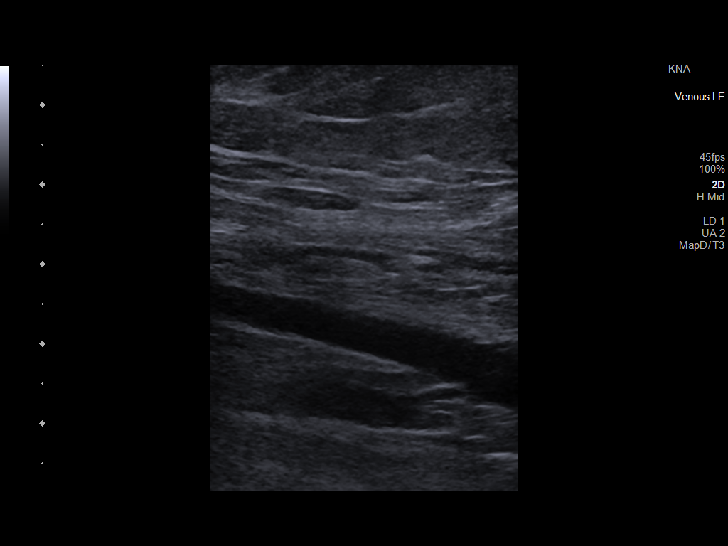
[im 19/37]
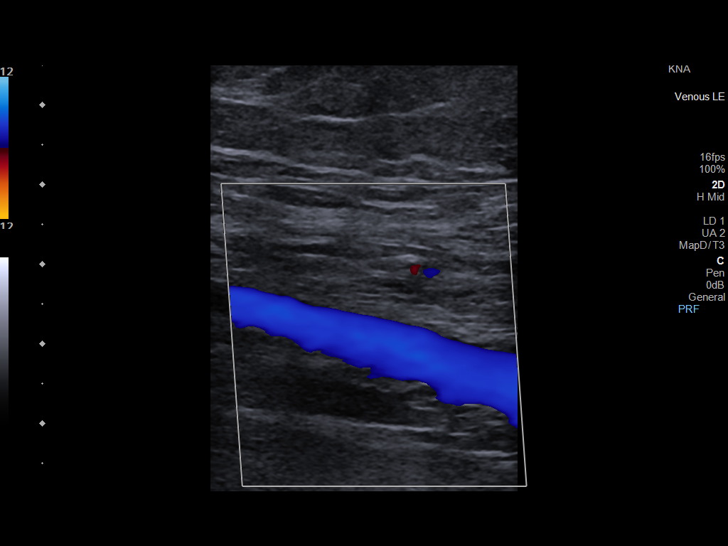
[im 22/37]
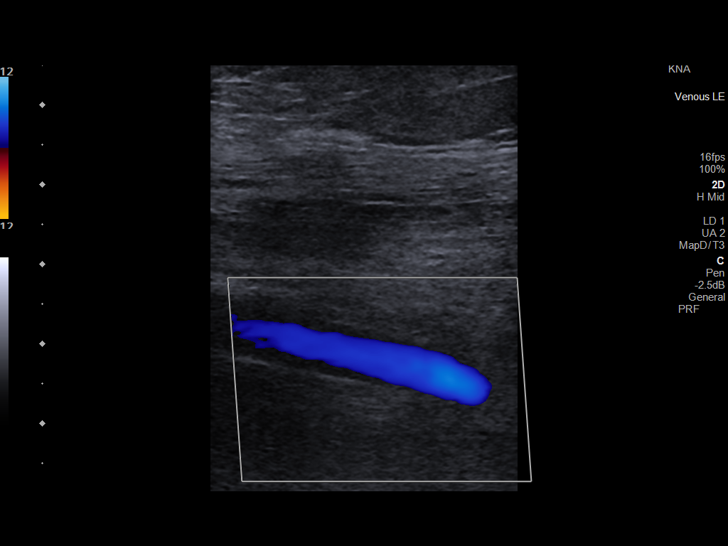
[im 26/37]
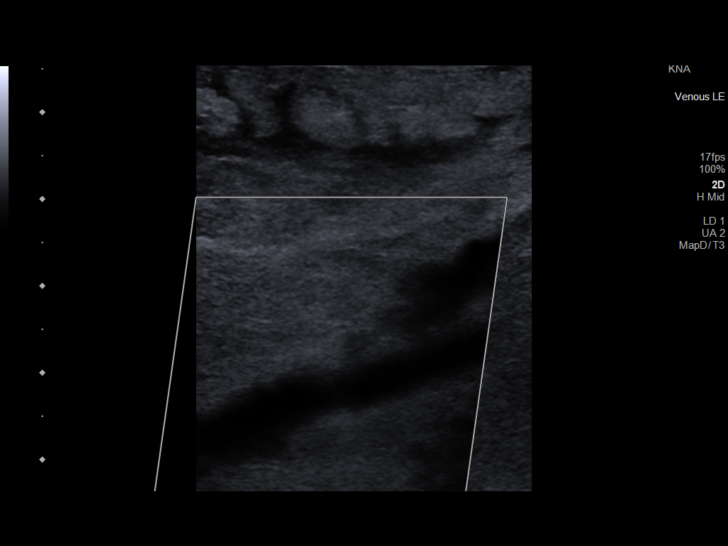
[im 29/37]
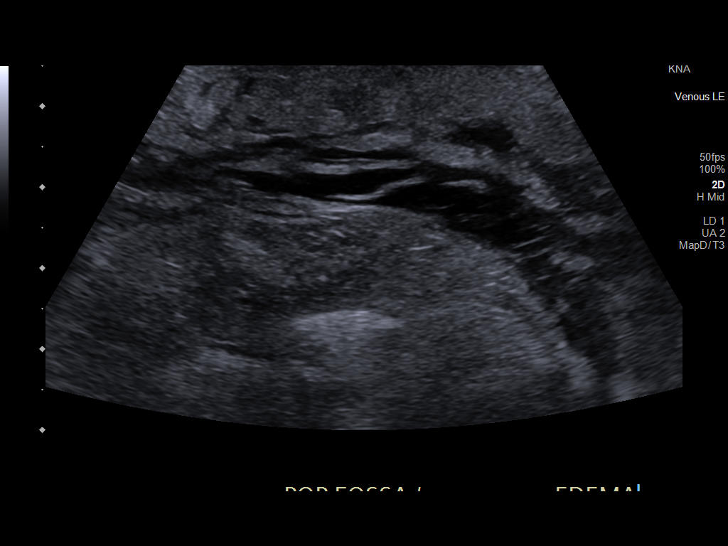
[im 30/37]
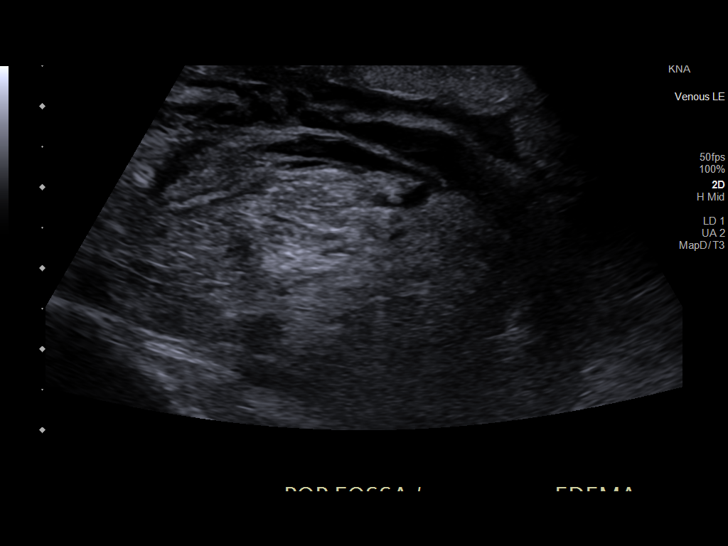
[im 33/37]
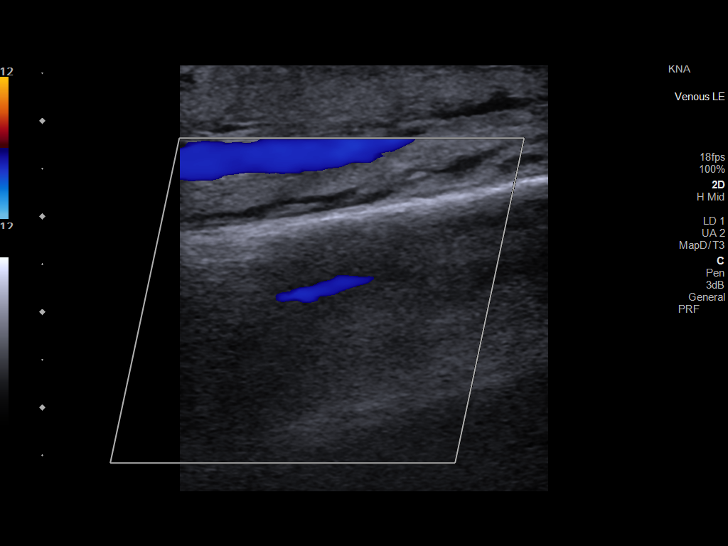
[im 37/37]
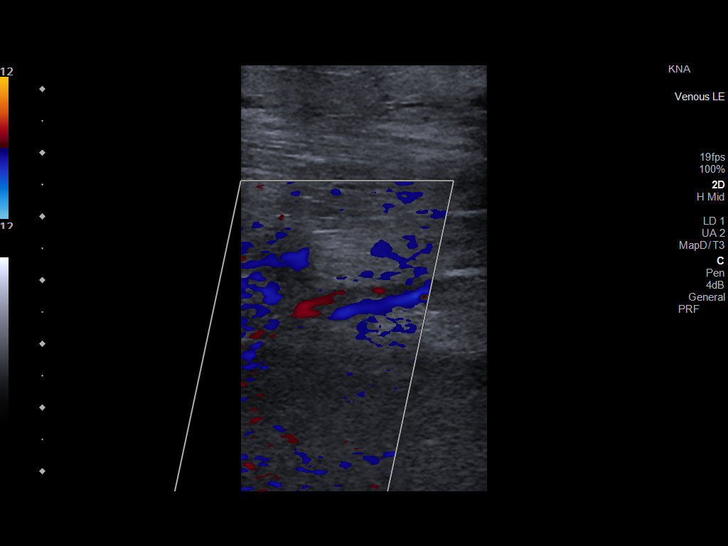

[14 of 24 positions shown; findings below may reference images not displayed]

FINDINGS: VENOUS

Normal compressibility of the common femoral, superficial femoral,
and popliteal veins, as well as the visualized calf veins.
Visualized portions of profunda femoral vein and great saphenous
vein unremarkable. No filling defects to suggest DVT on grayscale or
color Doppler imaging. Doppler waveforms show normal direction of
venous flow, normal respiratory plasticity and response to
augmentation.

Limited views of the contralateral common femoral vein are
unremarkable.

OTHER

Superficial subcutaneous edema noted.

Limitations: none
IMPRESSION: Negative.

## 2021-02-03 MED ORDER — DOXYCYCLINE HYCLATE 100 MG PO CAPS: 100.0000 mg | ORAL_CAPSULE | Freq: Every day | ORAL | 0 refills | Status: DC

## 2021-02-03 NOTE — Progress Notes (Signed)
Subjective:    Patient ID: Claudia Keller, female    DOB: [birth year], 70 y.o.   MRN: [id number]  HPI She broke the toes 2 and 3 proximal phalanges in late January.  A week later she had X-rays done which showed: IMPRESSION: Fractures of the second and third proximal phalanges.  They were nondisplaced.  She continued to have pain and went to Urgent Care 01-20-21.  She is here now complaining of swelling of the left toes and of the left foot.  She has not had any new trauma.  She has soaked the toes in hot water, betadine and used ointment.  She has no new trauma.   Review of Systems  Constitutional: Positive for activity change.  Musculoskeletal: Positive for arthralgias, gait problem and joint swelling.  All other systems reviewed and are negative.  For Review of Systems, all other systems reviewed and are negative.  The following is a summary of the past history medically, past history surgically, known current medicines, social history and family history.  This information is gathered electronically by the computer from prior information and documentation.  I review this each visit and have found including this information at this point in the chart is beneficial and informative.   Past Medical History:  Diagnosis Date  . Arthritis   . COPD (chronic obstructive pulmonary disease) ([place])   . Diverticulosis   . Essential hypertension, benign   . Hx of colonic polyps   . Hypothyroidism   . IBS (irritable bowel syndrome)   . Irritable bowel syndrome   . Ketoacidosis   . Mixed hyperlipidemia   . PTSD (post-traumatic stress disorder)   . S/P colonoscopy Jan 2011   RMR: pancolonic diverticula, polypoid rectal mucosa with  prominent lymphoid aggregates, repeat in Jan 2016   . Shortness of breath dyspnea   . Stress incontinence, female   . Thyroid disease   . Tobacco use   . Type 2 diabetes mellitus ([place])     Past Surgical History:  Procedure Laterality Date  . Bilateral  tubal ligation    . BREAST BIOPSY Left 10/16/2014   negative  . CHOLECYSTECTOMY  09/11/2012   Procedure: LAPAROSCOPIC CHOLECYSTECTOMY;  Surgeon: Jamesetta So, MD;  Location: [facility];  Service: General;  Laterality: N/A;  . COLONOSCOPY  11/17/2009   Dr. Gala Romney: normal rectum, pancolonic diverticula, polyps benign, no adenomas.   . COLONOSCOPY N/A 02/12/2015   Dr. Gala Romney: colonic diverticulosis, multiple colonic polyps (tubular adenomas), negative segmental biopsies. Surveillance in 2021  . ESOPHAGOGASTRODUODENOSCOPY  11/23/2011   Dr. Gala Romney: Erosive reflux esophagitis; small hiatal hernia; esophagus dilated empirically  . MALONEY DILATION  11/23/2011   Procedure: Venia Minks DILATION;  Surgeon: Daneil Dolin, MD;  Location: [facility] SUITE;  Service: Endoscopy;  Laterality: N/A;  . SKIN LESION EXCISION     on buttocks  . TUBAL LIGATION      Current Outpatient Medications on File Prior to Visit  Medication Sig Dispense Refill  . albuterol (VENTOLIN HFA) 108 (90 Base) MCG/ACT inhaler Inhale 2 puffs into the lungs every 4 (four) hours as needed for wheezing or shortness of breath. 8 g 1  . atorvastatin (LIPITOR) 20 MG tablet Take 1 tablet (20 mg total) by mouth at bedtime.    . Calcium Carb-Cholecalciferol (OYSTER SHELL CALCIUM W/D) 500-200 MG-UNIT TABS Take 1 tablet by mouth 2 (two) times daily.    . cetirizine (ZYRTEC) 10 MG tablet Take 10 mg by mouth daily.    Marland Kitchen  colestipol (COLESTID) 1 g tablet TAKE 2 TABLETS DAILY FOR DIARRHEA. DO NOT TAKE WITHIN 2 HOURS OF OTHER ORAL MEDICATIONS. HOLD FOR CONSTIPATION. 60 tablet 11  . diltiazem (CARDIZEM CD) 120 MG 24 hr capsule Take 1 capsule (120 mg total) by mouth daily. 30 capsule 11  . divalproex (DEPAKOTE ER) 500 MG 24 hr tablet Take 2 tablets (1,000 mg total) by mouth every evening. 180 tablet 0  . divalproex (DEPAKOTE) 250 MG DR tablet Take 1 tablet (250 mg total) by mouth daily. 90 tablet 0  . furosemide (LASIX) 20 MG tablet Take 20 mg by mouth daily.    Marland Kitchen  gabapentin (NEURONTIN) 100 MG capsule Take 100 mg by mouth 3 (three) times daily.    Marland Kitchen guaiFENesin-dextromethorphan (ROBITUSSIN DM) 100-10 MG/5ML syrup Take 10 mLs by mouth every 4 (four) hours as needed for cough. 118 mL 0  . insulin detemir (LEVEMIR) 100 UNIT/ML injection Inject 0.1 mLs (10 Units total) into the skin 2 (two) times daily. 10 mL 11  . insulin lispro (HUMALOG) 100 UNIT/ML injection Inject 15-24 Units into the skin 3 (three) times daily with meals. Per sliding scale. 151-200=18 units; 201-250= 9 units; 251-300=20 units; 301-350=21 units; 351-400= 22 units; 401-450=23 units; 500-551=24 units.    Marland Kitchen levothyroxine (SYNTHROID) 100 MCG tablet Take 2 tablets (200 mcg total) by mouth daily before breakfast.    . linagliptin (TRADJENTA) 5 MG TABS tablet Take 5 mg by mouth daily.     Marland Kitchen lisinopril (PRINIVIL,ZESTRIL) 20 MG tablet Take 20 mg by mouth daily.    . metFORMIN (GLUCOPHAGE) 500 MG tablet Take 500 mg by mouth 2 (two) times daily.    . mirtazapine (REMERON) 7.5 MG tablet Take 1 tablet (7.5 mg total) by mouth at bedtime. 90 tablet 0  . nicotine polacrilex (NICORETTE) 4 MG gum Take 1 each (4 mg total) by mouth as needed for smoking cessation. 100 tablet 0  . NOVOLOG FLEXPEN 100 UNIT/ML FlexPen Inject 5 Units into the skin in the morning, at noon, and at bedtime. 15 mL 11  . ondansetron (ZOFRAN) 4 MG tablet Take 1 tablet (4 mg total) by mouth every 8 (eight) hours as needed for nausea or vomiting. 30 tablet 2  . pantoprazole (PROTONIX) 40 MG tablet TAKE 1 TABLET BY MOUTH ONCE DAILY 30 MINTUES BEFORE BREAKFAST. (Patient taking differently: Take 40 mg by mouth daily before breakfast.) 30 tablet 11  . PARoxetine (PAXIL) 40 MG tablet Take 1 tablet (40 mg total) by mouth daily. 90 tablet 0  . QUEtiapine (SEROQUEL) 100 MG tablet Take 1 tablet (100 mg total) by mouth at bedtime. 90 tablet 0   No current facility-administered medications on file prior to visit.    Social History   Socioeconomic  History  . Marital status: Legally Separated    Spouse name: Not on file  . Number of children: Not on file  . Years of education: Not on file  . Highest education level: Not on file  Occupational History  . Not on file  Tobacco Use  . Smoking status: Current Every Day Smoker    Packs/day: 2.00    Years: 43.00    Pack years: 86.00    Types: Cigarettes  . Smokeless tobacco: Never Used  Substance and Sexual Activity  . Alcohol use: No    Alcohol/week: 0.0 standard drinks  . Drug use: No  . Sexual activity: Not Currently    Birth control/protection: Surgical, Post-menopausal    Comment: tubal  Other Topics  Concern  . Not on file  Social History Narrative  . Not on file   Social Determinants of Health   Financial Resource Strain: Not on file  Food Insecurity: No Food Insecurity  . Worried About Charity fundraiser in the Last Year: Never true  . Ran Out of Food in the Last Year: Never true  Transportation Needs: No Transportation Needs  . Lack of Transportation (Medical): No  . Lack of Transportation (Non-Medical): No  Physical Activity: Not on file  Stress: Not on file  Social Connections: Not on file  Intimate Partner Violence: Not on file    Family History  Problem Relation Age of Onset  . Schizophrenia Brother   . Stroke Brother   . Cancer Mother        unsure what type  . Heart disease Father   . Heart attack Father   . Coronary artery disease Other   . Diabetes type II Other   . Cancer Son        bladder  . Colon cancer Neg Hx     BP (!) 159/68   Pulse 76   Ht 5\' 4"  (1.626 m)   Wt 170 lb 4 oz (77.2 kg)   BMI 29.22 kg/m   Body mass index is 29.22 kg/m.      Objective:   Physical Exam Vitals and nursing note reviewed. Exam conducted with a chaperone present.  Constitutional:      Appearance: She is well-developed.  HENT:     Head: Normocephalic and atraumatic.  Eyes:     Conjunctiva/sclera: Conjunctivae normal.     Pupils: Pupils are  equal, round, and reactive to light.  Cardiovascular:     Rate and Rhythm: Normal rate and regular rhythm.  Pulmonary:     Effort: Pulmonary effort is normal.  Abdominal:     Palpations: Abdomen is soft.  Musculoskeletal:     Cervical back: Normal range of motion and neck supple.       Feet:  Skin:    General: Skin is warm and dry.  Neurological:     Mental Status: She is alert and oriented to person, place, and time.     Cranial Nerves: No cranial nerve deficit.     Motor: No abnormal muscle tone.     Coordination: Coordination normal.     Deep Tendon Reflexes: Reflexes are normal and symmetric. Reflexes normal.  Psychiatric:        Behavior: Behavior normal.        Thought Content: Thought content normal.        Judgment: Judgment normal.   X-rays were done of the left foot, reported separately.  She has swelling of the left calf and lower leg and positive Homan's sign left.  I will get stat Doppler      Assessment & Plan:   Encounter Diagnoses  Name Primary?  . closed fracture proximal phalanx second toe left nondisplaced. Yes  . Closed nondisplaced fracture of proximal phalanx of toe of left foot   . Edema of left foot   . Cellulitis of left foot   . Diabetic mononeuropathy associated with diabetes mellitus due to underlying condition ([place])    I will give samples of doxycycline 100 mgm daily.  I will get stat Doppler to rule out blood clot.  Study was done and negative. She has been informed.  I will begin PT.  She is to check and see what her last A1C was from  Dr. Legrand Rams.  Return in one week.  Elevate foot.  If redness or swelling gets worse, go to ER.  Call if any problem.  Precautions discussed.   Electronically Signed Sanjuana Kava, MD 3/29/20224:46 PM

## 2021-02-03 NOTE — Telephone Encounter (Signed)
Called her to advise her the doppler did not show a blood clot. Per Dr Luna Glasgow.

## 2021-02-03 NOTE — Telephone Encounter (Signed)
Claudia [facility] with [facility] called to verify if the patient is going to physical therapy. If Dr. Luna Glasgow wants Physical therapy I will send the order goes to PT.  I am waiting on the note from Dr. Luna Glasgow.

## 2021-02-04 NOTE — Telephone Encounter (Signed)
I have placed the orders for Physical therapy through [facility]. I left Kristi a message as well to call back with any questions.

## 2021-02-06 DIAGNOSIS — S92912S Unspecified fracture of left toe(s), sequela: Secondary | ICD-10-CM | POA: Diagnosis not present

## 2021-02-06 DIAGNOSIS — J1282 Pneumonia due to coronavirus disease 2019: Secondary | ICD-10-CM | POA: Diagnosis not present

## 2021-02-06 DIAGNOSIS — E0865 Diabetes mellitus due to underlying condition with hyperglycemia: Secondary | ICD-10-CM | POA: Diagnosis not present

## 2021-02-06 DIAGNOSIS — R2681 Unsteadiness on feet: Secondary | ICD-10-CM | POA: Diagnosis not present

## 2021-02-06 DIAGNOSIS — J449 Chronic obstructive pulmonary disease, unspecified: Secondary | ICD-10-CM | POA: Diagnosis not present

## 2021-02-10 ENCOUNTER — Ambulatory Visit: Payer: Medicare HMO | Admitting: Orthopaedic Surgery

## 2021-02-10 DIAGNOSIS — J440 Chronic obstructive pulmonary disease with acute lower respiratory infection: Secondary | ICD-10-CM | POA: Diagnosis not present

## 2021-02-10 DIAGNOSIS — N390 Urinary tract infection, site not specified: Secondary | ICD-10-CM | POA: Diagnosis not present

## 2021-02-10 DIAGNOSIS — U071 COVID-19: Secondary | ICD-10-CM | POA: Diagnosis not present

## 2021-02-10 DIAGNOSIS — I10 Essential (primary) hypertension: Secondary | ICD-10-CM | POA: Diagnosis not present

## 2021-02-10 DIAGNOSIS — E1140 Type 2 diabetes mellitus with diabetic neuropathy, unspecified: Secondary | ICD-10-CM | POA: Diagnosis not present

## 2021-02-10 DIAGNOSIS — J1282 Pneumonia due to coronavirus disease 2019: Secondary | ICD-10-CM | POA: Diagnosis not present

## 2021-02-10 DIAGNOSIS — S92502D Displaced unspecified fracture of left lesser toe(s), subsequent encounter for fracture with routine healing: Secondary | ICD-10-CM | POA: Diagnosis not present

## 2021-02-10 DIAGNOSIS — S92522D Displaced fracture of medial phalanx of left lesser toe(s), subsequent encounter for fracture with routine healing: Secondary | ICD-10-CM | POA: Diagnosis not present

## 2021-02-10 DIAGNOSIS — R69 Illness, unspecified: Secondary | ICD-10-CM | POA: Diagnosis not present

## 2021-02-15 DIAGNOSIS — R69 Illness, unspecified: Secondary | ICD-10-CM | POA: Diagnosis not present

## 2021-02-15 DIAGNOSIS — E1165 Type 2 diabetes mellitus with hyperglycemia: Secondary | ICD-10-CM | POA: Diagnosis not present

## 2021-02-16 DIAGNOSIS — R69 Illness, unspecified: Secondary | ICD-10-CM | POA: Diagnosis not present

## 2021-02-16 DIAGNOSIS — F17200 Nicotine dependence, unspecified, uncomplicated: Secondary | ICD-10-CM | POA: Diagnosis not present

## 2021-02-16 DIAGNOSIS — R609 Edema, unspecified: Secondary | ICD-10-CM | POA: Diagnosis not present

## 2021-02-16 DIAGNOSIS — L03116 Cellulitis of left lower limb: Secondary | ICD-10-CM | POA: Diagnosis not present

## 2021-02-17 DIAGNOSIS — J440 Chronic obstructive pulmonary disease with acute lower respiratory infection: Secondary | ICD-10-CM | POA: Diagnosis not present

## 2021-02-17 DIAGNOSIS — S92522D Displaced fracture of medial phalanx of left lesser toe(s), subsequent encounter for fracture with routine healing: Secondary | ICD-10-CM | POA: Diagnosis not present

## 2021-02-17 DIAGNOSIS — I10 Essential (primary) hypertension: Secondary | ICD-10-CM | POA: Diagnosis not present

## 2021-02-17 DIAGNOSIS — S92502D Displaced unspecified fracture of left lesser toe(s), subsequent encounter for fracture with routine healing: Secondary | ICD-10-CM | POA: Diagnosis not present

## 2021-02-17 DIAGNOSIS — E1140 Type 2 diabetes mellitus with diabetic neuropathy, unspecified: Secondary | ICD-10-CM | POA: Diagnosis not present

## 2021-02-17 DIAGNOSIS — J1282 Pneumonia due to coronavirus disease 2019: Secondary | ICD-10-CM | POA: Diagnosis not present

## 2021-02-17 DIAGNOSIS — U071 COVID-19: Secondary | ICD-10-CM | POA: Diagnosis not present

## 2021-02-17 DIAGNOSIS — R69 Illness, unspecified: Secondary | ICD-10-CM | POA: Diagnosis not present

## 2021-02-17 DIAGNOSIS — N390 Urinary tract infection, site not specified: Secondary | ICD-10-CM | POA: Diagnosis not present

## 2021-02-18 ENCOUNTER — Other Ambulatory Visit: Payer: Self-pay | Admitting: *Deleted

## 2021-02-18 ENCOUNTER — Other Ambulatory Visit: Payer: Self-pay | Admitting: Podiatry

## 2021-02-18 ENCOUNTER — Other Ambulatory Visit: Payer: Self-pay

## 2021-02-18 DIAGNOSIS — I739 Peripheral vascular disease, unspecified: Secondary | ICD-10-CM

## 2021-02-18 NOTE — Patient Outreach (Signed)
[facility]) Care Management  [place]  02/18/2021   Claudia Keller May 11, 1951 [id number]   Home visit for chronic disease management: DM  Subjective: Claudia Keller has given up her cat as she had multiple scratches which had caused several episodes of cellulitis.  Saw ortho for f/u on toe fxs, healing, had doppler clear. Walking with walker. Was able to take a bath independently this week, sitting on shower chair without a problem. Has emergency alert. Will have LE vascular diagnostics this month. On antibiotic for slight redness in L toes.  Still seeing psychiatrist. q 3 months. Has lift chair now. Going to apply for [facility] services for in home care and will go to the center 2 days a week. Continues to smoke. Reports she received Moderna 1 and 2 COVID vaccines.  Objective: BP 142/60, Pulse: 70 Resp: 18 Skin: Skin is 100% improved, cat scratches have all healed. One small skin tear on LUE 1 cm Dry hard scabs recedeing on L great toe , 3rd toe at cuticle edge and heel. Slight erythema in 2nd and 3rd toes. Heart: Reg irreg with systolic murmur Lungs with scattered faint wheezes LLE 2+ edema sits with ankle under R leg Faint pedal pulses R foot cool, L foot warm, long nails.    Encounter Medications:  Outpatient Encounter Medications as of 02/18/2021  Medication Sig Note  . albuterol (VENTOLIN HFA) 108 (90 Base) MCG/ACT inhaler Inhale 2 puffs into the lungs every 4 (four) hours as needed for wheezing or shortness of breath.   Marland Kitchen atorvastatin (LIPITOR) 20 MG tablet Take 1 tablet (20 mg total) by mouth at bedtime.   . Calcium Carb-Cholecalciferol (OYSTER SHELL CALCIUM W/D) 500-200 MG-UNIT TABS Take 1 tablet by mouth 2 (two) times daily.   . cetirizine (ZYRTEC) 10 MG tablet Take 10 mg by mouth daily.   . colestipol (COLESTID) 1 g tablet TAKE 2 TABLETS DAILY FOR DIARRHEA. DO NOT TAKE WITHIN 2 HOURS OF OTHER ORAL MEDICATIONS. HOLD FOR CONSTIPATION.   Marland Kitchen diltiazem (CARDIZEM CD)  120 MG 24 hr capsule Take 1 capsule (120 mg total) by mouth daily.   . divalproex (DEPAKOTE ER) 500 MG 24 hr tablet Take 2 tablets (1,000 mg total) by mouth every evening.   . divalproex (DEPAKOTE) 250 MG DR tablet Take 1 tablet (250 mg total) by mouth daily.   Marland Kitchen doxycycline (VIBRAMYCIN) 100 MG capsule Take 1 capsule (100 mg total) by mouth daily. Gave patient from stock bottle lot [id number] exp 07/28/22 told her to not take with dairy stay out of sunlight   . furosemide (LASIX) 20 MG tablet Take 20 mg by mouth daily.   Marland Kitchen gabapentin (NEURONTIN) 100 MG capsule Take 100 mg by mouth 3 (three) times daily.   Marland Kitchen guaiFENesin-dextromethorphan (ROBITUSSIN DM) 100-10 MG/5ML syrup Take 10 mLs by mouth every 4 (four) hours as needed for cough. 12/07/2020: Not started yet  . insulin detemir (LEVEMIR) 100 UNIT/ML injection Inject 0.1 mLs (10 Units total) into the skin 2 (two) times daily.   . insulin lispro (HUMALOG) 100 UNIT/ML injection Inject 15-24 Units into the skin 3 (three) times daily with meals. Per sliding scale. 151-200=18 units; 201-250= 9 units; 251-300=20 units; 301-350=21 units; 351-400= 22 units; 401-450=23 units; 500-551=24 units.   Marland Kitchen levothyroxine (SYNTHROID) 100 MCG tablet Take 2 tablets (200 mcg total) by mouth daily before breakfast.   . linagliptin (TRADJENTA) 5 MG TABS tablet Take 5 mg by mouth daily.    Marland Kitchen lisinopril (PRINIVIL,ZESTRIL)  20 MG tablet Take 20 mg by mouth daily.   . metFORMIN (GLUCOPHAGE) 500 MG tablet Take 500 mg by mouth 2 (two) times daily.   . mirtazapine (REMERON) 7.5 MG tablet Take 1 tablet (7.5 mg total) by mouth at bedtime.   . nicotine polacrilex (NICORETTE) 4 MG gum Take 1 each (4 mg total) by mouth as needed for smoking cessation.   Marland Kitchen NOVOLOG FLEXPEN 100 UNIT/ML FlexPen Inject 5 Units into the skin in the morning, at noon, and at bedtime.   . ondansetron (ZOFRAN) 4 MG tablet Take 1 tablet (4 mg total) by mouth every 8 (eight) hours as needed for nausea or vomiting.   .  pantoprazole (PROTONIX) 40 MG tablet TAKE 1 TABLET BY MOUTH ONCE DAILY 30 MINTUES BEFORE BREAKFAST. (Patient taking differently: Take 40 mg by mouth daily before breakfast.)   . PARoxetine (PAXIL) 40 MG tablet Take 1 tablet (40 mg total) by mouth daily.   . QUEtiapine (SEROQUEL) 100 MG tablet Take 1 tablet (100 mg total) by mouth at bedtime.    No facility-administered encounter medications on file as of 02/18/2021.    Functional Status:  In your present state of health, do you have any difficulty performing the following activities: 01/12/2021 12/07/2020  Hearing? N N  Vision? N N  Difficulty concentrating or making decisions? N Y  Walking or climbing stairs? Y Y  Dressing or bathing? Y N  Doing errands, shopping? Claudia Keller  Preparing Food and eating ? Y -  Using the Toilet? N -  In the past six months, have you accidently leaked urine? Y -  Do you have problems with loss of bowel control? N -  Managing your Medications? N -  Managing your Finances? N -  Housekeeping or managing your Housekeeping? N -  Some recent data might be hidden    Fall/Depression Screening: Fall Risk  01/12/2021 09/07/2016 08/17/2016  Falls in the past year? 1 No No  Number falls in past yr: 1 - -  Injury with Fall? 1 - -  Risk for fall due to : History of fall(s);Impaired balance/gait;Impaired mobility - -  Follow up Falls evaluation completed - -   PHQ 2/9 Scores 01/12/2021 09/15/2016 09/07/2016 08/17/2016 09/22/2015  PHQ - 2 Score 0 1 0 0 0   Assessment: Cellulitis resolving                        DM                        Reduced risk for falls                        Onychomycotic toenails  Goals Addressed            This Visit's Progress   . Eat Healthy as evidenced by HgbA1C approaching goal over the next 3 months.   On track    Timeframe:  Short-Term Goal Priority:  High Start Date 01/12/21                             Expected End Date:    05/07/21                   Follow Up Date 02/09/21  - drink 6  to 8 glasses of water each day - fill half of plate with  vegetables - limit fast food meals to no more than 1 per week - read food labels for fat, fiber, carbohydrates and portion size    Why is this important?    When you are ready to manage your nutrition or weight, having a plan and setting goals will help.   Taking small steps to change how you eat and exercise is a good place to start.    Notes: 01/12/21 Pt HgbA1c not at goal 10.3 - FBS today was 427! Need to enlist son to assist with appropriate food choices, will provide list of good food choices. Pt does not drink enough fluids, encouraged at least 6 cups of water/day. 02/19/21 NP had called pt son after last visit and discussed need for improved nutrition, avoiding excess carbs. Encouraged more vegetables and fresh fruits. Today glucose vales reported as improved and diet also. She is drinking more water.    . Obtain Eye Exam-Diabetes Type 2 as evidenced by pt report over the next 3 months.   Not on track    Follow Up Date 04/06/21  - schedule appointment with eye doctor    Why is this important?    Eye check-ups are important when you have diabetes.   Vision loss can be prevented.    Notes: 01/12/21 Will request that son schedule diabetic eye exam within the next month. 02/19/21 Has not had her eye exam yet. She is aware she still needs to do this and says she will ask her son to schedule for her.    . Set My Target A1C-Diabetes Type 2 today per ADA recommendations and document. Pt HgbA1C will be < last 10.3 on next check within the next 3 months, glucose levels WNL 25% of the time on random week review each month.   On track    Timeframe:  Long term goal Priority:  High Start Date:     01/12/21                        Expected End Date:    05/07/21      Follow Up Date: 04/06/21  - set target A1C  (7.0)   Notes: Current A1C 10.3. Today's FBS was 427, other readings mostly in 200's , one 170 and one 60. Did have some nausea and  weakness with low, knows to eat and drink something 15 Gms carbs. 02/19/21 Glucose readings improved, FBS usually under 110, NFBS are usually in the 200s HgbA1C not checked yet. Request Dr. Legrand Rams advise when done and value. Reviewed normal values goals and complications if not well controlled by diet. She is aware and will strive to improve.      Plan: Debrided toenails.  Follow-up:  Patient agrees to Care Plan and Follow-up. In one month.  Eulah Pont. Myrtie Neither, MSN, [facility] Gerontological Nurse Practitioner [facility] Care Management [phone]

## 2021-02-20 ENCOUNTER — Encounter: Payer: Self-pay | Admitting: Psychiatry

## 2021-02-20 ENCOUNTER — Telehealth (INDEPENDENT_AMBULATORY_CARE_PROVIDER_SITE_OTHER): Payer: Medicare HMO | Admitting: Psychiatry

## 2021-02-20 ENCOUNTER — Other Ambulatory Visit: Payer: Self-pay

## 2021-02-20 DIAGNOSIS — F4310 Post-traumatic stress disorder, unspecified: Secondary | ICD-10-CM

## 2021-02-20 DIAGNOSIS — F3181 Bipolar II disorder: Secondary | ICD-10-CM

## 2021-02-20 DIAGNOSIS — R69 Illness, unspecified: Secondary | ICD-10-CM | POA: Diagnosis not present

## 2021-02-20 MED ORDER — MIRTAZAPINE 7.5 MG PO TABS: 7.5000 mg | ORAL_TABLET | Freq: Every day | ORAL | 0 refills | Status: DC

## 2021-02-20 MED ORDER — PAROXETINE HCL 40 MG PO TABS: 40.0000 mg | ORAL_TABLET | Freq: Every day | ORAL | 0 refills | Status: DC

## 2021-02-20 MED ORDER — DIVALPROEX SODIUM ER 500 MG PO TB24: 1000.0000 mg | ORAL_TABLET | Freq: Every evening | ORAL | 0 refills | Status: DC

## 2021-02-20 MED ORDER — DIVALPROEX SODIUM 250 MG PO DR TAB
250.0000 mg | DELAYED_RELEASE_TABLET | Freq: Every day | ORAL | 0 refills | Status: DC
Start: 2021-02-20 — End: 2021-12-31

## 2021-02-20 MED ORDER — QUETIAPINE FUMARATE 100 MG PO TABS: 100.0000 mg | ORAL_TABLET | Freq: Every day | ORAL | 0 refills | Status: DC
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
## 2021-02-20 NOTE — Patient Instructions (Signed)
1. Continue Paxil 40 mg daily  2. Continue mirtazapine 7.5 mg at night 3. Continue Depakote 250 mg in AM, 1000mg  at night 4. Continue quetiapine 100 mg at night 5. Next appointment: 7/12 at 3 PM

## 2021-02-23 ENCOUNTER — Ambulatory Visit (HOSPITAL_COMMUNITY): Admission: RE | Admit: 2021-02-23 | Payer: Medicare HMO | Source: Ambulatory Visit

## 2021-02-24 ENCOUNTER — Ambulatory Visit (HOSPITAL_COMMUNITY)
Admission: RE | Admit: 2021-02-24 | Discharge: 2021-02-24 | Disposition: A | Discharge status: Home / Self Care | Payer: Medicare HMO | Source: Ambulatory Visit | Attending: Podiatry | Admitting: Podiatry

## 2021-02-24 DIAGNOSIS — I739 Peripheral vascular disease, unspecified: Secondary | ICD-10-CM

## 2021-02-24 DIAGNOSIS — I70213 Atherosclerosis of native arteries of extremities with intermittent claudication, bilateral legs: Secondary | ICD-10-CM | POA: Diagnosis not present

## 2021-03-02 DIAGNOSIS — I739 Peripheral vascular disease, unspecified: Secondary | ICD-10-CM | POA: Diagnosis not present

## 2021-03-02 DIAGNOSIS — F17200 Nicotine dependence, unspecified, uncomplicated: Secondary | ICD-10-CM | POA: Diagnosis not present

## 2021-03-02 DIAGNOSIS — R69 Illness, unspecified: Secondary | ICD-10-CM | POA: Diagnosis not present

## 2021-03-23 ENCOUNTER — Other Ambulatory Visit: Payer: Self-pay | Admitting: *Deleted

## 2021-03-30 ENCOUNTER — Ambulatory Visit: Payer: Self-pay | Admitting: *Deleted

## 2021-04-02 ENCOUNTER — Other Ambulatory Visit: Payer: Self-pay | Admitting: *Deleted

## 2021-04-02 DIAGNOSIS — I739 Peripheral vascular disease, unspecified: Secondary | ICD-10-CM

## 2021-04-13 ENCOUNTER — Other Ambulatory Visit: Payer: Self-pay | Admitting: *Deleted

## 2021-04-13 NOTE — Patient Outreach (Signed)
[facility]) Care Management  [place]  04/13/2021   SOLARIS KRAM 04-Dec-1950 [id number]  Subjective: Second telephone outreach to follow up on DM  Encounter Medications:  Outpatient Encounter Medications as of 04/13/2021  Medication Sig Note  . albuterol (VENTOLIN HFA) 108 (90 Base) MCG/ACT inhaler Inhale 2 puffs into the lungs every 4 (four) hours as needed for wheezing or shortness of breath.   Marland Kitchen atorvastatin (LIPITOR) 20 MG tablet Take 1 tablet (20 mg total) by mouth at bedtime.   . Calcium Carb-Cholecalciferol (OYSTER SHELL CALCIUM W/D) 500-200 MG-UNIT TABS Take 1 tablet by mouth 2 (two) times daily.   . cetirizine (ZYRTEC) 10 MG tablet Take 10 mg by mouth daily.   . colestipol (COLESTID) 1 g tablet TAKE 2 TABLETS DAILY FOR DIARRHEA. DO NOT TAKE WITHIN 2 HOURS OF OTHER ORAL MEDICATIONS. HOLD FOR CONSTIPATION.   Marland Kitchen diltiazem (CARDIZEM CD) 120 MG 24 hr capsule Take 1 capsule (120 mg total) by mouth daily.   . divalproex (DEPAKOTE ER) 500 MG 24 hr tablet Take 2 tablets (1,000 mg total) by mouth every evening.   . divalproex (DEPAKOTE) 250 MG DR tablet Take 1 tablet (250 mg total) by mouth daily.   Marland Kitchen doxycycline (VIBRAMYCIN) 100 MG capsule Take 1 capsule (100 mg total) by mouth daily. Gave patient from stock bottle lot [id number] exp 07/28/22 told her to not take with dairy stay out of sunlight   . furosemide (LASIX) 20 MG tablet Take 20 mg by mouth daily.   Marland Kitchen gabapentin (NEURONTIN) 100 MG capsule Take 100 mg by mouth 3 (three) times daily.   Marland Kitchen guaiFENesin-dextromethorphan (ROBITUSSIN DM) 100-10 MG/5ML syrup Take 10 mLs by mouth every 4 (four) hours as needed for cough. 12/07/2020: Not started yet  . insulin detemir (LEVEMIR) 100 UNIT/ML injection Inject 0.1 mLs (10 Units total) into the skin 2 (two) times daily.   . insulin lispro (HUMALOG) 100 UNIT/ML injection Inject 15-24 Units into the skin 3 (three) times daily with meals. Per sliding scale. 151-200=18 units;  201-250= 9 units; 251-300=20 units; 301-350=21 units; 351-400= 22 units; 401-450=23 units; 500-551=24 units.   Marland Kitchen levothyroxine (SYNTHROID) 100 MCG tablet Take 2 tablets (200 mcg total) by mouth daily before breakfast.   . linagliptin (TRADJENTA) 5 MG TABS tablet Take 5 mg by mouth daily.    Marland Kitchen lisinopril (PRINIVIL,ZESTRIL) 20 MG tablet Take 20 mg by mouth daily.   . metFORMIN (GLUCOPHAGE) 500 MG tablet Take 500 mg by mouth 2 (two) times daily.   . mirtazapine (REMERON) 7.5 MG tablet Take 1 tablet (7.5 mg total) by mouth at bedtime.   . nicotine polacrilex (NICORETTE) 4 MG gum Take 1 each (4 mg total) by mouth as needed for smoking cessation.   Marland Kitchen NOVOLOG FLEXPEN 100 UNIT/ML FlexPen Inject 5 Units into the skin in the morning, at noon, and at bedtime.   . ondansetron (ZOFRAN) 4 MG tablet Take 1 tablet (4 mg total) by mouth every 8 (eight) hours as needed for nausea or vomiting.   . pantoprazole (PROTONIX) 40 MG tablet TAKE 1 TABLET BY MOUTH ONCE DAILY 30 MINTUES BEFORE BREAKFAST. (Patient taking differently: Take 40 mg by mouth daily before breakfast.)   . PARoxetine (PAXIL) 40 MG tablet Take 1 tablet (40 mg total) by mouth daily.   . QUEtiapine (SEROQUEL) 100 MG tablet Take 1 tablet (100 mg total) by mouth at bedtime.    No facility-administered encounter medications on file as of 04/13/2021.  Functional Status:  In your present state of health, do you have any difficulty performing the following activities: 01/12/2021 12/07/2020  Hearing? N N  Vision? N N  Difficulty concentrating or making decisions? N Y  Walking or climbing stairs? Y Y  Dressing or bathing? Y N  Doing errands, shopping? [name]  Preparing Food and eating ? Y -  Using the Toilet? N -  In the past six months, have you accidently leaked urine? Y -  Do you have problems with loss of bowel control? N -  Managing your Medications? N -  Managing your Finances? N -  Housekeeping or managing your Housekeeping? N -  Some recent data  might be hidden    Fall/Depression Screening: Fall Risk  01/12/2021 09/07/2016 08/17/2016  Falls in the past year? 1 No No  Number falls in past yr: 1 - -  Injury with Fall? 1 - -  Risk for fall due to : History of fall(s);Impaired balance/gait;Impaired mobility - -  Follow up Falls evaluation completed - -   PHQ 2/9 Scores 01/12/2021 09/15/2016 09/07/2016 08/17/2016 09/22/2015  PHQ - 2 Score 0 1 0 0 0  Some encounter information is confidential and restricted. Go to Review Flowsheets activity to see all data.    Assessment: Historically poor glucose control - DM                           Care Plan  Goals Addressed            This Visit's Progress   . COMPLETED: Eat Healthy as evidenced by HgbA1C approaching goal over the next 3 months.       Timeframe:  Short-Term Goal Priority:  High Start Date 01/12/21                             Expected End Date:    05/07/21                   Follow Up Date 02/09/21  - drink 6 to 8 glasses of water each day - fill half of plate with vegetables - limit fast food meals to no more than 1 per week - read food labels for fat, fiber, carbohydrates and portion size    Why is this important?    When you are ready to manage your nutrition or weight, having a plan and setting goals will help.   Taking small steps to change how you eat and exercise is a good place to start.    Notes: 01/12/21 Pt HgbA1c not at goal 10.3 - FBS today was 427! Need to enlist son to assist with appropriate food choices, will provide list of good food choices. Pt does not drink enough fluids, encouraged at least 6 cups of water/day. 02/19/21 NP had called pt son after last visit and discussed need for improved nutrition, avoiding excess carbs. Encouraged more vegetables and fresh fruits. Today glucose vales reported as improved and diet also. She is drinking more water. 04/13/21 Pt is now going to [facility] in [place] twice a week. She is getting very healthy meals there. Her son is  bringing her healthier foods.     . Obtain Eye Exam-Diabetes Type 2 as evidenced by pt report over the next 3 months.   Not on track    Follow Up Date 04/06/21  - schedule appointment with eye doctor  Why is this important?    Eye check-ups are important when you have diabetes.   Vision loss can be prevented.    Notes: 01/12/21 Will request that son schedule diabetic eye exam within the next month. 02/19/21 Has not had her eye exam yet. She is aware she still needs to do this and says she will ask her son to schedule for her. 04/13/21 Still has not had an eye exam. Will notify [facility]. Called [facility] and left a message for the provider that pt has annual wellness gaps including getting an eye exam and needing follow up labs since her HgbA1C was 10.2 in January.    . Set My Target A1C-Diabetes Type 2 today per ADA recommendations and document. Pt HgbA1C will be < last 10.3 on next check within the next 3 months, glucose levels WNL 25% of the time on random week review each month.   Not on track    Timeframe:  Long term goal Priority:  High Start Date:     01/12/21                        Expected End Date:    05/07/21      Follow Up Date: 04/06/21  - set target A1C  (7.0)   Notes: Current A1C 10.3. Today's FBS was 427, other readings mostly in 200's , one 170 and one 60. Did have some nausea and weakness with low, knows to eat and drink something 15 Gms carbs. 02/19/21 Glucose readings improved, FBS usually under 110, NFBS are usually in the 200s HgbA1C not checked yet. Request Dr. Legrand Rams advise when done and value. Reviewed normal values goals and complications if not well controlled by diet. She is aware and will strive to improve. 04/13/21 Pt unable to give me her glucose readings. No new labs (HgbA1C in Epic chart) Left a message at [facility] that this is needed.       Plan: Dr. Josephine Cables office will be getting an [facility] care manager and this NP will transition to the practice when they are ready for  patients. Other wise we agreed to talk again in one month.  Follow-up: Patient agrees to Care Plan and Follow-up.   Eulah Pont. Myrtie Neither, MSN, [facility] Gerontological Nurse Practitioner [facility] Care Management [phone]

## 2021-04-27 ENCOUNTER — Encounter: Payer: Medicare HMO | Admitting: Vascular Surgery
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
## 2021-05-19 ENCOUNTER — Telehealth: Payer: Self-pay | Admitting: Psychiatry

## 2021-05-19 ENCOUNTER — Other Ambulatory Visit: Payer: Self-pay

## 2021-05-19 ENCOUNTER — Telehealth: Payer: Medicare HMO | Admitting: Psychiatry

## 2021-05-19 NOTE — Telephone Encounter (Signed)
Contacted her for the appointment. She states that she is now with [facility] program, and she cannot see provider outside of this program. She agrees that discharge letter to be sent based on her request to transfer her care.

## 2021-05-20 ENCOUNTER — Encounter: Payer: Self-pay | Admitting: Vascular Surgery

## 2021-05-20 ENCOUNTER — Ambulatory Visit (INDEPENDENT_AMBULATORY_CARE_PROVIDER_SITE_OTHER): Payer: Medicare (Managed Care)

## 2021-05-20 ENCOUNTER — Other Ambulatory Visit: Payer: Self-pay | Admitting: *Deleted

## 2021-05-20 ENCOUNTER — Ambulatory Visit (INDEPENDENT_AMBULATORY_CARE_PROVIDER_SITE_OTHER): Payer: Medicare (Managed Care) | Admitting: Vascular Surgery

## 2021-05-20 VITALS — BP 127/80 | HR 91 | Temp 97.2°F | Ht 64.0 in | Wt 150.2 lb

## 2021-05-20 DIAGNOSIS — I739 Peripheral vascular disease, unspecified: Secondary | ICD-10-CM

## 2021-05-20 NOTE — Patient Outreach (Signed)
[facility]) Care Management  [place]  05/20/2021   Claudia Keller 03-31-1951 [id number]  Subjective: Telephone outreach for case closure as pt is now receiving all medical care from [facility].  Pt reports problems today, see below in goals.  Encounter Medications:  Outpatient Encounter Medications as of 05/20/2021  Medication Sig Note   albuterol (VENTOLIN HFA) 108 (90 Base) MCG/ACT inhaler Inhale 2 puffs into the lungs every 4 (four) hours as needed for wheezing or shortness of breath.    atorvastatin (LIPITOR) 20 MG tablet Take 1 tablet (20 mg total) by mouth at bedtime.    Calcium Carb-Cholecalciferol (OYSTER SHELL CALCIUM W/D) 500-200 MG-UNIT TABS Take 1 tablet by mouth 2 (two) times daily.    cetirizine (ZYRTEC) 10 MG tablet Take 10 mg by mouth daily.    colestipol (COLESTID) 1 g tablet TAKE 2 TABLETS DAILY FOR DIARRHEA. DO NOT TAKE WITHIN 2 HOURS OF OTHER ORAL MEDICATIONS. HOLD FOR CONSTIPATION.    diltiazem (CARDIZEM CD) 120 MG 24 hr capsule Take 1 capsule (120 mg total) by mouth daily.    divalproex (DEPAKOTE ER) 500 MG 24 hr tablet Take 2 tablets (1,000 mg total) by mouth every evening.    divalproex (DEPAKOTE) 250 MG DR tablet Take 1 tablet (250 mg total) by mouth daily.    doxycycline (VIBRAMYCIN) 100 MG capsule Take 1 capsule (100 mg total) by mouth daily. Gave patient from stock bottle lot [id number] exp 07/28/22 told her to not take with dairy stay out of sunlight    furosemide (LASIX) 20 MG tablet Take 20 mg by mouth daily.    gabapentin (NEURONTIN) 100 MG capsule Take 100 mg by mouth 3 (three) times daily.    guaiFENesin-dextromethorphan (ROBITUSSIN DM) 100-10 MG/5ML syrup Take 10 mLs by mouth every 4 (four) hours as needed for cough. 12/07/2020: Not started yet   insulin detemir (LEVEMIR) 100 UNIT/ML injection Inject 0.1 mLs (10 Units total) into the skin 2 (two) times daily.    insulin lispro (HUMALOG) 100 UNIT/ML injection Inject 15-24 Units into the  skin 3 (three) times daily with meals. Per sliding scale. 151-200=18 units; 201-250= 9 units; 251-300=20 units; 301-350=21 units; 351-400= 22 units; 401-450=23 units; 500-551=24 units.    levothyroxine (SYNTHROID) 100 MCG tablet Take 2 tablets (200 mcg total) by mouth daily before breakfast.    linagliptin (TRADJENTA) 5 MG TABS tablet Take 5 mg by mouth daily.     lisinopril (PRINIVIL,ZESTRIL) 20 MG tablet Take 20 mg by mouth daily.    metFORMIN (GLUCOPHAGE) 500 MG tablet Take 500 mg by mouth 2 (two) times daily.    mirtazapine (REMERON) 7.5 MG tablet Take 1 tablet (7.5 mg total) by mouth at bedtime.    nicotine polacrilex (NICORETTE) 4 MG gum Take 1 each (4 mg total) by mouth as needed for smoking cessation.    NOVOLOG FLEXPEN 100 UNIT/ML FlexPen Inject 5 Units into the skin in the morning, at noon, and at bedtime.    ondansetron (ZOFRAN) 4 MG tablet Take 1 tablet (4 mg total) by mouth every 8 (eight) hours as needed for nausea or vomiting.    pantoprazole (PROTONIX) 40 MG tablet TAKE 1 TABLET BY MOUTH ONCE DAILY 30 MINTUES BEFORE BREAKFAST. (Patient taking differently: Take 40 mg by mouth daily before breakfast.)    PARoxetine (PAXIL) 40 MG tablet Take 1 tablet (40 mg total) by mouth daily.    QUEtiapine (SEROQUEL) 100 MG tablet Take 1 tablet (100 mg total) by mouth at  bedtime.    No facility-administered encounter medications on file as of 05/20/2021.    Functional Status:  In your present state of health, do you have any difficulty performing the following activities: 01/12/2021 12/07/2020  Hearing? N N  Vision? N N  Difficulty concentrating or making decisions? N Y  Walking or climbing stairs? Y Y  Dressing or bathing? Y N  Doing errands, shopping? [name]  Preparing Food and eating ? Y -  Using the Toilet? N -  In the past six months, have you accidently leaked urine? Y -  Do you have problems with loss of bowel control? N -  Managing your Medications? N -  Managing your Finances? N -   Housekeeping or managing your Housekeeping? N -  Some recent data might be hidden    Fall/Depression Screening: Fall Risk  01/12/2021 09/07/2016 08/17/2016  Falls in the past year? 1 No No  Number falls in past yr: 1 - -  Injury with Fall? 1 - -  Risk for fall due to : History of fall(s);Impaired balance/gait;Impaired mobility - -  Follow up Falls evaluation completed - -   PHQ 2/9 Scores 01/12/2021 09/15/2016 09/07/2016 08/17/2016 09/22/2015  PHQ - 2 Score 0 1 0 0 0  Some encounter information is confidential and restricted. Go to Review Flowsheets activity to see all data.    Assessment: Confusion                         DM - not checking glucose nor taking her insulin  Care Plan   Goals Addressed             This Visit's Progress    COMPLETED: Obtain Eye Exam-Diabetes Type 2 as evidenced by pt report over the next 3 months.       Follow Up Date 04/06/21  - schedule appointment with eye doctor    Why is this important?   Eye check-ups are important when you have diabetes.  Vision loss can be prevented.    Notes: 01/12/21 Will request that son schedule diabetic eye exam within the next month. 02/19/21 Has not had her eye exam yet. She is aware she still needs to do this and says she will ask her son to schedule for her. 04/13/21 Still has not had an eye exam. Will notify [facility]. Called [facility] and left a message for the provider that pt has annual wellness gaps including getting an eye exam and needing follow up labs since her HgbA1C was 10.2 in January. 05/20/21 Pt reports no eye exam yet and doesn't have one scheduled through [facility] that she knows of. Called [facility] and talked with Darrelyn Hillock, RN ad advised pt needs this exam. NP closing this case as she has [facility] and non [facility] provider.      Set My Target A1C-Diabetes Type 2 today per ADA recommendations and document. Pt HgbA1C will be < last 10.3 on next check within the next 3 months, glucose levels WNL 25% of the time on random week  review each month.       Timeframe:  Long term goal Priority:  High Start Date:     01/12/21                        Expected End Date:    05/07/21      Follow Up Date: 04/06/21  - set target A1C  (7.0)   Notes:  Current A1C 10.3. Today's FBS was 427, other readings mostly in 200's , one 170 and one 60. Did have some nausea and weakness with low, knows to eat and drink something 15 Gms carbs. 02/19/21 Glucose readings improved, FBS usually under 110, NFBS are usually in the 200s HgbA1C not checked yet. Request Dr. Legrand Rams advise when done and value. Reviewed normal values goals and complications if not well controlled by diet. She is aware and will strive to improve. 04/13/21 Pt unable to give me her glucose readings. No new labs (HgbA1C in Epic chart) Left a message at [facility] that this is needed. 05/20/21 Pt has not been checking her glucose levels nor taking her insulin. She is confused today. She reports she has multiple monitors and some strips but she can't figure out which strips go with each monitor. This is out of the ordinary for her. Advised this needs to be resolved today and her glucose needs to be checked, she agrees. Called pt son and updated on pt status, requested he check on her this evening and help her get organized and check her glucose level. He says he has not observed any confusion this is new. He agrees to do so and we will talk again in the am. Called [facility] and advised of pt condition. Nurse, Darrelyn Hillock, advised that when they take pt to her MD appt today they will check her over at the center. NP to follow up tomorrow on pt status.         Plan: As noted above will f/u tomorrow to ensure DM issues are resolved and then will close her case. Follow-up: Follow-up in 1 day(s)  Amaura Authier C. Myrtie Neither, MSN, [facility] Gerontological Nurse Practitioner [facility] [phone]

## 2021-05-20 NOTE — Progress Notes (Signed)
[facility] Specialist of [place]  Patient name: Claudia Keller MRN: [id number] DOB: October 04, 1951 Sex: female  REASON FOR CONSULT: Evaluation bilateral lower extremity arterial insufficiency  HPI: Claudia Keller is a 70 y.o. female, who is here today for discussion of lower extremity arterial insufficiency.  She reports that she fell and had a fracture of toes on her left foot.  Apparently she had a rehabilitation stay following this and initially was in a wheelchair and is now walking with a walker.  She did have some ulcerations and cellulitis on her left foot and is seen today for further evaluation of this.  She had noninvasive studies at [facility] in April and we have repeated these today in our office.  She does not walk to the level of claudication.  She does not have any arterial rest pain.  She reports the ulceration is improved on her left foot.  She has never had difficulty in her right foot.  She is a lifelong cigarette smoker and is not insulin-dependent diabetic  Past Medical History:  Diagnosis Date   Arthritis    COPD (chronic obstructive pulmonary disease) (HCC)    Diverticulosis    Essential hypertension, benign    Hx of colonic polyps    Hypothyroidism    IBS (irritable bowel syndrome)    Irritable bowel syndrome    Ketoacidosis    Mixed hyperlipidemia    PTSD (post-traumatic stress disorder)    S/P colonoscopy Jan 2011   RMR: pancolonic diverticula, polypoid rectal mucosa with  prominent lymphoid aggregates, repeat in Jan 2016    Shortness of breath dyspnea    Stress incontinence, female    Thyroid disease    Tobacco use    Type 2 diabetes mellitus ([place])     Family History  Problem Relation Age of Onset   Schizophrenia Brother    Stroke Brother    Cancer Mother        unsure what type   Heart disease Father    Heart attack Father    Coronary artery disease Other    Diabetes type II Other    Cancer Son         bladder   Colon cancer Neg Hx     SOCIAL HISTORY: Social History   Socioeconomic History   Marital status: Legally Separated    Spouse name: Not on file   Number of children: Not on file   Years of education: Not on file   Highest education level: Not on file  Occupational History   Not on file  Tobacco Use   Smoking status: Every Day    Packs/day: 2.00    Years: 43.00    Pack years: 86.00    Types: Cigarettes   Smokeless tobacco: Never  Substance and Sexual Activity   Alcohol use: No    Alcohol/week: 0.0 standard drinks   Drug use: No   Sexual activity: Not Currently    Birth control/protection: Surgical, Post-menopausal    Comment: tubal  Other Topics Concern   Not on file  Social History Narrative   Not on file   Social Determinants of Health   Financial Resource Strain: Not on file  Food Insecurity: No Food Insecurity   Worried About Running Out of Food in the Last Year: Never true   [place] in the Last Year: Never true  Transportation Needs: No Transportation Needs   Lack of Transportation (Medical): No   Lack  of Transportation (Non-Medical): No  Physical Activity: Not on file  Stress: Not on file  Social Connections: Not on file  Intimate Partner Violence: Not on file    No Known Allergies  Current Outpatient Medications  Medication Sig Dispense Refill   albuterol (VENTOLIN HFA) 108 (90 Base) MCG/ACT inhaler Inhale 2 puffs into the lungs every 4 (four) hours as needed for wheezing or shortness of breath. 8 g 1   atorvastatin (LIPITOR) 20 MG tablet Take 1 tablet (20 mg total) by mouth at bedtime.     Calcium Carb-Cholecalciferol (OYSTER SHELL CALCIUM W/D) 500-200 MG-UNIT TABS Take 1 tablet by mouth 2 (two) times daily.     cetirizine (ZYRTEC) 10 MG tablet Take 10 mg by mouth daily.     colestipol (COLESTID) 1 g tablet TAKE 2 TABLETS DAILY FOR DIARRHEA. DO NOT TAKE WITHIN 2 HOURS OF OTHER ORAL MEDICATIONS. HOLD FOR CONSTIPATION. 60 tablet 11    diltiazem (CARDIZEM CD) 120 MG 24 hr capsule Take 1 capsule (120 mg total) by mouth daily. 30 capsule 11   divalproex (DEPAKOTE ER) 500 MG 24 hr tablet Take 2 tablets (1,000 mg total) by mouth every evening. 180 tablet 0   divalproex (DEPAKOTE) 250 MG DR tablet Take 1 tablet (250 mg total) by mouth daily. 90 tablet 0   doxycycline (VIBRAMYCIN) 100 MG capsule Take 1 capsule (100 mg total) by mouth daily. Gave patient from stock bottle lot [id number] exp 07/28/22 told her to not take with dairy stay out of sunlight 7 capsule 0   furosemide (LASIX) 20 MG tablet Take 20 mg by mouth daily.     gabapentin (NEURONTIN) 100 MG capsule Take 100 mg by mouth 3 (three) times daily.     guaiFENesin-dextromethorphan (ROBITUSSIN DM) 100-10 MG/5ML syrup Take 10 mLs by mouth every 4 (four) hours as needed for cough. 118 mL 0   insulin detemir (LEVEMIR) 100 UNIT/ML injection Inject 0.1 mLs (10 Units total) into the skin 2 (two) times daily. 10 mL 11   insulin lispro (HUMALOG) 100 UNIT/ML injection Inject 15-24 Units into the skin 3 (three) times daily with meals. Per sliding scale. 151-200=18 units; 201-250= 9 units; 251-300=20 units; 301-350=21 units; 351-400= 22 units; 401-450=23 units; 500-551=24 units.     levothyroxine (SYNTHROID) 100 MCG tablet Take 2 tablets (200 mcg total) by mouth daily before breakfast.     linagliptin (TRADJENTA) 5 MG TABS tablet Take 5 mg by mouth daily.      lisinopril (PRINIVIL,ZESTRIL) 20 MG tablet Take 20 mg by mouth daily.     metFORMIN (GLUCOPHAGE) 500 MG tablet Take 500 mg by mouth 2 (two) times daily.     mirtazapine (REMERON) 7.5 MG tablet Take 1 tablet (7.5 mg total) by mouth at bedtime. 90 tablet 0   nicotine polacrilex (NICORETTE) 4 MG gum Take 1 each (4 mg total) by mouth as needed for smoking cessation. 100 tablet 0   NOVOLOG FLEXPEN 100 UNIT/ML FlexPen Inject 5 Units into the skin in the morning, at noon, and at bedtime. 15 mL 11   ondansetron (ZOFRAN) 4 MG tablet Take 1 tablet  (4 mg total) by mouth every 8 (eight) hours as needed for nausea or vomiting. 30 tablet 2   pantoprazole (PROTONIX) 40 MG tablet TAKE 1 TABLET BY MOUTH ONCE DAILY 30 MINTUES BEFORE BREAKFAST. (Patient taking differently: Take 40 mg by mouth daily before breakfast.) 30 tablet 11   PARoxetine (PAXIL) 40 MG tablet Take 1 tablet (40 mg total) by mouth  daily. 90 tablet 0   QUEtiapine (SEROQUEL) 100 MG tablet Take 1 tablet (100 mg total) by mouth at bedtime. 90 tablet 0   No current facility-administered medications for this visit.    REVIEW OF SYSTEMS:  [X]  denotes positive finding, [ ]  denotes negative finding Cardiac  Comments:  Chest pain or chest pressure:    Shortness of breath upon exertion:    Short of breath when lying flat:    Irregular heart rhythm:        Vascular    Pain in calf, thigh, or hip brought on by ambulation:    Pain in feet at night that wakes you up from your sleep:     Blood clot in your veins:    Leg swelling:         Pulmonary    Oxygen at home:    Productive cough:  x   Wheezing:         Neurologic    Sudden weakness in arms or legs:     Sudden numbness in arms or legs:     Sudden onset of difficulty speaking or slurred speech:    Temporary loss of vision in one eye:     Problems with dizziness:         Gastrointestinal    Blood in stool:     Vomited blood:         Genitourinary    Burning when urinating:     Blood in urine:        Psychiatric    Major depression:  x       Hematologic    Bleeding problems:    Problems with blood clotting too easily:        Skin    Rashes or ulcers:        Constitutional    Fever or chills:      PHYSICAL EXAM: Vitals:   05/20/21 1308  BP: 127/80  Pulse: 91  Temp: (!) 97.2 F (36.2 C)  SpO2: 94%  Weight: 150 lb 3.2 oz (68.1 kg)  Height: 5\' 4"  (1.626 m)    GENERAL: The patient is a well-nourished female, in no acute distress. The vital signs are documented above. CARDIOVASCULAR: 2+ radial and 2+  femoral pulses bilaterally.  Absent popliteal and distal pulses bilaterally PULMONARY: There is good air exchange  MUSCULOSKELETAL: There are no major deformities or cyanosis. NEUROLOGIC: No focal weakness or paresthesias are detected. SKIN: There are no ulcers or rashes noted.  She does have 1 small 2 to 3 mm eschar on her left second toe.  No surrounding erythema PSYCHIATRIC: The patient has a normal affect.  DATA:  Noninvasive studies reveal ankle arm index of 0.53 on the left and 0.67 on the right and she has monophasic waveforms bilaterally  MEDICAL ISSUES: Discussed the significance of her apparent SFA occlusive disease.  She is not having any rest pain and does not walk to the level of claudication.  I explained that cigarette smoking is the most probable cause for her peripheral vascular disease and the need to attempt smoking cessation.  Also with blood pressure and blood sugar control.  She has not had any progressive tissue loss and therefore would continue close observation of her feet.  Not recommend any further evaluation or follow-up unless she had progressive difficulty   Rosetta Posner, MD [facility] Special[facility] Tel [phone] Pager [phone]  Note: Portions of this report may  have been transcribed using voice recognition software.  Every effort has been made to ensure accuracy; however, inadvertent computerized transcription errors may still be present.

## 2021-05-21 ENCOUNTER — Other Ambulatory Visit: Payer: Self-pay | Admitting: *Deleted

## 2021-05-21 NOTE — Patient Outreach (Signed)
[facility]) Care Management  [place]  05/21/2021   Claudia Keller 1951-05-24 [id number]  Subjective: Case closure call.  Encounter Medications:  Outpatient Encounter Medications as of 05/21/2021  Medication Sig Note   albuterol (VENTOLIN HFA) 108 (90 Base) MCG/ACT inhaler Inhale 2 puffs into the lungs every 4 (four) hours as needed for wheezing or shortness of breath.    atorvastatin (LIPITOR) 20 MG tablet Take 1 tablet (20 mg total) by mouth at bedtime.    Calcium Carb-Cholecalciferol (OYSTER SHELL CALCIUM W/D) 500-200 MG-UNIT TABS Take 1 tablet by mouth 2 (two) times daily.    cetirizine (ZYRTEC) 10 MG tablet Take 10 mg by mouth daily.    colestipol (COLESTID) 1 g tablet TAKE 2 TABLETS DAILY FOR DIARRHEA. DO NOT TAKE WITHIN 2 HOURS OF OTHER ORAL MEDICATIONS. HOLD FOR CONSTIPATION.    diltiazem (CARDIZEM CD) 120 MG 24 hr capsule Take 1 capsule (120 mg total) by mouth daily.    divalproex (DEPAKOTE ER) 500 MG 24 hr tablet Take 2 tablets (1,000 mg total) by mouth every evening.    divalproex (DEPAKOTE) 250 MG DR tablet Take 1 tablet (250 mg total) by mouth daily.    doxycycline (VIBRAMYCIN) 100 MG capsule Take 1 capsule (100 mg total) by mouth daily. Gave patient from stock bottle lot [id number] exp 07/28/22 told her to not take with dairy stay out of sunlight    furosemide (LASIX) 20 MG tablet Take 20 mg by mouth daily.    gabapentin (NEURONTIN) 100 MG capsule Take 100 mg by mouth 3 (three) times daily.    guaiFENesin-dextromethorphan (ROBITUSSIN DM) 100-10 MG/5ML syrup Take 10 mLs by mouth every 4 (four) hours as needed for cough. 12/07/2020: Not started yet   insulin detemir (LEVEMIR) 100 UNIT/ML injection Inject 0.1 mLs (10 Units total) into the skin 2 (two) times daily.    insulin lispro (HUMALOG) 100 UNIT/ML injection Inject 15-24 Units into the skin 3 (three) times daily with meals. Per sliding scale. 151-200=18 units; 201-250= 9 units; 251-300=20 units;  301-350=21 units; 351-400= 22 units; 401-450=23 units; 500-551=24 units.    levothyroxine (SYNTHROID) 100 MCG tablet Take 2 tablets (200 mcg total) by mouth daily before breakfast.    linagliptin (TRADJENTA) 5 MG TABS tablet Take 5 mg by mouth daily.     lisinopril (PRINIVIL,ZESTRIL) 20 MG tablet Take 20 mg by mouth daily.    metFORMIN (GLUCOPHAGE) 500 MG tablet Take 500 mg by mouth 2 (two) times daily.    mirtazapine (REMERON) 7.5 MG tablet Take 1 tablet (7.5 mg total) by mouth at bedtime.    nicotine polacrilex (NICORETTE) 4 MG gum Take 1 each (4 mg total) by mouth as needed for smoking cessation.    NOVOLOG FLEXPEN 100 UNIT/ML FlexPen Inject 5 Units into the skin in the morning, at noon, and at bedtime.    ondansetron (ZOFRAN) 4 MG tablet Take 1 tablet (4 mg total) by mouth every 8 (eight) hours as needed for nausea or vomiting.    pantoprazole (PROTONIX) 40 MG tablet TAKE 1 TABLET BY MOUTH ONCE DAILY 30 MINTUES BEFORE BREAKFAST. (Patient taking differently: Take 40 mg by mouth daily before breakfast.)    PARoxetine (PAXIL) 40 MG tablet Take 1 tablet (40 mg total) by mouth daily.    QUEtiapine (SEROQUEL) 100 MG tablet Take 1 tablet (100 mg total) by mouth at bedtime.    No facility-administered encounter medications on file as of 05/21/2021.    Functional Status:  In your  present state of health, do you have any difficulty performing the following activities: 01/12/2021 12/07/2020  Hearing? N N  Vision? N N  Difficulty concentrating or making decisions? N Y  Walking or climbing stairs? Y Y  Dressing or bathing? Y N  Doing errands, shopping? [name]  Preparing Food and eating ? Y -  Using the Toilet? N -  In the past six months, have you accidently leaked urine? Y -  Do you have problems with loss of bowel control? N -  Managing your Medications? N -  Managing your Finances? N -  Housekeeping or managing your Housekeeping? N -  Some recent data might be hidden    Fall/Depression  Screening: Fall Risk  05/20/2021 01/12/2021 09/07/2016  Falls in the past year? 1 1 No  Number falls in past yr: 0 1 -  Comment 05/15/2021 - -  Injury with Fall? 0 1 -  Risk for fall due to : - History of fall(s);Impaired balance/gait;Impaired mobility -  Follow up - Falls evaluation completed -   PHQ 2/9 Scores 01/12/2021 09/15/2016 09/07/2016 08/17/2016 09/22/2015  PHQ - 2 Score 0 1 0 0 0  Some encounter information is confidential and restricted. Go to Review Flowsheets activity to see all data.    Assessment: DM - not checking glucose or taking her insulin                        Confusion  Care Plan   Goals Addressed             This Visit's Progress    COMPLETED: Set My Target A1C-Diabetes Type 2 today per ADA recommendations and document. Pt HgbA1C will be < last 10.3 on next check within the next 3 months, glucose levels WNL 25% of the time on random week review each month.       Timeframe:  Long term goal Priority:  High Start Date:     01/12/21                        Expected End Date:    05/07/21      Follow Up Date: 04/06/21  - set target A1C  (7.0)   Notes: Current A1C 10.3. Today's FBS was 427, other readings mostly in 200's , one 170 and one 60. Did have some nausea and weakness with low, knows to eat and drink something 15 Gms carbs. 02/19/21 Glucose readings improved, FBS usually under 110, NFBS are usually in the 200s HgbA1C not checked yet. Request Dr. Legrand Rams advise when done and value. Reviewed normal values goals and complications if not well controlled by diet. She is aware and will strive to improve. 04/13/21 Pt unable to give me her glucose readings. No new labs (HgbA1C in Epic chart) Left a message at [facility] that this is needed. 05/20/21 Pt has not been checking her glucose levels nor taking her insulin. She is confused today. She reports she has multiple monitors and some strips but she can't figure out which strips go with each monitor. This is out of the ordinary for her.  Advised this needs to be resolved today and her glucose needs to be checked, she agrees. Called pt son and updated on pt status, requested he check on her this evening and help her get organized and check her glucose level. He says he has not observed any confusion this is new. He agrees to do so and  we will talk again in the am. Called [facility] and advised of pt condition. Nurse, Darrelyn Hillock, advised that when they take pt to her MD appt today they will check her over at the center. NP to follow up tomorrow on pt status. 05/21/21 Ms. Wenzler reports that she saw the vascular specialist but did not see any provider from [facility]. Her son did not come over to check on her as he agreed and Ms. Omura says he instructed her to not talk to anyone that wasn't with the [facility] program. NP advised pt that there were and are safety issues and that is my obligation to take steps to ensure her safety. She reports she is going to [facility] tomorrow and they are going to evaluate her. NP to close her case.Not able to evaluate glucose readings. Suspect goal was not met.        Plan: Pt to go to [facility] tomorrow for evaluation.          Case closed due to [facility] primary care now. Follow-up: Patient requests no follow-up at this time.  Eulah Pont. Myrtie Neither, MSN, [facility] Gerontological Nurse Practitioner [facility] Care Management [phone]

## 2021-07-24 ENCOUNTER — Other Ambulatory Visit (HOSPITAL_COMMUNITY): Payer: Self-pay

## 2021-08-04 ENCOUNTER — Ambulatory Visit (INDEPENDENT_AMBULATORY_CARE_PROVIDER_SITE_OTHER): Payer: Medicare (Managed Care) | Admitting: Endocrinology

## 2021-08-04 ENCOUNTER — Other Ambulatory Visit: Payer: Self-pay

## 2021-08-04 VITALS — BP 170/60 | HR 77 | Ht 64.0 in | Wt 148.2 lb

## 2021-08-04 DIAGNOSIS — E1165 Type 2 diabetes mellitus with hyperglycemia: Secondary | ICD-10-CM | POA: Diagnosis not present

## 2021-08-04 DIAGNOSIS — E1122 Type 2 diabetes mellitus with diabetic chronic kidney disease: Secondary | ICD-10-CM | POA: Diagnosis not present

## 2021-08-04 DIAGNOSIS — IMO0002 Reserved for concepts with insufficient information to code with codable children: Secondary | ICD-10-CM

## 2021-08-04 DIAGNOSIS — N182 Chronic kidney disease, stage 2 (mild): Secondary | ICD-10-CM

## 2021-08-04 LAB — POCT GLYCOSYLATED HEMOGLOBIN (HGB A1C): Hemoglobin A1C: 8.8 % — AB (ref 4.0–5.6)

## 2021-08-04 MED ORDER — NOVOLOG FLEXPEN 100 UNIT/ML ~~LOC~~ SOPN: 15.0000 [IU] | PEN_INJECTOR | Freq: Three times a day (TID) | SUBCUTANEOUS | 3 refills | Status: DC

## 2021-08-04 MED ORDER — LEVEMIR FLEXTOUCH 100 UNIT/ML ~~LOC~~ SOPN: 35.0000 [IU] | PEN_INJECTOR | SUBCUTANEOUS | 3 refills | Status: DC

## 2021-08-04 NOTE — Patient Instructions (Addendum)
good diet and exercise significantly improve the control of your diabetes.  please let me know if you wish to be referred to a dietician.  high blood sugar is very risky to your health.  you should see an eye doctor and dentist every year.  It is very important to get all recommended vaccinations.  Controlling your blood pressure and cholesterol drastically reduces the damage diabetes does to your body.  Those who smoke should quit.  Please discuss these with your doctor.  check your blood sugar twice a day.  vary the time of day when you check, between before the 3 meals, and at bedtime.  also check if you have symptoms of your blood sugar being too high or too low.  please keep a record of the readings and bring it to your next appointment here (or you can bring the meter itself).  You can write it on any piece of paper.  please call us sooner if your blood sugar goes below 70, or if most of your readings are over 200.   We will need to take this complex situation in stages. Please stop taking the Tradjenta and metformin, and: Reduce the Novolog to approx 15 units 3 times a day (just before each meal), and: Increase the Levemir to 35 units each morning.   Please come back for a follow-up appointment in approx 2 weeks.

## 2021-08-04 NOTE — Progress Notes (Signed)
Subjective:    Patient ID: Claudia Keller, female    DOB: 1951/07/01, 70 y.o.   MRN: [id number]  HPI pt is referred by Dr Mina Marble, for diabetes.  Pt states DM was dx'ed in 1700; it is complicated by PN and stage 3 CRI; she has been on insulin since 2018; pt says her diet is good, but exercise is limited by health problems; she has never had GDM, pancreatitis, pancreatic surgery, or severe hypoglycemia.  She has had DKA once (2018).  She takes Levemir 10 units BID, SS-novolog (averages a total of approx 60 units/d--confirmed by readback--she says 1 Novolog pen lasts just 1 week), and 2 oral meds.  She has mild hypoglycemia approx once/week.  Pt says cbg varies from 90-150.  It is in general lowest fasting.  Pt gives her own insulin, and checks her own cbg.  She lives alone.   Past Medical History:  Diagnosis Date   Arthritis    COPD (chronic obstructive pulmonary disease) ([place])    Diverticulosis    Essential hypertension, benign    Hx of colonic polyps    Hypothyroidism    IBS (irritable bowel syndrome)    Irritable bowel syndrome    Ketoacidosis    Mixed hyperlipidemia    PTSD (post-traumatic stress disorder)    S/P colonoscopy Jan 2011   RMR: pancolonic diverticula, polypoid rectal mucosa with  prominent lymphoid aggregates, repeat in Jan 2016    Shortness of breath dyspnea    Stress incontinence, female    Thyroid disease    Tobacco use    Type 2 diabetes mellitus ([place])     Past Surgical History:  Procedure Laterality Date   Bilateral tubal ligation     BREAST BIOPSY Left 10/16/2014   negative   CHOLECYSTECTOMY  09/11/2012   Procedure: LAPAROSCOPIC CHOLECYSTECTOMY;  Surgeon: Jamesetta So, MD;  Location: [facility];  Service: General;  Laterality: N/A;   COLONOSCOPY  11/17/2009   Dr. Gala Romney: normal rectum, pancolonic diverticula, polyps benign, no adenomas.    COLONOSCOPY N/A 02/12/2015   Dr. Gala Romney: colonic diverticulosis, multiple colonic polyps (tubular adenomas), negative segmental  biopsies. Surveillance in 2021   ESOPHAGOGASTRODUODENOSCOPY  11/23/2011   Dr. Gala Romney: Erosive reflux esophagitis; small hiatal hernia; esophagus dilated empirically   MALONEY DILATION  11/23/2011   Procedure: Venia Minks DILATION;  Surgeon: Daneil Dolin, MD;  Location: [facility];  Service: Endoscopy;  Laterality: N/A;   SKIN LESION EXCISION     on buttocks   TUBAL LIGATION      Social History   Socioeconomic History   Marital status: Legally Separated    Spouse name: Not on file   Number of children: Not on file   Years of education: Not on file   Highest education level: Not on file  Occupational History   Not on file  Tobacco Use   Smoking status: Every Day    Packs/day: 2.00    Years: 43.00    Pack years: 86.00    Types: Cigarettes   Smokeless tobacco: Never  Substance and Sexual Activity   Alcohol use: No    Alcohol/week: 0.0 standard drinks   Drug use: No   Sexual activity: Not Currently    Birth control/protection: Surgical, Post-menopausal    Comment: tubal  Other Topics Concern   Not on file  Social History Narrative   Not on file   Social Determinants of Health   Financial Resource Strain: Not on file  Food Insecurity: No  Food Insecurity   Worried About Charity fundraiser in the Last Year: Never true   Ran Out of Food in the Last Year: Never true  Transportation Needs: No Transportation Needs   Lack of Transportation (Medical): No   Lack of Transportation (Non-Medical): No  Physical Activity: Not on file  Stress: Not on file  Social Connections: Not on file  Intimate Partner Violence: Not on file    Current Outpatient Medications on File Prior to Visit  Medication Sig Dispense Refill   albuterol (VENTOLIN HFA) 108 (90 Base) MCG/ACT inhaler Inhale 2 puffs into the lungs every 4 (four) hours as needed for wheezing or shortness of breath. 8 g 1   atorvastatin (LIPITOR) 20 MG tablet Take 1 tablet (20 mg total) by mouth at bedtime.     Calcium  Carb-Cholecalciferol (OYSTER SHELL CALCIUM W/D) 500-200 MG-UNIT TABS Take 1 tablet by mouth 2 (two) times daily.     cetirizine (ZYRTEC) 10 MG tablet Take 10 mg by mouth daily.     colestipol (COLESTID) 1 g tablet TAKE 2 TABLETS DAILY FOR DIARRHEA. DO NOT TAKE WITHIN 2 HOURS OF OTHER ORAL MEDICATIONS. HOLD FOR CONSTIPATION. 60 tablet 11   diltiazem (CARDIZEM CD) 120 MG 24 hr capsule Take 1 capsule (120 mg total) by mouth daily. 30 capsule 11   divalproex (DEPAKOTE ER) 500 MG 24 hr tablet Take 2 tablets (1,000 mg total) by mouth every evening. 180 tablet 0   divalproex (DEPAKOTE) 250 MG DR tablet Take 1 tablet (250 mg total) by mouth daily. 90 tablet 0   doxycycline (VIBRAMYCIN) 100 MG capsule Take 1 capsule (100 mg total) by mouth daily. Gave patient from stock bottle lot [id number] exp 07/28/22 told her to not take with dairy stay out of sunlight 7 capsule 0   furosemide (LASIX) 20 MG tablet Take 20 mg by mouth daily.     gabapentin (NEURONTIN) 100 MG capsule Take 100 mg by mouth 3 (three) times daily.     guaiFENesin-dextromethorphan (ROBITUSSIN DM) 100-10 MG/5ML syrup Take 10 mLs by mouth every 4 (four) hours as needed for cough. 118 mL 0   levothyroxine (SYNTHROID) 100 MCG tablet Take 2 tablets (200 mcg total) by mouth daily before breakfast.     lisinopril (PRINIVIL,ZESTRIL) 20 MG tablet Take 20 mg by mouth daily.     mirtazapine (REMERON) 7.5 MG tablet Take 1 tablet (7.5 mg total) by mouth at bedtime. 90 tablet 0   nicotine polacrilex (NICORETTE) 4 MG gum Take 1 each (4 mg total) by mouth as needed for smoking cessation. 100 tablet 0   ondansetron (ZOFRAN) 4 MG tablet Take 1 tablet (4 mg total) by mouth every 8 (eight) hours as needed for nausea or vomiting. 30 tablet 2   pantoprazole (PROTONIX) 40 MG tablet TAKE 1 TABLET BY MOUTH ONCE DAILY 30 MINTUES BEFORE BREAKFAST. (Patient taking differently: Take 40 mg by mouth daily before breakfast.) 30 tablet 11   PARoxetine (PAXIL) 40 MG tablet Take 1  tablet (40 mg total) by mouth daily. 90 tablet 0   QUEtiapine (SEROQUEL) 100 MG tablet Take 1 tablet (100 mg total) by mouth at bedtime. 90 tablet 0   No current facility-administered medications on file prior to visit.    No Known Allergies  Family History  Problem Relation Age of Onset   Schizophrenia Brother    Stroke Brother    Cancer Mother        unsure what type   Heart disease Father  Heart attack Father    Coronary artery disease Other    Diabetes type II Other    Cancer Son        bladder   Colon cancer Neg Hx     BP (!) 170/60 (BP Location: Right Arm, Patient Position: Sitting, Cuff Size: Normal)   Pulse 77   Ht 5\' 4"  (1.626 m)   Wt 148 lb 3.2 oz (67.2 kg)   SpO2 94%   BMI 25.44 kg/m    Review of Systems denies weight loss, sob, n/v, memory loss, and depression.     Objective:   Physical Exam Pulses: dorsalis pedis intact bilat.   MSK: no deformity of the feet CV: 1+ bilat leg edema Skin:  no ulcer on the feet.  normal color and temp on the feet. Neuro: sensation is intact to touch on the feet, but decreased from normal GAIT: steady, with a walker.  I have reviewed outside records, and summarized: Pt was noted to have elevated A1c, and referred here.   In 2018, she was on a plane from  to [place].  The flight was diverted to [facility] for N/V.  Pt was found to be in DKA.    A1c=8.8%    Assessment & Plan:  Type 1 DM: uncontrolled.  We discussed rx options.  She wold like to phase out multiple daily injections   Patient Instructions  good diet and exercise significantly improve the control of your diabetes.  please let me know if you wish to be referred to a dietician.  high blood sugar is very risky to your health.  you should see an eye doctor and dentist every year.  It is very important to get all recommended vaccinations.  Controlling your blood pressure and cholesterol drastically reduces the damage diabetes does to your body.  Those who smoke should  quit.  Please discuss these with your doctor.  check your blood sugar twice a day.  vary the time of day when you check, between before the 3 meals, and at bedtime.  also check if you have symptoms of your blood sugar being too high or too low.  please keep a record of the readings and bring it to your next appointment here (or you can bring the meter itself).  You can write it on any piece of paper.  please call us sooner if your blood sugar goes below 70, or if most of your readings are over 200.   We will need to take this complex situation in stages. Please stop taking the Tradjenta and metformin, and: Reduce the Novolog to approx 15 units 3 times a day (just before each meal), and: Increase the Levemir to 35 units each morning.   Please come back for a follow-up appointment in approx 2 weeks.

## 2021-08-10 ENCOUNTER — Telehealth: Payer: Self-pay | Admitting: Endocrinology

## 2021-08-10 NOTE — Telephone Encounter (Signed)
Pt has [place] bs machine and needs more test strips. [place], [place] Pt contact [phone]

## 2021-08-12 MED ORDER — QUINTET BLOOD GLUCOSE TEST VI STRP: ORAL_STRIP | 2 refills | Status: DC

## 2021-08-12 NOTE — Telephone Encounter (Signed)
Strips have been sent and patient notified

## 2021-08-21 ENCOUNTER — Telehealth: Payer: Self-pay | Admitting: Endocrinology

## 2021-08-21 NOTE — Telephone Encounter (Signed)
Spoke with sone but he has no clue to the answers Claudia Keller is looking for so I told him that I will send it thru Mychart and he can ask his mother and send [place] a message back.

## 2021-08-21 NOTE — Telephone Encounter (Signed)
Called to get information from patient. Unable to get answer. Informed to give [place] a callback

## 2021-08-21 NOTE — Telephone Encounter (Signed)
Patient requests to be called at ph# [phone] re: 08/20/21 in the morning Patient was shaking-blood sugars were 55. EMT's went to Patient's house and brought Patient's blood sugar to 88. Patient last checked her blood sugars at 9:30 am 08/21/21-blood sugar=195. Patient states [facility] told her to call Dr. Loanne Drilling.

## 2021-08-27 ENCOUNTER — Ambulatory Visit (INDEPENDENT_AMBULATORY_CARE_PROVIDER_SITE_OTHER): Payer: Medicare (Managed Care) | Admitting: Endocrinology

## 2021-08-27 ENCOUNTER — Other Ambulatory Visit: Payer: Self-pay

## 2021-08-27 VITALS — BP 160/62 | HR 95 | Ht 64.0 in | Wt 155.0 lb

## 2021-08-27 DIAGNOSIS — E1165 Type 2 diabetes mellitus with hyperglycemia: Secondary | ICD-10-CM | POA: Diagnosis not present

## 2021-08-27 LAB — POCT GLYCOSYLATED HEMOGLOBIN (HGB A1C): Hemoglobin A1C: 8.6 % — AB (ref 4.0–5.6)

## 2021-08-27 MED ORDER — FREESTYLE LIBRE 2 SENSOR MISC: 1.0000 | 3 refills | Status: DC

## 2021-08-27 MED ORDER — FREESTYLE LIBRE 2 READER DEVI: 1.0000 | Freq: Once | 1 refills | Status: AC

## 2021-08-27 MED ORDER — FREESTYLE LIBRE 2 READER DEVI: 1.0000 | Freq: Once | 1 refills | Status: DC

## 2021-08-27 MED ORDER — NOVOLIN N FLEXPEN 100 UNIT/ML ~~LOC~~ SUPN: 60.0000 [IU] | PEN_INJECTOR | SUBCUTANEOUS | 3 refills | Status: DC

## 2021-08-27 NOTE — Patient Instructions (Addendum)
check your blood sugar twice a day.  vary the time of day when you check, between before the 3 meals, and at bedtime.  also check if you have symptoms of your blood sugar being too high or too low.  please keep a record of the readings and bring it to your next appointment here (or you can bring the meter itself).  You can write it on any piece of paper.  please call us sooner if your blood sugar goes below 70, or if most of your readings are over 200.   We will need to take this complex situation in stages. Please change both current insulins to NPH, 60 units each morning. On this type of insulin schedule, you should eat meals on a regular schedule.  If a meal is missed or significantly delayed, your blood sugar could go low.    Also, I have sent a prescription to your pharmacy, for the continuous glucose monitor, to see if it is covered.   Please come back for a follow-up appointment in approx 2 weeks.

## 2021-08-27 NOTE — Progress Notes (Signed)
Subjective:    Patient ID: Claudia Keller, female    DOB: [birth year], 69 y.o.   MRN: [id number]  HPI Pt returns for f/u of diabetes mellitus: DM type: 1 Dx'ed: 2993 Complications: PN and stage 3 CRI Therapy: insulin since 2018 GDM: never DKA: 2018 Severe hypoglycemia: never Pancreatitis: never Pancreatic imaging: normal on 2021 CT SDOH: she gets insulins from [facility] Other: she is changing to qd insulin, after poor results with multiple daily injections Interval history: no cbg record, but states cbg's vary from 55-300.  It is in general highest in the afternoon, and lowest in the middle of the night.  She never misses the insulin.   Past Medical History:  Diagnosis Date   Arthritis    COPD (chronic obstructive pulmonary disease) ([place])    Diverticulosis    Essential hypertension, benign    Hx of colonic polyps    Hypothyroidism    IBS (irritable bowel syndrome)    Irritable bowel syndrome    Ketoacidosis    Mixed hyperlipidemia    PTSD (post-traumatic stress disorder)    S/P colonoscopy Jan 2011   RMR: pancolonic diverticula, polypoid rectal mucosa with  prominent lymphoid aggregates, repeat in Jan 2016    Shortness of breath dyspnea    Stress incontinence, female    Thyroid disease    Tobacco use    Type 2 diabetes mellitus ([place])     Past Surgical History:  Procedure Laterality Date   Bilateral tubal ligation     BREAST BIOPSY Left 10/16/2014   negative   CHOLECYSTECTOMY  09/11/2012   Procedure: LAPAROSCOPIC CHOLECYSTECTOMY;  Surgeon: Jamesetta So, MD;  Location: [facility];  Service: General;  Laterality: N/A;   COLONOSCOPY  11/17/2009   Dr. Gala Romney: normal rectum, pancolonic diverticula, polyps benign, no adenomas.    COLONOSCOPY N/A 02/12/2015   Dr. Gala Romney: colonic diverticulosis, multiple colonic polyps (tubular adenomas), negative segmental biopsies. Surveillance in 2021   ESOPHAGOGASTRODUODENOSCOPY  11/23/2011   Dr. Gala Romney: Erosive reflux esophagitis; small hiatal  hernia; esophagus dilated empirically   MALONEY DILATION  11/23/2011   Procedure: Venia Minks DILATION;  Surgeon: Daneil Dolin, MD;  [facility];  Service: Endoscopy;  Laterality: N/A;   SKIN LESION EXCISION     on buttocks   TUBAL LIGATION      Social History   Socioeconomic History   Marital status: Legally Separated    Spouse name: Not on file   Number of children: Not on file   Years of education: Not on file   Highest education level: Not on file  Occupational History   Not on file  Tobacco Use   Smoking status: Every Day    Packs/day: 2.00    Years: 43.00    Pack years: 86.00    Types: Cigarettes   Smokeless tobacco: Never  Substance and Sexual Activity   Alcohol use: No    Alcohol/week: 0.0 standard drinks   Drug use: No   Sexual activity: Not Currently    Birth control/protection: Surgical, Post-menopausal    Comment: tubal  Other Topics Concern   Not on file  Social History Narrative   Not on file   Social Determinants of Health   Financial Resource Strain: Not on file  Food Insecurity: No Food Insecurity   Worried About Running Out of Food in the Last Year: Never true   Ran Out of Food in the Last Year: Never true  Transportation Needs: No Transportation Needs   Lack  of Transportation (Medical): No   Lack of Transportation (Non-Medical): No  Physical Activity: Not on file  Stress: Not on file  Social Connections: Not on file  Intimate Partner Violence: Not on file    Current Outpatient Medications on File Prior to Visit  Medication Sig Dispense Refill   albuterol (VENTOLIN HFA) 108 (90 Base) MCG/ACT inhaler Inhale 2 puffs into the lungs every 4 (four) hours as needed for wheezing or shortness of breath. 8 g 1   atorvastatin (LIPITOR) 20 MG tablet Take 1 tablet (20 mg total) by mouth at bedtime.     Calcium Carb-Cholecalciferol (OYSTER SHELL CALCIUM W/D) 500-200 MG-UNIT TABS Take 1 tablet by mouth 2 (two) times daily.     cetirizine (ZYRTEC)  10 MG tablet Take 10 mg by mouth daily.     colestipol (COLESTID) 1 g tablet TAKE 2 TABLETS DAILY FOR DIARRHEA. DO NOT TAKE WITHIN 2 HOURS OF OTHER ORAL MEDICATIONS. HOLD FOR CONSTIPATION. 60 tablet 11   diltiazem (CARDIZEM CD) 120 MG 24 hr capsule Take 1 capsule (120 mg total) by mouth daily. 30 capsule 11   divalproex (DEPAKOTE ER) 500 MG 24 hr tablet Take 2 tablets (1,000 mg total) by mouth every evening. 180 tablet 0   divalproex (DEPAKOTE) 250 MG DR tablet Take 1 tablet (250 mg total) by mouth daily. 90 tablet 0   furosemide (LASIX) 20 MG tablet Take 20 mg by mouth daily.     gabapentin (NEURONTIN) 100 MG capsule Take 100 mg by mouth 3 (three) times daily.     glucose blood (QUINTET BLOOD GLUCOSE TEST) test strip Check blood sugar 2 times daily E11.65 100 each 2   guaiFENesin-dextromethorphan (ROBITUSSIN DM) 100-10 MG/5ML syrup Take 10 mLs by mouth every 4 (four) hours as needed for cough. 118 mL 0   levothyroxine (SYNTHROID) 100 MCG tablet Take 2 tablets (200 mcg total) by mouth daily before breakfast.     lisinopril (PRINIVIL,ZESTRIL) 20 MG tablet Take 20 mg by mouth daily.     mirtazapine (REMERON) 7.5 MG tablet Take 1 tablet (7.5 mg total) by mouth at bedtime. 90 tablet 0   nicotine polacrilex (NICORETTE) 4 MG gum Take 1 each (4 mg total) by mouth as needed for smoking cessation. 100 tablet 0   ondansetron (ZOFRAN) 4 MG tablet Take 1 tablet (4 mg total) by mouth every 8 (eight) hours as needed for nausea or vomiting. 30 tablet 2   pantoprazole (PROTONIX) 40 MG tablet TAKE 1 TABLET BY MOUTH ONCE DAILY 30 MINTUES BEFORE BREAKFAST. (Patient taking differently: Take 40 mg by mouth daily before breakfast.) 30 tablet 11   PARoxetine (PAXIL) 40 MG tablet Take 1 tablet (40 mg total) by mouth daily. 90 tablet 0   QUEtiapine (SEROQUEL) 100 MG tablet Take 1 tablet (100 mg total) by mouth at bedtime. 90 tablet 0   No current facility-administered medications on file prior to visit.    No Known  Allergies  Family History  Problem Relation Age of Onset   Schizophrenia Brother    Stroke Brother    Cancer Mother        unsure what type   Heart disease Father    Heart attack Father    Coronary artery disease Other    Diabetes type II Other    Cancer Son        bladder   Colon cancer Neg Hx     BP (!) 160/62 (BP Location: Right Arm, Patient Position: Sitting, Cuff Size: Normal)  Pulse 95   Ht 5\' 4"  (1.626 m)   Wt 155 lb (70.3 kg)   SpO2 95%   BMI 26.61 kg/m    Review of Systems     Objective:   Physical Exam    Lab Results  Component Value Date   HGBA1C 8.6 (A) 08/27/2021      Assessment & Plan:  Type 1 DM: uncontrolled  Patient Instructions  check your blood sugar twice a day.  vary the time of day when you check, between before the 3 meals, and at bedtime.  also check if you have symptoms of your blood sugar being too high or too low.  please keep a record of the readings and bring it to your next appointment here (or you can bring the meter itself).  You can write it on any piece of paper.  please call us sooner if your blood sugar goes below 70, or if most of your readings are over 200.   We will need to take this complex situation in stages. Please change both current insulins to NPH, 60 units each morning. On this type of insulin schedule, you should eat meals on a regular schedule.  If a meal is missed or significantly delayed, your blood sugar could go low.    Also, I have sent a prescription to your pharmacy, for the continuous glucose monitor, to see if it is covered.   Please come back for a follow-up appointment in approx 2 weeks.

## 2021-09-02 ENCOUNTER — Other Ambulatory Visit: Payer: Self-pay

## 2021-09-02 ENCOUNTER — Emergency Department (HOSPITAL_COMMUNITY): Payer: Medicare (Managed Care)

## 2021-09-02 ENCOUNTER — Encounter (HOSPITAL_COMMUNITY): Payer: Self-pay

## 2021-09-02 ENCOUNTER — Inpatient Hospital Stay (HOSPITAL_COMMUNITY)
Admission: EM | Admit: 2021-09-02 | Discharge: 2021-09-04 | DRG: 065 | Disposition: A | Discharge status: Skilled Nursing Facility | Payer: Medicare (Managed Care) | Attending: Family Medicine | Admitting: Family Medicine

## 2021-09-02 DIAGNOSIS — I6340 Cerebral infarction due to embolism of unspecified cerebral artery: Secondary | ICD-10-CM | POA: Diagnosis not present

## 2021-09-02 DIAGNOSIS — E1122 Type 2 diabetes mellitus with diabetic chronic kidney disease: Secondary | ICD-10-CM | POA: Diagnosis present

## 2021-09-02 DIAGNOSIS — G839 Paralytic syndrome, unspecified: Principal | ICD-10-CM

## 2021-09-02 DIAGNOSIS — Z823 Family history of stroke: Secondary | ICD-10-CM

## 2021-09-02 DIAGNOSIS — Z8719 Personal history of other diseases of the digestive system: Secondary | ICD-10-CM

## 2021-09-02 DIAGNOSIS — I10 Essential (primary) hypertension: Secondary | ICD-10-CM | POA: Diagnosis present

## 2021-09-02 DIAGNOSIS — I129 Hypertensive chronic kidney disease with stage 1 through stage 4 chronic kidney disease, or unspecified chronic kidney disease: Secondary | ICD-10-CM | POA: Diagnosis present

## 2021-09-02 DIAGNOSIS — Z833 Family history of diabetes mellitus: Secondary | ICD-10-CM

## 2021-09-02 DIAGNOSIS — Z8673 Personal history of transient ischemic attack (TIA), and cerebral infarction without residual deficits: Secondary | ICD-10-CM | POA: Diagnosis present

## 2021-09-02 DIAGNOSIS — I639 Cerebral infarction, unspecified: Secondary | ICD-10-CM | POA: Diagnosis present

## 2021-09-02 DIAGNOSIS — E538 Deficiency of other specified B group vitamins: Secondary | ICD-10-CM | POA: Diagnosis present

## 2021-09-02 DIAGNOSIS — Z794 Long term (current) use of insulin: Secondary | ICD-10-CM

## 2021-09-02 DIAGNOSIS — F319 Bipolar disorder, unspecified: Secondary | ICD-10-CM | POA: Diagnosis present

## 2021-09-02 DIAGNOSIS — R29810 Facial weakness: Secondary | ICD-10-CM | POA: Diagnosis present

## 2021-09-02 DIAGNOSIS — Z8616 Personal history of COVID-19: Secondary | ICD-10-CM

## 2021-09-02 DIAGNOSIS — K589 Irritable bowel syndrome without diarrhea: Secondary | ICD-10-CM | POA: Diagnosis present

## 2021-09-02 DIAGNOSIS — I4891 Unspecified atrial fibrillation: Secondary | ICD-10-CM | POA: Diagnosis present

## 2021-09-02 DIAGNOSIS — F0153 Vascular dementia, unspecified severity, with mood disturbance: Secondary | ICD-10-CM | POA: Diagnosis present

## 2021-09-02 DIAGNOSIS — E1140 Type 2 diabetes mellitus with diabetic neuropathy, unspecified: Secondary | ICD-10-CM | POA: Diagnosis present

## 2021-09-02 DIAGNOSIS — N182 Chronic kidney disease, stage 2 (mild): Secondary | ICD-10-CM | POA: Diagnosis present

## 2021-09-02 DIAGNOSIS — E782 Mixed hyperlipidemia: Secondary | ICD-10-CM | POA: Diagnosis present

## 2021-09-02 DIAGNOSIS — E039 Hypothyroidism, unspecified: Secondary | ICD-10-CM | POA: Diagnosis present

## 2021-09-02 DIAGNOSIS — J449 Chronic obstructive pulmonary disease, unspecified: Secondary | ICD-10-CM | POA: Diagnosis present

## 2021-09-02 DIAGNOSIS — N183 Chronic kidney disease, stage 3 unspecified: Secondary | ICD-10-CM | POA: Diagnosis present

## 2021-09-02 DIAGNOSIS — E119 Type 2 diabetes mellitus without complications: Secondary | ICD-10-CM | POA: Diagnosis present

## 2021-09-02 DIAGNOSIS — G9349 Other encephalopathy: Secondary | ICD-10-CM | POA: Diagnosis present

## 2021-09-02 DIAGNOSIS — Z79899 Other long term (current) drug therapy: Secondary | ICD-10-CM

## 2021-09-02 DIAGNOSIS — R471 Dysarthria and anarthria: Secondary | ICD-10-CM | POA: Diagnosis present

## 2021-09-02 DIAGNOSIS — F17210 Nicotine dependence, cigarettes, uncomplicated: Secondary | ICD-10-CM | POA: Diagnosis present

## 2021-09-02 DIAGNOSIS — Z79890 Hormone replacement therapy (postmenopausal): Secondary | ICD-10-CM

## 2021-09-02 DIAGNOSIS — Z8249 Family history of ischemic heart disease and other diseases of the circulatory system: Secondary | ICD-10-CM

## 2021-09-02 LAB — CBC
HCT: 39.9 % (ref 36.0–46.0)
Hemoglobin: 13.8 g/dL (ref 12.0–15.0)
MCH: 31.7 pg (ref 26.0–34.0)
MCHC: 34.6 g/dL (ref 30.0–36.0)
MCV: 91.5 fL (ref 80.0–100.0)
Platelets: 201 10*3/uL (ref 150–400)
RBC: 4.36 MIL/uL (ref 3.87–5.11)
RDW: 12.9 % (ref 11.5–15.5)
WBC: 9.4 10*3/uL (ref 4.0–10.5)
nRBC: 0 % (ref 0.0–0.2)

## 2021-09-02 LAB — RESP PANEL BY RT-PCR (FLU A&B, COVID) ARPGX2
Influenza A by PCR: NEGATIVE
Influenza B by PCR: NEGATIVE
SARS Coronavirus 2 by RT PCR: NEGATIVE

## 2021-09-02 LAB — DIFFERENTIAL
Abs Immature Granulocytes: 0.06 10*3/uL (ref 0.00–0.07)
Basophils Absolute: 0.1 10*3/uL (ref 0.0–0.1)
Basophils Relative: 1 %
Eosinophils Absolute: 0.1 10*3/uL (ref 0.0–0.5)
Eosinophils Relative: 1 %
Immature Granulocytes: 1 %
Lymphocytes Relative: 28 %
Lymphs Abs: 2.6 10*3/uL (ref 0.7–4.0)
Monocytes Absolute: 0.8 10*3/uL (ref 0.1–1.0)
Monocytes Relative: 8 %
Neutro Abs: 5.9 10*3/uL (ref 1.7–7.7)
Neutrophils Relative %: 61 %

## 2021-09-02 LAB — COMPREHENSIVE METABOLIC PANEL
ALT: 17 U/L (ref 0–44)
AST: 18 U/L (ref 15–41)
Albumin: 3.5 g/dL (ref 3.5–5.0)
Alkaline Phosphatase: 47 U/L (ref 38–126)
Anion gap: 11 (ref 5–15)
BUN: 16 mg/dL (ref 8–23)
CO2: 23 mmol/L (ref 22–32)
Calcium: 9.7 mg/dL (ref 8.9–10.3)
Chloride: 98 mmol/L (ref 98–111)
Creatinine, Ser: 1.2 mg/dL — ABNORMAL HIGH (ref 0.44–1.00)
GFR, Estimated: 49 mL/min — ABNORMAL LOW (ref >60)
Glucose, Bld: 164 mg/dL — ABNORMAL HIGH (ref 70–99)
Potassium: 4.3 mmol/L (ref 3.5–5.1)
Sodium: 132 mmol/L — ABNORMAL LOW (ref 135–145)
Total Bilirubin: 0.5 mg/dL (ref 0.3–1.2)
Total Protein: 6.1 g/dL — ABNORMAL LOW (ref 6.5–8.1)

## 2021-09-02 LAB — APTT: aPTT: 29 seconds (ref 24–36)

## 2021-09-02 LAB — CBG MONITORING, ED
Glucose-Capillary: 171 mg/dL — ABNORMAL HIGH (ref 70–99)
Glucose-Capillary: 146 mg/dL — ABNORMAL HIGH (ref 70–99)

## 2021-09-02 LAB — PROTIME-INR
INR: 0.9 (ref 0.8–1.2)
Prothrombin Time: 12.1 seconds (ref 11.4–15.2)

## 2021-09-02 LAB — ETHANOL: Alcohol, Ethyl (B): <10 mg/dL (ref <10)

## 2021-09-02 LAB — BRAIN NATRIURETIC PEPTIDE: B Natriuretic Peptide: 203 pg/mL — ABNORMAL HIGH (ref 0.0–100.0)

## 2021-09-02 IMAGING — CT CT HEAD CODE STROKE
3 series · 15 of 47 positions shown, 18 images · non-contrast
Comparison: None.

CLINICAL DATA: Code stroke.  Slurred speech, left facial droop

EXAM:
CT HEAD WITHOUT CONTRAST
TECHNIQUE: Contiguous axial images were obtained from the base of the skull
through the vertex without intravenous contrast.

[Series 2: head w o · axial · 0.47mm/px · z∈[+10,+150]mm · 9 of 34 slices shown, 12 images]
[im 3/34  brain]
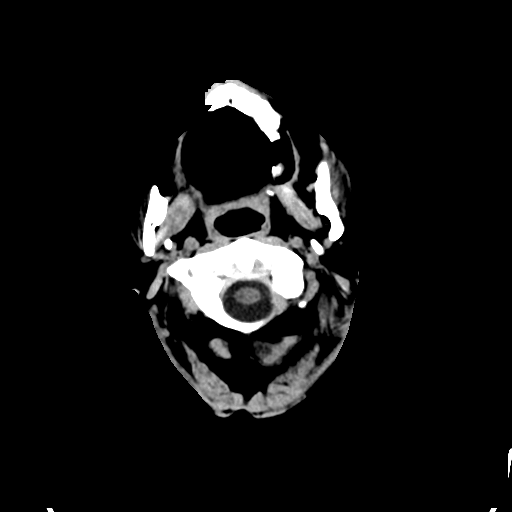
[im 3/34  bone]
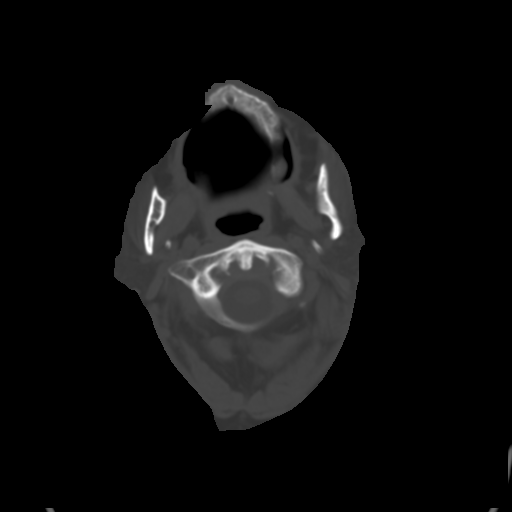
[im 6/34  brain]
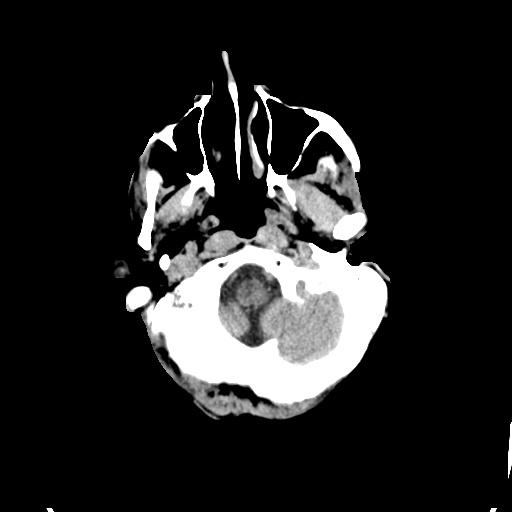
[im 10/34  brain]
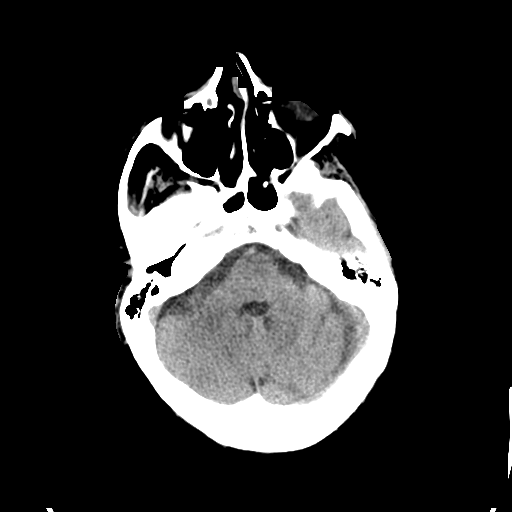
[im 13/34  brain]
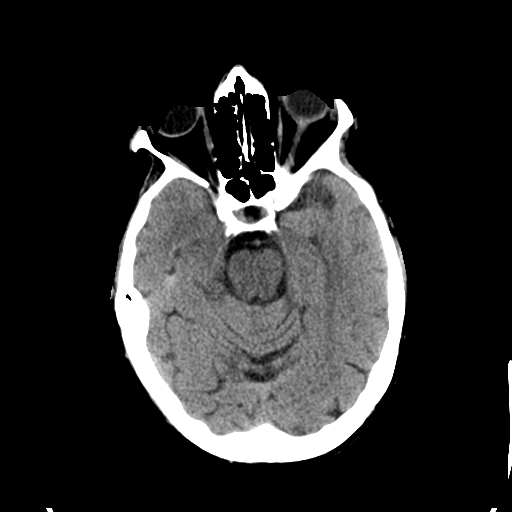
[im 18/34  brain]
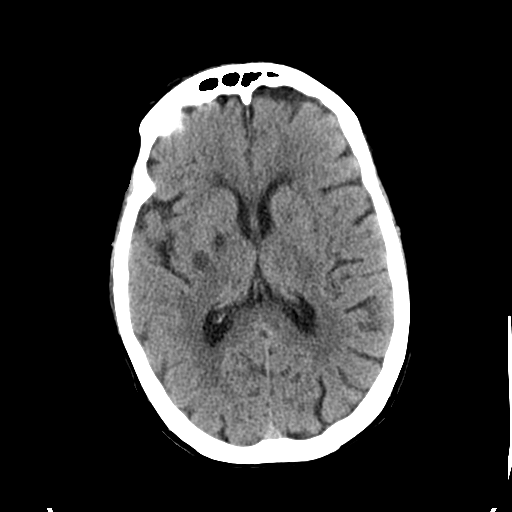
[im 18/34  bone]
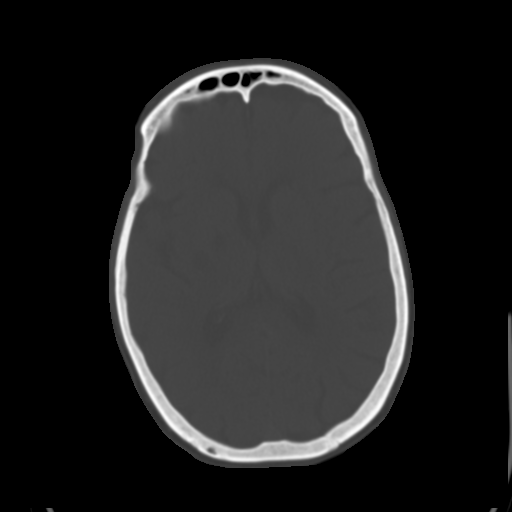
[im 21/34  brain]
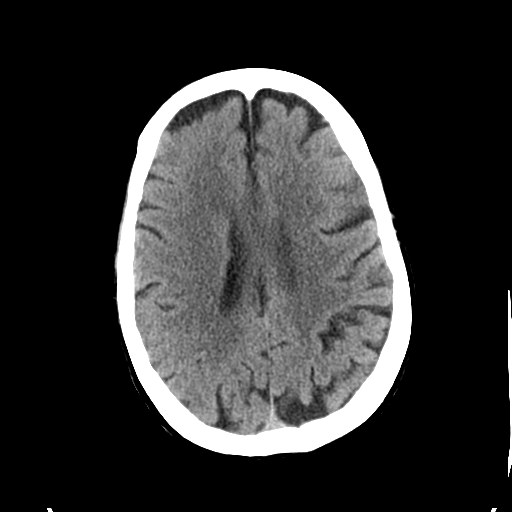
[im 24/34  brain]
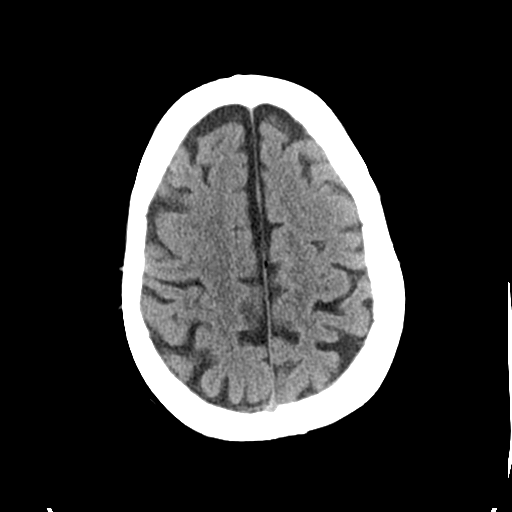
[im 28/34  brain]
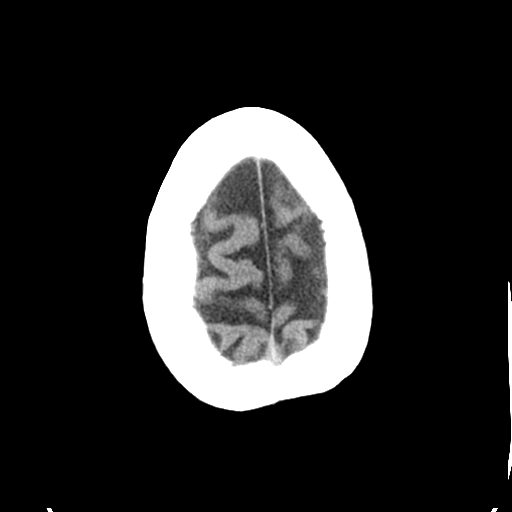
[im 31/34  brain]
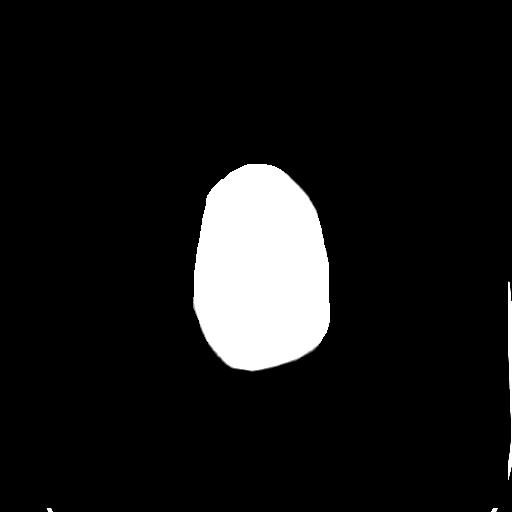
[im 31/34  bone]
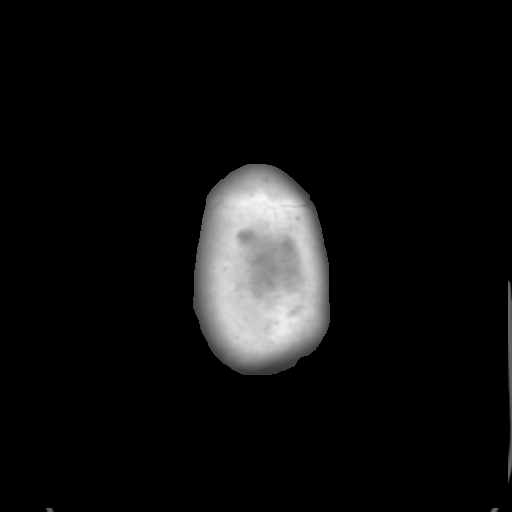

[Series 4: coronal soft · coronal · 0.34mm/px · 3 of 67 slices shown]
[im 23/67  brain]
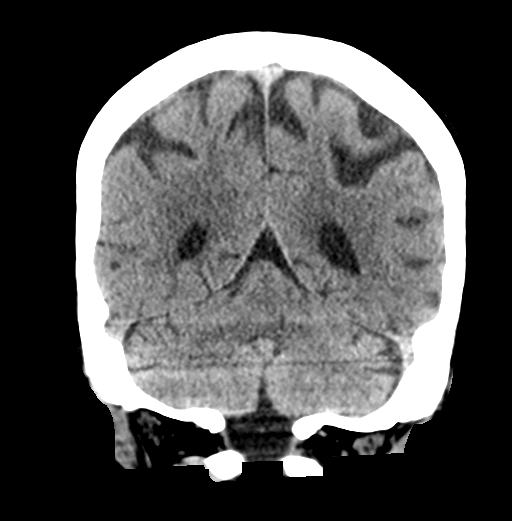
[im 30/67  brain]
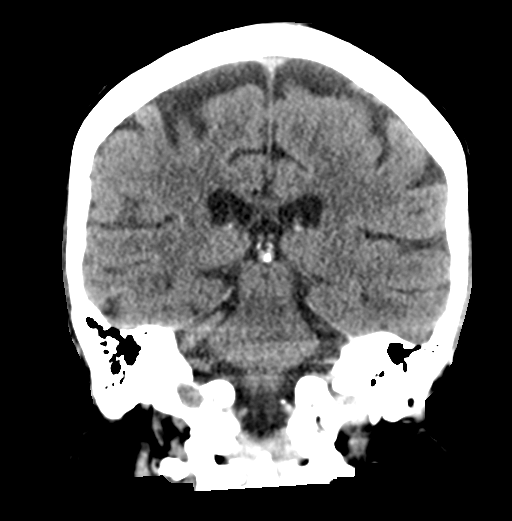
[im 37/67  brain]
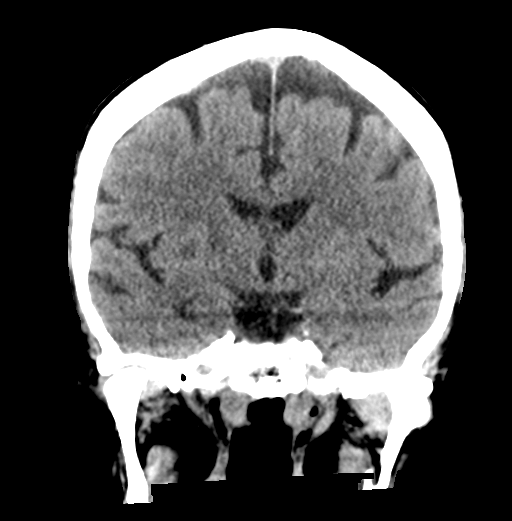

[Series 5: sagittal soft · sagittal · 0.38mm/px · 3 of 56 slices shown]
[im 19/56  brain]
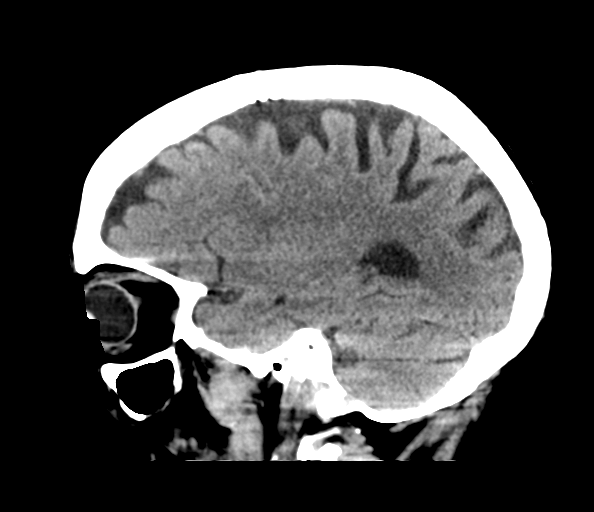
[im 28/56  brain]
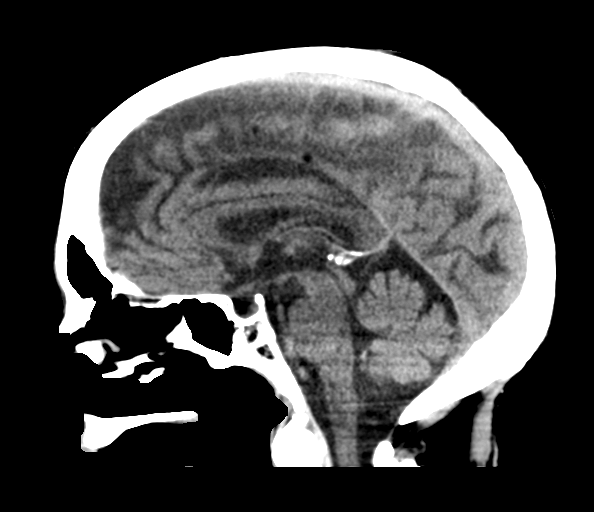
[im 37/56  brain]
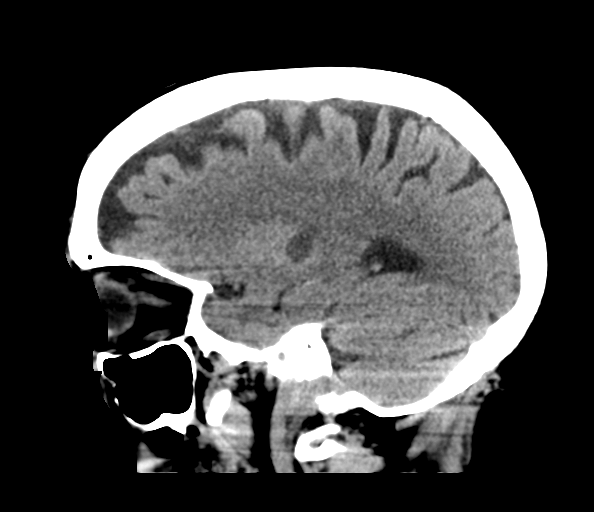

[15 of 47 positions shown; findings below may reference images not displayed]

FINDINGS: Brain: There are focal hypodensities in the right lentiform nucleus.
The more medial of these foci is hypodense and may reflect a remote
infarct; however, the more lateral lesion is less hypodense and is
suspicious for subacute infarct. There is no evidence of acute
intracranial hemorrhage or extra-axial fluid collection.

Parenchymal volume is normal.  The ventricles are normal in size.

There is no mass lesion.  There is no midline shift.

Vascular: There is calcification of the bilateral cavernous ICAs.

Skull: Normal. Negative for fracture or focal lesion.

Sinuses/Orbits: The paranasal sinuses are clear. The globes and
orbits are unremarkable.

Other: None.

ASPECTS ([place] Stroke Program Early CT Score)

- Ganglionic level infarction (caudate, lentiform nuclei, internal
capsule, insula, M1-M3 cortex): 6

- Supraganglionic infarction (M4-M6 cortex): 3

Total score (0-10 with 10 being normal): 9
IMPRESSION: 1. Small focal hypodensity in the right lentiform nucleus suspicious
for subacute infarct. No evidence of intracranial hemorrhage.
2. ASPECTS is 9

These results were called by telephone at the time of interpretation
on [DATE] at [DATE] to provider DENDO , who verbally
acknowledged these results.

## 2021-09-02 IMAGING — DX DG CHEST 1V PORT
1 series · 1 of 1 positions shown · non-contrast
Comparison: [DATE]

CLINICAL DATA: Slurred speech.

EXAM:
PORTABLE CHEST 1 VIEW

[chest ap]
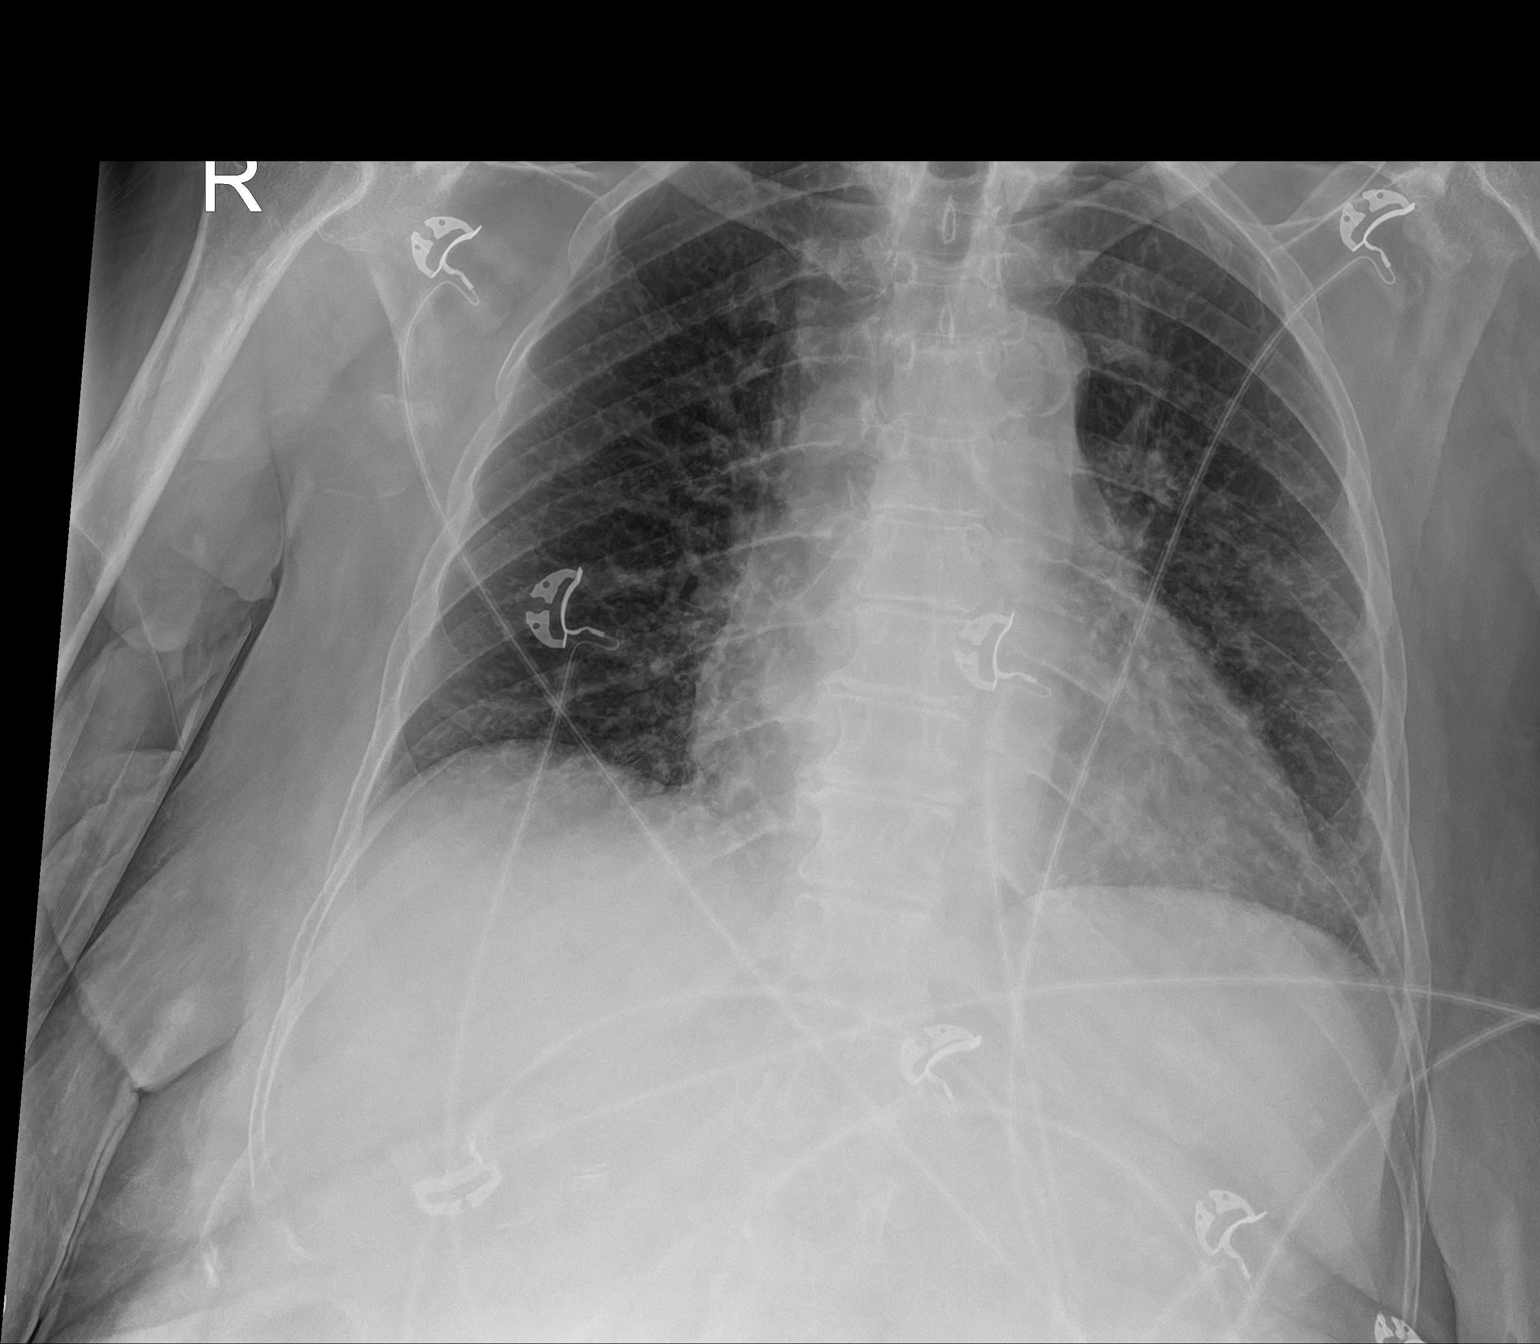

[1 of 1 positions shown; findings below may reference images not displayed]

FINDINGS: Mild, hazy atelectasis and/or early infiltrate is seen within the
bilateral lung bases and mid left lung. There is no evidence of a
pleural effusion or pneumothorax. The heart size and mediastinal
contours are within normal limits. There is moderate severity
calcification of the aortic arch. The visualized skeletal structures
are unremarkable.
IMPRESSION: Mild bibasilar and mid left lung atelectasis and/or early
infiltrate.

## 2021-09-02 MED ORDER — NITROGLYCERIN IN D5W 200-5 MCG/ML-% IV SOLN
5.0000 ug/min | INTRAVENOUS | Status: DC
Start: 2021-09-02 — End: 2021-09-02

## 2021-09-02 MED ORDER — IOHEXOL 350 MG/ML SOLN
100.0000 mL | Freq: Once | INTRAVENOUS | Status: AC | PRN
  Administered 2021-09-02: 100 mL via INTRAVENOUS

## 2021-09-02 MED ORDER — STROKE: EARLY STAGES OF RECOVERY BOOK
Freq: Once | Status: AC
  Filled 2021-09-02: qty 1

## 2021-09-02 MED ORDER — LEVOTHYROXINE SODIUM 100 MCG PO TABS
200.0000 ug | ORAL_TABLET | Freq: Every day | ORAL | Status: DC
  Administered 2021-09-04: 200 ug via ORAL
  Filled 2021-09-02: qty 2

## 2021-09-02 MED ORDER — COLESTIPOL HCL 1 G PO TABS
1.0000 g | ORAL_TABLET | Freq: Every day | ORAL | Status: DC
  Filled 2021-09-02 (×5): qty 1

## 2021-09-02 MED ORDER — INSULIN DETEMIR 100 UNIT/ML ~~LOC~~ SOLN
45.0000 [IU] | Freq: Every day | SUBCUTANEOUS | Status: DC
  Administered 2021-09-04: 45 [IU] via SUBCUTANEOUS
  Filled 2021-09-02 (×5): qty 0.45

## 2021-09-02 MED ORDER — INSULIN ASPART 100 UNIT/ML IJ SOLN
0.0000 [IU] | Freq: Three times a day (TID) | INTRAMUSCULAR | Status: DC
  Administered 2021-09-03: 2 [IU] via SUBCUTANEOUS
  Administered 2021-09-03: 5 [IU] via SUBCUTANEOUS
  Administered 2021-09-04: 3 [IU] via SUBCUTANEOUS
  Filled 2021-09-02: qty 1

## 2021-09-02 MED ORDER — MIRTAZAPINE 15 MG PO TABS
7.5000 mg | ORAL_TABLET | Freq: Every day | ORAL | Status: DC
  Administered 2021-09-03: 7.5 mg via ORAL
  Filled 2021-09-02: qty 1

## 2021-09-02 MED ORDER — ASPIRIN 325 MG PO TABS
325.0000 mg | ORAL_TABLET | Freq: Once | ORAL | Status: DC
  Filled 2021-09-02: qty 1

## 2021-09-02 MED ORDER — DIVALPROEX SODIUM 250 MG PO DR TAB
250.0000 mg | DELAYED_RELEASE_TABLET | Freq: Every day | ORAL | Status: DC
  Administered 2021-09-04: 250 mg via ORAL
  Filled 2021-09-02: qty 1

## 2021-09-02 MED ORDER — ATORVASTATIN CALCIUM 10 MG PO TABS: 20.0000 mg | ORAL_TABLET | Freq: Every day | ORAL | Status: DC

## 2021-09-02 MED ORDER — TENECTEPLASE FOR STROKE
PACK | INTRAVENOUS | Status: AC
  Filled 2021-09-02: qty 10

## 2021-09-02 MED ORDER — DIVALPROEX SODIUM ER 500 MG PO TB24
1000.0000 mg | ORAL_TABLET | Freq: Every evening | ORAL | Status: DC
  Administered 2021-09-03: 1000 mg via ORAL
  Filled 2021-09-02 (×2): qty 2

## 2021-09-02 MED ORDER — ACETAMINOPHEN 160 MG/5ML PO SOLN: 650.0000 mg | ORAL | Status: DC | PRN

## 2021-09-02 MED ORDER — SENNOSIDES-DOCUSATE SODIUM 8.6-50 MG PO TABS: 1.0000 | ORAL_TABLET | Freq: Every evening | ORAL | Status: DC | PRN

## 2021-09-02 MED ORDER — SODIUM CHLORIDE 0.9 % IV SOLN: Freq: Once | INTRAVENOUS | Status: AC

## 2021-09-02 MED ORDER — ACETAMINOPHEN 325 MG PO TABS: 650.0000 mg | ORAL_TABLET | ORAL | Status: DC | PRN

## 2021-09-02 MED ORDER — PAROXETINE HCL 20 MG PO TABS
40.0000 mg | ORAL_TABLET | Freq: Every day | ORAL | Status: DC
  Administered 2021-09-04: 40 mg via ORAL
  Filled 2021-09-02: qty 2

## 2021-09-02 MED ORDER — QUETIAPINE FUMARATE 100 MG PO TABS
100.0000 mg | ORAL_TABLET | Freq: Every day | ORAL | Status: DC
  Administered 2021-09-03: 100 mg via ORAL
  Filled 2021-09-02: qty 1

## 2021-09-02 MED ORDER — GABAPENTIN 100 MG PO CAPS
100.0000 mg | ORAL_CAPSULE | Freq: Three times a day (TID) | ORAL | Status: DC
  Administered 2021-09-03 – 2021-09-04 (×3): 100 mg via ORAL
  Filled 2021-09-02 (×3): qty 1

## 2021-09-02 MED ORDER — ACETAMINOPHEN 650 MG RE SUPP: 650.0000 mg | RECTAL | Status: DC | PRN

## 2021-09-02 MED ORDER — INSULIN ASPART 100 UNIT/ML IJ SOLN: 0.0000 [IU] | Freq: Every day | INTRAMUSCULAR | Status: DC

## 2021-09-02 MED ORDER — HEPARIN SODIUM (PORCINE) 5000 UNIT/ML IJ SOLN
5000.0000 [IU] | Freq: Three times a day (TID) | INTRAMUSCULAR | Status: DC
  Administered 2021-09-03 – 2021-09-04 (×4): 5000 [IU] via SUBCUTANEOUS
  Filled 2021-09-02 (×4): qty 1

## 2021-09-02 NOTE — ED Notes (Signed)
Rcems from home. They were sent out earlier this evening for a fall around 7:15pm. She seemed to be her baseline. Alert and oriented. No complaints of weakness.  She fell again around 7:30:7:45pm. And pushed her life alert button- pt lives alone. When EMS arrived pt had left sided droop and weakness.  CBG 198  18G L AC by EMS

## 2021-09-02 NOTE — ED Notes (Signed)
Tele neuro informed RN patient is not candidate for TNK and is ordering further CT studies.

## 2021-09-02 NOTE — ED Provider Notes (Signed)
[facility] EMERGENCY DEPARTMENT Provider Note   CSN: [id number] Arrival date & time: 09/02/21  2004  An emergency department physician performed an initial assessment on this suspected stroke patient at 2005.  History Chief Complaint  Patient presents with   Code Stroke    Claudia Keller is a 70 y.o. female.  Patient presented with slurred speech and left-sided weakness.  Code stroke was called  The history is provided by the EMS personnel and medical records.  Weakness Severity:  Severe Onset quality:  Sudden Timing:  Constant Progression:  Unchanged Chronicity:  New Context: not alcohol use   Relieved by:  Nothing Worsened by:  Nothing Ineffective treatments:  None tried Associated symptoms: no abdominal pain       Past Medical History:  Diagnosis Date   Arthritis    COPD (chronic obstructive pulmonary disease) ([place])    Diverticulosis    Essential hypertension, benign    Hx of colonic polyps    Hypothyroidism    IBS (irritable bowel syndrome)    Irritable bowel syndrome    Ketoacidosis    Mixed hyperlipidemia    PTSD (post-traumatic stress disorder)    S/P colonoscopy Jan 2011   RMR: pancolonic diverticula, polypoid rectal mucosa with  prominent lymphoid aggregates, repeat in Jan 2016    Shortness of breath dyspnea    Stress incontinence, female    Thyroid disease    Tobacco use    Type 2 diabetes mellitus ([place])     Patient Active Problem List   Diagnosis Date Noted   Pneumonia due to COVID-19 virus 12/07/2020   Leukocytosis 12/07/2020   Hyperglycemia due to diabetes mellitus ([place]) 12/07/2020   Hypoalbuminemia 12/07/2020   Elevated AST (SGOT) 12/07/2020   Obesity (BMI 30-39.9) 12/07/2020   COPD (chronic obstructive pulmonary disease) ([place]) 12/07/2020   GERD (gastroesophageal reflux disease) 12/07/2020   Diabetic neuropathy ([place]) 12/07/2020   Depression 12/07/2020   Anxiety 12/07/2020   Acute respiratory disease due to COVID-19 virus 12/07/2020    Acute respiratory failure with hypoxia ([place]) 12/01/2020   Segmental colitis without complication (HCC)    Class 2 obesity due to excess calories with body mass index (BMI) of 37.0 to 37.9 in adult    Ischemic colitis ([place]) 03/19/2020   Noncompliance with medication regimen 03/18/2020   Acute renal failure superimposed on stage 3a chronic kidney disease ([place]) 03/18/2020   DKA (diabetic ketoacidoses) 03/17/2020   Bipolar II disorder ([place]) 04/17/2018   PTSD (post-traumatic stress disorder) 04/17/2018   Personal history of noncompliance with medical treatment, presenting hazards to health 08/17/2016   Emesis 05/20/2016   Uncontrolled diabetes mellitus with stage 2 chronic kidney disease 09/08/2015   Essential hypertension, benign 09/08/2015   Primary hypothyroidism 09/08/2015   Diverticulosis of colon without hemorrhage    Nausea vomiting and diarrhea 10/27/2011   Chest pain 06/24/2011   Hypercholesterolemia 06/24/2011   Tobacco abuse 06/24/2011   IRRITABLE BOWEL SYNDROME 11/12/2009   Diarrhea 11/12/2009   OTHER SYMPTOMS INVOLVING DIGESTIVE SYSTEM OTHER 11/12/2009   History of colonic polyps 11/12/2009   Closed fracture of metatarsal bone 11/30/2007   HIGH BLOOD PRESSURE 11/30/2007    Past Surgical History:  Procedure Laterality Date   Bilateral tubal ligation     BREAST BIOPSY Left 10/16/2014   negative   CHOLECYSTECTOMY  09/11/2012   Procedure: LAPAROSCOPIC CHOLECYSTECTOMY;  Surgeon: Jamesetta So, MD;  Location: [facility] ORS;  Service: General;  Laterality: N/A;   COLONOSCOPY  11/17/2009   Dr. Gala Romney:  normal rectum, pancolonic diverticula, polyps benign, no adenomas.    COLONOSCOPY N/A 02/12/2015   Dr. Gala Romney: colonic diverticulosis, multiple colonic polyps (tubular adenomas), negative segmental biopsies. Surveillance in 2021   ESOPHAGOGASTRODUODENOSCOPY  11/23/2011   Dr. Gala Romney: Erosive reflux esophagitis; small hiatal hernia; esophagus dilated empirically   MALONEY DILATION  11/23/2011    Procedure: Venia Minks DILATION;  Surgeon: Daneil Dolin, MD;  Location: [facility] ENDO SUITE;  Service: Endoscopy;  Laterality: N/A;   SKIN LESION EXCISION     on buttocks   TUBAL LIGATION       OB History     Gravida  4   Para  4   Term  4   Preterm      AB      Living  4      SAB      IAB      Ectopic      Multiple      Live Births              Family History  Problem Relation Age of Onset   Schizophrenia Brother    Stroke Brother    Cancer Mother        unsure what type   Heart disease Father    Heart attack Father    Coronary artery disease Other    Diabetes type II Other    Cancer Son        bladder   Colon cancer Neg Hx     Social History   Tobacco Use   Smoking status: Every Day    Packs/day: 2.00    Years: 43.00    Pack years: 86.00    Types: Cigarettes   Smokeless tobacco: Never  Substance Use Topics   Alcohol use: No    Alcohol/week: 0.0 standard drinks   Drug use: No    Home Medications Prior to Admission medications   Medication Sig Start Date End Date Taking? Authorizing Provider  albuterol (VENTOLIN HFA) 108 (90 Base) MCG/ACT inhaler Inhale 2 puffs into the lungs every 4 (four) hours as needed for wheezing or shortness of breath. 12/03/20   Manuella Ghazi, Pratik D, DO  atorvastatin (LIPITOR) 20 MG tablet Take 1 tablet (20 mg total) by mouth at bedtime. 12/12/20   Kathie Dike, MD  Calcium Carb-Cholecalciferol (OYSTER SHELL CALCIUM W/D) 500-200 MG-UNIT TABS Take 1 tablet by mouth 2 (two) times daily. 11/10/20   [provider]  cetirizine (ZYRTEC) 10 MG tablet Take 10 mg by mouth daily. 11/10/20   [provider]  colestipol (COLESTID) 1 g tablet TAKE 2 TABLETS DAILY FOR DIARRHEA. DO NOT TAKE WITHIN 2 HOURS OF OTHER ORAL MEDICATIONS. HOLD FOR CONSTIPATION. 05/23/20   Carlis Stable, NP  Continuous Blood Gluc Sensor (FREESTYLE LIBRE 2 SENSOR) MISC 1 Device by Does not apply route every 14 (fourteen) days. 08/27/21   Renato Shin, MD   diltiazem (CARDIZEM CD) 120 MG 24 hr capsule Take 1 capsule (120 mg total) by mouth daily. 12/12/20 12/12/21  Kathie Dike, MD  divalproex (DEPAKOTE ER) 500 MG 24 hr tablet Take 2 tablets (1,000 mg total) by mouth every evening. 02/20/21   Norman Clay, MD  divalproex (DEPAKOTE) 250 MG DR tablet Take 1 tablet (250 mg total) by mouth daily. 02/20/21   Norman Clay, MD  furosemide (LASIX) 20 MG tablet Take 20 mg by mouth daily.    [provider]  gabapentin (NEURONTIN) 100 MG capsule Take 100 mg by mouth 3 (three) times  daily.    [provider]  glucose blood (QUINTET BLOOD GLUCOSE TEST) test strip Check blood sugar 2 times daily E11.65 08/12/21   Renato Shin, MD  guaiFENesin-dextromethorphan (ROBITUSSIN DM) 100-10 MG/5ML syrup Take 10 mLs by mouth every 4 (four) hours as needed for cough. 12/03/20   Manuella Ghazi, Pratik D, DO  Insulin NPH, Human,, Isophane, (NOVOLIN N FLEXPEN) 100 UNIT/ML Kiwkpen Inject 60 Units into the skin every morning. 08/27/21   Renato Shin, MD  levothyroxine (SYNTHROID) 100 MCG tablet Take 2 tablets (200 mcg total) by mouth daily before breakfast. 12/12/20   Kathie Dike, MD  lisinopril (PRINIVIL,ZESTRIL) 20 MG tablet Take 20 mg by mouth daily.    [provider]  mirtazapine (REMERON) 7.5 MG tablet Take 1 tablet (7.5 mg total) by mouth at bedtime. 02/20/21   Norman Clay, MD  nicotine polacrilex (NICORETTE) 4 MG gum Take 1 each (4 mg total) by mouth as needed for smoking cessation. 12/12/20   Kathie Dike, MD  ondansetron (ZOFRAN) 4 MG tablet Take 1 tablet (4 mg total) by mouth every 8 (eight) hours as needed for nausea or vomiting. 10/17/18   Carlis Stable, NP  pantoprazole (PROTONIX) 40 MG tablet TAKE 1 TABLET BY MOUTH ONCE DAILY 30 MINTUES BEFORE BREAKFAST. Patient taking differently: Take 40 mg by mouth daily before breakfast. 05/23/20   Carlis Stable, NP  PARoxetine (PAXIL) 40 MG tablet Take 1 tablet (40 mg total) by mouth daily. 02/20/21   Norman Clay, MD  QUEtiapine (SEROQUEL) 100 MG tablet Take 1 tablet (100 mg total) by mouth at bedtime. 02/20/21   Norman Clay, MD    Allergies    Patient has no known allergies.  Review of Systems   Review of Systems  Unable to perform ROS: Mental status change  Gastrointestinal:  Negative for abdominal pain.  Neurological:  Positive for weakness.   Physical Exam Updated Vital Signs BP 123/62   Pulse 74   Temp 98.5 F (36.9 C) (Oral)   Resp 11   Ht 5\' 4"  (1.626 m)   Wt 69.5 kg   SpO2 95%   BMI 26.30 kg/m   Physical Exam Constitutional:      Appearance: She is well-developed.  HENT:     Head: Normocephalic.     Nose: Nose normal.  Eyes:     General: No scleral icterus.    Conjunctiva/sclera: Conjunctivae normal.  Neck:     Thyroid: No thyromegaly.  Cardiovascular:     Rate and Rhythm: Normal rate and regular rhythm.     Heart sounds: No murmur heard.   No friction rub. No gallop.  Pulmonary:     Breath sounds: No stridor. No wheezing or rales.  Chest:     Chest wall: No tenderness.  Abdominal:     General: There is no distension.     Tenderness: There is no abdominal tenderness. There is no rebound.  Musculoskeletal:        General: Normal range of motion.     Cervical back: Neck supple.  Lymphadenopathy:     Cervical: No cervical adenopathy.  Skin:    Findings: No erythema or rash.  Neurological:     Mental Status: She is alert.     Motor: No abnormal muscle tone.     Coordination: Coordination normal.     Comments: Patient has severe weakness left arm left leg.  She is unable to follow commands or answer questions    ED Results / Procedures /  Treatments   Labs (all labs ordered are listed, but only abnormal results are displayed) Labs Reviewed  COMPREHENSIVE METABOLIC PANEL - Abnormal; Notable for the following components:      Result Value   Sodium 132 (*)    Glucose, Bld 164 (*)    Creatinine, Ser 1.20 (*)    Total Protein 6.1 (*)    GFR,  Estimated 49 (*)    All other components within normal limits  CBG MONITORING, ED - Abnormal; Notable for the following components:   Glucose-Capillary 171 (*)    All other components within normal limits  RESP PANEL BY RT-PCR (FLU A&B, COVID) ARPGX2  ETHANOL  PROTIME-INR  APTT  CBC  DIFFERENTIAL  RAPID URINE DRUG SCREEN, HOSP PERFORMED  URINALYSIS, ROUTINE W REFLEX MICROSCOPIC    EKG None  Radiology CT HEAD CODE STROKE WO CONTRAST  Result Date: 09/02/2021 CLINICAL DATA:  Code stroke.  Slurred speech, left facial droop EXAM: CT HEAD WITHOUT CONTRAST TECHNIQUE: Contiguous axial images were obtained from the base of the skull through the vertex without intravenous contrast. COMPARISON:  None. FINDINGS: Brain: There are focal hypodensities in the right lentiform nucleus. The more medial of these foci is hypodense and may reflect a remote infarct; however, the more lateral lesion is less hypodense and is suspicious for subacute infarct. There is no evidence of acute intracranial hemorrhage or extra-axial fluid collection. Parenchymal volume is normal.  The ventricles are normal in size. There is no mass lesion.  There is no midline shift. Vascular: There is calcification of the bilateral cavernous ICAs. Skull: Normal. Negative for fracture or focal lesion. Sinuses/Orbits: The paranasal sinuses are clear. The globes and orbits are unremarkable. Other: None. ASPECTS [facility] Stroke Program Early CT Score) - Ganglionic level infarction (caudate, lentiform nuclei, internal capsule, insula, M1-M3 cortex): 6 - Supraganglionic infarction (M4-M6 cortex): 3 Total score (0-10 with 10 being normal): 9 IMPRESSION: 1. Small focal hypodensity in the right lentiform nucleus suspicious for subacute infarct. No evidence of intracranial hemorrhage. 2. ASPECTS is 9 These results were called by telephone at the time of interpretation on 09/02/2021 at 8:29 pm to provider Zamyah Wiesman , who verbally acknowledged these  results. Electronically Signed   By: Valetta Mole M.D.   On: 09/02/2021 20:31   CT ANGIO HEAD NECK W WO CM W PERF  Result Date: 09/02/2021 CLINICAL DATA:  Initial evaluation for acute left-sided weakness, dysarthria, left facial droop. EXAM: CT ANGIOGRAPHY HEAD AND NECK CT PERFUSION BRAIN TECHNIQUE: Multidetector CT imaging of the head and neck was performed using the standard protocol during bolus administration of intravenous contrast. Multiplanar CT image reconstructions and MIPs were obtained to evaluate the vascular anatomy. Carotid stenosis measurements (when applicable) are obtained utilizing NASCET criteria, using the distal internal carotid diameter as the denominator. Multiphase CT imaging of the brain was performed following IV bolus contrast injection. Subsequent parametric perfusion maps were calculated using RAPID software. CONTRAST:  133mL OMNIPAQUE IOHEXOL 350 MG/ML SOLN COMPARISON:  Prior noncontrast head CT from earlier the same day. FINDINGS: CTA NECK FINDINGS Aortic arch: Visualized aortic arch normal caliber with normal 3 vessel morphology. Moderate atheromatous change about the arch and origin of the great vessels without high-grade stenosis. Right carotid system: Right CCA patent without stenosis or occlusion. Mild atheromatous change about the right carotid bifurcation without significant stenosis. Right ICA patent distally without stenosis, dissection or occlusion. Left carotid system: Left CCA patent from its origin to the bifurcation without stenosis. Mild for age  atheromatous change about the left carotid bifurcation without significant stenosis. Possible small penetrating plaque versus occluded arterial branch noted arising from the bifurcation itself (series 6, image 210). Left ICA patent distally without stenosis, dissection or occlusion. Vertebral arteries: Both vertebral arteries arise from the subclavian arteries. Atheromatous change at the origins of both vertebral arteries  with associated moderate approximate 50% stenosis on the left. Left vertebral artery is dominant. Vertebral arteries otherwise patent distally without stenosis, dissection or occlusion. Skeleton: No worrisome lytic or blastic osseous lesions. Moderate multilevel cervical spondylosis at C3-4 through C7-T1. Patient is edentulous. Other neck: No other acute soft tissue abnormality within the neck. No mass or adenopathy. Thyroid is hypoplastic and/or absent. Upper chest: Scattered ground-glass opacity with interlobular septal thickening seen within the visualized lungs, favored to reflect pulmonary interstitial congestion/edema. Remainder the visualized upper chest demonstrates no other acute finding. Review of the MIP images confirms the above findings CTA HEAD FINDINGS Anterior circulation: Petrous segments patent bilaterally. Atheromatous change within the carotid siphons with associated mild to moderate multifocal narrowing. A1 segments patent bilaterally. Left A1 hypoplastic. Normal anterior communicating artery complex. Anterior cerebral arteries irregular but patent to their distal aspects without high-grade stenosis. M1 segments are somewhat small but patent without hemodynamically significant stenosis. Normal MCA bifurcations. No proximal MCA branch occlusion. Moderate small vessel atheromatous irregularity seen within the MCA branches bilaterally. Posterior circulation: Both V4 segments patent to the vertebrobasilar junction without stenosis. Both PICA origins patent and normal. Basilar patent to its distal aspect without stenosis. Superior cerebellar arteries patent bilaterally. Left PCA supplied via the basilar. Fetal type origin of the right PCA. Atheromatous irregularity within the PCAs bilaterally without high-grade stenosis. Venous sinuses: Grossly patent allowing for timing the contrast bolus. Non opacification of the left sigmoid sinus favored to be related to timing of the contrast bolus. Anatomic  variants: Fetal type origin of the right PCA. Hypoplastic left A1 segment. No aneurysm. Review of the MIP images confirms the above findings CT Brain Perfusion Findings: ASPECTS: 9 CBF (<30%) Volume: 30mL Perfusion (Tmax>6.0s) volume: 52mL Mismatch Volume: 65mL Infarction Location:Negative CT perfusion with no evidence for acute ischemia. No other perfusion deficit. IMPRESSION: 1. Negative CTA for emergent large vessel occlusion. 2. Negative CT perfusion with no evidence for acute ischemia or other perfusion abnormality. 3. 50% atheromatous stenosis at the origin of the dominant left vertebral artery. 4. Additional moderate atheromatous disease elsewhere about the major arterial vasculature of the head and neck as above. No other hemodynamically significant or correctable stenosis. 5. Scattered ground-glass opacity with interlobular septal thickening within the visualized lungs, favored to reflect pulmonary interstitial congestion/edema. Results were called by telephone at the time of interpretation on 09/02/2021 at 9:27 pm to provider Dr. Roderic Palau, Who verbally acknowledged these results. Electronically Signed   By: Jeannine Boga M.D.   On: 09/02/2021 21:35    Procedures Procedures   Medications Ordered in ED Medications  tenecteplase (TNKASE) 50 MG injection for Stroke (  Not Given 09/02/21 2115)  aspirin tablet 325 mg (325 mg Oral Not Given 09/02/21 2118)  iohexol (OMNIPAQUE) 350 MG/ML injection 100 mL (100 mLs Intravenous Contrast Given 09/02/21 2058)    ED Course  I have reviewed the triage vital signs and the nursing notes.  Pertinent labs & imaging results that were available during my care of the patient were reviewed by me and considered in my medical decision making (see chart for details). CRITICAL CARE Performed by: Milton Ferguson Total critical care time: 22  minutes Critical care time was exclusive of separately billable procedures and treating other patients. Critical care was  necessary to treat or prevent imminent or life-threatening deterioration. Critical care was time spent personally by me on the following activities: development of treatment plan with patient and/or surrogate as well as nursing, discussions with consultants, evaluation of patient's response to treatment, examination of patient, obtaining history from patient or surrogate, ordering and performing treatments and interventions, ordering and review of laboratory studies, ordering and review of radiographic studies, pulse oximetry and re-evaluation of patient's condition.  Patient was seen by telemetry neurology and they felt like she had a stroke within the last 3 months.  CT scan shows subacute stroke.  tPA with that not given to the patient and she was admitted for stroke work-up MDM Rules/Calculators/A&P                          Large stroke involving slurred speech and weakness in left arm and leg Final Clinical Impression(s) / ED Diagnoses Final diagnoses:  None    Rx / DC Orders ED Discharge Orders     None        Milton Ferguson, MD 09/05/21 1245

## 2021-09-02 NOTE — Consult Note (Addendum)
ADDENDUM: Advanced Imaging:  CTA Head and Neck Completed.  CTP Completed.  LVO:No   ---   [facility] Tele[facility]urology Consult Services   Patient Name:   Claudia Keller, Claudia Keller Date of Birth:   11/18/1950 Identification Number:   MRN - [id number] Date of Service:   09/02/2021 20:15:36  Diagnosis:       R53.1 - Weakness  Impression:      70 yo LH F with h/o HTN, HLD, tobacco abuse, PTSD, bipolar, diabetes c/b neuropathy, CKD stage III, presenting with report of sudden onset L sided weakness and dysarthria. Not a thrombolytic candidate due to subaucte stroke seen on CT and pt reporting actually had recent sudden onset L sided weakness a couple of months ago, therefore most likely had stroke within past 3 months which would be a contraindication. Advanced imaging pending. Will need stroke workup. Discussed with ED provider by phone.  Metrics: Last Known Well: 09/02/2021 19:10:00 [facility] Notification Time: 09/02/2021 20:15:36 Arrival Time: 09/02/2021 20:04:00 Stamp Time: 09/02/2021 20:15:36 Initial Response Time: 09/02/2021 20:16:20 Symptoms: L sided weakness. NIHSS Start Assessment Time: 09/02/2021 20:22:23 Patient is not a candidate for Thrombolytic. Thrombolytic Medical Decision: 09/02/2021 20:36:26 Patient was not deemed candidate for Thrombolytic because of following reasons: Stroke in last 3 months. Weight Noted by Staff: 69.5 kg  CT head was reviewed and results were: I personally Reviewed the CT Head and it Showed R lentiform nucleus hypodensity read as subacute infarct.  ED Physician notified of diagnostic impression and management plan on 09/02/2021 20:48:29  Advanced Imaging: CTA Head and Neck Ordered:  CTP Ordered:   Our recommendations are outlined below.  Recommendations:        Stroke/Telemetry Floor       Neuro Checks       Bedside Swallow Eval       DVT Prophylaxis       IV Fluids, Normal Saline       Head of Bed 30 Degrees        Euglycemia and Avoid Hyperthermia (PRN Acetaminophen)       Initiate or continue Aspirin 325 MG daily       Antihypertensives PRN if Blood pressure is greater than 220/120 or there is a concern for End organ damage/contraindications for permissive HTN. If blood pressure is greater than 220/120 give labetalol PO or IV or Vasotec IV with a goal of 15% reduction in BP during the first 24 hours.  Routine Consultation with [facility] for Follow up Care  Sign Out:       Discussed with Emergency Department Provider    ------------------------------------------------------------------------------  History of Present Illness: Patient is a 70 year old Female.  Patient was brought by EMS for symptoms of L sided weakness.  70 yo LH F with h/o HTN, HLD, tobacco abuse, PTSD, bipolar, diabetes c/b neuropathy, CKD stage III, presenting with L sided weakness.  She fell at home earlier today. No head trauma today or recently. Apparently she falls quite a lot per EMS. Pt reports she fell last year and broke 3 toes and has had issues since. She uses a wheelchair and a walker. mRS 3. She has an aide who helps her get washed up and standing up. She reports that today she slid and fell down. EMS came out but she declined to come to the hospital which is apparently usual.  LKN 1910. Then she developed L sided weakness and EMS was called back.  A couple of months ago she developed sudden onset L sided  weakness. Said she didn't go to the hospital because she didn't know anything was wrong. Has had some mild L sided weakness ever since. Delay to thrombolytic decision due to this information not being initially available to me (asked further about recent stroke symptoms when CT read came back with subacute stroke).    Past Medical History:      Hypertension      Diabetes Mellitus      Hyperlipidemia  Anticoagulant use:  No  Antiplatelet use: No  Allergies:  Reviewed,NKDA      Examination: BP(140/65), Pulse(81), Blood Glucose(170) 1A: Level of Consciousness - Alert; keenly responsive + 0 1B: Ask Month and Age - 1 Question Right + 1 1C: Blink Eyes & Squeeze Hands - Performs Both Tasks + 0 2: Test Horizontal Extraocular Movements - Normal + 0 3: Test Visual Fields - No Visual Loss + 0 4: Test Facial Palsy (Use Grimace if Obtunded) - Minor paralysis (flat nasolabial fold, smile asymmetry) + 1 5A: Test Left Arm Motor Drift - Drift, but doesn't hit bed + 1 5B: Test Right Arm Motor Drift - No Drift for 10 Seconds + 0 6A: Test Left Leg Motor Drift - Drift, but doesn't hit bed + 1 6B: Test Right Leg Motor Drift - No Drift for 5 Seconds + 0 7: Test Limb Ataxia (FNF/Heel-Shin) - No Ataxia + 0 8: Test Sensation - Normal; No sensory loss + 0 9: Test Language/Aphasia - Normal; No aphasia + 0 10: Test Dysarthria - Mild-Moderate Dysarthria: Slurring but can be understood + 1 11: Test Extinction/Inattention - No abnormality + 0  NIHSS Score: 5  NIHSS Free Text :  Michela Pitcher age is 51  Pre-Morbid Modified Rankin Scale: 1 Points = No significant disability despite symptoms; able to carry out all usual duties and activities   Patient/Family was informed the Neurology Consult would occur via TeleHealth consult by way of interactive audio and video telecommunications and consented to receiving care in this manner.   Patient is being evaluated for possible acute neurologic impairment and high probability of imminent or life-threatening deterioration. I spent total of 30 minutes providing care to this patient, including time for face to face visit via telemedicine, review of medical records, imaging studies and discussion of findings with providers, the patient and/or family.   Dr Raymond Gurney   [facility] [phone]  Case [id number]

## 2021-09-02 NOTE — ED Notes (Signed)
Neuro requested RN to bring TNK to bedside.

## 2021-09-02 NOTE — ED Triage Notes (Signed)
[facility] called out and noticed pt having L sided facial droop slirred speech. Last normal was 7:10 pm. Pt taken to taken to CT upon arrival.   Pt had a fall earlier today and no blood thinners.

## 2021-09-03 ENCOUNTER — Observation Stay (HOSPITAL_COMMUNITY): Payer: Medicare (Managed Care)

## 2021-09-03 DIAGNOSIS — E1122 Type 2 diabetes mellitus with diabetic chronic kidney disease: Secondary | ICD-10-CM

## 2021-09-03 DIAGNOSIS — F0153 Vascular dementia, unspecified severity, with mood disturbance: Secondary | ICD-10-CM | POA: Diagnosis present

## 2021-09-03 DIAGNOSIS — I10 Essential (primary) hypertension: Secondary | ICD-10-CM

## 2021-09-03 DIAGNOSIS — E039 Hypothyroidism, unspecified: Secondary | ICD-10-CM

## 2021-09-03 DIAGNOSIS — N183 Chronic kidney disease, stage 3 unspecified: Secondary | ICD-10-CM | POA: Diagnosis present

## 2021-09-03 DIAGNOSIS — Z8719 Personal history of other diseases of the digestive system: Secondary | ICD-10-CM | POA: Diagnosis not present

## 2021-09-03 DIAGNOSIS — I129 Hypertensive chronic kidney disease with stage 1 through stage 4 chronic kidney disease, or unspecified chronic kidney disease: Secondary | ICD-10-CM

## 2021-09-03 DIAGNOSIS — Z79890 Hormone replacement therapy (postmenopausal): Secondary | ICD-10-CM | POA: Diagnosis not present

## 2021-09-03 DIAGNOSIS — I639 Cerebral infarction, unspecified: Secondary | ICD-10-CM | POA: Diagnosis present

## 2021-09-03 DIAGNOSIS — Z79899 Other long term (current) drug therapy: Secondary | ICD-10-CM | POA: Diagnosis not present

## 2021-09-03 DIAGNOSIS — I4891 Unspecified atrial fibrillation: Secondary | ICD-10-CM | POA: Diagnosis present

## 2021-09-03 DIAGNOSIS — R4182 Altered mental status, unspecified: Secondary | ICD-10-CM | POA: Diagnosis not present

## 2021-09-03 DIAGNOSIS — R29810 Facial weakness: Secondary | ICD-10-CM | POA: Diagnosis present

## 2021-09-03 DIAGNOSIS — E1140 Type 2 diabetes mellitus with diabetic neuropathy, unspecified: Secondary | ICD-10-CM | POA: Diagnosis present

## 2021-09-03 DIAGNOSIS — F319 Bipolar disorder, unspecified: Secondary | ICD-10-CM | POA: Diagnosis present

## 2021-09-03 DIAGNOSIS — F17210 Nicotine dependence, cigarettes, uncomplicated: Secondary | ICD-10-CM | POA: Diagnosis present

## 2021-09-03 DIAGNOSIS — R471 Dysarthria and anarthria: Secondary | ICD-10-CM | POA: Diagnosis present

## 2021-09-03 DIAGNOSIS — J449 Chronic obstructive pulmonary disease, unspecified: Secondary | ICD-10-CM | POA: Diagnosis present

## 2021-09-03 DIAGNOSIS — I6389 Other cerebral infarction: Secondary | ICD-10-CM

## 2021-09-03 DIAGNOSIS — G9349 Other encephalopathy: Secondary | ICD-10-CM | POA: Diagnosis present

## 2021-09-03 DIAGNOSIS — E782 Mixed hyperlipidemia: Secondary | ICD-10-CM | POA: Diagnosis present

## 2021-09-03 DIAGNOSIS — E538 Deficiency of other specified B group vitamins: Secondary | ICD-10-CM | POA: Diagnosis present

## 2021-09-03 DIAGNOSIS — Z794 Long term (current) use of insulin: Secondary | ICD-10-CM | POA: Diagnosis not present

## 2021-09-03 DIAGNOSIS — N182 Chronic kidney disease, stage 2 (mild): Secondary | ICD-10-CM

## 2021-09-03 DIAGNOSIS — I6340 Cerebral infarction due to embolism of unspecified cerebral artery: Secondary | ICD-10-CM | POA: Diagnosis present

## 2021-09-03 DIAGNOSIS — K589 Irritable bowel syndrome without diarrhea: Secondary | ICD-10-CM | POA: Diagnosis present

## 2021-09-03 DIAGNOSIS — Z823 Family history of stroke: Secondary | ICD-10-CM | POA: Diagnosis not present

## 2021-09-03 DIAGNOSIS — Z8249 Family history of ischemic heart disease and other diseases of the circulatory system: Secondary | ICD-10-CM | POA: Diagnosis not present

## 2021-09-03 DIAGNOSIS — Z8616 Personal history of COVID-19: Secondary | ICD-10-CM | POA: Diagnosis not present

## 2021-09-03 LAB — COMPREHENSIVE METABOLIC PANEL
ALT: 16 U/L (ref 0–44)
AST: 15 U/L (ref 15–41)
Albumin: 3.6 g/dL (ref 3.5–5.0)
Alkaline Phosphatase: 52 U/L (ref 38–126)
Anion gap: 7 (ref 5–15)
BUN: 17 mg/dL (ref 8–23)
CO2: 29 mmol/L (ref 22–32)
Calcium: 9.8 mg/dL (ref 8.9–10.3)
Chloride: 98 mmol/L (ref 98–111)
Creatinine, Ser: 1.06 mg/dL — ABNORMAL HIGH (ref 0.44–1.00)
GFR, Estimated: 57 mL/min — ABNORMAL LOW (ref >60)
Glucose, Bld: 148 mg/dL — ABNORMAL HIGH (ref 70–99)
Potassium: 3.7 mmol/L (ref 3.5–5.1)
Sodium: 134 mmol/L — ABNORMAL LOW (ref 135–145)
Total Bilirubin: 0.7 mg/dL (ref 0.3–1.2)
Total Protein: 6.4 g/dL — ABNORMAL LOW (ref 6.5–8.1)

## 2021-09-03 LAB — I-STAT CHEM 8, ED
BUN: 15 mg/dL (ref 8–23)
Calcium, Ion: 1.26 mmol/L (ref 1.15–1.40)
Chloride: 99 mmol/L (ref 98–111)
Creatinine, Ser: 1.2 mg/dL — ABNORMAL HIGH (ref 0.44–1.00)
Glucose, Bld: 163 mg/dL — ABNORMAL HIGH (ref 70–99)
HCT: 41 % (ref 36.0–46.0)
Hemoglobin: 13.9 g/dL (ref 12.0–15.0)
Potassium: 4.4 mmol/L (ref 3.5–5.1)
Sodium: 133 mmol/L — ABNORMAL LOW (ref 135–145)
TCO2: 23 mmol/L (ref 22–32)

## 2021-09-03 LAB — URINALYSIS, ROUTINE W REFLEX MICROSCOPIC
Bacteria, UA: NONE SEEN
Bilirubin Urine: NEGATIVE
Glucose, UA: NEGATIVE mg/dL
Hgb urine dipstick: NEGATIVE
Ketones, ur: NEGATIVE mg/dL
Leukocytes,Ua: NEGATIVE
Nitrite: NEGATIVE
Protein, ur: 100 mg/dL — AB
Specific Gravity, Urine: 1.027 (ref 1.005–1.030)
pH: 6 (ref 5.0–8.0)

## 2021-09-03 LAB — CBC
HCT: 42.7 % (ref 36.0–46.0)
Hemoglobin: 14.4 g/dL (ref 12.0–15.0)
MCH: 31.7 pg (ref 26.0–34.0)
MCHC: 33.7 g/dL (ref 30.0–36.0)
MCV: 94.1 fL (ref 80.0–100.0)
Platelets: 207 10*3/uL (ref 150–400)
RBC: 4.54 MIL/uL (ref 3.87–5.11)
RDW: 13.2 % (ref 11.5–15.5)
WBC: 7.9 10*3/uL (ref 4.0–10.5)
nRBC: 0 % (ref 0.0–0.2)

## 2021-09-03 LAB — ECHOCARDIOGRAM COMPLETE
AR max vel: 2.29 cm2
AV Area VTI: 2.64 cm2
AV Area mean vel: 2.35 cm2
AV Mean grad: 4 mmHg
AV Peak grad: 7.8 mmHg
Ao pk vel: 1.4 m/s
Area-P 1/2: 2.75 cm2
Height: 64 in
MV VTI: 2.07 cm2
S' Lateral: 2.1 cm
Weight: 2451.2 oz

## 2021-09-03 LAB — LIPID PANEL
Cholesterol: 275 mg/dL — ABNORMAL HIGH (ref 0–200)
HDL: 39 mg/dL — ABNORMAL LOW (ref >40)
LDL Cholesterol: 202 mg/dL — ABNORMAL HIGH (ref 0–99)
Total CHOL/HDL Ratio: 7.1 RATIO
Triglycerides: 169 mg/dL — ABNORMAL HIGH (ref <150)
VLDL: 34 mg/dL (ref 0–40)

## 2021-09-03 LAB — RAPID URINE DRUG SCREEN, HOSP PERFORMED
Amphetamines: NOT DETECTED
Barbiturates: NOT DETECTED
Benzodiazepines: NOT DETECTED
Cocaine: NOT DETECTED
Opiates: NOT DETECTED
Tetrahydrocannabinol: NOT DETECTED

## 2021-09-03 LAB — TSH: TSH: 28.65 u[IU]/mL — ABNORMAL HIGH (ref 0.350–4.500)

## 2021-09-03 LAB — CBG MONITORING, ED
Glucose-Capillary: 202 mg/dL — ABNORMAL HIGH (ref 70–99)
Glucose-Capillary: 76 mg/dL (ref 70–99)

## 2021-09-03 LAB — T4, FREE: Free T4: 0.84 ng/dL (ref 0.61–1.12)

## 2021-09-03 LAB — GLUCOSE, CAPILLARY
Glucose-Capillary: 138 mg/dL — ABNORMAL HIGH (ref 70–99)
Glucose-Capillary: 120 mg/dL — ABNORMAL HIGH (ref 70–99)

## 2021-09-03 LAB — HIV ANTIBODY (ROUTINE TESTING W REFLEX): HIV Screen 4th Generation wRfx: NONREACTIVE

## 2021-09-03 IMAGING — MR MR HEAD W/O CM
11 of 12 series · 41 of 48 positions shown · non-contrast
Comparison: CT/CTA head and neck dated 1 day prior

CLINICAL DATA: Altered mental status and weakness for 3 days

EXAM:
MRI HEAD WITHOUT CONTRAST
TECHNIQUE: Multiplanar, multiecho pulse sequences of the brain and surrounding
structures were obtained without intravenous contrast.

[Series 5: DWI · axial · 4.0mm · 0.88mm/px · z∈[-50,+90]mm · 4 of 36 slices shown (1 of 6)]
[im 1/36]
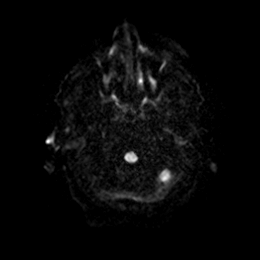
[im 12/36]
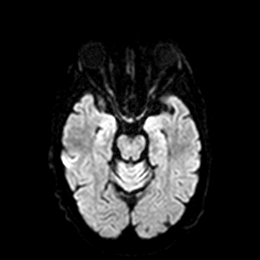
[im 24/36]
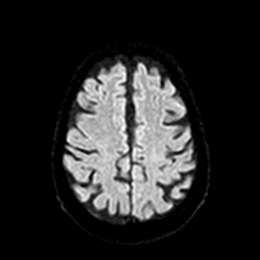
[im 36/36]
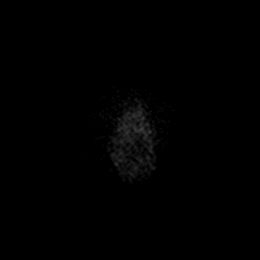

[Series 5: DWI · axial · 4.0mm · 0.88mm/px · z∈[-50,+90]mm · 4 of 36 slices shown (2 of 6)]
[im 1/36]
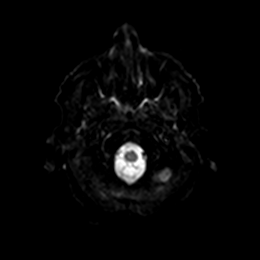
[im 12/36]
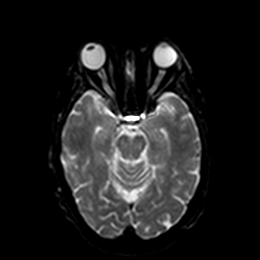
[im 24/36]
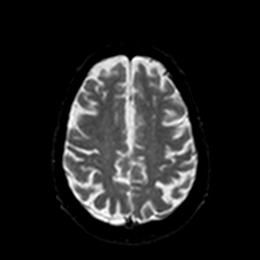
[im 36/36]
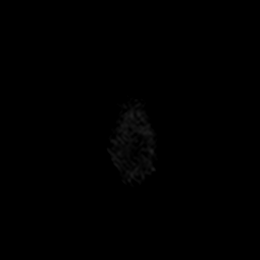

[Series 6: DWI · axial · 4.0mm · 0.88mm/px · z∈[-50,+90]mm · 4 of 36 slices shown (3 of 6)]
[im 1/36]
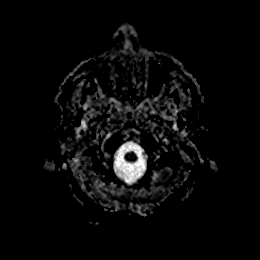
[im 12/36]
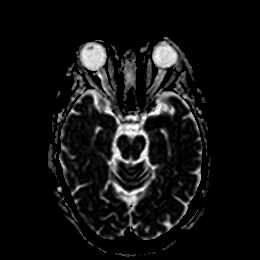
[im 24/36]
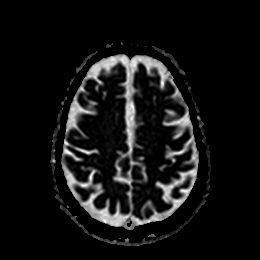
[im 36/36]
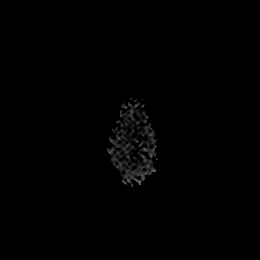

[Series 7: DWI · coronal · 5.0mm · 0.88mm/px · 4 of 30 slices shown (4 of 6)]
[im 1/30]
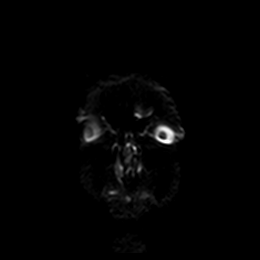
[im 10/30]
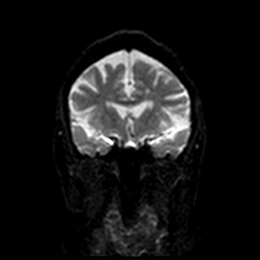
[im 20/30]
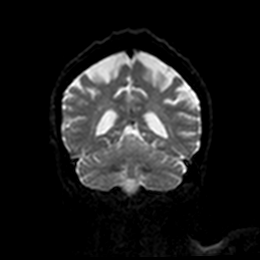
[im 30/30]
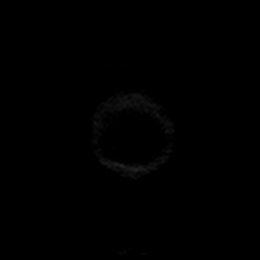

[Series 7: DWI · coronal · 5.0mm · 0.88mm/px · 4 of 30 slices shown (5 of 6)]
[im 1/30]
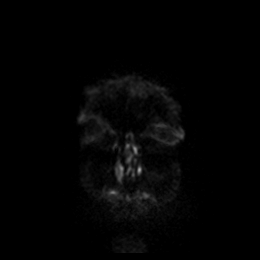
[im 10/30]
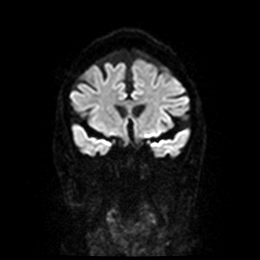
[im 20/30]
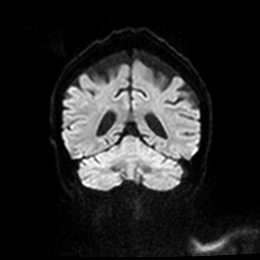
[im 30/30]
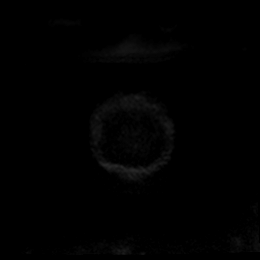

[Series 8: DWI · coronal · 5.0mm · 0.88mm/px · 4 of 30 slices shown (6 of 6)]
[im 1/30]
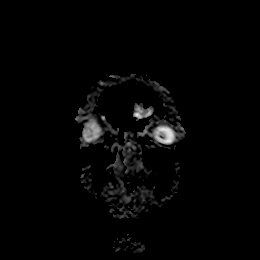
[im 10/30]
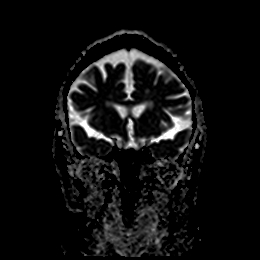
[im 20/30]
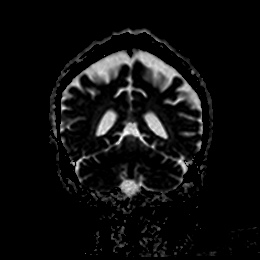
[im 30/30]
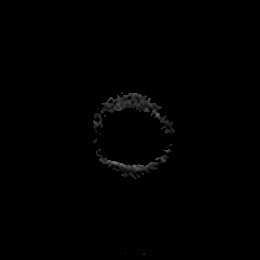

[Series 9: T1 · sagittal · 5.0mm · 0.94mm/px · 3 of 21 slices shown]
[im 1/21]
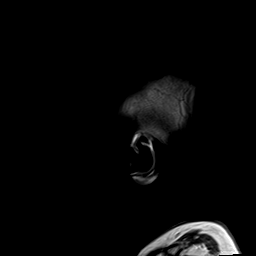
[im 11/21]
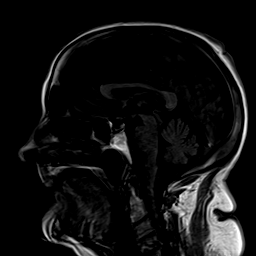
[im 21/21]
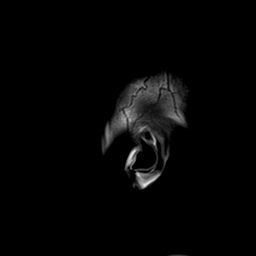

[Series 10: T2 · axial · 5.0mm · 0.72mm/px · z∈[-49,+91]mm · 3 of 21 slices shown (1 of 2)]
[im 1/21]
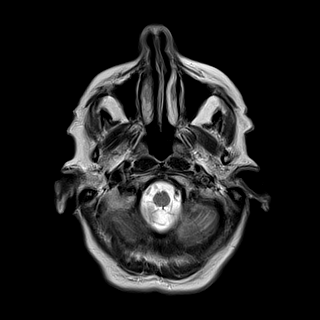
[im 11/21]
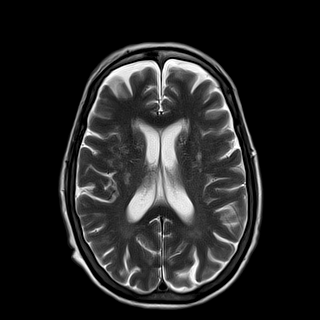
[im 21/21]
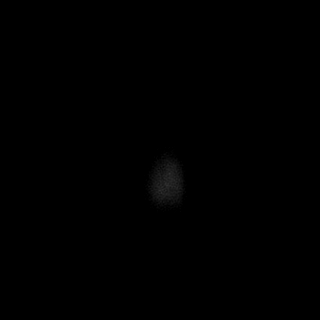

[Series 11: ax hemo · axial · 5.0mm · 0.86mm/px · z∈[-52,+92]mm · 3 of 25 slices shown]
[im 1/25]
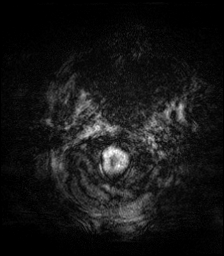
[im 13/25]
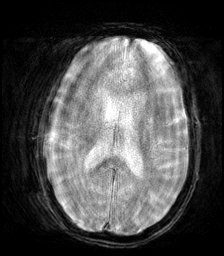
[im 25/25]
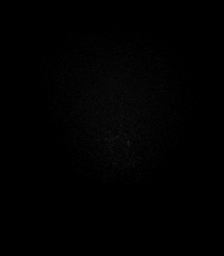

[Series 12: FLAIR · axial · 4.0mm · 0.43mm/px · z∈[-48,+88]mm · 4 of 35 slices shown]
[im 1/35]
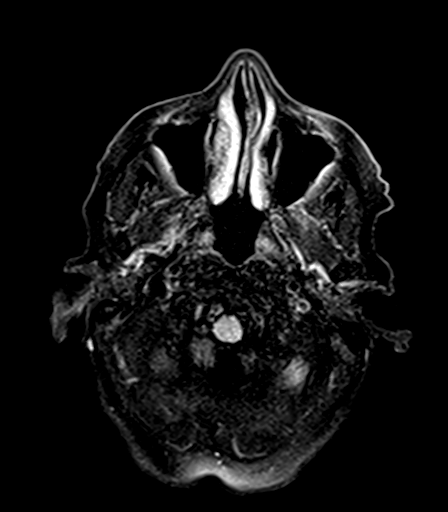
[im 12/35]
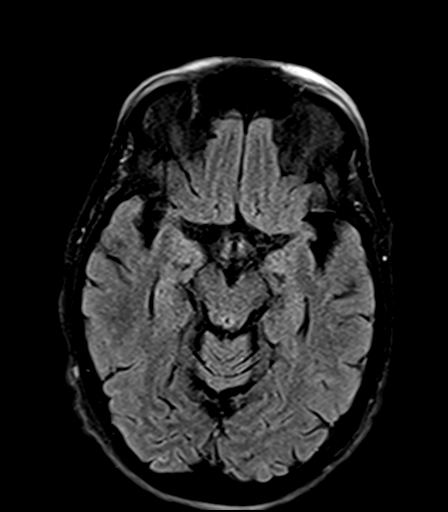
[im 23/35]
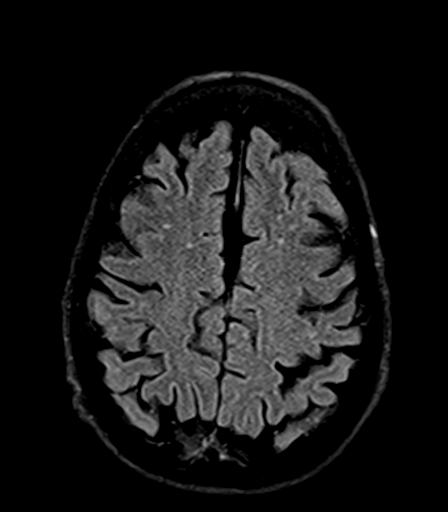
[im 35/35]
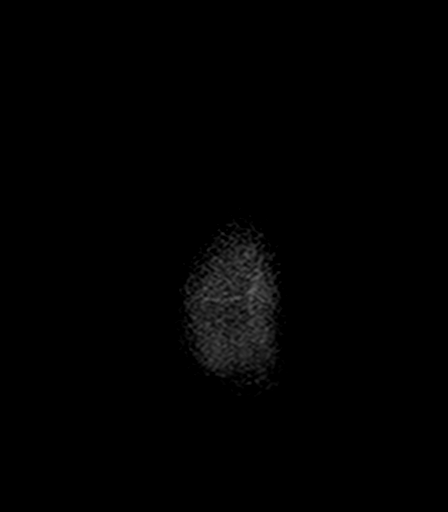

[Series 14: T2 · coronal · 5.0mm · 0.72mm/px · 4 of 28 slices shown (2 of 2)]
[im 1/28]
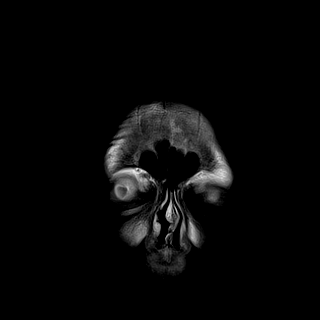
[im 10/28]
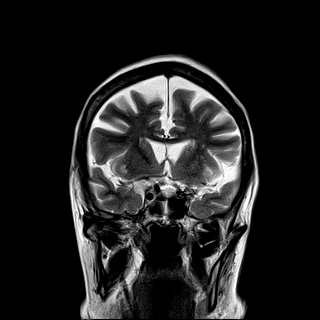
[im 19/28]
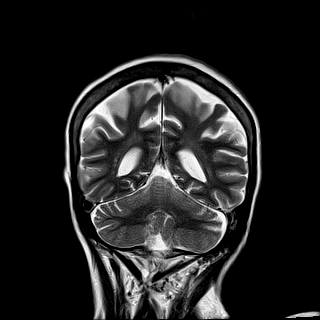
[im 28/28]
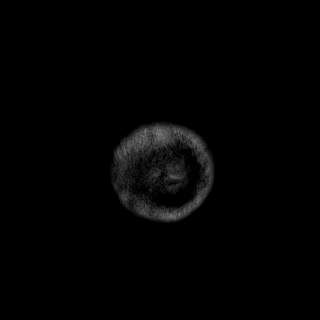

[41 of 48 positions shown; findings below may reference images not displayed]

FINDINGS: Brain: There are multiple foci of diffusion restriction with
associated T2/FLAIR signal abnormality in the right basal ganglia,
with the largest focus in the right lentiform nucleus corresponding
to the hypodensity seen on the CT head dated 1 day prior. There are
two punctate foci of diffusion restriction in the left basal
ganglia. Findings consistent with evolving early subacute infarcts.
There is no associated hemorrhage. There is an additional remote
lacunar infarct in the right lentiform nucleus.

There is no evidence of acute intracranial hemorrhage or extra-axial
fluid collection.

There is mild global parenchymal volume loss. Additional foci of
FLAIR signal abnormality in the subcortical and periventricular
white matter are nonspecific but likely reflects sequela of mild
chronic white matter microangiopathy.

There is no solid mass lesion.  There is no midline shift.

Vascular: Normal flow voids.

Skull and upper cervical spine: Normal marrow signal.

Sinuses/Orbits: The imaged paranasal sinuses are clear. The globes
and orbits are unremarkable.

Other: None.
IMPRESSION: Multiple small evolving early subacute infarcts in the bilateral
basal ganglia, right more than left, with the largest focus in the
right lentiform nucleus.

## 2021-09-03 MED ORDER — ATORVASTATIN CALCIUM 40 MG PO TABS
80.0000 mg | ORAL_TABLET | Freq: Every day | ORAL | Status: DC
  Administered 2021-09-03: 80 mg via ORAL
  Filled 2021-09-03: qty 2

## 2021-09-03 MED ORDER — ASPIRIN 325 MG PO TABS: 325.0000 mg | ORAL_TABLET | Freq: Every day | ORAL | Status: DC

## 2021-09-03 MED ORDER — CLOPIDOGREL BISULFATE 75 MG PO TABS
75.0000 mg | ORAL_TABLET | Freq: Every day | ORAL | Status: DC
  Administered 2021-09-04: 75 mg via ORAL
  Filled 2021-09-03: qty 1

## 2021-09-03 MED ORDER — SODIUM CHLORIDE 0.9 % IV SOLN: INTRAVENOUS | Status: DC

## 2021-09-03 MED ORDER — ASPIRIN 300 MG RE SUPP
300.0000 mg | Freq: Every day | RECTAL | Status: DC
  Administered 2021-09-03: 300 mg via RECTAL
  Filled 2021-09-03 (×2): qty 1

## 2021-09-03 MED ORDER — HALOPERIDOL LACTATE 5 MG/ML IJ SOLN: 1.0000 mg | Freq: Four times a day (QID) | INTRAMUSCULAR | Status: DC | PRN

## 2021-09-03 NOTE — Progress Notes (Signed)
ASSUMPTION OF CARE NOTE   09/03/2021 4:34 PM  Claudia Keller was seen and examined.  The H&P by the admitting provider, orders, imaging was reviewed.  Please see new orders.  Will continue to follow.   Impression  Acute CVA - Continue rectal aspirin until SLP eval completed.  Continue full stroke work up.  Continue permissive hypertension in setting of acute CVA.   Dyslipidemia - Added atorvastatin 80 mg daily .   Hypothyroidism - markedly elevated TSH, follow up free T4, continue levothyroxine, ( I doubt she has been taking it regularly) Vascular Dementia - delirium precautions ordered.  Type 2 DM - continue SSI coverage and frequent CBG monitoring.  Tobacco - nicotine patch ordered.  Pt is too confused to counsel at this time.   COPD - stable on bronchodilators.  Essential hypertension - uncontrolled, for now, allow permissive HTN, slowly normalize BP over next week.   Family communication: I updated son at bedside in person 10/27.     Vitals:   09/03/21 1500 09/03/21 1552  BP: (!) 187/88 (!) 180/88  Pulse: 90 89  Resp: 14 15  Temp:  97.6 F (36.4 C)  SpO2: 97% 99%    Results for orders placed or performed during the hospital encounter of 09/02/21  Resp Panel by RT-PCR (Flu A&B, Covid) Nasopharyngeal Swab   Specimen: Nasopharyngeal Swab; Nasopharyngeal(NP) swabs in vial transport medium  Result Value Ref Range   SARS Coronavirus 2 by RT PCR NEGATIVE NEGATIVE   Influenza A by PCR NEGATIVE NEGATIVE   Influenza B by PCR NEGATIVE NEGATIVE  Ethanol  Result Value Ref Range   Alcohol, Ethyl (B) <10 <10 mg/dL  Protime-INR  Result Value Ref Range   Prothrombin Time 12.1 11.4 - 15.2 seconds   INR 0.9 0.8 - 1.2  APTT  Result Value Ref Range   aPTT 29 24 - 36 seconds  CBC  Result Value Ref Range   WBC 9.4 4.0 - 10.5 K/uL   RBC 4.36 3.87 - 5.11 MIL/uL   Hemoglobin 13.8 12.0 - 15.0 g/dL   HCT 39.9 36.0 - 46.0 %   MCV 91.5 80.0 - 100.0 fL   MCH 31.7 26.0 - 34.0 pg   MCHC  34.6 30.0 - 36.0 g/dL   RDW 12.9 11.5 - 15.5 %   Platelets 201 150 - 400 K/uL   nRBC 0.0 0.0 - 0.2 %  Differential  Result Value Ref Range   Neutrophils Relative % 61 %   Neutro Abs 5.9 1.7 - 7.7 K/uL   Lymphocytes Relative 28 %   Lymphs Abs 2.6 0.7 - 4.0 K/uL   Monocytes Relative 8 %   Monocytes Absolute 0.8 0.1 - 1.0 K/uL   Eosinophils Relative 1 %   Eosinophils Absolute 0.1 0.0 - 0.5 K/uL   Basophils Relative 1 %   Basophils Absolute 0.1 0.0 - 0.1 K/uL   Immature Granulocytes 1 %   Abs Immature Granulocytes 0.06 0.00 - 0.07 K/uL  Comprehensive metabolic panel  Result Value Ref Range   Sodium 132 (L) 135 - 145 mmol/L   Potassium 4.3 3.5 - 5.1 mmol/L   Chloride 98 98 - 111 mmol/L   CO2 23 22 - 32 mmol/L   Glucose, Bld 164 (H) 70 - 99 mg/dL   BUN 16 8 - 23 mg/dL   Creatinine, Ser 1.20 (H) 0.44 - 1.00 mg/dL   Calcium 9.7 8.9 - 10.3 mg/dL   Total Protein 6.1 (L) 6.5 - 8.1 g/dL  Albumin 3.5 3.5 - 5.0 g/dL   AST 18 15 - 41 U/L   ALT 17 0 - 44 U/L   Alkaline Phosphatase 47 38 - 126 U/L   Total Bilirubin 0.5 0.3 - 1.2 mg/dL   GFR, Estimated 49 (L) >60 mL/min   Anion gap 11 5 - 15  Urine rapid drug screen (hosp performed)  Result Value Ref Range   Opiates NONE DETECTED NONE DETECTED   Cocaine NONE DETECTED NONE DETECTED   Benzodiazepines NONE DETECTED NONE DETECTED   Amphetamines NONE DETECTED NONE DETECTED   Tetrahydrocannabinol NONE DETECTED NONE DETECTED   Barbiturates NONE DETECTED NONE DETECTED  Urinalysis, Routine w reflex microscopic Urine, Clean Catch  Result Value Ref Range   Color, Urine YELLOW YELLOW   APPearance CLEAR CLEAR   Specific Gravity, Urine 1.027 1.005 - 1.030   pH 6.0 5.0 - 8.0   Glucose, UA NEGATIVE NEGATIVE mg/dL   Hgb urine dipstick NEGATIVE NEGATIVE   Bilirubin Urine NEGATIVE NEGATIVE   Ketones, ur NEGATIVE NEGATIVE mg/dL   Protein, ur 100 (A) NEGATIVE mg/dL   Nitrite NEGATIVE NEGATIVE   Leukocytes,Ua NEGATIVE NEGATIVE   RBC / HPF 0-5 0 - 5  RBC/hpf   WBC, UA 0-5 0 - 5 WBC/hpf   Bacteria, UA NONE SEEN NONE SEEN   Squamous Epithelial / LPF 0-5 0 - 5  Brain natriuretic peptide  Result Value Ref Range   B Natriuretic Peptide 203.0 (H) 0.0 - 100.0 pg/mL  HIV Antibody (routine testing w rflx)  Result Value Ref Range   HIV Screen 4th Generation wRfx Non Reactive Non Reactive  Lipid panel  Result Value Ref Range   Cholesterol 275 (H) 0 - 200 mg/dL   Triglycerides 169 (H) <150 mg/dL   HDL 39 (L) >40 mg/dL   Total CHOL/HDL Ratio 7.1 RATIO   VLDL 34 0 - 40 mg/dL   LDL Cholesterol 202 (H) 0 - 99 mg/dL  Comprehensive metabolic panel  Result Value Ref Range   Sodium 134 (L) 135 - 145 mmol/L   Potassium 3.7 3.5 - 5.1 mmol/L   Chloride 98 98 - 111 mmol/L   CO2 29 22 - 32 mmol/L   Glucose, Bld 148 (H) 70 - 99 mg/dL   BUN 17 8 - 23 mg/dL   Creatinine, Ser 1.06 (H) 0.44 - 1.00 mg/dL   Calcium 9.8 8.9 - 10.3 mg/dL   Total Protein 6.4 (L) 6.5 - 8.1 g/dL   Albumin 3.6 3.5 - 5.0 g/dL   AST 15 15 - 41 U/L   ALT 16 0 - 44 U/L   Alkaline Phosphatase 52 38 - 126 U/L   Total Bilirubin 0.7 0.3 - 1.2 mg/dL   GFR, Estimated 57 (L) >60 mL/min   Anion gap 7 5 - 15  CBC  Result Value Ref Range   WBC 7.9 4.0 - 10.5 K/uL   RBC 4.54 3.87 - 5.11 MIL/uL   Hemoglobin 14.4 12.0 - 15.0 g/dL   HCT 42.7 36.0 - 46.0 %   MCV 94.1 80.0 - 100.0 fL   MCH 31.7 26.0 - 34.0 pg   MCHC 33.7 30.0 - 36.0 g/dL   RDW 13.2 11.5 - 15.5 %   Platelets 207 150 - 400 K/uL   nRBC 0.0 0.0 - 0.2 %  TSH  Result Value Ref Range   TSH 28.650 (H) 0.350 - 4.500 uIU/mL  Glucose, capillary  Result Value Ref Range   Glucose-Capillary 138 (H) 70 - 99 mg/dL  I-stat chem 8, ED  Result Value Ref Range   Sodium 133 (L) 135 - 145 mmol/L   Potassium 4.4 3.5 - 5.1 mmol/L   Chloride 99 98 - 111 mmol/L   BUN 15 8 - 23 mg/dL   Creatinine, Ser 1.20 (H) 0.44 - 1.00 mg/dL   Glucose, Bld 163 (H) 70 - 99 mg/dL   Calcium, Ion 1.26 1.15 - 1.40 mmol/L   TCO2 23 22 - 32 mmol/L    Hemoglobin 13.9 12.0 - 15.0 g/dL   HCT 41.0 36.0 - 46.0 %  CBG monitoring, ED  Result Value Ref Range   Glucose-Capillary 171 (H) 70 - 99 mg/dL  CBG monitoring, ED  Result Value Ref Range   Glucose-Capillary 146 (H) 70 - 99 mg/dL  CBG monitoring, ED  Result Value Ref Range   Glucose-Capillary 202 (H) 70 - 99 mg/dL  CBG monitoring, ED  Result Value Ref Range   Glucose-Capillary 76 70 - 99 mg/dL  ECHOCARDIOGRAM COMPLETE  Result Value Ref Range   Weight 2,451.2 oz   Height 64 in   BP 174/72 mmHg   AR max vel 2.29 cm2   AV Area VTI 2.64 cm2   AV Mean grad 4.0 mmHg   AV Peak grad 7.8 mmHg   Ao pk vel 1.40 m/s   AV Area mean vel 2.35 cm2   MV VTI 2.07 cm2   Area-P 1/2 2.75 cm2   S' Lateral 2.10 cm   Murvin Natal, MD [facility]  Extended time service: 34 minutes    09/02/2021  8:06 PM How to contact the [facility] Attending or Consulting provider Maybee or covering provider during after hours 7P -7A, for this patient?  Check the care team in [facility] and look for a) attending/consulting [facility] provider listed and b) the [facility] team listed Log into [email or web address] and use [facility]'s universal password to access. If you do not have the password, please contact the hospital operator. Locate the [facility] provider you are looking for under [facility] and page to a number that you can be directly reached. If you still have difficulty reaching the provider, please page the [facility] (Director on Call) for the Hospitalists listed on amion for assistance.

## 2021-09-03 NOTE — ED Notes (Signed)
Patient transported to MRI 

## 2021-09-03 NOTE — Plan of Care (Signed)
  Problem: Acute Rehab OT Goals (only OT should resolve) Goal: Pt. Will Perform Eating Flowsheets (Taken 09/03/2021 1005) Pt Will Perform Eating:  with supervision  sitting Goal: Pt. Will Perform Grooming Flowsheets (Taken 09/03/2021 1005) Pt Will Perform Grooming:  with min assist  sitting  standing  with adaptive equipment Goal: Pt. Will Perform Lower Body Bathing Flowsheets (Taken 09/03/2021 1005) Pt Will Perform Lower Body Bathing:  with min assist  with mod assist  sitting/lateral leans  with adaptive equipment Goal: Pt. Will Perform Upper Body Dressing Flowsheets (Taken 09/03/2021 1005) Pt Will Perform Upper Body Dressing:  with supervision  sitting Goal: Pt. Will Perform Lower Body Dressing Flowsheets (Taken 09/03/2021 1005) Pt Will Perform Lower Body Dressing:  with mod assist  sitting/lateral leans  with adaptive equipment Goal: Pt. Will Transfer To Toilet Flowsheets (Taken 09/03/2021 1005) Pt Will Transfer to Toilet:  with min assist  stand pivot transfer  bedside commode Goal: Pt/Caregiver Will Perform Home Exercise Program Flowsheets (Taken 09/03/2021 1005) Pt/caregiver will Perform Home Exercise Program:  Increased ROM  Increased strength  Left upper extremity  With minimal assist Goal: OT Additional ADL Goal #1 Flowsheets (Taken 09/03/2021 1005) Additional ADL Goal #1: Pt will demosntrate improved visual perceptual and motor skills by tracking and identifying items in L field of view with minimal cuing 75% of the time.  Aariv Medlock OT, MOT

## 2021-09-03 NOTE — Evaluation (Signed)
Occupational Therapy Evaluation Patient Details Name: Claudia Keller MRN: [id number] DOB: December 03, 1950 Today's Date: 09/03/2021   History of Present Illness Claudia Keller  is a 70 y.o. female, with history of COPD, hypertension, hypothyroidism, mixed hyperlipidemia, tobacco use, diabetes mellitus type 2, PTSD, and more presents to ED with chief complaint of fall.  Apparently EMS was called out to her house for the second time today around [phone] when patient fell and hit her life alert button.  Patient is quite confused at the time of my exam.  She keeps reporting that she came in because of her sugar.  She is not sure if she is hyper or hypoglycemic per her report.  We asked her questions about fall or unilateral weakness, she just says "my sugar my sugar."  Per chart review patient had sudden onset of left-sided weakness and dysarthria-last known well 7:10 PM.  She is not a candidate for thrombolytic therapy as she had a subacute stroke on CT.  Telemetry neuro recommends permissive hypertension with treatment of blood pressure of 220/120 or greater.   Clinical Impression   OT/PT co-evaluating this date. Pt was a poor historian due to difficulty answering questions about prior living arrangement. Pt demonstrated difficulty following one step commands. When completing bed mobility pt requires moderate assist with noted impulsive behavior attempting to get back in supine position. Mod to max A needed for stand pivot transfer. Pt able to grasp RW but demonstrates active use of L UE below 50% of available range for shoulder and elbow movement. Definite L side neglect observed. Pt not appropriate for formal vision testing due to cognitive deficits, but pt rotates head to R side and is unable to track and object to left field of view. Pt will benefit from continued OT in the hospital and recommended venue below to increase strength, balance, and endurance for safe ADL's.        Recommendations for follow up  therapy are one component of a multi-disciplinary discharge planning process, led by the attending physician.  Recommendations may be updated based on patient status, additional functional criteria and insurance authorization.   Follow Up Recommendations  Skilled nursing-short term rehab (<3 hours/day)    Assistance Recommended at Discharge Frequent or constant Supervision/Assistance  Functional Status Assessment  Patient has had a recent decline in their functional status and demonstrates the ability to make significant improvements in function in a reasonable and predictable amount of time.  Equipment Recommendations  None recommended by OT           Precautions / Restrictions Precautions Precautions: Fall Restrictions Weight Bearing Restrictions: No      Mobility Bed Mobility Overal bed mobility: Needs Assistance Bed Mobility: Supine to Sit;Sit to Supine     Supine to sit: Mod assist Sit to supine: Mod assist;Max assist   General bed mobility comments: Pt demonstrates labored movement with difficulty following commands for bed mobility.    Transfers Overall transfer level: Needs assistance Equipment used: Rolling walker (2 wheels) Transfers: Sit to/from [facility] Sit to Stand: Mod assist;Max assist Stand pivot transfers: Mod assist;Max assist         General transfer comment: Able to grip RW; unsteady in standing; unable to step forward or backward with L LE without assist.      Balance Overall balance assessment: Needs assistance Sitting-balance support: No upper extremity supported;Feet supported Sitting balance-Leahy Scale: Poor Sitting balance - Comments: poor with posterior lean Postural control: Posterior lean Standing balance support: Bilateral  upper extremity supported;During functional activity Standing balance-Leahy Scale: Poor Standing balance comment: using RW                           ADL either performed or  assessed with clinical judgement   ADL Overall ADL's : Needs assistance/impaired                 Upper Body Dressing : Maximal assistance;Moderate assistance;Bed level Upper Body Dressing Details (indicate cue type and reason): Donning gown partially; cognition limiting UB dressing Lower Body Dressing: Total assistance;Bed level Lower Body Dressing Details (indicate cue type and reason): Donning socks at bed level. Toilet Transfer: Moderate assistance;Maximal assistance;Stand-pivot;Rolling walker (2 wheels) Toilet Transfer Details (indicate cue type and reason): simulated via EOB to chair and back         Functional mobility during ADLs: Maximal assistance;Rolling walker (2 wheels)       Vision Baseline Vision/History: 0 No visual deficits (Per document review.) Ability to See in Adequate Light: 3 Highly impaired Patient Visual Report: Peripheral vision impairment;Other (comment) (Pt did not report this; clearly observed.) Vision Assessment?: Yes Alignment/Gaze Preference: Head turned;Gaze right Tracking/Visual Pursuits: Other (comment) (Pt unable to track in any quadrant today.) Visual Fields: Left visual field deficit;Other (comment) (Pt demonstrates sings of L upper and lower quadrant visual neglect with head rotation to R side.)                Pertinent Vitals/Pain Pain Assessment: Faces Faces Pain Scale: No hurt     Hand Dominance Right   Extremity/Trunk Assessment Upper Extremity Assessment Upper Extremity Assessment: LUE deficits/detail;Difficult to assess due to impaired cognition (R UE appeard to be [facility] per observation of functional use.) LUE Deficits / Details: 2+/5 shoulder flexion; 2-/5 elbow flexion; 3+/5 grip; Pt demonstrates flaccidity but is able to move L UE partially and grip RW. LUE:  (Limited due to cognitive deficits.) LUE Coordination: decreased fine motor;decreased gross motor   Lower Extremity Assessment Lower Extremity Assessment: Defer to  PT evaluation   Cervical / Trunk Assessment Cervical / Trunk Assessment: Normal   Communication Communication Communication: Expressive difficulties;Receptive difficulties   Cognition Arousal/Alertness: Lethargic Behavior During Therapy: Impulsive Overall Cognitive Status: No family/caregiver present to determine baseline cognitive functioning (Per document review pt demonstrates altered mental status.)                                 General Comments: Pt demonstrates difficulty following commands. Impulsive this date attempting to return to supine position in bed.                      Home Living Family/patient expects to be discharged to:: Private residence Living Arrangements: Alone Available Help at Discharge: Family;Available PRN/intermittently Type of Home: Apartment Home Access: Elevator     Home Layout: One level     Bathroom Shower/Tub: Teacher, early years/pre: Handicapped height Bathroom Accessibility: Yes   Home Equipment: Cane - single point;Rollator (4 wheels);BSC;Shower seat;Grab bars - tub/shower;Rolling Walker (2 wheels)   Additional Comments: History taken from prior admission earlier this year due to pt defiicts in cognition this date.      Prior Functioning/Environment Prior Level of Function : Needs assist             Mobility Comments: Pt reported using a RW at baseline. ADLs Comments: Pt reported she  did not need help with toileting but was asisisted with bathing and dressing. Assisted for IADL's.        OT Problem List: Decreased strength;Decreased range of motion;Decreased activity tolerance;Impaired balance (sitting and/or standing);Impaired vision/perception;Decreased coordination;Decreased cognition;Decreased safety awareness;Impaired tone;Impaired UE functional use      OT Treatment/Interventions: Self-care/ADL training;Therapeutic exercise;Therapeutic activities;Cognitive  remediation/compensation;Visual/perceptual remediation/compensation;Patient/family education;Neuromuscular education;Energy conservation;DME and/or AE instruction;Manual therapy;Modalities;Balance training    OT Goals(Current goals can be found in the care plan section) Acute Rehab OT Goals Patient Stated Goal: Pt seemed open to rehab. OT Goal Formulation: With patient Time For Goal Achievement: 09/17/21 Potential to Achieve Goals: Fair  OT Frequency: Min 2X/week               Co-evaluation PT/OT/SLP Co-Evaluation/Treatment: Yes Reason for Co-Treatment: Complexity of the patient's impairments (multi-system involvement)   OT goals addressed during session: ADL's and self-care;Strengthening/ROM      AM-PAC OT "6 Clicks" Daily Activity     Outcome Measure Help from another person eating meals?: A Lot Help from another person taking care of personal grooming?: Total Help from another person toileting, which includes using toliet, bedpan, or urinal?: A Lot Help from another person bathing (including washing, rinsing, drying)?: A Lot Help from another person to put on and taking off regular upper body clothing?: A Lot Help from another person to put on and taking off regular lower body clothing?: Total 6 Click Score: 10   End of Session Equipment Utilized During Treatment: Rolling walker (2 wheels)  Activity Tolerance: Patient tolerated treatment well Patient left: in bed;with call bell/phone within reach  OT Visit Diagnosis: Unsteadiness on feet (R26.81);Other abnormalities of gait and mobility (R26.89);Muscle weakness (generalized) (M62.81);History of falling (Z91.81);Other symptoms and signs involving cognitive function;Other symptoms and signs involving the nervous system (R29.898);Cognitive communication deficit (R41.841);Hemiplegia and hemiparesis Symptoms and signs involving cognitive functions: Cerebral infarction Hemiplegia - Right/Left: Left Hemiplegia -  dominant/non-dominant: Non-Dominant Hemiplegia - caused by: Cerebral infarction                Time: 2694-8546 OT Time Calculation (min): 13 min Charges:  OT General Charges $OT Visit: 1 Visit OT Evaluation $OT Eval Moderate Complexity: 1 Mod  Haliyah Fryman OT, MOT  [employer] 09/03/2021, 10:01 AM

## 2021-09-03 NOTE — Consult Note (Signed)
[place] A. Claudia Laughter, MD     [email or web address]          Claudia Keller is an 70 y.o. female.   ASSESSMENT/PLAN: THE PATIENT PRESENTS WITH ENCEPHALOPATHY AND FALLING: Etiologies most likely ischemic strokes. Given the presentation simultaneously this is suggestive for cardioembolic stroke. The patient has brief episode of atrial fibrillation on last hospitalization which is also concerning. We may elect to treat her with anticoagulation but at minimum she should get a 30 day event monitor and TEE. Confusion out of proportion to the infarcts seen imaging: Dementia workup is suggested along with an EEG.       This is a 89 left-handed white female is admitted the hospital for falling And confusion.  She is quite restless and cannot a history including review of systems. She does report any problems at this time including pain, focal numbness or weakness. She is trying to get the bed. Seems confused what is happening with medical condition.       DC SUMM   12-2020 Principal Problem:   Pneumonia due to COVID-19 virus Active Problems:   Closed fracture of metatarsal bone   Hypercholesterolemia   Tobacco abuse   Essential hypertension, benign   Primary hypothyroidism   Noncompliance with medication regimen   Acute respiratory failure with hypoxia (HCC)   Leukocytosis   Hyperglycemia due to diabetes mellitus (HCC)   Hypoalbuminemia   Elevated AST (SGOT)   Obesity (BMI 30-39.9)   COPD (chronic obstructive pulmonary disease) (HCC)   GERD (gastroesophageal reflux disease)   Diabetic neuropathy (HCC)   Depression   Anxiety   Acute respiratory disease due to COVID-19 virus   Acute hypoxic respiratory failure secondary to COVID-19 pneumonia -Currently requiring 2 L of oxygen -Patient was treated with steroids, baricitinib and remdesivir -Overall inflammatory markers have improved and respiratory status has stabilized -She will complete a total 10 days of  steroids -She is able to ambulate without difficulty   Transient atrial fibrillation -This occurred in the setting of acute hypoxic respiratory failure -Patient was treated with diltiazem and converted to sinus rhythm -She did not have any recurrence of atrial fibrillation -If she were to have any recurrence of atrial fib, will need to consider anticoagulation   Hypothyroidism -TSH was checked and found to be low at 0.049 -Considering that she was having transient atrial fibrillation, her levothyroxine dose was held -This is being resumed at reduced dose and she will need repeat thyroid studies in the next 4 weeks   Diabetes -A1c of 10.2 -She is currently on Metformin, linagliptin, basal insulin and NovoLog -Basal insulin dose has been reduced based on blood sugars -Continue to follow serial blood sugars   Bipolar disorder -Continue Seroquel, Paxil, Remeron, Depakote   Hypertension -Blood pressure stable on lisinopril and diltiazem   Fracture of second and third phalanges -Treat conservatively with postop shoe -Can consider follow-up with orthopedics if desired   Tobacco use -Patient is requesting nicotine gum   Generalized weakness -Patient seen by physical therapy with plans for skilled nursing facility.  She is agreeable to transfer.         Blood pressure (!) 180/88, pulse 89, temperature 97.6 F (36.4 C), temperature source Oral, resp. rate 15, height 5\' 4"  (1.626 m), weight 69.5 kg, SpO2 99 %.  GENERAL:  Thin and somewhat restless; she is trying to get the bed  HEENT:  neck is supple no trauma noted.  ABDOMEN: soft  EXTREMITIES: No edema  BACK:  this is normal  SKIN: Normal by inspection.    MENTAL STATUS:  She is awake and alert and cooperates with evaluation. She knows she is in the hospital in years 2022 and that is August. She is unable to state why she is in the hospital any medical history. Sometimes she thinks she is at home. She seems to  clearly have some difficulties with comprehension following commands.  She has difficulties naming about objects tested at the bedside. There is paraphasic errors and perseveration suggesting moderate aphasia.  CRANIAL NERVES: Pupils are equal, round and reactive to light and accomodation; extra ocular movements are full, there is no significant nystagmus; visual fields are full; upper and lower facial muscles are normal in strength and symmetric, there is no flattening of the nasolabial folds; tongue is midline; uvula is midline; shoulder elevation is normal.  MOTOR:  She has antigravity strength throughout and exact strength is not obtainable due to the difficulties following commands.  There is mild drift of the left upper extremity and severe drift of the left lower extremity. Left lower extremity is abducted externally rotated.  COORDINATION: Left finger to nose is normal, right finger to nose is normal, No rest tremor; no intention tremor; no postural tremor; no bradykinesia.  REFLEXES: Deep tendon reflexes are symmetrical and normal. Plantar reflexes are flexor bilaterally.   SENSATION: Normal to pain.     Past Medical History:  Diagnosis Date   Arthritis    COPD (chronic obstructive pulmonary disease) ([place])    Diverticulosis    Essential hypertension, benign    Hx of colonic polyps    Hypothyroidism    IBS (irritable bowel syndrome)    Irritable bowel syndrome    Ketoacidosis    Mixed hyperlipidemia    PTSD (post-traumatic stress disorder)    S/P colonoscopy Jan 2011   RMR: pancolonic diverticula, polypoid rectal mucosa with  prominent lymphoid aggregates, repeat in Jan 2016    Shortness of breath dyspnea    Stress incontinence, female    Thyroid disease    Tobacco use    Type 2 diabetes mellitus ([place])     Past Surgical History:  Procedure Laterality Date   Bilateral tubal ligation     BREAST BIOPSY Left 10/16/2014   negative   CHOLECYSTECTOMY  09/11/2012   Procedure:  LAPAROSCOPIC CHOLECYSTECTOMY;  Surgeon: Jamesetta So, MD;  Location: [facility];  Service: General;  Laterality: N/A;   COLONOSCOPY  11/17/2009   Dr. Gala Romney: normal rectum, pancolonic diverticula, polyps benign, no adenomas.    COLONOSCOPY N/A 02/12/2015   Dr. Gala Romney: colonic diverticulosis, multiple colonic polyps (tubular adenomas), negative segmental biopsies. Surveillance in 2021   ESOPHAGOGASTRODUODENOSCOPY  11/23/2011   Dr. Gala Romney: Erosive reflux esophagitis; small hiatal hernia; esophagus dilated empirically   MALONEY DILATION  11/23/2011   Procedure: Venia Minks DILATION;  Surgeon: Daneil Dolin, MD;  Location: [facility];  Service: Endoscopy;  Laterality: N/A;   SKIN LESION EXCISION     on buttocks   TUBAL LIGATION      Family History  Problem Relation Age of Onset   Schizophrenia Brother    Stroke Brother    Cancer Mother        unsure what type   Heart disease Father    Heart attack Father    Coronary artery disease Other    Diabetes type II Other    Cancer Son        bladder   Colon cancer Neg  Hx     Social History:  reports that she has been smoking cigarettes. She has a 86.00 pack-year smoking history. She has never used smokeless tobacco. She reports that she does not drink alcohol and does not use drugs.  Allergies: No Known Allergies  Medications: Prior to Admission medications   Medication Sig Start Date End Date Taking? Authorizing Provider  albuterol (VENTOLIN HFA) 108 (90 Base) MCG/ACT inhaler Inhale 2 puffs into the lungs every 4 (four) hours as needed for wheezing or shortness of breath. 12/03/20  Yes Manuella Ghazi, Pratik D, DO  atorvastatin (LIPITOR) 20 MG tablet Take 1 tablet (20 mg total) by mouth at bedtime. 12/12/20  Yes Kathie Dike, MD  Calcium Carb-Cholecalciferol (OYSTER SHELL CALCIUM W/D) 500-200 MG-UNIT TABS Take 1 tablet by mouth 2 (two) times daily. 11/10/20  Yes [provider]  cetirizine (ZYRTEC) 10 MG tablet Take 10 mg by mouth daily. 11/10/20  Yes  [provider]  colestipol (COLESTID) 1 g tablet TAKE 2 TABLETS DAILY FOR DIARRHEA. DO NOT TAKE WITHIN 2 HOURS OF OTHER ORAL MEDICATIONS. HOLD FOR CONSTIPATION. 05/23/20  Yes Carlis Stable, NP  Continuous Blood Gluc Sensor (FREESTYLE LIBRE 2 SENSOR) MISC 1 Device by Does not apply route every 14 (fourteen) days. 08/27/21  Yes Renato Shin, MD  diltiazem (CARDIZEM CD) 120 MG 24 hr capsule Take 1 capsule (120 mg total) by mouth daily. 12/12/20 12/12/21 Yes Kathie Dike, MD  divalproex (DEPAKOTE ER) 500 MG 24 hr tablet Take 2 tablets (1,000 mg total) by mouth every evening. 02/20/21  Yes Norman Clay, MD  divalproex (DEPAKOTE) 250 MG DR tablet Take 1 tablet (250 mg total) by mouth daily. 02/20/21  Yes Hisada, Elie Goody, MD  furosemide (LASIX) 20 MG tablet Take 20 mg by mouth daily.   Yes [provider]  gabapentin (NEURONTIN) 100 MG capsule Take 100 mg by mouth 3 (three) times daily.   Yes [provider]  guaiFENesin-dextromethorphan (ROBITUSSIN DM) 100-10 MG/5ML syrup Take 10 mLs by mouth every 4 (four) hours as needed for cough. 12/03/20  Yes Shah, Pratik D, DO  Insulin NPH, Human,, Isophane, (NOVOLIN N FLEXPEN) 100 UNIT/ML Kiwkpen Inject 60 Units into the skin every morning. 08/27/21  Yes Renato Shin, MD  levothyroxine (SYNTHROID) 100 MCG tablet Take 2 tablets (200 mcg total) by mouth daily before breakfast. 12/12/20  Yes Kathie Dike, MD  lisinopril (PRINIVIL,ZESTRIL) 20 MG tablet Take 20 mg by mouth daily.   Yes [provider]  mirtazapine (REMERON) 7.5 MG tablet Take 1 tablet (7.5 mg total) by mouth at bedtime. 02/20/21  Yes Norman Clay, MD  nicotine polacrilex (NICORETTE) 4 MG gum Take 1 each (4 mg total) by mouth as needed for smoking cessation. 12/12/20  Yes Kathie Dike, MD  ondansetron (ZOFRAN) 4 MG tablet Take 1 tablet (4 mg total) by mouth every 8 (eight) hours as needed for nausea or vomiting. 10/17/18  Yes Walden Field A, NP  pantoprazole (PROTONIX) 40 MG  tablet TAKE 1 TABLET BY MOUTH ONCE DAILY 30 MINTUES BEFORE BREAKFAST. Patient taking differently: Take 40 mg by mouth daily before breakfast. 05/23/20  Yes Carlis Stable, NP  PARoxetine (PAXIL) 40 MG tablet Take 1 tablet (40 mg total) by mouth daily. 02/20/21  Yes Norman Clay, MD  QUEtiapine (SEROQUEL) 100 MG tablet Take 1 tablet (100 mg total) by mouth at bedtime. 02/20/21  Yes Norman Clay, MD  glucose blood (QUINTET BLOOD GLUCOSE TEST) test strip Check blood sugar 2 times daily E11.65 08/12/21  Renato Shin, MD    Scheduled Meds:   stroke: mapping our early stages of recovery book   Does not apply Once   aspirin  300 mg Rectal Daily   atorvastatin  80 mg Oral QHS   colestipol  1 g Oral Daily   divalproex  1,000 mg Oral QPM   divalproex  250 mg Oral Daily   gabapentin  100 mg Oral TID   heparin  5,000 Units Subcutaneous Q8H   insulin aspart  0-15 Units Subcutaneous TID WC   insulin aspart  0-5 Units Subcutaneous QHS   insulin detemir  45 Units Subcutaneous Daily   levothyroxine  200 mcg Oral QAC breakfast   mirtazapine  7.5 mg Oral QHS   PARoxetine  40 mg Oral Daily   QUEtiapine  100 mg Oral QHS   Continuous Infusions:  sodium chloride 50 mL/hr at 09/03/21 1557   PRN Meds:.acetaminophen **OR** acetaminophen (TYLENOL) oral liquid 160 mg/5 mL **OR** acetaminophen, haloperidol lactate, senna-docusate     Results for orders placed or performed during the hospital encounter of 09/02/21 (from the past 48 hour(s))  Ethanol     Status: None   Collection Time: 09/02/21  8:18 PM  Result Value Ref Range   Alcohol, Ethyl (B) <10 <10 mg/dL    Comment: Performed at [facility], [address], [place], [place] [address]  Protime-INR     Status: None   Collection Time: 09/02/21  8:18 PM  Result Value Ref Range   Prothrombin Time 12.1 11.4 - 15.2 seconds   INR 0.9 0.8 - 1.2    Comment: (NOTE) INR goal varies based on device and disease states. Performed at [facility], [address], [place], [place] [address]   APTT     Status: None   Collection Time: 09/02/21  8:18 PM  Result Value Ref Range   aPTT 29 24 - 36 seconds    Comment: Performed at [facility], [address] [place], [place] [address]  CBC     Status: None   Collection Time: 09/02/21  8:18 PM  Result Value Ref Range   WBC 9.4 4.0 - 10.5 K/uL   RBC 4.36 3.87 - 5.11 MIL/uL   Hemoglobin 13.8 12.0 - 15.0 g/dL   HCT 39.9 36.0 - 46.0 %   MCV 91.5 80.0 - 100.0 fL   MCH 31.7 26.0 - 34.0 pg   MCHC 34.6 30.0 - 36.0 g/dL   RDW 12.9 11.5 - 15.5 %   Platelets 201 150 - 400 K/uL   nRBC 0.0 0.0 - 0.2 %    Comment: Performed at [facility], [address] [place], [place] [address]  Differential     Status: None   Collection Time: 09/02/21  8:18 PM  Result Value Ref Range   Neutrophils Relative % 61 %   Neutro Abs 5.9 1.7 - 7.7 K/uL   Lymphocytes Relative 28 %   Lymphs Abs 2.6 0.7 - 4.0 K/uL   Monocytes Relative 8 %   Monocytes Absolute 0.8 0.1 - 1.0 K/uL   Eosinophils Relative 1 %   Eosinophils Absolute 0.1 0.0 - 0.5 K/uL   Basophils Relative 1 %   Basophils Absolute 0.1 0.0 - 0.1 K/uL   Immature Granulocytes 1 %   Abs Immature Granulocytes 0.06 0.00 - 0.07 K/uL    Comment: Performed at [facility], [address] [place],  [address]  Comprehensive metabolic panel     Status: Abnormal   Collection  Time: 09/02/21  8:18 PM  Result Value Ref Range   Sodium 132 (L) 135 - 145 mmol/L   Potassium 4.3 3.5 - 5.1 mmol/L   Chloride 98 98 - 111 mmol/L   CO2 23 22 - 32 mmol/L   Glucose, Bld 164 (H) 70 - 99 mg/dL    Comment: Glucose reference range applies only to samples taken after fasting for at least 8 hours.   BUN 16 8 - 23 mg/dL   Creatinine, Ser 1.20 (H) 0.44 - 1.00 mg/dL   Calcium 9.7 8.9 - 10.3 mg/dL   Total Protein 6.1 (L) 6.5 - 8.1 g/dL   Albumin 3.5 3.5 - 5.0 g/dL   AST 18 15 - 41 U/L   ALT 17 0 - 44 U/L   Alkaline Phosphatase 47 38 - 126 U/L   Total Bilirubin 0.5 0.3 - 1.2 mg/dL   GFR,  Estimated 49 (L) >60 mL/min    Comment: (NOTE) Calculated using the CKD-EPI Creatinine Equation (2021)    Anion gap 11 5 - 15    Comment: Performed at [facility], [address], [place], [place] [address]  Brain natriuretic peptide     Status: Abnormal   Collection Time: 09/02/21  8:18 PM  Result Value Ref Range   B Natriuretic Peptide 203.0 (H) 0.0 - 100.0 pg/mL    Comment: Performed at [facility], [address] [place],  [address]  CBG monitoring, ED     Status: Abnormal   Collection Time: 09/02/21  8:23 PM  Result Value Ref Range   Glucose-Capillary 171 (H) 70 - 99 mg/dL    Comment: Glucose reference range applies only to samples taken after fasting for at least 8 hours.  I-stat chem 8, ED     Status: Abnormal   Collection Time: 09/02/21  8:24 PM  Result Value Ref Range   Sodium 133 (L) 135 - 145 mmol/L   Potassium 4.4 3.5 - 5.1 mmol/L   Chloride 99 98 - 111 mmol/L   BUN 15 8 - 23 mg/dL   Creatinine, Ser 1.20 (H) 0.44 - 1.00 mg/dL   Glucose, Bld 163 (H) 70 - 99 mg/dL    Comment: Glucose reference range applies only to samples taken after fasting for at least 8 hours.   Calcium, Ion 1.26 1.15 - 1.40 mmol/L   TCO2 23 22 - 32 mmol/L   Hemoglobin 13.9 12.0 - 15.0 g/dL   HCT 41.0 36.0 - 46.0 %  Resp Panel by RT-PCR (Flu A&B, Covid) Nasopharyngeal Swab     Status: None   Collection Time: 09/02/21  8:30 PM   Specimen: Nasopharyngeal Swab; Nasopharyngeal(NP) swabs in vial transport medium  Result Value Ref Range   SARS Coronavirus 2 by RT PCR NEGATIVE NEGATIVE    Comment: (NOTE) SARS-CoV-2 target nucleic acids are NOT DETECTED.  The SARS-CoV-2 RNA is generally detectable in upper respiratory specimens during the acute phase of infection. The lowest concentration of SARS-CoV-2 viral copies this assay can detect is 138 copies/mL. A negative result does not preclude SARS-Cov-2 infection and should not be used as the sole basis for treatment or other patient management  decisions. A negative result may occur with  improper specimen collection/handling, submission of specimen other than nasopharyngeal swab, presence of viral mutation(s) within the areas targeted by this assay, and inadequate number of viral copies(<138 copies/mL). A negative result must be combined with clinical observations, patient history, and epidemiological information. The expected result is Negative.  Fact Sheet for  Patients:  EntrepreneurPulse.com.au  Fact Sheet for Healthcare Providers:  IncredibleEmployment.be  This test is no t yet approved or cleared by the [place] FDA and  has been authorized for detection and/or diagnosis of SARS-CoV-2 by FDA under an Emergency Use Authorization (EUA). This EUA will remain  in effect (meaning this test can be used) for the duration of the COVID-19 declaration under Section 564(b)(1) of the Act, 21 U.S.C.section 360bbb-3(b)(1), unless the authorization is terminated  or revoked sooner.       Influenza A by PCR NEGATIVE NEGATIVE   Influenza B by PCR NEGATIVE NEGATIVE    Comment: (NOTE) The Xpert Xpress SARS-CoV-2/FLU/RSV plus assay is intended as an aid in the diagnosis of influenza from Nasopharyngeal swab specimens and should not be used as a sole basis for treatment. Nasal washings and aspirates are unacceptable for Xpert Xpress SARS-CoV-2/FLU/RSV testing.  Fact Sheet for Patients: EntrepreneurPulse.com.au  Fact Sheet for Healthcare Providers: IncredibleEmployment.be  This test is not yet approved or cleared by the [place] FDA and has been authorized for detection and/or diagnosis of SARS-CoV-2 by FDA under an Emergency Use Authorization (EUA). This EUA will remain in effect (meaning this test can be used) for the duration of the COVID-19 declaration under Section 564(b)(1) of the Act, 21 U.S.C. section 360bbb-3(b)(1), unless the authorization  is terminated or revoked.  Performed at [facility], [address] [place], [place] [address]   CBG monitoring, ED     Status: Abnormal   Collection Time: 09/02/21 11:22 PM  Result Value Ref Range   Glucose-Capillary 146 (H) 70 - 99 mg/dL    Comment: Glucose reference range applies only to samples taken after fasting for at least 8 hours.  HIV Antibody (routine testing w rflx)     Status: None   Collection Time: 09/03/21  3:57 AM  Result Value Ref Range   HIV Screen 4th Generation wRfx Non Reactive Non Reactive    Comment: Performed at [facility], [address] [place], [place] [address]  Lipid panel     Status: Abnormal   Collection Time: 09/03/21  3:57 AM  Result Value Ref Range   Cholesterol 275 (H) 0 - 200 mg/dL   Triglycerides 169 (H) <150 mg/dL   HDL 39 (L) >40 mg/dL   Total CHOL/HDL Ratio 7.1 RATIO   VLDL 34 0 - 40 mg/dL   LDL Cholesterol 202 (H) 0 - 99 mg/dL    Comment:        Total Cholesterol/HDL:CHD Risk Coronary Heart Disease Risk Table                     Men   Women  1/2 Average Risk   3.4   3.3  Average Risk       5.0   4.4  2 X Average Risk   9.6   7.1  3 X Average Risk  23.4   11.0        Use the calculated Patient Ratio above and the CHD Risk Table to determine the patient's CHD Risk.        ATP III CLASSIFICATION (LDL):  <100     mg/dL   Optimal  100-129  mg/dL   Near or Above                    Optimal  130-159  mg/dL   Borderline  160-189  mg/dL   High  >190  mg/dL   Very High Performed at [facility], [address], [place], [place] [address]   Comprehensive metabolic panel     Status: Abnormal   Collection Time: 09/03/21  3:57 AM  Result Value Ref Range   Sodium 134 (L) 135 - 145 mmol/L   Potassium 3.7 3.5 - 5.1 mmol/L   Chloride 98 98 - 111 mmol/L   CO2 29 22 - 32 mmol/L   Glucose, Bld 148 (H) 70 - 99 mg/dL    Comment: Glucose reference range applies only to samples taken after fasting for at least 8 hours.   BUN 17 8 - 23  mg/dL   Creatinine, Ser 1.06 (H) 0.44 - 1.00 mg/dL   Calcium 9.8 8.9 - 10.3 mg/dL   Total Protein 6.4 (L) 6.5 - 8.1 g/dL   Albumin 3.6 3.5 - 5.0 g/dL   AST 15 15 - 41 U/L   ALT 16 0 - 44 U/L   Alkaline Phosphatase 52 38 - 126 U/L   Total Bilirubin 0.7 0.3 - 1.2 mg/dL   GFR, Estimated 57 (L) >60 mL/min    Comment: (NOTE) Calculated using the CKD-EPI Creatinine Equation (2021)    Anion gap 7 5 - 15    Comment: Performed at [facility], [address], [place], [place] [address]  CBC     Status: None   Collection Time: 09/03/21  3:57 AM  Result Value Ref Range   WBC 7.9 4.0 - 10.5 K/uL   RBC 4.54 3.87 - 5.11 MIL/uL   Hemoglobin 14.4 12.0 - 15.0 g/dL   HCT 42.7 36.0 - 46.0 %   MCV 94.1 80.0 - 100.0 fL   MCH 31.7 26.0 - 34.0 pg   MCHC 33.7 30.0 - 36.0 g/dL   RDW 13.2 11.5 - 15.5 %   Platelets 207 150 - 400 K/uL   nRBC 0.0 0.0 - 0.2 %    Comment: Performed at [facility], [address], [place], [place] [address]  TSH     Status: Abnormal   Collection Time: 09/03/21  3:57 AM  Result Value Ref Range   TSH 28.650 (H) 0.350 - 4.500 uIU/mL    Comment: Performed by a 3rd Generation assay with a functional sensitivity of <=0.01 uIU/mL. Performed at [facility], [address] [place], [place] [address]   Urine rapid drug screen (hosp performed)     Status: None   Collection Time: 09/03/21  7:12 AM  Result Value Ref Range   Opiates NONE DETECTED NONE DETECTED   Cocaine NONE DETECTED NONE DETECTED   Benzodiazepines NONE DETECTED NONE DETECTED   Amphetamines NONE DETECTED NONE DETECTED   Tetrahydrocannabinol NONE DETECTED NONE DETECTED   Barbiturates NONE DETECTED NONE DETECTED    Comment: (NOTE) DRUG SCREEN FOR MEDICAL PURPOSES ONLY.  IF CONFIRMATION IS NEEDED FOR ANY PURPOSE, NOTIFY LAB WITHIN 5 DAYS.  LOWEST DETECTABLE LIMITS FOR URINE DRUG SCREEN Drug Class                     Cutoff (ng/mL) Amphetamine and metabolites    1000 Barbiturate and metabolites     200 Benzodiazepine                 599 Tricyclics and metabolites     300 Opiates and metabolites        300 Cocaine and metabolites        300 THC  50 Performed at [facility], [address], [place], [place] [address]   Urinalysis, Routine w reflex microscopic Urine, Clean Catch     Status: Abnormal   Collection Time: 09/03/21  7:12 AM  Result Value Ref Range   Color, Urine YELLOW YELLOW   APPearance CLEAR CLEAR   Specific Gravity, Urine 1.027 1.005 - 1.030   pH 6.0 5.0 - 8.0   Glucose, UA NEGATIVE NEGATIVE mg/dL   Hgb urine dipstick NEGATIVE NEGATIVE   Bilirubin Urine NEGATIVE NEGATIVE   Ketones, ur NEGATIVE NEGATIVE mg/dL   Protein, ur 100 (A) NEGATIVE mg/dL   Nitrite NEGATIVE NEGATIVE   Leukocytes,Ua NEGATIVE NEGATIVE   RBC / HPF 0-5 0 - 5 RBC/hpf   WBC, UA 0-5 0 - 5 WBC/hpf   Bacteria, UA NONE SEEN NONE SEEN   Squamous Epithelial / LPF 0-5 0 - 5    Comment: Performed at [facility], [address] [place], [place] [address]  CBG monitoring, ED     Status: Abnormal   Collection Time: 09/03/21  8:16 AM  Result Value Ref Range   Glucose-Capillary 202 (H) 70 - 99 mg/dL    Comment: Glucose reference range applies only to samples taken after fasting for at least 8 hours.  CBG monitoring, ED     Status: None   Collection Time: 09/03/21 12:10 PM  Result Value Ref Range   Glucose-Capillary 76 70 - 99 mg/dL    Comment: Glucose reference range applies only to samples taken after fasting for at least 8 hours.  Glucose, capillary     Status: Abnormal   Collection Time: 09/03/21  3:59 PM  Result Value Ref Range   Glucose-Capillary 138 (H) 70 - 99 mg/dL    Comment: Glucose reference range applies only to samples taken after fasting for at least 8 hours.    Studies/Results:   TTE  1. Left ventricular ejection fraction, by estimation, is 65 to 70%. The  left ventricle has normal function. The left ventricle has no regional  wall motion abnormalities.  There is mild left ventricular hypertrophy.  Left ventricular diastolic parameters  are consistent with Grade I diastolic dysfunction (impaired relaxation).   2. Right ventricular systolic function is normal. The right ventricular  size is normal. There is normal pulmonary artery systolic pressure.   3. A small pericardial effusion is present. The pericardial effusion is  circumferential.   4. The mitral valve is normal in structure. No evidence of mitral valve  regurgitation. No evidence of mitral stenosis.   5. The aortic valve is tricuspid. Aortic valve regurgitation is not  visualized. No aortic stenosis is present.   6. The inferior vena cava is normal in size with greater than 50%  respiratory variability, suggesting right atrial pressure of 3 mmHg.     HEAD NECK CTA IMPRESSION: 1. Negative CTA for emergent large vessel occlusion. 2. Negative CT perfusion with no evidence for acute ischemia or other perfusion abnormality. 3. 50% atheromatous stenosis at the origin of the dominant left vertebral artery. 4. Additional moderate atheromatous disease elsewhere about the major arterial vasculature of the head and neck as above. No other hemodynamically significant or correctable stenosis. 5. Scattered ground-glass opacity with interlobular septal thickening within the visualized lungs, favored to reflect pulmonary interstitial congestion/edema.     BRAIN MRI FINDINGS: Brain: There are multiple foci of diffusion restriction with associated T2/FLAIR signal abnormality in the right basal ganglia, with the largest focus in the right lentiform nucleus corresponding to  the hypodensity seen on the CT head dated 1 day prior. There are two punctate foci of diffusion restriction in the left basal ganglia. Findings consistent with evolving early subacute infarcts. There is no associated hemorrhage. There is an additional remote lacunar infarct in the right lentiform nucleus.   There  is no evidence of acute intracranial hemorrhage or extra-axial fluid collection.   There is mild global parenchymal volume loss. Additional foci of FLAIR signal abnormality in the subcortical and periventricular white matter are nonspecific but likely reflects sequela of mild chronic white matter microangiopathy.   There is no solid mass lesion.  There is no midline shift.   Vascular: Normal flow voids.   Skull and upper cervical spine: Normal marrow signal.   Sinuses/Orbits: The imaged paranasal sinuses are clear. The globes and orbits are unremarkable.   Other: None.   IMPRESSION: Multiple small evolving early subacute infarcts in the bilateral basal ganglia, right more than left, with the largest focus in the right lentiform nucleus.       The brain MRI is reviewed in person and shows infarct involving the right basal ganglia and  thalamus. There is also a single left basal ganglia infarct these are small infarcts. There is no microhemorrhage noted. There is remote right basal ganglia infarct. 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            Manda Holstad A. Claudia Keller, M.D.  Diplomate, Tax adviser of Psychiatry and Neurology ( Neurology). 09/03/2021, 7:14 PM

## 2021-09-03 NOTE — TOC Initial Note (Signed)
Transition of Care [facility]) - Initial/Assessment Note    Patient Details  Name: Claudia Keller MRN: [id number] Date of Birth: 1951-02-10  [facility] CM/SW Contact:    Shade Flood, LCSW Phone Number: 09/03/2021, 10:51 AM  Clinical Narrative:                  Pt admitted from home. PT/OT recommending SNF rehab. Pt has [facility] insurance coverage. Spoke with [place], [place], [phone]) this AM to update. Per Estill Bamberg, they are contracted with [facility], and [facility] for SNF. [facility] will provide transport at dc.  Spoke with pt's son to assess and review therapy recommendations. Son agreeable to SNF rehab. He understands [facility] contracted facilities are the only option. Will follow up once bed offers available. MD anticipating dc in 1-2 days.   Expected Discharge Plan: Skilled Nursing Facility Barriers to Discharge: Continued Medical Work up   Patient Goals and CMS Choice Patient states their goals for this hospitalization and ongoing recovery are:: rehab CMS Medicare.gov Compare Post Acute Care list provided to:: Patient Represenative (must comment) Choice offered to / list presented to : Adult Children  Expected Discharge Plan and Services Expected Discharge Plan: [place] In-house Referral: Clinical Social Work   Post Acute Care Choice: [place] Living arrangements for the past 2 months: Apartment                                      Prior Living Arrangements/Services Living arrangements for the past 2 months: Apartment Lives with:: Self Patient language and need for interpreter reviewed:: Yes Do you feel safe going back to the place where you live?: Yes      Need for Family Participation in Patient Care: Yes (Comment) Care giver support system in place?: Yes (comment) Current home services: DME Criminal Activity/Legal Involvement Pertinent to Current Situation/Hospitalization: No - Comment as  needed  Activities of Daily Living      Permission Sought/Granted Permission sought to share information with : Facility Art therapist granted to share information with : Yes, Verbal Permission Granted     Permission granted to share info w AGENCY: snfs        Emotional Assessment       Orientation: : Oriented to Self Alcohol / Substance Use: Not Applicable Psych Involvement: No (comment)  Admission diagnosis:  CVA (cerebral vascular accident) [facility]) [I63.9] Patient Active Problem List   Diagnosis Date Noted   CVA (cerebral vascular accident) ([place]) 09/02/2021   Pneumonia due to COVID-19 virus 12/07/2020   Leukocytosis 12/07/2020   Hyperglycemia due to diabetes mellitus ([place]) 12/07/2020   Hypoalbuminemia 12/07/2020   Elevated AST (SGOT) 12/07/2020   Obesity (BMI 30-39.9) 12/07/2020   COPD (chronic obstructive pulmonary disease) ([place]) 12/07/2020   GERD (gastroesophageal reflux disease) 12/07/2020   Diabetic neuropathy ([place]) 12/07/2020   Depression 12/07/2020   Anxiety 12/07/2020   Acute respiratory disease due to COVID-19 virus 12/07/2020   Acute respiratory failure with hypoxia ([place]) 12/01/2020   Segmental colitis without complication (HCC)    Class 2 obesity due to excess calories with body mass index (BMI) of 37.0 to 37.9 in adult    Ischemic colitis ([place]) 03/19/2020   Noncompliance with medication regimen 03/18/2020   Acute renal failure superimposed on stage 3a chronic kidney disease ([place]) 03/18/2020   DKA (diabetic ketoacidoses) 03/17/2020   Bipolar  II disorder ([place]) 04/17/2018   PTSD (post-traumatic stress disorder) 04/17/2018   Personal history of noncompliance with medical treatment, presenting hazards to health 08/17/2016   Emesis 05/20/2016   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 ([place]) 09/08/2015   Essential hypertension, benign 09/08/2015   Primary hypothyroidism 09/08/2015   Diverticulosis of colon without hemorrhage    Nausea  vomiting and diarrhea 10/27/2011   Chest pain 06/24/2011   Hypercholesterolemia 06/24/2011   Tobacco abuse 06/24/2011   IRRITABLE BOWEL SYNDROME 11/12/2009   Diarrhea 11/12/2009   OTHER SYMPTOMS INVOLVING DIGESTIVE SYSTEM OTHER 11/12/2009   History of colonic polyps 11/12/2009   Closed fracture of metatarsal bone 11/30/2007   HIGH BLOOD PRESSURE 11/30/2007   PCP:  Inc, [place]:   [place] [address] [address] Phone: [phone] Fax: [phone]     Social Determinants of Health (SDOH) Interventions    Readmission Risk Interventions Readmission Risk Prevention Plan 12/08/2020  Transportation Screening Complete  HRI or Home Care Consult Complete  Social Work Consult for [place] Planning/Counseling Complete  Palliative Care Screening Not Applicable  Medication Review Press photographer) Complete  Some recent data might be hidden

## 2021-09-03 NOTE — ED Notes (Signed)
Pt soiled upon assessment. Pericare provided, new purwick, chuck, and brief placed.

## 2021-09-03 NOTE — ED Notes (Signed)
Pt soiled with urine. Pericare provided, brief, and chuck pad changed.

## 2021-09-03 NOTE — Plan of Care (Signed)
  Problem: Acute Rehab PT Goals(only PT should resolve) Goal: Pt Will Go Supine/Side To Sit Outcome: Progressing Flowsheets (Taken 09/03/2021 1219) Pt will go Supine/Side to Sit:  with minimal assist  with moderate assist Goal: Patient Will Transfer Sit To/From Stand Outcome: Progressing Flowsheets (Taken 09/03/2021 1219) Patient will transfer sit to/from stand:  with minimal assist  with moderate assist Goal: Pt Will Transfer Bed To Chair/Chair To Bed Outcome: Progressing Flowsheets (Taken 09/03/2021 1219) Pt will Transfer Bed to Chair/Chair to Bed:  with min assist  with mod assist Goal: Pt Will Ambulate Outcome: Progressing Flowsheets (Taken 09/03/2021 1219) Pt will Ambulate:  25 feet  with moderate assist  with rolling walker   12:19 PM, 09/03/21 Lonell Grandchild, MPT Physical Therapist with [facility] [phone] office [phone] mobile phone

## 2021-09-03 NOTE — ED Notes (Signed)
Pt O2 90-92%. Placed on 2L [place].

## 2021-09-03 NOTE — NC FL2 (Signed)
[place] LEVEL OF CARE SCREENING TOOL     IDENTIFICATION  Patient Name: Claudia Keller Birthdate: 1951/08/27 Sex: female Admission Date (Current Location): 09/02/2021  [facility] and [place] Number:  [employer] and Address:  [place] [address], [place]      Provider Number: [id number]  Attending Physician Name and Address:  Rolla Plate, DO  Relative Name and Phone Number:       Current Level of Care: Hospital Recommended Level of Care: [place] Prior Approval Number:    Date Approved/Denied:   PASRR Number: [id number]  Discharge Plan: SNF    Current Diagnoses: Patient Active Problem List   Diagnosis Date Noted   CVA (cerebral vascular accident) ([place]) 09/02/2021   Pneumonia due to COVID-19 virus 12/07/2020   Leukocytosis 12/07/2020   Hyperglycemia due to diabetes mellitus ([place]) 12/07/2020   Hypoalbuminemia 12/07/2020   Elevated AST (SGOT) 12/07/2020   Obesity (BMI 30-39.9) 12/07/2020   COPD (chronic obstructive pulmonary disease) ([place]) 12/07/2020   GERD (gastroesophageal reflux disease) 12/07/2020   Diabetic neuropathy ([place]) 12/07/2020   Depression 12/07/2020   Anxiety 12/07/2020   Acute respiratory disease due to COVID-19 virus 12/07/2020   Acute respiratory failure with hypoxia ([place]) 12/01/2020   Segmental colitis without complication (HCC)    Class 2 obesity due to excess calories with body mass index (BMI) of 37.0 to 37.9 in adult    Ischemic colitis ([place]) 03/19/2020   Noncompliance with medication regimen 03/18/2020   Acute renal failure superimposed on stage 3a chronic kidney disease ([place]) 03/18/2020   DKA (diabetic ketoacidoses) 03/17/2020   Bipolar II disorder ([place]) 04/17/2018   PTSD (post-traumatic stress disorder) 04/17/2018   Personal history of noncompliance with medical treatment, presenting hazards to health 08/17/2016   Emesis 05/20/2016   Type 2 DM with CKD stage  2 and hypertension ([place]) 09/08/2015   Essential hypertension, benign 09/08/2015   Primary hypothyroidism 09/08/2015   Diverticulosis of colon without hemorrhage    Nausea vomiting and diarrhea 10/27/2011   Chest pain 06/24/2011   Hypercholesterolemia 06/24/2011   Tobacco abuse 06/24/2011   IRRITABLE BOWEL SYNDROME 11/12/2009   Diarrhea 11/12/2009   OTHER SYMPTOMS INVOLVING DIGESTIVE SYSTEM OTHER 11/12/2009   History of colonic polyps 11/12/2009   Closed fracture of metatarsal bone 11/30/2007   HIGH BLOOD PRESSURE 11/30/2007    Orientation RESPIRATION BLADDER Height & Weight     Self  O2 (see dc summary) Continent Weight: 153 lb 3.2 oz (69.5 kg) Height:  5\' 4"  (162.6 cm)  BEHAVIORAL SYMPTOMS/MOOD NEUROLOGICAL BOWEL NUTRITION STATUS      Continent Diet (see dc summary)  AMBULATORY STATUS COMMUNICATION OF NEEDS Skin   Extensive Assist Verbally Normal                       Personal Care Assistance Level of Assistance  Bathing, Feeding, Dressing Bathing Assistance: Limited assistance Feeding assistance: Independent Dressing Assistance: Limited assistance     Functional Limitations Info  Sight, Hearing, Speech Sight Info: Adequate Hearing Info: Adequate Speech Info: Adequate    SPECIAL CARE FACTORS FREQUENCY  PT (By licensed PT), OT (By licensed OT)     PT Frequency: 5x week OT Frequency: 3x week            Contractures Contractures Info: Not present    Additional Factors Info  Code Status, Allergies Code Status Info: Full Allergies Info: NKA  Current Medications (09/03/2021):  This is the current hospital active medication list Current Facility-Administered Medications  Medication Dose Route Frequency Provider Last Rate Last Admin    stroke: mapping our early stages of recovery book   Does not apply Once Zierle-Ghosh, [place] B, DO       acetaminophen (TYLENOL) tablet 650 mg  650 mg Oral Q4H PRN Zierle-Ghosh, Asia B, DO       Or    acetaminophen (TYLENOL) 160 MG/5ML solution 650 mg  650 mg Per Tube Q4H PRN Zierle-Ghosh, Asia B, DO       Or   acetaminophen (TYLENOL) suppository 650 mg  650 mg Rectal Q4H PRN Zierle-Ghosh, Asia B, DO       aspirin tablet 325 mg  325 mg Oral Once Zierle-Ghosh, Asia B, DO       atorvastatin (LIPITOR) tablet 20 mg  20 mg Oral QHS Zierle-Ghosh, Asia B, DO       colestipol (COLESTID) tablet 1 g  1 g Oral Daily Zierle-Ghosh, Asia B, DO       divalproex (DEPAKOTE ER) 24 hr tablet 1,000 mg  1,000 mg Oral QPM Zierle-Ghosh, Asia B, DO       divalproex (DEPAKOTE) DR tablet 250 mg  250 mg Oral Daily Zierle-Ghosh, Asia B, DO       gabapentin (NEURONTIN) capsule 100 mg  100 mg Oral TID Zierle-Ghosh, Asia B, DO       heparin injection 5,000 Units  5,000 Units Subcutaneous Q8H Zierle-Ghosh, Asia B, DO   5,000 Units at 09/03/21 6256   insulin aspart (novoLOG) injection 0-15 Units  0-15 Units Subcutaneous TID WC Zierle-Ghosh, Asia B, DO   5 Units at 09/03/21 0824   insulin aspart (novoLOG) injection 0-5 Units  0-5 Units Subcutaneous QHS Zierle-Ghosh, Asia B, DO       insulin detemir (LEVEMIR) injection 45 Units  45 Units Subcutaneous Daily Zierle-Ghosh, Asia B, DO       levothyroxine (SYNTHROID) tablet 200 mcg  200 mcg Oral QAC breakfast Zierle-Ghosh, Asia B, DO       mirtazapine (REMERON) tablet 7.5 mg  7.5 mg Oral QHS Zierle-Ghosh, Asia B, DO       PARoxetine (PAXIL) tablet 40 mg  40 mg Oral Daily Zierle-Ghosh, Asia B, DO       QUEtiapine (SEROQUEL) tablet 100 mg  100 mg Oral QHS Zierle-Ghosh, Asia B, DO       senna-docusate (Senokot-S) tablet 1 tablet  1 tablet Oral QHS PRN Zierle-Ghosh, Asia B, DO       Current Outpatient Medications  Medication Sig Dispense Refill   albuterol (VENTOLIN HFA) 108 (90 Base) MCG/ACT inhaler Inhale 2 puffs into the lungs every 4 (four) hours as needed for wheezing or shortness of breath. 8 g 1   atorvastatin (LIPITOR) 20 MG tablet Take 1 tablet (20 mg total) by mouth at  bedtime.     Calcium Carb-Cholecalciferol (OYSTER SHELL CALCIUM W/D) 500-200 MG-UNIT TABS Take 1 tablet by mouth 2 (two) times daily.     cetirizine (ZYRTEC) 10 MG tablet Take 10 mg by mouth daily.     colestipol (COLESTID) 1 g tablet TAKE 2 TABLETS DAILY FOR DIARRHEA. DO NOT TAKE WITHIN 2 HOURS OF OTHER ORAL MEDICATIONS. HOLD FOR CONSTIPATION. 60 tablet 11   Continuous Blood Gluc Sensor (FREESTYLE LIBRE 2 SENSOR) MISC 1 Device by Does not apply route every 14 (fourteen) days. 6 each 3   diltiazem (CARDIZEM CD) 120 MG 24 hr capsule Take  1 capsule (120 mg total) by mouth daily. 30 capsule 11   divalproex (DEPAKOTE ER) 500 MG 24 hr tablet Take 2 tablets (1,000 mg total) by mouth every evening. 180 tablet 0   divalproex (DEPAKOTE) 250 MG DR tablet Take 1 tablet (250 mg total) by mouth daily. 90 tablet 0   furosemide (LASIX) 20 MG tablet Take 20 mg by mouth daily.     gabapentin (NEURONTIN) 100 MG capsule Take 100 mg by mouth 3 (three) times daily.     guaiFENesin-dextromethorphan (ROBITUSSIN DM) 100-10 MG/5ML syrup Take 10 mLs by mouth every 4 (four) hours as needed for cough. 118 mL 0   Insulin NPH, Human,, Isophane, (NOVOLIN N FLEXPEN) 100 UNIT/ML Kiwkpen Inject 60 Units into the skin every morning. 60 mL 3   levothyroxine (SYNTHROID) 100 MCG tablet Take 2 tablets (200 mcg total) by mouth daily before breakfast.     lisinopril (PRINIVIL,ZESTRIL) 20 MG tablet Take 20 mg by mouth daily.     mirtazapine (REMERON) 7.5 MG tablet Take 1 tablet (7.5 mg total) by mouth at bedtime. 90 tablet 0   nicotine polacrilex (NICORETTE) 4 MG gum Take 1 each (4 mg total) by mouth as needed for smoking cessation. 100 tablet 0   ondansetron (ZOFRAN) 4 MG tablet Take 1 tablet (4 mg total) by mouth every 8 (eight) hours as needed for nausea or vomiting. 30 tablet 2   pantoprazole (PROTONIX) 40 MG tablet TAKE 1 TABLET BY MOUTH ONCE DAILY 30 MINTUES BEFORE BREAKFAST. (Patient taking differently: Take 40 mg by mouth daily  before breakfast.) 30 tablet 11   PARoxetine (PAXIL) 40 MG tablet Take 1 tablet (40 mg total) by mouth daily. 90 tablet 0   QUEtiapine (SEROQUEL) 100 MG tablet Take 1 tablet (100 mg total) by mouth at bedtime. 90 tablet 0   glucose blood (QUINTET BLOOD GLUCOSE TEST) test strip Check blood sugar 2 times daily E11.65 100 each 2     Discharge Medications: Please see discharge summary for a list of discharge medications.  Relevant Imaging Results:  Relevant Lab Results:   Additional Information SSN: [id number] [place]

## 2021-09-03 NOTE — Evaluation (Signed)
Physical Therapy Evaluation Patient Details Name: Claudia Keller MRN: [id number] DOB: June 24, 1951 Today's Date: 09/03/2021  History of Present Illness  Claudia Keller  is a 70 y.o. female, with history of COPD, hypertension, hypothyroidism, mixed hyperlipidemia, tobacco use, diabetes mellitus type 2, PTSD, and more presents to ED with chief complaint of fall.  Apparently EMS was called out to her house for the second time today around [phone] when patient fell and hit her life alert button.  Patient is quite confused at the time of my exam.  She keeps reporting that she came in because of her sugar.  She is not sure if she is hyper or hypoglycemic per her report.  We asked her questions about fall or unilateral weakness, she just says "my sugar my sugar."  Per chart review patient had sudden onset of left-sided weakness and dysarthria-last known well 7:10 PM.  She is not a candidate for thrombolytic therapy as she had a subacute stroke on CT.  Telemetry neuro recommends permissive hypertension with treatment of blood pressure of 220/120 or greater.   Clinical Impression  Patient presents with left side weakness with poor left hand grip for using RW, at high risk for falls and unable to advance LLE due to weakness requiring Max tactile assistance to shuffle left foot when taking side steps.  Patient appears to have left sided neglect requiring frequent verbal/tactile cueing when attempting to complete tasks with left side.  Patient put back to bed after therapy.  Patient will benefit from continued physical therapy in hospital and recommended venue below to increase strength, balance, endurance for safe ADLs and gait.        Recommendations for follow up therapy are one component of a multi-disciplinary discharge planning process, led by the attending physician.  Recommendations may be updated based on patient status, additional functional criteria and insurance authorization.  Follow Up Recommendations  Skilled nursing-short term rehab (<3 hours/day)    Assistance Recommended at Discharge Frequent or constant Supervision/Assistance  Functional Status Assessment Patient has had a recent decline in their functional status and demonstrates the ability to make significant improvements in function in a reasonable and predictable amount of time.  Equipment Recommendations  None recommended by PT    Recommendations for Other Services       Precautions / Restrictions Precautions Precautions: Fall Restrictions Weight Bearing Restrictions: No      Mobility  Bed Mobility Overal bed mobility: Needs Assistance Bed Mobility: Supine to Sit;Sit to Supine     Supine to sit: Mod assist Sit to supine: Mod assist;Max assist   General bed mobility comments: Pt demonstrates labored movement with difficulty following commands for bed mobility.    Transfers Overall transfer level: Needs assistance Equipment used: Rolling walker (2 wheels) Transfers: Sit to/from [facility] Sit to Stand: Mod assist;Max assist Stand pivot transfers: Mod assist;Max assist         General transfer comment: Able to grip RW; unsteady in standing; unable to step forward or backward with L LE without assist.    Ambulation/Gait Ambulation/Gait assistance: Max assist Gait Distance (Feet): 3 Feet Assistive device: Rolling walker (2 wheels) Gait Pattern/deviations: Decreased step length - right;Decreased step length - left;Decreased stance time - left;Shuffle Gait velocity: slow   General Gait Details: limited to a few side steps with inability to lift LLE off floor due to weakness, required Max tactile assist to shuffle left foot  Stairs  Wheelchair Mobility    Modified Rankin (Stroke Patients Only)       Balance Overall balance assessment: Needs assistance Sitting-balance support: No upper extremity supported;Feet supported Sitting balance-Leahy Scale: Poor Sitting  balance - Comments: poor with posterior lean Postural control: Posterior lean Standing balance support: Bilateral upper extremity supported;During functional activity Standing balance-Leahy Scale: Poor Standing balance comment: using RW                             Pertinent Vitals/Pain Pain Assessment: Faces Faces Pain Scale: No hurt    Home Living Family/patient expects to be discharged to:: Private residence Living Arrangements: Alone Available Help at Discharge: Family;Available PRN/intermittently Type of Home: Apartment Home Access: Elevator       Home Layout: One level Home Equipment: Cane - single point;Rollator (4 wheels);BSC;Shower seat;Grab bars - tub/shower;Rolling Walker (2 wheels) Additional Comments: History taken from prior admission earlier this year due to pt defiicts in cognition this date.    Prior Function Prior Level of Function : Needs assist             Mobility Comments: Pt reported using a RW at baseline. ADLs Comments: Pt reported she did not need help with toileting but was asisisted with bathing and dressing. Assisted for IADL's.     Hand Dominance   Dominant Hand: Right    Extremity/Trunk Assessment   Upper Extremity Assessment Upper Extremity Assessment: Defer to OT evaluation LUE Deficits / Details: 2+/5 shoulder flexion; 2-/5 elbow flexion; 3+/5 grip; Pt demonstrates flaccidity but is able to move L UE partially and grip RW. LUE:  (Limited due to cognitive deficits.) LUE Coordination: decreased fine motor;decreased gross motor    Lower Extremity Assessment Lower Extremity Assessment: Generalized weakness;LLE deficits/detail LLE Deficits / Details: grossly -3/5 LLE Sensation: decreased proprioception LLE Coordination: decreased fine motor;decreased gross motor    Cervical / Trunk Assessment Cervical / Trunk Assessment: Normal  Communication   Communication: Expressive difficulties;Receptive difficulties  Cognition  Arousal/Alertness: Lethargic Behavior During Therapy: Impulsive Overall Cognitive Status: No family/caregiver present to determine baseline cognitive functioning (Per document review pt demonstrates altered mental status.)                                 General Comments: Pt demonstrates difficulty following commands. Impulsive this date attempting to return to supine position in bed.        General Comments      Exercises     Assessment/Plan    PT Assessment Patient needs continued PT services  PT Problem List Decreased strength;Decreased activity tolerance;Decreased mobility;Decreased balance;Decreased coordination;Decreased safety awareness       PT Treatment Interventions DME instruction;Gait training;Stair training;Functional mobility training;Therapeutic activities;Therapeutic exercise;Balance training;Neuromuscular re-education;Patient/family education    PT Goals (Current goals can be found in the Care Plan section)  Acute Rehab PT Goals Patient Stated Goal: return home PT Goal Formulation: With patient Time For Goal Achievement: 09/17/21 Potential to Achieve Goals: Good    Frequency Min 3X/week   Barriers to discharge        Co-evaluation PT/OT/SLP Co-Evaluation/Treatment: Yes Reason for Co-Treatment: Complexity of the patient's impairments (multi-system involvement);To address functional/ADL transfers PT goals addressed during session: Mobility/safety with mobility;Balance;Proper use of DME OT goals addressed during session: ADL's and self-care;Strengthening/ROM       AM-PAC PT "6 Clicks" Mobility  Outcome Measure Help needed turning from your back  to your side while in a flat bed without using bedrails?: A Lot Help needed moving from lying on your back to sitting on the side of a flat bed without using bedrails?: A Lot Help needed moving to and from a bed to a chair (including a wheelchair)?: A Lot Help needed standing up from a chair using  your arms (e.g., wheelchair or bedside chair)?: A Lot Help needed to walk in hospital room?: A Lot Help needed climbing 3-5 steps with a railing? : Total 6 Click Score: 11    End of Session   Activity Tolerance: Patient tolerated treatment well;Patient limited by fatigue;Patient limited by lethargy Patient left: in bed;with call bell/phone within reach Nurse Communication: Mobility status PT Visit Diagnosis: Unsteadiness on feet (R26.81);Other abnormalities of gait and mobility (R26.89);Muscle weakness (generalized) (M62.81)    Time: 8921-1941 PT Time Calculation (min) (ACUTE ONLY): 20 min   Charges:   PT Evaluation $PT Eval Moderate Complexity: 1 Mod PT Treatments $Therapeutic Activity: 8-22 mins        12:14 PM, 09/03/21 Lonell Grandchild, MPT Physical Therapist with [facility] [phone] office [phone] mobile phone

## 2021-09-03 NOTE — Progress Notes (Signed)
*  PRELIMINARY RESULTS* Echocardiogram 2D Echocardiogram has been performed.  Elpidio Anis 09/03/2021, 10:30 AM

## 2021-09-03 NOTE — ED Notes (Signed)
Physical Therapy at bedside.

## 2021-09-03 NOTE — ED Notes (Signed)
Echo at bedside

## 2021-09-03 NOTE — H&P (Signed)
TRH H&P    Patient Demographics:    Claudia Keller, is a 70 y.o. female  MRN: [id number]  DOB - 11-15-1950  Admit Date - 09/02/2021  Referring MD/NP/PA: Roderic Palau  Outpatient Primary MD for the patient is [facility] [place]  Patient coming from: Home  Chief complaint- Fall   HPI:    Claudia Keller  is a 70 y.o. female, with history of COPD, hypertension, hypothyroidism, mixed hyperlipidemia, tobacco use, diabetes mellitus type 2, PTSD, and more presents to ED with chief complaint of fall.  Apparently EMS was called out to her house for the second time today around [phone] when patient fell and hit her life alert button.  Patient is quite confused at the time of my exam.  She keeps reporting that she came in because of her sugar.  She is not sure if she is hyper or hypoglycemic per her report.  We asked her questions about fall or unilateral weakness, she just says "my sugar my sugar." Per chart review patient had sudden onset of left-sided weakness and dysarthria-last known well 7:10 PM.  She is not a candidate for thrombolytic therapy as she had a subacute stroke on CT.  Telemetry neuro recommends permissive hypertension with treatment of blood pressure of 220/120 or greater.  In the ED Temp 98.5, heart rate 74-84, respiratory rate 11-, blood pressure 99/52, satting at 95% Leukocytosis White blood cell count 9.4, hemoglobin 13.8 Chemistry panel is unremarkable Respiratory panel negative Alcohol level is undetectable EKG shows a heart rate of 84, sinus rhythm, QTC 456 CTA of her head and neck showed negative CTA for emergent large vessel occlusion.  Negative CT perfusion with no evidence for acute ischemic or other perfusion abnormality.  50% stenosis dominant left vertebral artery.  Possible pulmonary edema  Chest x-ray shows mild bibasilar and mid left lung atelectasis and/or early  infiltrate CT head shows focal hypodensity in the right lentiform nucleus suspicious for subacute infarct    Review of systems:    Review of systems cannot be obtained secondary to patient's altered mental status    Past History of the following :    Past Medical History:  Diagnosis Date   Arthritis    COPD (chronic obstructive pulmonary disease) ([place])    Diverticulosis    Essential hypertension, benign    Hx of colonic polyps    Hypothyroidism    IBS (irritable bowel syndrome)    Irritable bowel syndrome    Ketoacidosis    Mixed hyperlipidemia    PTSD (post-traumatic stress disorder)    S/P colonoscopy Jan 2011   RMR: pancolonic diverticula, polypoid rectal mucosa with  prominent lymphoid aggregates, repeat in Jan 2016    Shortness of breath dyspnea    Stress incontinence, female    Thyroid disease    Tobacco use    Type 2 diabetes mellitus ([place])       Past Surgical History:  Procedure Laterality Date   Bilateral tubal ligation     BREAST BIOPSY Left 10/16/2014   negative  CHOLECYSTECTOMY  09/11/2012   Procedure: LAPAROSCOPIC CHOLECYSTECTOMY;  Surgeon: Jamesetta So, MD;  Location: [facility];  Service: General;  Laterality: N/A;   COLONOSCOPY  11/17/2009   Dr. Gala Romney: normal rectum, pancolonic diverticula, polyps benign, no adenomas.    COLONOSCOPY N/A 02/12/2015   Dr. Gala Romney: colonic diverticulosis, multiple colonic polyps (tubular adenomas), negative segmental biopsies. Surveillance in 2021   ESOPHAGOGASTRODUODENOSCOPY  11/23/2011   Dr. Gala Romney: Erosive reflux esophagitis; small hiatal hernia; esophagus dilated empirically   MALONEY DILATION  11/23/2011   Procedure: Venia Minks DILATION;  Surgeon: Daneil Dolin, MD;  Location: [facility];  Service: Endoscopy;  Laterality: N/A;   SKIN LESION EXCISION     on buttocks   TUBAL LIGATION        Social History:      Social History   Tobacco Use   Smoking status: Every Day    Packs/day: 2.00    Years: 43.00    Pack  years: 86.00    Types: Cigarettes   Smokeless tobacco: Never  Substance Use Topics   Alcohol use: No    Alcohol/week: 0.0 standard drinks       Family History :     Family History  Problem Relation Age of Onset   Schizophrenia Brother    Stroke Brother    Cancer Mother        unsure what type   Heart disease Father    Heart attack Father    Coronary artery disease Other    Diabetes type II Other    Cancer Son        bladder   Colon cancer Neg Hx       Home Medications:   Prior to Admission medications   Medication Sig Start Date End Date Taking? Authorizing Provider  albuterol (VENTOLIN HFA) 108 (90 Base) MCG/ACT inhaler Inhale 2 puffs into the lungs every 4 (four) hours as needed for wheezing or shortness of breath. 12/03/20   Manuella Ghazi, Pratik D, DO  atorvastatin (LIPITOR) 20 MG tablet Take 1 tablet (20 mg total) by mouth at bedtime. 12/12/20   Kathie Dike, MD  Calcium Carb-Cholecalciferol (OYSTER SHELL CALCIUM W/D) 500-200 MG-UNIT TABS Take 1 tablet by mouth 2 (two) times daily. 11/10/20   [provider]  cetirizine (ZYRTEC) 10 MG tablet Take 10 mg by mouth daily. 11/10/20   [provider]  colestipol (COLESTID) 1 g tablet TAKE 2 TABLETS DAILY FOR DIARRHEA. DO NOT TAKE WITHIN 2 HOURS OF OTHER ORAL MEDICATIONS. HOLD FOR CONSTIPATION. 05/23/20   Carlis Stable, NP  Continuous Blood Gluc Sensor (FREESTYLE LIBRE 2 SENSOR) MISC 1 Device by Does not apply route every 14 (fourteen) days. 08/27/21   Renato Shin, MD  diltiazem (CARDIZEM CD) 120 MG 24 hr capsule Take 1 capsule (120 mg total) by mouth daily. 12/12/20 12/12/21  Kathie Dike, MD  divalproex (DEPAKOTE ER) 500 MG 24 hr tablet Take 2 tablets (1,000 mg total) by mouth every evening. 02/20/21   Norman Clay, MD  divalproex (DEPAKOTE) 250 MG DR tablet Take 1 tablet (250 mg total) by mouth daily. 02/20/21   Norman Clay, MD  furosemide (LASIX) 20 MG tablet Take 20 mg by mouth daily.    [provider]   gabapentin (NEURONTIN) 100 MG capsule Take 100 mg by mouth 3 (three) times daily.    [provider]  glucose blood (QUINTET BLOOD GLUCOSE TEST) test strip Check blood sugar 2 times daily E11.65 08/12/21   Renato Shin, MD  guaiFENesin-dextromethorphan (ROBITUSSIN DM) 100-10 MG/5ML syrup Take 10 mLs by mouth every 4 (four) hours as needed for cough. 12/03/20   Manuella Ghazi, Pratik D, DO  Insulin NPH, Human,, Isophane, (NOVOLIN N FLEXPEN) 100 UNIT/ML Kiwkpen Inject 60 Units into the skin every morning. 08/27/21   Renato Shin, MD  levothyroxine (SYNTHROID) 100 MCG tablet Take 2 tablets (200 mcg total) by mouth daily before breakfast. 12/12/20   Kathie Dike, MD  lisinopril (PRINIVIL,ZESTRIL) 20 MG tablet Take 20 mg by mouth daily.    [provider]  mirtazapine (REMERON) 7.5 MG tablet Take 1 tablet (7.5 mg total) by mouth at bedtime. 02/20/21   Norman Clay, MD  nicotine polacrilex (NICORETTE) 4 MG gum Take 1 each (4 mg total) by mouth as needed for smoking cessation. 12/12/20   Kathie Dike, MD  ondansetron (ZOFRAN) 4 MG tablet Take 1 tablet (4 mg total) by mouth every 8 (eight) hours as needed for nausea or vomiting. 10/17/18   Carlis Stable, NP  pantoprazole (PROTONIX) 40 MG tablet TAKE 1 TABLET BY MOUTH ONCE DAILY 30 MINTUES BEFORE BREAKFAST. Patient taking differently: Take 40 mg by mouth daily before breakfast. 05/23/20   Carlis Stable, NP  PARoxetine (PAXIL) 40 MG tablet Take 1 tablet (40 mg total) by mouth daily. 02/20/21   Norman Clay, MD  QUEtiapine (SEROQUEL) 100 MG tablet Take 1 tablet (100 mg total) by mouth at bedtime. 02/20/21   Norman Clay, MD     Allergies:    No Known Allergies   Physical Exam:   Vitals  Blood pressure 118/86, pulse 76, temperature 98.5 F (36.9 C), temperature source Oral, resp. rate (!) 21, height 5\' 4"  (1.626 m), weight 69.5 kg, SpO2 95 %.   1.  General: Patient lying supine in bed, head of bed elevated, no acute distress   2.  Psychiatric: Somnolent oriented only to self, not following commands   3. Neurologic: Speech and language are normal, slight left facial droop, will not follow commands to move left leg or left hand.  When passively lifted and released left leg falls immediately, will not cooperate with grip strength in either hand.  Does move right hand.  Does not resist movement at all with left upper extremity.  Left arm falls to bed when released.  He is not answering appropriately regarding sensation questions, grimace to painful stimuli in all 4 extremities  4. HEENMT:  Head is atraumatic, normocephalic, pupils reactive to light, neck is supple, trachea is midline, mucous membranes are moist   5. Respiratory : Lungs are clear to auscultation bilaterally without wheezing, rhonchi, rales, no cyanosis, no increase in work of breathing or accessory muscle use   6. Cardiovascular : Heart rate normal, rhythm is regular, no murmurs, rubs or gallops, no peripheral edema, peripheral pulses palpated   7. Gastrointestinal:  Abdomen is soft, nondistended, nontender to palpation bowel sounds active, no masses or organomegaly palpated   8. Skin:  Skin is warm, dry and intact without rashes, acute lesions, or ulcers on limited exam   9.Musculoskeletal:  No acute deformities or trauma, no asymmetry in tone, no peripheral edema, peripheral pulses palpated, no tenderness to palpation in the extremities     Data Review:    CBC Recent Labs  Lab 09/02/21 2018  WBC 9.4  HGB 13.8  HCT 39.9  PLT 201  MCV 91.5  MCH 31.7  MCHC 34.6  RDW 12.9  LYMPHSABS 2.6  MONOABS 0.8  EOSABS 0.1  BASOSABS 0.1   ------------------------------------------------------------------------------------------------------------------  Results for orders placed or performed during the hospital encounter of 09/02/21 (from the past 48 hour(s))  Ethanol     Status: None   Collection Time: 09/02/21  8:18 PM  Result Value Ref Range    Alcohol, Ethyl (B) <10 <10 mg/dL    Comment: Performed at [facility], [address] [place], [place] [address]  Protime-INR     Status: None   Collection Time: 09/02/21  8:18 PM  Result Value Ref Range   Prothrombin Time 12.1 11.4 - 15.2 seconds   INR 0.9 0.8 - 1.2    Comment: (NOTE) INR goal varies based on device and disease states. Performed at [facility], [address], [place], [place] [address]   APTT     Status: None   Collection Time: 09/02/21  8:18 PM  Result Value Ref Range   aPTT 29 24 - 36 seconds    Comment: Performed at [facility], [address], [place], [place] [address]  CBC     Status: None   Collection Time: 09/02/21  8:18 PM  Result Value Ref Range   WBC 9.4 4.0 - 10.5 K/uL   RBC 4.36 3.87 - 5.11 MIL/uL   Hemoglobin 13.8 12.0 - 15.0 g/dL   HCT 39.9 36.0 - 46.0 %   MCV 91.5 80.0 - 100.0 fL   MCH 31.7 26.0 - 34.0 pg   MCHC 34.6 30.0 - 36.0 g/dL   RDW 12.9 11.5 - 15.5 %   Platelets 201 150 - 400 K/uL   nRBC 0.0 0.0 - 0.2 %    Comment: Performed at [facility], [address], [place], [place] [address]  Differential     Status: None   Collection Time: 09/02/21  8:18 PM  Result Value Ref Range   Neutrophils Relative % 61 %   Neutro Abs 5.9 1.7 - 7.7 K/uL   Lymphocytes Relative 28 %   Lymphs Abs 2.6 0.7 - 4.0 K/uL   Monocytes Relative 8 %   Monocytes Absolute 0.8 0.1 - 1.0 K/uL   Eosinophils Relative 1 %   Eosinophils Absolute 0.1 0.0 - 0.5 K/uL   Basophils Relative 1 %   Basophils Absolute 0.1 0.0 - 0.1 K/uL   Immature Granulocytes 1 %   Abs Immature Granulocytes 0.06 0.00 - 0.07 K/uL    Comment: Performed at [facility], [address] [place], [place] [address]  Comprehensive metabolic panel     Status: Abnormal   Collection Time: 09/02/21  8:18 PM  Result Value Ref Range   Sodium 132 (L) 135 - 145 mmol/L   Potassium 4.3 3.5 - 5.1 mmol/L   Chloride 98 98 - 111 mmol/L   CO2 23 22 - 32 mmol/L   Glucose, Bld 164 (H) 70 - 99 mg/dL     Comment: Glucose reference range applies only to samples taken after fasting for at least 8 hours.   BUN 16 8 - 23 mg/dL   Creatinine, Ser 1.20 (H) 0.44 - 1.00 mg/dL   Calcium 9.7 8.9 - 10.3 mg/dL   Total Protein 6.1 (L) 6.5 - 8.1 g/dL   Albumin 3.5 3.5 - 5.0 g/dL   AST 18 15 - 41 U/L   ALT 17 0 - 44 U/L   Alkaline Phosphatase 47 38 - 126 U/L   Total Bilirubin 0.5 0.3 - 1.2 mg/dL   GFR, Estimated 49 (L) >60 mL/min    Comment: (NOTE) Calculated using the CKD-EPI Creatinine Equation (2021)  Anion gap 11 5 - 15    Comment: Performed at [facility], [address] [place], [place] [address]  Brain natriuretic peptide     Status: Abnormal   Collection Time: 09/02/21  8:18 PM  Result Value Ref Range   B Natriuretic Peptide 203.0 (H) 0.0 - 100.0 pg/mL    Comment: Performed at [facility], [address], [place], [place] [address]  CBG monitoring, ED     Status: Abnormal   Collection Time: 09/02/21  8:23 PM  Result Value Ref Range   Glucose-Capillary 171 (H) 70 - 99 mg/dL    Comment: Glucose reference range applies only to samples taken after fasting for at least 8 hours.  Resp Panel by RT-PCR (Flu A&B, Covid) Nasopharyngeal Swab     Status: None   Collection Time: 09/02/21  8:30 PM   Specimen: Nasopharyngeal Swab; Nasopharyngeal(NP) swabs in vial transport medium  Result Value Ref Range   SARS Coronavirus 2 by RT PCR NEGATIVE NEGATIVE    Comment: (NOTE) SARS-CoV-2 target nucleic acids are NOT DETECTED.  The SARS-CoV-2 RNA is generally detectable in upper respiratory specimens during the acute phase of infection. The lowest concentration of SARS-CoV-2 viral copies this assay can detect is 138 copies/mL. A negative result does not preclude SARS-Cov-2 infection and should not be used as the sole basis for treatment or other patient management decisions. A negative result may occur with  improper specimen collection/handling, submission of specimen other than nasopharyngeal swab,  presence of viral mutation(s) within the areas targeted by this assay, and inadequate number of viral copies(<138 copies/mL). A negative result must be combined with clinical observations, patient history, and epidemiological information. The expected result is Negative.  Fact Sheet for Patients:  EntrepreneurPulse.com.au  Fact Sheet for Healthcare Providers:  IncredibleEmployment.be  This test is no t yet approved or cleared by the [place] FDA and  has been authorized for detection and/or diagnosis of SARS-CoV-2 by FDA under an Emergency Use Authorization (EUA). This EUA will remain  in effect (meaning this test can be used) for the duration of the COVID-19 declaration under Section 564(b)(1) of the Act, 21 U.S.C.section 360bbb-3(b)(1), unless the authorization is terminated  or revoked sooner.       Influenza A by PCR NEGATIVE NEGATIVE   Influenza B by PCR NEGATIVE NEGATIVE    Comment: (NOTE) The Xpert Xpress SARS-CoV-2/FLU/RSV plus assay is intended as an aid in the diagnosis of influenza from Nasopharyngeal swab specimens and should not be used as a sole basis for treatment. Nasal washings and aspirates are unacceptable for Xpert Xpress SARS-CoV-2/FLU/RSV testing.  Fact Sheet for Patients: EntrepreneurPulse.com.au  Fact Sheet for Healthcare Providers: IncredibleEmployment.be  This test is not yet approved or cleared by the [place] FDA and has been authorized for detection and/or diagnosis of SARS-CoV-2 by FDA under an Emergency Use Authorization (EUA). This EUA will remain in effect (meaning this test can be used) for the duration of the COVID-19 declaration under Section 564(b)(1) of the Act, 21 U.S.C. section 360bbb-3(b)(1), unless the authorization is terminated or revoked.  Performed at [facility], [address], [place], [place] [address]   CBG monitoring, ED     Status:  Abnormal   Collection Time: 09/02/21 11:22 PM  Result Value Ref Range   Glucose-Capillary 146 (H) 70 - 99 mg/dL    Comment: Glucose reference range applies only to samples taken after fasting for at least 8 hours.    Chemistries  Recent Labs  Lab 09/02/21 2018  NA 132*  K 4.3  CL 98  CO2 23  GLUCOSE 164*  BUN 16  CREATININE 1.20*  CALCIUM 9.7  AST 18  ALT 17  ALKPHOS 47  BILITOT 0.5   ------------------------------------------------------------------------------------------------------------------  ------------------------------------------------------------------------------------------------------------------ GFR: Estimated Creatinine Clearance: 42.3 mL/min (A) (by C-G formula based on SCr of 1.2 mg/dL (H)). Liver Function Tests: Recent Labs  Lab 09/02/21 2018  AST 18  ALT 17  ALKPHOS 47  BILITOT 0.5  PROT 6.1*  ALBUMIN 3.5   No results for input(s): LIPASE, AMYLASE in the last 168 hours. No results for input(s): AMMONIA in the last 168 hours. Coagulation Profile: Recent Labs  Lab 09/02/21 2018  INR 0.9   Cardiac Enzymes: No results for input(s): CKTOTAL, CKMB, CKMBINDEX, TROPONINI in the last 168 hours. BNP (last 3 results) No results for input(s): PROBNP in the last 8760 hours. HbA1C: No results for input(s): HGBA1C in the last 72 hours. CBG: Recent Labs  Lab 09/02/21 2023 09/02/21 2322  GLUCAP 171* 146*   Lipid Profile: No results for input(s): CHOL, HDL, LDLCALC, TRIG, CHOLHDL, LDLDIRECT in the last 72 hours. Thyroid Function Tests: No results for input(s): TSH, T4TOTAL, FREET4, T3FREE, THYROIDAB in the last 72 hours. Anemia Panel: No results for input(s): VITAMINB12, FOLATE, FERRITIN, TIBC, IRON, RETICCTPCT in the last 72 hours.  --------------------------------------------------------------------------------------------------------------- Urine analysis:    Component Value Date/Time   COLORURINE AMBER (A) 12/07/2020 1807   APPEARANCEUR  CLOUDY (A) 12/07/2020 1807   LABSPEC 1.025 12/07/2020 1807   PHURINE 6.0 12/07/2020 1807   GLUCOSEU 50 (A) 12/07/2020 1807   HGBUR NEGATIVE 12/07/2020 1807   BILIRUBINUR NEGATIVE 12/07/2020 1807   KETONESUR 20 (A) 12/07/2020 1807   PROTEINUR 100 (A) 12/07/2020 1807   UROBILINOGEN 1.0 01/10/2015 1754   NITRITE NEGATIVE 12/07/2020 1807   LEUKOCYTESUR TRACE (A) 12/07/2020 1807      Imaging Results:    DG Chest Port 1 View  Result Date: 09/02/2021 CLINICAL DATA:  Slurred speech. EXAM: PORTABLE CHEST 1 VIEW COMPARISON:  December 07, 2020 FINDINGS: Mild, hazy atelectasis and/or early infiltrate is seen within the bilateral lung bases and mid left lung. There is no evidence of a pleural effusion or pneumothorax. The heart size and mediastinal contours are within normal limits. There is moderate severity calcification of the aortic arch. The visualized skeletal structures are unremarkable. IMPRESSION: Mild bibasilar and mid left lung atelectasis and/or early infiltrate. Electronically Signed   By: Virgina Norfolk M.D.   On: 09/02/2021 23:38   CT HEAD CODE STROKE WO CONTRAST  Result Date: 09/02/2021 CLINICAL DATA:  Code stroke.  Slurred speech, left facial droop EXAM: CT HEAD WITHOUT CONTRAST TECHNIQUE: Contiguous axial images were obtained from the base of the skull through the vertex without intravenous contrast. COMPARISON:  None. FINDINGS: Brain: There are focal hypodensities in the right lentiform nucleus. The more medial of these foci is hypodense and may reflect a remote infarct; however, the more lateral lesion is less hypodense and is suspicious for subacute infarct. There is no evidence of acute intracranial hemorrhage or extra-axial fluid collection. Parenchymal volume is normal.  The ventricles are normal in size. There is no mass lesion.  There is no midline shift. Vascular: There is calcification of the bilateral cavernous ICAs. Skull: Normal. Negative for fracture or focal lesion.  Sinuses/Orbits: The paranasal sinuses are clear. The globes and orbits are unremarkable. Other: None. ASPECTS [facility] Stroke Program Early CT Score) - Ganglionic level infarction (caudate, lentiform nuclei, internal  capsule, insula, M1-M3 cortex): 6 - Supraganglionic infarction (M4-M6 cortex): 3 Total score (0-10 with 10 being normal): 9 IMPRESSION: 1. Small focal hypodensity in the right lentiform nucleus suspicious for subacute infarct. No evidence of intracranial hemorrhage. 2. ASPECTS is 9 These results were called by telephone at the time of interpretation on 09/02/2021 at 8:29 pm to provider JOSEPH ZAMMIT , who verbally acknowledged these results. Electronically Signed   By: Valetta Mole M.D.   On: 09/02/2021 20:31   CT ANGIO HEAD NECK W WO CM W PERF  Result Date: 09/02/2021 CLINICAL DATA:  Initial evaluation for acute left-sided weakness, dysarthria, left facial droop. EXAM: CT ANGIOGRAPHY HEAD AND NECK CT PERFUSION BRAIN TECHNIQUE: Multidetector CT imaging of the head and neck was performed using the standard protocol during bolus administration of intravenous contrast. Multiplanar CT image reconstructions and MIPs were obtained to evaluate the vascular anatomy. Carotid stenosis measurements (when applicable) are obtained utilizing NASCET criteria, using the distal internal carotid diameter as the denominator. Multiphase CT imaging of the brain was performed following IV bolus contrast injection. Subsequent parametric perfusion maps were calculated using RAPID software. CONTRAST:  188mL OMNIPAQUE IOHEXOL 350 MG/ML SOLN COMPARISON:  Prior noncontrast head CT from earlier the same day. FINDINGS: CTA NECK FINDINGS Aortic arch: Visualized aortic arch normal caliber with normal 3 vessel morphology. Moderate atheromatous change about the arch and origin of the great vessels without high-grade stenosis. Right carotid system: Right CCA patent without stenosis or occlusion. Mild atheromatous change about the  right carotid bifurcation without significant stenosis. Right ICA patent distally without stenosis, dissection or occlusion. Left carotid system: Left CCA patent from its origin to the bifurcation without stenosis. Mild for age atheromatous change about the left carotid bifurcation without significant stenosis. Possible small penetrating plaque versus occluded arterial branch noted arising from the bifurcation itself (series 6, image 210). Left ICA patent distally without stenosis, dissection or occlusion. Vertebral arteries: Both vertebral arteries arise from the subclavian arteries. Atheromatous change at the origins of both vertebral arteries with associated moderate approximate 50% stenosis on the left. Left vertebral artery is dominant. Vertebral arteries otherwise patent distally without stenosis, dissection or occlusion. Skeleton: No worrisome lytic or blastic osseous lesions. Moderate multilevel cervical spondylosis at C3-4 through C7-T1. Patient is edentulous. Other neck: No other acute soft tissue abnormality within the neck. No mass or adenopathy. Thyroid is hypoplastic and/or absent. Upper chest: Scattered ground-glass opacity with interlobular septal thickening seen within the visualized lungs, favored to reflect pulmonary interstitial congestion/edema. Remainder the visualized upper chest demonstrates no other acute finding. Review of the MIP images confirms the above findings CTA HEAD FINDINGS Anterior circulation: Petrous segments patent bilaterally. Atheromatous change within the carotid siphons with associated mild to moderate multifocal narrowing. A1 segments patent bilaterally. Left A1 hypoplastic. Normal anterior communicating artery complex. Anterior cerebral arteries irregular but patent to their distal aspects without high-grade stenosis. M1 segments are somewhat small but patent without hemodynamically significant stenosis. Normal MCA bifurcations. No proximal MCA branch occlusion. Moderate  small vessel atheromatous irregularity seen within the MCA branches bilaterally. Posterior circulation: Both V4 segments patent to the vertebrobasilar junction without stenosis. Both PICA origins patent and normal. Basilar patent to its distal aspect without stenosis. Superior cerebellar arteries patent bilaterally. Left PCA supplied via the basilar. Fetal type origin of the right PCA. Atheromatous irregularity within the PCAs bilaterally without high-grade stenosis. Venous sinuses: Grossly patent allowing for timing the contrast bolus. Non opacification of the left sigmoid sinus favored to  be related to timing of the contrast bolus. Anatomic variants: Fetal type origin of the right PCA. Hypoplastic left A1 segment. No aneurysm. Review of the MIP images confirms the above findings CT Brain Perfusion Findings: ASPECTS: 9 CBF (<30%) Volume: 61mL Perfusion (Tmax>6.0s) volume: 36mL Mismatch Volume: 69mL Infarction Location:Negative CT perfusion with no evidence for acute ischemia. No other perfusion deficit. IMPRESSION: 1. Negative CTA for emergent large vessel occlusion. 2. Negative CT perfusion with no evidence for acute ischemia or other perfusion abnormality. 3. 50% atheromatous stenosis at the origin of the dominant left vertebral artery. 4. Additional moderate atheromatous disease elsewhere about the major arterial vasculature of the head and neck as above. No other hemodynamically significant or correctable stenosis. 5. Scattered ground-glass opacity with interlobular septal thickening within the visualized lungs, favored to reflect pulmonary interstitial congestion/edema. Results were called by telephone at the time of interpretation on 09/02/2021 at 9:27 pm to provider Dr. Roderic Palau, Who verbally acknowledged these results. Electronically Signed   By: Jeannine Boga M.D.   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 Echo in the a.m. Monitor on telemetry Consult speech, OT, PT Inpatient consult neuro Routine monitor Acute metabolic encephalopathy Most likely secondary to stroke given onset of associated symptoms CT head showed hypodensity consistent for subacute stroke No leukocytosis, electrolyte abnormalities Possibility of infiltrate in the lungs, more likely atelectasis.  Distinguish between 2 with procalcitonin Check TSH Continue to monitor Diabetes mellitus type 2 Last hemoglobin A1c 8.6 Patient takes 60 units of basal insulin at home Reduced basal insulin dose to 45 units, sliding scale coverage Hyperlipidemia Continue statin Hypertension Holding antihypertensives Permissive hypertension Treat with labetalol or Vasotec IV for blood pressure greater than 220/120 Continue to monitor Thyroid disease Continue Synthroid Check TSH in a.m.  DVT Prophylaxis-   Heparin and SCDs  AM Labs Ordered, also please review Full Orders  Family Communication: No family at bedside Code Status:  full  Admission status: Observation Time spent in minutes : [employer]

## 2021-09-03 NOTE — ED Notes (Signed)
Spoke with Claudia [facility] nurse to obtain medication list.

## 2021-09-03 NOTE — TOC Progression Note (Addendum)
Transition of Care [facility]) - Progression Note    Patient Details  Name: TYERRA LORETTO MRN: [id number] Date of Birth: [birth year]  Transition of Care [facility]) CM/SW Contact  Shade Flood, LCSW Phone Number: 09/03/2021, 2:24 PM  Clinical Narrative:     Spoke with pt's son to provide bed offers and he accepts offer from [facility]. Updated Kitty at [facility] on acceptance and dc of 1-2 days. Assigned TOC will follow.  Kitty at [facility] states that they do not accept weekend admissions so if pt not ready for dc tomorrow, the soonest she can transfer is Monday.  Expected Discharge Plan: Skilled Nursing Facility Barriers to Discharge: Continued Medical Work up  Expected Discharge Plan and Services Expected Discharge Plan: [place] In-house Referral: Clinical Social Work   Post Acute Care Choice: [place] Living arrangements for the past 2 months: Apartment                                       Social Determinants of Health (SDOH) Interventions    Readmission Risk Interventions Readmission Risk Prevention Plan 12/08/2020  Transportation Screening Complete  HRI or [place] Complete  Social Work Consult for [place] Planning/Counseling Complete  Palliative Care Screening Not Applicable  Medication Review [employer]) Complete  Some recent data might be hidden

## 2021-09-03 NOTE — Evaluation (Signed)
Clinical/Bedside Swallow Evaluation Patient Details  Name: JONASIA COINER MRN: [id number] Date of Birth: 1951/06/25  Today's Date: 09/03/2021 Time: SLP Start Time (ACUTE ONLY): 1749 SLP Stop Time (ACUTE ONLY): 4496 SLP Time Calculation (min) (ACUTE ONLY): 24 min  Past Medical History:  Past Medical History:  Diagnosis Date   Arthritis    COPD (chronic obstructive pulmonary disease) ([place])    Diverticulosis    Essential hypertension, benign    Hx of colonic polyps    Hypothyroidism    IBS (irritable bowel syndrome)    Irritable bowel syndrome    Ketoacidosis    Mixed hyperlipidemia    PTSD (post-traumatic stress disorder)    S/P colonoscopy Jan 2011   RMR: pancolonic diverticula, polypoid rectal mucosa with  prominent lymphoid aggregates, repeat in Jan 2016    Shortness of breath dyspnea    Stress incontinence, female    Thyroid disease    Tobacco use    Type 2 diabetes mellitus ([place])    Past Surgical History:  Past Surgical History:  Procedure Laterality Date   Bilateral tubal ligation     BREAST BIOPSY Left 10/16/2014   negative   CHOLECYSTECTOMY  09/11/2012   Procedure: LAPAROSCOPIC CHOLECYSTECTOMY;  Surgeon: Jamesetta So, MD;  Location: [facility] ORS;  Service: General;  Laterality: N/A;   COLONOSCOPY  11/17/2009   Dr. Gala Romney: normal rectum, pancolonic diverticula, polyps benign, no adenomas.    COLONOSCOPY N/A 02/12/2015   Dr. Gala Romney: colonic diverticulosis, multiple colonic polyps (tubular adenomas), negative segmental biopsies. Surveillance in 2021   ESOPHAGOGASTRODUODENOSCOPY  11/23/2011   Dr. Gala Romney: Erosive reflux esophagitis; small hiatal hernia; esophagus dilated empirically   MALONEY DILATION  11/23/2011   Procedure: Venia Minks DILATION;  Surgeon: Daneil Dolin, MD;  Location: [facility] ENDO SUITE;  Service: Endoscopy;  Laterality: N/A;   SKIN LESION EXCISION     on buttocks   TUBAL LIGATION     HPI:  Kenly Henckel  is a 70 y.o. female, with history of COPD, hypertension,  hypothyroidism, mixed hyperlipidemia, tobacco use, diabetes mellitus type 2, PTSD, and more presents to ED with chief complaint of fall.  Apparently EMS was called out to her house for the second time today around [phone] when patient fell and hit her life alert button.  Patient is quite confused at the time of my exam.  She keeps reporting that she came in because of her sugar.  She is not sure if she is hyper or hypoglycemic per her report.  We asked her questions about fall or unilateral weakness, she just says "my sugar my sugar."  Per chart review patient had sudden onset of left-sided weakness and dysarthria-last known well 7:10 PM.  She is not a candidate for thrombolytic therapy as she had a subacute stroke on CT.  Telemetry neuro recommends permissive hypertension with treatment of blood pressure of 220/120 or greater. MRI shows: Multiple small evolving early subacute infarcts in the bilateral  basal ganglia, right more than left, with the largest focus in the  right lentiform nucleus.    Assessment / Plan / Recommendation  Clinical Impression  Clinical swallow evaluation completed in room (ED) with son present. Pt failed the [facility] swallow screen earlier today. Pt presents with left facial asymmetry and weakness resulting in reduced labial seal and spillage of liquids. Pt with intermittent coughing after sips of thin liquids. No coughing elicited when cued to take very small sips, however when left to self feeding, she became more impulsive and couging  persisted. Pt with improved performance with nectar thick liquids, but will need supervision due to impulsivity. Recommend D2 and NTL with 100% supervision for meal and feeder assist as needed. Medications can be presented crushed in puree. PO only when Pt is alert and upright. SLP will follow in acute setting and recomend f/u SLP post acute. SLP Visit Diagnosis: Dysphagia, unspecified (R13.10)    Aspiration Risk  Moderate aspiration risk;Risk for  inadequate nutrition/hydration    Diet Recommendation Dysphagia 2 (Fine chop);Nectar-thick liquid   Liquid Administration via: Cup Medication Administration: Crushed with puree Supervision: Staff to assist with self feeding;Full supervision/cueing for compensatory strategies Compensations: Slow rate;Small sips/bites;Lingual sweep for clearance of pocketing;Monitor for anterior loss Postural Changes: Seated upright at 90 degrees;Remain upright for at least 30 minutes after po intake    Other  Recommendations Oral Care Recommendations: Oral care BID;Staff/trained caregiver to provide oral care Other Recommendations: Order thickener from pharmacy;Prohibited food (jello, ice cream, thin soups);Clarify dietary restrictions    Recommendations for follow up therapy are one component of a multi-disciplinary discharge planning process, led by the attending physician.  Recommendations may be updated based on patient status, additional functional criteria and insurance authorization.  Follow up Recommendations Skilled Nursing facility      Frequency and Duration min 2x/week  1 week       Prognosis Prognosis for Safe Diet Advancement: Fair Barriers to Reach Goals: Severity of deficits      Swallow Study   General Date of Onset: 09/02/21 HPI: Cassandr Cederberg  is a 70 y.o. female, with history of COPD, hypertension, hypothyroidism, mixed hyperlipidemia, tobacco use, diabetes mellitus type 2, PTSD, and more presents to ED with chief complaint of fall.  Apparently EMS was called out to her house for the second time today around [phone] when patient fell and hit her life alert button.  Patient is quite confused at the time of my exam.  She keeps reporting that she came in because of her sugar.  She is not sure if she is hyper or hypoglycemic per her report.  We asked her questions about fall or unilateral weakness, she just says "my sugar my sugar."  Per chart review patient had sudden onset of left-sided  weakness and dysarthria-last known well 7:10 PM.  She is not a candidate for thrombolytic therapy as she had a subacute stroke on CT.  Telemetry neuro recommends permissive hypertension with treatment of blood pressure of 220/120 or greater. MRI shows: Multiple small evolving early subacute infarcts in the bilateral  basal ganglia, right more than left, with the largest focus in the  right lentiform nucleus. Type of Study: Bedside Swallow Evaluation Previous Swallow Assessment: n/a Diet Prior to this Study: NPO Temperature Spikes Noted: No Respiratory Status: Nasal cannula History of Recent Intubation: No Behavior/Cognition: Alert;Cooperative;Pleasant mood Oral Cavity Assessment: Within Functional Limits Oral Care Completed by SLP: Yes Oral Cavity - Dentition: Edentulous Vision: Functional for self-feeding Self-Feeding Abilities: Able to feed self;Needs set up Patient Positioning: Upright in bed Baseline Vocal Quality: Normal Volitional Cough: Weak Volitional Swallow: Unable to elicit    Oral/Motor/Sensory Function Overall Oral Motor/Sensory Function: Moderate impairment Facial ROM: Reduced left Facial Symmetry: Abnormal symmetry left Lingual ROM: Within Functional Limits Lingual Symmetry: Within Functional Limits Lingual Strength: Within Functional Limits Velum: Within Functional Limits Mandible: Within Functional Limits   Ice Chips Ice chips: Within functional limits Presentation: Spoon   Thin Liquid Thin Liquid: Impaired Presentation: Cup;Self Fed;Spoon;Straw Oral Phase Impairments: Reduced labial seal Oral Phase Functional  Implications: Left anterior spillage Pharyngeal  Phase Impairments: Suspected delayed Swallow;Cough - Immediate;Cough - Delayed    Nectar Thick Nectar Thick Liquid: Within functional limits Presentation: Cup;Self Fed;Straw   Honey Thick Honey Thick Liquid: Within functional limits Presentation: Cup;Self fed   Puree Puree: Impaired Presentation: Spoon;Self  Fed Oral Phase Impairments: Reduced lingual movement/coordination   Solid     Solid: Impaired Presentation: Spoon Oral Phase Impairments: Reduced lingual movement/coordination Oral Phase Functional Implications: Prolonged oral transit     Thank you,  Genene Churn, [place]  Kedric Bumgarner 09/03/2021,3:31 PM

## 2021-09-04 ENCOUNTER — Inpatient Hospital Stay (HOSPITAL_COMMUNITY)
Admit: 2021-09-04 | Discharge: 2021-09-04 | Disposition: A | Discharge status: Home / Self Care | Payer: Medicare (Managed Care) | Attending: Neurology | Admitting: Neurology

## 2021-09-04 ENCOUNTER — Telehealth: Payer: Self-pay | Admitting: Student

## 2021-09-04 DIAGNOSIS — R4182 Altered mental status, unspecified: Secondary | ICD-10-CM

## 2021-09-04 DIAGNOSIS — E538 Deficiency of other specified B group vitamins: Secondary | ICD-10-CM | POA: Diagnosis present

## 2021-09-04 LAB — VITAMIN B12: Vitamin B-12: 162 pg/mL — ABNORMAL LOW (ref 180–914)

## 2021-09-04 LAB — GLUCOSE, CAPILLARY: Glucose-Capillary: 158 mg/dL — ABNORMAL HIGH (ref 70–99)

## 2021-09-04 MED ORDER — FUROSEMIDE 20 MG PO TABS: 20.0000 mg | ORAL_TABLET | ORAL | Status: DC

## 2021-09-04 MED ORDER — SENNOSIDES-DOCUSATE SODIUM 8.6-50 MG PO TABS: 1.0000 | ORAL_TABLET | Freq: Every evening | ORAL | Status: DC | PRN

## 2021-09-04 MED ORDER — ATORVASTATIN CALCIUM 80 MG PO TABS: 80.0000 mg | ORAL_TABLET | Freq: Every day | ORAL | Status: DC

## 2021-09-04 MED ORDER — CYANOCOBALAMIN 2000 MCG PO TABS: 2000.0000 ug | ORAL_TABLET | Freq: Every day | ORAL | Status: DC

## 2021-09-04 MED ORDER — INSULIN DETEMIR 100 UNIT/ML ~~LOC~~ SOLN: 30.0000 [IU] | Freq: Every day | SUBCUTANEOUS | 11 refills | Status: DC

## 2021-09-04 MED ORDER — APIXABAN 5 MG PO TABS: 5.0000 mg | ORAL_TABLET | Freq: Two times a day (BID) | ORAL | Status: DC

## 2021-09-04 MED ORDER — APIXABAN 5 MG PO TABS
5.0000 mg | ORAL_TABLET | Freq: Two times a day (BID) | ORAL | Status: DC
  Administered 2021-09-04: 5 mg via ORAL
  Filled 2021-09-04: qty 1

## 2021-09-04 MED ORDER — ASPIRIN EC 81 MG PO TBEC: 81.0000 mg | DELAYED_RELEASE_TABLET | Freq: Every day | ORAL | Status: DC

## 2021-09-04 NOTE — Progress Notes (Addendum)
Patient presents with confusion with bilateral infarcts.  This suggests cardioembolic stroke. She had episode of transient atrial fibrillation during hospitalization February this year which is concerning. The patient should have a 30 day event monitor at minimum. Also suggest a TEE. Confusion seems other proportion to the infarcts seen on imaging.  Additional labs will be obtained for dementia and also an EEG.

## 2021-09-04 NOTE — Telephone Encounter (Addendum)
    Cardiology had been contacted by the admitting team for this patient due to her having a CVA and possibly needing a 30-day event monitor and TEE per neurology recommendations. In reviewing her chart, she had actually experienced paroxysmal atrial fibrillation in the past and Dr. Wynetta Emery did start her on Eliquis 5 mg twice daily at the time of discharge.  When I went to consent her for her TEE, she was only A&Ox1. I did call her son and reviewed the procedure and he does not wish for her to proceed with it at this time given that she is still far from baseline following her recent CVA. He says they may consider this in the future once she is further out from her stroke and pending her recovery, both physically and mentally as she has experienced altered mental status but also had memory issues prior to admission.   I will send a note to Dr. [name] today to see if he wishes for her to proceed with a 30-day monitor given that she has now been started on anticoagulation already. If so, this will be arranged by our office and sent to [facility].   Signed, Erma Heritage, PA-C 09/04/2021, 4:06 PM   Confirmed with Dr. [name] that the patient no longer needs a TEE or 30-day monitor given she has been started on anticoagulation.   Signed, Erma Heritage, PA-C 09/18/2021, 7:39 AM Pager: [phone]

## 2021-09-04 NOTE — Progress Notes (Signed)
EEG complete - results pending 

## 2021-09-04 NOTE — Progress Notes (Signed)
ANTICOAGULATION CONSULT NOTE - Initial Consult  Pharmacy Consult for apixaban Indication: atrial fibrillation  No Known Allergies  Patient Measurements: Height: 5\' 4"  (162.6 cm) Weight: 69.5 kg (153 lb 3.2 oz) IBW/kg (Calculated) : 54.7 Heparin Dosing Weight:   Vital Signs: Temp: 97.7 F (36.5 C) (10/28 0431) BP: 195/85 (10/28 0431) Pulse Rate: 100 (10/28 0431)  Labs: Recent Labs    09/02/21 2018 09/02/21 2024 09/03/21 0357  HGB 13.8 13.9 14.4  HCT 39.9 41.0 42.7  PLT 201  --  207  APTT 29  --   --   LABPROT 12.1  --   --   INR 0.9  --   --   CREATININE 1.20* 1.20* 1.06*    Estimated Creatinine Clearance: 47.9 mL/min (A) (by C-G formula based on SCr of 1.06 mg/dL (H)).   Medical History: Past Medical History:  Diagnosis Date   Arthritis    COPD (chronic obstructive pulmonary disease) ([place])    Diverticulosis    Essential hypertension, benign    Hx of colonic polyps    Hypothyroidism    IBS (irritable bowel syndrome)    Irritable bowel syndrome    Ketoacidosis    Mixed hyperlipidemia    PTSD (post-traumatic stress disorder)    S/P colonoscopy Jan 2011   RMR: pancolonic diverticula, polypoid rectal mucosa with  prominent lymphoid aggregates, repeat in Jan 2016    Shortness of breath dyspnea    Stress incontinence, female    Thyroid disease    Tobacco use    Type 2 diabetes mellitus ([place])      Assessment: Pharmacy consulted to dose apixaban in patient with atrial fibrillation.  Goal of Therapy:   Monitor platelets by anticoagulation protocol: Yes   Plan:  Apixaban 5 mg twice daily. Monitor H&H and s/s of bleeding.  Revonda Standard Gilman Olazabal 09/04/2021,11:41 AM

## 2021-09-04 NOTE — Evaluation (Signed)
Speech Language Pathology Evaluation Patient Details Name: Claudia Keller MRN: [id number] DOB: 21-Apr-1951 Today's Date: 09/04/2021 Time: 1030-1057 SLP Time Calculation (min) (ACUTE ONLY): 27 min  Problem List:  Patient Active Problem List   Diagnosis Date Noted   CVA (cerebral vascular accident) ([place]) 09/02/2021   Pneumonia due to COVID-19 virus 12/07/2020   Leukocytosis 12/07/2020   Hyperglycemia due to diabetes mellitus ([place]) 12/07/2020   Hypoalbuminemia 12/07/2020   Elevated AST (SGOT) 12/07/2020   Obesity (BMI 30-39.9) 12/07/2020   COPD (chronic obstructive pulmonary disease) ([place]) 12/07/2020   GERD (gastroesophageal reflux disease) 12/07/2020   Diabetic neuropathy ([place]) 12/07/2020   Depression 12/07/2020   Anxiety 12/07/2020   Acute respiratory disease due to COVID-19 virus 12/07/2020   Acute respiratory failure with hypoxia ([place]) 12/01/2020   Segmental colitis without complication (HCC)    Class 2 obesity due to excess calories with body mass index (BMI) of 37.0 to 37.9 in adult    Ischemic colitis ([place]) 03/19/2020   Noncompliance with medication regimen 03/18/2020   Acute renal failure superimposed on stage 3a chronic kidney disease ([place]) 03/18/2020   DKA (diabetic ketoacidoses) 03/17/2020   Bipolar II disorder ([place]) 04/17/2018   PTSD (post-traumatic stress disorder) 04/17/2018   Personal history of noncompliance with medical treatment, presenting hazards to health 08/17/2016   Emesis 05/20/2016   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 ([place]) 09/08/2015   Essential hypertension, benign 09/08/2015   Primary hypothyroidism 09/08/2015   Diverticulosis of colon without hemorrhage    Nausea vomiting and diarrhea 10/27/2011   Chest pain 06/24/2011   Hypercholesterolemia 06/24/2011   Tobacco abuse 06/24/2011   IRRITABLE BOWEL SYNDROME 11/12/2009   Diarrhea 11/12/2009   OTHER SYMPTOMS INVOLVING DIGESTIVE SYSTEM OTHER 11/12/2009   History of colonic polyps 11/12/2009    Closed fracture of metatarsal bone 11/30/2007   HIGH BLOOD PRESSURE 11/30/2007   Past Medical History:  Past Medical History:  Diagnosis Date   Arthritis    COPD (chronic obstructive pulmonary disease) ([place])    Diverticulosis    Essential hypertension, benign    Hx of colonic polyps    Hypothyroidism    IBS (irritable bowel syndrome)    Irritable bowel syndrome    Ketoacidosis    Mixed hyperlipidemia    PTSD (post-traumatic stress disorder)    S/P colonoscopy Jan 2011   RMR: pancolonic diverticula, polypoid rectal mucosa with  prominent lymphoid aggregates, repeat in Jan 2016    Shortness of breath dyspnea    Stress incontinence, female    Thyroid disease    Tobacco use    Type 2 diabetes mellitus ([place])    Past Surgical History:  Past Surgical History:  Procedure Laterality Date   Bilateral tubal ligation     BREAST BIOPSY Left 10/16/2014   negative   CHOLECYSTECTOMY  09/11/2012   Procedure: LAPAROSCOPIC CHOLECYSTECTOMY;  Surgeon: Jamesetta So, MD;  Location: [facility];  Service: General;  Laterality: N/A;   COLONOSCOPY  11/17/2009   Dr. Gala Romney: normal rectum, pancolonic diverticula, polyps benign, no adenomas.    COLONOSCOPY N/A 02/12/2015   Dr. Gala Romney: colonic diverticulosis, multiple colonic polyps (tubular adenomas), negative segmental biopsies. Surveillance in 2021   ESOPHAGOGASTRODUODENOSCOPY  11/23/2011   Dr. Gala Romney: Erosive reflux esophagitis; small hiatal hernia; esophagus dilated empirically   MALONEY DILATION  11/23/2011   Procedure: Venia Minks DILATION;  Surgeon: Daneil Dolin, MD;  Location: [facility];  Service: Endoscopy;  Laterality: N/A;   SKIN LESION EXCISION  on buttocks   TUBAL LIGATION     HPI:  70 y.o. female, with history of COPD, hypertension, hypothyroidism, mixed hyperlipidemia, tobacco use, diabetes mellitus type 2, PTSD, and more presents to ED with chief complaint of fall on 09/02/21.  Apparently EMS was called out to her house for the second time  today around [phone] when patient fell and hit her life alert button.  Patient is quite confused at the time of my exam.  She keeps reporting that she came in because of her sugar.  She is not sure if she is hyper or hypoglycemic per her report.  We asked her questions about fall or unilateral weakness, she just says "my sugar my sugar."  Per chart review patient had sudden onset of left-sided weakness and dysarthria-last known well 7:10 PM. Chest x-ray shows mild bibasilar and mid left lung atelectasis and/or early infiltrate  CT head shows focal hypodensity in the right lentiform nucleus suspicious for subacute infarct; BSE completed on 09/03/21 indicating need for Dysphagia 2 diet with nectar-thickened liquids.  SLE generated to assess speech/cognition.   Assessment / Plan / Recommendation Clinical Impression  Pt seen for partial speech/language assessment with dysarthric speech noted within words-phrases with repetition required for clarity often during assessment.  Pt with decreased attention/memory and impulsivity noted during SLE as pt continuously asked to use the bathroom.  Evaluation halted twice for pt to use the bathroom and attempted to continue with min success.  Pt able to follow simple 1-step directives with 80% accuracy, but 2-step were inconsistent and 50% accurate with max multimodal cues required to complete tasks related to OME (ie: open your mouth, stick out your tongue, etc.).  Pt oriented to self only.  Diet check attempted, but pt refused all intake this session.  Current diet of Dysphagia 2/nectar-thickened liquids reported to be tolerated well by nursing staff with pt requiring assistance/cueing for oral clearance during meals.  Pt with overall decreased memory/attention, awareness of deficits/safety and decreased verbal expression.  Overall, speech intelligibility judged to be 25-50% accurate in words-simple phrases with low vocal intensity noted and imprecise articulation.  Pt unable  to utilize strategies to improve overall articulation d/t attention deficits/impulsivity.  Pt also exhibits deficits in orientation and auditory comprehension past 1-step directives. Yes/no questions 60% accurate with personal information/biographical questions. Recommend ST f/u for speech/cognition and dysphagia.  SNF recommended with ST f/u for above mentioned deficits.    SLP Assessment  SLP Recommendation/Assessment: Patient needs continued Speech Language Pathology Services SLP Visit Diagnosis: Attention and concentration deficit;Cognitive communication deficit (R41.841);Dysarthria and anarthria (R47.1) Attention and concentration deficit following: Cerebral infarction    Recommendations for follow up therapy are one component of a multi-disciplinary discharge planning process, led by the attending physician.  Recommendations may be updated based on patient status, additional functional criteria and insurance authorization.    Follow Up Recommendations  24 hour supervision/assistance;Skilled Nursing facility;Other (comment) (or CIR if candidate)    Frequency and Duration min 2x/week  1 week      SLP Evaluation Cognition  Overall Cognitive Status: No family/caregiver present to determine baseline cognitive functioning Arousal/Alertness: Awake/alert Orientation Level: Oriented to person Month: September Day of Week: Incorrect Attention: Sustained Sustained Attention: Impaired Sustained Attention Impairment: Verbal basic;Functional basic Memory: Impaired Memory Impairment: Decreased recall of new information;Decreased short term memory Decreased Short Term Memory: Verbal basic;Functional basic Immediate Memory Recall:  (unable to fully assess) Awareness: Impaired Awareness Impairment: Anticipatory impairment Problem Solving: Impaired Problem Solving Impairment: Verbal basic;Functional  basic Behaviors: Restless;Impulsive;Perseveration;Physical agitation Safety/Judgment:  Impaired Comments: Pt frequently attempting to get up out of chair despite encouragement to stay in chair during session.       Comprehension  Auditory Comprehension Overall Auditory Comprehension: Impaired Yes/No Questions: Impaired Basic Biographical Questions: 26-50% accurate Basic Immediate Environment Questions: 0-24% accurate Commands: Impaired Two Step Basic Commands: 0-24% accurate Conversation: Other (comment) (pt communicated in primarily words-simple phrases) Other Conversation Comments: Dysarthric speech judged to be 25-50% intelligible Interfering Components: Working Marine scientist;Attention EffectiveTechniques: Repetition;Visual/Gestural cues Visual Recognition/Discrimination Discrimination: Not tested Reading Comprehension Reading Status: Not tested    Expression Expression Primary Mode of Expression: Verbal Verbal Expression Overall Verbal Expression: Impaired Level of Generative/Spontaneous Verbalization: Phrase Repetition: Impaired Level of Impairment: Word level Naming: Not tested Pragmatics: Unable to assess Interfering Components: Attention;Speech intelligibility Non-Verbal Means of Communication: Not applicable Written Expression Dominant Hand: Left Written Expression: Not tested   Oral / Motor  Oral Motor/Sensory Function Overall Oral Motor/Sensory Function: Mild impairment Facial ROM: Reduced left Facial Symmetry: Abnormal symmetry left Facial Strength: Reduced left Lingual ROM: Reduced left Lingual Symmetry: Abnormal symmetry left Lingual Strength: Reduced Motor Speech Overall Motor Speech: Other (comment) (DTA) Respiration: Within functional limits Phonation: Low vocal intensity Resonance: Within functional limits Articulation: Impaired Level of Impairment: Word Intelligibility: Intelligibility reduced Word: 25-49% accurate Phrase: 25-49% accurate Sentence: 0-24% accurate Conversation: 0-24% accurate Motor Planning: Not tested Motor Speech  Errors: Not applicable                       Elvina Sidle, M.S., CCC-SLP 09/04/2021, 12:19 PM

## 2021-09-04 NOTE — Discharge Summary (Addendum)
Physician Discharge Summary  Claudia Keller GLO:[id number] DOB: Jul 02, 1951 DOA: 09/02/2021  [facility] [place] date: 09/02/2021 Discharge date: 09/04/2021  Admitted From:  Home  Disposition: SNF  Recommendations for Outpatient Follow-up:  Follow up with neurologist in 1 month Please obtain CBC in one week to follow up hemoglobin Please check CBG 4 times per day and treat per facility protocol Patient has outpatient TEE arranged by cardiology and they will place a 30 day cardiac monitor at that time as part of stroke work up.  Bleeding precautions while on anticoagulation therapy.   NO SMOKING CIGARETTES Follow up EEG results with Dr. [name].   Please continue SLP speech therapy at SNF  Please recheck vitamin B12 level in 1 month  Discharge Condition: STABLE  CODE STATUS: FULL DIET: [place]   Brief Hospitalization Summary: Please see all hospital notes, images, labs for full details of the hospitalization. ADMISSION HPI:  Claudia Keller  is a 70 y.o. female, with history of COPD, hypertension, hypothyroidism, mixed hyperlipidemia, tobacco use, diabetes mellitus type 2, PTSD, and more presents to ED with chief complaint of fall.  Apparently EMS was called out to her house for the second time today around [phone] when patient fell and hit her life alert button.  Patient is quite confused at the time of my exam.  She keeps reporting that she came in because of her sugar.  She is not sure if she is hyper or hypoglycemic per her report.  We asked her questions about fall or unilateral weakness, she just says "my sugar my sugar." Per chart review patient had sudden onset of left-sided weakness and dysarthria-last known well 7:10 PM.  She is not a candidate for thrombolytic therapy as she had a subacute stroke on CT.  Telemetry neuro recommends permissive hypertension with treatment of blood pressure of  220/120 or greater.   In the ED Temp 98.5, heart rate 74-84, respiratory rate 11-, blood pressure 99/52, satting at 95% Leukocytosis White blood cell count 9.4, hemoglobin 13.8 Chemistry panel is unremarkable Respiratory panel negative Alcohol level is undetectable EKG shows a heart rate of 84, sinus rhythm, QTC 456 CTA of her head and neck showed negative CTA for emergent large vessel occlusion.  Negative CT perfusion with no evidence for acute ischemic or other perfusion abnormality.  50% stenosis dominant left vertebral artery.  Possible pulmonary edema  Chest x-ray shows mild bibasilar and mid left lung atelectasis and/or early infiltrate CT head shows focal hypodensity in the right lentiform nucleus suspicious for subacute infarct  HOSPITAL COURSE   Patient presented to the hospital with an acute CVA thought to be cardioembolic.  She has a history of remote atrial fibrillation that was seen during a hospitalization in February 2022.    She was seen by the inpatient neurologist Dr. [name] who recommended further evaluation with a TEE and 30-day cardiac event monitor.  Given that she had a documented record of atrial fibrillation and evidence of a cardioembolic stroke she has been started on full anticoagulation with apixaban 5 mg twice daily.  An outpatient TEE and 30-day cardiac monitor has been arranged by the cardiology service.  I spoke with Dr. Harl Bowie and Bernerd Pho regarding getting this set up.  The patient is discharging to a skilled nursing facility.  The patient was seen by speech therapy and has been started on a dysphagia diet noted above.  Patient was seen  by PT and OT and SNF was recommended.  The patient was started on a statin therapy and we are recommending that her blood glucose be monitored closely with CBG testing 4 times daily and her CBC needs to be retested regularly while she is on full anticoagulation with apixaban.  Commend CBC recheck in 1 week.  Pt has  underlying vascular dementia and her home meds restarted. EEG was ordered by neurologist.  Outpatient follow up with Dr. [name] recommended in 3-4 weeks.  Pt noted to have low B12 and will be started on daily oral B12 replacement. Recommend rechecking B12 level in 1 month.    Discharge Diagnoses:  Active Problems:   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 (HCC)   Essential hypertension, benign   Primary hypothyroidism   CVA (cerebral vascular accident) ([place])   B12 deficiency   Discharge Instructions:  Allergies as of 09/04/2021   No Known Allergies      Medication List     STOP taking these medications    guaiFENesin-dextromethorphan 100-10 MG/5ML syrup Commonly known as: ROBITUSSIN DM   nicotine polacrilex 4 MG gum Commonly known as: Nicorette   NovoLIN N FlexPen 100 UNIT/ML Kiwkpen Generic drug: Insulin NPH (Human) (Isophane)       TAKE these medications    albuterol 108 (90 Base) MCG/ACT inhaler Commonly known as: VENTOLIN HFA Inhale 2 puffs into the lungs every 4 (four) hours as needed for wheezing or shortness of breath.   apixaban 5 MG Tabs tablet Commonly known as: ELIQUIS Take 1 tablet (5 mg total) by mouth 2 (two) times daily.   atorvastatin 80 MG tablet Commonly known as: LIPITOR Take 1 tablet (80 mg total) by mouth at bedtime. What changed:  medication strength how much to take   cetirizine 10 MG tablet Commonly known as: ZYRTEC Take 10 mg by mouth daily.   colestipol 1 g tablet Commonly known as: COLESTID TAKE 2 TABLETS DAILY FOR DIARRHEA. DO NOT TAKE WITHIN 2 HOURS OF OTHER ORAL MEDICATIONS. HOLD FOR CONSTIPATION.   cyanocobalamin 2000 MCG tablet Commonly known as: CVS VITAMIN B12 Take 1 tablet (2,000 mcg total) by mouth daily.   diltiazem 120 MG 24 hr capsule Commonly known as: Cardizem CD Take 1 capsule (120 mg total) by mouth daily.   divalproex 500 MG 24 hr tablet Commonly known as: DEPAKOTE ER Take 2 tablets (1,000 mg  total) by mouth every evening.   divalproex 250 MG DR tablet Commonly known as: DEPAKOTE Take 1 tablet (250 mg total) by mouth daily.   FreeStyle Libre 2 Sensor Misc 1 Device by Does not apply route every 14 (fourteen) days.   furosemide 20 MG tablet Commonly known as: LASIX Take 1 tablet (20 mg total) by mouth every other day. Start taking on: September 07, 2021 What changed:  when to take this These instructions start on September 07, 2021. If you are unsure what to do until then, ask your doctor or other care provider.   gabapentin 100 MG capsule Commonly known as: NEURONTIN Take 100 mg by mouth 3 (three) times daily.   insulin detemir 100 UNIT/ML injection Commonly known as: LEVEMIR Inject 0.3 mLs (30 Units total) into the skin daily. Start taking on: September 05, 2021   levothyroxine 100 MCG tablet Commonly known as: SYNTHROID Take 2 tablets (200 mcg total) by mouth daily before breakfast.   lisinopril 20 MG tablet Commonly known as: ZESTRIL Take 20 mg by mouth daily.   mirtazapine  7.5 MG tablet Commonly known as: REMERON Take 1 tablet (7.5 mg total) by mouth at bedtime.   ondansetron 4 MG tablet Commonly known as: ZOFRAN Take 1 tablet (4 mg total) by mouth every 8 (eight) hours as needed for nausea or vomiting.   Oyster Shell Calcium w/D 500-200 MG-UNIT Tabs Take 1 tablet by mouth 2 (two) times daily.   pantoprazole 40 MG tablet Commonly known as: PROTONIX TAKE 1 TABLET BY MOUTH ONCE DAILY 30 MINTUES BEFORE BREAKFAST. What changed:  how much to take how to take this when to take this additional instructions   PARoxetine 40 MG tablet Commonly known as: PAXIL Take 1 tablet (40 mg total) by mouth daily.   QUEtiapine 100 MG tablet Commonly known as: SEROQUEL Take 1 tablet (100 mg total) by mouth at bedtime.   Quintet Blood Glucose Test test strip Generic drug: glucose blood Check blood sugar 2 times daily E11.65   senna-docusate 8.6-50 MG  tablet Commonly known as: Senokot-S Take 1 tablet by mouth at bedtime as needed for mild constipation.        Contact information for follow-up providers     Phillips Odor, MD. Schedule an appointment as soon as possible for a visit in 1 month(s).   Specialty: Neurology Why: Hospital Follow Up stroke Contact information: [place] [address] [phone]              Contact information for after-discharge care     Destination     [facility] Preferred SNF .   Service: Skilled Nursing Contact information: [address] [address] [phone]                    No Known Allergies Allergies as of 09/04/2021   No Known Allergies      Medication List     STOP taking these medications    guaiFENesin-dextromethorphan 100-10 MG/5ML syrup Commonly known as: ROBITUSSIN DM   nicotine polacrilex 4 MG gum Commonly known as: Nicorette   NovoLIN N FlexPen 100 UNIT/ML Kiwkpen Generic drug: Insulin NPH (Human) (Isophane)       TAKE these medications    albuterol 108 (90 Base) MCG/ACT inhaler Commonly known as: VENTOLIN HFA Inhale 2 puffs into the lungs every 4 (four) hours as needed for wheezing or shortness of breath.   apixaban 5 MG Tabs tablet Commonly known as: ELIQUIS Take 1 tablet (5 mg total) by mouth 2 (two) times daily.   atorvastatin 80 MG tablet Commonly known as: LIPITOR Take 1 tablet (80 mg total) by mouth at bedtime. What changed:  medication strength how much to take   cetirizine 10 MG tablet Commonly known as: ZYRTEC Take 10 mg by mouth daily.   colestipol 1 g tablet Commonly known as: COLESTID TAKE 2 TABLETS DAILY FOR DIARRHEA. DO NOT TAKE WITHIN 2 HOURS OF OTHER ORAL MEDICATIONS. HOLD FOR CONSTIPATION.   cyanocobalamin 2000 MCG tablet Commonly known as: CVS VITAMIN B12 Take 1 tablet (2,000 mcg total) by mouth daily.   diltiazem 120 MG 24 hr capsule Commonly  known as: Cardizem CD Take 1 capsule (120 mg total) by mouth daily.   divalproex 500 MG 24 hr tablet Commonly known as: DEPAKOTE ER Take 2 tablets (1,000 mg total) by mouth every evening.   divalproex 250 MG DR tablet Commonly known as: DEPAKOTE Take 1 tablet (250 mg total) by mouth daily.   FreeStyle Libre 2 Sensor Misc 1 Device by Does  not apply route every 14 (fourteen) days.   furosemide 20 MG tablet Commonly known as: LASIX Take 1 tablet (20 mg total) by mouth every other day. Start taking on: September 07, 2021 What changed:  when to take this These instructions start on September 07, 2021. If you are unsure what to do until then, ask your doctor or other care provider.   gabapentin 100 MG capsule Commonly known as: NEURONTIN Take 100 mg by mouth 3 (three) times daily.   insulin detemir 100 UNIT/ML injection Commonly known as: LEVEMIR Inject 0.3 mLs (30 Units total) into the skin daily. Start taking on: September 05, 2021   levothyroxine 100 MCG tablet Commonly known as: SYNTHROID Take 2 tablets (200 mcg total) by mouth daily before breakfast.   lisinopril 20 MG tablet Commonly known as: ZESTRIL Take 20 mg by mouth daily.   mirtazapine 7.5 MG tablet Commonly known as: REMERON Take 1 tablet (7.5 mg total) by mouth at bedtime.   ondansetron 4 MG tablet Commonly known as: ZOFRAN Take 1 tablet (4 mg total) by mouth every 8 (eight) hours as needed for nausea or vomiting.   Oyster Shell Calcium w/D 500-200 MG-UNIT Tabs Take 1 tablet by mouth 2 (two) times daily.   pantoprazole 40 MG tablet Commonly known as: PROTONIX TAKE 1 TABLET BY MOUTH ONCE DAILY 30 MINTUES BEFORE BREAKFAST. What changed:  how much to take how to take this when to take this additional instructions   PARoxetine 40 MG tablet Commonly known as: PAXIL Take 1 tablet (40 mg total) by mouth daily.   QUEtiapine 100 MG tablet Commonly known as: SEROQUEL Take 1 tablet (100 mg total) by mouth at  bedtime.   Quintet Blood Glucose Test test strip Generic drug: glucose blood Check blood sugar 2 times daily E11.65   senna-docusate 8.6-50 MG tablet Commonly known as: Senokot-S Take 1 tablet by mouth at bedtime as needed for mild constipation.        Procedures/Studies: MR BRAIN WO CONTRAST  Result Date: 09/03/2021 CLINICAL DATA:  Altered mental status and weakness for 3 days EXAM: MRI HEAD WITHOUT CONTRAST TECHNIQUE: Multiplanar, multiecho pulse sequences of the brain and surrounding structures were obtained without intravenous contrast. COMPARISON:  CT/CTA head and neck dated 1 day prior FINDINGS: Brain: There are multiple foci of diffusion restriction with associated T2/FLAIR signal abnormality in the right basal ganglia, with the largest focus in the right lentiform nucleus corresponding to the hypodensity seen on the CT head dated 1 day prior. There are two punctate foci of diffusion restriction in the left basal ganglia. Findings consistent with evolving early subacute infarcts. There is no associated hemorrhage. There is an additional remote lacunar infarct in the right lentiform nucleus. There is no evidence of acute intracranial hemorrhage or extra-axial fluid collection. There is mild global parenchymal volume loss. Additional foci of FLAIR signal abnormality in the subcortical and periventricular white matter are nonspecific but likely reflects sequela of mild chronic white matter microangiopathy. There is no solid mass lesion.  There is no midline shift. Vascular: Normal flow voids. Skull and upper cervical spine: Normal marrow signal. Sinuses/Orbits: The imaged paranasal sinuses are clear. The globes and orbits are unremarkable. Other: None. IMPRESSION: Multiple small evolving early subacute infarcts in the bilateral basal ganglia, right more than left, with the largest focus in the right lentiform nucleus. Electronically Signed   By: Valetta Mole M.D.   On: 09/03/2021 10:11   DG  [facility] 1 View  Result  Date: 09/02/2021 CLINICAL DATA:  Slurred speech. EXAM: PORTABLE CHEST 1 VIEW COMPARISON:  December 07, 2020 FINDINGS: Mild, hazy atelectasis and/or early infiltrate is seen within the bilateral lung bases and mid left lung. There is no evidence of a pleural effusion or pneumothorax. The heart size and mediastinal contours are within normal limits. There is moderate severity calcification of the aortic arch. The visualized skeletal structures are unremarkable. IMPRESSION: Mild bibasilar and mid left lung atelectasis and/or early infiltrate. Electronically Signed   By: Virgina Norfolk M.D.   On: 09/02/2021 23:38   ECHOCARDIOGRAM COMPLETE  Result Date: 09/03/2021    ECHOCARDIOGRAM REPORT   Patient Name:   Claudia Keller Date of Exam: 09/03/2021 Medical Rec #:  [id number]      Height:       64.0 in Accession #:    [id number]     Weight:       153.2 lb Date of Birth:  [birth year]      BSA:          1.747 m Patient Age:    70 years       BP:           177/86 mmHg Patient Gender: F              HR:           89 bpm. Exam Location:  [facility] Procedure: 2D Echo, Cardiac Doppler and Color Doppler Indications:    Stroke  History:        Patient has prior history of Echocardiogram examinations, most                 recent 12/07/2020. Stroke; Risk Factors:Hypertension, Diabetes                 and Current Smoker. COVID + 11/2020.  Sonographer:    Wenda Low Referring Phys: [id number] ASIA B [place]  1. Left ventricular ejection fraction, by estimation, is 65 to 70%. The left ventricle has normal function. The left ventricle has no regional wall motion abnormalities. There is mild left ventricular hypertrophy. Left ventricular diastolic parameters are consistent with Grade I diastolic dysfunction (impaired relaxation).  2. Right ventricular systolic function is normal. The right ventricular size is normal. There is normal pulmonary artery systolic pressure.  3. A small  pericardial effusion is present. The pericardial effusion is circumferential.  4. The mitral valve is normal in structure. No evidence of mitral valve regurgitation. No evidence of mitral stenosis.  5. The aortic valve is tricuspid. Aortic valve regurgitation is not visualized. No aortic stenosis is present.  6. The inferior vena cava is normal in size with greater than 50% respiratory variability, suggesting right atrial pressure of 3 mmHg. FINDINGS  Left Ventricle: Left ventricular ejection fraction, by estimation, is 65 to 70%. The left ventricle has normal function. The left ventricle has no regional wall motion abnormalities. The left ventricular internal cavity size was normal in size. There is  mild left ventricular hypertrophy. Left ventricular diastolic parameters are consistent with Grade I diastolic dysfunction (impaired relaxation). Normal left ventricular filling pressure. Right Ventricle: The right ventricular size is normal. No increase in right ventricular wall thickness. Right ventricular systolic function is normal. There is normal pulmonary artery systolic pressure. The tricuspid regurgitant velocity is 1.93 m/s, and  with an assumed right atrial pressure of 3 mmHg, the estimated right ventricular systolic pressure is 10.2 mmHg. Left Atrium: Left atrial size was normal in size. Right  Atrium: Right atrial size was normal in size. Pericardium: A small pericardial effusion is present. The pericardial effusion is circumferential. Mitral Valve: The mitral valve is normal in structure. No evidence of mitral valve regurgitation. No evidence of mitral valve stenosis. MV peak gradient, 4.9 mmHg. The mean mitral valve gradient is 2.0 mmHg. Tricuspid Valve: The tricuspid valve is normal in structure. Tricuspid valve regurgitation is mild . No evidence of tricuspid stenosis. Aortic Valve: The aortic valve is tricuspid. Aortic valve regurgitation is not visualized. No aortic stenosis is present. Aortic valve  mean gradient measures 4.0 mmHg. Aortic valve peak gradient measures 7.8 mmHg. Aortic valve area, by VTI measures 2.64 cm. Pulmonic Valve: The pulmonic valve was not well visualized. Pulmonic valve regurgitation is not visualized. No evidence of pulmonic stenosis. Aorta: The aortic root is normal in size and structure. Venous: The inferior vena cava is normal in size with greater than 50% respiratory variability, suggesting right atrial pressure of 3 mmHg. IAS/Shunts: No atrial level shunt detected by color flow Doppler.  LEFT VENTRICLE PLAX 2D LVIDd:         3.50 cm   Diastology LVIDs:         2.10 cm   LV e' medial:    5.55 cm/s LV PW:         1.30 cm   LV E/e' medial:  12.3 LV IVS:        1.30 cm   LV e' lateral:   6.64 cm/s LVOT diam:     2.00 cm   LV E/e' lateral: 10.3 LV SV:         75 LV SV Index:   43 LVOT Area:     3.14 cm  RIGHT VENTRICLE RV Basal diam:  2.65 cm RV Mid diam:    2.00 cm RV S prime:     11.40 cm/s TAPSE (M-mode): 2.0 cm LEFT ATRIUM             Index        RIGHT ATRIUM           Index LA diam:        3.40 cm 1.95 cm/m   RA Area:     14.30 cm LA Vol (A2C):   61.8 ml 35.38 ml/m  RA Volume:   32.10 ml  18.38 ml/m LA Vol (A4C):   52.7 ml 30.17 ml/m LA Biplane Vol: 62.8 ml 35.95 ml/m  AORTIC VALVE                    PULMONIC VALVE AV Area (Vmax):    2.29 cm     PV Vmax:       0.81 m/s AV Area (Vmean):   2.35 cm     PV Peak grad:  2.6 mmHg AV Area (VTI):     2.64 cm AV Vmax:           140.00 cm/s AV Vmean:          93.900 cm/s AV VTI:            0.284 m AV Peak Grad:      7.8 mmHg AV Mean Grad:      4.0 mmHg LVOT Vmax:         102.00 cm/s LVOT Vmean:        70.300 cm/s LVOT VTI:          0.239 m LVOT/AV VTI ratio: 0.84  AORTA Ao Root diam: 3.10 cm MITRAL  VALVE                TRICUSPID VALVE MV Area (PHT): 2.75 cm     TR Peak grad:   14.9 mmHg MV Area VTI:   2.07 cm     TR Vmax:        193.00 cm/s MV Peak grad:  4.9 mmHg MV Mean grad:  2.0 mmHg     SHUNTS MV Vmax:       1.11 m/s      Systemic VTI:  0.24 m MV Vmean:      60.2 cm/s    Systemic Diam: 2.00 cm MV Decel Time: 276 msec MV E velocity: 68.10 cm/s MV A velocity: 106.00 cm/s MV E/A ratio:  0.64 Carlyle Dolly MD Electronically signed by Carlyle Dolly MD Signature Date/Time: 09/03/2021/12:29:35 PM    Final    CT HEAD CODE STROKE WO CONTRAST  Result Date: 09/02/2021 CLINICAL DATA:  Code stroke.  Slurred speech, left facial droop EXAM: CT HEAD WITHOUT CONTRAST TECHNIQUE: Contiguous axial images were obtained from the base of the skull through the vertex without intravenous contrast. COMPARISON:  None. FINDINGS: Brain: There are focal hypodensities in the right lentiform nucleus. The more medial of these foci is hypodense and may reflect a remote infarct; however, the more lateral lesion is less hypodense and is suspicious for subacute infarct. There is no evidence of acute intracranial hemorrhage or extra-axial fluid collection. Parenchymal volume is normal.  The ventricles are normal in size. There is no mass lesion.  There is no midline shift. Vascular: There is calcification of the bilateral cavernous ICAs. Skull: Normal. Negative for fracture or focal lesion. Sinuses/Orbits: The paranasal sinuses are clear. The globes and orbits are unremarkable. Other: None. ASPECTS [facility] Stroke Program Early CT Score) - Ganglionic level infarction (caudate, lentiform nuclei, internal capsule, insula, M1-M3 cortex): 6 - Supraganglionic infarction (M4-M6 cortex): 3 Total score (0-10 with 10 being normal): 9 IMPRESSION: 1. Small focal hypodensity in the right lentiform nucleus suspicious for subacute infarct. No evidence of intracranial hemorrhage. 2. ASPECTS is 9 These results were called by telephone at the time of interpretation on 09/02/2021 at 8:29 pm to provider JOSEPH ZAMMIT , who verbally acknowledged these results. Electronically Signed   By: Valetta Mole M.D.   On: 09/02/2021 20:31   CT ANGIO HEAD NECK W WO CM W PERF  Result Date:  09/02/2021 CLINICAL DATA:  Initial evaluation for acute left-sided weakness, dysarthria, left facial droop. EXAM: CT ANGIOGRAPHY HEAD AND NECK CT PERFUSION BRAIN TECHNIQUE: Multidetector CT imaging of the head and neck was performed using the standard protocol during bolus administration of intravenous contrast. Multiplanar CT image reconstructions and MIPs were obtained to evaluate the vascular anatomy. Carotid stenosis measurements (when applicable) are obtained utilizing NASCET criteria, using the distal internal carotid diameter as the denominator. Multiphase CT imaging of the brain was performed following IV bolus contrast injection. Subsequent parametric perfusion maps were calculated using RAPID software. CONTRAST:  199mL OMNIPAQUE IOHEXOL 350 MG/ML SOLN COMPARISON:  Prior noncontrast head CT from earlier the same day. FINDINGS: CTA NECK FINDINGS Aortic arch: Visualized aortic arch normal caliber with normal 3 vessel morphology. Moderate atheromatous change about the arch and origin of the great vessels without high-grade stenosis. Right carotid system: Right CCA patent without stenosis or occlusion. Mild atheromatous change about the right carotid bifurcation without significant stenosis. Right ICA patent distally without stenosis, dissection or occlusion. Left carotid system: Left CCA patent from its origin to  the bifurcation without stenosis. Mild for age atheromatous change about the left carotid bifurcation without significant stenosis. Possible small penetrating plaque versus occluded arterial branch noted arising from the bifurcation itself (series 6, image 210). Left ICA patent distally without stenosis, dissection or occlusion. Vertebral arteries: Both vertebral arteries arise from the subclavian arteries. Atheromatous change at the origins of both vertebral arteries with associated moderate approximate 50% stenosis on the left. Left vertebral artery is dominant. Vertebral arteries otherwise patent  distally without stenosis, dissection or occlusion. Skeleton: No worrisome lytic or blastic osseous lesions. Moderate multilevel cervical spondylosis at C3-4 through C7-T1. Patient is edentulous. Other neck: No other acute soft tissue abnormality within the neck. No mass or adenopathy. Thyroid is hypoplastic and/or absent. Upper chest: Scattered ground-glass opacity with interlobular septal thickening seen within the visualized lungs, favored to reflect pulmonary interstitial congestion/edema. Remainder the visualized upper chest demonstrates no other acute finding. Review of the MIP images confirms the above findings CTA HEAD FINDINGS Anterior circulation: Petrous segments patent bilaterally. Atheromatous change within the carotid siphons with associated mild to moderate multifocal narrowing. A1 segments patent bilaterally. Left A1 hypoplastic. Normal anterior communicating artery complex. Anterior cerebral arteries irregular but patent to their distal aspects without high-grade stenosis. M1 segments are somewhat small but patent without hemodynamically significant stenosis. Normal MCA bifurcations. No proximal MCA branch occlusion. Moderate small vessel atheromatous irregularity seen within the MCA branches bilaterally. Posterior circulation: Both V4 segments patent to the vertebrobasilar junction without stenosis. Both PICA origins patent and normal. Basilar patent to its distal aspect without stenosis. Superior cerebellar arteries patent bilaterally. Left PCA supplied via the basilar. Fetal type origin of the right PCA. Atheromatous irregularity within the PCAs bilaterally without high-grade stenosis. Venous sinuses: Grossly patent allowing for timing the contrast bolus. Non opacification of the left sigmoid sinus favored to be related to timing of the contrast bolus. Anatomic variants: Fetal type origin of the right PCA. Hypoplastic left A1 segment. No aneurysm. Review of the MIP images confirms the above  findings CT Brain Perfusion Findings: ASPECTS: 9 CBF (<30%) Volume: 19mL Perfusion (Tmax>6.0s) volume: 19mL Mismatch Volume: 50mL Infarction Location:Negative CT perfusion with no evidence for acute ischemia. No other perfusion deficit. IMPRESSION: 1. Negative CTA for emergent large vessel occlusion. 2. Negative CT perfusion with no evidence for acute ischemia or other perfusion abnormality. 3. 50% atheromatous stenosis at the origin of the dominant left vertebral artery. 4. Additional moderate atheromatous disease elsewhere about the major arterial vasculature of the head and neck as above. No other hemodynamically significant or correctable stenosis. 5. Scattered ground-glass opacity with interlobular septal thickening within the visualized lungs, favored to reflect pulmonary interstitial congestion/edema. Results were called by telephone at the time of interpretation on 09/02/2021 at 9:27 pm to provider Dr. Roderic Palau, Who verbally acknowledged these results. Electronically Signed   By: Jeannine Boga M.D.   On: 09/02/2021 21:35     Subjective: No specific complaints.    Discharge Exam: Vitals:   09/04/21 0000 09/04/21 0431  BP: (!) 175/69 (!) 195/85  Pulse: 86 100  Resp: 18 18  Temp: 98.7 F (37.1 C) 97.7 F (36.5 C)  SpO2: 96% 97%   Vitals:   09/03/21 1552 09/03/21 2105 09/04/21 0000 09/04/21 0431  BP: (!) 180/88 (!) 175/70 (!) 175/69 (!) 195/85  Pulse: 89 87 86 100  Resp: 15 18 18 18   Temp: 97.6 F (36.4 C) 98.5 F (36.9 C) 98.7 F (37.1 C) 97.7 F (36.5 C)  TempSrc: Oral  SpO2: 99% 97% 96% 97%  Weight:      Height:       General: Pt is alert, awake, not in acute distress Cardiovascular: RRR, S1/S2 +, no rubs, no gallops Respiratory: CTA bilaterally, no wheezing, no rhonchi Abdominal: Soft, NT, ND, bowel sounds + Extremities: no edema, no cyanosis Neurological: strength 3/5 LUE/LLE   The results of significant diagnostics from this hospitalization (including imaging,  microbiology, ancillary and laboratory) are listed below for reference.     Microbiology: Recent Results (from the past 240 hour(s))  Resp Panel by RT-PCR (Flu A&B, Covid) Nasopharyngeal Swab     Status: None   Collection Time: 09/02/21  8:30 PM   Specimen: Nasopharyngeal Swab; Nasopharyngeal(NP) swabs in vial transport medium  Result Value Ref Range Status   SARS Coronavirus 2 by RT PCR NEGATIVE NEGATIVE Final    Comment: (NOTE) SARS-CoV-2 target nucleic acids are NOT DETECTED.  The SARS-CoV-2 RNA is generally detectable in upper respiratory specimens during the acute phase of infection. The lowest concentration of SARS-CoV-2 viral copies this assay can detect is 138 copies/mL. A negative result does not preclude SARS-Cov-2 infection and should not be used as the sole basis for treatment or other patient management decisions. A negative result may occur with  improper specimen collection/handling, submission of specimen other than nasopharyngeal swab, presence of viral mutation(s) within the areas targeted by this assay, and inadequate number of viral copies(<138 copies/mL). A negative result must be combined with clinical observations, patient history, and epidemiological information. The expected result is Negative.  Fact Sheet for Patients:  EntrepreneurPulse.com.au  Fact Sheet for Healthcare Providers:  IncredibleEmployment.be  This test is no t yet approved or cleared by the [place] FDA and  has been authorized for detection and/or diagnosis of SARS-CoV-2 by FDA under an Emergency Use Authorization (EUA). This EUA will remain  in effect (meaning this test can be used) for the duration of the COVID-19 declaration under Section 564(b)(1) of the Act, 21 U.S.C.section 360bbb-3(b)(1), unless the authorization is terminated  or revoked sooner.       Influenza A by PCR NEGATIVE NEGATIVE Final   Influenza B by PCR NEGATIVE NEGATIVE  Final    Comment: (NOTE) The Xpert Xpress SARS-CoV-2/FLU/RSV plus assay is intended as an aid in the diagnosis of influenza from Nasopharyngeal swab specimens and should not be used as a sole basis for treatment. Nasal washings and aspirates are unacceptable for Xpert Xpress SARS-CoV-2/FLU/RSV testing.  Fact Sheet for Patients: EntrepreneurPulse.com.au  Fact Sheet for Healthcare Providers: IncredibleEmployment.be  This test is not yet approved or cleared by the [place] FDA and has been authorized for detection and/or diagnosis of SARS-CoV-2 by FDA under an Emergency Use Authorization (EUA). This EUA will remain in effect (meaning this test can be used) for the duration of the COVID-19 declaration under Section 564(b)(1) of the Act, 21 U.S.C. section 360bbb-3(b)(1), unless the authorization is terminated or revoked.  Performed at [facility], [address] [place], [place] [address]      Labs: BNP (last 3 results) Recent Labs    12/01/20 1051 09/02/21 2018  BNP 174.0* 947.0*   Basic Metabolic Panel: Recent Labs  Lab 09/02/21 2018 09/02/21 2024 09/03/21 0357  NA 132* 133* 134*  K 4.3 4.4 3.7  CL 98 99 98  CO2 23  --  29  GLUCOSE 164* 163* 148*  BUN 16 15 17   CREATININE 1.20* 1.20* 1.06*  CALCIUM 9.7  --  9.8  Liver Function Tests: Recent Labs  Lab 09/02/21 2018 09/03/21 0357  AST 18 15  ALT 17 16  ALKPHOS 47 52  BILITOT 0.5 0.7  PROT 6.1* 6.4*  ALBUMIN 3.5 3.6   No results for input(s): LIPASE, AMYLASE in the last 168 hours. No results for input(s): AMMONIA in the last 168 hours. CBC: Recent Labs  Lab 09/02/21 2018 09/02/21 2024 09/03/21 0357  WBC 9.4  --  7.9  NEUTROABS 5.9  --   --   HGB 13.8 13.9 14.4  HCT 39.9 41.0 42.7  MCV 91.5  --  94.1  PLT 201  --  207   Cardiac Enzymes: No results for input(s): CKTOTAL, CKMB, CKMBINDEX, TROPONINI in the last 168 hours. BNP: Invalid input(s):  POCBNP CBG: Recent Labs  Lab 09/02/21 2322 09/03/21 0816 09/03/21 1210 09/03/21 1559 09/03/21 2106  GLUCAP 146* 202* 76 138* 120*   D-Dimer No results for input(s): DDIMER in the last 72 hours. Hgb A1c No results for input(s): HGBA1C in the last 72 hours. Lipid Profile Recent Labs    09/03/21 0357  CHOL 275*  HDL 39*  LDLCALC 202*  TRIG 169*  CHOLHDL 7.1   Thyroid function studies Recent Labs    09/03/21 0357  TSH 28.650*   Anemia work up Recent Labs    09/04/21 0959  VITAMINB12 162*   Urinalysis    Component Value Date/Time   COLORURINE YELLOW 09/03/2021 [place] 09/03/2021 0712   LABSPEC 1.027 09/03/2021 0712   PHURINE 6.0 09/03/2021 0712   GLUCOSEU NEGATIVE 09/03/2021 0712   HGBUR NEGATIVE 09/03/2021 0712   BILIRUBINUR NEGATIVE 09/03/2021 0712   KETONESUR NEGATIVE 09/03/2021 0712   PROTEINUR 100 (A) 09/03/2021 0712   UROBILINOGEN 1.0 01/10/2015 1754   NITRITE NEGATIVE 09/03/2021 0712   LEUKOCYTESUR NEGATIVE 09/03/2021 1914   Sepsis Labs Invalid input(s): PROCALCITONIN,  WBC,  LACTICIDVEN Microbiology Recent Results (from the past 240 hour(s))  Resp Panel by RT-PCR (Flu A&B, Covid) Nasopharyngeal Swab     Status: None   Collection Time: 09/02/21  8:30 PM   Specimen: Nasopharyngeal Swab; Nasopharyngeal(NP) swabs in vial transport medium  Result Value Ref Range Status   SARS Coronavirus 2 by RT PCR NEGATIVE NEGATIVE Final    Comment: (NOTE) SARS-CoV-2 target nucleic acids are NOT DETECTED.  The SARS-CoV-2 RNA is generally detectable in upper respiratory specimens during the acute phase of infection. The lowest concentration of SARS-CoV-2 viral copies this assay can detect is 138 copies/mL. A negative result does not preclude SARS-Cov-2 infection and should not be used as the sole basis for treatment or other patient management decisions. A negative result may occur with  improper specimen collection/handling, submission of  specimen other than nasopharyngeal swab, presence of viral mutation(s) within the areas targeted by this assay, and inadequate number of viral copies(<138 copies/mL). A negative result must be combined with clinical observations, patient history, and epidemiological information. The expected result is Negative.  Fact Sheet for Patients:  EntrepreneurPulse.com.au  Fact Sheet for Healthcare Providers:  IncredibleEmployment.be  This test is no t yet approved or cleared by the [place] FDA and  has been authorized for detection and/or diagnosis of SARS-CoV-2 by FDA under an Emergency Use Authorization (EUA). This EUA will remain  in effect (meaning this test can be used) for the duration of the COVID-19 declaration under Section 564(b)(1) of the Act, 21 U.S.C.section 360bbb-3(b)(1), unless the authorization is terminated  or revoked sooner.  Influenza A by PCR NEGATIVE NEGATIVE Final   Influenza B by PCR NEGATIVE NEGATIVE Final    Comment: (NOTE) The Xpert Xpress SARS-CoV-2/FLU/RSV plus assay is intended as an aid in the diagnosis of influenza from Nasopharyngeal swab specimens and should not be used as a sole basis for treatment. Nasal washings and aspirates are unacceptable for Xpert Xpress SARS-CoV-2/FLU/RSV testing.  Fact Sheet for Patients: EntrepreneurPulse.com.au  Fact Sheet for Healthcare Providers: IncredibleEmployment.be  This test is not yet approved or cleared by the [place] FDA and has been authorized for detection and/or diagnosis of SARS-CoV-2 by FDA under an Emergency Use Authorization (EUA). This EUA will remain in effect (meaning this test can be used) for the duration of the COVID-19 declaration under Section 564(b)(1) of the Act, 21 U.S.C. section 360bbb-3(b)(1), unless the authorization is terminated or revoked.  Performed at [facility], [address],  [place], [place] [address]    Time coordinating discharge: 39 mins   SIGNED:  Irwin Brakeman, MD  [facility] 09/04/2021, 12:27 PM How to contact the [facility] Attending or Consulting provider [place] or covering provider during after hours [place], for this patient?  Check the care team in [facility] and look for a) attending/consulting TRH provider listed and b) the [facility] team listed Log into [email or web address] and use [place]'s universal password to access. If you do not have the password, please contact the hospital operator. Locate the [facility] provider you are looking for under [facility] and page to a number that you can be directly reached. If you still have difficulty reaching the provider, please page the [facility] (Director on Call) for the Hospitalists listed on amion for assistance.

## 2021-09-04 NOTE — Discharge Instructions (Signed)
PATIENT IS SCHEDULED FOR AN OUTPATIENT TEE WITH CARDIOLOGY AS PART OF STROKE WORK UP AND FOR A 30 DAY CARDIAC MONITOR THAT CARDIOLOGY WILL PLACE ON HER WHEN SHE COMES FOR THE TEE STUDY.

## 2021-09-04 NOTE — TOC Transition Note (Signed)
Transition of Care [facility]) - CM/SW Discharge Note   Patient Details  Name: Claudia Keller MRN: [id number] Date of Birth: [birth year]  [facility]) CM/SW Contact:  Natasha Bence, LCSW Phone Number: 09/04/2021, 12:41 PM   Clinical Narrative:    CSW notified of patient's readiness for discharge. Kitty with [facility] agreeable to take patient. CSW faxed discharge summary. Nurse to call report. [facility] to provide transport. TOC signing off.  Final next level of care: Skilled Nursing Facility Barriers to Discharge: Barriers Resolved   Patient Goals and CMS Choice Patient states their goals for this hospitalization and ongoing recovery are:: rehab with SNF CMS Medicare.gov Compare Post Acute Care list provided to:: Patient Choice offered to / list presented to : Patient  Discharge Placement              Patient [facility] at: [facility] Patient to be transferred to facility by: [facility] Name of family member notified: no answer Reyne Dumas (Son)   [phone] Patient and family notified of of transfer: 09/04/21  Discharge Plan and Services In-house Referral: Clinical Social Work   Post Acute Care Choice: [place]                               Social Determinants of Health (SDOH) Interventions     Readmission Risk Interventions Readmission Risk Prevention Plan 12/08/2020  Transportation Screening Complete  HRI or Home Care Consult Complete  Social Work Consult for [place] Planning/Counseling Complete  Palliative Care Screening Not Applicable  Medication Review Press photographer) Complete  Some recent data might be hidden

## 2021-09-04 NOTE — Procedures (Signed)
Patient Name: Claudia Keller  MRN: [id number]  Epilepsy Attending: Lora Havens  Referring Physician/Provider: Dr Phillips Odor Date: 09/04/2021 Duration: 22.50 mins  Patient history: 70 year old female with altered mental status.  EEG to evaluate for seizure.  Level of alertness: Awake, asleep  AEDs during EEG study: Gabapentin, Depakote  Technical aspects: This EEG study was done with scalp electrodes positioned according to the 10-20 International system of electrode placement. Electrical activity was acquired at a sampling rate of 500Hz  and reviewed with a high frequency filter of 70Hz  and a low frequency filter of 1Hz . EEG data were recorded continuously and digitally stored.   Description: The posterior dominant rhythm consists of 8 Hz activity of moderate voltage (25-35 uV) seen predominantly in posterior head regions, symmetric and reactive to eye opening and eye closing. Sleep was characterized by vertex waves, sleep spindles (12 to 14 Hz), maximal frontocentral region. EEG showed intermittent generalized 3 to 6 Hz theta-delta slowing. Hyperventilation and photic stimulation were not performed.     ABNORMALITY - Intermittent slow, generalized  IMPRESSION: This study is suggestive of mild diffuse encephalopathy, nonspecific etiology. No seizures or epileptiform discharges were seen throughout the recording.  Taneasha Fuqua Barbra Sarks

## 2021-09-05 LAB — RPR: RPR Ser Ql: NONREACTIVE

## 2021-09-07 LAB — HOMOCYSTEINE: Homocysteine: 20.9 umol/L — ABNORMAL HIGH (ref 0.0–17.2)

## 2021-09-09 ENCOUNTER — Other Ambulatory Visit: Payer: Self-pay | Admitting: Hospitalist

## 2021-09-09 DIAGNOSIS — Z1231 Encounter for screening mammogram for malignant neoplasm of breast: Secondary | ICD-10-CM

## 2021-09-15 ENCOUNTER — Ambulatory Visit (INDEPENDENT_AMBULATORY_CARE_PROVIDER_SITE_OTHER): Payer: Medicare (Managed Care) | Admitting: Endocrinology

## 2021-09-15 ENCOUNTER — Other Ambulatory Visit: Payer: Self-pay

## 2021-09-15 VITALS — BP 150/64 | HR 86 | Ht 64.0 in | Wt 136.6 lb

## 2021-09-15 DIAGNOSIS — I129 Hypertensive chronic kidney disease with stage 1 through stage 4 chronic kidney disease, or unspecified chronic kidney disease: Secondary | ICD-10-CM | POA: Diagnosis not present

## 2021-09-15 DIAGNOSIS — E1122 Type 2 diabetes mellitus with diabetic chronic kidney disease: Secondary | ICD-10-CM | POA: Diagnosis not present

## 2021-09-15 DIAGNOSIS — N182 Chronic kidney disease, stage 2 (mild): Secondary | ICD-10-CM

## 2021-09-15 MED ORDER — INSULIN DETEMIR 100 UNIT/ML ~~LOC~~ SOLN: 30.0000 [IU] | SUBCUTANEOUS | 11 refills | Status: DC

## 2021-09-15 NOTE — Progress Notes (Signed)
Subjective:    Patient ID: Claudia Keller, female    DOB: [birth year], 70 y.o.   MRN: [id number]  HPI Pt returns for f/u of diabetes mellitus: DM type: 1 Dx'ed: 2542 Complications: PN and stage 3 CRI Therapy: insulin since 2018 GDM: never DKA: 2018 Severe hypoglycemia: never Pancreatitis: never Pancreatic imaging: normal on 2021 CT SDOH: she gets insulins from [facility] Other: she changed to qd insulin, after poor results with multiple daily injections Interval history: She recently had CVA.  She is in [place].  She does not know when she will go home.  The time of day that Levemir is given is not specified.   Past Medical History:  Diagnosis Date   Arthritis    COPD (chronic obstructive pulmonary disease) ([place])    Diverticulosis    Essential hypertension, benign    Hx of colonic polyps    Hypothyroidism    IBS (irritable bowel syndrome)    Irritable bowel syndrome    Ketoacidosis    Mixed hyperlipidemia    PTSD (post-traumatic stress disorder)    S/P colonoscopy Jan 2011   RMR: pancolonic diverticula, polypoid rectal mucosa with  prominent lymphoid aggregates, repeat in Jan 2016    Shortness of breath dyspnea    Stress incontinence, female    Thyroid disease    Tobacco use    Type 2 diabetes mellitus ([place])     Past Surgical History:  Procedure Laterality Date   Bilateral tubal ligation     BREAST BIOPSY Left 10/16/2014   negative   CHOLECYSTECTOMY  09/11/2012   Procedure: LAPAROSCOPIC CHOLECYSTECTOMY;  Surgeon: Jamesetta So, MD;  Location: [facility];  Service: General;  Laterality: N/A;   COLONOSCOPY  11/17/2009   Dr. Gala Romney: normal rectum, pancolonic diverticula, polyps benign, no adenomas.    COLONOSCOPY N/A 02/12/2015   Dr. Gala Romney: colonic diverticulosis, multiple colonic polyps (tubular adenomas), negative segmental biopsies. Surveillance in 2021   ESOPHAGOGASTRODUODENOSCOPY  11/23/2011   Dr. Gala Romney: Erosive reflux esophagitis; small hiatal hernia; esophagus dilated  empirically   MALONEY DILATION  11/23/2011   Procedure: Venia Minks DILATION;  Surgeon: Daneil Dolin, MD;  Location: [facility] END[facility] SUITE;  Service: Endoscopy;  Laterality: N/A;   SKIN LESION EXCISION     on buttocks   TUBAL LIGATION      Social History   Socioeconomic History   Marital status: Legally Separated    Spouse name: Not on file   Number of children: Not on file   Years of education: Not on file   Highest education level: Not on file  Occupational History   Not on file  Tobacco Use   Smoking status: Every Day    Packs/day: 2.00    Years: 43.00    Pack years: 86.00    Types: Cigarettes   Smokeless tobacco: Never  Substance and Sexual Activity   Alcohol use: No    Alcohol/week: 0.0 standard drinks   Drug use: No   Sexual activity: Not Currently    Birth control/protection: Surgical, Post-menopausal    Comment: tubal  Other Topics Concern   Not on file  Social History Narrative   Not on file   Social Determinants of Health   Financial Resource Strain: Not on file  Food Insecurity: No Food Insecurity   Worried About Running Out of Food in the Last Year: Never true   Ran Out of Food in the Last Year: Never true  Transportation Needs: No Transportation Needs   Lack of  Transportation (Medical): No   Lack of Transportation (Non-Medical): No  Physical Activity: Not on file  Stress: Not on file  Social Connections: Not on file  Intimate Partner Violence: Not on file    Current Outpatient Medications on File Prior to Visit  Medication Sig Dispense Refill   albuterol (VENTOLIN HFA) 108 (90 Base) MCG/ACT inhaler Inhale 2 puffs into the lungs every 4 (four) hours as needed for wheezing or shortness of breath. 8 g 1   apixaban (ELIQUIS) 5 MG TABS tablet Take 1 tablet (5 mg total) by mouth 2 (two) times daily. 60 tablet    atorvastatin (LIPITOR) 80 MG tablet Take 1 tablet (80 mg total) by mouth at bedtime.     Calcium Carb-Cholecalciferol (OYSTER SHELL CALCIUM W/D) 500-200  MG-UNIT TABS Take 1 tablet by mouth 2 (two) times daily.     cetirizine (ZYRTEC) 10 MG tablet Take 10 mg by mouth daily.     colestipol (COLESTID) 1 g tablet TAKE 2 TABLETS DAILY FOR DIARRHEA. DO NOT TAKE WITHIN 2 HOURS OF OTHER ORAL MEDICATIONS. HOLD FOR CONSTIPATION. 60 tablet 11   Continuous Blood Gluc Sensor (FREESTYLE LIBRE 2 SENSOR) MISC 1 Device by Does not apply route every 14 (fourteen) days. 6 each 3   cyanocobalamin ([employer] VITAMIN B12) 2000 MCG tablet Take 1 tablet (2,000 mcg total) by mouth daily.     diltiazem (CARDIZEM CD) 120 MG 24 hr capsule Take 1 capsule (120 mg total) by mouth daily. 30 capsule 11   divalproex (DEPAKOTE ER) 500 MG 24 hr tablet Take 2 tablets (1,000 mg total) by mouth every evening. 180 tablet 0   divalproex (DEPAKOTE) 250 MG DR tablet Take 1 tablet (250 mg total) by mouth daily. 90 tablet 0   furosemide (LASIX) 20 MG tablet Take 1 tablet (20 mg total) by mouth every other day. 30 tablet    gabapentin (NEURONTIN) 100 MG capsule Take 100 mg by mouth 3 (three) times daily.     glucose blood (QUINTET BLOOD GLUCOSE TEST) test strip Check blood sugar 2 times daily E11.65 100 each 2   levothyroxine (SYNTHROID) 100 MCG tablet Take 2 tablets (200 mcg total) by mouth daily before breakfast.     lisinopril (PRINIVIL,ZESTRIL) 20 MG tablet Take 20 mg by mouth daily.     mirtazapine (REMERON) 7.5 MG tablet Take 1 tablet (7.5 mg total) by mouth at bedtime. 90 tablet 0   ondansetron (ZOFRAN) 4 MG tablet Take 1 tablet (4 mg total) by mouth every 8 (eight) hours as needed for nausea or vomiting. 30 tablet 2   pantoprazole (PROTONIX) 40 MG tablet TAKE 1 TABLET BY MOUTH ONCE DAILY 30 MINTUES BEFORE BREAKFAST. (Patient taking differently: Take 40 mg by mouth daily before breakfast.) 30 tablet 11   PARoxetine (PAXIL) 40 MG tablet Take 1 tablet (40 mg total) by mouth daily. 90 tablet 0   QUEtiapine (SEROQUEL) 100 MG tablet Take 1 tablet (100 mg total) by mouth at bedtime. 90 tablet 0    senna-docusate (SENOKOT-S) 8.6-50 MG tablet Take 1 tablet by mouth at bedtime as needed for mild constipation.     No current facility-administered medications on file prior to visit.    No Known Allergies  Family History  Problem Relation Age of Onset   Schizophrenia Brother    Stroke Brother    Cancer Mother        unsure what type   Heart disease Father    Heart attack Father    Coronary  artery disease Other    Diabetes type II Other    Cancer Son        bladder   Colon cancer Neg Hx     BP (!) 150/64 (BP Location: Right Arm, Patient Position: Sitting, Cuff Size: Normal)   Pulse 86   Ht 5\' 4"  (1.626 m)   Wt 136 lb 9.6 oz (62 kg)   SpO2 95%   BMI 23.45 kg/m    Review of Systems     Objective:   Physical Exam VITAL SIGNS:  See vs page GENERAL: no distress. In [place].    Lab Results  Component Value Date   HGBA1C 8.6 (A) 08/27/2021      Assessment & Plan:  Type 1 DM: uncontrolled  Patient Instructions  check your blood sugar twice a day.  vary the time of day when you check, between before the 3 meals, and at bedtime.  also check if you have symptoms of your blood sugar being too high or too low.  please keep a record of the readings and bring it to your next appointment here (or you can bring the meter itself).  You can write it on any piece of paper.  please call us sooner if your blood sugar goes below 70, or if most of your readings are over 200.   Please change the Levemir to the morning. On this type of insulin schedule, you should eat meals on a regular schedule.  If a meal is missed or significantly delayed, your blood sugar could go low.    We are placing a continuous glucose monitor sensor today.  Please come back for a follow-up appointment in approx 2 weeks.

## 2021-09-15 NOTE — Patient Instructions (Addendum)
check your blood sugar twice a day.  vary the time of day when you check, between before the 3 meals, and at bedtime.  also check if you have symptoms of your blood sugar being too high or too low.  please keep a record of the readings and bring it to your next appointment here (or you can bring the meter itself).  You can write it on any piece of paper.  please call us sooner if your blood sugar goes below 70, or if most of your readings are over 200.   Please change the Levemir to the morning. On this type of insulin schedule, you should eat meals on a regular schedule.  If a meal is missed or significantly delayed, your blood sugar could go low.    We are placing a continuous glucose monitor sensor today.  Please come back for a follow-up appointment in approx 2 weeks.

## 2021-10-09 ENCOUNTER — Other Ambulatory Visit: Payer: Self-pay

## 2021-10-09 ENCOUNTER — Ambulatory Visit (INDEPENDENT_AMBULATORY_CARE_PROVIDER_SITE_OTHER): Payer: Medicare (Managed Care) | Admitting: Endocrinology

## 2021-10-09 VITALS — BP 160/80 | HR 85 | Ht 64.0 in | Wt 139.4 lb

## 2021-10-09 DIAGNOSIS — I129 Hypertensive chronic kidney disease with stage 1 through stage 4 chronic kidney disease, or unspecified chronic kidney disease: Secondary | ICD-10-CM | POA: Diagnosis not present

## 2021-10-09 DIAGNOSIS — N182 Chronic kidney disease, stage 2 (mild): Secondary | ICD-10-CM

## 2021-10-09 DIAGNOSIS — E1122 Type 2 diabetes mellitus with diabetic chronic kidney disease: Secondary | ICD-10-CM

## 2021-10-09 NOTE — Patient Instructions (Addendum)
check resident's blood sugar 4x a day a day: before the 3 meals, and at bedtime.  also check if you have symptoms of your blood sugar being too high or too low.  please keep a record of the readings and bring it to your next appointment here (or you can bring the meter itself).  You can write it on any piece of paper.  please call us sooner if your blood sugar goes below 70, or if most of your readings are over 200.   Please change the Levemir to 30 units QAM.   On this type of insulin schedule, you should eat meals on a regular schedule.  If a meal is missed or significantly delayed, your blood sugar could go low.    Please come back for a follow-up appointment in approx 6 weeks.

## 2021-10-09 NOTE — Progress Notes (Signed)
Subjective:    Patient ID: Claudia Keller, female    DOB: [birth year], 70 y.o.   MRN: [id number]  HPI Pt returns for f/u of diabetes mellitus: DM type: 1 Dx'ed: 7902 Complications: PN and stage 3 CRI Therapy: insulin since 2018 GDM: never DKA: 2018 Severe hypoglycemia: never Pancreatitis: never Pancreatic imaging: normal on 2021 CT SDOH: she gets insulins from [facility], when she is outpt; She is in [facility], since CVA.  Other: she changed to qd insulin, after poor results with multiple daily injections.   Interval history: She does not know when she will go home.  The time of day that Levemir is given is not specified.  Staff is unable to download CGM sensor.  No CBG record is sent with pt.   Past Medical History:  Diagnosis Date   Arthritis    COPD (chronic obstructive pulmonary disease) ([place])    Diverticulosis    Essential hypertension, benign    Hx of colonic polyps    Hypothyroidism    IBS (irritable bowel syndrome)    Irritable bowel syndrome    Ketoacidosis    Mixed hyperlipidemia    PTSD (post-traumatic stress disorder)    S/P colonoscopy Jan 2011   RMR: pancolonic diverticula, polypoid rectal mucosa with  prominent lymphoid aggregates, repeat in Jan 2016    Shortness of breath dyspnea    Stress incontinence, female    Thyroid disease    Tobacco use    Type 2 diabetes mellitus ([place])     Past Surgical History:  Procedure Laterality Date   Bilateral tubal ligation     BREAST BIOPSY Left 10/16/2014   negative   CHOLECYSTECTOMY  09/11/2012   Procedure: LAPAROSCOPIC CHOLECYSTECTOMY;  Surgeon: Jamesetta So, MD;  Location: [facility];  Service: General;  Laterality: N/A;   COLONOSCOPY  11/17/2009   Dr. Gala Romney: normal rectum, pancolonic diverticula, polyps benign, no adenomas.    COLONOSCOPY N/A 02/12/2015   Dr. Gala Romney: colonic diverticulosis, multiple colonic polyps (tubular adenomas), negative segmental biopsies. Surveillance in 2021   ESOPHAGOGASTRODUODENOSCOPY  11/23/2011    Dr. Gala Romney: Erosive reflux esophagitis; small hiatal hernia; esophagus dilated empirically   MALONEY DILATION  11/23/2011   Procedure: Venia Minks DILATION;  Surgeon: Daneil Dolin, MD;  Location: [facility];  Service: Endoscopy;  Laterality: N/A;   SKIN LESION EXCISION     on buttocks   TUBAL LIGATION      Social History   Socioeconomic History   Marital status: Legally Separated    Spouse name: Not on file   Number of children: Not on file   Years of education: Not on file   Highest education level: Not on file  Occupational History   Not on file  Tobacco Use   Smoking status: Every Day    Packs/day: 2.00    Years: 43.00    Pack years: 86.00    Types: Cigarettes   Smokeless tobacco: Never  Substance and Sexual Activity   Alcohol use: No    Alcohol/week: 0.0 standard drinks   Drug use: No   Sexual activity: Not Currently    Birth control/protection: Surgical, Post-menopausal    Comment: tubal  Other Topics Concern   Not on file  Social History Narrative   Not on file   Social Determinants of Health   Financial Resource Strain: Not on file  Food Insecurity: No Food Insecurity   Worried About Running Out of Food in the Last Year: Never true   Ran  Out of Food in the Last Year: Never true  Transportation Needs: No Transportation Needs   Lack of Transportation (Medical): No   Lack of Transportation (Non-Medical): No  Physical Activity: Not on file  Stress: Not on file  Social Connections: Not on file  Intimate Partner Violence: Not on file    Current Outpatient Medications on File Prior to Visit  Medication Sig Dispense Refill   albuterol (VENTOLIN HFA) 108 (90 Base) MCG/ACT inhaler Inhale 2 puffs into the lungs every 4 (four) hours as needed for wheezing or shortness of breath. 8 g 1   apixaban (ELIQUIS) 5 MG TABS tablet Take 1 tablet (5 mg total) by mouth 2 (two) times daily. 60 tablet    atorvastatin (LIPITOR) 80 MG tablet Take 1 tablet (80 mg total) by mouth  at bedtime.     Calcium Carb-Cholecalciferol (OYSTER SHELL CALCIUM W/D) 500-200 MG-UNIT TABS Take 1 tablet by mouth 2 (two) times daily.     cetirizine (ZYRTEC) 10 MG tablet Take 10 mg by mouth daily.     colestipol (COLESTID) 1 g tablet TAKE 2 TABLETS DAILY FOR DIARRHEA. DO NOT TAKE WITHIN 2 HOURS OF OTHER ORAL MEDICATIONS. HOLD FOR CONSTIPATION. 60 tablet 11   Continuous Blood Gluc Sensor (FREESTYLE LIBRE 2 SENSOR) MISC 1 Device by Does not apply route every 14 (fourteen) days. 6 each 3   cyanocobalamin ([employer] VITAMIN B12) 2000 MCG tablet Take 1 tablet (2,000 mcg total) by mouth daily.     diltiazem (CARDIZEM CD) 120 MG 24 hr capsule Take 1 capsule (120 mg total) by mouth daily. 30 capsule 11   divalproex (DEPAKOTE ER) 500 MG 24 hr tablet Take 2 tablets (1,000 mg total) by mouth every evening. 180 tablet 0   divalproex (DEPAKOTE) 250 MG DR tablet Take 1 tablet (250 mg total) by mouth daily. 90 tablet 0   furosemide (LASIX) 20 MG tablet Take 1 tablet (20 mg total) by mouth every other day. 30 tablet    gabapentin (NEURONTIN) 100 MG capsule Take 100 mg by mouth 3 (three) times daily.     glucose blood (QUINTET BLOOD GLUCOSE TEST) test strip Check blood sugar 2 times daily E11.65 100 each 2   insulin detemir (LEVEMIR) 100 UNIT/ML injection Inject 0.3 mLs (30 Units total) into the skin every morning. 10 mL 11   levothyroxine (SYNTHROID) 100 MCG tablet Take 2 tablets (200 mcg total) by mouth daily before breakfast.     lisinopril (PRINIVIL,ZESTRIL) 20 MG tablet Take 20 mg by mouth daily.     mirtazapine (REMERON) 7.5 MG tablet Take 1 tablet (7.5 mg total) by mouth at bedtime. 90 tablet 0   ondansetron (ZOFRAN) 4 MG tablet Take 1 tablet (4 mg total) by mouth every 8 (eight) hours as needed for nausea or vomiting. 30 tablet 2   pantoprazole (PROTONIX) 40 MG tablet TAKE 1 TABLET BY MOUTH ONCE DAILY 30 MINTUES BEFORE BREAKFAST. (Patient taking differently: Take 40 mg by mouth daily before breakfast.) 30  tablet 11   PARoxetine (PAXIL) 40 MG tablet Take 1 tablet (40 mg total) by mouth daily. 90 tablet 0   QUEtiapine (SEROQUEL) 100 MG tablet Take 1 tablet (100 mg total) by mouth at bedtime. 90 tablet 0   senna-docusate (SENOKOT-S) 8.6-50 MG tablet Take 1 tablet by mouth at bedtime as needed for mild constipation.     No current facility-administered medications on file prior to visit.    No Known Allergies  Family History  Problem Relation Age  of Onset   Schizophrenia Brother    Stroke Brother    Cancer Mother        unsure what type   Heart disease Father    Heart attack Father    Coronary artery disease Other    Diabetes type II Other    Cancer Son        bladder   Colon cancer Neg Hx     BP (!) 160/80   Pulse 85   Ht 5\' 4"  (1.626 m)   Wt 139 lb 6.4 oz (63.2 kg)   SpO2 96%   BMI 23.93 kg/m    Review of Systems     Objective:   Physical Exam    Lab Results  Component Value Date   HGBA1C 8.6 (A) 08/27/2021      Assessment & Plan:  type 1 DM: uncontrolled.  we need CBG or CGM info in order to safely manage meds.    Patient Instructions  check resident's blood sugar 4x a day a day: before the 3 meals, and at bedtime.  also check if you have symptoms of your blood sugar being too high or too low.  please keep a record of the readings and bring it to your next appointment here (or you can bring the meter itself).  You can write it on any piece of paper.  please call us sooner if your blood sugar goes below 70, or if most of your readings are over 200.   Please change the Levemir to 30 units QAM.   On this type of insulin schedule, you should eat meals on a regular schedule.  If a meal is missed or significantly delayed, your blood sugar could go low.    Please come back for a follow-up appointment in approx 6 weeks.

## 2021-10-14 ENCOUNTER — Ambulatory Visit
Admission: RE | Admit: 2021-10-14 | Discharge: 2021-10-14 | Disposition: A | Discharge status: Home / Self Care | Payer: Medicare (Managed Care) | Source: Ambulatory Visit | Attending: Hospitalist | Admitting: Hospitalist

## 2021-10-14 DIAGNOSIS — Z1231 Encounter for screening mammogram for malignant neoplasm of breast: Secondary | ICD-10-CM

## 2021-10-14 IMAGING — MG MM DIGITAL SCREENING BILAT W/ TOMO AND CAD
6 of 10 series · 6 of 30 positions shown · non-contrast
Comparison: Previous exam(s).

CLINICAL DATA: Screening.

EXAM:
DIGITAL SCREENING BILATERAL MAMMOGRAM WITH TOMOSYNTHESIS AND CAD
TECHNIQUE: Bilateral screening digital craniocaudal and mediolateral oblique
mammograms were obtained. Bilateral screening digital breast
tomosynthesis was performed. The images were evaluated with
computer-aided detection.

[L CC synth-2D (1 of 2)]
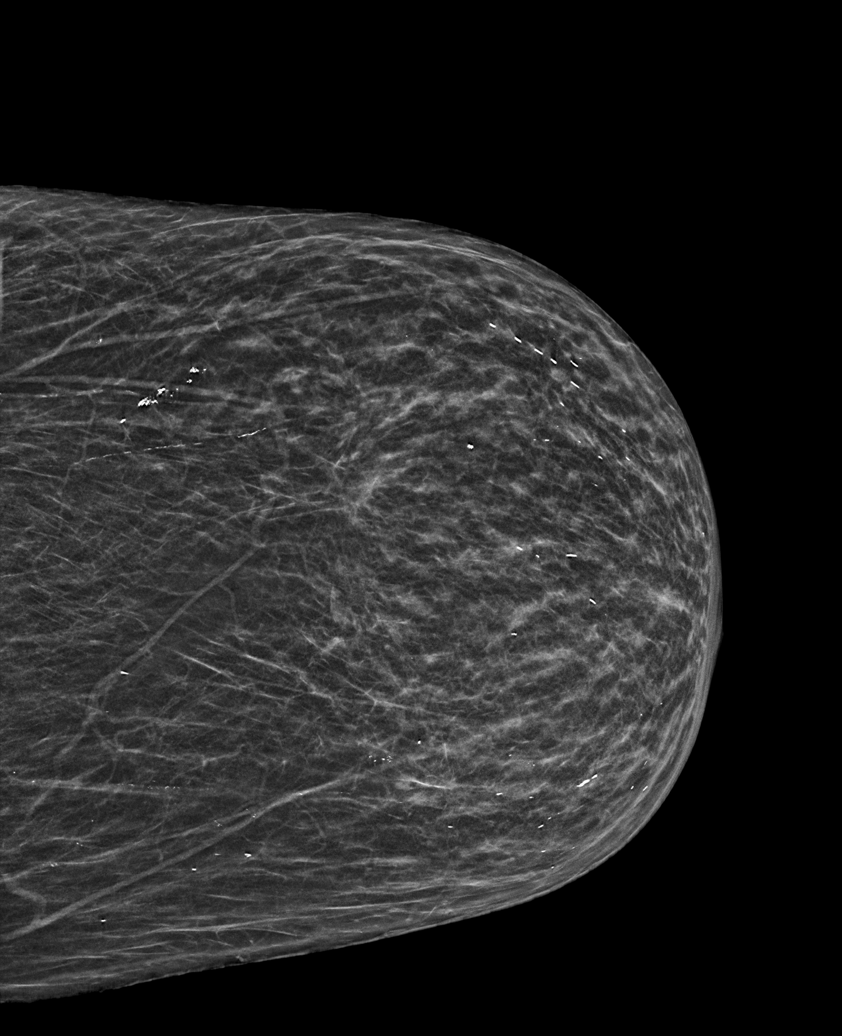

[R MLO synth-2D (1 of 2)]
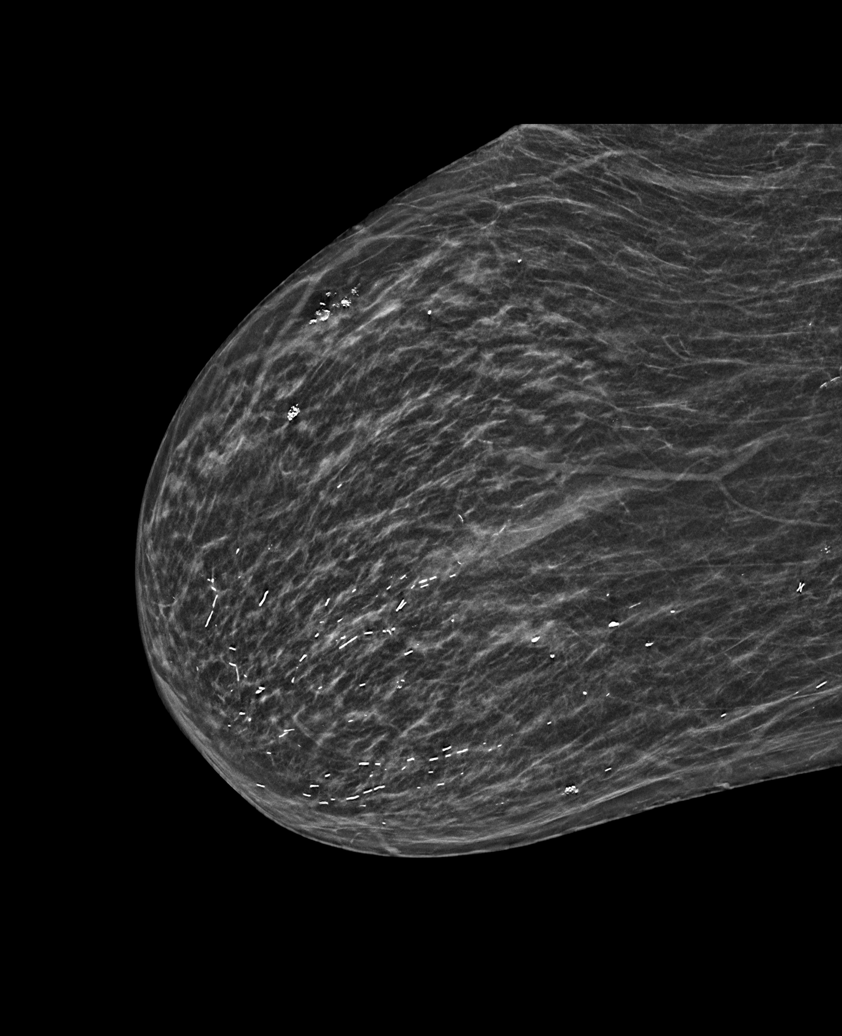

[L CC synth-2D (2 of 2)]
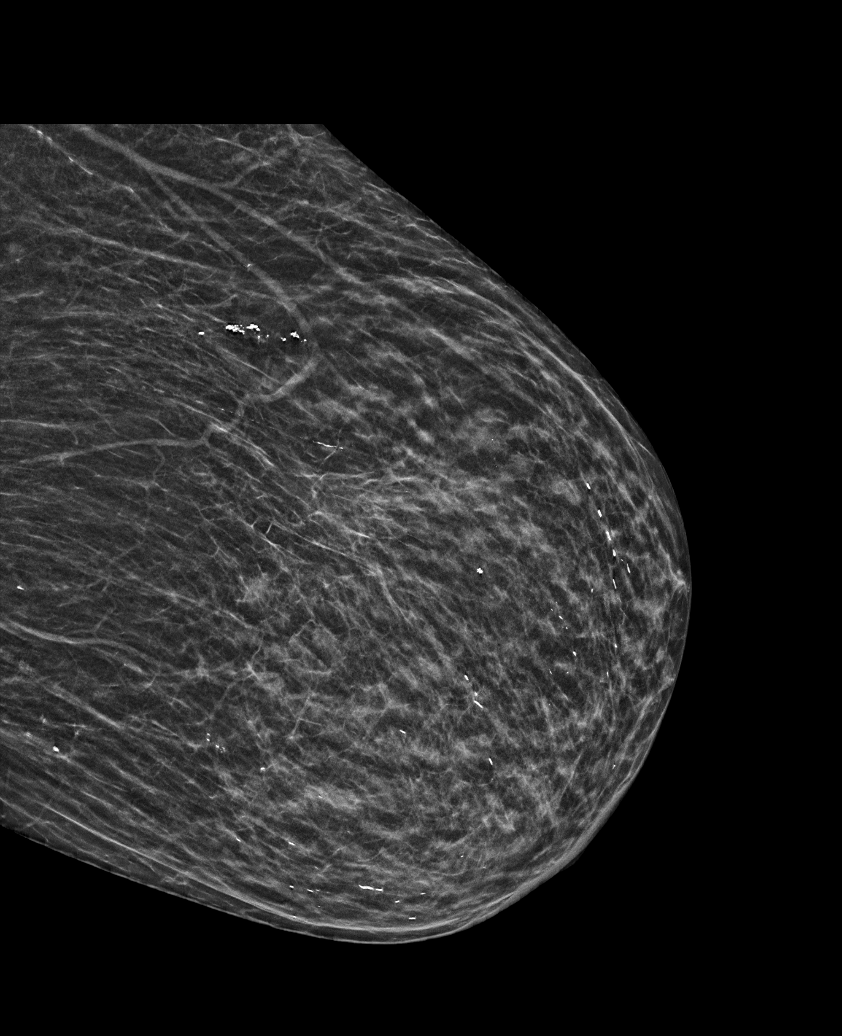

[R MLO synth-2D (2 of 2)]
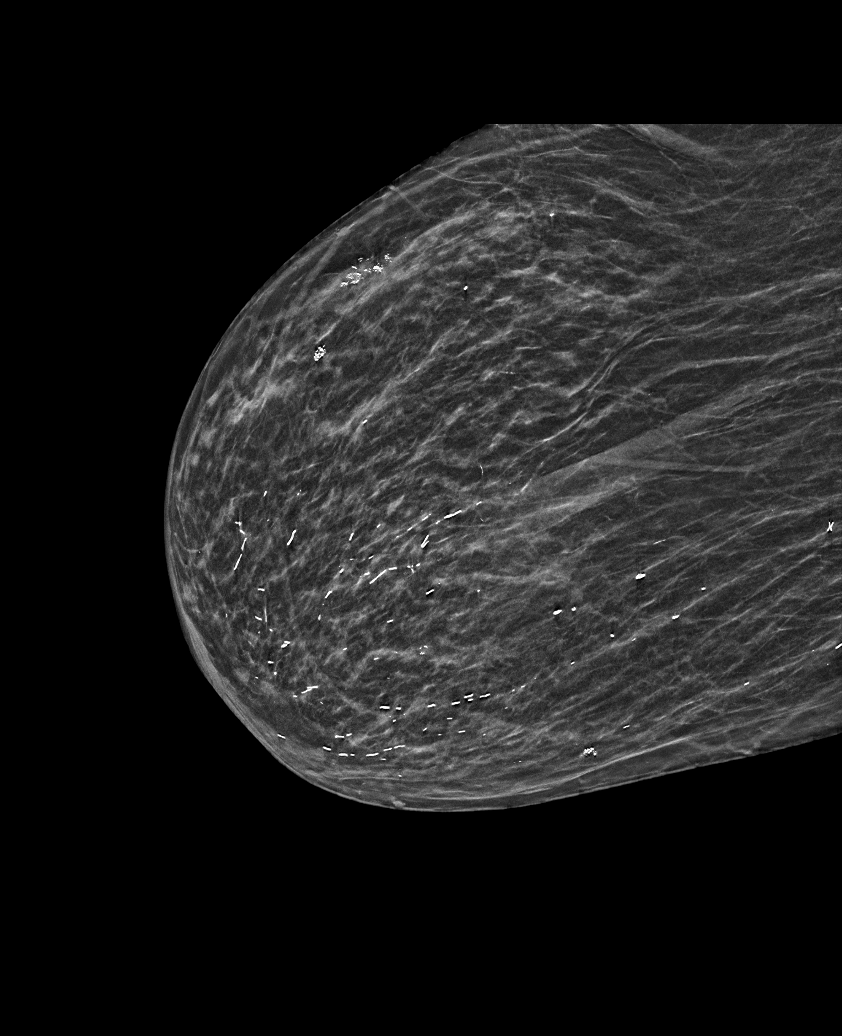

[R CC synth-2D]
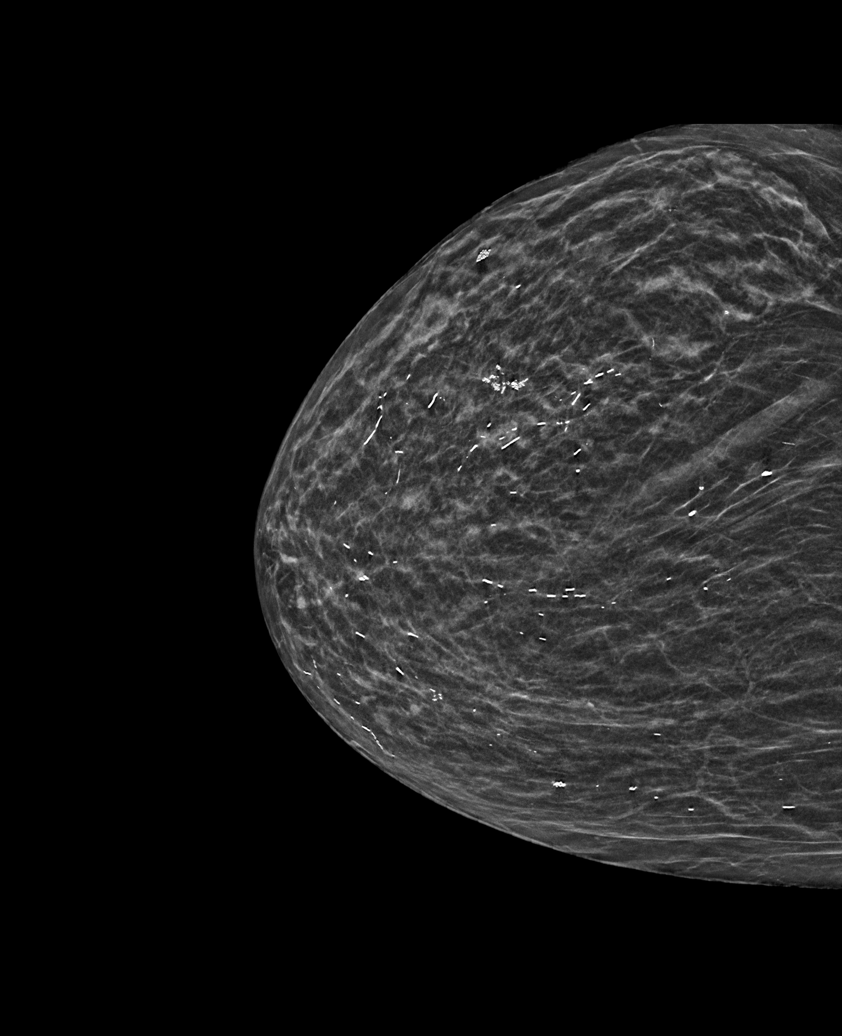

[R MLO tomo · tomo slice 21/42.0]
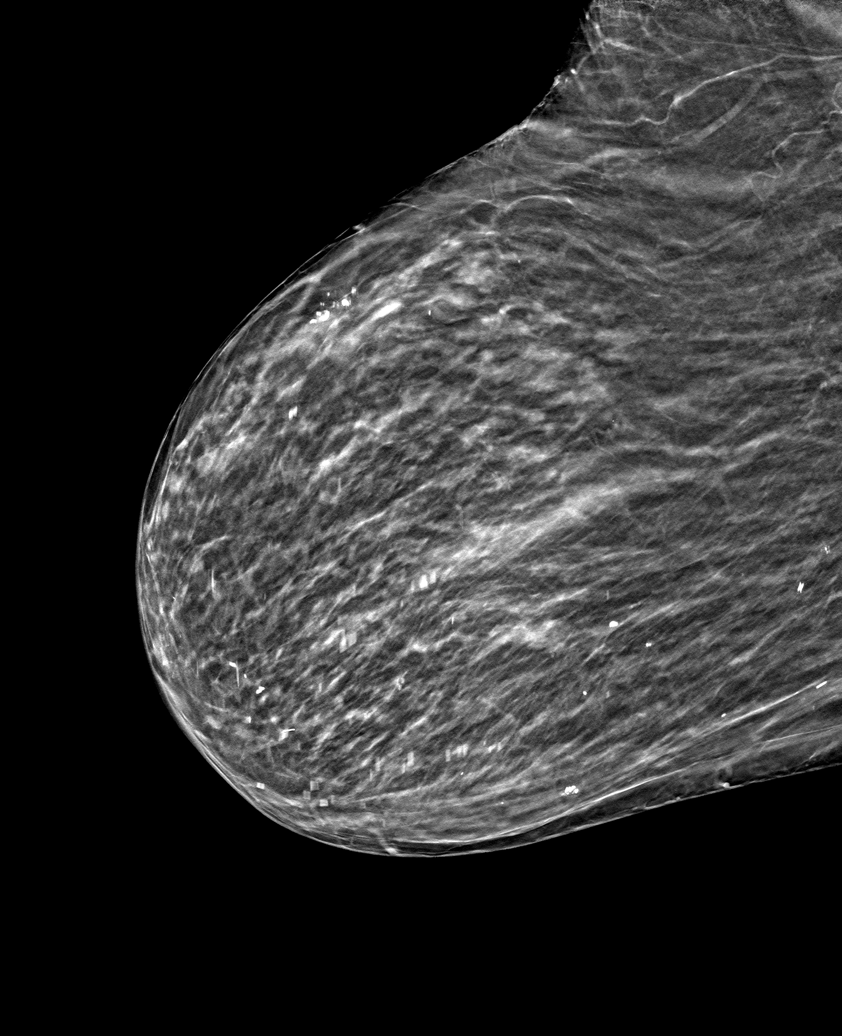

[6 of 30 positions shown; findings below may reference images not displayed]

ACR Breast Density Category b: There are scattered areas of
fibroglandular density.
FINDINGS: There are no findings suspicious for malignancy.
IMPRESSION: No mammographic evidence of malignancy. A result letter of this
screening mammogram will be mailed directly to the patient.

RECOMMENDATION:
Screening mammogram in one year. (Code:[BY])

BI-RADS CATEGORY  1: Negative.

## 2021-11-04 ENCOUNTER — Emergency Department (HOSPITAL_COMMUNITY): Payer: Medicare (Managed Care)

## 2021-11-04 ENCOUNTER — Inpatient Hospital Stay (HOSPITAL_COMMUNITY)
Admission: EM | Admit: 2021-11-04 | Discharge: 2021-11-10 | DRG: 314 | Disposition: A | Discharge status: Skilled Nursing Facility | Payer: Medicare (Managed Care) | Source: Skilled Nursing Facility | Attending: Internal Medicine | Admitting: Internal Medicine

## 2021-11-04 DIAGNOSIS — I5030 Unspecified diastolic (congestive) heart failure: Secondary | ICD-10-CM | POA: Diagnosis present

## 2021-11-04 DIAGNOSIS — D6832 Hemorrhagic disorder due to extrinsic circulating anticoagulants: Secondary | ICD-10-CM | POA: Diagnosis present

## 2021-11-04 DIAGNOSIS — R0902 Hypoxemia: Secondary | ICD-10-CM

## 2021-11-04 DIAGNOSIS — K219 Gastro-esophageal reflux disease without esophagitis: Secondary | ICD-10-CM | POA: Diagnosis present

## 2021-11-04 DIAGNOSIS — Z7901 Long term (current) use of anticoagulants: Secondary | ICD-10-CM

## 2021-11-04 DIAGNOSIS — T45515A Adverse effect of anticoagulants, initial encounter: Secondary | ICD-10-CM | POA: Diagnosis present

## 2021-11-04 DIAGNOSIS — I10 Essential (primary) hypertension: Secondary | ICD-10-CM | POA: Diagnosis present

## 2021-11-04 DIAGNOSIS — I5033 Acute on chronic diastolic (congestive) heart failure: Secondary | ICD-10-CM | POA: Diagnosis present

## 2021-11-04 DIAGNOSIS — N390 Urinary tract infection, site not specified: Secondary | ICD-10-CM

## 2021-11-04 DIAGNOSIS — Z8616 Personal history of COVID-19: Secondary | ICD-10-CM

## 2021-11-04 DIAGNOSIS — E039 Hypothyroidism, unspecified: Secondary | ICD-10-CM | POA: Diagnosis present

## 2021-11-04 DIAGNOSIS — N182 Chronic kidney disease, stage 2 (mild): Secondary | ICD-10-CM | POA: Diagnosis present

## 2021-11-04 DIAGNOSIS — F32A Depression, unspecified: Secondary | ICD-10-CM | POA: Diagnosis present

## 2021-11-04 DIAGNOSIS — I129 Hypertensive chronic kidney disease with stage 1 through stage 4 chronic kidney disease, or unspecified chronic kidney disease: Secondary | ICD-10-CM | POA: Diagnosis present

## 2021-11-04 DIAGNOSIS — Z8249 Family history of ischemic heart disease and other diseases of the circulatory system: Secondary | ICD-10-CM

## 2021-11-04 DIAGNOSIS — E119 Type 2 diabetes mellitus without complications: Secondary | ICD-10-CM | POA: Diagnosis present

## 2021-11-04 DIAGNOSIS — I130 Hypertensive heart and chronic kidney disease with heart failure and stage 1 through stage 4 chronic kidney disease, or unspecified chronic kidney disease: Secondary | ICD-10-CM | POA: Diagnosis present

## 2021-11-04 DIAGNOSIS — I3139 Other pericardial effusion (noninflammatory): Secondary | ICD-10-CM | POA: Diagnosis present

## 2021-11-04 DIAGNOSIS — N179 Acute kidney failure, unspecified: Secondary | ICD-10-CM | POA: Diagnosis present

## 2021-11-04 DIAGNOSIS — F17210 Nicotine dependence, cigarettes, uncomplicated: Secondary | ICD-10-CM | POA: Diagnosis present

## 2021-11-04 DIAGNOSIS — I312 Hemopericardium, not elsewhere classified: Secondary | ICD-10-CM | POA: Diagnosis not present

## 2021-11-04 DIAGNOSIS — R4182 Altered mental status, unspecified: Secondary | ICD-10-CM

## 2021-11-04 DIAGNOSIS — I69354 Hemiplegia and hemiparesis following cerebral infarction affecting left non-dominant side: Secondary | ICD-10-CM

## 2021-11-04 DIAGNOSIS — Z86010 Personal history of colonic polyps: Secondary | ICD-10-CM

## 2021-11-04 DIAGNOSIS — Z823 Family history of stroke: Secondary | ICD-10-CM

## 2021-11-04 DIAGNOSIS — N183 Chronic kidney disease, stage 3 unspecified: Secondary | ICD-10-CM

## 2021-11-04 DIAGNOSIS — I959 Hypotension, unspecified: Secondary | ICD-10-CM | POA: Diagnosis present

## 2021-11-04 DIAGNOSIS — J9601 Acute respiratory failure with hypoxia: Secondary | ICD-10-CM | POA: Diagnosis present

## 2021-11-04 DIAGNOSIS — R9431 Abnormal electrocardiogram [ECG] [EKG]: Secondary | ICD-10-CM

## 2021-11-04 DIAGNOSIS — J90 Pleural effusion, not elsewhere classified: Secondary | ICD-10-CM

## 2021-11-04 DIAGNOSIS — J449 Chronic obstructive pulmonary disease, unspecified: Secondary | ICD-10-CM | POA: Diagnosis present

## 2021-11-04 DIAGNOSIS — Z8679 Personal history of other diseases of the circulatory system: Secondary | ICD-10-CM

## 2021-11-04 DIAGNOSIS — E782 Mixed hyperlipidemia: Secondary | ICD-10-CM | POA: Diagnosis present

## 2021-11-04 DIAGNOSIS — G9341 Metabolic encephalopathy: Secondary | ICD-10-CM | POA: Diagnosis present

## 2021-11-04 DIAGNOSIS — I319 Disease of pericardium, unspecified: Secondary | ICD-10-CM

## 2021-11-04 DIAGNOSIS — E1122 Type 2 diabetes mellitus with diabetic chronic kidney disease: Secondary | ICD-10-CM | POA: Diagnosis present

## 2021-11-04 DIAGNOSIS — F0150 Vascular dementia without behavioral disturbance: Secondary | ICD-10-CM | POA: Diagnosis present

## 2021-11-04 DIAGNOSIS — I480 Paroxysmal atrial fibrillation: Secondary | ICD-10-CM | POA: Diagnosis present

## 2021-11-04 DIAGNOSIS — Z66 Do not resuscitate: Secondary | ICD-10-CM | POA: Diagnosis present

## 2021-11-04 DIAGNOSIS — F419 Anxiety disorder, unspecified: Secondary | ICD-10-CM | POA: Diagnosis present

## 2021-11-04 DIAGNOSIS — R410 Disorientation, unspecified: Secondary | ICD-10-CM

## 2021-11-04 DIAGNOSIS — F0280 Dementia in other diseases classified elsewhere without behavioral disturbance: Secondary | ICD-10-CM | POA: Diagnosis present

## 2021-11-04 LAB — CBC WITH DIFFERENTIAL/PLATELET
Abs Immature Granulocytes: 0.03 10*3/uL (ref 0.00–0.07)
Basophils Absolute: 0 10*3/uL (ref 0.0–0.1)
Basophils Relative: 0 %
Eosinophils Absolute: 0 10*3/uL (ref 0.0–0.5)
Eosinophils Relative: 0 %
HCT: 38.1 % (ref 36.0–46.0)
Hemoglobin: 12.6 g/dL (ref 12.0–15.0)
Immature Granulocytes: 0 %
Lymphocytes Relative: 21 %
Lymphs Abs: 1.6 10*3/uL (ref 0.7–4.0)
MCH: 31.5 pg (ref 26.0–34.0)
MCHC: 33.1 g/dL (ref 30.0–36.0)
MCV: 95.3 fL (ref 80.0–100.0)
Monocytes Absolute: 0.9 10*3/uL (ref 0.1–1.0)
Monocytes Relative: 12 %
Neutro Abs: 5 10*3/uL (ref 1.7–7.7)
Neutrophils Relative %: 67 %
Platelets: 290 10*3/uL (ref 150–400)
RBC: 4 MIL/uL (ref 3.87–5.11)
RDW: 15.5 % (ref 11.5–15.5)
WBC: 7.5 10*3/uL (ref 4.0–10.5)
nRBC: 0 % (ref 0.0–0.2)

## 2021-11-04 LAB — URINALYSIS, ROUTINE W REFLEX MICROSCOPIC
Bilirubin Urine: NEGATIVE
Glucose, UA: NEGATIVE mg/dL
Hgb urine dipstick: NEGATIVE
Ketones, ur: 5 mg/dL — AB
Nitrite: NEGATIVE
Protein, ur: 30 mg/dL — AB
Specific Gravity, Urine: 1.017 (ref 1.005–1.030)
pH: 5 (ref 5.0–8.0)

## 2021-11-04 LAB — I-STAT VENOUS BLOOD GAS, ED
Acid-Base Excess: 9 mmol/L — ABNORMAL HIGH (ref 0.0–2.0)
Bicarbonate: 34.8 mmol/L — ABNORMAL HIGH (ref 20.0–28.0)
Calcium, Ion: 1.33 mmol/L (ref 1.15–1.40)
HCT: 34 % — ABNORMAL LOW (ref 36.0–46.0)
Hemoglobin: 11.6 g/dL — ABNORMAL LOW (ref 12.0–15.0)
O2 Saturation: 39 %
Potassium: 4.5 mmol/L (ref 3.5–5.1)
Sodium: 139 mmol/L (ref 135–145)
TCO2: 36 mmol/L — ABNORMAL HIGH (ref 22–32)
pCO2, Ven: 51.5 mmHg (ref 44.0–60.0)
pH, Ven: 7.437 — ABNORMAL HIGH (ref 7.250–7.430)
pO2, Ven: 23 mmHg — CL (ref 32.0–45.0)

## 2021-11-04 LAB — COMPREHENSIVE METABOLIC PANEL
ALT: 25 U/L (ref 0–44)
AST: 42 U/L — ABNORMAL HIGH (ref 15–41)
Albumin: 2.4 g/dL — ABNORMAL LOW (ref 3.5–5.0)
Alkaline Phosphatase: 70 U/L (ref 38–126)
Anion gap: 9 (ref 5–15)
BUN: 63 mg/dL — ABNORMAL HIGH (ref 8–23)
CO2: 28 mmol/L (ref 22–32)
Calcium: 10.1 mg/dL (ref 8.9–10.3)
Chloride: 99 mmol/L (ref 98–111)
Creatinine, Ser: 1.25 mg/dL — ABNORMAL HIGH (ref 0.44–1.00)
GFR, Estimated: 47 mL/min — ABNORMAL LOW (ref >60)
Glucose, Bld: 164 mg/dL — ABNORMAL HIGH (ref 70–99)
Potassium: 4.6 mmol/L (ref 3.5–5.1)
Sodium: 136 mmol/L (ref 135–145)
Total Bilirubin: 0.7 mg/dL (ref 0.3–1.2)
Total Protein: 6.2 g/dL — ABNORMAL LOW (ref 6.5–8.1)

## 2021-11-04 LAB — VALPROIC ACID LEVEL: Valproic Acid Lvl: 67 ug/mL (ref 50.0–100.0)

## 2021-11-04 LAB — RESP PANEL BY RT-PCR (FLU A&B, COVID) ARPGX2
Influenza A by PCR: NEGATIVE
Influenza B by PCR: NEGATIVE
SARS Coronavirus 2 by RT PCR: NEGATIVE

## 2021-11-04 LAB — BRAIN NATRIURETIC PEPTIDE: B Natriuretic Peptide: 247.7 pg/mL — ABNORMAL HIGH (ref 0.0–100.0)

## 2021-11-04 LAB — OSMOLALITY: Osmolality: 315 mOsm/kg — ABNORMAL HIGH (ref 275–295)

## 2021-11-04 IMAGING — CT CT ABD-PELV W/ CM
4 of 8 series · 10 of 46 positions shown, 16 images · IV contrast (omnipaque)
Comparison: [DATE], [DATE]

CLINICAL DATA: Hypoxia, abdominal pain

EXAM:
CT ANGIOGRAPHY CHEST
CT ABDOMEN AND PELVIS WITH CONTRAST
TECHNIQUE: Multidetector CT imaging of the chest was performed using the
standard protocol during bolus administration of intravenous
contrast. Multiplanar CT image reconstructions and MIPs were
obtained to evaluate the vascular anatomy. Multidetector CT imaging
of the abdomen and pelvis was performed using the standard protocol
during bolus administration of intravenous contrast.
CONTRAST:  100mL OMNIPAQUE IOHEXOL 350 MG/ML SOLN

[Series 5: pe thins · axial · 0.88mm/px · z∈[-120,-35]mm · 3 of 299 slices shown]
[im 22/299  soft-tissue]
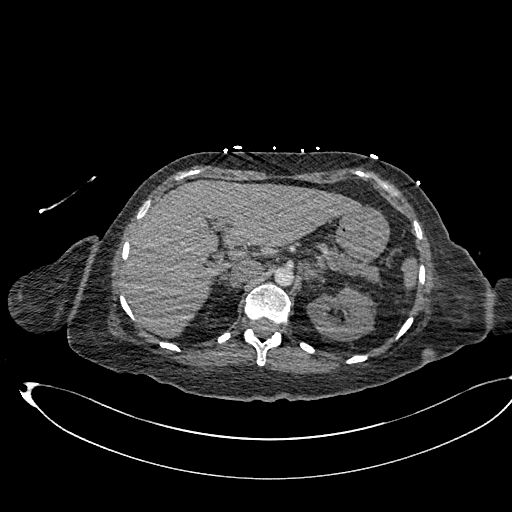
[im 64/299  soft-tissue]
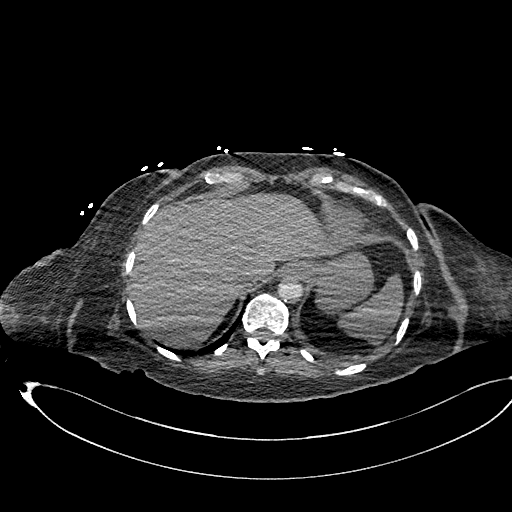
[im 107/299  soft-tissue]
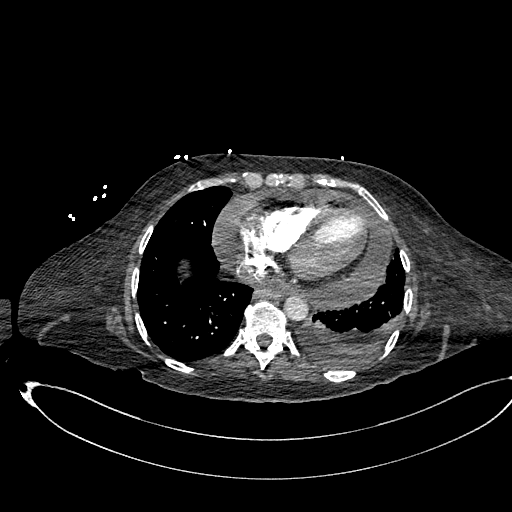

[Series 6: pe 2mm cor · coronal · 0.56mm/px · 3 of 147 slices shown, 4 images]
[im 37/147  soft-tissue]
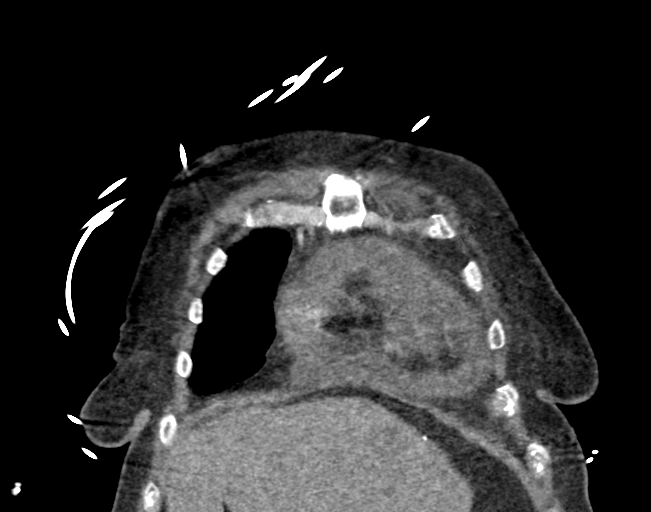
[im 74/147  soft-tissue]
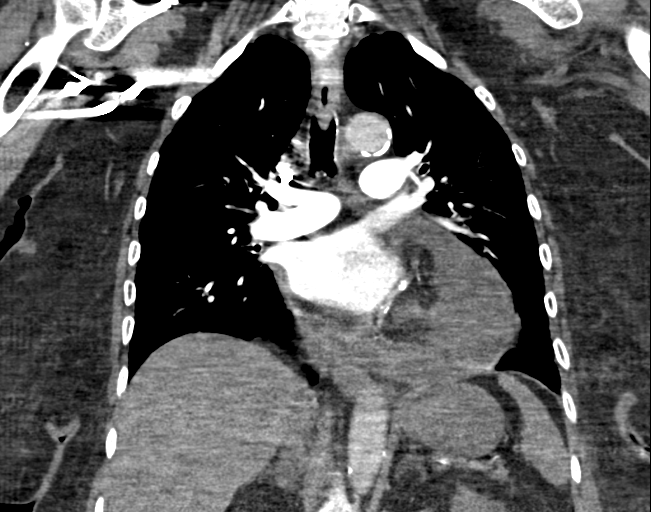
[im 74/147  bone]
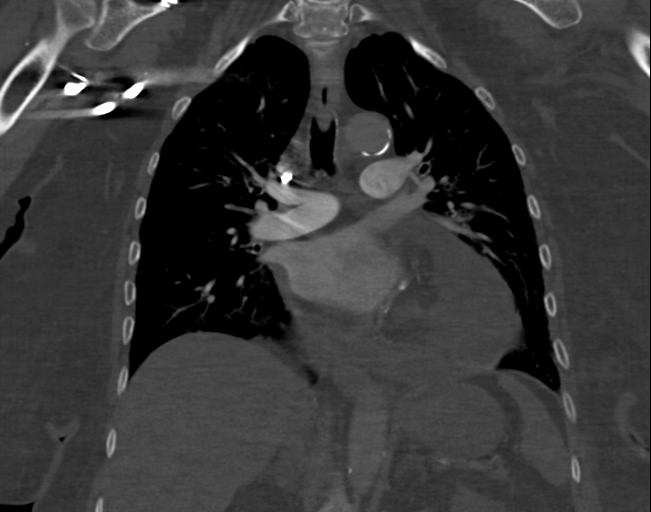
[im 110/147  soft-tissue]
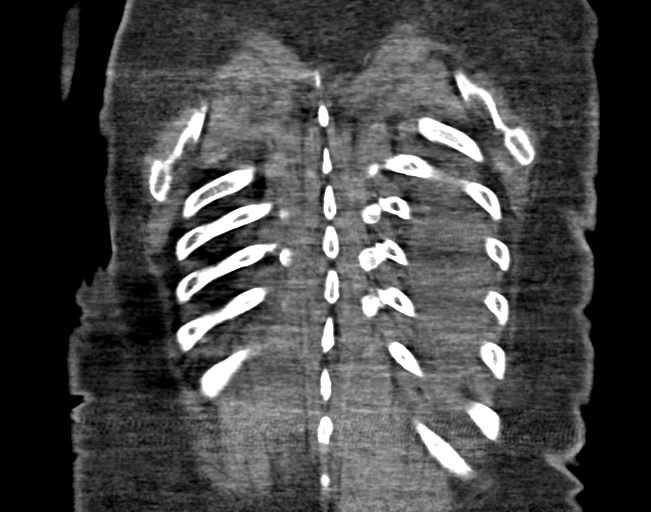

[Series 7: a/p w/ 5mm · axial · 0.98mm/px · z∈[-391,-126]mm · 3 of 107 slices shown, 7 images]
[im 27/107  soft-tissue]
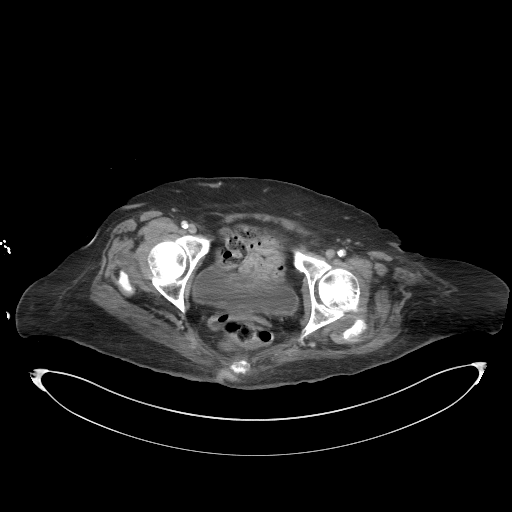
[im 27/107  lung]
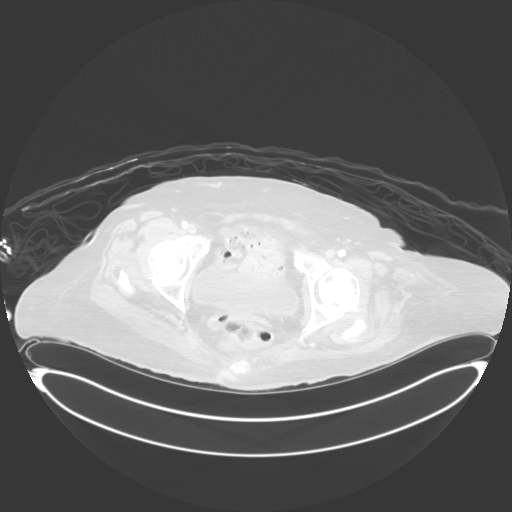
[im 27/107  bone]
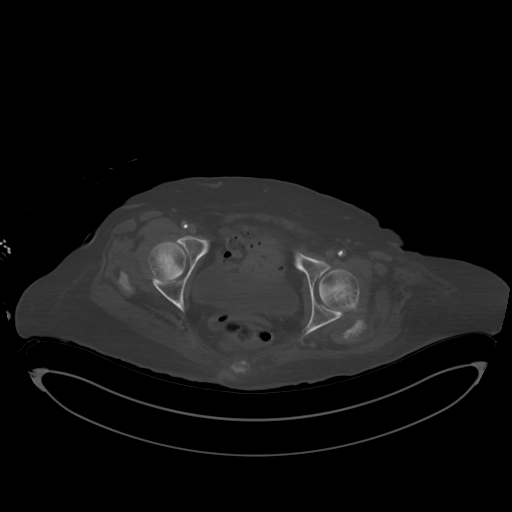
[im 54/107  soft-tissue]
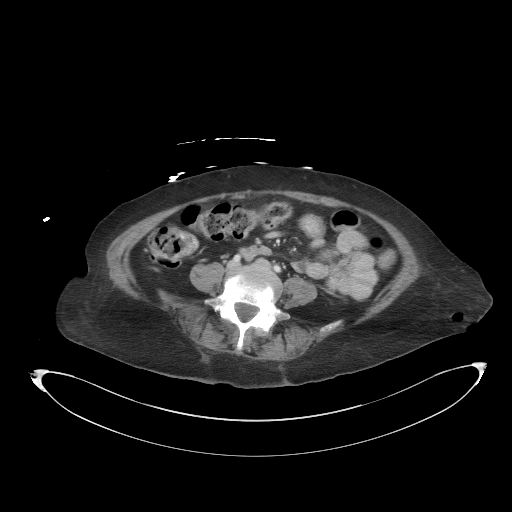
[im 54/107  lung]
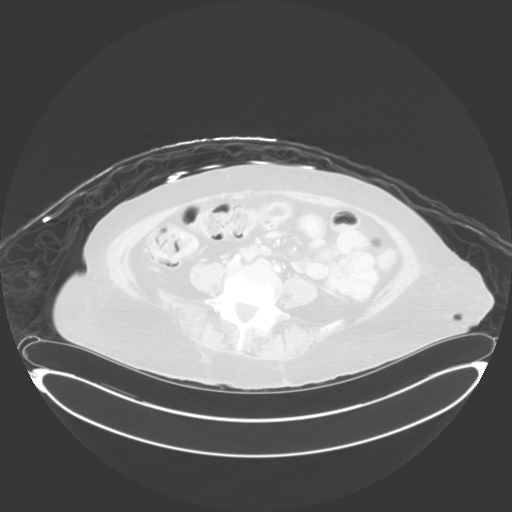
[im 80/107  soft-tissue]
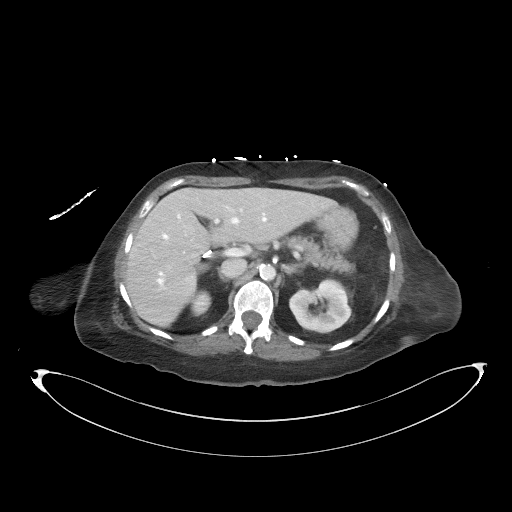
[im 80/107  lung]
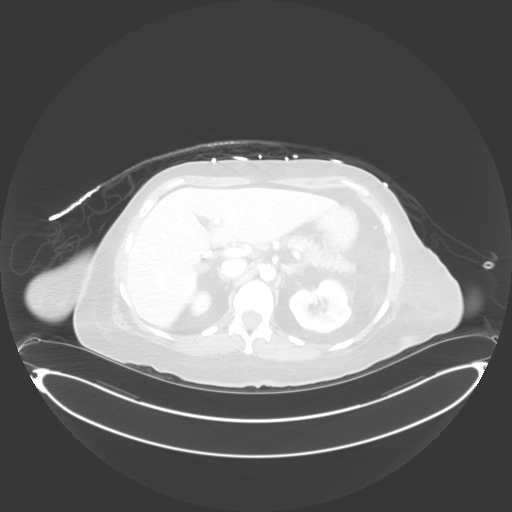

[Series 11: a/p w/ sag · sagittal · 1.00mm/px · 1 of 242 slices shown, 2 images]
[im 81/242  soft-tissue]
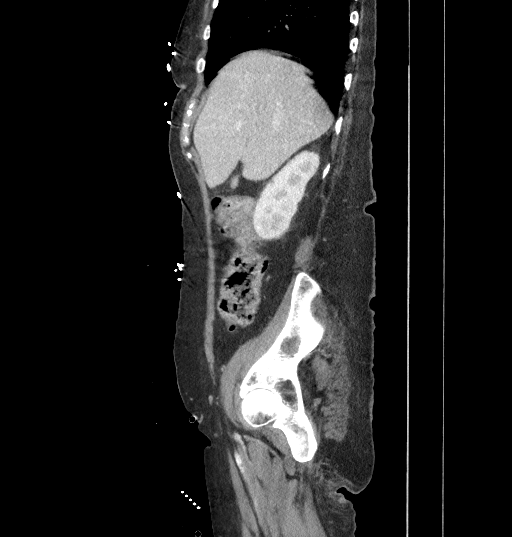
[im 81/242  bone]
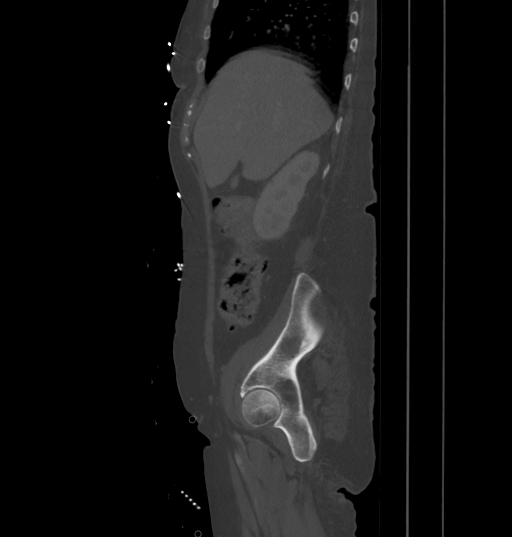

[10 of 46 positions shown; findings below may reference images not displayed]

FINDINGS: CTA CHEST FINDINGS

Cardiovascular: This is a technically adequate evaluation of the
pulmonary vasculature. No filling defects or pulmonary emboli.

There is a large pericardial effusion measuring up to 2.8 cm in
thickness. There is subtle pericardial enhancement, concerning for
exudative effusion.

No evidence of thoracic aortic aneurysm or dissection. Moderate
atherosclerosis of the aorta and coronary vasculature.

Mediastinum/Nodes: No enlarged mediastinal, hilar, or axillary lymph
nodes. Thyroid gland, trachea, and esophagus demonstrate no
significant findings.

Lungs/Pleura: Small left pleural effusion with left lower lobe
compressive atelectasis. Mild bilateral perihilar interstitial and
ground-glass opacities consistent with mild edema. No pneumothorax.
Central airways are patent.

Musculoskeletal: No acute or destructive bony lesions. Reconstructed
images demonstrate no additional findings.

Review of the MIP images confirms the above findings.

CT ABDOMEN and PELVIS FINDINGS

Hepatobiliary: Reflux of contrast into the hepatic veins suggests
cardiac dysfunction. No focal liver abnormality. Gallbladder is
surgically absent.

Pancreas: Unremarkable. No pancreatic ductal dilatation or
surrounding inflammatory changes.

Spleen: Normal in size without focal abnormality.

Adrenals/Urinary Tract: Adrenal glands are unremarkable. Kidneys are
normal, without renal calculi, focal lesion, or hydronephrosis.
Bladder is unremarkable.

Stomach/Bowel: No bowel obstruction or ileus. Normal appendix right
lower quadrant. Mild wall thickening involving the distal transverse
and descending colon is nonspecific given decompressed state, and
underlying colitis cannot be excluded. No associated inflammatory
change.

Vascular/Lymphatic: Significant atherosclerosis of the aorta, with
focal high-grade stenosis of the distal aorta just proximal to the
bifurcation. Please correlate with any symptoms of claudication.

No pathologic adenopathy within the abdomen or pelvis.

Reproductive: Uterus and bilateral adnexa are unremarkable.

Other: No free fluid or free gas.  No abdominal wall hernia.

Musculoskeletal: No acute or destructive bony lesions. Prominent
spondylosis at L5-S1. Reconstructed images demonstrate no additional
findings.

Review of the MIP images confirms the above findings.
IMPRESSION: 1. Large pericardial effusion, with pericardial enhancement
concerning for pericarditis and exudative effusion.
2. Mild perihilar edema, small left pleural effusion, and reflux of
contrast into the hepatic veins may reflect an element of
constrictive pericarditis. Correlation with echocardiography is
recommended.
3. No evidence of pulmonary embolus.
4. Nonspecific segmental distal colonic wall thickening. While this
could be due to decompressed state, mild colitis cannot be excluded.
5. Aortic Atherosclerosis ([JO]-[JO]). High-grade stenosis at the
aortic bifurcation from heavily calcified atheromatous plaque
unchanged. Please correlate with any symptoms of lower extremity
claudication.

## 2021-11-04 IMAGING — DX DG CHEST 1V
1 series · 1 of 1 positions shown · non-contrast
Comparison: [DATE]

CLINICAL DATA: Altered mental status.

EXAM:
CHEST  1 VIEW

[chest]
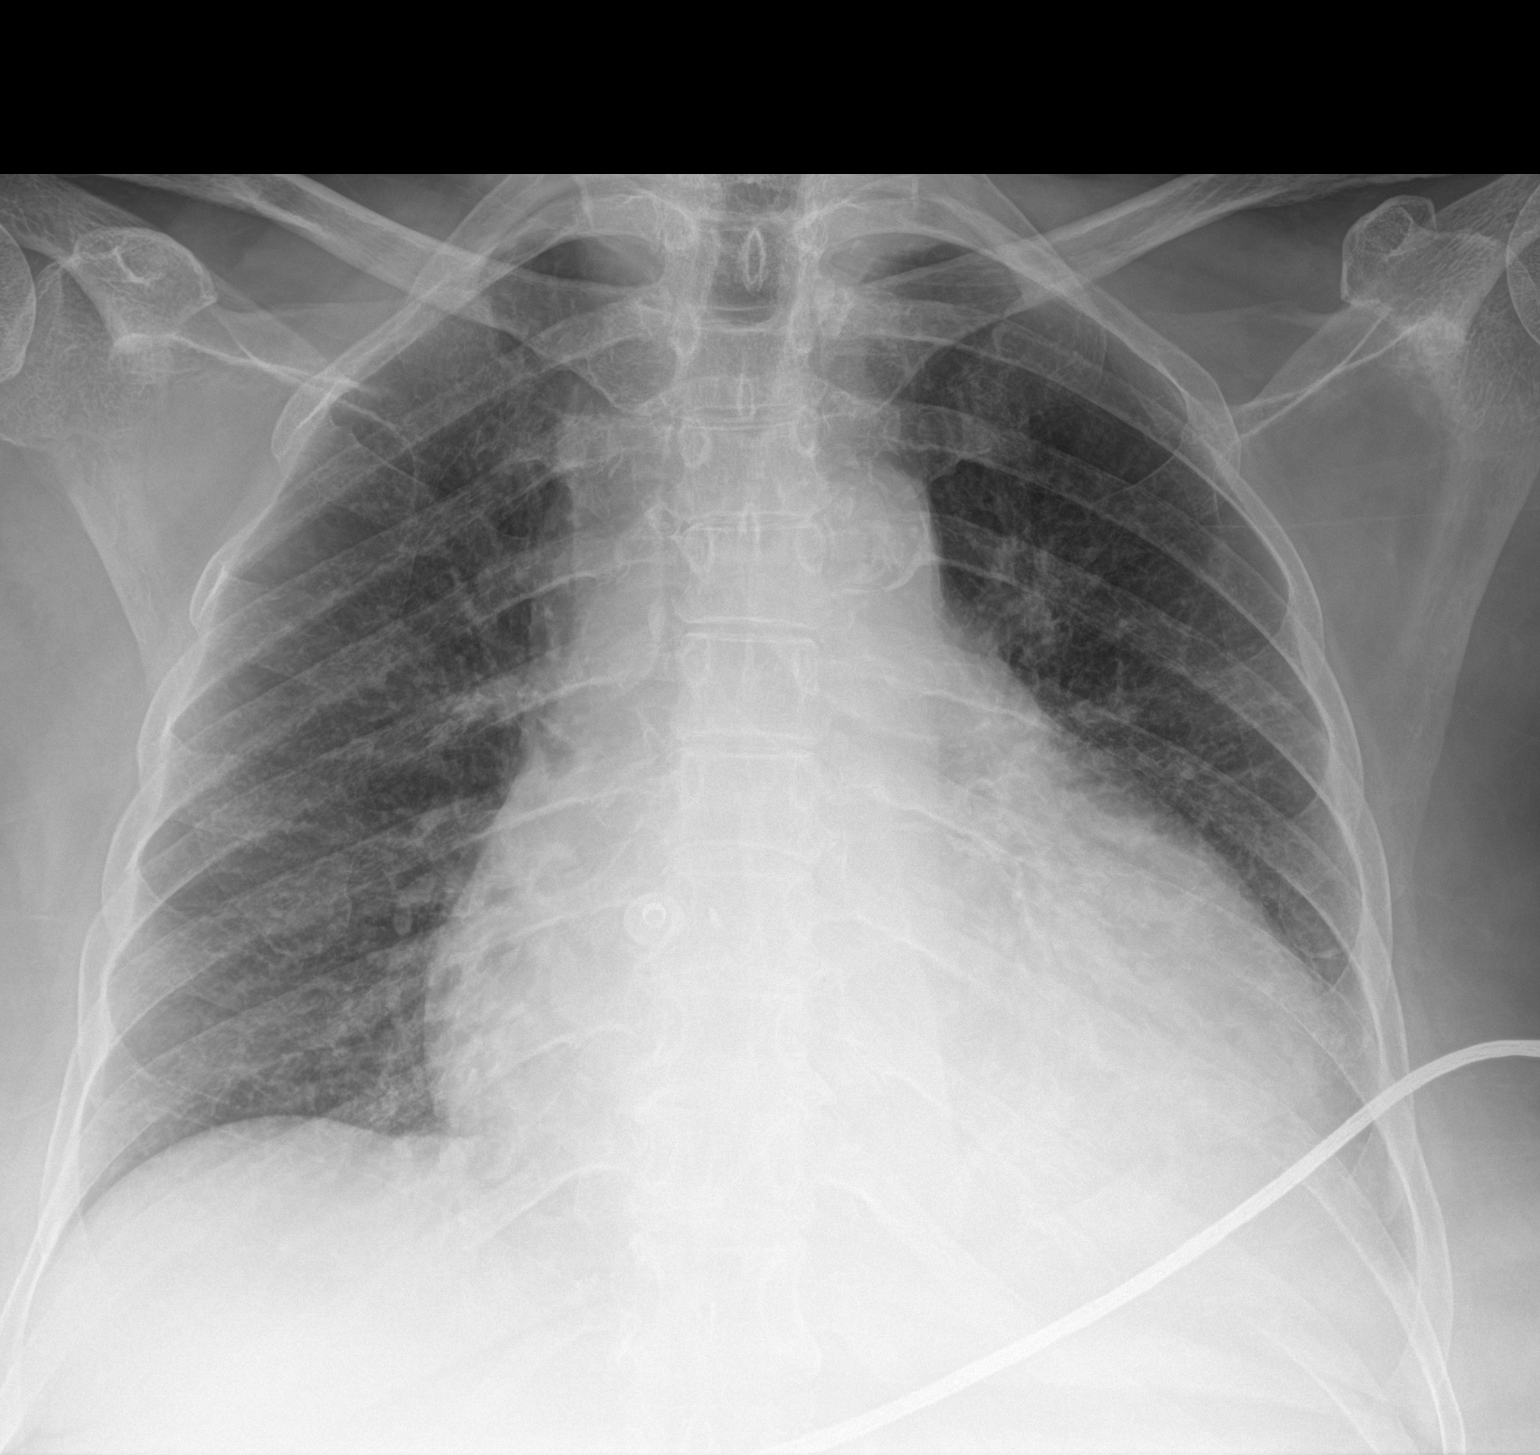

[1 of 1 positions shown; findings below may reference images not displayed]

FINDINGS: Mild to moderate severity infiltrate is seen within the left lung
base. There is no evidence of a pleural effusion or pneumothorax.
There is moderate to marked severity enlargement of the cardiac
silhouette. This is increased in size when compared to the prior
study, which may be technical in nature. Moderate to marked severity
calcification of the aortic arch is noted. The visualized skeletal
structures are unremarkable.
IMPRESSION: Moderate to marked severity cardiomegaly with mild to moderate
severity left basilar infiltrate.

## 2021-11-04 IMAGING — CT CT ANGIO CHEST
1 of 3 series · 8 of 32 positions shown · IV contrast (omnipaque)
Comparison: [DATE], [DATE]

CLINICAL DATA: Hypoxia, abdominal pain

EXAM:
CT ANGIOGRAPHY CHEST
CT ABDOMEN AND PELVIS WITH CONTRAST
TECHNIQUE: Multidetector CT imaging of the chest was performed using the
standard protocol during bolus administration of intravenous
contrast. Multiplanar CT image reconstructions and MIPs were
obtained to evaluate the vascular anatomy. Multidetector CT imaging
of the abdomen and pelvis was performed using the standard protocol
during bolus administration of intravenous contrast.
CONTRAST:  100mL OMNIPAQUE IOHEXOL 350 MG/ML SOLN

[Series 5: pe 2mm sag · sagittal · 0.59mm/px · 8 of 180 slices shown]
[im 20/180  lung]
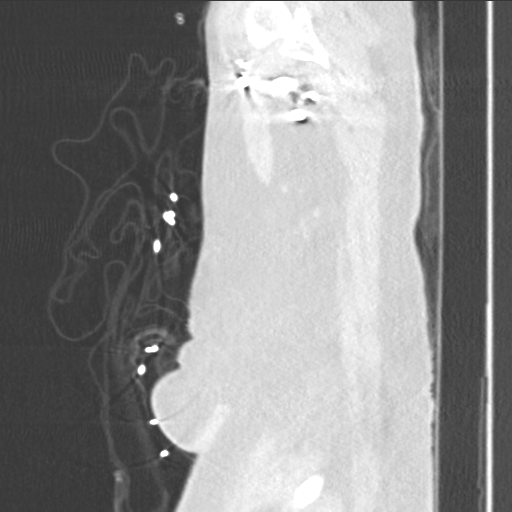
[im 40/180  mediastinal]
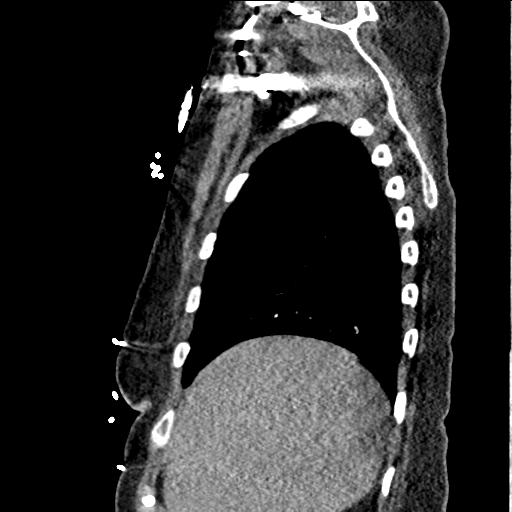
[im 60/180  lung]
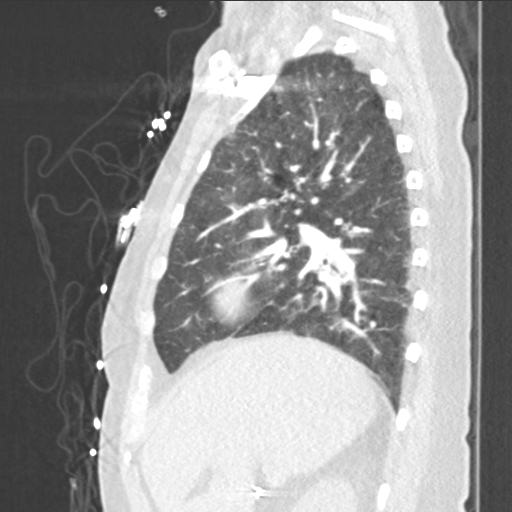
[im 80/180  mediastinal]
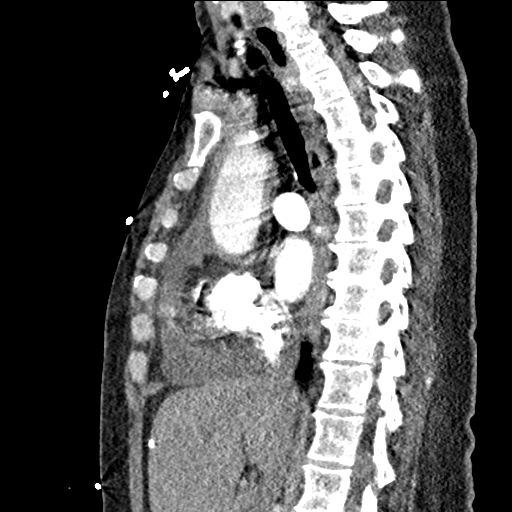
[im 100/180  lung]
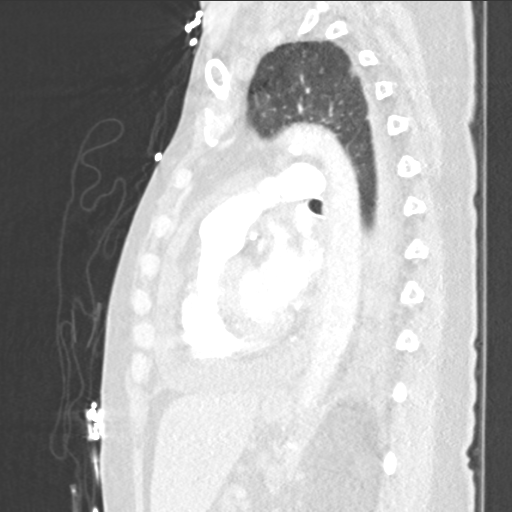
[im 120/180  mediastinal]
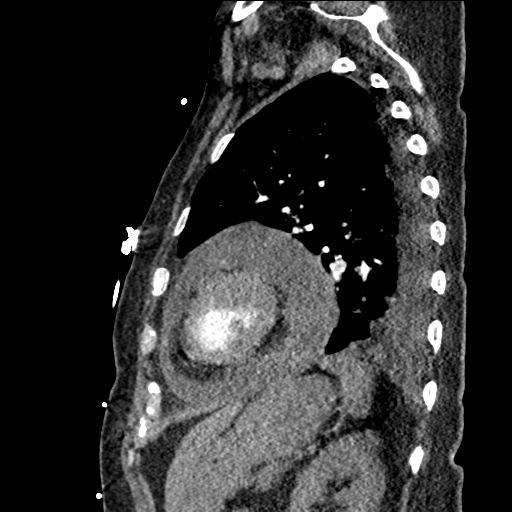
[im 140/180  lung]
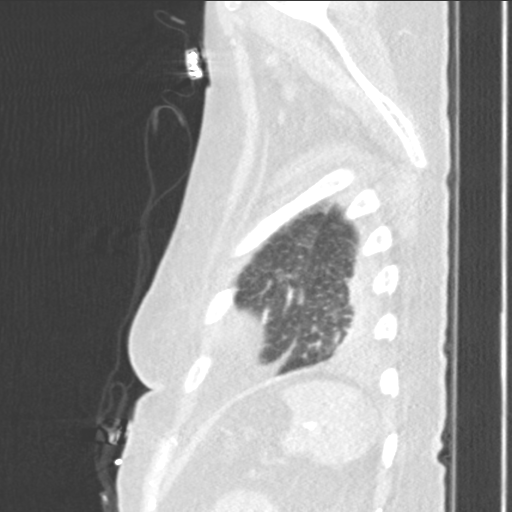
[im 160/180  mediastinal]
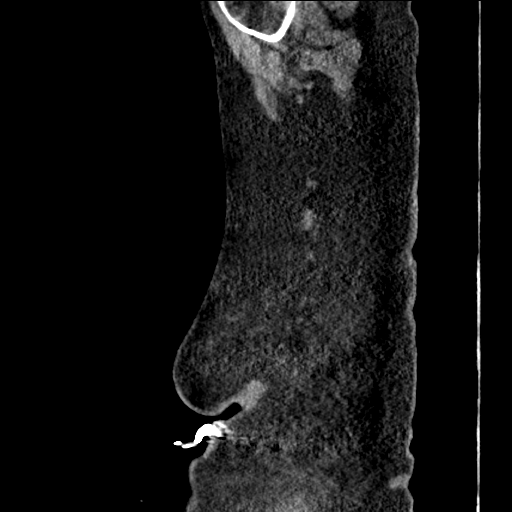

[8 of 32 positions shown; findings below may reference images not displayed]

FINDINGS: CTA CHEST FINDINGS

Cardiovascular: This is a technically adequate evaluation of the
pulmonary vasculature. No filling defects or pulmonary emboli.

There is a large pericardial effusion measuring up to 2.8 cm in
thickness. There is subtle pericardial enhancement, concerning for
exudative effusion.

No evidence of thoracic aortic aneurysm or dissection. Moderate
atherosclerosis of the aorta and coronary vasculature.

Mediastinum/Nodes: No enlarged mediastinal, hilar, or axillary lymph
nodes. Thyroid gland, trachea, and esophagus demonstrate no
significant findings.

Lungs/Pleura: Small left pleural effusion with left lower lobe
compressive atelectasis. Mild bilateral perihilar interstitial and
ground-glass opacities consistent with mild edema. No pneumothorax.
Central airways are patent.

Musculoskeletal: No acute or destructive bony lesions. Reconstructed
images demonstrate no additional findings.

Review of the MIP images confirms the above findings.

CT ABDOMEN and PELVIS FINDINGS

Hepatobiliary: Reflux of contrast into the hepatic veins suggests
cardiac dysfunction. No focal liver abnormality. Gallbladder is
surgically absent.

Pancreas: Unremarkable. No pancreatic ductal dilatation or
surrounding inflammatory changes.

Spleen: Normal in size without focal abnormality.

Adrenals/Urinary Tract: Adrenal glands are unremarkable. Kidneys are
normal, without renal calculi, focal lesion, or hydronephrosis.
Bladder is unremarkable.

Stomach/Bowel: No bowel obstruction or ileus. Normal appendix right
lower quadrant. Mild wall thickening involving the distal transverse
and descending colon is nonspecific given decompressed state, and
underlying colitis cannot be excluded. No associated inflammatory
change.

Vascular/Lymphatic: Significant atherosclerosis of the aorta, with
focal high-grade stenosis of the distal aorta just proximal to the
bifurcation. Please correlate with any symptoms of claudication.

No pathologic adenopathy within the abdomen or pelvis.

Reproductive: Uterus and bilateral adnexa are unremarkable.

Other: No free fluid or free gas.  No abdominal wall hernia.

Musculoskeletal: No acute or destructive bony lesions. Prominent
spondylosis at L5-S1. Reconstructed images demonstrate no additional
findings.

Review of the MIP images confirms the above findings.
IMPRESSION: 1. Large pericardial effusion, with pericardial enhancement
concerning for pericarditis and exudative effusion.
2. Mild perihilar edema, small left pleural effusion, and reflux of
contrast into the hepatic veins may reflect an element of
constrictive pericarditis. Correlation with echocardiography is
recommended.
3. No evidence of pulmonary embolus.
4. Nonspecific segmental distal colonic wall thickening. While this
could be due to decompressed state, mild colitis cannot be excluded.
5. Aortic Atherosclerosis ([JO]-[JO]). High-grade stenosis at the
aortic bifurcation from heavily calcified atheromatous plaque
unchanged. Please correlate with any symptoms of lower extremity
claudication.

## 2021-11-04 IMAGING — CT CT HEAD W/O CM
4 series · 17 of 47 positions shown, 19 images · non-contrast
Comparison: [DATE], [DATE]

CLINICAL DATA: Altered level of consciousness for 2 days

EXAM:
CT HEAD WITHOUT CONTRAST
TECHNIQUE: Contiguous axial images were obtained from the base of the skull
through the vertex without intravenous contrast.

[Series 3: head without · axial · non-contrast · 0.39mm/px · z∈[-148,-28]mm · 7 of 34 slices shown, 9 images]
[im 5/34  brain]
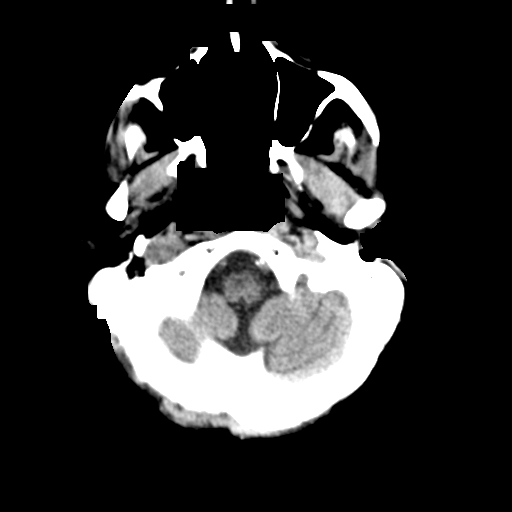
[im 5/34  bone]
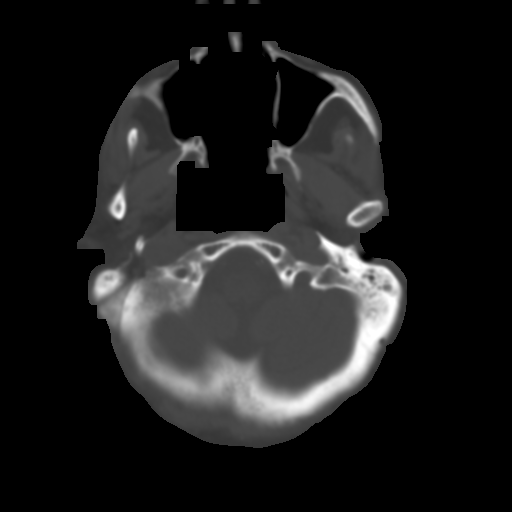
[im 9/34  brain]
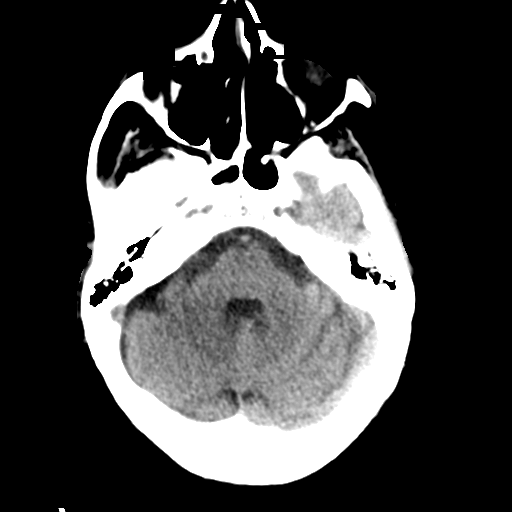
[im 13/34  brain]
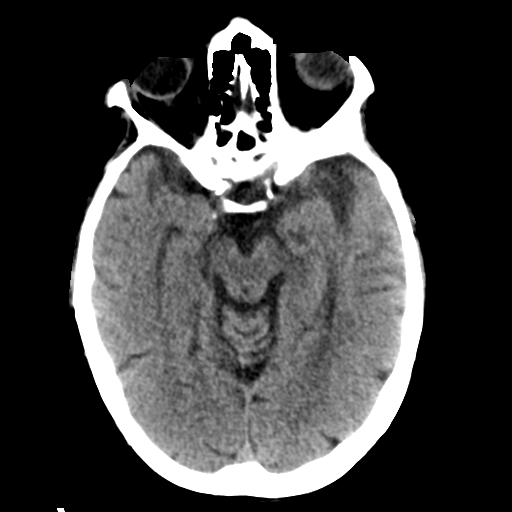
[im 17/34  brain]
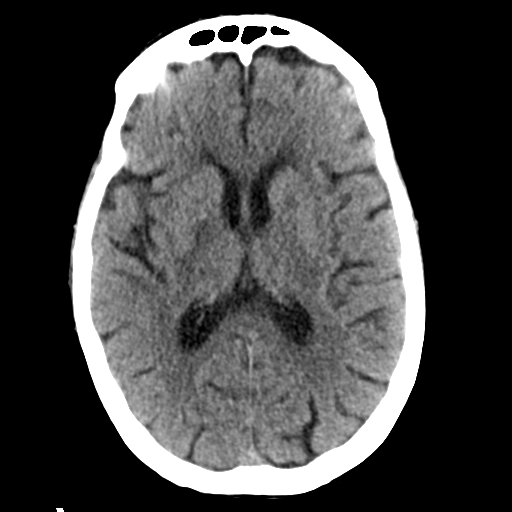
[im 21/34  brain]
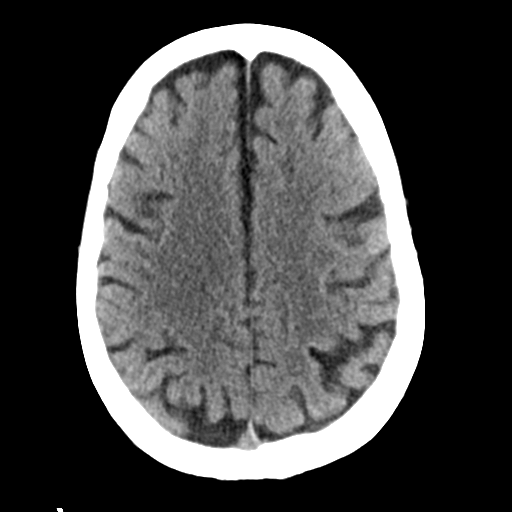
[im 21/34  bone]
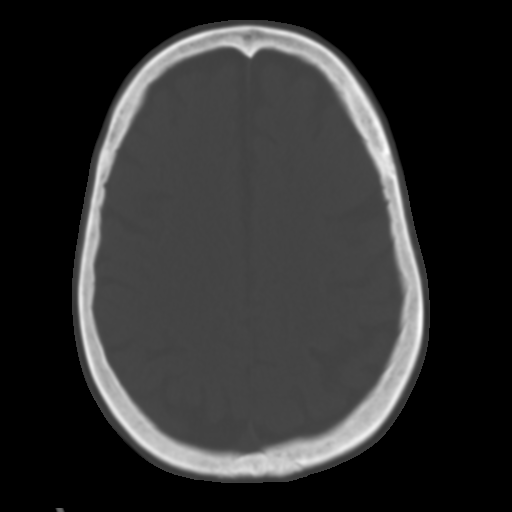
[im 25/34  brain]
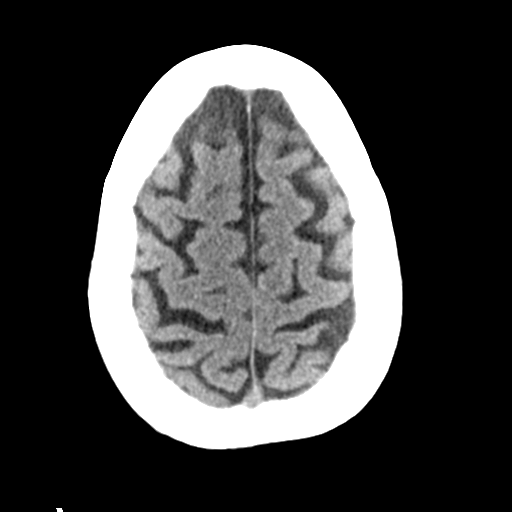
[im 29/34  brain]
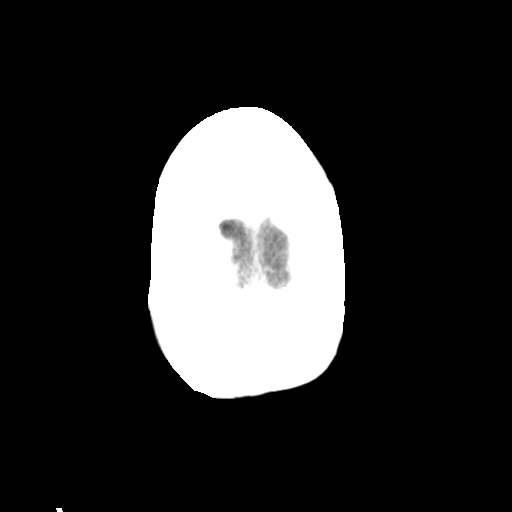

[Series 4: head bone · axial · 0.39mm/px · z∈[-152,-96]mm · 4 of 83 slices shown]
[im 9/83  bone]
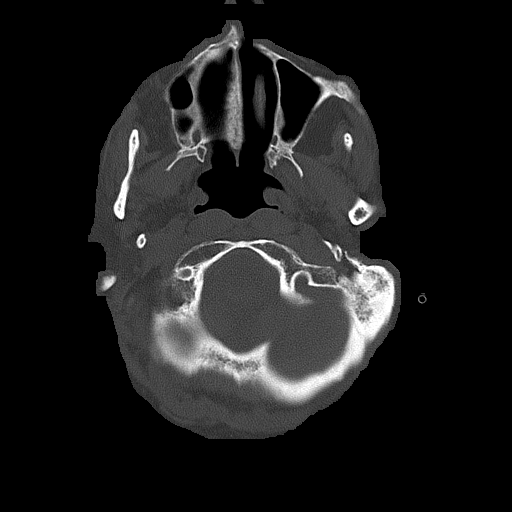
[im 17/83  bone]
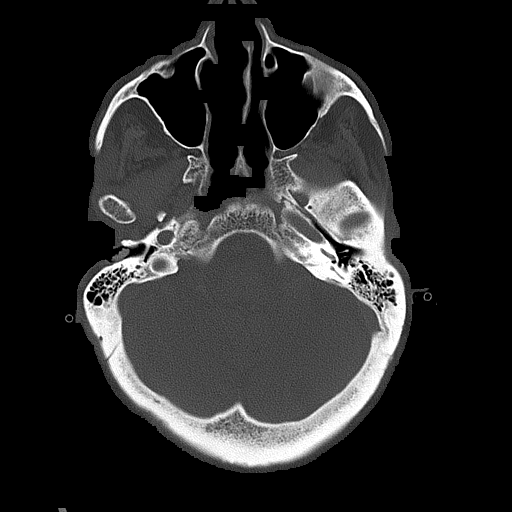
[im 25/83  bone]
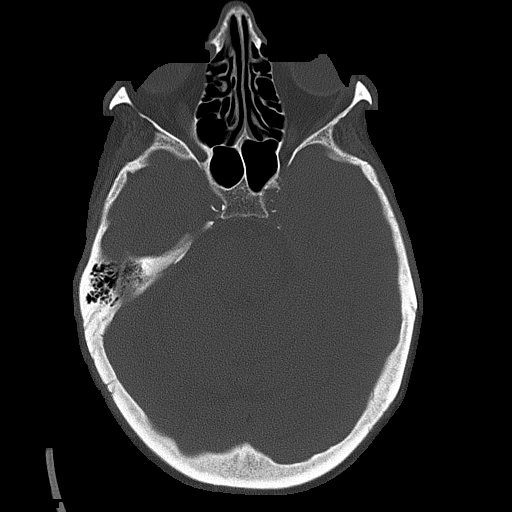
[im 37/83  bone]
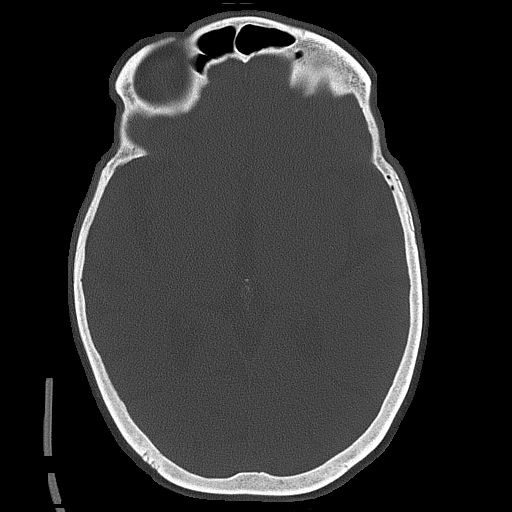

[Series 5: head without cor · coronal · non-contrast · 0.31mm/px · 3 of 68 slices shown]
[im 23/68  brain]
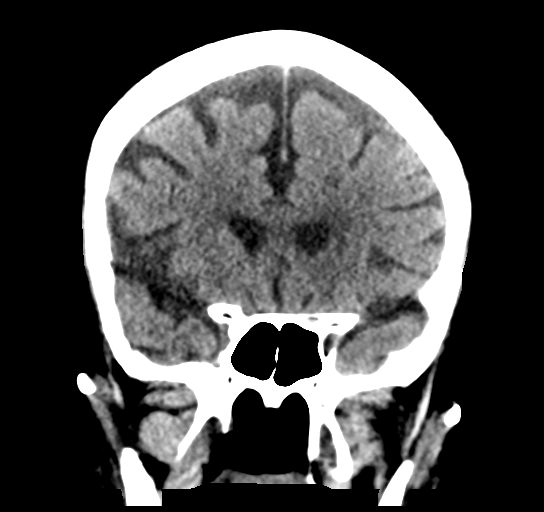
[im 30/68  brain]
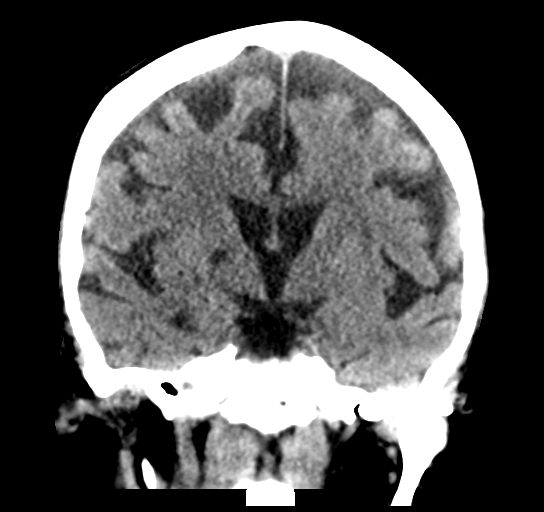
[im 38/68  brain]
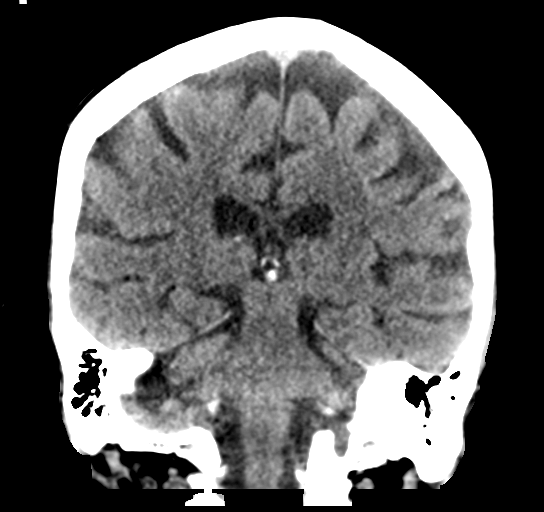

[Series 6: head without sag · sagittal · non-contrast · 0.32mm/px · 3 of 53 slices shown]
[im 18/53  brain]
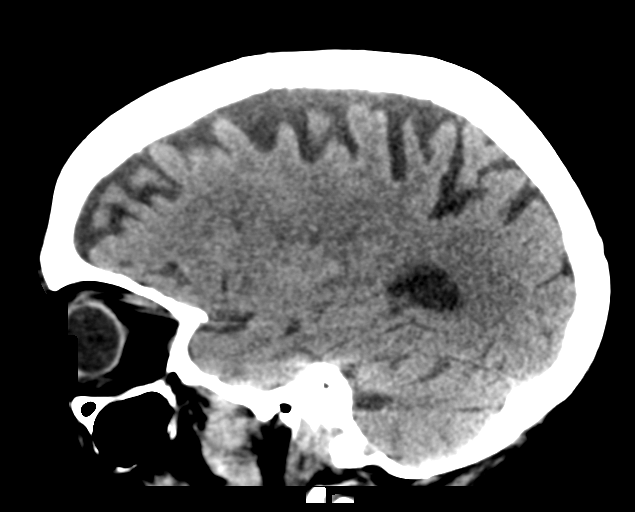
[im 27/53  brain]
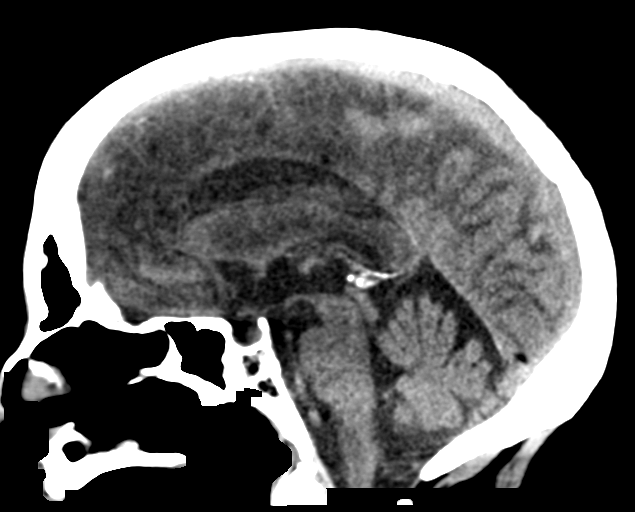
[im 35/53  brain]
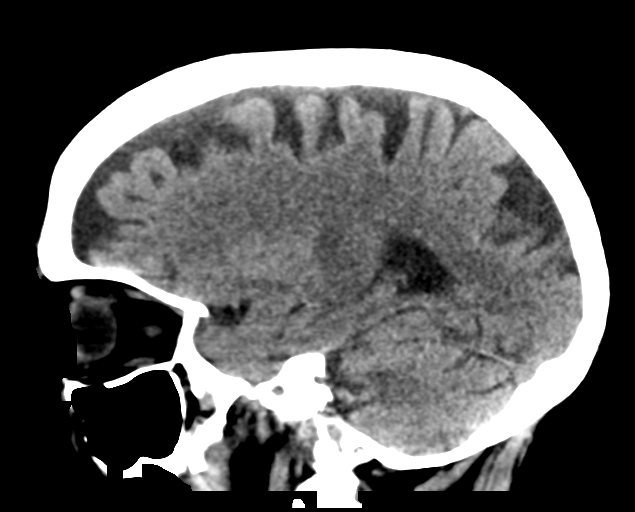

[17 of 47 positions shown; findings below may reference images not displayed]

FINDINGS: Brain: Chronic ischemic changes are seen within the bilateral basal
ganglia, right greater than left. No evidence of acute infarct or
hemorrhage. Lateral ventricles and remaining midline structures are
unremarkable. No acute extra-axial fluid collections. No mass
effect.

Vascular: No hyperdense vessel or unexpected calcification.

Skull: Normal. Negative for fracture or focal lesion.

Sinuses/Orbits: No acute finding.

Other: None.
IMPRESSION: 1. Chronic small vessel ischemic changes within the basal ganglia.
No acute intracranial process.

## 2021-11-04 MED ORDER — IOHEXOL 350 MG/ML SOLN
100.0000 mL | Freq: Once | INTRAVENOUS | Status: AC | PRN
  Administered 2021-11-04: 23:00:00 100 mL via INTRAVENOUS

## 2021-11-04 MED ORDER — SODIUM CHLORIDE 0.9 % IV SOLN
1.0000 g | Freq: Once | INTRAVENOUS | Status: AC
  Administered 2021-11-05: 02:00:00 1 g via INTRAVENOUS
  Filled 2021-11-04: qty 10

## 2021-11-04 MED ORDER — SODIUM CHLORIDE 0.9 % IV BOLUS
1000.0000 mL | Freq: Once | INTRAVENOUS | Status: AC
  Administered 2021-11-04: 20:00:00 1000 mL via INTRAVENOUS

## 2021-11-04 NOTE — ED Provider Notes (Signed)
[facility] Provider Note   CSN: [id number] Arrival date & time: 11/04/21  1858     History No chief complaint on file.   Claudia Keller is a 70 y.o. female hx of COPD, HTN, here presenting with abnormal labs and hypoxia.  Patient is from [facility].  Patient was noted to be more altered than usual.  Labs were drawn and patient was noted to have a sodium of 146.  Patient was also noted to be hypoxic 87% per EMS.  Patient demented at baseline.  Upon chart review, patient had a stroke earlier this year with left-sided deficits.   The history is provided by the EMS personnel and the nursing home.      Past Medical History:  Diagnosis Date   Arthritis    COPD (chronic obstructive pulmonary disease) ([place])    Diverticulosis    Essential hypertension, benign    Hx of colonic polyps    Hypothyroidism    IBS (irritable bowel syndrome)    Irritable bowel syndrome    Ketoacidosis    Mixed hyperlipidemia    PTSD (post-traumatic stress disorder)    S/P colonoscopy Jan 2011   RMR: pancolonic diverticula, polypoid rectal mucosa with  prominent lymphoid aggregates, repeat in Jan 2016    Shortness of breath dyspnea    Stress incontinence, female    Thyroid disease    Tobacco use    Type 2 diabetes mellitus ([place])     Patient Active Problem List   Diagnosis Date Noted   B12 deficiency 09/04/2021   CVA (cerebral vascular accident) ([place]) 09/02/2021   Pneumonia due to COVID-19 virus 12/07/2020   Leukocytosis 12/07/2020   Hyperglycemia due to diabetes mellitus ([place]) 12/07/2020   Hypoalbuminemia 12/07/2020   Elevated AST (SGOT) 12/07/2020   Obesity (BMI 30-39.9) 12/07/2020   COPD (chronic obstructive pulmonary disease) ([place]) 12/07/2020   GERD (gastroesophageal reflux disease) 12/07/2020   Diabetic neuropathy ([place]) 12/07/2020   Depression 12/07/2020   Anxiety 12/07/2020   Acute respiratory disease due to COVID-19 virus 12/07/2020    Acute respiratory failure with hypoxia ([place]) 12/01/2020   Segmental colitis without complication (HCC)    Class 2 obesity due to excess calories with body mass index (BMI) of 37.0 to 37.9 in adult    Ischemic colitis ([place]) 03/19/2020   Noncompliance with medication regimen 03/18/2020   Acute renal failure superimposed on stage 3a chronic kidney disease ([place]) 03/18/2020   DKA (diabetic ketoacidoses) 03/17/2020   Bipolar II disorder ([place]) 04/17/2018   PTSD (post-traumatic stress disorder) 04/17/2018   Personal history of noncompliance with medical treatment, presenting hazards to health 08/17/2016   Emesis 05/20/2016   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 ([place]) 09/08/2015   Essential hypertension, benign 09/08/2015   Primary hypothyroidism 09/08/2015   Diverticulosis of colon without hemorrhage    Nausea vomiting and diarrhea 10/27/2011   Chest pain 06/24/2011   Hypercholesterolemia 06/24/2011   Tobacco abuse 06/24/2011   IRRITABLE BOWEL SYNDROME 11/12/2009   Diarrhea 11/12/2009   OTHER SYMPTOMS INVOLVING DIGESTIVE SYSTEM OTHER 11/12/2009   History of colonic polyps 11/12/2009   Closed fracture of metatarsal bone 11/30/2007   HIGH BLOOD PRESSURE 11/30/2007    Past Surgical History:  Procedure Laterality Date   Bilateral tubal ligation     BREAST BIOPSY Left 10/16/2014   negative   CHOLECYSTECTOMY  09/11/2012   Procedure: LAPAROSCOPIC CHOLECYSTECTOMY;  Surgeon: Jamesetta So, MD;  Location: [facility];  Service: General;  Laterality: N/A;   COLONOSCOPY  11/17/2009   Dr. Gala Romney: normal rectum, pancolonic diverticula, polyps benign, no adenomas.    COLONOSCOPY N/A 02/12/2015   Dr. Gala Romney: colonic diverticulosis, multiple colonic polyps (tubular adenomas), negative segmental biopsies. Surveillance in 2021   ESOPHAGOGASTRODUODENOSCOPY  11/23/2011   Dr. Gala Romney: Erosive reflux esophagitis; small hiatal hernia; esophagus dilated empirically   MALONEY DILATION  11/23/2011   Procedure: Venia Minks  DILATION;  Surgeon: Daneil Dolin, MD;  Location: [facility];  Service: Endoscopy;  Laterality: N/A;   SKIN LESION EXCISION     on buttocks   TUBAL LIGATION       OB History     Gravida  4   Para  4   Term  4   Preterm      AB      Living  4      SAB      IAB      Ectopic      Multiple      Live Births              Family History  Problem Relation Age of Onset   Cancer Mother        unsure what type   Heart disease Father    Heart attack Father    Schizophrenia Brother    Stroke Brother    Cancer Son        bladder   Coronary artery disease Other    Diabetes type II Other    Colon cancer Neg Hx    Breast cancer Neg Hx     Social History   Tobacco Use   Smoking status: Every Day    Packs/day: 2.00    Years: 43.00    Pack years: 86.00    Types: Cigarettes   Smokeless tobacco: Never  Substance Use Topics   Alcohol use: No    Alcohol/week: 0.0 standard drinks   Drug use: No    Home Medications Prior to Admission medications   Medication Sig Start Date End Date Taking? Authorizing Provider  albuterol (VENTOLIN HFA) 108 (90 Base) MCG/ACT inhaler Inhale 2 puffs into the lungs every 4 (four) hours as needed for wheezing or shortness of breath. 12/03/20   Manuella Ghazi, Pratik D, DO  apixaban (ELIQUIS) 5 MG TABS tablet Take 1 tablet (5 mg total) by mouth 2 (two) times daily. 09/04/21   Johnson, Clanford L, MD  atorvastatin (LIPITOR) 80 MG tablet Take 1 tablet (80 mg total) by mouth at bedtime. 09/04/21   Johnson, Clanford L, MD  Calcium Carb-Cholecalciferol (OYSTER SHELL CALCIUM W/D) 500-200 MG-UNIT TABS Take 1 tablet by mouth 2 (two) times daily. 11/10/20   [provider]  cetirizine (ZYRTEC) 10 MG tablet Take 10 mg by mouth daily. 11/10/20   [provider]  colestipol (COLESTID) 1 g tablet TAKE 2 TABLETS DAILY FOR DIARRHEA. DO NOT TAKE WITHIN 2 HOURS OF OTHER ORAL MEDICATIONS. HOLD FOR CONSTIPATION. 05/23/20   Carlis Stable, NP  Continuous  Blood Gluc Sensor (FREESTYLE LIBRE 2 SENSOR) MISC 1 Device by Does not apply route every 14 (fourteen) days. 08/27/21   Renato Shin, MD  cyanocobalamin ([employer] VITAMIN B12) 2000 MCG tablet Take 1 tablet (2,000 mcg total) by mouth daily. 09/04/21   Johnson, Clanford L, MD  diltiazem (CARDIZEM CD) 120 MG 24 hr capsule Take 1 capsule (120 mg total) by mouth daily. 12/12/20 12/12/21  Kathie Dike, MD  divalproex (DEPAKOTE ER) 500 MG 24  hr tablet Take 2 tablets (1,000 mg total) by mouth every evening. 02/20/21   Norman Clay, MD  divalproex (DEPAKOTE) 250 MG DR tablet Take 1 tablet (250 mg total) by mouth daily. 02/20/21   Norman Clay, MD  furosemide (LASIX) 20 MG tablet Take 1 tablet (20 mg total) by mouth every other day. 09/07/21   Johnson, Clanford L, MD  gabapentin (NEURONTIN) 100 MG capsule Take 100 mg by mouth 3 (three) times daily.    [provider]  glucose blood (QUINTET BLOOD GLUCOSE TEST) test strip Check blood sugar 2 times daily E11.65 08/12/21   Renato Shin, MD  insulin detemir (LEVEMIR) 100 UNIT/ML injection Inject 0.3 mLs (30 Units total) into the skin every morning. 09/15/21   Renato Shin, MD  levothyroxine (SYNTHROID) 100 MCG tablet Take 2 tablets (200 mcg total) by mouth daily before breakfast. 12/12/20   Kathie Dike, MD  lisinopril (PRINIVIL,ZESTRIL) 20 MG tablet Take 20 mg by mouth daily.    [provider]  mirtazapine (REMERON) 7.5 MG tablet Take 1 tablet (7.5 mg total) by mouth at bedtime. 02/20/21   Norman Clay, MD  ondansetron (ZOFRAN) 4 MG tablet Take 1 tablet (4 mg total) by mouth every 8 (eight) hours as needed for nausea or vomiting. 10/17/18   Carlis Stable, NP  pantoprazole (PROTONIX) 40 MG tablet TAKE 1 TABLET BY MOUTH ONCE DAILY 30 MINTUES BEFORE BREAKFAST. Patient taking differently: Take 40 mg by mouth daily before breakfast. 05/23/20   Carlis Stable, NP  PARoxetine (PAXIL) 40 MG tablet Take 1 tablet (40 mg total) by mouth daily. 02/20/21   Norman Clay, MD  QUEtiapine (SEROQUEL) 100 MG tablet Take 1 tablet (100 mg total) by mouth at bedtime. 02/20/21   Norman Clay, MD  senna-docusate (SENOKOT-S) 8.6-50 MG tablet Take 1 tablet by mouth at bedtime as needed for mild constipation. 09/04/21   Murlean Iba, MD    Allergies    Patient has no known allergies.  Review of Systems   Review of Systems  Neurological:  Positive for weakness.  Psychiatric/Behavioral:  Positive for confusion.   All other systems reviewed and are negative.  Physical Exam Updated Vital Signs There were no vitals taken for this visit.  Physical Exam Vitals and nursing note reviewed.  Constitutional:      Comments: Confused  HENT:     Head: Normocephalic.     Mouth/Throat:     Mouth: Mucous membranes are dry.  Eyes:     Extraocular Movements: Extraocular movements intact.     Pupils: Pupils are equal, round, and reactive to light.  Cardiovascular:     Rate and Rhythm: Normal rate and regular rhythm.     Pulses: Normal pulses.     Heart sounds: Normal heart sounds.  Pulmonary:     Effort: Pulmonary effort is normal.     Breath sounds: Normal breath sounds.  Abdominal:     General: Abdomen is flat.     Palpations: Abdomen is soft.  Musculoskeletal:        General: Normal range of motion.     Cervical back: Normal range of motion and neck supple.  Skin:    General: Skin is warm.     Capillary Refill: Capillary refill takes less than 2 seconds.  Neurological:     Comments: ANO x1 patient has left-sided weakness which is chronic.  Patient's strength is 4 5 on the left side and 5 out of 5 on the right side.  No obvious facial droop.  No obvious slurred speech  Psychiatric:        Mood and Affect: Mood normal.        Behavior: Behavior normal.    ED Results / Procedures / Treatments   Labs (all labs ordered are listed, but only abnormal results are displayed) Labs Reviewed  URINE CULTURE  RESP PANEL BY RT-PCR (FLU A&B, COVID) [id number]   CBC WITH DIFFERENTIAL/PLATELET  COMPREHENSIVE METABOLIC PANEL  VALPROIC ACID LEVEL  URINALYSIS, ROUTINE W REFLEX MICROSCOPIC  OSMOLALITY  BLOOD GAS, VENOUS    EKG None  Radiology No results found.  Procedures Procedures   Medications Ordered in ED Medications  sodium chloride 0.9 % bolus 1,000 mL (has no administration in time range)    ED Course  I have reviewed the triage vital signs and the nursing notes.  Pertinent labs & imaging results that were available during my care of the patient were reviewed by me and considered in my medical decision making (see chart for details).  Clinical Course as of 11/05/21 2339  Thu Nov 05, 2021  0012 MCHC: 33.1 [JL]    Clinical Course User Index [JL] Long, Wonda Olds, MD   MDM Rules/Calculators/A&P                         VANDA WASKEY is a 71 y.o. female here presenting with worsening weakness and hypoxia.  Patient has chronic left-sided weakness and now is more altered.  Patient was noted to be hypernatremic likely from poor p.o. intake.  Patient is hypoxic 86%.  Consider COVID versus pneumonia versus electrolyte abnormalities versus UTI.  Plan to get CBC and CMP and serum osm and chest x-ray and urinalysis and CT head.  Plan to hydrate patient and reassess  10:59 PM Patient was initially doing well and desat to 85% on room air.  Patient was put on 2 L.  Patient's COVID test is negative and chest x-ray showed some congestion.  However her BNP is only 200.  Urinalysis showed large leuks and 21-50 whites.  Patient given Rocephin for UTI.  CT chest abdomen pelvis is pending at signout to find out the etiology of her hypotension and her poor appetite. Anticipate admission regardless of the result.  Signed out to Dr. Laverta Baltimore in the ED.     Final Clinical Impression(s) / ED Diagnoses Final diagnoses:  None    Rx / DC Orders ED Discharge Orders     None        Drenda Freeze, MD 11/05/21 2339

## 2021-11-04 NOTE — ED Provider Notes (Signed)
Blood pressure (!) 160/84, pulse 99, temperature 98.5 F (36.9 C), temperature source Oral, resp. rate (!) 24, SpO2 98 %.  Assuming care from Dr. Darl Householder.  In short, Claudia Keller is a 70 y.o. female with a chief complaint of No chief complaint on file. Marland Kitchen  Refer to the original H&P for additional details.  The current plan of care is to follow up on CTA chest and CT abdomen/pelvis.   11:20 PM  Patient's CTA resulting with large pericardial effusion w/ pericardial enhancement and question of a exudative effusion. CT abdomen/pelvis unremarkable. No PE. Updated patient. She remains confused and remains on supplemental O2. Will consult Cardiology.   Spoke with Dr. Humphrey Rolls with Cardiology. Agree that patient does not currently have any tamponade physiology.  EKG similar to prior tracing from October.  Cardiology will follow and coordinate the echo in the morning.  Patient does have a left-sided pleural effusion which may be contributing to her hypoxemia.  Plan to send the urine for culture and will admit.   Discussed patient's case with [facility] to request admission. Patient and family (if present) updated with plan. Care transferred to [facility] service.  I reviewed all nursing notes, vitals, pertinent old records, EKGs, labs, imaging (as available).      Margette Fast, MD 11/05/21 [phone]

## 2021-11-04 NOTE — ED Triage Notes (Signed)
Pt bib [facility] from [facility] with complaints of ams x2 days. Facility had blood drawn and sent her out today for abnormal labs. Pt normally AOx4 but is now only AOx2. Upon arrival EMS found pt 87% on RA and placed her on 2L [place] raising her sats to 98%.  EMS vitals: 134/76, 100HR, 22R, 98% on 2L [place],144 CBG

## 2021-11-04 NOTE — ED Notes (Signed)
Son Claudia Keller wants an update when available [phone]

## 2021-11-04 NOTE — ED Notes (Signed)
Patients sats went down to 86% on room air. Patient put on 2 Liters of oxygen

## 2021-11-04 NOTE — ED Notes (Addendum)
Patient transported to CT and X ray 

## 2021-11-04 NOTE — ED Notes (Signed)
Changed patients brief

## 2021-11-05 ENCOUNTER — Inpatient Hospital Stay (HOSPITAL_COMMUNITY): Payer: Medicare (Managed Care)

## 2021-11-05 ENCOUNTER — Other Ambulatory Visit: Payer: Self-pay

## 2021-11-05 ENCOUNTER — Encounter (HOSPITAL_COMMUNITY): Payer: Self-pay | Admitting: Family Medicine

## 2021-11-05 DIAGNOSIS — F17210 Nicotine dependence, cigarettes, uncomplicated: Secondary | ICD-10-CM | POA: Diagnosis present

## 2021-11-05 DIAGNOSIS — N390 Urinary tract infection, site not specified: Secondary | ICD-10-CM | POA: Diagnosis present

## 2021-11-05 DIAGNOSIS — J9601 Acute respiratory failure with hypoxia: Secondary | ICD-10-CM | POA: Diagnosis present

## 2021-11-05 DIAGNOSIS — F0390 Unspecified dementia without behavioral disturbance: Secondary | ICD-10-CM | POA: Insufficient documentation

## 2021-11-05 DIAGNOSIS — F0150 Vascular dementia without behavioral disturbance: Secondary | ICD-10-CM | POA: Diagnosis present

## 2021-11-05 DIAGNOSIS — R410 Disorientation, unspecified: Secondary | ICD-10-CM | POA: Diagnosis not present

## 2021-11-05 DIAGNOSIS — I5030 Unspecified diastolic (congestive) heart failure: Secondary | ICD-10-CM | POA: Diagnosis not present

## 2021-11-05 DIAGNOSIS — R4182 Altered mental status, unspecified: Secondary | ICD-10-CM

## 2021-11-05 DIAGNOSIS — Z8679 Personal history of other diseases of the circulatory system: Secondary | ICD-10-CM

## 2021-11-05 DIAGNOSIS — I5033 Acute on chronic diastolic (congestive) heart failure: Secondary | ICD-10-CM | POA: Diagnosis present

## 2021-11-05 DIAGNOSIS — Z8616 Personal history of COVID-19: Secondary | ICD-10-CM | POA: Diagnosis not present

## 2021-11-05 DIAGNOSIS — I3139 Other pericardial effusion (noninflammatory): Secondary | ICD-10-CM | POA: Diagnosis not present

## 2021-11-05 DIAGNOSIS — G9341 Metabolic encephalopathy: Secondary | ICD-10-CM | POA: Diagnosis present

## 2021-11-05 DIAGNOSIS — I959 Hypotension, unspecified: Secondary | ICD-10-CM | POA: Diagnosis present

## 2021-11-05 DIAGNOSIS — D6832 Hemorrhagic disorder due to extrinsic circulating anticoagulants: Secondary | ICD-10-CM | POA: Diagnosis present

## 2021-11-05 DIAGNOSIS — N183 Chronic kidney disease, stage 3 unspecified: Secondary | ICD-10-CM

## 2021-11-05 DIAGNOSIS — R0902 Hypoxemia: Secondary | ICD-10-CM | POA: Diagnosis present

## 2021-11-05 DIAGNOSIS — N182 Chronic kidney disease, stage 2 (mild): Secondary | ICD-10-CM | POA: Diagnosis present

## 2021-11-05 DIAGNOSIS — J449 Chronic obstructive pulmonary disease, unspecified: Secondary | ICD-10-CM | POA: Diagnosis present

## 2021-11-05 DIAGNOSIS — E039 Hypothyroidism, unspecified: Secondary | ICD-10-CM | POA: Diagnosis present

## 2021-11-05 DIAGNOSIS — Z515 Encounter for palliative care: Secondary | ICD-10-CM | POA: Diagnosis not present

## 2021-11-05 DIAGNOSIS — E782 Mixed hyperlipidemia: Secondary | ICD-10-CM | POA: Diagnosis present

## 2021-11-05 DIAGNOSIS — F0280 Dementia in other diseases classified elsewhere without behavioral disturbance: Secondary | ICD-10-CM | POA: Diagnosis present

## 2021-11-05 DIAGNOSIS — I312 Hemopericardium, not elsewhere classified: Secondary | ICD-10-CM | POA: Diagnosis present

## 2021-11-05 DIAGNOSIS — Z86010 Personal history of colonic polyps: Secondary | ICD-10-CM | POA: Diagnosis not present

## 2021-11-05 DIAGNOSIS — I69354 Hemiplegia and hemiparesis following cerebral infarction affecting left non-dominant side: Secondary | ICD-10-CM | POA: Diagnosis not present

## 2021-11-05 DIAGNOSIS — R9431 Abnormal electrocardiogram [ECG] [EKG]: Secondary | ICD-10-CM

## 2021-11-05 DIAGNOSIS — I480 Paroxysmal atrial fibrillation: Secondary | ICD-10-CM | POA: Diagnosis present

## 2021-11-05 DIAGNOSIS — I319 Disease of pericardium, unspecified: Secondary | ICD-10-CM | POA: Diagnosis not present

## 2021-11-05 DIAGNOSIS — I10 Essential (primary) hypertension: Secondary | ICD-10-CM | POA: Diagnosis not present

## 2021-11-05 DIAGNOSIS — E1122 Type 2 diabetes mellitus with diabetic chronic kidney disease: Secondary | ICD-10-CM | POA: Diagnosis present

## 2021-11-05 DIAGNOSIS — N179 Acute kidney failure, unspecified: Secondary | ICD-10-CM | POA: Diagnosis present

## 2021-11-05 DIAGNOSIS — Z8673 Personal history of transient ischemic attack (TIA), and cerebral infarction without residual deficits: Secondary | ICD-10-CM | POA: Diagnosis not present

## 2021-11-05 DIAGNOSIS — F32A Depression, unspecified: Secondary | ICD-10-CM | POA: Diagnosis present

## 2021-11-05 DIAGNOSIS — I130 Hypertensive heart and chronic kidney disease with heart failure and stage 1 through stage 4 chronic kidney disease, or unspecified chronic kidney disease: Secondary | ICD-10-CM | POA: Diagnosis present

## 2021-11-05 DIAGNOSIS — K219 Gastro-esophageal reflux disease without esophagitis: Secondary | ICD-10-CM | POA: Diagnosis present

## 2021-11-05 DIAGNOSIS — N3000 Acute cystitis without hematuria: Secondary | ICD-10-CM | POA: Diagnosis not present

## 2021-11-05 DIAGNOSIS — Z66 Do not resuscitate: Secondary | ICD-10-CM | POA: Diagnosis present

## 2021-11-05 HISTORY — DX: Urinary tract infection, site not specified: N39.0

## 2021-11-05 HISTORY — DX: Altered mental status, unspecified: R41.82

## 2021-11-05 LAB — ECHOCARDIOGRAM COMPLETE
AR max vel: 2.39 cm2
AV Area VTI: 2.67 cm2
AV Area mean vel: 2.31 cm2
AV Mean grad: 4 mmHg
AV Peak grad: 6.9 mmHg
Ao pk vel: 1.31 m/s
Area-P 1/2: 4.57 cm2
Height: 63.5 in
S' Lateral: 2.4 cm
Weight: 2160 oz

## 2021-11-05 LAB — TROPONIN I (HIGH SENSITIVITY)
Troponin I (High Sensitivity): 12 ng/L (ref <18)
Troponin I (High Sensitivity): 12 ng/L (ref <18)
Troponin I (High Sensitivity): 12 ng/L (ref <18)
Troponin I (High Sensitivity): 9 ng/L (ref <18)

## 2021-11-05 LAB — CBC
HCT: 32.3 % — ABNORMAL LOW (ref 36.0–46.0)
Hemoglobin: 10.7 g/dL — ABNORMAL LOW (ref 12.0–15.0)
MCH: 31.8 pg (ref 26.0–34.0)
MCHC: 33.1 g/dL (ref 30.0–36.0)
MCV: 96.1 fL (ref 80.0–100.0)
Platelets: 272 10*3/uL (ref 150–400)
RBC: 3.36 MIL/uL — ABNORMAL LOW (ref 3.87–5.11)
RDW: 15.1 % (ref 11.5–15.5)
WBC: 7.6 10*3/uL (ref 4.0–10.5)
nRBC: 0 % (ref 0.0–0.2)

## 2021-11-05 LAB — COMPREHENSIVE METABOLIC PANEL
ALT: 27 U/L (ref 0–44)
AST: 44 U/L — ABNORMAL HIGH (ref 15–41)
Albumin: 2.2 g/dL — ABNORMAL LOW (ref 3.5–5.0)
Alkaline Phosphatase: 61 U/L (ref 38–126)
Anion gap: 11 (ref 5–15)
BUN: 54 mg/dL — ABNORMAL HIGH (ref 8–23)
CO2: 26 mmol/L (ref 22–32)
Calcium: 9.1 mg/dL (ref 8.9–10.3)
Chloride: 96 mmol/L — ABNORMAL LOW (ref 98–111)
Creatinine, Ser: 1.05 mg/dL — ABNORMAL HIGH (ref 0.44–1.00)
GFR, Estimated: 58 mL/min — ABNORMAL LOW (ref >60)
Glucose, Bld: 210 mg/dL — ABNORMAL HIGH (ref 70–99)
Potassium: 4.6 mmol/L (ref 3.5–5.1)
Sodium: 133 mmol/L — ABNORMAL LOW (ref 135–145)
Total Bilirubin: 1.1 mg/dL (ref 0.3–1.2)
Total Protein: 5.5 g/dL — ABNORMAL LOW (ref 6.5–8.1)

## 2021-11-05 LAB — URINE CULTURE

## 2021-11-05 LAB — HEMOGLOBIN A1C
Hgb A1c MFr Bld: 7.8 % — ABNORMAL HIGH (ref 4.8–5.6)
Mean Plasma Glucose: 177.16 mg/dL

## 2021-11-05 LAB — CBG MONITORING, ED: Glucose-Capillary: 184 mg/dL — ABNORMAL HIGH (ref 70–99)

## 2021-11-05 LAB — GLUCOSE, CAPILLARY
Glucose-Capillary: 204 mg/dL — ABNORMAL HIGH (ref 70–99)
Glucose-Capillary: 207 mg/dL — ABNORMAL HIGH (ref 70–99)
Glucose-Capillary: 167 mg/dL — ABNORMAL HIGH (ref 70–99)

## 2021-11-05 LAB — C-REACTIVE PROTEIN: CRP: 7.4 mg/dL — ABNORMAL HIGH (ref <1.0)

## 2021-11-05 LAB — TSH: TSH: 0.621 u[IU]/mL (ref 0.350–4.500)

## 2021-11-05 IMAGING — MR MR HEAD W/O CM
6 of 10 series · 29 of 48 positions shown · non-contrast
Comparison: CT head dated 1 day prior, MR head [DATE]

CLINICAL DATA: Altered mental status, recent history of multiple
territory strokes

EXAM:
MRI HEAD WITHOUT CONTRAST
TECHNIQUE: Multiplanar, multiecho pulse sequences of the brain and surrounding
structures were obtained without intravenous contrast.

[Series 2: DWI · axial · 3.0mm · 0.94mm/px · z∈[-40,+100]mm · 9 of 96 slices shown (1 of 2)]
[im 1/96]
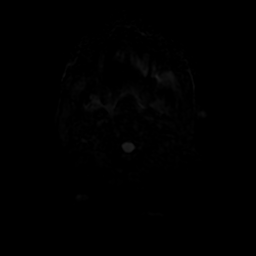
[im 12/96]
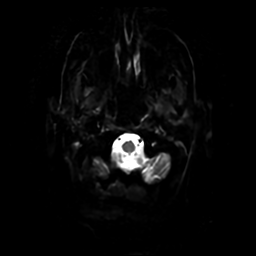
[im 24/96]
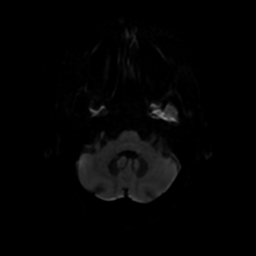
[im 36/96]
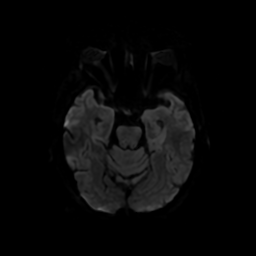
[im 48/96]
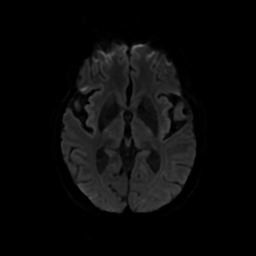
[im 60/96]
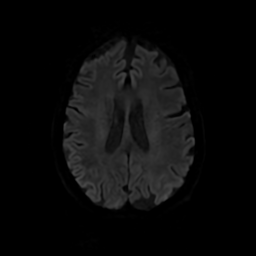
[im 72/96]
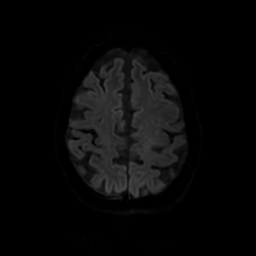
[im 84/96]
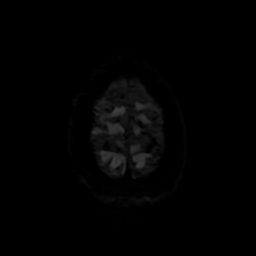
[im 96/96]
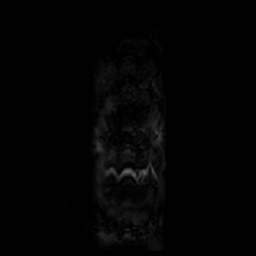

[Series 3: DWI · coronal · 4.0mm · 0.94mm/px · 7 of 74 slices shown (2 of 2)]
[im 1/74]
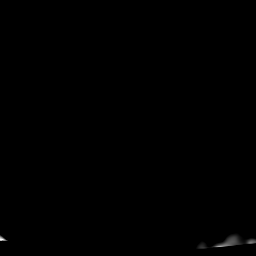
[im 13/74]
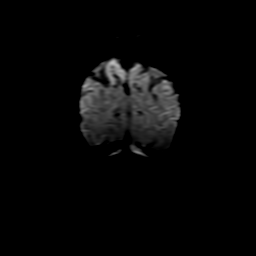
[im 25/74]
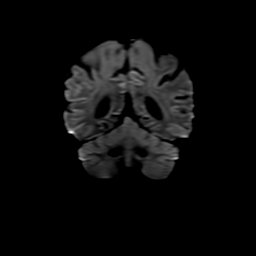
[im 37/74]
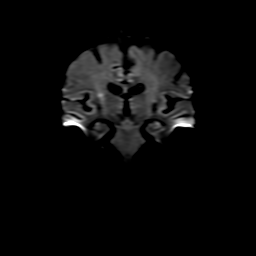
[im 49/74]
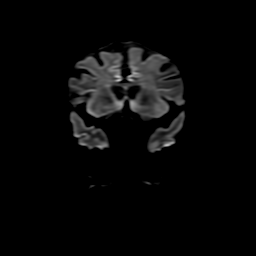
[im 61/74]
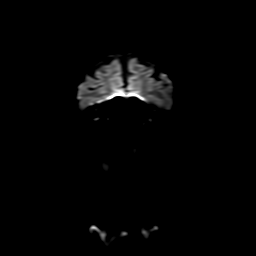
[im 74/74]
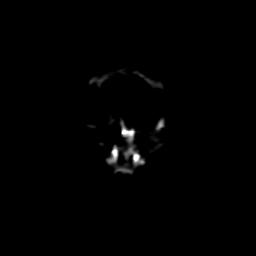

[Series 4: FLAIR · sagittal · 5.0mm · 0.23mm/px · 2 of 24 slices shown (1 of 2)]
[im 1/24]
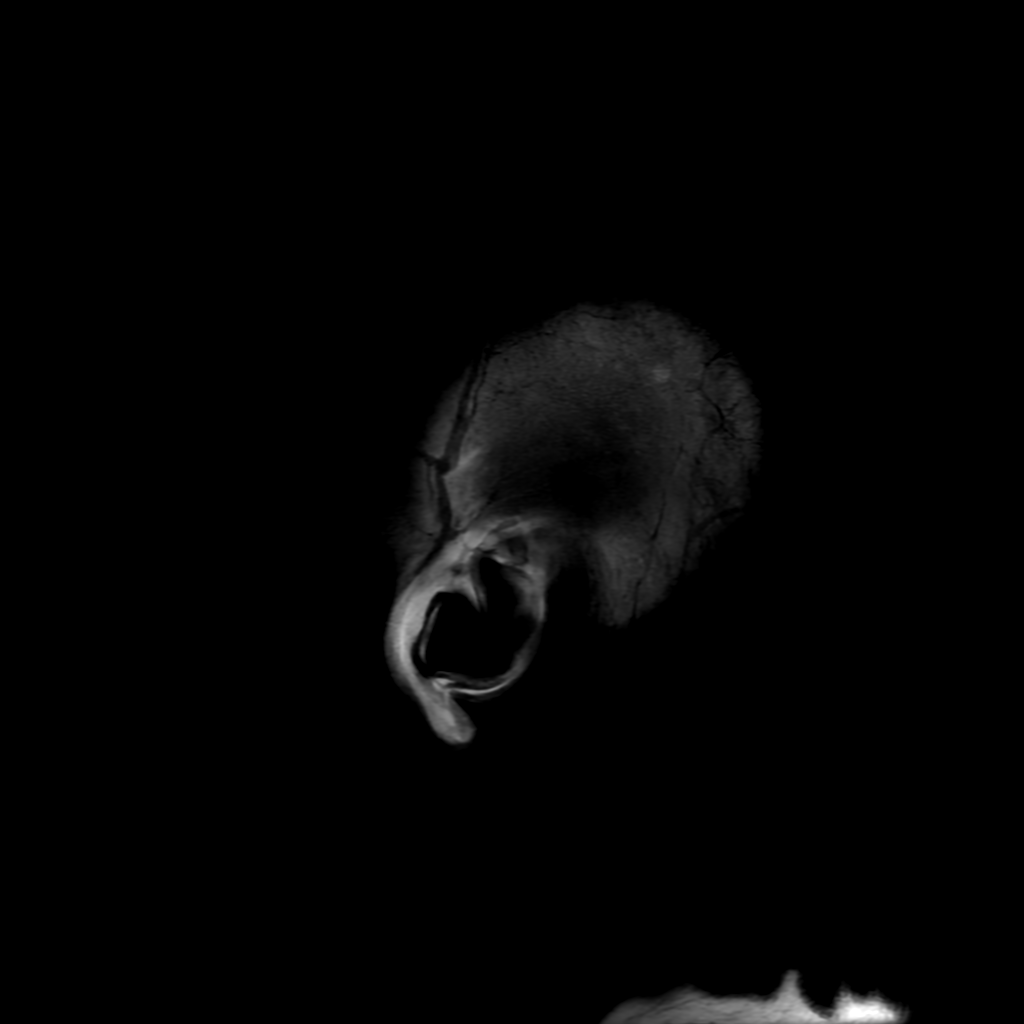
[im 24/24]
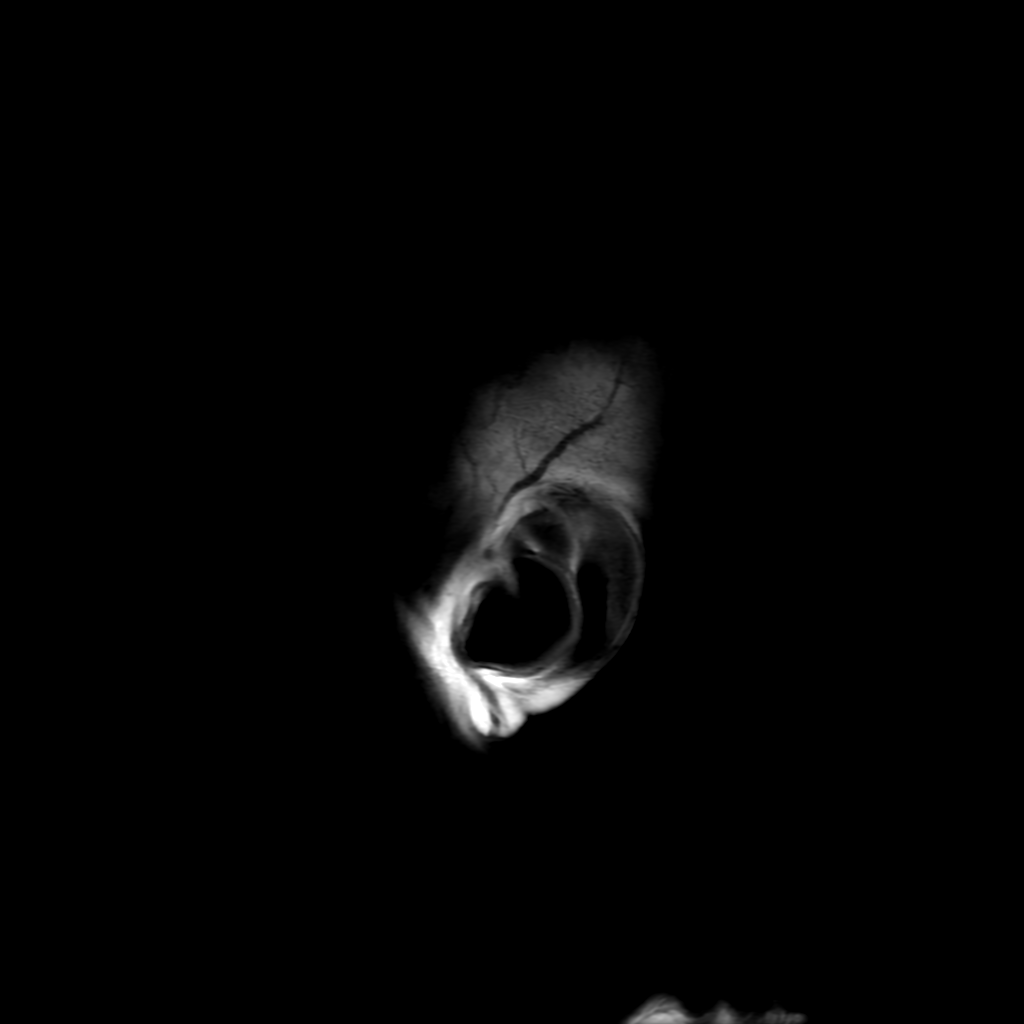

[Series 6: FLAIR · axial · 4.0mm · 0.45mm/px · z∈[-42,+98]mm · 3 of 34 slices shown (2 of 2)]
[im 1/34]
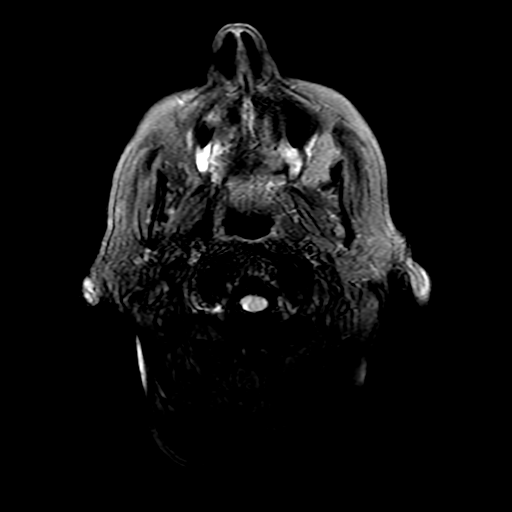
[im 17/34]
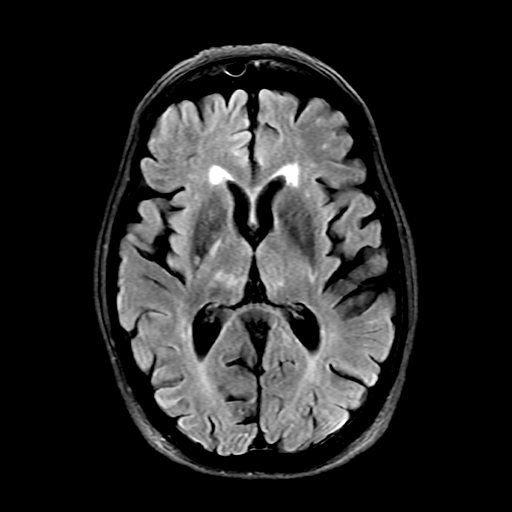
[im 34/34]
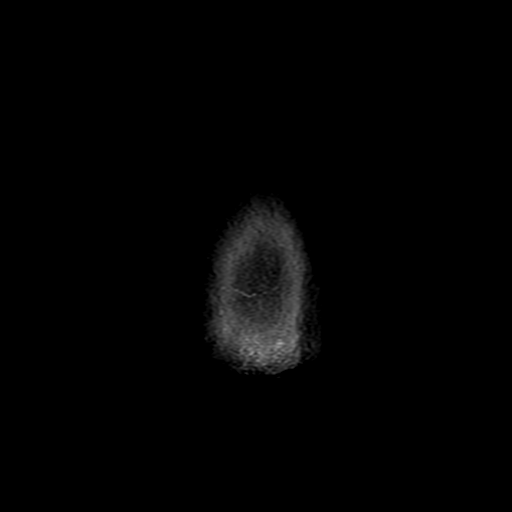

[Series 250: ADC · axial · 3.0mm · 0.94mm/px · z∈[-40,+100]mm · 5 of 50 slices shown (1 of 2)]
[im 1/50]
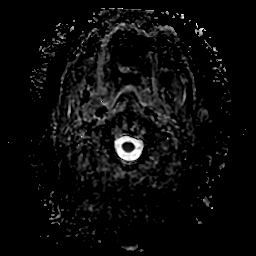
[im 13/50]
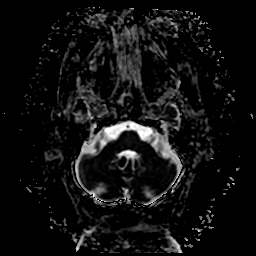
[im 25/50]
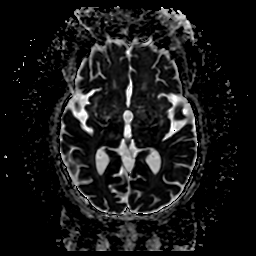
[im 37/50]
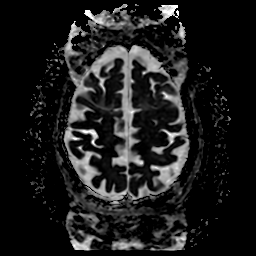
[im 50/50]
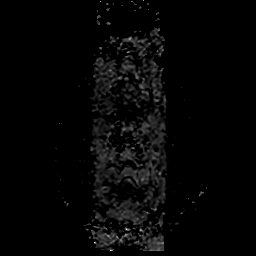

[Series 350: ADC · coronal · 4.0mm · 0.94mm/px · 3 of 35 slices shown (2 of 2)]
[im 1/35]
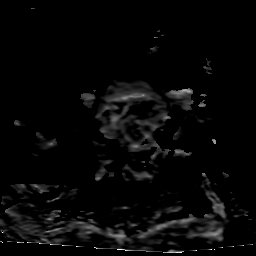
[im 18/35]
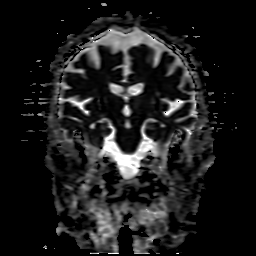
[im 35/35]
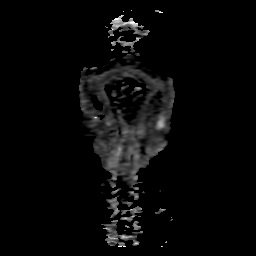

[29 of 48 positions shown; findings below may reference images not displayed]

FINDINGS: Brain: There is faintly elevated DWI signal in the posterior limb of
the right internal capsule and lentiform nucleus without convincing
low ADC signal. There is associated FLAIR signal abnormality. This
signal abnormality was present on the brain MRI from [DATE] and
likely reflects late subacute infarct. Curvilinear SWI signal
dropout in this region likely reflects petechial blood products.

There is an additional faint focus of diffusion restriction in the
posterior limb of the left internal capsule with mildly low ADC
signal and FLAIR signal abnormality which was not present on the
prior brain MRI, likely reflecting more recent early subacute
stroke. There is no evidence of associated hemorrhage.

There is no other evidence of acute infarct. There is no evidence of
acute intracranial hemorrhage or extra-axial fluid collection.

There is unchanged mild global parenchymal volume loss with
prominence of the ventricular system and extra-axial CSF spaces.
Additional foci of FLAIR signal abnormality in the subcortical and
periventricular white matter likely reflect sequela of chronic white
matter microangiopathy. A few additional remote lacunar infarcts are
unchanged.

There is no solid mass lesion.  There is no midline shift.

Vascular: Normal flow voids.

Skull and upper cervical spine: Normal marrow signal.

Sinuses/Orbits: The paranasal sinuses are clear. The globes and
orbits are unremarkable.

Other: There are bilateral mastoid effusions, unchanged.
IMPRESSION: 1. Small infarct in the left internal capsule is new since the prior
brain MRI of [DATE], likely subacute in chronicity.
2. Evolving late subacute to early chronic infarcts in the right
internal capsule/basal ganglia.
3. No other acute infarct or other acute intracranial pathology.

## 2021-11-05 MED ORDER — MIRTAZAPINE 15 MG PO TABS
15.0000 mg | ORAL_TABLET | Freq: Every day | ORAL | Status: DC
  Administered 2021-11-05 – 2021-11-09 (×5): 15 mg via ORAL
  Filled 2021-11-05 (×5): qty 1

## 2021-11-05 MED ORDER — IBUPROFEN 400 MG PO TABS
600.0000 mg | ORAL_TABLET | Freq: Three times a day (TID) | ORAL | Status: DC
  Administered 2021-11-05: 06:00:00 600 mg via ORAL
  Filled 2021-11-05: qty 1

## 2021-11-05 MED ORDER — OYSTER SHELL CALCIUM/D3 500-5 MG-MCG PO TABS
1.0000 | ORAL_TABLET | Freq: Two times a day (BID) | ORAL | Status: DC
  Administered 2021-11-05 – 2021-11-10 (×10): 1 via ORAL
  Filled 2021-11-05 (×12): qty 1

## 2021-11-05 MED ORDER — SENNOSIDES-DOCUSATE SODIUM 8.6-50 MG PO TABS: 1.0000 | ORAL_TABLET | Freq: Every evening | ORAL | Status: DC | PRN

## 2021-11-05 MED ORDER — ACETAMINOPHEN 325 MG PO TABS
650.0000 mg | ORAL_TABLET | Freq: Four times a day (QID) | ORAL | Status: DC | PRN
  Administered 2021-11-06: 22:00:00 650 mg via ORAL
  Filled 2021-11-05: qty 2

## 2021-11-05 MED ORDER — ASPIRIN 81 MG PO CHEW: 81.0000 mg | CHEWABLE_TABLET | Freq: Every day | ORAL | Status: DC

## 2021-11-05 MED ORDER — LEVOTHYROXINE SODIUM 100 MCG PO TABS
200.0000 ug | ORAL_TABLET | Freq: Every day | ORAL | Status: DC
  Administered 2021-11-06 – 2021-11-10 (×5): 200 ug via ORAL
  Filled 2021-11-05 (×5): qty 2

## 2021-11-05 MED ORDER — APIXABAN 5 MG PO TABS
5.0000 mg | ORAL_TABLET | Freq: Two times a day (BID) | ORAL | Status: DC
  Administered 2021-11-05 (×2): 5 mg via ORAL
  Filled 2021-11-05 (×2): qty 1

## 2021-11-05 MED ORDER — DIVALPROEX SODIUM ER 500 MG PO TB24
1000.0000 mg | ORAL_TABLET | Freq: Every evening | ORAL | Status: DC
  Administered 2021-11-05 – 2021-11-10 (×6): 1000 mg via ORAL
  Filled 2021-11-05 (×7): qty 2

## 2021-11-05 MED ORDER — SODIUM CHLORIDE 0.9 % IV SOLN: 1.0000 g | INTRAVENOUS | Status: DC

## 2021-11-05 MED ORDER — INSULIN ASPART 100 UNIT/ML IJ SOLN
0.0000 [IU] | Freq: Every day | INTRAMUSCULAR | Status: DC
  Administered 2021-11-06 – 2021-11-08 (×3): 2 [IU] via SUBCUTANEOUS

## 2021-11-05 MED ORDER — COLCHICINE 0.6 MG PO TABS
0.6000 mg | ORAL_TABLET | Freq: Every day | ORAL | Status: DC
  Administered 2021-11-05 – 2021-11-10 (×5): 0.6 mg via ORAL
  Filled 2021-11-05 (×6): qty 1

## 2021-11-05 MED ORDER — FUROSEMIDE 10 MG/ML IJ SOLN
40.0000 mg | Freq: Once | INTRAMUSCULAR | Status: AC
  Administered 2021-11-05: 02:00:00 40 mg via INTRAVENOUS
  Filled 2021-11-05: qty 4

## 2021-11-05 MED ORDER — PAROXETINE HCL 20 MG PO TABS
40.0000 mg | ORAL_TABLET | Freq: Every day | ORAL | Status: DC
  Administered 2021-11-05 – 2021-11-10 (×5): 40 mg via ORAL
  Filled 2021-11-05 (×7): qty 2

## 2021-11-05 MED ORDER — SODIUM CHLORIDE 0.9 % IV SOLN
250.0000 mL | INTRAVENOUS | Status: DC | PRN
  Administered 2021-11-07: 500 mL via INTRAVENOUS
  Administered 2021-11-08: 250 mL via INTRAVENOUS

## 2021-11-05 MED ORDER — FUROSEMIDE 10 MG/ML IJ SOLN
40.0000 mg | Freq: Two times a day (BID) | INTRAMUSCULAR | Status: DC
  Administered 2021-11-05 – 2021-11-06 (×2): 40 mg via INTRAVENOUS
  Filled 2021-11-05 (×2): qty 4

## 2021-11-05 MED ORDER — FREESTYLE LIBRE 2 SENSOR MISC: 1.0000 | Status: DC

## 2021-11-05 MED ORDER — DIVALPROEX SODIUM 250 MG PO DR TAB
250.0000 mg | DELAYED_RELEASE_TABLET | Freq: Every day | ORAL | Status: DC
  Administered 2021-11-05 – 2021-11-10 (×5): 250 mg via ORAL
  Filled 2021-11-05 (×6): qty 1

## 2021-11-05 MED ORDER — ACETAMINOPHEN 650 MG RE SUPP: 650.0000 mg | Freq: Four times a day (QID) | RECTAL | Status: DC | PRN

## 2021-11-05 MED ORDER — VITAMIN B-12 1000 MCG PO TABS
2000.0000 ug | ORAL_TABLET | Freq: Every day | ORAL | Status: DC
  Administered 2021-11-05 – 2021-11-10 (×5): 2000 ug via ORAL
  Filled 2021-11-05 (×6): qty 2

## 2021-11-05 MED ORDER — DILTIAZEM HCL ER COATED BEADS 120 MG PO CP24
120.0000 mg | ORAL_CAPSULE | Freq: Every day | ORAL | Status: DC
  Administered 2021-11-05 – 2021-11-10 (×5): 120 mg via ORAL
  Filled 2021-11-05 (×7): qty 1

## 2021-11-05 MED ORDER — SODIUM CHLORIDE 0.9% FLUSH
3.0000 mL | Freq: Two times a day (BID) | INTRAVENOUS | Status: DC
  Administered 2021-11-05 – 2021-11-10 (×11): 3 mL via INTRAVENOUS

## 2021-11-05 MED ORDER — SODIUM CHLORIDE 0.9% FLUSH
3.0000 mL | INTRAVENOUS | Status: DC | PRN
  Administered 2021-11-05: 13:00:00 3 mL via INTRAVENOUS

## 2021-11-05 MED ORDER — ALBUTEROL SULFATE (2.5 MG/3ML) 0.083% IN NEBU: 2.5000 mg | INHALATION_SOLUTION | RESPIRATORY_TRACT | Status: DC | PRN

## 2021-11-05 MED ORDER — QUETIAPINE FUMARATE 100 MG PO TABS
100.0000 mg | ORAL_TABLET | Freq: Every day | ORAL | Status: DC
  Administered 2021-11-05 – 2021-11-09 (×5): 100 mg via ORAL
  Filled 2021-11-05 (×5): qty 1

## 2021-11-05 MED ORDER — PANTOPRAZOLE SODIUM 40 MG PO TBEC
40.0000 mg | DELAYED_RELEASE_TABLET | Freq: Every day | ORAL | Status: DC
  Administered 2021-11-07 – 2021-11-10 (×4): 40 mg via ORAL
  Filled 2021-11-05 (×5): qty 1

## 2021-11-05 MED ORDER — SODIUM CHLORIDE 0.9 % IV SOLN
1.0000 g | INTRAVENOUS | Status: DC
  Administered 2021-11-05 – 2021-11-08 (×4): 1 g via INTRAVENOUS
  Filled 2021-11-05 (×5): qty 10

## 2021-11-05 MED ORDER — ATORVASTATIN CALCIUM 80 MG PO TABS
80.0000 mg | ORAL_TABLET | Freq: Every day | ORAL | Status: DC
  Administered 2021-11-05 – 2021-11-09 (×5): 80 mg via ORAL
  Filled 2021-11-05 (×5): qty 1
  Filled 2021-11-05: qty 2

## 2021-11-05 MED ORDER — INSULIN ASPART 100 UNIT/ML IJ SOLN
0.0000 [IU] | Freq: Three times a day (TID) | INTRAMUSCULAR | Status: DC
  Administered 2021-11-05: 13:00:00 2 [IU] via SUBCUTANEOUS
  Administered 2021-11-05: 18:00:00 3 [IU] via SUBCUTANEOUS
  Administered 2021-11-06: 12:00:00 1 [IU] via SUBCUTANEOUS
  Administered 2021-11-06 – 2021-11-07 (×2): 2 [IU] via SUBCUTANEOUS
  Administered 2021-11-07: 1 [IU] via SUBCUTANEOUS
  Administered 2021-11-07 – 2021-11-08 (×2): 5 [IU] via SUBCUTANEOUS
  Administered 2021-11-08 (×2): 2 [IU] via SUBCUTANEOUS
  Administered 2021-11-09: 3 [IU] via SUBCUTANEOUS
  Administered 2021-11-09 (×2): 2 [IU] via SUBCUTANEOUS

## 2021-11-05 NOTE — Progress Notes (Signed)
PROGRESS NOTE    Claudia Keller  NIO:[id number] DOB: Mar 27, 1951 DOA: 11/04/2021 PCP: [facility] [place]    Brief Narrative:  70 year old with history of hypertension, COPD, type 2 diabetes not on treatment, chronic diastolic heart failure, hypothyroidism, recently stroke with left hemiparesis, history of atrial fibrillation and currently a long-term nursing home resident brought from facility with sodium 146 and hypoxemic 87% on room air.  Family reported not acting quite right since last few days.  Patient is confused.  Further history taken from patient's son. In the emergency room CT scan of the chest abdomen pelvis was done, no evidence of pulmonary embolism.  She was found to have large pericardial effusion on CT scan.  She is on room air.  Electrolytes are normal.  COVID-19 negative.  Urinalysis abnormal.  Admitted to the hospital with UTI, metabolic encephalopathy.   Assessment & Plan:   Principal Problem:   (HFpEF) heart failure with preserved ejection fraction (HCC) Active Problems:   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 (HCC)   Essential hypertension, benign   COPD (chronic obstructive pulmonary disease) (HCC)   Pericarditis   Hypoxemia   UTI (urinary tract infection)   CKD (chronic kidney disease) stage 3, GFR 30-59 ml/min (HCC)   Prolonged QT interval   AMS (altered mental status)  Acute UTI present on admission infective metabolic encephalopathy in a patient with vascular dementia due to multiple stroke: Started on Rocephin.  Urine culture pending. All-time fall precautions.  Delirium precautions. Rule out ongoing thromboembolic stroke, MRI of the brain today.  PT/OT. Documented history of atrial fibrillation, discharged on Eliquis but fallen off the treatment plan when transferred from on his SNF to another SNF, resume Eliquis 5 mg twice daily.  Discontinue aspirin. Patient is on symptomatic treatment with Seroquel, Depakote, Paxil.   Continue.  Pericardial effusion: Reported on CT chest.  Patient on room air.  No evidence of decompensation or tamponade.  Echocardiogram pending.  Discontinue high-dose ibuprofen to avoid complications.  Continue colchicine. TSH normal.  Hypothyroidism: On Synthroid.  Continue.  Type 2 diabetes: Diet controlled.  Continue monitoring and keep on sliding scale insulin.  Hypoxemia: Cause unknown.  Currently not hypoxemic.  Suspected diastolic heart failure exacerbation: Symptomatic treatment with IV Lasix.  Intake output monitoring.  Renal function monitoring.  Echo pending.   DVT prophylaxis:  apixaban (ELIQUIS) tablet 5 mg   Code Status: Full code.  Will consult palliative care Family Communication: Son on the phone. Disposition Plan: Status is: Inpatient  Remains inpatient appropriate because: Significant altered mental status         Consultants:  Palliative care  Procedures:  None  Antimicrobials:  Rocephin 12/28-   Subjective: Patient seen and examined.  Pleasantly confused.  She is not aware that she is in the hospital.  She thinks she is in her house at [place].  Denies any complaints but poor historian.  Discussed with patient's son over the phone, reviewed previous discharge from [facility] 2 months ago.  She was recently moved to long-term care at [facility] and noted to be more confused and less participating with therapies last few days.  Objective: Vitals:   11/05/21 0600 11/05/21 0608 11/05/21 0724 11/05/21 0930  BP: 136/78  (!) 147/79 (!) 160/88  Pulse: 86  85 71  Resp: 16  17 17   Temp:   98.2 F (36.8 C)   TempSrc:   Oral   SpO2: 91%  93% 92%  Weight:  61.2 kg    Height:  5' 3.5" (1.613 m)      Intake/Output Summary (Last 24 hours) at 11/05/2021 1108 Last data filed at 11/05/2021 0458 Gross per 24 hour  Intake --  Output 1000 ml  Net -1000 ml   Filed Weights   11/05/21 0608  Weight: 61.2 kg    Examination:  General  exam: Appears anxious.  Pleasantly confused.  Alert oriented x0.  Moves all extremities. Respiratory system: Clear to auscultation. Respiratory effort normal. Cardiovascular system: S1 & S2 heard, RRR. No JVD, murmurs, rubs, gallops or clicks. No pedal edema. Gastrointestinal system: Abdomen is nondistended, soft and nontender. No organomegaly or masses felt. Normal bowel sounds heard. Central nervous system: Alert and awake.  Anxious and pleasant, confused.  Moves all extremities.    Data Reviewed: I have personally reviewed following labs and imaging studies  CBC: Recent Labs  Lab 11/04/21 1959 11/04/21 2006 11/05/21 0425  WBC 7.5  --  7.6  NEUTROABS 5.0  --   --   HGB 12.6 11.6* 10.7*  HCT 38.1 34.0* 32.3*  MCV 95.3  --  96.1  PLT 290  --  269   Basic Metabolic Panel: Recent Labs  Lab 11/04/21 1959 11/04/21 2006 11/05/21 0425  NA 136 139 133*  K 4.6 4.5 4.6  CL 99  --  96*  CO2 28  --  26  GLUCOSE 164*  --  210*  BUN 63*  --  54*  CREATININE 1.25*  --  1.05*  CALCIUM 10.1  --  9.1   GFR: Estimated Creatinine Clearance: 42.8 mL/min (A) (by C-G formula based on SCr of 1.05 mg/dL (H)). Liver Function Tests: Recent Labs  Lab 11/04/21 1959 11/05/21 0425  AST 42* 44*  ALT 25 27  ALKPHOS 70 61  BILITOT 0.7 1.1  PROT 6.2* 5.5*  ALBUMIN 2.4* 2.2*   No results for input(s): LIPASE, AMYLASE in the last 168 hours. No results for input(s): AMMONIA in the last 168 hours. Coagulation Profile: No results for input(s): INR, PROTIME in the last 168 hours. Cardiac Enzymes: No results for input(s): CKTOTAL, CKMB, CKMBINDEX, TROPONINI in the last 168 hours. BNP (last 3 results) No results for input(s): PROBNP in the last 8760 hours. HbA1C: Recent Labs    11/05/21 0425  HGBA1C 7.8*   CBG: No results for input(s): GLUCAP in the last 168 hours. Lipid Profile: No results for input(s): CHOL, HDL, LDLCALC, TRIG, CHOLHDL, LDLDIRECT in the last 72 hours. Thyroid Function  Tests: Recent Labs    11/05/21 0425  TSH 0.621   Anemia Panel: No results for input(s): VITAMINB12, FOLATE, FERRITIN, TIBC, IRON, RETICCTPCT in the last 72 hours. Sepsis Labs: No results for input(s): PROCALCITON, LATICACIDVEN in the last 168 hours.  Recent Results (from the past 240 hour(s))  Resp Panel by RT-PCR (Flu A&B, Covid) Nasopharyngeal Swab     Status: None   Collection Time: 11/04/21  7:23 PM   Specimen: Nasopharyngeal Swab; Nasopharyngeal(NP) swabs in vial transport medium  Result Value Ref Range Status   SARS Coronavirus 2 by RT PCR NEGATIVE NEGATIVE Final    Comment: (NOTE) SARS-CoV-2 target nucleic acids are NOT DETECTED.  The SARS-CoV-2 RNA is generally detectable in upper respiratory specimens during the acute phase of infection. The lowest concentration of SARS-CoV-2 viral copies this assay can detect is 138 copies/mL. A negative result does not preclude SARS-Cov-2 infection and should not be used as the sole basis for treatment or other  patient management decisions. A negative result may occur with  improper specimen collection/handling, submission of specimen other than nasopharyngeal swab, presence of viral mutation(s) within the areas targeted by this assay, and inadequate number of viral copies(<138 copies/mL). A negative result must be combined with clinical observations, patient history, and epidemiological information. The expected result is Negative.  Fact Sheet for Patients:  EntrepreneurPulse.com.au  Fact Sheet for Healthcare Providers:  IncredibleEmployment.be  This test is no t yet approved or cleared by the [place] FDA and  has been authorized for detection and/or diagnosis of SARS-CoV-2 by FDA under an Emergency Use Authorization (EUA). This EUA will remain  in effect (meaning this test can be used) for the duration of the COVID-19 declaration under Section 564(b)(1) of the Act, 21 U.S.C.section  360bbb-3(b)(1), unless the authorization is terminated  or revoked sooner.       Influenza A by PCR NEGATIVE NEGATIVE Final   Influenza B by PCR NEGATIVE NEGATIVE Final    Comment: (NOTE) The Xpert Xpress SARS-CoV-2/FLU/RSV plus assay is intended as an aid in the diagnosis of influenza from Nasopharyngeal swab specimens and should not be used as a sole basis for treatment. Nasal washings and aspirates are unacceptable for Xpert Xpress SARS-CoV-2/FLU/RSV testing.  Fact Sheet for Patients: EntrepreneurPulse.com.au  Fact Sheet for Healthcare Providers: IncredibleEmployment.be  This test is not yet approved or cleared by the [place] FDA and has been authorized for detection and/or diagnosis of SARS-CoV-2 by FDA under an Emergency Use Authorization (EUA). This EUA will remain in effect (meaning this test can be used) for the duration of the COVID-19 declaration under Section 564(b)(1) of the Act, 21 U.S.C. section 360bbb-3(b)(1), unless the authorization is terminated or revoked.  Performed at [facility], [place] [address], [place], [place] [address]          Radiology Studies: DG Chest 1 View  Result Date: 11/04/2021 CLINICAL DATA:  Altered mental status. EXAM: CHEST  1 VIEW COMPARISON:  September 02, 2021 FINDINGS: Mild to moderate severity infiltrate is seen within the left lung base. There is no evidence of a pleural effusion or pneumothorax. There is moderate to marked severity enlargement of the cardiac silhouette. This is increased in size when compared to the prior study, which may be technical in nature. Moderate to marked severity calcification of the aortic arch is noted. The visualized skeletal structures are unremarkable. IMPRESSION: Moderate to marked severity cardiomegaly with mild to moderate severity left basilar infiltrate. Electronically Signed   By: Virgina Norfolk M.D.   On: 11/04/2021 20:12   CT HEAD WO CONTRAST  (5MM)  Result Date: 11/04/2021 CLINICAL DATA:  Altered level of consciousness for 2 days EXAM: CT HEAD WITHOUT CONTRAST TECHNIQUE: Contiguous axial images were obtained from the base of the skull through the vertex without intravenous contrast. COMPARISON:  09/03/2021, 09/02/2021 FINDINGS: Brain: Chronic ischemic changes are seen within the bilateral basal ganglia, right greater than left. No evidence of acute infarct or hemorrhage. Lateral ventricles and remaining midline structures are unremarkable. No acute extra-axial fluid collections. No mass effect. Vascular: No hyperdense vessel or unexpected calcification. Skull: Normal. Negative for fracture or focal lesion. Sinuses/Orbits: No acute finding. Other: None. IMPRESSION: 1. Chronic small vessel ischemic changes within the basal ganglia. No acute intracranial process. Electronically Signed   By: Randa Ngo M.D.   On: 11/04/2021 19:47   CT Angio Chest PE W and/or Wo Contrast  Result Date: 11/04/2021 CLINICAL DATA:  Hypoxia, abdominal pain EXAM: CT  ANGIOGRAPHY CHEST CT ABDOMEN AND PELVIS WITH CONTRAST TECHNIQUE: Multidetector CT imaging of the chest was performed using the standard protocol during bolus administration of intravenous contrast. Multiplanar CT image reconstructions and MIPs were obtained to evaluate the vascular anatomy. Multidetector CT imaging of the abdomen and pelvis was performed using the standard protocol during bolus administration of intravenous contrast. CONTRAST:  162mL OMNIPAQUE IOHEXOL 350 MG/ML SOLN COMPARISON:  11/04/2021, 03/18/2020 FINDINGS: CTA CHEST FINDINGS Cardiovascular: This is a technically adequate evaluation of the pulmonary vasculature. No filling defects or pulmonary emboli. There is a large pericardial effusion measuring up to 2.8 cm in thickness. There is subtle pericardial enhancement, concerning for exudative effusion. No evidence of thoracic aortic aneurysm or dissection. Moderate atherosclerosis of the  aorta and coronary vasculature. Mediastinum/Nodes: No enlarged mediastinal, hilar, or axillary lymph nodes. Thyroid gland, trachea, and esophagus demonstrate no significant findings. Lungs/Pleura: Small left pleural effusion with left lower lobe compressive atelectasis. Mild bilateral perihilar interstitial and ground-glass opacities consistent with mild edema. No pneumothorax. Central airways are patent. Musculoskeletal: No acute or destructive bony lesions. Reconstructed images demonstrate no additional findings. Review of the MIP images confirms the above findings. CT ABDOMEN and PELVIS FINDINGS Hepatobiliary: Reflux of contrast into the hepatic veins suggests cardiac dysfunction. No focal liver abnormality. Gallbladder is surgically absent. Pancreas: Unremarkable. No pancreatic ductal dilatation or surrounding inflammatory changes. Spleen: Normal in size without focal abnormality. Adrenals/Urinary Tract: Adrenal glands are unremarkable. Kidneys are normal, without renal calculi, focal lesion, or hydronephrosis. Bladder is unremarkable. Stomach/Bowel: No bowel obstruction or ileus. Normal appendix right lower quadrant. Mild wall thickening involving the distal transverse and descending colon is nonspecific given decompressed state, and underlying colitis cannot be excluded. No associated inflammatory change. Vascular/Lymphatic: Significant atherosclerosis of the aorta, with focal high-grade stenosis of the distal aorta just proximal to the bifurcation. Please correlate with any symptoms of claudication. No pathologic adenopathy within the abdomen or pelvis. Reproductive: Uterus and bilateral adnexa are unremarkable. Other: No free fluid or free gas.  No abdominal wall hernia. Musculoskeletal: No acute or destructive bony lesions. Prominent spondylosis at L5-S1. Reconstructed images demonstrate no additional findings. Review of the MIP images confirms the above findings. IMPRESSION: 1. Large pericardial effusion,  with pericardial enhancement concerning for pericarditis and exudative effusion. 2. Mild perihilar edema, small left pleural effusion, and reflux of contrast into the hepatic veins may reflect an element of constrictive pericarditis. Correlation with echocardiography is recommended. 3. No evidence of pulmonary embolus. 4. Nonspecific segmental distal colonic wall thickening. While this could be due to decompressed state, mild colitis cannot be excluded. 5. Aortic Atherosclerosis (ICD10-I70.0). High-grade stenosis at the aortic bifurcation from heavily calcified atheromatous plaque unchanged. Please correlate with any symptoms of lower extremity claudication. Electronically Signed   By: Randa Ngo M.D.   On: 11/04/2021 23:15   CT ABDOMEN PELVIS W CONTRAST  Result Date: 11/04/2021 CLINICAL DATA:  Hypoxia, abdominal pain EXAM: CT ANGIOGRAPHY CHEST CT ABDOMEN AND PELVIS WITH CONTRAST TECHNIQUE: Multidetector CT imaging of the chest was performed using the standard protocol during bolus administration of intravenous contrast. Multiplanar CT image reconstructions and MIPs were obtained to evaluate the vascular anatomy. Multidetector CT imaging of the abdomen and pelvis was performed using the standard protocol during bolus administration of intravenous contrast. CONTRAST:  146mL OMNIPAQUE IOHEXOL 350 MG/ML SOLN COMPARISON:  11/04/2021, 03/18/2020 FINDINGS: CTA CHEST FINDINGS Cardiovascular: This is a technically adequate evaluation of the pulmonary vasculature. No filling defects or pulmonary emboli. There is a large pericardial  effusion measuring up to 2.8 cm in thickness. There is subtle pericardial enhancement, concerning for exudative effusion. No evidence of thoracic aortic aneurysm or dissection. Moderate atherosclerosis of the aorta and coronary vasculature. Mediastinum/Nodes: No enlarged mediastinal, hilar, or axillary lymph nodes. Thyroid gland, trachea, and esophagus demonstrate no significant findings.  Lungs/Pleura: Small left pleural effusion with left lower lobe compressive atelectasis. Mild bilateral perihilar interstitial and ground-glass opacities consistent with mild edema. No pneumothorax. Central airways are patent. Musculoskeletal: No acute or destructive bony lesions. Reconstructed images demonstrate no additional findings. Review of the MIP images confirms the above findings. CT ABDOMEN and PELVIS FINDINGS Hepatobiliary: Reflux of contrast into the hepatic veins suggests cardiac dysfunction. No focal liver abnormality. Gallbladder is surgically absent. Pancreas: Unremarkable. No pancreatic ductal dilatation or surrounding inflammatory changes. Spleen: Normal in size without focal abnormality. Adrenals/Urinary Tract: Adrenal glands are unremarkable. Kidneys are normal, without renal calculi, focal lesion, or hydronephrosis. Bladder is unremarkable. Stomach/Bowel: No bowel obstruction or ileus. Normal appendix right lower quadrant. Mild wall thickening involving the distal transverse and descending colon is nonspecific given decompressed state, and underlying colitis cannot be excluded. No associated inflammatory change. Vascular/Lymphatic: Significant atherosclerosis of the aorta, with focal high-grade stenosis of the distal aorta just proximal to the bifurcation. Please correlate with any symptoms of claudication. No pathologic adenopathy within the abdomen or pelvis. Reproductive: Uterus and bilateral adnexa are unremarkable. Other: No free fluid or free gas.  No abdominal wall hernia. Musculoskeletal: No acute or destructive bony lesions. Prominent spondylosis at L5-S1. Reconstructed images demonstrate no additional findings. Review of the MIP images confirms the above findings. IMPRESSION: 1. Large pericardial effusion, with pericardial enhancement concerning for pericarditis and exudative effusion. 2. Mild perihilar edema, small left pleural effusion, and reflux of contrast into the hepatic veins  may reflect an element of constrictive pericarditis. Correlation with echocardiography is recommended. 3. No evidence of pulmonary embolus. 4. Nonspecific segmental distal colonic wall thickening. While this could be due to decompressed state, mild colitis cannot be excluded. 5. Aortic Atherosclerosis (ICD10-I70.0). High-grade stenosis at the aortic bifurcation from heavily calcified atheromatous plaque unchanged. Please correlate with any symptoms of lower extremity claudication. Electronically Signed   By: Randa Ngo M.D.   On: 11/04/2021 23:15        Scheduled Meds:  apixaban  5 mg Oral BID   atorvastatin  80 mg Oral QHS   colchicine  0.6 mg Oral Daily   diltiazem  120 mg Oral Daily   divalproex  1,000 mg Oral QPM   divalproex  250 mg Oral Daily   FreeStyle Libre 2 Sensor  1 Device Does not apply Q14 Days   furosemide  40 mg Intravenous Q12H   [START ON 11/06/2021] levothyroxine  200 mcg Oral QAC breakfast   mirtazapine  15 mg Oral QHS   Oyster Shell Calcium w/D  1 tablet Oral BID   [START ON 11/06/2021] pantoprazole  40 mg Oral QAC breakfast   PARoxetine  40 mg Oral Daily   QUEtiapine  100 mg Oral QHS   sodium chloride flush  3 mL Intravenous Q12H   cyanocobalamin  2,000 mcg Oral Daily   Continuous Infusions:  sodium chloride     cefTRIAXone (ROCEPHIN)  IV       LOS: 0 days    Time spent: Additional 32 minutes    Barb Merino, MD [facility] Pager [phone]

## 2021-11-05 NOTE — H&P (Signed)
History and Physical    Claudia Keller ASN:[id number] DOB: 1951/01/16 DOA: 11/04/2021  PCP[facility] [place]   Patient coming from: SNF  Chief Complaint: AMS, low blood oxygenation  HPI: Claudia Keller is a 70 y.o. female with medical history significant for HTN, COPD, DMT2, HFpEF, IBS, hypothyroidism who presents from SNF by EMS with confusion and hypoxemia.  Reportedly patient has some mild confusion but was much more altered today.  She had lab work done which showed a sodium of 146 at the facility and was found to be hypoxic in the 86 to 87% range per EMS.  Per chart she does have a history of CVA earlier this year and has some left leg weakness secondary to the stroke.  He is not able to provide any history.  There is no staff members or family with her.  There is no report of any fever, chills, vomiting, diarrhea, cough, shortness of breath.  Patient does not complain about any chest pain or abdominal pain and has no pain to palpation of rib cage or abdomen.  ED Course: In the emergency room she had a CT of her chest as well as abdomen and pelvis.  She does not have a pulmonary embolism.  She is found to have pericarditis and a large paracardial effusion on CT scan.  She has been placed on oxygen by nasal cannula.  ER physician discussed with cardiology who reviewed the CT scan and feels that it might be an overread by radiology for the size of the effusion.  Recommended admission and obtain echocardiogram in the morning.  VBG shows pH 7.437 PCO2 51.5 PO2 23 bicarb 36.  BNP 247.7.  Sodium 136 potassium 4.6 chloride 99 bicarb 28 creatinine 1.25 BUN 63 which is baseline levels glucose 164 alk phos a 70 albumin 2.4 AST 42 ALT 25 bilirubin 0.7.  CBC unremarkable.  COVID-19 negative.  UA is hazy yellow with large leukocytes mild ketones.  Patient was given Rocephin in the emergency room.  Urine cultures been sent to the lab.  Hospitalist service asked to admit for further  management  Review of Systems:  Unable to obtain accurate review of systems secondary to altered mental status  Past Medical History:  Diagnosis Date   Arthritis    COPD (chronic obstructive pulmonary disease) ([place])    Diverticulosis    Essential hypertension, benign    Hx of colonic polyps    Hypothyroidism    IBS (irritable bowel syndrome)    Irritable bowel syndrome    Ketoacidosis    Mixed hyperlipidemia    PTSD (post-traumatic stress disorder)    S/P colonoscopy Jan 2011   RMR: pancolonic diverticula, polypoid rectal mucosa with  prominent lymphoid aggregates, repeat in Jan 2016    Shortness of breath dyspnea    Stress incontinence, female    Thyroid disease    Tobacco use    Type 2 diabetes mellitus ([place])     Past Surgical History:  Procedure Laterality Date   Bilateral tubal ligation     BREAST BIOPSY Left 10/16/2014   negative   CHOLECYSTECTOMY  09/11/2012   Procedure: LAPAROSCOPIC CHOLECYSTECTOMY;  Surgeon: Jamesetta So, MD;  Location: [facility];  Service: General;  Laterality: N/A;   COLONOSCOPY  11/17/2009   Dr. Gala Romney: normal rectum, pancolonic diverticula, polyps benign, no adenomas.    COLONOSCOPY N/A 02/12/2015   Dr. Gala Romney: colonic diverticulosis, multiple colonic polyps (tubular adenomas), negative segmental biopsies. Surveillance in 2021  ESOPHAGOGASTRODUODENOSCOPY  11/23/2011   Dr. Gala Romney: Erosive reflux esophagitis; small hiatal hernia; esophagus dilated empirically   MALONEY DILATION  11/23/2011   Procedure: Venia Minks DILATION;  Surgeon: Daneil Dolin, MD;  Location: [facility];  Service: Endoscopy;  Laterality: N/A;   SKIN LESION EXCISION     on buttocks   TUBAL LIGATION      Social History  reports that she has been smoking cigarettes. She has a 86.00 pack-year smoking history. She has never used smokeless tobacco. She reports that she does not drink alcohol and does not use drugs.  No Known Allergies  Family History  Problem Relation Age of Onset    Cancer Mother        unsure what type   Heart disease Father    Heart attack Father    Schizophrenia Brother    Stroke Brother    Cancer Son        bladder   Coronary artery disease Other    Diabetes type II Other    Colon cancer Neg Hx    Breast cancer Neg Hx      Prior to Admission medications   Medication Sig Start Date End Date Taking? Authorizing Provider  albuterol (VENTOLIN HFA) 108 (90 Base) MCG/ACT inhaler Inhale 2 puffs into the lungs every 4 (four) hours as needed for wheezing or shortness of breath. 12/03/20   Manuella Ghazi, Pratik D, DO  apixaban (ELIQUIS) 5 MG TABS tablet Take 1 tablet (5 mg total) by mouth 2 (two) times daily. 09/04/21   Johnson, Clanford L, MD  atorvastatin (LIPITOR) 80 MG tablet Take 1 tablet (80 mg total) by mouth at bedtime. 09/04/21   Johnson, Clanford L, MD  Calcium Carb-Cholecalciferol (OYSTER SHELL CALCIUM W/D) 500-200 MG-UNIT TABS Take 1 tablet by mouth 2 (two) times daily. 11/10/20   [provider]  cetirizine (ZYRTEC) 10 MG tablet Take 10 mg by mouth daily. 11/10/20   [provider]  colestipol (COLESTID) 1 g tablet TAKE 2 TABLETS DAILY FOR DIARRHEA. DO NOT TAKE WITHIN 2 HOURS OF OTHER ORAL MEDICATIONS. HOLD FOR CONSTIPATION. 05/23/20   Carlis Stable, NP  Continuous Blood Gluc Sensor (FREESTYLE LIBRE 2 SENSOR) MISC 1 Device by Does not apply route every 14 (fourteen) days. 08/27/21   Renato Shin, MD  cyanocobalamin ([employer] VITAMIN B12) 2000 MCG tablet Take 1 tablet (2,000 mcg total) by mouth daily. 09/04/21   Johnson, Clanford L, MD  diltiazem (CARDIZEM CD) 120 MG 24 hr capsule Take 1 capsule (120 mg total) by mouth daily. 12/12/20 12/12/21  Kathie Dike, MD  divalproex (DEPAKOTE ER) 500 MG 24 hr tablet Take 2 tablets (1,000 mg total) by mouth every evening. 02/20/21   Norman Clay, MD  divalproex (DEPAKOTE) 250 MG DR tablet Take 1 tablet (250 mg total) by mouth daily. 02/20/21   Norman Clay, MD  furosemide (LASIX) 20 MG tablet Take 1  tablet (20 mg total) by mouth every other day. 09/07/21   Johnson, Clanford L, MD  gabapentin (NEURONTIN) 100 MG capsule Take 100 mg by mouth 3 (three) times daily.    [provider]  glucose blood (QUINTET BLOOD GLUCOSE TEST) test strip Check blood sugar 2 times daily E11.65 08/12/21   Renato Shin, MD  insulin detemir (LEVEMIR) 100 UNIT/ML injection Inject 0.3 mLs (30 Units total) into the skin every morning. 09/15/21   Renato Shin, MD  levothyroxine (SYNTHROID) 100 MCG tablet Take 2 tablets (200 mcg total) by mouth daily before breakfast. 12/12/20  Kathie Dike, MD  lisinopril (PRINIVIL,ZESTRIL) 20 MG tablet Take 20 mg by mouth daily.    [provider]  mirtazapine (REMERON) 7.5 MG tablet Take 1 tablet (7.5 mg total) by mouth at bedtime. 02/20/21   Norman Clay, MD  ondansetron (ZOFRAN) 4 MG tablet Take 1 tablet (4 mg total) by mouth every 8 (eight) hours as needed for nausea or vomiting. 10/17/18   Carlis Stable, NP  pantoprazole (PROTONIX) 40 MG tablet TAKE 1 TABLET BY MOUTH ONCE DAILY 30 MINTUES BEFORE BREAKFAST. Patient taking differently: Take 40 mg by mouth daily before breakfast. 05/23/20   Carlis Stable, NP  PARoxetine (PAXIL) 40 MG tablet Take 1 tablet (40 mg total) by mouth daily. 02/20/21   Norman Clay, MD  QUEtiapine (SEROQUEL) 100 MG tablet Take 1 tablet (100 mg total) by mouth at bedtime. 02/20/21   Norman Clay, MD  senna-docusate (SENOKOT-S) 8.6-50 MG tablet Take 1 tablet by mouth at bedtime as needed for mild constipation. 09/04/21   Murlean Iba, MD    Physical Exam: Vitals:   11/04/21 2030 11/04/21 2041 11/04/21 2130 11/04/21 2230  BP: (!) 161/77  (!) 151/84 (!) 160/84  Pulse: 95  100 99  Resp: 16  15 (!) 24  Temp:  98.5 F (36.9 C)    TempSrc:  Oral    SpO2: 98%  93% 98%    Constitutional: NAD, calm, comfortable Vitals:   11/04/21 2030 11/04/21 2041 11/04/21 2130 11/04/21 2230  BP: (!) 161/77  (!) 151/84 (!) 160/84  Pulse: 95  100 99   Resp: 16  15 (!) 24  Temp:  98.5 F (36.9 C)    TempSrc:  Oral    SpO2: 98%  93% 98%   General: WDWN, Alert and oriented to self.  Eyes: EOMI, PERRL, conjunctivae normal.  Sclera nonicteric HENT:  [place]/AT, external ears normal. Nares patent without epistasis. Mucous membranes are moist. Posterior pharynx clear of any exudate Neck: Soft, normal range of motion, supple, no masses, no thyromegaly.  Trachea midline Respiratory: clear to auscultation bilaterally, no wheezing, no crackles. Normal respiratory effort. No accessory muscle use.  Cardiovascular: Regular rate and rhythm, no murmurs / rubs / gallops. Mild lower extremity edema. 2+ pedal pulses. Abdomen: Soft, no tenderness, nondistended, no rebound or guarding. No masses palpated. Bowel sounds normoactive Musculoskeletal: FROM. No cyanosis. No joint deformity upper and lower extremities. Normal muscle tone.  Skin: Warm, dry, intact no rashes, lesions, ulcers. No induration Neurologic: Normal speech. patella DTR +1 bilaterally.  Moves all 4 extremities spontaneously.  Grip strength 4/5 bilaterally Psychiatric: Normal mood.    Labs on Admission: I have personally reviewed following labs and imaging studies  CBC: Recent Labs  Lab 11/04/21 1959 11/04/21 2006  WBC 7.5  --   NEUTROABS 5.0  --   HGB 12.6 11.6*  HCT 38.1 34.0*  MCV 95.3  --   PLT 290  --     Basic Metabolic Panel: Recent Labs  Lab 11/04/21 1959 11/04/21 2006  NA 136 139  K 4.6 4.5  CL 99  --   CO2 28  --   GLUCOSE 164*  --   BUN 63*  --   CREATININE 1.25*  --   CALCIUM 10.1  --     GFR: CrCl cannot be calculated (Unknown ideal weight.).  Liver Function Tests: Recent Labs  Lab 11/04/21 1959  AST 42*  ALT 25  ALKPHOS 70  BILITOT 0.7  PROT 6.2*  ALBUMIN 2.4*  Urine analysis:    Component Value Date/Time   COLORURINE YELLOW 11/04/2021 2113   APPEARANCEUR HAZY (A) 11/04/2021 2113   LABSPEC 1.017 11/04/2021 2113   PHURINE 5.0 11/04/2021  2113   GLUCOSEU NEGATIVE 11/04/2021 2113   HGBUR NEGATIVE 11/04/2021 2113   BILIRUBINUR NEGATIVE 11/04/2021 2113   KETONESUR 5 (A) 11/04/2021 2113   PROTEINUR 30 (A) 11/04/2021 2113   UROBILINOGEN 1.0 01/10/2015 1754   NITRITE NEGATIVE 11/04/2021 2113   LEUKOCYTESUR LARGE (A) 11/04/2021 2113    Radiological Exams on Admission: DG Chest 1 View  Result Date: 11/04/2021 CLINICAL DATA:  Altered mental status. EXAM: CHEST  1 VIEW COMPARISON:  September 02, 2021 FINDINGS: Mild to moderate severity infiltrate is seen within the left lung base. There is no evidence of a pleural effusion or pneumothorax. There is moderate to marked severity enlargement of the cardiac silhouette. This is increased in size when compared to the prior study, which may be technical in nature. Moderate to marked severity calcification of the aortic arch is noted. The visualized skeletal structures are unremarkable. IMPRESSION: Moderate to marked severity cardiomegaly with mild to moderate severity left basilar infiltrate. Electronically Signed   By: Virgina Norfolk M.D.   On: 11/04/2021 20:12   CT HEAD WO CONTRAST (5MM)  Result Date: 11/04/2021 CLINICAL DATA:  Altered level of consciousness for 2 days EXAM: CT HEAD WITHOUT CONTRAST TECHNIQUE: Contiguous axial images were obtained from the base of the skull through the vertex without intravenous contrast. COMPARISON:  09/03/2021, 09/02/2021 FINDINGS: Brain: Chronic ischemic changes are seen within the bilateral basal ganglia, right greater than left. No evidence of acute infarct or hemorrhage. Lateral ventricles and remaining midline structures are unremarkable. No acute extra-axial fluid collections. No mass effect. Vascular: No hyperdense vessel or unexpected calcification. Skull: Normal. Negative for fracture or focal lesion. Sinuses/Orbits: No acute finding. Other: None. IMPRESSION: 1. Chronic small vessel ischemic changes within the basal ganglia. No acute intracranial  process. Electronically Signed   By: Randa Ngo M.D.   On: 11/04/2021 19:47   CT Angio Chest PE W and/or Wo Contrast  Result Date: 11/04/2021 CLINICAL DATA:  Hypoxia, abdominal pain EXAM: CT ANGIOGRAPHY CHEST CT ABDOMEN AND PELVIS WITH CONTRAST TECHNIQUE: Multidetector CT imaging of the chest was performed using the standard protocol during bolus administration of intravenous contrast. Multiplanar CT image reconstructions and MIPs were obtained to evaluate the vascular anatomy. Multidetector CT imaging of the abdomen and pelvis was performed using the standard protocol during bolus administration of intravenous contrast. CONTRAST:  181m OMNIPAQUE IOHEXOL 350 MG/ML SOLN COMPARISON:  11/04/2021, 03/18/2020 FINDINGS: CTA CHEST FINDINGS Cardiovascular: This is a technically adequate evaluation of the pulmonary vasculature. No filling defects or pulmonary emboli. There is a large pericardial effusion measuring up to 2.8 cm in thickness. There is subtle pericardial enhancement, concerning for exudative effusion. No evidence of thoracic aortic aneurysm or dissection. Moderate atherosclerosis of the aorta and coronary vasculature. Mediastinum/Nodes: No enlarged mediastinal, hilar, or axillary lymph nodes. Thyroid gland, trachea, and esophagus demonstrate no significant findings. Lungs/Pleura: Small left pleural effusion with left lower lobe compressive atelectasis. Mild bilateral perihilar interstitial and ground-glass opacities consistent with mild edema. No pneumothorax. Central airways are patent. Musculoskeletal: No acute or destructive bony lesions. Reconstructed images demonstrate no additional findings. Review of the MIP images confirms the above findings. CT ABDOMEN and PELVIS FINDINGS Hepatobiliary: Reflux of contrast into the hepatic veins suggests cardiac dysfunction. No focal liver abnormality. Gallbladder is surgically absent. Pancreas: Unremarkable. No pancreatic ductal  dilatation or surrounding  inflammatory changes. Spleen: Normal in size without focal abnormality. Adrenals/Urinary Tract: Adrenal glands are unremarkable. Kidneys are normal, without renal calculi, focal lesion, or hydronephrosis. Bladder is unremarkable. Stomach/Bowel: No bowel obstruction or ileus. Normal appendix right lower quadrant. Mild wall thickening involving the distal transverse and descending colon is nonspecific given decompressed state, and underlying colitis cannot be excluded. No associated inflammatory change. Vascular/Lymphatic: Significant atherosclerosis of the aorta, with focal high-grade stenosis of the distal aorta just proximal to the bifurcation. Please correlate with any symptoms of claudication. No pathologic adenopathy within the abdomen or pelvis. Reproductive: Uterus and bilateral adnexa are unremarkable. Other: No free fluid or free gas.  No abdominal wall hernia. Musculoskeletal: No acute or destructive bony lesions. Prominent spondylosis at L5-S1. Reconstructed images demonstrate no additional findings. Review of the MIP images confirms the above findings. IMPRESSION: 1. Large pericardial effusion, with pericardial enhancement concerning for pericarditis and exudative effusion. 2. Mild perihilar edema, small left pleural effusion, and reflux of contrast into the hepatic veins may reflect an element of constrictive pericarditis. Correlation with echocardiography is recommended. 3. No evidence of pulmonary embolus. 4. Nonspecific segmental distal colonic wall thickening. While this could be due to decompressed state, mild colitis cannot be excluded. 5. Aortic Atherosclerosis (ICD10-I70.0). High-grade stenosis at the aortic bifurcation from heavily calcified atheromatous plaque unchanged. Please correlate with any symptoms of lower extremity claudication. Electronically Signed   By: Randa Ngo M.D.   On: 11/04/2021 23:15   CT ABDOMEN PELVIS W CONTRAST  Result Date: 11/04/2021 CLINICAL DATA:  Hypoxia,  abdominal pain EXAM: CT ANGIOGRAPHY CHEST CT ABDOMEN AND PELVIS WITH CONTRAST TECHNIQUE: Multidetector CT imaging of the chest was performed using the standard protocol during bolus administration of intravenous contrast. Multiplanar CT image reconstructions and MIPs were obtained to evaluate the vascular anatomy. Multidetector CT imaging of the abdomen and pelvis was performed using the standard protocol during bolus administration of intravenous contrast. CONTRAST:  173m OMNIPAQUE IOHEXOL 350 MG/ML SOLN COMPARISON:  11/04/2021, 03/18/2020 FINDINGS: CTA CHEST FINDINGS Cardiovascular: This is a technically adequate evaluation of the pulmonary vasculature. No filling defects or pulmonary emboli. There is a large pericardial effusion measuring up to 2.8 cm in thickness. There is subtle pericardial enhancement, concerning for exudative effusion. No evidence of thoracic aortic aneurysm or dissection. Moderate atherosclerosis of the aorta and coronary vasculature. Mediastinum/Nodes: No enlarged mediastinal, hilar, or axillary lymph nodes. Thyroid gland, trachea, and esophagus demonstrate no significant findings. Lungs/Pleura: Small left pleural effusion with left lower lobe compressive atelectasis. Mild bilateral perihilar interstitial and ground-glass opacities consistent with mild edema. No pneumothorax. Central airways are patent. Musculoskeletal: No acute or destructive bony lesions. Reconstructed images demonstrate no additional findings. Review of the MIP images confirms the above findings. CT ABDOMEN and PELVIS FINDINGS Hepatobiliary: Reflux of contrast into the hepatic veins suggests cardiac dysfunction. No focal liver abnormality. Gallbladder is surgically absent. Pancreas: Unremarkable. No pancreatic ductal dilatation or surrounding inflammatory changes. Spleen: Normal in size without focal abnormality. Adrenals/Urinary Tract: Adrenal glands are unremarkable. Kidneys are normal, without renal calculi, focal  lesion, or hydronephrosis. Bladder is unremarkable. Stomach/Bowel: No bowel obstruction or ileus. Normal appendix right lower quadrant. Mild wall thickening involving the distal transverse and descending colon is nonspecific given decompressed state, and underlying colitis cannot be excluded. No associated inflammatory change. Vascular/Lymphatic: Significant atherosclerosis of the aorta, with focal high-grade stenosis of the distal aorta just proximal to the bifurcation. Please correlate with any symptoms of claudication. No pathologic adenopathy within  the abdomen or pelvis. Reproductive: Uterus and bilateral adnexa are unremarkable. Other: No free fluid or free gas.  No abdominal wall hernia. Musculoskeletal: No acute or destructive bony lesions. Prominent spondylosis at L5-S1. Reconstructed images demonstrate no additional findings. Review of the MIP images confirms the above findings. IMPRESSION: 1. Large pericardial effusion, with pericardial enhancement concerning for pericarditis and exudative effusion. 2. Mild perihilar edema, small left pleural effusion, and reflux of contrast into the hepatic veins may reflect an element of constrictive pericarditis. Correlation with echocardiography is recommended. 3. No evidence of pulmonary embolus. 4. Nonspecific segmental distal colonic wall thickening. While this could be due to decompressed state, mild colitis cannot be excluded. 5. Aortic Atherosclerosis (ICD10-I70.0). High-grade stenosis at the aortic bifurcation from heavily calcified atheromatous plaque unchanged. Please correlate with any symptoms of lower extremity claudication. Electronically Signed   By: Randa Ngo M.D.   On: 11/04/2021 23:15    EKG: Independently reviewed.  EKG shows normal sinus rhythm with nonspecific ST changes.  No acute ST elevation or depression consistent with STEMI. QTc 546  Assessment/Plan Principal Problem:   (HFpEF) heart failure with preserved ejection fraction, acute  on chronic Claudia Keller is admitted to Cardiac telemetry floor.  Cardiology has been consulted with pericarditis and large pericardial effusion on CT chest. Cardiology felt effusion is not as large as CT suggests and will obtain Echo in am to better evaluate  Check serial troponin levels.  Diuresis with lasix. Monitor I&Os, daily weights.  Pharmacy will verify and reconcile home meds and will resume as indicated.  Active Problems:   Pericarditis Placed on ibuprofen and colchicine. Awaiting cardiology recommendations Check CRP.    Hypoxemia Supplemental oxygen as needed to keep O2 sat 92-96% Flutter valve every two hours while awake.    UTI (urinary tract infection) Started on Rocephin in the ER. Continue. Urine culture sent and will be monitored    CKD (chronic kidney disease) stage 3, GFR 30-59 ml/min Stable. Monitor with labs in am    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 Home meds will be verified then resumed.    Essential hypertension, benign Continue home meds when verified. Monitor BP    COPD (chronic obstructive pulmonary disease)  Stable    Prolonged QT interval Avoid medications which could further prolong QT interval    AMS (altered mental status) May Be secondary to infection. Placed on fall precautions.    DVT prophylaxis: On Eliquis which is continued  Code Status:   Full Code  Family Communication:  No family present Disposition Plan:   Patient is from:  SNF  Anticipated DC to:  SNF  Anticipated DC date:  Anticipate 2 midnight or more stay to treat acute condition  Consults called:  Cardiology consulted by ER physician and will see in am  Admission status:  Inpatient  Yevonne Aline Aleicia Kenagy MD [facility]  How to contact the [facility] Attending or Consulting provider [place] or covering provider during after hours [place], for this patient?   Check the care team in [facility] and look for a) attending/consulting [facility] provider listed and b) the [facility] team  listed Log into [email or web address] and use [place]'s universal password to access. If you do not have the password, please contact the hospital operator. Locate the [facility] provider you are looking for under [facility] and page to a number that you can be directly reached. If you still have difficulty reaching the provider, please page the [facility] (Director  on Call) for the Hospitalists listed on amion for assistance.  11/05/2021, 12:44 AM

## 2021-11-05 NOTE — Progress Notes (Signed)
°  Echocardiogram 2D Echocardiogram has been performed.  Claudia Keller F 11/05/2021, 11:14 AM

## 2021-11-05 NOTE — ED Notes (Signed)
Patient transported to MRI 

## 2021-11-05 NOTE — ED Notes (Signed)
Pleasant, ate most of lunch with minimal assist. Drinks and swallows without difficulty. Follows directions well, but is confused,  talking about her baby that she will "love when he is here " and talking to grandmother.

## 2021-11-05 NOTE — Evaluation (Signed)
Physical Therapy Evaluation Patient Details Name: Claudia Keller MRN: [id number] DOB: 1951-07-09 Today's Date: 11/05/2021  History of Present Illness  This 70 y.o. female admitted from SNF with confusion and hypoxemia.  Dx; pericarditis and large pericardial effusion on CT of chest, UTI .   PMH includes:  COPD, essential HTN, DM, CKD, CVA, hypothyroidism, IBS, ketoacidosis  Clinical Impression  Pt admitted secondary to problem above with deficits below. Pt requiring max A +2 for bed mobility and transfers. Increased confusion throughout. Pt from SNF per notes. Anticipate pt is close to her baseline functionally, so recommend return to SNF at d/c. No further acute PT needs. Will sign off. If needs change, please re-consult.      Recommendations for follow up therapy are one component of a multi-disciplinary discharge planning process, led by the attending physician.  Recommendations may be updated based on patient status, additional functional criteria and insurance authorization.  Follow Up Recommendations Long-term institutional care without follow-up therapy    Assistance Recommended at Discharge Frequent or constant Supervision/Assistance  Functional Status Assessment Patient has had a recent decline in their functional status and demonstrates the ability to make significant improvements in function in a reasonable and predictable amount of time.  Equipment Recommendations  None recommended by PT    Recommendations for Other Services       Precautions / Restrictions Precautions Precautions: Fall Restrictions Weight Bearing Restrictions: No      Mobility  Bed Mobility Overal bed mobility: Needs Assistance Bed Mobility: Supine to Sit;Sit to Supine     Supine to sit: Max assist;+2 for physical assistance;+2 for safety/equipment Sit to supine: Max assist;+2 for physical assistance;+2 for safety/equipment   General bed mobility comments: assist for all aspects     Transfers Overall transfer level: Needs assistance   Transfers: Sit to/from Stand Sit to Stand: Max assist;+2 physical assistance;+2 safety/equipment           General transfer comment: Pt stands on her toes on the lt LE unable to dorsiflex to neutral, unable to extend Lt knee or hip when standing despite max assist/facilitation    Ambulation/Gait                  Stairs            Wheelchair Mobility    Modified Rankin (Stroke Patients Only)       Balance Overall balance assessment: Needs assistance Sitting-balance support: Feet supported Sitting balance-Leahy Scale: Poor Sitting balance - Comments: requires mod A Postural control: Right lateral lean Standing balance support: Single extremity supported Standing balance-Leahy Scale: Zero Standing balance comment: requires max A +2                             Pertinent Vitals/Pain Pain Assessment: No/denies pain    Home Living Family/patient expects to be discharged to:: Skilled nursing facility                   Additional Comments: Pt has been residing at [facility] and is active with [facility]    Prior Function Prior Level of Function : Needs assist;Patient poor historian/Family not available                     Hand Dominance   Dominant Hand: Left    Extremity/Trunk Assessment   Upper Extremity Assessment Upper Extremity Assessment: LUE deficits/detail LUE Deficits / Details: residual weakness from  stroke.  No active movement noted Lt UE.  PROM WFL.  Mild flexor spasticity LUE Coordination: decreased gross motor;decreased fine motor    Lower Extremity Assessment Lower Extremity Assessment: LLE deficits/detail LLE Deficits / Details: Residual weakness from prior CVA       Communication      Cognition Arousal/Alertness: Awake/alert Behavior During Therapy: WFL for tasks assessed/performed Overall Cognitive Status: No family/caregiver present to  determine baseline cognitive functioning                                 General Comments: Pt oriented to self only.  She followed one step commands consistently, but is easily distracted        General Comments General comments (skin integrity, edema, etc.): VSS    Exercises     Assessment/Plan    PT Assessment Patient does not need any further PT services;All further PT needs can be met in the next venue of care  PT Problem List Decreased strength;Decreased activity tolerance;Decreased balance;Decreased mobility;Decreased cognition;Decreased knowledge of use of DME;Decreased knowledge of precautions;Decreased safety awareness       PT Treatment Interventions      PT Goals (Current goals can be found in the Care Plan section)  Acute Rehab PT Goals PT Goal Formulation: Patient unable to participate in goal setting Time For Goal Achievement: 11/05/21 Potential to Achieve Goals: Fair    Frequency     Barriers to discharge        Co-evaluation PT/OT/SLP Co-Evaluation/Treatment: Yes Reason for Co-Treatment: For patient/therapist safety;To address functional/ADL transfers PT goals addressed during session: Mobility/safety with mobility;Balance OT goals addressed during session: ADL's and self-care       AM-PAC PT "6 Clicks" Mobility  Outcome Measure Help needed turning from your back to your side while in a flat bed without using bedrails?: Total Help needed moving from lying on your back to sitting on the side of a flat bed without using bedrails?: Total Help needed moving to and from a bed to a chair (including a wheelchair)?: Total Help needed standing up from a chair using your arms (e.g., wheelchair or bedside chair)?: Total Help needed to walk in hospital room?: Total Help needed climbing 3-5 steps with a railing? : Total 6 Click Score: 6    End of Session   Activity Tolerance: Patient tolerated treatment well Patient left: in bed;with call  bell/phone within reach (on stretcher in ED) Nurse Communication: Mobility status PT Visit Diagnosis: Other symptoms and signs involving the nervous system (R29.898)    Time: 1959-7471 PT Time Calculation (min) (ACUTE ONLY): 12 min   Charges:   PT Evaluation $PT Eval Moderate Complexity: 1 Mod          Lou Miner, DPT  Acute Rehabilitation Services  Pager: [phone] Office: [phone]   Rudean Hitt 11/05/2021, 4:43 PM

## 2021-11-05 NOTE — Evaluation (Signed)
Occupational Therapy Evaluation Patient Details Name: Claudia Keller MRN: [id number] DOB: 1951-01-24 Today's Date: 11/05/2021   History of Present Illness This 70 y.o. female admitted from SNF with confusion and hypoxemia.  Dx; pericarditis and large pericardial effusion on CT of chest, UTI .   PMH includes:  COPD, essential HTN, DM, CKD, CVA, hypothyroidism, IBS, ketoacidosis   Clinical Impression   Pt presents to OT with the above diagnosis and demonstrates the below listed deficits.  She currently requires mod - total A for all aspects of ADLs and max A +2 for functional mobility. She has significant cognitive impairment.  She was admitted from SNF.  Anticipate pt is close to her baseline.  All further OT needs can be addressed at SNF.   Acute OT will sign off.  Recommend nursing transfer pt with hoyer lift.        Recommendations for follow up therapy are one component of a multi-disciplinary discharge planning process, led by the attending physician.  Recommendations may be updated based on patient status, additional functional criteria and insurance authorization.   Follow Up Recommendations  Skilled nursing-short term rehab (<3 hours/day)    Assistance Recommended at Discharge Frequent or constant Supervision/Assistance  Functional Status Assessment  Patient has had a recent decline in their functional status and/or demonstrates limited ability to make significant improvements in function in a reasonable and predictable amount of time  Equipment Recommendations  None recommended by OT    Recommendations for Other Services       Precautions / Restrictions Precautions Precautions: Fall      Mobility Bed Mobility Overal bed mobility: Needs Assistance Bed Mobility: Supine to Sit;Sit to Supine     Supine to sit: Max assist;+2 for physical assistance;+2 for safety/equipment Sit to supine: Max assist;+2 for physical assistance;+2 for safety/equipment   General bed mobility  comments: assist for all aspects    Transfers Overall transfer level: Needs assistance   Transfers: Sit to/from Stand Sit to Stand: Max assist;+2 physical assistance;+2 safety/equipment           General transfer comment: Pt stands on her toes on the lt LE unable to dorsiflex to neutral, unable to extend Lt knee or hip when standing despite max assist/facilitation      Balance Overall balance assessment: Needs assistance Sitting-balance support: Feet supported Sitting balance-Leahy Scale: Poor Sitting balance - Comments: requires mod A Postural control: Right lateral lean Standing balance support: Single extremity supported Standing balance-Leahy Scale: Zero Standing balance comment: requires max A +2                           ADL either performed or assessed with clinical judgement   ADL Overall ADL's : Needs assistance/impaired Eating/Feeding: Minimal assistance;Bed level   Grooming: Wash/dry hands;Wash/dry face;Oral care;Brushing hair;Applying deodorant;Moderate assistance;Sitting;Bed level   Upper Body Bathing: Moderate assistance;Sitting   Lower Body Bathing: Maximal assistance;Sit to/from stand   Upper Body Dressing : Maximal assistance;Sitting   Lower Body Dressing: Maximal assistance;Sit to/from stand   Toilet Transfer: Maximal assistance;+2 for physical assistance;Squat-pivot;BSC/3in1 Toilet Transfer Details (indicate cue type and reason): Pt does not extend Lt knee of hip.  Stands on her toes on the Lt Toileting- Water quality scientist and Hygiene: Total assistance;Bed level       Functional mobility during ADLs: Maximal assistance       Vision   Additional Comments: pt unable to participate in formal assessment     Perception  Praxis      Pertinent Vitals/Pain Pain Assessment: No/denies pain     Hand Dominance Left   Extremity/Trunk Assessment Upper Extremity Assessment Upper Extremity Assessment: LUE deficits/detail LUE  Deficits / Details: residual weakness from stroke.  No active movement noted Lt UE.  PROM WFL.  Mild flexor spasticity LUE Coordination: decreased gross motor;decreased fine motor   Lower Extremity Assessment Lower Extremity Assessment: Defer to PT evaluation       Communication     Cognition Arousal/Alertness: Awake/alert Behavior During Therapy: WFL for tasks assessed/performed Overall Cognitive Status: No family/caregiver present to determine baseline cognitive functioning                                 General Comments: Pt oriented to self only.  She followed one step commands consistently, but is easily distracted     General Comments  VSS with pt on RA    Exercises     Shoulder Instructions      Home Living Family/patient expects to be discharged to:: Skilled nursing facility                                 Additional Comments: Pt has been residing at [facility] and is active with [facility]      Prior Functioning/Environment Prior Level of Function : Needs assist;Patient poor historian/Family not available                        OT Problem List: Decreased strength;Decreased range of motion;Decreased activity tolerance;Impaired balance (sitting and/or standing);Decreased coordination;Decreased cognition;Decreased safety awareness;Decreased knowledge of use of DME or AE;Impaired tone;Impaired UE functional use      OT Treatment/Interventions:      OT Goals(Current goals can be found in the care plan section) Acute Rehab OT Goals OT Goal Formulation: Patient unable to participate in goal setting  OT Frequency:     Barriers to D/C:            Co-evaluation PT/OT/SLP Co-Evaluation/Treatment: Yes     OT goals addressed during session: ADL's and self-care      AM-PAC OT "6 Clicks" Daily Activity     Outcome Measure Help from another person eating meals?: A Little Help from another person taking care of personal  grooming?: A Lot Help from another person toileting, which includes using toliet, bedpan, or urinal?: Total Help from another person bathing (including washing, rinsing, drying)?: A Lot Help from another person to put on and taking off regular upper body clothing?: A Lot Help from another person to put on and taking off regular lower body clothing?: A Lot 6 Click Score: 12   End of Session Nurse Communication: Mobility status  Activity Tolerance: Patient tolerated treatment well Patient left: in bed;with call bell/phone within reach  OT Visit Diagnosis: Unsteadiness on feet (R26.81);Hemiplegia and hemiparesis Hemiplegia - Right/Left: Left Hemiplegia - dominant/non-dominant: Dominant Hemiplegia - caused by: Cerebral infarction                Time: 7169-6789 OT Time Calculation (min): 10 min Charges:  OT General Charges $OT Visit: 1 Visit OT Evaluation $OT Eval Moderate Complexity: 1 Mod  Nilsa Nutting., OTR/L Acute Rehabilitation Services Pager [phone] Office [phone]   Lucille Passy M 11/05/2021, 3:14 PM

## 2021-11-06 ENCOUNTER — Inpatient Hospital Stay (HOSPITAL_COMMUNITY): Payer: Medicare (Managed Care)

## 2021-11-06 ENCOUNTER — Encounter (HOSPITAL_COMMUNITY)
Admission: EM | Disposition: A | Discharge status: Skilled Nursing Facility | Payer: Self-pay | Source: Skilled Nursing Facility | Attending: Internal Medicine

## 2021-11-06 DIAGNOSIS — I3139 Other pericardial effusion (noninflammatory): Secondary | ICD-10-CM

## 2021-11-06 DIAGNOSIS — J449 Chronic obstructive pulmonary disease, unspecified: Secondary | ICD-10-CM | POA: Diagnosis not present

## 2021-11-06 DIAGNOSIS — I5033 Acute on chronic diastolic (congestive) heart failure: Secondary | ICD-10-CM

## 2021-11-06 DIAGNOSIS — Z515 Encounter for palliative care: Secondary | ICD-10-CM

## 2021-11-06 DIAGNOSIS — Z8673 Personal history of transient ischemic attack (TIA), and cerebral infarction without residual deficits: Secondary | ICD-10-CM

## 2021-11-06 DIAGNOSIS — I319 Disease of pericardium, unspecified: Secondary | ICD-10-CM | POA: Diagnosis not present

## 2021-11-06 DIAGNOSIS — Z7189 Other specified counseling: Secondary | ICD-10-CM

## 2021-11-06 HISTORY — PX: PERICARDIOCENTESIS: CATH118255

## 2021-11-06 LAB — GLUCOSE, CAPILLARY
Glucose-Capillary: 183 mg/dL — ABNORMAL HIGH (ref 70–99)
Glucose-Capillary: 146 mg/dL — ABNORMAL HIGH (ref 70–99)
Glucose-Capillary: 165 mg/dL — ABNORMAL HIGH (ref 70–99)
Glucose-Capillary: 234 mg/dL — ABNORMAL HIGH (ref 70–99)

## 2021-11-06 LAB — BASIC METABOLIC PANEL
Anion gap: 8 (ref 5–15)
BUN: 44 mg/dL — ABNORMAL HIGH (ref 8–23)
CO2: 31 mmol/L (ref 22–32)
Calcium: 10.3 mg/dL (ref 8.9–10.3)
Chloride: 97 mmol/L — ABNORMAL LOW (ref 98–111)
Creatinine, Ser: 0.99 mg/dL (ref 0.44–1.00)
GFR, Estimated: >60 mL/min (ref >60)
Glucose, Bld: 172 mg/dL — ABNORMAL HIGH (ref 70–99)
Potassium: 4.1 mmol/L (ref 3.5–5.1)
Sodium: 136 mmol/L (ref 135–145)

## 2021-11-06 LAB — CBC WITH DIFFERENTIAL/PLATELET
Abs Immature Granulocytes: 0.05 10*3/uL (ref 0.00–0.07)
Basophils Absolute: 0 10*3/uL (ref 0.0–0.1)
Basophils Relative: 0 %
Eosinophils Absolute: 0.1 10*3/uL (ref 0.0–0.5)
Eosinophils Relative: 1 %
HCT: 33.3 % — ABNORMAL LOW (ref 36.0–46.0)
Hemoglobin: 11.3 g/dL — ABNORMAL LOW (ref 12.0–15.0)
Immature Granulocytes: 1 %
Lymphocytes Relative: 33 %
Lymphs Abs: 2.3 10*3/uL (ref 0.7–4.0)
MCH: 31.5 pg (ref 26.0–34.0)
MCHC: 33.9 g/dL (ref 30.0–36.0)
MCV: 92.8 fL (ref 80.0–100.0)
Monocytes Absolute: 1 10*3/uL (ref 0.1–1.0)
Monocytes Relative: 14 %
Neutro Abs: 3.6 10*3/uL (ref 1.7–7.7)
Neutrophils Relative %: 51 %
Platelets: 264 10*3/uL (ref 150–400)
RBC: 3.59 MIL/uL — ABNORMAL LOW (ref 3.87–5.11)
RDW: 14.8 % (ref 11.5–15.5)
WBC: 7.1 10*3/uL (ref 4.0–10.5)
nRBC: 0 % (ref 0.0–0.2)

## 2021-11-06 LAB — ECHOCARDIOGRAM LIMITED
Height: 63.5 in
Weight: 2296.31 oz

## 2021-11-06 LAB — BODY FLUID CELL COUNT WITH DIFFERENTIAL
Eos, Fluid: 1 %
Lymphs, Fluid: 57 %
Monocyte-Macrophage-Serous Fluid: 6 % — ABNORMAL LOW (ref 50–90)
Neutrophil Count, Fluid: 36 % — ABNORMAL HIGH (ref 0–25)
Total Nucleated Cell Count, Fluid: 281 cu mm (ref 0–1000)

## 2021-11-06 LAB — MAGNESIUM: Magnesium: 1.7 mg/dL (ref 1.7–2.4)

## 2021-11-06 LAB — PHOSPHORUS: Phosphorus: 2.6 mg/dL (ref 2.5–4.6)

## 2021-11-06 LAB — MRSA NEXT GEN BY PCR, NASAL: MRSA by PCR Next Gen: NOT DETECTED

## 2021-11-06 SURGERY — PERICARDIOCENTESIS
Anesthesia: LOCAL

## 2021-11-06 MED ORDER — HEPARIN (PORCINE) IN NACL 2000-0.9 UNIT/L-% IV SOLN
INTRAVENOUS | Status: DC | PRN
  Administered 2021-11-06: 1000 mL

## 2021-11-06 MED ORDER — MIDAZOLAM HCL 2 MG/2ML IJ SOLN
INTRAMUSCULAR | Status: AC
  Filled 2021-11-06: qty 2

## 2021-11-06 MED ORDER — FENTANYL CITRATE (PF) 100 MCG/2ML IJ SOLN
INTRAMUSCULAR | Status: DC | PRN
  Administered 2021-11-06: 25 ug via INTRAVENOUS

## 2021-11-06 MED ORDER — PNEUMOCOCCAL VAC POLYVALENT 25 MCG/0.5ML IJ INJ
0.5000 mL | INJECTION | INTRAMUSCULAR | Status: DC
  Filled 2021-11-06: qty 0.5

## 2021-11-06 MED ORDER — MUPIROCIN 2 % EX OINT
1.0000 "application " | TOPICAL_OINTMENT | Freq: Two times a day (BID) | CUTANEOUS | Status: DC
  Administered 2021-11-06 – 2021-11-09 (×7): 1 via NASAL
  Filled 2021-11-06 (×3): qty 22

## 2021-11-06 MED ORDER — MIDAZOLAM HCL 2 MG/2ML IJ SOLN
INTRAMUSCULAR | Status: DC | PRN
  Administered 2021-11-06: 1 mg via INTRAVENOUS

## 2021-11-06 MED ORDER — LIDOCAINE HCL (PF) 1 % IJ SOLN
INTRAMUSCULAR | Status: DC | PRN
  Administered 2021-11-06: 15 mL

## 2021-11-06 MED ORDER — CHLORHEXIDINE GLUCONATE CLOTH 2 % EX PADS
6.0000 | MEDICATED_PAD | Freq: Every day | CUTANEOUS | Status: DC
  Administered 2021-11-06 – 2021-11-10 (×5): 6 via TOPICAL

## 2021-11-06 MED ORDER — FENTANYL CITRATE (PF) 100 MCG/2ML IJ SOLN
INTRAMUSCULAR | Status: AC
  Filled 2021-11-06: qty 2

## 2021-11-06 MED ORDER — LIDOCAINE HCL (PF) 1 % IJ SOLN
INTRAMUSCULAR | Status: AC
  Filled 2021-11-06: qty 30

## 2021-11-06 SURGICAL SUPPLY — 7 items
ELECT DEFIB PAD ADLT CADENCE (PAD) ×1 IMPLANT
PACK CARDIAC CATHETERIZATION (CUSTOM PROCEDURE TRAY) ×1 IMPLANT
TRANSDUCER W/STOPCOCK (MISCELLANEOUS) ×1 IMPLANT
TRAY PERICARDIOCENTESIS 6FX60 (TRAY / TRAY PROCEDURE) ×1 IMPLANT
TUBING ART PRESS 72  MALE/FEM (TUBING) ×2
TUBING ART PRESS 72 MALE/FEM (TUBING) IMPLANT
WIRE EMERALD 3MM-J .035X150CM (WIRE) ×1 IMPLANT

## 2021-11-06 NOTE — Consult Note (Addendum)
Cardiology Consultation:   Patient ID: Claudia Keller MRN: [id number]; DOB: 1951-08-14  Admit date: 11/04/2021 Date of Consult: 11/06/2021  PCP:  [facility] Cardiologist:  None      Dr Domenic Polite saw in 2012  Patient Profile:   Claudia Keller is a 70 y.o. female with a hx of PAF on Eliquis, hypothyroid, COPD, DM2, HTN, IBS, HLD, OA, who is being seen 11/06/2021 for the evaluation of pericardial effusion, at the request of Dr Sloan Leiter.  History of Present Illness:   [name] was last seen by Dr Domenic Polite in 2012 and had a nonischemic MV with an EF of greater than 65%.  Admitted 1/24 - 12/03/2020 with COVID, CBGs greater than 400, was treated for acute hypoxemic respiratory failure.  Admitted 1/30-12/12/2020 for acute hypoxemic respiratory failure secondary to COVID-pneumonia.  She was discharged on 2 L of oxygen.  She had transient atrial fibrillation that was treated with diltiazem and converted to sinus rhythm.  No additional atrial fibrillation was seen during that admission.  The discharge summary states that if she had any recurrence, would need to consider anticoagulation.  She was admitted 10/26-10/28/2022 with a CVA, discharged to SNF.  Initially, a TEE and monitor recommended, but since she had been previously diagnosed with atrial fibrillation, she was just started on anticoagulation.  Pericardial effusion seen on echo.  She came to the ER 12/28 with confusion and hypoxemia.  Her O2 sats were 86-87% on room air.  Sodium was 146 at the facility.  CT negative for PE but showed a pericardial effusion.  Sodium here 136, BUN 63, creatinine 1.25, COVID-negative, UA abnormal and culture pending.  Pericardial effusion is increased in size and is now large, Cards asked to see.  [name] has been getting gradually weaker and weaker.  P.o. intake has been decreasing.  2 months ago, she weighed about 70 kg.  Now, she weighs 65  kg.  She has had no acute illness or injury.  She denies presyncope or presyncope.  No palpitations, if she has had more atrial fibrillation, she is not aware of it.  She was sent to the hospital because of decreased p.o. intake, altered mental status, and problems with speech.  She is oriented to name and place.  She is able to answer questions with one-word answers, but does not elaborate.  There are no records available to review, but according to the family, her activity level is greatly decreased.  Palliative care has been called to see her and and their NP is in the room now.  Currently, she is a full code.   Past Medical History:  Diagnosis Date   Arthritis    COPD (chronic obstructive pulmonary disease) ([employer])    Diverticulosis    Essential hypertension, benign    Hx of colonic polyps    Hypothyroidism    IBS (irritable bowel syndrome)    Irritable bowel syndrome    Ketoacidosis    Mixed hyperlipidemia    PTSD (post-traumatic stress disorder)    S/P colonoscopy Jan 2011   RMR: pancolonic diverticula, polypoid rectal mucosa with  prominent lymphoid aggregates, repeat in Jan 2016    Shortness of breath dyspnea    Stress incontinence, female    Thyroid disease    Tobacco use    Type 2 diabetes mellitus ([place])     Past Surgical History:  Procedure Laterality Date   Bilateral tubal ligation  BREAST BIOPSY Left 10/16/2014   negative   CHOLECYSTECTOMY  09/11/2012   Procedure: LAPAROSCOPIC CHOLECYSTECTOMY;  Surgeon: Jamesetta So, MD;  Location: [facility];  Service: General;  Laterality: N/A;   COLONOSCOPY  11/17/2009   Dr. Gala Romney: normal rectum, pancolonic diverticula, polyps benign, no adenomas.    COLONOSCOPY N/A 02/12/2015   Dr. Gala Romney: colonic diverticulosis, multiple colonic polyps (tubular adenomas), negative segmental biopsies. Surveillance in 2021   ESOPHAGOGASTRODUODENOSCOPY  11/23/2011   Dr. Gala Romney: Erosive reflux esophagitis; small hiatal hernia; esophagus dilated  empirically   MALONEY DILATION  11/23/2011   Procedure: Venia Minks DILATION;  Surgeon: Daneil Dolin, MD;  Location: [facility];  Service: Endoscopy;  Laterality: N/A;   SKIN LESION EXCISION     on buttocks   TUBAL LIGATION       Home Medications:  Prior to Admission medications   Medication Sig Start Date End Date Taking? Authorizing Provider  acetaminophen (TYLENOL) 325 MG tablet Take 650 mg by mouth every 5 (five) hours as needed for mild pain.   Yes [provider]  albuterol (VENTOLIN HFA) 108 (90 Base) MCG/ACT inhaler Inhale 2 puffs into the lungs every 4 (four) hours as needed for wheezing or shortness of breath. 12/03/20  Yes Manuella Ghazi, Pratik D, DO  apixaban (ELIQUIS) 5 MG TABS tablet Take 1 tablet (5 mg total) by mouth 2 (two) times daily. 09/04/21  Yes Johnson, Clanford L, MD  aspirin 81 MG chewable tablet Chew 81 mg by mouth daily.   Yes [provider]  atorvastatin (LIPITOR) 80 MG tablet Take 1 tablet (80 mg total) by mouth at bedtime. 09/04/21  Yes Johnson, Clanford L, MD  bisacodyl (DULCOLAX) 10 MG suppository Place 10 mg rectally daily as needed for moderate constipation or severe constipation. Use only if Milk of Magnesia does not work as needed every 24 hours.   Yes [provider]  Calcium Carb-Cholecalciferol (OYSTER SHELL CALCIUM W/D) 500-200 MG-UNIT TABS Take 1 tablet by mouth 2 (two) times daily. 11/10/20  Yes [provider]  cetirizine (ZYRTEC) 10 MG tablet Take 10 mg by mouth daily.   Yes [provider]  colestipol (COLESTID) 1 g tablet TAKE 2 TABLETS DAILY FOR DIARRHEA. DO NOT TAKE WITHIN 2 HOURS OF OTHER ORAL MEDICATIONS. HOLD FOR CONSTIPATION. Patient taking differently: Take 2 g by mouth daily. TAKE 2 TABLETS DAILY FOR DIARRHEA. DO NOT TAKE WITHIN 2 HOURS OF OTHER ORAL MEDICATIONS. HOLD FOR CONSTIPATION. 05/23/20  Yes Carlis Stable, NP  Continuous Blood Gluc Sensor (FREESTYLE LIBRE 2 SENSOR) MISC 1 Device by Does not apply route  every 14 (fourteen) days. 08/27/21  Yes Renato Shin, MD  cyanocobalamin ([employer] VITAMIN B12) 2000 MCG tablet Take 1 tablet (2,000 mcg total) by mouth daily. 09/04/21  Yes Johnson, Clanford L, MD  diltiazem (CARDIZEM CD) 120 MG 24 hr capsule Take 1 capsule (120 mg total) by mouth daily. 12/12/20 12/12/21 Yes Kathie Dike, MD  divalproex (DEPAKOTE ER) 500 MG 24 hr tablet Take 2 tablets (1,000 mg total) by mouth every evening. 02/20/21  Yes Norman Clay, MD  divalproex (DEPAKOTE) 250 MG DR tablet Take 1 tablet (250 mg total) by mouth daily. 02/20/21  Yes Hisada, Elie Goody, MD  furosemide (LASIX) 20 MG tablet Take 1 tablet (20 mg total) by mouth every other day. 09/07/21  Yes Johnson, Clanford L, MD  glucose blood (QUINTET BLOOD GLUCOSE TEST) test strip Check blood sugar 2 times daily E11.65 08/12/21  Yes Renato Shin, MD  insulin detemir (  LEVEMIR) 100 UNIT/ML injection Inject 0.3 mLs (30 Units total) into the skin every morning. Patient taking differently: Inject 30 Units into the skin daily. 09/15/21  Yes Renato Shin, MD  levothyroxine (SYNTHROID) 200 MCG tablet Take 200 mcg by mouth daily before breakfast.   Yes [provider]  lisinopril (ZESTRIL) 40 MG tablet Take 40 mg by mouth daily.   Yes [provider]  magnesium hydroxide (MILK OF MAGNESIA) 400 MG/5ML suspension Take 30 mLs by mouth daily as needed for mild constipation or moderate constipation. If no BM in 3 days, take 30cc every 24 hours PRN   Yes [provider]  mirtazapine (REMERON) 15 MG tablet Take 15 mg by mouth at bedtime.   Yes [provider]  nicotine polacrilex (NICORETTE) 4 MG gum Take 4 mg by mouth every 2 (two) hours as needed for smoking cessation. **avoid food/drink for 15 minutes before and after**   Yes [provider]  ondansetron (ZOFRAN) 4 MG tablet Take 1 tablet (4 mg total) by mouth every 8 (eight) hours as needed for nausea or vomiting. 10/17/18  Yes Walden Field A, NP   pantoprazole (PROTONIX) 40 MG tablet TAKE 1 TABLET BY MOUTH ONCE DAILY 30 MINTUES BEFORE BREAKFAST. Patient taking differently: Take 40 mg by mouth daily before breakfast. 05/23/20  Yes Carlis Stable, NP  PARoxetine (PAXIL) 40 MG tablet Take 1 tablet (40 mg total) by mouth daily. 02/20/21  Yes Norman Clay, MD  QUEtiapine (SEROQUEL) 100 MG tablet Take 1 tablet (100 mg total) by mouth at bedtime. 02/20/21  Yes Hisada, Elie Goody, MD  senna-docusate (SENOKOT-S) 8.6-50 MG tablet Take 1 tablet by mouth at bedtime as needed for mild constipation. 09/04/21  Yes Johnson, Clanford L, MD  Sodium Phosphates (RA SALINE ENEMA RE) Place 1 Dose rectally once as needed (severe constipation). If no relief from both milk of magnesia, and Bisacodyl suppository, administer one dose of disposable saline enema.   Yes [provider]    Inpatient Medications: Scheduled Meds:  atorvastatin  80 mg Oral QHS   calcium-vitamin D  1 tablet Oral BID   colchicine  0.6 mg Oral Daily   diltiazem  120 mg Oral Daily   divalproex  1,000 mg Oral QPM   divalproex  250 mg Oral Daily   insulin aspart  0-5 Units Subcutaneous QHS   insulin aspart  0-9 Units Subcutaneous TID WC   levothyroxine  200 mcg Oral QAC breakfast   mirtazapine  15 mg Oral QHS   pantoprazole  40 mg Oral QAC breakfast   PARoxetine  40 mg Oral Daily   QUEtiapine  100 mg Oral QHS   sodium chloride flush  3 mL Intravenous Q12H   cyanocobalamin  2,000 mcg Oral Daily   Continuous Infusions:  sodium chloride     cefTRIAXone (ROCEPHIN)  IV 1 g (11/05/21 2212)   PRN Meds: sodium chloride, acetaminophen **OR** acetaminophen, albuterol, senna-docusate, sodium chloride flush  Allergies:   No Known Allergies  Social History:   Social History   Socioeconomic History   Marital status: Legally Separated    Spouse name: Not on file   Number of children: Not on file   Years of education: Not on file   Highest education level: Not on file  Occupational  History   Not on file  Tobacco Use   Smoking status: Every Day    Packs/day: 2.00    Years: 43.00    Pack years: 86.00    Types:  Cigarettes   Smokeless tobacco: Never  Substance and Sexual Activity   Alcohol use: No    Alcohol/week: 0.0 standard drinks   Drug use: No   Sexual activity: Not Currently    Birth control/protection: Surgical, Post-menopausal    Comment: tubal  Other Topics Concern   Not on file  Social History Narrative   Not on file   Social Determinants of Health   Financial Resource Strain: Not on file  Food Insecurity: No Food Insecurity   Worried About Running Out of Food in the Last Year: Never true   Ran Out of Food in the Last Year: Never true  Transportation Needs: No Transportation Needs   Lack of Transportation (Medical): No   Lack of Transportation (Non-Medical): No  Physical Activity: Not on file  Stress: Not on file  Social Connections: Not on file  Intimate Partner Violence: Not on file    Family History:   Family History  Problem Relation Age of Onset   Cancer Mother        unsure what type   Heart disease Father    Heart attack Father    Schizophrenia Brother    Stroke Brother    Cancer Son        bladder   Coronary artery disease Other    Diabetes type II Other    Colon cancer Neg Hx    Breast cancer Neg Hx      ROS:  Please see the history of present illness.  All other ROS reviewed and negative.     Physical Exam/Data:   Vitals:   11/06/21 0021 11/06/21 0355 11/06/21 0752 11/06/21 1102  BP: 123/66 (!) 143/70 (!) 110/53 135/65  Pulse: 85 90 75 86  Resp: 18 18 18 17   Temp: 98.2 F (36.8 C) 97.7 F (36.5 C) 97.8 F (36.6 C) 97.8 F (36.6 C)  TempSrc: Oral Oral Oral Oral  SpO2: 92% 93% 93% 92%  Weight:  65.1 kg    Height:        Intake/Output Summary (Last 24 hours) at 11/06/2021 1524 Last data filed at 11/06/2021 1101 Gross per 24 hour  Intake 580 ml  Output 1750 ml  Net -1170 ml   Last 3 Weights 11/06/2021  11/05/2021 10/09/2021  Weight (lbs) 143 lb 8.3 oz 135 lb 139 lb 6.4 oz  Weight (kg) 65.1 kg 61.236 kg 63.231 kg     Body mass index is 25.02 kg/m.  General: Frail, elderly female who appears older than her stated age, in no acute distress HEENT: normal for age Neck: no JVD Vascular: No carotid bruits; Distal pulses 2+ bilaterally Cardiac:  normal S1, S2; RRR; 2/6 murmur  Lungs:  clear to auscultation bilaterally, no wheezing, rhonchi or rales  Abd: soft, nontender, no hepatomegaly  Ext: no edema Musculoskeletal:  No deformities, BUE and BLE strength very weak but equal Skin: warm and dry  Neuro:  CNs 2-12 intact, no focal abnormalities noted Psych:  Normal affect   EKG:  The EKG was personally reviewed and demonstrates: Sinus rhythm, heart rate 96, diffuse T wave flattening, no significant change from 12/08/2020 Telemetry:  Telemetry was personally reviewed and demonstrates: Sinus rhythm  Relevant CV Studies:  ECHO: 11/06/2021  1. Left ventricular ejection fraction, by estimation, is 60 to 65%. The left ventricle has normal function. The left ventricle has no regional wall motion abnormalities. Left ventricular diastolic parameters are consistent with Grade I diastolic  dysfunction (impaired relaxation).   2. Right ventricular  systolic function is normal. The right ventricular size is normal. There is normal pulmonary artery systolic pressure.   3. Large circumferential pericardial effusion. Increased in since since 08/2021. Large pericardial effusion. The pericardial effusion is circumferential. There is no evidence of cardiac tamponade.   4. The mitral valve is normal in structure. No evidence of mitral valve regurgitation. No evidence of mitral stenosis.   5. The aortic valve is tricuspid. Aortic valve regurgitation is not  visualized. No aortic stenosis is present.   6. The inferior vena cava is normal in size with greater than 50%  respiratory variability, suggesting right atrial  pressure of 3 mmHg.   ECHO: 08/29/2021  1. Left ventricular ejection fraction, by estimation, is 65 to 70%. The left ventricle has normal function. The left ventricle has no regional wall motion abnormalities. There is mild left ventricular hypertrophy. Left ventricular diastolic parameters  are consistent with Grade I diastolic dysfunction (impaired relaxation).   2. Right ventricular systolic function is normal. The right ventricular  size is normal. There is normal pulmonary artery systolic pressure.   3. A small pericardial effusion is present. The pericardial effusion is  circumferential.   4. The mitral valve is normal in structure. No evidence of mitral valve regurgitation. No evidence of mitral stenosis.   5. The aortic valve is tricuspid. Aortic valve regurgitation is not  visualized. No aortic stenosis is present.   6. The inferior vena cava is normal in size with greater than 50%  respiratory variability, suggesting right atrial pressure of 3 mmHg.   MYOVIEW: 2012 Stress Data: Treadmill exercise performed to a workload of 4.6 mets and a heart rate of 131, 80% of age predicted maximum.  Exercise discontinued due to dyspnea and fatigue; no chest discomfort reported.  Blood pressure increased from a resting value of 130/70 to 140/80 in recovery, probably a normal response, as intervening measurements were not obtained.  No arrhythmias noted.   EKG: Normal sinus rhythm; nonspecific ST-segment abnormality.  Stress EKG:  Interpretation is somewhat impaired by artifact; no  significant abnormalities identified.   Scintigraphic Data: Acquisition notable for moderate breast  attenuation.  Left ventricular size was normal.  On tomographic  images reconstructed in standard planes, there was a very small  focal septal defect of mild intensity for which no reversibility  was appreciated.  A second very small defect in the mid and distal  inferolateral lateral segment extending apically of  borderline  numeric significance with minor reversibility was also noted.  The  gated reconstruction demonstrated normal to hyperdynamic regional and global LV systolic function as well as normal systolic  accentuation of activity throughout.  Estimated ejection fraction  exceeded 65%.   IMPRESSION:  Probably negative stress nuclear myocardial study revealing  impaired exercise capacity, normal left ventricular size, normal  left ventricular systolic function and no significant stress-  induced EKG abnormalities.  By scintigraphic imaging, there were  two areas of abnormal tracer uptake possibly representing breast  attenuation and a right ventricular insertion artifact.  Small  areas of scarring cannot be unequivocally excluded.  No convincing evidence for significant ischemia.  Other findings as noted.   Laboratory Data:  High Sensitivity Troponin:   Recent Labs  Lab 11/04/21 0034 11/05/21 0135 11/05/21 0425 11/05/21 0700  TROPONINIHS 12 12 12 9      Chemistry Recent Labs  Lab 11/04/21 1959 11/04/21 2006 11/05/21 0425 11/06/21 0319  NA 136 139 133* 136  K 4.6 4.5 4.6 4.1  CL  99  --  96* 97*  CO2 28  --  26 31  GLUCOSE 164*  --  210* 172*  BUN 63*  --  54* 44*  CREATININE 1.25*  --  1.05* 0.99  CALCIUM 10.1  --  9.1 10.3  MG  --   --   --  1.7  GFRNONAA 47*  --  58* >60  ANIONGAP 9  --  11 8    Recent Labs  Lab 11/04/21 1959 11/05/21 0425  PROT 6.2* 5.5*  ALBUMIN 2.4* 2.2*  AST 42* 44*  ALT 25 27  ALKPHOS 70 61  BILITOT 0.7 1.1   Lipids No results for input(s): CHOL, TRIG, HDL, LABVLDL, LDLCALC, CHOLHDL in the last 168 hours.  Hematology Recent Labs  Lab 11/04/21 1959 11/04/21 2006 11/05/21 0425 11/06/21 0319  WBC 7.5  --  7.6 7.1  RBC 4.00  --  3.36* 3.59*  HGB 12.6 11.6* 10.7* 11.3*  HCT 38.1 34.0* 32.3* 33.3*  MCV 95.3  --  96.1 92.8  MCH 31.5  --  31.8 31.5  MCHC 33.1  --  33.1 33.9  RDW 15.5  --  15.1 14.8  PLT 290  --  272 264   Thyroid   Recent Labs  Lab 11/05/21 0425  TSH 0.621    BNP Recent Labs  Lab 11/04/21 1959  BNP 247.7*    DDimer No results for input(s): DDIMER in the last 168 hours.   Radiology/Studies:  DG Chest 1 View  Result Date: 11/04/2021 CLINICAL DATA:  Altered mental status. EXAM: CHEST  1 VIEW COMPARISON:  September 02, 2021 FINDINGS: Mild to moderate severity infiltrate is seen within the left lung base. There is no evidence of a pleural effusion or pneumothorax. There is moderate to marked severity enlargement of the cardiac silhouette. This is increased in size when compared to the prior study, which may be technical in nature. Moderate to marked severity calcification of the aortic arch is noted. The visualized skeletal structures are unremarkable. IMPRESSION: Moderate to marked severity cardiomegaly with mild to moderate severity left basilar infiltrate. Electronically Signed   By: Virgina Norfolk M.D.   On: 11/04/2021 20:12   CT HEAD WO CONTRAST (5MM)  Result Date: 11/04/2021 CLINICAL DATA:  Altered level of consciousness for 2 days EXAM: CT HEAD WITHOUT CONTRAST TECHNIQUE: Contiguous axial images were obtained from the base of the skull through the vertex without intravenous contrast. COMPARISON:  09/03/2021, 09/02/2021 FINDINGS: Brain: Chronic ischemic changes are seen within the bilateral basal ganglia, right greater than left. No evidence of acute infarct or hemorrhage. Lateral ventricles and remaining midline structures are unremarkable. No acute extra-axial fluid collections. No mass effect. Vascular: No hyperdense vessel or unexpected calcification. Skull: Normal. Negative for fracture or focal lesion. Sinuses/Orbits: No acute finding. Other: None. IMPRESSION: 1. Chronic small vessel ischemic changes within the basal ganglia. No acute intracranial process. Electronically Signed   By: Randa Ngo M.D.   On: 11/04/2021 19:47   CT Angio Chest PE W and/or Wo Contrast  Result Date:  11/04/2021 CLINICAL DATA:  Hypoxia, abdominal pain EXAM: CT ANGIOGRAPHY CHEST CT ABDOMEN AND PELVIS WITH CONTRAST TECHNIQUE: Multidetector CT imaging of the chest was performed using the standard protocol during bolus administration of intravenous contrast. Multiplanar CT image reconstructions and MIPs were obtained to evaluate the vascular anatomy. Multidetector CT imaging of the abdomen and pelvis was performed using the standard protocol during bolus administration of intravenous contrast. CONTRAST:  159mL OMNIPAQUE IOHEXOL 350  MG/ML SOLN COMPARISON:  11/04/2021, 03/18/2020 FINDINGS: CTA CHEST FINDINGS Cardiovascular: This is a technically adequate evaluation of the pulmonary vasculature. No filling defects or pulmonary emboli. There is a large pericardial effusion measuring up to 2.8 cm in thickness. There is subtle pericardial enhancement, concerning for exudative effusion. No evidence of thoracic aortic aneurysm or dissection. Moderate atherosclerosis of the aorta and coronary vasculature. Mediastinum/Nodes: No enlarged mediastinal, hilar, or axillary lymph nodes. Thyroid gland, trachea, and esophagus demonstrate no significant findings. Lungs/Pleura: Small left pleural effusion with left lower lobe compressive atelectasis. Mild bilateral perihilar interstitial and ground-glass opacities consistent with mild edema. No pneumothorax. Central airways are patent. Musculoskeletal: No acute or destructive bony lesions. Reconstructed images demonstrate no additional findings. Review of the MIP images confirms the above findings. CT ABDOMEN and PELVIS FINDINGS Hepatobiliary: Reflux of contrast into the hepatic veins suggests cardiac dysfunction. No focal liver abnormality. Gallbladder is surgically absent. Pancreas: Unremarkable. No pancreatic ductal dilatation or surrounding inflammatory changes. Spleen: Normal in size without focal abnormality. Adrenals/Urinary Tract: Adrenal glands are unremarkable. Kidneys are  normal, without renal calculi, focal lesion, or hydronephrosis. Bladder is unremarkable. Stomach/Bowel: No bowel obstruction or ileus. Normal appendix right lower quadrant. Mild wall thickening involving the distal transverse and descending colon is nonspecific given decompressed state, and underlying colitis cannot be excluded. No associated inflammatory change. Vascular/Lymphatic: Significant atherosclerosis of the aorta, with focal high-grade stenosis of the distal aorta just proximal to the bifurcation. Please correlate with any symptoms of claudication. No pathologic adenopathy within the abdomen or pelvis. Reproductive: Uterus and bilateral adnexa are unremarkable. Other: No free fluid or free gas.  No abdominal wall hernia. Musculoskeletal: No acute or destructive bony lesions. Prominent spondylosis at L5-S1. Reconstructed images demonstrate no additional findings. Review of the MIP images confirms the above findings. IMPRESSION: 1. Large pericardial effusion, with pericardial enhancement concerning for pericarditis and exudative effusion. 2. Mild perihilar edema, small left pleural effusion, and reflux of contrast into the hepatic veins may reflect an element of constrictive pericarditis. Correlation with echocardiography is recommended. 3. No evidence of pulmonary embolus. 4. Nonspecific segmental distal colonic wall thickening. While this could be due to decompressed state, mild colitis cannot be excluded. 5. Aortic Atherosclerosis (ICD10-I70.0). High-grade stenosis at the aortic bifurcation from heavily calcified atheromatous plaque unchanged. Please correlate with any symptoms of lower extremity claudication. Electronically Signed   By: Randa Ngo M.D.   On: 11/04/2021 23:15   MR BRAIN WO CONTRAST  Result Date: 11/05/2021 CLINICAL DATA:  Altered mental status, recent history of multiple territory strokes EXAM: MRI HEAD WITHOUT CONTRAST TECHNIQUE: Multiplanar, multiecho pulse sequences of the  brain and surrounding structures were obtained without intravenous contrast. COMPARISON:  CT head dated 1 day prior, MR head 09/03/2021 FINDINGS: Brain: There is faintly elevated DWI signal in the posterior limb of the right internal capsule and lentiform nucleus without convincing low ADC signal. There is associated FLAIR signal abnormality. This signal abnormality was present on the brain MRI from 09/03/2021 and likely reflects late subacute infarct. Curvilinear SWI signal dropout in this region likely reflects petechial blood products. There is an additional faint focus of diffusion restriction in the posterior limb of the left internal capsule with mildly low ADC signal and FLAIR signal abnormality which was not present on the prior brain MRI, likely reflecting more recent early subacute stroke. There is no evidence of associated hemorrhage. There is no other evidence of acute infarct. There is no evidence of acute intracranial hemorrhage or extra-axial fluid  collection. There is unchanged mild global parenchymal volume loss with prominence of the ventricular system and extra-axial CSF spaces. Additional foci of FLAIR signal abnormality in the subcortical and periventricular white matter likely reflect sequela of chronic white matter microangiopathy. A few additional remote lacunar infarcts are unchanged. There is no solid mass lesion.  There is no midline shift. Vascular: Normal flow voids. Skull and upper cervical spine: Normal marrow signal. Sinuses/Orbits: The paranasal sinuses are clear. The globes and orbits are unremarkable. Other: There are bilateral mastoid effusions, unchanged. IMPRESSION: 1. Small infarct in the left internal capsule is new since the prior brain MRI of 09/03/2021, likely subacute in chronicity. 2. Evolving late subacute to early chronic infarcts in the right internal capsule/basal ganglia. 3. No other acute infarct or other acute intracranial pathology. Electronically Signed   By:  Valetta Mole M.D.   On: 11/05/2021 12:27   CT ABDOMEN PELVIS W CONTRAST  Result Date: 11/04/2021 CLINICAL DATA:  Hypoxia, abdominal pain EXAM: CT ANGIOGRAPHY CHEST CT ABDOMEN AND PELVIS WITH CONTRAST TECHNIQUE: Multidetector CT imaging of the chest was performed using the standard protocol during bolus administration of intravenous contrast. Multiplanar CT image reconstructions and MIPs were obtained to evaluate the vascular anatomy. Multidetector CT imaging of the abdomen and pelvis was performed using the standard protocol during bolus administration of intravenous contrast. CONTRAST:  179mL OMNIPAQUE IOHEXOL 350 MG/ML SOLN COMPARISON:  11/04/2021, 03/18/2020 FINDINGS: CTA CHEST FINDINGS Cardiovascular: This is a technically adequate evaluation of the pulmonary vasculature. No filling defects or pulmonary emboli. There is a large pericardial effusion measuring up to 2.8 cm in thickness. There is subtle pericardial enhancement, concerning for exudative effusion. No evidence of thoracic aortic aneurysm or dissection. Moderate atherosclerosis of the aorta and coronary vasculature. Mediastinum/Nodes: No enlarged mediastinal, hilar, or axillary lymph nodes. Thyroid gland, trachea, and esophagus demonstrate no significant findings. Lungs/Pleura: Small left pleural effusion with left lower lobe compressive atelectasis. Mild bilateral perihilar interstitial and ground-glass opacities consistent with mild edema. No pneumothorax. Central airways are patent. Musculoskeletal: No acute or destructive bony lesions. Reconstructed images demonstrate no additional findings. Review of the MIP images confirms the above findings. CT ABDOMEN and PELVIS FINDINGS Hepatobiliary: Reflux of contrast into the hepatic veins suggests cardiac dysfunction. No focal liver abnormality. Gallbladder is surgically absent. Pancreas: Unremarkable. No pancreatic ductal dilatation or surrounding inflammatory changes. Spleen: Normal in size without  focal abnormality. Adrenals/Urinary Tract: Adrenal glands are unremarkable. Kidneys are normal, without renal calculi, focal lesion, or hydronephrosis. Bladder is unremarkable. Stomach/Bowel: No bowel obstruction or ileus. Normal appendix right lower quadrant. Mild wall thickening involving the distal transverse and descending colon is nonspecific given decompressed state, and underlying colitis cannot be excluded. No associated inflammatory change. Vascular/Lymphatic: Significant atherosclerosis of the aorta, with focal high-grade stenosis of the distal aorta just proximal to the bifurcation. Please correlate with any symptoms of claudication. No pathologic adenopathy within the abdomen or pelvis. Reproductive: Uterus and bilateral adnexa are unremarkable. Other: No free fluid or free gas.  No abdominal wall hernia. Musculoskeletal: No acute or destructive bony lesions. Prominent spondylosis at L5-S1. Reconstructed images demonstrate no additional findings. Review of the MIP images confirms the above findings. IMPRESSION: 1. Large pericardial effusion, with pericardial enhancement concerning for pericarditis and exudative effusion. 2. Mild perihilar edema, small left pleural effusion, and reflux of contrast into the hepatic veins may reflect an element of constrictive pericarditis. Correlation with echocardiography is recommended. 3. No evidence of pulmonary embolus. 4. Nonspecific segmental distal colonic wall  thickening. While this could be due to decompressed state, mild colitis cannot be excluded. 5. Aortic Atherosclerosis (ICD10-I70.0). High-grade stenosis at the aortic bifurcation from heavily calcified atheromatous plaque unchanged. Please correlate with any symptoms of lower extremity claudication. Electronically Signed   By: Randa Ngo M.D.   On: 11/04/2021 23:15   ECHOCARDIOGRAM COMPLETE  Result Date: 11/05/2021    ECHOCARDIOGRAM REPORT   Patient Name:   Claudia Keller Date of Exam: 11/05/2021  Medical Rec #:  [id number]      Height:       63.5 in Accession #:    [id number]     Weight:       135.0 lb Date of Birth:  03/19/1951      BSA:          1.646 m Patient Age:    72 years       BP:           160/88 mmHg Patient Gender: F              HR:           94 bpm. Exam Location:  Inpatient Procedure: 2D Echo, Cardiac Doppler and Color Doppler Indications:    Stroke  History:        Patient has prior history of Echocardiogram examinations, most                 recent 09/03/2021. Stroke; Risk Factors:Former Smoker,                 Hypertension and Diabetes.  Sonographer:    Merrie Roof RDCS Referring Phys: [id number] [place]  1. Left ventricular ejection fraction, by estimation, is 60 to 65%. The left ventricle has normal function. The left ventricle has no regional wall motion abnormalities. Left ventricular diastolic parameters are consistent with Grade I diastolic dysfunction (impaired relaxation).  2. Right ventricular systolic function is normal. The right ventricular size is normal. There is normal pulmonary artery systolic pressure.  3. Large circumferential pericardial effusion. Increased in since since 08/2021. Large pericardial effusion. The pericardial effusion is circumferential. There is no evidence of cardiac tamponade.  4. The mitral valve is normal in structure. No evidence of mitral valve regurgitation. No evidence of mitral stenosis.  5. The aortic valve is tricuspid. Aortic valve regurgitation is not visualized. No aortic stenosis is present.  6. The inferior vena cava is normal in size with greater than 50% respiratory variability, suggesting right atrial pressure of 3 mmHg. FINDINGS  Left Ventricle: Left ventricular ejection fraction, by estimation, is 60 to 65%. The left ventricle has normal function. The left ventricle has no regional wall motion abnormalities. The left ventricular internal cavity size was normal in size. There is  no left ventricular hypertrophy. Left  ventricular diastolic parameters are consistent with Grade I diastolic dysfunction (impaired relaxation). Right Ventricle: The right ventricular size is normal. No increase in right ventricular wall thickness. Right ventricular systolic function is normal. There is normal pulmonary artery systolic pressure. The tricuspid regurgitant velocity is 1.62 m/s, and  with an assumed right atrial pressure of 3 mmHg, the estimated right ventricular systolic pressure is 82.5 mmHg. Left Atrium: Left atrial size was normal in size. Right Atrium: Right atrial size was normal in size. Pericardium: Large circumferential pericardial effusion. Increased in since since 08/2021. A large pericardial effusion is present. The pericardial effusion is circumferential. There is diastolic collapse of the right ventricular free wall. There is no  evidence of cardiac tamponade. Presence of epicardial fat layer. Mitral Valve: The mitral valve is normal in structure. No evidence of mitral valve regurgitation. No evidence of mitral valve stenosis. Tricuspid Valve: The tricuspid valve is normal in structure. Tricuspid valve regurgitation is trivial. No evidence of tricuspid stenosis. Aortic Valve: The aortic valve is tricuspid. Aortic valve regurgitation is not visualized. No aortic stenosis is present. Aortic valve mean gradient measures 4.0 mmHg. Aortic valve peak gradient measures 6.9 mmHg. Aortic valve area, by VTI measures 2.67 cm. Pulmonic Valve: The pulmonic valve was normal in structure. Pulmonic valve regurgitation is not visualized. No evidence of pulmonic stenosis. Aorta: The aortic root is normal in size and structure. Venous: The inferior vena cava is normal in size with greater than 50% respiratory variability, suggesting right atrial pressure of 3 mmHg. IAS/Shunts: No atrial level shunt detected by color flow Doppler.  LEFT VENTRICLE PLAX 2D LVIDd:         3.60 cm   Diastology LVIDs:         2.40 cm   LV e' medial:    6.76 cm/s LV  PW:         0.90 cm   LV E/e' medial:  10.7 LV IVS:        1.10 cm   LV e' lateral:   7.42 cm/s LVOT diam:     1.80 cm   LV E/e' lateral: 9.8 LV SV:         51 LV SV Index:   31 LVOT Area:     2.54 cm  RIGHT VENTRICLE          IVC RV Basal diam:  3.00 cm  IVC diam: 1.50 cm LEFT ATRIUM             Index        RIGHT ATRIUM           Index LA diam:        3.20 cm 1.94 cm/m   RA Area:     16.50 cm LA Vol (A2C):   41.5 ml 25.22 ml/m  RA Volume:   46.40 ml  28.19 ml/m LA Vol (A4C):   43.9 ml 26.67 ml/m LA Biplane Vol: 44.1 ml 26.80 ml/m  AORTIC VALVE AV Area (Vmax):    2.39 cm AV Area (Vmean):   2.31 cm AV Area (VTI):     2.67 cm AV Vmax:           131.00 cm/s AV Vmean:          90.100 cm/s AV VTI:            0.190 m AV Peak Grad:      6.9 mmHg AV Mean Grad:      4.0 mmHg LVOT Vmax:         123.00 cm/s LVOT Vmean:        81.700 cm/s LVOT VTI:          0.199 m LVOT/AV VTI ratio: 1.05  AORTA Ao Root diam: 2.80 cm Ao Asc diam:  2.90 cm MITRAL VALVE                TRICUSPID VALVE MV Area (PHT): 4.57 cm     TR Peak grad:   10.5 mmHg MV Decel Time: 166 msec     TR Vmax:        162.00 cm/s MV E velocity: 72.60 cm/s MV A velocity: 117.00 cm/s  SHUNTS MV E/A ratio:  0.62         Systemic VTI:  0.20 m                             Systemic Diam: 1.80 cm Skeet Latch MD Electronically signed by Skeet Latch MD Signature Date/Time: 11/05/2021/4:34:25 PM    Final      Assessment and Plan:   Large pericardial effusion -It was seen on her 08/2021 echo, but was small, no greatly increased in size - Her blood pressure has been up and down since admission, as low as 110/53, but as high as 177/73. - Do not have the equipment to get pulsus paradoxus -No RA collapse on echo - Although her effusion is large, she does not have tamponade by [facility] or by echo imaging -However, it is very concerning that the effusion has grown from small to large in 2 months, so perhaps the best option is to go ahead and put in a  drain, evaluate the fluid to determine the cause, and be able to focus treatment appropriately. - On the CT report,There is subtle pericardial enhancement, concerning for exudative effusion.  Echocardiogram personally reviewed.  Relatively sizable circumferential pericardial effusion with fibrinous material and pericardial thickening.  This is concerning for possible hemorrhagic effusion.  At this point, she does not appear to be in tamponade physiology, but with a enlarging effusion, she is at risk for develop developing tamponade.  Options will be waiting until Monday to perform pericardiocentesis however the question will be then what to be do with her anticoagulation.  If this is truly a bloody effusion, we would not want her to be on anticoagulation.  If we were to perform pericardiocentesis tonight, we will advance to the questions about the type of effusion, reduce the risk of tamponade physiology for now and allow time to make decisions about her overall levels of care.  Indeed if there is a large recurring effusion and concern for possible constrictive pericarditis in the future, this would potentially add to the information when it comes to decision making about potential palliative care.  2.  CHF exacerbation - BNP is 247, CHF seen on chest CT -She got Lasix 40 mg IV x2 doses yesterday and 1 dose today but it has been discontinued - With the large pericardial effusion, she is preload dependent so agree with stopping the Lasix for now -Also because of the pericardial effusion, volume status is difficult to assess but her PAS was normal on echo  3.  UTI, metabolic encephalopathy - She has been started on Rocephin, urine culture with multiple species - History of vascular dementia secondary to multiple strokes - MRI consistent with a few additional subacute ischemic strokes his last MRI 2 months ago. - Eliquis fell off her treatment plan when she was transferred from one SNF to another, it has  been resumed here - She is currently not hypoxemic, although O2 sats are low normal on room air, suspect they would drop further with any activity  4.  PAF: - She was in sinus rhythm on admission and is currently maintaining sinus rhythm - With recurrent CVAs on MRI, continue his anticoagulation is very important - Add heparin if Eliquis cannot be resumed right away   Risk Assessment/Risk Scores:       New York Heart Association (NYHA) Functional Class NYHA Class III  CHA2DS2-VASc Score = 8   This indicates a 10.8% annual risk of stroke.  The patient's score is based upon: CHF History: 1 HTN History: 1 Diabetes History: 1 Stroke History: 2 Vascular Disease History: 1 Age Score: 1 Gender Score: 1    For questions or updates, please contact [place] Please consult [email or web address] for contact info under    Signed, Rosaria Ferries, PA-C  11/06/2021 3:24 PM   ATTENDING ATTESTATION  I have seen, examined and evaluated the patient this PM along with Rosaria Ferries, PA.  After reviewing all the available data and chart, we discussed the patients laboratory, study & physical findings as well as symptoms in detail. I agree with her findings, examination as well as impression recommendations as per our discussion.    Attending adjustments noted in italics.   Tough situation.  I had a long talk with the patient's power of attorney (son) via FaceTime along with her daughters that were present in the room.  We discussed the findings of pericardial effusion that is notably worsened since October.  Not clear what the etiology is, however it is concerned that is possibly related to being on anticoagulation.  Recommendation would be to undergo pericardiocentesis in order to drain the effusion to reduce the risk of pericardial tamponade.  We could also determine if this is a hemorrhagic effusion versus nonhemorrhagic.  Indeed, if it is hemorrhagic, it would potentially change decision about  restarting anticoagulation at least for the short duration.  This is in a patient who has had multiple strokes while being off her DOAC.   I had a long discussion with the patient's family since she herself is not neurologically capable of making her own decisions.  She is talking about her grandkids as opposed to the procedure were discussing.  Therefore the son is decided to go ahead and proceed with the procedure.  Shared Decision Making/Informed Consent{ Pericardiocentesis procedure explained to patient and family in detail.  Risks include but not limited to right ventricular puncture, coronary artery perforation, liver injury, lung injury +/- pneumothorax.  Also recurrence of fluid and infection. After considering the risks benefits alternatives indications, the patient's son and 2 daughters agreed with the plan to proceed with the procedure today.  This would obviate the need for potentially doing it emergently over the weekend.  More recommendations to follow after pericardiocentesis completed.   Glenetta Hew, M.D., M.S. Interventional Cardiologist   Pager # [phone] Phone # [phone] [address] [place], [place] [address]

## 2021-11-06 NOTE — Interval H&P Note (Signed)
History and Physical Interval Note:  11/06/2021 4:40 PM  Claudia Keller  has presented today for surgery, with the diagnosis of chest pain.  The various methods of treatment have been discussed with the patient and family. After consideration of risks, benefits and other options for treatment, the patient has consented to  Procedure(s): PERICARDIOCENTESIS (N/A) as a surgical intervention.  The patient's history has been reviewed, patient examined, no change in status, stable for surgery.  I have reviewed the patient's chart and labs.  Questions were answered to the patient's satisfaction.     [name]

## 2021-11-06 NOTE — Consult Note (Signed)
Consultation Note Date: 11/06/2021   Patient Name: Claudia Keller  DOB: 26-May-1951  MRN: [id number]  Age / Sex: 70 y.o., female  PCP: [facility] [place] Referring Physician: Barb Merino, MD  Reason for Consultation: Establishing goals of care  HPI/Patient Profile: 70 y.o. female  with past medical history of  HFpEF, recent CVA (October 2022) with residual left side weakness, COPD, HTN, DMT2, IBS, and hypothyroidism who presented to the emergency department from SNF on 11/04/2021 with confusion and hypoxemia. She recently had lab work at the facility showing sodium of 146 and was found to be hypoxic 86-87% per EMS. In the ED, she was found to have pericarditis and a large pericardial effusion on CT scan. BNP is elevated. UA is suspicious for UTI. Admitted to [facility] with pericarditis and acute on chronic heart failure.    Clinical Assessment and Goals of Care: I have reviewed medical records including EPIC notes, labs and imaging, and discussed with social worked at [place]. The social worker reports that patient will no longer be receiving PACE services starting 11/08/21.  I went to see the patient at bedside. She is alert and oriented to person and place. No acute complaints at this time.   I spoke with her son/Claudia Keller by phone to discuss diagnosis, prognosis, GOC, EOL wishes, disposition, and options. I introduced Palliative Medicine as specialized medical care for people living with serious illness. It focuses on providing relief from the symptoms and stress of a serious illness.   We discussed a brief life review of the patient. She has been on disability for many years. She had 4 children with her 1st husband, and they later divorced. Claudia Keller reports that he is her POC and HCPOA. She had another son who was born with severe MR and was placed for adoption. She also has 2  daughters, Claudia Keller and Claudia Keller.   Prior to the acute acute CVA on 09/02/21, patient was living independently in her home although was having some ambulatory dysfunction and concern for falls. As far as functional and nutritional status, there has been significant decline since the stroke. Patient has severe residual left-sided weakness and is mostly wheelchair bound at baseline. She is completely dependent for ADLs. Claudia Keller also reports that she seems to have some cognitive impairment as well; that her "mind is not clear".   We discussed her current illness and what it means in the larger context of her ongoing co-morbidities. Discussed the natural disease trajectory of a sudden, severe neurological injury such as acute CVA. Discussed that patient may not return to his/her previous baseline. Provided education that prognostication can be challenging due to the uncertainty predicting neurologic outcome as well as the time required to allow for optimal recovery (weeks to sometimes months).   Claudia Keller reports that after the stroke, patient was discharged to [facility] for rehab. He does not feel that she improved while in rehab. Discussed that at this point, optimal recovery from the stroke has probably occurred and  patient's baseline functional status is unlikely to improve.   I attempted to elicit values and goals of care important to the patient. Claudia Keller reports that patient would want to be at home, but he knows this is not a realistic goal at this point. Discussed that patient will likely be dependent for care long-term and whether this would be acceptable quality of life for her. Claudia Keller feels that as long as patient can interact with family, he considers this to be acceptable quality of life.   The difference between aggressive medical intervention and comfort care was considered. At this time, Claudia Keller wants to pursue full scope medical interventions . We did discuss code status. Encouraged  DNR/DNI status  understanding evidenced based poor outcomes in similar hospitalized patients, as the cause of the arrest is likely associated with chronic/terminal disease rather than a reversible acute cardio-pulmonary event. I explained that DNR/DNI is a protective measure to keep [place] from harming the patient in their last moments of life. Claudia Keller reports that his mother previously stated to him (within the past year) that she would want to be resuscitated. For now, he intends to honor her wish but he acknowledges this may change pending her clinical course.   I discussed with Claudia Keller my recommendation to consider the addition of hospice support at some point in the future. I explained that hospice would be extra care for patient, communication with family, and assistance with symptom management and care when she further declines. Claudia Keller understands and will consider.  Primary decision maker: Claudia Keller (son and HCPOA)    SUMMARY OF RECOMMENDATIONS   Full code full scope Son Claudia Keller is HCPOA - I have asked him to bring a copy of this document Per social worked at [place], patient will no longer be receiving services starting 11/08/21 Patient would likely be eligible for hospice services at SNF if/when family is ready  PMT will continue to follow   Code Status/Advance Care Planning: Full code   Additional Recommendations (Limitations, Scope, Preferences): Full Scope Treatment  Prognosis:  < 6 months (would not be surprising)  Discharge Planning: To Be Determined      Primary Diagnoses: Present on Admission:  (HFpEF) heart failure with preserved ejection fraction ([place])  Essential hypertension, benign  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 (H[facility])  COPD (chronic obstructive pulmonary disease) ([place])   I have reviewed the medical record, interviewed the patient and family, and examined the patient. The following aspects are pertinent.  Past Medical History:  Diagnosis Date   Arthritis     COPD (chronic obstructive pulmonary disease) ([place])    Diverticulosis    Essential hypertension, benign    Hx of colonic polyps    Hypothyroidism    IBS (irritable bowel syndrome)    Irritable bowel syndrome    Ketoacidosis    Mixed hyperlipidemia    PTSD (post-traumatic stress disorder)    S/P colonoscopy Jan 2011   RMR: pancolonic diverticula, polypoid rectal mucosa with  prominent lymphoid aggregates, repeat in Jan 2016    Shortness of breath dyspnea    Stress incontinence, female    Thyroid disease    Tobacco use    Type 2 diabetes mellitus ([place])      Family History  Problem Relation Age of Onset   Cancer Mother        unsure what type   Heart disease Father    Heart attack Father    Schizophrenia Brother  Stroke Brother    Cancer Son        bladder   Coronary artery disease Other    Diabetes type II Other    Colon cancer Neg Hx    Breast cancer Neg Hx    Scheduled Meds:  atorvastatin  80 mg Oral QHS   calcium-vitamin D  1 tablet Oral BID   colchicine  0.6 mg Oral Daily   diltiazem  120 mg Oral Daily   divalproex  1,000 mg Oral QPM   divalproex  250 mg Oral Daily   insulin aspart  0-5 Units Subcutaneous QHS   insulin aspart  0-9 Units Subcutaneous TID WC   levothyroxine  200 mcg Oral QAC breakfast   mirtazapine  15 mg Oral QHS   pantoprazole  40 mg Oral QAC breakfast   PARoxetine  40 mg Oral Daily   QUEtiapine  100 mg Oral QHS   sodium chloride flush  3 mL Intravenous Q12H   cyanocobalamin  2,000 mcg Oral Daily   Continuous Infusions:  sodium chloride     cefTRIAXone (ROCEPHIN)  IV 1 g (11/05/21 2212)   PRN Meds:.sodium chloride, acetaminophen **OR** acetaminophen, albuterol, senna-docusate, sodium chloride flush   No Known Allergies Review of Systems  Neurological:  Positive for weakness.   Physical Exam Vitals reviewed.  Constitutional:      General: She is not in acute distress.    Comments: Chronically ill-appearing  Pulmonary:      Effort: Pulmonary effort is normal.  Neurological:     Mental Status: She is alert.     Comments: Left-sided weakness Oriented to person and place  Psychiatric:        Cognition and Memory: Cognition is impaired.    Vital Signs: BP 135/65 (BP Location: Left Arm)    Pulse 86    Temp 97.8 F (36.6 C) (Oral)    Resp 17    Ht 5' 3.5" (1.613 m)    Wt 65.1 kg    SpO2 92%    BMI 25.02 kg/m  Pain Scale: 0-10   Pain Score: 0-No pain   SpO2: SpO2: 92 % O2 Device:SpO2: 92 % O2 Flow Rate: .   IO: Intake/output summary:  Intake/Output Summary (Last 24 hours) at 11/06/2021 1319 Last data filed at 11/06/2021 1101 Gross per 24 hour  Intake 580 ml  Output 1750 ml  Net -1170 ml    LBM: Last BM Date: 11/05/21 Baseline Weight: Weight: 61.2 kg Most recent weight: Weight: 65.1 kg      Palliative Assessment/Data: PPS 30%     Time In: 1310 Time Out: 1422 Time Total: 72 minutes Greater than 50%  of this time was spent counseling and coordinating care related to the above assessment and plan.  Signed by: Lavena Bullion, NP   Please contact Palliative Medicine Team phone at [phone] for questions and concerns.  For individual provider: See Shea Evans

## 2021-11-06 NOTE — Plan of Care (Signed)

## 2021-11-06 NOTE — Progress Notes (Signed)
PROGRESS NOTE    Claudia Keller  QHU:[id number] DOB: 05-23-1951 DOA: 11/04/2021 PCP: [facility] [place]    Brief Narrative:  70 year old with history of hypertension, COPD, type 2 diabetes not on treatment, chronic diastolic heart failure, hypothyroidism, recent stroke with left hemiparesis, history of atrial fibrillation and currently a long-term nursing home resident brought from facility with sodium 146 and hypoxemic 87% on room air.  Family reported not acting quite right since last few days.  Patient is confused.  Further history taken from patient's son. In the emergency room CT scan of the chest abdomen pelvis was done, no evidence of pulmonary embolism.  She was found to have large pericardial effusion on CT scan.  She is on room air.  Electrolytes are normal.  COVID-19 negative.  Urinalysis abnormal.  Admitted to the hospital with UTI, metabolic encephalopathy.   Assessment & Plan:   Principal Problem:   (HFpEF) heart failure with preserved ejection fraction (HCC) Active Problems:   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 (HCC)   Essential hypertension, benign   COPD (chronic obstructive pulmonary disease) (HCC)   Pericarditis   Hypoxemia   UTI (urinary tract infection)   CKD (chronic kidney disease) stage 3, GFR 30-59 ml/min (HCC)   Prolonged QT interval   AMS (altered mental status)  Acute UTI present on admission infective metabolic encephalopathy in a patient with vascular dementia due to multiple stroke: Started on Rocephin.  Urine culture with multiple flora.  Will treat with 3 days of IV Rocephin. All-time fall precautions.  Delirium precautions. MRI of the brain consistent with few additional subacute ischemic stroke since last MRI 2 months ago.  Interruption in Eliquis therapy.  documented history of atrial fibrillation, discharged on Eliquis but fallen off the treatment plan when transferred from on his SNF to another SNF, resume  Eliquis 5 mg twice daily.  Discontinue aspirin. Patient is on symptomatic treatment with Seroquel, Depakote, Paxil.  Continue. Holding Eliquis today for anticipated pericardiocentesis.  Pericardial effusion: Reported on CT chest.  Patient on room air.  No evidence of decompensation or tamponade. Echocardiogram with large pericardial effusion.  No tamponade.  Possible hemopericardium with patient being on Eliquis.  Continue colchicine. TSH normal. Consulted cardiology to get their opinion, may benefit with diagnostic, therapeutic pericardiocentesis.  Hypothyroidism: On Synthroid.  Continue.  TSH is normal.  Type 2 diabetes: Diet controlled.  Continue monitoring and keep on sliding scale insulin.  Hypoxemia: Cause unknown.  Currently not hypoxemic.  Suspected diastolic heart failure exacerbation: Symptomatic treatment with IV Lasix.  Intake output monitoring.  Renal function monitoring.  Echo pending.   DVT prophylaxis:   Eliquis.  Hold.   Code Status: Full code.  Will consult palliative care Family Communication: Son on the phone. Disposition Plan: Status is: Inpatient  Remains inpatient appropriate because: Significant altered mental status         Consultants:  Palliative care Cardiology  Procedures:  None  Antimicrobials:  Rocephin 12/28-   Subjective: Patient seen and examined.  Pleasantly confused.  Not in any distress.  No other overnight events.  Occasionally impulsive. Patient herself tells me that she continues to have many strokes.  Objective: Vitals:   11/06/21 0021 11/06/21 0355 11/06/21 0752 11/06/21 1102  BP: 123/66 (!) 143/70 (!) 110/53 135/65  Pulse: 85 90 75 86  Resp: 18 18 18 17   Temp: 98.2 F (36.8 C) 97.7 F (36.5 C) 97.8 F (36.6 C) 97.8 F (36.6  C)  TempSrc: Oral Oral Oral Oral  SpO2: 92% 93% 93% 92%  Weight:  65.1 kg    Height:        Intake/Output Summary (Last 24 hours) at 11/06/2021 1300 Last data filed at 11/06/2021  1101 Gross per 24 hour  Intake 580 ml  Output 1750 ml  Net -1170 ml   Filed Weights   11/05/21 0608 11/06/21 0355  Weight: 61.2 kg 65.1 kg    Examination:  General exam: Appears anxious.  Pleasantly confused.  Alert oriented x0-1.   Left hemiparesis with motor power 2/5. Respiratory system: Clear to auscultation. Respiratory effort normal. Cardiovascular system: S1 & S2 heard, RRR. No JVD, murmurs, rubs, gallops or clicks. No pedal edema. Gastrointestinal system: Abdomen is nondistended, soft and nontender. No organomegaly or masses felt. Normal bowel sounds heard. Central nervous system: Alert and awake.  Anxious and pleasant, confused.  Moves all extremities but left side is weak.  Left leg is weaker than right.    Data Reviewed: I have personally reviewed following labs and imaging studies  CBC: Recent Labs  Lab 11/04/21 1959 11/04/21 2006 11/05/21 0425 11/06/21 0319  WBC 7.5  --  7.6 7.1  NEUTROABS 5.0  --   --  3.6  HGB 12.6 11.6* 10.7* 11.3*  HCT 38.1 34.0* 32.3* 33.3*  MCV 95.3  --  96.1 92.8  PLT 290  --  272 656   Basic Metabolic Panel: Recent Labs  Lab 11/04/21 1959 11/04/21 2006 11/05/21 0425 11/06/21 0319  NA 136 139 133* 136  K 4.6 4.5 4.6 4.1  CL 99  --  96* 97*  CO2 28  --  26 31  GLUCOSE 164*  --  210* 172*  BUN 63*  --  54* 44*  CREATININE 1.25*  --  1.05* 0.99  CALCIUM 10.1  --  9.1 10.3  MG  --   --   --  1.7  PHOS  --   --   --  2.6   GFR: Estimated Creatinine Clearance: 49.3 mL/min (by C-G formula based on SCr of 0.99 mg/dL). Liver Function Tests: Recent Labs  Lab 11/04/21 1959 11/05/21 0425  AST 42* 44*  ALT 25 27  ALKPHOS 70 61  BILITOT 0.7 1.1  PROT 6.2* 5.5*  ALBUMIN 2.4* 2.2*   No results for input(s): LIPASE, AMYLASE in the last 168 hours. No results for input(s): AMMONIA in the last 168 hours. Coagulation Profile: No results for input(s): INR, PROTIME in the last 168 hours. Cardiac Enzymes: No results for input(s):  CKTOTAL, CKMB, CKMBINDEX, TROPONINI in the last 168 hours. BNP (last 3 results) No results for input(s): PROBNP in the last 8760 hours. HbA1C: Recent Labs    11/05/21 0425  HGBA1C 7.8*   CBG: Recent Labs  Lab 11/05/21 1754 11/05/21 2049 11/05/21 2306 11/06/21 0600 11/06/21 1105  GLUCAP 204* 207* 167* 183* 146*   Lipid Profile: No results for input(s): CHOL, HDL, LDLCALC, TRIG, CHOLHDL, LDLDIRECT in the last 72 hours. Thyroid Function Tests: Recent Labs    11/05/21 0425  TSH 0.621   Anemia Panel: No results for input(s): VITAMINB12, FOLATE, FERRITIN, TIBC, IRON, RETICCTPCT in the last 72 hours. Sepsis Labs: No results for input(s): PROCALCITON, LATICACIDVEN in the last 168 hours.  Recent Results (from the past 240 hour(s))  Urine Culture     Status: Abnormal   Collection Time: 11/04/21  7:17 PM   Specimen: Urine, Catheterized  Result Value Ref Range Status   Specimen  Description URINE, CATHETERIZED  Final   Special Requests   Final    NONE Performed at [facility], [place] [address], [place], [place] [address]    Culture MULTIPLE SPECIES PRESENT, SUGGEST RECOLLECTION (A)  Final   Report Status 11/05/2021 FINAL  Final  Resp Panel by RT-PCR (Flu A&B, Covid) Nasopharyngeal Swab     Status: None   Collection Time: 11/04/21  7:23 PM   Specimen: Nasopharyngeal Swab; Nasopharyngeal(NP) swabs in vial transport medium  Result Value Ref Range Status   SARS Coronavirus 2 by RT PCR NEGATIVE NEGATIVE Final    Comment: (NOTE) SARS-CoV-2 target nucleic acids are NOT DETECTED.  The SARS-CoV-2 RNA is generally detectable in upper respiratory specimens during the acute phase of infection. The lowest concentration of SARS-CoV-2 viral copies this assay can detect is 138 copies/mL. A negative result does not preclude SARS-Cov-2 infection and should not be used as the sole basis for treatment or other patient management decisions. A negative result may occur with  improper  specimen collection/handling, submission of specimen other than nasopharyngeal swab, presence of viral mutation(s) within the areas targeted by this assay, and inadequate number of viral copies(<138 copies/mL). A negative result must be combined with clinical observations, patient history, and epidemiological information. The expected result is Negative.  Fact Sheet for Patients:  EntrepreneurPulse.com.au  Fact Sheet for Healthcare Providers:  IncredibleEmployment.be  This test is no t yet approved or cleared by the [place] FDA and  has been authorized for detection and/or diagnosis of SARS-CoV-2 by FDA under an Emergency Use Authorization (EUA). This EUA will remain  in effect (meaning this test can be used) for the duration of the COVID-19 declaration under Section 564(b)(1) of the Act, 21 U.S.C.section 360bbb-3(b)(1), unless the authorization is terminated  or revoked sooner.       Influenza A by PCR NEGATIVE NEGATIVE Final   Influenza B by PCR NEGATIVE NEGATIVE Final    Comment: (NOTE) The Xpert Xpress SARS-CoV-2/FLU/RSV plus assay is intended as an aid in the diagnosis of influenza from Nasopharyngeal swab specimens and should not be used as a sole basis for treatment. Nasal washings and aspirates are unacceptable for Xpert Xpress SARS-CoV-2/FLU/RSV testing.  Fact Sheet for Patients: EntrepreneurPulse.com.au  Fact Sheet for Healthcare Providers: IncredibleEmployment.be  This test is not yet approved or cleared by the [place] FDA and has been authorized for detection and/or diagnosis of SARS-CoV-2 by FDA under an Emergency Use Authorization (EUA). This EUA will remain in effect (meaning this test can be used) for the duration of the COVID-19 declaration under Section 564(b)(1) of the Act, 21 U.S.C. section 360bbb-3(b)(1), unless the authorization is terminated or revoked.  Performed at  [facility], [place] [address] [place], [place] [address]          Radiology Studies: DG Chest 1 View  Result Date: 11/04/2021 CLINICAL DATA:  Altered mental status. EXAM: CHEST  1 VIEW COMPARISON:  September 02, 2021 FINDINGS: Mild to moderate severity infiltrate is seen within the left lung base. There is no evidence of a pleural effusion or pneumothorax. There is moderate to marked severity enlargement of the cardiac silhouette. This is increased in size when compared to the prior study, which may be technical in nature. Moderate to marked severity calcification of the aortic arch is noted. The visualized skeletal structures are unremarkable. IMPRESSION: Moderate to marked severity cardiomegaly with mild to moderate severity left basilar infiltrate. Electronically Signed   By: Virgina Norfolk  M.D.   On: 11/04/2021 20:12   CT HEAD WO CONTRAST (5MM)  Result Date: 11/04/2021 CLINICAL DATA:  Altered level of consciousness for 2 days EXAM: CT HEAD WITHOUT CONTRAST TECHNIQUE: Contiguous axial images were obtained from the base of the skull through the vertex without intravenous contrast. COMPARISON:  09/03/2021, 09/02/2021 FINDINGS: Brain: Chronic ischemic changes are seen within the bilateral basal ganglia, right greater than left. No evidence of acute infarct or hemorrhage. Lateral ventricles and remaining midline structures are unremarkable. No acute extra-axial fluid collections. No mass effect. Vascular: No hyperdense vessel or unexpected calcification. Skull: Normal. Negative for fracture or focal lesion. Sinuses/Orbits: No acute finding. Other: None. IMPRESSION: 1. Chronic small vessel ischemic changes within the basal ganglia. No acute intracranial process. Electronically Signed   By: Randa Ngo M.D.   On: 11/04/2021 19:47   CT Angio Chest PE W and/or Wo Contrast  Result Date: 11/04/2021 CLINICAL DATA:  Hypoxia, abdominal pain EXAM: CT ANGIOGRAPHY CHEST CT ABDOMEN AND PELVIS WITH  CONTRAST TECHNIQUE: Multidetector CT imaging of the chest was performed using the standard protocol during bolus administration of intravenous contrast. Multiplanar CT image reconstructions and MIPs were obtained to evaluate the vascular anatomy. Multidetector CT imaging of the abdomen and pelvis was performed using the standard protocol during bolus administration of intravenous contrast. CONTRAST:  139mL OMNIPAQUE IOHEXOL 350 MG/ML SOLN COMPARISON:  11/04/2021, 03/18/2020 FINDINGS: CTA CHEST FINDINGS Cardiovascular: This is a technically adequate evaluation of the pulmonary vasculature. No filling defects or pulmonary emboli. There is a large pericardial effusion measuring up to 2.8 cm in thickness. There is subtle pericardial enhancement, concerning for exudative effusion. No evidence of thoracic aortic aneurysm or dissection. Moderate atherosclerosis of the aorta and coronary vasculature. Mediastinum/Nodes: No enlarged mediastinal, hilar, or axillary lymph nodes. Thyroid gland, trachea, and esophagus demonstrate no significant findings. Lungs/Pleura: Small left pleural effusion with left lower lobe compressive atelectasis. Mild bilateral perihilar interstitial and ground-glass opacities consistent with mild edema. No pneumothorax. Central airways are patent. Musculoskeletal: No acute or destructive bony lesions. Reconstructed images demonstrate no additional findings. Review of the MIP images confirms the above findings. CT ABDOMEN and PELVIS FINDINGS Hepatobiliary: Reflux of contrast into the hepatic veins suggests cardiac dysfunction. No focal liver abnormality. Gallbladder is surgically absent. Pancreas: Unremarkable. No pancreatic ductal dilatation or surrounding inflammatory changes. Spleen: Normal in size without focal abnormality. Adrenals/Urinary Tract: Adrenal glands are unremarkable. Kidneys are normal, without renal calculi, focal lesion, or hydronephrosis. Bladder is unremarkable. Stomach/Bowel: No  bowel obstruction or ileus. Normal appendix right lower quadrant. Mild wall thickening involving the distal transverse and descending colon is nonspecific given decompressed state, and underlying colitis cannot be excluded. No associated inflammatory change. Vascular/Lymphatic: Significant atherosclerosis of the aorta, with focal high-grade stenosis of the distal aorta just proximal to the bifurcation. Please correlate with any symptoms of claudication. No pathologic adenopathy within the abdomen or pelvis. Reproductive: Uterus and bilateral adnexa are unremarkable. Other: No free fluid or free gas.  No abdominal wall hernia. Musculoskeletal: No acute or destructive bony lesions. Prominent spondylosis at L5-S1. Reconstructed images demonstrate no additional findings. Review of the MIP images confirms the above findings. IMPRESSION: 1. Large pericardial effusion, with pericardial enhancement concerning for pericarditis and exudative effusion. 2. Mild perihilar edema, small left pleural effusion, and reflux of contrast into the hepatic veins may reflect an element of constrictive pericarditis. Correlation with echocardiography is recommended. 3. No evidence of pulmonary embolus. 4. Nonspecific segmental distal colonic wall thickening. While this could be  due to decompressed state, mild colitis cannot be excluded. 5. Aortic Atherosclerosis (ICD10-I70.0). High-grade stenosis at the aortic bifurcation from heavily calcified atheromatous plaque unchanged. Please correlate with any symptoms of lower extremity claudication. Electronically Signed   By: Randa Ngo M.D.   On: 11/04/2021 23:15   MR BRAIN WO CONTRAST  Result Date: 11/05/2021 CLINICAL DATA:  Altered mental status, recent history of multiple territory strokes EXAM: MRI HEAD WITHOUT CONTRAST TECHNIQUE: Multiplanar, multiecho pulse sequences of the brain and surrounding structures were obtained without intravenous contrast. COMPARISON:  CT head dated 1 day  prior, MR head 09/03/2021 FINDINGS: Brain: There is faintly elevated DWI signal in the posterior limb of the right internal capsule and lentiform nucleus without convincing low ADC signal. There is associated FLAIR signal abnormality. This signal abnormality was present on the brain MRI from 09/03/2021 and likely reflects late subacute infarct. Curvilinear SWI signal dropout in this region likely reflects petechial blood products. There is an additional faint focus of diffusion restriction in the posterior limb of the left internal capsule with mildly low ADC signal and FLAIR signal abnormality which was not present on the prior brain MRI, likely reflecting more recent early subacute stroke. There is no evidence of associated hemorrhage. There is no other evidence of acute infarct. There is no evidence of acute intracranial hemorrhage or extra-axial fluid collection. There is unchanged mild global parenchymal volume loss with prominence of the ventricular system and extra-axial CSF spaces. Additional foci of FLAIR signal abnormality in the subcortical and periventricular white matter likely reflect sequela of chronic white matter microangiopathy. A few additional remote lacunar infarcts are unchanged. There is no solid mass lesion.  There is no midline shift. Vascular: Normal flow voids. Skull and upper cervical spine: Normal marrow signal. Sinuses/Orbits: The paranasal sinuses are clear. The globes and orbits are unremarkable. Other: There are bilateral mastoid effusions, unchanged. IMPRESSION: 1. Small infarct in the left internal capsule is new since the prior brain MRI of 09/03/2021, likely subacute in chronicity. 2. Evolving late subacute to early chronic infarcts in the right internal capsule/basal ganglia. 3. No other acute infarct or other acute intracranial pathology. Electronically Signed   By: Valetta Mole M.D.   On: 11/05/2021 12:27   CT ABDOMEN PELVIS W CONTRAST  Result Date: 11/04/2021 CLINICAL  DATA:  Hypoxia, abdominal pain EXAM: CT ANGIOGRAPHY CHEST CT ABDOMEN AND PELVIS WITH CONTRAST TECHNIQUE: Multidetector CT imaging of the chest was performed using the standard protocol during bolus administration of intravenous contrast. Multiplanar CT image reconstructions and MIPs were obtained to evaluate the vascular anatomy. Multidetector CT imaging of the abdomen and pelvis was performed using the standard protocol during bolus administration of intravenous contrast. CONTRAST:  155mL OMNIPAQUE IOHEXOL 350 MG/ML SOLN COMPARISON:  11/04/2021, 03/18/2020 FINDINGS: CTA CHEST FINDINGS Cardiovascular: This is a technically adequate evaluation of the pulmonary vasculature. No filling defects or pulmonary emboli. There is a large pericardial effusion measuring up to 2.8 cm in thickness. There is subtle pericardial enhancement, concerning for exudative effusion. No evidence of thoracic aortic aneurysm or dissection. Moderate atherosclerosis of the aorta and coronary vasculature. Mediastinum/Nodes: No enlarged mediastinal, hilar, or axillary lymph nodes. Thyroid gland, trachea, and esophagus demonstrate no significant findings. Lungs/Pleura: Small left pleural effusion with left lower lobe compressive atelectasis. Mild bilateral perihilar interstitial and ground-glass opacities consistent with mild edema. No pneumothorax. Central airways are patent. Musculoskeletal: No acute or destructive bony lesions. Reconstructed images demonstrate no additional findings. Review of the MIP images confirms the  above findings. CT ABDOMEN and PELVIS FINDINGS Hepatobiliary: Reflux of contrast into the hepatic veins suggests cardiac dysfunction. No focal liver abnormality. Gallbladder is surgically absent. Pancreas: Unremarkable. No pancreatic ductal dilatation or surrounding inflammatory changes. Spleen: Normal in size without focal abnormality. Adrenals/Urinary Tract: Adrenal glands are unremarkable. Kidneys are normal, without renal  calculi, focal lesion, or hydronephrosis. Bladder is unremarkable. Stomach/Bowel: No bowel obstruction or ileus. Normal appendix right lower quadrant. Mild wall thickening involving the distal transverse and descending colon is nonspecific given decompressed state, and underlying colitis cannot be excluded. No associated inflammatory change. Vascular/Lymphatic: Significant atherosclerosis of the aorta, with focal high-grade stenosis of the distal aorta just proximal to the bifurcation. Please correlate with any symptoms of claudication. No pathologic adenopathy within the abdomen or pelvis. Reproductive: Uterus and bilateral adnexa are unremarkable. Other: No free fluid or free gas.  No abdominal wall hernia. Musculoskeletal: No acute or destructive bony lesions. Prominent spondylosis at L5-S1. Reconstructed images demonstrate no additional findings. Review of the MIP images confirms the above findings. IMPRESSION: 1. Large pericardial effusion, with pericardial enhancement concerning for pericarditis and exudative effusion. 2. Mild perihilar edema, small left pleural effusion, and reflux of contrast into the hepatic veins may reflect an element of constrictive pericarditis. Correlation with echocardiography is recommended. 3. No evidence of pulmonary embolus. 4. Nonspecific segmental distal colonic wall thickening. While this could be due to decompressed state, mild colitis cannot be excluded. 5. Aortic Atherosclerosis (ICD10-I70.0). High-grade stenosis at the aortic bifurcation from heavily calcified atheromatous plaque unchanged. Please correlate with any symptoms of lower extremity claudication. Electronically Signed   By: Randa Ngo M.D.   On: 11/04/2021 23:15   ECHOCARDIOGRAM COMPLETE  Result Date: 11/05/2021    ECHOCARDIOGRAM REPORT   Patient Name:   ZYRAH WISWELL Date of Exam: 11/05/2021 Medical Rec #:  [id number]      Height:       63.5 in Accession #:    [id number]     Weight:       135.0 lb Date  of Birth:  July 02, 1951      BSA:          1.646 m Patient Age:    45 years       BP:           160/88 mmHg Patient Gender: F              HR:           94 bpm. Exam Location:  Inpatient Procedure: 2D Echo, Cardiac Doppler and Color Doppler Indications:    Stroke  History:        Patient has prior history of Echocardiogram examinations, most                 recent 09/03/2021. Stroke; Risk Factors:Former Smoker,                 Hypertension and Diabetes.  Sonographer:    Merrie Roof RDCS Referring Phys: [id number] [place]  1. Left ventricular ejection fraction, by estimation, is 60 to 65%. The left ventricle has normal function. The left ventricle has no regional wall motion abnormalities. Left ventricular diastolic parameters are consistent with Grade I diastolic dysfunction (impaired relaxation).  2. Right ventricular systolic function is normal. The right ventricular size is normal. There is normal pulmonary artery systolic pressure.  3. Large circumferential pericardial effusion. Increased in since since 08/2021. Large pericardial effusion. The pericardial effusion is circumferential. There is no  evidence of cardiac tamponade.  4. The mitral valve is normal in structure. No evidence of mitral valve regurgitation. No evidence of mitral stenosis.  5. The aortic valve is tricuspid. Aortic valve regurgitation is not visualized. No aortic stenosis is present.  6. The inferior vena cava is normal in size with greater than 50% respiratory variability, suggesting right atrial pressure of 3 mmHg. FINDINGS  Left Ventricle: Left ventricular ejection fraction, by estimation, is 60 to 65%. The left ventricle has normal function. The left ventricle has no regional wall motion abnormalities. The left ventricular internal cavity size was normal in size. There is  no left ventricular hypertrophy. Left ventricular diastolic parameters are consistent with Grade I diastolic dysfunction (impaired relaxation). Right  Ventricle: The right ventricular size is normal. No increase in right ventricular wall thickness. Right ventricular systolic function is normal. There is normal pulmonary artery systolic pressure. The tricuspid regurgitant velocity is 1.62 m/s, and  with an assumed right atrial pressure of 3 mmHg, the estimated right ventricular systolic pressure is 31.4 mmHg. Left Atrium: Left atrial size was normal in size. Right Atrium: Right atrial size was normal in size. Pericardium: Large circumferential pericardial effusion. Increased in since since 08/2021. A large pericardial effusion is present. The pericardial effusion is circumferential. There is diastolic collapse of the right ventricular free wall. There is no evidence of cardiac tamponade. Presence of epicardial fat layer. Mitral Valve: The mitral valve is normal in structure. No evidence of mitral valve regurgitation. No evidence of mitral valve stenosis. Tricuspid Valve: The tricuspid valve is normal in structure. Tricuspid valve regurgitation is trivial. No evidence of tricuspid stenosis. Aortic Valve: The aortic valve is tricuspid. Aortic valve regurgitation is not visualized. No aortic stenosis is present. Aortic valve mean gradient measures 4.0 mmHg. Aortic valve peak gradient measures 6.9 mmHg. Aortic valve area, by VTI measures 2.67 cm. Pulmonic Valve: The pulmonic valve was normal in structure. Pulmonic valve regurgitation is not visualized. No evidence of pulmonic stenosis. Aorta: The aortic root is normal in size and structure. Venous: The inferior vena cava is normal in size with greater than 50% respiratory variability, suggesting right atrial pressure of 3 mmHg. IAS/Shunts: No atrial level shunt detected by color flow Doppler.  LEFT VENTRICLE PLAX 2D LVIDd:         3.60 cm   Diastology LVIDs:         2.40 cm   LV e' medial:    6.76 cm/s LV PW:         0.90 cm   LV E/e' medial:  10.7 LV IVS:        1.10 cm   LV e' lateral:   7.42 cm/s LVOT diam:      1.80 cm   LV E/e' lateral: 9.8 LV SV:         51 LV SV Index:   31 LVOT Area:     2.54 cm  RIGHT VENTRICLE          IVC RV Basal diam:  3.00 cm  IVC diam: 1.50 cm LEFT ATRIUM             Index        RIGHT ATRIUM           Index LA diam:        3.20 cm 1.94 cm/m   RA Area:     16.50 cm LA Vol (A2C):   41.5 ml 25.22 ml/m  RA Volume:   46.40 ml  28.19 ml/m LA Vol (A4C):   43.9 ml 26.67 ml/m LA Biplane Vol: 44.1 ml 26.80 ml/m  AORTIC VALVE AV Area (Vmax):    2.39 cm AV Area (Vmean):   2.31 cm AV Area (VTI):     2.67 cm AV Vmax:           131.00 cm/s AV Vmean:          90.100 cm/s AV VTI:            0.190 m AV Peak Grad:      6.9 mmHg AV Mean Grad:      4.0 mmHg LVOT Vmax:         123.00 cm/s LVOT Vmean:        81.700 cm/s LVOT VTI:          0.199 m LVOT/AV VTI ratio: 1.05  AORTA Ao Root diam: 2.80 cm Ao Asc diam:  2.90 cm MITRAL VALVE                TRICUSPID VALVE MV Area (PHT): 4.57 cm     TR Peak grad:   10.5 mmHg MV Decel Time: 166 msec     TR Vmax:        162.00 cm/s MV E velocity: 72.60 cm/s MV A velocity: 117.00 cm/s  SHUNTS MV E/A ratio:  0.62         Systemic VTI:  0.20 m                             Systemic Diam: 1.80 cm Skeet Latch MD Electronically signed by Skeet Latch MD Signature Date/Time: 11/05/2021/4:34:25 PM    Final         Scheduled Meds:  atorvastatin  80 mg Oral QHS   calcium-vitamin D  1 tablet Oral BID   colchicine  0.6 mg Oral Daily   diltiazem  120 mg Oral Daily   divalproex  1,000 mg Oral QPM   divalproex  250 mg Oral Daily   insulin aspart  0-5 Units Subcutaneous QHS   insulin aspart  0-9 Units Subcutaneous TID WC   levothyroxine  200 mcg Oral QAC breakfast   mirtazapine  15 mg Oral QHS   pantoprazole  40 mg Oral QAC breakfast   PARoxetine  40 mg Oral Daily   QUEtiapine  100 mg Oral QHS   sodium chloride flush  3 mL Intravenous Q12H   cyanocobalamin  2,000 mcg Oral Daily   Continuous Infusions:  sodium chloride     cefTRIAXone (ROCEPHIN)  IV 1 g  (11/05/21 2212)     LOS: 1 day    Time spent: 32 minutes    Barb Merino, MD [facility] Pager [phone]

## 2021-11-06 NOTE — Significant Event (Signed)
Patient is in route to OR for Pericardial procedure. Baseline vitals called CCMD.

## 2021-11-06 NOTE — Progress Notes (Addendum)
Heart Failure Nurse Navigator Progress Note  Following this hospitalization to assess for HV TOC readiness. Pt receiving IV lasix for suspects dHF. Large circumferential pericardial effusion note on updated ECHO, no evidence of tamponade. Pt confused, has UTI and was initially hypoxic. Recent stroke. From [facility].  Pt remains confused, able to state name/DOB, and city but not building, just continues to repeat "[place], [place]".   Will follow to determine benefits pt would gain from HV TOC clinic---awaiting to see if confusion clears.   EF: 60-65%, G1DD, large pericardial effusion (increased in size from 08/2021).   Pricilla Holm, MSN, RN Heart Failure Nurse Navigator [phone]

## 2021-11-06 NOTE — Progress Notes (Signed)
°  Echocardiogram 2D Echocardiogram has been performed.  Claudia Keller 11/06/2021, 5:31 PM

## 2021-11-06 NOTE — H&P (View-Only) (Signed)
Cardiology Consultation:   Patient ID: PHINLEY SCHALL MRN: [id number]; DOB: 02-27-1951  Admit date: 11/04/2021 Date of Consult: 11/06/2021  PCP:  [facility] Cardiologist:  None      Dr Domenic Polite saw in 2012  Patient Profile:   LABRITTANY WECHTER is a 70 y.o. female with a hx of PAF on Eliquis, hypothyroid, COPD, DM2, HTN, IBS, HLD, OA, who is being seen 11/06/2021 for the evaluation of pericardial effusion, at the request of Dr Sloan Leiter.  History of Present Illness:   Ms. Montante was last seen by Dr Domenic Polite in 2012 and had a nonischemic MV with an EF of greater than 65%.  Admitted 1/24 - 12/03/2020 with COVID, CBGs greater than 400, was treated for acute hypoxemic respiratory failure.  Admitted 1/30-12/12/2020 for acute hypoxemic respiratory failure secondary to COVID-pneumonia.  She was discharged on 2 L of oxygen.  She had transient atrial fibrillation that was treated with diltiazem and converted to sinus rhythm.  No additional atrial fibrillation was seen during that admission.  The discharge summary states that if she had any recurrence, would need to consider anticoagulation.  She was admitted 10/26-10/28/2022 with a CVA, discharged to SNF.  Initially, a TEE and monitor recommended, but since she had been previously diagnosed with atrial fibrillation, she was just started on anticoagulation.  Pericardial effusion seen on echo.  She came to the ER 12/28 with confusion and hypoxemia.  Her O2 sats were 86-87% on room air.  Sodium was 146 at the facility.  CT negative for PE but showed a pericardial effusion.  Sodium here 136, BUN 63, creatinine 1.25, COVID-negative, UA abnormal and culture pending.  Pericardial effusion is increased in size and is now large, Cards asked to see.  Ms. Besse has been getting gradually weaker and weaker.  P.o. intake has been decreasing.  2 months ago, she weighed about 70 kg.  Now, she weighs 65  kg.  She has had no acute illness or injury.  She denies presyncope or presyncope.  No palpitations, if she has had more atrial fibrillation, she is not aware of it.  She was sent to the hospital because of decreased p.o. intake, altered mental status, and problems with speech.  She is oriented to name and place.  She is able to answer questions with one-word answers, but does not elaborate.  There are no records available to review, but according to the family, her activity level is greatly decreased.  Palliative care has been called to see her and and their NP is in the room now.  Currently, she is a full code.   Past Medical History:  Diagnosis Date   Arthritis    COPD (chronic obstructive pulmonary disease) ([place])    Diverticulosis    Essential hypertension, benign    Hx of colonic polyps    Hypothyroidism    IBS (irritable bowel syndrome)    Irritable bowel syndrome    Ketoacidosis    Mixed hyperlipidemia    PTSD (post-traumatic stress disorder)    S/P colonoscopy Jan 2011   RMR: pancolonic diverticula, polypoid rectal mucosa with  prominent lymphoid aggregates, repeat in Jan 2016    Shortness of breath dyspnea    Stress incontinence, female    Thyroid disease    Tobacco use    Type 2 diabetes mellitus ([place])     Past Surgical History:  Procedure Laterality Date   Bilateral tubal ligation  BREAST BIOPSY Left 10/16/2014   negative   CHOLECYSTECTOMY  09/11/2012   Procedure: LAPAROSCOPIC CHOLECYSTECTOMY;  Surgeon: Jamesetta So, MD;  Location: [facility];  Service: General;  Laterality: N/A;   COLONOSCOPY  11/17/2009   Dr. Gala Romney: normal rectum, pancolonic diverticula, polyps benign, no adenomas.    COLONOSCOPY N/A 02/12/2015   Dr. Gala Romney: colonic diverticulosis, multiple colonic polyps (tubular adenomas), negative segmental biopsies. Surveillance in 2021   ESOPHAGOGASTRODUODENOSCOPY  11/23/2011   Dr. Gala Romney: Erosive reflux esophagitis; small hiatal hernia; esophagus dilated  empirically   MALONEY DILATION  11/23/2011   Procedure: Venia Minks DILATION;  Surgeon: Daneil Dolin, MD;  Location: [facility];  Service: Endoscopy;  Laterality: N/A;   SKIN LESION EXCISION     on buttocks   TUBAL LIGATION       Home Medications:  Prior to Admission medications   Medication Sig Start Date End Date Taking? Authorizing Provider  acetaminophen (TYLENOL) 325 MG tablet Take 650 mg by mouth every 5 (five) hours as needed for mild pain.   Yes [provider]  albuterol (VENTOLIN HFA) 108 (90 Base) MCG/ACT inhaler Inhale 2 puffs into the lungs every 4 (four) hours as needed for wheezing or shortness of breath. 12/03/20  Yes Manuella Ghazi, Pratik D, DO  apixaban (ELIQUIS) 5 MG TABS tablet Take 1 tablet (5 mg total) by mouth 2 (two) times daily. 09/04/21  Yes Johnson, Clanford L, MD  aspirin 81 MG chewable tablet Chew 81 mg by mouth daily.   Yes [provider]  atorvastatin (LIPITOR) 80 MG tablet Take 1 tablet (80 mg total) by mouth at bedtime. 09/04/21  Yes Johnson, Clanford L, MD  bisacodyl (DULCOLAX) 10 MG suppository Place 10 mg rectally daily as needed for moderate constipation or severe constipation. Use only if Milk of Magnesia does not work as needed every 24 hours.   Yes [provider]  Calcium Carb-Cholecalciferol (OYSTER SHELL CALCIUM W/D) 500-200 MG-UNIT TABS Take 1 tablet by mouth 2 (two) times daily. 11/10/20  Yes [provider]  cetirizine (ZYRTEC) 10 MG tablet Take 10 mg by mouth daily.   Yes [provider]  colestipol (COLESTID) 1 g tablet TAKE 2 TABLETS DAILY FOR DIARRHEA. DO NOT TAKE WITHIN 2 HOURS OF OTHER ORAL MEDICATIONS. HOLD FOR CONSTIPATION. Patient taking differently: Take 2 g by mouth daily. TAKE 2 TABLETS DAILY FOR DIARRHEA. DO NOT TAKE WITHIN 2 HOURS OF OTHER ORAL MEDICATIONS. HOLD FOR CONSTIPATION. 05/23/20  Yes Carlis Stable, NP  Continuous Blood Gluc Sensor (FREESTYLE LIBRE 2 SENSOR) MISC 1 Device by Does not apply route  every 14 (fourteen) days. 08/27/21  Yes Renato Shin, MD  cyanocobalamin ([employer] VITAMIN B12) 2000 MCG tablet Take 1 tablet (2,000 mcg total) by mouth daily. 09/04/21  Yes Johnson, Clanford L, MD  diltiazem (CARDIZEM CD) 120 MG 24 hr capsule Take 1 capsule (120 mg total) by mouth daily. 12/12/20 12/12/21 Yes Kathie Dike, MD  divalproex (DEPAKOTE ER) 500 MG 24 hr tablet Take 2 tablets (1,000 mg total) by mouth every evening. 02/20/21  Yes Norman Clay, MD  divalproex (DEPAKOTE) 250 MG DR tablet Take 1 tablet (250 mg total) by mouth daily. 02/20/21  Yes Hisada, Elie Goody, MD  furosemide (LASIX) 20 MG tablet Take 1 tablet (20 mg total) by mouth every other day. 09/07/21  Yes Johnson, Clanford L, MD  glucose blood (QUINTET BLOOD GLUCOSE TEST) test strip Check blood sugar 2 times daily E11.65 08/12/21  Yes Renato Shin, MD  insulin detemir (  LEVEMIR) 100 UNIT/ML injection Inject 0.3 mLs (30 Units total) into the skin every morning. Patient taking differently: Inject 30 Units into the skin daily. 09/15/21  Yes Renato Shin, MD  levothyroxine (SYNTHROID) 200 MCG tablet Take 200 mcg by mouth daily before breakfast.   Yes [provider]  lisinopril (ZESTRIL) 40 MG tablet Take 40 mg by mouth daily.   Yes [provider]  magnesium hydroxide (MILK OF MAGNESIA) 400 MG/5ML suspension Take 30 mLs by mouth daily as needed for mild constipation or moderate constipation. If no BM in 3 days, take 30cc every 24 hours PRN   Yes [provider]  mirtazapine (REMERON) 15 MG tablet Take 15 mg by mouth at bedtime.   Yes [provider]  nicotine polacrilex (NICORETTE) 4 MG gum Take 4 mg by mouth every 2 (two) hours as needed for smoking cessation. **avoid food/drink for 15 minutes before and after**   Yes [provider]  ondansetron (ZOFRAN) 4 MG tablet Take 1 tablet (4 mg total) by mouth every 8 (eight) hours as needed for nausea or vomiting. 10/17/18  Yes Walden Field A, NP   pantoprazole (PROTONIX) 40 MG tablet TAKE 1 TABLET BY MOUTH ONCE DAILY 30 MINTUES BEFORE BREAKFAST. Patient taking differently: Take 40 mg by mouth daily before breakfast. 05/23/20  Yes Carlis Stable, NP  PARoxetine (PAXIL) 40 MG tablet Take 1 tablet (40 mg total) by mouth daily. 02/20/21  Yes Norman Clay, MD  QUEtiapine (SEROQUEL) 100 MG tablet Take 1 tablet (100 mg total) by mouth at bedtime. 02/20/21  Yes Hisada, Elie Goody, MD  senna-docusate (SENOKOT-S) 8.6-50 MG tablet Take 1 tablet by mouth at bedtime as needed for mild constipation. 09/04/21  Yes Johnson, Clanford L, MD  Sodium Phosphates (RA SALINE ENEMA RE) Place 1 Dose rectally once as needed (severe constipation). If no relief from both milk of magnesia, and Bisacodyl suppository, administer one dose of disposable saline enema.   Yes [provider]    Inpatient Medications: Scheduled Meds:  atorvastatin  80 mg Oral QHS   calcium-vitamin D  1 tablet Oral BID   colchicine  0.6 mg Oral Daily   diltiazem  120 mg Oral Daily   divalproex  1,000 mg Oral QPM   divalproex  250 mg Oral Daily   insulin aspart  0-5 Units Subcutaneous QHS   insulin aspart  0-9 Units Subcutaneous TID WC   levothyroxine  200 mcg Oral QAC breakfast   mirtazapine  15 mg Oral QHS   pantoprazole  40 mg Oral QAC breakfast   PARoxetine  40 mg Oral Daily   QUEtiapine  100 mg Oral QHS   sodium chloride flush  3 mL Intravenous Q12H   cyanocobalamin  2,000 mcg Oral Daily   Continuous Infusions:  sodium chloride     cefTRIAXone (ROCEPHIN)  IV 1 g (11/05/21 2212)   PRN Meds: sodium chloride, acetaminophen **OR** acetaminophen, albuterol, senna-docusate, sodium chloride flush  Allergies:   No Known Allergies  Social History:   Social History   Socioeconomic History   Marital status: Legally Separated    Spouse name: Not on file   Number of children: Not on file   Years of education: Not on file   Highest education level: Not on file  Occupational  History   Not on file  Tobacco Use   Smoking status: Every Day    Packs/day: 2.00    Years: 43.00    Pack years: 86.00    Types:  Cigarettes   Smokeless tobacco: Never  Substance and Sexual Activity   Alcohol use: No    Alcohol/week: 0.0 standard drinks   Drug use: No   Sexual activity: Not Currently    Birth control/protection: Surgical, Post-menopausal    Comment: tubal  Other Topics Concern   Not on file  Social History Narrative   Not on file   Social Determinants of Health   Financial Resource Strain: Not on file  Food Insecurity: No Food Insecurity   Worried About Running Out of Food in the Last Year: Never true   Ran Out of Food in the Last Year: Never true  Transportation Needs: No Transportation Needs   Lack of Transportation (Medical): No   Lack of Transportation (Non-Medical): No  Physical Activity: Not on file  Stress: Not on file  Social Connections: Not on file  Intimate Partner Violence: Not on file    Family History:   Family History  Problem Relation Age of Onset   Cancer Mother        unsure what type   Heart disease Father    Heart attack Father    Schizophrenia Brother    Stroke Brother    Cancer Son        bladder   Coronary artery disease Other    Diabetes type II Other    Colon cancer Neg Hx    Breast cancer Neg Hx      ROS:  Please see the history of present illness.  All other ROS reviewed and negative.     Physical Exam/Data:   Vitals:   11/06/21 0021 11/06/21 0355 11/06/21 0752 11/06/21 1102  BP: 123/66 (!) 143/70 (!) 110/53 135/65  Pulse: 85 90 75 86  Resp: 18 18 18 17   Temp: 98.2 F (36.8 C) 97.7 F (36.5 C) 97.8 F (36.6 C) 97.8 F (36.6 C)  TempSrc: Oral Oral Oral Oral  SpO2: 92% 93% 93% 92%  Weight:  65.1 kg    Height:        Intake/Output Summary (Last 24 hours) at 11/06/2021 1524 Last data filed at 11/06/2021 1101 Gross per 24 hour  Intake 580 ml  Output 1750 ml  Net -1170 ml   Last 3 Weights 11/06/2021  11/05/2021 10/09/2021  Weight (lbs) 143 lb 8.3 oz 135 lb 139 lb 6.4 oz  Weight (kg) 65.1 kg 61.236 kg 63.231 kg     Body mass index is 25.02 kg/m.  General: Frail, elderly female who appears older than her stated age, in no acute distress HEENT: normal for age Neck: no JVD Vascular: No carotid bruits; Distal pulses 2+ bilaterally Cardiac:  normal S1, S2; RRR; 2/6 murmur  Lungs:  clear to auscultation bilaterally, no wheezing, rhonchi or rales  Abd: soft, nontender, no hepatomegaly  Ext: no edema Musculoskeletal:  No deformities, BUE and BLE strength very weak but equal Skin: warm and dry  Neuro:  CNs 2-12 intact, no focal abnormalities noted Psych:  Normal affect   EKG:  The EKG was personally reviewed and demonstrates: Sinus rhythm, heart rate 96, diffuse T wave flattening, no significant change from 12/08/2020 Telemetry:  Telemetry was personally reviewed and demonstrates: Sinus rhythm  Relevant CV Studies:  ECHO: 11/06/2021  1. Left ventricular ejection fraction, by estimation, is 60 to 65%. The left ventricle has normal function. The left ventricle has no regional wall motion abnormalities. Left ventricular diastolic parameters are consistent with Grade I diastolic  dysfunction (impaired relaxation).   2. Right ventricular  systolic function is normal. The right ventricular size is normal. There is normal pulmonary artery systolic pressure.   3. Large circumferential pericardial effusion. Increased in since since 08/2021. Large pericardial effusion. The pericardial effusion is circumferential. There is no evidence of cardiac tamponade.   4. The mitral valve is normal in structure. No evidence of mitral valve regurgitation. No evidence of mitral stenosis.   5. The aortic valve is tricuspid. Aortic valve regurgitation is not  visualized. No aortic stenosis is present.   6. The inferior vena cava is normal in size with greater than 50%  respiratory variability, suggesting right atrial  pressure of 3 mmHg.   ECHO: 08/29/2021  1. Left ventricular ejection fraction, by estimation, is 65 to 70%. The left ventricle has normal function. The left ventricle has no regional wall motion abnormalities. There is mild left ventricular hypertrophy. Left ventricular diastolic parameters  are consistent with Grade I diastolic dysfunction (impaired relaxation).   2. Right ventricular systolic function is normal. The right ventricular  size is normal. There is normal pulmonary artery systolic pressure.   3. A small pericardial effusion is present. The pericardial effusion is  circumferential.   4. The mitral valve is normal in structure. No evidence of mitral valve regurgitation. No evidence of mitral stenosis.   5. The aortic valve is tricuspid. Aortic valve regurgitation is not  visualized. No aortic stenosis is present.   6. The inferior vena cava is normal in size with greater than 50%  respiratory variability, suggesting right atrial pressure of 3 mmHg.   MYOVIEW: 2012 Stress Data: Treadmill exercise performed to a workload of 4.6 mets and a heart rate of 131, 80% of age predicted maximum.  Exercise discontinued due to dyspnea and fatigue; no chest discomfort reported.  Blood pressure increased from a resting value of 130/70 to 140/80 in recovery, probably a normal response, as intervening measurements were not obtained.  No arrhythmias noted.   EKG: Normal sinus rhythm; nonspecific ST-segment abnormality.  Stress EKG:  Interpretation is somewhat impaired by artifact; no  significant abnormalities identified.   Scintigraphic Data: Acquisition notable for moderate breast  attenuation.  Left ventricular size was normal.  On tomographic  images reconstructed in standard planes, there was a very small  focal septal defect of mild intensity for which no reversibility  was appreciated.  A second very small defect in the mid and distal  inferolateral lateral segment extending apically of  borderline  numeric significance with minor reversibility was also noted.  The  gated reconstruction demonstrated normal to hyperdynamic regional and global LV systolic function as well as normal systolic  accentuation of activity throughout.  Estimated ejection fraction  exceeded 65%.   IMPRESSION:  Probably negative stress nuclear myocardial study revealing  impaired exercise capacity, normal left ventricular size, normal  left ventricular systolic function and no significant stress-  induced EKG abnormalities.  By scintigraphic imaging, there were  two areas of abnormal tracer uptake possibly representing breast  attenuation and a right ventricular insertion artifact.  Small  areas of scarring cannot be unequivocally excluded.  No convincing evidence for significant ischemia.  Other findings as noted.   Laboratory Data:  High Sensitivity Troponin:   Recent Labs  Lab 11/04/21 0034 11/05/21 0135 11/05/21 0425 11/05/21 0700  TROPONINIHS 12 12 12 9      Chemistry Recent Labs  Lab 11/04/21 1959 11/04/21 2006 11/05/21 0425 11/06/21 0319  NA 136 139 133* 136  K 4.6 4.5 4.6 4.1  CL  99  --  96* 97*  CO2 28  --  26 31  GLUCOSE 164*  --  210* 172*  BUN 63*  --  54* 44*  CREATININE 1.25*  --  1.05* 0.99  CALCIUM 10.1  --  9.1 10.3  MG  --   --   --  1.7  GFRNONAA 47*  --  58* >60  ANIONGAP 9  --  11 8    Recent Labs  Lab 11/04/21 1959 11/05/21 0425  PROT 6.2* 5.5*  ALBUMIN 2.4* 2.2*  AST 42* 44*  ALT 25 27  ALKPHOS 70 61  BILITOT 0.7 1.1   Lipids No results for input(s): CHOL, TRIG, HDL, LABVLDL, LDLCALC, CHOLHDL in the last 168 hours.  Hematology Recent Labs  Lab 11/04/21 1959 11/04/21 2006 11/05/21 0425 11/06/21 0319  WBC 7.5  --  7.6 7.1  RBC 4.00  --  3.36* 3.59*  HGB 12.6 11.6* 10.7* 11.3*  HCT 38.1 34.0* 32.3* 33.3*  MCV 95.3  --  96.1 92.8  MCH 31.5  --  31.8 31.5  MCHC 33.1  --  33.1 33.9  RDW 15.5  --  15.1 14.8  PLT 290  --  272 264   Thyroid   Recent Labs  Lab 11/05/21 0425  TSH 0.621    BNP Recent Labs  Lab 11/04/21 1959  BNP 247.7*    DDimer No results for input(s): DDIMER in the last 168 hours.   Radiology/Studies:  DG Chest 1 View  Result Date: 11/04/2021 CLINICAL DATA:  Altered mental status. EXAM: CHEST  1 VIEW COMPARISON:  September 02, 2021 FINDINGS: Mild to moderate severity infiltrate is seen within the left lung base. There is no evidence of a pleural effusion or pneumothorax. There is moderate to marked severity enlargement of the cardiac silhouette. This is increased in size when compared to the prior study, which may be technical in nature. Moderate to marked severity calcification of the aortic arch is noted. The visualized skeletal structures are unremarkable. IMPRESSION: Moderate to marked severity cardiomegaly with mild to moderate severity left basilar infiltrate. Electronically Signed   By: Virgina Norfolk M.D.   On: 11/04/2021 20:12   CT HEAD WO CONTRAST (5MM)  Result Date: 11/04/2021 CLINICAL DATA:  Altered level of consciousness for 2 days EXAM: CT HEAD WITHOUT CONTRAST TECHNIQUE: Contiguous axial images were obtained from the base of the skull through the vertex without intravenous contrast. COMPARISON:  09/03/2021, 09/02/2021 FINDINGS: Brain: Chronic ischemic changes are seen within the bilateral basal ganglia, right greater than left. No evidence of acute infarct or hemorrhage. Lateral ventricles and remaining midline structures are unremarkable. No acute extra-axial fluid collections. No mass effect. Vascular: No hyperdense vessel or unexpected calcification. Skull: Normal. Negative for fracture or focal lesion. Sinuses/Orbits: No acute finding. Other: None. IMPRESSION: 1. Chronic small vessel ischemic changes within the basal ganglia. No acute intracranial process. Electronically Signed   By: Randa Ngo M.D.   On: 11/04/2021 19:47   CT Angio Chest PE W and/or Wo Contrast  Result Date:  11/04/2021 CLINICAL DATA:  Hypoxia, abdominal pain EXAM: CT ANGIOGRAPHY CHEST CT ABDOMEN AND PELVIS WITH CONTRAST TECHNIQUE: Multidetector CT imaging of the chest was performed using the standard protocol during bolus administration of intravenous contrast. Multiplanar CT image reconstructions and MIPs were obtained to evaluate the vascular anatomy. Multidetector CT imaging of the abdomen and pelvis was performed using the standard protocol during bolus administration of intravenous contrast. CONTRAST:  121mL OMNIPAQUE IOHEXOL 350  MG/ML SOLN COMPARISON:  11/04/2021, 03/18/2020 FINDINGS: CTA CHEST FINDINGS Cardiovascular: This is a technically adequate evaluation of the pulmonary vasculature. No filling defects or pulmonary emboli. There is a large pericardial effusion measuring up to 2.8 cm in thickness. There is subtle pericardial enhancement, concerning for exudative effusion. No evidence of thoracic aortic aneurysm or dissection. Moderate atherosclerosis of the aorta and coronary vasculature. Mediastinum/Nodes: No enlarged mediastinal, hilar, or axillary lymph nodes. Thyroid gland, trachea, and esophagus demonstrate no significant findings. Lungs/Pleura: Small left pleural effusion with left lower lobe compressive atelectasis. Mild bilateral perihilar interstitial and ground-glass opacities consistent with mild edema. No pneumothorax. Central airways are patent. Musculoskeletal: No acute or destructive bony lesions. Reconstructed images demonstrate no additional findings. Review of the MIP images confirms the above findings. CT ABDOMEN and PELVIS FINDINGS Hepatobiliary: Reflux of contrast into the hepatic veins suggests cardiac dysfunction. No focal liver abnormality. Gallbladder is surgically absent. Pancreas: Unremarkable. No pancreatic ductal dilatation or surrounding inflammatory changes. Spleen: Normal in size without focal abnormality. Adrenals/Urinary Tract: Adrenal glands are unremarkable. Kidneys are  normal, without renal calculi, focal lesion, or hydronephrosis. Bladder is unremarkable. Stomach/Bowel: No bowel obstruction or ileus. Normal appendix right lower quadrant. Mild wall thickening involving the distal transverse and descending colon is nonspecific given decompressed state, and underlying colitis cannot be excluded. No associated inflammatory change. Vascular/Lymphatic: Significant atherosclerosis of the aorta, with focal high-grade stenosis of the distal aorta just proximal to the bifurcation. Please correlate with any symptoms of claudication. No pathologic adenopathy within the abdomen or pelvis. Reproductive: Uterus and bilateral adnexa are unremarkable. Other: No free fluid or free gas.  No abdominal wall hernia. Musculoskeletal: No acute or destructive bony lesions. Prominent spondylosis at L5-S1. Reconstructed images demonstrate no additional findings. Review of the MIP images confirms the above findings. IMPRESSION: 1. Large pericardial effusion, with pericardial enhancement concerning for pericarditis and exudative effusion. 2. Mild perihilar edema, small left pleural effusion, and reflux of contrast into the hepatic veins may reflect an element of constrictive pericarditis. Correlation with echocardiography is recommended. 3. No evidence of pulmonary embolus. 4. Nonspecific segmental distal colonic wall thickening. While this could be due to decompressed state, mild colitis cannot be excluded. 5. Aortic Atherosclerosis (ICD10-I70.0). High-grade stenosis at the aortic bifurcation from heavily calcified atheromatous plaque unchanged. Please correlate with any symptoms of lower extremity claudication. Electronically Signed   By: Randa Ngo M.D.   On: 11/04/2021 23:15   MR BRAIN WO CONTRAST  Result Date: 11/05/2021 CLINICAL DATA:  Altered mental status, recent history of multiple territory strokes EXAM: MRI HEAD WITHOUT CONTRAST TECHNIQUE: Multiplanar, multiecho pulse sequences of the  brain and surrounding structures were obtained without intravenous contrast. COMPARISON:  CT head dated 1 day prior, MR head 09/03/2021 FINDINGS: Brain: There is faintly elevated DWI signal in the posterior limb of the right internal capsule and lentiform nucleus without convincing low ADC signal. There is associated FLAIR signal abnormality. This signal abnormality was present on the brain MRI from 09/03/2021 and likely reflects late subacute infarct. Curvilinear SWI signal dropout in this region likely reflects petechial blood products. There is an additional faint focus of diffusion restriction in the posterior limb of the left internal capsule with mildly low ADC signal and FLAIR signal abnormality which was not present on the prior brain MRI, likely reflecting more recent early subacute stroke. There is no evidence of associated hemorrhage. There is no other evidence of acute infarct. There is no evidence of acute intracranial hemorrhage or extra-axial fluid  collection. There is unchanged mild global parenchymal volume loss with prominence of the ventricular system and extra-axial CSF spaces. Additional foci of FLAIR signal abnormality in the subcortical and periventricular white matter likely reflect sequela of chronic white matter microangiopathy. A few additional remote lacunar infarcts are unchanged. There is no solid mass lesion.  There is no midline shift. Vascular: Normal flow voids. Skull and upper cervical spine: Normal marrow signal. Sinuses/Orbits: The paranasal sinuses are clear. The globes and orbits are unremarkable. Other: There are bilateral mastoid effusions, unchanged. IMPRESSION: 1. Small infarct in the left internal capsule is new since the prior brain MRI of 09/03/2021, likely subacute in chronicity. 2. Evolving late subacute to early chronic infarcts in the right internal capsule/basal ganglia. 3. No other acute infarct or other acute intracranial pathology. Electronically Signed   By:  Valetta Mole M.D.   On: 11/05/2021 12:27   CT ABDOMEN PELVIS W CONTRAST  Result Date: 11/04/2021 CLINICAL DATA:  Hypoxia, abdominal pain EXAM: CT ANGIOGRAPHY CHEST CT ABDOMEN AND PELVIS WITH CONTRAST TECHNIQUE: Multidetector CT imaging of the chest was performed using the standard protocol during bolus administration of intravenous contrast. Multiplanar CT image reconstructions and MIPs were obtained to evaluate the vascular anatomy. Multidetector CT imaging of the abdomen and pelvis was performed using the standard protocol during bolus administration of intravenous contrast. CONTRAST:  162mL OMNIPAQUE IOHEXOL 350 MG/ML SOLN COMPARISON:  11/04/2021, 03/18/2020 FINDINGS: CTA CHEST FINDINGS Cardiovascular: This is a technically adequate evaluation of the pulmonary vasculature. No filling defects or pulmonary emboli. There is a large pericardial effusion measuring up to 2.8 cm in thickness. There is subtle pericardial enhancement, concerning for exudative effusion. No evidence of thoracic aortic aneurysm or dissection. Moderate atherosclerosis of the aorta and coronary vasculature. Mediastinum/Nodes: No enlarged mediastinal, hilar, or axillary lymph nodes. Thyroid gland, trachea, and esophagus demonstrate no significant findings. Lungs/Pleura: Small left pleural effusion with left lower lobe compressive atelectasis. Mild bilateral perihilar interstitial and ground-glass opacities consistent with mild edema. No pneumothorax. Central airways are patent. Musculoskeletal: No acute or destructive bony lesions. Reconstructed images demonstrate no additional findings. Review of the MIP images confirms the above findings. CT ABDOMEN and PELVIS FINDINGS Hepatobiliary: Reflux of contrast into the hepatic veins suggests cardiac dysfunction. No focal liver abnormality. Gallbladder is surgically absent. Pancreas: Unremarkable. No pancreatic ductal dilatation or surrounding inflammatory changes. Spleen: Normal in size without  focal abnormality. Adrenals/Urinary Tract: Adrenal glands are unremarkable. Kidneys are normal, without renal calculi, focal lesion, or hydronephrosis. Bladder is unremarkable. Stomach/Bowel: No bowel obstruction or ileus. Normal appendix right lower quadrant. Mild wall thickening involving the distal transverse and descending colon is nonspecific given decompressed state, and underlying colitis cannot be excluded. No associated inflammatory change. Vascular/Lymphatic: Significant atherosclerosis of the aorta, with focal high-grade stenosis of the distal aorta just proximal to the bifurcation. Please correlate with any symptoms of claudication. No pathologic adenopathy within the abdomen or pelvis. Reproductive: Uterus and bilateral adnexa are unremarkable. Other: No free fluid or free gas.  No abdominal wall hernia. Musculoskeletal: No acute or destructive bony lesions. Prominent spondylosis at L5-S1. Reconstructed images demonstrate no additional findings. Review of the MIP images confirms the above findings. IMPRESSION: 1. Large pericardial effusion, with pericardial enhancement concerning for pericarditis and exudative effusion. 2. Mild perihilar edema, small left pleural effusion, and reflux of contrast into the hepatic veins may reflect an element of constrictive pericarditis. Correlation with echocardiography is recommended. 3. No evidence of pulmonary embolus. 4. Nonspecific segmental distal colonic wall  thickening. While this could be due to decompressed state, mild colitis cannot be excluded. 5. Aortic Atherosclerosis (ICD10-I70.0). High-grade stenosis at the aortic bifurcation from heavily calcified atheromatous plaque unchanged. Please correlate with any symptoms of lower extremity claudication. Electronically Signed   By: Randa Ngo M.D.   On: 11/04/2021 23:15   ECHOCARDIOGRAM COMPLETE  Result Date: 11/05/2021    ECHOCARDIOGRAM REPORT   Patient Name:   LEIGHTYN CINA Date of Exam: 11/05/2021  Medical Rec #:  [id number]      Height:       63.5 in Accession #:    [id number]     Weight:       135.0 lb Date of Birth:  May 08, 1951      BSA:          1.646 m Patient Age:    7 years       BP:           160/88 mmHg Patient Gender: F              HR:           94 bpm. Exam Location:  Inpatient Procedure: 2D Echo, Cardiac Doppler and Color Doppler Indications:    Stroke  History:        Patient has prior history of Echocardiogram examinations, most                 recent 09/03/2021. Stroke; Risk Factors:Former Smoker,                 Hypertension and Diabetes.  Sonographer:    Merrie Roof RDCS Referring Phys: [id number] [place]  1. Left ventricular ejection fraction, by estimation, is 60 to 65%. The left ventricle has normal function. The left ventricle has no regional wall motion abnormalities. Left ventricular diastolic parameters are consistent with Grade I diastolic dysfunction (impaired relaxation).  2. Right ventricular systolic function is normal. The right ventricular size is normal. There is normal pulmonary artery systolic pressure.  3. Large circumferential pericardial effusion. Increased in since since 08/2021. Large pericardial effusion. The pericardial effusion is circumferential. There is no evidence of cardiac tamponade.  4. The mitral valve is normal in structure. No evidence of mitral valve regurgitation. No evidence of mitral stenosis.  5. The aortic valve is tricuspid. Aortic valve regurgitation is not visualized. No aortic stenosis is present.  6. The inferior vena cava is normal in size with greater than 50% respiratory variability, suggesting right atrial pressure of 3 mmHg. FINDINGS  Left Ventricle: Left ventricular ejection fraction, by estimation, is 60 to 65%. The left ventricle has normal function. The left ventricle has no regional wall motion abnormalities. The left ventricular internal cavity size was normal in size. There is  no left ventricular hypertrophy. Left  ventricular diastolic parameters are consistent with Grade I diastolic dysfunction (impaired relaxation). Right Ventricle: The right ventricular size is normal. No increase in right ventricular wall thickness. Right ventricular systolic function is normal. There is normal pulmonary artery systolic pressure. The tricuspid regurgitant velocity is 1.62 m/s, and  with an assumed right atrial pressure of 3 mmHg, the estimated right ventricular systolic pressure is 06.2 mmHg. Left Atrium: Left atrial size was normal in size. Right Atrium: Right atrial size was normal in size. Pericardium: Large circumferential pericardial effusion. Increased in since since 08/2021. A large pericardial effusion is present. The pericardial effusion is circumferential. There is diastolic collapse of the right ventricular free wall. There is no  evidence of cardiac tamponade. Presence of epicardial fat layer. Mitral Valve: The mitral valve is normal in structure. No evidence of mitral valve regurgitation. No evidence of mitral valve stenosis. Tricuspid Valve: The tricuspid valve is normal in structure. Tricuspid valve regurgitation is trivial. No evidence of tricuspid stenosis. Aortic Valve: The aortic valve is tricuspid. Aortic valve regurgitation is not visualized. No aortic stenosis is present. Aortic valve mean gradient measures 4.0 mmHg. Aortic valve peak gradient measures 6.9 mmHg. Aortic valve area, by VTI measures 2.67 cm. Pulmonic Valve: The pulmonic valve was normal in structure. Pulmonic valve regurgitation is not visualized. No evidence of pulmonic stenosis. Aorta: The aortic root is normal in size and structure. Venous: The inferior vena cava is normal in size with greater than 50% respiratory variability, suggesting right atrial pressure of 3 mmHg. IAS/Shunts: No atrial level shunt detected by color flow Doppler.  LEFT VENTRICLE PLAX 2D LVIDd:         3.60 cm   Diastology LVIDs:         2.40 cm   LV e' medial:    6.76 cm/s LV  PW:         0.90 cm   LV E/e' medial:  10.7 LV IVS:        1.10 cm   LV e' lateral:   7.42 cm/s LVOT diam:     1.80 cm   LV E/e' lateral: 9.8 LV SV:         51 LV SV Index:   31 LVOT Area:     2.54 cm  RIGHT VENTRICLE          IVC RV Basal diam:  3.00 cm  IVC diam: 1.50 cm LEFT ATRIUM             Index        RIGHT ATRIUM           Index LA diam:        3.20 cm 1.94 cm/m   RA Area:     16.50 cm LA Vol (A2C):   41.5 ml 25.22 ml/m  RA Volume:   46.40 ml  28.19 ml/m LA Vol (A4C):   43.9 ml 26.67 ml/m LA Biplane Vol: 44.1 ml 26.80 ml/m  AORTIC VALVE AV Area (Vmax):    2.39 cm AV Area (Vmean):   2.31 cm AV Area (VTI):     2.67 cm AV Vmax:           131.00 cm/s AV Vmean:          90.100 cm/s AV VTI:            0.190 m AV Peak Grad:      6.9 mmHg AV Mean Grad:      4.0 mmHg LVOT Vmax:         123.00 cm/s LVOT Vmean:        81.700 cm/s LVOT VTI:          0.199 m LVOT/AV VTI ratio: 1.05  AORTA Ao Root diam: 2.80 cm Ao Asc diam:  2.90 cm MITRAL VALVE                TRICUSPID VALVE MV Area (PHT): 4.57 cm     TR Peak grad:   10.5 mmHg MV Decel Time: 166 msec     TR Vmax:        162.00 cm/s MV E velocity: 72.60 cm/s MV A velocity: 117.00 cm/s  SHUNTS MV E/A ratio:  0.62         Systemic VTI:  0.20 m                             Systemic Diam: 1.80 cm Skeet Latch MD Electronically signed by Skeet Latch MD Signature Date/Time: 11/05/2021/4:34:25 PM    Final      Assessment and Plan:   Large pericardial effusion -It was seen on her 08/2021 echo, but was small, no greatly increased in size - Her blood pressure has been up and down since admission, as low as 110/53, but as high as 177/73. - Do not have the equipment to get pulsus paradoxus -No RA collapse on echo - Although her effusion is large, she does not have tamponade by [facility] or by echo imaging -However, it is very concerning that the effusion has grown from small to large in 2 months, so perhaps the best option is to go ahead and put in a  drain, evaluate the fluid to determine the cause, and be able to focus treatment appropriately. - On the CT report,There is subtle pericardial enhancement, concerning for exudative effusion.  Echocardiogram personally reviewed.  Relatively sizable circumferential pericardial effusion with fibrinous material and pericardial thickening.  This is concerning for possible hemorrhagic effusion.  At this point, she does not appear to be in tamponade physiology, but with a enlarging effusion, she is at risk for develop developing tamponade.  Options will be waiting until Monday to perform pericardiocentesis however the question will be then what to be do with her anticoagulation.  If this is truly a bloody effusion, we would not want her to be on anticoagulation.  If we were to perform pericardiocentesis tonight, we will advance to the questions about the type of effusion, reduce the risk of tamponade physiology for now and allow time to make decisions about her overall levels of care.  Indeed if there is a large recurring effusion and concern for possible constrictive pericarditis in the future, this would potentially add to the information when it comes to decision making about potential palliative care.  2.  CHF exacerbation - BNP is 247, CHF seen on chest CT -She got Lasix 40 mg IV x2 doses yesterday and 1 dose today but it has been discontinued - With the large pericardial effusion, she is preload dependent so agree with stopping the Lasix for now -Also because of the pericardial effusion, volume status is difficult to assess but her PAS was normal on echo  3.  UTI, metabolic encephalopathy - She has been started on Rocephin, urine culture with multiple species - History of vascular dementia secondary to multiple strokes - MRI consistent with a few additional subacute ischemic strokes his last MRI 2 months ago. - Eliquis fell off her treatment plan when she was transferred from one SNF to another, it has  been resumed here - She is currently not hypoxemic, although O2 sats are low normal on room air, suspect they would drop further with any activity  4.  PAF: - She was in sinus rhythm on admission and is currently maintaining sinus rhythm - With recurrent CVAs on MRI, continue his anticoagulation is very important - Add heparin if Eliquis cannot be resumed right away   Risk Assessment/Risk Scores:       New York Heart Association (NYHA) Functional Class NYHA Class III  CHA2DS2-VASc Score = 8   This indicates a 10.8% annual risk of stroke.  The patient's score is based upon: CHF History: 1 HTN History: 1 Diabetes History: 1 Stroke History: 2 Vascular Disease History: 1 Age Score: 1 Gender Score: 1    For questions or updates, please contact [place] Please consult [email or web address] for contact info under    Signed, Rosaria Ferries, PA-C  11/06/2021 3:24 PM   ATTENDING ATTESTATION  I have seen, examined and evaluated the patient this PM along with Rosaria Ferries, PA.  After reviewing all the available data and chart, we discussed the patients laboratory, study & physical findings as well as symptoms in detail. I agree with her findings, examination as well as impression recommendations as per our discussion.    Attending adjustments noted in italics.   Tough situation.  I had a long talk with the patient's power of attorney (son) via FaceTime along with her daughters that were present in the room.  We discussed the findings of pericardial effusion that is notably worsened since October.  Not clear what the etiology is, however it is concerned that is possibly related to being on anticoagulation.  Recommendation would be to undergo pericardiocentesis in order to drain the effusion to reduce the risk of pericardial tamponade.  We could also determine if this is a hemorrhagic effusion versus nonhemorrhagic.  Indeed, if it is hemorrhagic, it would potentially change decision about  restarting anticoagulation at least for the short duration.  This is in a patient who has had multiple strokes while being off her DOAC.   I had a long discussion with the patient's family since she herself is not neurologically capable of making her own decisions.  She is talking about her grandkids as opposed to the procedure were discussing.  Therefore the son is decided to go ahead and proceed with the procedure.  Shared Decision Making/Informed Consent{ Pericardiocentesis procedure explained to patient and family in detail.  Risks include but not limited to right ventricular puncture, coronary artery perforation, liver injury, lung injury +/- pneumothorax.  Also recurrence of fluid and infection. After considering the risks benefits alternatives indications, the patient's son and 2 daughters agreed with the plan to proceed with the procedure today.  This would obviate the need for potentially doing it emergently over the weekend.  More recommendations to follow after pericardiocentesis completed.   Glenetta Hew, M.D., M.S. Interventional Cardiologist   Pager # [phone] Phone # [phone] [address] [place], [place] [address]

## 2021-11-07 DIAGNOSIS — I480 Paroxysmal atrial fibrillation: Secondary | ICD-10-CM | POA: Diagnosis not present

## 2021-11-07 DIAGNOSIS — I3139 Other pericardial effusion (noninflammatory): Secondary | ICD-10-CM | POA: Diagnosis not present

## 2021-11-07 DIAGNOSIS — I5033 Acute on chronic diastolic (congestive) heart failure: Secondary | ICD-10-CM | POA: Diagnosis not present

## 2021-11-07 LAB — GLUCOSE, CAPILLARY
Glucose-Capillary: 196 mg/dL — ABNORMAL HIGH (ref 70–99)
Glucose-Capillary: 184 mg/dL — ABNORMAL HIGH (ref 70–99)
Glucose-Capillary: 264 mg/dL — ABNORMAL HIGH (ref 70–99)
Glucose-Capillary: 150 mg/dL — ABNORMAL HIGH (ref 70–99)
Glucose-Capillary: 214 mg/dL — ABNORMAL HIGH (ref 70–99)

## 2021-11-07 MED ORDER — MAGNESIUM SULFATE 2 GM/50ML IV SOLN
2.0000 g | Freq: Once | INTRAVENOUS | Status: AC
  Administered 2021-11-07: 2 g via INTRAVENOUS
  Filled 2021-11-07: qty 50

## 2021-11-07 NOTE — Progress Notes (Signed)
Progress Note   Subjective   Doing well today, the patient denies CP or SOB.  No new concerns,  resting comfortably  Inpatient Medications    Scheduled Meds:  atorvastatin  80 mg Oral QHS   calcium-vitamin D  1 tablet Oral BID   Chlorhexidine Gluconate Cloth  6 each Topical Daily   colchicine  0.6 mg Oral Daily   diltiazem  120 mg Oral Daily   divalproex  1,000 mg Oral QPM   divalproex  250 mg Oral Daily   insulin aspart  0-5 Units Subcutaneous QHS   insulin aspart  0-9 Units Subcutaneous TID WC   levothyroxine  200 mcg Oral QAC breakfast   mirtazapine  15 mg Oral QHS   mupirocin ointment  1 application Nasal BID   pantoprazole  40 mg Oral QAC breakfast   PARoxetine  40 mg Oral Daily   pneumococcal 23 valent vaccine  0.5 mL Intramuscular Tomorrow-1000   QUEtiapine  100 mg Oral QHS   sodium chloride flush  3 mL Intravenous Q12H   cyanocobalamin  2,000 mcg Oral Daily   Continuous Infusions:  sodium chloride     cefTRIAXone (ROCEPHIN)  IV 1 g (11/06/21 2121)   PRN Meds: sodium chloride, acetaminophen **OR** acetaminophen, albuterol, senna-docusate, sodium chloride flush   Vital Signs    Vitals:   11/07/21 0400 11/07/21 0500 11/07/21 0610 11/07/21 0635  BP: 140/67 123/65 (!) 155/74   Pulse: 92 86 86   Resp: 12 (!) 21 18   Temp:    (!) 97.5 F (36.4 C)  TempSrc:    Oral  SpO2: 93% 93% 93%   Weight:  66.9 kg    Height:        Intake/Output Summary (Last 24 hours) at 11/07/2021 1749 Last data filed at 11/07/2021 0600 Gross per 24 hour  Intake 150 ml  Output 605 ml  Net -455 ml   Filed Weights   11/06/21 0355 11/06/21 1735 11/07/21 0500  Weight: 65.1 kg 64.6 kg 66.9 kg    Telemetry    sinus - Personally Reviewed  Physical Exam   GEN- The patient is elderly and frail appearing, alert  Head- normocephalic, atraumatic Eyes-  Sclera clear, conjunctiva pink Ears- hearing intact Oropharynx- clear Neck- supple, Lungs-  normal work of breathing Heart-  Regular rate and rhythm  GI- soft  Extremities- no clubbing, cyanosis, or edema  MS- diffuse atrophy Skin- no rash or lesion Psych- euthymic mood, full affect Neuro- strength and sensation are intact   Labs    Chemistry Recent Labs  Lab 11/04/21 1959 11/04/21 2006 11/05/21 0425 11/06/21 0319  NA 136 139 133* 136  K 4.6 4.5 4.6 4.1  CL 99  --  96* 97*  CO2 28  --  26 31  GLUCOSE 164*  --  210* 172*  BUN 63*  --  54* 44*  CREATININE 1.25*  --  1.05* 0.99  CALCIUM 10.1  --  9.1 10.3  PROT 6.2*  --  5.5*  --   ALBUMIN 2.4*  --  2.2*  --   AST 42*  --  44*  --   ALT 25  --  27  --   ALKPHOS 70  --  61  --   BILITOT 0.7  --  1.1  --   GFRNONAA 47*  --  58* >60  ANIONGAP 9  --  11 8     Hematology Recent Labs  Lab 11/04/21 1959 11/04/21 2006  11/05/21 0425 11/06/21 0319  WBC 7.5  --  7.6 7.1  RBC 4.00  --  3.36* 3.59*  HGB 12.6 11.6* 10.7* 11.3*  HCT 38.1 34.0* 32.3* 33.3*  MCV 95.3  --  96.1 92.8  MCH 31.5  --  31.8 31.5  MCHC 33.1  --  33.1 33.9  RDW 15.5  --  15.1 14.8  PLT 290  --  272 264     Patient ID   Claudia Keller is a 69 y.o. female with a hx of PAF on Eliquis, hypothyroid, COPD, DM2, HTN, IBS, HLD, OA, who is being seen 11/06/2021 for the evaluation of pericardial effusion, at the request of Dr Sloan Leiter. Assessment & Plan    1.  Large pericardial effusion Clinically stable with pericardial drain in place Still with some output from drain.  Will keep drain in place today and consider removal tomorrow. Awaiting results of pericardial fluid  2. Paroxysmal atrial fibrillation Remains in sinus Will keep [place] on hold for now Would not start IV heparin at this time  3. Acute on chronic diastolic dysfunction Likely worsened by effusion Continue diuresis as tolerated   Thompson Grayer MD, [facility] 11/07/2021 8:28 AM
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
## 2021-11-07 NOTE — Progress Notes (Signed)
PROGRESS NOTE    Claudia Keller  SWN:[id number] DOB: 05/20/1951 DOA: 11/04/2021 PCP: [facility] [place]    Brief Narrative:  70 year old with history of hypertension, COPD, type 2 diabetes not on treatment, chronic diastolic heart failure, hypothyroidism, recent stroke with left hemiparesis, history of atrial fibrillation and currently a long-term nursing home resident brought from facility with sodium 146 and hypoxemic 87% on room air.  Family reported not acting quite right since last few days.  Patient is confused.  Further history taken from patient's son. In the emergency room CT scan of the chest abdomen pelvis was done, no evidence of pulmonary embolism.  She was found to have large pericardial effusion on CT scan.  She is on room air.  Electrolytes are normal.  COVID-19 negative.  Urinalysis abnormal.  Admitted to the hospital with UTI, metabolic encephalopathy.   Assessment & Plan:   Principal Problem:   (HFpEF) heart failure with preserved ejection fraction (HCC) Active Problems:   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 (HCC)   Essential hypertension, benign   COPD (chronic obstructive pulmonary disease) (HCC)   Pericarditis   Hypoxemia   UTI (urinary tract infection)   CKD (chronic kidney disease) stage 3, GFR 30-59 ml/min (HCC)   Prolonged QT interval   AMS (altered mental status)   Pericardial effusion  Acute UTI present on admission infective metabolic encephalopathy in a patient with vascular dementia due to multiple stroke: Started on Rocephin.  Urine culture with multiple flora.  Will treat with 3 days of IV Rocephin and stop antibiotics. All-time fall precautions.  Delirium precautions. MRI of the brain consistent with few additional subacute ischemic stroke since last MRI 2 months ago.  Interruption in Eliquis therapy.  documented history of atrial fibrillation.  Holding Eliquis now given hemopericardium.    Hemopericardium  /pericardial effusion:  No tamponade physiology but patient had very thick pericardial effusion.   Pericardiotomy /drain placement by cardiology 12/30.   420 cc bloody fluid on pericardiocentesis, 50 cc bloody fluid overnight on the drain. Likely hemorrhagic, cytology pending.  Unlikely infected. Anticoagulation will be challenging in this patient who is continuing to have stroke in the context of underlying A. fib and now with bleeding into pericardium.  Hypothyroidism: On Synthroid.  Continue.  TSH is normal.  Type 2 diabetes: Diet controlled.  Continue monitoring and keep on sliding scale insulin.  Hypoxemia: Cause unknown.  Currently on room air.  Suspected diastolic heart failure exacerbation: Symptomatic treatment with IV Lasix.  Intake output monitoring.  Renal function monitoring.    Vascular dementia with behavioral disturbances: Fall precautions.  Delirium precautions.  On Depakote that is continued.   DVT prophylaxis:   Eliquis.  Hold.   Code Status: Full code.  Palliative care following. Family Communication: Son on the phone 12/30. Disposition Plan: Status is: Inpatient  Remains inpatient appropriate because: Significant altered mental status         Consultants:  Palliative care Cardiology  Procedures:  Pericardiocentesis 12/30  Antimicrobials:  Rocephin 12/28-   Subjective: Patient seen and examined.  Pleasantly confused.  Not in any distress.  No other overnight events.  Occasionally impulsive. Patient herself tells me that she continues to have many strokes.  Objective: Vitals:   11/07/21 1053 11/07/21 1100 11/07/21 1200 11/07/21 1300  BP: (!) 104/58 (!) 148/59 133/71 123/60  Pulse:  83 91 83  Resp:  (!) 24 17 15   Temp:  TempSrc:      SpO2:  95% 96% 94%  Weight:      Height:        Intake/Output Summary (Last 24 hours) at 11/07/2021 1326 Last data filed at 11/07/2021 1300 Gross per 24 hour  Intake 630 ml  Output 355 ml  Net 275 ml    Filed Weights   11/06/21 0355 11/06/21 1735 11/07/21 0500  Weight: 65.1 kg 64.6 kg 66.9 kg    Examination:  General exam: Appears comfortable.  Alert oriented x 1-2. Left hemiparesis with motor power 2/5. Respiratory system: Clear to auscultation. Respiratory effort normal. Cardiovascular system: S1 & S2 heard, RRR. No JVD, murmurs, rubs, gallops or clicks. No pedal edema. Pericardial drain on the xiphisternum, draining hemorrhagic fluid. Gastrointestinal system: Abdomen is nondistended, soft and nontender. No organomegaly or masses felt. Normal bowel sounds heard. Central nervous system: Alert and awake.  Anxious and pleasant, confused.  Moves all extremities but left side is weak.  Left leg is weaker than right.    Data Reviewed: I have personally reviewed following labs and imaging studies  CBC: Recent Labs  Lab 11/04/21 1959 11/04/21 2006 11/05/21 0425 11/06/21 0319  WBC 7.5  --  7.6 7.1  NEUTROABS 5.0  --   --  3.6  HGB 12.6 11.6* 10.7* 11.3*  HCT 38.1 34.0* 32.3* 33.3*  MCV 95.3  --  96.1 92.8  PLT 290  --  272 825   Basic Metabolic Panel: Recent Labs  Lab 11/04/21 1959 11/04/21 2006 11/05/21 0425 11/06/21 0319  NA 136 139 133* 136  K 4.6 4.5 4.6 4.1  CL 99  --  96* 97*  CO2 28  --  26 31  GLUCOSE 164*  --  210* 172*  BUN 63*  --  54* 44*  CREATININE 1.25*  --  1.05* 0.99  CALCIUM 10.1  --  9.1 10.3  MG  --   --   --  1.7  PHOS  --   --   --  2.6   GFR: Estimated Creatinine Clearance: 49.7 mL/min (by C-G formula based on SCr of 0.99 mg/dL). Liver Function Tests: Recent Labs  Lab 11/04/21 1959 11/05/21 0425  AST 42* 44*  ALT 25 27  ALKPHOS 70 61  BILITOT 0.7 1.1  PROT 6.2* 5.5*  ALBUMIN 2.4* 2.2*   No results for input(s): LIPASE, AMYLASE in the last 168 hours. No results for input(s): AMMONIA in the last 168 hours. Coagulation Profile: No results for input(s): INR, PROTIME in the last 168 hours. Cardiac Enzymes: No results for input(s):  CKTOTAL, CKMB, CKMBINDEX, TROPONINI in the last 168 hours. BNP (last 3 results) No results for input(s): PROBNP in the last 8760 hours. HbA1C: Recent Labs    11/05/21 0425  HGBA1C 7.8*   CBG: Recent Labs  Lab 11/06/21 1544 11/06/21 1752 11/06/21 2126 11/07/21 0635 11/07/21 1109  GLUCAP 196* 165* 234* 184* 264*   Lipid Profile: No results for input(s): CHOL, HDL, LDLCALC, TRIG, CHOLHDL, LDLDIRECT in the last 72 hours. Thyroid Function Tests: Recent Labs    11/05/21 0425  TSH 0.621   Anemia Panel: No results for input(s): VITAMINB12, FOLATE, FERRITIN, TIBC, IRON, RETICCTPCT in the last 72 hours. Sepsis Labs: No results for input(s): PROCALCITON, LATICACIDVEN in the last 168 hours.  Recent Results (from the past 240 hour(s))  Urine Culture     Status: Abnormal   Collection Time: 11/04/21  7:17 PM   Specimen: Urine, Catheterized  Result Value Ref  Range Status   Specimen Description URINE, CATHETERIZED  Final   Special Requests   Final    NONE Performed at [facility], [address] [place], [place] [address]    Culture MULTIPLE SPECIES PRESENT, SUGGEST RECOLLECTION (A)  Final   Report Status 11/05/2021 FINAL  Final  Resp Panel by RT-PCR (Flu A&B, Covid) Nasopharyngeal Swab     Status: None   Collection Time: 11/04/21  7:23 PM   Specimen: Nasopharyngeal Swab; Nasopharyngeal(NP) swabs in vial transport medium  Result Value Ref Range Status   SARS Coronavirus 2 by RT PCR NEGATIVE NEGATIVE Final    Comment: (NOTE) SARS-CoV-2 target nucleic acids are NOT DETECTED.  The SARS-CoV-2 RNA is generally detectable in upper respiratory specimens during the acute phase of infection. The lowest concentration of SARS-CoV-2 viral copies this assay can detect is 138 copies/mL. A negative result does not preclude SARS-Cov-2 infection and should not be used as the sole basis for treatment or other patient management decisions. A negative result may occur with  improper  specimen collection/handling, submission of specimen other than nasopharyngeal swab, presence of viral mutation(s) within the areas targeted by this assay, and inadequate number of viral copies(<138 copies/mL). A negative result must be combined with clinical observations, patient history, and epidemiological information. The expected result is Negative.  Fact Sheet for Patients:  EntrepreneurPulse.com.au  Fact Sheet for Healthcare Providers:  IncredibleEmployment.be  This test is no t yet approved or cleared by the [place] FDA and  has been authorized for detection and/or diagnosis of SARS-CoV-2 by FDA under an Emergency Use Authorization (EUA). This EUA will remain  in effect (meaning this test can be used) for the duration of the COVID-19 declaration under Section 564(b)(1) of the Act, 21 U.S.C.section 360bbb-3(b)(1), unless the authorization is terminated  or revoked sooner.       Influenza A by PCR NEGATIVE NEGATIVE Final   Influenza B by PCR NEGATIVE NEGATIVE Final    Comment: (NOTE) The Xpert Xpress SARS-CoV-2/FLU/RSV plus assay is intended as an aid in the diagnosis of influenza from Nasopharyngeal swab specimens and should not be used as a sole basis for treatment. Nasal washings and aspirates are unacceptable for Xpert Xpress SARS-CoV-2/FLU/RSV testing.  Fact Sheet for Patients: EntrepreneurPulse.com.au  Fact Sheet for Healthcare Providers: IncredibleEmployment.be  This test is not yet approved or cleared by the [place] FDA and has been authorized for detection and/or diagnosis of SARS-CoV-2 by FDA under an Emergency Use Authorization (EUA). This EUA will remain in effect (meaning this test can be used) for the duration of the COVID-19 declaration under Section 564(b)(1) of the Act, 21 U.S.C. section 360bbb-3(b)(1), unless the authorization is terminated or revoked.  Performed at  [facility], [place] [address], [place], [place] [address]   Body fluid culture w Gram Stain     Status: None (Preliminary result)   Collection Time: 11/06/21  5:00 PM   Specimen: PATH Cytology Misc. fluid; Body Fluid  Result Value Ref Range Status   Specimen Description FLUID  Final   Special Requests PERICARDIOCENTESIS  Final   Gram Stain   Final    NO SQUAMOUS EPITHELIAL CELLS SEEN FEW WBC SEEN NO ORGANISMS SEEN    Culture   Final    NO GROWTH < 24 HOURS Performed at [facility], [address] [place], [place] [address]    Report Status PENDING  Incomplete  MRSA Next Gen by PCR, Nasal  Status: None   Collection Time: 11/06/21  8:31 PM   Specimen: Nasal Mucosa; Nasal Swab  Result Value Ref Range Status   MRSA by PCR Next Gen NOT DETECTED NOT DETECTED Final    Comment: (NOTE) The GeneXpert MRSA Assay (FDA approved for NASAL specimens only), is one component of a comprehensive MRSA colonization surveillance program. It is not intended to diagnose MRSA infection nor to guide or monitor treatment for MRSA infections. Test performance is not FDA approved in patients less than 36 years old. Performed at [facility], [place] [address], [place], [place] [address]          Radiology Studies: CARDIAC CATHETERIZATION  Result Date: 11/06/2021   Successful pericardiocentesis removing 420 cc of bloody fluid. Await cytology results and other laboratory analysis of the pericardial fluid. Consider removing pericardial drain tomorrow depending on the output. Results conveyed to son, Charlotte Crumb.   ECHOCARDIOGRAM LIMITED  Result Date: 11/06/2021    ECHOCARDIOGRAM LIMITED REPORT   Patient Name:   SAMARIA ANES Date of Exam: 11/06/2021 Medical Rec #:  [id number]      Height:       63.5 in Accession #:    [id number]     Weight:       143.5 lb Date of Birth:  [birth year]      BSA:          1.689 m Patient Age:    42 years       BP:           159/68 mmHg Patient Gender: F               HR:           88 bpm. Exam Location:  Inpatient Procedure: Limited Echo Indications:    pericardial tap  History:        Patient has prior history of Echocardiogram examinations, most                 recent 11/05/2021. Pericardial Disease, chronic kidney disease                 and COPD, Signs/Symptoms:Altered Mental Status; Risk                 Factors:Diabetes and Hypertension.  Sonographer:    Johny Chess RDCS Referring Phys: [place]  1. Large circumferential pericardial effusion up to 2.0 cm posterior to the LV. No RA/RV diastolic collapse. No dopper interrogation. Echo guided pericardiocentesis performed. Last image shows catheter posterior to LV, with improvement of effusion on final image to 0.5 cm. Large pericardial effusion. The pericardial effusion is circumferential.  2. Left ventricular ejection fraction, by estimation, is 60 to 65%. The left ventricle has normal function. The left ventricle has no regional wall motion abnormalities.  3. Right ventricular systolic function is normal. The right ventricular size is normal. FINDINGS  Left Ventricle: Left ventricular ejection fraction, by estimation, is 60 to 65%. The left ventricle has normal function. The left ventricle has no regional wall motion abnormalities. Right Ventricle: The right ventricular size is normal. No increase in right ventricular wall thickness. Right ventricular systolic function is normal. Left Atrium: Left atrial size was normal in size. Right Atrium: Right atrial size was normal in size. Pericardium: Large circumferential pericardial effusion up to 2.0 cm posterior to the LV. No RA/RV diastolic collapse. No dopper interrogation. Echo guided pericardiocentesis performed. Last image shows catheter posterior to LV, with improvement of  effusion on final image to 0.5 cm. A large pericardial effusion is present. The pericardial effusion is circumferential. Presence of epicardial fat layer. Eleonore Chiquito MD Electronically signed by Eleonore Chiquito MD Signature Date/Time: 11/06/2021/5:52:14 PM    Final         Scheduled Meds:  atorvastatin  80 mg Oral QHS   calcium-vitamin D  1 tablet Oral BID   Chlorhexidine Gluconate Cloth  6 each Topical Daily   colchicine  0.6 mg Oral Daily   diltiazem  120 mg Oral Daily   divalproex  1,000 mg Oral QPM   divalproex  250 mg Oral Daily   insulin aspart  0-5 Units Subcutaneous QHS   insulin aspart  0-9 Units Subcutaneous TID WC   levothyroxine  200 mcg Oral QAC breakfast   mirtazapine  15 mg Oral QHS   mupirocin ointment  1 application Nasal BID   pantoprazole  40 mg Oral QAC breakfast   PARoxetine  40 mg Oral Daily   pneumococcal 23 valent vaccine  0.5 mL Intramuscular Tomorrow-1000   QUEtiapine  100 mg Oral QHS   sodium chloride flush  3 mL Intravenous Q12H   cyanocobalamin  2,000 mcg Oral Daily   Continuous Infusions:  sodium chloride     cefTRIAXone (ROCEPHIN)  IV 1 g (11/06/21 2121)     LOS: 2 days    Time spent: 30 minutes    Barb Merino, MD [facility] Pager [phone]

## 2021-11-08 DIAGNOSIS — I69354 Hemiplegia and hemiparesis following cerebral infarction affecting left non-dominant side: Secondary | ICD-10-CM | POA: Diagnosis not present

## 2021-11-08 DIAGNOSIS — I10 Essential (primary) hypertension: Secondary | ICD-10-CM | POA: Diagnosis not present

## 2021-11-08 DIAGNOSIS — F0150 Vascular dementia without behavioral disturbance: Secondary | ICD-10-CM | POA: Diagnosis present

## 2021-11-08 DIAGNOSIS — I5033 Acute on chronic diastolic (congestive) heart failure: Secondary | ICD-10-CM | POA: Diagnosis present

## 2021-11-08 DIAGNOSIS — J9601 Acute respiratory failure with hypoxia: Secondary | ICD-10-CM | POA: Diagnosis present

## 2021-11-08 DIAGNOSIS — J449 Chronic obstructive pulmonary disease, unspecified: Secondary | ICD-10-CM | POA: Diagnosis present

## 2021-11-08 DIAGNOSIS — Z66 Do not resuscitate: Secondary | ICD-10-CM | POA: Diagnosis present

## 2021-11-08 DIAGNOSIS — I312 Hemopericardium, not elsewhere classified: Secondary | ICD-10-CM | POA: Diagnosis present

## 2021-11-08 DIAGNOSIS — I130 Hypertensive heart and chronic kidney disease with heart failure and stage 1 through stage 4 chronic kidney disease, or unspecified chronic kidney disease: Secondary | ICD-10-CM | POA: Diagnosis present

## 2021-11-08 DIAGNOSIS — I5030 Unspecified diastolic (congestive) heart failure: Secondary | ICD-10-CM | POA: Diagnosis not present

## 2021-11-08 DIAGNOSIS — I3139 Other pericardial effusion (noninflammatory): Secondary | ICD-10-CM | POA: Diagnosis not present

## 2021-11-08 DIAGNOSIS — F32A Depression, unspecified: Secondary | ICD-10-CM | POA: Diagnosis present

## 2021-11-08 DIAGNOSIS — E1122 Type 2 diabetes mellitus with diabetic chronic kidney disease: Secondary | ICD-10-CM | POA: Diagnosis present

## 2021-11-08 DIAGNOSIS — I480 Paroxysmal atrial fibrillation: Secondary | ICD-10-CM | POA: Diagnosis not present

## 2021-11-08 DIAGNOSIS — Z8616 Personal history of COVID-19: Secondary | ICD-10-CM | POA: Diagnosis not present

## 2021-11-08 DIAGNOSIS — R0902 Hypoxemia: Secondary | ICD-10-CM | POA: Diagnosis present

## 2021-11-08 DIAGNOSIS — N3000 Acute cystitis without hematuria: Secondary | ICD-10-CM | POA: Diagnosis not present

## 2021-11-08 DIAGNOSIS — D6832 Hemorrhagic disorder due to extrinsic circulating anticoagulants: Secondary | ICD-10-CM | POA: Diagnosis present

## 2021-11-08 DIAGNOSIS — G9341 Metabolic encephalopathy: Secondary | ICD-10-CM | POA: Diagnosis present

## 2021-11-08 DIAGNOSIS — I959 Hypotension, unspecified: Secondary | ICD-10-CM | POA: Diagnosis present

## 2021-11-08 DIAGNOSIS — N182 Chronic kidney disease, stage 2 (mild): Secondary | ICD-10-CM | POA: Diagnosis present

## 2021-11-08 DIAGNOSIS — N390 Urinary tract infection, site not specified: Secondary | ICD-10-CM | POA: Diagnosis present

## 2021-11-08 DIAGNOSIS — E039 Hypothyroidism, unspecified: Secondary | ICD-10-CM | POA: Diagnosis present

## 2021-11-08 DIAGNOSIS — R410 Disorientation, unspecified: Secondary | ICD-10-CM | POA: Diagnosis not present

## 2021-11-08 DIAGNOSIS — N179 Acute kidney failure, unspecified: Secondary | ICD-10-CM | POA: Diagnosis present

## 2021-11-08 LAB — GLUCOSE, CAPILLARY
Glucose-Capillary: 163 mg/dL — ABNORMAL HIGH (ref 70–99)
Glucose-Capillary: 302 mg/dL — ABNORMAL HIGH (ref 70–99)
Glucose-Capillary: 284 mg/dL — ABNORMAL HIGH (ref 70–99)
Glucose-Capillary: 155 mg/dL — ABNORMAL HIGH (ref 70–99)
Glucose-Capillary: 232 mg/dL — ABNORMAL HIGH (ref 70–99)

## 2021-11-08 LAB — RESP PANEL BY RT-PCR (FLU A&B, COVID) ARPGX2
Influenza A by PCR: NEGATIVE
Influenza B by PCR: NEGATIVE
SARS Coronavirus 2 by RT PCR: NEGATIVE

## 2021-11-08 MED ORDER — SENNOSIDES-DOCUSATE SODIUM 8.6-50 MG PO TABS
2.0000 | ORAL_TABLET | Freq: Two times a day (BID) | ORAL | Status: DC
  Administered 2021-11-08 – 2021-11-10 (×4): 2 via ORAL
  Filled 2021-11-08 (×5): qty 2

## 2021-11-08 NOTE — Progress Notes (Signed)
Progress Note   Subjective   Doing well today, the patient denies CP or SOB.  No new concerns  Inpatient Medications    Scheduled Meds:  atorvastatin  80 mg Oral QHS   calcium-vitamin D  1 tablet Oral BID   Chlorhexidine Gluconate Cloth  6 each Topical Daily   colchicine  0.6 mg Oral Daily   diltiazem  120 mg Oral Daily   divalproex  1,000 mg Oral QPM   divalproex  250 mg Oral Daily   insulin aspart  0-5 Units Subcutaneous QHS   insulin aspart  0-9 Units Subcutaneous TID WC   levothyroxine  200 mcg Oral QAC breakfast   mirtazapine  15 mg Oral QHS   mupirocin ointment  1 application Nasal BID   pantoprazole  40 mg Oral QAC breakfast   PARoxetine  40 mg Oral Daily   pneumococcal 23 valent vaccine  0.5 mL Intramuscular Tomorrow-1000   QUEtiapine  100 mg Oral QHS   sodium chloride flush  3 mL Intravenous Q12H   cyanocobalamin  2,000 mcg Oral Daily   Continuous Infusions:  sodium chloride 500 mL (11/07/21 2023)   cefTRIAXone (ROCEPHIN)  IV 1 g (11/07/21 2358)   PRN Meds: sodium chloride, acetaminophen **OR** acetaminophen, albuterol, senna-docusate, sodium chloride flush   Vital Signs    Vitals:   11/08/21 0500 11/08/21 0600 11/08/21 0700 11/08/21 0800  BP: (!) 94/53 (!) 142/64 (!) 147/73 130/68  Pulse: 74 80 82 82  Resp: 12 15 16 17   Temp:   98.5 F (36.9 C)   TempSrc:   Oral   SpO2: 100% 100% 100% 99%  Weight: 66 kg     Height:        Intake/Output Summary (Last 24 hours) at 11/08/2021 0856 Last data filed at 11/08/2021 0800 Gross per 24 hour  Intake 820.17 ml  Output 677 ml  Net 143.17 ml   Filed Weights   11/06/21 1735 11/07/21 0500 11/08/21 0500  Weight: 64.6 kg 66.9 kg 66 kg    Telemetry    sinus - Personally Reviewed  Physical Exam   GEN- The patient is elderly and frail appearing, alert  Head- normocephalic, atraumatic Eyes-  Sclera clear, conjunctiva pink Ears- hearing intact Oropharynx- clear Neck- supple, Lungs-  normal work of  breathing Heart- Regular rate and rhythm  GI- soft  Extremities- no clubbing, cyanosis, + edema  MS- diffuse atrophy Skin- pericardial drain site looks good   Labs    Chemistry Recent Labs  Lab 11/04/21 1959 11/04/21 2006 11/05/21 0425 11/06/21 0319  NA 136 139 133* 136  K 4.6 4.5 4.6 4.1  CL 99  --  96* 97*  CO2 28  --  26 31  GLUCOSE 164*  --  210* 172*  BUN 63*  --  54* 44*  CREATININE 1.25*  --  1.05* 0.99  CALCIUM 10.1  --  9.1 10.3  PROT 6.2*  --  5.5*  --   ALBUMIN 2.4*  --  2.2*  --   AST 42*  --  44*  --   ALT 25  --  27  --   ALKPHOS 70  --  61  --   BILITOT 0.7  --  1.1  --   GFRNONAA 47*  --  58* >60  ANIONGAP 9  --  11 8     Hematology Recent Labs  Lab 11/04/21 1959 11/04/21 2006 11/05/21 0425 11/06/21 0319  WBC 7.5  --  7.6  7.1  RBC 4.00  --  3.36* 3.59*  HGB 12.6 11.6* 10.7* 11.3*  HCT 38.1 34.0* 32.3* 33.3*  MCV 95.3  --  96.1 92.8  MCH 31.5  --  31.8 31.5  MCHC 33.1  --  33.1 33.9  RDW 15.5  --  15.1 14.8  PLT 290  --  272 264     Patient ID  Claudia Keller is a 71 y.o. female with a hx of PAF on Eliquis, hypothyroid, COPD, DM2, HTN, IBS, HLD, OA, who is being seen 11/06/2021 for the evaluation of pericardial effusion, at the request of Dr Sloan Leiter.  Assessment & Plan    1.  Large pericardial effusion Only 2cc output from drain overnight. I personally flushed/ aspirated the drain to confirm no residual fluid could be removed and then removed the drain in its entirety uneventfully. Limited echo tomorrow is ordered Awaiting results of pericardial fluid  2. Paroxysmal atrial fibrillation In sinus currently OAC currently on hold Would not bridge with heparin as she is currently in sinus.  Could consider DVT prophylaxis dosing of heparin for now.  Would resume [place] in 48 hours if no reaccumulation of fluid on echo tomorrow  3. Acute on chronic diastolic dysfunction Continue diuresis as tolerated  Transfer to telemetry today  Thompson Grayer MD, [facility] 11/08/2021 8:56 AM

## 2021-11-08 NOTE — Progress Notes (Signed)
Hand off report was done by Maricela Bo. RN. Patient transported in wheelchair to room 319 by this nurse and NT. Angel RN at bedside to receive patient and assist with wheelchair to bed transfer. Patient in bed safely and primary nurse Glenard Haring will resume care at this time. Thank you.

## 2021-11-08 NOTE — Progress Notes (Addendum)
PROGRESS NOTE    Claudia Keller  YJE:[id number] DOB: February 12, 1951 DOA: 11/04/2021 PCP: [facility]    Brief Narrative:  71 year old with history of hypertension, COPD, type 2 diabetes not on treatment, chronic diastolic heart failure, hypothyroidism, recent stroke with left hemiparesis, history of atrial fibrillation and currently a long-term nursing home resident brought from facility with sodium 146 and hypoxemic 87% on room air.  Family reported not acting quite right since last few days.  Patient is confused.  Further history taken from patient's son. In the emergency room CT scan of the chest abdomen pelvis was done, no evidence of pulmonary embolism.  She was found to have large pericardial effusion on CT scan.  She is on room air.  Electrolytes are normal.  COVID-19 negative.  Urinalysis abnormal.  Admitted to the hospital with UTI, metabolic encephalopathy. Patient was treated with Rocephin for UTI.  Which has since completed.  Echocardiogram showed large pericardial effusion, pericardiocentesis was performed on 12/30, removed 450 mL of bloody fluids.  Pericardial drain was kept, and removed on 11/08/2021.   Assessment & Plan:   Principal Problem:   (HFpEF) heart failure with preserved ejection fraction (HCC) Active Problems:   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 (HCC)   Essential hypertension, benign   COPD (chronic obstructive pulmonary disease) (HCC)   Pericarditis   Hypoxemia   UTI (urinary tract infection)   CKD (chronic kidney disease) stage 3, GFR 30-59 ml/min (HCC)   Prolonged QT interval   AMS (altered mental status)   Pericardial effusion  Metabolic encephalopathy secondary to infection.  UTI. Urine culture has multiple bacteria growth, Patient is treated with Rocephin for 3 days, last dose will be tonight. Mental status had improved.  Hemopericardium/pericardial effusion. Status post pericardiotomy/doing placement. 420 mL  of bloody fluid was initially removed.  Today on he have 5 mL removed.  Cardiology has removed the drain.  Ordered a limited echocardiogram tomorrow.  Paroxysmal atrial fibrillation. Subacute strokes. Vascular dementia secondary to multiple strokes. Strokes probably due to atrial fibrillation with emboli. However, due to pericardial bleeding, not able to start anticoagulation.  Patient is a followed by cardiology  Acute on chronic diastolic congestive heart failure. Ruled in. Acute hypoxemic respite failure secondary to congestive heart failure with COPD. POA. Patient short of breath improved after giving IV Lasix.  This is consistent with acute on chronic diastolic congestive heart failure. Patient is still requiring 4 L oxygen this morning, will continue to wean that down. Patient may need home oxygen due to COPD.  Acute kidney injury. Renal function has normalized.  COPD  Will obtain home oxygen evaluation before discharge  Type 2 diabetes Continue current regimen.  Son states that the patient had a contact with people who was COVID-positive before admission.  Initial COVID test was negative, will retest.   DVT prophylaxis: SCDs Code Status: full Family Communication:  Disposition Plan:    Status is: Inpatient  Remains inpatient appropriate because: Severity of disease.        I/O last 3 completed shifts: In: 850.2 [P.O.:750; IV Piggyback:100.2] Out: 44 [Urine:970; Drains:60] Total I/O In: 240 [P.O.:240] Out: 2 [Drains:2]     Consultants:  cardiology  Procedures: Pericardiocentesis.  Antimicrobials: None  Subjective: Patient has some baseline dementia and confusion.  Otherwise she is doing better. She is still on the oxygen, which she has weaned down to 2 this morning. She denies any short of breath or cough. She  has no abdominal pain nausea vomiting, last bowel movement was 3 days ago. No fever chills  No dysuria hematuria.   Objective: Vitals:    11/08/21 0500 11/08/21 0600 11/08/21 0700 11/08/21 0800  BP: (!) 94/53 (!) 142/64 (!) 147/73 130/68  Pulse: 74 80 82 82  Resp: 12 15 16 17   Temp:   98.5 F (36.9 C)   TempSrc:   Oral   SpO2: 100% 100% 100% 99%  Weight: 66 kg     Height:        Intake/Output Summary (Last 24 hours) at 11/08/2021 1035 Last data filed at 11/08/2021 0800 Gross per 24 hour  Intake 820.17 ml  Output 677 ml  Net 143.17 ml   Filed Weights   11/06/21 1735 11/07/21 0500 11/08/21 0500  Weight: 64.6 kg 66.9 kg 66 kg    Examination:  General exam: Appears calm and comfortable  Respiratory system: Clear to auscultation. Respiratory effort normal. Cardiovascular system: S1 & S2 heard, RRR. No JVD, murmurs, rubs, gallops or clicks. No pedal edema. Gastrointestinal system: Abdomen is nondistended, soft and nontender. No organomegaly or masses felt. Normal bowel sounds heard. Central nervous system: Alert and oriented x2. No focal neurological deficits. Extremities: Symmetric 5 x 5 power. Skin: No rashes, lesions or ulcers Psychiatry: Mood & affect appropriate.     Data Reviewed: I have personally reviewed following labs and imaging studies  CBC: Recent Labs  Lab 11/04/21 1959 11/04/21 2006 11/05/21 0425 11/06/21 0319  WBC 7.5  --  7.6 7.1  NEUTROABS 5.0  --   --  3.6  HGB 12.6 11.6* 10.7* 11.3*  HCT 38.1 34.0* 32.3* 33.3*  MCV 95.3  --  96.1 92.8  PLT 290  --  272 656   Basic Metabolic Panel: Recent Labs  Lab 11/04/21 1959 11/04/21 2006 11/05/21 0425 11/06/21 0319  NA 136 139 133* 136  K 4.6 4.5 4.6 4.1  CL 99  --  96* 97*  CO2 28  --  26 31  GLUCOSE 164*  --  210* 172*  BUN 63*  --  54* 44*  CREATININE 1.25*  --  1.05* 0.99  CALCIUM 10.1  --  9.1 10.3  MG  --   --   --  1.7  PHOS  --   --   --  2.6   GFR: Estimated Creatinine Clearance: 49.4 mL/min (by C-G formula based on SCr of 0.99 mg/dL). Liver Function Tests: Recent Labs  Lab 11/04/21 1959 11/05/21 0425  AST 42* 44*   ALT 25 27  ALKPHOS 70 61  BILITOT 0.7 1.1  PROT 6.2* 5.5*  ALBUMIN 2.4* 2.2*   No results for input(s): LIPASE, AMYLASE in the last 168 hours. No results for input(s): AMMONIA in the last 168 hours. Coagulation Profile: No results for input(s): INR, PROTIME in the last 168 hours. Cardiac Enzymes: No results for input(s): CKTOTAL, CKMB, CKMBINDEX, TROPONINI in the last 168 hours. BNP (last 3 results) No results for input(s): PROBNP in the last 8760 hours. HbA1C: No results for input(s): HGBA1C in the last 72 hours. CBG: Recent Labs  Lab 11/07/21 0635 11/07/21 1109 11/07/21 1549 11/07/21 2147 11/08/21 0656  GLUCAP 184* 264* 150* 214* 163*   Lipid Profile: No results for input(s): CHOL, HDL, LDLCALC, TRIG, CHOLHDL, LDLDIRECT in the last 72 hours. Thyroid Function Tests: No results for input(s): TSH, T4TOTAL, FREET4, T3FREE, THYROIDAB in the last 72 hours. Anemia Panel: No results for input(s): VITAMINB12, FOLATE, FERRITIN, TIBC, IRON, RETICCTPCT  in the last 72 hours. Sepsis Labs: No results for input(s): PROCALCITON, LATICACIDVEN in the last 168 hours.  Recent Results (from the past 240 hour(s))  Urine Culture     Status: Abnormal   Collection Time: 11/04/21  7:17 PM   Specimen: Urine, Catheterized  Result Value Ref Range Status   Specimen Description URINE, CATHETERIZED  Final   Special Requests   Final    NONE Performed at [facility], [address], [place], [place] [address]    Culture MULTIPLE SPECIES PRESENT, SUGGEST RECOLLECTION (A)  Final   Report Status 11/05/2021 FINAL  Final  Resp Panel by RT-PCR (Flu A&B, Covid) Nasopharyngeal Swab     Status: None   Collection Time: 11/04/21  7:23 PM   Specimen: Nasopharyngeal Swab; Nasopharyngeal(NP) swabs in vial transport medium  Result Value Ref Range Status   SARS Coronavirus 2 by RT PCR NEGATIVE NEGATIVE Final    Comment: (NOTE) SARS-CoV-2 target nucleic acids are NOT DETECTED.  The SARS-CoV-2 RNA is  generally detectable in upper respiratory specimens during the acute phase of infection. The lowest concentration of SARS-CoV-2 viral copies this assay can detect is 138 copies/mL. A negative result does not preclude SARS-Cov-2 infection and should not be used as the sole basis for treatment or other patient management decisions. A negative result may occur with  improper specimen collection/handling, submission of specimen other than nasopharyngeal swab, presence of viral mutation(s) within the areas targeted by this assay, and inadequate number of viral copies(<138 copies/mL). A negative result must be combined with clinical observations, patient history, and epidemiological information. The expected result is Negative.  Fact Sheet for Patients:  EntrepreneurPulse.com.au  Fact Sheet for Healthcare Providers:  IncredibleEmployment.be  This test is no t yet approved or cleared by the [place] FDA and  has been authorized for detection and/or diagnosis of SARS-CoV-2 by FDA under an Emergency Use Authorization (EUA). This EUA will remain  in effect (meaning this test can be used) for the duration of the COVID-19 declaration under Section 564(b)(1) of the Act, 21 U.S.C.section 360bbb-3(b)(1), unless the authorization is terminated  or revoked sooner.       Influenza A by PCR NEGATIVE NEGATIVE Final   Influenza B by PCR NEGATIVE NEGATIVE Final    Comment: (NOTE) The Xpert Xpress SARS-CoV-2/FLU/RSV plus assay is intended as an aid in the diagnosis of influenza from Nasopharyngeal swab specimens and should not be used as a sole basis for treatment. Nasal washings and aspirates are unacceptable for Xpert Xpress SARS-CoV-2/FLU/RSV testing.  Fact Sheet for Patients: EntrepreneurPulse.com.au  Fact Sheet for Healthcare Providers: IncredibleEmployment.be  This test is not yet approved or cleared by the [place] FDA and has been authorized for detection and/or diagnosis of SARS-CoV-2 by FDA under an Emergency Use Authorization (EUA). This EUA will remain in effect (meaning this test can be used) for the duration of the COVID-19 declaration under Section 564(b)(1) of the Act, 21 U.S.C. section 360bbb-3(b)(1), unless the authorization is terminated or revoked.  Performed at [facility], [place] [address], [place], [place] [address]   Body fluid culture w Gram Stain     Status: None (Preliminary result)   Collection Time: 11/06/21  5:00 PM   Specimen: PATH Cytology Misc. fluid; Body Fluid  Result Value Ref Range Status   Specimen Description FLUID  Final   Special Requests PERICARDIOCENTESIS  Final   Gram Stain   Final    NO SQUAMOUS EPITHELIAL CELLS SEEN  FEW WBC SEEN NO ORGANISMS SEEN    Culture   Final    NO GROWTH 2 DAYS Performed at [facility], [place] [address], [place], [place] [address]    Report Status PENDING  Incomplete  MRSA Next Gen by PCR, Nasal     Status: None   Collection Time: 11/06/21  8:31 PM   Specimen: Nasal Mucosa; Nasal Swab  Result Value Ref Range Status   MRSA by PCR Next Gen NOT DETECTED NOT DETECTED Final    Comment: (NOTE) The GeneXpert MRSA Assay (FDA approved for NASAL specimens only), is one component of a comprehensive MRSA colonization surveillance program. It is not intended to diagnose MRSA infection nor to guide or monitor treatment for MRSA infections. Test performance is not FDA approved in patients less than 56 years old. Performed at [facility], [place] [address] [place], [place] [address]          Radiology Studies: CARDIAC CATHETERIZATION  Result Date: 11/06/2021   Successful pericardiocentesis removing 420 cc of bloody fluid. Await cytology results and other laboratory analysis of the pericardial fluid. Consider removing pericardial drain tomorrow depending on the output. Results conveyed to son, Charlotte Crumb.    ECHOCARDIOGRAM LIMITED  Result Date: 11/06/2021    ECHOCARDIOGRAM LIMITED REPORT   Patient Name:   Claudia Keller Date of Exam: 11/06/2021 Medical Rec #:  [id number]      Height:       63.5 in Accession #:    [id number]     Weight:       143.5 lb Date of Birth:  02/26/1951      BSA:          1.689 m Patient Age:    59 years       BP:           159/68 mmHg Patient Gender: F              HR:           88 bpm. Exam Location:  Inpatient Procedure: Limited Echo Indications:    pericardial tap  History:        Patient has prior history of Echocardiogram examinations, most                 recent 11/05/2021. Pericardial Disease, chronic kidney disease                 and COPD, Signs/Symptoms:Altered Mental Status; Risk                 Factors:Diabetes and Hypertension.  Sonographer:    Johny Chess RDCS Referring Phys: [place]  1. Large circumferential pericardial effusion up to 2.0 cm posterior to the LV. No RA/RV diastolic collapse. No dopper interrogation. Echo guided pericardiocentesis performed. Last image shows catheter posterior to LV, with improvement of effusion on final image to 0.5 cm. Large pericardial effusion. The pericardial effusion is circumferential.  2. Left ventricular ejection fraction, by estimation, is 60 to 65%. The left ventricle has normal function. The left ventricle has no regional wall motion abnormalities.  3. Right ventricular systolic function is normal. The right ventricular size is normal. FINDINGS  Left Ventricle: Left ventricular ejection fraction, by estimation, is 60 to 65%. The left ventricle has normal function. The left ventricle has no regional wall motion abnormalities. Right Ventricle: The right ventricular size is normal. No increase in right ventricular wall thickness. Right ventricular systolic function is normal.  Left Atrium: Left atrial size was normal in size. Right Atrium: Right atrial size was normal in size. Pericardium: Large  circumferential pericardial effusion up to 2.0 cm posterior to the LV. No RA/RV diastolic collapse. No dopper interrogation. Echo guided pericardiocentesis performed. Last image shows catheter posterior to LV, with improvement of effusion on final image to 0.5 cm. A large pericardial effusion is present. The pericardial effusion is circumferential. Presence of epicardial fat layer. Eleonore Chiquito MD Electronically signed by Eleonore Chiquito MD Signature Date/Time: 11/06/2021/5:52:14 PM    Final         Scheduled Meds:  atorvastatin  80 mg Oral QHS   calcium-vitamin D  1 tablet Oral BID   Chlorhexidine Gluconate Cloth  6 each Topical Daily   colchicine  0.6 mg Oral Daily   diltiazem  120 mg Oral Daily   divalproex  1,000 mg Oral QPM   divalproex  250 mg Oral Daily   insulin aspart  0-5 Units Subcutaneous QHS   insulin aspart  0-9 Units Subcutaneous TID WC   levothyroxine  200 mcg Oral QAC breakfast   mirtazapine  15 mg Oral QHS   mupirocin ointment  1 application Nasal BID   pantoprazole  40 mg Oral QAC breakfast   PARoxetine  40 mg Oral Daily   pneumococcal 23 valent vaccine  0.5 mL Intramuscular Tomorrow-1000   QUEtiapine  100 mg Oral QHS   sodium chloride flush  3 mL Intravenous Q12H   cyanocobalamin  2,000 mcg Oral Daily   Continuous Infusions:  sodium chloride 500 mL (11/07/21 2023)   cefTRIAXone (ROCEPHIN)  IV 1 g (11/07/21 2358)     LOS: 3 days    Time spent: 32 minutes    Sharen Hones, MD [facility]   To contact the attending provider between 7A-7P or the covering provider during after hours 7P-7A, please log into the web site [email or web address] and access using universal [place] password for that web site. If you do not have the password, please call the hospital operator.  11/08/2021, 10:35 AM

## 2021-11-09 ENCOUNTER — Inpatient Hospital Stay (HOSPITAL_COMMUNITY): Payer: Medicare (Managed Care)

## 2021-11-09 DIAGNOSIS — I5030 Unspecified diastolic (congestive) heart failure: Secondary | ICD-10-CM | POA: Diagnosis not present

## 2021-11-09 DIAGNOSIS — I10 Essential (primary) hypertension: Secondary | ICD-10-CM | POA: Diagnosis not present

## 2021-11-09 DIAGNOSIS — I3139 Other pericardial effusion (noninflammatory): Secondary | ICD-10-CM

## 2021-11-09 DIAGNOSIS — G9341 Metabolic encephalopathy: Secondary | ICD-10-CM

## 2021-11-09 DIAGNOSIS — N3000 Acute cystitis without hematuria: Secondary | ICD-10-CM

## 2021-11-09 DIAGNOSIS — I480 Paroxysmal atrial fibrillation: Secondary | ICD-10-CM | POA: Diagnosis present

## 2021-11-09 HISTORY — DX: Metabolic encephalopathy: G93.41

## 2021-11-09 LAB — LD, BODY FLUID (OTHER): LD, Body Fluid: 711 IU/L

## 2021-11-09 LAB — GLUCOSE, CAPILLARY
Glucose-Capillary: 174 mg/dL — ABNORMAL HIGH (ref 70–99)
Glucose-Capillary: 209 mg/dL — ABNORMAL HIGH (ref 70–99)
Glucose-Capillary: 191 mg/dL — ABNORMAL HIGH (ref 70–99)
Glucose-Capillary: 200 mg/dL — ABNORMAL HIGH (ref 70–99)

## 2021-11-09 LAB — ECHOCARDIOGRAM LIMITED
Height: 63.386 in
Weight: 2328.06 oz

## 2021-11-09 LAB — GLUCOSE, BODY FLUID OTHER: Glucose, Body Fluid Other: 121 mg/dL

## 2021-11-09 LAB — PROTEIN, BODY FLUID (OTHER): Total Protein, Body Fluid Other: 4.1 g/dL

## 2021-11-09 NOTE — Progress Notes (Addendum)
Brief Palliative Medicine Progress Note:  Received notification that patient's son called PMT phone with questions about her "changes from baseline."  Chart review performed.  1:54 PM Called son/Johnny - answered questions around patient's previous and current functional ability based on chart review. Reviewed anticipated course at rehab. Also discussed possible need for LTC placement after rehab - Charlotte Crumb confirms long term goal is for patient to transition from rehab side of [facility] to LTC side. Patient's daughter/Johnny's sister is in town and trying to decide if she is going to stay longer. All questions and concerns addressed. Encouraged to call with questions and/or concerns. PMT number previously provided.  Recommend outpatient Palliative Care to follow.  Thank you for allowing PMT to assist in the care of this patient.  Cynthia Cogle M. Tamala Julian, FNP-BC Palliative Medicine Team Team Phone: [phone] 15 minutes  Greater than 50%  of this time was spent counseling and coordinating care related to the above assessment and plan.

## 2021-11-09 NOTE — TOC Initial Note (Signed)
Transition of Care [facility]) - Initial/Assessment Note    Patient Details  Name: Claudia Keller MRN: [id number] Date of Birth: 09/04/1951  Transition of Care [facility]) CM/SW Contact:    Tresa Endo Phone Number: 11/09/2021, 6:16 PM  Clinical Narrative:                 Pt is not medically ready for DC. Pt is from [facility] and will return when medically stable. Pt does not have any PT recommendations at this time.  Expected Discharge Plan: [place] Barriers to Discharge: Continued Medical Work up   Patient Goals and CMS Choice Patient states their goals for this hospitalization and ongoing recovery are:: [facility] to [facility].gov Compare Post Acute Care list provided to:: Patient Choice offered to / list presented to : Patient  Expected Discharge Plan and Services Expected Discharge Plan: [place] Choice: [place] Living arrangements for the past 2 months: [place]                                      Prior Living Arrangements/Services Living arrangements for the past 2 months: [place] Lives with:: Facility Resident Patient language and need for interpreter reviewed:: Yes Do you feel safe going back to the place where you live?: Yes      Need for Family Participation in Patient Care: Yes (Comment) Care giver support system in place?: Yes (comment)   Criminal Activity/Legal Involvement Pertinent to Current Situation/Hospitalization: No - Comment as needed  Activities of Daily Living Home Assistive Devices/Equipment: None ADL Screening (condition at time of admission) Patient's cognitive ability adequate to safely complete daily activities?: No Is the patient deaf or have difficulty hearing?: No Does the patient have difficulty seeing, even when wearing glasses/contacts?: No Does the patient have difficulty concentrating, remembering, or making  decisions?: Yes Patient able to express need for assistance with ADLs?: Yes Does the patient have difficulty dressing or bathing?: Yes Independently performs ADLs?: No Communication: Independent Dressing (OT): Dependent Is this a change from baseline?: Pre-admission baseline Grooming: Dependent Is this a change from baseline?: Pre-admission baseline Feeding: Dependent Is this a change from baseline?: Pre-admission baseline Bathing: Dependent Is this a change from baseline?: Pre-admission baseline Toileting: Dependent Is this a change from baseline?: Pre-admission baseline In/Out Bed: Dependent Is this a change from baseline?: Pre-admission baseline Walks in Home: Dependent Is this a change from baseline?: Pre-admission baseline Does the patient have difficulty walking or climbing stairs?: Yes Weakness of Legs: Left Weakness of Arms/Hands: Left  Permission Sought/Granted Permission sought to share information with : Facility Sport and exercise psychologist, Family Supports Permission granted to share information with : Yes, Verbal Permission Granted  Share Information with NAME: Lillie Columbia [phone]  Permission granted to share info w AGENCY: [name]  Permission granted to share info w Relationship: Lillie Columbia [phone]  Permission granted to share info w Contact Information: Lillie Columbia [phone]  Emotional Assessment Appearance:: Appears stated age Attitude/Demeanor/Rapport: Unable to Assess Affect (typically observed): Unable to Assess Orientation: : Oriented to Self, Oriented to Place Alcohol / Substance Use: Not Applicable Psych Involvement: No (comment)  Admission diagnosis:  Hypoxemia [R09.02] Pericardial effusion [I31.39] Disorientation [R41.0] Pleural effusion on left [J90] (HFpEF) heart failure with preserved ejection fraction ([place]) [I50.30] Patient Active Problem List   Diagnosis Date Noted   Metabolic encephalopathy  11/09/2021   PAF (paroxysmal  atrial fibrillation) ([place]) 11/09/2021   Pericardial effusion    (HFpEF) heart failure with preserved ejection fraction ([place]) 11/05/2021   Pericarditis 11/05/2021   Hypoxemia 11/05/2021   Dementia ([place]) 11/05/2021   Urinary tract infection 11/05/2021   CKD (chronic kidney disease) stage 3, GFR 30-59 ml/min (HCC) 11/05/2021   Prolonged QT interval 11/05/2021   AMS (altered mental status) 11/05/2021   B12 deficiency 09/04/2021   CVA (cerebral vascular accident) ([place]) 09/02/2021   Pneumonia due to COVID-19 virus 12/07/2020   Leukocytosis 12/07/2020   Hyperglycemia due to diabetes mellitus ([place]) 12/07/2020   Hypoalbuminemia 12/07/2020   Elevated AST (SGOT) 12/07/2020   Obesity (BMI 30-39.9) 12/07/2020   COPD (chronic obstructive pulmonary disease) ([place]) 12/07/2020   GERD (gastroesophageal reflux disease) 12/07/2020   Diabetic neuropathy ([place]) 12/07/2020   Depression 12/07/2020   Anxiety 12/07/2020   Acute respiratory disease due to COVID-19 virus 12/07/2020   Acute respiratory failure with hypoxia ([place]) 12/01/2020   Segmental colitis without complication (HCC)    Class 2 obesity due to excess calories with body mass index (BMI) of 37.0 to 37.9 in adult    Ischemic colitis ([place]) 03/19/2020   Noncompliance with medication regimen 03/18/2020   Acute renal failure superimposed on stage 3a chronic kidney disease ([place]) 03/18/2020   DKA (diabetic ketoacidoses) 03/17/2020   Bipolar II disorder ([place]) 04/17/2018   PTSD (post-traumatic stress disorder) 04/17/2018   Personal history of noncompliance with medical treatment, presenting hazards to health 08/17/2016   Emesis 05/20/2016   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 ([place]) 09/08/2015   Essential hypertension, benign 09/08/2015   Primary hypothyroidism 09/08/2015   Diverticulosis of colon without hemorrhage    Nausea vomiting and diarrhea 10/27/2011   Chest pain 06/24/2011   Hypercholesterolemia 06/24/2011   Tobacco abuse 06/24/2011    IRRITABLE BOWEL SYNDROME 11/12/2009   Diarrhea 11/12/2009   OTHER SYMPTOMS INVOLVING DIGESTIVE SYSTEM OTHER 11/12/2009   History of colonic polyps 11/12/2009   Closed fracture of metatarsal bone 11/30/2007   HIGH BLOOD PRESSURE 11/30/2007   PCP:  [facility], [place] [address] Phone: [phone] Fax: [phone]     Social Determinants of Health (SDOH) Interventions    Readmission Risk Interventions Readmission Risk Prevention Plan 12/08/2020  Transportation Screening Complete  HRI or Home Care Consult Complete  Social Work Consult for [place] Planning/Counseling Complete  Palliative Care Screening Not Applicable  Medication Review Press photographer) Complete  Some recent data might be hidden

## 2021-11-09 NOTE — Progress Notes (Signed)
PROGRESS NOTE    Claudia Keller  ESP:[id number] DOB: [birth year] DOA: 11/04/2021 PCP: [facility] [place]    Brief Narrative:  71 year old with history of hypertension, COPD, type 2 diabetes not on treatment, chronic diastolic heart failure, hypothyroidism, recent stroke with left hemiparesis, history of atrial fibrillation and currently a long-term nursing home resident brought from facility with sodium 146 and hypoxemic 87% on room air.  Family reported not acting quite right since last few days.  Patient is confused.  Further history taken from patient's son. In the emergency room CT scan of the chest abdomen pelvis was done, no evidence of pulmonary embolism.  She was found to have large pericardial effusion on CT scan.  She is on room air.  Electrolytes are normal.  COVID-19 negative.  Urinalysis abnormal.  Admitted to the hospital with UTI, metabolic encephalopathy. Patient was treated with Rocephin for UTI.  Which has since completed.  Echocardiogram showed large pericardial effusion, pericardiocentesis was performed on 12/30, removed 450 mL of bloody fluids.  Pericardial drain was kept, and removed on 11/08/2021.   Assessment & Plan:   Principal Problem:   Urinary tract infection Active Problems:   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 (HCC)   Essential hypertension, benign   COPD (chronic obstructive pulmonary disease) (HCC)   (HFpEF) heart failure with preserved ejection fraction (HCC)   Pericarditis   Hypoxemia   CKD (chronic kidney disease) stage 3, GFR 30-59 ml/min (HCC)   Prolonged QT interval   AMS (altered mental status)   Pericardial effusion   Metabolic encephalopathy   PAF (paroxysmal atrial fibrillation) (HCC)  Metabolic encephalopathy secondary to infection.  UTI. Condition had improved, will discontinue antibiotics.   Hemopericardium/pericardial effusion. Status post pericardiotomy/doing placement. 420 mL of bloody fluid was  initially removed.  Patient condition appears to be stable, repeat echocardiogram ordered by cardiology for tomorrow. Continued NSAIDs and colchicine.   Paroxysmal atrial fibrillation. Subacute strokes. Vascular dementia secondary to multiple strokes. Strokes probably due to atrial fibrillation with emboli. Cardiology is considering restart anticoagulation after echocardiogram is repeated.   Acute on chronic diastolic congestive heart failure. Ruled in. Acute hypoxemic respiratory failure secondary to congestive heart failure with COPD. POA. Condition had improved.  No longer has any volume overload.   Acute kidney injury. Renal function has normalized.  COPD  Will obtain home oxygen evaluation before discharge  Type 2 diabetes Change in treatment regimen.      DVT prophylaxis: SCDs Code Status: full Family Communication: Son updated on the phone. Disposition Plan: Patient came from nursing home, will transfer back to nursing home when medically stable.   Status is: Inpatient  Remains inpatient appropriate because:         I/O last 3 completed shifts: In: 1100.2 [P.O.:900; IV Piggyback:200.2] Out: 38 [Urine:900; Drains:2] Total I/O In: 0  Out: 250 [Urine:250]     Consultants:  Cardiology  Procedures: Pericardiocentesis  Antimicrobials: None  Subjective: Patient doing well, she has some baseline confusion.  No agitation. She denies any short of breath or cough. No abdominal pain or nausea vomiting. No fever or chills.  Objective: Vitals:   11/09/21 0035 11/09/21 0500 11/09/21 0506 11/09/21 0751  BP: (!) 153/64  (!) 142/79 (!) 116/56  Pulse: 76  78 72  Resp: 16  20 15   Temp: (!) 97.5 F (36.4 C)  97.8 F (36.6 C) 97.6 F (36.4 C)  TempSrc: Oral  Oral Oral  SpO2: 95%  96% 94%  Weight: 66 kg 66 kg    Height:        Intake/Output Summary (Last 24 hours) at 11/09/2021 1014 Last data filed at 11/09/2021 0911 Gross per 24 hour  Intake 760 ml   Output 1000 ml  Net -240 ml   Filed Weights   11/08/21 1949 11/09/21 0035 11/09/21 0500  Weight: 66 kg 66 kg 66 kg    Examination:  General exam: Appears calm and comfortable  Respiratory system: Clear to auscultation. Respiratory effort normal. Cardiovascular system: S1 & S2 heard, RRR. No JVD, murmurs, rubs, gallops or clicks. No pedal edema. Gastrointestinal system: Abdomen is nondistended, soft and nontender. No organomegaly or masses felt. Normal bowel sounds heard. Central nervous system: Alert and oriented x2. No focal neurological deficits. Extremities: Symmetric 5 x 5 power. Skin: No rashes, lesions or ulcers Psychiatry: Judgement and insight appear normal. Mood & affect appropriate.     Data Reviewed: I have personally reviewed following labs and imaging studies  CBC: Recent Labs  Lab 11/04/21 1959 11/04/21 2006 11/05/21 0425 11/06/21 0319  WBC 7.5  --  7.6 7.1  NEUTROABS 5.0  --   --  3.6  HGB 12.6 11.6* 10.7* 11.3*  HCT 38.1 34.0* 32.3* 33.3*  MCV 95.3  --  96.1 92.8  PLT 290  --  272 332   Basic Metabolic Panel: Recent Labs  Lab 11/04/21 1959 11/04/21 2006 11/05/21 0425 11/06/21 0319  NA 136 139 133* 136  K 4.6 4.5 4.6 4.1  CL 99  --  96* 97*  CO2 28  --  26 31  GLUCOSE 164*  --  210* 172*  BUN 63*  --  54* 44*  CREATININE 1.25*  --  1.05* 0.99  CALCIUM 10.1  --  9.1 10.3  MG  --   --   --  1.7  PHOS  --   --   --  2.6   GFR: Estimated Creatinine Clearance: 49.4 mL/min (by C-G formula based on SCr of 0.99 mg/dL). Liver Function Tests: Recent Labs  Lab 11/04/21 1959 11/05/21 0425  AST 42* 44*  ALT 25 27  ALKPHOS 70 61  BILITOT 0.7 1.1  PROT 6.2* 5.5*  ALBUMIN 2.4* 2.2*   No results for input(s): LIPASE, AMYLASE in the last 168 hours. No results for input(s): AMMONIA in the last 168 hours. Coagulation Profile: No results for input(s): INR, PROTIME in the last 168 hours. Cardiac Enzymes: No results for input(s): CKTOTAL, CKMB,  CKMBINDEX, TROPONINI in the last 168 hours. BNP (last 3 results) No results for input(s): PROBNP in the last 8760 hours. HbA1C: No results for input(s): HGBA1C in the last 72 hours. CBG: Recent Labs  Lab 11/08/21 1127 11/08/21 1134 11/08/21 1614 11/08/21 2131 11/09/21 0618  GLUCAP 302* 284* 155* 232* 174*   Lipid Profile: No results for input(s): CHOL, HDL, LDLCALC, TRIG, CHOLHDL, LDLDIRECT in the last 72 hours. Thyroid Function Tests: No results for input(s): TSH, T4TOTAL, FREET4, T3FREE, THYROIDAB in the last 72 hours. Anemia Panel: No results for input(s): VITAMINB12, FOLATE, FERRITIN, TIBC, IRON, RETICCTPCT in the last 72 hours. Sepsis Labs: No results for input(s): PROCALCITON, LATICACIDVEN in the last 168 hours.  Recent Results (from the past 240 hour(s))  Urine Culture     Status: Abnormal   Collection Time: 11/04/21  7:17 PM   Specimen: Urine, Catheterized  Result Value Ref Range Status   Specimen Description URINE, CATHETERIZED  Final   Special Requests  Final    NONE Performed at [facility], [place] [address], [place], [place] [address]    Culture MULTIPLE SPECIES PRESENT, SUGGEST RECOLLECTION (A)  Final   Report Status 11/05/2021 FINAL  Final  Resp Panel by RT-PCR (Flu A&B, Covid) Nasopharyngeal Swab     Status: None   Collection Time: 11/04/21  7:23 PM   Specimen: Nasopharyngeal Swab; Nasopharyngeal(NP) swabs in vial transport medium  Result Value Ref Range Status   SARS Coronavirus 2 by RT PCR NEGATIVE NEGATIVE Final    Comment: (NOTE) SARS-CoV-2 target nucleic acids are NOT DETECTED.  The SARS-CoV-2 RNA is generally detectable in upper respiratory specimens during the acute phase of infection. The lowest concentration of SARS-CoV-2 viral copies this assay can detect is 138 copies/mL. A negative result does not preclude SARS-Cov-2 infection and should not be used as the sole basis for treatment or other patient management decisions. A negative  result may occur with  improper specimen collection/handling, submission of specimen other than nasopharyngeal swab, presence of viral mutation(s) within the areas targeted by this assay, and inadequate number of viral copies(<138 copies/mL). A negative result must be combined with clinical observations, patient history, and epidemiological information. The expected result is Negative.  Fact Sheet for Patients:  EntrepreneurPulse.com.au  Fact Sheet for Healthcare Providers:  IncredibleEmployment.be  This test is no t yet approved or cleared by the [place] FDA and  has been authorized for detection and/or diagnosis of SARS-CoV-2 by FDA under an Emergency Use Authorization (EUA). This EUA will remain  in effect (meaning this test can be used) for the duration of the COVID-19 declaration under Section 564(b)(1) of the Act, 21 U.S.C.section 360bbb-3(b)(1), unless the authorization is terminated  or revoked sooner.       Influenza A by PCR NEGATIVE NEGATIVE Final   Influenza B by PCR NEGATIVE NEGATIVE Final    Comment: (NOTE) The Xpert Xpress SARS-CoV-2/FLU/RSV plus assay is intended as an aid in the diagnosis of influenza from Nasopharyngeal swab specimens and should not be used as a sole basis for treatment. Nasal washings and aspirates are unacceptable for Xpert Xpress SARS-CoV-2/FLU/RSV testing.  Fact Sheet for Patients: EntrepreneurPulse.com.au  Fact Sheet for Healthcare Providers: IncredibleEmployment.be  This test is not yet approved or cleared by the [place] FDA and has been authorized for detection and/or diagnosis of SARS-CoV-2 by FDA under an Emergency Use Authorization (EUA). This EUA will remain in effect (meaning this test can be used) for the duration of the COVID-19 declaration under Section 564(b)(1) of the Act, 21 U.S.C. section 360bbb-3(b)(1), unless the authorization is  terminated or revoked.  Performed at [facility], [place] [address], [place], [place] [address]   Body fluid culture w Gram Stain     Status: None (Preliminary result)   Collection Time: 11/06/21  5:00 PM   Specimen: PATH Cytology Misc. fluid; Body Fluid  Result Value Ref Range Status   Specimen Description FLUID  Final   Special Requests PERICARDIOCENTESIS  Final   Gram Stain   Final    NO SQUAMOUS EPITHELIAL CELLS SEEN FEW WBC SEEN NO ORGANISMS SEEN    Culture   Final    NO GROWTH 3 DAYS Performed at [facility], [address], [place], [place] [address]    Report Status PENDING  Incomplete  MRSA Next Gen by PCR, Nasal     Status: None   Collection Time: 11/06/21  8:31 PM   Specimen: Nasal Mucosa; Nasal  Swab  Result Value Ref Range Status   MRSA by PCR Next Gen NOT DETECTED NOT DETECTED Final    Comment: (NOTE) The GeneXpert MRSA Assay (FDA approved for NASAL specimens only), is one component of a comprehensive MRSA colonization surveillance program. It is not intended to diagnose MRSA infection nor to guide or monitor treatment for MRSA infections. Test performance is not FDA approved in patients less than 34 years old. Performed at [facility], [place] [address] [place], [place] [address]   Resp Panel by RT-PCR (Flu A&B, Covid) Nasopharyngeal Swab     Status: None   Collection Time: 11/08/21 12:03 PM   Specimen: Nasopharyngeal Swab; Nasopharyngeal(NP) swabs in vial transport medium  Result Value Ref Range Status   SARS Coronavirus 2 by RT PCR NEGATIVE NEGATIVE Final    Comment: (NOTE) SARS-CoV-2 target nucleic acids are NOT DETECTED.  The SARS-CoV-2 RNA is generally detectable in upper respiratory specimens during the acute phase of infection. The lowest concentration of SARS-CoV-2 viral copies this assay can detect is 138 copies/mL. A negative result does not preclude SARS-Cov-2 infection and should not be used as the sole basis for treatment  or other patient management decisions. A negative result may occur with  improper specimen collection/handling, submission of specimen other than nasopharyngeal swab, presence of viral mutation(s) within the areas targeted by this assay, and inadequate number of viral copies(<138 copies/mL). A negative result must be combined with clinical observations, patient history, and epidemiological information. The expected result is Negative.  Fact Sheet for Patients:  EntrepreneurPulse.com.au  Fact Sheet for Healthcare Providers:  IncredibleEmployment.be  This test is no t yet approved or cleared by the [place] FDA and  has been authorized for detection and/or diagnosis of SARS-CoV-2 by FDA under an Emergency Use Authorization (EUA). This EUA will remain  in effect (meaning this test can be used) for the duration of the COVID-19 declaration under Section 564(b)(1) of the Act, 21 U.S.C.section 360bbb-3(b)(1), unless the authorization is terminated  or revoked sooner.       Influenza A by PCR NEGATIVE NEGATIVE Final   Influenza B by PCR NEGATIVE NEGATIVE Final    Comment: (NOTE) The Xpert Xpress SARS-CoV-2/FLU/RSV plus assay is intended as an aid in the diagnosis of influenza from Nasopharyngeal swab specimens and should not be used as a sole basis for treatment. Nasal washings and aspirates are unacceptable for Xpert Xpress SARS-CoV-2/FLU/RSV testing.  Fact Sheet for Patients: EntrepreneurPulse.com.au  Fact Sheet for Healthcare Providers: IncredibleEmployment.be  This test is not yet approved or cleared by the [place] FDA and has been authorized for detection and/or diagnosis of SARS-CoV-2 by FDA under an Emergency Use Authorization (EUA). This EUA will remain in effect (meaning this test can be used) for the duration of the COVID-19 declaration under Section 564(b)(1) of the Act, 21 U.S.C. section  360bbb-3(b)(1), unless the authorization is terminated or revoked.  Performed at [facility], [place] [address], [place], [place] [address]          Radiology Studies: No results found.      Scheduled Meds:  atorvastatin  80 mg Oral QHS   calcium-vitamin D  1 tablet Oral BID   Chlorhexidine Gluconate Cloth  6 each Topical Daily   colchicine  0.6 mg Oral Daily   diltiazem  120 mg Oral Daily   divalproex  1,000 mg Oral QPM   divalproex  250 mg Oral Daily   insulin aspart  0-5 Units Subcutaneous  QHS   insulin aspart  0-9 Units Subcutaneous TID WC   levothyroxine  200 mcg Oral QAC breakfast   mirtazapine  15 mg Oral QHS   mupirocin ointment  1 application Nasal BID   pantoprazole  40 mg Oral QAC breakfast   PARoxetine  40 mg Oral Daily   pneumococcal 23 valent vaccine  0.5 mL Intramuscular Tomorrow-1000   QUEtiapine  100 mg Oral QHS   senna-docusate  2 tablet Oral BID   sodium chloride flush  3 mL Intravenous Q12H   cyanocobalamin  2,000 mcg Oral Daily   Continuous Infusions:  sodium chloride 250 mL (11/08/21 2138)   cefTRIAXone (ROCEPHIN)  IV Stopped (11/08/21 2213)     LOS: 4 days    Time spent: 27 minutes    Sharen Hones, MD [facility]   To contact the attending provider between 7A-7P or the covering provider during after hours 7P-7A, please log into the web site [email or web address] and access using universal [place] password for that web site. If you do not have the password, please call the hospital operator.  11/09/2021, 10:14 AM

## 2021-11-09 NOTE — Progress Notes (Signed)
Progress Note  Patient ID   Claudia Keller is a 71 y.o. female with a hx of PAF on Eliquis, hypothyroid, COPD, DM2, HTN, IBS, HLD, OA, who is being seen 11/06/2021 for the evaluation of pericardial effusion, at the request of Dr Sloan Leiter.  Subjective   Awake and somewhat alert today.  She knows that she is in [place], and the hospital.  She knows that it is the wintertime and we just had Christmas. Denies any chest pain or pressure.  No dyspnea.  Inpatient Medications    Scheduled Meds:  atorvastatin  80 mg Oral QHS   calcium-vitamin D  1 tablet Oral BID   Chlorhexidine Gluconate Cloth  6 each Topical Daily   colchicine  0.6 mg Oral Daily   diltiazem  120 mg Oral Daily   divalproex  1,000 mg Oral QPM   divalproex  250 mg Oral Daily   insulin aspart  0-5 Units Subcutaneous QHS   insulin aspart  0-9 Units Subcutaneous TID WC   levothyroxine  200 mcg Oral QAC breakfast   mirtazapine  15 mg Oral QHS   mupirocin ointment  1 application Nasal BID   pantoprazole  40 mg Oral QAC breakfast   PARoxetine  40 mg Oral Daily   pneumococcal 23 valent vaccine  0.5 mL Intramuscular Tomorrow-1000   QUEtiapine  100 mg Oral QHS   senna-docusate  2 tablet Oral BID   sodium chloride flush  3 mL Intravenous Q12H   cyanocobalamin  2,000 mcg Oral Daily   Continuous Infusions:  sodium chloride 250 mL (11/08/21 2138)   cefTRIAXone (ROCEPHIN)  IV Stopped (11/08/21 2213)   PRN Meds: sodium chloride, acetaminophen **OR** acetaminophen, albuterol, sodium chloride flush   Vital Signs    Vitals:   11/09/21 0035 11/09/21 0500 11/09/21 0506 11/09/21 0751  BP: (!) 153/64  (!) 142/79 (!) 116/56  Pulse: 76  78 72  Resp: 16  20 15   Temp: (!) 97.5 F (36.4 C)  97.8 F (36.6 C) 97.6 F (36.4 C)  TempSrc: Oral  Oral Oral  SpO2: 95%  96% 94%  Weight: 66 kg 66 kg    Height:        Intake/Output Summary (Last 24 hours) at 11/09/2021 0811 Last data filed at 11/09/2021 0615 Gross per 24 hour  Intake  760 ml  Output 750 ml  Net 10 ml   Filed Weights   11/08/21 1949 11/09/21 0035 11/09/21 0500  Weight: 66 kg 66 kg 66 kg    Telemetry    NSR-80s- Personally Reviewed  No new EKG.   Physical Exam     GEN-frail, thin elderly woman.  Edentulous makes it appear that she has a facial droop.  No obvious distress, somewhat confused, but alert and oriented times person and place.  Oriented to season Head- normocephalic, atraumatic; edentulous Neck: Supple, no JVD or bruit. Lungs-CTA B, nonlabored, good air movement. Heart -RRR, normal S1 and S2.  Soft rub but no murmurs or gallops. Abdomen: Soft/NT/ND STEMI BS.  No HSM. Neuropsych: Still a bit confused, flat affect  Labs    Chemistry Recent Labs  Lab 11/04/21 1959 11/04/21 2006 11/05/21 0425 11/06/21 0319  NA 136 139 133* 136  K 4.6 4.5 4.6 4.1  CL 99  --  96* 97*  CO2 28  --  26 31  GLUCOSE 164*  --  210* 172*  BUN 63*  --  54* 44*  CREATININE 1.25*  --  1.05* 0.99  CALCIUM 10.1  --  9.1 10.3  PROT 6.2*  --  5.5*  --   ALBUMIN 2.4*  --  2.2*  --   AST 42*  --  44*  --   ALT 25  --  27  --   ALKPHOS 70  --  61  --   BILITOT 0.7  --  1.1  --   GFRNONAA 47*  --  58* >60  ANIONGAP 9  --  11 8     Hematology Recent Labs  Lab 11/04/21 1959 11/04/21 2006 11/05/21 0425 11/06/21 0319  WBC 7.5  --  7.6 7.1  RBC 4.00  --  3.36* 3.59*  HGB 12.6 11.6* 10.7* 11.3*  HCT 38.1 34.0* 32.3* 33.3*  MCV 95.3  --  96.1 92.8  MCH 31.5  --  31.8 31.5  MCHC 33.1  --  33.1 33.9  RDW 15.5  --  15.1 14.8  PLT 290  --  272 264     Assessment & Plan   Principal Problem:   Urinary tract infection Active Problems:   Pericardial effusion   Metabolic encephalopathy   PAF (paroxysmal atrial fibrillation) (HCC)   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 (HCC)   Essential hypertension, benign   COPD (chronic obstructive pulmonary disease) (HCC)   (HFpEF) heart failure with preserved ejection fraction (HCC)   Pericarditis    Hypoxemia   CKD (chronic kidney disease) stage 3, GFR 30-59 ml/min (HCC)   Prolonged QT interval   AMS (altered mental status)   Principal Problem:   Urinary tract infection - Abx per TRH     Metabolic encephalopathy -likely related to infection.  Per primary service.   Active Problems:   Large Pericardial effusion / Pericarditis-> pericardiocentesis on 11/06/2021.  Minimal residual effusion noted on immediate post procedure echocardiogram.  Drain removed 11/08/2021. ->  Follow-up echocardiogram now pending. Bloody fluid drained, concern for possible hemorrhagic effusion -> pending follow-up echocardiogram-likely will not be done until tomorrow. Continue treatment for pericarditis with colchicine and NSAIDs -> 2 months colchicine, NSAIDs taper over 3 weeks.    PAF (paroxysmal atrial fibrillation) (HCC) -on low-dose diltiazem for rate control, no recurrent breakthroughs.  Maintaining sinus rhythm. With hemorrhagic pericarditis, will hold off on DOAC for now  -  Pending reassessment of echocardiogram. ->  We will consider restarting review 48 hours following repeat echo, depending on results No plans to bridge with heparin.     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 (HCC) -> Per primary service    Essential hypertension, benign -> relatively stable blood pressures on diltiazem.  Would not be overly aggressive.    COPD (chronic obstructive pulmonary disease) (HCC)    (HFpEF) heart failure with preserved ejection fraction (HCC) -related to pericardial effusion. No active symptoms following pericardiocentesis.  No longer on diuretic.    Cardiology following along pending repeat echo.   Glenetta Hew, MD  11/09/2021 8:11 AM

## 2021-11-09 NOTE — TOC Progression Note (Addendum)
Transition of Care [facility]) - Progression Note    Patient Details  Name: Claudia Keller MRN: [id number] Date of Birth: December 07, 1950  [facility] CM/SW Contact  Zenon Mayo, RN Phone Number: 11/09/2021, 3:53 PM  Clinical Narrative:    From [facility] SNF long term , pericardial effusion, met enceph, HF, has hx of CVA, has pericardial drain, patient is bedbound.  TOC will continue to follow for dc needs.         Expected Discharge Plan and Services                                                 Social Determinants of Health (SDOH) Interventions    Readmission Risk Interventions Readmission Risk Prevention Plan 12/08/2020  Transportation Screening Complete  HRI or [place] Complete  Social Work Consult for [place] Planning/Counseling Complete  Palliative Care Screening Not Applicable  Medication Review [employer] photographer) Complete  Some recent data might be hidden

## 2021-11-09 NOTE — Progress Notes (Signed)
*  PRELIMINARY RESULTS* Echocardiogram Limited 2-D Echocardiogram  has been performed.  Samuel Germany 11/09/2021, 4:03 PM

## 2021-11-09 NOTE — NC FL2 (Signed)
[place] LEVEL OF CARE SCREENING TOOL     IDENTIFICATION  Patient Name: Claudia Keller Birthdate: 1951-10-24 Sex: female Admission Date (Current Location): 11/04/2021  [facility] and [place] Number:  Herbalist and Address:  [facility] [place], [place] [address] [place], [place] [address]      Provider Number: [id number]  Attending Physician Name and Address:  Sharen Hones, MD  Relative Name and Phone Number:  Reyne Dumas [phone])   [phone] [facility] Phone)    Current Level of Care: Hospital Recommended Level of Care: [place] Prior Approval Number:    Date Approved/Denied:   PASRR Number:    Discharge Plan: SNF    Current Diagnoses: Patient Active Problem List   Diagnosis Date Noted   Metabolic encephalopathy 51/76/1607   PAF (paroxysmal atrial fibrillation) ([place]) 11/09/2021   Pericardial effusion    (HFpEF) heart failure with preserved ejection fraction ([place]) 11/05/2021   Pericarditis 11/05/2021   Hypoxemia 11/05/2021   Dementia ([place]) 11/05/2021   Urinary tract infection 11/05/2021   CKD (chronic kidney disease) stage 3, GFR 30-59 ml/min ([place]) 11/05/2021   Prolonged QT interval 11/05/2021   AMS (altered mental status) 11/05/2021   B12 deficiency 09/04/2021   CVA (cerebral vascular accident) ([place]) 09/02/2021   Pneumonia due to COVID-19 virus 12/07/2020   Leukocytosis 12/07/2020   Hyperglycemia due to diabetes mellitus ([place]) 12/07/2020   Hypoalbuminemia 12/07/2020   Elevated AST (SGOT) 12/07/2020   Obesity (BMI 30-39.9) 12/07/2020   COPD (chronic obstructive pulmonary disease) ([place]) 12/07/2020   GERD (gastroesophageal reflux disease) 12/07/2020   Diabetic neuropathy ([place]) 12/07/2020   Depression 12/07/2020   Anxiety 12/07/2020   Acute respiratory disease due to COVID-19 virus 12/07/2020   Acute respiratory failure with hypoxia ([place]) 12/01/2020   Segmental colitis without complication (HCC)    Class 2 obesity  due to excess calories with body mass index (BMI) of 37.0 to 37.9 in adult    Ischemic colitis ([place]) 03/19/2020   Noncompliance with medication regimen 03/18/2020   Acute renal failure superimposed on stage 3a chronic kidney disease ([place]) 03/18/2020   DKA (diabetic ketoacidoses) 03/17/2020   Bipolar II disorder ([place]) 04/17/2018   PTSD (post-traumatic stress disorder) 04/17/2018   Personal history of noncompliance with medical treatment, presenting hazards to health 08/17/2016   Emesis 05/20/2016   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 ([place]) 09/08/2015   Essential hypertension, benign 09/08/2015   Primary hypothyroidism 09/08/2015   Diverticulosis of colon without hemorrhage    Nausea vomiting and diarrhea 10/27/2011   Chest pain 06/24/2011   Hypercholesterolemia 06/24/2011   Tobacco abuse 06/24/2011   IRRITABLE BOWEL SYNDROME 11/12/2009   Diarrhea 11/12/2009   OTHER SYMPTOMS INVOLVING DIGESTIVE SYSTEM OTHER 11/12/2009   History of colonic polyps 11/12/2009   Closed fracture of metatarsal bone 11/30/2007   HIGH BLOOD PRESSURE 11/30/2007    Orientation RESPIRATION BLADDER Height & Weight     Self, Place  Normal Incontinent, External catheter Weight: 145 lb 8.1 oz (66 kg) Height:  5' 3.39" (161 cm)  BEHAVIORAL SYMPTOMS/MOOD NEUROLOGICAL BOWEL NUTRITION STATUS      Continent Diet (See DC Summary)  AMBULATORY STATUS COMMUNICATION OF NEEDS Skin   Limited Assist Verbally Surgical wounds                       Personal Care Assistance Level of Assistance  Bathing, Feeding, Dressing Bathing Assistance: Limited assistance Feeding assistance: Independent Dressing Assistance:  Limited assistance     Functional Limitations Info  Sight, Hearing, Speech Sight Info: Impaired Hearing Info: Adequate Speech Info: Adequate    SPECIAL CARE FACTORS FREQUENCY                       Contractures Contractures Info: Not present    Additional Factors Info  Code Status,  Allergies Code Status Info: Full Allergies Info: NKA           Current Medications (11/09/2021):  This is the current hospital active medication list Current Facility-Administered Medications  Medication Dose Route Frequency Provider Last Rate Last Admin   0.9 %  sodium chloride infusion  250 mL Intravenous PRN Jettie Booze, MD 10 mL/hr at 11/08/21 2138 250 mL at 11/08/21 2138   acetaminophen (TYLENOL) tablet 650 mg  650 mg Oral Q6H PRN Jettie Booze, MD   650 mg at 11/06/21 2139   Or   acetaminophen (TYLENOL) suppository 650 mg  650 mg Rectal Q6H PRN Jettie Booze, MD       albuterol (PROVENTIL) (2.5 MG/3ML) 0.083% nebulizer solution 2.5 mg  2.5 mg Inhalation Q4H PRN Jettie Booze, MD       atorvastatin (LIPITOR) tablet 80 mg  80 mg Oral QHS Jettie Booze, MD   80 mg at 11/08/21 2143   calcium-vitamin D (OSCAL WITH D) 500-5 MG-MCG per tablet 1 tablet  1 tablet Oral BID Jettie Booze, MD   1 tablet at 11/09/21 1610   Chlorhexidine Gluconate Cloth 2 % PADS 6 each  6 each Topical Daily Barb Merino, MD   6 each at 11/09/21 9604   colchicine tablet 0.6 mg  0.6 mg Oral Daily Jettie Booze, MD   0.6 mg at 11/09/21 5409   diltiazem (CARDIZEM CD) 24 hr capsule 120 mg  120 mg Oral Daily Jettie Booze, MD   120 mg at 11/09/21 0826   divalproex (DEPAKOTE ER) 24 hr tablet 1,000 mg  1,000 mg Oral QPM Jettie Booze, MD   1,000 mg at 11/09/21 1723   divalproex (DEPAKOTE) DR tablet 250 mg  250 mg Oral Daily Jettie Booze, MD   250 mg at 11/09/21 0826   insulin aspart (novoLOG) injection 0-5 Units  0-5 Units Subcutaneous QHS Jettie Booze, MD   2 Units at 11/08/21 2153   insulin aspart (novoLOG) injection 0-9 Units  0-9 Units Subcutaneous TID WC Jettie Booze, MD   2 Units at 11/09/21 1723   levothyroxine (SYNTHROID) tablet 200 mcg  200 mcg Oral QAC breakfast Jettie Booze, MD   200 mcg at 11/09/21 8119   mirtazapine  (REMERON) tablet 15 mg  15 mg Oral QHS Jettie Booze, MD   15 mg at 11/08/21 2143   mupirocin ointment (BACTROBAN) 2 % 1 application  1 application Nasal BID Barb Merino, MD   1 application at 14/78/29 0826   pantoprazole (PROTONIX) EC tablet 40 mg  40 mg Oral QAC breakfast Jettie Booze, MD   40 mg at 11/09/21 5621   PARoxetine (PAXIL) tablet 40 mg  40 mg Oral Daily Jettie Booze, MD   40 mg at 11/09/21 3086   pneumococcal 23 valent vaccine (PNEUMOVAX-23) injection 0.5 mL  0.5 mL Intramuscular Tomorrow-1000 Barb Merino, MD       QUEtiapine (SEROQUEL) tablet 100 mg  100 mg Oral QHS Jettie Booze, MD   100 mg at 11/08/21 2143  senna-docusate (Senokot-S) tablet 2 tablet  2 tablet Oral BID Sharen Hones, MD   2 tablet at 11/09/21 0825   sodium chloride flush (NS) 0.9 % injection 3 mL  3 mL Intravenous Q12H Jettie Booze, MD   3 mL at 11/09/21 0827   sodium chloride flush (NS) 0.9 % injection 3 mL  3 mL Intravenous PRN Jettie Booze, MD   3 mL at 11/05/21 1305   vitamin B-12 (CYANOCOBALAMIN) tablet 2,000 mcg  2,000 mcg Oral Daily Jettie Booze, MD   2,000 mcg at 11/09/21 2320     Discharge Medications: Please see discharge summary for a list of discharge medications.  Relevant Imaging Results:  Relevant Lab Results:   Additional Information SSN: [id number]; Moderna COVID-19 Vaccine 11/16/2019  Reece Agar, LCSWA

## 2021-11-09 NOTE — Progress Notes (Signed)
Orthopedic Tech Progress Note Patient Details:  VALINE DROZDOWSKI 04-03-1951 [id number]  Secretary called requesting a "SOFT BOOT" for patient, I told her those were in materials   Patient ID: [name], female   DOB: 1951/03/16, 71 y.o.   MRN: [id number]  Janit Pagan 11/09/2021, 1:48 PM

## 2021-11-09 NOTE — Care Management Important Message (Signed)
Important Message  Patient Details  Name: Claudia Keller MRN: [id number] Date of Birth: [birth year]   Medicare Important Message Given:  Yes     Memory Argue 11/09/2021, 1:22 PM

## 2021-11-10 ENCOUNTER — Encounter (HOSPITAL_COMMUNITY): Payer: Self-pay | Admitting: Interventional Cardiology

## 2021-11-10 ENCOUNTER — Telehealth: Payer: Self-pay | Admitting: Endocrinology

## 2021-11-10 ENCOUNTER — Other Ambulatory Visit (HOSPITAL_COMMUNITY): Payer: Self-pay

## 2021-11-10 ENCOUNTER — Other Ambulatory Visit: Payer: Self-pay | Admitting: Cardiology

## 2021-11-10 DIAGNOSIS — I3139 Other pericardial effusion (noninflammatory): Secondary | ICD-10-CM

## 2021-11-10 DIAGNOSIS — I480 Paroxysmal atrial fibrillation: Secondary | ICD-10-CM | POA: Diagnosis not present

## 2021-11-10 LAB — GLUCOSE, CAPILLARY
Glucose-Capillary: 172 mg/dL — ABNORMAL HIGH (ref 70–99)
Glucose-Capillary: 245 mg/dL — ABNORMAL HIGH (ref 70–99)
Glucose-Capillary: 216 mg/dL — ABNORMAL HIGH (ref 70–99)

## 2021-11-10 LAB — BODY FLUID CULTURE W GRAM STAIN
Culture: NO GROWTH
Gram Stain: NONE SEEN

## 2021-11-10 MED ORDER — COLCHICINE 0.6 MG PO TABS: 0.6000 mg | ORAL_TABLET | Freq: Every day | ORAL | 1 refills | Status: DC

## 2021-11-10 MED ORDER — INSULIN ASPART 100 UNIT/ML IJ SOLN
0.0000 [IU] | Freq: Three times a day (TID) | INTRAMUSCULAR | Status: DC
  Administered 2021-11-10: 3 [IU] via SUBCUTANEOUS
  Administered 2021-11-10: 2 [IU] via SUBCUTANEOUS
  Administered 2021-11-10: 3 [IU] via SUBCUTANEOUS

## 2021-11-10 MED ORDER — INSULIN DETEMIR 100 UNIT/ML ~~LOC~~ SOLN: 12.0000 [IU] | Freq: Two times a day (BID) | SUBCUTANEOUS | 11 refills | Status: DC

## 2021-11-10 NOTE — TOC Transition Note (Signed)
Transition of Care [facility]) - CM/SW Discharge Note   Patient Details  Name: Claudia Keller MRN: [id number] Date of Birth: [birth year]  [facility] CM/SW Contact:  Tresa Endo Phone Number: 11/10/2021, 1:37 PM   Clinical Narrative:    Patient will DC to: [facility] Anticipated DC date: 11/10/2021 Family notified: Pt son Transport by: Corey Harold   Per MD patient ready for DC to [facility]. RN to call report prior to discharge [phone]). RN, patient, patient's family, and facility notified of DC. Discharge Summary and FL2 sent to facility. DC packet on chart. Ambulance transport requested for patient.   CSW will sign off for now as social work intervention is no longer needed. Please consult [place] again if new needs arise.     Final next level of care: [place] Barriers to Discharge: Continued Medical Work up   Patient Goals and CMS Choice Patient states their goals for this hospitalization and ongoing recovery are:: [email or web address] Compare Post Acute Care list provided to:: Patient Choice offered to / list presented to : Patient  Discharge Placement                       Discharge Plan and Services     Post Acute Care Choice: Skilled Nursing Facility                               Social Determinants of Health (SDOH) Interventions     Readmission Risk Interventions Readmission Risk Prevention Plan 12/08/2020  Transportation Screening Complete  HRI or Home Care Consult Complete  Social Work Consult for [place] Planning/Counseling Complete  Palliative Care Screening Not Applicable  Medication Review Press photographer) Complete  Some recent data might be hidden

## 2021-11-10 NOTE — Discharge Summary (Signed)
Physician Discharge Summary  Patient ID: Claudia Keller MRN: [id number] DOB/AGE: September 14, 1951 71 y.o.  Admit date: 11/04/2021 Discharge date: 11/10/2021  Admission Diagnoses:  Discharge Diagnoses:  Principal Problem:   Urinary tract infection Active Problems:   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 (HCC)   Essential hypertension, benign   COPD (chronic obstructive pulmonary disease) (HCC)   (HFpEF) heart failure with preserved ejection fraction (HCC)   Pericarditis   Hypoxemia   CKD (chronic kidney disease) stage 3, GFR 30-59 ml/min (HCC)   Prolonged QT interval   AMS (altered mental status)   Pericardial effusion   Metabolic encephalopathy   PAF (paroxysmal atrial fibrillation) ([place])  Instruction: Hold Eliquis for now, restart Eliquis in 5 days. Continue colchicine  for 2 months Perform another echocardiogram in 7 days for pericardial effusion.   Discharged Condition: good  Hospital Course:  71 year old with history of hypertension, COPD, type 2 diabetes not on treatment, chronic diastolic heart failure, hypothyroidism, recent stroke with left hemiparesis, history of atrial fibrillation and currently a long-term nursing home resident brought from facility with sodium 146 and hypoxemic 87% on room air.  Family reported not acting quite right since last few days.  Patient is confused.  Further history taken from patient's son. In the emergency room CT scan of the chest abdomen pelvis was done, no evidence of pulmonary embolism.  She was found to have large pericardial effusion on CT scan.  She is on room air.  Electrolytes are normal.  COVID-19 negative.  Urinalysis abnormal.  Admitted to the hospital with UTI, metabolic encephalopathy. Patient was treated with Rocephin for UTI.  Which has since completed.  Echocardiogram showed large pericardial effusion, pericardiocentesis was performed on 12/30, removed 450 mL of bloody fluids.  Pericardial drain was kept, and removed on  11/08/2021.  Metabolic encephalopathy secondary to infection.  UTI. Condition had improved, will discontinue antibiotics.   Hemopericardium/pericardial effusion. Status post pericardiotomy/doing placement. 420 mL of bloody fluid was initially removed.  Repeated echocardiogram today showed minimal pericardial effusion. Discussed with cardiology, patient is medically stable to be discharged. Continue colchicine months. Patient will need follow-up with cardiology in 1 week, also need repeat a limited echocardiogram in 1 week to make sure pericardial fusion is stable. Due to hemorrhagic pericardial effusion, anticoagulation will be on hold for additional 5 days before restarting.  Paroxysmal atrial fibrillation. Subacute strokes. Vascular dementia secondary to multiple strokes. Strokes probably due to atrial fibrillation with emboli. Please restart anticoagulation in 5 days.   Acute on chronic diastolic congestive heart failure. Ruled in. Acute hypoxemic respiratory failure secondary to congestive heart failure with COPD. POA. Condition had improved.  No longer has any volume overload.   Acute kidney injury. Renal function has normalized.   Type 2 diabetes Please note that Levemir is changed to 12 units twice a day.   Consults: cardiology  Significant Diagnostic Studies:   Treatments: Pericardiocentesis.  Discharge Exam: Blood pressure 105/72, pulse 92, temperature 97.8 F (36.6 C), temperature source Oral, resp. rate 19, height 5' 3.39" (1.61 m), weight 65.3 kg, SpO2 95 %. General appearance: alert and cooperative Resp: clear to auscultation bilaterally Cardio: Irregular, no murmurs GI: soft, non-tender; bowel sounds normal; no masses,  no organomegaly Extremities: extremities normal, atraumatic, no cyanosis or edema  Disposition: Discharge disposition: 03-Skilled Nursing Facility       Discharge Instructions     Diet - low sodium heart healthy   Complete by: As  directed  Discharge wound care:   Complete by: As directed    Follow with RN   Increase activity slowly   Complete by: As directed       Allergies as of 11/10/2021   No Known Allergies      Medication List     STOP taking these medications    apixaban 5 MG Tabs tablet Commonly known as: ELIQUIS   insulin detemir 100 UNIT/ML injection Commonly known as: LEVEMIR   lisinopril 40 MG tablet Commonly known as: ZESTRIL       TAKE these medications    acetaminophen 325 MG tablet Commonly known as: TYLENOL Take 650 mg by mouth every 5 (five) hours as needed for mild pain.   albuterol 108 (90 Base) MCG/ACT inhaler Commonly known as: VENTOLIN HFA Inhale 2 puffs into the lungs every 4 (four) hours as needed for wheezing or shortness of breath.   aspirin 81 MG chewable tablet Chew 81 mg by mouth daily.   atorvastatin 80 MG tablet Commonly known as: LIPITOR Take 1 tablet (80 mg total) by mouth at bedtime.   bisacodyl 10 MG suppository Commonly known as: DULCOLAX Place 10 mg rectally daily as needed for moderate constipation or severe constipation. Use only if Milk of Magnesia does not work as needed every 24 hours.   cetirizine 10 MG tablet Commonly known as: ZYRTEC Take 10 mg by mouth daily.   colchicine 0.6 MG tablet Take 1 tablet (0.6 mg total) by mouth daily. Start taking on: November 11, 2021   colestipol 1 g tablet Commonly known as: COLESTID TAKE 2 TABLETS DAILY FOR DIARRHEA. DO NOT TAKE WITHIN 2 HOURS OF OTHER ORAL MEDICATIONS. HOLD FOR CONSTIPATION. What changed:  how much to take how to take this when to take this   cyanocobalamin 2000 MCG tablet Commonly known as: [employer]S VITAMIN B12 Take 1 tablet (2,000 mcg total) by mouth daily.   diltiazem 120 MG 24 hr capsule Commonly known as: Cardizem CD Take 1 capsule (120 mg total) by mouth daily.   divalproex 500 MG 24 hr tablet Commonly known as: DEPAKOTE ER Take 2 tablets (1,000 mg total) by mouth every  evening.   divalproex 250 MG DR tablet Commonly known as: DEPAKOTE Take 1 tablet (250 mg total) by mouth daily.   FreeStyle Libre 2 Sensor Misc 1 Device by Does not apply route every 14 (fourteen) days.   furosemide 20 MG tablet Commonly known as: LASIX Take 1 tablet (20 mg total) by mouth every other day.   levothyroxine 200 MCG tablet Commonly known as: SYNTHROID Take 200 mcg by mouth daily before breakfast.   magnesium hydroxide 400 MG/5ML suspension Commonly known as: MILK OF MAGNESIA Take 30 mLs by mouth daily as needed for mild constipation or moderate constipation. If no BM in 3 days, take 30cc every 24 hours PRN   mirtazapine 15 MG tablet Commonly known as: REMERON Take 15 mg by mouth at bedtime.   nicotine polacrilex 4 MG gum Commonly known as: NICORETTE Take 4 mg by mouth every 2 (two) hours as needed for smoking cessation. **avoid food/drink for 15 minutes before and after**   ondansetron 4 MG tablet Commonly known as: ZOFRAN Take 1 tablet (4 mg total) by mouth every 8 (eight) hours as needed for nausea or vomiting.   Oyster Shell Calcium w/D 500-200 MG-UNIT Tabs Take 1 tablet by mouth 2 (two) times daily.   pantoprazole 40 MG tablet Commonly known as: PROTONIX TAKE 1 TABLET BY MOUTH ONCE  DAILY 30 MINTUES BEFORE BREAKFAST. What changed:  how much to take how to take this when to take this additional instructions   PARoxetine 40 MG tablet Commonly known as: PAXIL Take 1 tablet (40 mg total) by mouth daily.   QUEtiapine 100 MG tablet Commonly known as: SEROQUEL Take 1 tablet (100 mg total) by mouth at bedtime.   Quintet Blood Glucose Test test strip Generic drug: glucose blood Check blood sugar 2 times daily E11.65   RA SALINE ENEMA RE Place 1 Dose rectally once as needed (severe constipation). If no relief from both milk of magnesia, and Bisacodyl suppository, administer one dose of disposable saline enema.   senna-docusate 8.6-50 MG  tablet Commonly known as: Senokot-S Take 1 tablet by mouth at bedtime as needed for mild constipation.               Discharge Care Instructions  (From admission, onward)           Start     Ordered   11/10/21 0000  Discharge wound care:       Comments: Follow with RN   11/10/21 [place], [place] Follow up in 1 week(s).   Contact information: [place] [address] [phone]         Thompson Grayer, MD Follow up in 1 week(s).   Specialty: Cardiology Contact information: [address] [place] [address] [phone]                32 minutes Signed: Sharen Hones 11/10/2021, 12:39 PM

## 2021-11-10 NOTE — Telephone Encounter (Signed)
-----   Message from Vicki Mallet, MD sent at 11/10/2021 10:24 AM EST ----- Hello Dr. Loanne Drilling,   I wanted to make you aware that Ms. Claudia Keller has dis-enrolled in [facility] as of 11/08/21 and transitioned to long-term care at [facility]. Their in-house provider will assume care as her PCP.   Prior to her dis-enrollment, I re-initiated her Libre-2 monitor. The facility RN is capable of replacing the sensor and staff are using the monitor for her glucose checks. They have been instructed to bring the reader to her follow-up appointment with you. If you recommend she continue using this device, she will need an order from your office for the sensors.   Thank you.

## 2021-11-10 NOTE — Progress Notes (Signed)
Heart Failure Nurse Navigator Progress Note  Pt still confused, plans to return to longterm SNF upon discharge per CSW.    No plans for [facility] clinic upon discharge.   HF Navigation team to sign off.   Pricilla Holm, MSN, RN Heart Failure Nurse Navigator [phone]

## 2021-11-10 NOTE — TOC Benefit Eligibility Note (Signed)
Patient Teacher, English as a foreign language completed.    The patient is currently admitted and upon discharge could be taking Colchicine 0.6 mg.  [facility] requires certain doctors and their pharmacies to fill medications.  The patient is insured through [place] [employer] [place], [place] Patient Advocate Specialist [place] Patient Advocate Team Direct Number: [phone]  Fax: [phone]

## 2021-11-10 NOTE — Progress Notes (Addendum)
Progress Note  Patient Name: Claudia Keller Date of Encounter: 11/10/2021  [facility] Cardiologist: None   Subjective   Alert to self and surroundings this morning. No complaints. Wants to eat breakfast, still in mitten restraints.   Inpatient Medications    Scheduled Meds:  atorvastatin  80 mg Oral QHS   calcium-vitamin D  1 tablet Oral BID   Chlorhexidine Gluconate Cloth  6 each Topical Daily   colchicine  0.6 mg Oral Daily   diltiazem  120 mg Oral Daily   divalproex  1,000 mg Oral QPM   divalproex  250 mg Oral Daily   insulin aspart  0-5 Units Subcutaneous QHS   insulin aspart  0-9 Units Subcutaneous TID WC   levothyroxine  200 mcg Oral QAC breakfast   mirtazapine  15 mg Oral QHS   pantoprazole  40 mg Oral QAC breakfast   PARoxetine  40 mg Oral Daily   pneumococcal 23 valent vaccine  0.5 mL Intramuscular Tomorrow-1000   QUEtiapine  100 mg Oral QHS   senna-docusate  2 tablet Oral BID   sodium chloride flush  3 mL Intravenous Q12H   cyanocobalamin  2,000 mcg Oral Daily   Continuous Infusions:  sodium chloride 250 mL (11/08/21 2138)   PRN Meds: sodium chloride, acetaminophen **OR** acetaminophen, albuterol, sodium chloride flush   Vital Signs    Vitals:   11/09/21 0506 11/09/21 0751 11/10/21 0006 11/10/21 0403  BP: (!) 142/79 (!) 116/56 (!) 118/56 (!) 115/55  Pulse: 78 72  87  Resp: 20 15 19 14   Temp: 97.8 F (36.6 C) 97.6 F (36.4 C) 98.2 F (36.8 C) 97.7 F (36.5 C)  TempSrc: Oral Oral Oral Oral  SpO2: 96% 94% 93% 93%  Weight:    65.3 kg  Height:        Intake/Output Summary (Last 24 hours) at 11/10/2021 0846 Last data filed at 11/10/2021 0006 Gross per 24 hour  Intake 440 ml  Output 500 ml  Net -60 ml   Last 3 Weights 11/10/2021 11/09/2021 11/09/2021  Weight (lbs) 143 lb 15.4 oz 145 lb 8.1 oz 145 lb 8.1 oz  Weight (kg) 65.3 kg 66 kg 66 kg  Some encounter information is confidential and restricted. Go to Review Flowsheets activity to see all data.       Telemetry    SR - Personally Reviewed  ECG    No new tracing  Physical Exam   GEN: Thin, frail older female Neck: No JVD Cardiac: RRR, no murmurs, rubs, or gallops.  Respiratory: Clear to auscultation bilaterally. GI: Soft, nontender, non-distended  MS: No edema; No deformity. Neuro:  Nonfocal  Psych: overall alert and oriented, suspect mild confusion at times still  Labs    High Sensitivity Troponin:   Recent Labs  Lab 11/04/21 0034 11/05/21 0135 11/05/21 0425 11/05/21 0700  TROPONINIHS 12 12 12 9      Chemistry Recent Labs  Lab 11/04/21 1959 11/04/21 2006 11/05/21 0425 11/06/21 0319  NA 136 139 133* 136  K 4.6 4.5 4.6 4.1  CL 99  --  96* 97*  CO2 28  --  26 31  GLUCOSE 164*  --  210* 172*  BUN 63*  --  54* 44*  CREATININE 1.25*  --  1.05* 0.99  CALCIUM 10.1  --  9.1 10.3  MG  --   --   --  1.7  PROT 6.2*  --  5.5*  --   ALBUMIN 2.4*  --  2.2*  --   AST 42*  --  44*  --   ALT 25  --  27  --   ALKPHOS 70  --  61  --   BILITOT 0.7  --  1.1  --   GFRNONAA 47*  --  58* >60  ANIONGAP 9  --  11 8    Lipids No results for input(s): CHOL, TRIG, HDL, LABVLDL, LDLCALC, CHOLHDL in the last 168 hours.  Hematology Recent Labs  Lab 11/04/21 1959 11/04/21 2006 11/05/21 0425 11/06/21 0319  WBC 7.5  --  7.6 7.1  RBC 4.00  --  3.36* 3.59*  HGB 12.6 11.6* 10.7* 11.3*  HCT 38.1 34.0* 32.3* 33.3*  MCV 95.3  --  96.1 92.8  MCH 31.5  --  31.8 31.5  MCHC 33.1  --  33.1 33.9  RDW 15.5  --  15.1 14.8  PLT 290  --  272 264   Thyroid  Recent Labs  Lab 11/05/21 0425  TSH 0.621    BNP Recent Labs  Lab 11/04/21 1959  BNP 247.7*    DDimer No results for input(s): DDIMER in the last 168 hours.   Radiology    ECHOCARDIOGRAM LIMITED  Result Date: 11/09/2021    ECHOCARDIOGRAM LIMITED REPORT   Patient Name:   Claudia Keller Date of Exam: 11/09/2021 Medical Rec #:  [id number]      Height:       63.4 in Accession #:    [id number]     Weight:       145.5 lb Date of  Birth:  1951/10/20      BSA:          1.697 m Patient Age:    71 years       BP:           116/56 mmHg Patient Gender: F              HR:           72 bpm. Exam Location:  Inpatient Procedure: Limited Echo and Cardiac Doppler Indications:    Pericardial Effusion I31.3  History:        Patient has prior history of Echocardiogram examinations, most                 recent 11/06/2021. COPD, Arrythmias:Atrial Fibrillation; Risk                 Factors:Hypertension, Diabetes and Dyslipidemia. AMS (altered                 mental status), Pericardial Disease, chronic kidney disease.  Sonographer:    Alvino Chapel RCS Referring Phys: [place]  1. Left ventricular ejection fraction, by estimation, is 70 to 75%. The left ventricle has hyperdynamic function. The left ventricle has no regional wall motion abnormalities.  2. A small pericardial effusion is present. The pericardial effusion is circumferential.  3. Limited study to evaluate pericardial effusion FINDINGS  Left Ventricle: Left ventricular ejection fraction, by estimation, is 70 to 75%. The left ventricle has hyperdynamic function. The left ventricle has no regional wall motion abnormalities. Pericardium: A small pericardial effusion is present. The pericardial effusion is circumferential. Carlyle Dolly MD Electronically signed by Carlyle Dolly MD Signature Date/Time: 11/09/2021/6:25:11 PM    Final     Cardiac Studies   Limited Echo: 11/09/2021  IMPRESSIONS     1. Left ventricular ejection fraction, by estimation, is 70 to 75%. The  left  ventricle has hyperdynamic function. The left ventricle has no  regional wall motion abnormalities.   2. A small pericardial effusion is present. The pericardial effusion is  circumferential.   3. Limited study to evaluate pericardial effusion   FINDINGS   Left Ventricle: Left ventricular ejection fraction, by estimation, is 70  to 75%. The left ventricle has hyperdynamic function. The left  ventricle  has no regional wall motion abnormalities.   Pericardium: A small pericardial effusion is present. The pericardial  effusion is circumferential.   Carlyle Dolly MD   Patient Profile     71 y.o. female with a hx of PAF on Eliquis, hypothyroid, COPD, DM2, HTN, IBS, HLD, OA, who was seen 11/06/2021 for the evaluation of pericardial effusion.   Assessment & Plan    Large pericardial effusion: s/p pericardiocentesis 12/30 with 420cc bloody fluid drained. Drain removed on 11/08/2021.  Limited echo yesterday with small effusion present which is circumferential. -- currently on colchicine 0.6mg  BID planned for two months, initial thought for possible NSAIDs, but given the need to resume [place] will hold on starting for now. Will review Eliquis resumption with MD -- will need repeat limited echo in one week  Paroxsymal Afib: maintaining SR on Diltiazem 120mg  daily -- as above [place] held currently  HTN: stable on Diltiazem 120mg  daily  COPD: no wheezing on exam  Metabolic encephalopathy: in the setting of UTI. She is alert and oriented this morning -- management per primary  For questions or updates, please contact [place] Please consult [email or web address] for contact info under   Signed, Reino Bellis, NP  11/10/2021, 8:46 AM    I have personally seen and examined this patient. I agree with the assessment and plan as outlined above.  Stable this am. Small effusion on echo yesterday. Given hemorrhagic pericardial effusion, would not resume Eliquis for another 5 days. Continue colchicine. Sinus this am on Diltiazem.   Lauree Chandler 11/10/2021 11:08 AM

## 2021-11-10 NOTE — Progress Notes (Signed)
Called report to [facility] and spoke to [employer]. No questions at this time.

## 2021-11-11 ENCOUNTER — Encounter: Payer: Self-pay | Admitting: Adult Health

## 2021-11-11 ENCOUNTER — Non-Acute Institutional Stay (SKILLED_NURSING_FACILITY): Payer: Medicare Other | Admitting: Adult Health

## 2021-11-11 DIAGNOSIS — I3139 Other pericardial effusion (noninflammatory): Secondary | ICD-10-CM | POA: Diagnosis not present

## 2021-11-11 DIAGNOSIS — I5032 Chronic diastolic (congestive) heart failure: Secondary | ICD-10-CM

## 2021-11-11 DIAGNOSIS — F4310 Post-traumatic stress disorder, unspecified: Secondary | ICD-10-CM

## 2021-11-11 DIAGNOSIS — N1831 Chronic kidney disease, stage 3a: Secondary | ICD-10-CM

## 2021-11-11 DIAGNOSIS — Z8673 Personal history of transient ischemic attack (TIA), and cerebral infarction without residual deficits: Secondary | ICD-10-CM

## 2021-11-11 DIAGNOSIS — Z794 Long term (current) use of insulin: Secondary | ICD-10-CM

## 2021-11-11 DIAGNOSIS — E1122 Type 2 diabetes mellitus with diabetic chronic kidney disease: Secondary | ICD-10-CM | POA: Diagnosis not present

## 2021-11-11 DIAGNOSIS — N3000 Acute cystitis without hematuria: Secondary | ICD-10-CM

## 2021-11-11 DIAGNOSIS — I480 Paroxysmal atrial fibrillation: Secondary | ICD-10-CM | POA: Diagnosis not present

## 2021-11-11 DIAGNOSIS — F0153 Vascular dementia, unspecified severity, with mood disturbance: Secondary | ICD-10-CM

## 2021-11-11 NOTE — Progress Notes (Signed)
Location:  [place] Room Number: [place] of Service:  SNF (31) Provider:  Durenda Age, DNP, FNP-BC  Patient Care Team: Hendricks Limes, MD as PCP - General (Internal Medicine) Rosita Fire, MD (Internal Medicine) Satira Sark, MD (Cardiology) [name], MD (Gastroenterology) [name], NP as Nurse Practitioner (Internal Medicine)  Extended Emergency Contact Information Primary Emergency Contact: [facility] Address: [address]          [place], [place] [address] [name] of [place] Phone: [phone] Relation: Son Secondary Emergency Contact: [place] Mobile Phone: [phone] Relation: Other  Code Status:  FULL CODE  Goals of care: Advanced Directive information Advanced Directives 11/11/2021  Does Patient Have a Medical Advance Directive? No  Type of Advance Directive -  [place] in Chart? -  Would patient like information on creating a medical advance directive? -  Pre-existing out of facility DNR order (yellow form or pink MOST form) -  Some encounter information is confidential and restricted. Go to Review Flowsheets activity to see all data.     Chief Complaint  Patient presents with   Hospitalization Follow-up    Follow up hospitalization    HPI:  Pt is a 71 y.o. female who was admitted to [facility] on 11/10/21 post hospitalization 11/04/21 to 11/10/21.  She has a PMH of hypertension, COPD, type 2 diabetes mellitus, chronic diastolic heart failure, hypothyroidism and recent stroke with left hemiparesis.  Of note, she was having short-term rehabilitation at [facility] post hospitalization 09/02/21 to 09/04/21. She was brought to the hospital with sodium 146 and hypoxemic at 87% on room air.  Family reported not acting quite right since last few days.  She was confused.  In the ED, CT scan of the chest abdomen pelvis was done which showed no  evidence of pulmonary embolism. She was found to have large pericardial effusion on CT scan and echocardiogram.  Pericardiocentesis was performed on 12/30, removed 450 mL of bloody fluids.  Pericardial drain was capped and removed on 11/08/2021.  Urinalysis was abnormal and was treated with Rocephin for UTI.                                                                                                                                                                                                                             She was seen in her room today. She was wanting  to get up and was asking to be brought to the toilet. She has left hemiplegia and needing assistance from staff. She was unable to answer queries due to dementia.   Past Medical History:  Diagnosis Date   Arthritis    COPD (chronic obstructive pulmonary disease) ([place])    Diverticulosis    Essential hypertension, benign    Hx of colonic polyps    Hypothyroidism    IBS (irritable bowel syndrome)    Irritable bowel syndrome    Ketoacidosis    Mixed hyperlipidemia    PTSD (post-traumatic stress disorder)    S/P colonoscopy Jan 2011   RMR: pancolonic diverticula, polypoid rectal mucosa with  prominent lymphoid aggregates, repeat in Jan 2016    Shortness of breath dyspnea    Stress incontinence, female    Thyroid disease    Tobacco use    Type 2 diabetes mellitus ([place])    Past Surgical History:  Procedure Laterality Date   Bilateral tubal ligation     BREAST BIOPSY Left 10/16/2014   negative   CHOLECYSTECTOMY  09/11/2012   Procedure: LAPAROSCOPIC CHOLECYSTECTOMY;  Surgeon: Jamesetta So, MD;  Location: [facility] ORS;  Service: General;  Laterality: N/A;   COLONOSCOPY  11/17/2009   Dr. Gala Romney: normal rectum, pancolonic diverticula, polyps benign, no adenomas.    COLONOSCOPY N/A 02/12/2015   Dr. Gala Romney: colonic diverticulosis, multiple colonic polyps (tubular adenomas), negative segmental biopsies. Surveillance in 2021    ESOPHAGOGASTRODUODENOSCOPY  11/23/2011   Dr. Gala Romney: Erosive reflux esophagitis; small hiatal hernia; esophagus dilated empirically   MALONEY DILATION  11/23/2011   Procedure: Venia Minks DILATION;  Surgeon: Daneil Dolin, MD;  Location: [facility] ENDO SUITE;  Service: Endoscopy;  Laterality: N/A;   PERICARDIOCENTESIS N/A 11/06/2021   Procedure: PERICARDIOCENTESIS;  Surgeon: Jettie Booze, MD;  Location: [facility];  Service: Cardiovascular;  Laterality: N/A;   SKIN LESION EXCISION     on buttocks   TUBAL LIGATION      No Known Allergies  Outpatient Encounter Medications as of 11/11/2021  Medication Sig   acetaminophen (TYLENOL) 325 MG tablet Take 650 mg by mouth every 5 (five) hours as needed for mild pain.   albuterol (VENTOLIN HFA) 108 (90 Base) MCG/ACT inhaler Inhale 2 puffs into the lungs every 4 (four) hours as needed for wheezing or shortness of breath.   aspirin 81 MG chewable tablet Chew 81 mg by mouth daily.   atorvastatin (LIPITOR) 80 MG tablet Take 1 tablet (80 mg total) by mouth at bedtime.   bisacodyl (DULCOLAX) 10 MG suppository Place 10 mg rectally daily as needed for moderate constipation or severe constipation. Use only if Milk of Magnesia does not work as needed every 24 hours.   Calcium Carb-Cholecalciferol (OYSTER SHELL CALCIUM W/D) 500-200 MG-UNIT TABS Take 1 tablet by mouth 2 (two) times daily.   cetirizine (ZYRTEC) 10 MG tablet Take 10 mg by mouth daily.   colestipol (COLESTID) 1 g tablet TAKE 2 TABLETS DAILY FOR DIARRHEA. DO NOT TAKE WITHIN 2 HOURS OF OTHER ORAL MEDICATIONS. HOLD FOR CONSTIPATION.   cyanocobalamin ([employer] VITAMIN B12) 2000 MCG tablet Take 1 tablet (2,000 mcg total) by mouth daily.   diltiazem (CARDIZEM CD) 120 MG 24 hr capsule Take 1 capsule (120 mg total) by mouth daily.   divalproex (DEPAKOTE ER) 500 MG 24 hr tablet Take 2 tablets (1,000 mg total) by mouth every evening.   divalproex (DEPAKOTE) 250 MG DR tablet Take 1 tablet (250 mg total) by mouth  daily.   furosemide (LASIX) 20 MG tablet Take 1 tablet (20 mg total) by mouth every other day.   insulin detemir (LEVEMIR) 100 UNIT/ML injection Inject 0.12 mLs (12 Units total) into the skin 2 (two) times daily.   levothyroxine (SYNTHROID) 200 MCG tablet Take 200 mcg by mouth daily before breakfast.   magnesium hydroxide (MILK OF MAGNESIA) 400 MG/5ML suspension Take 30 mLs by mouth daily as needed for mild constipation or moderate constipation. If no BM in 3 days, take 30cc every 24 hours PRN   mirtazapine (REMERON) 15 MG tablet Take 15 mg by mouth at bedtime.   nicotine polacrilex (NICORETTE) 4 MG gum Take 4 mg by mouth every 2 (two) hours as needed for smoking cessation. **avoid food/drink for 15 minutes before and after**   Nutritional Supplements (NUTRITIONAL SUPPLEMENT PO) Take by mouth. Magic Cup by mouth once Daily   ondansetron (ZOFRAN) 4 MG tablet Take 1 tablet (4 mg total) by mouth every 8 (eight) hours as needed for nausea or vomiting.   pantoprazole (PROTONIX) 40 MG tablet TAKE 1 TABLET BY MOUTH ONCE DAILY 30 MINTUES BEFORE BREAKFAST.   PARoxetine (PAXIL) 40 MG tablet Take 1 tablet (40 mg total) by mouth daily.   QUEtiapine (SEROQUEL) 100 MG tablet Take 1 tablet (100 mg total) by mouth at bedtime.   senna-docusate (SENOKOT-S) 8.6-50 MG tablet Take 1 tablet by mouth at bedtime as needed for mild constipation.   Sodium Phosphates (RA SALINE ENEMA RE) Place 1 Dose rectally once as needed (severe constipation). If no relief from both milk of magnesia, and Bisacodyl suppository, administer one dose of disposable saline enema.   [START ON 11/15/2021] apixaban (ELIQUIS) 5 MG TABS tablet Take 5 mg by mouth 2 (two) times daily.   colchicine 0.6 MG tablet Take 1 tablet (0.6 mg total) by mouth daily.   [DISCONTINUED] Continuous Blood Gluc Sensor (FREESTYLE LIBRE 2 SENSOR) MISC 1 Device by Does not apply route every 14 (fourteen) days.   [DISCONTINUED] glucose blood (QUINTET BLOOD GLUCOSE TEST) test  strip Check blood sugar 2 times daily E11.65   No facility-administered encounter medications on file as of 11/11/2021.    Review of Systems  Unable to obtain due to dementia.    Immunization History  Administered Date(s) Administered   Moderna Sars-Covid-2 Vaccination 11/16/2019, 12/10/2019   Pertinent  Health Maintenance Due  Topic Date Due   OPHTHALMOLOGY EXAM  Never done   URINE MICROALBUMIN  Never done   INFLUENZA VACCINE  06/08/2021   HEMOGLOBIN A1C  05/06/2022   FOOT EXAM  08/04/2022   MAMMOGRAM  10/15/2023   COLONOSCOPY (Pts 45-41yrs Insurance coverage will need to be confirmed)  02/11/2025   DEXA SCAN  Completed   Fall Risk 11/07/2021 11/08/2021 11/08/2021 11/09/2021 11/10/2021  Falls in the past year? - - - - -  Number of falls in past year - - - - -  Was there an injury with Fall? - - - - -  Fall Risk Category Calculator - - - - -  Fall Risk Category - - - - -  Patient Fall Risk Level High fall risk High fall risk High fall risk High fall risk High fall risk  Patient at Risk for Falls Due to - - - - -  Fall risk Follow up - - - - -     Vitals:   11/11/21 1028  BP: 102/60  Pulse: 68  Resp: 18  Temp: 97.7 F (36.5 C)  Weight: 139 lb  12.8 oz (63.4 kg)  Height: 5\' 3"  (1.6 m)   Body mass index is 24.76 kg/m.  Physical Exam Constitutional:      Appearance: She is normal weight.  HENT:     Head: Normocephalic and atraumatic.     Nose: Nose normal. No congestion.     Mouth/Throat:     Mouth: Mucous membranes are moist.  Eyes:     Conjunctiva/sclera: Conjunctivae normal.  Cardiovascular:     Rate and Rhythm: Rhythm irregular.  Pulmonary:     Effort: Pulmonary effort is normal.     Breath sounds: Normal breath sounds.  Abdominal:     General: Bowel sounds are normal.     Palpations: Abdomen is soft.  Musculoskeletal:     Right lower leg: No edema.     Left lower leg: No edema.  Skin:    General: Skin is warm and dry.  Neurological:     Mental Status:  She is alert.     Comments: Left hemiplegia. Alert to self, disoriented to time and place.  Psychiatric:        Mood and Affect: Mood normal.       Labs reviewed: Recent Labs    12/11/20 0643 12/12/20 0416 09/02/21 2018 11/04/21 1959 11/04/21 2006 11/05/21 0425 11/06/21 0319  NA 141 140   < > 136 139 133* 136  K 3.6 3.8   < > 4.6 4.5 4.6 4.1  CL 102 102   < > 99  --  96* 97*  CO2 31 29   < > 28  --  26 31  GLUCOSE 74 119*   < > 164*  --  210* 172*  BUN 30* 34*   < > 63*  --  54* 44*  CREATININE 0.74 0.75   < > 1.25*  --  1.05* 0.99  CALCIUM 9.5 9.6   < > 10.1  --  9.1 10.3  MG 1.7 1.5*  --   --   --   --  1.7  PHOS 2.9 2.9  --   --   --   --  2.6   < > = values in this interval not displayed.   Recent Labs    09/03/21 0357 11/04/21 1959 11/05/21 0425  AST 15 42* 44*  ALT 16 25 27   ALKPHOS 52 70 61  BILITOT 0.7 0.7 1.1  PROT 6.4* 6.2* 5.5*  ALBUMIN 3.6 2.4* 2.2*   Recent Labs    09/02/21 2018 09/02/21 2024 11/04/21 1959 11/04/21 2006 11/05/21 0425 11/06/21 0319  WBC 9.4   < > 7.5  --  7.6 7.1  NEUTROABS 5.9  --  5.0  --   --  3.6  HGB 13.8   < > 12.6 11.6* 10.7* 11.3*  HCT 39.9   < > 38.1 34.0* 32.3* 33.3*  MCV 91.5   < > 95.3  --  96.1 92.8  PLT 201   < > 290  --  272 264   < > = values in this interval not displayed.   Lab Results  Component Value Date   TSH 0.621 11/05/2021   Lab Results  Component Value Date   HGBA1C 7.8 (H) 11/05/2021   Lab Results  Component Value Date   CHOL 275 (H) 09/03/2021   HDL 39 (L) 09/03/2021   LDLCALC 202 (H) 09/03/2021   TRIG 169 (H) 09/03/2021   CHOLHDL 7.1 09/03/2021    Significant Diagnostic Results in last 30 days:  DG  Chest 1 View  Result Date: 11/04/2021 CLINICAL DATA:  Altered mental status. EXAM: CHEST  1 VIEW COMPARISON:  September 02, 2021 FINDINGS: Mild to moderate severity infiltrate is seen within the left lung base. There is no evidence of a pleural effusion or pneumothorax. There is moderate  to marked severity enlargement of the cardiac silhouette. This is increased in size when compared to the prior study, which may be technical in nature. Moderate to marked severity calcification of the aortic arch is noted. The visualized skeletal structures are unremarkable. IMPRESSION: Moderate to marked severity cardiomegaly with mild to moderate severity left basilar infiltrate. Electronically Signed   By: Virgina Norfolk M.D.   On: 11/04/2021 20:12   CT HEAD WO CONTRAST (5MM)  Result Date: 11/04/2021 CLINICAL DATA:  Altered level of consciousness for 2 days EXAM: CT HEAD WITHOUT CONTRAST TECHNIQUE: Contiguous axial images were obtained from the base of the skull through the vertex without intravenous contrast. COMPARISON:  09/03/2021, 09/02/2021 FINDINGS: Brain: Chronic ischemic changes are seen within the bilateral basal ganglia, right greater than left. No evidence of acute infarct or hemorrhage. Lateral ventricles and remaining midline structures are unremarkable. No acute extra-axial fluid collections. No mass effect. Vascular: No hyperdense vessel or unexpected calcification. Skull: Normal. Negative for fracture or focal lesion. Sinuses/Orbits: No acute finding. Other: None. IMPRESSION: 1. Chronic small vessel ischemic changes within the basal ganglia. No acute intracranial process. Electronically Signed   By: Randa Ngo M.D.   On: 11/04/2021 19:47   CT Angio Chest PE W and/or Wo Contrast  Result Date: 11/04/2021 CLINICAL DATA:  Hypoxia, abdominal pain EXAM: CT ANGIOGRAPHY CHEST CT ABDOMEN AND PELVIS WITH CONTRAST TECHNIQUE: Multidetector CT imaging of the chest was performed using the standard protocol during bolus administration of intravenous contrast. Multiplanar CT image reconstructions and MIPs were obtained to evaluate the vascular anatomy. Multidetector CT imaging of the abdomen and pelvis was performed using the standard protocol during bolus administration of intravenous contrast.  CONTRAST:  132mL OMNIPAQUE IOHEXOL 350 MG/ML SOLN COMPARISON:  11/04/2021, 03/18/2020 FINDINGS: CTA CHEST FINDINGS Cardiovascular: This is a technically adequate evaluation of the pulmonary vasculature. No filling defects or pulmonary emboli. There is a large pericardial effusion measuring up to 2.8 cm in thickness. There is subtle pericardial enhancement, concerning for exudative effusion. No evidence of thoracic aortic aneurysm or dissection. Moderate atherosclerosis of the aorta and coronary vasculature. Mediastinum/Nodes: No enlarged mediastinal, hilar, or axillary lymph nodes. Thyroid gland, trachea, and esophagus demonstrate no significant findings. Lungs/Pleura: Small left pleural effusion with left lower lobe compressive atelectasis. Mild bilateral perihilar interstitial and ground-glass opacities consistent with mild edema. No pneumothorax. Central airways are patent. Musculoskeletal: No acute or destructive bony lesions. Reconstructed images demonstrate no additional findings. Review of the MIP images confirms the above findings. CT ABDOMEN and PELVIS FINDINGS Hepatobiliary: Reflux of contrast into the hepatic veins suggests cardiac dysfunction. No focal liver abnormality. Gallbladder is surgically absent. Pancreas: Unremarkable. No pancreatic ductal dilatation or surrounding inflammatory changes. Spleen: Normal in size without focal abnormality. Adrenals/Urinary Tract: Adrenal glands are unremarkable. Kidneys are normal, without renal calculi, focal lesion, or hydronephrosis. Bladder is unremarkable. Stomach/Bowel: No bowel obstruction or ileus. Normal appendix right lower quadrant. Mild wall thickening involving the distal transverse and descending colon is nonspecific given decompressed state, and underlying colitis cannot be excluded. No associated inflammatory change. Vascular/Lymphatic: Significant atherosclerosis of the aorta, with focal high-grade stenosis of the distal aorta just proximal to the  bifurcation. Please correlate with any symptoms  of claudication. No pathologic adenopathy within the abdomen or pelvis. Reproductive: Uterus and bilateral adnexa are unremarkable. Other: No free fluid or free gas.  No abdominal wall hernia. Musculoskeletal: No acute or destructive bony lesions. Prominent spondylosis at L5-S1. Reconstructed images demonstrate no additional findings. Review of the MIP images confirms the above findings. IMPRESSION: 1. Large pericardial effusion, with pericardial enhancement concerning for pericarditis and exudative effusion. 2. Mild perihilar edema, small left pleural effusion, and reflux of contrast into the hepatic veins may reflect an element of constrictive pericarditis. Correlation with echocardiography is recommended. 3. No evidence of pulmonary embolus. 4. Nonspecific segmental distal colonic wall thickening. While this could be due to decompressed state, mild colitis cannot be excluded. 5. Aortic Atherosclerosis (ICD10-I70.0). High-grade stenosis at the aortic bifurcation from heavily calcified atheromatous plaque unchanged. Please correlate with any symptoms of lower extremity claudication. Electronically Signed   By: Randa Ngo M.D.   On: 11/04/2021 23:15   MR BRAIN WO CONTRAST  Result Date: 11/05/2021 CLINICAL DATA:  Altered mental status, recent history of multiple territory strokes EXAM: MRI HEAD WITHOUT CONTRAST TECHNIQUE: Multiplanar, multiecho pulse sequences of the brain and surrounding structures were obtained without intravenous contrast. COMPARISON:  CT head dated 1 day prior, MR head 09/03/2021 FINDINGS: Brain: There is faintly elevated DWI signal in the posterior limb of the right internal capsule and lentiform nucleus without convincing low ADC signal. There is associated FLAIR signal abnormality. This signal abnormality was present on the brain MRI from 09/03/2021 and likely reflects late subacute infarct. Curvilinear SWI signal dropout in this region  likely reflects petechial blood products. There is an additional faint focus of diffusion restriction in the posterior limb of the left internal capsule with mildly low ADC signal and FLAIR signal abnormality which was not present on the prior brain MRI, likely reflecting more recent early subacute stroke. There is no evidence of associated hemorrhage. There is no other evidence of acute infarct. There is no evidence of acute intracranial hemorrhage or extra-axial fluid collection. There is unchanged mild global parenchymal volume loss with prominence of the ventricular system and extra-axial CSF spaces. Additional foci of FLAIR signal abnormality in the subcortical and periventricular white matter likely reflect sequela of chronic white matter microangiopathy. A few additional remote lacunar infarcts are unchanged. There is no solid mass lesion.  There is no midline shift. Vascular: Normal flow voids. Skull and upper cervical spine: Normal marrow signal. Sinuses/Orbits: The paranasal sinuses are clear. The globes and orbits are unremarkable. Other: There are bilateral mastoid effusions, unchanged. IMPRESSION: 1. Small infarct in the left internal capsule is new since the prior brain MRI of 09/03/2021, likely subacute in chronicity. 2. Evolving late subacute to early chronic infarcts in the right internal capsule/basal ganglia. 3. No other acute infarct or other acute intracranial pathology. Electronically Signed   By: Valetta Mole M.D.   On: 11/05/2021 12:27   CT ABDOMEN PELVIS W CONTRAST  Result Date: 11/04/2021 CLINICAL DATA:  Hypoxia, abdominal pain EXAM: CT ANGIOGRAPHY CHEST CT ABDOMEN AND PELVIS WITH CONTRAST TECHNIQUE: Multidetector CT imaging of the chest was performed using the standard protocol during bolus administration of intravenous contrast. Multiplanar CT image reconstructions and MIPs were obtained to evaluate the vascular anatomy. Multidetector CT imaging of the abdomen and pelvis was  performed using the standard protocol during bolus administration of intravenous contrast. CONTRAST:  147mL OMNIPAQUE IOHEXOL 350 MG/ML SOLN COMPARISON:  11/04/2021, 03/18/2020 FINDINGS: CTA CHEST FINDINGS Cardiovascular: This is a technically adequate evaluation  of the pulmonary vasculature. No filling defects or pulmonary emboli. There is a large pericardial effusion measuring up to 2.8 cm in thickness. There is subtle pericardial enhancement, concerning for exudative effusion. No evidence of thoracic aortic aneurysm or dissection. Moderate atherosclerosis of the aorta and coronary vasculature. Mediastinum/Nodes: No enlarged mediastinal, hilar, or axillary lymph nodes. Thyroid gland, trachea, and esophagus demonstrate no significant findings. Lungs/Pleura: Small left pleural effusion with left lower lobe compressive atelectasis. Mild bilateral perihilar interstitial and ground-glass opacities consistent with mild edema. No pneumothorax. Central airways are patent. Musculoskeletal: No acute or destructive bony lesions. Reconstructed images demonstrate no additional findings. Review of the MIP images confirms the above findings. CT ABDOMEN and PELVIS FINDINGS Hepatobiliary: Reflux of contrast into the hepatic veins suggests cardiac dysfunction. No focal liver abnormality. Gallbladder is surgically absent. Pancreas: Unremarkable. No pancreatic ductal dilatation or surrounding inflammatory changes. Spleen: Normal in size without focal abnormality. Adrenals/Urinary Tract: Adrenal glands are unremarkable. Kidneys are normal, without renal calculi, focal lesion, or hydronephrosis. Bladder is unremarkable. Stomach/Bowel: No bowel obstruction or ileus. Normal appendix right lower quadrant. Mild wall thickening involving the distal transverse and descending colon is nonspecific given decompressed state, and underlying colitis cannot be excluded. No associated inflammatory change. Vascular/Lymphatic: Significant  atherosclerosis of the aorta, with focal high-grade stenosis of the distal aorta just proximal to the bifurcation. Please correlate with any symptoms of claudication. No pathologic adenopathy within the abdomen or pelvis. Reproductive: Uterus and bilateral adnexa are unremarkable. Other: No free fluid or free gas.  No abdominal wall hernia. Musculoskeletal: No acute or destructive bony lesions. Prominent spondylosis at L5-S1. Reconstructed images demonstrate no additional findings. Review of the MIP images confirms the above findings. IMPRESSION: 1. Large pericardial effusion, with pericardial enhancement concerning for pericarditis and exudative effusion. 2. Mild perihilar edema, small left pleural effusion, and reflux of contrast into the hepatic veins may reflect an element of constrictive pericarditis. Correlation with echocardiography is recommended. 3. No evidence of pulmonary embolus. 4. Nonspecific segmental distal colonic wall thickening. While this could be due to decompressed state, mild colitis cannot be excluded. 5. Aortic Atherosclerosis (ICD10-I70.0). High-grade stenosis at the aortic bifurcation from heavily calcified atheromatous plaque unchanged. Please correlate with any symptoms of lower extremity claudication. Electronically Signed   By: Randa Ngo M.D.   On: 11/04/2021 23:15   CARDIAC CATHETERIZATION  Result Date: 11/10/2021   Successful pericardiocentesis removing 420 cc of bloody fluid. Await cytology results and other laboratory analysis of the pericardial fluid. Consider removing pericardial drain tomorrow depending on the output. Results conveyed to son, Charlotte Crumb.   MM 3D SCREEN BREAST BILATERAL  Result Date: 10/15/2021 CLINICAL DATA:  Screening. EXAM: DIGITAL SCREENING BILATERAL MAMMOGRAM WITH TOMOSYNTHESIS AND CAD TECHNIQUE: Bilateral screening digital craniocaudal and mediolateral oblique mammograms were obtained. Bilateral screening digital breast tomosynthesis was performed.  The images were evaluated with computer-aided detection. COMPARISON:  Previous exam(s). ACR Breast Density Category b: There are scattered areas of fibroglandular density. FINDINGS: There are no findings suspicious for malignancy. IMPRESSION: No mammographic evidence of malignancy. A result letter of this screening mammogram will be mailed directly to the patient. RECOMMENDATION: Screening mammogram in one year. (Code:SM-B-01Y) BI-RADS CATEGORY  1: Negative. Electronically Signed   By: Marin Olp M.D.   On: 10/15/2021 08:52   ECHOCARDIOGRAM COMPLETE  Result Date: 11/05/2021    ECHOCARDIOGRAM REPORT   Patient Name:   GREENLEY MARTONE Date of Exam: 11/05/2021 Medical Rec #:  [id number]      Height:  63.5 in Accession #:    [id number]     Weight:       135.0 lb Date of Birth:  1951-07-09      BSA:          1.646 m Patient Age:    11 years       BP:           160/88 mmHg Patient Gender: F              HR:           94 bpm. Exam Location:  Inpatient Procedure: 2D Echo, Cardiac Doppler and Color Doppler Indications:    Stroke  History:        Patient has prior history of Echocardiogram examinations, most                 recent 09/03/2021. Stroke; Risk Factors:Former Smoker,                 Hypertension and Diabetes.  Sonographer:    Merrie Roof RDCS Referring Phys: [id number] [place]  1. Left ventricular ejection fraction, by estimation, is 60 to 65%. The left ventricle has normal function. The left ventricle has no regional wall motion abnormalities. Left ventricular diastolic parameters are consistent with Grade I diastolic dysfunction (impaired relaxation).  2. Right ventricular systolic function is normal. The right ventricular size is normal. There is normal pulmonary artery systolic pressure.  3. Large circumferential pericardial effusion. Increased in since since 08/2021. Large pericardial effusion. The pericardial effusion is circumferential. There is no evidence of cardiac tamponade.   4. The mitral valve is normal in structure. No evidence of mitral valve regurgitation. No evidence of mitral stenosis.  5. The aortic valve is tricuspid. Aortic valve regurgitation is not visualized. No aortic stenosis is present.  6. The inferior vena cava is normal in size with greater than 50% respiratory variability, suggesting right atrial pressure of 3 mmHg. FINDINGS  Left Ventricle: Left ventricular ejection fraction, by estimation, is 60 to 65%. The left ventricle has normal function. The left ventricle has no regional wall motion abnormalities. The left ventricular internal cavity size was normal in size. There is  no left ventricular hypertrophy. Left ventricular diastolic parameters are consistent with Grade I diastolic dysfunction (impaired relaxation). Right Ventricle: The right ventricular size is normal. No increase in right ventricular wall thickness. Right ventricular systolic function is normal. There is normal pulmonary artery systolic pressure. The tricuspid regurgitant velocity is 1.62 m/s, and  with an assumed right atrial pressure of 3 mmHg, the estimated right ventricular systolic pressure is 37.6 mmHg. Left Atrium: Left atrial size was normal in size. Right Atrium: Right atrial size was normal in size. Pericardium: Large circumferential pericardial effusion. Increased in since since 08/2021. A large pericardial effusion is present. The pericardial effusion is circumferential. There is diastolic collapse of the right ventricular free wall. There is no evidence of cardiac tamponade. Presence of epicardial fat layer. Mitral Valve: The mitral valve is normal in structure. No evidence of mitral valve regurgitation. No evidence of mitral valve stenosis. Tricuspid Valve: The tricuspid valve is normal in structure. Tricuspid valve regurgitation is trivial. No evidence of tricuspid stenosis. Aortic Valve: The aortic valve is tricuspid. Aortic valve regurgitation is not visualized. No aortic stenosis  is present. Aortic valve mean gradient measures 4.0 mmHg. Aortic valve peak gradient measures 6.9 mmHg. Aortic valve area, by VTI measures 2.67 cm. Pulmonic Valve: The pulmonic valve was normal  in structure. Pulmonic valve regurgitation is not visualized. No evidence of pulmonic stenosis. Aorta: The aortic root is normal in size and structure. Venous: The inferior vena cava is normal in size with greater than 50% respiratory variability, suggesting right atrial pressure of 3 mmHg. IAS/Shunts: No atrial level shunt detected by color flow Doppler.  LEFT VENTRICLE PLAX 2D LVIDd:         3.60 cm   Diastology LVIDs:         2.40 cm   LV e' medial:    6.76 cm/s LV PW:         0.90 cm   LV E/e' medial:  10.7 LV IVS:        1.10 cm   LV e' lateral:   7.42 cm/s LVOT diam:     1.80 cm   LV E/e' lateral: 9.8 LV SV:         51 LV SV Index:   31 LVOT Area:     2.54 cm  RIGHT VENTRICLE          IVC RV Basal diam:  3.00 cm  IVC diam: 1.50 cm LEFT ATRIUM             Index        RIGHT ATRIUM           Index LA diam:        3.20 cm 1.94 cm/m   RA Area:     16.50 cm LA Vol (A2C):   41.5 ml 25.22 ml/m  RA Volume:   46.40 ml  28.19 ml/m LA Vol (A4C):   43.9 ml 26.67 ml/m LA Biplane Vol: 44.1 ml 26.80 ml/m  AORTIC VALVE AV Area (Vmax):    2.39 cm AV Area (Vmean):   2.31 cm AV Area (VTI):     2.67 cm AV Vmax:           131.00 cm/s AV Vmean:          90.100 cm/s AV VTI:            0.190 m AV Peak Grad:      6.9 mmHg AV Mean Grad:      4.0 mmHg LVOT Vmax:         123.00 cm/s LVOT Vmean:        81.700 cm/s LVOT VTI:          0.199 m LVOT/AV VTI ratio: 1.05  AORTA Ao Root diam: 2.80 cm Ao Asc diam:  2.90 cm MITRAL VALVE                TRICUSPID VALVE MV Area (PHT): 4.57 cm     TR Peak grad:   10.5 mmHg MV Decel Time: 166 msec     TR Vmax:        162.00 cm/s MV E velocity: 72.60 cm/s MV A velocity: 117.00 cm/s  SHUNTS MV E/A ratio:  0.62         Systemic VTI:  0.20 m                             Systemic Diam: 1.80 cm Skeet Latch MD Electronically signed by Skeet Latch MD Signature Date/Time: 11/05/2021/4:34:25 PM    Final    ECHOCARDIOGRAM LIMITED  Result Date: 11/09/2021    ECHOCARDIOGRAM LIMITED REPORT   Patient Name:   ELLARY CASAMENTO Date of Exam: 11/09/2021 Medical Rec #:  [id number]  Height:       63.4 in Accession #:    [id number]     Weight:       145.5 lb Date of Birth:  Jan 10, 1951      BSA:          1.697 m Patient Age:    49 years       BP:           116/56 mmHg Patient Gender: F              HR:           72 bpm. Exam Location:  Inpatient Procedure: Limited Echo and Cardiac Doppler Indications:    Pericardial Effusion I31.3  History:        Patient has prior history of Echocardiogram examinations, most                 recent 11/06/2021. COPD, Arrythmias:Atrial Fibrillation; Risk                 Factors:Hypertension, Diabetes and Dyslipidemia. AMS (altered                 mental status), Pericardial Disease, chronic kidney disease.  Sonographer:    Alvino Chapel RCS Referring Phys: [place]  1. Left ventricular ejection fraction, by estimation, is 70 to 75%. The left ventricle has hyperdynamic function. The left ventricle has no regional wall motion abnormalities.  2. A small pericardial effusion is present. The pericardial effusion is circumferential.  3. Limited study to evaluate pericardial effusion FINDINGS  Left Ventricle: Left ventricular ejection fraction, by estimation, is 70 to 75%. The left ventricle has hyperdynamic function. The left ventricle has no regional wall motion abnormalities. Pericardium: A small pericardial effusion is present. The pericardial effusion is circumferential. Carlyle Dolly MD Electronically signed by Carlyle Dolly MD Signature Date/Time: 11/09/2021/6:25:11 PM    Final    ECHOCARDIOGRAM LIMITED  Result Date: 11/06/2021    ECHOCARDIOGRAM LIMITED REPORT   Patient Name:   CHANTAY WHITELOCK Date of Exam: 11/06/2021 Medical Rec #:  [id number]      Height:        63.5 in Accession #:    [id number]     Weight:       143.5 lb Date of Birth:  [birth year]      BSA:          1.689 m Patient Age:    31 years       BP:           159/68 mmHg Patient Gender: F              HR:           88 bpm. Exam Location:  Inpatient Procedure: Limited Echo Indications:    pericardial tap  History:        Patient has prior history of Echocardiogram examinations, most                 recent 11/05/2021. Pericardial Disease, chronic kidney disease                 and COPD, Signs/Symptoms:Altered Mental Status; Risk                 Factors:Diabetes and Hypertension.  Sonographer:    Johny Chess RDCS Referring Phys: [place]  1. Large circumferential pericardial effusion up to 2.0 cm posterior to the LV. No RA/RV diastolic collapse. No dopper interrogation.  Echo guided pericardiocentesis performed. Last image shows catheter posterior to LV, with improvement of effusion on final image to 0.5 cm. Large pericardial effusion. The pericardial effusion is circumferential.  2. Left ventricular ejection fraction, by estimation, is 60 to 65%. The left ventricle has normal function. The left ventricle has no regional wall motion abnormalities.  3. Right ventricular systolic function is normal. The right ventricular size is normal. FINDINGS  Left Ventricle: Left ventricular ejection fraction, by estimation, is 60 to 65%. The left ventricle has normal function. The left ventricle has no regional wall motion abnormalities. Right Ventricle: The right ventricular size is normal. No increase in right ventricular wall thickness. Right ventricular systolic function is normal. Left Atrium: Left atrial size was normal in size. Right Atrium: Right atrial size was normal in size. Pericardium: Large circumferential pericardial effusion up to 2.0 cm posterior to the LV. No RA/RV diastolic collapse. No dopper interrogation. Echo guided pericardiocentesis performed. Last image shows catheter  posterior to LV, with improvement of effusion on final image to 0.5 cm. A large pericardial effusion is present. The pericardial effusion is circumferential. Presence of epicardial fat layer. Eleonore Chiquito MD Electronically signed by Eleonore Chiquito MD Signature Date/Time: 11/06/2021/5:52:14 PM    Final     Assessment/Plan  1. Pericardial effusion -  S/P pericardiotomy, obtained 420 mL bloody fluid -   Repeat echocardiogram showed minimal pericardial effusion -   Continue colchicine 0.6 mg daily  -   Will follow-up.  Cardiology in 1 week for repeat echocardiogram -    Eliquis will be held for additional 5 days due to hemorrhagic pericardial effusion.  2. Acute cystitis without hematuria -   Completed Rocephin treatment  3. PAF (paroxysmal atrial fibrillation) (HCC) -   Continue diltiazem 24-hour ER 120 mg 1 capsule daily -   Eliquis on hold x5 days  4.  Type 2 diabetes mellitus with chronic kidney disease stage IIIa Lab Results  Component Value Date   HGBA1C 7.8 (H) 11/05/2021   -   Continue Levemir 12 units twice daily -    Monitor CBGs  5. Chronic diastolic heart failure (HCC) -   Stable, continue furosemide 20 mg every other day  6. History of CVA (cerebrovascular accident) -Has left hemiplegia -   Continue atorvastatin 50 mg 1 tab at bedtime  7. PTSD (post-traumatic stress disorder) -   Mood is stable, continue divalproex sodium DR 250 mg 1 tab daily and 500 mg in the evening, quetiapine 100 mg 1 tab at bedtime  8. Vascular dementia with mood disturbance, unspecified dementia severity -  BIMS score 7/15, ranging in severe cognitive impairment -   Continue supportive care   Family/ staff Communication: Discussed plan of care with charge nurse  Labs/tests ordered:   BMP and CBC    Durenda Age, DNP, MSN, FNP-BC [facility] [phone] (Monday-Friday 8:00 a.m. - 5:00 p.m.) [phone] (after hours)

## 2021-11-12 ENCOUNTER — Encounter: Payer: Self-pay | Admitting: Internal Medicine

## 2021-11-12 ENCOUNTER — Non-Acute Institutional Stay (SKILLED_NURSING_FACILITY): Payer: Medicare Other | Admitting: Internal Medicine

## 2021-11-12 DIAGNOSIS — N179 Acute kidney failure, unspecified: Secondary | ICD-10-CM | POA: Diagnosis not present

## 2021-11-12 DIAGNOSIS — I319 Disease of pericardium, unspecified: Secondary | ICD-10-CM

## 2021-11-12 DIAGNOSIS — I5033 Acute on chronic diastolic (congestive) heart failure: Secondary | ICD-10-CM | POA: Diagnosis not present

## 2021-11-12 DIAGNOSIS — R410 Disorientation, unspecified: Secondary | ICD-10-CM

## 2021-11-12 DIAGNOSIS — E46 Unspecified protein-calorie malnutrition: Secondary | ICD-10-CM | POA: Insufficient documentation

## 2021-11-12 DIAGNOSIS — E538 Deficiency of other specified B group vitamins: Secondary | ICD-10-CM

## 2021-11-12 DIAGNOSIS — E440 Moderate protein-calorie malnutrition: Secondary | ICD-10-CM

## 2021-11-12 DIAGNOSIS — N1831 Chronic kidney disease, stage 3a: Secondary | ICD-10-CM

## 2021-11-12 LAB — CYTOLOGY - NON PAP

## 2021-11-12 NOTE — Assessment & Plan Note (Addendum)
11/12/2021 clinically the patient has significant vascular dementia. She gave the date as "October 12, 12".  She confabulated throughout the exam.  B12 level was 162 on 09/04/2021; she is on 2000 mcg B12 qd. There is no RPR on record.  These studies will be updated.

## 2021-11-12 NOTE — Progress Notes (Signed)
NURSING HOME LOCATION:  [facility] ROOM NUMBER:  [id number]  CODE STATUS:  Full Code  PCP:  Durenda Age NP  This is a comprehensive admission note to this SNFperformed on this date less than 30 days from date of admission. Included are preadmission medical/surgical history; reconciled medication list; family history; social history and comprehensive review of systems.  Corrections and additions to the records were documented. Comprehensive physical exam was also performed. Additionally a clinical summary was entered for each active diagnosis pertinent to this admission in the Problem List to enhance continuity of care.  HPI: The patient was hospitalized 11/04/2021 - 11/10/2021 presenting from the SNF with metabolic encephalopathy.  O2 sats were 87% on room air.  CT scan of the chest/abdomen and pelvis revealed no PTE.  Large pericardial effusion was documented.  Urinalysis was grossly abnormal and she was empirically treated for UTI with Rocephin.  The 11/04/2021 cath urine subsequently revealed multiple species. Pericardiocentesis was performed on 12/30 with removal of 450 mL of bloody fluid.  Pericardial drain was left in until 11/08/2021.  Eliquis prescribed for history of cardioembolic stroke in the context of PAF was discontinued & to be held until 11/10/2021. Course was also complicated by acute on chronic diastolic congestive heart failure with acute hypoxic respiratory failure.  Aggressive intervention resulted in improvement with no evidence of residual volume overload. AKI was present at admission with a BUN of 63, creatinine 1.25, and GFR of 47 indicating CKD low stage IIIa.  On 12/30 creatinine was 0.99 and GFR greater than 60 indicating CKD stage II.  Prerenal azotemia persisted with a BUN of 44. Protein caloric malnutrition was suggested by an albumin of 2.2 and total protein of 5.5. Normochromic normocytic anemia was present with H/H of 11.3/33.3.  On  09/04/2021 B12 level had been 162. B12 supplement was initiated @ that time. During hospitalization glucoses ranged from 155 up to 302.  On 12/29 A1c was 7.8% which is adequate control in view of her age and advanced comorbidities.  Levemir was increased to 12 units twice daily. She was felt be stable enough to return to the SNF with rehab as tolerated.  Low-sodium diet was prescribed. With the clinical treatment of possible UTI; there was some improvement in her metabolic encephalopathy but baseline confusion persisted.  Past medical and surgical history: Patient had been hospitalized 10/26 - 09/04/2021 after recurrent falls in her home.  Acute CVA of cardioembolic etiology was clinically present in the context of history of PAF.  Other diagnoses include COPD, history of diverticulosis, essential hypertension, history of colon polyps, hypothyroidism, IBS, PTSD, and diabetes with CKD. Surgeries and procedures include colonoscopies, cholecystectomy, breast biopsy, and EGD with esophageal dilation.  Social history: Nondrinker; phenomenal smoking history of 86 pack years.  Family history: Positive for heart disease, stroke, cancer, and schizophrenia.   Review of systems: Clinical neurocognitive deficits made validity of responses questionable , preventing ROS completion.  When asked how she were doing the response was "not too well, just tired".  When asked why she had been hospitalized she stated "I can't open up my prize".  As she stated this, she would move her right hand up and down the right inferior thigh and upper shin.  Subsequently she stated "had a heart attack".  She gave the date as "October 12,12".  Physical exam:  Pertinent or positive findings: She appears older than her stated age.  When I entered the room she was in the  bed talking to herself.  As noted she confabulated.  Facies are weathered.  Ptosis is present bilaterally, greater on the right.  The left nasolabial fold is decreased.   She is edentulous.  There is fine hirsutism over the chin.  Slight gallop rhythm was suggested with a faint murmur.  Pedal pulses are decreased.  There is asymmetric atrophy & interosseous wasting of the lower extremities.    General appearance: no acute distress, increased work of breathing is present.   Lymphatic: No lymphadenopathy about the head, neck, axilla. Eyes: No conjunctival inflammation or lid edema is present. There is no scleral icterus. Ears:  External ear exam shows no significant lesions or deformities.   Nose:  External nasal examination shows no deformity or inflammation. Nasal mucosa are pink and moist without lesions, exudates Neck:  No thyromegaly, masses, tenderness noted.    Heart:  No click, rub.  Lungs: without wheezes, rhonchi, rales, rubs. Abdomen: Bowel sounds are normal.  Abdomen is soft and nontender with no organomegaly, hernias, masses. GU: Deferred  Extremities:  No cyanosis, clubbing, edema. Neurologic exam:  Balance, Rhomberg, finger to nose testing could not be completed due to clinical state Skin: Warm & dry w/o tenting. No significant lesions or rash.  See clinical summary under each active problem in the Problem List with associated updated therapeutic plan

## 2021-11-12 NOTE — Assessment & Plan Note (Signed)
No neck vein distention or peripheral edema present at this time.  CHF appears to be compensated clinically.

## 2021-11-12 NOTE — Assessment & Plan Note (Addendum)
11/04/2021 - 11/10/2021 albumin 2.2 and total protein 5.5.  Asymmetric atrophy & interosseous wasting of lower extremities , probably sequellae of CVA. Continue Magic Cup protein supplement.  Nutrition consult at SNF.

## 2021-11-12 NOTE — Assessment & Plan Note (Signed)
11/05/2019 2- 11/10/2021 AKI with BUN of 63, creatinine 1.25 and GFR of 47 indicating CKD stage IIIa.  With aggressive treatment of hypoxic respiratory failure in the context of acute on chronic congestive heart failure and clinical UTI; creatinine 0.99 and GFR greater than 60 indicating CKD stage II.  Persistent prerenal azotemia with a BUN of 44.  No change in medications unless there is progression of the CKD.

## 2021-11-12 NOTE — Assessment & Plan Note (Signed)
Monitor for clinical recurrence with repeat ECHO if such suggested

## 2021-11-12 NOTE — Patient Instructions (Signed)
See assessment and plan under each diagnosis in the problem list and acutely for this visit 

## 2021-11-12 NOTE — Assessment & Plan Note (Signed)
B12 level will be rechecked as she is on 2000 mcg of B12 daily.

## 2021-11-13 LAB — VITAMIN B12: Vitamin B-12: >2000

## 2021-11-19 ENCOUNTER — Ambulatory Visit (HOSPITAL_COMMUNITY): Payer: PRIVATE HEALTH INSURANCE | Attending: Cardiovascular Disease

## 2021-11-19 ENCOUNTER — Encounter: Payer: Self-pay | Admitting: Adult Health

## 2021-11-19 ENCOUNTER — Other Ambulatory Visit: Payer: Self-pay

## 2021-11-19 ENCOUNTER — Non-Acute Institutional Stay (SKILLED_NURSING_FACILITY): Payer: Medicare Other | Admitting: Adult Health

## 2021-11-19 DIAGNOSIS — I10 Essential (primary) hypertension: Secondary | ICD-10-CM | POA: Diagnosis not present

## 2021-11-19 DIAGNOSIS — Z23 Encounter for immunization: Secondary | ICD-10-CM

## 2021-11-19 DIAGNOSIS — M109 Gout, unspecified: Secondary | ICD-10-CM

## 2021-11-19 DIAGNOSIS — I3139 Other pericardial effusion (noninflammatory): Secondary | ICD-10-CM | POA: Diagnosis present

## 2021-11-19 DIAGNOSIS — I5032 Chronic diastolic (congestive) heart failure: Secondary | ICD-10-CM | POA: Diagnosis not present

## 2021-11-19 LAB — ECHOCARDIOGRAM LIMITED
Area-P 1/2: 4.6 cm2
S' Lateral: 2.1 cm

## 2021-11-19 NOTE — Progress Notes (Signed)
Location:  [place] Room Number: [place] of Service:  SNF (31) Provider:  Durenda Age, DNP, FNP-BC  Patient Care Team: Hendricks Limes, MD as PCP - General (Internal Medicine) Rosita Fire, MD (Internal Medicine) Satira Sark, MD (Cardiology) [name], MD (Gastroenterology) [name], NP as Nurse Practitioner (Internal Medicine)  Extended Emergency Contact Information Primary Emergency Contact: [facility] Address: [address]          [place], [place] [address] [name] of [place] Phone: [phone] Relation: Son Secondary Emergency Contact: [place] Phone: [phone] Relation: Other  Code Status: Full code  Goals of care: Advanced Directive information Advanced Directives 11/11/2021  Does Patient Have a Medical Advance Directive? No  Type of Advance Directive -  [place] in Chart? -  Would patient like information on creating a medical advance directive? -  Pre-existing out of facility DNR order (yellow form or pink MOST form) -  Some encounter information is confidential and restricted. Go to Review Flowsheets activity to see all data.     Chief Complaint  Patient presents with   Acute Visit    Elevated BPs    HPI:  Pt is a 71 y.o. female seen today for an acute visit.  She is a short-term rehabilitation resident of [facility]. SBPs noted to be elevated -142, 150, 153, 150, 164, 171.  She denies having headache nor dizziness.  She takes furosemide 20 mg 1 tab every other day for CHF. No noted SOB.  She has a PMH of hypertension, COPD, type 2 diabetes mellitus, chronic diastolic heart failure, hypothyroidism and recent stroke with left hemiparesis.   Past Medical History:  Diagnosis Date   Arthritis    COPD (chronic obstructive pulmonary disease) ([place])    Diverticulosis    Essential hypertension, benign    Hx of colonic polyps     Hypothyroidism    IBS (irritable bowel syndrome)    Ketoacidosis    Mixed hyperlipidemia    PTSD (post-traumatic stress disorder)    S/P colonoscopy 11/2009   RMR: pancolonic diverticula, polypoid rectal mucosa with  prominent lymphoid aggregates, repeat in Jan 2016    Stress incontinence, female    Tobacco use    Type 2 diabetes mellitus ([place])    Past Surgical History:  Procedure Laterality Date   Bilateral tubal ligation     BREAST BIOPSY Left 10/16/2014   negative   CHOLECYSTECTOMY  09/11/2012   Procedure: LAPAROSCOPIC CHOLECYSTECTOMY;  Surgeon: Jamesetta So, MD;  Location: [facility] OR[facility];  Service: General;  Laterality: N/A;   COLONOSCOPY  11/17/2009   Dr. Gala Romney: normal rectum, pancolonic diverticula, polyps benign, no adenomas.    COLONOSCOPY N/A 02/12/2015   Dr. Gala Romney: colonic diverticulosis, multiple colonic polyps (tubular adenomas), negative segmental biopsies. Surveillance in 2021   ESOPHAGOGASTRODUODENOSCOPY  11/23/2011   Dr. Gala Romney: Erosive reflux esophagitis; small hiatal hernia; esophagus dilated empirically   MALONEY DILATION  11/23/2011   Procedure: Venia Minks DILATION;  Surgeon: Daneil Dolin, MD;  Location: [facility] SUITE;  Service: Endoscopy;  Laterality: N/A;   PERICARDIOCENTESIS N/A 11/06/2021   Procedure: PERICARDIOCENTESIS;  Surgeon: Jettie Booze, MD;  Location: [facility] CV LAB;  Service: Cardiovascular;  Laterality: N/A;   SKIN LESION EXCISION     on buttocks   TUBAL LIGATION      No Known Allergies  Outpatient Encounter Medications as of 11/19/2021  Medication Sig   acetaminophen (  TYLENOL) 325 MG tablet Take 650 mg by mouth every 5 (five) hours as needed for mild pain.   albuterol (VENTOLIN HFA) 108 (90 Base) MCG/ACT inhaler Inhale 2 puffs into the lungs every 4 (four) hours as needed for wheezing or shortness of breath.   apixaban (ELIQUIS) 5 MG TABS tablet Take 5 mg by mouth 2 (two) times daily.   aspirin 81 MG chewable tablet Chew 81 mg by mouth daily.    atorvastatin (LIPITOR) 80 MG tablet Take 1 tablet (80 mg total) by mouth at bedtime.   bisacodyl (DULCOLAX) 10 MG suppository Place 10 mg rectally daily as needed for moderate constipation or severe constipation. Use only if Milk of Magnesia does not work as needed every 24 hours.   Calcium Carb-Cholecalciferol (OYSTER SHELL CALCIUM W/D) 500-200 MG-UNIT TABS Take 1 tablet by mouth 2 (two) times daily.   cetirizine (ZYRTEC) 10 MG tablet Take 10 mg by mouth daily.   colchicine 0.6 MG tablet Take 1 tablet (0.6 mg total) by mouth daily.   colestipol (COLESTID) 1 g tablet TAKE 2 TABLETS DAILY FOR DIARRHEA. DO NOT TAKE WITHIN 2 HOURS OF OTHER ORAL MEDICATIONS. HOLD FOR CONSTIPATION.   cyanocobalamin ([employer] VITAMIN B12) 2000 MCG tablet Take 1 tablet (2,000 mcg total) by mouth daily.   diltiazem (CARDIZEM CD) 120 MG 24 hr capsule Take 1 capsule (120 mg total) by mouth daily.   divalproex (DEPAKOTE ER) 500 MG 24 hr tablet Take 2 tablets (1,000 mg total) by mouth every evening.   divalproex (DEPAKOTE) 250 MG DR tablet Take 1 tablet (250 mg total) by mouth daily.   furosemide (LASIX) 20 MG tablet Take 1 tablet (20 mg total) by mouth every other day.   insulin detemir (LEVEMIR) 100 UNIT/ML injection Inject 0.12 mLs (12 Units total) into the skin 2 (two) times daily.   levothyroxine (SYNTHROID) 200 MCG tablet Take 200 mcg by mouth daily before breakfast.   magnesium hydroxide (MILK OF MAGNESIA) 400 MG/5ML suspension Take 30 mLs by mouth daily as needed for mild constipation or moderate constipation. If no BM in 3 days, take 30cc every 24 hours PRN   mirtazapine (REMERON) 15 MG tablet Take 15 mg by mouth at bedtime.   nicotine polacrilex (NICORETTE) 4 MG gum Take 4 mg by mouth every 2 (two) hours as needed for smoking cessation. **avoid food/drink for 15 minutes before and after**   Nutritional Supplements (NUTRITIONAL SUPPLEMENT PO) Take by mouth. Magic Cup by mouth once Daily   ondansetron (ZOFRAN) 4 MG tablet  Take 1 tablet (4 mg total) by mouth every 8 (eight) hours as needed for nausea or vomiting.   pantoprazole (PROTONIX) 40 MG tablet TAKE 1 TABLET BY MOUTH ONCE DAILY 30 MINTUES BEFORE BREAKFAST.   PARoxetine (PAXIL) 40 MG tablet Take 1 tablet (40 mg total) by mouth daily.   QUEtiapine (SEROQUEL) 100 MG tablet Take 1 tablet (100 mg total) by mouth at bedtime.   senna-docusate (SENOKOT-S) 8.6-50 MG tablet Take 1 tablet by mouth at bedtime as needed for mild constipation.   Sodium Phosphates (RA SALINE ENEMA RE) Place 1 Dose rectally once as needed (severe constipation). If no relief from both milk of magnesia, and Bisacodyl suppository, administer one dose of disposable saline enema.   No facility-administered encounter medications on file as of 11/19/2021.    Review of Systems unable to obtain due to dementia.    Immunization History  Administered Date(s) Administered   Fluad Quad(high Dose 65+) 07/09/2021   Moderna Sars-Covid-2  Vaccination 11/16/2019, 12/10/2019   Tdap 07/09/2021   Zoster Recombinat (Shingrix) 07/09/2021   Pertinent  Health Maintenance Due  Topic Date Due   OPHTHALMOLOGY EXAM  Never done   URINE MICROALBUMIN  Never done   HEMOGLOBIN A1C  05/06/2022   FOOT EXAM  08/04/2022   MAMMOGRAM  10/15/2023   COLONOSCOPY (Pts 45-6yrs Insurance coverage will need to be confirmed)  02/11/2025   INFLUENZA VACCINE  Completed   DEXA SCAN  Completed   Fall Risk 11/07/2021 11/08/2021 11/08/2021 11/09/2021 11/10/2021  Falls in the past year? - - - - -  Number of falls in past year - - - - -  Was there an injury with Fall? - - - - -  Fall Risk Category Calculator - - - - -  Fall Risk Category - - - - -  Patient Fall Risk Level High fall risk High fall risk High fall risk High fall risk High fall risk  Patient at Risk for Falls Due to - - - - -  Fall risk Follow up - - - - -     Vitals:   11/19/21 1000  BP: (!) 142/59  Pulse: 100  Resp: 18  Temp: 97.7 F (36.5 C)  Weight: 139 lb  (63 kg)  Height: 5\' 4"  (1.626 m)   Body mass index is 23.86 kg/m.  Physical Exam Constitutional:      Appearance: Normal appearance.  HENT:     Head: Normocephalic and atraumatic.     Nose: Nose normal.     Mouth/Throat:     Mouth: Mucous membranes are moist.  Eyes:     Conjunctiva/sclera: Conjunctivae normal.  Cardiovascular:     Rate and Rhythm: Normal rate. Rhythm irregular.  Pulmonary:     Effort: Pulmonary effort is normal.     Breath sounds: Normal breath sounds.  Abdominal:     General: Bowel sounds are normal.     Palpations: Abdomen is soft.  Musculoskeletal:     Cervical back: Normal range of motion.  Skin:    General: Skin is warm and dry.  Neurological:     Mental Status: She is alert. Mental status is at baseline. She is disoriented.     Motor: No weakness.     Comments: Left-sided weakness.  Psychiatric:        Mood and Affect: Mood normal.      Labs reviewed: Recent Labs    12/11/20 0643 12/12/20 0416 09/02/21 2018 11/04/21 1959 11/04/21 2006 11/05/21 0425 11/06/21 0319  NA 141 140   < > 136 139 133* 136  K 3.6 3.8   < > 4.6 4.5 4.6 4.1  CL 102 102   < > 99  --  96* 97*  CO2 31 29   < > 28  --  26 31  GLUCOSE 74 119*   < > 164*  --  210* 172*  BUN 30* 34*   < > 63*  --  54* 44*  CREATININE 0.74 0.75   < > 1.25*  --  1.05* 0.99  CALCIUM 9.5 9.6   < > 10.1  --  9.1 10.3  MG 1.7 1.5*  --   --   --   --  1.7  PHOS 2.9 2.9  --   --   --   --  2.6   < > = values in this interval not displayed.   Recent Labs    09/03/21 0357 11/04/21 1959 11/05/21 0425  AST 15 42* 44*  ALT 16 25 27   ALKPHOS 52 70 61  BILITOT 0.7 0.7 1.1  PROT 6.4* 6.2* 5.5*  ALBUMIN 3.6 2.4* 2.2*   Recent Labs    09/02/21 2018 09/02/21 2024 11/04/21 1959 11/04/21 2006 11/05/21 0425 11/06/21 0319  WBC 9.4   < > 7.5  --  7.6 7.1  NEUTROABS 5.9  --  5.0  --   --  3.6  HGB 13.8   < > 12.6 11.6* 10.7* 11.3*  HCT 39.9   < > 38.1 34.0* 32.3* 33.3*  MCV 91.5   < >  95.3  --  96.1 92.8  PLT 201   < > 290  --  272 264   < > = values in this interval not displayed.   Lab Results  Component Value Date   TSH 0.621 11/05/2021   Lab Results  Component Value Date   HGBA1C 7.8 (H) 11/05/2021   Lab Results  Component Value Date   CHOL 275 (H) 09/03/2021   HDL 39 (L) 09/03/2021   LDLCALC 202 (H) 09/03/2021   TRIG 169 (H) 09/03/2021   CHOLHDL 7.1 09/03/2021    Significant Diagnostic Results in last 30 days:  DG Chest 1 View  Result Date: 11/04/2021 CLINICAL DATA:  Altered mental status. EXAM: CHEST  1 VIEW COMPARISON:  September 02, 2021 FINDINGS: Mild to moderate severity infiltrate is seen within the left lung base. There is no evidence of a pleural effusion or pneumothorax. There is moderate to marked severity enlargement of the cardiac silhouette. This is increased in size when compared to the prior study, which may be technical in nature. Moderate to marked severity calcification of the aortic arch is noted. The visualized skeletal structures are unremarkable. IMPRESSION: Moderate to marked severity cardiomegaly with mild to moderate severity left basilar infiltrate. Electronically Signed   By: Virgina Norfolk M.D.   On: 11/04/2021 20:12   CT HEAD WO CONTRAST (5MM)  Result Date: 11/04/2021 CLINICAL DATA:  Altered level of consciousness for 2 days EXAM: CT HEAD WITHOUT CONTRAST TECHNIQUE: Contiguous axial images were obtained from the base of the skull through the vertex without intravenous contrast. COMPARISON:  09/03/2021, 09/02/2021 FINDINGS: Brain: Chronic ischemic changes are seen within the bilateral basal ganglia, right greater than left. No evidence of acute infarct or hemorrhage. Lateral ventricles and remaining midline structures are unremarkable. No acute extra-axial fluid collections. No mass effect. Vascular: No hyperdense vessel or unexpected calcification. Skull: Normal. Negative for fracture or focal lesion. Sinuses/Orbits: No acute  finding. Other: None. IMPRESSION: 1. Chronic small vessel ischemic changes within the basal ganglia. No acute intracranial process. Electronically Signed   By: Randa Ngo M.D.   On: 11/04/2021 19:47   CT Angio Chest PE W and/or Wo Contrast  Result Date: 11/04/2021 CLINICAL DATA:  Hypoxia, abdominal pain EXAM: CT ANGIOGRAPHY CHEST CT ABDOMEN AND PELVIS WITH CONTRAST TECHNIQUE: Multidetector CT imaging of the chest was performed using the standard protocol during bolus administration of intravenous contrast. Multiplanar CT image reconstructions and MIPs were obtained to evaluate the vascular anatomy. Multidetector CT imaging of the abdomen and pelvis was performed using the standard protocol during bolus administration of intravenous contrast. CONTRAST:  175mL OMNIPAQUE IOHEXOL 350 MG/ML SOLN COMPARISON:  11/04/2021, 03/18/2020 FINDINGS: CTA CHEST FINDINGS Cardiovascular: This is a technically adequate evaluation of the pulmonary vasculature. No filling defects or pulmonary emboli. There is a large pericardial effusion measuring up to 2.8 cm in thickness. There is subtle pericardial  enhancement, concerning for exudative effusion. No evidence of thoracic aortic aneurysm or dissection. Moderate atherosclerosis of the aorta and coronary vasculature. Mediastinum/Nodes: No enlarged mediastinal, hilar, or axillary lymph nodes. Thyroid gland, trachea, and esophagus demonstrate no significant findings. Lungs/Pleura: Small left pleural effusion with left lower lobe compressive atelectasis. Mild bilateral perihilar interstitial and ground-glass opacities consistent with mild edema. No pneumothorax. Central airways are patent. Musculoskeletal: No acute or destructive bony lesions. Reconstructed images demonstrate no additional findings. Review of the MIP images confirms the above findings. CT ABDOMEN and PELVIS FINDINGS Hepatobiliary: Reflux of contrast into the hepatic veins suggests cardiac dysfunction. No focal liver  abnormality. Gallbladder is surgically absent. Pancreas: Unremarkable. No pancreatic ductal dilatation or surrounding inflammatory changes. Spleen: Normal in size without focal abnormality. Adrenals/Urinary Tract: Adrenal glands are unremarkable. Kidneys are normal, without renal calculi, focal lesion, or hydronephrosis. Bladder is unremarkable. Stomach/Bowel: No bowel obstruction or ileus. Normal appendix right lower quadrant. Mild wall thickening involving the distal transverse and descending colon is nonspecific given decompressed state, and underlying colitis cannot be excluded. No associated inflammatory change. Vascular/Lymphatic: Significant atherosclerosis of the aorta, with focal high-grade stenosis of the distal aorta just proximal to the bifurcation. Please correlate with any symptoms of claudication. No pathologic adenopathy within the abdomen or pelvis. Reproductive: Uterus and bilateral adnexa are unremarkable. Other: No free fluid or free gas.  No abdominal wall hernia. Musculoskeletal: No acute or destructive bony lesions. Prominent spondylosis at L5-S1. Reconstructed images demonstrate no additional findings. Review of the MIP images confirms the above findings. IMPRESSION: 1. Large pericardial effusion, with pericardial enhancement concerning for pericarditis and exudative effusion. 2. Mild perihilar edema, small left pleural effusion, and reflux of contrast into the hepatic veins may reflect an element of constrictive pericarditis. Correlation with echocardiography is recommended. 3. No evidence of pulmonary embolus. 4. Nonspecific segmental distal colonic wall thickening. While this could be due to decompressed state, mild colitis cannot be excluded. 5. Aortic Atherosclerosis (ICD10-I70.0). High-grade stenosis at the aortic bifurcation from heavily calcified atheromatous plaque unchanged. Please correlate with any symptoms of lower extremity claudication. Electronically Signed   By: Randa Ngo M.D.   On: 11/04/2021 23:15   MR BRAIN WO CONTRAST  Result Date: 11/05/2021 CLINICAL DATA:  Altered mental status, recent history of multiple territory strokes EXAM: MRI HEAD WITHOUT CONTRAST TECHNIQUE: Multiplanar, multiecho pulse sequences of the brain and surrounding structures were obtained without intravenous contrast. COMPARISON:  CT head dated 1 day prior, MR head 09/03/2021 FINDINGS: Brain: There is faintly elevated DWI signal in the posterior limb of the right internal capsule and lentiform nucleus without convincing low ADC signal. There is associated FLAIR signal abnormality. This signal abnormality was present on the brain MRI from 09/03/2021 and likely reflects late subacute infarct. Curvilinear SWI signal dropout in this region likely reflects petechial blood products. There is an additional faint focus of diffusion restriction in the posterior limb of the left internal capsule with mildly low ADC signal and FLAIR signal abnormality which was not present on the prior brain MRI, likely reflecting more recent early subacute stroke. There is no evidence of associated hemorrhage. There is no other evidence of acute infarct. There is no evidence of acute intracranial hemorrhage or extra-axial fluid collection. There is unchanged mild global parenchymal volume loss with prominence of the ventricular system and extra-axial CSF spaces. Additional foci of FLAIR signal abnormality in the subcortical and periventricular white matter likely reflect sequela of chronic white matter microangiopathy. A few additional remote  lacunar infarcts are unchanged. There is no solid mass lesion.  There is no midline shift. Vascular: Normal flow voids. Skull and upper cervical spine: Normal marrow signal. Sinuses/Orbits: The paranasal sinuses are clear. The globes and orbits are unremarkable. Other: There are bilateral mastoid effusions, unchanged. IMPRESSION: 1. Small infarct in the left internal capsule is new since  the prior brain MRI of 09/03/2021, likely subacute in chronicity. 2. Evolving late subacute to early chronic infarcts in the right internal capsule/basal ganglia. 3. No other acute infarct or other acute intracranial pathology. Electronically Signed   By: Valetta Mole M.D.   On: 11/05/2021 12:27   CT ABDOMEN PELVIS W CONTRAST  Result Date: 11/04/2021 CLINICAL DATA:  Hypoxia, abdominal pain EXAM: CT ANGIOGRAPHY CHEST CT ABDOMEN AND PELVIS WITH CONTRAST TECHNIQUE: Multidetector CT imaging of the chest was performed using the standard protocol during bolus administration of intravenous contrast. Multiplanar CT image reconstructions and MIPs were obtained to evaluate the vascular anatomy. Multidetector CT imaging of the abdomen and pelvis was performed using the standard protocol during bolus administration of intravenous contrast. CONTRAST:  155mL OMNIPAQUE IOHEXOL 350 MG/ML SOLN COMPARISON:  11/04/2021, 03/18/2020 FINDINGS: CTA CHEST FINDINGS Cardiovascular: This is a technically adequate evaluation of the pulmonary vasculature. No filling defects or pulmonary emboli. There is a large pericardial effusion measuring up to 2.8 cm in thickness. There is subtle pericardial enhancement, concerning for exudative effusion. No evidence of thoracic aortic aneurysm or dissection. Moderate atherosclerosis of the aorta and coronary vasculature. Mediastinum/Nodes: No enlarged mediastinal, hilar, or axillary lymph nodes. Thyroid gland, trachea, and esophagus demonstrate no significant findings. Lungs/Pleura: Small left pleural effusion with left lower lobe compressive atelectasis. Mild bilateral perihilar interstitial and ground-glass opacities consistent with mild edema. No pneumothorax. Central airways are patent. Musculoskeletal: No acute or destructive bony lesions. Reconstructed images demonstrate no additional findings. Review of the MIP images confirms the above findings. CT ABDOMEN and PELVIS FINDINGS Hepatobiliary:  Reflux of contrast into the hepatic veins suggests cardiac dysfunction. No focal liver abnormality. Gallbladder is surgically absent. Pancreas: Unremarkable. No pancreatic ductal dilatation or surrounding inflammatory changes. Spleen: Normal in size without focal abnormality. Adrenals/Urinary Tract: Adrenal glands are unremarkable. Kidneys are normal, without renal calculi, focal lesion, or hydronephrosis. Bladder is unremarkable. Stomach/Bowel: No bowel obstruction or ileus. Normal appendix right lower quadrant. Mild wall thickening involving the distal transverse and descending colon is nonspecific given decompressed state, and underlying colitis cannot be excluded. No associated inflammatory change. Vascular/Lymphatic: Significant atherosclerosis of the aorta, with focal high-grade stenosis of the distal aorta just proximal to the bifurcation. Please correlate with any symptoms of claudication. No pathologic adenopathy within the abdomen or pelvis. Reproductive: Uterus and bilateral adnexa are unremarkable. Other: No free fluid or free gas.  No abdominal wall hernia. Musculoskeletal: No acute or destructive bony lesions. Prominent spondylosis at L5-S1. Reconstructed images demonstrate no additional findings. Review of the MIP images confirms the above findings. IMPRESSION: 1. Large pericardial effusion, with pericardial enhancement concerning for pericarditis and exudative effusion. 2. Mild perihilar edema, small left pleural effusion, and reflux of contrast into the hepatic veins may reflect an element of constrictive pericarditis. Correlation with echocardiography is recommended. 3. No evidence of pulmonary embolus. 4. Nonspecific segmental distal colonic wall thickening. While this could be due to decompressed state, mild colitis cannot be excluded. 5. Aortic Atherosclerosis (ICD10-I70.0). High-grade stenosis at the aortic bifurcation from heavily calcified atheromatous plaque unchanged. Please correlate with  any symptoms of lower extremity claudication. Electronically Signed  By: Randa Ngo M.D.   On: 11/04/2021 23:15   CARDIAC CATHETERIZATION  Result Date: 11/10/2021   Successful pericardiocentesis removing 420 cc of bloody fluid. Await cytology results and other laboratory analysis of the pericardial fluid. Consider removing pericardial drain tomorrow depending on the output. Results conveyed to son, Charlotte Crumb.   ECHOCARDIOGRAM COMPLETE  Result Date: 11/05/2021    ECHOCARDIOGRAM REPORT   Patient Name:   BROGAN ENGLAND Date of Exam: 11/05/2021 Medical Rec #:  [id number]      Height:       63.5 in Accession #:    [id number]     Weight:       135.0 lb Date of Birth:  1951-03-10      BSA:          1.646 m Patient Age:    50 years       BP:           160/88 mmHg Patient Gender: F              HR:           94 bpm. Exam Location:  Inpatient Procedure: 2D Echo, Cardiac Doppler and Color Doppler Indications:    Stroke  History:        Patient has prior history of Echocardiogram examinations, most                 recent 09/03/2021. Stroke; Risk Factors:Former Smoker,                 Hypertension and Diabetes.  Sonographer:    Merrie Roof RDCS Referring Phys: [id number] [place]  1. Left ventricular ejection fraction, by estimation, is 60 to 65%. The left ventricle has normal function. The left ventricle has no regional wall motion abnormalities. Left ventricular diastolic parameters are consistent with Grade I diastolic dysfunction (impaired relaxation).  2. Right ventricular systolic function is normal. The right ventricular size is normal. There is normal pulmonary artery systolic pressure.  3. Large circumferential pericardial effusion. Increased in since since 08/2021. Large pericardial effusion. The pericardial effusion is circumferential. There is no evidence of cardiac tamponade.  4. The mitral valve is normal in structure. No evidence of mitral valve regurgitation. No evidence of mitral  stenosis.  5. The aortic valve is tricuspid. Aortic valve regurgitation is not visualized. No aortic stenosis is present.  6. The inferior vena cava is normal in size with greater than 50% respiratory variability, suggesting right atrial pressure of 3 mmHg. FINDINGS  Left Ventricle: Left ventricular ejection fraction, by estimation, is 60 to 65%. The left ventricle has normal function. The left ventricle has no regional wall motion abnormalities. The left ventricular internal cavity size was normal in size. There is  no left ventricular hypertrophy. Left ventricular diastolic parameters are consistent with Grade I diastolic dysfunction (impaired relaxation). Right Ventricle: The right ventricular size is normal. No increase in right ventricular wall thickness. Right ventricular systolic function is normal. There is normal pulmonary artery systolic pressure. The tricuspid regurgitant velocity is 1.62 m/s, and  with an assumed right atrial pressure of 3 mmHg, the estimated right ventricular systolic pressure is 77.4 mmHg. Left Atrium: Left atrial size was normal in size. Right Atrium: Right atrial size was normal in size. Pericardium: Large circumferential pericardial effusion. Increased in since since 08/2021. A large pericardial effusion is present. The pericardial effusion is circumferential. There is diastolic collapse of the right ventricular free wall. There is no evidence  of cardiac tamponade. Presence of epicardial fat layer. Mitral Valve: The mitral valve is normal in structure. No evidence of mitral valve regurgitation. No evidence of mitral valve stenosis. Tricuspid Valve: The tricuspid valve is normal in structure. Tricuspid valve regurgitation is trivial. No evidence of tricuspid stenosis. Aortic Valve: The aortic valve is tricuspid. Aortic valve regurgitation is not visualized. No aortic stenosis is present. Aortic valve mean gradient measures 4.0 mmHg. Aortic valve peak gradient measures 6.9 mmHg.  Aortic valve area, by VTI measures 2.67 cm. Pulmonic Valve: The pulmonic valve was normal in structure. Pulmonic valve regurgitation is not visualized. No evidence of pulmonic stenosis. Aorta: The aortic root is normal in size and structure. Venous: The inferior vena cava is normal in size with greater than 50% respiratory variability, suggesting right atrial pressure of 3 mmHg. IAS/Shunts: No atrial level shunt detected by color flow Doppler.  LEFT VENTRICLE PLAX 2D LVIDd:         3.60 cm   Diastology LVIDs:         2.40 cm   LV e' medial:    6.76 cm/s LV PW:         0.90 cm   LV E/e' medial:  10.7 LV IVS:        1.10 cm   LV e' lateral:   7.42 cm/s LVOT diam:     1.80 cm   LV E/e' lateral: 9.8 LV SV:         51 LV SV Index:   31 LVOT Area:     2.54 cm  RIGHT VENTRICLE          IVC RV Basal diam:  3.00 cm  IVC diam: 1.50 cm LEFT ATRIUM             Index        RIGHT ATRIUM           Index LA diam:        3.20 cm 1.94 cm/m   RA Area:     16.50 cm LA Vol (A2C):   41.5 ml 25.22 ml/m  RA Volume:   46.40 ml  28.19 ml/m LA Vol (A4C):   43.9 ml 26.67 ml/m LA Biplane Vol: 44.1 ml 26.80 ml/m  AORTIC VALVE AV Area (Vmax):    2.39 cm AV Area (Vmean):   2.31 cm AV Area (VTI):     2.67 cm AV Vmax:           131.00 cm/s AV Vmean:          90.100 cm/s AV VTI:            0.190 m AV Peak Grad:      6.9 mmHg AV Mean Grad:      4.0 mmHg LVOT Vmax:         123.00 cm/s LVOT Vmean:        81.700 cm/s LVOT VTI:          0.199 m LVOT/AV VTI ratio: 1.05  AORTA Ao Root diam: 2.80 cm Ao Asc diam:  2.90 cm MITRAL VALVE                TRICUSPID VALVE MV Area (PHT): 4.57 cm     TR Peak grad:   10.5 mmHg MV Decel Time: 166 msec     TR Vmax:        162.00 cm/s MV E velocity: 72.60 cm/s MV A velocity: 117.00 cm/s  SHUNTS MV E/A ratio:  0.62  Systemic VTI:  0.20 m                             Systemic Diam: 1.80 cm Skeet Latch MD Electronically signed by Skeet Latch MD Signature Date/Time: 11/05/2021/4:34:25 PM    Final     ECHOCARDIOGRAM LIMITED  Result Date: 11/19/2021    ECHOCARDIOGRAM LIMITED REPORT   Patient Name:   HANIAH PENNY Date of Exam: 11/19/2021 Medical Rec #:  [id number]      Height:       63.0 in Accession #:    [id number]     Weight:       139.8 lb Date of Birth:  [birth year]      BSA:          1.661 m Patient Age:    11 years       BP:           160/80 mmHg Patient Gender: F              HR:           95 bpm. Exam Location:  [address] Procedure: Limited Echo, Cardiac Doppler and Limited Color Doppler Indications:    I31.39 Pericardial effusion. LIMITED follow up pericardial                 effusion.  History:        Patient has prior history of Echocardiogram examinations, most                 recent 11/09/2021. Pericardial effusion. Pericarditis, Stroke and                 COPD, Arrythmias:Atrial Fibrillation; Risk Factors:Diabetes,                 Hypertension, Dyslipidemia, Current Smoker and Obesity.                 COVID-19. CKD stage 3.  Sonographer:    Basilia Jumbo BS, RDCS Referring Phys: [place]  1. Left ventricular ejection fraction, by estimation, is 65 to 70%. The left ventricle has normal function. Comparison(s): 11/09/21 EF 70-75%. Small pericardial effusion. FINDINGS  Left Ventricle: Left ventricular ejection fraction, by estimation, is 65 to 70%. The left ventricle has normal function. The left ventricular internal cavity size was normal in size. Pericardium: Trivial pericardial effusion is present. Aorta: The aortic root and ascending aorta are structurally normal, with no evidence of dilitation. LEFT VENTRICLE PLAX 2D LVIDd:         3.60 cm Diastology LVIDs:         2.10 cm LV e' medial:    8.27 cm/s LV PW:         0.80 cm LV E/e' medial:  9.7 LV IVS:        0.60 cm LV e' lateral:   8.27 cm/s                        LV E/e' lateral: 9.7  IVC IVC diam: 1.10 cm LEFT ATRIUM         Index       RIGHT ATRIUM LA diam:    2.80 cm 1.69 cm/m  RA Pressure: 3.00 mmHg   AORTA Ao Root  diam: 2.90 cm MITRAL VALVE                TRICUSPID VALVE  Estimated RAP:  3.00 mmHg MV Decel Time: 165 msec MV E velocity: 80.20 cm/s MV A velocity: 112.00 cm/s MV E/A ratio:  0.72 Mertie Moores MD Electronically signed by Mertie Moores MD Signature Date/Time: 11/19/2021/4:58:53 PM    Final    ECHOCARDIOGRAM LIMITED  Result Date: 11/09/2021    ECHOCARDIOGRAM LIMITED REPORT   Patient Name:   ELLIANAH CORDY Date of Exam: 11/09/2021 Medical Rec #:  [id number]      Height:       63.4 in Accession #:    [id number]     Weight:       145.5 lb Date of Birth:  [birth year]      BSA:          1.697 m Patient Age:    53 years       BP:           116/56 mmHg Patient Gender: F              HR:           72 bpm. Exam Location:  Inpatient Procedure: Limited Echo and Cardiac Doppler Indications:    Pericardial Effusion I31.3  History:        Patient has prior history of Echocardiogram examinations, most                 recent 11/06/2021. COPD, Arrythmias:Atrial Fibrillation; Risk                 Factors:Hypertension, Diabetes and Dyslipidemia. AMS (altered                 mental status), Pericardial Disease, chronic kidney disease.  Sonographer:    Alvino Chapel RCS Referring Phys: [place]  1. Left ventricular ejection fraction, by estimation, is 70 to 75%. The left ventricle has hyperdynamic function. The left ventricle has no regional wall motion abnormalities.  2. A small pericardial effusion is present. The pericardial effusion is circumferential.  3. Limited study to evaluate pericardial effusion FINDINGS  Left Ventricle: Left ventricular ejection fraction, by estimation, is 70 to 75%. The left ventricle has hyperdynamic function. The left ventricle has no regional wall motion abnormalities. Pericardium: A small pericardial effusion is present. The pericardial effusion is circumferential. Carlyle Dolly MD Electronically signed by Carlyle Dolly MD Signature Date/Time:  11/09/2021/6:25:11 PM    Final    ECHOCARDIOGRAM LIMITED  Result Date: 11/06/2021    ECHOCARDIOGRAM LIMITED REPORT   Patient Name:   MYKENZIE EBANKS Date of Exam: 11/06/2021 Medical Rec #:  [id number]      Height:       63.5 in Accession #:    [id number]     Weight:       143.5 lb Date of Birth:  February 23, 1951      BSA:          1.689 m Patient Age:    35 years       BP:           159/68 mmHg Patient Gender: F              HR:           88 bpm. Exam Location:  Inpatient Procedure: Limited Echo Indications:    pericardial tap  History:        Patient has prior history of Echocardiogram examinations, most                 recent 11/05/2021. Pericardial Disease,  chronic kidney disease                 and COPD, Signs/Symptoms:Altered Mental Status; Risk                 Factors:Diabetes and Hypertension.  Sonographer:    Johny Chess RDCS Referring Phys: [place]  1. Large circumferential pericardial effusion up to 2.0 cm posterior to the LV. No RA/RV diastolic collapse. No dopper interrogation. Echo guided pericardiocentesis performed. Last image shows catheter posterior to LV, with improvement of effusion on final image to 0.5 cm. Large pericardial effusion. The pericardial effusion is circumferential.  2. Left ventricular ejection fraction, by estimation, is 60 to 65%. The left ventricle has normal function. The left ventricle has no regional wall motion abnormalities.  3. Right ventricular systolic function is normal. The right ventricular size is normal. FINDINGS  Left Ventricle: Left ventricular ejection fraction, by estimation, is 60 to 65%. The left ventricle has normal function. The left ventricle has no regional wall motion abnormalities. Right Ventricle: The right ventricular size is normal. No increase in right ventricular wall thickness. Right ventricular systolic function is normal. Left Atrium: Left atrial size was normal in size. Right Atrium: Right atrial size was normal in  size. Pericardium: Large circumferential pericardial effusion up to 2.0 cm posterior to the LV. No RA/RV diastolic collapse. No dopper interrogation. Echo guided pericardiocentesis performed. Last image shows catheter posterior to LV, with improvement of effusion on final image to 0.5 cm. A large pericardial effusion is present. The pericardial effusion is circumferential. Presence of epicardial fat layer. Eleonore Chiquito MD Electronically signed by Eleonore Chiquito MD Signature Date/Time: 11/06/2021/5:52:14 PM    Final     Assessment/Plan  1. Essential hypertension, benign -  will increase diltiazem 24-hour ER from 120mg  to 180 mg daily  2. Acute gout, unspecified cause, unspecified site -  Uric acid 7.9, elevated  -  will start on allopurinol 100 mg give 1/2 tab = 50 mg daily -   Continue colchicine 0.6 mg 1 tab daily  3. Chronic diastolic heart failure (HCC) -  no SOB, continue furosemide 20 mg 1 tab every other day  4.  Need for shingles vaccine -   Shingrix 0.5 mL vaccine IM x1   Family/ staff Communication: Discussed plan of care with charge nurse  Labs/tests ordered: Hepatitis C antibody for screening    Durenda Age, DNP, MSN, FNP-BC [facility] [phone] (Monday-Friday 8:00 a.m. - 5:00 p.m.) [phone] (after hours)

## 2021-11-20 ENCOUNTER — Ambulatory Visit: Payer: Medicare (Managed Care) | Admitting: Endocrinology

## 2021-11-20 LAB — MICROALBUMIN, URINE: Microalb, Ur: 14.2

## 2021-11-20 LAB — MICROALBUMIN / CREATININE URINE RATIO: Microalb Creat Ratio: 429.3

## 2021-11-23 LAB — HM HEPATITIS C SCREENING LAB: HM Hepatitis Screen: NEGATIVE

## 2021-11-27 ENCOUNTER — Encounter: Payer: Self-pay | Admitting: Adult Health

## 2021-11-27 ENCOUNTER — Non-Acute Institutional Stay (SKILLED_NURSING_FACILITY): Payer: Medicare Other | Admitting: Adult Health

## 2021-11-27 DIAGNOSIS — N1831 Chronic kidney disease, stage 3a: Secondary | ICD-10-CM | POA: Diagnosis not present

## 2021-11-27 DIAGNOSIS — E1122 Type 2 diabetes mellitus with diabetic chronic kidney disease: Secondary | ICD-10-CM | POA: Diagnosis not present

## 2021-11-27 DIAGNOSIS — Z8673 Personal history of transient ischemic attack (TIA), and cerebral infarction without residual deficits: Secondary | ICD-10-CM | POA: Diagnosis not present

## 2021-11-27 DIAGNOSIS — I5032 Chronic diastolic (congestive) heart failure: Secondary | ICD-10-CM

## 2021-11-27 DIAGNOSIS — Z794 Long term (current) use of insulin: Secondary | ICD-10-CM

## 2021-11-27 NOTE — Progress Notes (Signed)
Location:  [facility] Room Number: 342-A Place of Service:  SNF (31) Provider:  Durenda Age, DNP, FNP-BC  Patient Care Team: Hendricks Limes, MD as PCP - General (Internal Medicine) Carrolyn Meiers, MD (Internal Medicine) Satira Sark, MD (Cardiology) [name], MD (Gastroenterology) [name], NP as Nurse Practitioner (Internal Medicine)  Extended Emergency Contact Information Primary Emergency Contact: [facility] Address: [address]          [place], [place] [address] [name] of [place] Phone: [phone] Relation: Son Secondary Emergency Contact: [place] Mobile Phone: [phone] Relation: Other  Code Status:  Full Code  Goals of care: Advanced Directive information Advanced Directives 11/28/2021  Does Patient Have a Medical Advance Directive? Yes  Type of Paramedic of [place];Living will  Does patient want to make changes to medical advance directive? No - Patient declined  Copy of [place] in Chart? No - copy requested, Physician notified  Would patient like information on creating a medical advance directive? -  Pre-existing out of facility DNR order (yellow form or pink MOST form) -  Some encounter information is confidential and restricted. Go to Review Flowsheets activity to see all data.     Chief Complaint  Patient presents with   Acute Visit    Short-term rehabilitation    HPI:  Pt is a 71 y.o. female seen today for a short-term rehabilitation visit. She is currently having PT and OT at [facility].  CBGs ranging from 99 to 278, with outlier 33 and 73. She takes Levemir 12 units SQ BID for diabetes mellitus.  She takes aspirin 81 mg 1 tab daily and rosuvastatin 20 mg 1 tab at bedtime for history of CVA. She has left-sided weakness.    Past Medical History:  Diagnosis Date   Arthritis    COPD (chronic  obstructive pulmonary disease) ([place])    Diverticulosis    Essential hypertension, benign    Hx of colonic polyps    Hypothyroidism    IBS (irritable bowel syndrome)    Ketoacidosis    Mixed hyperlipidemia    PTSD (post-traumatic stress disorder)    S/P colonoscopy 11/2009   RMR: pancolonic diverticula, polypoid rectal mucosa with  prominent lymphoid aggregates, repeat in Jan 2016    Stress incontinence, female    Tobacco use    Type 2 diabetes mellitus ([place])    Past Surgical History:  Procedure Laterality Date   Bilateral tubal ligation     BREAST BIOPSY Left 10/16/2014   negative   CHOLECYSTECTOMY  09/11/2012   Procedure: LAPAROSCOPIC CHOLECYSTECTOMY;  Surgeon: Jamesetta So, MD;  Location: [facility] OR[facility];  Service: General;  Laterality: N/A;   COLONOSCOPY  11/17/2009   Dr. Gala Romney: normal rectum, pancolonic diverticula, polyps benign, no adenomas.    COLONOSCOPY N/A 02/12/2015   Dr. Gala Romney: colonic diverticulosis, multiple colonic polyps (tubular adenomas), negative segmental biopsies. Surveillance in 2021   ESOPHAGOGASTRODUODENOSCOPY  11/23/2011   Dr. Gala Romney: Erosive reflux esophagitis; small hiatal hernia; esophagus dilated empirically   MALONEY DILATION  11/23/2011   Procedure: Venia Minks DILATION;  Surgeon: Daneil Dolin, MD;  Location: [facility] SU[facility];  Service: Endoscopy;  Laterality: N/A;   PERICARDIOCENTESIS N/A 11/06/2021   Procedure: PERICARDIOCENTESIS;  Surgeon: Jettie Booze, MD;  Location: [facility] CV LAB;  Service: Cardiovascular;  Laterality: N/A;   SKIN LESION EXCISION     on buttocks   TUBAL LIGATION  No Known Allergies  Outpatient Encounter Medications as of 11/27/2021  Medication Sig   albuterol (VENTOLIN HFA) 108 (90 Base) MCG/ACT inhaler Inhale 2 puffs into the lungs every 4 (four) hours as needed for wheezing or shortness of breath.   allopurinol (ZYLOPRIM) 100 MG tablet Take 50 mg by mouth daily. Idiopathic gout   aspirin 81 MG chewable tablet Chew 81 mg by  mouth daily.   bisacodyl (DULCOLAX) 10 MG suppository Place 10 mg rectally daily as needed for moderate constipation or severe constipation. Use only if Milk of Magnesia does not work as needed every 24 hours.   Calcium Carb-Cholecalciferol (OYSTER SHELL CALCIUM W/D) 500-200 MG-UNIT TABS Take 1 tablet by mouth 2 (two) times daily.   colchicine 0.6 MG tablet Take 1 tablet (0.6 mg total) by mouth daily.   colestipol (COLESTID) 1 g tablet TAKE 2 TABLETS DAILY FOR DIARRHEA. DO NOT TAKE WITHIN 2 HOURS OF OTHER ORAL MEDICATIONS. HOLD FOR CONSTIPATION.   cyanocobalamin ([employer] VITAMIN B12) 2000 MCG tablet Take 1 tablet (2,000 mcg total) by mouth daily.   diltiazem (CARDIZEM CD) 120 MG 24 hr capsule Take 1 capsule (120 mg total) by mouth daily.   divalproex (DEPAKOTE ER) 500 MG 24 hr tablet Take 2 tablets (1,000 mg total) by mouth every evening.   divalproex (DEPAKOTE) 250 MG DR tablet Take 1 tablet (250 mg total) by mouth daily.   furosemide (LASIX) 20 MG tablet Take 1 tablet (20 mg total) by mouth every other day.   insulin detemir (LEVEMIR) 100 UNIT/ML injection Inject 0.12 mLs (12 Units total) into the skin 2 (two) times daily.   levothyroxine (SYNTHROID) 200 MCG tablet Take 200 mcg by mouth daily before breakfast.   magnesium hydroxide (MILK OF MAGNESIA) 400 MG/5ML suspension Take 30 mLs by mouth daily as needed for mild constipation or moderate constipation. If no BM in 3 days, take 30cc every 24 hours PRN   mirtazapine (REMERON) 15 MG tablet Take 15 mg by mouth at bedtime.   NON FORMULARY Diet:Mech soft/thin diet consistency   Nutritional Supplements (NUTRITIONAL SUPPLEMENT PO) Take by mouth. Magic Cup by mouth once Daily   ondansetron (ZOFRAN) 4 MG tablet Take 1 tablet (4 mg total) by mouth every 8 (eight) hours as needed for nausea or vomiting.   pantoprazole (PROTONIX) 40 MG tablet TAKE 1 TABLET BY MOUTH ONCE DAILY 30 MINTUES BEFORE BREAKFAST.   PARoxetine (PAXIL) 40 MG tablet Take 1 tablet (40 mg  total) by mouth daily.   QUEtiapine (SEROQUEL) 100 MG tablet Take 1 tablet (100 mg total) by mouth at bedtime.   rosuvastatin (CRESTOR) 20 MG tablet Take 20 mg by mouth daily.   senna-docusate (SENOKOT-S) 8.6-50 MG tablet Take 1 tablet by mouth at bedtime as needed for mild constipation.   Sodium Phosphates (RA SALINE ENEMA RE) Place 1 Dose rectally once as needed (severe constipation). If no relief from both milk of magnesia, and Bisacodyl suppository, administer one dose of disposable saline enema.   acetaminophen (TYLENOL) 325 MG tablet Take 650 mg by mouth every 5 (five) hours as needed for mild pain.   [DISCONTINUED] apixaban (ELIQUIS) 5 MG TABS tablet Take 5 mg by mouth 2 (two) times daily.   [DISCONTINUED] atorvastatin (LIPITOR) 80 MG tablet Take 1 tablet (80 mg total) by mouth at bedtime.   [DISCONTINUED] cetirizine (ZYRTEC) 10 MG tablet Take 10 mg by mouth daily.   [DISCONTINUED] nicotine polacrilex (NICORETTE) 4 MG gum Take 4 mg by mouth every 2 (two) hours  as needed for smoking cessation. **avoid food/drink for 15 minutes before and after**   No facility-administered encounter medications on file as of 11/27/2021.    Review of Systems  Constitutional:  Negative for appetite change, chills, fatigue and fever.  HENT:  Negative for congestion, hearing loss, rhinorrhea and sore throat.   Eyes: Negative.   Respiratory:  Negative for cough, shortness of breath and wheezing.   Cardiovascular:  Negative for chest pain, palpitations and leg swelling.  Gastrointestinal:  Negative for abdominal pain, constipation, diarrhea, nausea and vomiting.  Genitourinary:  Negative for dysuria.  Musculoskeletal:  Negative for arthralgias, back pain and myalgias.  Skin:  Negative for color change, rash and wound.  Neurological:  Negative for dizziness and headaches.  Psychiatric/Behavioral:  Negative for behavioral problems. The patient is not nervous/anxious.       Immunization History  Administered  Date(s) Administered   Fluad Quad(high Dose 65+) 07/09/2021   Moderna Sars-Covid-2 Vaccination 11/16/2019, 12/10/2019   Tdap 07/09/2021   Zoster Recombinat (Shingrix) 07/09/2021   Pertinent  Health Maintenance Due  Topic Date Due   OPHTHALMOLOGY EXAM  Never done   HEMOGLOBIN A1C  05/06/2022   FOOT EXAM  08/04/2022   URINE MICROALBUMIN  11/20/2022   MAMMOGRAM  10/15/2023   COLONOSCOPY (Pts 45-69yrs Insurance coverage will need to be confirmed)  02/11/2025   INFLUENZA VACCINE  Completed   DEXA SCAN  Completed   Fall Risk 11/08/2021 11/08/2021 11/09/2021 11/10/2021 11/28/2021  Falls in the past year? - - - - -  Number of falls in past year - - - - -  Was there an injury with Fall? - - - - -  Fall Risk Category Calculator - - - - -  Fall Risk Category - - - - -  Patient Fall Risk Level High fall risk High fall risk High fall risk High fall risk High fall risk  Patient at Risk for Falls Due to - - - - -  Fall risk Follow up - - - - -     Vitals:   11/27/21 1145  BP: 119/71  Pulse: (!) 102  Resp: 19  Temp: (!) 97.5 F (36.4 C)  SpO2: 98%  Weight: 135 lb 12.8 oz (61.6 kg)  Height: 5\' 4"  (1.626 m)   Body mass index is 23.31 kg/m.  Physical Exam HENT:     Head: Normocephalic and atraumatic.     Nose: Nose normal.     Mouth/Throat:     Mouth: Mucous membranes are moist.  Eyes:     Conjunctiva/sclera: Conjunctivae normal.  Cardiovascular:     Rate and Rhythm: Tachycardia present. Rhythm irregular.  Pulmonary:     Effort: Pulmonary effort is normal.     Breath sounds: Normal breath sounds.  Abdominal:     General: Bowel sounds are normal.     Palpations: Abdomen is soft.  Skin:    General: Skin is warm and dry.  Neurological:     Mental Status: She is alert.     Comments: Alert to self, disoriented to time and place.  Left-sided weakness.  Psychiatric:        Mood and Affect: Mood normal.       Labs reviewed: Recent Labs    12/11/20 0643 12/12/20 0416  09/02/21 2018 11/04/21 1959 11/04/21 2006 11/05/21 0425 11/06/21 0319  NA 141 140   < > 136 139 133* 136  K 3.6 3.8   < > 4.6 4.5 4.6 4.1  CL  102 102   < > 99  --  96* 97*  CO2 31 29   < > 28  --  26 31  GLUCOSE 74 119*   < > 164*  --  210* 172*  BUN 30* 34*   < > 63*  --  54* 44*  CREATININE 0.74 0.75   < > 1.25*  --  1.05* 0.99  CALCIUM 9.5 9.6   < > 10.1  --  9.1 10.3  MG 1.7 1.5*  --   --   --   --  1.7  PHOS 2.9 2.9  --   --   --   --  2.6   < > = values in this interval not displayed.   Recent Labs    09/03/21 0357 11/04/21 1959 11/05/21 0425  AST 15 42* 44*  ALT 16 25 27   ALKPHOS 52 70 61  BILITOT 0.7 0.7 1.1  PROT 6.4* 6.2* 5.5*  ALBUMIN 3.6 2.4* 2.2*   Recent Labs    09/02/21 2018 09/02/21 2024 11/04/21 1959 11/04/21 2006 11/05/21 0425 11/06/21 0319  WBC 9.4   < > 7.5  --  7.6 7.1  NEUTROABS 5.9  --  5.0  --   --  3.6  HGB 13.8   < > 12.6 11.6* 10.7* 11.3*  HCT 39.9   < > 38.1 34.0* 32.3* 33.3*  MCV 91.5   < > 95.3  --  96.1 92.8  PLT 201   < > 290  --  272 264   < > = values in this interval not displayed.   Lab Results  Component Value Date   TSH 0.621 11/05/2021   Lab Results  Component Value Date   HGBA1C 7.8 (H) 11/05/2021   Lab Results  Component Value Date   CHOL 275 (H) 09/03/2021   HDL 39 (L) 09/03/2021   LDLCALC 202 (H) 09/03/2021   TRIG 169 (H) 09/03/2021   CHOLHDL 7.1 09/03/2021    Significant Diagnostic Results in last 30 days:  DG Chest 1 View  Result Date: 11/04/2021 CLINICAL DATA:  Altered mental status. EXAM: CHEST  1 VIEW COMPARISON:  September 02, 2021 FINDINGS: Mild to moderate severity infiltrate is seen within the left lung base. There is no evidence of a pleural effusion or pneumothorax. There is moderate to marked severity enlargement of the cardiac silhouette. This is increased in size when compared to the prior study, which may be technical in nature. Moderate to marked severity calcification of the aortic arch is  noted. The visualized skeletal structures are unremarkable. IMPRESSION: Moderate to marked severity cardiomegaly with mild to moderate severity left basilar infiltrate. Electronically Signed   By: Virgina Norfolk M.D.   On: 11/04/2021 20:12   CT Head Wo Contrast  Result Date: 11/28/2021 CLINICAL DATA:  Trauma. EXAM: CT HEAD WITHOUT CONTRAST CT CERVICAL SPINE WITHOUT CONTRAST TECHNIQUE: Multidetector CT imaging of the head and cervical spine was performed following the standard protocol without intravenous contrast. Multiplanar CT image reconstructions of the cervical spine were also generated. RADIATION DOSE REDUCTION: This exam was performed according to the departmental dose-optimization program which includes automated exposure control, adjustment of the mA and/or kV according to patient size and/or use of iterative reconstruction technique. COMPARISON:  Head CT dated 11/04/2021. FINDINGS: CT HEAD FINDINGS Brain: Mild age-related atrophy and chronic microvascular ischemic changes. Small old right internal capsule lacunar infarct. There is no acute intracranial hemorrhage. No mass effect or midline shift. No extra-axial fluid  collection. Vascular: No hyperdense vessel or unexpected calcification. Skull: Normal. Negative for fracture or focal lesion. Sinuses/Orbits: No acute finding. Other: None CT CERVICAL SPINE FINDINGS Alignment: No acute subluxation. There is mild reversal of normal cervical lordosis which may be positional or due to muscle spasm. Skull base and vertebrae: No acute fracture Soft tissues and spinal canal: No prevertebral fluid or swelling. No visible canal hematoma. Disc levels:  No acute findings.  Multilevel degenerative changes. Upper chest: Negative. Other: Bilateral carotid bulb calcified plaques. IMPRESSION: 1. No acute intracranial pathology. Mild age-related atrophy and chronic microvascular ischemic changes. 2. No acute cervical spine fracture or subluxation. Electronically Signed    By: Anner Crete M.D.   On: 11/28/2021 21:45   CT HEAD WO CONTRAST (5MM)  Result Date: 11/04/2021 CLINICAL DATA:  Altered level of consciousness for 2 days EXAM: CT HEAD WITHOUT CONTRAST TECHNIQUE: Contiguous axial images were obtained from the base of the skull through the vertex without intravenous contrast. COMPARISON:  09/03/2021, 09/02/2021 FINDINGS: Brain: Chronic ischemic changes are seen within the bilateral basal ganglia, right greater than left. No evidence of acute infarct or hemorrhage. Lateral ventricles and remaining midline structures are unremarkable. No acute extra-axial fluid collections. No mass effect. Vascular: No hyperdense vessel or unexpected calcification. Skull: Normal. Negative for fracture or focal lesion. Sinuses/Orbits: No acute finding. Other: None. IMPRESSION: 1. Chronic small vessel ischemic changes within the basal ganglia. No acute intracranial process. Electronically Signed   By: Randa Ngo M.D.   On: 11/04/2021 19:47   CT Angio Chest PE W and/or Wo Contrast  Result Date: 11/04/2021 CLINICAL DATA:  Hypoxia, abdominal pain EXAM: CT ANGIOGRAPHY CHEST CT ABDOMEN AND PELVIS WITH CONTRAST TECHNIQUE: Multidetector CT imaging of the chest was performed using the standard protocol during bolus administration of intravenous contrast. Multiplanar CT image reconstructions and MIPs were obtained to evaluate the vascular anatomy. Multidetector CT imaging of the abdomen and pelvis was performed using the standard protocol during bolus administration of intravenous contrast. CONTRAST:  179mL OMNIPAQUE IOHEXOL 350 MG/ML SOLN COMPARISON:  11/04/2021, 03/18/2020 FINDINGS: CTA CHEST FINDINGS Cardiovascular: This is a technically adequate evaluation of the pulmonary vasculature. No filling defects or pulmonary emboli. There is a large pericardial effusion measuring up to 2.8 cm in thickness. There is subtle pericardial enhancement, concerning for exudative effusion. No evidence of  thoracic aortic aneurysm or dissection. Moderate atherosclerosis of the aorta and coronary vasculature. Mediastinum/Nodes: No enlarged mediastinal, hilar, or axillary lymph nodes. Thyroid gland, trachea, and esophagus demonstrate no significant findings. Lungs/Pleura: Small left pleural effusion with left lower lobe compressive atelectasis. Mild bilateral perihilar interstitial and ground-glass opacities consistent with mild edema. No pneumothorax. Central airways are patent. Musculoskeletal: No acute or destructive bony lesions. Reconstructed images demonstrate no additional findings. Review of the MIP images confirms the above findings. CT ABDOMEN and PELVIS FINDINGS Hepatobiliary: Reflux of contrast into the hepatic veins suggests cardiac dysfunction. No focal liver abnormality. Gallbladder is surgically absent. Pancreas: Unremarkable. No pancreatic ductal dilatation or surrounding inflammatory changes. Spleen: Normal in size without focal abnormality. Adrenals/Urinary Tract: Adrenal glands are unremarkable. Kidneys are normal, without renal calculi, focal lesion, or hydronephrosis. Bladder is unremarkable. Stomach/Bowel: No bowel obstruction or ileus. Normal appendix right lower quadrant. Mild wall thickening involving the distal transverse and descending colon is nonspecific given decompressed state, and underlying colitis cannot be excluded. No associated inflammatory change. Vascular/Lymphatic: Significant atherosclerosis of the aorta, with focal high-grade stenosis of the distal aorta just proximal to the bifurcation. Please correlate  with any symptoms of claudication. No pathologic adenopathy within the abdomen or pelvis. Reproductive: Uterus and bilateral adnexa are unremarkable. Other: No free fluid or free gas.  No abdominal wall hernia. Musculoskeletal: No acute or destructive bony lesions. Prominent spondylosis at L5-S1. Reconstructed images demonstrate no additional findings. Review of the MIP images  confirms the above findings. IMPRESSION: 1. Large pericardial effusion, with pericardial enhancement concerning for pericarditis and exudative effusion. 2. Mild perihilar edema, small left pleural effusion, and reflux of contrast into the hepatic veins may reflect an element of constrictive pericarditis. Correlation with echocardiography is recommended. 3. No evidence of pulmonary embolus. 4. Nonspecific segmental distal colonic wall thickening. While this could be due to decompressed state, mild colitis cannot be excluded. 5. Aortic Atherosclerosis (ICD10-I70.0). High-grade stenosis at the aortic bifurcation from heavily calcified atheromatous plaque unchanged. Please correlate with any symptoms of lower extremity claudication. Electronically Signed   By: Randa Ngo M.D.   On: 11/04/2021 23:15   CT Cervical Spine Wo Contrast  Result Date: 11/28/2021 CLINICAL DATA:  Trauma. EXAM: CT HEAD WITHOUT CONTRAST CT CERVICAL SPINE WITHOUT CONTRAST TECHNIQUE: Multidetector CT imaging of the head and cervical spine was performed following the standard protocol without intravenous contrast. Multiplanar CT image reconstructions of the cervical spine were also generated. RADIATION DOSE REDUCTION: This exam was performed according to the departmental dose-optimization program which includes automated exposure control, adjustment of the mA and/or kV according to patient size and/or use of iterative reconstruction technique. COMPARISON:  Head CT dated 11/04/2021. FINDINGS: CT HEAD FINDINGS Brain: Mild age-related atrophy and chronic microvascular ischemic changes. Small old right internal capsule lacunar infarct. There is no acute intracranial hemorrhage. No mass effect or midline shift. No extra-axial fluid collection. Vascular: No hyperdense vessel or unexpected calcification. Skull: Normal. Negative for fracture or focal lesion. Sinuses/Orbits: No acute finding. Other: None CT CERVICAL SPINE FINDINGS Alignment: No acute  subluxation. There is mild reversal of normal cervical lordosis which may be positional or due to muscle spasm. Skull base and vertebrae: No acute fracture Soft tissues and spinal canal: No prevertebral fluid or swelling. No visible canal hematoma. Disc levels:  No acute findings.  Multilevel degenerative changes. Upper chest: Negative. Other: Bilateral carotid bulb calcified plaques. IMPRESSION: 1. No acute intracranial pathology. Mild age-related atrophy and chronic microvascular ischemic changes. 2. No acute cervical spine fracture or subluxation. Electronically Signed   By: Anner Crete M.D.   On: 11/28/2021 21:45   MR BRAIN WO CONTRAST  Result Date: 11/05/2021 CLINICAL DATA:  Altered mental status, recent history of multiple territory strokes EXAM: MRI HEAD WITHOUT CONTRAST TECHNIQUE: Multiplanar, multiecho pulse sequences of the brain and surrounding structures were obtained without intravenous contrast. COMPARISON:  CT head dated 1 day prior, MR head 09/03/2021 FINDINGS: Brain: There is faintly elevated DWI signal in the posterior limb of the right internal capsule and lentiform nucleus without convincing low ADC signal. There is associated FLAIR signal abnormality. This signal abnormality was present on the brain MRI from 09/03/2021 and likely reflects late subacute infarct. Curvilinear SWI signal dropout in this region likely reflects petechial blood products. There is an additional faint focus of diffusion restriction in the posterior limb of the left internal capsule with mildly low ADC signal and FLAIR signal abnormality which was not present on the prior brain MRI, likely reflecting more recent early subacute stroke. There is no evidence of associated hemorrhage. There is no other evidence of acute infarct. There is no evidence of acute intracranial hemorrhage  or extra-axial fluid collection. There is unchanged mild global parenchymal volume loss with prominence of the ventricular system and  extra-axial CSF spaces. Additional foci of FLAIR signal abnormality in the subcortical and periventricular white matter likely reflect sequela of chronic white matter microangiopathy. A few additional remote lacunar infarcts are unchanged. There is no solid mass lesion.  There is no midline shift. Vascular: Normal flow voids. Skull and upper cervical spine: Normal marrow signal. Sinuses/Orbits: The paranasal sinuses are clear. The globes and orbits are unremarkable. Other: There are bilateral mastoid effusions, unchanged. IMPRESSION: 1. Small infarct in the left internal capsule is new since the prior brain MRI of 09/03/2021, likely subacute in chronicity. 2. Evolving late subacute to early chronic infarcts in the right internal capsule/basal ganglia. 3. No other acute infarct or other acute intracranial pathology. Electronically Signed   By: Valetta Mole M.D.   On: 11/05/2021 12:27   CT ABDOMEN PELVIS W CONTRAST  Result Date: 11/04/2021 CLINICAL DATA:  Hypoxia, abdominal pain EXAM: CT ANGIOGRAPHY CHEST CT ABDOMEN AND PELVIS WITH CONTRAST TECHNIQUE: Multidetector CT imaging of the chest was performed using the standard protocol during bolus administration of intravenous contrast. Multiplanar CT image reconstructions and MIPs were obtained to evaluate the vascular anatomy. Multidetector CT imaging of the abdomen and pelvis was performed using the standard protocol during bolus administration of intravenous contrast. CONTRAST:  162mL OMNIPAQUE IOHEXOL 350 MG/ML SOLN COMPARISON:  11/04/2021, 03/18/2020 FINDINGS: CTA CHEST FINDINGS Cardiovascular: This is a technically adequate evaluation of the pulmonary vasculature. No filling defects or pulmonary emboli. There is a large pericardial effusion measuring up to 2.8 cm in thickness. There is subtle pericardial enhancement, concerning for exudative effusion. No evidence of thoracic aortic aneurysm or dissection. Moderate atherosclerosis of the aorta and coronary  vasculature. Mediastinum/Nodes: No enlarged mediastinal, hilar, or axillary lymph nodes. Thyroid gland, trachea, and esophagus demonstrate no significant findings. Lungs/Pleura: Small left pleural effusion with left lower lobe compressive atelectasis. Mild bilateral perihilar interstitial and ground-glass opacities consistent with mild edema. No pneumothorax. Central airways are patent. Musculoskeletal: No acute or destructive bony lesions. Reconstructed images demonstrate no additional findings. Review of the MIP images confirms the above findings. CT ABDOMEN and PELVIS FINDINGS Hepatobiliary: Reflux of contrast into the hepatic veins suggests cardiac dysfunction. No focal liver abnormality. Gallbladder is surgically absent. Pancreas: Unremarkable. No pancreatic ductal dilatation or surrounding inflammatory changes. Spleen: Normal in size without focal abnormality. Adrenals/Urinary Tract: Adrenal glands are unremarkable. Kidneys are normal, without renal calculi, focal lesion, or hydronephrosis. Bladder is unremarkable. Stomach/Bowel: No bowel obstruction or ileus. Normal appendix right lower quadrant. Mild wall thickening involving the distal transverse and descending colon is nonspecific given decompressed state, and underlying colitis cannot be excluded. No associated inflammatory change. Vascular/Lymphatic: Significant atherosclerosis of the aorta, with focal high-grade stenosis of the distal aorta just proximal to the bifurcation. Please correlate with any symptoms of claudication. No pathologic adenopathy within the abdomen or pelvis. Reproductive: Uterus and bilateral adnexa are unremarkable. Other: No free fluid or free gas.  No abdominal wall hernia. Musculoskeletal: No acute or destructive bony lesions. Prominent spondylosis at L5-S1. Reconstructed images demonstrate no additional findings. Review of the MIP images confirms the above findings. IMPRESSION: 1. Large pericardial effusion, with pericardial  enhancement concerning for pericarditis and exudative effusion. 2. Mild perihilar edema, small left pleural effusion, and reflux of contrast into the hepatic veins may reflect an element of constrictive pericarditis. Correlation with echocardiography is recommended. 3. No evidence of pulmonary embolus. 4. Nonspecific  segmental distal colonic wall thickening. While this could be due to decompressed state, mild colitis cannot be excluded. 5. Aortic Atherosclerosis (ICD10-I70.0). High-grade stenosis at the aortic bifurcation from heavily calcified atheromatous plaque unchanged. Please correlate with any symptoms of lower extremity claudication. Electronically Signed   By: Randa Ngo M.D.   On: 11/04/2021 23:15   CARDIAC CATHETERIZATION  Result Date: 11/10/2021   Successful pericardiocentesis removing 420 cc of bloody fluid. Await cytology results and other laboratory analysis of the pericardial fluid. Consider removing pericardial drain tomorrow depending on the output. Results conveyed to son, Charlotte Crumb.   ECHOCARDIOGRAM COMPLETE  Result Date: 11/05/2021    ECHOCARDIOGRAM REPORT   Patient Name:   Claudia Keller Date of Exam: 11/05/2021 Medical Rec #:  [id number]      Height:       63.5 in Accession #:    [id number]     Weight:       135.0 lb Date of Birth:  [birth year]      BSA:          1.646 m Patient Age:    20 years       BP:           160/88 mmHg Patient Gender: F              HR:           94 bpm. Exam Location:  Inpatient Procedure: 2D Echo, Cardiac Doppler and Color Doppler Indications:    Stroke  History:        Patient has prior history of Echocardiogram examinations, most                 recent 09/03/2021. Stroke; Risk Factors:Former Smoker,                 Hypertension and Diabetes.  Sonographer:    Merrie Roof RDCS Referring Phys: [id number]  1. Left ventricular ejection fraction, by estimation, is 60 to 65%. The left ventricle has normal function. The left ventricle has  no regional wall motion abnormalities. Left ventricular diastolic parameters are consistent with Grade I diastolic dysfunction (impaired relaxation).  2. Right ventricular systolic function is normal. The right ventricular size is normal. There is normal pulmonary artery systolic pressure.  3. Large circumferential pericardial effusion. Increased in since since 08/2021. Large pericardial effusion. The pericardial effusion is circumferential. There is no evidence of cardiac tamponade.  4. The mitral valve is normal in structure. No evidence of mitral valve regurgitation. No evidence of mitral stenosis.  5. The aortic valve is tricuspid. Aortic valve regurgitation is not visualized. No aortic stenosis is present.  6. The inferior vena cava is normal in size with greater than 50% respiratory variability, suggesting right atrial pressure of 3 mmHg. FINDINGS  Left Ventricle: Left ventricular ejection fraction, by estimation, is 60 to 65%. The left ventricle has normal function. The left ventricle has no regional wall motion abnormalities. The left ventricular internal cavity size was normal in size. There is  no left ventricular hypertrophy. Left ventricular diastolic parameters are consistent with Grade I diastolic dysfunction (impaired relaxation). Right Ventricle: The right ventricular size is normal. No increase in right ventricular wall thickness. Right ventricular systolic function is normal. There is normal pulmonary artery systolic pressure. The tricuspid regurgitant velocity is 1.62 m/s, and  with an assumed right atrial pressure of 3 mmHg, the estimated right ventricular systolic pressure is 30.8 mmHg. Left Atrium: Left atrial size was  normal in size. Right Atrium: Right atrial size was normal in size. Pericardium: Large circumferential pericardial effusion. Increased in since since 08/2021. A large pericardial effusion is present. The pericardial effusion is circumferential. There is diastolic collapse of the  right ventricular free wall. There is no evidence of cardiac tamponade. Presence of epicardial fat layer. Mitral Valve: The mitral valve is normal in structure. No evidence of mitral valve regurgitation. No evidence of mitral valve stenosis. Tricuspid Valve: The tricuspid valve is normal in structure. Tricuspid valve regurgitation is trivial. No evidence of tricuspid stenosis. Aortic Valve: The aortic valve is tricuspid. Aortic valve regurgitation is not visualized. No aortic stenosis is present. Aortic valve mean gradient measures 4.0 mmHg. Aortic valve peak gradient measures 6.9 mmHg. Aortic valve area, by VTI measures 2.67 cm. Pulmonic Valve: The pulmonic valve was normal in structure. Pulmonic valve regurgitation is not visualized. No evidence of pulmonic stenosis. Aorta: The aortic root is normal in size and structure. Venous: The inferior vena cava is normal in size with greater than 50% respiratory variability, suggesting right atrial pressure of 3 mmHg. IAS/Shunts: No atrial level shunt detected by color flow Doppler.  LEFT VENTRICLE PLAX 2D LVIDd:         3.60 cm   Diastology LVIDs:         2.40 cm   LV e' medial:    6.76 cm/s LV PW:         0.90 cm   LV E/e' medial:  10.7 LV IVS:        1.10 cm   LV e' lateral:   7.42 cm/s LVOT diam:     1.80 cm   LV E/e' lateral: 9.8 LV SV:         51 LV SV Index:   31 LVOT Area:     2.54 cm  RIGHT VENTRICLE          IVC RV Basal diam:  3.00 cm  IVC diam: 1.50 cm LEFT ATRIUM             Index        RIGHT ATRIUM           Index LA diam:        3.20 cm 1.94 cm/m   RA Area:     16.50 cm LA Vol (A2C):   41.5 ml 25.22 ml/m  RA Volume:   46.40 ml  28.19 ml/m LA Vol (A4C):   43.9 ml 26.67 ml/m LA Biplane Vol: 44.1 ml 26.80 ml/m  AORTIC VALVE AV Area (Vmax):    2.39 cm AV Area (Vmean):   2.31 cm AV Area (VTI):     2.67 cm AV Vmax:           131.00 cm/s AV Vmean:          90.100 cm/s AV VTI:            0.190 m AV Peak Grad:      6.9 mmHg AV Mean Grad:      4.0 mmHg  LVOT Vmax:         123.00 cm/s LVOT Vmean:        81.700 cm/s LVOT VTI:          0.199 m LVOT/AV VTI ratio: 1.05  AORTA Ao Root diam: 2.80 cm Ao Asc diam:  2.90 cm MITRAL VALVE                TRICUSPID VALVE MV Area (PHT): 4.57 cm  TR Peak grad:   10.5 mmHg MV Decel Time: 166 msec     TR Vmax:        162.00 cm/s MV E velocity: 72.60 cm/s MV A velocity: 117.00 cm/s  SHUNTS MV E/A ratio:  0.62         Systemic VTI:  0.20 m                             Systemic Diam: 1.80 cm Skeet Latch MD Electronically signed by Skeet Latch MD Signature Date/Time: 11/05/2021/4:34:25 PM    Final    ECHOCARDIOGRAM LIMITED  Result Date: 11/19/2021    ECHOCARDIOGRAM LIMITED REPORT   Patient Name:   Claudia Keller Date of Exam: 11/19/2021 Medical Rec #:  [id number]      Height:       63.0 in Accession #:    [id number]     Weight:       139.8 lb Date of Birth:  07-25-1951      BSA:          1.661 m Patient Age:    13 years       BP:           160/80 mmHg Patient Gender: F              HR:           95 bpm. Exam Location:  [address] Procedure: Limited Echo, Cardiac Doppler and Limited Color Doppler Indications:    I31.39 Pericardial effusion. LIMITED follow up pericardial                 effusion.  History:        Patient has prior history of Echocardiogram examinations, most                 recent 11/09/2021. Pericardial effusion. Pericarditis, Stroke and                 COPD, Arrythmias:Atrial Fibrillation; Risk Factors:Diabetes,                 Hypertension, Dyslipidemia, Current Smoker and Obesity.                 COVID-19. CKD stage 3.  Sonographer:    Basilia Jumbo BS, RDCS Referring Phys: [place]  1. Left ventricular ejection fraction, by estimation, is 65 to 70%. The left ventricle has normal function. Comparison(s): 11/09/21 EF 70-75%. Small pericardial effusion. FINDINGS  Left Ventricle: Left ventricular ejection fraction, by estimation, is 65 to 70%. The left ventricle has normal function.  The left ventricular internal cavity size was normal in size. Pericardium: Trivial pericardial effusion is present. Aorta: The aortic root and ascending aorta are structurally normal, with no evidence of dilitation. LEFT VENTRICLE PLAX 2D LVIDd:         3.60 cm Diastology LVIDs:         2.10 cm LV e' medial:    8.27 cm/s LV PW:         0.80 cm LV E/e' medial:  9.7 LV IVS:        0.60 cm LV e' lateral:   8.27 cm/s                        LV E/e' lateral: 9.7  IVC IVC diam: 1.10 cm LEFT ATRIUM  Index       RIGHT ATRIUM LA diam:    2.80 cm 1.69 cm/m  RA Pressure: 3.00 mmHg   AORTA Ao Root diam: 2.90 cm MITRAL VALVE                TRICUSPID VALVE                             Estimated RAP:  3.00 mmHg MV Decel Time: 165 msec MV E velocity: 80.20 cm/s MV A velocity: 112.00 cm/s MV E/A ratio:  0.72 Mertie Moores MD Electronically signed by Mertie Moores MD Signature Date/Time: 11/19/2021/4:58:53 PM    Final    ECHOCARDIOGRAM LIMITED  Result Date: 11/09/2021    ECHOCARDIOGRAM LIMITED REPORT   Patient Name:   Claudia Keller Date of Exam: 11/09/2021 Medical Rec #:  [id number]      Height:       63.4 in Accession #:    [id number]     Weight:       145.5 lb Date of Birth:  11-Dec-1950      BSA:          1.697 m Patient Age:    37 years       BP:           116/56 mmHg Patient Gender: F              HR:           72 bpm. Exam Location:  Inpatient Procedure: Limited Echo and Cardiac Doppler Indications:    Pericardial Effusion I31.3  History:        Patient has prior history of Echocardiogram examinations, most                 recent 11/06/2021. COPD, Arrythmias:Atrial Fibrillation; Risk                 Factors:Hypertension, Diabetes and Dyslipidemia. AMS (altered                 mental status), Pericardial Disease, chronic kidney disease.  Sonographer:    Alvino Chapel RCS Referring Phys: [place]  1. Left ventricular ejection fraction, by estimation, is 70 to 75%. The left ventricle has hyperdynamic  function. The left ventricle has no regional wall motion abnormalities.  2. A small pericardial effusion is present. The pericardial effusion is circumferential.  3. Limited study to evaluate pericardial effusion FINDINGS  Left Ventricle: Left ventricular ejection fraction, by estimation, is 70 to 75%. The left ventricle has hyperdynamic function. The left ventricle has no regional wall motion abnormalities. Pericardium: A small pericardial effusion is present. The pericardial effusion is circumferential. Carlyle Dolly MD Electronically signed by Carlyle Dolly MD Signature Date/Time: 11/09/2021/6:25:11 PM    Final    ECHOCARDIOGRAM LIMITED  Result Date: 11/06/2021    ECHOCARDIOGRAM LIMITED REPORT   Patient Name:   Claudia Keller Date of Exam: 11/06/2021 Medical Rec #:  [id number]      Height:       63.5 in Accession #:    [id number]     Weight:       143.5 lb Date of Birth:  December 20, 1950      BSA:          1.689 m Patient Age:    51 years       BP:           159/68 mmHg Patient  Gender: F              HR:           88 bpm. Exam Location:  Inpatient Procedure: Limited Echo Indications:    pericardial tap  History:        Patient has prior history of Echocardiogram examinations, most                 recent 11/05/2021. Pericardial Disease, chronic kidney disease                 and COPD, Signs/Symptoms:Altered Mental Status; Risk                 Factors:Diabetes and Hypertension.  Sonographer:    Johny Chess RDCS Referring Phys: [place]  1. Large circumferential pericardial effusion up to 2.0 cm posterior to the LV. No RA/RV diastolic collapse. No dopper interrogation. Echo guided pericardiocentesis performed. Last image shows catheter posterior to LV, with improvement of effusion on final image to 0.5 cm. Large pericardial effusion. The pericardial effusion is circumferential.  2. Left ventricular ejection fraction, by estimation, is 60 to 65%. The left ventricle has normal  function. The left ventricle has no regional wall motion abnormalities.  3. Right ventricular systolic function is normal. The right ventricular size is normal. FINDINGS  Left Ventricle: Left ventricular ejection fraction, by estimation, is 60 to 65%. The left ventricle has normal function. The left ventricle has no regional wall motion abnormalities. Right Ventricle: The right ventricular size is normal. No increase in right ventricular wall thickness. Right ventricular systolic function is normal. Left Atrium: Left atrial size was normal in size. Right Atrium: Right atrial size was normal in size. Pericardium: Large circumferential pericardial effusion up to 2.0 cm posterior to the LV. No RA/RV diastolic collapse. No dopper interrogation. Echo guided pericardiocentesis performed. Last image shows catheter posterior to LV, with improvement of effusion on final image to 0.5 cm. A large pericardial effusion is present. The pericardial effusion is circumferential. Presence of epicardial fat layer. Eleonore Chiquito MD Electronically signed by Eleonore Chiquito MD Signature Date/Time: 11/06/2021/5:52:14 PM    Final     Assessment/Plan  1. Type 2 diabetes mellitus with stage 3a chronic kidney disease, with long-term current use of insulin (HCC) Lab Results  Component Value Date   HGBA1C 7.8 (H) 11/05/2021   -   Continue Levemir and CBG checks  2. Chronic diastolic heart failure (HCC)  No SOB, continue Lasix 20 mg 1 tab every other day  3. History of CVA (cerebrovascular accident) -   has left-sided weakness and currently having PT and OT -  continue ASA and Rosuvastatin     Family/ staff Communication: Discussed plan of care with resident and charge nurse.  Labs/tests ordered:  None    Durenda Age, DNP, MSN, FNP-BC [facility] [phone] (Monday-Friday 8:00 a.m. - 5:00 p.m.) [phone] (after hours)

## 2021-11-28 ENCOUNTER — Emergency Department (HOSPITAL_COMMUNITY): Payer: Medicare Other

## 2021-11-28 ENCOUNTER — Encounter (HOSPITAL_COMMUNITY): Payer: Self-pay

## 2021-11-28 ENCOUNTER — Other Ambulatory Visit: Payer: Self-pay

## 2021-11-28 ENCOUNTER — Emergency Department (HOSPITAL_COMMUNITY)
Admission: EM | Admit: 2021-11-28 | Discharge: 2021-11-29 | Disposition: A | Discharge status: Home / Self Care | Payer: Medicare Other | Attending: Emergency Medicine | Admitting: Emergency Medicine

## 2021-11-28 DIAGNOSIS — E119 Type 2 diabetes mellitus without complications: Secondary | ICD-10-CM | POA: Insufficient documentation

## 2021-11-28 DIAGNOSIS — I10 Essential (primary) hypertension: Secondary | ICD-10-CM | POA: Diagnosis not present

## 2021-11-28 DIAGNOSIS — Z794 Long term (current) use of insulin: Secondary | ICD-10-CM | POA: Insufficient documentation

## 2021-11-28 DIAGNOSIS — W19XXXA Unspecified fall, initial encounter: Secondary | ICD-10-CM | POA: Diagnosis not present

## 2021-11-28 DIAGNOSIS — Z79899 Other long term (current) drug therapy: Secondary | ICD-10-CM | POA: Insufficient documentation

## 2021-11-28 DIAGNOSIS — S0990XA Unspecified injury of head, initial encounter: Secondary | ICD-10-CM | POA: Insufficient documentation

## 2021-11-28 DIAGNOSIS — F32A Depression, unspecified: Secondary | ICD-10-CM | POA: Insufficient documentation

## 2021-11-28 DIAGNOSIS — Z7982 Long term (current) use of aspirin: Secondary | ICD-10-CM | POA: Diagnosis not present

## 2021-11-28 IMAGING — CT CT HEAD W/O CM
3 series · 15 of 47 positions shown, 18 images · non-contrast
Comparison: Head CT dated [DATE].

CLINICAL DATA: Trauma.



[Series 3: head 5.0 h30s · axial · 0.42mm/px · z∈[-59,+66]mm · 9 of 31 slices shown, 12 images]
[im 3/31  brain]
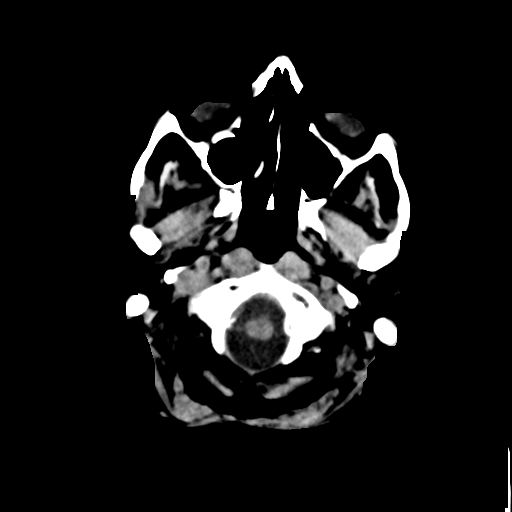
[im 3/31  bone]
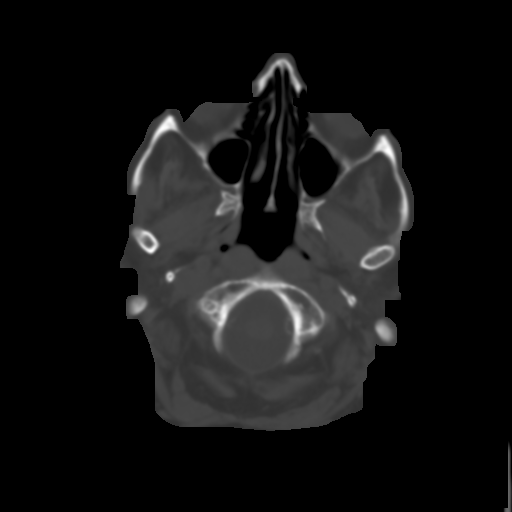
[im 6/31  brain]
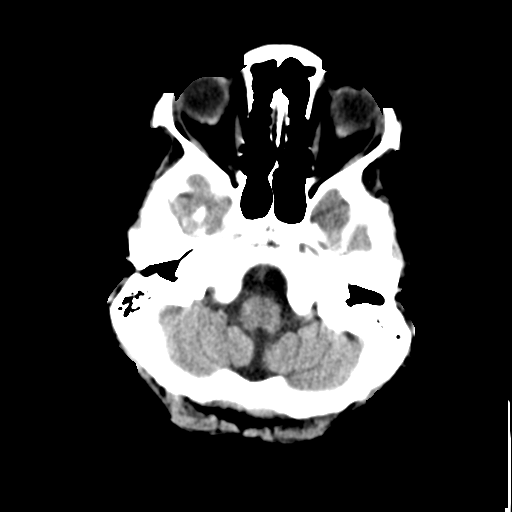
[im 9/31  brain]
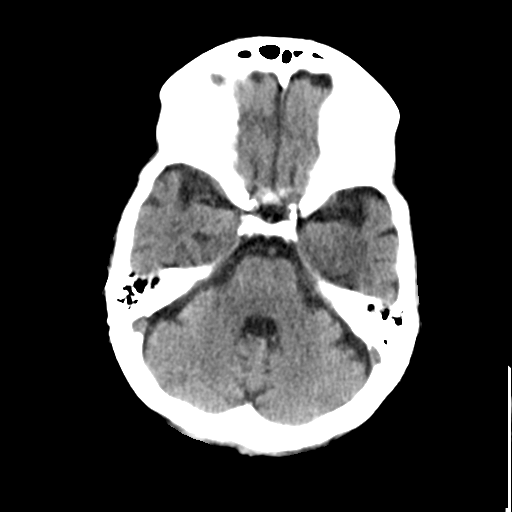
[im 12/31  brain]
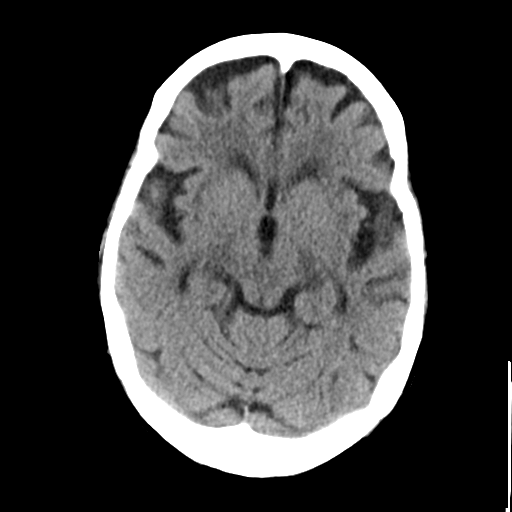
[im 16/31  brain]
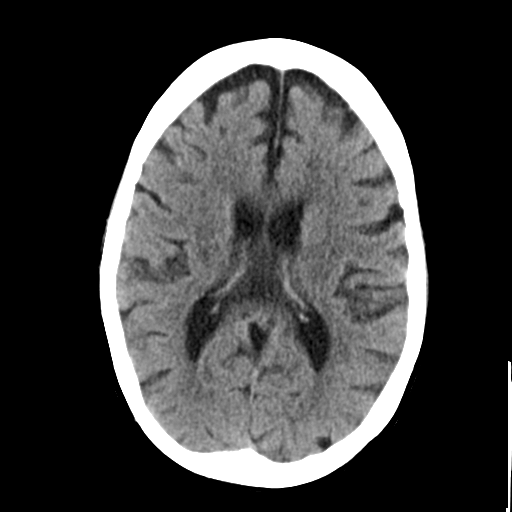
[im 16/31  bone]
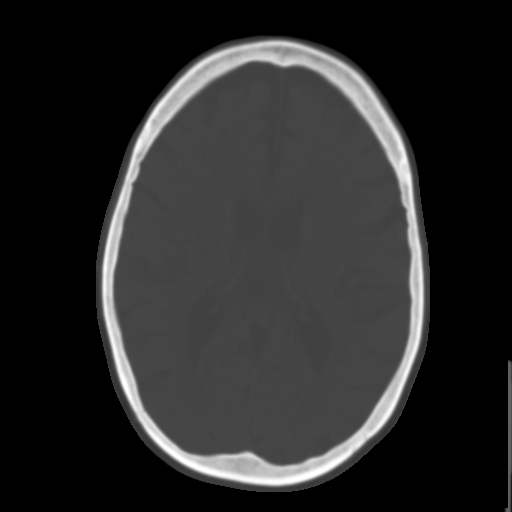
[im 19/31  brain]
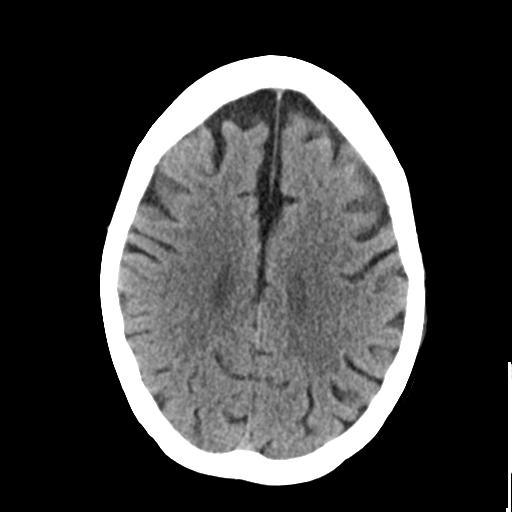
[im 22/31  brain]
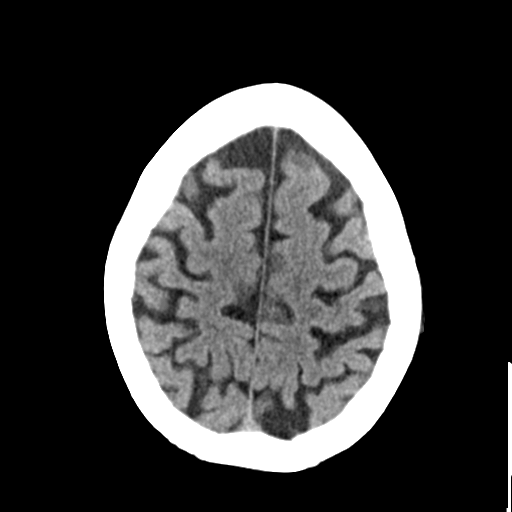
[im 25/31  brain]
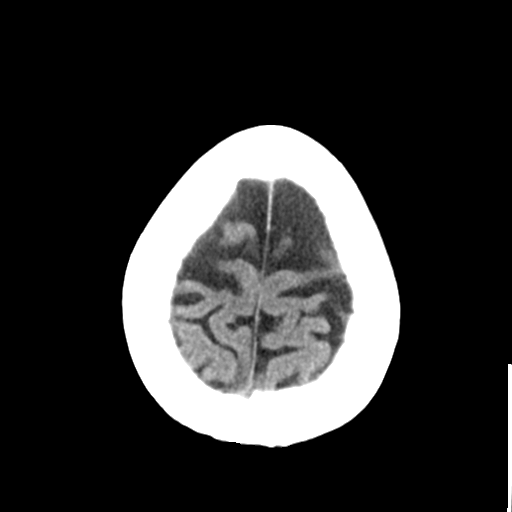
[im 28/31  brain]
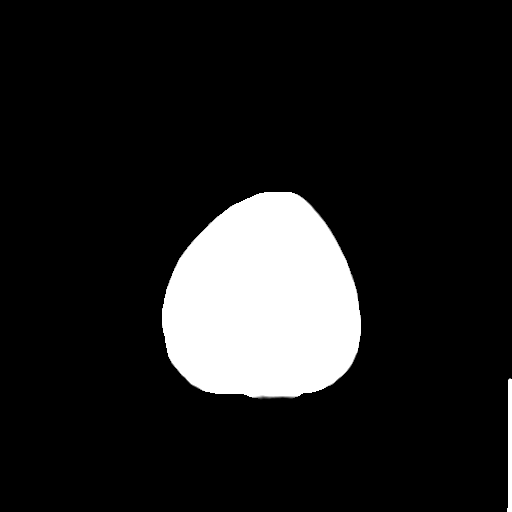
[im 28/31  bone]
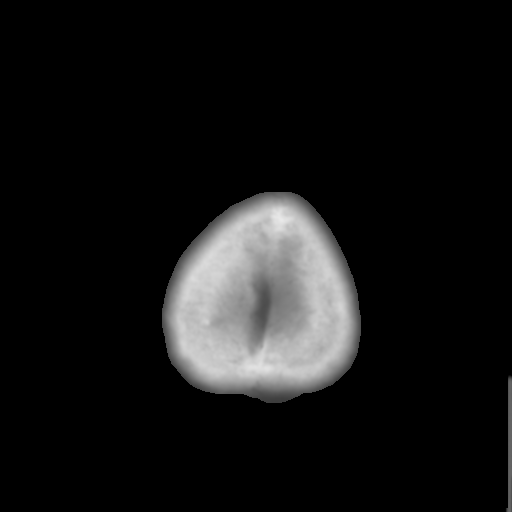

[Series 5: head 3.0 mpr cor · coronal · 0.30mm/px · 3 of 67 slices shown]
[im 23/67  brain]
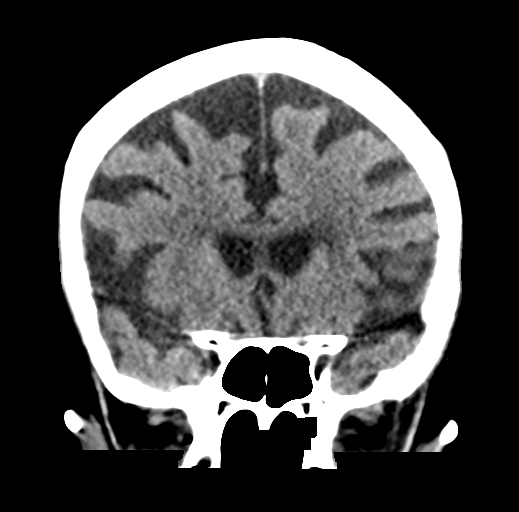
[im 30/67  brain]
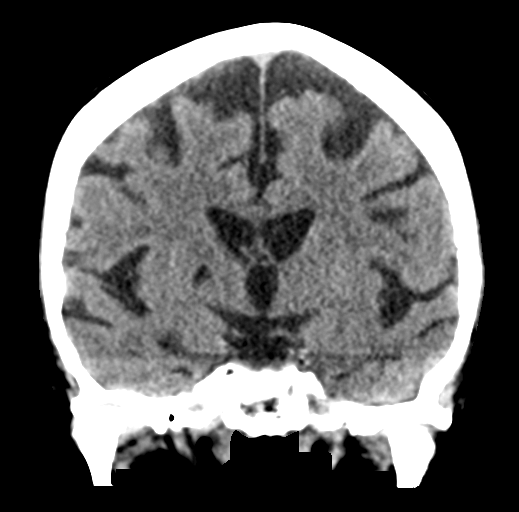
[im 37/67  brain]
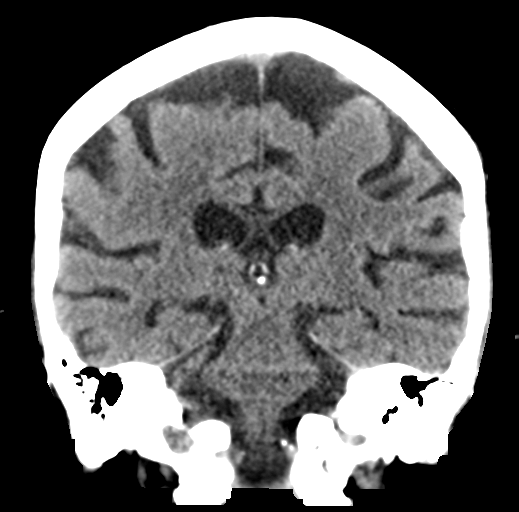

[Series 6: head 3.0 mpr sag · sagittal · 0.30mm/px · 3 of 54 slices shown]
[im 18/54  brain]
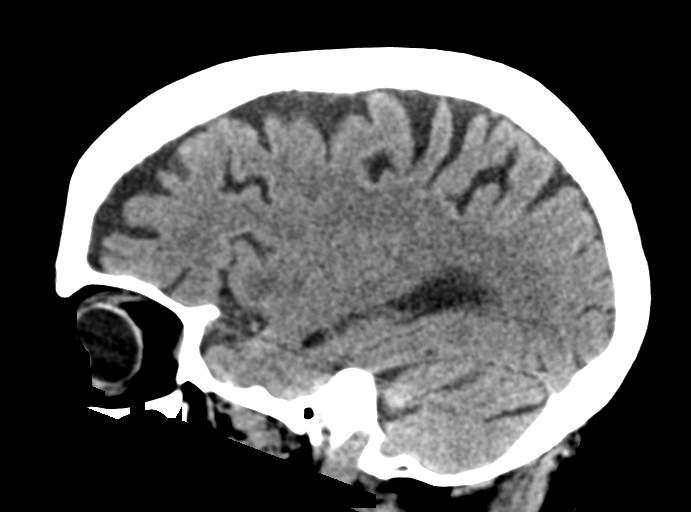
[im 27/54  brain]
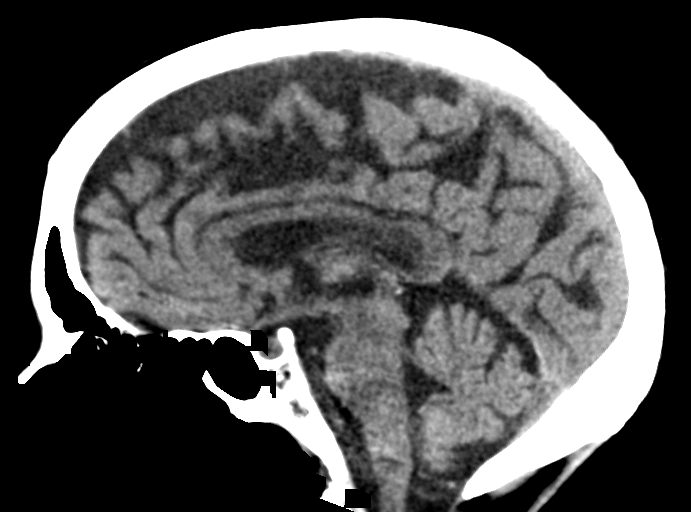
[im 36/54  brain]
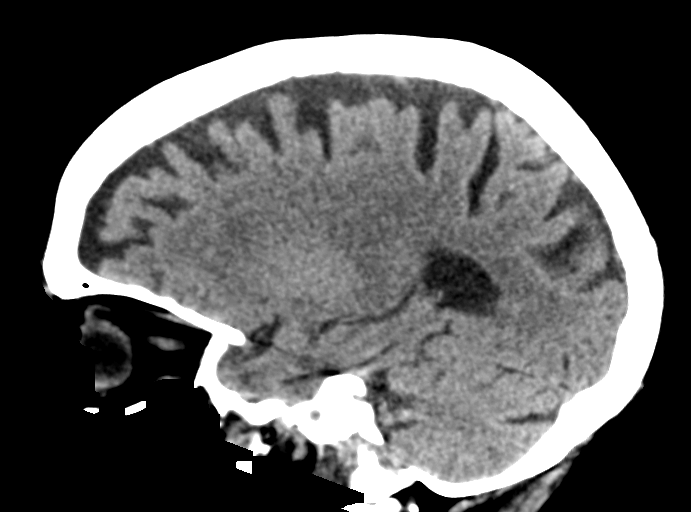

[15 of 47 positions shown; findings below may reference images not displayed]

FINDINGS: CT HEAD FINDINGS

Brain: Mild age-related atrophy and chronic microvascular ischemic
changes. Small old right internal capsule lacunar infarct. There is
no acute intracranial hemorrhage. No mass effect or midline shift.
No extra-axial fluid collection.

Vascular: No hyperdense vessel or unexpected calcification.

Skull: Normal. Negative for fracture or focal lesion.

Sinuses/Orbits: No acute finding.

Other: None

CT CERVICAL SPINE FINDINGS

Alignment: No acute subluxation. There is mild reversal of normal
cervical lordosis which may be positional or due to muscle spasm.

Skull base and vertebrae: No acute fracture

Soft tissues and spinal canal: No prevertebral fluid or swelling. No
visible canal hematoma.

Disc levels:  No acute findings.  Multilevel degenerative changes.

Upper chest: Negative.

Other: Bilateral carotid bulb calcified plaques.
IMPRESSION: 1. No acute intracranial pathology. Mild age-related atrophy and
chronic microvascular ischemic changes.
2. No acute cervical spine fracture or subluxation.

## 2021-11-28 NOTE — ED Triage Notes (Signed)
Per EMS pt had a fall today. Pt fell again today and knot on left side of head. Stopped eliquis on jan 5  Hx: Dementia   VS 138/78 95 HR  98 Sats

## 2021-11-28 NOTE — ED Provider Notes (Signed)
[facility] EMERGENCY DEPARTMENT Provider Note   CSN: [id number] Arrival date & time: 11/28/21  1923     History  Chief Complaint  Patient presents with   Ernestine Conrad is a 71 y.o. female.  Patient is a 71 year old female who presents with a fall.  She presents from [place].  Per chart review she has a history of hypertension, diabetes, hyperlipidemia, bipolar disorder.  Per EMS report, she had been having frequent falls.  She was on anticoagulation for atrial fibrillation but reportedly this was stopped on January 5.  She said that she has had 2 falls since yesterday.  When I asked her what is making her fall, she says it is because she is depressed.  She says when she is depressed she just falls down.  She denies any dizziness, chest pain, shortness of breath or other recent illnesses.  She denies any complaints of pain from the fall.  No neck or back pain.  No extremity pain.      Home Medications Prior to Admission medications   Medication Sig Start Date End Date Taking? Authorizing Provider  acetaminophen (TYLENOL) 325 MG tablet Take 650 mg by mouth every 5 (five) hours as needed for mild pain.    [provider]  albuterol (VENTOLIN HFA) 108 (90 Base) MCG/ACT inhaler Inhale 2 puffs into the lungs every 4 (four) hours as needed for wheezing or shortness of breath. 12/03/20   Manuella Ghazi, Pratik D, DO  allopurinol (ZYLOPRIM) 100 MG tablet Take 50 mg by mouth daily. Idiopathic gout    [provider]  aspirin 81 MG chewable tablet Chew 81 mg by mouth daily.    [provider]  bisacodyl (DULCOLAX) 10 MG suppository Place 10 mg rectally daily as needed for moderate constipation or severe constipation. Use only if Milk of Magnesia does not work as needed every 24 hours.    [provider]  Calcium Carb-Cholecalciferol (OYSTER SHELL CALCIUM W/D) 500-200 MG-UNIT TABS Take 1 tablet by mouth 2 (two) times daily.  11/10/20   [provider]  colchicine 0.6 MG tablet Take 1 tablet (0.6 mg total) by mouth daily. 11/11/21 01/08/22  Sharen Hones, MD  colestipol (COLESTID) 1 g tablet TAKE 2 TABLETS DAILY FOR DIARRHEA. DO NOT TAKE WITHIN 2 HOURS OF OTHER ORAL MEDICATIONS. HOLD FOR CONSTIPATION. 05/23/20   Carlis Stable, NP  cyanocobalamin ([employer] VITAMIN B12) 2000 MCG tablet Take 1 tablet (2,000 mcg total) by mouth daily. 09/04/21   Johnson, Clanford L, MD  diltiazem (CARDIZEM CD) 120 MG 24 hr capsule Take 1 capsule (120 mg total) by mouth daily. 12/12/20 12/12/21  Kathie Dike, MD  divalproex (DEPAKOTE ER) 500 MG 24 hr tablet Take 2 tablets (1,000 mg total) by mouth every evening. 02/20/21   Norman Clay, MD  divalproex (DEPAKOTE) 250 MG DR tablet Take 1 tablet (250 mg total) by mouth daily. 02/20/21   Norman Clay, MD  furosemide (LASIX) 20 MG tablet Take 1 tablet (20 mg total) by mouth every other day. 09/07/21   Johnson, Clanford L, MD  insulin detemir (LEVEMIR) 100 UNIT/ML injection Inject 0.12 mLs (12 Units total) into the skin 2 (two) times daily. 11/10/21   Sharen Hones, MD  levothyroxine (SYNTHROID) 200 MCG tablet Take 200 mcg by mouth daily before breakfast.    [provider]  magnesium hydroxide (MILK OF MAGNESIA) 400 MG/5ML suspension Take 30 mLs by mouth daily as needed for mild constipation or  moderate constipation. If no BM in 3 days, take 30cc every 24 hours PRN    [provider]  mirtazapine (REMERON) 15 MG tablet Take 15 mg by mouth at bedtime.    [provider]  NON FORMULARY Diet:Mech soft/thin diet consistency    [provider]  Nutritional Supplements (NUTRITIONAL SUPPLEMENT PO) Take by mouth. Magic Cup by mouth once Daily    [provider]  ondansetron (ZOFRAN) 4 MG tablet Take 1 tablet (4 mg total) by mouth every 8 (eight) hours as needed for nausea or vomiting. 10/17/18   Carlis Stable, NP  pantoprazole (PROTONIX) 40 MG tablet TAKE 1 TABLET BY MOUTH  ONCE DAILY 30 MINTUES BEFORE BREAKFAST. 05/23/20   Carlis Stable, NP  PARoxetine (PAXIL) 40 MG tablet Take 1 tablet (40 mg total) by mouth daily. 02/20/21   Norman Clay, MD  QUEtiapine (SEROQUEL) 100 MG tablet Take 1 tablet (100 mg total) by mouth at bedtime. 02/20/21   Norman Clay, MD  rosuvastatin (CRESTOR) 20 MG tablet Take 20 mg by mouth daily.    [provider]  senna-docusate (SENOKOT-S) 8.6-50 MG tablet Take 1 tablet by mouth at bedtime as needed for mild constipation. 09/04/21   Johnson, Clanford L, MD  Sodium Phosphates (RA SALINE ENEMA RE) Place 1 Dose rectally once as needed (severe constipation). If no relief from both milk of magnesia, and Bisacodyl suppository, administer one dose of disposable saline enema.    [provider]      Allergies    Patient has no known allergies.    Review of Systems   Review of Systems  Constitutional:  Negative for chills, diaphoresis, fatigue and fever.  HENT:  Negative for congestion, rhinorrhea and sneezing.   Eyes: Negative.   Respiratory:  Negative for cough, chest tightness and shortness of breath.   Cardiovascular:  Negative for chest pain and leg swelling.  Gastrointestinal:  Negative for abdominal pain, blood in stool, diarrhea, nausea and vomiting.  Genitourinary:  Negative for difficulty urinating, flank pain, frequency and hematuria.  Musculoskeletal:  Negative for arthralgias and back pain.  Skin:  Negative for rash.  Neurological:  Negative for dizziness, speech difficulty, weakness, numbness and headaches.  Psychiatric/Behavioral:  Positive for dysphoric mood.    Physical Exam Updated Vital Signs BP (!) 160/77    Pulse 91    Temp 97.9 F (36.6 C) (Oral)    Resp 16    SpO2 100%  Physical Exam Constitutional:      Appearance: She is well-developed.  HENT:     Head: Normocephalic and atraumatic.  Eyes:     Pupils: Pupils are equal, round, and reactive to light.  Cardiovascular:     Rate and Rhythm:  Normal rate and regular rhythm.     Heart sounds: Normal heart sounds.  Pulmonary:     Effort: Pulmonary effort is normal. No respiratory distress.     Breath sounds: Normal breath sounds. No wheezing or rales.  Chest:     Chest wall: No tenderness.  Abdominal:     General: Bowel sounds are normal.     Palpations: Abdomen is soft.     Tenderness: There is no abdominal tenderness. There is no guarding or rebound.  Musculoskeletal:        General: Normal range of motion.     Cervical back: Normal range of motion and neck supple.     Comments: No pain on palpation or range of motion of the extremities  Lymphadenopathy:     Cervical: No cervical adenopathy.  Skin:    General: Skin is warm and dry.     Findings: No rash.  Neurological:     Mental Status: She is alert and oriented to person, place, and time.    ED Results / Procedures / Treatments   Labs (all labs ordered are listed, but only abnormal results are displayed) Labs Reviewed - No data to display  EKG None  Radiology CT Head Wo Contrast  Result Date: 11/28/2021 CLINICAL DATA:  Trauma. EXAM: CT HEAD WITHOUT CONTRAST CT CERVICAL SPINE WITHOUT CONTRAST TECHNIQUE: Multidetector CT imaging of the head and cervical spine was performed following the standard protocol without intravenous contrast. Multiplanar CT image reconstructions of the cervical spine were also generated. RADIATION DOSE REDUCTION: This exam was performed according to the departmental dose-optimization program which includes automated exposure control, adjustment of the mA and/or kV according to patient size and/or use of iterative reconstruction technique. COMPARISON:  Head CT dated 11/04/2021. FINDINGS: CT HEAD FINDINGS Brain: Mild age-related atrophy and chronic microvascular ischemic changes. Small old right internal capsule lacunar infarct. There is no acute intracranial hemorrhage. No mass effect or midline shift. No extra-axial fluid collection. Vascular:  No hyperdense vessel or unexpected calcification. Skull: Normal. Negative for fracture or focal lesion. Sinuses/Orbits: No acute finding. Other: None CT CERVICAL SPINE FINDINGS Alignment: No acute subluxation. There is mild reversal of normal cervical lordosis which may be positional or due to muscle spasm. Skull base and vertebrae: No acute fracture Soft tissues and spinal canal: No prevertebral fluid or swelling. No visible canal hematoma. Disc levels:  No acute findings.  Multilevel degenerative changes. Upper chest: Negative. Other: Bilateral carotid bulb calcified plaques. IMPRESSION: 1. No acute intracranial pathology. Mild age-related atrophy and chronic microvascular ischemic changes. 2. No acute cervical spine fracture or subluxation. Electronically Signed   By: Anner Crete M.D.   On: 11/28/2021 21:45   CT Cervical Spine Wo Contrast  Result Date: 11/28/2021 CLINICAL DATA:  Trauma. EXAM: CT HEAD WITHOUT CONTRAST CT CERVICAL SPINE WITHOUT CONTRAST TECHNIQUE: Multidetector CT imaging of the head and cervical spine was performed following the standard protocol without intravenous contrast. Multiplanar CT image reconstructions of the cervical spine were also generated. RADIATION DOSE REDUCTION: This exam was performed according to the departmental dose-optimization program which includes automated exposure control, adjustment of the mA and/or kV according to patient size and/or use of iterative reconstruction technique. COMPARISON:  Head CT dated 11/04/2021. FINDINGS: CT HEAD FINDINGS Brain: Mild age-related atrophy and chronic microvascular ischemic changes. Small old right internal capsule lacunar infarct. There is no acute intracranial hemorrhage. No mass effect or midline shift. No extra-axial fluid collection. Vascular: No hyperdense vessel or unexpected calcification. Skull: Normal. Negative for fracture or focal lesion. Sinuses/Orbits: No acute finding. Other: None CT CERVICAL SPINE FINDINGS  Alignment: No acute subluxation. There is mild reversal of normal cervical lordosis which may be positional or due to muscle spasm. Skull base and vertebrae: No acute fracture Soft tissues and spinal canal: No prevertebral fluid or swelling. No visible canal hematoma. Disc levels:  No acute findings.  Multilevel degenerative changes. Upper chest: Negative. Other: Bilateral carotid bulb calcified plaques. IMPRESSION: 1. No acute intracranial pathology. Mild age-related atrophy and chronic microvascular ischemic changes. 2. No acute cervical spine fracture or subluxation. Electronically Signed   By: Anner Crete M.D.   On: 11/28/2021 21:45    Procedures Procedures    Medications Ordered in ED Medications - No  data to display  ED Course/ Medical Decision Making/ A&P                           Medical Decision Making Amount and/or Complexity of Data Reviewed Radiology: ordered.   Patient is a 71 year old female who presents after mechanical fall.  She did not have any symptoms preceding the fall other than depression.  She says she always falls when she gets depressed.  She was on Eliquis but this was stopped on January 5.  She had a head CT and a CT of her cervical spine tonight which showed no acute abnormalities.  These were also reviewed by me.  She does not seem to have any other medical complaints.  No other areas of injury.  No recent other medical symptoms or recent illnesses.  She was discharged back to the nursing facility.  It does not appear that this point she will need inpatient treatment.  Return precautions were given.  Final Clinical Impression(s) / ED Diagnoses Final diagnoses:  Fall, initial encounter  Injury of head, initial encounter    Rx / DC Orders ED Discharge Orders     None         Malvin Johns, MD 11/28/21 2210

## 2021-11-28 NOTE — ED Notes (Signed)
[facility] called to pick up patient

## 2021-11-28 NOTE — ED Notes (Signed)
Patient given warm blankets. Comfortable and delay explained. On purewick and brief is free of urine.

## 2021-12-01 ENCOUNTER — Ambulatory Visit: Payer: Medicare (Managed Care) | Admitting: Student

## 2021-12-02 ENCOUNTER — Emergency Department (HOSPITAL_COMMUNITY): Payer: Medicare Other

## 2021-12-02 ENCOUNTER — Encounter (HOSPITAL_COMMUNITY): Payer: Self-pay | Admitting: Emergency Medicine

## 2021-12-02 ENCOUNTER — Emergency Department (HOSPITAL_COMMUNITY)
Admission: EM | Admit: 2021-12-02 | Discharge: 2021-12-03 | Disposition: A | Discharge status: Skilled Nursing Facility | Payer: Medicare Other | Attending: Emergency Medicine | Admitting: Emergency Medicine

## 2021-12-02 DIAGNOSIS — J449 Chronic obstructive pulmonary disease, unspecified: Secondary | ICD-10-CM | POA: Diagnosis not present

## 2021-12-02 DIAGNOSIS — S2091XA Abrasion of unspecified parts of thorax, initial encounter: Secondary | ICD-10-CM | POA: Insufficient documentation

## 2021-12-02 DIAGNOSIS — S0083XA Contusion of other part of head, initial encounter: Secondary | ICD-10-CM

## 2021-12-02 DIAGNOSIS — E119 Type 2 diabetes mellitus without complications: Secondary | ICD-10-CM | POA: Diagnosis not present

## 2021-12-02 DIAGNOSIS — Z20822 Contact with and (suspected) exposure to covid-19: Secondary | ICD-10-CM | POA: Insufficient documentation

## 2021-12-02 DIAGNOSIS — Z7982 Long term (current) use of aspirin: Secondary | ICD-10-CM | POA: Diagnosis not present

## 2021-12-02 DIAGNOSIS — W19XXXA Unspecified fall, initial encounter: Secondary | ICD-10-CM

## 2021-12-02 DIAGNOSIS — I10 Essential (primary) hypertension: Secondary | ICD-10-CM | POA: Diagnosis not present

## 2021-12-02 DIAGNOSIS — Z79899 Other long term (current) drug therapy: Secondary | ICD-10-CM | POA: Insufficient documentation

## 2021-12-02 DIAGNOSIS — S50812A Abrasion of left forearm, initial encounter: Secondary | ICD-10-CM | POA: Diagnosis not present

## 2021-12-02 DIAGNOSIS — W1839XA Other fall on same level, initial encounter: Secondary | ICD-10-CM | POA: Diagnosis not present

## 2021-12-02 DIAGNOSIS — Z794 Long term (current) use of insulin: Secondary | ICD-10-CM | POA: Diagnosis not present

## 2021-12-02 DIAGNOSIS — S0990XA Unspecified injury of head, initial encounter: Secondary | ICD-10-CM | POA: Diagnosis present

## 2021-12-02 LAB — COMPREHENSIVE METABOLIC PANEL
ALT: 12 U/L (ref 0–44)
AST: 16 U/L (ref 15–41)
Albumin: 2.6 g/dL — ABNORMAL LOW (ref 3.5–5.0)
Alkaline Phosphatase: 59 U/L (ref 38–126)
Anion gap: 7 (ref 5–15)
BUN: 18 mg/dL (ref 8–23)
CO2: 26 mmol/L (ref 22–32)
Calcium: 9.5 mg/dL (ref 8.9–10.3)
Chloride: 101 mmol/L (ref 98–111)
Creatinine, Ser: 0.83 mg/dL (ref 0.44–1.00)
GFR, Estimated: >60 mL/min (ref >60)
Glucose, Bld: 229 mg/dL — ABNORMAL HIGH (ref 70–99)
Potassium: 4.4 mmol/L (ref 3.5–5.1)
Sodium: 134 mmol/L — ABNORMAL LOW (ref 135–145)
Total Bilirubin: 0.5 mg/dL (ref 0.3–1.2)
Total Protein: 5.8 g/dL — ABNORMAL LOW (ref 6.5–8.1)

## 2021-12-02 LAB — CBC WITH DIFFERENTIAL/PLATELET
Abs Immature Granulocytes: 0.08 10*3/uL — ABNORMAL HIGH (ref 0.00–0.07)
Basophils Absolute: 0 10*3/uL (ref 0.0–0.1)
Basophils Relative: 0 %
Eosinophils Absolute: 0 10*3/uL (ref 0.0–0.5)
Eosinophils Relative: 0 %
HCT: 37.4 % (ref 36.0–46.0)
Hemoglobin: 12.3 g/dL (ref 12.0–15.0)
Immature Granulocytes: 1 %
Lymphocytes Relative: 13 %
Lymphs Abs: 1 10*3/uL (ref 0.7–4.0)
MCH: 32.7 pg (ref 26.0–34.0)
MCHC: 32.9 g/dL (ref 30.0–36.0)
MCV: 99.5 fL (ref 80.0–100.0)
Monocytes Absolute: 1 10*3/uL (ref 0.1–1.0)
Monocytes Relative: 13 %
Neutro Abs: 5.3 10*3/uL (ref 1.7–7.7)
Neutrophils Relative %: 73 %
Platelets: 169 10*3/uL (ref 150–400)
RBC: 3.76 MIL/uL — ABNORMAL LOW (ref 3.87–5.11)
RDW: 18.1 % — ABNORMAL HIGH (ref 11.5–15.5)
WBC: 7.4 10*3/uL (ref 4.0–10.5)
nRBC: 0 % (ref 0.0–0.2)

## 2021-12-02 LAB — TROPONIN I (HIGH SENSITIVITY)
Troponin I (High Sensitivity): 11 ng/L (ref <18)
Troponin I (High Sensitivity): 11 ng/L (ref <18)

## 2021-12-02 LAB — RESP PANEL BY RT-PCR (FLU A&B, COVID) ARPGX2
Influenza A by PCR: NEGATIVE
Influenza B by PCR: NEGATIVE
SARS Coronavirus 2 by RT PCR: NEGATIVE

## 2021-12-02 LAB — CK: Total CK: 25 U/L — ABNORMAL LOW (ref 38–234)

## 2021-12-02 LAB — LIPASE, BLOOD: Lipase: 24 U/L (ref 11–51)

## 2021-12-02 IMAGING — CR DG HUMERUS 2V *L*
2 series · 2 of 2 positions shown · non-contrast
Comparison: [DATE]

CLINICAL DATA: Fall

EXAM:
LEFT HUMERUS - 2+ VIEW

[humerus ap]
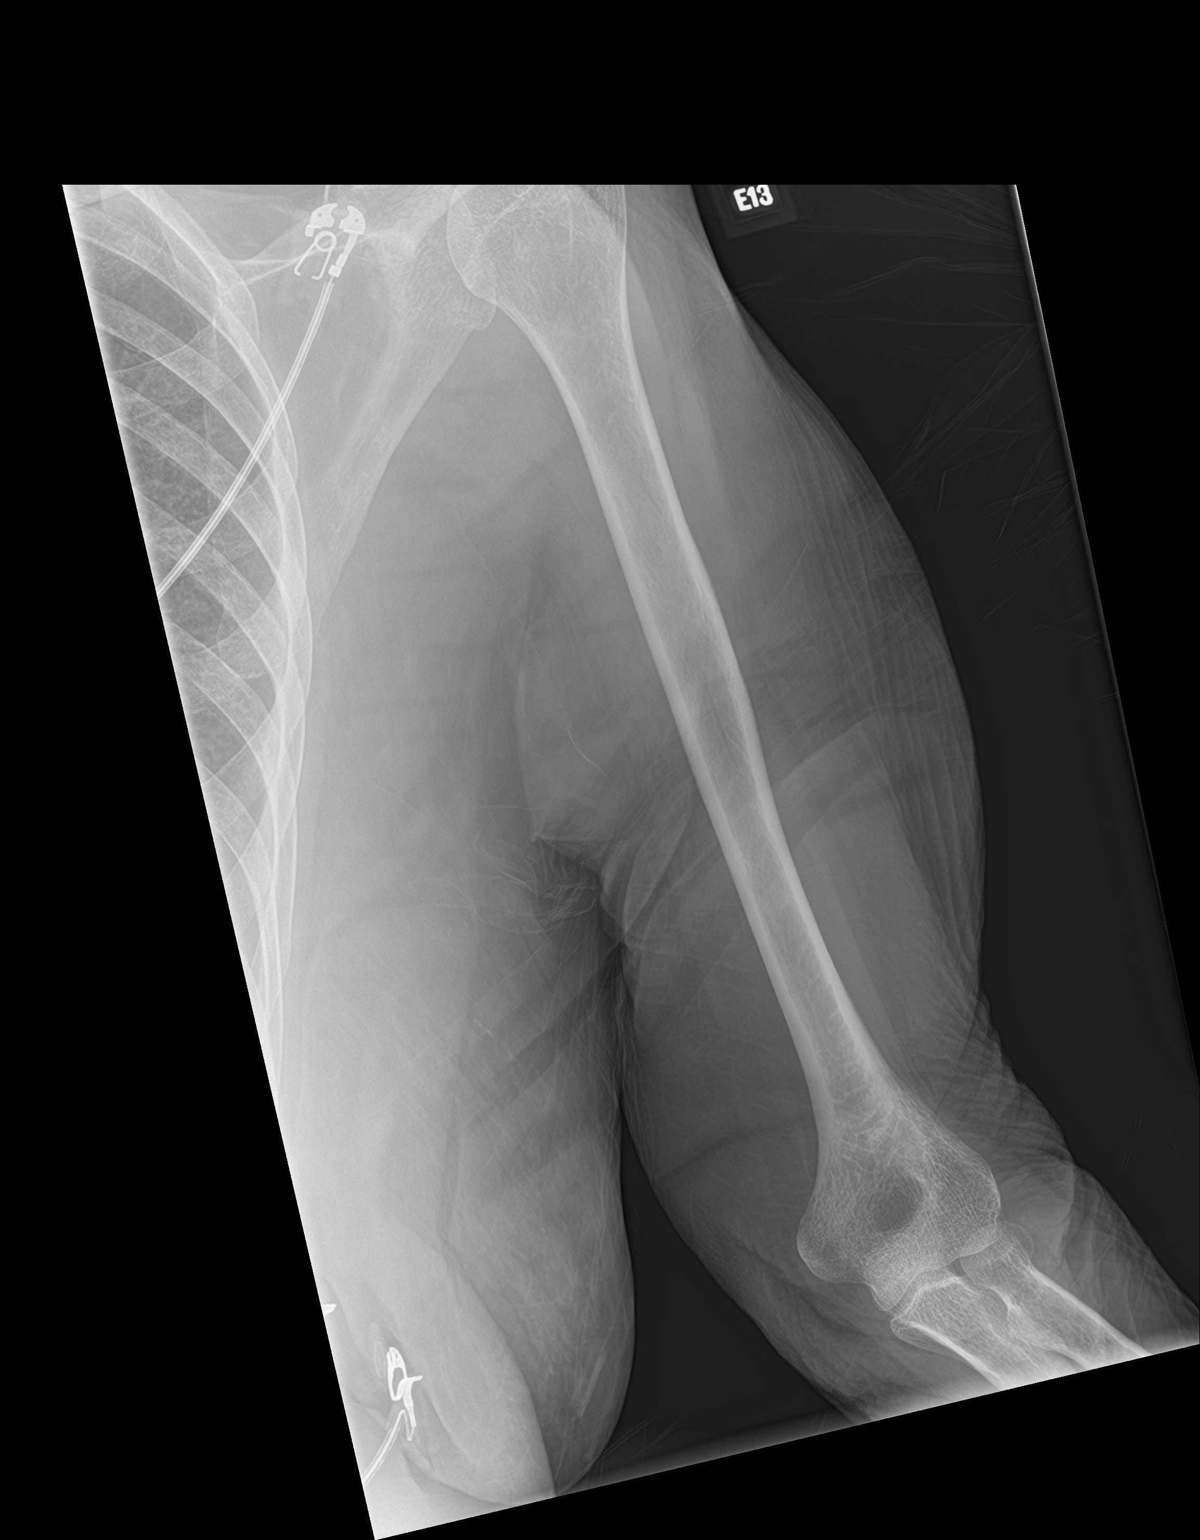

[humerus lat]
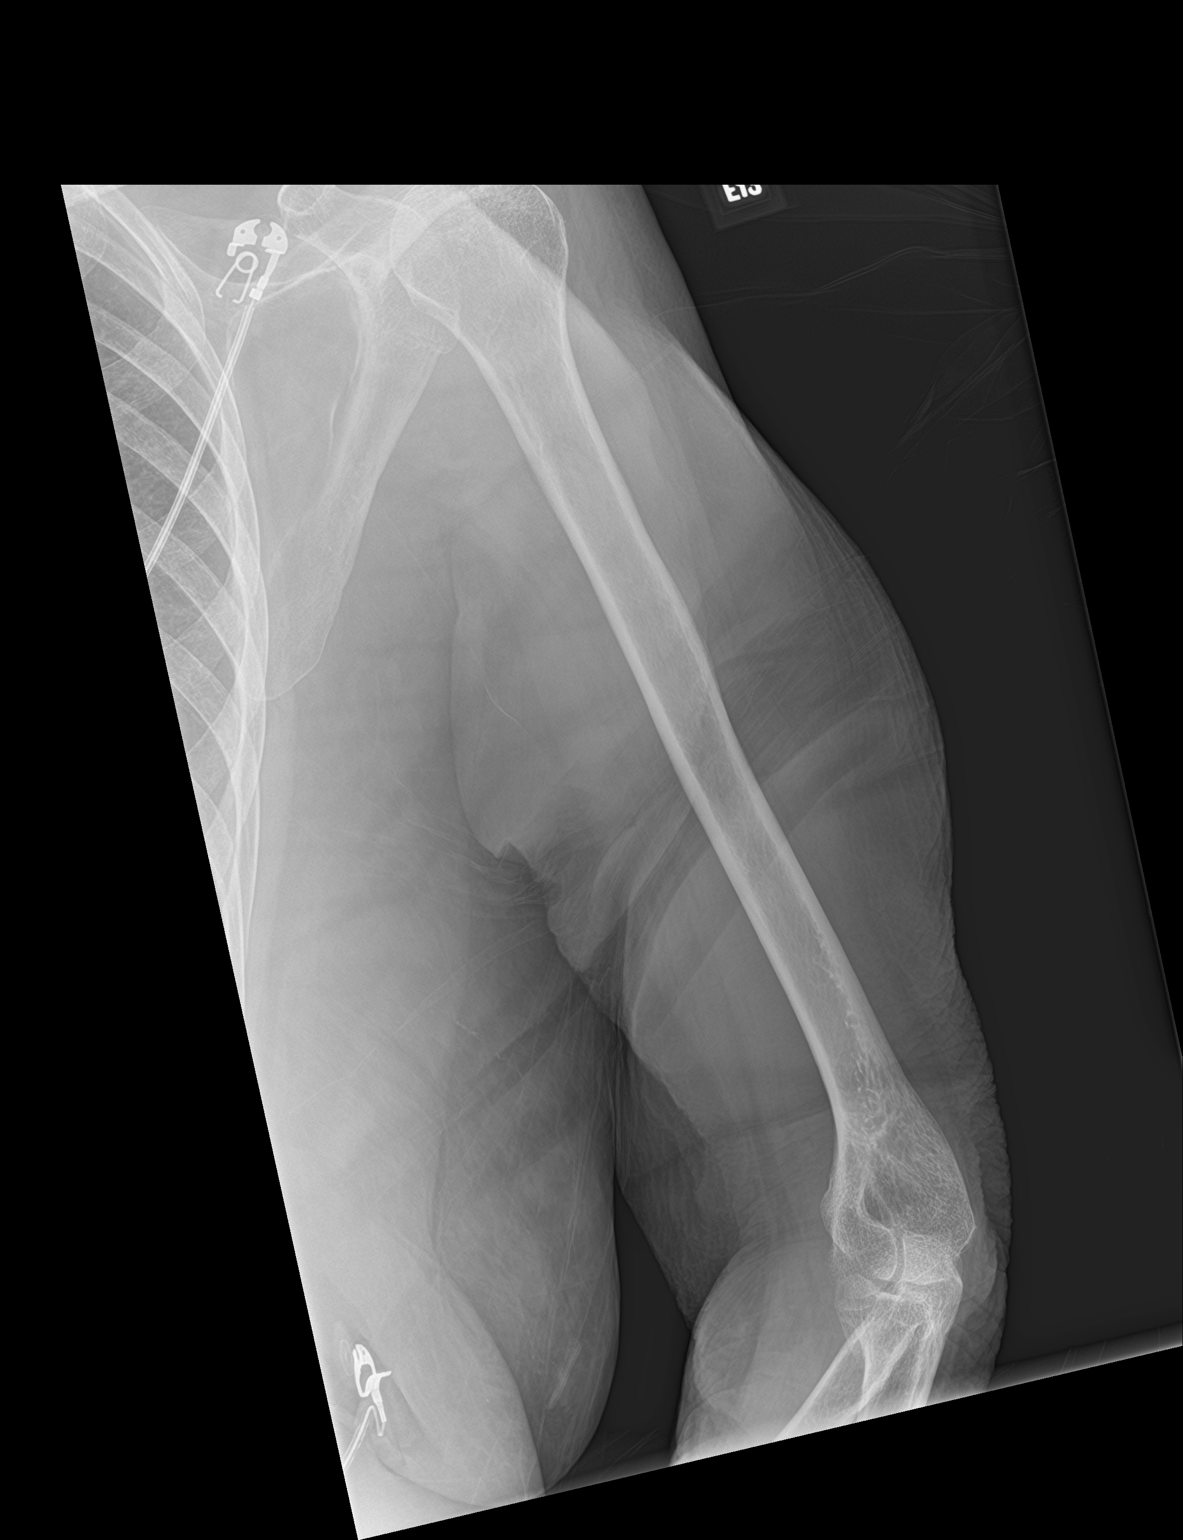

[2 of 2 positions shown; findings below may reference images not displayed]

FINDINGS: There is no evidence of fracture or other focal bone lesions. Soft
tissues are unremarkable.
IMPRESSION: Negative.

## 2021-12-02 IMAGING — CT CT MAXILLOFACIAL W/O CM
3 series · 14 of 47 positions shown, 16 images · non-contrast
Comparison: [DATE].

CLINICAL DATA: Head trauma following un witnessed fall in a
70-year-old female.



[Series 3: facialbone 2.0 st · axial · 0.31mm/px · z∈[+1292,+1442]mm · 8 of 88 slices shown, 10 images]
[im 7/88  brain]
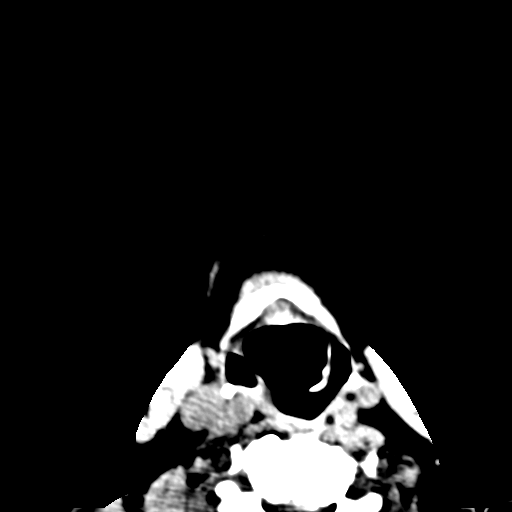
[im 7/88  bone]
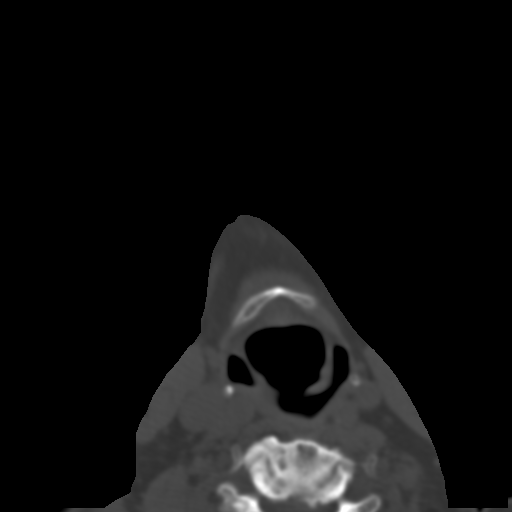
[im 19/88  bone]
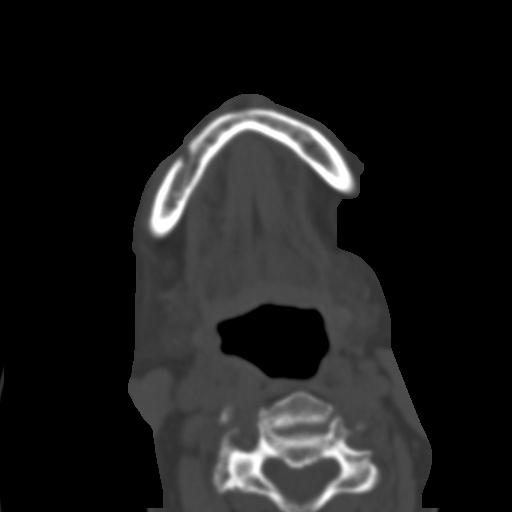
[im 28/88  bone]
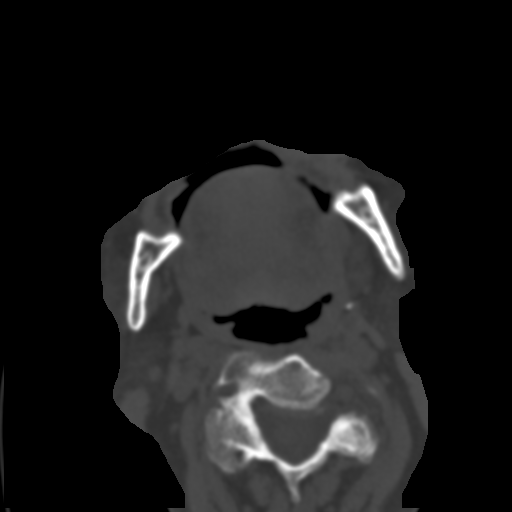
[im 40/88  bone]
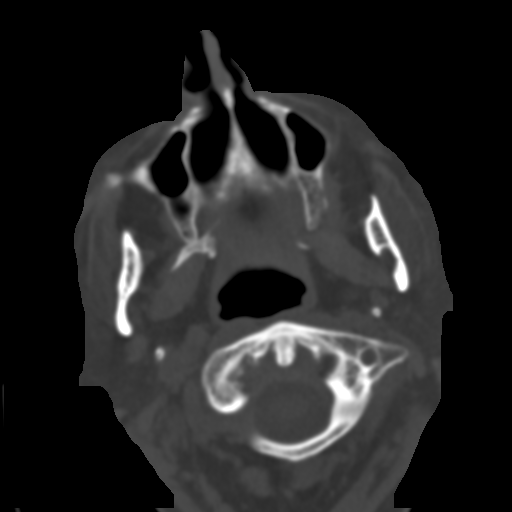
[im 49/88  brain]
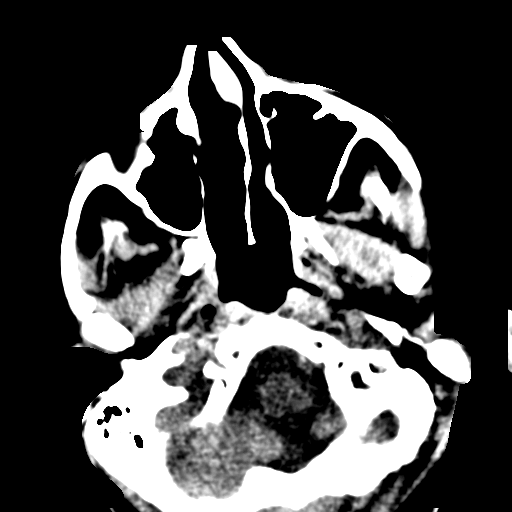
[im 49/88  bone]
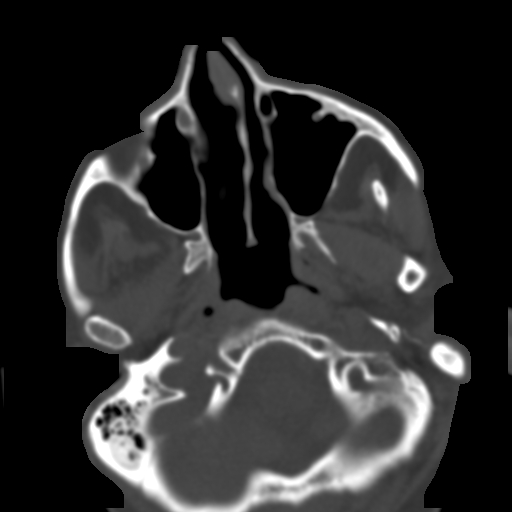
[im 61/88  bone]
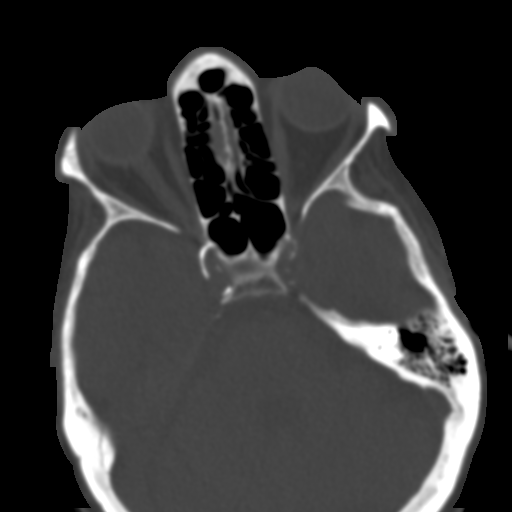
[im 70/88  bone]
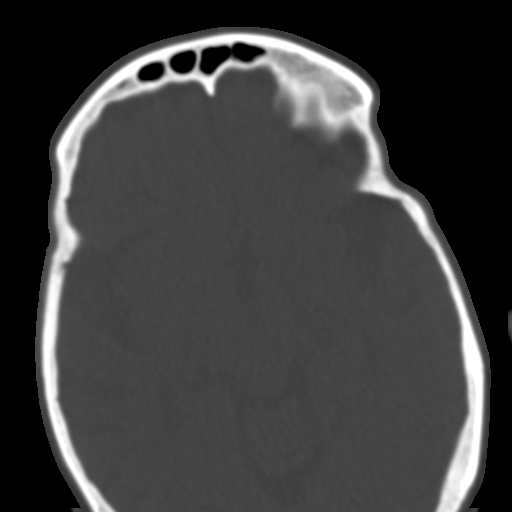
[im 82/88  bone]
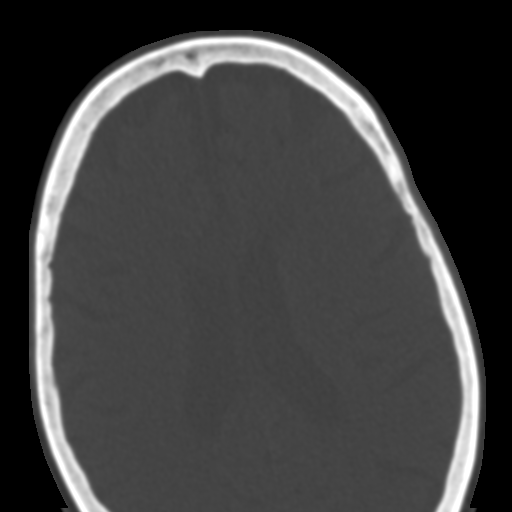

[Series 6: facialbone 2.0 cor st · coronal · 0.29mm/px · 3 of 63 slices shown]
[im 21/63  bone]
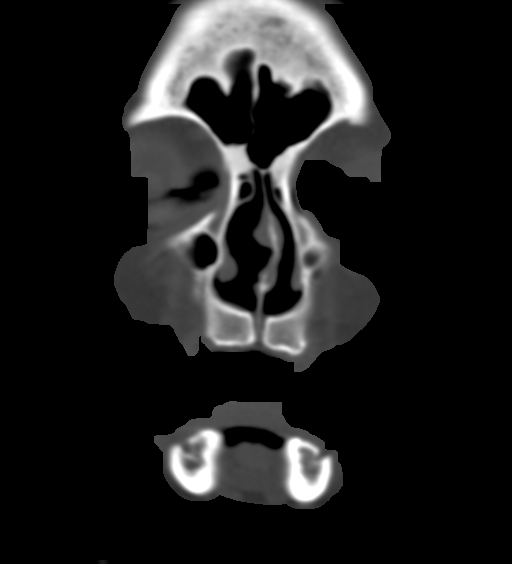
[im 28/63  bone]
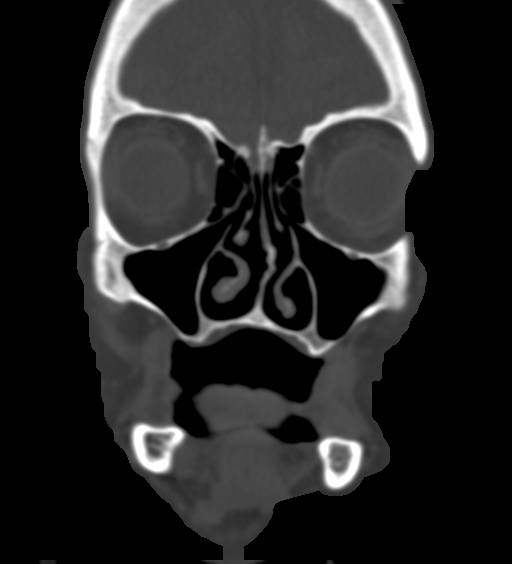
[im 35/63  bone]
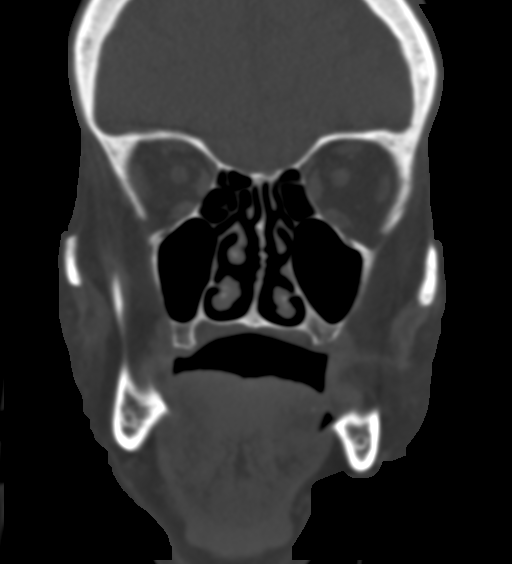

[Series 8: facialbone 2.0 sag st · sagittal · 0.31mm/px · 3 of 70 slices shown]
[im 24/70  bone]
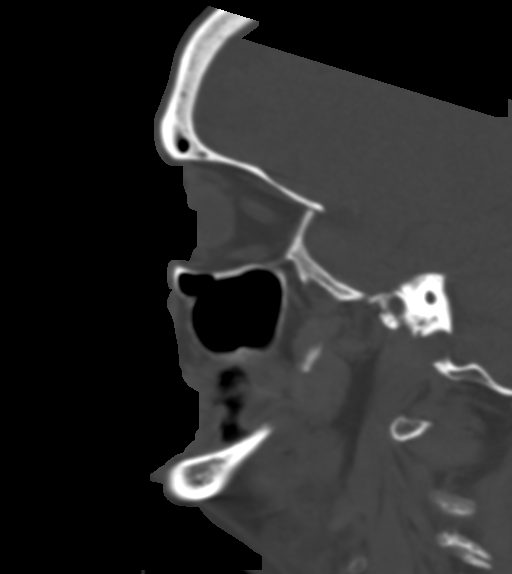
[im 35/70  bone]
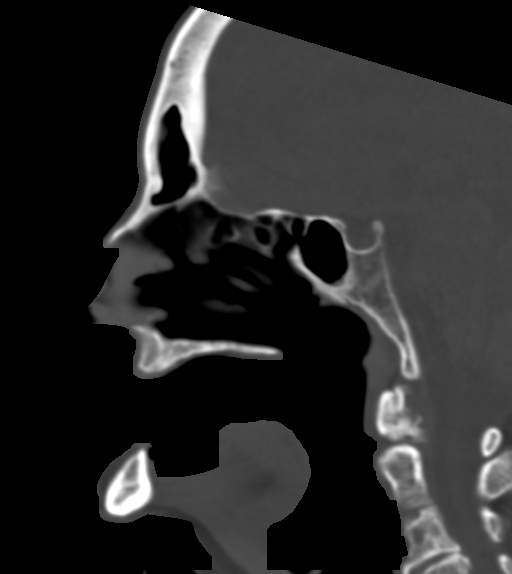
[im 47/70  bone]
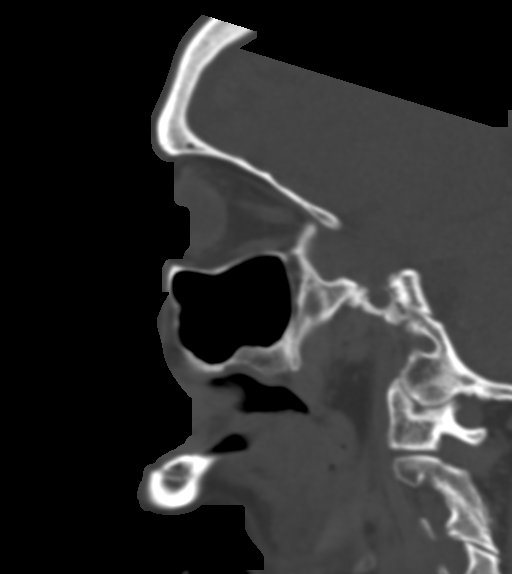

[14 of 47 positions shown; findings below may reference images not displayed]

FINDINGS: CT HEAD FINDINGS

Brain: No evidence of acute infarction, hemorrhage, hydrocephalus,
extra-axial collection or mass lesion/mass effect. Signs of atrophy
and chronic microvascular ischemic change as before.

Vascular: No hyperdense vessel or unexpected calcification.

Skull: Normal. Negative for fracture or focal lesion.

Other: Small scalp hematoma overlying the LEFT frontal region.

CT MAXILLOFACIAL FINDINGS

Osseous: No fracture or mandibular dislocation. No destructive
process.

Orbits: Negative. No traumatic or inflammatory finding.

Sinuses: Clear.

Soft tissues: Mild stranding over the LEFT frontal region better
visualized on the concurrent CT which is reported separately.

CT CERVICAL SPINE FINDINGS

Alignment: Straightening of normal cervical lordotic curvature with
mild reversal of normal cervical lordotic curvature in the
midcervical spine which is similar to prior imaging and seen in the
setting of degenerative changes.

Skull base and vertebrae: No acute fracture. No primary bone lesion
or focal pathologic process.

Soft tissues and spinal canal: No prevertebral fluid or swelling. No
visible canal hematoma.

Disc levels: Multilevel degenerative changes involving disc spaces,
uncovertebral spurring and facet arthropathy as before. Cervical
spinal canal narrowing due to degenerative changes in the mid and
lower cervical spine as before.

Upper chest: Negative.

Other: None
IMPRESSION: 1. No acute intracranial abnormality.
2. Small scalp hematoma overlying the LEFT frontal region without
underlying skull fracture.
3. No evidence of acute facial bone fracture.
4. No evidence of acute fracture or subluxation of the cervical
spine.
5. Marked multilevel degenerative changes involving the cervical
spine as before.

## 2021-12-02 IMAGING — CT CT HEAD W/O CM
4 series · 15 of 47 positions shown, 17 images · non-contrast
Comparison: [DATE].

CLINICAL DATA: Head trauma following un witnessed fall in a
70-year-old female.



[Series 3: head without · axial · non-contrast · 0.42mm/px · z∈[+1390,+1510]mm · 7 of 32 slices shown, 9 images]
[im 4/32  brain]
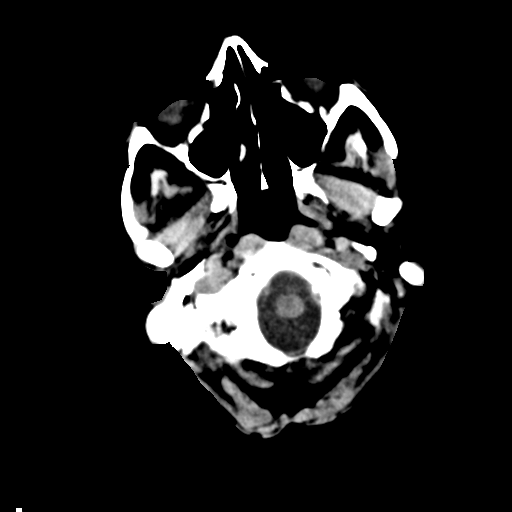
[im 4/32  bone]
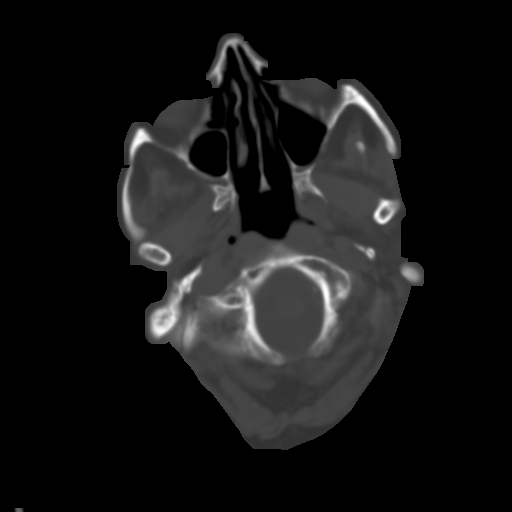
[im 8/32  brain]
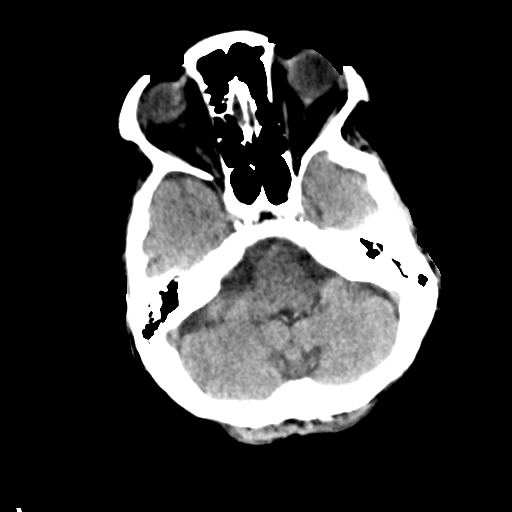
[im 12/32  brain]
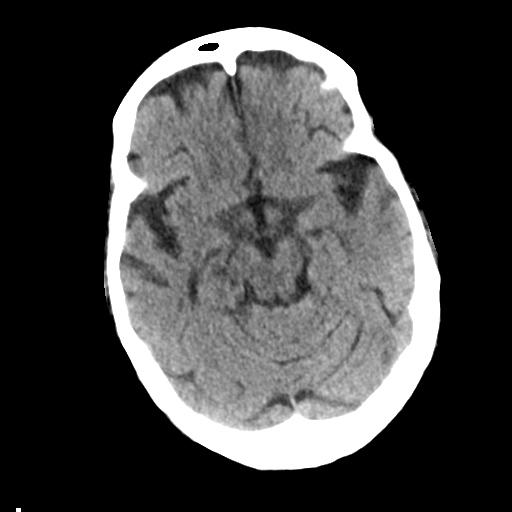
[im 16/32  brain]
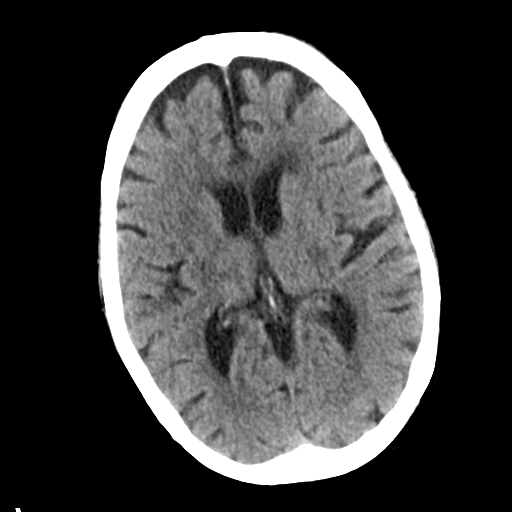
[im 20/32  brain]
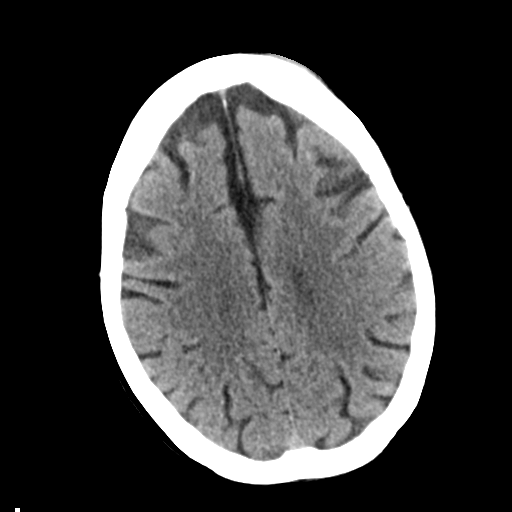
[im 20/32  bone]
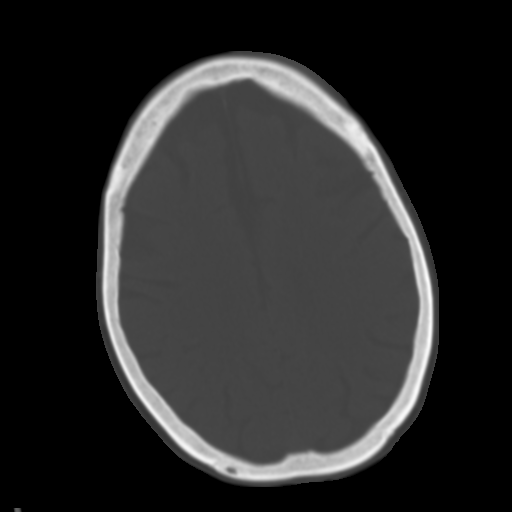
[im 24/32  brain]
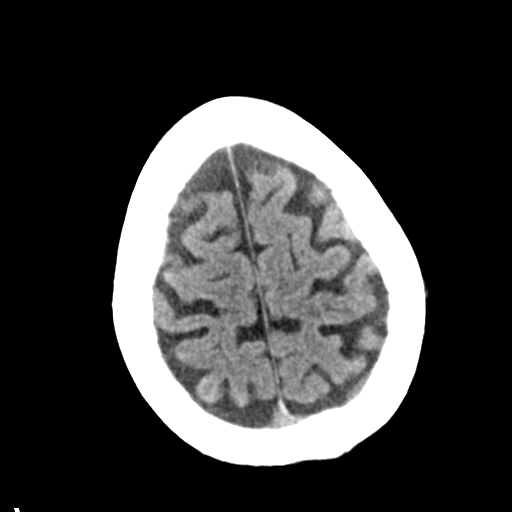
[im 28/32  brain]
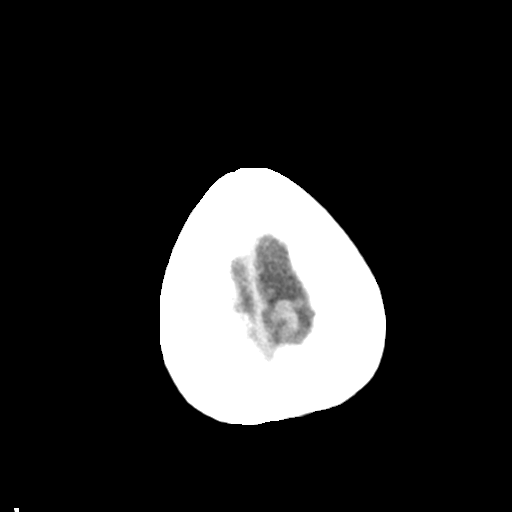

[Series 4: head bone · axial · 0.42mm/px · z∈[+1389,+1405]mm · 2 of 80 slices shown]
[im 8/80  bone]
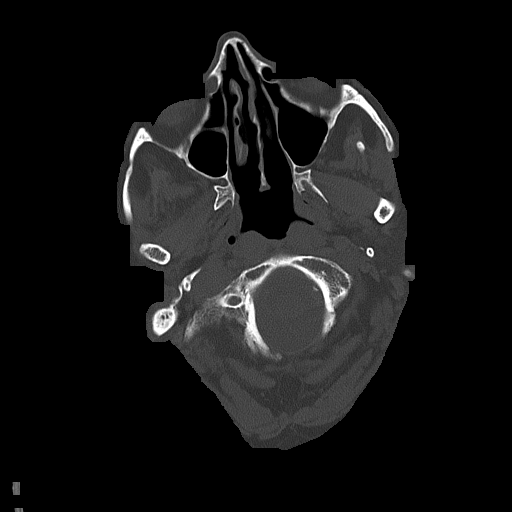
[im 16/80  bone]
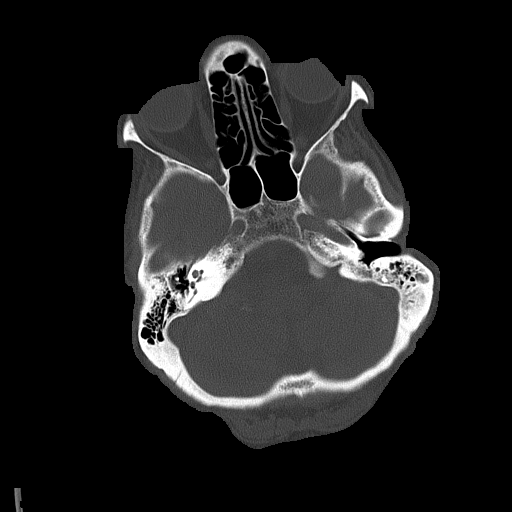

[Series 5: head without cor · coronal · non-contrast · 0.32mm/px · 3 of 69 slices shown]
[im 23/69  brain]
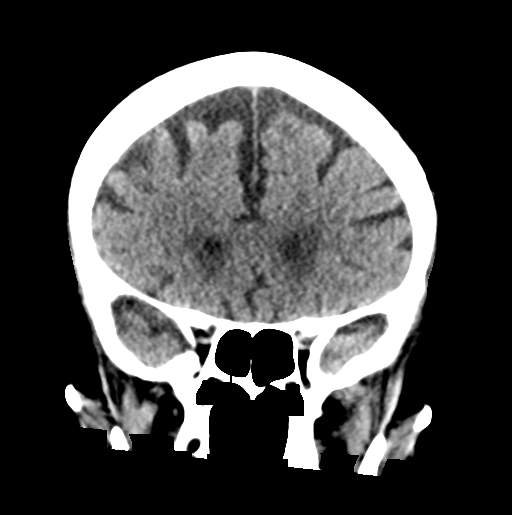
[im 31/69  brain]
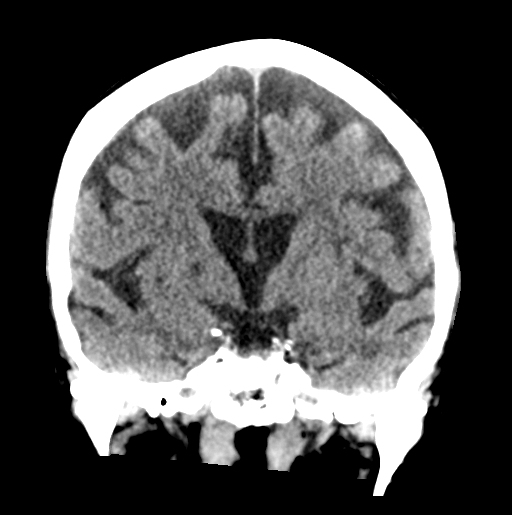
[im 38/69  brain]
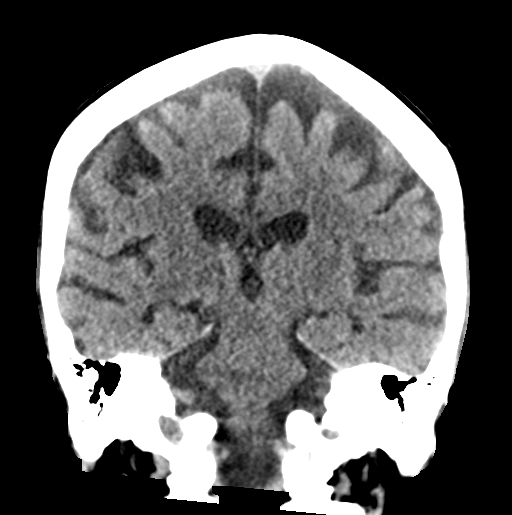

[Series 6: head without sag · sagittal · non-contrast · 0.31mm/px · 3 of 50 slices shown]
[im 17/50  brain]
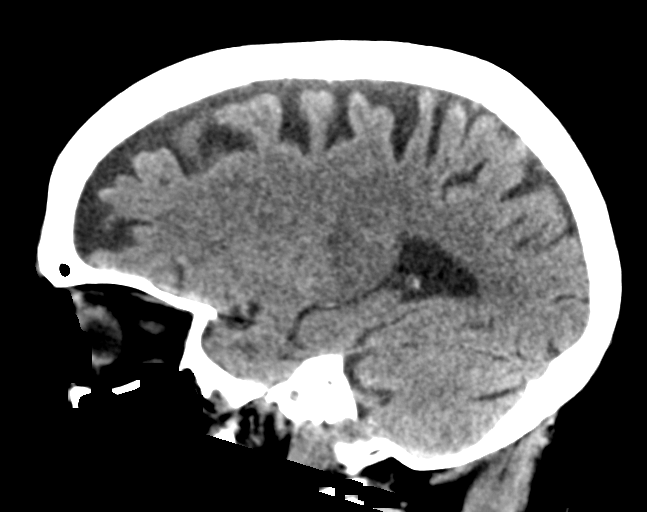
[im 25/50  brain]
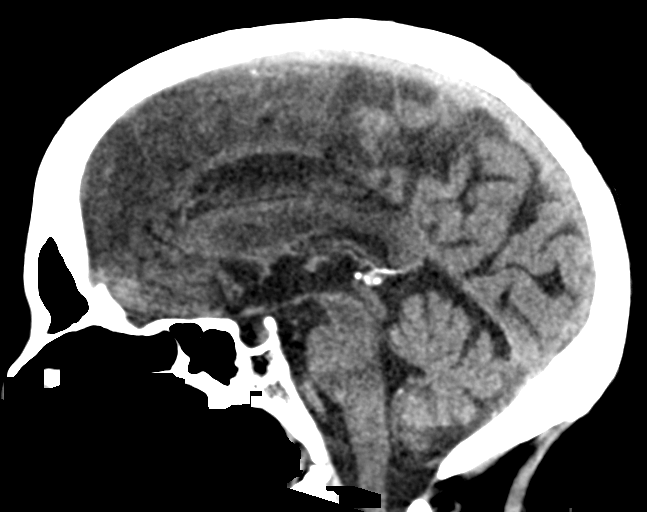
[im 33/50  brain]
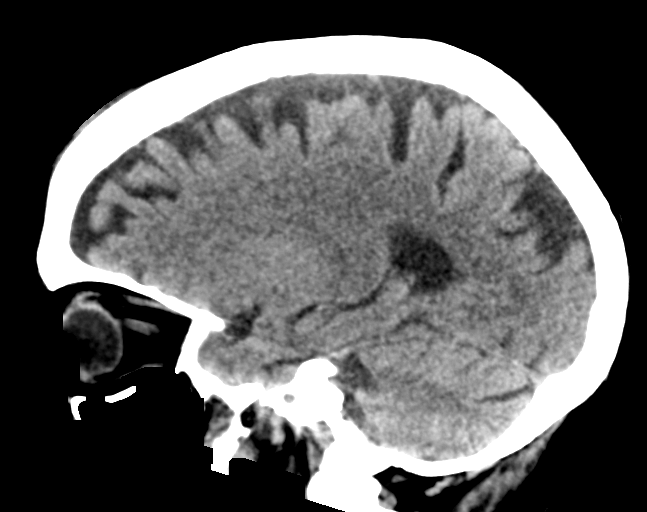

[15 of 47 positions shown; findings below may reference images not displayed]

FINDINGS: CT HEAD FINDINGS

Brain: No evidence of acute infarction, hemorrhage, hydrocephalus,
extra-axial collection or mass lesion/mass effect. Signs of atrophy
and chronic microvascular ischemic change as before.

Vascular: No hyperdense vessel or unexpected calcification.

Skull: Normal. Negative for fracture or focal lesion.

Other: Small scalp hematoma overlying the LEFT frontal region.

CT MAXILLOFACIAL FINDINGS

Osseous: No fracture or mandibular dislocation. No destructive
process.

Orbits: Negative. No traumatic or inflammatory finding.

Sinuses: Clear.

Soft tissues: Mild stranding over the LEFT frontal region better
visualized on the concurrent CT which is reported separately.

CT CERVICAL SPINE FINDINGS

Alignment: Straightening of normal cervical lordotic curvature with
mild reversal of normal cervical lordotic curvature in the
midcervical spine which is similar to prior imaging and seen in the
setting of degenerative changes.

Skull base and vertebrae: No acute fracture. No primary bone lesion
or focal pathologic process.

Soft tissues and spinal canal: No prevertebral fluid or swelling. No
visible canal hematoma.

Disc levels: Multilevel degenerative changes involving disc spaces,
uncovertebral spurring and facet arthropathy as before. Cervical
spinal canal narrowing due to degenerative changes in the mid and
lower cervical spine as before.

Upper chest: Negative.

Other: None
IMPRESSION: 1. No acute intracranial abnormality.
2. Small scalp hematoma overlying the LEFT frontal region without
underlying skull fracture.
3. No evidence of acute facial bone fracture.
4. No evidence of acute fracture or subluxation of the cervical
spine.
5. Marked multilevel degenerative changes involving the cervical
spine as before.

## 2021-12-02 IMAGING — DX DG CHEST 1V PORT
1 series · 1 of 1 positions shown · non-contrast
Comparison: [DATE]

CLINICAL DATA: Left breast abrasion following a fall today.

EXAM:
PORTABLE CHEST 1 VIEW

[chest ap]
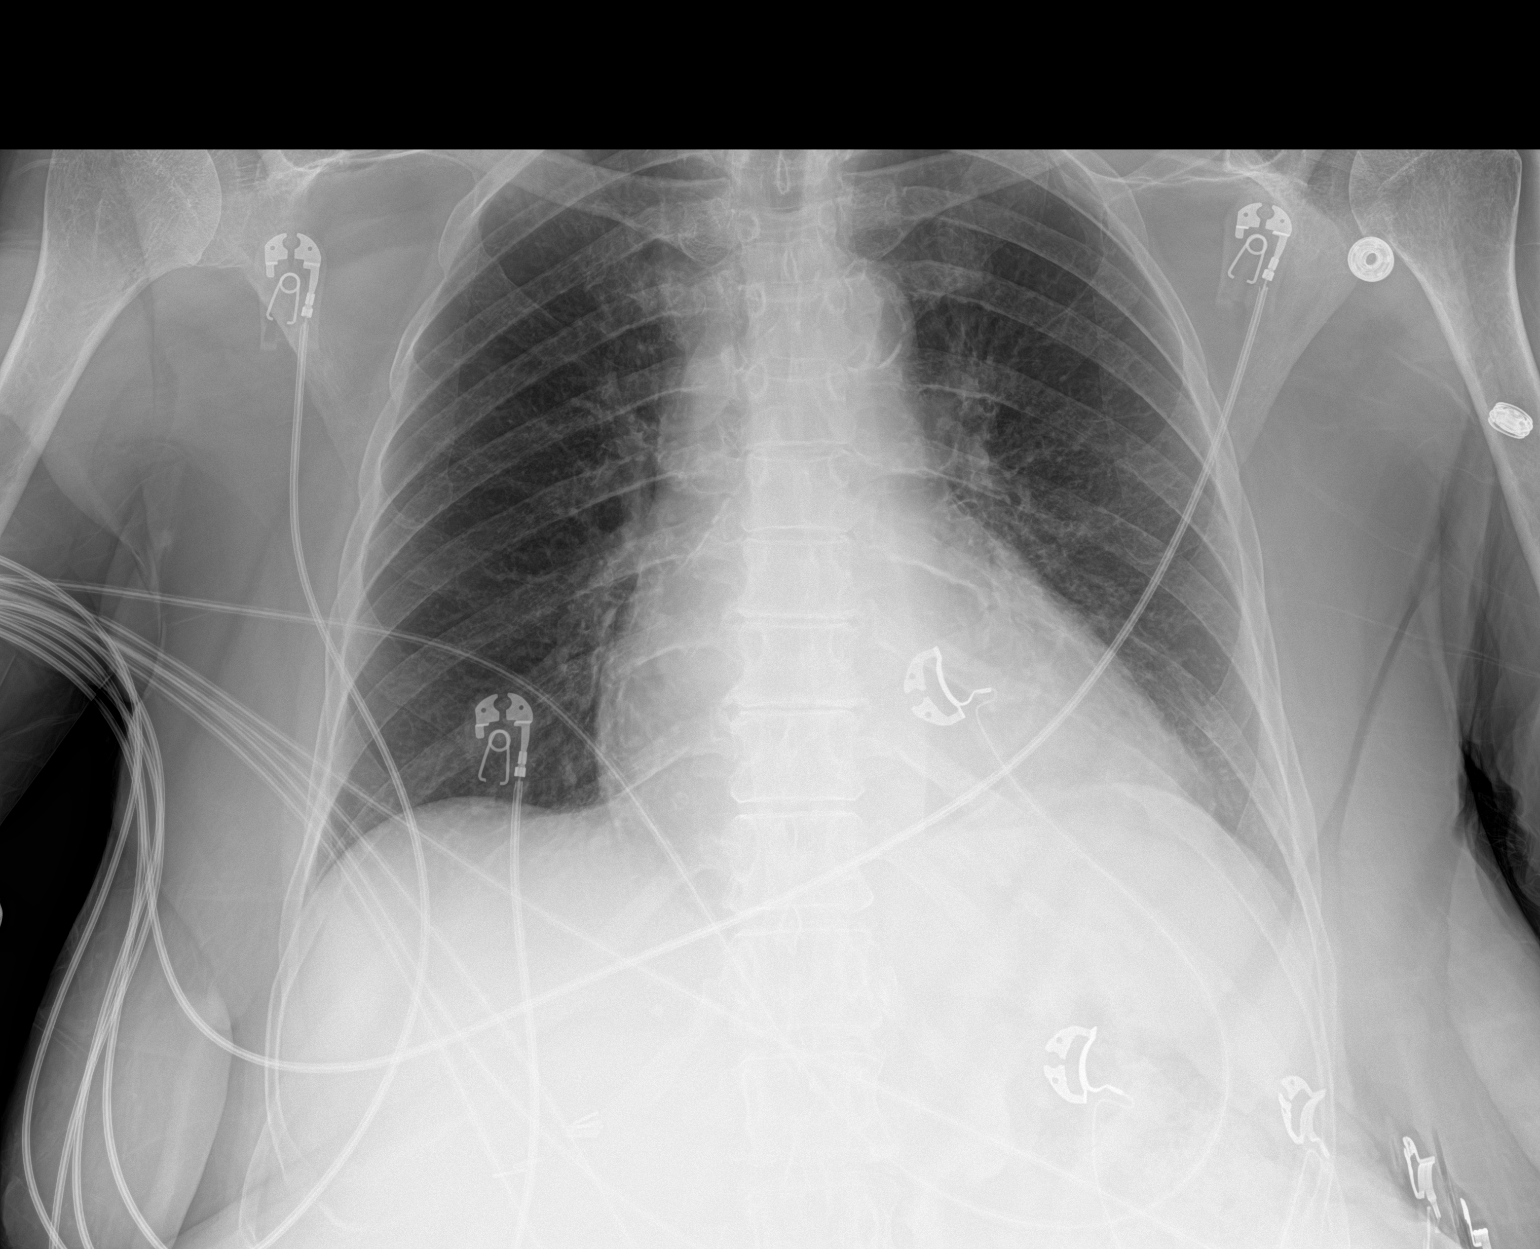

[1 of 1 positions shown; findings below may reference images not displayed]

FINDINGS: Mildly enlarged cardiac silhouette with an interval significant
decrease in size. Mildly tortuous and partially calcified thoracic
aorta. Clear lungs. No fracture or pneumothorax seen. Mild lower
thoracic spine degenerative changes.
IMPRESSION: No acute abnormality.

## 2021-12-02 IMAGING — CT CT CERVICAL SPINE W/O CM
3 of 4 series · 10 of 33 positions shown, 11 images · non-contrast
Comparison: [DATE].

CLINICAL DATA: Head trauma following un witnessed fall in a
70-year-old female.



[Series 4: c_spine 2.0 st · axial · 0.34mm/px · z∈[+1274,+1342]mm · 2 of 103 slices shown, 3 images]
[im 35/103  soft-tissue]
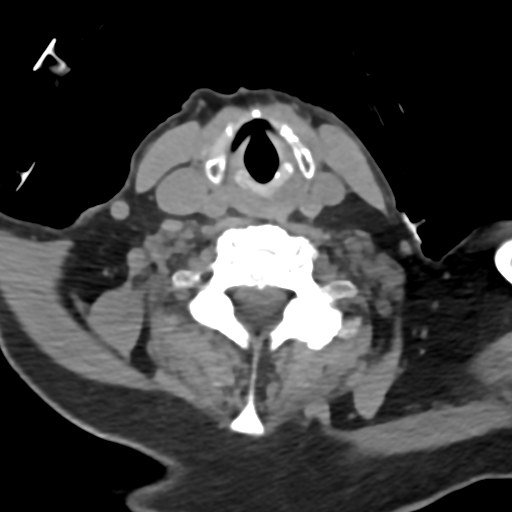
[im 35/103  bone]
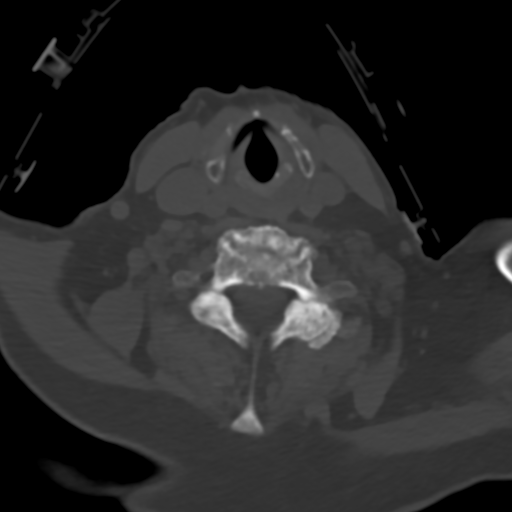
[im 69/103  bone]
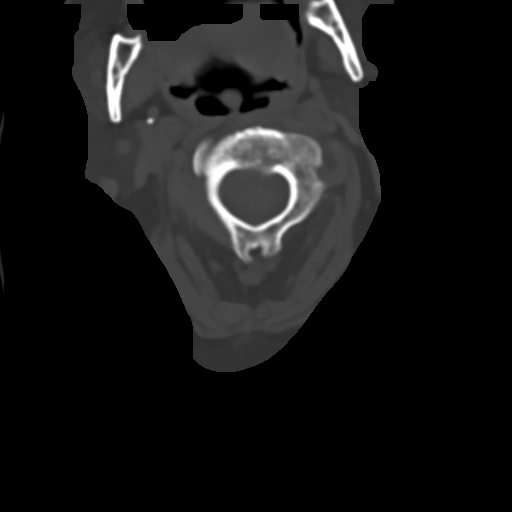

[Series 6: c_spine 2.0 sag bone · sagittal · 0.24mm/px · 5 of 50 slices shown]
[im 17/50  bone]
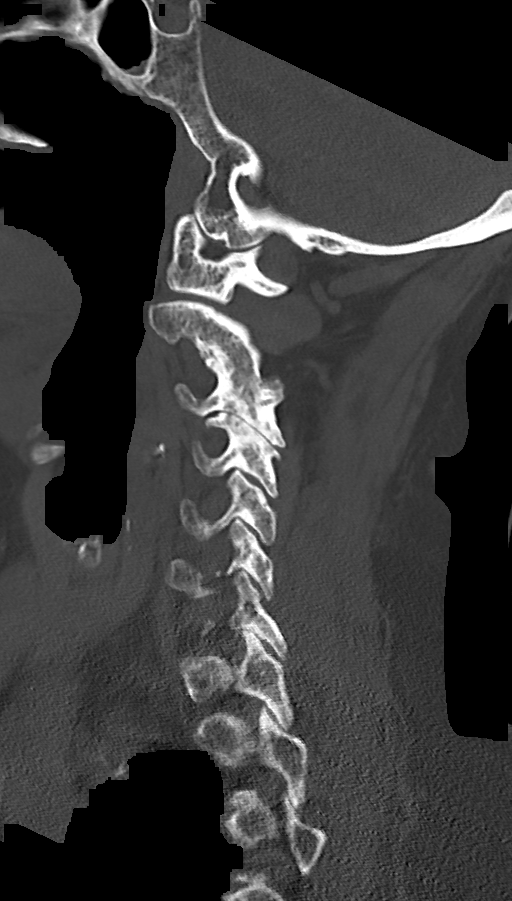
[im 21/50  bone]
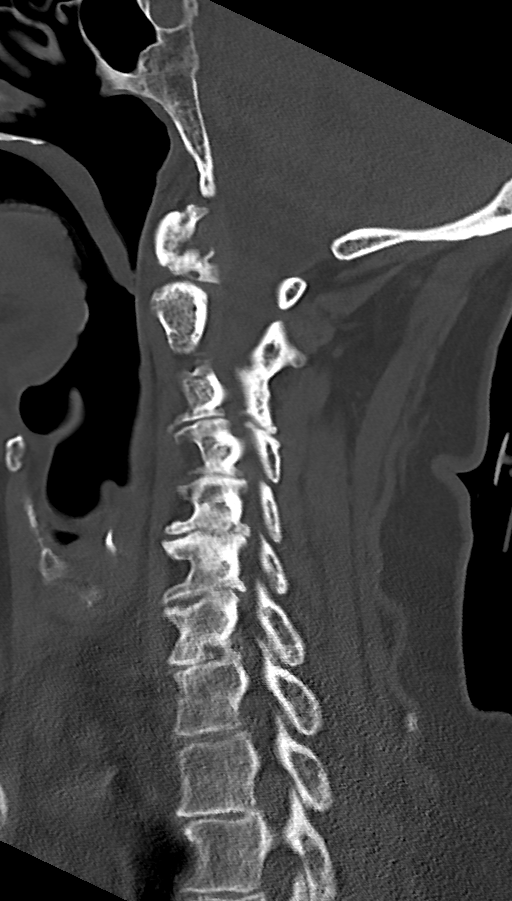
[im 25/50  bone]
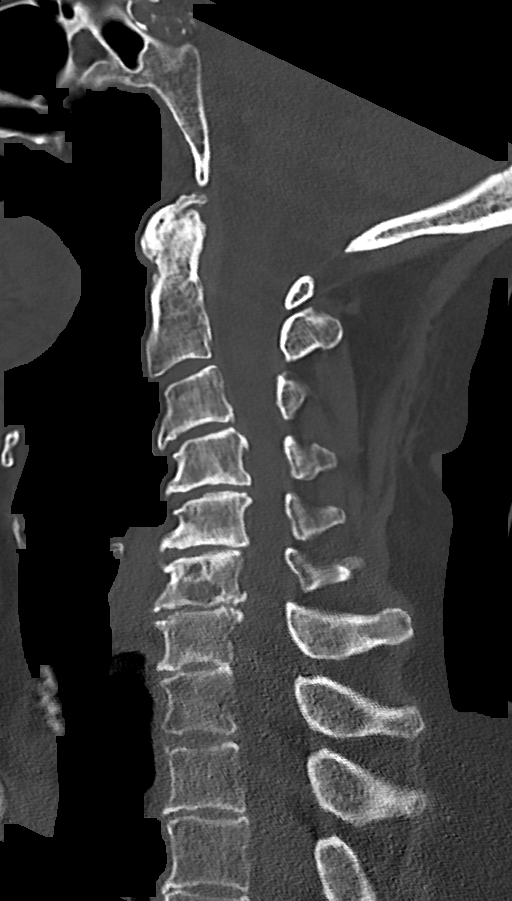
[im 29/50  bone]
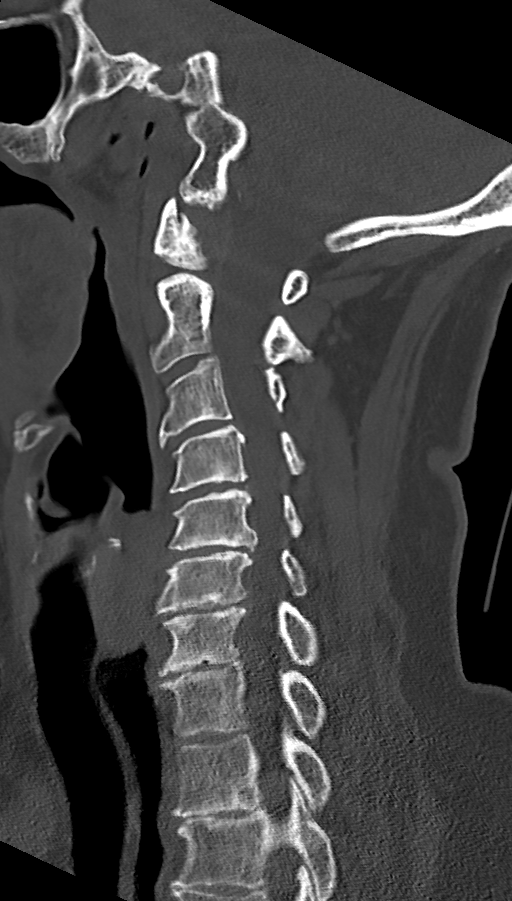
[im 33/50  bone]
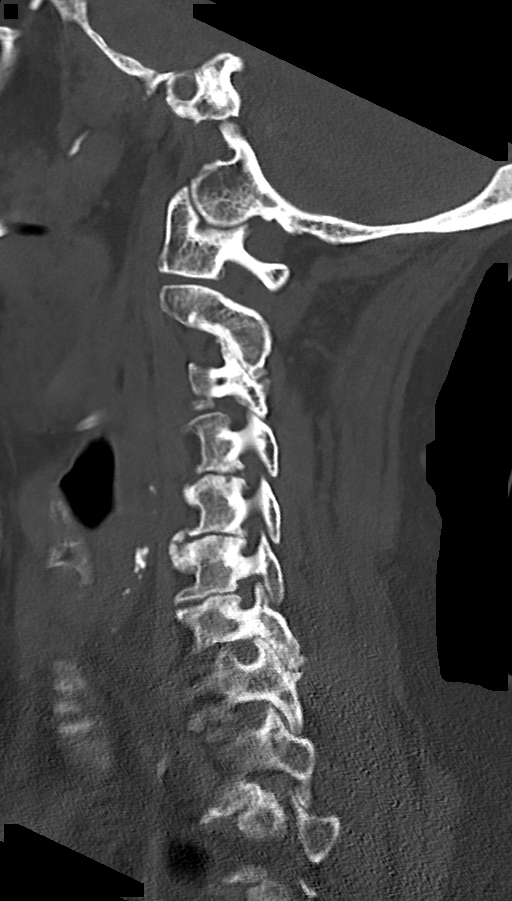

[Series 7: c_spine 2.0 cor bone · coronal · 0.19mm/px · 3 of 57 slices shown]
[im 12/57  bone]
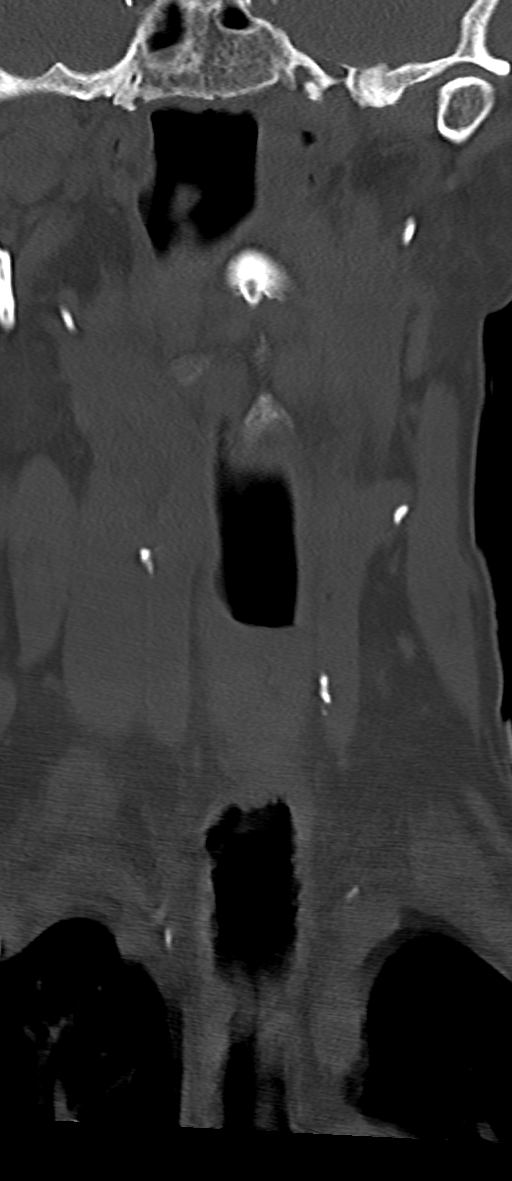
[im 23/57  bone]
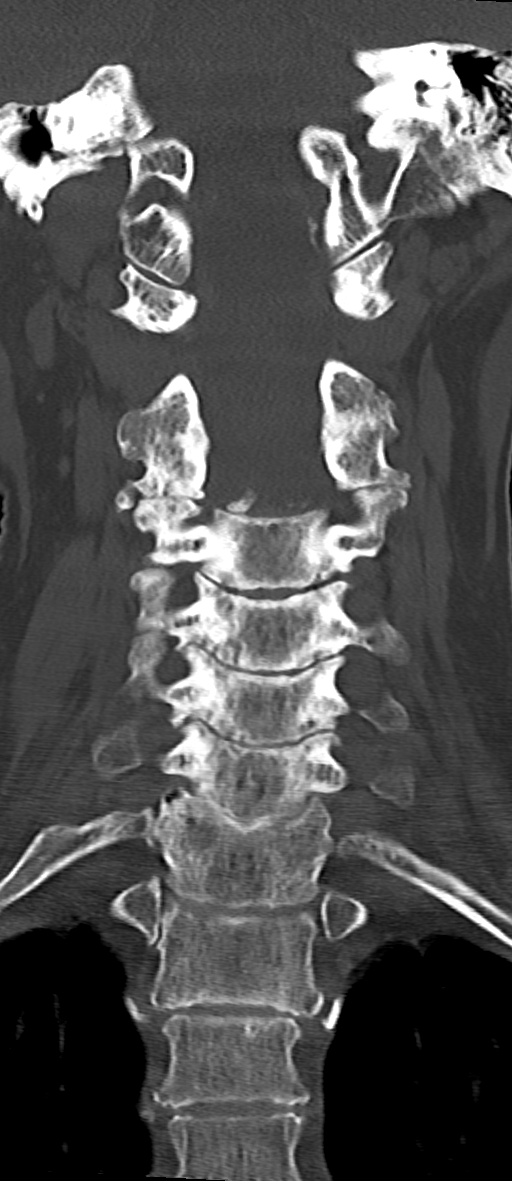
[im 34/57  bone]
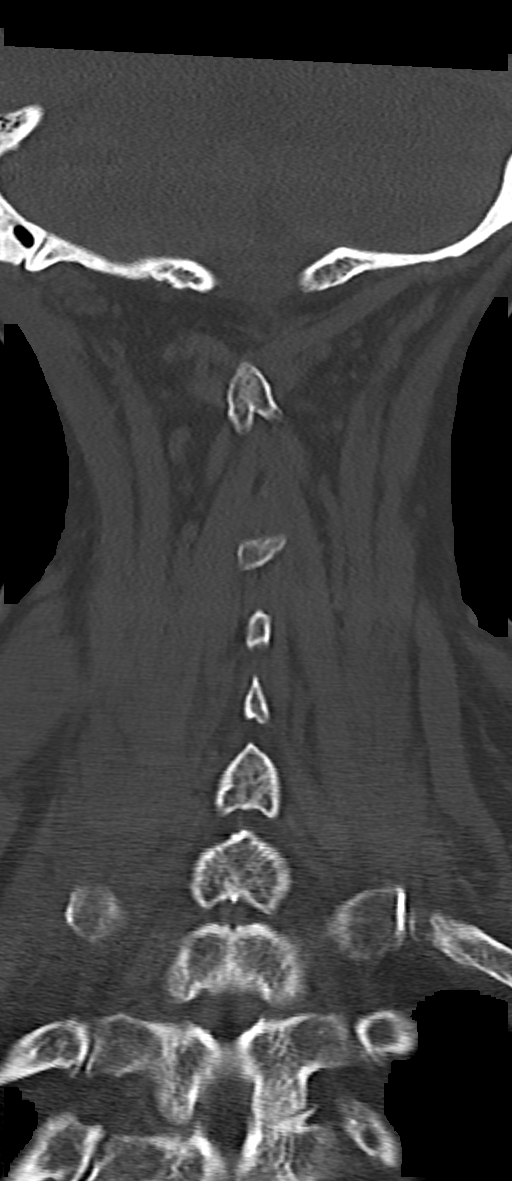

[10 of 33 positions shown; findings below may reference images not displayed]

FINDINGS: CT HEAD FINDINGS

Brain: No evidence of acute infarction, hemorrhage, hydrocephalus,
extra-axial collection or mass lesion/mass effect. Signs of atrophy
and chronic microvascular ischemic change as before.

Vascular: No hyperdense vessel or unexpected calcification.

Skull: Normal. Negative for fracture or focal lesion.

Other: Small scalp hematoma overlying the LEFT frontal region.

CT MAXILLOFACIAL FINDINGS

Osseous: No fracture or mandibular dislocation. No destructive
process.

Orbits: Negative. No traumatic or inflammatory finding.

Sinuses: Clear.

Soft tissues: Mild stranding over the LEFT frontal region better
visualized on the concurrent CT which is reported separately.

CT CERVICAL SPINE FINDINGS

Alignment: Straightening of normal cervical lordotic curvature with
mild reversal of normal cervical lordotic curvature in the
midcervical spine which is similar to prior imaging and seen in the
setting of degenerative changes.

Skull base and vertebrae: No acute fracture. No primary bone lesion
or focal pathologic process.

Soft tissues and spinal canal: No prevertebral fluid or swelling. No
visible canal hematoma.

Disc levels: Multilevel degenerative changes involving disc spaces,
uncovertebral spurring and facet arthropathy as before. Cervical
spinal canal narrowing due to degenerative changes in the mid and
lower cervical spine as before.

Upper chest: Negative.

Other: None
IMPRESSION: 1. No acute intracranial abnormality.
2. Small scalp hematoma overlying the LEFT frontal region without
underlying skull fracture.
3. No evidence of acute facial bone fracture.
4. No evidence of acute fracture or subluxation of the cervical
spine.
5. Marked multilevel degenerative changes involving the cervical
spine as before.

## 2021-12-02 MED ORDER — ACETAMINOPHEN 325 MG PO TABS
650.0000 mg | ORAL_TABLET | Freq: Once | ORAL | Status: AC
  Administered 2021-12-02: 650 mg via ORAL
  Filled 2021-12-02: qty 2

## 2021-12-02 MED ORDER — IBUPROFEN 400 MG PO TABS
600.0000 mg | ORAL_TABLET | Freq: Once | ORAL | Status: AC
  Administered 2021-12-02: 18:00:00 600 mg via ORAL
  Filled 2021-12-02: qty 1

## 2021-12-02 NOTE — ED Notes (Signed)
Pt given water and [place] sandwich to eat per MD.

## 2021-12-02 NOTE — ED Notes (Signed)
Patient transported to CT 

## 2021-12-02 NOTE — ED Notes (Signed)
RN attempted to call [facility] to updated on pt status and that she was being d/c.

## 2021-12-02 NOTE — ED Notes (Signed)
Pt is 2nd on [facility] list

## 2021-12-02 NOTE — ED Provider Notes (Signed)
[facility] EMERGENCY DEPARTMENT Provider Note   CSN: [id number] Arrival date & time: 12/02/21  1125     History  Chief Complaint  Patient presents with   Claudia Keller is a 71 y.o. female.  This is a 71 y.o. female with significant medical history as below, including COPD, hypertension, hyperlipidemia, type II DM who presents to the ED with complaint of fall. Pt reports she had a fall earlier today around 11 AM.  Patient ports that she was bending over to pick up something on the floor, lost her balance and fell forward onto the ground.  Reports that she "almost passed out."  Denies LOC, reports no longer taking blood thinners.  She felt nauseated after the fall, no emesis.  Does report normal state felt prior to onset of the symptoms.  No chest pain prior to fall, no palpitations.  No numbness or tingling.  Patient denies lying on the ground for an extended period of time.  Shortly after falling she was brought to the hospital.  She denies attempting to ambulate after the fall, reports that she was unable to get up on her own.  Patient reports that she lives with her dog at home alone and it is ready go home.  Per chart review patient resides at [facility].  Attempted to call patient's contact her son, message left, unable to speak with son.  Lillie Columbia [phone]     Suspect the patient may be minimizing her symptoms, suspect pos dementia    Past Medical History: No date: Arthritis No date: COPD (chronic obstructive pulmonary disease) (HCC) No date: Diverticulosis No date: Essential hypertension, benign No date: Hx of colonic polyps No date: Hypothyroidism No date: IBS (irritable bowel syndrome) No date: Ketoacidosis No date: Mixed hyperlipidemia No date: PTSD (post-traumatic stress disorder) 11/2009: S/P colonoscopy     Comment:  RMR: pancolonic diverticula, polypoid rectal mucosa with              prominent lymphoid aggregates, repeat in Jan  2016  No date: Stress incontinence, female No date: Tobacco use No date: Type 2 diabetes mellitus ([place])  Past Surgical History: No date: Bilateral tubal ligation 10/16/2014: BREAST BIOPSY; Left     Comment:  negative 09/11/2012: CHOLECYSTECTOMY     Comment:  Procedure: LAPAROSCOPIC CHOLECYSTECTOMY;  Surgeon: Jamesetta So, MD;  Location: [facility] ORS;  Service: General;                Laterality: N/A; 11/17/2009: COLONOSCOPY     Comment:  Dr. Gala Romney: normal rectum, pancolonic diverticula, polyps              benign, no adenomas.  02/12/2015: COLONOSCOPY; N/A     Comment:  Dr. Gala Romney: colonic diverticulosis, multiple colonic               polyps (tubular adenomas), negative segmental biopsies.               Surveillance in 2021 11/23/2011: ESOPHAGOGASTRODUODENOSCOPY     Comment:  Dr. Gala Romney: Erosive reflux esophagitis; small hiatal               hernia; esophagus dilated empirically 11/23/2011: MALONEY DILATION     Comment:  Procedure: Venia Minks DILATION;  Surgeon: Daneil Dolin,               MD;  Location: [facility] ENDO SUITE;  Service: Endoscopy;                Laterality: N/A; 11/06/2021: PERICARDIOCENTESIS; N/A     Comment:  Procedure: PERICARDIOCENTESIS;  Surgeon: Jettie Booze, MD;  Location: [place] [facility];  Service:               Cardiovascular;  Laterality: N/A; No date: SKIN LESION EXCISION     Comment:  on buttocks No date: TUBAL LIGATION    The history is provided by the patient and the EMS personnel. No language interpreter was used.  Fall Pertinent negatives include no chest pain, no abdominal pain, no headaches and no shortness of breath.      Home Medications Prior to Admission medications   Medication Sig Start Date End Date Taking? Authorizing Provider  acetaminophen (TYLENOL) 325 MG tablet Take 650 mg by mouth every 6 (six) hours as needed for mild pain.   Yes [provider]  albuterol (VENTOLIN HFA) 108 (90 Base) MCG/ACT  inhaler Inhale 2 puffs into the lungs every 4 (four) hours as needed for wheezing or shortness of breath. 12/03/20  Yes Manuella Ghazi, Pratik D, DO  allopurinol (ZYLOPRIM) 100 MG tablet Take 50 mg by mouth daily. Idiopathic gout   Yes [provider]  aspirin 81 MG chewable tablet Chew 81 mg by mouth daily.   Yes [provider]  bisacodyl (DULCOLAX) 10 MG suppository Place 10 mg rectally daily as needed (constipation not relieved by MOM).   Yes [provider]  Calcium Carb-Cholecalciferol (OYSTER SHELL CALCIUM W/D) 500-200 MG-UNIT TABS Take 1 tablet by mouth 2 (two) times daily. 11/10/20  Yes [provider]  colchicine 0.6 MG tablet Take 1 tablet (0.6 mg total) by mouth daily. 11/11/21 01/08/22 Yes Sharen Hones, MD  colestipol (COLESTID) 1 g tablet TAKE 2 TABLETS DAILY FOR DIARRHEA. DO NOT TAKE WITHIN 2 HOURS OF OTHER ORAL MEDICATIONS. HOLD FOR CONSTIPATION. Patient taking differently: Take 2 g by mouth daily. 05/23/20  Yes Carlis Stable, NP  diltiazem (CARDIZEM CD) 180 MG 24 hr capsule Take 180 mg by mouth daily.   Yes [provider]  divalproex (DEPAKOTE ER) 500 MG 24 hr tablet Take 2 tablets (1,000 mg total) by mouth every evening. 02/20/21  Yes Norman Clay, MD  divalproex (DEPAKOTE) 250 MG DR tablet Take 1 tablet (250 mg total) by mouth daily. 02/20/21  Yes Hisada, Elie Goody, MD  furosemide (LASIX) 20 MG tablet Take 1 tablet (20 mg total) by mouth every other day. 09/07/21  Yes Johnson, Clanford L, MD  insulin detemir (LEVEMIR) 100 UNIT/ML injection Inject 0.12 mLs (12 Units total) into the skin 2 (two) times daily. 11/10/21  Yes Sharen Hones, MD  levothyroxine (SYNTHROID) 200 MCG tablet Take 200 mcg by mouth daily before breakfast.   Yes [provider]  magnesium hydroxide (MILK OF MAGNESIA) 400 MG/5ML suspension Take 30 mLs by mouth daily as needed (if no BM in 3 days).   Yes [provider]  mirtazapine (REMERON) 15 MG tablet Take 15 mg by mouth at  bedtime.   Yes [provider]  Nutritional Supplements (NUTRITIONAL SUPPLEMENT PO) Take 1 each by mouth daily. Magic Cup   Yes [provider]  ondansetron (ZOFRAN) 4 MG tablet Take 1 tablet (4 mg total) by mouth every 8 (eight) hours as needed for nausea or vomiting. 10/17/18  Yes Carlis Stable, NP  pantoprazole (PROTONIX) 40 MG tablet TAKE 1 TABLET BY MOUTH ONCE DAILY 30 MINTUES BEFORE BREAKFAST. Patient taking differently: Take 40 mg by mouth daily. 05/23/20  Yes Carlis Stable, NP  PARoxetine (PAXIL) 40 MG tablet Take 1 tablet (40 mg total) by mouth daily. 02/20/21  Yes Norman Clay, MD  QUEtiapine (SEROQUEL) 100 MG tablet Take 1 tablet (100 mg total) by mouth at bedtime. 02/20/21  Yes Hisada, Elie Goody, MD  rosuvastatin (CRESTOR) 20 MG tablet Take 20 mg by mouth every evening.   Yes [provider]  senna-docusate (SENOKOT-S) 8.6-50 MG tablet Take 1 tablet by mouth at bedtime as needed for mild constipation. 09/04/21  Yes Johnson, Clanford L, MD  sertraline (ZOLOFT) 25 MG tablet Take 25 mg by mouth daily.   Yes [provider]  Sodium Phosphates (RA SALINE ENEMA RE) Place 1 Dose rectally once as needed (severe constipation). If no relief from both milk of magnesia, and Bisacodyl suppository, administer one dose of disposable saline enema.   Yes [provider]  vitamin B-12 (CYANOCOBALAMIN) 500 MCG tablet Take 500 mcg by mouth daily.   Yes [provider]      Allergies    Patient has no known allergies.    Review of Systems   Review of Systems  Constitutional:  Negative for chills and fever.  HENT:  Negative for facial swelling and trouble swallowing.   Eyes:  Negative for photophobia and visual disturbance.  Respiratory:  Negative for cough and shortness of breath.   Cardiovascular:  Negative for chest pain and palpitations.  Gastrointestinal:  Positive for nausea. Negative for abdominal pain and vomiting.  Endocrine: Negative for  polydipsia and polyuria.  Genitourinary:  Negative for difficulty urinating and hematuria.  Musculoskeletal:  Negative for gait problem and joint swelling.  Skin:  Negative for pallor and rash.  Neurological:  Positive for light-headedness. Negative for syncope and headaches.  Psychiatric/Behavioral:  Negative for agitation and confusion.    Physical Exam Updated Vital Signs BP 123/78    Pulse 99    Temp 98.7 F (37.1 C) (Oral)    Resp 18    Ht 5\' 4"  (1.626 m)    Wt 62 kg    SpO2 95%    BMI 23.46 kg/m  Physical Exam Vitals and nursing note reviewed.  Constitutional:      General: She is not in acute distress.    Appearance: Normal appearance.  HENT:     Head: Normocephalic.     Comments: Frontal left hematoma    Right Ear: External ear normal.     Left Ear: External ear normal.     Nose: Nose normal.     Mouth/Throat:     Mouth: Mucous membranes are moist.  Eyes:     General: No scleral icterus.       Right eye: No discharge.        Left eye: No discharge.     Extraocular Movements: Extraocular movements intact.     Pupils: Pupils are equal, round, and reactive to light.  Neck:     Comments: C-spine precautions.  No midline spinous process tenderness palpation percussion, no crepitus or step-off. Cardiovascular:     Rate and Rhythm: Normal rate and regular rhythm.     Pulses: Normal pulses.     Heart sounds: Normal heart sounds.  Pulmonary:     Effort: Pulmonary effort is normal. No respiratory distress.     Breath sounds: Normal breath sounds.  Chest:  Comments: Chest wall abrasion Abdominal:     General: Abdomen is flat.     Tenderness: There is no abdominal tenderness.  Musculoskeletal:        General: Normal range of motion.     Cervical back: Normal range of motion.     Right lower leg: No edema.     Left lower leg: No edema.     Comments: Left forearm abrasion  Skin:    General: Skin is warm and dry.     Capillary Refill: Capillary refill takes less than  2 seconds.  Neurological:     Mental Status: She is alert and oriented to person, place, and time.     GCS: GCS eye subscore is 4. GCS verbal subscore is 5. GCS motor subscore is 6.     Cranial Nerves: Cranial nerves 2-12 are intact.     Sensory: Sensation is intact.     Motor: Weakness present.     Coordination: Coordination is intact.     Comments: Tremulous left upper extremity, reduced strength left upper extremity, reduced range of motion left upper extremity; patient feels this is chronic.  Gait not tested secondary to C-spine precautions  Psychiatric:        Mood and Affect: Mood normal.        Behavior: Behavior normal.    ED Results / Procedures / Treatments   Labs (all labs ordered are listed, but only abnormal results are displayed) Labs Reviewed  CBC WITH DIFFERENTIAL/PLATELET - Abnormal; Notable for the following components:      Result Value   RBC 3.76 (*)    RDW 18.1 (*)    Abs Immature Granulocytes 0.08 (*)    All other components within normal limits  COMPREHENSIVE METABOLIC PANEL - Abnormal; Notable for the following components:   Sodium 134 (*)    Glucose, Bld 229 (*)    Total Protein 5.8 (*)    Albumin 2.6 (*)    All other components within normal limits  CK - Abnormal; Notable for the following components:   Total CK 25 (*)    All other components within normal limits  RESP PANEL BY RT-PCR (FLU A&B, COVID) ARPGX2  LIPASE, BLOOD  TROPONIN I (HIGH SENSITIVITY)  TROPONIN I (HIGH SENSITIVITY)    EKG EKG Interpretation  Date/Time:  Wednesday December 02 2021 12:01:37 EST Ventricular Rate:  94 PR Interval:  155 QRS Duration: 70 QT Interval:  336 QTC Calculation: 421 R Axis:   30 Text Interpretation: Sinus rhythm Nonspecific T abnormalities, lateral leads Confirmed by Wynona Dove ([id number]) on 12/02/2021 3:16:47 PM  Radiology CT Head Wo Contrast  Result Date: 12/02/2021 CLINICAL DATA:  Head trauma following un witnessed fall in a 71 year old female. EXAM:  CT HEAD WITHOUT CONTRAST CT MAXILLOFACIAL WITHOUT CONTRAST CT CERVICAL SPINE WITHOUT CONTRAST TECHNIQUE: Multidetector CT imaging of the head, cervical spine, and maxillofacial structures were performed using the standard protocol without intravenous contrast. Multiplanar CT image reconstructions of the cervical spine and maxillofacial structures were also generated. RADIATION DOSE REDUCTION: This exam was performed according to the departmental dose-optimization program which includes automated exposure control, adjustment of the mA and/or kV according to patient size and/or use of iterative reconstruction technique. COMPARISON:  November 28, 2021. FINDINGS: CT HEAD FINDINGS Brain: No evidence of acute infarction, hemorrhage, hydrocephalus, extra-axial collection or mass lesion/mass effect. Signs of atrophy and chronic microvascular ischemic change as before. Vascular: No hyperdense vessel or unexpected calcification. Skull: Normal. Negative for fracture or focal  lesion. Other: Small scalp hematoma overlying the LEFT frontal region. CT MAXILLOFACIAL FINDINGS Osseous: No fracture or mandibular dislocation. No destructive process. Orbits: Negative. No traumatic or inflammatory finding. Sinuses: Clear. Soft tissues: Mild stranding over the LEFT frontal region better visualized on the concurrent CT which is reported separately. CT CERVICAL SPINE FINDINGS Alignment: Straightening of normal cervical lordotic curvature with mild reversal of normal cervical lordotic curvature in the midcervical spine which is similar to prior imaging and seen in the setting of degenerative changes. Skull base and vertebrae: No acute fracture. No primary bone lesion or focal pathologic process. Soft tissues and spinal canal: No prevertebral fluid or swelling. No visible canal hematoma. Disc levels: Multilevel degenerative changes involving disc spaces, uncovertebral spurring and facet arthropathy as before. Cervical spinal canal narrowing  due to degenerative changes in the mid and lower cervical spine as before. Upper chest: Negative. Other: None IMPRESSION: 1. No acute intracranial abnormality. 2. Small scalp hematoma overlying the LEFT frontal region without underlying skull fracture. 3. No evidence of acute facial bone fracture. 4. No evidence of acute fracture or subluxation of the cervical spine. 5. Marked multilevel degenerative changes involving the cervical spine as before. Electronically Signed   By: Zetta Bills M.D.   On: 12/02/2021 12:56   CT Cervical Spine Wo Contrast  Result Date: 12/02/2021 CLINICAL DATA:  Head trauma following un witnessed fall in a 71 year old female. EXAM: CT HEAD WITHOUT CONTRAST CT MAXILLOFACIAL WITHOUT CONTRAST CT CERVICAL SPINE WITHOUT CONTRAST TECHNIQUE: Multidetector CT imaging of the head, cervical spine, and maxillofacial structures were performed using the standard protocol without intravenous contrast. Multiplanar CT image reconstructions of the cervical spine and maxillofacial structures were also generated. RADIATION DOSE REDUCTION: This exam was performed according to the departmental dose-optimization program which includes automated exposure control, adjustment of the mA and/or kV according to patient size and/or use of iterative reconstruction technique. COMPARISON:  November 28, 2021. FINDINGS: CT HEAD FINDINGS Brain: No evidence of acute infarction, hemorrhage, hydrocephalus, extra-axial collection or mass lesion/mass effect. Signs of atrophy and chronic microvascular ischemic change as before. Vascular: No hyperdense vessel or unexpected calcification. Skull: Normal. Negative for fracture or focal lesion. Other: Small scalp hematoma overlying the LEFT frontal region. CT MAXILLOFACIAL FINDINGS Osseous: No fracture or mandibular dislocation. No destructive process. Orbits: Negative. No traumatic or inflammatory finding. Sinuses: Clear. Soft tissues: Mild stranding over the LEFT frontal region  better visualized on the concurrent CT which is reported separately. CT CERVICAL SPINE FINDINGS Alignment: Straightening of normal cervical lordotic curvature with mild reversal of normal cervical lordotic curvature in the midcervical spine which is similar to prior imaging and seen in the setting of degenerative changes. Skull base and vertebrae: No acute fracture. No primary bone lesion or focal pathologic process. Soft tissues and spinal canal: No prevertebral fluid or swelling. No visible canal hematoma. Disc levels: Multilevel degenerative changes involving disc spaces, uncovertebral spurring and facet arthropathy as before. Cervical spinal canal narrowing due to degenerative changes in the mid and lower cervical spine as before. Upper chest: Negative. Other: None IMPRESSION: 1. No acute intracranial abnormality. 2. Small scalp hematoma overlying the LEFT frontal region without underlying skull fracture. 3. No evidence of acute facial bone fracture. 4. No evidence of acute fracture or subluxation of the cervical spine. 5. Marked multilevel degenerative changes involving the cervical spine as before. Electronically Signed   By: Zetta Bills M.D.   On: 12/02/2021 12:56   DG Chest Portable 1 View  Result Date:  12/02/2021 CLINICAL DATA:  Left breast abrasion following a fall today. EXAM: PORTABLE CHEST 1 VIEW COMPARISON:  11/04/2021 FINDINGS: Mildly enlarged cardiac silhouette with an interval significant decrease in size. Mildly tortuous and partially calcified thoracic aorta. Clear lungs. No fracture or pneumothorax seen. Mild lower thoracic spine degenerative changes. IMPRESSION: No acute abnormality. Electronically Signed   By: Claudie Revering M.D.   On: 12/02/2021 12:12   DG Humerus Left  Result Date: 12/02/2021 CLINICAL DATA:  Fall EXAM: LEFT HUMERUS - 2+ VIEW COMPARISON:  04/17/2004 FINDINGS: There is no evidence of fracture or other focal bone lesions. Soft tissues are unremarkable. IMPRESSION:  Negative. Electronically Signed   By: Franchot Gallo M.D.   On: 12/02/2021 12:43   CT Maxillofacial Wo Contrast  Result Date: 12/02/2021 CLINICAL DATA:  Head trauma following un witnessed fall in a 71 year old female. EXAM: CT HEAD WITHOUT CONTRAST CT MAXILLOFACIAL WITHOUT CONTRAST CT CERVICAL SPINE WITHOUT CONTRAST TECHNIQUE: Multidetector CT imaging of the head, cervical spine, and maxillofacial structures were performed using the standard protocol without intravenous contrast. Multiplanar CT image reconstructions of the cervical spine and maxillofacial structures were also generated. RADIATION DOSE REDUCTION: This exam was performed according to the departmental dose-optimization program which includes automated exposure control, adjustment of the mA and/or kV according to patient size and/or use of iterative reconstruction technique. COMPARISON:  November 28, 2021. FINDINGS: CT HEAD FINDINGS Brain: No evidence of acute infarction, hemorrhage, hydrocephalus, extra-axial collection or mass lesion/mass effect. Signs of atrophy and chronic microvascular ischemic change as before. Vascular: No hyperdense vessel or unexpected calcification. Skull: Normal. Negative for fracture or focal lesion. Other: Small scalp hematoma overlying the LEFT frontal region. CT MAXILLOFACIAL FINDINGS Osseous: No fracture or mandibular dislocation. No destructive process. Orbits: Negative. No traumatic or inflammatory finding. Sinuses: Clear. Soft tissues: Mild stranding over the LEFT frontal region better visualized on the concurrent CT which is reported separately. CT CERVICAL SPINE FINDINGS Alignment: Straightening of normal cervical lordotic curvature with mild reversal of normal cervical lordotic curvature in the midcervical spine which is similar to prior imaging and seen in the setting of degenerative changes. Skull base and vertebrae: No acute fracture. No primary bone lesion or focal pathologic process. Soft tissues and spinal  canal: No prevertebral fluid or swelling. No visible canal hematoma. Disc levels: Multilevel degenerative changes involving disc spaces, uncovertebral spurring and facet arthropathy as before. Cervical spinal canal narrowing due to degenerative changes in the mid and lower cervical spine as before. Upper chest: Negative. Other: None IMPRESSION: 1. No acute intracranial abnormality. 2. Small scalp hematoma overlying the LEFT frontal region without underlying skull fracture. 3. No evidence of acute facial bone fracture. 4. No evidence of acute fracture or subluxation of the cervical spine. 5. Marked multilevel degenerative changes involving the cervical spine as before. Electronically Signed   By: Zetta Bills M.D.   On: 12/02/2021 12:56    Procedures Procedures    Medications Ordered in ED Medications - No data to display  ED Course/ Medical Decision Making/ A&P                           Medical Decision Making Amount and/or Complexity of Data Reviewed Independent Historian: caregiver External Data Reviewed: labs and radiology. Labs: ordered. Decision-making details documented in ED Course. Radiology: ordered and independent interpretation performed. Decision-making details documented in ED Course. ECG/medicine tests: ordered and independent interpretation performed. Decision-making details documented in ED Course.  CC: fall, nausea, light headed   This patient presents to the Emergency Department for the above complaint. This involves an extensive number of treatment options and is a complaint that carries with it a high risk of complications and morbidity. Vital signs were reviewed. Serious etiologies considered.   Record review:  Previous records obtained and reviewed   Additional history obtained from EMS. Call placed to son and message left. >>> Discussed with patient's son over telephone, reports chronic left-sided deficit from prior stroke.  Left-sided arm weakness.  Patient  with dementia, she frequently attempts to get up out of her chair and will fall.  Patient resides at nursing facility.  No thinners.   Medical and surgical history as noted above.     Work up as above, notable for:   Labs & imaging results that were available during my care of the patient were reviewed by me and considered in my medical decision making.   I ordered imaging studies which included CT H, CT cspine, CT face, XR chest, XR humerus and I independently visualized and interpreted imaging which showed no acute process.  Cardiac monitoring reviewed and interpreted personally which shows Sinus tachycardia  Social determinants of health include -poor historian, nursing home resident   C-collar removed, cleared appropriately. Neuro exam stable. Left sided weakness is chronic per family.   Patient is tolerating p.o.  Has no acute complaints this time.  Asking when she can go home because she has to tend to her dog.  Patient with likely mechanical fall.  She has history dementia, nursing home.  After evaluation this time is stable.  EKG w/o acute ischemia or arrhythmia, no brugada or WPW. Trop negative, no chest pain. Labs stable, no seizure like activity was reported by EMS. IF labs remain stable and if on re-assessment she is comfortable would favor discharge back to nursing facility.   This chart was dictated using voice recognition software.  Despite best efforts to proofread,  errors can occur which can change the documentation meaning.         Final Clinical Impression(s) / ED Diagnoses Final diagnoses:  Fall, initial encounter  Contusion of face, initial encounter    Rx / DC Orders ED Discharge Orders     None         Jeanell Sparrow, DO 12/02/21 1541

## 2021-12-02 NOTE — Discharge Instructions (Addendum)
It was a pleasure caring for you today in the emergency department.  Your blood-tests were reassuring overall and did not show signs of medical emergency that required hospitalization at this time.  Your CT scans did not show signs of brain bleed or skull fracture.  Please return to the emergency department for any worsening or worrisome symptoms.

## 2021-12-02 NOTE — ED Triage Notes (Signed)
Pt arrives via EMS from [place] with unwitnessed fall today. Pt has hematoma to left forehead and abrasion to left breast. C-collar in place.

## 2021-12-03 ENCOUNTER — Encounter: Payer: Self-pay | Admitting: Adult Health

## 2021-12-03 ENCOUNTER — Non-Acute Institutional Stay (SKILLED_NURSING_FACILITY): Payer: Medicare Other | Admitting: Adult Health

## 2021-12-03 DIAGNOSIS — Z8673 Personal history of transient ischemic attack (TIA), and cerebral infarction without residual deficits: Secondary | ICD-10-CM | POA: Diagnosis not present

## 2021-12-03 DIAGNOSIS — R296 Repeated falls: Secondary | ICD-10-CM

## 2021-12-03 DIAGNOSIS — I5032 Chronic diastolic (congestive) heart failure: Secondary | ICD-10-CM | POA: Diagnosis not present

## 2021-12-03 DIAGNOSIS — Z794 Long term (current) use of insulin: Secondary | ICD-10-CM

## 2021-12-03 DIAGNOSIS — I480 Paroxysmal atrial fibrillation: Secondary | ICD-10-CM | POA: Diagnosis not present

## 2021-12-03 DIAGNOSIS — N1831 Chronic kidney disease, stage 3a: Secondary | ICD-10-CM

## 2021-12-03 DIAGNOSIS — E1122 Type 2 diabetes mellitus with diabetic chronic kidney disease: Secondary | ICD-10-CM

## 2021-12-03 NOTE — Progress Notes (Signed)
Location:  [name] Room Number: 382-N Place of Service:  SNF (31) Provider:  Durenda Age, DNP, FNP-BC  Patient Care Team: Hendricks Limes, MD as PCP - General (Internal Medicine) Carrolyn Meiers, MD (Internal Medicine) Satira Sark, MD (Cardiology) [name], MD (Gastroenterology) [name], NP as Nurse Practitioner (Internal Medicine)  Extended Emergency Contact Information Primary Emergency Contact: [facility] Address: [address]          [place],  [address] [name] of [place] Phone: [phone] Relation: Son Secondary Emergency Contact: [place] Phone: [phone] Relation: Other  Code Status:  FULL CODE  Goals of care: Advanced Directive information Advanced Directives 12/03/2021  Does Patient Have a Medical Advance Directive? Yes  Type of Paramedic of [place];Living will  Does patient want to make changes to medical advance directive? No - Patient declined  Copy of [place] in Chart? Yes - validated most recent copy scanned in chart (See row information)  Would patient like information on creating a medical advance directive? -  Pre-existing out of facility DNR order (yellow form or pink MOST form) -  Some encounter information is confidential and restricted. Go to Review Flowsheets activity to see all data.     Chief Complaint  Patient presents with   Acute Visit    [place] ED visit.    HPI:  Pt is a 71 y.o. female seen today to follow up post ED visit on 12/02/21. She has a PMH of COPD, hypertension, hyperlipidemia and type 2 diabetes mellitus. She was transferred to ED S/P fall sustaining a left forehead hematoma. CT head, CT C-spine, CT face, chest x-ray and humerus x-ray showed no acute process. She is not on any anticoagulant. She was sent back to [facility] with no new orders. Of note, she had a fall on 11/28/21 and was sent to  ED. Head CT and a CT of cervical spine which showed no acute abnormalities. She was sent back to facility without new orders.  She was admitted to [facility] on 11/10/21 post hospital admission 11/04/21  to 08/01/25 for metabolic encephalopathy secondary to UTI. She had pericardial effusion for which she had pericardiotomy and removed 420 ml bloody fluid. Due to hemorrhagic pericardial effusion, anticoagulation was held and was ordered to be held X 5 more days. Anticoagulation was never re-started.  Past Medical History:  Diagnosis Date   Arthritis    COPD (chronic obstructive pulmonary disease) ([place])    Diverticulosis    Essential hypertension, benign    Hx of colonic polyps    Hypothyroidism    IBS (irritable bowel syndrome)    Ketoacidosis    Mixed hyperlipidemia    PTSD (post-traumatic stress disorder)    S/P colonoscopy 11/2009   RMR: pancolonic diverticula, polypoid rectal mucosa with  prominent lymphoid aggregates, repeat in Jan 2016    Stress incontinence, female    Tobacco use    Type 2 diabetes mellitus ([place])    Past Surgical History:  Procedure Laterality Date   Bilateral tubal ligation     BREAST BIOPSY Left 10/16/2014   negative   CHOLECYSTECTOMY  09/11/2012   Procedure: LAPAROSCOPIC CHOLECYSTECTOMY;  Surgeon: Jamesetta So, MD;  Location: [facility] ORS;  Service: General;  Laterality: N/A;   COLONOSCOPY  11/17/2009   Dr. Gala Romney: normal rectum, pancolonic diverticula, polyps benign, no adenomas.    COLONOSCOPY N/A 02/12/2015   Dr. Gala Romney: colonic diverticulosis, multiple colonic polyps (tubular adenomas),  negative segmental biopsies. Surveillance in 2021   ESOPHAGOGASTRODUODENOSCOPY  11/23/2011   Dr. Gala Romney: Erosive reflux esophagitis; small hiatal hernia; esophagus dilated empirically   MALONEY DILATION  11/23/2011   Procedure: Venia Minks DILATION;  Surgeon: Daneil Dolin, MD;  Location: [facility] END[facility] SUITE;  Service: Endoscopy;  Laterality: N/A;   PERICARDIOCENTESIS N/A 11/06/2021    Procedure: PERICARDIOCENTESIS;  Surgeon: Jettie Booze, MD;  Location: [facility] LAB;  Service: Cardiovascular;  Laterality: N/A;   SKIN LESION EXCISION     on buttocks   TUBAL LIGATION      No Known Allergies  Outpatient Encounter Medications as of 12/03/2021  Medication Sig   albuterol (VENTOLIN HFA) 108 (90 Base) MCG/ACT inhaler Inhale 2 puffs into the lungs every 4 (four) hours as needed for wheezing or shortness of breath.   allopurinol (ZYLOPRIM) 100 MG tablet Take 50 mg by mouth daily. Idiopathic gout   aspirin 81 MG chewable tablet Chew 81 mg by mouth daily.   bisacodyl (DULCOLAX) 10 MG suppository Place 10 mg rectally daily as needed (constipation not relieved by MOM).   Calcium Carb-Cholecalciferol (OYSTER SHELL CALCIUM W/D) 500-200 MG-UNIT TABS Take 1 tablet by mouth 2 (two) times daily.   colchicine 0.6 MG tablet Take 1 tablet (0.6 mg total) by mouth daily.   colestipol (COLESTID) 1 g tablet TAKE 2 TABLETS DAILY FOR DIARRHEA. DO NOT TAKE WITHIN 2 HOURS OF OTHER ORAL MEDICATIONS. HOLD FOR CONSTIPATION.   diltiazem (CARDIZEM CD) 180 MG 24 hr capsule Take 180 mg by mouth daily.   divalproex (DEPAKOTE ER) 500 MG 24 hr tablet Take 2 tablets (1,000 mg total) by mouth every evening.   divalproex (DEPAKOTE) 250 MG DR tablet Take 1 tablet (250 mg total) by mouth daily.   furosemide (LASIX) 20 MG tablet Take 1 tablet (20 mg total) by mouth every other day.   insulin detemir (LEVEMIR) 100 UNIT/ML injection Inject 0.12 mLs (12 Units total) into the skin 2 (two) times daily.   levothyroxine (SYNTHROID) 200 MCG tablet Take 200 mcg by mouth daily before breakfast.   magnesium hydroxide (MILK OF MAGNESIA) 400 MG/5ML suspension Take 30 mLs by mouth daily as needed (if no BM in 3 days).   mirtazapine (REMERON) 15 MG tablet Take 15 mg by mouth at bedtime.   Nutritional Supplements (NUTRITIONAL SUPPLEMENT PO) Take 1 each by mouth daily. Magic Cup   ondansetron (ZOFRAN) 4 MG tablet Take 1  tablet (4 mg total) by mouth every 8 (eight) hours as needed for nausea or vomiting.   pantoprazole (PROTONIX) 40 MG tablet TAKE 1 TABLET BY MOUTH ONCE DAILY 30 MINTUES BEFORE BREAKFAST.   PARoxetine (PAXIL) 40 MG tablet Take 1 tablet (40 mg total) by mouth daily.   QUEtiapine (SEROQUEL) 100 MG tablet Take 1 tablet (100 mg total) by mouth at bedtime.   rosuvastatin (CRESTOR) 20 MG tablet Take 20 mg by mouth every evening.   senna-docusate (SENOKOT-S) 8.6-50 MG tablet Take 1 tablet by mouth at bedtime as needed for mild constipation.   sertraline (ZOLOFT) 25 MG tablet Take 25 mg by mouth daily.   Sodium Phosphates (RA SALINE ENEMA RE) Place 1 Dose rectally once as needed (severe constipation). If no relief from both milk of magnesia, and Bisacodyl suppository, administer one dose of disposable saline enema.   vitamin B-12 (CYANOCOBALAMIN) 500 MCG tablet Take 500 mcg by mouth daily.   [DISCONTINUED] acetaminophen (TYLENOL) 325 MG tablet Take 650 mg by mouth every 6 (six) hours as needed  for mild pain.   No facility-administered encounter medications on file as of 12/03/2021.    Review of Systems   Unable to obtain due to severe cognitive impairment.    Immunization History  Administered Date(s) Administered   Fluad Quad(high Dose 65+) 07/09/2021   Moderna Sars-Covid-2 Vaccination 11/16/2019, 12/10/2019   Tdap 07/09/2021   Zoster Recombinat (Shingrix) 07/09/2021   Pertinent  Health Maintenance Due  Topic Date Due   OPHTHALMOLOGY EXAM  Never done   HEMOGLOBIN A1C  05/06/2022   FOOT EXAM  08/04/2022   URINE MICROALBUMIN  11/20/2022   MAMMOGRAM  10/15/2023   COLONOSCOPY (Pts 45-72yrs Insurance coverage will need to be confirmed)  02/11/2025   INFLUENZA VACCINE  Completed   DEXA SCAN  Completed   Fall Risk 11/08/2021 11/09/2021 11/10/2021 11/28/2021 12/02/2021  Falls in the past year? - - - - -  Number of falls in past year - - - - -  Was there an injury with Fall? - - - - -  Fall Risk  Category Calculator - - - - -  Fall Risk Category - - - - -  Patient Fall Risk Level High fall risk High fall risk High fall risk High fall risk High fall risk  Patient at Risk for Falls Due to - - - - -  Fall risk Follow up - - - - -     Vitals:   12/03/21 0946  BP: 130/70  Pulse: (!) 119  Resp: 18  Temp: (!) 97.3 F (36.3 C)  Weight: 137 lb 12.8 oz (62.5 kg)  Height: 5\' 4"  (1.626 m)   Body mass index is 23.65 kg/m.  Physical Exam Constitutional:      General: She is not in acute distress. HENT:     Head: Normocephalic and atraumatic.     Nose: Nose normal.     Mouth/Throat:     Mouth: Mucous membranes are moist.  Eyes:     Conjunctiva/sclera: Conjunctivae normal.  Cardiovascular:     Rate and Rhythm: Normal rate. Rhythm irregular.  Pulmonary:     Effort: Pulmonary effort is normal.     Breath sounds: Normal breath sounds.  Abdominal:     General: Bowel sounds are normal.     Palpations: Abdomen is soft.  Musculoskeletal:        General: Normal range of motion.  Skin:    General: Skin is warm and dry.     Comments: Left forehead with greenish hematoma.  Neurological:     General: No focal deficit present.     Mental Status: She is alert.     Comments: Alert to self, disoriented to time and place. Left-sided weakness.  Psychiatric:        Mood and Affect: Mood normal.       Labs reviewed: Recent Labs    12/11/20 0643 12/12/20 0416 09/02/21 2018 11/05/21 0425 11/06/21 0319 12/02/21 1208  NA 141 140   < > 133* 136 134*  K 3.6 3.8   < > 4.6 4.1 4.4  CL 102 102   < > 96* 97* 101  CO2 31 29   < > 26 31 26   GLUCOSE 74 119*   < > 210* 172* 229*  BUN 30* 34*   < > 54* 44* 18  CREATININE 0.74 0.75   < > 1.05* 0.99 0.83  CALCIUM 9.5 9.6   < > 9.1 10.3 9.5  MG 1.7 1.5*  --   --  1.7  --  PHOS 2.9 2.9  --   --  2.6  --    < > = values in this interval not displayed.   Recent Labs    11/04/21 1959 11/05/21 0425 12/02/21 1208  AST 42* 44* 16  ALT  25 27 12   ALKPHOS 70 61 59  BILITOT 0.7 1.1 0.5  PROT 6.2* 5.5* 5.8*  ALBUMIN 2.4* 2.2* 2.6*   Recent Labs    11/04/21 1959 11/04/21 2006 11/05/21 0425 11/06/21 0319 12/02/21 1208  WBC 7.5  --  7.6 7.1 7.4  NEUTROABS 5.0  --   --  3.6 5.3  HGB 12.6   < > 10.7* 11.3* 12.3  HCT 38.1   < > 32.3* 33.3* 37.4  MCV 95.3  --  96.1 92.8 99.5  PLT 290  --  272 264 169   < > = values in this interval not displayed.   Lab Results  Component Value Date   TSH 0.621 11/05/2021   Lab Results  Component Value Date   HGBA1C 7.8 (H) 11/05/2021   Lab Results  Component Value Date   CHOL 275 (H) 09/03/2021   HDL 39 (L) 09/03/2021   LDLCALC 202 (H) 09/03/2021   TRIG 169 (H) 09/03/2021   CHOLHDL 7.1 09/03/2021    Significant Diagnostic Results in last 30 days:  DG Chest 1 View  Result Date: 11/04/2021 CLINICAL DATA:  Altered mental status. EXAM: CHEST  1 VIEW COMPARISON:  September 02, 2021 FINDINGS: Mild to moderate severity infiltrate is seen within the left lung base. There is no evidence of a pleural effusion or pneumothorax. There is moderate to marked severity enlargement of the cardiac silhouette. This is increased in size when compared to the prior study, which may be technical in nature. Moderate to marked severity calcification of the aortic arch is noted. The visualized skeletal structures are unremarkable. IMPRESSION: Moderate to marked severity cardiomegaly with mild to moderate severity left basilar infiltrate. Electronically Signed   By: Virgina Norfolk M.D.   On: 11/04/2021 20:12   CT Head Wo Contrast  Result Date: 12/02/2021 CLINICAL DATA:  Head trauma following un witnessed fall in a 71 year old female. EXAM: CT HEAD WITHOUT CONTRAST CT MAXILLOFACIAL WITHOUT CONTRAST CT CERVICAL SPINE WITHOUT CONTRAST TECHNIQUE: Multidetector CT imaging of the head, cervical spine, and maxillofacial structures were performed using the standard protocol without intravenous contrast.  Multiplanar CT image reconstructions of the cervical spine and maxillofacial structures were also generated. RADIATION DOSE REDUCTION: This exam was performed according to the departmental dose-optimization program which includes automated exposure control, adjustment of the mA and/or kV according to patient size and/or use of iterative reconstruction technique. COMPARISON:  November 28, 2021. FINDINGS: CT HEAD FINDINGS Brain: No evidence of acute infarction, hemorrhage, hydrocephalus, extra-axial collection or mass lesion/mass effect. Signs of atrophy and chronic microvascular ischemic change as before. Vascular: No hyperdense vessel or unexpected calcification. Skull: Normal. Negative for fracture or focal lesion. Other: Small scalp hematoma overlying the LEFT frontal region. CT MAXILLOFACIAL FINDINGS Osseous: No fracture or mandibular dislocation. No destructive process. Orbits: Negative. No traumatic or inflammatory finding. Sinuses: Clear. Soft tissues: Mild stranding over the LEFT frontal region better visualized on the concurrent CT which is reported separately. CT CERVICAL SPINE FINDINGS Alignment: Straightening of normal cervical lordotic curvature with mild reversal of normal cervical lordotic curvature in the midcervical spine which is similar to prior imaging and seen in the setting of degenerative changes. Skull base and vertebrae: No acute fracture. No primary bone  lesion or focal pathologic process. Soft tissues and spinal canal: No prevertebral fluid or swelling. No visible canal hematoma. Disc levels: Multilevel degenerative changes involving disc spaces, uncovertebral spurring and facet arthropathy as before. Cervical spinal canal narrowing due to degenerative changes in the mid and lower cervical spine as before. Upper chest: Negative. Other: None IMPRESSION: 1. No acute intracranial abnormality. 2. Small scalp hematoma overlying the LEFT frontal region without underlying skull fracture. 3. No  evidence of acute facial bone fracture. 4. No evidence of acute fracture or subluxation of the cervical spine. 5. Marked multilevel degenerative changes involving the cervical spine as before. Electronically Signed   By: Zetta Bills M.D.   On: 12/02/2021 12:56   CT Head Wo Contrast  Result Date: 11/28/2021 CLINICAL DATA:  Trauma. EXAM: CT HEAD WITHOUT CONTRAST CT CERVICAL SPINE WITHOUT CONTRAST TECHNIQUE: Multidetector CT imaging of the head and cervical spine was performed following the standard protocol without intravenous contrast. Multiplanar CT image reconstructions of the cervical spine were also generated. RADIATION DOSE REDUCTION: This exam was performed according to the departmental dose-optimization program which includes automated exposure control, adjustment of the mA and/or kV according to patient size and/or use of iterative reconstruction technique. COMPARISON:  Head CT dated 11/04/2021. FINDINGS: CT HEAD FINDINGS Brain: Mild age-related atrophy and chronic microvascular ischemic changes. Small old right internal capsule lacunar infarct. There is no acute intracranial hemorrhage. No mass effect or midline shift. No extra-axial fluid collection. Vascular: No hyperdense vessel or unexpected calcification. Skull: Normal. Negative for fracture or focal lesion. Sinuses/Orbits: No acute finding. Other: None CT CERVICAL SPINE FINDINGS Alignment: No acute subluxation. There is mild reversal of normal cervical lordosis which may be positional or due to muscle spasm. Skull base and vertebrae: No acute fracture Soft tissues and spinal canal: No prevertebral fluid or swelling. No visible canal hematoma. Disc levels:  No acute findings.  Multilevel degenerative changes. Upper chest: Negative. Other: Bilateral carotid bulb calcified plaques. IMPRESSION: 1. No acute intracranial pathology. Mild age-related atrophy and chronic microvascular ischemic changes. 2. No acute cervical spine fracture or subluxation.  Electronically Signed   By: Anner Crete M.D.   On: 11/28/2021 21:45   CT HEAD WO CONTRAST (5MM)  Result Date: 11/04/2021 CLINICAL DATA:  Altered level of consciousness for 2 days EXAM: CT HEAD WITHOUT CONTRAST TECHNIQUE: Contiguous axial images were obtained from the base of the skull through the vertex without intravenous contrast. COMPARISON:  09/03/2021, 09/02/2021 FINDINGS: Brain: Chronic ischemic changes are seen within the bilateral basal ganglia, right greater than left. No evidence of acute infarct or hemorrhage. Lateral ventricles and remaining midline structures are unremarkable. No acute extra-axial fluid collections. No mass effect. Vascular: No hyperdense vessel or unexpected calcification. Skull: Normal. Negative for fracture or focal lesion. Sinuses/Orbits: No acute finding. Other: None. IMPRESSION: 1. Chronic small vessel ischemic changes within the basal ganglia. No acute intracranial process. Electronically Signed   By: Randa Ngo M.D.   On: 11/04/2021 19:47   CT Angio Chest PE W and/or Wo Contrast  Result Date: 11/04/2021 CLINICAL DATA:  Hypoxia, abdominal pain EXAM: CT ANGIOGRAPHY CHEST CT ABDOMEN AND PELVIS WITH CONTRAST TECHNIQUE: Multidetector CT imaging of the chest was performed using the standard protocol during bolus administration of intravenous contrast. Multiplanar CT image reconstructions and MIPs were obtained to evaluate the vascular anatomy. Multidetector CT imaging of the abdomen and pelvis was performed using the standard protocol during bolus administration of intravenous contrast. CONTRAST:  163mL OMNIPAQUE IOHEXOL 350 MG/ML  SOLN COMPARISON:  11/04/2021, 03/18/2020 FINDINGS: CTA CHEST FINDINGS Cardiovascular: This is a technically adequate evaluation of the pulmonary vasculature. No filling defects or pulmonary emboli. There is a large pericardial effusion measuring up to 2.8 cm in thickness. There is subtle pericardial enhancement, concerning for exudative  effusion. No evidence of thoracic aortic aneurysm or dissection. Moderate atherosclerosis of the aorta and coronary vasculature. Mediastinum/Nodes: No enlarged mediastinal, hilar, or axillary lymph nodes. Thyroid gland, trachea, and esophagus demonstrate no significant findings. Lungs/Pleura: Small left pleural effusion with left lower lobe compressive atelectasis. Mild bilateral perihilar interstitial and ground-glass opacities consistent with mild edema. No pneumothorax. Central airways are patent. Musculoskeletal: No acute or destructive bony lesions. Reconstructed images demonstrate no additional findings. Review of the MIP images confirms the above findings. CT ABDOMEN and PELVIS FINDINGS Hepatobiliary: Reflux of contrast into the hepatic veins suggests cardiac dysfunction. No focal liver abnormality. Gallbladder is surgically absent. Pancreas: Unremarkable. No pancreatic ductal dilatation or surrounding inflammatory changes. Spleen: Normal in size without focal abnormality. Adrenals/Urinary Tract: Adrenal glands are unremarkable. Kidneys are normal, without renal calculi, focal lesion, or hydronephrosis. Bladder is unremarkable. Stomach/Bowel: No bowel obstruction or ileus. Normal appendix right lower quadrant. Mild wall thickening involving the distal transverse and descending colon is nonspecific given decompressed state, and underlying colitis cannot be excluded. No associated inflammatory change. Vascular/Lymphatic: Significant atherosclerosis of the aorta, with focal high-grade stenosis of the distal aorta just proximal to the bifurcation. Please correlate with any symptoms of claudication. No pathologic adenopathy within the abdomen or pelvis. Reproductive: Uterus and bilateral adnexa are unremarkable. Other: No free fluid or free gas.  No abdominal wall hernia. Musculoskeletal: No acute or destructive bony lesions. Prominent spondylosis at L5-S1. Reconstructed images demonstrate no additional findings.  Review of the MIP images confirms the above findings. IMPRESSION: 1. Large pericardial effusion, with pericardial enhancement concerning for pericarditis and exudative effusion. 2. Mild perihilar edema, small left pleural effusion, and reflux of contrast into the hepatic veins may reflect an element of constrictive pericarditis. Correlation with echocardiography is recommended. 3. No evidence of pulmonary embolus. 4. Nonspecific segmental distal colonic wall thickening. While this could be due to decompressed state, mild colitis cannot be excluded. 5. Aortic Atherosclerosis (ICD10-I70.0). High-grade stenosis at the aortic bifurcation from heavily calcified atheromatous plaque unchanged. Please correlate with any symptoms of lower extremity claudication. Electronically Signed   By: Randa Ngo M.D.   On: 11/04/2021 23:15   CT Cervical Spine Wo Contrast  Result Date: 12/02/2021 CLINICAL DATA:  Head trauma following un witnessed fall in a 71 year old female. EXAM: CT HEAD WITHOUT CONTRAST CT MAXILLOFACIAL WITHOUT CONTRAST CT CERVICAL SPINE WITHOUT CONTRAST TECHNIQUE: Multidetector CT imaging of the head, cervical spine, and maxillofacial structures were performed using the standard protocol without intravenous contrast. Multiplanar CT image reconstructions of the cervical spine and maxillofacial structures were also generated. RADIATION DOSE REDUCTION: This exam was performed according to the departmental dose-optimization program which includes automated exposure control, adjustment of the mA and/or kV according to patient size and/or use of iterative reconstruction technique. COMPARISON:  November 28, 2021. FINDINGS: CT HEAD FINDINGS Brain: No evidence of acute infarction, hemorrhage, hydrocephalus, extra-axial collection or mass lesion/mass effect. Signs of atrophy and chronic microvascular ischemic change as before. Vascular: No hyperdense vessel or unexpected calcification. Skull: Normal. Negative for  fracture or focal lesion. Other: Small scalp hematoma overlying the LEFT frontal region. CT MAXILLOFACIAL FINDINGS Osseous: No fracture or mandibular dislocation. No destructive process. Orbits: Negative. No traumatic or inflammatory finding.  Sinuses: Clear. Soft tissues: Mild stranding over the LEFT frontal region better visualized on the concurrent CT which is reported separately. CT CERVICAL SPINE FINDINGS Alignment: Straightening of normal cervical lordotic curvature with mild reversal of normal cervical lordotic curvature in the midcervical spine which is similar to prior imaging and seen in the setting of degenerative changes. Skull base and vertebrae: No acute fracture. No primary bone lesion or focal pathologic process. Soft tissues and spinal canal: No prevertebral fluid or swelling. No visible canal hematoma. Disc levels: Multilevel degenerative changes involving disc spaces, uncovertebral spurring and facet arthropathy as before. Cervical spinal canal narrowing due to degenerative changes in the mid and lower cervical spine as before. Upper chest: Negative. Other: None IMPRESSION: 1. No acute intracranial abnormality. 2. Small scalp hematoma overlying the LEFT frontal region without underlying skull fracture. 3. No evidence of acute facial bone fracture. 4. No evidence of acute fracture or subluxation of the cervical spine. 5. Marked multilevel degenerative changes involving the cervical spine as before. Electronically Signed   By: Zetta Bills M.D.   On: 12/02/2021 12:56   CT Cervical Spine Wo Contrast  Result Date: 11/28/2021 CLINICAL DATA:  Trauma. EXAM: CT HEAD WITHOUT CONTRAST CT CERVICAL SPINE WITHOUT CONTRAST TECHNIQUE: Multidetector CT imaging of the head and cervical spine was performed following the standard protocol without intravenous contrast. Multiplanar CT image reconstructions of the cervical spine were also generated. RADIATION DOSE REDUCTION: This exam was performed according to  the departmental dose-optimization program which includes automated exposure control, adjustment of the mA and/or kV according to patient size and/or use of iterative reconstruction technique. COMPARISON:  Head CT dated 11/04/2021. FINDINGS: CT HEAD FINDINGS Brain: Mild age-related atrophy and chronic microvascular ischemic changes. Small old right internal capsule lacunar infarct. There is no acute intracranial hemorrhage. No mass effect or midline shift. No extra-axial fluid collection. Vascular: No hyperdense vessel or unexpected calcification. Skull: Normal. Negative for fracture or focal lesion. Sinuses/Orbits: No acute finding. Other: None CT CERVICAL SPINE FINDINGS Alignment: No acute subluxation. There is mild reversal of normal cervical lordosis which may be positional or due to muscle spasm. Skull base and vertebrae: No acute fracture Soft tissues and spinal canal: No prevertebral fluid or swelling. No visible canal hematoma. Disc levels:  No acute findings.  Multilevel degenerative changes. Upper chest: Negative. Other: Bilateral carotid bulb calcified plaques. IMPRESSION: 1. No acute intracranial pathology. Mild age-related atrophy and chronic microvascular ischemic changes. 2. No acute cervical spine fracture or subluxation. Electronically Signed   By: Anner Crete M.D.   On: 11/28/2021 21:45   MR BRAIN WO CONTRAST  Result Date: 11/05/2021 CLINICAL DATA:  Altered mental status, recent history of multiple territory strokes EXAM: MRI HEAD WITHOUT CONTRAST TECHNIQUE: Multiplanar, multiecho pulse sequences of the brain and surrounding structures were obtained without intravenous contrast. COMPARISON:  CT head dated 1 day prior, MR head 09/03/2021 FINDINGS: Brain: There is faintly elevated DWI signal in the posterior limb of the right internal capsule and lentiform nucleus without convincing low ADC signal. There is associated FLAIR signal abnormality. This signal abnormality was present on the  brain MRI from 09/03/2021 and likely reflects late subacute infarct. Curvilinear SWI signal dropout in this region likely reflects petechial blood products. There is an additional faint focus of diffusion restriction in the posterior limb of the left internal capsule with mildly low ADC signal and FLAIR signal abnormality which was not present on the prior brain MRI, likely reflecting more recent early subacute stroke.  There is no evidence of associated hemorrhage. There is no other evidence of acute infarct. There is no evidence of acute intracranial hemorrhage or extra-axial fluid collection. There is unchanged mild global parenchymal volume loss with prominence of the ventricular system and extra-axial CSF spaces. Additional foci of FLAIR signal abnormality in the subcortical and periventricular white matter likely reflect sequela of chronic white matter microangiopathy. A few additional remote lacunar infarcts are unchanged. There is no solid mass lesion.  There is no midline shift. Vascular: Normal flow voids. Skull and upper cervical spine: Normal marrow signal. Sinuses/Orbits: The paranasal sinuses are clear. The globes and orbits are unremarkable. Other: There are bilateral mastoid effusions, unchanged. IMPRESSION: 1. Small infarct in the left internal capsule is new since the prior brain MRI of 09/03/2021, likely subacute in chronicity. 2. Evolving late subacute to early chronic infarcts in the right internal capsule/basal ganglia. 3. No other acute infarct or other acute intracranial pathology. Electronically Signed   By: Valetta Mole M.D.   On: 11/05/2021 12:27   CT ABDOMEN PELVIS W CONTRAST  Result Date: 11/04/2021 CLINICAL DATA:  Hypoxia, abdominal pain EXAM: CT ANGIOGRAPHY CHEST CT ABDOMEN AND PELVIS WITH CONTRAST TECHNIQUE: Multidetector CT imaging of the chest was performed using the standard protocol during bolus administration of intravenous contrast. Multiplanar CT image reconstructions and  MIPs were obtained to evaluate the vascular anatomy. Multidetector CT imaging of the abdomen and pelvis was performed using the standard protocol during bolus administration of intravenous contrast. CONTRAST:  149mL OMNIPAQUE IOHEXOL 350 MG/ML SOLN COMPARISON:  11/04/2021, 03/18/2020 FINDINGS: CTA CHEST FINDINGS Cardiovascular: This is a technically adequate evaluation of the pulmonary vasculature. No filling defects or pulmonary emboli. There is a large pericardial effusion measuring up to 2.8 cm in thickness. There is subtle pericardial enhancement, concerning for exudative effusion. No evidence of thoracic aortic aneurysm or dissection. Moderate atherosclerosis of the aorta and coronary vasculature. Mediastinum/Nodes: No enlarged mediastinal, hilar, or axillary lymph nodes. Thyroid gland, trachea, and esophagus demonstrate no significant findings. Lungs/Pleura: Small left pleural effusion with left lower lobe compressive atelectasis. Mild bilateral perihilar interstitial and ground-glass opacities consistent with mild edema. No pneumothorax. Central airways are patent. Musculoskeletal: No acute or destructive bony lesions. Reconstructed images demonstrate no additional findings. Review of the MIP images confirms the above findings. CT ABDOMEN and PELVIS FINDINGS Hepatobiliary: Reflux of contrast into the hepatic veins suggests cardiac dysfunction. No focal liver abnormality. Gallbladder is surgically absent. Pancreas: Unremarkable. No pancreatic ductal dilatation or surrounding inflammatory changes. Spleen: Normal in size without focal abnormality. Adrenals/Urinary Tract: Adrenal glands are unremarkable. Kidneys are normal, without renal calculi, focal lesion, or hydronephrosis. Bladder is unremarkable. Stomach/Bowel: No bowel obstruction or ileus. Normal appendix right lower quadrant. Mild wall thickening involving the distal transverse and descending colon is nonspecific given decompressed state, and underlying  colitis cannot be excluded. No associated inflammatory change. Vascular/Lymphatic: Significant atherosclerosis of the aorta, with focal high-grade stenosis of the distal aorta just proximal to the bifurcation. Please correlate with any symptoms of claudication. No pathologic adenopathy within the abdomen or pelvis. Reproductive: Uterus and bilateral adnexa are unremarkable. Other: No free fluid or free gas.  No abdominal wall hernia. Musculoskeletal: No acute or destructive bony lesions. Prominent spondylosis at L5-S1. Reconstructed images demonstrate no additional findings. Review of the MIP images confirms the above findings. IMPRESSION: 1. Large pericardial effusion, with pericardial enhancement concerning for pericarditis and exudative effusion. 2. Mild perihilar edema, small left pleural effusion, and reflux of contrast into the  hepatic veins may reflect an element of constrictive pericarditis. Correlation with echocardiography is recommended. 3. No evidence of pulmonary embolus. 4. Nonspecific segmental distal colonic wall thickening. While this could be due to decompressed state, mild colitis cannot be excluded. 5. Aortic Atherosclerosis (ICD10-I70.0). High-grade stenosis at the aortic bifurcation from heavily calcified atheromatous plaque unchanged. Please correlate with any symptoms of lower extremity claudication. Electronically Signed   By: Randa Ngo M.D.   On: 11/04/2021 23:15   CARDIAC CATHETERIZATION  Result Date: 11/10/2021   Successful pericardiocentesis removing 420 cc of bloody fluid. Await cytology results and other laboratory analysis of the pericardial fluid. Consider removing pericardial drain tomorrow depending on the output. Results conveyed to son, Charlotte Crumb.   DG Chest Portable 1 View  Result Date: 12/02/2021 CLINICAL DATA:  Left breast abrasion following a fall today. EXAM: PORTABLE CHEST 1 VIEW COMPARISON:  11/04/2021 FINDINGS: Mildly enlarged cardiac silhouette with an interval  significant decrease in size. Mildly tortuous and partially calcified thoracic aorta. Clear lungs. No fracture or pneumothorax seen. Mild lower thoracic spine degenerative changes. IMPRESSION: No acute abnormality. Electronically Signed   By: Claudie Revering M.D.   On: 12/02/2021 12:12   DG Humerus Left  Result Date: 12/02/2021 CLINICAL DATA:  Fall EXAM: LEFT HUMERUS - 2+ VIEW COMPARISON:  04/17/2004 FINDINGS: There is no evidence of fracture or other focal bone lesions. Soft tissues are unremarkable. IMPRESSION: Negative. Electronically Signed   By: Franchot Gallo M.D.   On: 12/02/2021 12:43   ECHOCARDIOGRAM COMPLETE  Result Date: 11/05/2021    ECHOCARDIOGRAM REPORT   Patient Name:   Claudia Keller Date of Exam: 11/05/2021 Medical Rec #:  [id number]      Height:       63.5 in Accession #:    [id number]     Weight:       135.0 lb Date of Birth:  05-28-1951      BSA:          1.646 m Patient Age:    86 years       BP:           160/88 mmHg Patient Gender: F              HR:           94 bpm. Exam Location:  Inpatient Procedure: 2D Echo, Cardiac Doppler and Color Doppler Indications:    Stroke  History:        Patient has prior history of Echocardiogram examinations, most                 recent 09/03/2021. Stroke; Risk Factors:Former Smoker,                 Hypertension and Diabetes.  Sonographer:    Merrie Roof RDCS Referring Phys: [id number] [place]  1. Left ventricular ejection fraction, by estimation, is 60 to 65%. The left ventricle has normal function. The left ventricle has no regional wall motion abnormalities. Left ventricular diastolic parameters are consistent with Grade I diastolic dysfunction (impaired relaxation).  2. Right ventricular systolic function is normal. The right ventricular size is normal. There is normal pulmonary artery systolic pressure.  3. Large circumferential pericardial effusion. Increased in since since 08/2021. Large pericardial effusion. The pericardial  effusion is circumferential. There is no evidence of cardiac tamponade.  4. The mitral valve is normal in structure. No evidence of mitral valve regurgitation. No evidence of mitral stenosis.  5. The aortic valve  is tricuspid. Aortic valve regurgitation is not visualized. No aortic stenosis is present.  6. The inferior vena cava is normal in size with greater than 50% respiratory variability, suggesting right atrial pressure of 3 mmHg. FINDINGS  Left Ventricle: Left ventricular ejection fraction, by estimation, is 60 to 65%. The left ventricle has normal function. The left ventricle has no regional wall motion abnormalities. The left ventricular internal cavity size was normal in size. There is  no left ventricular hypertrophy. Left ventricular diastolic parameters are consistent with Grade I diastolic dysfunction (impaired relaxation). Right Ventricle: The right ventricular size is normal. No increase in right ventricular wall thickness. Right ventricular systolic function is normal. There is normal pulmonary artery systolic pressure. The tricuspid regurgitant velocity is 1.62 m/s, and  with an assumed right atrial pressure of 3 mmHg, the estimated right ventricular systolic pressure is 54.6 mmHg. Left Atrium: Left atrial size was normal in size. Right Atrium: Right atrial size was normal in size. Pericardium: Large circumferential pericardial effusion. Increased in since since 08/2021. A large pericardial effusion is present. The pericardial effusion is circumferential. There is diastolic collapse of the right ventricular free wall. There is no evidence of cardiac tamponade. Presence of epicardial fat layer. Mitral Valve: The mitral valve is normal in structure. No evidence of mitral valve regurgitation. No evidence of mitral valve stenosis. Tricuspid Valve: The tricuspid valve is normal in structure. Tricuspid valve regurgitation is trivial. No evidence of tricuspid stenosis. Aortic Valve: The aortic valve is  tricuspid. Aortic valve regurgitation is not visualized. No aortic stenosis is present. Aortic valve mean gradient measures 4.0 mmHg. Aortic valve peak gradient measures 6.9 mmHg. Aortic valve area, by VTI measures 2.67 cm. Pulmonic Valve: The pulmonic valve was normal in structure. Pulmonic valve regurgitation is not visualized. No evidence of pulmonic stenosis. Aorta: The aortic root is normal in size and structure. Venous: The inferior vena cava is normal in size with greater than 50% respiratory variability, suggesting right atrial pressure of 3 mmHg. IAS/Shunts: No atrial level shunt detected by color flow Doppler.  LEFT VENTRICLE PLAX 2D LVIDd:         3.60 cm   Diastology LVIDs:         2.40 cm   LV e' medial:    6.76 cm/s LV PW:         0.90 cm   LV E/e' medial:  10.7 LV IVS:        1.10 cm   LV e' lateral:   7.42 cm/s LVOT diam:     1.80 cm   LV E/e' lateral: 9.8 LV SV:         51 LV SV Index:   31 LVOT Area:     2.54 cm  RIGHT VENTRICLE          IVC RV Basal diam:  3.00 cm  IVC diam: 1.50 cm LEFT ATRIUM             Index        RIGHT ATRIUM           Index LA diam:        3.20 cm 1.94 cm/m   RA Area:     16.50 cm LA Vol (A2C):   41.5 ml 25.22 ml/m  RA Volume:   46.40 ml  28.19 ml/m LA Vol (A4C):   43.9 ml 26.67 ml/m LA Biplane Vol: 44.1 ml 26.80 ml/m  AORTIC VALVE AV Area (Vmax):    2.39 cm  AV Area (Vmean):   2.31 cm AV Area (VTI):     2.67 cm AV Vmax:           131.00 cm/s AV Vmean:          90.100 cm/s AV VTI:            0.190 m AV Peak Grad:      6.9 mmHg AV Mean Grad:      4.0 mmHg LVOT Vmax:         123.00 cm/s LVOT Vmean:        81.700 cm/s LVOT VTI:          0.199 m LVOT/AV VTI ratio: 1.05  AORTA Ao Root diam: 2.80 cm Ao Asc diam:  2.90 cm MITRAL VALVE                TRICUSPID VALVE MV Area (PHT): 4.57 cm     TR Peak grad:   10.5 mmHg MV Decel Time: 166 msec     TR Vmax:        162.00 cm/s MV E velocity: 72.60 cm/s MV A velocity: 117.00 cm/s  SHUNTS MV E/A ratio:  0.62         Systemic  VTI:  0.20 m                             Systemic Diam: 1.80 cm Skeet Latch MD Electronically signed by Skeet Latch MD Signature Date/Time: 11/05/2021/4:34:25 PM    Final    ECHOCARDIOGRAM LIMITED  Result Date: 11/19/2021    ECHOCARDIOGRAM LIMITED REPORT   Patient Name:   Claudia Keller Date of Exam: 11/19/2021 Medical Rec #:  [id number]      Height:       63.0 in Accession #:    [id number]     Weight:       139.8 lb Date of Birth:  1951/01/13      BSA:          1.661 m Patient Age:    76 years       BP:           160/80 mmHg Patient Gender: F              HR:           95 bpm. Exam Location:  [address] Procedure: Limited Echo, Cardiac Doppler and Limited Color Doppler Indications:    I31.39 Pericardial effusion. LIMITED follow up pericardial                 effusion.  History:        Patient has prior history of Echocardiogram examinations, most                 recent 11/09/2021. Pericardial effusion. Pericarditis, Stroke and                 COPD, Arrythmias:Atrial Fibrillation; Risk Factors:Diabetes,                 Hypertension, Dyslipidemia, Current Smoker and Obesity.                 COVID-19. CKD stage 3.  Sonographer:    Basilia Jumbo BS, RDCS Referring Phys: [place]  1. Left ventricular ejection fraction, by estimation, is 65 to 70%. The left ventricle has normal function. Comparison(s): 11/09/21 EF 70-75%. Small pericardial effusion. FINDINGS  Left Ventricle: Left  ventricular ejection fraction, by estimation, is 65 to 70%. The left ventricle has normal function. The left ventricular internal cavity size was normal in size. Pericardium: Trivial pericardial effusion is present. Aorta: The aortic root and ascending aorta are structurally normal, with no evidence of dilitation. LEFT VENTRICLE PLAX 2D LVIDd:         3.60 cm Diastology LVIDs:         2.10 cm LV e' medial:    8.27 cm/s LV PW:         0.80 cm LV E/e' medial:  9.7 LV IVS:        0.60 cm LV e' lateral:   8.27 cm/s                         LV E/e' lateral: 9.7  IVC IVC diam: 1.10 cm LEFT ATRIUM         Index       RIGHT ATRIUM LA diam:    2.80 cm 1.69 cm/m  RA Pressure: 3.00 mmHg   AORTA Ao Root diam: 2.90 cm MITRAL VALVE                TRICUSPID VALVE                             Estimated RAP:  3.00 mmHg MV Decel Time: 165 msec MV E velocity: 80.20 cm/s MV A velocity: 112.00 cm/s MV E/A ratio:  0.72 Mertie Moores MD Electronically signed by Mertie Moores MD Signature Date/Time: 11/19/2021/4:58:53 PM    Final    ECHOCARDIOGRAM LIMITED  Result Date: 11/09/2021    ECHOCARDIOGRAM LIMITED REPORT   Patient Name:   MALINDA MAYDEN Date of Exam: 11/09/2021 Medical Rec #:  [id number]      Height:       63.4 in Accession #:    [id number]     Weight:       145.5 lb Date of Birth:  January 07, 1951      BSA:          1.697 m Patient Age:    20 years       BP:           116/56 mmHg Patient Gender: F              HR:           72 bpm. Exam Location:  Inpatient Procedure: Limited Echo and Cardiac Doppler Indications:    Pericardial Effusion I31.3  History:        Patient has prior history of Echocardiogram examinations, most                 recent 11/06/2021. COPD, Arrythmias:Atrial Fibrillation; Risk                 Factors:Hypertension, Diabetes and Dyslipidemia. AMS (altered                 mental status), Pericardial Disease, chronic kidney disease.  Sonographer:    Alvino Chapel RCS Referring Phys: [place]  1. Left ventricular ejection fraction, by estimation, is 70 to 75%. The left ventricle has hyperdynamic function. The left ventricle has no regional wall motion abnormalities.  2. A small pericardial effusion is present. The pericardial effusion is circumferential.  3. Limited study to evaluate pericardial effusion FINDINGS  Left Ventricle: Left ventricular ejection fraction, by estimation, is 70 to 75%.  The left ventricle has hyperdynamic function. The left ventricle has no regional wall motion abnormalities.  Pericardium: A small pericardial effusion is present. The pericardial effusion is circumferential. Carlyle Dolly MD Electronically signed by Carlyle Dolly MD Signature Date/Time: 11/09/2021/6:25:11 PM    Final    ECHOCARDIOGRAM LIMITED  Result Date: 11/06/2021    ECHOCARDIOGRAM LIMITED REPORT   Patient Name:   SHAZIA MITCHENER Date of Exam: 11/06/2021 Medical Rec #:  [id number]      Height:       63.5 in Accession #:    [id number]     Weight:       143.5 lb Date of Birth:  October 21, 1951      BSA:          1.689 m Patient Age:    18 years       BP:           159/68 mmHg Patient Gender: F              HR:           88 bpm. Exam Location:  Inpatient Procedure: Limited Echo Indications:    pericardial tap  History:        Patient has prior history of Echocardiogram examinations, most                 recent 11/05/2021. Pericardial Disease, chronic kidney disease                 and COPD, Signs/Symptoms:Altered Mental Status; Risk                 Factors:Diabetes and Hypertension.  Sonographer:    Johny Chess RDCS Referring Phys: [place]  1. Large circumferential pericardial effusion up to 2.0 cm posterior to the LV. No RA/RV diastolic collapse. No dopper interrogation. Echo guided pericardiocentesis performed. Last image shows catheter posterior to LV, with improvement of effusion on final image to 0.5 cm. Large pericardial effusion. The pericardial effusion is circumferential.  2. Left ventricular ejection fraction, by estimation, is 60 to 65%. The left ventricle has normal function. The left ventricle has no regional wall motion abnormalities.  3. Right ventricular systolic function is normal. The right ventricular size is normal. FINDINGS  Left Ventricle: Left ventricular ejection fraction, by estimation, is 60 to 65%. The left ventricle has normal function. The left ventricle has no regional wall motion abnormalities. Right Ventricle: The right ventricular size is normal. No  increase in right ventricular wall thickness. Right ventricular systolic function is normal. Left Atrium: Left atrial size was normal in size. Right Atrium: Right atrial size was normal in size. Pericardium: Large circumferential pericardial effusion up to 2.0 cm posterior to the LV. No RA/RV diastolic collapse. No dopper interrogation. Echo guided pericardiocentesis performed. Last image shows catheter posterior to LV, with improvement of effusion on final image to 0.5 cm. A large pericardial effusion is present. The pericardial effusion is circumferential. Presence of epicardial fat layer. Eleonore Chiquito MD Electronically signed by Eleonore Chiquito MD Signature Date/Time: 11/06/2021/5:52:14 PM    Final    CT Maxillofacial Wo Contrast  Result Date: 12/02/2021 CLINICAL DATA:  Head trauma following un witnessed fall in a 71 year old female. EXAM: CT HEAD WITHOUT CONTRAST CT MAXILLOFACIAL WITHOUT CONTRAST CT CERVICAL SPINE WITHOUT CONTRAST TECHNIQUE: Multidetector CT imaging of the head, cervical spine, and maxillofacial structures were performed using the standard protocol without intravenous contrast. Multiplanar CT image reconstructions of the cervical spine and maxillofacial  structures were also generated. RADIATION DOSE REDUCTION: This exam was performed according to the departmental dose-optimization program which includes automated exposure control, adjustment of the mA and/or kV according to patient size and/or use of iterative reconstruction technique. COMPARISON:  November 28, 2021. FINDINGS: CT HEAD FINDINGS Brain: No evidence of acute infarction, hemorrhage, hydrocephalus, extra-axial collection or mass lesion/mass effect. Signs of atrophy and chronic microvascular ischemic change as before. Vascular: No hyperdense vessel or unexpected calcification. Skull: Normal. Negative for fracture or focal lesion. Other: Small scalp hematoma overlying the LEFT frontal region. CT MAXILLOFACIAL FINDINGS Osseous: No  fracture or mandibular dislocation. No destructive process. Orbits: Negative. No traumatic or inflammatory finding. Sinuses: Clear. Soft tissues: Mild stranding over the LEFT frontal region better visualized on the concurrent CT which is reported separately. CT CERVICAL SPINE FINDINGS Alignment: Straightening of normal cervical lordotic curvature with mild reversal of normal cervical lordotic curvature in the midcervical spine which is similar to prior imaging and seen in the setting of degenerative changes. Skull base and vertebrae: No acute fracture. No primary bone lesion or focal pathologic process. Soft tissues and spinal canal: No prevertebral fluid or swelling. No visible canal hematoma. Disc levels: Multilevel degenerative changes involving disc spaces, uncovertebral spurring and facet arthropathy as before. Cervical spinal canal narrowing due to degenerative changes in the mid and lower cervical spine as before. Upper chest: Negative. Other: None IMPRESSION: 1. No acute intracranial abnormality. 2. Small scalp hematoma overlying the LEFT frontal region without underlying skull fracture. 3. No evidence of acute facial bone fracture. 4. No evidence of acute fracture or subluxation of the cervical spine. 5. Marked multilevel degenerative changes involving the cervical spine as before. Electronically Signed   By: Zetta Bills M.D.   On: 12/02/2021 12:56    Assessment/Plan  1. Multiple falls -   imaging showed no acute abnormalities -   staff to monitor resident closely  2. PAF (paroxysmal atrial fibrillation) (HCC) -  continue Diltiazem 24 H ER 180 mg daily -  will schedule for a follow up consult with cardiology -  was not re-started on Eliquis and has been having multiple falls, will start on Lovenox 40 mg SQ daily and schedule for a cardiology appointment regarding anticoagulation  3. History of CVA (cerebrovascular accident) -   stable, continue ASA 81 mg daily and Rosuvastatin 20 mg Q  HS  4. Chronic diastolic heart failure (HCC) -   no SOB, continue Furosemide 20 mg every other day  5. Type 2 diabetes mellitus with stage 3a chronic kidney disease, with long-term current use of insulin (HCC) Lab Results  Component Value Date   HGBA1C 7.8 (H) 11/05/2021   -  continue Levemir 12 units BID and start Novolog 5 units SQ BID for CBG >= 200    Family/ staff Communication: Discussed plan of care with resident and charge nurse  Labs/tests ordered: tsh, BMP and CBC    Durenda Age, DNP, MSN, FNP-BC [facility] [phone] (Monday-Friday 8:00 a.m. - 5:00 p.m.) [phone] (after hours)

## 2021-12-11 ENCOUNTER — Non-Acute Institutional Stay (SKILLED_NURSING_FACILITY): Payer: Medicare Other | Admitting: Adult Health

## 2021-12-11 ENCOUNTER — Encounter: Payer: Self-pay | Admitting: Adult Health

## 2021-12-11 DIAGNOSIS — Z8673 Personal history of transient ischemic attack (TIA), and cerebral infarction without residual deficits: Secondary | ICD-10-CM | POA: Diagnosis not present

## 2021-12-11 DIAGNOSIS — E039 Hypothyroidism, unspecified: Secondary | ICD-10-CM | POA: Diagnosis not present

## 2021-12-11 DIAGNOSIS — I129 Hypertensive chronic kidney disease with stage 1 through stage 4 chronic kidney disease, or unspecified chronic kidney disease: Secondary | ICD-10-CM

## 2021-12-11 DIAGNOSIS — I480 Paroxysmal atrial fibrillation: Secondary | ICD-10-CM | POA: Diagnosis not present

## 2021-12-11 DIAGNOSIS — E1122 Type 2 diabetes mellitus with diabetic chronic kidney disease: Secondary | ICD-10-CM | POA: Diagnosis not present

## 2021-12-11 DIAGNOSIS — N182 Chronic kidney disease, stage 2 (mild): Secondary | ICD-10-CM

## 2021-12-11 NOTE — Progress Notes (Signed)
Location:  [facility] Room Number: 301A Place of Service:  SNF (31) Provider:  Durenda Age, DNP, FNP-BC  Patient Care Team: Hendricks Limes, MD as PCP - General (Internal Medicine) Carrolyn Meiers, MD (Internal Medicine) Satira Sark, MD (Cardiology) [name], MD (Gastroenterology) [name], NP as Nurse Practitioner (Internal Medicine)  Extended Emergency Contact Information Primary Emergency Contact: [facility] Address: [address]          [place], [place] [address] [name] of [place] Phone: [phone] Relation: Son Secondary Emergency Contact: [place] Phone: [phone] Relation: Other  Code Status:  FULL CODE  Goals of care: Advanced Directive information Advanced Directives 12/11/2021  Does Patient Have a Medical Advance Directive? Yes  Type of Advance Directive [place]  Does patient want to make changes to medical advance directive? No - Patient declined  Copy of [place] in Chart? Yes - validated most recent copy scanned in chart (See row information)  Would patient like information on creating a medical advance directive? -  Pre-existing out of facility DNR order (yellow form or pink MOST form) -  Some encounter information is confidential and restricted. Go to Review Flowsheets activity to see all data.     Chief Complaint  Patient presents with   Acute Visit    Short term care visit    HPI:  Pt is a 71 y.o. female seen today for short term rehabilitation visit. She is currently having PT, OT and ST.  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 (HCC)  CBGs ranging from 117-240, with outliers 89.  She takes NovoLog 5 units SQ 3 times daily before meals for CBG > 200 and Levemir 12 units SQ twice a day.  History of CVA (cerebrovascular accident)  -   has left hemiparesis, currently having PT, OT and ST.  She takes aspirin 81 mg daily  and rosuvastatin 20 mg daily  PAF (paroxysmal atrial fibrillation) (HCC)  -   she was recently started on Lovenox 40 mg daily for anticoagulation, used to be on Eliquis but had to falls.  She takes diltiazem 24-hour ER 180 mg 1 capsule daily  Primary hypothyroidism  -  takes levothyroxine 200 mcg 1 tab daily    Past Medical History:  Diagnosis Date   Arthritis    COPD (chronic obstructive pulmonary disease) (HCC)    Diverticulosis    Essential hypertension, benign    Hx of colonic polyps    Hypothyroidism    IBS (irritable bowel syndrome)    Ketoacidosis    Mixed hyperlipidemia    PTSD (post-traumatic stress disorder)    S/P colonoscopy 11/2009   RMR: pancolonic diverticula, polypoid rectal mucosa with  prominent lymphoid aggregates, repeat in Jan 2016    Stress incontinence, female    Tobacco use    Type 2 diabetes mellitus ([place])    Past Surgical History:  Procedure Laterality Date   Bilateral tubal ligation     BREAST BIOPSY Left 10/16/2014   negative   CHOLECYSTECTOMY  09/11/2012   Procedure: LAPAROSCOPIC CHOLECYSTECTOMY;  Surgeon: Jamesetta So, MD;  Location: [facility];  Service: General;  Laterality: N/A;   COLONOSCOPY  11/17/2009   Dr. Gala Romney: normal rectum, pancolonic diverticula, polyps benign, no adenomas.    COLONOSCOPY N/A 02/12/2015   Dr. Gala Romney: colonic diverticulosis, multiple colonic polyps (tubular adenomas), negative segmental biopsies. Surveillance in 2021   ESOPHAGOGASTRODUODENOSCOPY  11/23/2011   Dr.  Rourk: Erosive reflux esophagitis; small hiatal hernia; esophagus dilated empirically   MALONEY DILATION  11/23/2011   Procedure: MALONEY DILATION;  Surgeon: Daneil Dolin, MD;  Location: [facility] END[facility] SUITE;  Service: Endoscopy;  Laterality: N/A;   PERICARDIOCENTESIS N/A 11/06/2021   Procedure: PERICARDIOCENTESIS;  Surgeon: Jettie Booze, MD;  Location: [facility] CV LAB;  Service: Cardiovascular;  Laterality: N/A;   SKIN LESION EXCISION     on buttocks   TUBAL  LIGATION      No Known Allergies  Outpatient Encounter Medications as of 12/11/2021  Medication Sig   acetaminophen (TYLENOL) 325 MG tablet Take 650 mg by mouth every 6 (six) hours as needed.   albuterol (VENTOLIN HFA) 108 (90 Base) MCG/ACT inhaler Inhale 2 puffs into the lungs every 4 (four) hours as needed for wheezing or shortness of breath.   allopurinol (ZYLOPRIM) 100 MG tablet Take 50 mg by mouth daily. Idiopathic gout   aspirin 81 MG chewable tablet Chew 81 mg by mouth daily.   bisacodyl (DULCOLAX) 10 MG suppository Place 10 mg rectally daily as needed (constipation not relieved by MOM).   Calcium Carb-Cholecalciferol (OYSTER SHELL CALCIUM W/D) 500-200 MG-UNIT TABS Take 1 tablet by mouth 2 (two) times daily.   colchicine 0.6 MG tablet Take 1 tablet (0.6 mg total) by mouth daily.   colestipol (COLESTID) 1 g tablet TAKE 2 TABLETS DAILY FOR DIARRHEA. DO NOT TAKE WITHIN 2 HOURS OF OTHER ORAL MEDICATIONS. HOLD FOR CONSTIPATION.   diltiazem (CARDIZEM CD) 180 MG 24 hr capsule Take 180 mg by mouth daily.   divalproex (DEPAKOTE ER) 500 MG 24 hr tablet Take 2 tablets (1,000 mg total) by mouth every evening.   divalproex (DEPAKOTE) 250 MG DR tablet Take 1 tablet (250 mg total) by mouth daily.   enoxaparin (LOVENOX) 40 MG/0.4ML injection Inject 40 mg into the skin daily.   furosemide (LASIX) 20 MG tablet Take 1 tablet (20 mg total) by mouth every other day.   insulin aspart (NOVOLOG) 100 UNIT/ML injection Inject 5 Units into the skin 3 (three) times daily. For CBG >200   insulin detemir (LEVEMIR) 100 UNIT/ML injection Inject 0.12 mLs (12 Units total) into the skin 2 (two) times daily.   levothyroxine (SYNTHROID) 200 MCG tablet Take 200 mcg by mouth daily before breakfast.   LORazepam (ATIVAN) 0.5 MG tablet Take 0.5 mg by mouth 2 (two) times daily as needed for anxiety.   magnesium hydroxide (MILK OF MAGNESIA) 400 MG/5ML suspension Take 30 mLs by mouth daily as needed (if no BM in 3 days).    mirtazapine (REMERON) 15 MG tablet Take 15 mg by mouth at bedtime.   nicotine (NICODERM CQ - DOSED IN MG/24 HOURS) 14 mg/24hr patch Place 14 mg onto the skin daily. For 6 weeks   Nutritional Supplements (NUTRITIONAL SUPPLEMENT PO) Take 1 each by mouth daily. Magic Cup   ondansetron (ZOFRAN) 4 MG tablet Take 1 tablet (4 mg total) by mouth every 8 (eight) hours as needed for nausea or vomiting.   pantoprazole (PROTONIX) 40 MG tablet TAKE 1 TABLET BY MOUTH ONCE DAILY 30 MINTUES BEFORE BREAKFAST.   PARoxetine (PAXIL) 40 MG tablet Take 1 tablet (40 mg total) by mouth daily.   QUEtiapine (SEROQUEL) 100 MG tablet Take 1 tablet (100 mg total) by mouth at bedtime.   rosuvastatin (CRESTOR) 20 MG tablet Take 20 mg by mouth every evening.   senna-docusate (SENOKOT-S) 8.6-50 MG tablet Take 1 tablet by mouth at bedtime as needed for mild  constipation.   sertraline (ZOLOFT) 25 MG tablet Take 25 mg by mouth daily.   Sodium Phosphates (RA SALINE ENEMA RE) Place 1 Dose rectally once as needed (severe constipation). If no relief from both milk of magnesia, and Bisacodyl suppository, administer one dose of disposable saline enema.   vitamin B-12 (CYANOCOBALAMIN) 500 MCG tablet Take 500 mcg by mouth daily.   [DISCONTINUED] nicotine (NICODERM CQ - DOSED IN MG/24 HR) 7 mg/24hr patch Place 7 mg onto the skin daily. For 2 weeks   No facility-administered encounter medications on file as of 12/11/2021.    Review of Systems  Constitutional:  Negative for appetite change, chills, fatigue and fever.  HENT:  Negative for congestion, hearing loss, rhinorrhea and sore throat.   Eyes: Negative.   Respiratory:  Negative for cough, shortness of breath and wheezing.   Cardiovascular:  Negative for chest pain, palpitations and leg swelling.  Gastrointestinal:  Negative for abdominal pain, constipation, diarrhea, nausea and vomiting.  Genitourinary:  Negative for dysuria.  Skin:  Negative for color change, rash and wound.   Neurological:  Positive for weakness. Negative for dizziness and headaches.  Psychiatric/Behavioral:  The patient is not nervous/anxious.       Immunization History  Administered Date(s) Administered   Fluad Quad(high Dose 65+) 07/09/2021   Moderna Sars-Covid-2 Vaccination 11/16/2019, 12/10/2019   Tdap 07/09/2021   Zoster Recombinat (Shingrix) 07/09/2021   Pertinent  Health Maintenance Due  Topic Date Due   OPHTHALMOLOGY EXAM  Never done   HEMOGLOBIN A1C  05/06/2022   FOOT EXAM  08/04/2022   URINE MICROALBUMIN  11/20/2022   MAMMOGRAM  10/15/2023   COLONOSCOPY (Pts 45-35yrs Insurance coverage will need to be confirmed)  02/11/2025   INFLUENZA VACCINE  Completed   DEXA SCAN  Completed   Fall Risk 11/08/2021 11/09/2021 11/10/2021 11/28/2021 12/02/2021  Falls in the past year? - - - - -  Number of falls in past year - - - - -  Was there an injury with Fall? - - - - -  Fall Risk Category Calculator - - - - -  Fall Risk Category - - - - -  Patient Fall Risk Level High fall risk High fall risk High fall risk High fall risk High fall risk  Patient at Risk for Falls Due to - - - - -  Fall risk Follow up - - - - -     Vitals:   12/11/21 1126  BP: (!) 151/67  Pulse: 95  Resp: 19  Temp: (!) 96.6 F (35.9 C)  Weight: 135 lb 9.6 oz (61.5 kg)  Height: 5\' 4"  (1.626 m)   Body mass index is 23.28 kg/m.  Physical Exam Constitutional:      General: She is not in acute distress.    Appearance: Normal appearance. She is normal weight.  HENT:     Head: Normocephalic and atraumatic.     Nose: Nose normal.     Mouth/Throat:     Mouth: Mucous membranes are moist.  Eyes:     Conjunctiva/sclera: Conjunctivae normal.  Cardiovascular:     Rate and Rhythm: Normal rate and regular rhythm.  Pulmonary:     Effort: Pulmonary effort is normal.     Breath sounds: Normal breath sounds.  Abdominal:     General: Bowel sounds are normal.     Palpations: Abdomen is soft.  Musculoskeletal:      Cervical back: Normal range of motion.  Skin:    General: Skin is  warm and dry.  Neurological:     Mental Status: She is alert. Mental status is at baseline. She is disoriented.     Comments: Left hemiparesis  Psychiatric:        Mood and Affect: Mood normal.        Behavior: Behavior normal.      Labs reviewed: Recent Labs    12/12/20 0416 09/02/21 2018 11/05/21 0425 11/06/21 0319 12/02/21 1208  NA 140   < > 133* 136 134*  K 3.8   < > 4.6 4.1 4.4  CL 102   < > 96* 97* 101  CO2 29   < > 26 31 26   GLUCOSE 119*   < > 210* 172* 229*  BUN 34*   < > 54* 44* 18  CREATININE 0.75   < > 1.05* 0.99 0.83  CALCIUM 9.6   < > 9.1 10.3 9.5  MG 1.5*  --   --  1.7  --   PHOS 2.9  --   --  2.6  --    < > = values in this interval not displayed.   Recent Labs    11/04/21 1959 11/05/21 0425 12/02/21 1208  AST 42* 44* 16  ALT 25 27 12   ALKPHOS 70 61 59  BILITOT 0.7 1.1 0.5  PROT 6.2* 5.5* 5.8*  ALBUMIN 2.4* 2.2* 2.6*   Recent Labs    11/04/21 1959 11/04/21 2006 11/05/21 0425 11/06/21 0319 12/02/21 1208  WBC 7.5  --  7.6 7.1 7.4  NEUTROABS 5.0  --   --  3.6 5.3  HGB 12.6   < > 10.7* 11.3* 12.3  HCT 38.1   < > 32.3* 33.3* 37.4  MCV 95.3  --  96.1 92.8 99.5  PLT 290  --  272 264 169   < > = values in this interval not displayed.   Lab Results  Component Value Date   TSH 0.621 11/05/2021   Lab Results  Component Value Date   HGBA1C 7.8 (H) 11/05/2021   Lab Results  Component Value Date   CHOL 275 (H) 09/03/2021   HDL 39 (L) 09/03/2021   LDLCALC 202 (H) 09/03/2021   TRIG 169 (H) 09/03/2021   CHOLHDL 7.1 09/03/2021    Significant Diagnostic Results in last 30 days:  CT Head Wo Contrast  Result Date: 12/02/2021 CLINICAL DATA:  Head trauma following un witnessed fall in a 71 year old female. EXAM: CT HEAD WITHOUT CONTRAST CT MAXILLOFACIAL WITHOUT CONTRAST CT CERVICAL SPINE WITHOUT CONTRAST TECHNIQUE: Multidetector CT imaging of the head, cervical spine, and  maxillofacial structures were performed using the standard protocol without intravenous contrast. Multiplanar CT image reconstructions of the cervical spine and maxillofacial structures were also generated. RADIATION DOSE REDUCTION: This exam was performed according to the departmental dose-optimization program which includes automated exposure control, adjustment of the mA and/or kV according to patient size and/or use of iterative reconstruction technique. COMPARISON:  November 28, 2021. FINDINGS: CT HEAD FINDINGS Brain: No evidence of acute infarction, hemorrhage, hydrocephalus, extra-axial collection or mass lesion/mass effect. Signs of atrophy and chronic microvascular ischemic change as before. Vascular: No hyperdense vessel or unexpected calcification. Skull: Normal. Negative for fracture or focal lesion. Other: Small scalp hematoma overlying the LEFT frontal region. CT MAXILLOFACIAL FINDINGS Osseous: No fracture or mandibular dislocation. No destructive process. Orbits: Negative. No traumatic or inflammatory finding. Sinuses: Clear. Soft tissues: Mild stranding over the LEFT frontal region better visualized on the concurrent CT which is reported separately. CT CERVICAL  SPINE FINDINGS Alignment: Straightening of normal cervical lordotic curvature with mild reversal of normal cervical lordotic curvature in the midcervical spine which is similar to prior imaging and seen in the setting of degenerative changes. Skull base and vertebrae: No acute fracture. No primary bone lesion or focal pathologic process. Soft tissues and spinal canal: No prevertebral fluid or swelling. No visible canal hematoma. Disc levels: Multilevel degenerative changes involving disc spaces, uncovertebral spurring and facet arthropathy as before. Cervical spinal canal narrowing due to degenerative changes in the mid and lower cervical spine as before. Upper chest: Negative. Other: None IMPRESSION: 1. No acute intracranial abnormality. 2.  Small scalp hematoma overlying the LEFT frontal region without underlying skull fracture. 3. No evidence of acute facial bone fracture. 4. No evidence of acute fracture or subluxation of the cervical spine. 5. Marked multilevel degenerative changes involving the cervical spine as before. Electronically Signed   By: Zetta Bills M.D.   On: 12/02/2021 12:56   CT Head Wo Contrast  Result Date: 11/28/2021 CLINICAL DATA:  Trauma. EXAM: CT HEAD WITHOUT CONTRAST CT CERVICAL SPINE WITHOUT CONTRAST TECHNIQUE: Multidetector CT imaging of the head and cervical spine was performed following the standard protocol without intravenous contrast. Multiplanar CT image reconstructions of the cervical spine were also generated. RADIATION DOSE REDUCTION: This exam was performed according to the departmental dose-optimization program which includes automated exposure control, adjustment of the mA and/or kV according to patient size and/or use of iterative reconstruction technique. COMPARISON:  Head CT dated 11/04/2021. FINDINGS: CT HEAD FINDINGS Brain: Mild age-related atrophy and chronic microvascular ischemic changes. Small old right internal capsule lacunar infarct. There is no acute intracranial hemorrhage. No mass effect or midline shift. No extra-axial fluid collection. Vascular: No hyperdense vessel or unexpected calcification. Skull: Normal. Negative for fracture or focal lesion. Sinuses/Orbits: No acute finding. Other: None CT CERVICAL SPINE FINDINGS Alignment: No acute subluxation. There is mild reversal of normal cervical lordosis which may be positional or due to muscle spasm. Skull base and vertebrae: No acute fracture Soft tissues and spinal canal: No prevertebral fluid or swelling. No visible canal hematoma. Disc levels:  No acute findings.  Multilevel degenerative changes. Upper chest: Negative. Other: Bilateral carotid bulb calcified plaques. IMPRESSION: 1. No acute intracranial pathology. Mild age-related atrophy  and chronic microvascular ischemic changes. 2. No acute cervical spine fracture or subluxation. Electronically Signed   By: Anner Crete M.D.   On: 11/28/2021 21:45   CT Cervical Spine Wo Contrast  Result Date: 12/02/2021 CLINICAL DATA:  Head trauma following un witnessed fall in a 72 year old female. EXAM: CT HEAD WITHOUT CONTRAST CT MAXILLOFACIAL WITHOUT CONTRAST CT CERVICAL SPINE WITHOUT CONTRAST TECHNIQUE: Multidetector CT imaging of the head, cervical spine, and maxillofacial structures were performed using the standard protocol without intravenous contrast. Multiplanar CT image reconstructions of the cervical spine and maxillofacial structures were also generated. RADIATION DOSE REDUCTION: This exam was performed according to the departmental dose-optimization program which includes automated exposure control, adjustment of the mA and/or kV according to patient size and/or use of iterative reconstruction technique. COMPARISON:  November 28, 2021. FINDINGS: CT HEAD FINDINGS Brain: No evidence of acute infarction, hemorrhage, hydrocephalus, extra-axial collection or mass lesion/mass effect. Signs of atrophy and chronic microvascular ischemic change as before. Vascular: No hyperdense vessel or unexpected calcification. Skull: Normal. Negative for fracture or focal lesion. Other: Small scalp hematoma overlying the LEFT frontal region. CT MAXILLOFACIAL FINDINGS Osseous: No fracture or mandibular dislocation. No destructive process. Orbits: Negative. No traumatic or  inflammatory finding. Sinuses: Clear. Soft tissues: Mild stranding over the LEFT frontal region better visualized on the concurrent CT which is reported separately. CT CERVICAL SPINE FINDINGS Alignment: Straightening of normal cervical lordotic curvature with mild reversal of normal cervical lordotic curvature in the midcervical spine which is similar to prior imaging and seen in the setting of degenerative changes. Skull base and vertebrae: No  acute fracture. No primary bone lesion or focal pathologic process. Soft tissues and spinal canal: No prevertebral fluid or swelling. No visible canal hematoma. Disc levels: Multilevel degenerative changes involving disc spaces, uncovertebral spurring and facet arthropathy as before. Cervical spinal canal narrowing due to degenerative changes in the mid and lower cervical spine as before. Upper chest: Negative. Other: None IMPRESSION: 1. No acute intracranial abnormality. 2. Small scalp hematoma overlying the LEFT frontal region without underlying skull fracture. 3. No evidence of acute facial bone fracture. 4. No evidence of acute fracture or subluxation of the cervical spine. 5. Marked multilevel degenerative changes involving the cervical spine as before. Electronically Signed   By: Zetta Bills M.D.   On: 12/02/2021 12:56   CT Cervical Spine Wo Contrast  Result Date: 11/28/2021 CLINICAL DATA:  Trauma. EXAM: CT HEAD WITHOUT CONTRAST CT CERVICAL SPINE WITHOUT CONTRAST TECHNIQUE: Multidetector CT imaging of the head and cervical spine was performed following the standard protocol without intravenous contrast. Multiplanar CT image reconstructions of the cervical spine were also generated. RADIATION DOSE REDUCTION: This exam was performed according to the departmental dose-optimization program which includes automated exposure control, adjustment of the mA and/or kV according to patient size and/or use of iterative reconstruction technique. COMPARISON:  Head CT dated 11/04/2021. FINDINGS: CT HEAD FINDINGS Brain: Mild age-related atrophy and chronic microvascular ischemic changes. Small old right internal capsule lacunar infarct. There is no acute intracranial hemorrhage. No mass effect or midline shift. No extra-axial fluid collection. Vascular: No hyperdense vessel or unexpected calcification. Skull: Normal. Negative for fracture or focal lesion. Sinuses/Orbits: No acute finding. Other: None CT CERVICAL SPINE  FINDINGS Alignment: No acute subluxation. There is mild reversal of normal cervical lordosis which may be positional or due to muscle spasm. Skull base and vertebrae: No acute fracture Soft tissues and spinal canal: No prevertebral fluid or swelling. No visible canal hematoma. Disc levels:  No acute findings.  Multilevel degenerative changes. Upper chest: Negative. Other: Bilateral carotid bulb calcified plaques. IMPRESSION: 1. No acute intracranial pathology. Mild age-related atrophy and chronic microvascular ischemic changes. 2. No acute cervical spine fracture or subluxation. Electronically Signed   By: Anner Crete M.D.   On: 11/28/2021 21:45   DG Chest Portable 1 View  Result Date: 12/02/2021 CLINICAL DATA:  Left breast abrasion following a fall today. EXAM: PORTABLE CHEST 1 VIEW COMPARISON:  11/04/2021 FINDINGS: Mildly enlarged cardiac silhouette with an interval significant decrease in size. Mildly tortuous and partially calcified thoracic aorta. Clear lungs. No fracture or pneumothorax seen. Mild lower thoracic spine degenerative changes. IMPRESSION: No acute abnormality. Electronically Signed   By: Claudie Revering M.D.   On: 12/02/2021 12:12   DG Humerus Left  Result Date: 12/02/2021 CLINICAL DATA:  Fall EXAM: LEFT HUMERUS - 2+ VIEW COMPARISON:  04/17/2004 FINDINGS: There is no evidence of fracture or other focal bone lesions. Soft tissues are unremarkable. IMPRESSION: Negative. Electronically Signed   By: Franchot Gallo M.D.   On: 12/02/2021 12:43   ECHOCARDIOGRAM LIMITED  Result Date: 11/19/2021    ECHOCARDIOGRAM LIMITED REPORT   Patient Name:   BLESS LISENBY  Dildine Date of Exam: 11/19/2021 Medical Rec #:  [id number]      Height:       63.0 in Accession #:    [id number]     Weight:       139.8 lb Date of Birth:  August 03, 1951      BSA:          1.661 m Patient Age:    46 years       BP:           160/80 mmHg Patient Gender: F              HR:           95 bpm. Exam Location:  [address] Procedure:  Limited Echo, Cardiac Doppler and Limited Color Doppler Indications:    I31.39 Pericardial effusion. LIMITED follow up pericardial                 effusion.  History:        Patient has prior history of Echocardiogram examinations, most                 recent 11/09/2021. Pericardial effusion. Pericarditis, Stroke and                 COPD, Arrythmias:Atrial Fibrillation; Risk Factors:Diabetes,                 Hypertension, Dyslipidemia, Current Smoker and Obesity.                 COVID-19. CKD stage 3.  Sonographer:    Basilia Jumbo BS, RDCS Referring Phys: [place]  1. Left ventricular ejection fraction, by estimation, is 65 to 70%. The left ventricle has normal function. Comparison(s): 11/09/21 EF 70-75%. Small pericardial effusion. FINDINGS  Left Ventricle: Left ventricular ejection fraction, by estimation, is 65 to 70%. The left ventricle has normal function. The left ventricular internal cavity size was normal in size. Pericardium: Trivial pericardial effusion is present. Aorta: The aortic root and ascending aorta are structurally normal, with no evidence of dilitation. LEFT VENTRICLE PLAX 2D LVIDd:         3.60 cm Diastology LVIDs:         2.10 cm LV e' medial:    8.27 cm/s LV PW:         0.80 cm LV E/e' medial:  9.7 LV IVS:        0.60 cm LV e' lateral:   8.27 cm/s                        LV E/e' lateral: 9.7  IVC IVC diam: 1.10 cm LEFT ATRIUM         Index       RIGHT ATRIUM LA diam:    2.80 cm 1.69 cm/m  RA Pressure: 3.00 mmHg   AORTA Ao Root diam: 2.90 cm MITRAL VALVE                TRICUSPID VALVE                             Estimated RAP:  3.00 mmHg MV Decel Time: 165 msec MV E velocity: 80.20 cm/s MV A velocity: 112.00 cm/s MV E/A ratio:  0.72 Mertie Moores MD Electronically signed by Mertie Moores MD Signature Date/Time: 11/19/2021/4:58:53 PM    Final    CT Maxillofacial Wo Contrast  Result  Date: 12/02/2021 CLINICAL DATA:  Head trauma following un witnessed fall in a 71 year old  female. EXAM: CT HEAD WITHOUT CONTRAST CT MAXILLOFACIAL WITHOUT CONTRAST CT CERVICAL SPINE WITHOUT CONTRAST TECHNIQUE: Multidetector CT imaging of the head, cervical spine, and maxillofacial structures were performed using the standard protocol without intravenous contrast. Multiplanar CT image reconstructions of the cervical spine and maxillofacial structures were also generated. RADIATION DOSE REDUCTION: This exam was performed according to the departmental dose-optimization program which includes automated exposure control, adjustment of the mA and/or kV according to patient size and/or use of iterative reconstruction technique. COMPARISON:  November 28, 2021. FINDINGS: CT HEAD FINDINGS Brain: No evidence of acute infarction, hemorrhage, hydrocephalus, extra-axial collection or mass lesion/mass effect. Signs of atrophy and chronic microvascular ischemic change as before. Vascular: No hyperdense vessel or unexpected calcification. Skull: Normal. Negative for fracture or focal lesion. Other: Small scalp hematoma overlying the LEFT frontal region. CT MAXILLOFACIAL FINDINGS Osseous: No fracture or mandibular dislocation. No destructive process. Orbits: Negative. No traumatic or inflammatory finding. Sinuses: Clear. Soft tissues: Mild stranding over the LEFT frontal region better visualized on the concurrent CT which is reported separately. CT CERVICAL SPINE FINDINGS Alignment: Straightening of normal cervical lordotic curvature with mild reversal of normal cervical lordotic curvature in the midcervical spine which is similar to prior imaging and seen in the setting of degenerative changes. Skull base and vertebrae: No acute fracture. No primary bone lesion or focal pathologic process. Soft tissues and spinal canal: No prevertebral fluid or swelling. No visible canal hematoma. Disc levels: Multilevel degenerative changes involving disc spaces, uncovertebral spurring and facet arthropathy as before. Cervical spinal  canal narrowing due to degenerative changes in the mid and lower cervical spine as before. Upper chest: Negative. Other: None IMPRESSION: 1. No acute intracranial abnormality. 2. Small scalp hematoma overlying the LEFT frontal region without underlying skull fracture. 3. No evidence of acute facial bone fracture. 4. No evidence of acute fracture or subluxation of the cervical spine. 5. Marked multilevel degenerative changes involving the cervical spine as before. Electronically Signed   By: Zetta Bills M.D.   On: 12/02/2021 12:56    Assessment/Plan  1. 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 (HCC) Lab Results  Component Value Date   HGBA1C 7.8 (H) 11/05/2021   -   CBGs stable, Levemir and NovoLog  2. History of CVA (cerebrovascular accident) -  stable, continue PT, OT and ST -  continue ASA and Rosuvastatin  3. PAF (paroxysmal atrial fibrillation) (HCC) -Rate controlled, continue Lovenox for anticoagulation and diltiazem for rate control  4. Primary hypothyroidism Lab Results  Component Value Date   TSH 0.621 11/05/2021   -   Continue levothyroxine 200 mcg daily    Family/ staff Communication: Discussed plan of care with resident and charge nurse  Labs/tests ordered:   tsh, CBC    Durenda Age, DNP, MSN, FNP-BC [facility] [phone] (Monday-Friday 8:00 a.m. - 5:00 p.m.) [phone] (after hours)

## 2021-12-14 LAB — BASIC METABOLIC PANEL
BUN: 16 (ref 4–21)
CO2: 29 — AB (ref 13–22)
Chloride: 105 (ref 99–108)
Creatinine: 0.6 (ref 0.5–1.1)
Glucose: 84
Potassium: 4.7 (ref 3.4–5.3)
Sodium: 143 (ref 137–147)

## 2021-12-14 LAB — TSH: TSH: 0.14 — AB (ref 0.41–5.90)

## 2021-12-14 LAB — CBC AND DIFFERENTIAL
HCT: 34 — AB (ref 36–46)
Hemoglobin: 10.6 — AB (ref 12.0–16.0)
Platelets: 360 (ref 150–399)
WBC: 4.6

## 2021-12-14 LAB — COMPREHENSIVE METABOLIC PANEL: Calcium: 9.6 (ref 8.7–10.7)

## 2021-12-14 LAB — CBC: RBC: 3.52 — AB (ref 3.87–5.11)

## 2021-12-15 ENCOUNTER — Non-Acute Institutional Stay (SKILLED_NURSING_FACILITY): Payer: Medicare Other | Admitting: Adult Health

## 2021-12-15 DIAGNOSIS — E039 Hypothyroidism, unspecified: Secondary | ICD-10-CM

## 2021-12-15 DIAGNOSIS — I480 Paroxysmal atrial fibrillation: Secondary | ICD-10-CM | POA: Diagnosis not present

## 2021-12-15 NOTE — Progress Notes (Signed)
Location:  [facility] Room Number: [place] of Service:  SNF (31) Provider:  Durenda Age, DNP, FNP-BC  Patient Care Team: Hendricks Limes, MD as PCP - General (Internal Medicine) Carrolyn Meiers, MD (Internal Medicine) Satira Sark, MD (Cardiology) [name], MD (Gastroenterology) [name], NP as Nurse Practitioner (Internal Medicine)  Extended Emergency Contact Information Primary Emergency Contact: [facility] Address: [address]          [name], [place] [address] [name] of [place] Phone: [phone] Relation: Son Secondary Emergency Contact: [name] Phone: [phone] Relation: Other  Code Status:  Full Code  Goals of care: Advanced Directive information Advanced Directives 12/11/2021  Does Patient Have a Medical Advance Directive? Yes  Type of Advance Directive [place]  Does patient want to make changes to medical advance directive? No - Patient declined  Copy of [place] in Chart? Yes - validated most recent copy scanned in chart (See row information)  Would patient like information on creating a medical advance directive? -  Pre-existing out of facility DNR order (yellow form or pink MOST form) -  Some encounter information is confidential and restricted. Go to Review Flowsheets activity to see all data.     Chief Complaint  Patient presents with   Acute Visit    Low tsh level    HPI:  Claudia Keller is a 71 y.o. female seen today for an acute visit. She was noted to have tsh level 0.14, low. She takes Levothyroxine 200 mcg daily for hypothyroidism. HRs ranging from 82 to 95. She takes Diltiazem 24 ER 180 mg 1 capsule daily for PAF. She is a long-term care resident of [facility].   Past Medical History:  Diagnosis Date   Arthritis    COPD (chronic obstructive pulmonary disease) ([facility])    Diverticulosis    Essential  hypertension, benign    Hx of colonic polyps    Hypothyroidism    IBS (irritable bowel syndrome)    Ketoacidosis    Mixed hyperlipidemia    PTSD (post-traumatic stress disorder)    S/P colonoscopy 11/2009   RMR: pancolonic diverticula, polypoid rectal mucosa with  prominent lymphoid aggregates, repeat in Jan 2016    Stress incontinence, female    Tobacco use    Type 2 diabetes mellitus ([place])    Past Surgical History:  Procedure Laterality Date   Bilateral tubal ligation     BREAST BIOPSY Left 10/16/2014   negative   CHOLECYSTECTOMY  09/11/2012   Procedure: LAPAROSCOPIC CHOLECYSTECTOMY;  Surgeon: Jamesetta So, MD;  Location: [facility] ORS;  Service: General;  Laterality: N/A;   COLONOSCOPY  11/17/2009   Dr. Gala Romney: normal rectum, pancolonic diverticula, polyps benign, no adenomas.    COLONOSCOPY N/A 02/12/2015   Dr. Gala Romney: colonic diverticulosis, multiple colonic polyps (tubular adenomas), negative segmental biopsies. Surveillance in 2021   ESOPHAGOGASTRODUODENOSCOPY  11/23/2011   Dr. Gala Romney: Erosive reflux esophagitis; small hiatal hernia; esophagus dilated empirically   MALONEY DILATION  11/23/2011   Procedure: Venia Minks DILATION;  Surgeon: Daneil Dolin, MD;  Location: [facility] ENDO SUITE;  Service: Endoscopy;  Laterality: N/A;   PERICARDIOCENTESIS N/A 11/06/2021   Procedure: PERICARDIOCENTESIS;  Surgeon: Jettie Booze, MD;  Location: [facility] CV LAB;  Service: Cardiovascular;  Laterality: N/A;   SKIN LESION EXCISION     on buttocks   TUBAL LIGATION      No Known Allergies  Outpatient Encounter Medications  as of 12/15/2021  Medication Sig   acetaminophen (TYLENOL) 325 MG tablet Take 650 mg by mouth every 6 (six) hours as needed.   albuterol (VENTOLIN HFA) 108 (90 Base) MCG/ACT inhaler Inhale 2 puffs into the lungs every 4 (four) hours as needed for wheezing or shortness of breath.   allopurinol (ZYLOPRIM) 100 MG tablet Take 50 mg by mouth daily. Idiopathic gout   aspirin 81 MG chewable  tablet Chew 81 mg by mouth daily.   bisacodyl (DULCOLAX) 10 MG suppository Place 10 mg rectally daily as needed (constipation not relieved by MOM).   Calcium Carb-Cholecalciferol (OYSTER SHELL CALCIUM W/D) 500-200 MG-UNIT TABS Take 1 tablet by mouth 2 (two) times daily.   colchicine 0.6 MG tablet Take 1 tablet (0.6 mg total) by mouth daily.   colestipol (COLESTID) 1 g tablet TAKE 2 TABLETS DAILY FOR DIARRHEA. DO NOT TAKE WITHIN 2 HOURS OF OTHER ORAL MEDICATIONS. HOLD FOR CONSTIPATION.   diltiazem (CARDIZEM CD) 180 MG 24 hr capsule Take 180 mg by mouth daily.   divalproex (DEPAKOTE ER) 500 MG 24 hr tablet Take 2 tablets (1,000 mg total) by mouth every evening.   divalproex (DEPAKOTE) 250 MG DR tablet Take 1 tablet (250 mg total) by mouth daily.   enoxaparin (LOVENOX) 40 MG/0.4ML injection Inject 40 mg into the skin daily.   furosemide (LASIX) 20 MG tablet Take 1 tablet (20 mg total) by mouth every other day.   insulin aspart (NOVOLOG) 100 UNIT/ML injection Inject 5 Units into the skin 3 (three) times daily. For CBG >200   insulin detemir (LEVEMIR) 100 UNIT/ML injection Inject 0.12 mLs (12 Units total) into the skin 2 (two) times daily.   levothyroxine (SYNTHROID) 200 MCG tablet Take 200 mcg by mouth daily before breakfast.   LORazepam (ATIVAN) 0.5 MG tablet Take 0.5 mg by mouth 2 (two) times daily as needed for anxiety.   magnesium hydroxide (MILK OF MAGNESIA) 400 MG/5ML suspension Take 30 mLs by mouth daily as needed (if no BM in 3 days).   mirtazapine (REMERON) 15 MG tablet Take 15 mg by mouth at bedtime.   nicotine (NICODERM CQ - DOSED IN MG/24 HOURS) 14 mg/24hr patch Place 14 mg onto the skin daily. For 6 weeks   Nutritional Supplements (NUTRITIONAL SUPPLEMENT PO) Take 1 each by mouth daily. Magic Cup   ondansetron (ZOFRAN) 4 MG tablet Take 1 tablet (4 mg total) by mouth every 8 (eight) hours as needed for nausea or vomiting.   pantoprazole (PROTONIX) 40 MG tablet TAKE 1 TABLET BY MOUTH ONCE  DAILY 30 MINTUES BEFORE BREAKFAST.   PARoxetine (PAXIL) 40 MG tablet Take 1 tablet (40 mg total) by mouth daily.   QUEtiapine (SEROQUEL) 100 MG tablet Take 1 tablet (100 mg total) by mouth at bedtime.   rosuvastatin (CRESTOR) 20 MG tablet Take 20 mg by mouth every evening.   senna-docusate (SENOKOT-S) 8.6-50 MG tablet Take 1 tablet by mouth at bedtime as needed for mild constipation.   sertraline (ZOLOFT) 25 MG tablet Take 25 mg by mouth daily.   Sodium Phosphates (RA SALINE ENEMA RE) Place 1 Dose rectally once as needed (severe constipation). If no relief from both milk of magnesia, and Bisacodyl suppository, administer one dose of disposable saline enema.   vitamin B-12 (CYANOCOBALAMIN) 500 MCG tablet Take 500 mcg by mouth daily.   No facility-administered encounter medications on file as of 12/15/2021.    Review of Systems  Constitutional:  Negative for appetite change, chills, fatigue and fever.  HENT:  Negative for congestion, hearing loss, rhinorrhea and sore throat.   Eyes: Negative.   Respiratory:  Negative for cough, shortness of breath and wheezing.   Cardiovascular:  Negative for chest pain, palpitations and leg swelling.  Gastrointestinal:  Negative for abdominal pain, constipation, diarrhea, nausea and vomiting.  Genitourinary:  Negative for dysuria.  Skin:  Negative for color change, rash and wound.  Neurological:  Negative for dizziness, weakness and headaches.  Psychiatric/Behavioral:  Negative for behavioral problems. The patient is not nervous/anxious.       Immunization History  Administered Date(s) Administered   Fluad Quad(high Dose 65+) 07/09/2021   Moderna Sars-Covid-2 Vaccination 11/16/2019, 12/10/2019   Tdap 07/09/2021   Zoster Recombinat (Shingrix) 07/09/2021   Pertinent  Health Maintenance Due  Topic Date Due   OPHTHALMOLOGY EXAM  Never done   HEMOGLOBIN A1C  05/06/2022   FOOT EXAM  08/04/2022   URINE MICROALBUMIN  11/20/2022   MAMMOGRAM  10/15/2023    COLONOSCOPY (Pts 45-22yrs Insurance coverage will need to be confirmed)  02/11/2025   INFLUENZA VACCINE  Completed   DEXA SCAN  Completed   Fall Risk 11/08/2021 11/09/2021 11/10/2021 11/28/2021 12/02/2021  Falls in the past year? - - - - -  Number of falls in past year - - - - -  Was there an injury with Fall? - - - - -  Fall Risk Category Calculator - - - - -  Fall Risk Category - - - - -  Patient Fall Risk Level High fall risk High fall risk High fall risk High fall risk High fall risk  Patient at Risk for Falls Due to - - - - -  Fall risk Follow up - - - - -     Vitals:   12/15/21 1000  BP: 120/87  Pulse: 90  Resp: 18  Temp: (!) 96.9 F (36.1 C)  Weight: 135 lb 9.6 oz (61.5 kg)  Height: 5\' 4"  (1.626 m)   Body mass index is 23.28 kg/m.  Physical Exam Constitutional:      General: She is not in acute distress.    Appearance: Normal appearance.  HENT:     Head: Normocephalic and atraumatic.     Nose: Nose normal.     Mouth/Throat:     Mouth: Mucous membranes are moist.  Eyes:     Conjunctiva/sclera: Conjunctivae normal.  Cardiovascular:     Rate and Rhythm: Normal rate and regular rhythm.  Pulmonary:     Effort: Pulmonary effort is normal.     Breath sounds: Normal breath sounds.  Abdominal:     General: Bowel sounds are normal.     Palpations: Abdomen is soft.  Musculoskeletal:        General: Normal range of motion.     Cervical back: Normal range of motion.  Skin:    General: Skin is warm and dry.  Neurological:     Mental Status: She is alert. Mental status is at baseline. She is disoriented.  Psychiatric:        Mood and Affect: Mood normal.        Behavior: Behavior normal.       Labs reviewed: Recent Labs    11/05/21 0425 11/06/21 0319 12/02/21 1208  NA 133* 136 134*  K 4.6 4.1 4.4  CL 96* 97* 101  CO2 26 31 26   GLUCOSE 210* 172* 229*  BUN 54* 44* 18  CREATININE 1.05* 0.99 0.83  CALCIUM 9.1 10.3 9.5  MG  --  1.7  --   PHOS  --  2.6  --     Recent Labs    11/04/21 1959 11/05/21 0425 12/02/21 1208  AST 42* 44* 16  ALT 25 27 12   ALKPHOS 70 61 59  BILITOT 0.7 1.1 0.5  PROT 6.2* 5.5* 5.8*  ALBUMIN 2.4* 2.2* 2.6*   Recent Labs    11/04/21 1959 11/04/21 2006 11/05/21 0425 11/06/21 0319 12/02/21 1208  WBC 7.5  --  7.6 7.1 7.4  NEUTROABS 5.0  --   --  3.6 5.3  HGB 12.6   < > 10.7* 11.3* 12.3  HCT 38.1   < > 32.3* 33.3* 37.4  MCV 95.3  --  96.1 92.8 99.5  PLT 290  --  272 264 169   < > = values in this interval not displayed.   Lab Results  Component Value Date   TSH 0.621 11/05/2021   Lab Results  Component Value Date   HGBA1C 7.8 (H) 11/05/2021   Lab Results  Component Value Date   CHOL 275 (H) 09/03/2021   HDL 39 (L) 09/03/2021   LDLCALC 202 (H) 09/03/2021   TRIG 169 (H) 09/03/2021   CHOLHDL 7.1 09/03/2021    Significant Diagnostic Results in last 30 days:  CT Head Wo Contrast  Result Date: 12/02/2021 CLINICAL DATA:  Head trauma following un witnessed fall in a 71 year old female. EXAM: CT HEAD WITHOUT CONTRAST CT MAXILLOFACIAL WITHOUT CONTRAST CT CERVICAL SPINE WITHOUT CONTRAST TECHNIQUE: Multidetector CT imaging of the head, cervical spine, and maxillofacial structures were performed using the standard protocol without intravenous contrast. Multiplanar CT image reconstructions of the cervical spine and maxillofacial structures were also generated. RADIATION DOSE REDUCTION: This exam was performed according to the departmental dose-optimization program which includes automated exposure control, adjustment of the mA and/or kV according to patient size and/or use of iterative reconstruction technique. COMPARISON:  November 28, 2021. FINDINGS: CT HEAD FINDINGS Brain: No evidence of acute infarction, hemorrhage, hydrocephalus, extra-axial collection or mass lesion/mass effect. Signs of atrophy and chronic microvascular ischemic change as before. Vascular: No hyperdense vessel or unexpected calcification. Skull:  Normal. Negative for fracture or focal lesion. Other: Small scalp hematoma overlying the LEFT frontal region. CT MAXILLOFACIAL FINDINGS Osseous: No fracture or mandibular dislocation. No destructive process. Orbits: Negative. No traumatic or inflammatory finding. Sinuses: Clear. Soft tissues: Mild stranding over the LEFT frontal region better visualized on the concurrent CT which is reported separately. CT CERVICAL SPINE FINDINGS Alignment: Straightening of normal cervical lordotic curvature with mild reversal of normal cervical lordotic curvature in the midcervical spine which is similar to prior imaging and seen in the setting of degenerative changes. Skull base and vertebrae: No acute fracture. No primary bone lesion or focal pathologic process. Soft tissues and spinal canal: No prevertebral fluid or swelling. No visible canal hematoma. Disc levels: Multilevel degenerative changes involving disc spaces, uncovertebral spurring and facet arthropathy as before. Cervical spinal canal narrowing due to degenerative changes in the mid and lower cervical spine as before. Upper chest: Negative. Other: None IMPRESSION: 1. No acute intracranial abnormality. 2. Small scalp hematoma overlying the LEFT frontal region without underlying skull fracture. 3. No evidence of acute facial bone fracture. 4. No evidence of acute fracture or subluxation of the cervical spine. 5. Marked multilevel degenerative changes involving the cervical spine as before. Electronically Signed   By: Zetta Bills M.D.   On: 12/02/2021 12:56   CT Head Wo Contrast  Result Date: 11/28/2021 CLINICAL DATA:  Trauma. EXAM: CT HEAD WITHOUT CONTRAST CT CERVICAL SPINE WITHOUT CONTRAST TECHNIQUE: Multidetector CT imaging of the head and cervical spine was performed following the standard protocol without intravenous contrast. Multiplanar CT image reconstructions of the cervical spine were also generated. RADIATION DOSE REDUCTION: This exam was performed  according to the departmental dose-optimization program which includes automated exposure control, adjustment of the mA and/or kV according to patient size and/or use of iterative reconstruction technique. COMPARISON:  Head CT dated 11/04/2021. FINDINGS: CT HEAD FINDINGS Brain: Mild age-related atrophy and chronic microvascular ischemic changes. Small old right internal capsule lacunar infarct. There is no acute intracranial hemorrhage. No mass effect or midline shift. No extra-axial fluid collection. Vascular: No hyperdense vessel or unexpected calcification. Skull: Normal. Negative for fracture or focal lesion. Sinuses/Orbits: No acute finding. Other: None CT CERVICAL SPINE FINDINGS Alignment: No acute subluxation. There is mild reversal of normal cervical lordosis which may be positional or due to muscle spasm. Skull base and vertebrae: No acute fracture Soft tissues and spinal canal: No prevertebral fluid or swelling. No visible canal hematoma. Disc levels:  No acute findings.  Multilevel degenerative changes. Upper chest: Negative. Other: Bilateral carotid bulb calcified plaques. IMPRESSION: 1. No acute intracranial pathology. Mild age-related atrophy and chronic microvascular ischemic changes. 2. No acute cervical spine fracture or subluxation. Electronically Signed   By: Anner Crete M.D.   On: 11/28/2021 21:45   CT Cervical Spine Wo Contrast  Result Date: 12/02/2021 CLINICAL DATA:  Head trauma following un witnessed fall in a 71 year old female. EXAM: CT HEAD WITHOUT CONTRAST CT MAXILLOFACIAL WITHOUT CONTRAST CT CERVICAL SPINE WITHOUT CONTRAST TECHNIQUE: Multidetector CT imaging of the head, cervical spine, and maxillofacial structures were performed using the standard protocol without intravenous contrast. Multiplanar CT image reconstructions of the cervical spine and maxillofacial structures were also generated. RADIATION DOSE REDUCTION: This exam was performed according to the departmental  dose-optimization program which includes automated exposure control, adjustment of the mA and/or kV according to patient size and/or use of iterative reconstruction technique. COMPARISON:  November 28, 2021. FINDINGS: CT HEAD FINDINGS Brain: No evidence of acute infarction, hemorrhage, hydrocephalus, extra-axial collection or mass lesion/mass effect. Signs of atrophy and chronic microvascular ischemic change as before. Vascular: No hyperdense vessel or unexpected calcification. Skull: Normal. Negative for fracture or focal lesion. Other: Small scalp hematoma overlying the LEFT frontal region. CT MAXILLOFACIAL FINDINGS Osseous: No fracture or mandibular dislocation. No destructive process. Orbits: Negative. No traumatic or inflammatory finding. Sinuses: Clear. Soft tissues: Mild stranding over the LEFT frontal region better visualized on the concurrent CT which is reported separately. CT CERVICAL SPINE FINDINGS Alignment: Straightening of normal cervical lordotic curvature with mild reversal of normal cervical lordotic curvature in the midcervical spine which is similar to prior imaging and seen in the setting of degenerative changes. Skull base and vertebrae: No acute fracture. No primary bone lesion or focal pathologic process. Soft tissues and spinal canal: No prevertebral fluid or swelling. No visible canal hematoma. Disc levels: Multilevel degenerative changes involving disc spaces, uncovertebral spurring and facet arthropathy as before. Cervical spinal canal narrowing due to degenerative changes in the mid and lower cervical spine as before. Upper chest: Negative. Other: None IMPRESSION: 1. No acute intracranial abnormality. 2. Small scalp hematoma overlying the LEFT frontal region without underlying skull fracture. 3. No evidence of acute facial bone fracture. 4. No evidence of acute fracture or subluxation of the cervical spine. 5. Marked multilevel degenerative changes involving the cervical spine as before.  Electronically  Signed   By: Zetta Bills M.D.   On: 12/02/2021 12:56   CT Cervical Spine Wo Contrast  Result Date: 11/28/2021 CLINICAL DATA:  Trauma. EXAM: CT HEAD WITHOUT CONTRAST CT CERVICAL SPINE WITHOUT CONTRAST TECHNIQUE: Multidetector CT imaging of the head and cervical spine was performed following the standard protocol without intravenous contrast. Multiplanar CT image reconstructions of the cervical spine were also generated. RADIATION DOSE REDUCTION: This exam was performed according to the departmental dose-optimization program which includes automated exposure control, adjustment of the mA and/or kV according to patient size and/or use of iterative reconstruction technique. COMPARISON:  Head CT dated 11/04/2021. FINDINGS: CT HEAD FINDINGS Brain: Mild age-related atrophy and chronic microvascular ischemic changes. Small old right internal capsule lacunar infarct. There is no acute intracranial hemorrhage. No mass effect or midline shift. No extra-axial fluid collection. Vascular: No hyperdense vessel or unexpected calcification. Skull: Normal. Negative for fracture or focal lesion. Sinuses/Orbits: No acute finding. Other: None CT CERVICAL SPINE FINDINGS Alignment: No acute subluxation. There is mild reversal of normal cervical lordosis which may be positional or due to muscle spasm. Skull base and vertebrae: No acute fracture Soft tissues and spinal canal: No prevertebral fluid or swelling. No visible canal hematoma. Disc levels:  No acute findings.  Multilevel degenerative changes. Upper chest: Negative. Other: Bilateral carotid bulb calcified plaques. IMPRESSION: 1. No acute intracranial pathology. Mild age-related atrophy and chronic microvascular ischemic changes. 2. No acute cervical spine fracture or subluxation. Electronically Signed   By: Anner Crete M.D.   On: 11/28/2021 21:45   DG Chest Portable 1 View  Result Date: 12/02/2021 CLINICAL DATA:  Left breast abrasion following a fall  today. EXAM: PORTABLE CHEST 1 VIEW COMPARISON:  11/04/2021 FINDINGS: Mildly enlarged cardiac silhouette with an interval significant decrease in size. Mildly tortuous and partially calcified thoracic aorta. Clear lungs. No fracture or pneumothorax seen. Mild lower thoracic spine degenerative changes. IMPRESSION: No acute abnormality. Electronically Signed   By: Claudie Revering M.D.   On: 12/02/2021 12:12   DG Humerus Left  Result Date: 12/02/2021 CLINICAL DATA:  Fall EXAM: LEFT HUMERUS - 2+ VIEW COMPARISON:  04/17/2004 FINDINGS: There is no evidence of fracture or other focal bone lesions. Soft tissues are unremarkable. IMPRESSION: Negative. Electronically Signed   By: Franchot Gallo M.D.   On: 12/02/2021 12:43   CT Maxillofacial Wo Contrast  Result Date: 12/02/2021 CLINICAL DATA:  Head trauma following un witnessed fall in a 71 year old female. EXAM: CT HEAD WITHOUT CONTRAST CT MAXILLOFACIAL WITHOUT CONTRAST CT CERVICAL SPINE WITHOUT CONTRAST TECHNIQUE: Multidetector CT imaging of the head, cervical spine, and maxillofacial structures were performed using the standard protocol without intravenous contrast. Multiplanar CT image reconstructions of the cervical spine and maxillofacial structures were also generated. RADIATION DOSE REDUCTION: This exam was performed according to the departmental dose-optimization program which includes automated exposure control, adjustment of the mA and/or kV according to patient size and/or use of iterative reconstruction technique. COMPARISON:  November 28, 2021. FINDINGS: CT HEAD FINDINGS Brain: No evidence of acute infarction, hemorrhage, hydrocephalus, extra-axial collection or mass lesion/mass effect. Signs of atrophy and chronic microvascular ischemic change as before. Vascular: No hyperdense vessel or unexpected calcification. Skull: Normal. Negative for fracture or focal lesion. Other: Small scalp hematoma overlying the LEFT frontal region. CT MAXILLOFACIAL FINDINGS  Osseous: No fracture or mandibular dislocation. No destructive process. Orbits: Negative. No traumatic or inflammatory finding. Sinuses: Clear. Soft tissues: Mild stranding over the LEFT frontal region better visualized on the concurrent CT  which is reported separately. CT CERVICAL SPINE FINDINGS Alignment: Straightening of normal cervical lordotic curvature with mild reversal of normal cervical lordotic curvature in the midcervical spine which is similar to prior imaging and seen in the setting of degenerative changes. Skull base and vertebrae: No acute fracture. No primary bone lesion or focal pathologic process. Soft tissues and spinal canal: No prevertebral fluid or swelling. No visible canal hematoma. Disc levels: Multilevel degenerative changes involving disc spaces, uncovertebral spurring and facet arthropathy as before. Cervical spinal canal narrowing due to degenerative changes in the mid and lower cervical spine as before. Upper chest: Negative. Other: None IMPRESSION: 1. No acute intracranial abnormality. 2. Small scalp hematoma overlying the LEFT frontal region without underlying skull fracture. 3. No evidence of acute facial bone fracture. 4. No evidence of acute fracture or subluxation of the cervical spine. 5. Marked multilevel degenerative changes involving the cervical spine as before. Electronically Signed   By: Zetta Bills M.D.   On: 12/02/2021 12:56    Assessment/Plan  1. Primary hypothyroidism -  tsh is low at 0.14, will decrease Levothyroxine from 200 mcg to 150 mcg daily  2. PAF (paroxysmal atrial fibrillation) ([place]) -  rate-controlled, continue Diltiazem ER    Family/ staff Communication: Discussed plan of care with resident and charge nurse.  Labs/tests ordered:   tsh in 6 weeks    Durenda Age, DNP, MSN, FNP-BC [place] [phone] (Monday-Friday 8:00 a.m. - 5:00 p.m.) [phone] (after hours)

## 2021-12-16 ENCOUNTER — Ambulatory Visit: Payer: Medicare (Managed Care) | Admitting: Neurology

## 2021-12-21 ENCOUNTER — Encounter: Payer: Self-pay | Admitting: Adult Health

## 2021-12-29 LAB — COMPREHENSIVE METABOLIC PANEL
Albumin: 2.8 — AB (ref 3.5–5.0)
Calcium: 9.5 (ref 8.7–10.7)

## 2021-12-29 LAB — HEPATIC FUNCTION PANEL
ALT: 11 (ref 7–35)
AST: 12 — AB (ref 13–35)
Alkaline Phosphatase: 120 (ref 25–125)
Bilirubin, Total: 0.2

## 2021-12-29 LAB — BASIC METABOLIC PANEL
BUN: 23 — AB (ref 4–21)
CO2: 33 — AB (ref 13–22)
Chloride: 97 — AB (ref 99–108)
Creatinine: 0.7 (ref 0.5–1.1)
Potassium: 4.1 (ref 3.4–5.3)
Sodium: 137 (ref 137–147)

## 2021-12-29 LAB — CBC AND DIFFERENTIAL
HCT: 33 — AB (ref 36–46)
Hemoglobin: 10.9 — AB (ref 12.0–16.0)
Platelets: 262 (ref 150–399)
WBC: 4.3

## 2021-12-29 LAB — CBC: RBC: 3.51 — AB (ref 3.87–5.11)

## 2021-12-31 ENCOUNTER — Non-Acute Institutional Stay (INDEPENDENT_AMBULATORY_CARE_PROVIDER_SITE_OTHER): Payer: Medicare Other | Admitting: Adult Health

## 2021-12-31 ENCOUNTER — Encounter: Payer: Self-pay | Admitting: Adult Health

## 2021-12-31 DIAGNOSIS — Z0000 Encounter for general adult medical examination without abnormal findings: Secondary | ICD-10-CM

## 2021-12-31 NOTE — Progress Notes (Signed)
Subjective:   Claudia Keller is a 71 y.o. female who presents for Medicare Annual (Subsequent) preventive examination.  Review of Systems     Cardiac Risk Factors include: advanced age (>57men, >72 women);hypertension;dyslipidemia;diabetes mellitus     Objective:    Today's Vitals   12/31/21 0939  BP: 115/68  Pulse: 86  Resp: 19  Temp: 97.9 F (36.6 C)  Weight: 135 lb 9.6 oz (61.5 kg)  Height: 5\' 4"  (1.626 m)   Body mass index is 23.28 kg/m.  Advanced Directives 12/31/2021 12/11/2021 12/03/2021 11/28/2021 11/27/2021 11/11/2021 11/09/2021  Does Patient Have a Medical Advance Directive? Yes Yes Yes Yes No No Unable to assess, patient is non-responsive or altered mental status  Type of Paramedic of [place];Living will [place];Living will - - -  Does patient want to make changes to medical advance directive? No - Patient declined No - Patient declined No - Patient declined No - Patient declined - - -  Copy of [place] in Chart? Yes - validated most recent copy scanned in chart (See row information) Yes - validated most recent copy scanned in chart (See row information) Yes - validated most recent copy scanned in chart (See row information) No - copy requested, Physician notified - - -  Would patient like information on creating a medical advance directive? - - - - No - Patient declined - -  Pre-existing out of facility DNR order (yellow form or pink MOST form) - - - - - - -  Some encounter information is confidential and restricted. Go to Review Flowsheets activity to see all data.    Current Medications (verified) Outpatient Encounter Medications as of 12/31/2021  Medication Sig   acetaminophen (TYLENOL) 325 MG tablet Take 650 mg by mouth every 6 (six) hours as needed.   albuterol (VENTOLIN HFA) 108 (90 Base) MCG/ACT inhaler Inhale 2 puffs into the lungs every 4  (four) hours as needed for wheezing or shortness of breath.   allopurinol (ZYLOPRIM) 100 MG tablet Take 50 mg by mouth daily. Idiopathic gout   aspirin 81 MG chewable tablet Chew 81 mg by mouth daily.   bisacodyl (DULCOLAX) 10 MG suppository Place 10 mg rectally daily as needed (constipation not relieved by MOM).   Calcium Carb-Cholecalciferol (OYSTER SHELL CALCIUM W/D) 500-200 MG-UNIT TABS Take 1 tablet by mouth 2 (two) times daily.   clonazePAM (KLONOPIN) 0.5 MG tablet Take 0.5 mg by mouth.   colchicine 0.6 MG tablet Take 1 tablet (0.6 mg total) by mouth daily.   colestipol (COLESTID) 1 g tablet TAKE 2 TABLETS DAILY FOR DIARRHEA. DO NOT TAKE WITHIN 2 HOURS OF OTHER ORAL MEDICATIONS. HOLD FOR CONSTIPATION.   diltiazem (CARDIZEM CD) 180 MG 24 hr capsule Take 180 mg by mouth daily.   divalproex (DEPAKOTE ER) 500 MG 24 hr tablet Take 1,500 mg by mouth at bedtime. FOR MOOD/BEHAVIORS   enoxaparin (LOVENOX) 40 MG/0.4ML injection Inject 40 mg into the skin daily.   furosemide (LASIX) 20 MG tablet Take 1 tablet (20 mg total) by mouth every other day.   insulin aspart (NOVOLOG) 100 UNIT/ML injection Inject 5 Units into the skin 3 (three) times daily. For CBG >200   insulin detemir (LEVEMIR) 100 UNIT/ML injection Inject 0.12 mLs (12 Units total) into the skin 2 (two) times daily.   levothyroxine (SYNTHROID) 150 MCG tablet Take 150 mcg by mouth daily before breakfast.  magnesium hydroxide (MILK OF MAGNESIA) 400 MG/5ML suspension Take 30 mLs by mouth daily as needed (if no BM in 3 days).   mirtazapine (REMERON) 15 MG tablet Take 15 mg by mouth at bedtime.   nicotine (NICODERM CQ - DOSED IN MG/24 HOURS) 14 mg/24hr patch Place 14 mg onto the skin daily. For 6 weeks 7MG /24 HR PATCH APPLY 1 PATCH TRANSDERMALLY ONCE DAILY FOR (14) DAYS FOR SMOKNIG CESSATION   NON FORMULARY Diet:Mech soft/thin diet consistency   Nutritional Supplements (NUTRITIONAL SUPPLEMENT PO) Take 1 each by mouth daily. Magic Cup    ondansetron (ZOFRAN) 4 MG tablet Take 1 tablet (4 mg total) by mouth every 8 (eight) hours as needed for nausea or vomiting.   pantoprazole (PROTONIX) 40 MG tablet TAKE 1 TABLET BY MOUTH ONCE DAILY 30 MINTUES BEFORE BREAKFAST.   PARoxetine (PAXIL) 40 MG tablet Take 1 tablet (40 mg total) by mouth daily.   QUETIAPINE FUMARATE ER PO Take 200 mg by mouth at bedtime.   rosuvastatin (CRESTOR) 20 MG tablet Take 20 mg by mouth every evening.   senna-docusate (SENOKOT-S) 8.6-50 MG tablet Take 1 tablet by mouth at bedtime as needed for mild constipation.   Sodium Phosphates (RA SALINE ENEMA RE) Place 1 Dose rectally once as needed (severe constipation). If no relief from both milk of magnesia, and Bisacodyl suppository, administer one dose of disposable saline enema.   vitamin B-12 (CYANOCOBALAMIN) 500 MCG tablet Take 500 mcg by mouth daily.   [DISCONTINUED] divalproex (DEPAKOTE ER) 500 MG 24 hr tablet Take 2 tablets (1,000 mg total) by mouth every evening.   [DISCONTINUED] divalproex (DEPAKOTE) 250 MG DR tablet Take 1 tablet (250 mg total) by mouth daily.   [DISCONTINUED] levothyroxine (SYNTHROID) 200 MCG tablet Take 200 mcg by mouth daily before breakfast.   [DISCONTINUED] LORazepam (ATIVAN) 0.5 MG tablet Take 0.5 mg by mouth 2 (two) times daily as needed for anxiety.   [DISCONTINUED] QUEtiapine (SEROQUEL) 100 MG tablet Take 1 tablet (100 mg total) by mouth at bedtime.   [DISCONTINUED] sertraline (ZOLOFT) 25 MG tablet Take 25 mg by mouth daily.   No facility-administered encounter medications on file as of 12/31/2021.    Allergies (verified) Patient has no known allergies.   History: Past Medical History:  Diagnosis Date   Arthritis    COPD (chronic obstructive pulmonary disease) ([place])    Diverticulosis    Essential hypertension, benign    Hx of colonic polyps    Hypothyroidism    IBS (irritable bowel syndrome)    Ketoacidosis    Mixed hyperlipidemia    PTSD (post-traumatic stress disorder)     S/P colonoscopy 11/2009   RMR: pancolonic diverticula, polypoid rectal mucosa with  prominent lymphoid aggregates, repeat in Jan 2016    Stress incontinence, female    Tobacco use    Type 2 diabetes mellitus ([place])    Past Surgical History:  Procedure Laterality Date   Bilateral tubal ligation     BREAST BIOPSY Left 10/16/2014   negative   CHOLECYSTECTOMY  09/11/2012   Procedure: LAPAROSCOPIC CHOLECYSTECTOMY;  Surgeon: Jamesetta So, MD;  Location: [facility];  Service: General;  Laterality: N/A;   COLONOSCOPY  11/17/2009   Dr. Gala Romney: normal rectum, pancolonic diverticula, polyps benign, no adenomas.    COLONOSCOPY N/A 02/12/2015   Dr. Gala Romney: colonic diverticulosis, multiple colonic polyps (tubular adenomas), negative segmental biopsies. Surveillance in 2021   ESOPHAGOGASTRODUODENOSCOPY  11/23/2011   Dr. Gala Romney: Erosive reflux esophagitis; small hiatal hernia; esophagus dilated empirically  MALONEY DILATION  11/23/2011   Procedure: Venia Minks DILATION;  Surgeon: Daneil Dolin, MD;  Location: [facility];  Service: Endoscopy;  Laterality: N/A;   PERICARDIOCENTESIS N/A 11/06/2021   Procedure: PERICARDIOCENTESIS;  Surgeon: Jettie Booze, MD;  Location: [facility];  Service: Cardiovascular;  Laterality: N/A;   SKIN LESION EXCISION     on buttocks   TUBAL LIGATION     Family History  Problem Relation Age of Onset   Cancer Mother        unsure what type   Heart disease Father    Heart attack Father    Schizophrenia Brother    Stroke Brother    Cancer Son        bladder   Coronary artery disease Other    Diabetes type II Other    Colon cancer Neg Hx    Breast cancer Neg Hx    Social History   Socioeconomic History   Marital status: Legally Separated    Spouse name: Not on file   Number of children: Not on file   Years of education: Not on file   Highest education level: Not on file  Occupational History   Not on file  Tobacco Use   Smoking status: Every Day     Packs/day: 2.00    Years: 43.00    Pack years: 86.00    Types: Cigarettes   Smokeless tobacco: Never  Substance and Sexual Activity   Alcohol use: No    Alcohol/week: 0.0 standard drinks   Drug use: No   Sexual activity: Not Currently    Birth control/protection: Surgical, Post-menopausal    Comment: tubal  Other Topics Concern   Not on file  Social History Narrative   Not on file   Social Determinants of Health   Financial Resource Strain: Not on file  Food Insecurity: Not on file  Transportation Needs: Not on file  Physical Activity: Not on file  Stress: Not on file  Social Connections: Not on file    Tobacco Counseling Ready to quit: Not Answered Counseling given: Not Answered   Clinical Intake:  Pre-visit preparation completed: No  Pain : No/denies pain     BMI - recorded: 23.28 Nutritional Status: BMI of 19-24  Normal Diabetes: Yes CBG done?: Yes CBG resulted in Enter/ Edit results?: Yes Did pt. bring in CBG monitor from home?: Yes Glucose Meter Downloaded?: Yes  How often do you need to have someone help you when you read instructions, pamphlets, or other written materials from your doctor or pharmacy?: 5 - Always What is the last grade level you completed in school?: High school graduate  Diabetic?Yes  Interpreter Needed?: No  Information entered by :: Percy Comp Medina-Vargas DNP   Activities of Daily Living In your present state of health, do you have any difficulty performing the following activities: 12/31/2021 11/09/2021  Hearing? N N  Vision? N N  Difficulty concentrating or making decisions? [name]  Walking or climbing stairs? Y Y  Dressing or bathing? Y Y  Doing errands, shopping? [name]  Preparing Food and eating ? Y -  Using the Toilet? Y -  In the past six months, have you accidently leaked urine? Y -  Do you have problems with loss of bowel control? Y -  Managing your Medications? Y -  Managing your Finances? Y -  Housekeeping or managing  your Housekeeping? Y -  Some recent data might be hidden    Patient Care  Team: Hendricks Limes, MD as PCP - General (Internal Medicine) Carrolyn Meiers, MD (Internal Medicine) Satira Sark, MD (Cardiology) [name], MD (Gastroenterology) Medina-Vargas, Senaida Lange, NP as Nurse Practitioner (Internal Medicine)  Indicate any recent Medical Services you may have received from other than [facility] providers in the past year (date may be approximate).     Assessment:   This is a routine wellness examination for Claudia Keller.  Hearing/Vision screen Hearing Screening - Comments:: No hearing problem hearing. Vision Screening - Comments:: Referred to ophthalmology.  Dietary issues and exercise activities discussed: Current Exercise Habits: Structured exercise class, Type of exercise: stretching;strength training/weights, Time (Minutes): 20, Frequency (Times/Week): 5, Weekly Exercise (Minutes/Week): 100, Intensity: Mild, Exercise limited by: neurologic condition(s)   Goals Addressed             This Visit's Progress    LIFESTYLE - DECREASE FALLS RISK        Will need to ask for help whenever she wanted to get back to bed. Be more aware of what is going on  Be more patient when asking for help.      Depression Screen PHQ 2/9 Scores 12/31/2021 01/12/2021 09/15/2016 09/07/2016 08/17/2016 09/22/2015  PHQ - 2 Score 2 0 1 0 0 0  PHQ- 9 Score 4 - - - - -  Some encounter information is confidential and restricted. Go to Review Flowsheets activity to see all data.    Fall Risk Fall Risk  12/31/2021 05/20/2021 01/12/2021 09/07/2016 08/17/2016  Falls in the past year? 1 1 1  No No  Number falls in past yr: 1 0 1 - -  Comment - 05/15/2021 - - -  Injury with Fall? 0 0 1 - -  Risk for fall due to : History of fall(s);Impaired mobility - History of fall(s);Impaired balance/gait;Impaired mobility - -  Follow up Education provided;Falls prevention discussed - Falls evaluation completed - -     FALL RISK PREVENTION PERTAINING TO THE HOME:  Any stairs in or around the home? No  If so, are there any without handrails? No  Home free of loose throw rugs in walkways, pet beds, electrical cords, etc? Yes  Adequate lighting in your home to reduce risk of falls? Yes   ASSISTIVE DEVICES UTILIZED TO PREVENT FALLS:  Life alert? No  Use of a cane, walker or w/c? Yes  Grab bars in the bathroom? Yes  Shower chair or bench in shower? Yes  Elevated toilet seat or a handicapped toilet? Yes   TIMED UP AND GO:  Was the test performed? No .  Length of time to ambulate 10 feet: N/A sec.   Gait unsteady with use of assistive device, provider informed and education provided.   Cognitive Function: MMSE - Mini Mental State Exam 12/31/2021  Orientation to time 1  Orientation to Place 0  Registration 3  Attention/ Calculation 5  Recall 3  Language- name 2 objects 2  Language- repeat 1  Language- follow 3 step command 3  Language- read & follow direction 1  Write a sentence 0  Copy design 0  Total score 19     6CIT Screen 12/31/2021  What Year? 4 points  What month? 3 points  What time? 3 points  Count back from 20 4 points  Months in reverse 4 points  Repeat phrase 10 points  Total Score 28    Immunizations Immunization History  Administered Date(s) Administered   Fluad Quad(high Dose 65+) 07/09/2021   Moderna Sars-Covid-2 Vaccination  11/16/2019, 12/10/2019   Tdap 07/09/2021   Zoster Recombinat (Shingrix) 07/09/2021    TDAP status: Up to date  Flu Vaccine status: Up to date  Pneumococcal vaccine status: Declined,  Education has been provided regarding the importance of this vaccine but patient still declined. Advised may receive this vaccine at local pharmacy or Health Dept. Aware to provide a copy of the vaccination record if obtained from local pharmacy or Health Dept. Verbalized acceptance and understanding.   Covid-19 vaccine status: Information provided on how  to obtain vaccines.   Qualifies for Shingles Vaccine? Yes   Zostavax completed Yes   Shingrix Completed?: No.    Education has been provided regarding the importance of this vaccine. Patient has been advised to call insurance company to determine out of pocket expense if they have not yet received this vaccine. Advised may also receive vaccine at local pharmacy or Health Dept. Verbalized acceptance and understanding.  Screening Tests Health Maintenance  Topic Date Due   OPHTHALMOLOGY EXAM  Never done   COVID-19 Vaccine (3 - Booster for Moderna series) 02/04/2020   Zoster Vaccines- Shingrix (2 of 2) 09/03/2021   HEMOGLOBIN A1C  05/06/2022   FOOT EXAM  08/04/2022   URINE MICROALBUMIN  11/20/2022   MAMMOGRAM  10/15/2023   COLONOSCOPY (Pts 45-13yrs Insurance coverage will need to be confirmed)  02/11/2025   TETANUS/TDAP  07/10/2031   Pneumonia Vaccine 56+ Years old  Completed   INFLUENZA VACCINE  Completed   DEXA SCAN  Completed   Hepatitis C Screening  Completed   HPV VACCINES  Aged Out    Health Maintenance  Health Maintenance Due  Topic Date Due   OPHTHALMOLOGY EXAM  Never done   COVID-19 Vaccine (3 - Booster for Moderna series) 02/04/2020   Zoster Vaccines- Shingrix (2 of 2) 09/03/2021    Colorectal cancer screening: Type of screening: Colonoscopy. Completed 2016. Repeat every 100 years  Mammogram status: Completed Cannot sit up due to stroke. Repeat every year  Bone Density status: Ordered 12/31/21. Pt provided with contact info and advised to call to schedule appt.  Lung Cancer Screening: (Low Dose CT Chest recommended if Age 49-80 years, 30 pack-year currently smoking OR have quit w/in 15years.) does not qualify. CT chest done on 11/04/22  Lung Cancer Screening Referral: No  Additional Screening:  Hepatitis C Screening: does qualify; Completed ordered 12/31/21  Vision Screening: Recommended annual ophthalmology exams for early detection of glaucoma and other disorders  of the eye. Is the patient up to date with their annual eye exam?  No  Who is the provider or what is the name of the office in which the patient attends annual eye exams? None If pt is not established with a provider, would they like to be referred to a provider to establish care? Yes .   Dental Screening: Recommended annual dental exams for proper oral hygiene  Community Resource Referral / Chronic Care Management: CRR required this visit?  No   CCM required this visit?  No      Plan:     I have personally reviewed and noted the following in the patients chart:   Medical and social history Use of alcohol, tobacco or illicit drugs  Current medications and supplements including opioid prescriptions.  Functional ability and status Nutritional status Physical activity Advanced directives List of other physicians Hospitalizations, surgeries, and ER visits in previous 12 months Vitals Screenings to include cognitive, depression, and falls Referrals and appointments  In addition, I have  reviewed and discussed with patient certain preventive protocols, quality metrics, and best practice recommendations. A written personalized care plan for preventive services as well as general preventive health recommendations were provided to patient.     Durenda Age, NP   12/31/2021   Nurse Notes:  Will need to have annual wellness visit.

## 2022-01-01 LAB — HM HEPATITIS C SCREENING LAB: HM Hepatitis Screen: NEGATIVE

## 2022-01-08 ENCOUNTER — Ambulatory Visit: Payer: Self-pay | Admitting: Physician Assistant

## 2022-01-10 NOTE — Progress Notes (Signed)
Office Visit    Patient Name: Claudia Keller Date of Encounter: 01/12/2022  PCP:  No primary care provider on file.   [facility]  Cardiologist:  Freada Bergeron, MD  Advanced Practice Provider:  No care team member to display Electrophysiologist:  None   Chief Complaint    Claudia Keller is a 71 y.o. female with a hx of type 2 diabetes mellitus, COPD, hypothyroidism, hypertension, history of smoking, PAF, history of CVA, hyperlipidemia presents today for hospital follow-up visit.  Patient was last seen by Dr. Domenic Polite in August 2012 for chest pain.  EKG was nonspecific at this time.  She had not undergone any prior ischemic work-up prior to that time.  Smoking cessation was emphasized.  Echocardiogram and Myoview were ordered.  Echocardiogram revealed borderline LVH with normal LV function 60 to 65%.  Mildly increased RV wall thickness and normal cavity size.  Her stress test was probably negative revealing impaired exercise capacity and normal LV size, normal LV systolic function and no significant stress-induced EKG abnormalities.  Overall, no convincing evidence of significant ischemia.  More recently, December 02, 2021 she presented to the ED with complaint of a fall.  She reports that she was bending over to pick something up off the floor, lost her balance and fell forward onto the ground.  She states that she "almost passed out".  She is not on any blood thinners.  No chest pain prior to the fall and no palpitations.  After her fall she reports she was unable to get up on her own.  The patient resides at [facility].  A thorough work-up was performed such as a CT scan of the head, C-spine, and face, x-ray of the chest, x-ray of the humerus.  No acute processes were found.  Cardiac monitoring revealed sinus tachycardia.  This was likely a mechanical fall.  Patient does have history of dementia.  Troponin was negative with no chest pain.  Labs were stable, no  seizure-like activity was reported.  She did have an echocardiogram performed on 11/20/2021 which showed a very small pericardial effusion.  Recommended follow-up echocardiogram in 3 to 4 months to make sure that this does not recur.  Today, she presents today from [place] and cannot hold herself up in the chair. We confirmed with the facility that this is her baseline after her stroke as well as having dementia and several other psych diagnosis.  She was recently started on a few new psych medications and this is the reason for her shaking. Poor historian, and continued to say "help me" through the encounter. She was telling that that she fell the other day and hit her head. Confirmed with the facility that this was when she was in the hospital back in January. She went to the hospital and the proper work-up was done at that time (listed above). She denies SOB and palpitations. She states she no longer smokes. She states she did have some chest pain a few days ago but can't really explain any other details.   Reports no shortness of breath nor dyspnea on exertion. No edema, orthopnea, PND. Reports no palpitations.    Past Medical History    Past Medical History:  Diagnosis Date   Arthritis    COPD (chronic obstructive pulmonary disease) ([place])    Diverticulosis    Essential hypertension, benign    Hx of colonic polyps    Hypothyroidism    IBS (irritable bowel syndrome)  Ketoacidosis    Mixed hyperlipidemia    PTSD (post-traumatic stress disorder)    S/P colonoscopy 11/2009   RMR: pancolonic diverticula, polypoid rectal mucosa with  prominent lymphoid aggregates, repeat in Jan 2016    Stress incontinence, female    Tobacco use    Type 2 diabetes mellitus ([place])    Past Surgical History:  Procedure Laterality Date   Bilateral tubal ligation     BREAST BIOPSY Left 10/16/2014   negative   CHOLECYSTECTOMY  09/11/2012   Procedure: LAPAROSCOPIC CHOLECYSTECTOMY;  Surgeon: Jamesetta So, MD;   Location: [facility];  Service: General;  Laterality: N/A;   COLONOSCOPY  11/17/2009   Dr. Gala Romney: normal rectum, pancolonic diverticula, polyps benign, no adenomas.    COLONOSCOPY N/A 02/12/2015   Dr. Gala Romney: colonic diverticulosis, multiple colonic polyps (tubular adenomas), negative segmental biopsies. Surveillance in 2021   ESOPHAGOGASTRODUODENOSCOPY  11/23/2011   Dr. Gala Romney: Erosive reflux esophagitis; small hiatal hernia; esophagus dilated empirically   MALONEY DILATION  11/23/2011   Procedure: Venia Minks DILATION;  Surgeon: Daneil Dolin, MD;  Location: [facility];  Service: Endoscopy;  Laterality: N/A;   PERICARDIOCENTESIS N/A 11/06/2021   Procedure: PERICARDIOCENTESIS;  Surgeon: Jettie Booze, MD;  Location: [facility];  Service: Cardiovascular;  Laterality: N/A;   SKIN LESION EXCISION     on buttocks   TUBAL LIGATION      Allergies  No Known Allergies   EKGs/Labs/Other Studies Reviewed:   The following studies were reviewed today: IMPRESSIONS     1. Left ventricular ejection fraction, by estimation, is 65 to 70%. The  left ventricle has normal function.   Comparison(s): 11/09/21 EF 70-75%. Small pericardial effusion.   FINDINGS   Left Ventricle: Left ventricular ejection fraction, by estimation, is 65  to 70%. The left ventricle has normal function. The left ventricular  internal cavity size was normal in size.   Pericardium: Trivial pericardial effusion is present.   Aorta: The aortic root and ascending aorta are structurally normal, with  no evidence of dilitation.   11/06/2021  Successful pericardiocentesis removing 420 cc of bloody fluid.   Await cytology results and other laboratory analysis of the pericardial fluid. Consider removing pericardial drain tomorrow depending on the output.   Results conveyed to son, Charlotte Crumb.  EKG:  EKG is not ordered today.    Recent Labs: 11/04/2021: B Natriuretic Peptide 247.7 11/06/2021: Magnesium 1.7 12/14/2021:  TSH 0.14 12/29/2021: ALT 11; BUN 23; Creatinine 0.7; Hemoglobin 10.9; Platelets 262; Potassium 4.1; Sodium 137  Recent Lipid Panel    Component Value Date/Time   CHOL 275 (H) 09/03/2021 0357   TRIG 169 (H) 09/03/2021 0357   HDL 39 (L) 09/03/2021 0357   CHOLHDL 7.1 09/03/2021 0357   VLDL 34 09/03/2021 0357   LDLCALC 202 (H) 09/03/2021 0357   LDLCALC 81 08/14/2020 1042    Risk Assessment/Calculations:   CHA2DS2-VASc Score = 8   This indicates a 10.8% annual risk of stroke. The patient's score is based upon: CHF History: 1 HTN History: 1 Diabetes History: 1 Stroke History: 2 Vascular Disease History: 1 Age Score: 1 Gender Score: 1     Home Medications   Current Meds  Medication Sig   acetaminophen (TYLENOL) 325 MG tablet Take 650 mg by mouth every 6 (six) hours as needed.   albuterol (VENTOLIN HFA) 108 (90 Base) MCG/ACT inhaler Inhale 2 puffs into the lungs every 4 (four) hours as needed for wheezing or shortness of breath.   allopurinol (  ZYLOPRIM) 100 MG tablet Take 50 mg by mouth daily. Idiopathic gout   aspirin 81 MG chewable tablet Chew 81 mg by mouth daily.   bisacodyl (DULCOLAX) 10 MG suppository Place 10 mg rectally daily as needed (constipation not relieved by MOM).   Calcium Carb-Cholecalciferol (OYSTER SHELL CALCIUM W/D) 500-200 MG-UNIT TABS Take 1 tablet by mouth 2 (two) times daily.   clonazePAM (KLONOPIN) 0.5 MG tablet Take 0.5 mg by mouth.   colchicine 0.6 MG tablet Take 1 tablet (0.6 mg total) by mouth daily.   colestipol (COLESTID) 1 g tablet TAKE 2 TABLETS DAILY FOR DIARRHEA. DO NOT TAKE WITHIN 2 HOURS OF OTHER ORAL MEDICATIONS. HOLD FOR CONSTIPATION.   diltiazem (CARDIZEM CD) 180 MG 24 hr capsule Take 180 mg by mouth daily.   divalproex (DEPAKOTE ER) 500 MG 24 hr tablet Take 1,500 mg by mouth at bedtime. FOR MOOD/BEHAVIORS   enoxaparin (LOVENOX) 40 MG/0.4ML injection Inject 40 mg into the skin daily.   feeding supplement, GLUCERNA SHAKE, (GLUCERNA SHAKE)  LIQD Take 237 mLs by mouth daily.   furosemide (LASIX) 20 MG tablet Take 1 tablet (20 mg total) by mouth every other day.   insulin aspart (NOVOLOG) 100 UNIT/ML injection Inject 5 Units into the skin 3 (three) times daily. For CBG >200   insulin detemir (LEVEMIR) 100 UNIT/ML injection Inject 0.12 mLs (12 Units total) into the skin 2 (two) times daily.   levothyroxine (SYNTHROID) 150 MCG tablet Take 150 mcg by mouth daily before breakfast.   magnesium hydroxide (MILK OF MAGNESIA) 400 MG/5ML suspension Take 30 mLs by mouth daily as needed (if no BM in 3 days).   mirtazapine (REMERON) 15 MG tablet Take 15 mg by mouth at bedtime.   nicotine (NICODERM CQ - DOSED IN MG/24 HOURS) 14 mg/24hr patch Place 14 mg onto the skin daily. For 6 weeks, along with '7MG'$ /24 HR PATCH APPLY 1 PATCH TRANSDERMALLY ONCE DAILY FOR (14) DAYS FOR SMOKNIG CESSATION (starting on 01/16/2022)   NON FORMULARY Diet:Mech soft/thin diet consistency   Nutritional Supplements (NUTRITIONAL SUPPLEMENT PO) Take 1 each by mouth daily. Magic Cup   ondansetron (ZOFRAN) 4 MG tablet Take 1 tablet (4 mg total) by mouth every 8 (eight) hours as needed for nausea or vomiting.   pantoprazole (PROTONIX) 40 MG tablet TAKE 1 TABLET BY MOUTH ONCE DAILY 30 MINTUES BEFORE BREAKFAST.   PARoxetine (PAXIL) 40 MG tablet Take 1 tablet (40 mg total) by mouth daily.   QUETIAPINE FUMARATE ER PO Take 200 mg by mouth at bedtime.   rosuvastatin (CRESTOR) 40 MG tablet Take 1 tablet (40 mg total) by mouth daily.   senna-docusate (SENOKOT-S) 8.6-50 MG tablet Take 1 tablet by mouth at bedtime as needed for mild constipation.   Sodium Phosphates (RA SALINE ENEMA RE) Place 1 Dose rectally once as needed (severe constipation). If no relief from both milk of magnesia, and Bisacodyl suppository, administer one dose of disposable saline enema.   vitamin B-12 (CYANOCOBALAMIN) 500 MCG tablet Take 500 mcg by mouth daily.   [DISCONTINUED] rosuvastatin (CRESTOR) 20 MG tablet Take  20 mg by mouth every evening.     Review of Systems      All other systems reviewed and are otherwise negative except as noted above.  Physical Exam    VS:  BP 124/80    Pulse 75    Ht '5\' 4"'$  (1.626 m) Comment: [facility]   Wt 149 lb 3.2 oz (67.7 kg) Comment: from [facility]  SpO2 95%    BMI 25.61 kg/m  , BMI Body mass index is 25.61 kg/m.  Wt Readings from Last 3 Encounters:  01/12/22 149 lb 3.2 oz (67.7 kg)  01/12/22 122 lb 6.4 oz (55.5 kg)  12/31/21 135 lb 9.6 oz (61.5 kg)     GEN: Well nourished, well developed, in no acute distress. HEENT: normal. Neck: Supple, no JVD, carotid bruits, or masses. Cardiac:RRR, no murmurs, rubs, or gallops. No clubbing, cyanosis, edema.  Radials/PT 2+ and equal bilaterally.  Respiratory:  Respirations regular and unlabored, clear to auscultation bilaterally. GI: Soft, nontender, nondistended. MS: No deformity or atrophy. Skin: Warm and dry, no rash. Neuro:  Strength and sensation are intact. Psych: Normal affect.  Assessment & Plan    Pericardial effusion -Will order a repeat Echocardiogram to make sure pericardial effusion has resolved. This will have to be done at the hospital since she cannot move without a 2 person assist -Difficult to obtain a history on any symptoms due to dementia/CVA/psych  PAF -Used to be on Eliquis but had too many falls so this was discontinued -She is taking diltiazem extended release 180 mg -Appears well controlled  Hypertension -Well controlled today in the clinic -Continue to monitor at [facility]  Hyperlipidemia -LDL 202 -increase crestor '40mg'$   -repeat Lipid panel and LFTs in 6-8 weeks  Smoking history -Does not smoke anymore  Type 2 diabetes mellitus -last A1C 7.8  -Care per PCP  History of CVA -Weakness in her left side, in a wheelchair today -Having difficultly holding herself in her chair -[facility] and her psych medications  were recently changed which might explain her continuous tremor     Disposition: Follow up 6 months with Freada Bergeron, MD or [facility].  Signed, Elgie Collard, PA-C 01/12/2022, 2:48 PM [facility]

## 2022-01-12 ENCOUNTER — Ambulatory Visit (INDEPENDENT_AMBULATORY_CARE_PROVIDER_SITE_OTHER): Payer: Medicare Other | Admitting: Physician Assistant

## 2022-01-12 ENCOUNTER — Non-Acute Institutional Stay (SKILLED_NURSING_FACILITY): Payer: Medicare Other | Admitting: Adult Health

## 2022-01-12 ENCOUNTER — Encounter: Payer: Self-pay | Admitting: Adult Health

## 2022-01-12 ENCOUNTER — Encounter: Payer: Self-pay | Admitting: Physician Assistant

## 2022-01-12 ENCOUNTER — Other Ambulatory Visit: Payer: Self-pay

## 2022-01-12 VITALS — BP 124/80 | HR 75 | Ht 64.0 in | Wt 149.2 lb

## 2022-01-12 DIAGNOSIS — I480 Paroxysmal atrial fibrillation: Secondary | ICD-10-CM | POA: Diagnosis not present

## 2022-01-12 DIAGNOSIS — E7800 Pure hypercholesterolemia, unspecified: Secondary | ICD-10-CM | POA: Diagnosis not present

## 2022-01-12 DIAGNOSIS — E039 Hypothyroidism, unspecified: Secondary | ICD-10-CM

## 2022-01-12 DIAGNOSIS — F419 Anxiety disorder, unspecified: Secondary | ICD-10-CM

## 2022-01-12 DIAGNOSIS — E782 Mixed hyperlipidemia: Secondary | ICD-10-CM

## 2022-01-12 DIAGNOSIS — Z794 Long term (current) use of insulin: Secondary | ICD-10-CM

## 2022-01-12 DIAGNOSIS — I10 Essential (primary) hypertension: Secondary | ICD-10-CM

## 2022-01-12 DIAGNOSIS — E118 Type 2 diabetes mellitus with unspecified complications: Secondary | ICD-10-CM

## 2022-01-12 DIAGNOSIS — Z87891 Personal history of nicotine dependence: Secondary | ICD-10-CM

## 2022-01-12 DIAGNOSIS — I69359 Hemiplegia and hemiparesis following cerebral infarction affecting unspecified side: Secondary | ICD-10-CM

## 2022-01-12 DIAGNOSIS — I3139 Other pericardial effusion (noninflammatory): Secondary | ICD-10-CM

## 2022-01-12 LAB — BASIC METABOLIC PANEL
BUN: 22 — AB (ref 4–21)
CO2: 28 — AB (ref 13–22)
Chloride: 101 (ref 99–108)
Creatinine: 0.7 (ref 0.5–1.1)
Glucose: 77
Potassium: 4.3 mEq/L (ref 3.5–5.1)
Sodium: 142 (ref 137–147)

## 2022-01-12 LAB — CBC AND DIFFERENTIAL
HCT: 36 (ref 36–46)
Hemoglobin: 11 — AB (ref 12.0–16.0)
Platelets: 225 10*3/uL (ref 150–400)
WBC: 11.5

## 2022-01-12 LAB — TSH: TSH: 0.44 (ref 0.41–5.90)

## 2022-01-12 LAB — CBC: RBC: 3.76 — AB (ref 3.87–5.11)

## 2022-01-12 LAB — COMPREHENSIVE METABOLIC PANEL: Calcium: 10.2 (ref 8.7–10.7)

## 2022-01-12 MED ORDER — ROSUVASTATIN CALCIUM 40 MG PO TABS: 40.0000 mg | ORAL_TABLET | Freq: Every day | ORAL | 3 refills | Status: DC

## 2022-01-12 NOTE — Patient Instructions (Addendum)
Medication Instructions:  ?Your physician has recommended you make the following change in your medication:  ?1.Increase rosuvastatin (Crestor) to 40 mg, take one tablet by mouth daily. ?*If you need a refill on your cardiac medications before your next appointment, please call your pharmacy* ? ? ?Lab Work: ?Lipids and lft's in 6-8 weeks ?If you have labs (blood work) drawn today and your tests are completely normal, you will receive your results only by: ?MyChart Message (if you have MyChart) OR ?A paper copy in the mail ?If you have any lab test that is abnormal or we need to change your treatment, we will call you to review the results. ? ? ?Testing/Procedures: ?Your physician has requested that you have an echocardiogram. Echocardiography is a painless test that uses sound waves to create images of your heart. It provides your doctor with information about the size and shape of your heart and how well your heart?s chambers and valves are working. This procedure takes approximately one hour. There are no restrictions for this procedure.  ? ? ?Follow-Up: ?At [facility], you and your health needs are our priority.  As part of our continuing mission to provide you with exceptional heart care, we have created designated Provider Care Teams.  These Care Teams include your primary Cardiologist (physician) and Advanced Practice Providers (APPs -  Physician Assistants and Nurse Practitioners) who all work together to provide you with the care you need, when you need it. ? ? ?Your next appointment:   ?6 month(s) ? ?The format for your next appointment:   ?In Person ? ?Provider:   ?Gwyndolyn Kaufman, MD ?{ ? ? ?

## 2022-01-12 NOTE — Progress Notes (Signed)
Location:  [facility] Room Number: 387-F Place of Service:  SNF (31) Provider:  Durenda Age, DNP, FNP-BC  Patient Care Team: Hendricks Limes, MD as PCP - General (Internal Medicine) Satira Sark, MD as PCP - Cardiology (Cardiology) Carrolyn Meiers, MD (Internal Medicine) Satira Sark, MD (Cardiology) [name], MD (Gastroenterology) [name], NP as Nurse Practitioner (Internal Medicine)  Extended Emergency Contact Information Primary Emergency Contact: [facility] Address: [address]          [place], [place] [address] [name] of [place] Phone: [phone] Relation: Son Secondary Emergency Contact: [place] Phone: [phone] Relation: Other  Code Status:  Full Code   Goals of care: Advanced Directive information Advanced Directives 01/12/2022  Does Patient Have a Medical Advance Directive? Yes  Type of Advance Directive [place]  Does patient want to make changes to medical advance directive? Yes (ED - send information to MyChart)  Copy of [place] in Chart? Yes - validated most recent copy scanned in chart (See row information)  Would patient like information on creating a medical advance directive? -  Pre-existing out of facility DNR order (yellow form or pink MOST form) -  Some encounter information is confidential and restricted. Go to Review Flowsheets activity to see all data.     Chief Complaint  Patient presents with   Acute Visit    Anxiety and tachycardia    HPI:  Pt is a 71 y.o. female seen today for an acute visit. Staff reported that she was trying to get out of her wheelchair and was having tremors. She used to have orders for Clonazepam 0.5 mg Q12 hours PRN for anxiety but was discontinued after 14 days. She was given Clonazepam 0.5 mg  PO X 1 and was able to calm down. HR was 111. Review of HRs noted to be ranging from 71 to  100. She is currently taking  Diltiazem 180 mg 24 hour 1 capsule daily and Lovenox 40 mg injection daily for PAF. She used to take Eliquis for anticoagulation but had been having frequent falls and was discontinued. Latest tsh 0.14 (12/14/21), elevated, so her Levothyroxine was decreased to 150 mcg from 200 mcg daily. She was given Metoprolol 12.5 mg PO X 1 which later on decreased her hr to 99 after an hour.   Past Medical History:  Diagnosis Date   Arthritis    COPD (chronic obstructive pulmonary disease) ([place])    Diverticulosis    Essential hypertension, benign    Hx of colonic polyps    Hypothyroidism    IBS (irritable bowel syndrome)    Ketoacidosis    Mixed hyperlipidemia    PTSD (post-traumatic stress disorder)    S/P colonoscopy 11/2009   RMR: pancolonic diverticula, polypoid rectal mucosa with  prominent lymphoid aggregates, repeat in Jan 2016    Stress incontinence, female    Tobacco use    Type 2 diabetes mellitus ([place])    Past Surgical History:  Procedure Laterality Date   Bilateral tubal ligation     BREAST BIOPSY Left 10/16/2014   negative   CHOLECYSTECTOMY  09/11/2012   Procedure: LAPAROSCOPIC CHOLECYSTECTOMY;  Surgeon: Jamesetta So, MD;  Location: [facility];  Service: General;  Laterality: N/A;   COLONOSCOPY  11/17/2009   Dr. Gala Romney: normal rectum, pancolonic diverticula, polyps benign, no adenomas.    COLONOSCOPY N/A 02/12/2015   Dr. Gala Romney: colonic diverticulosis, multiple colonic polyps (tubular adenomas),  negative segmental biopsies. Surveillance in 2021   ESOPHAGOGASTRODUODENOSCOPY  11/23/2011   Dr. Gala Romney: Erosive reflux esophagitis; small hiatal hernia; esophagus dilated empirically   MALONEY DILATION  11/23/2011   Procedure: Venia Minks DILATION;  Surgeon: Daneil Dolin, MD;  Location: [facility];  Service: Endoscopy;  Laterality: N/A;   PERICARDIOCENTESIS N/A 11/06/2021   Procedure: PERICARDIOCENTESIS;  Surgeon: Jettie Booze, MD;  Location: [facility];   Service: Cardiovascular;  Laterality: N/A;   SKIN LESION EXCISION     on buttocks   TUBAL LIGATION      No Known Allergies  Outpatient Encounter Medications as of 01/12/2022  Medication Sig   acetaminophen (TYLENOL) 325 MG tablet Take 650 mg by mouth every 6 (six) hours as needed.   albuterol (VENTOLIN HFA) 108 (90 Base) MCG/ACT inhaler Inhale 2 puffs into the lungs every 4 (four) hours as needed for wheezing or shortness of breath.   allopurinol (ZYLOPRIM) 100 MG tablet Take 50 mg by mouth daily. Idiopathic gout   aspirin 81 MG chewable tablet Chew 81 mg by mouth daily.   bisacodyl (DULCOLAX) 10 MG suppository Place 10 mg rectally daily as needed (constipation not relieved by MOM).   Calcium Carb-Cholecalciferol (OYSTER SHELL CALCIUM W/D) 500-200 MG-UNIT TABS Take 1 tablet by mouth 2 (two) times daily.   clonazePAM (KLONOPIN) 0.5 MG tablet Take 0.5 mg by mouth.   colchicine 0.6 MG tablet Take 1 tablet (0.6 mg total) by mouth daily.   colestipol (COLESTID) 1 g tablet TAKE 2 TABLETS DAILY FOR DIARRHEA. DO NOT TAKE WITHIN 2 HOURS OF OTHER ORAL MEDICATIONS. HOLD FOR CONSTIPATION.   diltiazem (CARDIZEM CD) 180 MG 24 hr capsule Take 180 mg by mouth daily.   divalproex (DEPAKOTE ER) 500 MG 24 hr tablet Take 1,500 mg by mouth at bedtime. FOR MOOD/BEHAVIORS   enoxaparin (LOVENOX) 40 MG/0.4ML injection Inject 40 mg into the skin daily.   feeding supplement, GLUCERNA SHAKE, (GLUCERNA SHAKE) LIQD Take 237 mLs by mouth daily.   furosemide (LASIX) 20 MG tablet Take 1 tablet (20 mg total) by mouth every other day.   insulin aspart (NOVOLOG) 100 UNIT/ML injection Inject 5 Units into the skin 3 (three) times daily. For CBG >200   insulin detemir (LEVEMIR) 100 UNIT/ML injection Inject 0.12 mLs (12 Units total) into the skin 2 (two) times daily.   levothyroxine (SYNTHROID) 150 MCG tablet Take 150 mcg by mouth daily before breakfast.   magnesium hydroxide (MILK OF MAGNESIA) 400 MG/5ML suspension Take 30 mLs  by mouth daily as needed (if no BM in 3 days).   metoprolol tartrate (LOPRESSOR) 25 MG tablet Take 12.5 mg by mouth once. For elevated heart rate   mirtazapine (REMERON) 15 MG tablet Take 15 mg by mouth at bedtime.   nicotine (NICODERM CQ - DOSED IN MG/24 HOURS) 14 mg/24hr patch Place 14 mg onto the skin daily. For 6 weeks, along with '7MG'$ /24 HR PATCH APPLY 1 PATCH TRANSDERMALLY ONCE DAILY FOR (14) DAYS FOR SMOKNIG CESSATION (starting on 01/16/2022)   NON FORMULARY Diet:Mech soft/thin diet consistency   Nutritional Supplements (NUTRITIONAL SUPPLEMENT PO) Take 1 each by mouth daily. Magic Cup   ondansetron (ZOFRAN) 4 MG tablet Take 1 tablet (4 mg total) by mouth every 8 (eight) hours as needed for nausea or vomiting.   pantoprazole (PROTONIX) 40 MG tablet TAKE 1 TABLET BY MOUTH ONCE DAILY 30 MINTUES BEFORE BREAKFAST.   PARoxetine (PAXIL) 40 MG tablet Take 1 tablet (40 mg total) by mouth daily.  QUETIAPINE FUMARATE ER PO Take 200 mg by mouth at bedtime.   rosuvastatin (CRESTOR) 20 MG tablet Take 20 mg by mouth every evening.   senna-docusate (SENOKOT-S) 8.6-50 MG tablet Take 1 tablet by mouth at bedtime as needed for mild constipation.   Sodium Phosphates (RA SALINE ENEMA RE) Place 1 Dose rectally once as needed (severe constipation). If no relief from both milk of magnesia, and Bisacodyl suppository, administer one dose of disposable saline enema.   vitamin B-12 (CYANOCOBALAMIN) 500 MCG tablet Take 500 mcg by mouth daily.   No facility-administered encounter medications on file as of 01/12/2022.    Review of Systems  Constitutional:  Negative for appetite change, chills, fatigue and fever.  HENT:  Negative for congestion, hearing loss, rhinorrhea and sore throat.   Eyes: Negative.   Respiratory:  Negative for cough, shortness of breath and wheezing.   Cardiovascular:  Negative for chest pain, palpitations and leg swelling.  Gastrointestinal:  Negative for abdominal pain, constipation, diarrhea,  nausea and vomiting.  Genitourinary:  Negative for dysuria.  Musculoskeletal:  Negative for arthralgias, back pain and myalgias.  Skin:  Negative for color change, rash and wound.  Neurological:  Negative for dizziness, weakness and headaches.  Psychiatric/Behavioral:  Positive for behavioral problems. The patient is not nervous/anxious.       Immunization History  Administered Date(s) Administered   Fluad Quad(high Dose 65+) 07/09/2021   Moderna Sars-Covid-2 Vaccination 11/16/2019, 12/10/2019   Tdap 07/09/2021   Zoster Recombinat (Shingrix) 07/09/2021   Pertinent  Health Maintenance Due  Topic Date Due   OPHTHALMOLOGY EXAM  Never done   HEMOGLOBIN A1C  05/06/2022   FOOT EXAM  08/04/2022   URINE MICROALBUMIN  11/20/2022   MAMMOGRAM  10/15/2023   COLONOSCOPY (Pts 45-47yr Insurance coverage will need to be confirmed)  02/11/2025   INFLUENZA VACCINE  Completed   DEXA SCAN  Completed   Fall Risk 11/09/2021 11/10/2021 11/28/2021 12/02/2021 12/31/2021  Falls in the past year? - - - - 1  Number of falls in past year - - - - -  Was there an injury with Fall? - - - - 0  Fall Risk Category Calculator - - - - 2  Fall Risk Category - - - - Moderate  Patient Fall Risk Level High fall risk High fall risk High fall risk High fall risk Moderate fall risk  Patient at Risk for Falls Due to - - - - History of fall(s);Impaired mobility  Fall risk Follow up - - - - Education provided;Falls prevention discussed     Vitals:   01/12/22 1045  BP: 105/69  Pulse: 71  Resp: 18  Temp: (!) 97.3 F (36.3 C)  Weight: 122 lb 6.4 oz (55.5 kg)  Height: '5\' 4"'$  (1.626 m)   Body mass index is 21.01 kg/m.  Physical Exam Constitutional:      Appearance: Normal appearance.  HENT:     Head: Normocephalic and atraumatic.     Nose: Nose normal.     Mouth/Throat:     Mouth: Mucous membranes are moist.  Eyes:     Conjunctiva/sclera: Conjunctivae normal.  Cardiovascular:     Rate and Rhythm: Regular rhythm.  Tachycardia present.  Pulmonary:     Effort: Pulmonary effort is normal.     Breath sounds: Normal breath sounds.  Abdominal:     General: Bowel sounds are normal.     Palpations: Abdomen is soft.  Musculoskeletal:     Cervical back: Normal range of motion.  Skin:    General: Skin is warm and dry.  Neurological:     Mental Status: She is alert. Mental status is at baseline.     Comments: Left hemiplegia.  Psychiatric:     Comments: Was anxious but calmed down after given the Clonazepam.       Labs reviewed: Recent Labs    11/05/21 0425 11/06/21 0319 12/02/21 1208 12/14/21 0000 12/29/21 0000  NA 133* 136 134* 143 137  K 4.6 4.1 4.4 4.7 4.1  CL 96* 97* 101 105 97*  CO2 '26 31 26 '$ 29* 33*  GLUCOSE 210* 172* 229*  --   --   BUN 54* 44* 18 16 23*  CREATININE 1.05* 0.99 0.83 0.6 0.7  CALCIUM 9.1 10.3 9.5 9.6 9.5  MG  --  1.7  --   --   --   PHOS  --  2.6  --   --   --    Recent Labs    11/04/21 1959 11/05/21 0425 12/02/21 1208 12/29/21 0000  AST 42* 44* 16 12*  ALT '25 27 12 11  '$ ALKPHOS 70 61 59 120  BILITOT 0.7 1.1 0.5  --   PROT 6.2* 5.5* 5.8*  --   ALBUMIN 2.4* 2.2* 2.6* 2.8*   Recent Labs    11/04/21 1959 11/04/21 2006 11/05/21 0425 11/06/21 0319 12/02/21 1208 12/14/21 0000 12/29/21 0000  WBC 7.5  --  7.6 7.1 7.4 4.6 4.3  NEUTROABS 5.0  --   --  3.6 5.3  --   --   HGB 12.6   < > 10.7* 11.3* 12.3 10.6* 10.9*  HCT 38.1   < > 32.3* 33.3* 37.4 34* 33*  MCV 95.3  --  96.1 92.8 99.5  --   --   PLT 290  --  272 264 169 360 262   < > = values in this interval not displayed.   Lab Results  Component Value Date   TSH 0.14 (A) 12/14/2021   Lab Results  Component Value Date   HGBA1C 7.8 (H) 11/05/2021   Lab Results  Component Value Date   CHOL 275 (H) 09/03/2021   HDL 39 (L) 09/03/2021   LDLCALC 202 (H) 09/03/2021   TRIG 169 (H) 09/03/2021   CHOLHDL 7.1 09/03/2021    Significant Diagnostic Results in last 30 days:  No results  found.  Assessment/Plan  1. PAF (paroxysmal atrial fibrillation) (HCC) -  given Metoprolol 12.5 mg PO X 1 -  continue Diltiazem 24 HR  180 mg PO daily for rate control and Lovenox 40 mg daily for anticoagulation. - will follow up with cardiology today and will need to address if Eliquis is needed   2. Primary hypothyroidism Lab Results  Component Value Date   TSH 0.14 (A) 12/14/2021   -  continue Levothyroxine  3. Anxiety -   Clonazepam 0.5 mg Q 12 hours PRN X 14 days -  psych consult requested    Family/ staff Communication: Discussed plan of care with resident and charge nurse  Labs/tests ordered:   CBC, BMP and tsh    Durenda Age, DNP, MSN, FNP-BC [facility] [phone] (Monday-Friday 8:00 a.m. - 5:00 p.m.) [phone] (after hours)

## 2022-01-13 ENCOUNTER — Encounter: Payer: Self-pay | Admitting: Adult Health

## 2022-01-13 ENCOUNTER — Non-Acute Institutional Stay (SKILLED_NURSING_FACILITY): Payer: Medicare Other | Admitting: Adult Health

## 2022-01-13 DIAGNOSIS — F419 Anxiety disorder, unspecified: Secondary | ICD-10-CM | POA: Diagnosis not present

## 2022-01-13 DIAGNOSIS — F4310 Post-traumatic stress disorder, unspecified: Secondary | ICD-10-CM

## 2022-01-13 DIAGNOSIS — I480 Paroxysmal atrial fibrillation: Secondary | ICD-10-CM | POA: Diagnosis not present

## 2022-01-13 DIAGNOSIS — F319 Bipolar disorder, unspecified: Secondary | ICD-10-CM | POA: Diagnosis not present

## 2022-01-13 DIAGNOSIS — R630 Anorexia: Secondary | ICD-10-CM

## 2022-01-13 NOTE — Progress Notes (Signed)
Location:  [facility] Room Number: 217-B Place of Service:  SNF (31) Provider:  Kenard Gower, DNP, FNP-BC  Patient Care Team: Meriam Sprague, MD as PCP - Cardiology (Cardiology) Benetta Spar, MD (Internal Medicine) Jonelle Sidle, MD (Cardiology) Jena Gauss Gerrit Friends, MD (Gastroenterology) Gillis Santa, NP as Nurse Practitioner (Internal Medicine)  Extended Emergency Contact Information Primary Emergency Contact: [facility] Address: [address]          [place], [place] [address] D[place] of [place] Home Phone: [phone] Relation: Son Secondary Emergency Contact: Yow,Della Mobile Phone: [phone] Relation: Other  Code Status:  FULL CODE  Goals of care: Advanced Directive information Advanced Directives 01/13/2022  Does Patient Have a Medical Advance Directive? Yes  Type of Advance Directive Healthcare Power of Attorney  Does patient want to make changes to medical advance directive? No - Patient declined  Copy of Healthcare Power of Attorney in Chart? Yes - validated most recent copy scanned in chart (See row information)  Would patient like information on creating a medical advance directive? -  Pre-existing out of facility DNR order (yellow form or pink MOST form) -  Some encounter information is confidential and restricted. Go to Review Flowsheets activity to see all data.     Chief Complaint  Patient presents with   Acute Visit    Medication Management     HPI:  Pt is a 71 y.o. female seen today for medical management of chronic diseases.  She is a long-term care resident of [facility]. Resident has PAF and currently taking Diltiazem 24H ER 180 mg 1 capsule daily for rate control. She had multiple falls and Eliquis was held. She was started on Lovenox 40 mg SQ daily for anticoagulation until seen by cardiology.  She was seen by cardiology on 01/12/22. Eliquis was not restarted due to  history of multiple falls. She had a CHA2DS2-VASc score of 8 which indicates a 10.8% annual risk of stroke. Psych consult was obtained. She has been getting out of bed or transferring from wheelchair to bed repeatedly without asking for assistance.  She stated that she is getting out of bed to "take care of disabled son". Resident is a long-term care resident of [facility] and adult children does not live at the facility.  She has left-sided weakness  due to history of CVA and needing 2-person assistance.  Seroquel dosage was increased from 100 to 200 mg at bedtime on 12/22/2021 and Depakote was changed to ER 1500 mg at bedtime. She takes Paxil 40 mg daily for PTSD. Clonazepam 0.5 mg Q 12 hours PRN for anxiety was started.  Remeron 15 mg at bedtime was started to increase appetite. Latest weight is 122.4 lbs, down from 139.8 lbs last January 2023. The combination of these psychotropics seem to have  improved the falls/controlled agitation/anxiety/appetite.  Past Medical History:  Diagnosis Date   Arthritis    COPD (chronic obstructive pulmonary disease) (HCC)    Diverticulosis    Essential hypertension, benign    Hx of colonic polyps    Hypothyroidism    IBS (irritable bowel syndrome)    Ketoacidosis    Mixed hyperlipidemia    PTSD (post-traumatic stress disorder)    S/P colonoscopy 11/2009   RMR: pancolonic diverticula, polypoid rectal mucosa with  prominent lymphoid aggregates, repeat in Jan 2016    Stress incontinence, female    Tobacco use    Type 2 diabetes mellitus (HCC)    Past  Surgical History:  Procedure Laterality Date   Bilateral tubal ligation     BREAST BIOPSY Left 10/16/2014   negative   CHOLECYSTECTOMY  09/11/2012   Procedure: LAPAROSCOPIC CHOLECYSTECTOMY;  Surgeon: Dalia Heading, MD;  [facility]: [facility];  Service: General;  Laterality: N/A;   COLONOSCOPY  11/17/2009   Dr. Jena Gauss: normal rectum, pancolonic diverticula, polyps benign, no adenomas.    COLONOSCOPY N/A 02/12/2015    Dr. Jena Gauss: colonic diverticulosis, multiple colonic polyps (tubular adenomas), negative segmental biopsies. Surveillance in 2021   ESOPHAGOGASTRODUODENOSCOPY  11/23/2011   Dr. Jena Gauss: Erosive reflux esophagitis; small hiatal hernia; esophagus dilated empirically   MALONEY DILATION  11/23/2011   Procedure: Elease Hashimoto DILATION;  Surgeon: Corbin Ade, MD;  [facility]: [facility];  Service: Endoscopy;  Laterality: N/A;   PERICARDIOCENTESIS N/A 11/06/2021   Procedure: PERICARDIOCENTESIS;  Surgeon: Corky Crafts, MD;  Location: [facility] IN[facility] CV [facility];  Service: Cardiovascular;  Laterality: N/A;   SKIN LESION EXCISION     on buttocks   TUBAL LIGATION      No Known Allergies  Outpatient Encounter Medications as of 01/13/2022  Medication Sig   acetaminophen (TYLENOL) 325 MG tablet Take 650 mg by mouth every 6 (six) hours as needed.   albuterol (VENTOLIN HFA) 108 (90 Base) MCG/ACT inhaler Inhale 2 puffs into the lungs every 4 (four) hours as needed for wheezing or shortness of breath.   allopurinol (ZYLOPRIM) 100 MG tablet Take 50 mg by mouth daily. Idiopathic gout   aspirin 81 MG chewable tablet Chew 81 mg by mouth daily.   bisacodyl (DULCOLAX) 10 MG suppository Place 10 mg rectally daily as needed (constipation not relieved by MOM).   Calcium Carb-Cholecalciferol (OYSTER SHELL CALCIUM W/D) 500-200 MG-UNIT TABS Take 1 tablet by mouth 2 (two) times daily.   cefTRIAXone (ROCEPHIN) 1 g injection Inject 1 g into the muscle daily. FOR 7 DAYS   clonazePAM (KLONOPIN) 0.5 MG tablet Take 0.5 mg by mouth.   colchicine 0.6 MG tablet Take 1 tablet (0.6 mg total) by mouth daily.   colestipol (COLESTID) 1 g tablet TAKE 2 TABLETS DAILY FOR DIARRHEA. DO NOT TAKE WITHIN 2 HOURS OF OTHER ORAL MEDICATIONS. HOLD FOR CONSTIPATION.   diltiazem (CARDIZEM CD) 180 MG 24 hr capsule Take 180 mg by mouth daily.   divalproex (DEPAKOTE ER) 500 MG 24 hr tablet Take 1,500 mg by mouth at bedtime. FOR MOOD/BEHAVIORS   enoxaparin  (LOVENOX) 40 MG/0.4ML injection Inject 40 mg into the skin daily.   feeding supplement, GLUCERNA SHAKE, (GLUCERNA SHAKE) LIQD Take 237 mLs by mouth daily.   furosemide (LASIX) 20 MG tablet Take 1 tablet (20 mg total) by mouth every other day.   insulin aspart (NOVOLOG) 100 UNIT/ML injection Inject 5 Units into the skin 3 (three) times daily. For CBG >200   insulin detemir (LEVEMIR) 100 UNIT/ML injection Inject 0.12 mLs (12 Units total) into the skin 2 (two) times daily.   levothyroxine (SYNTHROID) 150 MCG tablet Take 150 mcg by mouth daily before breakfast.   magnesium hydroxide (MILK OF MAGNESIA) 400 MG/5ML suspension Take 30 mLs by mouth daily as needed (if no BM in 3 days).   mirtazapine (REMERON) 15 MG tablet Take 15 mg by mouth at bedtime.   nicotine (NICODERM CQ - DOSED IN MG/24 HOURS) 14 mg/24hr patch Place 14 mg onto the skin daily. For 6 weeks, along with 7MG /24 HR PATCH APPLY 1 PATCH TRANSDERMALLY ONCE DAILY FOR (14) DAYS FOR SMOKNIG CESSATION (starting on 01/16/2022)  Nutritional Supplements (NUTRITIONAL SUPPLEMENT PO) Take 1 each by mouth daily. Magic Cup   ondansetron (ZOFRAN) 4 MG tablet Take 1 tablet (4 mg total) by mouth every 8 (eight) hours as needed for nausea or vomiting.   pantoprazole (PROTONIX) 40 MG tablet TAKE 1 TABLET BY MOUTH ONCE DAILY 30 MINTUES BEFORE BREAKFAST.   PARoxetine (PAXIL) 40 MG tablet Take 1 tablet (40 mg total) by mouth daily.   QUETIAPINE FUMARATE ER PO Take 200 mg by mouth at bedtime.   rosuvastatin (CRESTOR) 40 MG tablet Take 1 tablet (40 mg total) by mouth daily.   senna-docusate (SENOKOT-S) 8.6-50 MG tablet Take 1 tablet by mouth at bedtime as needed for mild constipation.   Sodium Phosphates (RA SALINE ENEMA RE) Place 1 Dose rectally once as needed (severe constipation). If no relief from both milk of magnesia, and Bisacodyl suppository, administer one dose of disposable saline enema.   vitamin B-12 (CYANOCOBALAMIN) 500 MCG tablet Take 500 mcg by mouth  daily.   [DISCONTINUED] metoprolol tartrate (LOPRESSOR) 25 MG tablet Take 12.5 mg by mouth once. For elevated heart rate (Patient not taking: Reported on 01/12/2022)   [DISCONTINUED] NON FORMULARY Diet:Mech soft/thin diet consistency   No facility-administered encounter medications on file as of 01/13/2022.    Review of Systems  Constitutional:  Negative for appetite change, chills, fatigue and fever.  HENT:  Negative for congestion, hearing loss, rhinorrhea and sore throat.   Eyes: Negative.   Respiratory:  Negative for cough, shortness of breath and wheezing.   Cardiovascular:  Negative for chest pain, palpitations and leg swelling.  Gastrointestinal:  Negative for abdominal pain, constipation, diarrhea, nausea and vomiting.  Genitourinary:  Negative for dysuria.  Musculoskeletal:  Negative for arthralgias, back pain and myalgias.  Skin:  Negative for color change, rash and wound.  Neurological:  Negative for dizziness, weakness and headaches.  Psychiatric/Behavioral:  Negative for behavioral problems. The patient is not nervous/anxious.       Immunization History  Administered Date(s) Administered   Fluad Quad(high Dose 65+) 07/09/2021   Moderna Sars-Covid-2 Vaccination 11/16/2019, 12/10/2019   Tdap 07/09/2021   Zoster Recombinat (Shingrix) 07/09/2021   Pertinent  Health Maintenance Due  Topic Date Due   OPHTHALMOLOGY EXAM  Never done   HEMOGLOBIN A1C  05/06/2022   FOOT EXAM  08/04/2022   URINE MICROALBUMIN  11/20/2022   MAMMOGRAM  10/15/2023   COLONOSCOPY (Pts 45-45yrs Insurance coverage will need to be confirmed)  02/11/2025   INFLUENZA VACCINE  Completed   DEXA SCAN  Completed   Fall Risk 11/09/2021 11/10/2021 11/28/2021 12/02/2021 12/31/2021  Falls in the past year? - - - - 1  Number of falls in past year - - - - -  Was there an injury with Fall? - - - - 0  Fall Risk Category Calculator - - - - 2  Fall Risk Category - - - - Moderate  Patient Fall Risk Level High fall risk  High fall risk High fall risk High fall risk Moderate fall risk  Patient at Risk for Falls Due to - - - - History of fall(s);Impaired mobility  Fall risk Follow up - - - - Education provided;Falls prevention discussed     Vitals:   01/13/22 1442  BP: (!) 144/80  Pulse: 81  Resp: 18  Temp: (!) 97.5 F (36.4 C)  Weight: 122 lb 6.4 oz (55.5 kg)  Height: 5\' 4"  (1.626 m)   Body mass index is 21.01 kg/m.  Physical Exam Constitutional:  Appearance: Normal appearance.  HENT:     Head: Normocephalic and atraumatic.     Nose: Nose normal.     Mouth/Throat:     Mouth: Mucous membranes are moist.  Eyes:     Conjunctiva/sclera: Conjunctivae normal.  Cardiovascular:     Rate and Rhythm: Normal rate and regular rhythm.  Pulmonary:     Effort: Pulmonary effort is normal.     Breath sounds: Normal breath sounds.  Abdominal:     General: Bowel sounds are normal.     Palpations: Abdomen is soft.  Musculoskeletal:     Cervical back: Normal range of motion.  Skin:    General: Skin is warm and dry.  Neurological:     Mental Status: She is alert. Mental status is at baseline. She is disoriented.     Comments: Left-sided weakness  Psychiatric:        Mood and Affect: Mood normal.        Behavior: Behavior normal.       Labs reviewed: Recent Labs    11/05/21 0425 11/06/21 0319 12/02/21 1208 12/14/21 0000 12/29/21 0000 01/12/22 0000  NA 133* 136 134* 143 137 142  K 4.6 4.1 4.4 4.7 4.1 4.3  CL 96* 97* 101 105 97* 101  CO2 26 31 26  29* 33* 28*  GLUCOSE 210* 172* 229*  --   --   --   BUN 54* 44* 18 16 23* 22*  CREATININE 1.05* 0.99 0.83 0.6 0.7 0.7  CALCIUM 9.1 10.3 9.5 9.6 9.5 10.2  MG  --  1.7  --   --   --   --   PHOS  --  2.6  --   --   --   --    Recent Labs    11/04/21 1959 11/05/21 0425 12/02/21 1208 12/29/21 0000  AST 42* 44* 16 12*  ALT 25 27 12 11   ALKPHOS 70 61 59 120  BILITOT 0.7 1.1 0.5  --   PROT 6.2* 5.5* 5.8*  --   ALBUMIN 2.4* 2.2* 2.6* 2.8*    Recent Labs    11/04/21 1959 11/04/21 2006 11/05/21 0425 11/06/21 0319 12/02/21 1208 12/14/21 0000 12/29/21 0000 01/12/22 0000  WBC 7.5  --  7.6 7.1 7.4 4.6 4.3 11.5  NEUTROABS 5.0  --   --  3.6 5.3  --   --   --   HGB 12.6   < > 10.7* 11.3* 12.3 10.6* 10.9* 11.0*  HCT 38.1   < > 32.3* 33.3* 37.4 34* 33* 36  MCV 95.3  --  96.1 92.8 99.5  --   --   --   PLT 290  --  272 264 169 360 262 225   < > = values in this interval not displayed.   Lab Results  Component Value Date   TSH 0.44 01/12/2022   Lab Results  Component Value Date   HGBA1C 7.8 (H) 11/05/2021   Lab Results  Component Value Date   CHOL 275 (H) 09/03/2021   HDL 39 (L) 09/03/2021   LDLCALC 202 (H) 09/03/2021   TRIG 169 (H) 09/03/2021   CHOLHDL 7.1 09/03/2021    Significant Diagnostic Results in last 30 days:  No results found.  Assessment/Plan  1. PAF (paroxysmal atrial fibrillation) (HCC) -   Rate controlled, continue diltiazem for rate control -   Eliquis was not restarted due to history of multiple falls -  will discontinue Lovenox due to multiple falls, risks outweigh the  benefits  2. Bipolar disorder without psychotic features (HCC) -   stable, continue Seroquel and divalproex sodium ER -    Follow-up with psych  3. PTSD (post-traumatic stress disorder) -    stable,continue paroxetine  4. Anxiety -   has occasional episodes of anxiety attacks and continues to need PRN Clonazepam  5. Poor appetite Wt Readings from Last 3 Encounters:  01/13/22 122 lb 6.4 oz (55.5 kg)  01/12/22 149 lb 3.2 oz (67.7 kg)  01/12/22 122 lb 6.4 oz (55.5 kg)   -   continue Remeron and supplementation with Glucerna 237 mL daily     Family/ staff Communication: Discussed plan of care with resident and charge nurse  Labs/tests ordered:   None    Kenard Gower, DNP, MSN, FNP-BC [facility] [phone] (Monday-Friday 8:00 a.m. - 5:00 p.m.) [phone] (after hours)

## 2022-01-23 LAB — HEPATIC FUNCTION PANEL
ALT: 9 U/L (ref 7–35)
AST: 10 — AB (ref 13–35)
Alkaline Phosphatase: 77 (ref 25–125)

## 2022-01-23 LAB — LIPID PANEL
Cholesterol: 147 (ref 0–200)
HDL: 40 (ref 35–70)
LDL Cholesterol: 87
Triglycerides: 100 (ref 40–160)

## 2022-01-26 ENCOUNTER — Other Ambulatory Visit (HOSPITAL_COMMUNITY): Payer: Medicare Other

## 2022-01-27 ENCOUNTER — Other Ambulatory Visit: Payer: Self-pay

## 2022-01-27 ENCOUNTER — Ambulatory Visit (HOSPITAL_COMMUNITY)
Admission: RE | Admit: 2022-01-27 | Discharge: 2022-01-27 | Disposition: A | Discharge status: Home / Self Care | Payer: PRIVATE HEALTH INSURANCE | Source: Ambulatory Visit | Attending: Physician Assistant | Admitting: Physician Assistant

## 2022-01-27 DIAGNOSIS — E785 Hyperlipidemia, unspecified: Secondary | ICD-10-CM | POA: Insufficient documentation

## 2022-01-27 DIAGNOSIS — E119 Type 2 diabetes mellitus without complications: Secondary | ICD-10-CM | POA: Diagnosis not present

## 2022-01-27 DIAGNOSIS — J449 Chronic obstructive pulmonary disease, unspecified: Secondary | ICD-10-CM | POA: Insufficient documentation

## 2022-01-27 DIAGNOSIS — I3139 Other pericardial effusion (noninflammatory): Secondary | ICD-10-CM | POA: Insufficient documentation

## 2022-01-27 LAB — ECHOCARDIOGRAM LIMITED: Area-P 1/2: 4.17 cm2

## 2022-01-28 LAB — TSH: TSH: 0.88 (ref 0.41–5.90)

## 2022-02-08 ENCOUNTER — Ambulatory Visit: Payer: Self-pay | Admitting: Neurology

## 2022-02-08 LAB — BASIC METABOLIC PANEL
BUN: 22 — AB (ref 4–21)
CO2: 27 — AB (ref 13–22)
Chloride: 102 (ref 99–108)
Creatinine: 0.5 (ref 0.5–1.1)
Glucose: 83
Potassium: 4.7 mEq/L (ref 3.5–5.1)
Sodium: 139 (ref 137–147)

## 2022-02-08 LAB — COMPREHENSIVE METABOLIC PANEL WITH GFR
Albumin: 3.3 — AB (ref 3.5–5.0)
Calcium: 9.9 (ref 8.7–10.7)
Globulin: 1.9

## 2022-02-08 LAB — HEPATIC FUNCTION PANEL
ALT: 11 U/L (ref 7–35)
AST: 16 (ref 13–35)
Alkaline Phosphatase: 70 (ref 25–125)
Bilirubin, Total: 0.2

## 2022-02-16 ENCOUNTER — Encounter: Payer: Self-pay | Admitting: Adult Health

## 2022-02-16 NOTE — Progress Notes (Deleted)
? ?Location:  [facility] Room Number: 217-A ?Place of Service:  SNF (31) ?Provider:  Durenda Age, DNP, FNP-BC ? ?Patient Care Team: ?Hendricks Limes, MD as PCP - General (Internal Medicine) ?Freada Bergeron, MD as PCP - Cardiology (Cardiology) ?Carrolyn Meiers, MD (Internal Medicine) ?Satira Sark, MD (Cardiology) ?Rourk, Cristopher Estimable, MD (Gastroenterology) ?Medina-Vargas, Senaida Lange, NP as Nurse Practitioner (Internal Medicine) ?Medina-Vargas, Senaida Lange, NP as Nurse Practitioner (Internal Medicine) ? ?Extended Emergency Contact Information ?Primary Emergency Contact: Yow,Johnny ?Address: [address] ?         [place], [place] [address] [place] ?Home Phone: [phone] ?Relation: Son ?Secondary Emergency Contact: Yow,Della ?Mobile Phone: [phone] ?Relation: Other ? ?Code Status:  Full Code  ? ?Goals of care: Advanced Directive information ? ?  02/16/2022  ? 10:04 AM  ?Advanced Directives  ?Does Patient Have a Medical Advance Directive? Yes  ?Type of Advance Directive Healthcare Power of Attorney  ?Does patient want to make changes to medical advance directive? No - Patient declined  ?Copy of Sprague in Chart? Yes - validated most recent copy scanned in chart (See row information)  ? ? ? ?Chief Complaint  ?Patient presents with  ? Advanced Directive  ?  Advance care planning   ? ? ?HPI:  ?Pt is a 71 y.o. female seen today for medical management of chronic diseases.  *** ? ? ?Past Medical History:  ?Diagnosis Date  ? Arthritis   ? COPD (chronic obstructive pulmonary disease) ([place])   ? Diverticulosis   ? Essential hypertension, benign   ? Hx of colonic polyps   ? Hypothyroidism   ? IBS (irritable bowel syndrome)   ? Ketoacidosis   ? Mixed hyperlipidemia   ? PTSD (post-traumatic stress disorder)   ? S/P colonoscopy 11/2009  ? RMR: pancolonic diverticula, polypoid rectal mucosa with  prominent lymphoid aggregates, repeat in Jan 2016   ?  Stress incontinence, female   ? Tobacco use   ? Type 2 diabetes mellitus ([place])   ? ?Past Surgical History:  ?Procedure Laterality Date  ? Bilateral tubal ligation    ? BREAST BIOPSY Left 10/16/2014  ? negative  ? CHOLECYSTECTOMY  09/11/2012  ? Procedure: LAPAROSCOPIC CHOLECYSTECTOMY;  Surgeon: Jamesetta So, MD;  Location: [facility];  Service: General;  Laterality: N/A;  ? COLONOSCOPY  11/17/2009  ? Dr. Gala Romney: normal rectum, pancolonic diverticula, polyps benign, no adenomas.   ? COLONOSCOPY N/A 02/12/2015  ? Dr. Gala Romney: colonic diverticulosis, multiple colonic polyps (tubular adenomas), negative segmental biopsies. Surveillance in 2021  ? ESOPHAGOGASTRODUODENOSCOPY  11/23/2011  ? Dr. Gala Romney: Erosive reflux esophagitis; small hiatal hernia; esophagus dilated empirically  ? MALONEY DILATION  11/23/2011  ? Procedure: MALONEY DILATION;  Surgeon: Daneil Dolin, MD;  [facility];  Service: Endoscopy;  Laterality: N/A;  ? PERICARDIOCENTESIS N/A 11/06/2021  ? Procedure: PERICARDIOCENTESIS;  Surgeon: Jettie Booze, MD;  Location: [place] CV [facility];  Service: Cardiovascular;  Laterality: N/A;  ? SKIN LESION EXCISION    ? on buttocks  ? TUBAL LIGATION    ? ? ?No Known Allergies ? ?Outpatient Encounter Medications as of 02/16/2022  ?Medication Sig  ? albuterol (VENTOLIN HFA) 108 (90 Base) MCG/ACT inhaler Inhale 2 puffs into the lungs every 4 (four) hours as needed for wheezing or shortness of breath.  ? allopurinol (ZYLOPRIM) 100 MG tablet Take 50 mg by mouth daily. Idiopathic gout  ? aspirin 81 MG chewable tablet Chew 81  mg by mouth daily.  ? bisacodyl (DULCOLAX) 10 MG suppository Place 10 mg rectally daily as needed (constipation not relieved by MOM).  ? Calcium Carb-Cholecalciferol (OYSTER SHELL CALCIUM W/D) 500-200 MG-UNIT TABS Take 1 tablet by mouth 2 (two) times daily.  ? clonazePAM (KLONOPIN) 0.5 MG tablet Take 0.5 mg by mouth.  ? colchicine 0.6 MG tablet Take 1 tablet (0.6 mg total) by mouth daily.  ?  colestipol (COLESTID) 1 g tablet TAKE 2 TABLETS DAILY FOR DIARRHEA. DO NOT TAKE WITHIN 2 HOURS OF OTHER ORAL MEDICATIONS. HOLD FOR CONSTIPATION.  ? diltiazem (CARDIZEM CD) 180 MG 24 hr capsule Take 180 mg by mouth daily.  ? divalproex (DEPAKOTE ER) 500 MG 24 hr tablet Take 1,500 mg by mouth at bedtime. FOR MOOD/BEHAVIORS  ? feeding supplement, GLUCERNA SHAKE, (GLUCERNA SHAKE) LIQD Take 237 mLs by mouth daily.  ? furosemide (LASIX) 20 MG tablet Take 1 tablet (20 mg total) by mouth every other day.  ? insulin aspart (NOVOLOG) 100 UNIT/ML injection Inject 5 Units into the skin 3 (three) times daily. For CBG >200  ? insulin detemir (LEVEMIR) 100 UNIT/ML injection Inject 0.12 mLs (12 Units total) into the skin 2 (two) times daily.  ? levothyroxine (SYNTHROID) 150 MCG tablet Take 150 mcg by mouth daily before breakfast.  ? magnesium hydroxide (MILK OF MAGNESIA) 400 MG/5ML suspension Take 30 mLs by mouth daily as needed (if no BM in 3 days).  ? mirtazapine (REMERON) 15 MG tablet Take 15 mg by mouth at bedtime.  ? nicotine (NICODERM CQ - DOSED IN MG/24 HR) 7 mg/24hr patch Place 7 mg onto the skin daily. X 1 month  ? Nutritional Supplements (NUTRITIONAL SUPPLEMENT PO) Take 1 each by mouth daily. Magic Cup  ? ondansetron (ZOFRAN) 4 MG tablet Take 1 tablet (4 mg total) by mouth every 8 (eight) hours as needed for nausea or vomiting.  ? pantoprazole (PROTONIX) 40 MG tablet TAKE 1 TABLET BY MOUTH ONCE DAILY 30 MINTUES BEFORE BREAKFAST.  ? PARoxetine (PAXIL) 40 MG tablet Take 1 tablet (40 mg total) by mouth daily.  ? QUETIAPINE FUMARATE ER PO Take 200 mg by mouth at bedtime.  ? rosuvastatin (CRESTOR) 40 MG tablet Take 1 tablet (40 mg total) by mouth daily.  ? senna-docusate (SENOKOT-S) 8.6-50 MG tablet Take 1 tablet by mouth at bedtime as needed for mild constipation.  ? Sodium Phosphates (RA SALINE ENEMA RE) Place 1 Dose rectally once as needed (severe constipation). If no relief from both milk of magnesia, and Bisacodyl  suppository, administer one dose of disposable saline enema.  ? vitamin B-12 (CYANOCOBALAMIN) 500 MCG tablet Take 500 mcg by mouth daily.  ? [DISCONTINUED] acetaminophen (TYLENOL) 325 MG tablet Take 650 mg by mouth every 6 (six) hours as needed.  ? [DISCONTINUED] cefTRIAXone (ROCEPHIN) 1 g injection Inject 1 g into the muscle daily. FOR 7 DAYS  ? [DISCONTINUED] enoxaparin (LOVENOX) 40 MG/0.4ML injection Inject 40 mg into the skin daily.  ? ?No facility-administered encounter medications on file as of 02/16/2022.  ? ? ?Review of Systems *** ? ? ? ?Immunization History  ?Administered Date(s) Administered  ? Fluad Quad(high Dose 65+) 07/09/2021  ? Moderna Sars-Covid-2 Vaccination 11/16/2019, 12/10/2019  ? Tdap 07/09/2021  ? Zoster Recombinat (Shingrix) 07/09/2021  ? ?Pertinent  Health Maintenance Due  ?Topic Date Due  ? OPHTHALMOLOGY EXAM  Never done  ? HEMOGLOBIN A1C  05/06/2022  ? INFLUENZA VACCINE  06/08/2022  ? FOOT EXAM  08/04/2022  ? URINE MICROALBUMIN  11/20/2022  ?  MAMMOGRAM  10/15/2023  ? COLONOSCOPY (Pts 45-90yr Insurance coverage will need to be confirmed)  02/11/2025  ? DEXA SCAN  Completed  ? ? ?  11/09/2021  ?  8:25 AM 11/10/2021  ?  8:40 AM 11/28/2021  ?  7:48 PM 12/02/2021  ? 11:30 AM 12/31/2021  ? 12:42 PM  ?Fall Risk  ?Falls in the past year?     1  ?Was there an injury with Fall?     0  ?Fall Risk Category Calculator     2  ?Fall Risk Category     Moderate  ?Patient Fall Risk Level High fall risk High fall risk High fall risk High fall risk Moderate fall risk  ?Patient at Risk for Falls Due to     History of fall(s);Impaired mobility  ?Fall risk Follow up     Education provided;Falls prevention discussed  ? ? ? ?Vitals:  ? 02/16/22 0951  ?BP: 136/68  ?Pulse: 100  ?Resp: 18  ?Temp: (!) 97.2 ?F (36.2 ?C)  ?Weight: 121 lb 9.6 oz (55.2 kg)  ?Height: '5\' 4"'$  (1.626 m)  ? ?Body mass index is 20.87 kg/m?. ? ?Physical Exam  ? ? ? ?Labs reviewed: ?Recent Labs  ?  11/05/21 ?0425 11/06/21 ?0379001/25/23 ?1208  12/14/21 ?0000 12/29/21 ?0000 01/12/22 ?0000 02/08/22 ?0000  ?NA 133* 136 134*   < > 137 142 139  ?K 4.6 4.1 4.4   < > 4.1 4.3 4.7  ?CL 96* 97* 101   < > 97* 101 102  ?CO2 '26 31 26   '$ < > 33* 28* 27*  ?GLUCOSE 210* 172* 229*  --

## 2022-02-16 NOTE — Progress Notes (Signed)
Opened in error  This encounter was created in error - please disregard. 

## 2022-02-17 ENCOUNTER — Non-Acute Institutional Stay (SKILLED_NURSING_FACILITY): Payer: Medicare Other | Admitting: Adult Health

## 2022-02-17 ENCOUNTER — Encounter: Payer: Self-pay | Admitting: Adult Health

## 2022-02-17 DIAGNOSIS — E1122 Type 2 diabetes mellitus with diabetic chronic kidney disease: Secondary | ICD-10-CM | POA: Diagnosis not present

## 2022-02-17 DIAGNOSIS — I480 Paroxysmal atrial fibrillation: Secondary | ICD-10-CM | POA: Diagnosis not present

## 2022-02-17 DIAGNOSIS — Z7189 Other specified counseling: Secondary | ICD-10-CM | POA: Diagnosis not present

## 2022-02-17 DIAGNOSIS — F319 Bipolar disorder, unspecified: Secondary | ICD-10-CM

## 2022-02-17 DIAGNOSIS — N1831 Chronic kidney disease, stage 3a: Secondary | ICD-10-CM

## 2022-02-17 DIAGNOSIS — Z794 Long term (current) use of insulin: Secondary | ICD-10-CM

## 2022-02-17 DIAGNOSIS — E039 Hypothyroidism, unspecified: Secondary | ICD-10-CM

## 2022-02-17 NOTE — Progress Notes (Signed)
? ?Location:  [facility] ?Nursing Home Room Number: 217-A ?Place of Service:  SNF (31) ?Provider:  Durenda Age, DNP, FNP-BC ? ?Patient Care Team: ?Hendricks Limes, MD as PCP - General (Internal Medicine) ?Freada Bergeron, MD as PCP - Cardiology (Cardiology) ?Carrolyn Meiers, MD (Internal Medicine) ?Satira Sark, MD (Cardiology) ?Rourk, Cristopher Estimable, MD (Gastroenterology) ?Medina-Vargas, Senaida Lange, NP as Nurse Practitioner (Internal Medicine) ?Medina-Vargas, Senaida Lange, NP as Nurse Practitioner (Internal Medicine) ? ?Extended Emergency Contact Information ?Primary Emergency Contact: Yow,Johnny ?Address: [address] ?         [place], [place] [address] [place] ?Home Phone: [phone] ?Relation: Son ?Secondary Emergency Contact: Yow,Della ?Mobile Phone: [phone] ?Relation: Other ? ?Code Status:  Full Code  ? ?Goals of care: Advanced Directive information ? ?  02/17/2022  ? 10:30 AM  ?Advanced Directives  ?Does Patient Have a Medical Advance Directive? Yes  ?Type of Advance Directive Healthcare Power of Attorney  ?Does patient want to make changes to medical advance directive? No - Patient declined  ?Copy of [place] in Chart? Yes - validated most recent copy scanned in chart (See row information)  ? ? ? ?Chief Complaint  ?Patient presents with  ? Advanced Directive  ?  Care plan meeting   ? ? ?HPI:  ?Pt is a 71 y.o. [place] seen today for care plan meeting attended by resident, NP, activity director, treatment nurse, administrator, Raquel Sarna (daughter)  and Charlotte Crumb (Son) who attended via teleconference.  She remains to be full code.  Discussed medications, vital signs and weights.  No open wound per treatment nurse.  DM score is 7/15, ranging in severe cognitive impairment.  Resident likes to do crossword puzzles and and enjoys cooking when he was at home.  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 The meeting lasted for 20 minutes. ?     ? ?Past Medical History:  ?Diagnosis Date  ? Arthritis   ? COPD (chronic obstructive pulmonary disease) ([place])   ? Diverticulosis   ? Essential hypertension, benign   ? Hx of colonic polyps   ? Hypothyroidism   ? IBS (irritable bowel syndrome)   ? Ketoacidosis   ? Mixed hyperlipidemia   ? PTSD (post-traumatic stress disorder)   ? S/P colonoscopy 11/2009  ? RMR: pancolonic diverticula, polypoid rectal mucosa with  prominent lymphoid aggregates, repeat in Jan 2016   ? Stress incontinence, [place]   ? Tobacco use   ? Type 2 diabetes mellitus ([place])   ? ?Past Surgical History:  ?Procedure Laterality Date  ? Bilateral tubal ligation    ? BREAST BIOPSY Left 10/16/2014  ? negative  ? CHOLECYSTECTOMY  09/11/2012  ? Procedure: LAPAROSCOPIC CHOLECYSTECTOMY;  Surgeon: Jamesetta So, MD;  Location: [facility];  Service: General;  Laterality: N/A;  ? COLONOSCOPY  11/17/2009  ? Dr. Gala Romney: normal rectum, pancolonic diverticula, polyps benign, no adenomas.   ? COLONOSCOPY N/A 02/12/2015  ? Dr. Gala Romney: colonic diverticulosis, multiple colonic polyps (tubular adenomas), negative segmental biopsies. Surveillance in 2021  ? ESOPHAGOGASTRODUODENOSCOPY  11/23/2011  ? Dr. Gala Romney: Erosive reflux esophagitis; small hiatal hernia; esophagus dilated empirically  ? MALONEY DILATION  11/23/2011  ? Procedure: MALONEY DILATION;  Surgeon: Daneil Dolin, MD;  Location: [facility];  Service: Endoscopy;  Laterality: N/A;  ? PERICARDIOCENTESIS N/A 11/06/2021  ? Procedure: PERICARDIOCENTESIS;  Surgeon: Jettie Booze, MD;  Location: [facility];  Service: Cardiovascular;  Laterality: N/A;  ?  SKIN LESION EXCISION    ? on buttocks  ? TUBAL LIGATION    ? ? ?No Known Allergies ? ?Outpatient Encounter Medications as of 02/17/2022  ?Medication Sig  ? albuterol (VENTOLIN HFA) 108 (90 Base) MCG/ACT inhaler Inhale 2 puffs into the lungs every 4 (four) hours as needed for wheezing or shortness of breath.  ? allopurinol (ZYLOPRIM) 100 MG tablet Take 50 mg by  mouth daily. Idiopathic gout  ? aspirin 81 MG chewable tablet Chew 81 mg by mouth daily.  ? bisacodyl (DULCOLAX) 10 MG suppository Place 10 mg rectally daily as needed (constipation not relieved by MOM).  ? Calcium Carb-Cholecalciferol (OYSTER SHELL CALCIUM W/D) 500-200 MG-UNIT TABS Take 1 tablet by mouth 2 (two) times daily.  ? clonazePAM (KLONOPIN) 0.5 MG tablet Take 0.5 mg by mouth.  ? colchicine 0.6 MG tablet Take 1 tablet (0.6 mg total) by mouth daily.  ? colestipol (COLESTID) 1 g tablet TAKE 2 TABLETS DAILY FOR DIARRHEA. DO NOT TAKE WITHIN 2 HOURS OF OTHER ORAL MEDICATIONS. HOLD FOR CONSTIPATION.  ? diltiazem (CARDIZEM CD) 180 MG 24 hr capsule Take 180 mg by mouth daily.  ? divalproex (DEPAKOTE ER) 500 MG 24 hr tablet Take 1,500 mg by mouth at bedtime. FOR MOOD/BEHAVIORS  ? feeding supplement, GLUCERNA SHAKE, (GLUCERNA SHAKE) LIQD Take 237 mLs by mouth daily.  ? furosemide (LASIX) 20 MG tablet Take 1 tablet (20 mg total) by mouth every other day.  ? insulin aspart (NOVOLOG) 100 UNIT/ML injection Inject 5 Units into the skin 3 (three) times daily. For CBG >200  ? insulin detemir (LEVEMIR) 100 UNIT/ML injection Inject 0.12 mLs (12 Units total) into the skin 2 (two) times daily.  ? levothyroxine (SYNTHROID) 150 MCG tablet Take 150 mcg by mouth daily before breakfast.  ? magnesium hydroxide (MILK OF MAGNESIA) 400 MG/5ML suspension Take 30 mLs by mouth daily as needed (if no BM in 3 days).  ? mirtazapine (REMERON) 15 MG tablet Take 15 mg by mouth at bedtime.  ? nicotine (NICODERM CQ - DOSED IN MG/24 HR) 7 mg/24hr patch Place 7 mg onto the skin daily. X 1 month  ? Nutritional Supplements (NUTRITIONAL SUPPLEMENT PO) Take 1 each by mouth daily. Magic Cup  ? ondansetron (ZOFRAN) 4 MG tablet Take 1 tablet (4 mg total) by mouth every 8 (eight) hours as needed for nausea or vomiting.  ? pantoprazole (PROTONIX) 40 MG tablet TAKE 1 TABLET BY MOUTH ONCE DAILY 30 MINTUES BEFORE BREAKFAST.  ? PARoxetine (PAXIL) 40 MG tablet  Take 1 tablet (40 mg total) by mouth daily.  ? QUETIAPINE FUMARATE ER PO Take 200 mg by mouth at bedtime.  ? rosuvastatin (CRESTOR) 40 MG tablet Take 1 tablet (40 mg total) by mouth daily.  ? senna-docusate (SENOKOT-S) 8.6-50 MG tablet Take 1 tablet by mouth at bedtime as needed for mild constipation.  ? Sodium Phosphates (RA SALINE ENEMA RE) Place 1 Dose rectally once as needed (severe constipation). If no relief from both milk of magnesia, and Bisacodyl suppository, administer one dose of disposable saline enema.  ? vitamin B-12 (CYANOCOBALAMIN) 500 MCG tablet Take 500 mcg by mouth daily.  ? ?No facility-administered encounter medications on file as of 02/17/2022.  ? ? ?Review of Systems  ?Constitutional:  Negative for appetite change, chills, fatigue and fever.  ?HENT:  Negative for congestion, hearing loss, rhinorrhea and sore throat.   ?Eyes: Negative.   ?Respiratory:  Negative for cough, shortness of breath and wheezing.   ?Cardiovascular:  Negative for  chest pain, palpitations and leg swelling.  ?Gastrointestinal:  Negative for abdominal pain, constipation, diarrhea, nausea and vomiting.  ?Genitourinary:  Negative for dysuria.  ?Musculoskeletal:  Negative for arthralgias, back pain and myalgias.  ?Skin:  Negative for color change, rash and wound.  ?Neurological:  Negative for dizziness, weakness and headaches.  ?Psychiatric/Behavioral:  Negative for behavioral problems. The patient is not nervous/anxious.    ? ? ? ?Immunization History  ?Administered Date(s) Administered  ? Fluad Quad(high Dose 65+) 07/09/2021  ? Moderna Sars-Covid-2 Vaccination 11/16/2019, 12/10/2019  ? Tdap 07/09/2021  ? Zoster Recombinat (Shingrix) 07/09/2021  ? ?Pertinent  Health Maintenance Due  ?Topic Date Due  ? OPHTHALMOLOGY EXAM  Never done  ? HEMOGLOBIN A1C  05/06/2022  ? INFLUENZA VACCINE  06/08/2022  ? FOOT EXAM  08/04/2022  ? URINE MICROALBUMIN  11/20/2022  ? MAMMOGRAM  10/15/2023  ? COLONOSCOPY (Pts 45-20yr Insurance coverage  will need to be confirmed)  02/11/2025  ? DEXA SCAN  Completed  ? ? ?  11/09/2021  ?  8:25 AM 11/10/2021  ?  8:40 AM 11/28/2021  ?  7:48 PM 12/02/2021  ? 11:30 AM 12/31/2021  ? 12:42 PM  ?Fall Risk  ?Falls in the

## 2022-02-19 LAB — CBC AND DIFFERENTIAL
HCT: 32 — AB (ref 36–46)
Hemoglobin: 10.7 — AB (ref 12.0–16.0)
Neutrophils Absolute: 1.4
Platelets: 177 10*3/uL (ref 150–400)

## 2022-02-19 LAB — CBC: RBC: 3.43 — AB (ref 3.87–5.11)

## 2022-02-22 ENCOUNTER — Encounter: Payer: Self-pay | Admitting: Neurology

## 2022-02-22 ENCOUNTER — Ambulatory Visit (INDEPENDENT_AMBULATORY_CARE_PROVIDER_SITE_OTHER): Payer: Medicare Other | Admitting: Neurology

## 2022-02-22 ENCOUNTER — Non-Acute Institutional Stay (SKILLED_NURSING_FACILITY): Payer: Medicare Other | Admitting: Adult Health

## 2022-02-22 ENCOUNTER — Encounter: Payer: Self-pay | Admitting: Adult Health

## 2022-02-22 VITALS — BP 142/69 | HR 91 | Ht 64.0 in

## 2022-02-22 DIAGNOSIS — F0150 Vascular dementia without behavioral disturbance: Secondary | ICD-10-CM

## 2022-02-22 DIAGNOSIS — Z9181 History of falling: Secondary | ICD-10-CM

## 2022-02-22 DIAGNOSIS — R296 Repeated falls: Secondary | ICD-10-CM

## 2022-02-22 DIAGNOSIS — E559 Vitamin D deficiency, unspecified: Secondary | ICD-10-CM | POA: Insufficient documentation

## 2022-02-22 DIAGNOSIS — F03911 Unspecified dementia, unspecified severity, with agitation: Secondary | ICD-10-CM | POA: Diagnosis not present

## 2022-02-22 DIAGNOSIS — N390 Urinary tract infection, site not specified: Secondary | ICD-10-CM

## 2022-02-22 DIAGNOSIS — F319 Bipolar disorder, unspecified: Secondary | ICD-10-CM | POA: Diagnosis not present

## 2022-02-22 DIAGNOSIS — G309 Alzheimer's disease, unspecified: Secondary | ICD-10-CM | POA: Diagnosis not present

## 2022-02-22 DIAGNOSIS — I480 Paroxysmal atrial fibrillation: Secondary | ICD-10-CM | POA: Diagnosis not present

## 2022-02-22 DIAGNOSIS — Z8673 Personal history of transient ischemic attack (TIA), and cerebral infarction without residual deficits: Secondary | ICD-10-CM

## 2022-02-22 DIAGNOSIS — F0280 Dementia in other diseases classified elsewhere without behavioral disturbance: Secondary | ICD-10-CM

## 2022-02-22 LAB — CBC AND DIFFERENTIAL
HCT: 37 (ref 36–46)
Hemoglobin: 12.2 (ref 12.0–16.0)
Neutrophils Absolute: 1.4
Platelets: 178 10*3/uL (ref 150–400)
WBC: 5.2

## 2022-02-22 MED ORDER — MEMANTINE HCL 28 X 5 MG & 21 X 10 MG PO TABS: ORAL_TABLET | ORAL | 12 refills | Status: DC

## 2022-02-22 NOTE — Progress Notes (Signed)
?[facility] ?[address] ?[place]. [place] [address] ?[phone] ? ?     OFFICE CONSULT NOTE ? ?Ms. [name] ?Date of Birth:  03-14-1951 ?Medical Record Number:  [id number]  ? ?Referring MD:  Nicholes Rough, PA-C ? ?Reason for Referral: Stroke ? ?HPI: Ms Gensel is a 71 year old Caucasian lady seen today for initial office consultation visit for stroke, dementia and agitation.  History is obtained from review of electronic medical records mostly as patient is unable to give meaningful history.  She was admitted with confusion and agitation in October 2022 following in the setting  COVID illness with mild respiratory failure of.  At that time MRI scan of the brain was obtained and showed early subacute bilateral of basal ganglia right greater than left infarcts.  She was seen by neurologist Dr. [name] who felt the patient's confusion was out of proportion to infarcts, question underlying dementia.  TSH was significantly suppressed at 0.049.  He was started on replacement for this and transferred for rehabilitation to home.  He is several falls over the and was seen in the ER twice in January.  She had a history of paroxysmal A-fib noted with February 2022 and was started on Eliquis but following her multiple falls cardiology recommended switching Eliquis to aspirin.  Repeat MRI on 11/05/2021 showed a subacute left basal ganglia infarct which was new compared to the previous MRI October 2022.  Patient has continued to have decline in her overall general medical condition and he has not been able to hold herself up in the chair and had to be transported for today's visit lying flat on stretcher.  He has been started on several psych medications after consulting psychiatrist for her dementia and agitation.  She has had residual left-sided weakness following her stroke and has had difficulty with walking and poor balance as a result. ? ?  ? ?ROS:   ?14 system review of systems is positive for  agitation, fall risk, weakness, memory loss, UTI all other systems negative ? ?PMH:  ?Past Medical History:  ?Diagnosis Date  ? Arthritis   ? COPD (chronic obstructive pulmonary disease) ([place])   ? Diverticulosis   ? Essential hypertension, benign   ? Hx of colonic polyps   ? Hypothyroidism   ? IBS (irritable bowel syndrome)   ? Ketoacidosis   ? Mixed hyperlipidemia   ? PTSD (post-traumatic stress disorder)   ? S/P colonoscopy 11/2009  ? RMR: pancolonic diverticula, polypoid rectal mucosa with  prominent lymphoid aggregates, repeat in Jan 2016   ? Stress incontinence, female   ? Tobacco use   ? Type 2 diabetes mellitus ([place])   ? ? ?Social History:  ?Social History  ? ?Socioeconomic History  ? Marital status: Legally Separated  ?  Spouse name: Not on file  ? Number of children: Not on file  ? Years of education: Not on file  ? Highest education level: Not on file  ?Occupational History  ? Not on file  ?Tobacco Use  ? Smoking status: Every Day  ?  Packs/day: 2.00  ?  Years: 43.00  ?  Pack years: 86.00  ?  Types: Cigarettes  ? Smokeless tobacco: Never  ?Vaping Use  ? Vaping Use: Never used  ?Substance and Sexual Activity  ? Alcohol use: No  ?  Alcohol/week: 0.0 standard drinks  ? Drug use: No  ? Sexual activity: Not Currently  ?  Birth control/protection: Surgical, Post-menopausal  ?  Comment: tubal  ?Other  Topics Concern  ? Not on file  ?Social History Narrative  ? Not on file  ? ?Social Determinants of Health  ? ?Financial Resource Strain: Not on file  ?Food Insecurity: Not on file  ?Transportation Needs: Not on file  ?Physical Activity: Not on file  ?Stress: Not on file  ?Social Connections: Not on file  ?Intimate Partner Violence: Not on file  ? ? ?Medications:   ?Current Outpatient Medications on File Prior to Visit  ?Medication Sig Dispense Refill  ? albuterol (VENTOLIN HFA) 108 (90 Base) MCG/ACT inhaler Inhale 2 puffs into the lungs every 4 (four) hours as needed for wheezing or shortness of breath. 8 g 1  ?  allopurinol (ZYLOPRIM) 100 MG tablet Take 50 mg by mouth daily. Idiopathic gout    ? aspirin 81 MG chewable tablet Chew 81 mg by mouth daily.    ? bisacodyl (DULCOLAX) 10 MG suppository Place 10 mg rectally daily as needed (constipation not relieved by MOM).    ? Calcium Carb-Cholecalciferol (OYSTER SHELL CALCIUM W/D) 500-200 MG-UNIT TABS Take 1 tablet by mouth 2 (two) times daily.    ? clonazePAM (KLONOPIN) 0.5 MG tablet Take 0.5 mg by mouth.    ? colchicine 0.6 MG tablet Take 1 tablet (0.6 mg total) by mouth daily. 30 tablet 1  ? colestipol (COLESTID) 1 g tablet TAKE 2 TABLETS DAILY FOR DIARRHEA. DO NOT TAKE WITHIN 2 HOURS OF OTHER ORAL MEDICATIONS. HOLD FOR CONSTIPATION. 60 tablet 11  ? diltiazem (CARDIZEM CD) 180 MG 24 hr capsule Take 180 mg by mouth daily.    ? divalproex (DEPAKOTE ER) 500 MG 24 hr tablet Take 1,500 mg by mouth at bedtime. FOR MOOD/BEHAVIORS    ? feeding supplement, GLUCERNA SHAKE, (GLUCERNA SHAKE) LIQD Take 237 mLs by mouth daily.    ? furosemide (LASIX) 20 MG tablet Take 1 tablet (20 mg total) by mouth every other day. 30 tablet   ? insulin aspart (NOVOLOG) 100 UNIT/ML injection Inject 5 Units into the skin 3 (three) times daily. For CBG >200    ? insulin detemir (LEVEMIR) 100 UNIT/ML injection Inject 0.12 mLs (12 Units total) into the skin 2 (two) times daily. 10 mL 11  ? levothyroxine (SYNTHROID) 150 MCG tablet Take 150 mcg by mouth daily before breakfast.    ? magnesium hydroxide (MILK OF MAGNESIA) 400 MG/5ML suspension Take 30 mLs by mouth daily as needed (if no BM in 3 days).    ? mirtazapine (REMERON) 15 MG tablet Take 15 mg by mouth at bedtime.    ? nicotine (NICODERM CQ - DOSED IN MG/24 HR) 7 mg/24hr patch Place 7 mg onto the skin daily. X 1 month    ? Nutritional Supplements (NUTRITIONAL SUPPLEMENT PO) Take 1 each by mouth daily. Magic Cup    ? ondansetron (ZOFRAN) 4 MG tablet Take 1 tablet (4 mg total) by mouth every 8 (eight) hours as needed for nausea or vomiting. 30 tablet 2  ?  pantoprazole (PROTONIX) 40 MG tablet TAKE 1 TABLET BY MOUTH ONCE DAILY 30 MINTUES BEFORE BREAKFAST. 30 tablet 11  ? PARoxetine (PAXIL) 40 MG tablet Take 1 tablet (40 mg total) by mouth daily. 90 tablet 0  ? QUETIAPINE FUMARATE ER PO Take 200 mg by mouth at bedtime.    ? rosuvastatin (CRESTOR) 40 MG tablet Take 1 tablet (40 mg total) by mouth daily. 90 tablet 3  ? senna-docusate (SENOKOT-S) 8.6-50 MG tablet Take 1 tablet by mouth at bedtime as needed for mild constipation.    ?  Sodium Phosphates (RA SALINE ENEMA RE) Place 1 Dose rectally once as needed (severe constipation). If no relief from both milk of magnesia, and Bisacodyl suppository, administer one dose of disposable saline enema.    ? vitamin B-12 (CYANOCOBALAMIN) 500 MCG tablet Take 500 mcg by mouth daily.    ? ?No current facility-administered medications on file prior to visit.  ? ? ?Allergies:  No Known Allergies ? ?Physical Exam ?General: Frail elderly Caucasian lady, seated, in no evident distress ?Head: head normocephalic and atraumatic.   ?Neck: supple with no carotid or supraclavicular bruits ?Cardiovascular: regular rate and rhythm, no murmurs ?Musculoskeletal: no deformity ?Skin:  no rash/petichiae ?Vascular:  Normal pulses all extremities ? ?Neurologic Exam ?Mental Status: Awake and fully alert. Oriented to place but not time. Recent and remote memory poor t. Attention span, concentration and fund of knowledge diminished. Mood and affect appropriate.  Mini-Mental status exam scored 14/30.  Unable to name animals that can walk on 4 legs.  Unable to do clock drawing ?Cranial Nerves: Fundoscopic exam reveals sharp disc margins. Pupils equal, briskly reactive to light. Extraocular movements full without nystagmus. Visual fields full to confrontation. Hearing intact. Facial sensation intact. Face, tongue, palate moves normally and symmetrically.  ?Motor: Spastic left hemiplegia with 4/5 strength with grip and weakness of left grip, intrinsic hand  muscles and left hip flexors.  Increased tone and spasticity on the left sensory.: intact to touch , pinprick , position and vibratory sensation.  ?Coordination: Rapid alternating movements normal in all extremi

## 2022-02-22 NOTE — Patient Instructions (Signed)
I had a long discussion with the patient regarding her multiple strokes, mixed dementia, behavioral agitation, history of atrial fibrillation and multiple falls and fall risk.  I agree with continuing aspirin for stroke prevention and high fall risk.  Maintain aggressive risk factor modification.  Replace thyroid to normalize TSH level.  Management agitation as per psychiatry recommendations.  Trial of Namenda starter pack and if tolerated 10 mg twice daily to help improve cognition and behavior.  Return for follow-up in the future only as necessary. ? ? ?

## 2022-02-22 NOTE — Progress Notes (Signed)
? ?Location:  [facility] ?Nursing Home Room Number: 217-A ?Place of Service:  SNF (31) ?Provider:  Durenda Age, DNP, FNP-BC ? ?Patient Care Team: ?Hendricks Limes, MD as PCP - General (Internal Medicine) ?Freada Bergeron, MD as PCP - Cardiology (Cardiology) ?Carrolyn Meiers, MD (Internal Medicine) ?Satira Sark, MD (Cardiology) ?Rourk, Cristopher Estimable, MD (Gastroenterology) ?Medina-Vargas, Senaida Lange, NP as Nurse Practitioner (Internal Medicine) ?Medina-Vargas, Senaida Lange, NP as Nurse Practitioner (Internal Medicine) ? ?Extended Emergency Contact Information ?Primary Emergency Contact: Yow,Johnny ?Address: [address] ?         [place], [place] [address] [place] ?Home Phone: [phone] ?Relation: Son ?Secondary Emergency Contact: Yow,Della ?Mobile Phone: [phone] ?Relation: Other ? ?Code Status:  Full Code ? ?Goals of care: Advanced Directive information ? ?  02/22/2022  ? 12:43 PM  ?Advanced Directives  ?Does Patient Have a Medical Advance Directive? Yes  ?Type of Advance Directive Healthcare Power of Attorney  ?Does patient want to make changes to medical advance directive? No - Patient declined  ?Copy of [place] in Chart? Yes - validated most recent copy scanned in chart (See row information)  ? ? ? ?Chief Complaint  ?Patient presents with  ? Acute Visit  ?  Urinary tract infection   ? ? ?HPI:  ?Pt is a 71 y.o. female seen today for an acute visit regarding abnormal urine culture, She was reported to have been trying to get up several times unassisted despite staff reminding her to ask for assistance when needed. Urinalysis showed urine leukocyte esterase 250, urine protein 1+, urine WBC 21/50, urine bacteria trace, and urine mucus few.  Urine culture showed greater than 100,000 CFU/mL lactose fermenting gram-negative rods, ESBL producing microorganism, E. coli.  No reported hematuria nor fever. ? ? ?Past Medical History:  ?Diagnosis Date  ?  Arthritis   ? COPD (chronic obstructive pulmonary disease) ([place])   ? Diverticulosis   ? Essential hypertension, benign   ? Hx of colonic polyps   ? Hypothyroidism   ? IBS (irritable bowel syndrome)   ? Ketoacidosis   ? Mixed hyperlipidemia   ? PTSD (post-traumatic stress disorder)   ? S/P colonoscopy 11/2009  ? RMR: pancolonic diverticula, polypoid rectal mucosa with  prominent lymphoid aggregates, repeat in Jan 2016   ? Stress incontinence, female   ? Tobacco use   ? Type 2 diabetes mellitus ([place])   ? ?Past Surgical History:  ?Procedure Laterality Date  ? Bilateral tubal ligation    ? BREAST BIOPSY Left 10/16/2014  ? negative  ? CHOLECYSTECTOMY  09/11/2012  ? Procedure: LAPAROSCOPIC CHOLECYSTECTOMY;  Surgeon: Jamesetta So, MD;  Location: [facility];  Service: General;  Laterality: N/A;  ? COLONOSCOPY  11/17/2009  ? Dr. Gala Romney: normal rectum, pancolonic diverticula, polyps benign, no adenomas.   ? COLONOSCOPY N/A 02/12/2015  ? Dr. Gala Romney: colonic diverticulosis, multiple colonic polyps (tubular adenomas), negative segmental biopsies. Surveillance in 2021  ? ESOPHAGOGASTRODUODENOSCOPY  11/23/2011  ? Dr. Gala Romney: Erosive reflux esophagitis; small hiatal hernia; esophagus dilated empirically  ? MALONEY DILATION  11/23/2011  ? Procedure: MALONEY DILATION;  Surgeon: Daneil Dolin, MD;  Location: [facility];  Service: Endoscopy;  Laterality: N/A;  ? PERICARDIOCENTESIS N/A 11/06/2021  ? Procedure: PERICARDIOCENTESIS;  Surgeon: Jettie Booze, MD;  Location: [facility];  Service: Cardiovascular;  Laterality: N/A;  ? SKIN LESION EXCISION    ? on buttocks  ? TUBAL LIGATION    ? ? ?No  Known Allergies ? ?Outpatient Encounter Medications as of 02/22/2022  ?Medication Sig  ? acetaminophen (TYLENOL) 325 MG tablet Take 650 mg by mouth every 6 (six) hours as needed.  ? albuterol (VENTOLIN HFA) 108 (90 Base) MCG/ACT inhaler Inhale 2 puffs into the lungs every 4 (four) hours as needed for wheezing or shortness of breath.  ?  allopurinol (ZYLOPRIM) 100 MG tablet Take 50 mg by mouth daily. Idiopathic gout  ? aspirin 81 MG chewable tablet Chew 81 mg by mouth daily.  ? bisacodyl (DULCOLAX) 10 MG suppository Place 10 mg rectally daily as needed (constipation not relieved by MOM).  ? Calcium Carb-Cholecalciferol (OYSTER SHELL CALCIUM W/D) 500-200 MG-UNIT TABS Take 1 tablet by mouth 2 (two) times daily.  ? colchicine 0.6 MG tablet Take 1 tablet (0.6 mg total) by mouth daily.  ? colestipol (COLESTID) 1 g tablet TAKE 2 TABLETS DAILY FOR DIARRHEA. DO NOT TAKE WITHIN 2 HOURS OF OTHER ORAL MEDICATIONS. HOLD FOR CONSTIPATION.  ? diltiazem (DILACOR XR) 180 MG 24 hr capsule Take 180 mg by mouth daily.  ? divalproex (DEPAKOTE ER) 500 MG 24 hr tablet Take 1,500 mg by mouth at bedtime. FOR MOOD/BEHAVIORS  ? feeding supplement, GLUCERNA SHAKE, (GLUCERNA SHAKE) LIQD Take 237 mLs by mouth daily.  ? furosemide (LASIX) 20 MG tablet Take 1 tablet (20 mg total) by mouth every other day.  ? insulin aspart (NOVOLOG) 100 UNIT/ML injection Inject 5 Units into the skin 3 (three) times daily. For CBG >200  ? insulin detemir (LEVEMIR) 100 UNIT/ML injection Inject 0.12 mLs (12 Units total) into the skin 2 (two) times daily.  ? levothyroxine (SYNTHROID) 150 MCG tablet Take 150 mcg by mouth daily before breakfast.  ? magnesium hydroxide (MILK OF MAGNESIA) 400 MG/5ML suspension Take 30 mLs by mouth daily as needed (if no BM in 3 days).  ? mirtazapine (REMERON) 15 MG tablet Take 15 mg by mouth at bedtime.  ? nicotine (NICODERM CQ - DOSED IN MG/24 HR) 7 mg/24hr patch Place 7 mg onto the skin daily. X 1 month  ? nitrofurantoin (MACRODANTIN) 100 MG capsule Take 100 mg by mouth 2 (two) times daily. FOR UTI  ? Nutritional Supplements (NUTRITIONAL SUPPLEMENT PO) Take 1 each by mouth daily. Magic Cup  ? ondansetron (ZOFRAN) 4 MG tablet Take 1 tablet (4 mg total) by mouth every 8 (eight) hours as needed for nausea or vomiting.  ? pantoprazole (PROTONIX) 40 MG tablet TAKE 1 TABLET  BY MOUTH ONCE DAILY 30 MINTUES BEFORE BREAKFAST.  ? PARoxetine (PAXIL) 40 MG tablet Take 1 tablet (40 mg total) by mouth daily.  ? QUETIAPINE FUMARATE ER PO Take 200 mg by mouth at bedtime.  ? rosuvastatin (CRESTOR) 40 MG tablet Take 1 tablet (40 mg total) by mouth daily.  ? saccharomyces boulardii (FLORASTOR) 250 MG capsule Take 250 mg by mouth 2 (two) times daily.  ? senna-docusate (SENOKOT-S) 8.6-50 MG tablet Take 1 tablet by mouth at bedtime as needed for mild constipation.  ? Sodium Phosphates (RA SALINE ENEMA RE) Place 1 Dose rectally once as needed (severe constipation). If no relief from both milk of magnesia, and Bisacodyl suppository, administer one dose of disposable saline enema.  ? vitamin B-12 (CYANOCOBALAMIN) 500 MCG tablet Take 500 mcg by mouth daily.  ? memantine (NAMENDA TITRATION PAK) tablet pack 5 mg/day for =1 week; 5 mg twice daily for =1 week; 15 mg/day given in 5 mg and 10 mg separated doses for =1 week; then 10 mg twice daily  ? [  DISCONTINUED] clonazePAM (KLONOPIN) 0.5 MG tablet Take 0.5 mg by mouth.  ? [DISCONTINUED] diltiazem (CARDIZEM CD) 180 MG 24 hr capsule Take 180 mg by mouth daily.  ? ?No facility-administered encounter medications on file as of 02/22/2022.  ? ? ?Review of Systems  ?Constitutional:  Negative for appetite change, chills, fatigue and fever.  ?HENT:  Negative for congestion, hearing loss, rhinorrhea and sore throat.   ?Eyes: Negative.   ?Respiratory:  Negative for cough, shortness of breath and wheezing.   ?Cardiovascular:  Negative for chest pain, palpitations and leg swelling.  ?Gastrointestinal:  Negative for abdominal pain, constipation, diarrhea, nausea and vomiting.  ?Genitourinary:  Negative for dysuria.  ?Musculoskeletal:  Negative for arthralgias, back pain and myalgias.  ?Skin:  Negative for color change, rash and wound.  ?Neurological:  Negative for dizziness, weakness and headaches.  ?Psychiatric/Behavioral:  Negative for behavioral problems. The patient is  not nervous/anxious.    ? ? ? ?Immunization History  ?Administered Date(s) Administered  ? Fluad Quad(high Dose 65+) 07/09/2021  ? Moderna Sars-Covid-2 Vaccination 11/16/2019, 12/10/2019  ? Tdap 07/09/2021  ? Zost

## 2022-02-25 LAB — HEPATIC FUNCTION PANEL
ALT: 13 U/L (ref 7–35)
AST: 17 (ref 13–35)
Alkaline Phosphatase: 64 (ref 25–125)
Bilirubin, Total: 0.2

## 2022-02-25 LAB — LIPID PANEL
Cholesterol: 133 (ref 0–200)
HDL: 36 (ref 35–70)
LDL Cholesterol: 65
Triglycerides: 82 (ref 40–160)

## 2022-02-25 LAB — COMPREHENSIVE METABOLIC PANEL: Albumin: 3.3 — AB (ref 3.5–5.0)

## 2022-02-25 LAB — CBC AND DIFFERENTIAL: WBC: 4.2

## 2022-03-01 ENCOUNTER — Encounter: Payer: Self-pay | Admitting: Internal Medicine

## 2022-03-01 ENCOUNTER — Non-Acute Institutional Stay (SKILLED_NURSING_FACILITY): Payer: Medicare Other | Admitting: Internal Medicine

## 2022-03-01 DIAGNOSIS — E440 Moderate protein-calorie malnutrition: Secondary | ICD-10-CM | POA: Diagnosis not present

## 2022-03-01 DIAGNOSIS — E1122 Type 2 diabetes mellitus with diabetic chronic kidney disease: Secondary | ICD-10-CM

## 2022-03-01 DIAGNOSIS — N182 Chronic kidney disease, stage 2 (mild): Secondary | ICD-10-CM

## 2022-03-01 DIAGNOSIS — I129 Hypertensive chronic kidney disease with stage 1 through stage 4 chronic kidney disease, or unspecified chronic kidney disease: Secondary | ICD-10-CM

## 2022-03-01 DIAGNOSIS — Z8673 Personal history of transient ischemic attack (TIA), and cerebral infarction without residual deficits: Secondary | ICD-10-CM

## 2022-03-01 DIAGNOSIS — Z8739 Personal history of other diseases of the musculoskeletal system and connective tissue: Secondary | ICD-10-CM | POA: Insufficient documentation

## 2022-03-01 DIAGNOSIS — N1831 Chronic kidney disease, stage 3a: Secondary | ICD-10-CM

## 2022-03-01 DIAGNOSIS — E785 Hyperlipidemia, unspecified: Secondary | ICD-10-CM

## 2022-03-01 NOTE — Assessment & Plan Note (Signed)
Serial CT scans suggest that her dementia may be predominantly vascular in nature. She  is on Namenda. ?

## 2022-03-01 NOTE — Assessment & Plan Note (Signed)
Check uric acid level to see if deprescribing can occur. ?

## 2022-03-01 NOTE — Assessment & Plan Note (Signed)
She is on high-dose rosuvastatin 40 mg.  Last LDL on record was 81 on 10/7.  With history of stroke and diabetes, LDL goal is less than 70. ?

## 2022-03-01 NOTE — Assessment & Plan Note (Signed)
Most recent GFR indicated CKD stage IIIa.  A1c had improved from a value of 8.6% down to 7.8% on 11/05/2021.  A1c will be updated. ?

## 2022-03-01 NOTE — Progress Notes (Signed)
?  ? ?NURSING HOME LOCATION:  [facility] [place] ?ROOM NUMBER:  217 A ? ?CODE STATUS:  Full ? ?PCP:  Durenda Age NP,PSC ? ?This is a nursing facility follow up visit of chronic medical diagnoses & to document compliance with Regulation 483.30 (c) in [facility] [place] [facility] which mandates caregiver visit ( visits can alternate among physician, PA or NP as per statutes) within 10 days of 30 days / 60 days/ 90 days post admission to SNF date   ? ?Interim medical record and care since last SNF visit was updated with review of diagnostic studies and change in clinical status since last visit were documented. ? ?HPI: She is a permanent resident of this facility with medical diagnoses of COPD with bronchospasm , history of gout, hypothyroidism, history of stroke, history of heart failure, PAF, GERD with history of esophageal stricture, diabetes with dyslipidemia and essential hypertension, history of colon polyps, and IBS. ?Significant procedures include pericardiocentesis in December 2022 and esophageal dilation remotely. ?She has a phenomenal smoking history of 86 pack years. ? ?Labs are current and reveal an albumin of 3.3 and total protein 5.8.  TSH was therapeutic at 0.88.  Serially anemia has improved with H/H of 11/36 on 3/7.  Creatinine is 0.5. ?On 11/05/2021 A1c was 7.8%, down from a prior value of 8.6.  This would be adequate control based on age and comorbidities.  The last LDL on record is 81 on 08/14/2020. ? ?Review of systems: Neurocognitive deficit is clinically suggested.  When I asked her her room number as I wheeled her back from the hallway; her response was "[id number]"; her room number is actually 217 A.  When asked how she were doing the response was "pretty good".  Her main concern is that "Raquel Sarna, my baby daughter is depressed". ?She says has dysphagia and dysuria "sometimes".  The remainder of the review of systems was negative. ? ?Constitutional: No fever,  significant weight change, fatigue  ?Eyes: No redness, discharge, pain, vision change ?ENT/mouth: No nasal congestion,  purulent discharge, earache, change in hearing, sore throat  ?Cardiovascular: No chest pain, palpitations, paroxysmal nocturnal dyspnea, edema  ?Respiratory: No cough, sputum production, hemoptysis, DOE, significant snoring, apnea   ?Gastrointestinal: No heartburn, abdominal pain, nausea /vomiting, rectal bleeding, melena, change in bowels ?Genitourinary: No  hematuria, pyuria, nocturia ?Musculoskeletal: No joint stiffness, joint swelling, weakness, pain ?Dermatologic: No rash, pruritus, change in appearance of skin ?Neurologic: No dizziness, headache, syncope, seizures, numbness, tingling ?Psychiatric: No significant anxiety, depression, insomnia, anorexia ?Endocrine: No change in hair/skin/nails, excessive thirst, excessive hunger, excessive urination  ?Hematologic/lymphatic: No significant bruising, lymphadenopathy, abnormal bleeding ?Allergy/immunology: No itchy/watery eyes, significant sneezing, urticaria, angioedema ? ?Physical exam:  ?Pertinent or positive findings: She is much more alert than the last time I saw her but affect is still flat and facies masked.  She appears somewhat chronically ill and suboptimally nourished.  Hair is thin but groomed. Eyebrows are decreased laterally.  The left lateral supraorbital area is deviated inferiorly.  Eyes are sunken.  She is edentulous and facies weathered. Localized chin hirsutism present. Breath sounds are markedly distant.  Pedal pulses are palpable.  She has trace edema at the sock line.  Limbs are atrophic and interosseous wasting of the hands is present.  Deep tendon reflex is 0+ at the left knee.  Upper extremities reveal brisk reflexes.  The right upper and lower extremities are stronger to opposition than the left upper and left  lower extremities.  She has intermittent tremor of the upper extremities, worse with intention such as  attempting to hold a coffee cup. ? ?General appearance: no acute distress, increased work of breathing is present.   ?Lymphatic: No lymphadenopathy about the head, neck, axilla. ?Eyes: No conjunctival inflammation or lid edema is present. There is no scleral icterus. ?Ears:  External ear exam shows no significant lesions or deformities.   ?Nose:  External nasal examination shows no deformity or inflammation. Nasal mucosa are pink and moist without lesions, exudates ?Oral exam:   There is no oropharyngeal erythema or exudate. ?Neck:  No thyromegaly, masses, tenderness noted.    ?Heart:  Normal rate and regular rhythm. S1 and S2 normal without gallop, murmur, click, rub .  ?Lungs:  without wheezes, rhonchi, rales, rubs. ?Abdomen: Bowel sounds are normal. Abdomen is soft and nontender with no organomegaly, hernias, masses. ?GU: Deferred  ?Extremities:  No cyanosis, clubbing ?Neurologic exam :Balance, Rhomberg, finger to nose testing could not be completed due to clinical state ?Skin: Warm & dry w/o tenting. ?No significant lesions or rash. ? ?See summary under each active problem in the Problem List with associated updated therapeutic plan ? ? ?

## 2022-03-01 NOTE — Assessment & Plan Note (Signed)
This recent GFR in the chart was 58 indicating CKD stage IIIa.  Creatinine is current and reveals a value of 0.5.  No change in medications indicated; renal function monitor is necessary because of polypharmacy with some potentially nephrotoxic agents. ?

## 2022-03-01 NOTE — Assessment & Plan Note (Signed)
Current albumin 3.3 and total protein 5.8.  Nutritionist to follow at [facility]. ?

## 2022-03-02 NOTE — Patient Instructions (Signed)
See assessment and plan under each diagnosis in the problem list and acutely for this visit 

## 2022-03-09 ENCOUNTER — Non-Acute Institutional Stay (SKILLED_NURSING_FACILITY): Payer: Medicare Other | Admitting: Adult Health

## 2022-03-09 ENCOUNTER — Encounter: Payer: Self-pay | Admitting: Adult Health

## 2022-03-09 DIAGNOSIS — E039 Hypothyroidism, unspecified: Secondary | ICD-10-CM

## 2022-03-09 DIAGNOSIS — E1122 Type 2 diabetes mellitus with diabetic chronic kidney disease: Secondary | ICD-10-CM

## 2022-03-09 DIAGNOSIS — I129 Hypertensive chronic kidney disease with stage 1 through stage 4 chronic kidney disease, or unspecified chronic kidney disease: Secondary | ICD-10-CM | POA: Diagnosis not present

## 2022-03-09 DIAGNOSIS — E162 Hypoglycemia, unspecified: Secondary | ICD-10-CM | POA: Diagnosis not present

## 2022-03-09 DIAGNOSIS — N182 Chronic kidney disease, stage 2 (mild): Secondary | ICD-10-CM

## 2022-03-18 NOTE — Progress Notes (Signed)
? ?Location:    ?  ?Place of Service:    ?Provider:  Durenda Age, DNP, FNP-BC ? ?Patient Care Team: ?Hendricks Limes, MD as PCP - General (Internal Medicine) ?Freada Bergeron, MD as PCP - Cardiology (Cardiology) ?Carrolyn Meiers, MD (Internal Medicine) ?Satira Sark, MD (Cardiology) ?Rourk, Cristopher Estimable, MD (Gastroenterology) ?Medina-Vargas, Senaida Lange, NP as Nurse Practitioner (Internal Medicine) ?Medina-Vargas, Senaida Lange, NP as Nurse Practitioner (Internal Medicine) ? ?Extended Emergency Contact Information ?Primary Emergency Contact: Yow,Johnny ?Address: [address] ?         [place], [place] [address] [place] ?Home Phone: [phone] ?Relation: Son ?Secondary Emergency Contact: Yow,Della ?Mobile Phone: [phone] ?Relation: Other ? ?Code Status:    Full Code ? ?Goals of care: Advanced Directive information ? ?  02/22/2022  ? 12:43 PM  ?Advanced Directives  ?Does Patient Have a Medical Advance Directive? Yes  ?Type of Advance Directive Healthcare Power of Attorney  ?Does patient want to make changes to medical advance directive? No - Patient declined  ?Copy of [place] in Chart? Yes - validated most recent copy scanned in chart (See row information)  ? ? ? ?Chief Complaint  ?Patient presents with  ? Acute Visit  ?  Hypoglycemia  ? ? ?HPI:  ?Pt is a 71 y.o. female seen today for an acute visit for hypoglycemia. CBG low episodes -  53, 74, 92 and 95. She currently takes Novolog 5 units SQ TID for CBG >200 and Levemir 12 units SQ BID for diabetes mellitus. She takes Levothyroxine 150 mcg 1 tab daily for hypothyroidism.  ? ? ?Past Medical History:  ?Diagnosis Date  ? Arthritis   ? COPD (chronic obstructive pulmonary disease) ([place])   ? Diverticulosis   ? Essential hypertension, benign   ? Hx of colonic polyps   ? Hypothyroidism   ? IBS (irritable bowel syndrome)   ? Ketoacidosis   ? Mixed hyperlipidemia   ? PTSD (post-traumatic stress disorder)   ? S/P  colonoscopy 11/2009  ? RMR: pancolonic diverticula, polypoid rectal mucosa with  prominent lymphoid aggregates, repeat in Jan 2016   ? Stress incontinence, female   ? Tobacco use   ? Type 2 diabetes mellitus ([place])   ? ?Past Surgical History:  ?Procedure Laterality Date  ? Bilateral tubal ligation    ? BREAST BIOPSY Left 10/16/2014  ? negative  ? CHOLECYSTECTOMY  09/11/2012  ? Procedure: LAPAROSCOPIC CHOLECYSTECTOMY;  Surgeon: Jamesetta So, MD;  Location: [facility];  Service: General;  Laterality: N/A;  ? COLONOSCOPY  11/17/2009  ? Dr. Gala Romney: normal rectum, pancolonic diverticula, polyps benign, no adenomas.   ? COLONOSCOPY N/A 02/12/2015  ? Dr. Gala Romney: colonic diverticulosis, multiple colonic polyps (tubular adenomas), negative segmental biopsies. Surveillance in 2021  ? ESOPHAGOGASTRODUODENOSCOPY  11/23/2011  ? Dr. Gala Romney: Erosive reflux esophagitis; small hiatal hernia; esophagus dilated empirically  ? MALONEY DILATION  11/23/2011  ? Procedure: MALONEY DILATION;  Surgeon: Daneil Dolin, MD;  [facility];  Service: Endoscopy;  Laterality: N/A;  ? PERICARDIOCENTESIS N/A 11/06/2021  ? Procedure: PERICARDIOCENTESIS;  Surgeon: Jettie Booze, MD;  Location: [facility];  Service: Cardiovascular;  Laterality: N/A;  ? SKIN LESION EXCISION    ? on buttocks  ? TUBAL LIGATION    ? ? ?No Known Allergies ? ?Outpatient Encounter Medications as of 03/09/2022  ?Medication Sig  ? acetaminophen (TYLENOL) 325 MG tablet Take 650 mg by mouth every 6 (six) hours as needed.  ?  albuterol (VENTOLIN HFA) 108 (90 Base) MCG/ACT inhaler Inhale 2 puffs into the lungs every 4 (four) hours as needed for wheezing or shortness of breath.  ? allopurinol (ZYLOPRIM) 100 MG tablet Take 50 mg by mouth daily. Idiopathic gout  ? aspirin 81 MG chewable tablet Chew 81 mg by mouth daily.  ? bisacodyl (DULCOLAX) 10 MG suppository Place 10 mg rectally daily as needed (constipation not relieved by MOM).  ? Calcium Carb-Cholecalciferol (OYSTER  SHELL CALCIUM W/D) 500-200 MG-UNIT TABS Take 1 tablet by mouth 2 (two) times daily.  ? colchicine 0.6 MG tablet Take 1 tablet (0.6 mg total) by mouth daily.  ? colestipol (COLESTID) 1 g tablet TAKE 2 TABLETS DAILY FOR DIARRHEA. DO NOT TAKE WITHIN 2 HOURS OF OTHER ORAL MEDICATIONS. HOLD FOR CONSTIPATION.  ? diltiazem (DILACOR XR) 180 MG 24 hr capsule Take 180 mg by mouth daily.  ? divalproex (DEPAKOTE ER) 500 MG 24 hr tablet Take 1,500 mg by mouth at bedtime. FOR MOOD/BEHAVIORS  ? feeding supplement, GLUCERNA SHAKE, (GLUCERNA SHAKE) LIQD Take 237 mLs by mouth daily.  ? furosemide (LASIX) 20 MG tablet Take 1 tablet (20 mg total) by mouth every other day.  ? insulin aspart (NOVOLOG) 100 UNIT/ML injection Inject 5 Units into the skin 3 (three) times daily. For CBG >200  ? insulin detemir (LEVEMIR) 100 UNIT/ML injection Inject 0.12 mLs (12 Units total) into the skin 2 (two) times daily.  ? levothyroxine (SYNTHROID) 150 MCG tablet Take 150 mcg by mouth daily before breakfast.  ? magnesium hydroxide (MILK OF MAGNESIA) 400 MG/5ML suspension Take 30 mLs by mouth daily as needed (if no BM in 3 days).  ? memantine (NAMENDA) 10 MG tablet Take 10 mg by mouth 2 (two) times daily.  ? mirtazapine (REMERON) 15 MG tablet Take 15 mg by mouth at bedtime.  ? Nutritional Supplements (NUTRITIONAL SUPPLEMENT PO) Take 1 each by mouth daily. Magic Cup  ? ondansetron (ZOFRAN) 4 MG tablet Take 1 tablet (4 mg total) by mouth every 8 (eight) hours as needed for nausea or vomiting.  ? pantoprazole (PROTONIX) 40 MG tablet TAKE 1 TABLET BY MOUTH ONCE DAILY 30 MINTUES BEFORE BREAKFAST.  ? PARoxetine (PAXIL) 40 MG tablet Take 1 tablet (40 mg total) by mouth daily.  ? QUETIAPINE FUMARATE ER PO Take 200 mg by mouth at bedtime.  ? rosuvastatin (CRESTOR) 40 MG tablet Take 1 tablet (40 mg total) by mouth daily.  ? senna-docusate (SENOKOT-S) 8.6-50 MG tablet Take 1 tablet by mouth at bedtime as needed for mild constipation.  ? Sodium Phosphates (RA SALINE  ENEMA RE) Place 1 Dose rectally once as needed (severe constipation). If no relief from both milk of magnesia, and Bisacodyl suppository, administer one dose of disposable saline enema.  ? vitamin B-12 (CYANOCOBALAMIN) 500 MCG tablet Take 500 mcg by mouth daily.  ? [DISCONTINUED] memantine (NAMENDA TITRATION PAK) tablet pack 5 mg/day for =1 week; 5 mg twice daily for =1 week; 15 mg/day given in 5 mg and 10 mg separated doses for =1 week; then 10 mg twice daily  ? [DISCONTINUED] nicotine (NICODERM CQ - DOSED IN MG/24 HR) 7 mg/24hr patch Place 7 mg onto the skin daily. X 1 month  ? ?No facility-administered encounter medications on file as of 03/09/2022.  ? ? ?Review of Systems  ?Constitutional:  Negative for appetite change, chills, fatigue and fever.  ?HENT:  Negative for congestion, hearing loss, rhinorrhea and sore throat.   ?Eyes: Negative.   ?Respiratory:  Negative for  cough, shortness of breath and wheezing.   ?Cardiovascular:  Negative for chest pain, palpitations and leg swelling.  ?Gastrointestinal:  Negative for abdominal pain, constipation, diarrhea, nausea and vomiting.  ?Genitourinary:  Negative for dysuria.  ?Musculoskeletal:  Negative for arthralgias, back pain and myalgias.  ?Skin:  Negative for color change, rash and wound.  ?Neurological:  Negative for dizziness, weakness and headaches.  ?Psychiatric/Behavioral:  Negative for behavioral problems. The patient is not nervous/anxious.    ? ? ? ?Immunization History  ?Administered Date(s) Administered  ? Fluad Quad(high Dose 65+) 07/09/2021  ? Moderna Sars-Covid-2 Vaccination 11/16/2019, 12/10/2019  ? Tdap 07/09/2021  ? Zoster Recombinat (Shingrix) 07/09/2021  ? ?Pertinent  Health Maintenance Due  ?Topic Date Due  ? OPHTHALMOLOGY EXAM  Never done  ? HEMOGLOBIN A1C  05/06/2022  ? INFLUENZA VACCINE  06/08/2022  ? FOOT EXAM  08/04/2022  ? URINE MICROALBUMIN  11/20/2022  ? MAMMOGRAM  10/15/2023  ? COLONOSCOPY (Pts 45-41yr Insurance coverage will need to be  confirmed)  02/11/2025  ? DEXA SCAN  Completed  ? ? ?  11/09/2021  ?  8:25 AM 11/10/2021  ?  8:40 AM 11/28/2021  ?  7:48 PM 12/02/2021  ? 11:30 AM 12/31/2021  ? 12:42 PM  ?Fall Risk  ?Falls in the past year?     1  ?

## 2022-03-26 ENCOUNTER — Non-Acute Institutional Stay (SKILLED_NURSING_FACILITY): Payer: Medicare Other | Admitting: Adult Health

## 2022-03-26 ENCOUNTER — Encounter: Payer: Self-pay | Admitting: Adult Health

## 2022-03-26 DIAGNOSIS — Z8673 Personal history of transient ischemic attack (TIA), and cerebral infarction without residual deficits: Secondary | ICD-10-CM

## 2022-03-26 DIAGNOSIS — I10 Essential (primary) hypertension: Secondary | ICD-10-CM

## 2022-03-26 DIAGNOSIS — E1122 Type 2 diabetes mellitus with diabetic chronic kidney disease: Secondary | ICD-10-CM

## 2022-03-26 DIAGNOSIS — N182 Chronic kidney disease, stage 2 (mild): Secondary | ICD-10-CM

## 2022-03-26 DIAGNOSIS — Z9181 History of falling: Secondary | ICD-10-CM

## 2022-03-26 DIAGNOSIS — I129 Hypertensive chronic kidney disease with stage 1 through stage 4 chronic kidney disease, or unspecified chronic kidney disease: Secondary | ICD-10-CM

## 2022-03-26 NOTE — Progress Notes (Signed)
Location:  [facility] Room Number: [place] of Service:  SNF (31) Provider:  Durenda Age, DNP, FNP-BC  Patient Care Team: Hendricks Limes, MD as PCP - General (Internal Medicine) Freada Bergeron, MD as PCP - Cardiology (Cardiology) Carrolyn Meiers, MD (Internal Medicine) Satira Sark, MD (Cardiology) [name], MD (Gastroenterology) Medina-Vargas, Senaida Lange, NP as Nurse Practitioner (Internal Medicine) [name], NP as Nurse Practitioner (Internal Medicine)  Extended Emergency Contact Information Primary Emergency Contact: [facility] Address: [address]          [place], [place] [address] [name] of [place] Phone: [phone] Relation: Son Secondary Emergency Contact: [place] Mobile Phone: [phone] Relation: Other  Code Status:  Full Code   Goals of care: Advanced Directive information    03/26/2022    2:51 PM  Advanced Directives  Does Patient Have a Medical Advance Directive? Yes  Type of Advance Directive [place]  Does patient want to make changes to medical advance directive? No - Patient declined  Copy of [place] in Chart? Yes - validated most recent copy scanned in chart (See row information)     Chief Complaint  Patient presents with   Acute Visit    S/P Fall    HPI:  Pt is a 71 y.o. female seen today for an acute visit S/P fall. She was reported to have fallen and was noted to have a small bruising on her left forehead and abrasion on her left shoulder. She has left-sided weakness due to a history of CVA.  She does not take any anticoagulant except for ASA 81 mg daily. She denies pain. BPs continue from 107-154, with outlier 167.  She takes diltiazem 24-hour ER 180 mg 1 capsule daily for hypertension.CBGs ranging from 107 to 292. She takes Levemir 8 units subcutaneously twice a day and NovoLog 5 units subcutaneously 3 times a day for  CBG >200 for diabetes mellitus.                            Past Medical History:  Diagnosis Date   Arthritis    COPD (chronic obstructive pulmonary disease) ([place])    Diverticulosis    Essential hypertension, benign    Hx of colonic polyps    Hypothyroidism    IBS (irritable bowel syndrome)    Ketoacidosis    Mixed hyperlipidemia    PTSD (post-traumatic stress disorder)    S/P colonoscopy 11/2009   RMR: pancolonic diverticula, polypoid rectal mucosa with  prominent lymphoid aggregates, repeat in Jan 2016    Stress incontinence, female    Tobacco use    Type 2 diabetes mellitus ([place])    Past Surgical History:  Procedure Laterality Date   Bilateral tubal ligation     BREAST BIOPSY Left 10/16/2014   negative   CHOLECYSTECTOMY  09/11/2012   Procedure: LAPAROSCOPIC CHOLECYSTECTOMY;  Surgeon: Jamesetta So, MD;  Location: [facility];  Service: General;  Laterality: N/A;   COLONOSCOPY  11/17/2009   Dr. Gala Romney: normal rectum, pancolonic diverticula, polyps benign, no adenomas.    COLONOSCOPY N/A 02/12/2015   Dr. Gala Romney: colonic diverticulosis, multiple colonic polyps (tubular adenomas), negative segmental biopsies. Surveillance in 2021   ESOPHAGOGASTRODUODENOSCOPY  11/23/2011   Dr. Gala Romney: Erosive reflux esophagitis; small hiatal hernia; esophagus dilated empirically   MALONEY DILATION  11/23/2011   Procedure: Venia Minks DILATION;  Surgeon: Daneil Dolin, MD;  Location: [facility];  Service: Endoscopy;  Laterality: N/A;   PERICARDIOCENTESIS N/A 11/06/2021   Procedure: PERICARDIOCENTESIS;  Surgeon: Jettie Booze, MD;  Location: [facility];  Service: Cardiovascular;  Laterality: N/A;   SKIN LESION EXCISION     on buttocks   TUBAL LIGATION      No Known Allergies  Outpatient Encounter Medications as of 03/26/2022  Medication Sig   acetaminophen (TYLENOL) 325 MG tablet Take 650 mg by mouth every 6 (six) hours as needed.   albuterol (VENTOLIN HFA) 108 (90 Base) MCG/ACT inhaler  Inhale 2 puffs into the lungs every 4 (four) hours as needed for wheezing or shortness of breath.   allopurinol (ZYLOPRIM) 100 MG tablet Take 50 mg by mouth daily. Idiopathic gout   aspirin 81 MG chewable tablet Chew 81 mg by mouth daily.   bisacodyl (DULCOLAX) 10 MG suppository Place 10 mg rectally daily as needed (constipation not relieved by MOM).   Calcium Carb-Cholecalciferol (OYSTER SHELL CALCIUM W/D) 500-200 MG-UNIT TABS Take 1 tablet by mouth 2 (two) times daily.   colchicine 0.6 MG tablet Take 1 tablet (0.6 mg total) by mouth daily.   colestipol (COLESTID) 1 g tablet TAKE 2 TABLETS DAILY FOR DIARRHEA. DO NOT TAKE WITHIN 2 HOURS OF OTHER ORAL MEDICATIONS. HOLD FOR CONSTIPATION.   diltiazem (DILACOR XR) 180 MG 24 hr capsule Take 180 mg by mouth daily.   divalproex (DEPAKOTE ER) 500 MG 24 hr tablet Take 1,500 mg by mouth at bedtime. FOR MOOD/BEHAVIORS   feeding supplement, GLUCERNA SHAKE, (GLUCERNA SHAKE) LIQD Take 237 mLs by mouth daily.   furosemide (LASIX) 20 MG tablet Take 1 tablet (20 mg total) by mouth every other day.   insulin aspart (NOVOLOG) 100 UNIT/ML injection Inject 5 Units into the skin 3 (three) times daily. For CBG >200   insulin detemir (LEVEMIR) 100 UNIT/ML FlexPen Inject 8 Units into the skin 2 (two) times daily.   levothyroxine (SYNTHROID) 150 MCG tablet Take 150 mcg by mouth daily before breakfast.   magnesium hydroxide (MILK OF MAGNESIA) 400 MG/5ML suspension Take 30 mLs by mouth daily as needed (if no BM in 3 days).   memantine (NAMENDA) 10 MG tablet Take 10 mg by mouth 2 (two) times daily.   mirtazapine (REMERON) 15 MG tablet Take 15 mg by mouth at bedtime.   Nutritional Supplements (NUTRITIONAL SUPPLEMENT PO) Take 1 each by mouth daily. Magic Cup   ondansetron (ZOFRAN) 4 MG tablet Take 1 tablet (4 mg total) by mouth every 8 (eight) hours as needed for nausea or vomiting.   pantoprazole (PROTONIX) 40 MG tablet TAKE 1 TABLET BY MOUTH ONCE DAILY 30 MINTUES BEFORE  BREAKFAST.   PARoxetine (PAXIL) 40 MG tablet Take 1 tablet (40 mg total) by mouth daily.   QUETIAPINE FUMARATE ER PO Take 200 mg by mouth at bedtime.   rosuvastatin (CRESTOR) 40 MG tablet Take 1 tablet (40 mg total) by mouth daily.   senna-docusate (SENOKOT-S) 8.6-50 MG tablet Take 1 tablet by mouth at bedtime as needed for mild constipation.   Sodium Phosphates (RA SALINE ENEMA RE) Place 1 Dose rectally once as needed (severe constipation). If no relief from both milk of magnesia, and Bisacodyl suppository, administer one dose of disposable saline enema.   vitamin B-12 (CYANOCOBALAMIN) 500 MCG tablet Take 500 mcg by mouth daily.   [DISCONTINUED] insulin detemir (LEVEMIR) 100 UNIT/ML injection Inject 0.12 mLs (12 Units total) into the skin 2 (two) times daily. (Patient not taking: Reported on  03/26/2022)   No facility-administered encounter medications on file as of 03/26/2022.    Review of Systems  Constitutional:  Negative for appetite change, chills, fatigue and fever.  HENT:  Negative for congestion, hearing loss, rhinorrhea and sore throat.   Eyes: Negative.   Respiratory:  Negative for cough, shortness of breath and wheezing.   Cardiovascular:  Negative for chest pain, palpitations and leg swelling.  Gastrointestinal:  Negative for abdominal pain, constipation, diarrhea, nausea and vomiting.  Genitourinary:  Negative for dysuria.  Musculoskeletal:  Negative for arthralgias, back pain and myalgias.  Skin:  Negative for color change, rash and wound.  Neurological:  Negative for dizziness, weakness and headaches.  Psychiatric/Behavioral:  Negative for behavioral problems. The patient is not nervous/anxious.       Immunization History  Administered Date(s) Administered   Fluad Quad(high Dose 65+) 07/09/2021   Moderna Sars-Covid-2 Vaccination 11/16/2019, 12/10/2019   Tdap 07/09/2021   Zoster Recombinat (Shingrix) 07/09/2021   Pertinent  Health Maintenance Due  Topic Date Due    OPHTHALMOLOGY EXAM  Never done   HEMOGLOBIN A1C  05/06/2022   INFLUENZA VACCINE  06/08/2022   FOOT EXAM  08/04/2022   URINE MICROALBUMIN  11/20/2022   MAMMOGRAM  10/15/2023   COLONOSCOPY (Pts 45-43yr Insurance coverage will need to be confirmed)  02/11/2025   DEXA SCAN  Completed      11/09/2021    8:25 AM 11/10/2021    8:40 AM 11/28/2021    7:48 PM 12/02/2021   11:30 AM 12/31/2021   12:42 PM  FMcClellan Parkin the past year?     1  Was there an injury with Fall?     0  Fall Risk Category Calculator     2  Fall Risk Category     Moderate  Patient Fall Risk Level High fall risk High fall risk High fall risk High fall risk Moderate fall risk  Patient at Risk for Falls Due to     History of fall(s);Impaired mobility  Fall risk Follow up     Education provided;Falls prevention discussed     Vitals:   03/26/22 1515  BP: 137/81  Pulse: 86  Resp: 19  Temp: 97.7 F (36.5 C)  Weight: 122 lb 6.4 oz (55.5 kg)  Height: '5\' 4"'$  (1.626 m)   Body mass index is 21.01 kg/m.  Physical Exam Constitutional:      Appearance: Normal appearance.  HENT:     Head: Normocephalic and atraumatic.     Nose: Nose normal.     Mouth/Throat:     Mouth: Mucous membranes are moist.  Eyes:     Conjunctiva/sclera: Conjunctivae normal.  Cardiovascular:     Rate and Rhythm: Normal rate and regular rhythm.  Pulmonary:     Effort: Pulmonary effort is normal.     Breath sounds: Normal breath sounds.  Abdominal:     General: Bowel sounds are normal.     Palpations: Abdomen is soft.  Musculoskeletal:     Cervical back: Normal range of motion.  Skin:    General: Skin is warm and dry.  Neurological:     Mental Status: She is alert and oriented to person, place, and time.     Comments: Left-sided weakness.  Psychiatric:        Mood and Affect: Mood normal.        Behavior: Behavior normal.       Labs reviewed: Recent Labs    11/05/21 0425 11/06/21 0319 12/02/21  1208 12/14/21 0000  12/29/21 0000 01/12/22 0000 02/08/22 0000  NA 133* 136 134*   < > 137 142 139  K 4.6 4.1 4.4   < > 4.1 4.3 4.7  CL 96* 97* 101   < > 97* 101 102  CO2 '26 31 26   '$ < > 33* 28* 27*  GLUCOSE 210* 172* 229*  --   --   --   --   BUN 54* 44* 18   < > 23* 22* 22*  CREATININE 1.05* 0.99 0.83   < > 0.7 0.7 0.5  CALCIUM 9.1 10.3 9.5   < > 9.5 10.2 9.9  MG  --  1.7  --   --   --   --   --   PHOS  --  2.6  --   --   --   --   --    < > = values in this interval not displayed.   Recent Labs    11/04/21 1959 11/05/21 0425 12/02/21 1208 12/29/21 0000 01/23/22 0000 02/08/22 0000 02/25/22 0000  AST 42* 44* 16 12* 10* 16 17  ALT '25 27 12 11 9 11 13  '$ ALKPHOS 70 61 59 120 77 70 64  BILITOT 0.7 1.1 0.5  --   --   --   --   PROT 6.2* 5.5* 5.8*  --   --   --   --   ALBUMIN 2.4* 2.2* 2.6* 2.8*  --  3.3* 3.3*   Recent Labs    11/05/21 0425 11/06/21 0319 12/02/21 1208 12/14/21 0000 01/12/22 0000 02/19/22 0000 02/22/22 0000 02/25/22 0000  WBC 7.6 7.1 7.4   < > 11.5  --  5.2 4.2  NEUTROABS  --  3.6 5.3  --   --  1.40 1.40  --   HGB 10.7* 11.3* 12.3   < > 11.0* 10.7* 12.2  --   HCT 32.3* 33.3* 37.4   < > 36 32* 37  --   MCV 96.1 92.8 99.5  --   --   --   --   --   PLT 272 264 169   < > 225 177 178  --    < > = values in this interval not displayed.   Lab Results  Component Value Date   TSH 0.88 01/28/2022   Lab Results  Component Value Date   HGBA1C 7.8 (H) 11/05/2021   Lab Results  Component Value Date   CHOL 133 02/25/2022   HDL 36 02/25/2022   LDLCALC 65 02/25/2022   TRIG 82 02/25/2022   CHOLHDL 7.1 09/03/2021    Significant Diagnostic Results in last 30 days:  No results found.  Assessment/Plan 1. Status post fall -  she denies pain -  staff to assist patient to bathroom and to do frequent monitoring  2. History of stroke -  stable, staff to do neuro checks -Aspirin 81 mg daily and rosuvastatin 40 mg 1 tab at bedtime  3. Essential hypertension, benign Stable,  continue current medication  4. 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 ([place]) Lab Results  Component Value Date   HGBA1C 7.8 (H) 11/05/2021   -  continue Levemir and NovoLog -   Monitor CBGs   Family/ staff Communication: Discussed plan of care with resident and charge nurse.  Labs/tests ordered:   None    Durenda Age, DNP, MSN, FNP-BC [facility] [phone] (Monday-Friday 8:00 a.m. - 5:00 p.m.)  [phone] (after hours)  

## 2022-07-22 NOTE — Progress Notes (Deleted)
Cardiology Office Note:    Date:  07/22/2022   ID:  Claudia Keller, DOB 11/15/1950, MRN [id number]  PCP:  Elmore Guise, MD   [facility] Providers Cardiologist:  Freada Bergeron, MD {  Referring MD: Elmore Guise, MD    History of Present Illness:    Claudia Keller is a 71 y.o. female with a hx of DMII, COPD, hypothyroidism, HTN, history of smoking, pAfib, prior CVA, and HLD who presents to clinic for follow-up.  Patient was seen in 06/2011 by Dr. Domenic Polite for chest pain.  TTE revealed borderline LVH with normal LV function 60 to 65%.  Mildly increased RV wall thickness and normal cavity size.  Her stress test was probably negative revealing impaired exercise capacity and normal LV size, normal LV systolic function and no significant stress-induced EKG abnormalities.  Overall, no evidence of ischemia.  Patient was admitted 1/30-12/12/2020 for acute hypoxemic respiratory failure secondary to COVID-pneumonia.  She was discharged on 2 L of oxygen.  She had transient atrial fibrillation that was treated with diltiazem and converted to sinus rhythm.  No additional atrial fibrillation was seen during that admission.    She was admitted 10/26-10/28/2022 with a CVA, discharged to SNF. Initially, a TEE and monitor recommended, but since she had been previously diagnosed with atrial fibrillation, she was just started on anticoagulation.  This was subsequently stopped due to frequent falls.   Re-presented to the ER in 10/2021 for confusion and hypoxemia. She was hypoxic at that time. CT negative for PE but showed a pericardial effusion. Follow-up TTE 11/05/21 confirmed a large pericardial effusion with no tamponade. She underwent pericardiocentesis on 11/06/21. Repeat TTE 01/27/22 showed EF 60-65%, normal RV, trivial pericardial effusion.  Was last seen in clinic by Nicholes Rough in 01/2022 where she was unable to hold herself up in a chair which was her baseline after a stroke.  She also has significant dementia. No changes made at that time.   Today, ***  Past Medical History:  Diagnosis Date   Arthritis    COPD (chronic obstructive pulmonary disease) ([place])    Diverticulosis    Essential hypertension, benign    Hx of colonic polyps    Hypothyroidism    IBS (irritable bowel syndrome)    Ketoacidosis    Mixed hyperlipidemia    PTSD (post-traumatic stress disorder)    S/P colonoscopy 11/2009   RMR: pancolonic diverticula, polypoid rectal mucosa with  prominent lymphoid aggregates, repeat in Jan 2016    Stress incontinence, female    Tobacco use    Type 2 diabetes mellitus ([place])     Past Surgical History:  Procedure Laterality Date   Bilateral tubal ligation     BREAST BIOPSY Left 10/16/2014   negative   CHOLECYSTECTOMY  09/11/2012   Procedure: LAPAROSCOPIC CHOLECYSTECTOMY;  Surgeon: Jamesetta So, MD;  Location: [facility];  Service: General;  Laterality: N/A;   COLONOSCOPY  11/17/2009   Dr. Gala Romney: normal rectum, pancolonic diverticula, polyps benign, no adenomas.    COLONOSCOPY N/A 02/12/2015   Dr. Gala Romney: colonic diverticulosis, multiple colonic polyps (tubular adenomas), negative segmental biopsies. Surveillance in 2021   ESOPHAGOGASTRODUODENOSCOPY  11/23/2011   Dr. Gala Romney: Erosive reflux esophagitis; small hiatal hernia; esophagus dilated empirically   MALONEY DILATION  11/23/2011   Procedure: Venia Minks DILATION;  Surgeon: Daneil Dolin, MD;  [facility];  Service: Endoscopy;  Laterality: N/A;   PERICARDIOCENTESIS N/A 11/06/2021   Procedure: PERICARDIOCENTESIS;  Surgeon: [name]  S, MD;  Location: [facility];  Service: Cardiovascular;  Laterality: N/A;   SKIN LESION EXCISION     on buttocks   TUBAL LIGATION      Current Medications: No outpatient medications have been marked as taking for the 08/02/22 encounter (Appointment) with Freada Bergeron, MD.     Allergies:   Patient has no known allergies.   Social History    Socioeconomic History   Marital status: Legally Separated    Spouse name: Not on file   Number of children: Not on file   Years of education: Not on file   Highest education level: Not on file  Occupational History   Not on file  Tobacco Use   Smoking status: Every Day    Packs/day: 2.00    Years: 43.00    Total pack years: 86.00    Types: Cigarettes   Smokeless tobacco: Never  Vaping Use   Vaping Use: Never used  Substance and Sexual Activity   Alcohol use: No    Alcohol/week: 0.0 standard drinks of alcohol   Drug use: No   Sexual activity: Not Currently    Birth control/protection: Surgical, Post-menopausal    Comment: tubal  Other Topics Concern   Not on file  Social History Narrative   Not on file   Social Determinants of Health   Financial Resource Strain: Not on file  Food Insecurity: No Food Insecurity (12/05/2020)   Hunger Vital Sign    Worried About Running Out of Food in the Last Year: Never true    Ran Out of Food in the Last Year: Never true  Transportation Needs: No Transportation Needs (12/05/2020)   PRAPARE - Hydrologist (Medical): No    Lack of Transportation (Non-Medical): No  Physical Activity: Not on file  Stress: Not on file  Social Connections: Not on file     Family History: The patient's ***family history includes Cancer in her mother and son; Coronary artery disease in an other family member; Diabetes type II in an other family member; Heart attack in her father; Heart disease in her father; Schizophrenia in her brother; Stroke in her brother. There is no history of Colon cancer or Breast cancer.  ROS:   Please see the history of present illness.    *** All other systems reviewed and are negative.  EKGs/Labs/Other Studies Reviewed:    The following studies were reviewed today: TTE 22-Feb-2022: IMPRESSIONS   1. Left ventricular ejection fraction, by estimation, is 60 to 65%. The  left ventricle has normal  function.   2. Right ventricular systolic function is normal.   3. There is no evidence of cardiac tamponade.  EKG:  EKG is *** ordered today.  The ekg ordered today demonstrates ***  Recent Labs: 11/04/2021: B Natriuretic Peptide 247.7 11/06/2021: Magnesium 1.7 01/28/2022: TSH 0.88 02/08/2022: BUN 22; Creatinine 0.5; Potassium 4.7; Sodium 139 02/22/2022: Hemoglobin 12.2; Platelets 178 02/25/2022: ALT 13  Recent Lipid Panel    Component Value Date/Time   CHOL 133 02/25/2022 0000   TRIG 82 02/25/2022 0000   HDL 36 02/25/2022 0000   CHOLHDL 7.1 09/03/2021 0357   VLDL 34 09/03/2021 0357   LDLCALC 65 02/25/2022 0000   LDLCALC 81 08/14/2020 1042     Risk Assessment/Calculations:   {Does this patient have ATRIAL FIBRILLATION?:[phone]}  No BP recorded.  {Refresh Note OR Click here to enter BP  :1}***         Physical  Exam:    VS:  There were no vitals taken for this visit.    Wt Readings from Last 3 Encounters:  03/26/22 122 lb 6.4 oz (55.5 kg)  03/09/22 122 lb 6.4 oz (55.5 kg)  02/22/22 125 lb 6.4 oz (56.9 kg)     GEN: *** Well nourished, well developed in no acute distress HEENT: Normal NECK: No JVD; No carotid bruits LYMPHATICS: No lymphadenopathy CARDIAC: ***RRR, no murmurs, rubs, gallops RESPIRATORY:  Clear to auscultation without rales, wheezing or rhonchi  ABDOMEN: Soft, non-tender, non-distended MUSCULOSKELETAL:  No edema; No deformity  SKIN: Warm and dry NEUROLOGIC:  Alert and oriented x 3 PSYCHIATRIC:  Normal affect   ASSESSMENT:    No diagnosis found. PLAN:    In order of problems listed above:  #Pericardial Effusion: TTE 10/2021 showed large effusion s/p pericardiocentesis. Repeat TTE 01/2022 showed only trivial pericardial effusion.   #Paroxysmal Afib: CHADs-vasc 8. Was previously on apixaban but this has been stopped due to frequent falls.  -Continue dilt 134m daily -Off AC due to frequent falls  #History of CVA: Has significant left sided  weakness.   #HTN: -Continue dilt 1855mdaily  #HLD: -Continue crestor 4063maily  #DMII: -Management per PCP      {Are you ordering a CV Procedure (e.g. stress test, cath, DCCV, TEE, etc)?   Press F2        :[id number]} Medication Adjustments/Labs and Tests Ordered: Current medicines are reviewed at length with the patient today.  Concerns regarding medicines are outlined above.  No orders of the defined types were placed in this encounter.  No orders of the defined types were placed in this encounter.   There are no Patient Instructions on file for this visit.   Signed, HeaFreada BergeronD  07/22/2022 8:10 PM    [place]

## 2022-08-02 ENCOUNTER — Ambulatory Visit: Payer: Medicare Other | Admitting: Cardiology

## 2022-11-09 ENCOUNTER — Emergency Department (HOSPITAL_COMMUNITY): Payer: Medicare Other

## 2022-11-09 ENCOUNTER — Observation Stay (HOSPITAL_COMMUNITY)
Admission: EM | Admit: 2022-11-09 | Discharge: 2022-11-10 | Disposition: A | Discharge status: Skilled Nursing Facility | Payer: Medicare Other | Attending: Internal Medicine | Admitting: Internal Medicine

## 2022-11-09 DIAGNOSIS — J449 Chronic obstructive pulmonary disease, unspecified: Secondary | ICD-10-CM | POA: Diagnosis not present

## 2022-11-09 DIAGNOSIS — Z1211 Encounter for screening for malignant neoplasm of colon: Secondary | ICD-10-CM | POA: Insufficient documentation

## 2022-11-09 DIAGNOSIS — I130 Hypertensive heart and chronic kidney disease with heart failure and stage 1 through stage 4 chronic kidney disease, or unspecified chronic kidney disease: Secondary | ICD-10-CM | POA: Insufficient documentation

## 2022-11-09 DIAGNOSIS — E1122 Type 2 diabetes mellitus with diabetic chronic kidney disease: Secondary | ICD-10-CM | POA: Insufficient documentation

## 2022-11-09 DIAGNOSIS — Z8673 Personal history of transient ischemic attack (TIA), and cerebral infarction without residual deficits: Secondary | ICD-10-CM | POA: Diagnosis not present

## 2022-11-09 DIAGNOSIS — J9601 Acute respiratory failure with hypoxia: Secondary | ICD-10-CM | POA: Insufficient documentation

## 2022-11-09 DIAGNOSIS — F0390 Unspecified dementia without behavioral disturbance: Secondary | ICD-10-CM | POA: Insufficient documentation

## 2022-11-09 DIAGNOSIS — E1110 Type 2 diabetes mellitus with ketoacidosis without coma: Secondary | ICD-10-CM | POA: Diagnosis not present

## 2022-11-09 DIAGNOSIS — G9341 Metabolic encephalopathy: Secondary | ICD-10-CM | POA: Insufficient documentation

## 2022-11-09 DIAGNOSIS — F419 Anxiety disorder, unspecified: Secondary | ICD-10-CM | POA: Diagnosis not present

## 2022-11-09 DIAGNOSIS — I5032 Chronic diastolic (congestive) heart failure: Secondary | ICD-10-CM | POA: Insufficient documentation

## 2022-11-09 DIAGNOSIS — Z79899 Other long term (current) drug therapy: Secondary | ICD-10-CM | POA: Insufficient documentation

## 2022-11-09 DIAGNOSIS — J189 Pneumonia, unspecified organism: Principal | ICD-10-CM | POA: Insufficient documentation

## 2022-11-09 DIAGNOSIS — Z9889 Other specified postprocedural states: Secondary | ICD-10-CM | POA: Insufficient documentation

## 2022-11-09 DIAGNOSIS — Z1152 Encounter for screening for COVID-19: Secondary | ICD-10-CM | POA: Insufficient documentation

## 2022-11-09 DIAGNOSIS — F17210 Nicotine dependence, cigarettes, uncomplicated: Secondary | ICD-10-CM | POA: Insufficient documentation

## 2022-11-09 DIAGNOSIS — N189 Chronic kidney disease, unspecified: Secondary | ICD-10-CM | POA: Insufficient documentation

## 2022-11-09 DIAGNOSIS — Z794 Long term (current) use of insulin: Secondary | ICD-10-CM | POA: Insufficient documentation

## 2022-11-09 DIAGNOSIS — E1140 Type 2 diabetes mellitus with diabetic neuropathy, unspecified: Secondary | ICD-10-CM | POA: Diagnosis not present

## 2022-11-09 DIAGNOSIS — Z7982 Long term (current) use of aspirin: Secondary | ICD-10-CM | POA: Insufficient documentation

## 2022-11-09 DIAGNOSIS — R0689 Other abnormalities of breathing: Secondary | ICD-10-CM | POA: Diagnosis present

## 2022-11-09 DIAGNOSIS — E876 Hypokalemia: Secondary | ICD-10-CM | POA: Diagnosis not present

## 2022-11-09 DIAGNOSIS — E039 Hypothyroidism, unspecified: Secondary | ICD-10-CM | POA: Diagnosis not present

## 2022-11-09 DIAGNOSIS — F32A Depression, unspecified: Secondary | ICD-10-CM | POA: Insufficient documentation

## 2022-11-09 LAB — CBC WITH DIFFERENTIAL/PLATELET
Abs Immature Granulocytes: 0.04 10*3/uL (ref 0.00–0.07)
Basophils Absolute: 0 10*3/uL (ref 0.0–0.1)
Basophils Relative: 0 %
Eosinophils Absolute: 0 10*3/uL (ref 0.0–0.5)
Eosinophils Relative: 0 %
HCT: 34.5 % — ABNORMAL LOW (ref 36.0–46.0)
Hemoglobin: 11.4 g/dL — ABNORMAL LOW (ref 12.0–15.0)
Immature Granulocytes: 1 %
Lymphocytes Relative: 31 %
Lymphs Abs: 1.6 10*3/uL (ref 0.7–4.0)
MCH: 32.3 pg (ref 26.0–34.0)
MCHC: 33 g/dL (ref 30.0–36.0)
MCV: 97.7 fL (ref 80.0–100.0)
Monocytes Absolute: 0.6 10*3/uL (ref 0.1–1.0)
Monocytes Relative: 12 %
Neutro Abs: 2.9 10*3/uL (ref 1.7–7.7)
Neutrophils Relative %: 56 %
Platelets: 92 10*3/uL — ABNORMAL LOW (ref 150–400)
RBC: 3.53 MIL/uL — ABNORMAL LOW (ref 3.87–5.11)
RDW: 14.8 % (ref 11.5–15.5)
WBC: 5.1 10*3/uL (ref 4.0–10.5)
nRBC: 0 % (ref 0.0–0.2)

## 2022-11-09 LAB — COMPREHENSIVE METABOLIC PANEL
ALT: 13 U/L (ref 0–44)
AST: 19 U/L (ref 15–41)
Albumin: 2.3 g/dL — ABNORMAL LOW (ref 3.5–5.0)
Alkaline Phosphatase: 64 U/L (ref 38–126)
Anion gap: 9 (ref 5–15)
BUN: 15 mg/dL (ref 8–23)
CO2: 29 mmol/L (ref 22–32)
Calcium: 9.8 mg/dL (ref 8.9–10.3)
Chloride: 102 mmol/L (ref 98–111)
Creatinine, Ser: 0.85 mg/dL (ref 0.44–1.00)
GFR, Estimated: >60 mL/min (ref >60)
Glucose, Bld: 69 mg/dL — ABNORMAL LOW (ref 70–99)
Potassium: 3.1 mmol/L — ABNORMAL LOW (ref 3.5–5.1)
Sodium: 140 mmol/L (ref 135–145)
Total Bilirubin: 0.6 mg/dL (ref 0.3–1.2)
Total Protein: 4.9 g/dL — ABNORMAL LOW (ref 6.5–8.1)

## 2022-11-09 LAB — URINALYSIS, ROUTINE W REFLEX MICROSCOPIC
Bilirubin Urine: NEGATIVE
Glucose, UA: 50 mg/dL — AB
Hgb urine dipstick: NEGATIVE
Ketones, ur: 5 mg/dL — AB
Leukocytes,Ua: NEGATIVE
Nitrite: NEGATIVE
Protein, ur: 30 mg/dL — AB
Specific Gravity, Urine: 1.039 — ABNORMAL HIGH (ref 1.005–1.030)
pH: 5 (ref 5.0–8.0)

## 2022-11-09 LAB — VALPROIC ACID LEVEL: Valproic Acid Lvl: 101 ug/mL — ABNORMAL HIGH (ref 50.0–100.0)

## 2022-11-09 LAB — I-STAT VENOUS BLOOD GAS, ED
Acid-Base Excess: 10 mmol/L — ABNORMAL HIGH (ref 0.0–2.0)
Bicarbonate: 33.2 mmol/L — ABNORMAL HIGH (ref 20.0–28.0)
Calcium, Ion: 1.24 mmol/L (ref 1.15–1.40)
HCT: 32 % — ABNORMAL LOW (ref 36.0–46.0)
Hemoglobin: 10.9 g/dL — ABNORMAL LOW (ref 12.0–15.0)
O2 Saturation: 77 %
Potassium: 3.2 mmol/L — ABNORMAL LOW (ref 3.5–5.1)
Sodium: 144 mmol/L (ref 135–145)
TCO2: 34 mmol/L — ABNORMAL HIGH (ref 22–32)
pCO2, Ven: 38.9 mmHg — ABNORMAL LOW (ref 44–60)
pH, Ven: 7.54 — ABNORMAL HIGH (ref 7.25–7.43)
pO2, Ven: 37 mmHg (ref 32–45)

## 2022-11-09 LAB — I-STAT CHEM 8, ED
BUN: 17 mg/dL (ref 8–23)
Calcium, Ion: 1.17 mmol/L (ref 1.15–1.40)
Chloride: 100 mmol/L (ref 98–111)
Creatinine, Ser: 1 mg/dL (ref 0.44–1.00)
Glucose, Bld: 69 mg/dL — ABNORMAL LOW (ref 70–99)
HCT: 33 % — ABNORMAL LOW (ref 36.0–46.0)
Hemoglobin: 11.2 g/dL — ABNORMAL LOW (ref 12.0–15.0)
Potassium: 3.2 mmol/L — ABNORMAL LOW (ref 3.5–5.1)
Sodium: 142 mmol/L (ref 135–145)
TCO2: 31 mmol/L (ref 22–32)

## 2022-11-09 LAB — PROTIME-INR
INR: 1 (ref 0.8–1.2)
Prothrombin Time: 13.1 seconds (ref 11.4–15.2)

## 2022-11-09 LAB — TROPONIN I (HIGH SENSITIVITY)
Troponin I (High Sensitivity): 12 ng/L (ref <18)
Troponin I (High Sensitivity): 11 ng/L (ref <18)

## 2022-11-09 LAB — RESP PANEL BY RT-PCR (RSV, FLU A&B, COVID)  RVPGX2
Influenza A by PCR: NEGATIVE
Influenza B by PCR: NEGATIVE
Resp Syncytial Virus by PCR: NEGATIVE
SARS Coronavirus 2 by RT PCR: NEGATIVE

## 2022-11-09 LAB — MAGNESIUM: Magnesium: 1.2 mg/dL — ABNORMAL LOW (ref 1.7–2.4)

## 2022-11-09 LAB — CBG MONITORING, ED
Glucose-Capillary: 53 mg/dL — ABNORMAL LOW (ref 70–99)
Glucose-Capillary: 183 mg/dL — ABNORMAL HIGH (ref 70–99)

## 2022-11-09 LAB — TSH: TSH: 0.066 u[IU]/mL — ABNORMAL LOW (ref 0.350–4.500)

## 2022-11-09 LAB — LACTIC ACID, PLASMA
Lactic Acid, Venous: 1 mmol/L (ref 0.5–1.9)
Lactic Acid, Venous: 1 mmol/L (ref 0.5–1.9)

## 2022-11-09 LAB — T4, FREE: Free T4: 1.53 ng/dL — ABNORMAL HIGH (ref 0.61–1.12)

## 2022-11-09 LAB — AMMONIA: Ammonia: 18 umol/L (ref 9–35)

## 2022-11-09 MED ORDER — ASPIRIN 81 MG PO CHEW
81.0000 mg | CHEWABLE_TABLET | Freq: Every day | ORAL | Status: DC
  Administered 2022-11-10: 81 mg via ORAL
  Filled 2022-11-09: qty 1

## 2022-11-09 MED ORDER — DEXTROSE 50 % IV SOLN
1.0000 | Freq: Once | INTRAVENOUS | Status: AC
  Administered 2022-11-09: 50 mL via INTRAVENOUS
  Filled 2022-11-09: qty 50

## 2022-11-09 MED ORDER — IOHEXOL 350 MG/ML SOLN
65.0000 mL | Freq: Once | INTRAVENOUS | Status: AC | PRN
  Administered 2022-11-09: 65 mL via INTRAVENOUS

## 2022-11-09 MED ORDER — MAGNESIUM SULFATE 2 GM/50ML IV SOLN
2.0000 g | Freq: Once | INTRAVENOUS | Status: AC
  Administered 2022-11-09: 2 g via INTRAVENOUS
  Filled 2022-11-09: qty 50

## 2022-11-09 MED ORDER — SODIUM CHLORIDE 0.9 % IV SOLN
100.0000 mg | Freq: Two times a day (BID) | INTRAVENOUS | Status: DC
  Administered 2022-11-10: 100 mg via INTRAVENOUS
  Filled 2022-11-09: qty 100

## 2022-11-09 MED ORDER — DILTIAZEM HCL ER COATED BEADS 180 MG PO CP24
180.0000 mg | ORAL_CAPSULE | Freq: Every day | ORAL | Status: DC
  Administered 2022-11-10: 180 mg via ORAL
  Filled 2022-11-09: qty 1

## 2022-11-09 MED ORDER — LACTATED RINGERS IV BOLUS (SEPSIS)
1000.0000 mL | Freq: Once | INTRAVENOUS | Status: AC
  Administered 2022-11-09: 1000 mL via INTRAVENOUS

## 2022-11-09 MED ORDER — SODIUM CHLORIDE 0.9 % IV SOLN
500.0000 mg | Freq: Once | INTRAVENOUS | Status: AC
  Administered 2022-11-09: 500 mg via INTRAVENOUS
  Filled 2022-11-09: qty 5

## 2022-11-09 MED ORDER — SODIUM CHLORIDE 0.9 % IV SOLN
1.0000 g | Freq: Once | INTRAVENOUS | Status: AC
  Administered 2022-11-09: 1 g via INTRAVENOUS
  Filled 2022-11-09: qty 10

## 2022-11-09 MED ORDER — POTASSIUM CHLORIDE 10 MEQ/100ML IV SOLN
10.0000 meq | INTRAVENOUS | Status: AC
  Administered 2022-11-09 – 2022-11-10 (×5): 10 meq via INTRAVENOUS
  Filled 2022-11-09 (×4): qty 100

## 2022-11-09 NOTE — H&P (Addendum)
History and Physical    DELL HURTUBISE IOX:[id number] DOB: [birth year] DOA: 11/09/2022  PCP: Elmore Guise, MD  Patient coming from: [facility]   I have personally briefly reviewed patient's old medical records in [place]  Chief Complaint: Change in MS  HPI: QUANDRA FEDORCHAK is a 72 y.o. female with medical history significant of  hypertension, COPD, type 2 diabetes, chronic diastolic heart failure, hypothyroidism, CVA with left hemiparesis, history of atrial fibrillation ,ischemic colitis,  vascular dementia,hx of pericardial effusion with hemorrhagic conversion s/p pericardiotomy 10/2021, depression, anxiety, who presents to ED with change in [place] from NH. Patient unable to aide in history ,but per aide patient at baseline is coherent and conversant, however aide had not seen patient in a few days. AT this time onset of change mental state unclear.  Per notes in the field patient was hypoxic and was place don 2.5 L [place] and transported to ED.  ED Course:  Vitals  afeb, bp 143/67, hr 97 rr 16 sat89% -100% on [place] EKG nsr  Resp panel neg , small q in inferior lead Wbc 5.1, K 11.4, plt 92,  Gluc 53,183 Na 140, K 3.1 , gly 69 gl102 Ce 12 Vbg: 05/12/37.9 Mag 1.2 TSDH 0.6 Valproic acid 101  Ammonia 18 , lactic 1 UA neg CTH Chronic atrophic and ischemic changes.  No acute abnormality noted.  CTA No evidence of pulmonary emboli.   Patchy right upper lobe infiltrate consistent with acute pneumonia.   No other focal abnormality is noted.  Tx dextrose1 amp , 1LR, mag 2g, K 10 meg, ctx /azthro  Review of Systems: As per HPI otherwise 10 point review of systems negative.   Past Medical History:  Diagnosis Date   Arthritis    COPD (chronic obstructive pulmonary disease) ([place])    Diverticulosis    Essential hypertension, benign    Hx of colonic polyps    Hypothyroidism    IBS (irritable bowel syndrome)    Ketoacidosis    Mixed hyperlipidemia    PTSD (post-traumatic stress  disorder)    S/P colonoscopy 11/2009   RMR: pancolonic diverticula, polypoid rectal mucosa with  prominent lymphoid aggregates, repeat in Jan 2016    Stress incontinence, female    Tobacco use    Type 2 diabetes mellitus ([place])     Past Surgical History:  Procedure Laterality Date   Bilateral tubal ligation     BREAST BIOPSY Left 10/16/2014   negative   CHOLECYSTECTOMY  09/11/2012   Procedure: LAPAROSCOPIC CHOLECYSTECTOMY;  Surgeon: Jamesetta So, MD;  Location: [facility];  Service: General;  Laterality: N/A;   COLONOSCOPY  11/17/2009   Dr. Gala Romney: normal rectum, pancolonic diverticula, polyps benign, no adenomas.    COLONOSCOPY N/A 02/12/2015   Dr. Gala Romney: colonic diverticulosis, multiple colonic polyps (tubular adenomas), negative segmental biopsies. Surveillance in 2021   ESOPHAGOGASTRODUODENOSCOPY  11/23/2011   Dr. Gala Romney: Erosive reflux esophagitis; small hiatal hernia; esophagus dilated empirically   MALONEY DILATION  11/23/2011   Procedure: Venia Minks DILATION;  Surgeon: Daneil Dolin, MD;  Location: [facility] SUITE;  Service: Endoscopy;  Laterality: N/A;   PERICARDIOCENTESIS N/A 11/06/2021   Procedure: PERICARDIOCENTESIS;  Surgeon: Jettie Booze, MD;  Location: [facility];  Service: Cardiovascular;  Laterality: N/A;   SKIN LESION EXCISION     on buttocks   TUBAL LIGATION       reports that she has been smoking cigarettes. She has a 86.00 pack-year smoking history. She  has never used smokeless tobacco. She reports that she does not drink alcohol and does not use drugs.  No Known Allergies  Family History  Problem Relation Age of Onset   Cancer Mother        unsure what type   Heart disease Father    Heart attack Father    Schizophrenia Brother    Stroke Brother    Cancer Son        bladder   Coronary artery disease Other    Diabetes type II Other    Colon cancer Neg Hx    Breast cancer Neg Hx     Prior to Admission medications   Medication Sig Start Date End  Date Taking? Authorizing Provider  acetaminophen (TYLENOL) 325 MG tablet Take 650 mg by mouth every 6 (six) hours as needed.    [provider]  albuterol (VENTOLIN HFA) 108 (90 Base) MCG/ACT inhaler Inhale 2 puffs into the lungs every 4 (four) hours as needed for wheezing or shortness of breath. 12/03/20   Manuella Ghazi, Pratik D, DO  allopurinol (ZYLOPRIM) 100 MG tablet Take 50 mg by mouth daily. Idiopathic gout    [provider]  aspirin 81 MG chewable tablet Chew 81 mg by mouth daily.    [provider]  bisacodyl (DULCOLAX) 10 MG suppository Place 10 mg rectally daily as needed (constipation not relieved by MOM).    [provider]  Calcium Carb-Cholecalciferol (OYSTER SHELL CALCIUM W/D) 500-200 MG-UNIT TABS Take 1 tablet by mouth 2 (two) times daily. 11/10/20   [provider]  colchicine 0.6 MG tablet Take 1 tablet (0.6 mg total) by mouth daily. 11/11/21 11/08/48  Sharen Hones, MD  colestipol (COLESTID) 1 g tablet TAKE 2 TABLETS DAILY FOR DIARRHEA. DO NOT TAKE WITHIN 2 HOURS OF OTHER ORAL MEDICATIONS. HOLD FOR CONSTIPATION. 05/23/20   Carlis Stable, NP  diltiazem (DILACOR XR) 180 MG 24 hr capsule Take 180 mg by mouth daily.    [provider]  divalproex (DEPAKOTE ER) 500 MG 24 hr tablet Take 1,500 mg by mouth at bedtime. FOR MOOD/BEHAVIORS    [provider]  feeding supplement, GLUCERNA SHAKE, (GLUCERNA SHAKE) LIQD Take 237 mLs by mouth daily.    [provider]  furosemide (LASIX) 20 MG tablet Take 1 tablet (20 mg total) by mouth every other day. 09/07/21   Johnson, Clanford L, MD  insulin aspart (NOVOLOG) 100 UNIT/ML injection Inject 5 Units into the skin 3 (three) times daily. For CBG >200    [provider]  insulin detemir (LEVEMIR) 100 UNIT/ML FlexPen Inject 8 Units into the skin 2 (two) times daily.    [provider]  levothyroxine (SYNTHROID) 150 MCG tablet Take 150 mcg by mouth daily before breakfast.     [provider]  magnesium hydroxide (MILK OF MAGNESIA) 400 MG/5ML suspension Take 30 mLs by mouth daily as needed (if no BM in 3 days).    [provider]  memantine (NAMENDA) 10 MG tablet Take 10 mg by mouth 2 (two) times daily.    [provider]  mirtazapine (REMERON) 15 MG tablet Take 15 mg by mouth at bedtime.    [provider]  Nutritional Supplements (NUTRITIONAL SUPPLEMENT PO) Take 1 each by mouth daily. Magic Cup    [provider]  ondansetron (ZOFRAN) 4 MG tablet Take 1 tablet (4 mg total) by mouth every 8 (eight) hours as needed for nausea or vomiting. 10/17/18   Carlis Stable,  NP  pantoprazole (PROTONIX) 40 MG tablet TAKE 1 TABLET BY MOUTH ONCE DAILY 30 MINTUES BEFORE BREAKFAST. 05/23/20   Carlis Stable, NP  PARoxetine (PAXIL) 40 MG tablet Take 1 tablet (40 mg total) by mouth daily. 02/20/21   Norman Clay, MD  QUETIAPINE FUMARATE ER PO Take 200 mg by mouth at bedtime.    [provider]  rosuvastatin (CRESTOR) 40 MG tablet Take 1 tablet (40 mg total) by mouth daily. 01/12/22   Elgie Collard, PA-C  senna-docusate (SENOKOT-S) 8.6-50 MG tablet Take 1 tablet by mouth at bedtime as needed for mild constipation. 09/04/21   Johnson, Clanford L, MD  Sodium Phosphates (RA SALINE ENEMA RE) Place 1 Dose rectally once as needed (severe constipation). If no relief from both milk of magnesia, and Bisacodyl suppository, administer one dose of disposable saline enema.    [provider]  vitamin B-12 (CYANOCOBALAMIN) 500 MCG tablet Take 500 mcg by mouth daily.    [provider]    Physical Exam: Vitals:   11/09/22 2015 11/09/22 2030 11/09/22 2045 11/09/22 2100  BP: (!) 146/56 (!) 143/64 (!) 137/59 (!) 151/58  Pulse: 95 92 95 95  Resp: '18 16 16 18  '$ Temp:      TempSrc:      SpO2: 100% 100% 90%     Constitutional: NAD, calm, comfortable Vitals:   11/09/22 2015 11/09/22 2030 11/09/22 2045 11/09/22 2100  BP: (!) 146/56 (!)  143/64 (!) 137/59 (!) 151/58  Pulse: 95 92 95 95  Resp: '18 16 16 18  '$ Temp:      TempSrc:      SpO2: 100% 100% 90%    Eyes: PERRL, lids and conjunctivae normal ENMT: Mucous membranes are moist. Posterior pharynx clear of any exudate or lesions.Normal dentition.  Neck: normal, supple, no masses, no thyromegaly Respiratory: clear to auscultation bilaterally, no wheezing, no crackles. Normal respiratory effort. No accessory muscle use.  Cardiovascular: Regular rate and rhythm, no murmurs / rubs / gallops. No extremity edema. 2+ pedal pulses. No carotid bruits.  Abdomen: no tenderness, no masses palpated. No hepatosplenomegaly. Bowel sounds positive.  Musculoskeletal: no clubbing / cyanosis. No joint deformity upper and lower extremities. Good ROM, no contractures. Normal muscle tone.  Skin: no rashes, lesions, ulcers. No induration Neurologic: CN 2-12 grossly intact. Sensation intact, DTR normal. Strength 5/5 in all 4.  Psychiatric: Normal judgment and insight. Alert and oriented x 3. Normal mood.    Labs on Admission: I have personally reviewed following labs and imaging studies  CBC: Recent Labs  Lab 11/09/22 1927 11/09/22 1943 11/09/22 1944  WBC 5.1  --   --   NEUTROABS 2.9  --   --   HGB 11.4* 10.9* 11.2*  HCT 34.5* 32.0* 33.0*  MCV 97.7  --   --   PLT 92*  --   --    Basic Metabolic Panel: Recent Labs  Lab 11/09/22 1927 11/09/22 1943 11/09/22 1944  NA 140 144 142  K 3.1* 3.2* 3.2*  CL 102  --  100  CO2 29  --   --   GLUCOSE 69*  --  69*  BUN 15  --  17  CREATININE 0.85  --  1.00  CALCIUM 9.8  --   --   MG 1.2*  --   --    GFR: CrCl cannot be calculated (Unknown ideal weight.). Liver Function Tests: Recent Labs  Lab 11/09/22 1927  AST 19  ALT 13  ALKPHOS 64  BILITOT 0.6  PROT 4.9*  ALBUMIN 2.3*   No results for input(s): "LIPASE", "AMYLASE" in the last 168 hours. Recent Labs  Lab 11/09/22 1924  AMMONIA 18   Coagulation Profile: Recent Labs  Lab  11/09/22 1927  INR 1.0   Cardiac Enzymes: No results for input(s): "CKTOTAL", "CKMB", "CKMBINDEX", "TROPONINI" in the last 168 hours. BNP (last 3 results) No results for input(s): "PROBNP" in the last 8760 hours. HbA1C: No results for input(s): "HGBA1C" in the last 72 hours. CBG: Recent Labs  Lab 11/09/22 2001 11/09/22 2039  GLUCAP 53* 183*   Lipid Profile: No results for input(s): "CHOL", "HDL", "LDLCALC", "TRIG", "CHOLHDL", "LDLDIRECT" in the last 72 hours. Thyroid Function Tests: Recent Labs    11/09/22 2000  TSH 0.066*   Anemia Panel: No results for input(s): "VITAMINB12", "FOLATE", "FERRITIN", "TIBC", "IRON", "RETICCTPCT" in the last 72 hours. Urine analysis:    Component Value Date/Time   COLORURINE YELLOW 11/09/2022 2238   APPEARANCEUR HAZY (A) 11/09/2022 2238   LABSPEC 1.039 (H) 11/09/2022 2238   PHURINE 5.0 11/09/2022 2238   GLUCOSEU 50 (A) 11/09/2022 2238   HGBUR NEGATIVE 11/09/2022 2238   BILIRUBINUR NEGATIVE 11/09/2022 2238   KETONESUR 5 (A) 11/09/2022 2238   PROTEINUR 30 (A) 11/09/2022 2238   UROBILINOGEN 1.0 01/10/2015 1754   NITRITE NEGATIVE 11/09/2022 2238   LEUKOCYTESUR NEGATIVE 11/09/2022 2238    Radiological Exams on Admission: CT Angio Chest PE W and/or Wo Contrast  Result Date: 11/09/2022 CLINICAL DATA:  Altered mental status and difficulty breathing EXAM: CT ANGIOGRAPHY CHEST WITH CONTRAST TECHNIQUE: Multidetector CT imaging of the chest was performed using the standard protocol during bolus administration of intravenous contrast. Multiplanar CT image reconstructions and MIPs were obtained to evaluate the vascular anatomy. RADIATION DOSE REDUCTION: This exam was performed according to the departmental dose-optimization program which includes automated exposure control, adjustment of the mA and/or kV according to patient size and/or use of iterative reconstruction technique. CONTRAST:  55m OMNIPAQUE IOHEXOL 350 MG/ML SOLN COMPARISON:  Chest x-ray  from earlier in the same day. FINDINGS: Cardiovascular: Atherosclerotic calcifications of the aorta are noted. No aneurysmal dilatation is seen. No dissection is noted. No cardiac enlargement is seen. Coronary calcifications are noted. The pulmonary artery shows a normal branching pattern. No filling defect to suggest pulmonary embolism is noted. Mediastinum/Nodes: Thoracic inlet is within normal limits. No hilar or mediastinal adenopathy is noted. The esophagus as visualized is within normal limits. Lungs/Pleura: Lungs are well aerated bilaterally. No sizable parenchymal nodule is seen. Patchy infiltrate is noted in the right upper lobe consistent with acute pneumonia. No sizable effusion is seen. Upper Abdomen: Visualized upper abdomen demonstrates changes of prior cholecystectomy. No other focal abnormality is noted. Musculoskeletal: Degenerative changes of the thoracic spine are noted. No acute rib abnormality is noted. Review of the MIP images confirms the above findings. IMPRESSION: No evidence of pulmonary emboli. Patchy right upper lobe infiltrate consistent with acute pneumonia. No other focal abnormality is noted. Aortic Atherosclerosis (ICD10-I70.0). Electronically Signed   By: MInez CatalinaM.D.   On: 11/09/2022 21:51   CT Head Wo Contrast  Result Date: 11/09/2022 CLINICAL DATA:  Altered mental status EXAM: CT HEAD WITHOUT CONTRAST TECHNIQUE: Contiguous axial images were obtained from the base of the skull through the vertex without intravenous contrast. RADIATION DOSE REDUCTION: This exam was performed according to the departmental dose-optimization program which includes automated exposure control, adjustment of the mA and/or kV according to patient size and/or use of iterative reconstruction  technique. COMPARISON:  12/02/2021 FINDINGS: Brain: No evidence of acute infarction, hemorrhage, hydrocephalus, extra-axial collection or mass lesion/mass effect. Prior lacunar infarct in the right basal ganglia  is again identified and stable. Chronic white matter ischemic changes are noted as well as mild atrophic changes. Vascular: No hyperdense vessel or unexpected calcification. Skull: Normal. Negative for fracture or focal lesion. Sinuses/Orbits: No acute finding. Other: None. IMPRESSION: Chronic atrophic and ischemic changes.  No acute abnormality noted. Electronically Signed   By: Inez Catalina M.D.   On: 11/09/2022 21:48   DG Chest Port 1 View  Result Date: 11/09/2022 CLINICAL DATA:  Altered mental status. EXAM: PORTABLE CHEST 1 VIEW COMPARISON:  December 02, 2021 FINDINGS: The heart size and mediastinal contours are within normal limits. There is moderate severity calcification of the aortic arch. Both lungs are clear. Radiopaque surgical clips are seen within the right upper quadrant. The visualized skeletal structures are unremarkable. IMPRESSION: No active cardiopulmonary disease. Electronically Signed   By: Virgina Norfolk M.D.   On: 11/09/2022 20:02    EKG: Independently reviewed. See above  Assessment/Plan    CAP presumed bacterial  -  Opacities on imaging/cough /change in ms -ctx/ azithromycin, de-escalate as able  -pulmonary toilet  -urine ag, sputum, f/u on culture data    DMII Hypoglycemia -monitor fs q4h  -last check 183 low 56 -hold out patient meds currently resume as able   Acute metabolic  Encephalopathy -due to acute infection / hypoglycemia -treat underlying cause  -CTH NAD  Hypertension -resume home regimen  -cardizem    COPD -no acute exacerbation  -prn albuterol   Chronic diastolic heart failure -no acute exacerbation  -monitor I/o /daily wt   Hypothyroidism -low TSH -expansion pending -resume supplements based on f/u labs   Hx afib  CVA with left hemiparesis -  Vascular dementia -no acute issues    hx of pericardial effusion  -with hemorrhagic conversion  -s/p pericardiotomy 10/2021  Depression Anxiety -resume home  regimen  HLD -continue statin  DVT prophylaxis:  Code Status: full Family Communication: none at bedside Disposition Plan: patient  expected to be admitted greater than 2 midnights  Consults called: none Admission status: med tele   Clance Boll MD [facility]   If 7PM-7AM, please contact night-coverage [email or web address] Password [facility]  11/09/2022, 11:24 PM

## 2022-11-09 NOTE — ED Triage Notes (Signed)
Pt BIB [facility] from [facility] for AMS. Per staff pt typically able to have conversation and AO4. Pt was treated for UTI 2 weeks ago and staff typically has to straight cath 2x/week. Pt was 86% RA per staff, placed on 2L [place] with improvement to 96%. Full code

## 2022-11-09 NOTE — ED Provider Notes (Signed)
[facility] EMERGENCY DEPARTMENT Provider Note   CSN: [id number] Arrival date & time: 11/09/22  1908     History  Chief Complaint  Patient presents with   Altered Mental Status    Claudia Keller is a 72 y.o. female.  HPI Patient presents for lethargy.  Medical history includes IBS, T2DM, HTN, ischemic colitis, COPD, GERD, neuropathy, depression, anxiety, CVA, dementia, CKD, gout.  She presents via EMS from [place].  Last known well is unknown.  They report that EMS got was that the staff member at facility had lasting her normal on Thursday (5 days ago).  Today, patient has been lethargic.  At baseline, she is reportedly conversant, alert, and oriented.  She is not on oxygen at baseline but was hypoxic on room air.  She was placed on 2.5 L of supplemental oxygen.  EMS noted a wet cough.  EMS reports otherwise normal vital signs and CBG in the range of 120.  Patient, herself, denies any areas of discomfort at this time.    Home Medications Prior to Admission medications   Medication Sig Start Date End Date Taking? Authorizing Provider  acetaminophen (TYLENOL) 325 MG tablet Take 650 mg by mouth every 6 (six) hours as needed.    [provider]  albuterol (VENTOLIN HFA) 108 (90 Base) MCG/ACT inhaler Inhale 2 puffs into the lungs every 4 (four) hours as needed for wheezing or shortness of breath. 12/03/20   Manuella Ghazi, Pratik D, DO  allopurinol (ZYLOPRIM) 100 MG tablet Take 50 mg by mouth daily. Idiopathic gout    [provider]  aspirin 81 MG chewable tablet Chew 81 mg by mouth daily.    [provider]  bisacodyl (DULCOLAX) 10 MG suppository Place 10 mg rectally daily as needed (constipation not relieved by MOM).    [provider]  Calcium Carb-Cholecalciferol (OYSTER SHELL CALCIUM W/D) 500-200 MG-UNIT TABS Take 1 tablet by mouth 2 (two) times daily. 11/10/20   [provider]  colchicine 0.6 MG tablet Take 1 tablet  (0.6 mg total) by mouth daily. 11/11/21 11/08/48  Sharen Hones, MD  colestipol (COLESTID) 1 g tablet TAKE 2 TABLETS DAILY FOR DIARRHEA. DO NOT TAKE WITHIN 2 HOURS OF OTHER ORAL MEDICATIONS. HOLD FOR CONSTIPATION. 05/23/20   Carlis Stable, NP  diltiazem (DILACOR XR) 180 MG 24 hr capsule Take 180 mg by mouth daily.    [provider]  divalproex (DEPAKOTE ER) 500 MG 24 hr tablet Take 1,500 mg by mouth at bedtime. FOR MOOD/BEHAVIORS    [provider]  feeding supplement, GLUCERNA SHAKE, (GLUCERNA SHAKE) LIQD Take 237 mLs by mouth daily.    [provider]  furosemide (LASIX) 20 MG tablet Take 1 tablet (20 mg total) by mouth every other day. 09/07/21   Johnson, Clanford L, MD  insulin aspart (NOVOLOG) 100 UNIT/ML injection Inject 5 Units into the skin 3 (three) times daily. For CBG >200    [provider]  insulin detemir (LEVEMIR) 100 UNIT/ML FlexPen Inject 8 Units into the skin 2 (two) times daily.    [provider]  levothyroxine (SYNTHROID) 150 MCG tablet Take 150 mcg by mouth daily before breakfast.    [provider]  magnesium hydroxide (MILK OF MAGNESIA) 400 MG/5ML suspension Take 30 mLs by mouth daily as needed (if no BM in 3 days).    [provider]  memantine (NAMENDA) 10 MG tablet Take 10 mg by mouth 2 (two) times daily.  [provider]  mirtazapine (REMERON) 15 MG tablet Take 15 mg by mouth at bedtime.    [provider]  Nutritional Supplements (NUTRITIONAL SUPPLEMENT PO) Take 1 each by mouth daily. Social research officer, government, Historical, MD  ondansetron (ZOFRAN) 4 MG tablet Take 1 tablet (4 mg total) by mouth every 8 (eight) hours as needed for nausea or vomiting. 10/17/18   Carlis Stable, NP  pantoprazole (PROTONIX) 40 MG tablet TAKE 1 TABLET BY MOUTH ONCE DAILY 30 MINTUES BEFORE BREAKFAST. 05/23/20   Carlis Stable, NP  PARoxetine (PAXIL) 40 MG tablet Take 1 tablet (40 mg total) by mouth daily. 02/20/21   Norman Clay, MD  QUETIAPINE FUMARATE ER PO Take 200 mg by mouth at bedtime.    [provider]  rosuvastatin (CRESTOR) 40 MG tablet Take 1 tablet (40 mg total) by mouth daily. 01/12/22   Elgie Collard, PA-C  senna-docusate (SENOKOT-S) 8.6-50 MG tablet Take 1 tablet by mouth at bedtime as needed for mild constipation. 09/04/21   Johnson, Clanford L, MD  Sodium Phosphates (RA SALINE ENEMA RE) Place 1 Dose rectally once as needed (severe constipation). If no relief from both milk of magnesia, and Bisacodyl suppository, administer one dose of disposable saline enema.    [provider]  vitamin B-12 (CYANOCOBALAMIN) 500 MCG tablet Take 500 mcg by mouth daily.    [provider]      Allergies    Patient has no known allergies.    Review of Systems   Review of Systems  Unable to perform ROS: Mental status change    Physical Exam Updated Vital Signs BP (!) 151/58   Pulse 95   Temp 98 F (36.7 C) (Oral)   Resp 18   SpO2 90%  Physical Exam Vitals and nursing note reviewed.  Constitutional:      General: She is not in acute distress.    Appearance: She is well-developed and normal weight. She is ill-appearing. She is not toxic-appearing or diaphoretic.  HENT:     Head: Normocephalic and atraumatic.     Right Ear: External ear normal.     Left Ear: External ear normal.     Nose: Nose normal.     Mouth/Throat:     Mouth: Mucous membranes are dry.  Eyes:     Extraocular Movements: Extraocular movements intact.     Conjunctiva/sclera: Conjunctivae normal.  Cardiovascular:     Rate and Rhythm: Normal rate and regular rhythm.     Heart sounds: No murmur heard. Pulmonary:     Effort: Pulmonary effort is normal. No tachypnea, accessory muscle usage or respiratory distress.     Breath sounds: Decreased breath sounds present. No wheezing.  Abdominal:     General: There is no distension.     Palpations: Abdomen is soft.     Tenderness: There is no abdominal tenderness.   Musculoskeletal:        General: No swelling.     Cervical back: Normal range of motion and neck supple. No rigidity.     Right lower leg: No edema.     Left lower leg: No edema.  Skin:    General: Skin is warm and dry.     Capillary Refill: Capillary refill takes less than 2 seconds.     Coloration: Skin is pale. Skin is not jaundiced.  Neurological:     General: No focal deficit present.     Mental Status: She is disoriented.  GCS: GCS eye subscore is 3. GCS verbal subscore is 4. GCS motor subscore is 6.     Motor: No weakness.     ED Results / Procedures / Treatments   Labs (all labs ordered are listed, but only abnormal results are displayed) Labs Reviewed  COMPREHENSIVE METABOLIC PANEL - Abnormal; Notable for the following components:      Result Value   Potassium 3.1 (*)    Glucose, Bld 69 (*)    Total Protein 4.9 (*)    Albumin 2.3 (*)    All other components within normal limits  CBC WITH DIFFERENTIAL/PLATELET - Abnormal; Notable for the following components:   RBC 3.53 (*)    Hemoglobin 11.4 (*)    HCT 34.5 (*)    Platelets 92 (*)    All other components within normal limits  MAGNESIUM - Abnormal; Notable for the following components:   Magnesium 1.2 (*)    All other components within normal limits  VALPROIC ACID LEVEL - Abnormal; Notable for the following components:   Valproic Acid Lvl 101 (*)    All other components within normal limits  TSH - Abnormal; Notable for the following components:   TSH 0.066 (*)    All other components within normal limits  I-STAT CHEM 8, ED - Abnormal; Notable for the following components:   Potassium 3.2 (*)    Glucose, Bld 69 (*)    Hemoglobin 11.2 (*)    HCT 33.0 (*)    All other components within normal limits  I-STAT VENOUS BLOOD GAS, ED - Abnormal; Notable for the following components:   pH, Ven 7.540 (*)    pCO2, Ven 38.9 (*)    Bicarbonate 33.2 (*)    TCO2 34 (*)    Acid-Base Excess 10.0 (*)    Potassium 3.2  (*)    HCT 32.0 (*)    Hemoglobin 10.9 (*)    All other components within normal limits  CBG MONITORING, ED - Abnormal; Notable for the following components:   Glucose-Capillary 53 (*)    All other components within normal limits  CBG MONITORING, ED - Abnormal; Notable for the following components:   Glucose-Capillary 183 (*)    All other components within normal limits  RESP PANEL BY RT-PCR (RSV, FLU A&B, COVID)  [id number]  URINE CULTURE  CULTURE, BLOOD (SINGLE)  LACTIC ACID, PLASMA  PROTIME-INR  AMMONIA  LACTIC ACID, PLASMA  URINALYSIS, ROUTINE W REFLEX MICROSCOPIC  T4, FREE  T3, FREE  TROPONIN I (HIGH SENSITIVITY)  TROPONIN I (HIGH SENSITIVITY)    EKG EKG Interpretation  Date/Time:  Tuesday November 09 2022 19:26:35 EST Ventricular Rate:  97 PR Interval:  128 QRS Duration: 76 QT Interval:  321 QTC Calculation: 408 R Axis:   40 Text Interpretation: Sinus rhythm Probable inferior infarct, age indeterminate Lateral leads are also involved Confirmed by Godfrey Pick [phone]) on 11/09/2022 7:36:20 PM  Radiology CT Angio Chest PE W and/or Wo Contrast  Result Date: 11/09/2022 CLINICAL DATA:  Altered mental status and difficulty breathing EXAM: CT ANGIOGRAPHY CHEST WITH CONTRAST TECHNIQUE: Multidetector CT imaging of the chest was performed using the standard protocol during bolus administration of intravenous contrast. Multiplanar CT image reconstructions and MIPs were obtained to evaluate the vascular anatomy. RADIATION DOSE REDUCTION: This exam was performed according to the departmental dose-optimization program which includes automated exposure control, adjustment of the mA and/or kV according to patient size and/or use of iterative reconstruction technique. CONTRAST:  31m OMNIPAQUE IOHEXOL 350 MG/ML  SOLN COMPARISON:  Chest x-ray from earlier in the same day. FINDINGS: Cardiovascular: Atherosclerotic calcifications of the aorta are noted. No aneurysmal dilatation is seen. No dissection is  noted. No cardiac enlargement is seen. Coronary calcifications are noted. The pulmonary artery shows a normal branching pattern. No filling defect to suggest pulmonary embolism is noted. Mediastinum/Nodes: Thoracic inlet is within normal limits. No hilar or mediastinal adenopathy is noted. The esophagus as visualized is within normal limits. Lungs/Pleura: Lungs are well aerated bilaterally. No sizable parenchymal nodule is seen. Patchy infiltrate is noted in the right upper lobe consistent with acute pneumonia. No sizable effusion is seen. Upper Abdomen: Visualized upper abdomen demonstrates changes of prior cholecystectomy. No other focal abnormality is noted. Musculoskeletal: Degenerative changes of the thoracic spine are noted. No acute rib abnormality is noted. Review of the MIP images confirms the above findings. IMPRESSION: No evidence of pulmonary emboli. Patchy right upper lobe infiltrate consistent with acute pneumonia. No other focal abnormality is noted. Aortic Atherosclerosis (ICD10-I70.0). Electronically Signed   By: Inez Catalina M.D.   On: 11/09/2022 21:51   CT Head Wo Contrast  Result Date: 11/09/2022 CLINICAL DATA:  Altered mental status EXAM: CT HEAD WITHOUT CONTRAST TECHNIQUE: Contiguous axial images were obtained from the base of the skull through the vertex without intravenous contrast. RADIATION DOSE REDUCTION: This exam was performed according to the departmental dose-optimization program which includes automated exposure control, adjustment of the mA and/or kV according to patient size and/or use of iterative reconstruction technique. COMPARISON:  12/02/2021 FINDINGS: Brain: No evidence of acute infarction, hemorrhage, hydrocephalus, extra-axial collection or mass lesion/mass effect. Prior lacunar infarct in the right basal ganglia is again identified and stable. Chronic white matter ischemic changes are noted as well as mild atrophic changes. Vascular: No hyperdense vessel or unexpected  calcification. Skull: Normal. Negative for fracture or focal lesion. Sinuses/Orbits: No acute finding. Other: None. IMPRESSION: Chronic atrophic and ischemic changes.  No acute abnormality noted. Electronically Signed   By: Inez Catalina M.D.   On: 11/09/2022 21:48   DG Chest Port 1 View  Result Date: 11/09/2022 CLINICAL DATA:  Altered mental status. EXAM: PORTABLE CHEST 1 VIEW COMPARISON:  December 02, 2021 FINDINGS: The heart size and mediastinal contours are within normal limits. There is moderate severity calcification of the aortic arch. Both lungs are clear. Radiopaque surgical clips are seen within the right upper quadrant. The visualized skeletal structures are unremarkable. IMPRESSION: No active cardiopulmonary disease. Electronically Signed   By: Virgina Norfolk M.D.   On: 11/09/2022 20:02    Procedures Procedures    Medications Ordered in ED Medications  magnesium sulfate IVPB 2 g 50 mL (2 g Intravenous New Bag/Given 11/09/22 2230)  potassium chloride 10 mEq in 100 mL IVPB (10 mEq Intravenous New Bag/Given 11/09/22 2229)  cefTRIAXone (ROCEPHIN) 1 g in sodium chloride 0.9 % 100 mL IVPB (1 g Intravenous New Bag/Given 11/09/22 2227)  azithromycin (ZITHROMAX) 500 mg in sodium chloride 0.9 % 250 mL IVPB (500 mg Intravenous New Bag/Given 11/09/22 2232)  lactated ringers bolus 1,000 mL (0 mLs Intravenous Stopped 11/09/22 2216)  dextrose 50 % solution 50 mL (50 mLs Intravenous Given 11/09/22 2004)  iohexol (OMNIPAQUE) 350 MG/ML injection 65 mL (65 mLs Intravenous Contrast Given 11/09/22 2136)    ED Course/ Medical Decision Making/ A&P                           Medical Decision Making Amount and/or  Complexity of Data Reviewed Labs: ordered. Radiology: ordered. ECG/medicine tests: ordered.  Risk Prescription drug management.   This patient presents to the ED for concern of altered mental status, this involves an extensive number of treatment options, and is a complaint that carries with it a high  risk of complications and morbidity.  The differential diagnosis includes infection, polypharmacy, dehydration, CVA, ICH, seizure, metabolic derangements   Co morbidities that complicate the patient evaluation  IBS, T2DM, HTN, ischemic colitis, COPD, GERD, neuropathy, depression, anxiety, CVA, dementia, CKD, gout   Additional history obtained:  Additional history obtained from EMS External records from outside source obtained and reviewed including EMR   Lab Tests:  I Ordered, and personally interpreted labs.  The pertinent results include: Hypokalemia, hypomagnesemia, and hypoglycemia.  Anemia is baseline.  No leukocytosis is present.  There is a new thrombocytopenia.  Imaging Studies ordered:  I ordered imaging studies including chest x-ray, CT head, CTA chest I independently visualized and interpreted imaging which showed no acute findings on CT head.  CTA did reveal right upper lobe pneumonia I agree with the radiologist interpretation   Cardiac Monitoring: / EKG:  The patient was maintained on a cardiac monitor.  I personally viewed and interpreted the cardiac monitored which showed an underlying rhythm of: Sinus rhythm  Problem List / ED Course / Critical interventions / Medication management  Patient presents from skilled nursing facility for lethargy and altered mental status.  She is reportedly conversant and fully alert and oriented at baseline.  She does have documented dementia.  She is lethargic on arrival.  GCS is 13.  She is able to follow commands.  She is moving all extremities.  She is disoriented.  Patient was noted to be hypoxic on room air with EMS.  She was placed on supplemental oxygen.  They noted a wet cough during transit but this was not present during my exam.  She does have diminished breath sounds.  Patient remained on 2.5 L of supplemental oxygen.  He was placed on bedside cardiac monitor.  Heart rate and blood pressure are normal at this time.   Diagnostic workup was initiated.  Although CBG was normal with EMS, CBG in the ED was 53.  D50 was ordered.  Although no findings were identified on chest x-ray, patient did undergo a CTA chest which did show right upper lobe pneumonia.  Antibiotics were ordered.  There were no acute findings on CT head.  I suspect her altered mental status is secondary to acute pneumonia.  Lab work was notable for hypokalemia and hypomagnesemia.  Replacement electrolytes were ordered.  Patient was admitted to medicine for further management. I ordered medication including ceftriaxone and azithromycin for pneumonia; IV fluids for hydration; potassium chloride and magnesium sulfate for electrolyte optimization; dextrose for hypoglycemia Reevaluation of the patient after these medicines showed that the patient improved I have reviewed the patients home medicines and have made adjustments as needed   Social Determinants of Health:  Resides in skilled nursing facility  CRITICAL CARE Performed by: Godfrey Pick   Total critical care time: 32 minutes  Critical care time was exclusive of separately billable procedures and treating other patients.  Critical care was necessary to treat or prevent imminent or life-threatening deterioration.  Critical care was time spent personally by me on the following activities: development of treatment plan with patient and/or surrogate as well as nursing, discussions with consultants, evaluation of patient's response to treatment, examination of patient, obtaining history from patient  or surrogate, ordering and performing treatments and interventions, ordering and review of laboratory studies, ordering and review of radiographic studies, pulse oximetry and re-evaluation of patient's condition.         Final Clinical Impression(s) / ED Diagnoses Final diagnoses:  Pneumonia of right upper lobe due to infectious organism  Acute respiratory failure with hypoxia ([place])   Hypokalemia  Hypomagnesemia    Rx / DC Orders ED Discharge Orders     None         Godfrey Pick, MD 11/09/22 2251

## 2022-11-10 ENCOUNTER — Encounter (HOSPITAL_COMMUNITY): Payer: Self-pay | Admitting: Internal Medicine

## 2022-11-10 ENCOUNTER — Other Ambulatory Visit: Payer: Self-pay

## 2022-11-10 DIAGNOSIS — J189 Pneumonia, unspecified organism: Secondary | ICD-10-CM

## 2022-11-10 HISTORY — DX: Pneumonia, unspecified organism: J18.9

## 2022-11-10 LAB — COMPREHENSIVE METABOLIC PANEL
ALT: 8 U/L (ref 0–44)
AST: 20 U/L (ref 15–41)
Albumin: 1.8 g/dL — ABNORMAL LOW (ref 3.5–5.0)
Alkaline Phosphatase: 49 U/L (ref 38–126)
Anion gap: 9 (ref 5–15)
BUN: 13 mg/dL (ref 8–23)
CO2: 28 mmol/L (ref 22–32)
Calcium: 8.5 mg/dL — ABNORMAL LOW (ref 8.9–10.3)
Chloride: 105 mmol/L (ref 98–111)
Creatinine, Ser: 0.71 mg/dL (ref 0.44–1.00)
GFR, Estimated: >60 mL/min (ref >60)
Glucose, Bld: 73 mg/dL (ref 70–99)
Potassium: 3.8 mmol/L (ref 3.5–5.1)
Sodium: 142 mmol/L (ref 135–145)
Total Bilirubin: 0.5 mg/dL (ref 0.3–1.2)
Total Protein: 3.9 g/dL — ABNORMAL LOW (ref 6.5–8.1)

## 2022-11-10 LAB — CBC
HCT: 34.1 % — ABNORMAL LOW (ref 36.0–46.0)
Hemoglobin: 10.6 g/dL — ABNORMAL LOW (ref 12.0–15.0)
MCH: 31.1 pg (ref 26.0–34.0)
MCHC: 31.1 g/dL (ref 30.0–36.0)
MCV: 100 fL (ref 80.0–100.0)
Platelets: 79 10*3/uL — ABNORMAL LOW (ref 150–400)
RBC: 3.41 MIL/uL — ABNORMAL LOW (ref 3.87–5.11)
RDW: 15.2 % (ref 11.5–15.5)
WBC: 5.2 10*3/uL (ref 4.0–10.5)
nRBC: 0 % (ref 0.0–0.2)

## 2022-11-10 LAB — STREP PNEUMONIAE URINARY ANTIGEN: Strep Pneumo Urinary Antigen: NEGATIVE

## 2022-11-10 LAB — CBG MONITORING, ED
Glucose-Capillary: 70 mg/dL (ref 70–99)
Glucose-Capillary: 89 mg/dL (ref 70–99)
Glucose-Capillary: 83 mg/dL (ref 70–99)

## 2022-11-10 LAB — HIV ANTIBODY (ROUTINE TESTING W REFLEX): HIV Screen 4th Generation wRfx: NONREACTIVE

## 2022-11-10 MED ORDER — HEPARIN SODIUM (PORCINE) 5000 UNIT/ML IJ SOLN: 5000.0000 [IU] | Freq: Three times a day (TID) | INTRAMUSCULAR | Status: DC

## 2022-11-10 MED ORDER — AZITHROMYCIN 250 MG PO TABS: 500.0000 mg | ORAL_TABLET | ORAL | Status: DC

## 2022-11-10 MED ORDER — ACETAMINOPHEN 325 MG PO TABS: 650.0000 mg | ORAL_TABLET | Freq: Four times a day (QID) | ORAL | Status: DC | PRN

## 2022-11-10 MED ORDER — DIVALPROEX SODIUM ER 500 MG PO TB24: 1000.0000 mg | ORAL_TABLET | Freq: Every day | ORAL | Status: DC

## 2022-11-10 MED ORDER — LEVOTHYROXINE SODIUM 137 MCG PO TABS: 137.0000 ug | ORAL_TABLET | Freq: Every day | ORAL | Status: DC

## 2022-11-10 MED ORDER — IPRATROPIUM BROMIDE 0.02 % IN SOLN
0.5000 mg | RESPIRATORY_TRACT | Status: DC
  Administered 2022-11-10 (×4): 0.5 mg via RESPIRATORY_TRACT
  Filled 2022-11-10 (×3): qty 2.5

## 2022-11-10 MED ORDER — DOXYCYCLINE HYCLATE 100 MG PO CAPS: 100.0000 mg | ORAL_CAPSULE | Freq: Two times a day (BID) | ORAL | 0 refills | Status: AC

## 2022-11-10 MED ORDER — AMOXICILLIN 875 MG PO TABS: 875.0000 mg | ORAL_TABLET | Freq: Two times a day (BID) | ORAL | 0 refills | Status: AC

## 2022-11-10 MED ORDER — LEVOTHYROXINE SODIUM 25 MCG PO TABS: 137.0000 ug | ORAL_TABLET | Freq: Every day | ORAL | Status: DC

## 2022-11-10 MED ORDER — INSULIN DETEMIR 100 UNIT/ML FLEXPEN: 8.0000 [IU] | PEN_INJECTOR | Freq: Every day | SUBCUTANEOUS | Status: DC

## 2022-11-10 MED ORDER — ACETAMINOPHEN 650 MG RE SUPP: 650.0000 mg | Freq: Four times a day (QID) | RECTAL | Status: DC | PRN

## 2022-11-10 MED ORDER — SODIUM CHLORIDE 0.9 % IV SOLN: 1.0000 g | INTRAVENOUS | Status: DC

## 2022-11-10 NOTE — ED Notes (Signed)
Received verbal report from [facility] at this time

## 2022-11-10 NOTE — ED Notes (Signed)
[facility] called unable to give pick up time 

## 2022-11-10 NOTE — ED Notes (Signed)
Transport called for  patient to return to [place].

## 2022-11-10 NOTE — ED Notes (Signed)
[facility] will be arriving shortly

## 2022-11-10 NOTE — Care Management CC44 (Signed)
Condition Code 44 Documentation Completed  Patient Details  Name: KADY TOOTHAKER MRN: [id number] Date of Birth: 1951/07/11   Condition Code 44 given:  Yes Patient signature on Condition Code 44 notice:  Yes Documentation of 2 MD's agreement:  Yes Code 44 added to claim:  Yes    Fuller Mandril, RN 11/10/2022, 2:16 PM

## 2022-11-10 NOTE — Care Management Obs Status (Signed)
[facility] NOTIFICATION   Patient Details  Name: Claudia Keller MRN: [id number] Date of Birth: April 21, 1951   Medicare Observation Status Notification Given:  Yes    Fuller Mandril, RN 11/10/2022, 2:16 PM

## 2022-11-10 NOTE — Discharge Summary (Signed)
Physician Discharge Summary   Claudia Keller MVE:[id number] DOB: 1951/05/09 DOA: 11/09/2022  PCP: Elmore Guise, MD  Admit date: 11/09/2022 Discharge date: 11/10/2022 Barriers to discharge:  Admitted From: Claudia Keller Disposition:  [place] Discharging physician: [name], MD  Recommendations for Outpatient Follow-up:  Repeat thyroid studies in ~4 weeks; dose lowered prior to discharge Repeat depakote level in 1 week; dose lowered prior to discharge Adjust insulin if further hypoglycemia  Home Health:  Equipment/Devices:   Discharge Condition: stable CODE STATUS: Full Diet recommendation:  Diet Orders (From admission, onward)     Start     Ordered   11/10/22 0005  Diet heart healthy/carb modified Room service appropriate? Yes; Fluid consistency: Thin  Diet effective now       Question Answer Comment  Diet-HS Snack? Nothing   Room service appropriate? Yes   Fluid consistency: Thin      11/10/22 0005   11/10/22 0000  Diet - low sodium heart healthy        11/10/22 1242            Hospital Course:  CAP  - negative for PE on CTA chest - patchy RUL opacity, concerning for PNA - strep pna ur Ag negative; legionella pending at discharge - CURB-65 = 2 pts, patient improved rapidly after admission - s/p rocephin/azithro; de-escalate to amox/doxy to complete 5 more days  Acute hypoxic respiratory failure - resolved  - short term O2 required on admission; weaned off rapidly - on RA   DMII Hypoglycemia - decrease levemir to daily at discharge - may need further insulin adjustment  Hypothyroidism -low TSH (0.066) and FT4 elevated 1.53 - decrease synthroid from 150 to 137 and repeat thyroid studies in ~4 weeks   Acute metabolic  Encephalopathy -due to acute infection / hypoglycemia -treat underlying cause  -CTH negative for acute changes; chronic atrophy and ischemic changes   Hypokalemia  Hypomagnesemia  -repleted   Hypertension -resume home regimen       COPD -no acute exacerbation     Chronic diastolic heart failure -no acute exacerbation    Hx afib  CVA with left hemiparesis -continue asa and cardizem   Vascular dementia -no acute issues    hx of pericardial effusion  -with hemorrhagic conversion  -s/p pericardiotomy 10/2021   Depression Anxiety - elevated depakote level; decrease dose at discharge - repeat depakote level in ~1 week - continue remainder of home regimen   HLD -continue statin    Principal Diagnosis: CAP (community acquired pneumonia)  Discharge Diagnoses: Active Hospital Problems   Diagnosis Date Noted   CAP (community acquired pneumonia) 11/10/2022    Resolved Hospital Problems  No resolved problems to display.     Discharge Instructions     Diet - low sodium heart healthy   Complete by: As directed    Increase activity slowly   Complete by: As directed       Allergies as of 11/10/2022   No Known Allergies      Medication List     TAKE these medications    acetaminophen 325 MG tablet Commonly known as: TYLENOL Take 650 mg by mouth every 8 (eight) hours as needed for mild pain or moderate pain.   albuterol 108 (90 Base) MCG/ACT inhaler Commonly known as: VENTOLIN HFA Inhale 2 puffs into the lungs every 4 (four) hours as needed for wheezing or shortness of breath.   allopurinol 100 MG tablet Commonly known as: ZYLOPRIM Take 50 mg by mouth  daily. Idiopathic gout   alum & mag hydroxide-simeth 200-200-20 MG/5ML suspension Commonly known as: MAALOX/MYLANTA Take 30 mLs by mouth every 2 (two) hours as needed for indigestion or heartburn.   amoxicillin 875 MG tablet Commonly known as: AMOXIL Take 1 tablet (875 mg total) by mouth 2 (two) times daily for 5 days.   aspirin 81 MG chewable tablet Chew 81 mg by mouth daily.   bisacodyl 10 MG suppository Commonly known as: DULCOLAX Place 10 mg rectally daily as needed (constipation not relieved by MOM).   colchicine 0.6 MG  tablet Take 1 tablet (0.6 mg total) by mouth daily.   colestipol 1 g tablet Commonly known as: COLESTID TAKE 2 TABLETS DAILY FOR DIARRHEA. DO NOT TAKE WITHIN 2 HOURS OF OTHER ORAL MEDICATIONS. HOLD FOR CONSTIPATION. What changed:  how much to take how to take this when to take this   diltiazem 180 MG 24 hr capsule Commonly known as: DILACOR XR Take 180 mg by mouth daily.   divalproex 500 MG 24 hr tablet Commonly known as: DEPAKOTE ER Take 2 tablets (1,000 mg total) by mouth at bedtime. Start taking on: November 11, 2022 What changed:  how much to take when to take this additional instructions   doxycycline 100 MG capsule Commonly known as: VIBRAMYCIN Take 1 capsule (100 mg total) by mouth 2 (two) times daily for 5 days.   furosemide 20 MG tablet Commonly known as: LASIX Take 1 tablet (20 mg total) by mouth every other day.   insulin aspart 100 UNIT/ML injection Commonly known as: novoLOG Inject 5 Units into the skin 3 (three) times daily. For CBG >200   insulin detemir 100 UNIT/ML FlexPen Commonly known as: LEVEMIR Inject 8 Units into the skin daily. What changed: when to take this   levothyroxine 137 MCG tablet Commonly known as: SYNTHROID Take 1 tablet (137 mcg total) by mouth daily at 6 (six) AM. Start taking on: November 11, 2022 What changed:  medication strength how much to take when to take this   magnesium hydroxide 400 MG/5ML suspension Commonly known as: MILK OF MAGNESIA Take 30 mLs by mouth daily as needed (if no BM in 3 days).   memantine 10 MG tablet Commonly known as: NAMENDA Take 10 mg by mouth 2 (two) times daily.   mirtazapine 15 MG tablet Commonly known as: REMERON Take 15 mg by mouth at bedtime.   NUTRITIONAL SUPPLEMENT PO Take 1 each by mouth daily. Magic Cup   ondansetron 4 MG tablet Commonly known as: ZOFRAN Take 1 tablet (4 mg total) by mouth every 8 (eight) hours as needed for nausea or vomiting.   Oyster Shell Calcium w/D  500-200 MG-UNIT Tabs Take 1 tablet by mouth 2 (two) times daily.   pantoprazole 40 MG tablet Commonly known as: PROTONIX TAKE 1 TABLET BY MOUTH ONCE DAILY 30 MINTUES BEFORE BREAKFAST. What changed:  how much to take how to take this when to take this   PARoxetine 40 MG tablet Commonly known as: PAXIL Take 1 tablet (40 mg total) by mouth daily.   QUEtiapine 200 MG 24 hr tablet Commonly known as: SEROQUEL XR Take 200 mg by mouth at bedtime.   RA SALINE ENEMA RE Place 1 Dose rectally once as needed (severe constipation). If no relief from both milk of magnesia, and Bisacodyl suppository, administer one dose of disposable saline enema.   rosuvastatin 40 MG tablet Commonly known as: CRESTOR Take 1 tablet (40 mg total) by mouth daily.  senna-docusate 8.6-50 MG tablet Commonly known as: Senokot-S Take 1 tablet by mouth at bedtime as needed for mild constipation.   vitamin B-12 500 MCG tablet Commonly known as: CYANOCOBALAMIN Take 500 mcg by mouth daily.        No Known Allergies  Consultations:   Procedures:   Discharge Exam: BP (!) 115/58   Pulse 77   Temp 97.6 F (36.4 C) (Oral)   Resp 12   SpO2 93%  Physical Exam Constitutional:      Comments: Pleasant elderly woman resting in NAD; oriented to name, year, president  HENT:     Head: Normocephalic and atraumatic.     Mouth/Throat:     Mouth: Mucous membranes are moist.  Eyes:     Extraocular Movements: Extraocular movements intact.  Cardiovascular:     Rate and Rhythm: Regular rhythm.  Pulmonary:     Effort: Pulmonary effort is normal. No respiratory distress.     Breath sounds: No wheezing.     Comments: Mild right sided rhonchi Abdominal:     General: Bowel sounds are normal. There is no distension.     Palpations: Abdomen is soft.     Tenderness: There is no abdominal tenderness.  Musculoskeletal:        General: Normal range of motion.     Cervical back: Normal range of motion and neck supple.   Skin:    General: Skin is warm and dry.  Neurological:     Mental Status: She is oriented to person, place, and time. Mental status is at baseline.  Psychiatric:        Mood and Affect: Mood normal.     The results of significant diagnostics from this hospitalization (including imaging, microbiology, ancillary and laboratory) are listed below for reference.   Microbiology: Recent Results (from the past 240 hour(s))  Resp panel by RT-PCR (RSV, Flu A&B, Covid) Anterior Nasal Swab     Status: None   Collection Time: 11/09/22  7:25 PM   Specimen: Anterior Nasal Swab  Result Value Ref Range Status   SARS Coronavirus 2 by RT PCR NEGATIVE NEGATIVE Final    Comment: (NOTE) SARS-CoV-2 target nucleic acids are NOT DETECTED.  The SARS-CoV-2 RNA is generally detectable in upper respiratory specimens during the acute phase of infection. The lowest concentration of SARS-CoV-2 viral copies this assay can detect is 138 copies/mL. A negative result does not preclude SARS-Cov-2 infection and should not be used as the sole basis for treatment or other patient management decisions. A negative result may occur with  improper specimen collection/handling, submission of specimen other than nasopharyngeal swab, presence of viral mutation(s) within the areas targeted by this assay, and inadequate number of viral copies(<138 copies/mL). A negative result must be combined with clinical observations, patient history, and epidemiological information. The expected result is Negative.  Fact Sheet for Patients:  EntrepreneurPulse.com.au  Fact Sheet for Healthcare Providers:  IncredibleEmployment.be  This test is no t yet approved or cleared by the [place] FDA and  has been authorized for detection and/or diagnosis of SARS-CoV-2 by FDA under an Emergency Use Authorization (EUA). This EUA will remain  in effect (meaning this test can be used) for the duration of  the COVID-19 declaration under Section 564(b)(1) of the Act, 21 U.S.C.section 360bbb-3(b)(1), unless the authorization is terminated  or revoked sooner.       Influenza A by PCR NEGATIVE NEGATIVE Final   Influenza B by PCR NEGATIVE NEGATIVE Final    Comment: (  NOTE) The Xpert Xpress SARS-CoV-2/FLU/RSV plus assay is intended as an aid in the diagnosis of influenza from Nasopharyngeal swab specimens and should not be used as a sole basis for treatment. Nasal washings and aspirates are unacceptable for Xpert Xpress SARS-CoV-2/FLU/RSV testing.  Fact Sheet for Patients: EntrepreneurPulse.com.au  Fact Sheet for Healthcare Providers: IncredibleEmployment.be  This test is not yet approved or cleared by the [place] FDA and has been authorized for detection and/or diagnosis of SARS-CoV-2 by FDA under an Emergency Use Authorization (EUA). This EUA will remain in effect (meaning this test can be used) for the duration of the COVID-19 declaration under Section 564(b)(1) of the Act, 21 U.S.C. section 360bbb-3(b)(1), unless the authorization is terminated or revoked.     Resp Syncytial Virus by PCR NEGATIVE NEGATIVE Final    Comment: (NOTE) Fact Sheet for Patients: EntrepreneurPulse.com.au  Fact Sheet for Healthcare Providers: IncredibleEmployment.be  This test is not yet approved or cleared by the [place] FDA and has been authorized for detection and/or diagnosis of SARS-CoV-2 by FDA under an Emergency Use Authorization (EUA). This EUA will remain in effect (meaning this test can be used) for the duration of the COVID-19 declaration under Section 564(b)(1) of the Act, 21 U.S.C. section 360bbb-3(b)(1), unless the authorization is terminated or revoked.  Performed at [facility], [place] [address], [place], [place] [address]      Labs: BNP (last 3 results) No results for input(s): "BNP" in the  last 8760 hours. Basic Metabolic Panel: Recent Labs  Lab 11/09/22 1927 11/09/22 1943 11/09/22 1944 11/10/22 0206  NA 140 144 142 142  K 3.1* 3.2* 3.2* 3.8  CL 102  --  100 105  CO2 29  --   --  28  GLUCOSE 69*  --  69* 73  BUN 15  --  17 13  CREATININE 0.85  --  1.00 0.71  CALCIUM 9.8  --   --  8.5*  MG 1.2*  --   --   --    Liver Function Tests: Recent Labs  Lab 11/09/22 1927 11/10/22 0206  AST 19 20  ALT 13 8  ALKPHOS 64 49  BILITOT 0.6 0.5  PROT 4.9* 3.9*  ALBUMIN 2.3* 1.8*   No results for input(s): "LIPASE", "AMYLASE" in the last 168 hours. Recent Labs  Lab 11/09/22 1924  AMMONIA 18   CBC: Recent Labs  Lab 11/09/22 1927 11/09/22 1943 11/09/22 1944 11/10/22 0120  WBC 5.1  --   --  5.2  NEUTROABS 2.9  --   --   --   HGB 11.4* 10.9* 11.2* 10.6*  HCT 34.5* 32.0* 33.0* 34.1*  MCV 97.7  --   --  100.0  PLT 92*  --   --  79*   Cardiac Enzymes: No results for input(s): "CKTOTAL", "CKMB", "CKMBINDEX", "TROPONINI" in the last 168 hours. BNP: Invalid input(s): "POCBNP" CBG: Recent Labs  Lab 11/09/22 2001 11/09/22 2039 11/10/22 0119 11/10/22 0247 11/10/22 0833  GLUCAP 53* 183* 70 89 83   D-Dimer No results for input(s): "DDIMER" in the last 72 hours. Hgb A1c No results for input(s): "HGBA1C" in the last 72 hours. Lipid Profile No results for input(s): "CHOL", "HDL", "LDLCALC", "TRIG", "CHOLHDL", "LDLDIRECT" in the last 72 hours. Thyroid function studies Recent Labs    11/09/22 2000  TSH 0.066*   Anemia work up No results for input(s): "VITAMINB12", "FOLATE", "FERRITIN", "TIBC", "IRON", "RETICCTPCT" in the last 72 hours. Urinalysis    Component Value Date/Time  COLORURINE YELLOW 11/09/2022 2238   APPEARANCEUR HAZY (A) 11/09/2022 2238   LABSPEC 1.039 (H) 11/09/2022 2238   PHURINE 5.0 11/09/2022 2238   GLUCOSEU 50 (A) 11/09/2022 2238   HGBUR NEGATIVE 11/09/2022 2238   BILIRUBINUR NEGATIVE 11/09/2022 2238   KETONESUR 5 (A) 11/09/2022 2238    PROTEINUR 30 (A) 11/09/2022 2238   UROBILINOGEN 1.0 01/10/2015 1754   NITRITE NEGATIVE 11/09/2022 2238   LEUKOCYTESUR NEGATIVE 11/09/2022 2238   Sepsis Labs Recent Labs  Lab 11/09/22 1927 11/10/22 0120  WBC 5.1 5.2   Microbiology Recent Results (from the past 240 hour(s))  Resp panel by RT-PCR (RSV, Flu A&B, Covid) Anterior Nasal Swab     Status: None   Collection Time: 11/09/22  7:25 PM   Specimen: Anterior Nasal Swab  Result Value Ref Range Status   SARS Coronavirus 2 by RT PCR NEGATIVE NEGATIVE Final    Comment: (NOTE) SARS-CoV-2 target nucleic acids are NOT DETECTED.  The SARS-CoV-2 RNA is generally detectable in upper respiratory specimens during the acute phase of infection. The lowest concentration of SARS-CoV-2 viral copies this assay can detect is 138 copies/mL. A negative result does not preclude SARS-Cov-2 infection and should not be used as the sole basis for treatment or other patient management decisions. A negative result may occur with  improper specimen collection/handling, submission of specimen other than nasopharyngeal swab, presence of viral mutation(s) within the areas targeted by this assay, and inadequate number of viral copies(<138 copies/mL). A negative result must be combined with clinical observations, patient history, and epidemiological information. The expected result is Negative.  Fact Sheet for Patients:  EntrepreneurPulse.com.au  Fact Sheet for Healthcare Providers:  IncredibleEmployment.be  This test is no t yet approved or cleared by the [place] FDA and  has been authorized for detection and/or diagnosis of SARS-CoV-2 by FDA under an Emergency Use Authorization (EUA). This EUA will remain  in effect (meaning this test can be used) for the duration of the COVID-19 declaration under Section 564(b)(1) of the Act, 21 U.S.C.section 360bbb-3(b)(1), unless the authorization is terminated  or  revoked sooner.       Influenza A by PCR NEGATIVE NEGATIVE Final   Influenza B by PCR NEGATIVE NEGATIVE Final    Comment: (NOTE) The Xpert Xpress SARS-CoV-2/FLU/RSV plus assay is intended as an aid in the diagnosis of influenza from Nasopharyngeal swab specimens and should not be used as a sole basis for treatment. Nasal washings and aspirates are unacceptable for Xpert Xpress SARS-CoV-2/FLU/RSV testing.  Fact Sheet for Patients: EntrepreneurPulse.com.au  Fact Sheet for Healthcare Providers: IncredibleEmployment.be  This test is not yet approved or cleared by the [place] FDA and has been authorized for detection and/or diagnosis of SARS-CoV-2 by FDA under an Emergency Use Authorization (EUA). This EUA will remain in effect (meaning this test can be used) for the duration of the COVID-19 declaration under Section 564(b)(1) of the Act, 21 U.S.C. section 360bbb-3(b)(1), unless the authorization is terminated or revoked.     Resp Syncytial Virus by PCR NEGATIVE NEGATIVE Final    Comment: (NOTE) Fact Sheet for Patients: EntrepreneurPulse.com.au  Fact Sheet for Healthcare Providers: IncredibleEmployment.be  This test is not yet approved or cleared by the [place] FDA and has been authorized for detection and/or diagnosis of SARS-CoV-2 by FDA under an Emergency Use Authorization (EUA). This EUA will remain in effect (meaning this test can be used) for the duration of the COVID-19 declaration under Section 564(b)(1) of the Act, 21 U.S.C. section 360bbb-3(b)(1),  unless the authorization is terminated or revoked.  Performed at [facility], [place] [address], [place], [place] [address]     Procedures/Studies: CT Angio Chest PE W and/or Wo Contrast  Result Date: 11/09/2022 CLINICAL DATA:  Altered mental status and difficulty breathing EXAM: CT ANGIOGRAPHY CHEST WITH CONTRAST TECHNIQUE:  Multidetector CT imaging of the chest was performed using the standard protocol during bolus administration of intravenous contrast. Multiplanar CT image reconstructions and MIPs were obtained to evaluate the vascular anatomy. RADIATION DOSE REDUCTION: This exam was performed according to the departmental dose-optimization program which includes automated exposure control, adjustment of the mA and/or kV according to patient size and/or use of iterative reconstruction technique. CONTRAST:  41m OMNIPAQUE IOHEXOL 350 MG/ML SOLN COMPARISON:  Chest x-ray from earlier in the same day. FINDINGS: Cardiovascular: Atherosclerotic calcifications of the aorta are noted. No aneurysmal dilatation is seen. No dissection is noted. No cardiac enlargement is seen. Coronary calcifications are noted. The pulmonary artery shows a normal branching pattern. No filling defect to suggest pulmonary embolism is noted. Mediastinum/Nodes: Thoracic inlet is within normal limits. No hilar or mediastinal adenopathy is noted. The esophagus as visualized is within normal limits. Lungs/Pleura: Lungs are well aerated bilaterally. No sizable parenchymal nodule is seen. Patchy infiltrate is noted in the right upper lobe consistent with acute pneumonia. No sizable effusion is seen. Upper Abdomen: Visualized upper abdomen demonstrates changes of prior cholecystectomy. No other focal abnormality is noted. Musculoskeletal: Degenerative changes of the thoracic spine are noted. No acute rib abnormality is noted. Review of the MIP images confirms the above findings. IMPRESSION: No evidence of pulmonary emboli. Patchy right upper lobe infiltrate consistent with acute pneumonia. No other focal abnormality is noted. Aortic Atherosclerosis (ICD10-I70.0). Electronically Signed   By: MInez CatalinaM.D.   On: 11/09/2022 21:51   CT Head Wo Contrast  Result Date: 11/09/2022 CLINICAL DATA:  Altered mental status EXAM: CT HEAD WITHOUT CONTRAST TECHNIQUE: Contiguous  axial images were obtained from the base of the skull through the vertex without intravenous contrast. RADIATION DOSE REDUCTION: This exam was performed according to the departmental dose-optimization program which includes automated exposure control, adjustment of the mA and/or kV according to patient size and/or use of iterative reconstruction technique. COMPARISON:  12/02/2021 FINDINGS: Brain: No evidence of acute infarction, hemorrhage, hydrocephalus, extra-axial collection or mass lesion/mass effect. Prior lacunar infarct in the right basal ganglia is again identified and stable. Chronic white matter ischemic changes are noted as well as mild atrophic changes. Vascular: No hyperdense vessel or unexpected calcification. Skull: Normal. Negative for fracture or focal lesion. Sinuses/Orbits: No acute finding. Other: None. IMPRESSION: Chronic atrophic and ischemic changes.  No acute abnormality noted. Electronically Signed   By: MInez CatalinaM.D.   On: 11/09/2022 21:48   DG Chest Port 1 View  Result Date: 11/09/2022 CLINICAL DATA:  Altered mental status. EXAM: PORTABLE CHEST 1 VIEW COMPARISON:  December 02, 2021 FINDINGS: The heart size and mediastinal contours are within normal limits. There is moderate severity calcification of the aortic arch. Both lungs are clear. Radiopaque surgical clips are seen within the right upper quadrant. The visualized skeletal structures are unremarkable. IMPRESSION: No active cardiopulmonary disease. Electronically Signed   By: TVirgina NorfolkM.D.   On: 11/09/2022 20:02     Time coordinating discharge: Over 30 minutes    DDwyane Dee MD  [facility] 11/10/2022, 12:53 PM

## 2022-11-11 LAB — URINE CULTURE

## 2022-11-11 LAB — T3, FREE: T3, Free: 1.9 pg/mL — ABNORMAL LOW (ref 2.0–4.4)

## 2022-11-12 LAB — LEGIONELLA PNEUMOPHILA SEROGP 1 UR AG: L. pneumophila Serogp 1 Ur Ag: NEGATIVE

## 2022-12-14 ENCOUNTER — Encounter: Payer: Self-pay | Admitting: Physician Assistant

## 2022-12-14 ENCOUNTER — Ambulatory Visit: Payer: PRIVATE HEALTH INSURANCE | Attending: Physician Assistant | Admitting: Physician Assistant

## 2022-12-14 VITALS — BP 144/68 | HR 76 | Ht 64.0 in

## 2022-12-14 DIAGNOSIS — I10 Essential (primary) hypertension: Secondary | ICD-10-CM

## 2022-12-14 DIAGNOSIS — I69359 Hemiplegia and hemiparesis following cerebral infarction affecting unspecified side: Secondary | ICD-10-CM | POA: Diagnosis not present

## 2022-12-14 DIAGNOSIS — E782 Mixed hyperlipidemia: Secondary | ICD-10-CM

## 2022-12-14 DIAGNOSIS — E118 Type 2 diabetes mellitus with unspecified complications: Secondary | ICD-10-CM | POA: Diagnosis not present

## 2022-12-14 DIAGNOSIS — Z794 Long term (current) use of insulin: Secondary | ICD-10-CM

## 2022-12-14 DIAGNOSIS — I3139 Other pericardial effusion (noninflammatory): Secondary | ICD-10-CM

## 2022-12-14 DIAGNOSIS — Z87891 Personal history of nicotine dependence: Secondary | ICD-10-CM

## 2022-12-14 DIAGNOSIS — I480 Paroxysmal atrial fibrillation: Secondary | ICD-10-CM

## 2022-12-14 NOTE — Patient Instructions (Signed)
Medication Instructions:  Your physician recommends that you continue on your current medications as directed. Please refer to the Current Medication list given to you today.  *If you need a refill on your cardiac medications before your next appointment, please call your pharmacy*   Lab Work: Fasting lipid, CBC, BMP and Mag--to be drawn at facility If you have labs (blood work) drawn today and your tests are completely normal, you will receive your results only by: [place] (if you have MyChart) OR A paper copy in the mail If you have any lab test that is abnormal or we need to change your treatment, we will call you to review the results.   Follow-Up: At [facility], you and your health needs are our priority.  As part of our continuing mission to provide you with exceptional heart care, we have created designated Provider Care Teams.  These Care Teams include your primary Cardiologist (physician) and Advanced Practice Providers (APPs -  Physician Assistants and Nurse Practitioners) who all work together to provide you with the care you need, when you need it.   Your next appointment:   6 month(s)  Provider:   Freada Bergeron, MD

## 2022-12-14 NOTE — Progress Notes (Signed)
Office Visit    Patient Name: Claudia Keller Date of Encounter: 12/14/2022  PCP:  Elmore Guise, MD   [place]  Cardiologist:  Freada Bergeron, MD  Advanced Practice Provider:  No care team member to display Electrophysiologist:  None   HPI    Claudia Keller is a 72 y.o. female with a hx of type 2 diabetes mellitus, COPD, hypothyroidism, hypertension, history of smoking, PAF, history of CVA, hyperlipidemia presents today for hospital follow-up visit.  Patient was last seen by Dr. Domenic Polite in August 2012 for chest pain.  EKG was nonspecific at this time.  She had not undergone any prior ischemic work-up prior to that time.  Smoking cessation was emphasized.  Echocardiogram and Myoview were ordered.  Echocardiogram revealed borderline LVH with normal LV function 60 to 65%.  Mildly increased RV wall thickness and normal cavity size.  Her stress test was probably negative revealing impaired exercise capacity and normal LV size, normal LV systolic function and no significant stress-induced EKG abnormalities.  Overall, no convincing evidence of significant ischemia.  More recently, December 02, 2021 she presented to the ED with complaint of a fall.  She reports that she was bending over to pick something up off the floor, lost her balance and fell forward onto the ground.  She states that she "almost passed out".  She is not on any blood thinners.  No chest pain prior to the fall and no palpitations.  After her fall she reports she was unable to get up on her own.  The patient resides at [facility].  A thorough work-up was performed such as a CT scan of the head, C-spine, and face, x-ray of the chest, x-ray of the humerus.  No acute processes were found.  Cardiac monitoring revealed sinus tachycardia.  This was likely a mechanical fall.  Patient does have history of dementia.  Troponin was negative with no chest pain.  Labs were stable, no seizure-like activity was  reported.  She did have an echocardiogram performed on 11/20/2021 which showed a very small pericardial effusion.  Recommended follow-up echocardiogram in 3 to 4 months to make sure that this does not recur.  She was seen by me 01/2022, and she presented  from [place] and cannot hold herself up in the chair. We confirmed with the facility that this is her baseline after her stroke as well as having dementia and several other psych diagnosis.  She was recently started on a few new psych medications and this is the reason for her shaking. Poor historian, and continued to say "help me" through the encounter. She was telling that that she fell the other day and hit her head. Confirmed with the facility that this was when she was in the hospital back in January. She went to the hospital and the proper work-up was done at that time (listed above). She denies SOB and palpitations. She states she no longer smokes. She states she did have some chest pain a few days ago but can't really explain any other details.   She was seen in the ED 11/09/2022 to 11/10/2022 for altered mental status.  She presented via EMS from [place].  They reported that EMS received stated that a staff member at the facility had last seen her normal 5 days prior.  The patient has been lethargic.  She was placed on supplemental oxygen.  EMS noted a wet cough but otherwise normal vital signs and  CBG reading.  A chest x-ray, CT of the head and CTA of the chest were ordered.  On CTA of the chest a right upper lobe pneumonia was found.  Antibiotics ordered.  No acute findings on CT of the head.  Lab work was notable for hypokalemia and hypomagnesemia.  Replacement electrolytes were ordered.  IV hydration.  Patient showed improvement and was discharged.  Today, she is hanging in there today " she has been resting and trying to recover." No fast heart beats. No chest or SOB. She stays in [facility] and is symptomatic. She does has a hx of  frequent falls so she was taken off Eliquis. We discussed her most recent ER visit. We will plan to repeat labs when she is fasting. Overall, doing well from a cardiac standpoint.  Reports no shortness of breath nor dyspnea on exertion. Reports no chest pain, pressure, or tightness. No edema, orthopnea, PND. Reports no palpitations.   Past Medical History    Past Medical History:  Diagnosis Date   Arthritis    COPD (chronic obstructive pulmonary disease) ([place])    Diverticulosis    Essential hypertension, benign    Hx of colonic polyps    Hypothyroidism    IBS (irritable bowel syndrome)    Ketoacidosis    Mixed hyperlipidemia    PTSD (post-traumatic stress disorder)    S/P colonoscopy 11/2009   RMR: pancolonic diverticula, polypoid rectal mucosa with  prominent lymphoid aggregates, repeat in Jan 2016    Stress incontinence, female    Tobacco use    Type 2 diabetes mellitus ([place])    Past Surgical History:  Procedure Laterality Date   Bilateral tubal ligation     BREAST BIOPSY Left 10/16/2014   negative   CHOLECYSTECTOMY  09/11/2012   Procedure: LAPAROSCOPIC CHOLECYSTECTOMY;  Surgeon: Jamesetta So, MD;  Location: [facility];  Service: General;  Laterality: N/A;   COLONOSCOPY  11/17/2009   Dr. Gala Romney: normal rectum, pancolonic diverticula, polyps benign, no adenomas.    COLONOSCOPY N/A 02/12/2015   Dr. Gala Romney: colonic diverticulosis, multiple colonic polyps (tubular adenomas), negative segmental biopsies. Surveillance in 2021   ESOPHAGOGASTRODUODENOSCOPY  11/23/2011   Dr. Gala Romney: Erosive reflux esophagitis; small hiatal hernia; esophagus dilated empirically   MALONEY DILATION  11/23/2011   Procedure: Venia Minks DILATION;  Surgeon: Daneil Dolin, MD;  Location: [facility];  Service: Endoscopy;  Laterality: N/A;   PERICARDIOCENTESIS N/A 11/06/2021   Procedure: PERICARDIOCENTESIS;  Surgeon: Jettie Booze, MD;  Location: [facility];  Service: Cardiovascular;  Laterality: N/A;    SKIN LESION EXCISION     on buttocks   TUBAL LIGATION      Allergies  No Known Allergies   EKGs/Labs/Other Studies Reviewed:   The following studies were reviewed today:  01/27/22 Echocardiogram  IMPRESSIONS     1. Left ventricular ejection fraction, by estimation, is 60 to 65%. The  left ventricle has normal function.   2. Right ventricular systolic function is normal.   3. There is no evidence of cardiac tamponade.   FINDINGS   Left Ventricle: Left ventricular ejection fraction, by estimation, is 60  to 65%. The left ventricle has normal function.   Right Ventricle: Right ventricular systolic function is normal.   Pericardium: Trivial pericardial effusion is present. There is no evidence  of cardiac tamponade.   11/06/2021  Successful pericardiocentesis removing 420 cc of bloody fluid.   Await cytology results and other laboratory analysis of the pericardial fluid. Consider removing  pericardial drain tomorrow depending on the output.   Results conveyed to son, Charlotte Crumb.  EKG:  EKG is not ordered today.    Recent Labs: 11/09/2022: Magnesium 1.2; TSH 0.066 11/10/2022: ALT 8; BUN 13; Creatinine, Ser 0.71; Hemoglobin 10.6; Platelets 79; Potassium 3.8; Sodium 142  Recent Lipid Panel    Component Value Date/Time   CHOL 133 02/25/2022 0000   TRIG 82 02/25/2022 0000   HDL 36 02/25/2022 0000   CHOLHDL 7.1 09/03/2021 0357   VLDL 34 09/03/2021 0357   LDLCALC 65 02/25/2022 0000   LDLCALC 81 08/14/2020 1042     Home Medications   Current Meds  Medication Sig   acetaminophen (TYLENOL) 325 MG tablet Take 650 mg by mouth every 8 (eight) hours as needed for mild pain or moderate pain.   albuterol (VENTOLIN HFA) 108 (90 Base) MCG/ACT inhaler Inhale 2 puffs into the lungs every 4 (four) hours as needed for wheezing or shortness of breath.   allopurinol (ZYLOPRIM) 100 MG tablet Take 50 mg by mouth daily. Idiopathic gout   alum & mag hydroxide-simeth (MAALOX/MYLANTA)  200-200-20 MG/5ML suspension Take 30 mLs by mouth every 2 (two) hours as needed for indigestion or heartburn.   aspirin 81 MG chewable tablet Chew 81 mg by mouth daily.   bisacodyl (DULCOLAX) 10 MG suppository Place 10 mg rectally daily as needed (constipation not relieved by MOM).   Calcium Carb-Cholecalciferol (OYSTER SHELL CALCIUM W/D) 500-200 MG-UNIT TABS Take 1 tablet by mouth 2 (two) times daily.   colchicine 0.6 MG tablet Take 1 tablet (0.6 mg total) by mouth daily.   colestipol (COLESTID) 1 g tablet TAKE 2 TABLETS DAILY FOR DIARRHEA. DO NOT TAKE WITHIN 2 HOURS OF OTHER ORAL MEDICATIONS. HOLD FOR CONSTIPATION. (Patient taking differently: Take 2 g by mouth daily. TAKE 2 TABLETS DAILY FOR DIARRHEA. DO NOT TAKE WITHIN 2 HOURS OF OTHER ORAL MEDICATIONS. HOLD FOR CONSTIPATION.)   diltiazem (DILACOR XR) 180 MG 24 hr capsule Take 180 mg by mouth daily.   divalproex (DEPAKOTE ER) 500 MG 24 hr tablet Take 2 tablets (1,000 mg total) by mouth at bedtime.   furosemide (LASIX) 20 MG tablet Take 1 tablet (20 mg total) by mouth every other day.   insulin aspart (NOVOLOG) 100 UNIT/ML injection Inject 5 Units into the skin 3 (three) times daily. For CBG >200   insulin detemir (LEVEMIR) 100 UNIT/ML FlexPen Inject 8 Units into the skin daily.   levothyroxine (SYNTHROID) 150 MCG tablet Take 150 mcg by mouth daily.   magnesium hydroxide (MILK OF MAGNESIA) 400 MG/5ML suspension Take 30 mLs by mouth daily as needed (if no BM in 3 days).   memantine (NAMENDA) 10 MG tablet Take 10 mg by mouth 2 (two) times daily.   mirtazapine (REMERON) 15 MG tablet Take 15 mg by mouth at bedtime.   Nutritional Supplements (NUTRITIONAL SUPPLEMENT PO) Take 1 each by mouth daily. Magic Cup   ondansetron (ZOFRAN) 4 MG tablet Take 1 tablet (4 mg total) by mouth every 8 (eight) hours as needed for nausea or vomiting.   pantoprazole (PROTONIX) 40 MG tablet TAKE 1 TABLET BY MOUTH ONCE DAILY 30 MINTUES BEFORE BREAKFAST. (Patient taking  differently: Take 40 mg by mouth daily before breakfast. TAKE 1 TABLET BY MOUTH ONCE DAILY 30 MINTUES BEFORE BREAKFAST.)   PARoxetine (PAXIL) 40 MG tablet Take 1 tablet (40 mg total) by mouth daily.   QUEtiapine (SEROQUEL XR) 200 MG 24 hr tablet Take 200 mg by mouth at bedtime.  rosuvastatin (CRESTOR) 40 MG tablet Take 1 tablet (40 mg total) by mouth daily.   senna-docusate (SENOKOT-S) 8.6-50 MG tablet Take 1 tablet by mouth at bedtime as needed for mild constipation.   Sodium Phosphates (RA SALINE ENEMA RE) Place 1 Dose rectally once as needed (severe constipation). If no relief from both milk of magnesia, and Bisacodyl suppository, administer one dose of disposable saline enema.   vitamin B-12 (CYANOCOBALAMIN) 500 MCG tablet Take 500 mcg by mouth daily.   [DISCONTINUED] diltiazem (CARDIZEM CD) 180 MG 24 hr capsule Take 180 mg by mouth daily.     Review of Systems      All other systems reviewed and are otherwise negative except as noted above.  Physical Exam    VS:  BP (!) 144/68   Pulse 76   Ht '5\' 4"'$  (1.626 m)   BMI 20.60 kg/m  , BMI Body mass index is 20.6 kg/m.  Wt Readings from Last 3 Encounters:  11/10/22 120 lb (54.4 kg)  03/26/22 122 lb 6.4 oz (55.5 kg)  03/09/22 122 lb 6.4 oz (55.5 kg)     GEN: Well nourished, well developed, in no acute distress. HEENT: normal. Neck: Supple, no JVD, carotid bruits, or masses. Cardiac:irregularly irregular , no murmurs, rubs, or gallops. No clubbing, cyanosis, edema.  Radials/PT 2+ and equal bilaterally.  Respiratory:  Respirations regular and unlabored, clear to auscultation bilaterally. GI: Soft, nontender, nondistended. MS: No deformity or atrophy. Skin: Warm and dry, no rash. Neuro:  Strength and sensation are intact. Psych: Normal affect.  Assessment & Plan    PAF -Used to be on Eliquis but had too many falls so this was discontinued -She is taking diltiazem extended release 180 mg -Appears well controlled -CHA2DS2-VASc  score of 6 (9.7% annual stroke)  Hypertension -Well controlled today in the clinic -Continue to monitor at [facility]  Hyperlipidemia -LDL 65 -continue Crestor '40mg'$   -repeat Lipid panel with routine lab work (CBC, BMP, mag)  Smoking history -Does not smoke anymore  Type 2 diabetes mellitus -last A1C 7.8  -Care per PCP  History of CVA -Weakness in her left side, in a wheelchair today -Having difficultly holding herself in her chair -Confirmed with [facility] that this is her baseline and her psych medications were recently changed which might explain her continuous tremor  -continue therapy    Disposition: Follow up 6 months with Freada Bergeron, MD or APP.  Signed, Elgie Collard, PA-C 12/14/2022, 4:13 PM [facility]

## 2023-01-06 ENCOUNTER — Encounter: Payer: Self-pay | Admitting: Radiology

## 2023-03-05 ENCOUNTER — Encounter (HOSPITAL_COMMUNITY): Payer: Self-pay | Admitting: Emergency Medicine

## 2023-03-05 ENCOUNTER — Other Ambulatory Visit: Payer: Self-pay

## 2023-03-05 ENCOUNTER — Emergency Department (HOSPITAL_COMMUNITY)
Admission: EM | Admit: 2023-03-05 | Discharge: 2023-03-06 | Disposition: A | Discharge status: Skilled Nursing Facility | Payer: Medicare Other | Attending: Emergency Medicine | Admitting: Emergency Medicine

## 2023-03-05 DIAGNOSIS — Z794 Long term (current) use of insulin: Secondary | ICD-10-CM | POA: Diagnosis not present

## 2023-03-05 DIAGNOSIS — W06XXXA Fall from bed, initial encounter: Secondary | ICD-10-CM | POA: Diagnosis not present

## 2023-03-05 DIAGNOSIS — Y92129 Unspecified place in nursing home as the place of occurrence of the external cause: Secondary | ICD-10-CM | POA: Diagnosis not present

## 2023-03-05 DIAGNOSIS — Z7982 Long term (current) use of aspirin: Secondary | ICD-10-CM | POA: Diagnosis not present

## 2023-03-05 DIAGNOSIS — S0181XA Laceration without foreign body of other part of head, initial encounter: Secondary | ICD-10-CM | POA: Insufficient documentation

## 2023-03-05 DIAGNOSIS — S0990XA Unspecified injury of head, initial encounter: Secondary | ICD-10-CM

## 2023-03-05 NOTE — ED Triage Notes (Signed)
Pt BIB [facility] from [facility]. Pt fell out of bed, hitting head pt has 1cm Lac to left side of face. No blood thinners. Dementia at baseline

## 2023-03-05 NOTE — ED Provider Notes (Signed)
[facility] EMERGENCY DEPARTMENT AT [facility] Provider Note   CSN: [id number] Arrival date & time: 03/05/23  2327     History {Add pertinent medical, surgical, social history, OB history to HPI:1} Chief Complaint  Patient presents with   Claudia Keller is a 72 y.o. female.  Patient presents to the emergency department after a fall.  Patient reports that she rolled out of her bed, may have hit her face on bedside table or the floor.  She is without complaints at arrival.       Home Medications Prior to Admission medications   Medication Sig Start Date End Date Taking? Authorizing Provider  acetaminophen (TYLENOL) 325 MG tablet Take 650 mg by mouth every 8 (eight) hours as needed for mild pain or moderate pain.    [provider]  albuterol (VENTOLIN HFA) 108 (90 Base) MCG/ACT inhaler Inhale 2 puffs into the lungs every 4 (four) hours as needed for wheezing or shortness of breath. 12/03/20   Sherryll Burger, Pratik D, DO  allopurinol (ZYLOPRIM) 100 MG tablet Take 50 mg by mouth daily. Idiopathic gout    [provider]  alum & mag hydroxide-simeth (MAALOX/MYLANTA) 200-200-20 MG/5ML suspension Take 30 mLs by mouth every 2 (two) hours as needed for indigestion or heartburn.    [provider]  aspirin 81 MG chewable tablet Chew 81 mg by mouth daily.    [provider]  bisacodyl (DULCOLAX) 10 MG suppository Place 10 mg rectally daily as needed (constipation not relieved by MOM).    [provider]  Calcium Carb-Cholecalciferol (OYSTER SHELL CALCIUM W/D) 500-200 MG-UNIT TABS Take 1 tablet by mouth 2 (two) times daily. 11/10/20   [provider]  colchicine 0.6 MG tablet Take 1 tablet (0.6 mg total) by mouth daily. 11/11/21 11/08/48  Marrion Coy, MD  colestipol (COLESTID) 1 g tablet TAKE 2 TABLETS DAILY FOR DIARRHEA. DO NOT TAKE WITHIN 2 HOURS OF OTHER ORAL MEDICATIONS. HOLD FOR CONSTIPATION. Patient taking differently: Take 2 g by  mouth daily. TAKE 2 TABLETS DAILY FOR DIARRHEA. DO NOT TAKE WITHIN 2 HOURS OF OTHER ORAL MEDICATIONS. HOLD FOR CONSTIPATION. 05/23/20   Anice Paganini, NP  diltiazem (DILACOR XR) 180 MG 24 hr capsule Take 180 mg by mouth daily.    [provider]  divalproex (DEPAKOTE ER) 500 MG 24 hr tablet Take 2 tablets (1,000 mg total) by mouth at bedtime. 11/11/22   Lewie Chamber, MD  furosemide (LASIX) 20 MG tablet Take 1 tablet (20 mg total) by mouth every other day. 09/07/21   Johnson, Clanford L, MD  insulin aspart (NOVOLOG) 100 UNIT/ML injection Inject 5 Units into the skin 3 (three) times daily. For CBG >200    [provider]  insulin detemir (LEVEMIR) 100 UNIT/ML FlexPen Inject 8 Units into the skin daily. 11/10/22   Lewie Chamber, MD  levothyroxine (SYNTHROID) 150 MCG tablet Take 150 mcg by mouth daily. 11/26/22   [provider]  magnesium hydroxide (MILK OF MAGNESIA) 400 MG/5ML suspension Take 30 mLs by mouth daily as needed (if no BM in 3 days).    [provider]  memantine (NAMENDA) 10 MG tablet Take 10 mg by mouth 2 (two) times daily.    [provider]  mirtazapine (REMERON) 15 MG tablet Take 15 mg by mouth at bedtime.    [provider]  Nutritional Supplements (NUTRITIONAL SUPPLEMENT PO) Take 1 each by mouth daily. Manufacturing engineer, Historical,  MD  ondansetron (ZOFRAN) 4 MG tablet Take 1 tablet (4 mg total) by mouth every 8 (eight) hours as needed for nausea or vomiting. 10/17/18   Anice Paganini, NP  pantoprazole (PROTONIX) 40 MG tablet TAKE 1 TABLET BY MOUTH ONCE DAILY 30 MINTUES BEFORE BREAKFAST. Patient taking differently: Take 40 mg by mouth daily before breakfast. TAKE 1 TABLET BY MOUTH ONCE DAILY 30 MINTUES BEFORE BREAKFAST. 05/23/20   Anice Paganini, NP  PARoxetine (PAXIL) 40 MG tablet Take 1 tablet (40 mg total) by mouth daily. 02/20/21   Neysa Hotter, MD  QUEtiapine (SEROQUEL XR) 200 MG 24 hr tablet Take 200 mg by mouth at bedtime.     [provider]  rosuvastatin (CRESTOR) 40 MG tablet Take 1 tablet (40 mg total) by mouth daily. 01/12/22   Sharlene Dory, PA-C  senna-docusate (SENOKOT-S) 8.6-50 MG tablet Take 1 tablet by mouth at bedtime as needed for mild constipation. 09/04/21   Johnson, Clanford L, MD  Sodium Phosphates (RA SALINE ENEMA RE) Place 1 Dose rectally once as needed (severe constipation). If no relief from both milk of magnesia, and Bisacodyl suppository, administer one dose of disposable saline enema.    [provider]  vitamin B-12 (CYANOCOBALAMIN) 500 MCG tablet Take 500 mcg by mouth daily.    [provider]      Allergies    Patient has no known allergies.    Review of Systems   Review of Systems  Physical Exam Updated Vital Signs SpO2 93%  Physical Exam Vitals and nursing note reviewed.  Constitutional:      General: She is not in acute distress.    Appearance: She is well-developed.  HENT:     Head: Normocephalic and atraumatic.     Mouth/Throat:     Mouth: Mucous membranes are moist.  Eyes:     General: Vision grossly intact. Gaze aligned appropriately.     Extraocular Movements: Extraocular movements intact.     Conjunctiva/sclera: Conjunctivae normal.   Cardiovascular:     Rate and Rhythm: Normal rate and regular rhythm.     Pulses: Normal pulses.     Heart sounds: Normal heart sounds, S1 normal and S2 normal. No murmur heard.    No friction rub. No gallop.  Pulmonary:     Effort: Pulmonary effort is normal. No respiratory distress.     Breath sounds: Normal breath sounds.  Abdominal:     General: Bowel sounds are normal.     Palpations: Abdomen is soft.     Tenderness: There is no abdominal tenderness. There is no guarding or rebound.     Hernia: No hernia is present.  Musculoskeletal:        General: No swelling.     Cervical back: Full passive range of motion without pain, normal range of motion and neck supple. No spinous process tenderness or  muscular tenderness. Normal range of motion.     Right hip: Normal. No deformity.     Left hip: Normal. No deformity.     Right lower leg: No edema.     Left lower leg: No edema.  Skin:    General: Skin is warm and dry.     Capillary Refill: Capillary refill takes less than 2 seconds.     Findings: No ecchymosis, erythema, rash or wound.  Neurological:     General: No focal deficit present.     Mental Status: She is alert and oriented to person, place, and time.  GCS: GCS eye subscore is 4. GCS verbal subscore is 5. GCS motor subscore is 6.     Cranial Nerves: Cranial nerves 2-12 are intact.     Sensory: Sensation is intact.     Motor: Motor function is intact.     Coordination: Coordination is intact.  Psychiatric:        Attention and Perception: Attention normal.        Mood and Affect: Mood normal.        Speech: Speech normal.        Behavior: Behavior normal.     ED Results / Procedures / Treatments   Labs (all labs ordered are listed, but only abnormal results are displayed) Labs Reviewed - No data to display  EKG None  Radiology No results found.  Procedures Procedures  {Document cardiac monitor, telemetry assessment procedure when appropriate:1}  Medications Ordered in ED Medications - No data to display  ED Course/ Medical Decision Making/ A&P   {   Click here for ABCD2, HEART and other calculatorsREFRESH Note before signing :1}                          Medical Decision Making Amount and/or Complexity of Data Reviewed Radiology: ordered.   ***  {Document critical care time when appropriate:1} {Document review of labs and clinical decision tools ie heart score, Chads2Vasc2 etc:1}  {Document your independent review of radiology images, and any outside records:1} {Document your discussion with family members, caretakers, and with consultants:1} {Document social determinants of health affecting pt's care:1} {Document your decision making why or why  not admission, treatments were needed:1} Final Clinical Impression(s) / ED Diagnoses Final diagnoses:  None    Rx / DC Orders ED Discharge Orders     None

## 2023-03-06 ENCOUNTER — Emergency Department (HOSPITAL_COMMUNITY): Payer: Medicare Other

## 2023-03-06 DIAGNOSIS — S0181XA Laceration without foreign body of other part of head, initial encounter: Secondary | ICD-10-CM | POA: Diagnosis not present

## 2023-03-06 NOTE — ED Notes (Signed)
Pts face lac cleaned and dressing applied

## 2023-03-06 NOTE — ED Notes (Signed)
Pt transported to CT ?

## 2023-03-06 NOTE — ED Notes (Signed)
Ptar called 

## 2023-06-02 ENCOUNTER — Encounter: Payer: Self-pay | Admitting: Cardiology

## 2023-06-27 ENCOUNTER — Ambulatory Visit: Payer: Medicare Other | Admitting: Cardiology

## 2023-07-29 ENCOUNTER — Ambulatory Visit: Payer: PRIVATE HEALTH INSURANCE | Admitting: Physician Assistant

## 2023-08-02 NOTE — Progress Notes (Deleted)
Cardiology Clinic Note   Patient Name: Claudia Keller Date of Encounter: 08/02/2023  Primary Care Provider:  Marinda Elk, MD Primary Cardiologist:  Meriam Sprague, MD (needs to be established with new cardiologist).  Patient Profile    72 year old female with history of PAF on Eliquis, hypothyroidism, CVA with left-sided weakness, COPD, type 2 diabetes, hypertension, hyperlipidemia, IBS and OA.  Last seen by cardiology [id number] while hospitalization and had pericardial effusion.  There was no evidence of tamponade.  She did have pericardiocentesis on 11/06/2021.  She did have follow-up appointment with Francisco Capuchin today on 12/14/2022 remains very frail living at [facility] skilled nursing facility and was stable from a cardiac standpoint.  She was no longer on Eliquis due to frequent falls, CHA2DS2-VASc score 6.  Past Medical History    Past Medical History:  Diagnosis Date   Arthritis    COPD (chronic obstructive pulmonary disease) (HCC)    Diverticulosis    Essential hypertension, benign    Hx of colonic polyps    Hypothyroidism    IBS (irritable bowel syndrome)    Ketoacidosis    Mixed hyperlipidemia    PTSD (post-traumatic stress disorder)    S/P colonoscopy 11/2009   RMR: pancolonic diverticula, polypoid rectal mucosa with  prominent lymphoid aggregates, repeat in Jan 2016    Stress incontinence, female    Tobacco use    Type 2 diabetes mellitus (HCC)    Past Surgical History:  Procedure Laterality Date   Bilateral tubal ligation     BREAST BIOPSY Left 10/16/2014   negative   CHOLECYSTECTOMY  09/11/2012   Procedure: LAPAROSCOPIC CHOLECYSTECTOMY;  Surgeon: Dalia Heading, MD;  Location: [facility];  Service: General;  Laterality: N/A;   COLONOSCOPY  11/17/2009   Dr. Jena Gauss: normal rectum, pancolonic diverticula, polyps benign, no adenomas.    COLONOSCOPY N/A 02/12/2015   Dr. Jena Gauss: colonic diverticulosis, multiple colonic polyps (tubular adenomas), negative segmental  biopsies. Surveillance in 2021   ESOPHAGOGASTRODUODENOSCOPY  11/23/2011   Dr. Jena Gauss: Erosive reflux esophagitis; small hiatal hernia; esophagus dilated empirically   MALONEY DILATION  11/23/2011   Procedure: Elease Hashimoto DILATION;  Surgeon: Corbin Ade, MD;  Location: [facility] SU[facility];  Service: Endoscopy;  Laterality: N/A;   PERICARDIOCENTESIS N/A 11/06/2021   Procedure: PERICARDIOCENTESIS;  Surgeon: Corky Crafts, MD;  Location: [facility] INVASIVE CV LAB;  Service: Cardiovascular;  Laterality: N/A;   SKIN LESION EXCISION     on buttocks   TUBAL LIGATION      Allergies  No Known Allergies  History of Present Illness    ***  Home Medications    Current Outpatient Medications  Medication Sig Dispense Refill   acetaminophen (TYLENOL) 325 MG tablet Take 650 mg by mouth every 8 (eight) hours as needed for mild pain or moderate pain.     albuterol (VENTOLIN HFA) 108 (90 Base) MCG/ACT inhaler Inhale 2 puffs into the lungs every 4 (four) hours as needed for wheezing or shortness of breath. 8 g 1   allopurinol (ZYLOPRIM) 100 MG tablet Take 50 mg by mouth daily. Idiopathic gout     alum & mag hydroxide-simeth (MAALOX/MYLANTA) 200-200-20 MG/5ML suspension Take 30 mLs by mouth every 2 (two) hours as needed for indigestion or heartburn.     aspirin 81 MG chewable tablet Chew 81 mg by mouth daily.     bisacodyl (DULCOLAX) 10 MG suppository Place 10 mg rectally daily as needed (constipation not relieved by MOM).     Calcium Carb-Cholecalciferol (  OYSTER SHELL CALCIUM W/D) 500-200 MG-UNIT TABS Take 1 tablet by mouth 2 (two) times daily.     colchicine 0.6 MG tablet Take 1 tablet (0.6 mg total) by mouth daily. 30 tablet 1   colestipol (COLESTID) 1 g tablet TAKE 2 TABLETS DAILY FOR DIARRHEA. DO NOT TAKE WITHIN 2 HOURS OF OTHER ORAL MEDICATIONS. HOLD FOR CONSTIPATION. (Patient taking differently: Take 2 g by mouth daily. TAKE 2 TABLETS DAILY FOR DIARRHEA. DO NOT TAKE WITHIN 2 HOURS OF OTHER ORAL MEDICATIONS.  HOLD FOR CONSTIPATION.) 60 tablet 11   diltiazem (DILACOR XR) 180 MG 24 hr capsule Take 180 mg by mouth daily.     divalproex (DEPAKOTE ER) 500 MG 24 hr tablet Take 2 tablets (1,000 mg total) by mouth at bedtime.     furosemide (LASIX) 20 MG tablet Take 1 tablet (20 mg total) by mouth every other day. 30 tablet    insulin aspart (NOVOLOG) 100 UNIT/ML injection Inject 5 Units into the skin 3 (three) times daily. For CBG >200     insulin detemir (LEVEMIR) 100 UNIT/ML FlexPen Inject 8 Units into the skin daily. 15 mL    levothyroxine (SYNTHROID) 150 MCG tablet Take 150 mcg by mouth daily.     magnesium hydroxide (MILK OF MAGNESIA) 400 MG/5ML suspension Take 30 mLs by mouth daily as needed (if no BM in 3 days).     memantine (NAMENDA) 10 MG tablet Take 10 mg by mouth 2 (two) times daily.     mirtazapine (REMERON) 15 MG tablet Take 15 mg by mouth at bedtime.     Nutritional Supplements (NUTRITIONAL SUPPLEMENT PO) Take 1 each by mouth daily. Magic Cup     ondansetron (ZOFRAN) 4 MG tablet Take 1 tablet (4 mg total) by mouth every 8 (eight) hours as needed for nausea or vomiting. 30 tablet 2   pantoprazole (PROTONIX) 40 MG tablet TAKE 1 TABLET BY MOUTH ONCE DAILY 30 MINTUES BEFORE BREAKFAST. (Patient taking differently: Take 40 mg by mouth daily before breakfast. TAKE 1 TABLET BY MOUTH ONCE DAILY 30 MINTUES BEFORE BREAKFAST.) 30 tablet 11   PARoxetine (PAXIL) 40 MG tablet Take 1 tablet (40 mg total) by mouth daily. 90 tablet 0   QUEtiapine (SEROQUEL XR) 200 MG 24 hr tablet Take 200 mg by mouth at bedtime.     rosuvastatin (CRESTOR) 40 MG tablet Take 1 tablet (40 mg total) by mouth daily. 90 tablet 3   senna-docusate (SENOKOT-S) 8.6-50 MG tablet Take 1 tablet by mouth at bedtime as needed for mild constipation.     Sodium Phosphates (RA SALINE ENEMA RE) Place 1 Dose rectally once as needed (severe constipation). If no relief from both milk of magnesia, and Bisacodyl suppository, administer one dose of  disposable saline enema.     vitamin B-12 (CYANOCOBALAMIN) 500 MCG tablet Take 500 mcg by mouth daily.     No current facility-administered medications for this visit.     Family History    Family History  Problem Relation Age of Onset   Cancer Mother        unsure what type   Heart disease Father    Heart attack Father    Schizophrenia Brother    Stroke Brother    Cancer Son        bladder   Coronary artery disease Other    Diabetes type II Other    Colon cancer Neg Hx    Breast cancer Neg Hx    She indicated that her mother  is deceased. She indicated that her father is deceased. She indicated that her brother is deceased. She indicated that her maternal grandmother is deceased. She indicated that her maternal grandfather is deceased. She indicated that her paternal grandmother is deceased. She indicated that her paternal grandfather is deceased. She indicated that both of her daughters are alive. She indicated that both of her sons are alive. She indicated that the status of her neg hx is unknown. She indicated that the status of her other is unknown.  Social History    Social History   Socioeconomic History   Marital status: Legally Separated    Spouse name: Not on file   Number of children: Not on file   Years of education: Not on file   Highest education level: Not on file  Occupational History   Not on file  Tobacco Use   Smoking status: Every Day    Current packs/day: 2.00    Average packs/day: 2.0 packs/day for 43.0 years (86.0 ttl pk-yrs)    Types: Cigarettes   Smokeless tobacco: Never  Vaping Use   Vaping status: Never Used  Substance and Sexual Activity   Alcohol use: No    Alcohol/week: 0.0 standard drinks of alcohol   Drug use: No   Sexual activity: Not Currently    Birth control/protection: Surgical, Post-menopausal    Comment: tubal  Other Topics Concern   Not on file  Social History Narrative   Not on file   Social Determinants of Health    Financial Resource Strain: Not on file  Food Insecurity: No Food Insecurity (12/05/2020)   Hunger Vital Sign    Worried About Running Out of Food in the Last Year: Never true    Ran Out of Food in the Last Year: Never true  Transportation Needs: No Transportation Needs (12/05/2020)   PRAPARE - Administrator, Civil Service (Medical): No    Lack of Transportation (Non-Medical): No  Physical Activity: Not on file  Stress: Not on file  Social Connections: Not on file  Intimate Partner Violence: Not on file     Review of Systems    General:  No chills, fever, night sweats or weight changes.  Cardiovascular:  No chest pain, dyspnea on exertion, edema, orthopnea, palpitations, paroxysmal nocturnal dyspnea. Dermatological: No rash, lesions/masses Respiratory: No cough, dyspnea Urologic: No hematuria, dysuria Abdominal:   No nausea, vomiting, diarrhea, bright red blood per rectum, melena, or hematemesis Neurologic:  No visual changes, wkns, changes in mental status. All other systems reviewed and are otherwise negative except as noted above.       Physical Exam    VS:  There were no vitals taken for this visit. , BMI There is no height or weight on file to calculate BMI.     GEN: Well nourished, well developed, in no acute distress. HEENT: normal. Neck: Supple, no JVD, carotid bruits, or masses. Cardiac: RRR, no murmurs, rubs, or gallops. No clubbing, cyanosis, edema.  Radials/DP/PT 2+ and equal bilaterally.  Respiratory:  Respirations regular and unlabored, clear to auscultation bilaterally. GI: Soft, nontender, nondistended, BS + x 4. MS: no deformity or atrophy. Skin: warm and dry, no rash. Neuro:  Strength and sensation are intact. Psych: Normal affect.      Lab Results  Component Value Date   WBC 5.2 11/10/2022   HGB 10.6 (L) 11/10/2022   HCT 34.1 (L) 11/10/2022   MCV 100.0 11/10/2022   PLT 79 (L) 11/10/2022   Lab Results  Component Value Date    CREATININE 0.71 11/10/2022   BUN 13 11/10/2022   NA 142 11/10/2022   K 3.8 11/10/2022   CL 105 11/10/2022   CO2 28 11/10/2022   Lab Results  Component Value Date   ALT 8 11/10/2022   AST 20 11/10/2022   ALKPHOS 49 11/10/2022   BILITOT 0.5 11/10/2022   Lab Results  Component Value Date   CHOL 133 02/25/2022   HDL 36 02/25/2022   LDLCALC 65 02/25/2022   TRIG 82 02/25/2022   CHOLHDL 7.1 09/03/2021    Lab Results  Component Value Date   HGBA1C 7.8 (H) 11/05/2021     Review of Prior Studies   Echocardiogram 01/27/2022  1. Left ventricular ejection fraction, by estimation, is 60 to 65%. The  left ventricle has normal function.   2. Right ventricular systolic function is normal.   3. There is no evidence of cardiac tamponade.   FINDINGS   Left Ventricle: Left ventricular ejection fraction, by estimation, is 60  to 65%. The left ventricle has normal function.   Right Ventricle: Right ventricular systolic function is normal.   Pericardium: Trivial pericardial effusion is present. There is no evidence  of cardiac tamponade.     Assessment & Plan   1.  ***     {Are you ordering a CV Procedure (e.g. stress test, cath, DCCV, TEE, etc)?   Press F2        :[id number]}   Signed, Bettey Mare. Liborio Nixon, ANP, AACC   08/02/2023 7:17 AM      Office [phone] Fax [phone]  Notice: This dictation was prepared with Dragon dictation along with smaller phrase technology. Any transcriptional errors that result from this process are unintentional and may not be corrected upon review.

## 2023-08-04 ENCOUNTER — Ambulatory Visit: Payer: PRIVATE HEALTH INSURANCE | Attending: Cardiology | Admitting: Adult Health

## 2023-09-02 ENCOUNTER — Emergency Department (HOSPITAL_COMMUNITY)
Admission: EM | Admit: 2023-09-02 | Discharge: 2023-09-02 | Disposition: A | Discharge status: Home / Self Care | Payer: Medicare Other | Attending: Emergency Medicine | Admitting: Emergency Medicine

## 2023-09-02 ENCOUNTER — Emergency Department (HOSPITAL_COMMUNITY): Payer: Medicare Other

## 2023-09-02 ENCOUNTER — Other Ambulatory Visit: Payer: Self-pay

## 2023-09-02 ENCOUNTER — Encounter (HOSPITAL_COMMUNITY): Payer: Self-pay

## 2023-09-02 DIAGNOSIS — Z79899 Other long term (current) drug therapy: Secondary | ICD-10-CM | POA: Diagnosis not present

## 2023-09-02 DIAGNOSIS — Z7951 Long term (current) use of inhaled steroids: Secondary | ICD-10-CM | POA: Diagnosis not present

## 2023-09-02 DIAGNOSIS — Z7982 Long term (current) use of aspirin: Secondary | ICD-10-CM | POA: Insufficient documentation

## 2023-09-02 DIAGNOSIS — J449 Chronic obstructive pulmonary disease, unspecified: Secondary | ICD-10-CM | POA: Insufficient documentation

## 2023-09-02 DIAGNOSIS — Z794 Long term (current) use of insulin: Secondary | ICD-10-CM | POA: Insufficient documentation

## 2023-09-02 DIAGNOSIS — R112 Nausea with vomiting, unspecified: Secondary | ICD-10-CM | POA: Insufficient documentation

## 2023-09-02 LAB — COMPREHENSIVE METABOLIC PANEL
ALT: 21 U/L (ref 0–44)
AST: 26 U/L (ref 15–41)
Albumin: 3.1 g/dL — ABNORMAL LOW (ref 3.5–5.0)
Alkaline Phosphatase: 87 U/L (ref 38–126)
Anion gap: 13 (ref 5–15)
BUN: 20 mg/dL (ref 8–23)
CO2: 29 mmol/L (ref 22–32)
Calcium: 9.7 mg/dL (ref 8.9–10.3)
Chloride: 98 mmol/L (ref 98–111)
Creatinine, Ser: 0.78 mg/dL (ref 0.44–1.00)
GFR, Estimated: >60 mL/min (ref >60)
Glucose, Bld: 177 mg/dL — ABNORMAL HIGH (ref 70–99)
Potassium: 3.9 mmol/L (ref 3.5–5.1)
Sodium: 140 mmol/L (ref 135–145)
Total Bilirubin: 0.6 mg/dL (ref 0.3–1.2)
Total Protein: 6 g/dL — ABNORMAL LOW (ref 6.5–8.1)

## 2023-09-02 LAB — I-STAT CHEM 8, ED
BUN: 20 mg/dL (ref 8–23)
Calcium, Ion: 1.26 mmol/L (ref 1.15–1.40)
Chloride: 98 mmol/L (ref 98–111)
Creatinine, Ser: 0.8 mg/dL (ref 0.44–1.00)
Glucose, Bld: 174 mg/dL — ABNORMAL HIGH (ref 70–99)
HCT: 38 % (ref 36.0–46.0)
Hemoglobin: 12.9 g/dL (ref 12.0–15.0)
Potassium: 3.9 mmol/L (ref 3.5–5.1)
Sodium: 142 mmol/L (ref 135–145)
TCO2: 31 mmol/L (ref 22–32)

## 2023-09-02 LAB — VALPROIC ACID LEVEL: Valproic Acid Lvl: 56 ug/mL (ref 50.0–100.0)

## 2023-09-02 LAB — CBC WITH DIFFERENTIAL/PLATELET
Abs Immature Granulocytes: 0.05 10*3/uL (ref 0.00–0.07)
Basophils Absolute: 0 10*3/uL (ref 0.0–0.1)
Basophils Relative: 0 %
Eosinophils Absolute: 0.1 10*3/uL (ref 0.0–0.5)
Eosinophils Relative: 1 %
HCT: 38.1 % (ref 36.0–46.0)
Hemoglobin: 12.6 g/dL (ref 12.0–15.0)
Immature Granulocytes: 0 %
Lymphocytes Relative: 11 %
Lymphs Abs: 1.7 10*3/uL (ref 0.7–4.0)
MCH: 31.7 pg (ref 26.0–34.0)
MCHC: 33.1 g/dL (ref 30.0–36.0)
MCV: 95.7 fL (ref 80.0–100.0)
Monocytes Absolute: 0.7 10*3/uL (ref 0.1–1.0)
Monocytes Relative: 4 %
Neutro Abs: 12.4 10*3/uL — ABNORMAL HIGH (ref 1.7–7.7)
Neutrophils Relative %: 84 %
Platelets: 229 10*3/uL (ref 150–400)
RBC: 3.98 MIL/uL (ref 3.87–5.11)
RDW: 16 % — ABNORMAL HIGH (ref 11.5–15.5)
WBC: 14.8 10*3/uL — ABNORMAL HIGH (ref 4.0–10.5)
nRBC: 0 % (ref 0.0–0.2)

## 2023-09-02 LAB — URINALYSIS, W/ REFLEX TO CULTURE (INFECTION SUSPECTED)
Bilirubin Urine: NEGATIVE
Glucose, UA: NEGATIVE mg/dL
Hgb urine dipstick: NEGATIVE
Ketones, ur: 5 mg/dL — AB
Leukocytes,Ua: NEGATIVE
Nitrite: NEGATIVE
Protein, ur: 30 mg/dL — AB
Specific Gravity, Urine: 1.014 (ref 1.005–1.030)
pH: 8 (ref 5.0–8.0)

## 2023-09-02 LAB — LIPASE, BLOOD: Lipase: 23 U/L (ref 11–51)

## 2023-09-02 LAB — I-STAT CG4 LACTIC ACID, ED: Lactic Acid, Venous: 1.3 mmol/L (ref 0.5–1.9)

## 2023-09-02 LAB — TROPONIN I (HIGH SENSITIVITY)
Troponin I (High Sensitivity): 9 ng/L (ref <18)
Troponin I (High Sensitivity): 11 ng/L (ref <18)

## 2023-09-02 MED ORDER — ONDANSETRON HCL 4 MG PO TABS: 4.0000 mg | ORAL_TABLET | Freq: Four times a day (QID) | ORAL | 0 refills | Status: DC

## 2023-09-02 MED ORDER — AMOXICILLIN-POT CLAVULANATE 875-125 MG PO TABS: 1.0000 | ORAL_TABLET | Freq: Two times a day (BID) | ORAL | 0 refills | Status: DC

## 2023-09-02 MED ORDER — ONDANSETRON HCL 4 MG/2ML IJ SOLN
4.0000 mg | Freq: Once | INTRAMUSCULAR | Status: AC
  Administered 2023-09-02: 4 mg via INTRAVENOUS
  Filled 2023-09-02: qty 2

## 2023-09-02 MED ORDER — SODIUM CHLORIDE 0.9 % IV BOLUS
500.0000 mL | Freq: Once | INTRAVENOUS | Status: AC
  Administered 2023-09-02: 500 mL via INTRAVENOUS

## 2023-09-02 MED ORDER — IOHEXOL 350 MG/ML SOLN
75.0000 mL | Freq: Once | INTRAVENOUS | Status: AC | PRN
  Administered 2023-09-02: 75 mL via INTRAVENOUS

## 2023-09-02 NOTE — Discharge Instructions (Addendum)
Return for any problem.  ?

## 2023-09-02 NOTE — ED Notes (Signed)
Called PTAR  ETA 1 HOUR

## 2023-09-02 NOTE — ED Provider Notes (Signed)
[place] EMERGENCY DEPARTMENT AT [facility] Provider Note   CSN: [id number] Arrival date & time: 09/02/23  0945     History  Chief Complaint  Patient presents with   Emesis    Claudia Keller is a 72 y.o. female.  72 year old female with prior medical history stated below presents from [facility] for evaluation.  Patient arrives by EMS transport.  Patient with nausea and vomiting that began overnight.  Patient was not given any medications prior to transport.  Patient denies associated chest pain, shortness of breath, abdominal pain.  Patient with history of COPD.  Patient with reported prior pulse ox is typically in the low 90s.  EMS reports that her pulse ox was approximately 88% this morning.  Supplemental 2 L nasal cannula O2 administered.  The history is provided by the patient and medical records.       Home Medications Prior to Admission medications   Medication Sig Start Date End Date Taking? Authorizing Provider  acetaminophen (TYLENOL) 325 MG tablet Take 650 mg by mouth every 8 (eight) hours as needed for mild pain or moderate pain.    [provider]  albuterol (VENTOLIN HFA) 108 (90 Base) MCG/ACT inhaler Inhale 2 puffs into the lungs every 4 (four) hours as needed for wheezing or shortness of breath. 12/03/20   Sherryll Burger, Pratik D, DO  allopurinol (ZYLOPRIM) 100 MG tablet Take 50 mg by mouth daily. Idiopathic gout    [provider]  alum & mag hydroxide-simeth (MAALOX/MYLANTA) 200-200-20 MG/5ML suspension Take 30 mLs by mouth every 2 (two) hours as needed for indigestion or heartburn.    [provider]  aspirin 81 MG chewable tablet Chew 81 mg by mouth daily.    [provider]  bisacodyl (DULCOLAX) 10 MG suppository Place 10 mg rectally daily as needed (constipation not relieved by MOM).    [provider]  Calcium Carb-Cholecalciferol (OYSTER SHELL CALCIUM W/D) 500-200 MG-UNIT TABS Take 1 tablet by mouth 2 (two)  times daily. 11/10/20   [provider]  colchicine 0.6 MG tablet Take 1 tablet (0.6 mg total) by mouth daily. 11/11/21 11/08/48  Marrion Coy, MD  colestipol (COLESTID) 1 g tablet TAKE 2 TABLETS DAILY FOR DIARRHEA. DO NOT TAKE WITHIN 2 HOURS OF OTHER ORAL MEDICATIONS. HOLD FOR CONSTIPATION. Patient taking differently: Take 2 g by mouth daily. TAKE 2 TABLETS DAILY FOR DIARRHEA. DO NOT TAKE WITHIN 2 HOURS OF OTHER ORAL MEDICATIONS. HOLD FOR CONSTIPATION. 05/23/20   Anice Paganini, NP  diltiazem (DILACOR XR) 180 MG 24 hr capsule Take 180 mg by mouth daily.    [provider]  divalproex (DEPAKOTE ER) 500 MG 24 hr tablet Take 2 tablets (1,000 mg total) by mouth at bedtime. 11/11/22   Lewie Chamber, MD  furosemide (LASIX) 20 MG tablet Take 1 tablet (20 mg total) by mouth every other day. 09/07/21   Johnson, Clanford L, MD  insulin aspart (NOVOLOG) 100 UNIT/ML injection Inject 5 Units into the skin 3 (three) times daily. For CBG >200    [provider]  insulin detemir (LEVEMIR) 100 UNIT/ML FlexPen Inject 8 Units into the skin daily. 11/10/22   Lewie Chamber, MD  levothyroxine (SYNTHROID) 150 MCG tablet Take 150 mcg by mouth daily. 11/26/22   [provider]  magnesium hydroxide (MILK OF MAGNESIA) 400 MG/5ML suspension Take 30 mLs by mouth daily as needed (if no BM in 3 days).    [provider]  memantine (NAMENDA) 10 MG tablet  Take 10 mg by mouth 2 (two) times daily.    [provider]  mirtazapine (REMERON) 15 MG tablet Take 15 mg by mouth at bedtime.    [provider]  Nutritional Supplements (NUTRITIONAL SUPPLEMENT PO) Take 1 each by mouth daily. Manufacturing engineer, Historical, MD  ondansetron (ZOFRAN) 4 MG tablet Take 1 tablet (4 mg total) by mouth every 8 (eight) hours as needed for nausea or vomiting. 10/17/18   Anice Paganini, NP  pantoprazole (PROTONIX) 40 MG tablet TAKE 1 TABLET BY MOUTH ONCE DAILY 30 MINTUES BEFORE BREAKFAST. Patient taking  differently: Take 40 mg by mouth daily before breakfast. TAKE 1 TABLET BY MOUTH ONCE DAILY 30 MINTUES BEFORE BREAKFAST. 05/23/20   Anice Paganini, NP  PARoxetine (PAXIL) 40 MG tablet Take 1 tablet (40 mg total) by mouth daily. 02/20/21   Neysa Hotter, MD  QUEtiapine (SEROQUEL XR) 200 MG 24 hr tablet Take 200 mg by mouth at bedtime.    [provider]  rosuvastatin (CRESTOR) 40 MG tablet Take 1 tablet (40 mg total) by mouth daily. 01/12/22   Sharlene Dory, PA-C  senna-docusate (SENOKOT-S) 8.6-50 MG tablet Take 1 tablet by mouth at bedtime as needed for mild constipation. 09/04/21   Johnson, Clanford L, MD  Sodium Phosphates (RA SALINE ENEMA RE) Place 1 Dose rectally once as needed (severe constipation). If no relief from both milk of magnesia, and Bisacodyl suppository, administer one dose of disposable saline enema.    [provider]  vitamin B-12 (CYANOCOBALAMIN) 500 MCG tablet Take 500 mcg by mouth daily.    [provider]      Allergies    Patient has no known allergies.    Review of Systems   Review of Systems  All other systems reviewed and are negative.   Physical Exam Updated Vital Signs BP (!) 149/86   Pulse (!) 119   Temp 98.2 F (36.8 C)   Resp 16   SpO2 (!) 87%  Physical Exam Vitals and nursing note reviewed.  Constitutional:      General: She is not in acute distress.    Appearance: Normal appearance. She is well-developed.  HENT:     Head: Normocephalic and atraumatic.  Eyes:     Conjunctiva/sclera: Conjunctivae normal.     Pupils: Pupils are equal, round, and reactive to light.  Cardiovascular:     Rate and Rhythm: Normal rate and regular rhythm.     Heart sounds: Normal heart sounds.  Pulmonary:     Effort: Pulmonary effort is normal. No respiratory distress.     Breath sounds: Normal breath sounds.  Abdominal:     General: There is no distension.     Palpations: Abdomen is soft.     Tenderness: There is no abdominal tenderness.   Musculoskeletal:        General: No deformity. Normal range of motion.     Cervical back: Normal range of motion and neck supple.  Skin:    General: Skin is warm and dry.  Neurological:     General: No focal deficit present.     Mental Status: She is alert and oriented to person, place, and time. Mental status is at baseline.     ED Results / Procedures / Treatments   Labs (all labs ordered are listed, but only abnormal results are displayed) Labs Reviewed  CBC WITH DIFFERENTIAL/PLATELET  COMPREHENSIVE METABOLIC PANEL  LIPASE, BLOOD  URINALYSIS, W/ REFLEX TO CULTURE (INFECTION  SUSPECTED)  VALPROIC ACID LEVEL  I-STAT CHEM 8, ED  I-STAT CG4 LACTIC ACID, ED  TROPONIN I (HIGH SENSITIVITY)    EKG None  Radiology No results found.  Procedures Procedures    Medications Ordered in ED Medications  sodium chloride 0.9 % bolus 500 mL (has no administration in time range)  ondansetron (ZOFRAN) injection 4 mg (has no administration in time range)    ED Course/ Medical Decision Making/ A&P                                 Medical Decision Making Amount and/or Complexity of Data Reviewed Labs: ordered. Radiology: ordered.  Risk Prescription drug management.    Medical Screen Complete  This patient presented to the ED with complaint of nausea and vomiting.  This complaint involves an extensive number of treatment options. The initial differential diagnosis includes, but is not limited to, gastroenteritis, metabolic abnormality, dehydration, aspiration, UTI, etc.  This presentation is: Acute, Self-Limited, Previously Undiagnosed, Uncertain Prognosis, Complicated, Systemic Symptoms, and Threat to Life/Bodily Function  Patient is presenting from her facility for evaluation of vomiting.  With administration of Zofran patient feels much improved.  Patient taking p.o. well prior to discharge.  Workup on the whole is reassuringly without significant abnormality.   Specifically, patient's labs do not suggest dehydration, metabolic abnormality.  Urine obtained with cath is suggestive of possible early UTI.  Patient is asymptomatic.  Patient is suggestive of possible early infiltrate in the left lower lung lobe.  Patient without symptoms suggestive of pneumonia.  This is an unlikely site for aspiration.  However a course of antibiotics to cover UTI and possible aspiration event appears warranted.  Patient is appropriate for discharge back to [facility].   Importance of close follow-up is stressed.  Strict return precautions given and understood.  Additional history obtained:  Additional history obtained from EMS External records from outside sources obtained and reviewed including prior ED visits and prior Inpatient records.    Lab Tests:  I ordered and personally interpreted labs.  The pertinent results include: CBC, CMP, valproic acid, urine, lactic acid, troponin   Imaging Studies ordered:  I ordered imaging studies including chest x-ray, CT abdomen pelvis I independently visualized and interpreted obtained imaging which showed NAD, possible early infiltrate in left lower lobe? I agree with the radiologist interpretation.   Cardiac Monitoring:  The patient was maintained on a cardiac monitor.  I personally viewed and interpreted the cardiac monitor which showed an underlying rhythm of: Sinus tach then NSR   Medicines ordered:  I ordered medication including Zofran, IV fluids for nausea Reevaluation of the patient after these medicines showed that the patient: resolved    Problem List / ED Course:  Nausea, vomiting   Reevaluation:  After the interventions noted above, I reevaluated the patient and found that they have: improved   Disposition:  After consideration of the diagnostic results and the patients response to treatment, I feel that the patent would benefit from close outpatient follow-up.          Final  Clinical Impression(s) / ED Diagnoses Final diagnoses:  Nausea and vomiting, unspecified vomiting type    Rx / DC Orders ED Discharge Orders          Ordered    ondansetron (ZOFRAN) 4 MG tablet  Every 6 hours        09/02/23 1428    amoxicillin-clavulanate (AUGMENTIN) 875-125  MG tablet  Every 12 hours        09/02/23 1428              Wynetta Fines, MD 09/02/23 1442

## 2023-09-02 NOTE — ED Triage Notes (Signed)
PTAR reports pt coming from [facility] for n/v that started last night and continued this am. Coffee ground per staff. Staff reports her pulse ox was 88%, EMS continued 02.

## 2023-09-02 NOTE — ED Notes (Signed)
PTAR at bedside 

## 2023-09-07 LAB — CULTURE, BLOOD (ROUTINE X 2): Culture: NO GROWTH

## 2023-10-02 ENCOUNTER — Emergency Department (HOSPITAL_COMMUNITY): Payer: Medicare Other

## 2023-10-02 ENCOUNTER — Emergency Department (HOSPITAL_COMMUNITY)
Admission: EM | Admit: 2023-10-02 | Discharge: 2023-10-02 | Disposition: A | Discharge status: Home / Self Care | Payer: Medicare Other | Attending: Emergency Medicine | Admitting: Emergency Medicine

## 2023-10-02 ENCOUNTER — Encounter (HOSPITAL_COMMUNITY): Payer: Self-pay

## 2023-10-02 ENCOUNTER — Other Ambulatory Visit: Payer: Self-pay

## 2023-10-02 DIAGNOSIS — N390 Urinary tract infection, site not specified: Secondary | ICD-10-CM

## 2023-10-02 DIAGNOSIS — R112 Nausea with vomiting, unspecified: Secondary | ICD-10-CM

## 2023-10-02 LAB — CBC WITH DIFFERENTIAL/PLATELET
Abs Immature Granulocytes: 0.05 10*3/uL (ref 0.00–0.07)
Basophils Absolute: 0 10*3/uL (ref 0.0–0.1)
Basophils Relative: 0 %
Eosinophils Absolute: 0 10*3/uL (ref 0.0–0.5)
Eosinophils Relative: 0 %
HCT: 36.8 % (ref 36.0–46.0)
Hemoglobin: 11.8 g/dL — ABNORMAL LOW (ref 12.0–15.0)
Immature Granulocytes: 1 %
Lymphocytes Relative: 18 %
Lymphs Abs: 1.2 10*3/uL (ref 0.7–4.0)
MCH: 30.6 pg (ref 26.0–34.0)
MCHC: 32.1 g/dL (ref 30.0–36.0)
MCV: 95.3 fL (ref 80.0–100.0)
Monocytes Absolute: 0.7 10*3/uL (ref 0.1–1.0)
Monocytes Relative: 10 %
Neutro Abs: 4.9 10*3/uL (ref 1.7–7.7)
Neutrophils Relative %: 71 %
Platelets: 144 10*3/uL — ABNORMAL LOW (ref 150–400)
RBC: 3.86 MIL/uL — ABNORMAL LOW (ref 3.87–5.11)
RDW: 15.1 % (ref 11.5–15.5)
WBC: 6.9 10*3/uL (ref 4.0–10.5)
nRBC: 0 % (ref 0.0–0.2)

## 2023-10-02 LAB — COMPREHENSIVE METABOLIC PANEL
ALT: 16 U/L (ref 0–44)
AST: 20 U/L (ref 15–41)
Albumin: 3 g/dL — ABNORMAL LOW (ref 3.5–5.0)
Alkaline Phosphatase: 98 U/L (ref 38–126)
Anion gap: 11 (ref 5–15)
BUN: 15 mg/dL (ref 8–23)
CO2: 28 mmol/L (ref 22–32)
Calcium: 9.9 mg/dL (ref 8.9–10.3)
Chloride: 102 mmol/L (ref 98–111)
Creatinine, Ser: 0.8 mg/dL (ref 0.44–1.00)
GFR, Estimated: >60 mL/min (ref >60)
Glucose, Bld: 180 mg/dL — ABNORMAL HIGH (ref 70–99)
Potassium: 4 mmol/L (ref 3.5–5.1)
Sodium: 141 mmol/L (ref 135–145)
Total Bilirubin: 0.7 mg/dL (ref <1.2)
Total Protein: 5.8 g/dL — ABNORMAL LOW (ref 6.5–8.1)

## 2023-10-02 LAB — URINALYSIS, ROUTINE W REFLEX MICROSCOPIC
Bilirubin Urine: NEGATIVE
Glucose, UA: NEGATIVE mg/dL
Hgb urine dipstick: NEGATIVE
Ketones, ur: NEGATIVE mg/dL
Leukocytes,Ua: NEGATIVE
Nitrite: NEGATIVE
Protein, ur: 30 mg/dL — AB
Specific Gravity, Urine: 1.035 — ABNORMAL HIGH (ref 1.005–1.030)
pH: 9 — ABNORMAL HIGH (ref 5.0–8.0)

## 2023-10-02 LAB — LIPASE, BLOOD: Lipase: 26 U/L (ref 11–51)

## 2023-10-02 MED ORDER — ONDANSETRON HCL 4 MG PO TABS: 4.0000 mg | ORAL_TABLET | Freq: Four times a day (QID) | ORAL | 0 refills | Status: DC | PRN

## 2023-10-02 MED ORDER — LACTATED RINGERS IV BOLUS
1000.0000 mL | Freq: Once | INTRAVENOUS | Status: AC
  Administered 2023-10-02: 1000 mL via INTRAVENOUS

## 2023-10-02 MED ORDER — IOHEXOL 350 MG/ML SOLN
75.0000 mL | Freq: Once | INTRAVENOUS | Status: AC | PRN
  Administered 2023-10-02: 75 mL via INTRAVENOUS

## 2023-10-02 MED ORDER — CEPHALEXIN 500 MG PO CAPS: 500.0000 mg | ORAL_CAPSULE | Freq: Four times a day (QID) | ORAL | 0 refills | Status: DC

## 2023-10-02 NOTE — ED Notes (Signed)
Attempted to give transfer of care report.

## 2023-10-02 NOTE — ED Triage Notes (Signed)
Pt BIB EMS from [place] with c/o vomiting x2hrs. Staff reported to EMS pt was given zofran x2 without relief. Emesis is dark brown and staff also reported possible constipation. VSS en route, EMS denies vomiting en route.

## 2023-10-02 NOTE — ED Notes (Signed)
Pt back from CT, well appearing

## 2023-10-02 NOTE — ED Notes (Signed)
Attempted to call to give report to [facility].

## 2023-10-02 NOTE — ED Provider Notes (Signed)
  Physical Exam  BP (!) 107/49   Pulse 92   Temp 98.4 F (36.9 C) (Oral)   Resp 18   Ht 5\' 4"  (1.626 m)   Wt 54.5 kg   SpO2 94%   BMI 20.62 kg/m   Physical Exam Vitals and nursing note reviewed.  Constitutional:      General: She is not in acute distress.    Appearance: She is not toxic-appearing.  HENT:     Head: Normocephalic and atraumatic.  Pulmonary:     Effort: No respiratory distress.  Abdominal:     Tenderness: There is no abdominal tenderness.     Comments: No abdominal tenderness noted  Skin:    Coloration: Skin is not jaundiced or pale.  Neurological:     Mental Status: She is alert and oriented to person, place, and time.  Psychiatric:        Behavior: Behavior normal.     Procedures  Procedures  ED Course / MDM   Clinical Course as of 10/02/23 1257  Sun Oct 02, 2023  1610 N/V, fairly dark vomit, no hematemesis. No AC. Similar appearance 1 month ago, UTI. Getting fluids. Labs and CT results, follow up. [CG]    Clinical Course User Index [CG] Al Decant, PA-C   Medical Decision Making Amount and/or Complexity of Data Reviewed Labs: ordered. Radiology: ordered.  Risk Prescription drug management.   72 year old female signed out at shift change pending CT abdomen pelvis, labs.  Please see HPI for further details.  In short, 72 year old female who presents for nausea and vomiting.  Patient had similar presentation 1 month ago and was diagnosed with UTI.  There was concern for dark vomit however previous provider notes no hematemesis.  Patient denies any pending labs, imaging.  CT abdomen pelvis shows no acute process.  Labs reassuring.  CMP with glucose to 180.  Urinalysis shows many bacteria, protein.  Will culture urine.  CBC without leukocytosis, baseline hemoglobin.  Patient was able to pass p.o. fluid challenge.  She has had no nausea or vomiting here.  Patient was diagnosed with UTI with similar presentation 1 month ago.  Will send  patient home with Keflex for suspected UTI.  Will culture the patient's urine.  She will be sent with paper prescriptions per direction of nurse at [facility].  Advised to return for any new or worsening symptoms.  Stable to discharge.     Al Decant, PA-C 10/02/23 1258    Elayne Snare K, DO 10/02/23 1610

## 2023-10-02 NOTE — ED Notes (Signed)
This RN assumed care of patient and received off going transfer of care report from off going RN. Pt presented to the ED with emesis unrelieved by zofran from SNF, [facility]. Pt is resting on gurney at this time, respirations are spontaneous, even, unlabored and symmetrical bilaterally. Pt skin tone is appropriate for ethnicity, dry and warm. Pt connected to pulse ox and BP. Call bell within reach and bed in lowest position.

## 2023-10-02 NOTE — ED Notes (Signed)
Pt kept water down and reports improved N/V.

## 2023-10-02 NOTE — Discharge Instructions (Addendum)
Please been taking Keflex 4 times a day for the next 7 days.  Please take Zofran every 6 hours as needed for nausea and vomiting.  Return to ED with any new or worsening symptoms.

## 2023-10-02 NOTE — ED Provider Notes (Signed)
  [facility]ENC[facility] DEPT [facility] Emergency Department Provider Note MRN:  [id number]  Arrival date & time: 10/02/23     Chief Complaint   Emesis   History of Present Illness   Claudia Keller is a 72 y.o. year-old female presents to the ED with chief complaint of nausea and vomiting.  BIB EMS from [facility] with vomiting for the past 2 hours.  EMS gave zofran without relief.  Patient has had some brown vomiting.  She states she has had some cramping of her stomach.  She denies any dark or bloody stools.  She denies any recent illnesses.  She states that she doesn't take a blood thinner.  History provided by patient.   Review of Systems  Pertinent positive and negative review of systems noted in HPI.    Physical Exam   Vitals:   10/02/23 0501 10/02/23 0503  BP:  (!) 131/54  Pulse:  (!) 106  Resp:  16  Temp:  99 F (37.2 C)  SpO2: 97% 93%    CONSTITUTIONAL:  dry-appearing, NAD NEURO:  Alert and oriented x 3, CN 3-12 grossly intact EYES:  eyes equal and reactive ENT/NECK:  Supple, no stridor, dry mucous membranes CARDIO:  normal rate, regular rhythm, appears well-perfused  PULM:  No respiratory distress, CTAB GI/GU:  non-distended, no focal tenderness on exam MSK/SPINE:  No gross deformities, no edema, moves all extremities  SKIN:  no rash, atraumatic   *Additional and/or pertinent findings included in MDM below  Diagnostic and Interventional Summary    EKG Interpretation Date/Time:    Ventricular Rate:    PR Interval:    QRS Duration:    QT Interval:    QTC Calculation:   R Axis:      Text Interpretation:         Labs Reviewed  COMPREHENSIVE METABOLIC PANEL  LIPASE, BLOOD  CBC WITH DIFFERENTIAL/PLATELET  URINALYSIS, ROUTINE W REFLEX MICROSCOPIC    CT ABDOMEN PELVIS W CONTRAST    (Results Pending)    Medications  lactated ringers bolus 1,000 mL (has no administration in time range)     Procedures  /  Critical Care Procedures  ED Course and  Medical Decision Making  I have reviewed the triage vital signs, the nursing notes, and pertinent available records from the EMR.  Social Determinants Affecting Complexity of Care: Patient has no clinically significant social determinants affecting this chief complaint..   ED Course:    Medical Decision Making Amount and/or Complexity of Data Reviewed Labs: ordered. Radiology: ordered.         Consultants:    Treatment and Plan: Patient signed out oncoming team.   Plan: Follow-up on CT and labs.  Had similar symptoms about a month ago and was treated for UTI.    Final Clinical Impressions(s) / ED Diagnoses  No diagnosis found.  ED Discharge Orders     None         Discharge Instructions Discussed with and Provided to Patient:   Discharge Instructions   None      Roxy Horseman, PA-C 10/02/23 0617    Nira Conn, MD 10/02/23 [phone]

## 2023-10-05 LAB — URINE CULTURE: Culture: >=100000 — AB

## 2023-10-06 NOTE — Progress Notes (Signed)
ED Antimicrobial Stewardship Positive Culture Follow Up   KEYONAH LUERS is an 72 y.o. female who presented to [facility] on 10/02/2023 with a chief complaint of  Chief Complaint  Patient presents with   Emesis    Recent Results (from the past 720 hour(s))  Urine Culture     Status: Abnormal   Collection Time: 10/02/23 11:56 AM   Specimen: Urine, Clean Catch  Result Value Ref Range Status   Specimen Description URINE, CLEAN CATCH  Final   Special Requests   Final    NONE Performed at [facility], [address] [place], [place] [address]    Culture (A)  Final    >=100,000 COLONIES/mL CITROBACTER FREUNDII >=100,000 COLONIES/mL PROTEUS MIRABILIS    Report Status 10/05/2023 FINAL  Final   Organism ID, Bacteria CITROBACTER FREUNDII (A)  Final   Organism ID, Bacteria PROTEUS MIRABILIS (A)  Final      Susceptibility   Citrobacter freundii - MIC*    CEFEPIME <=0.12 SENSITIVE Sensitive     CEFTRIAXONE >=64 RESISTANT Resistant     CIPROFLOXACIN <=0.25 SENSITIVE Sensitive     GENTAMICIN <=1 SENSITIVE Sensitive     IMIPENEM 1 SENSITIVE Sensitive     NITROFURANTOIN <=16 SENSITIVE Sensitive     TRIMETH/SULFA <=20 SENSITIVE Sensitive     PIP/TAZO 16 SENSITIVE Sensitive ug/mL    * >=100,000 COLONIES/mL CITROBACTER FREUNDII   Proteus mirabilis - MIC*    AMPICILLIN <=2 SENSITIVE Sensitive     CEFAZOLIN <=4 SENSITIVE Sensitive     CEFEPIME <=0.12 SENSITIVE Sensitive     CEFTRIAXONE <=0.25 SENSITIVE Sensitive     CIPROFLOXACIN <=0.25 SENSITIVE Sensitive     GENTAMICIN <=1 SENSITIVE Sensitive     IMIPENEM 2 SENSITIVE Sensitive     NITROFURANTOIN 128 RESISTANT Resistant     TRIMETH/SULFA <=20 SENSITIVE Sensitive     AMPICILLIN/SULBACTAM <=2 SENSITIVE Sensitive     PIP/TAZO <=4 SENSITIVE Sensitive ug/mL    * >=100,000 COLONIES/mL PROTEUS MIRABILIS    72 y/o patient who presented with brown N/V and stomach cramping. CT negative for acute inflammatory process. UA with many  bacteria, 0-5 WBC, negative for nitrites and leukocytes. Of note patient presented with a similar presentation about a month ago and was treated for a UTI. Urine cultures now positive for proteus mirabilis and citrobacter freundii both sensitive to Bactrim. As we cannot rule out UTI, will go ahead and treat.   New antibiotic prescription: Sulfamethoxazole/trimethoprim 800/160 mg 1 tablet twice daily x 5 days  ED Provider: Rolla Flatten, MD   Griffin Dakin 10/06/2023, 7:31 AM Clinical Pharmacist Monday - Friday phone -  [phone] Saturday - Sunday phone - [phone]

## 2023-10-07 ENCOUNTER — Telehealth (HOSPITAL_BASED_OUTPATIENT_CLINIC_OR_DEPARTMENT_OTHER): Payer: Self-pay | Admitting: *Deleted

## 2023-10-07 NOTE — Telephone Encounter (Signed)
Post ED Visit - Positive Culture Follow-up: Successful Patient Follow-Up  Culture assessed and recommendations reviewed by:  []  Enzo Bi, Pharm.D. []  Celedonio Miyamoto, [address].D., BCPS AQ-ID []  Garvin Fila, Pharm.D., BCPS []  Georgina Pillion, Pharm.D., BCPS []  [place], [place].D., BCPS, AAHIVP []  Estella Husk, Pharm.D., BCPS, AAHIVP []  Lysle Pearl, PharmD, BCPS []  Phillips Climes, PharmD, BCPS []  Agapito Games, PharmD, BCPS []  Verlan Friends, PharmD  Positive urine culture  []  Patient discharged without antimicrobial prescription and treatment is now indicated [x]  Organism is resistant to prescribed ED discharge antimicrobial []  Patient with positive blood cultures  Changes discussed with ED provider: Rolla Flatten, MD New antibiotic prescription Bactrim DS 1 tab BID x 5 days Faxed to [facility] [phone](fax#)  Jasmine at [place], date 10/07/2023, time 1030   Lysle Pearl 10/07/2023, 10:27 AM

## 2023-11-27 ENCOUNTER — Encounter (HOSPITAL_COMMUNITY): Payer: Self-pay

## 2023-11-27 ENCOUNTER — Observation Stay (HOSPITAL_COMMUNITY)
Admission: EM | Admit: 2023-11-27 | Discharge: 2023-11-29 | Disposition: A | Discharge status: Skilled Nursing Facility | Payer: Medicare Other | Attending: Internal Medicine | Admitting: Internal Medicine

## 2023-11-27 ENCOUNTER — Other Ambulatory Visit: Payer: Self-pay

## 2023-11-27 ENCOUNTER — Emergency Department (HOSPITAL_COMMUNITY): Payer: Medicare Other

## 2023-11-27 DIAGNOSIS — K589 Irritable bowel syndrome without diarrhea: Secondary | ICD-10-CM | POA: Diagnosis not present

## 2023-11-27 DIAGNOSIS — F332 Major depressive disorder, recurrent severe without psychotic features: Secondary | ICD-10-CM | POA: Diagnosis present

## 2023-11-27 DIAGNOSIS — I480 Paroxysmal atrial fibrillation: Secondary | ICD-10-CM | POA: Diagnosis present

## 2023-11-27 DIAGNOSIS — E039 Hypothyroidism, unspecified: Secondary | ICD-10-CM | POA: Diagnosis not present

## 2023-11-27 DIAGNOSIS — Z794 Long term (current) use of insulin: Secondary | ICD-10-CM | POA: Diagnosis not present

## 2023-11-27 DIAGNOSIS — Z860101 Personal history of adenomatous and serrated colon polyps: Secondary | ICD-10-CM | POA: Diagnosis not present

## 2023-11-27 DIAGNOSIS — I130 Hypertensive heart and chronic kidney disease with heart failure and stage 1 through stage 4 chronic kidney disease, or unspecified chronic kidney disease: Secondary | ICD-10-CM | POA: Diagnosis not present

## 2023-11-27 DIAGNOSIS — E785 Hyperlipidemia, unspecified: Secondary | ICD-10-CM | POA: Diagnosis present

## 2023-11-27 DIAGNOSIS — R9431 Abnormal electrocardiogram [ECG] [EKG]: Secondary | ICD-10-CM | POA: Diagnosis not present

## 2023-11-27 DIAGNOSIS — Z6830 Body mass index (BMI) 30.0-30.9, adult: Secondary | ICD-10-CM | POA: Diagnosis not present

## 2023-11-27 DIAGNOSIS — I5030 Unspecified diastolic (congestive) heart failure: Secondary | ICD-10-CM | POA: Diagnosis present

## 2023-11-27 DIAGNOSIS — E1122 Type 2 diabetes mellitus with diabetic chronic kidney disease: Secondary | ICD-10-CM | POA: Diagnosis not present

## 2023-11-27 DIAGNOSIS — Z8673 Personal history of transient ischemic attack (TIA), and cerebral infarction without residual deficits: Secondary | ICD-10-CM | POA: Diagnosis not present

## 2023-11-27 DIAGNOSIS — F4310 Post-traumatic stress disorder, unspecified: Secondary | ICD-10-CM | POA: Diagnosis present

## 2023-11-27 DIAGNOSIS — Z7982 Long term (current) use of aspirin: Secondary | ICD-10-CM | POA: Insufficient documentation

## 2023-11-27 DIAGNOSIS — K581 Irritable bowel syndrome with constipation: Secondary | ICD-10-CM | POA: Diagnosis not present

## 2023-11-27 DIAGNOSIS — F3189 Other bipolar disorder: Secondary | ICD-10-CM | POA: Diagnosis not present

## 2023-11-27 DIAGNOSIS — I4581 Long QT syndrome: Secondary | ICD-10-CM | POA: Insufficient documentation

## 2023-11-27 DIAGNOSIS — K920 Hematemesis: Secondary | ICD-10-CM | POA: Diagnosis not present

## 2023-11-27 DIAGNOSIS — E119 Type 2 diabetes mellitus without complications: Secondary | ICD-10-CM | POA: Diagnosis present

## 2023-11-27 DIAGNOSIS — I5032 Chronic diastolic (congestive) heart failure: Secondary | ICD-10-CM | POA: Diagnosis not present

## 2023-11-27 DIAGNOSIS — K449 Diaphragmatic hernia without obstruction or gangrene: Secondary | ICD-10-CM | POA: Insufficient documentation

## 2023-11-27 DIAGNOSIS — F319 Bipolar disorder, unspecified: Secondary | ICD-10-CM | POA: Diagnosis present

## 2023-11-27 DIAGNOSIS — N1831 Chronic kidney disease, stage 3a: Secondary | ICD-10-CM | POA: Diagnosis not present

## 2023-11-27 DIAGNOSIS — Z79899 Other long term (current) drug therapy: Secondary | ICD-10-CM | POA: Insufficient documentation

## 2023-11-27 DIAGNOSIS — N183 Chronic kidney disease, stage 3 unspecified: Secondary | ICD-10-CM | POA: Diagnosis present

## 2023-11-27 DIAGNOSIS — E669 Obesity, unspecified: Secondary | ICD-10-CM | POA: Diagnosis present

## 2023-11-27 DIAGNOSIS — Z8739 Personal history of other diseases of the musculoskeletal system and connective tissue: Secondary | ICD-10-CM

## 2023-11-27 DIAGNOSIS — I5033 Acute on chronic diastolic (congestive) heart failure: Secondary | ICD-10-CM

## 2023-11-27 DIAGNOSIS — I129 Hypertensive chronic kidney disease with stage 1 through stage 4 chronic kidney disease, or unspecified chronic kidney disease: Secondary | ICD-10-CM

## 2023-11-27 DIAGNOSIS — F419 Anxiety disorder, unspecified: Secondary | ICD-10-CM | POA: Diagnosis present

## 2023-11-27 DIAGNOSIS — K2100 Gastro-esophageal reflux disease with esophagitis, without bleeding: Secondary | ICD-10-CM | POA: Insufficient documentation

## 2023-11-27 DIAGNOSIS — F17210 Nicotine dependence, cigarettes, uncomplicated: Secondary | ICD-10-CM | POA: Diagnosis not present

## 2023-11-27 DIAGNOSIS — R112 Nausea with vomiting, unspecified: Principal | ICD-10-CM

## 2023-11-27 DIAGNOSIS — F0390 Unspecified dementia without behavioral disturbance: Secondary | ICD-10-CM | POA: Diagnosis present

## 2023-11-27 DIAGNOSIS — K2970 Gastritis, unspecified, without bleeding: Secondary | ICD-10-CM | POA: Diagnosis not present

## 2023-11-27 DIAGNOSIS — N182 Chronic kidney disease, stage 2 (mild): Secondary | ICD-10-CM

## 2023-11-27 DIAGNOSIS — K219 Gastro-esophageal reflux disease without esophagitis: Secondary | ICD-10-CM | POA: Diagnosis present

## 2023-11-27 DIAGNOSIS — J449 Chronic obstructive pulmonary disease, unspecified: Secondary | ICD-10-CM | POA: Diagnosis not present

## 2023-11-27 DIAGNOSIS — K580 Irritable bowel syndrome with diarrhea: Secondary | ICD-10-CM

## 2023-11-27 DIAGNOSIS — Z7985 Long-term (current) use of injectable non-insulin antidiabetic drugs: Secondary | ICD-10-CM | POA: Insufficient documentation

## 2023-11-27 DIAGNOSIS — I10 Essential (primary) hypertension: Secondary | ICD-10-CM | POA: Diagnosis present

## 2023-11-27 LAB — TYPE AND SCREEN
ABO/RH(D): O POS
Antibody Screen: NEGATIVE

## 2023-11-27 LAB — URINALYSIS, W/ REFLEX TO CULTURE (INFECTION SUSPECTED)
Bilirubin Urine: NEGATIVE
Glucose, UA: NEGATIVE mg/dL
Hgb urine dipstick: NEGATIVE
Ketones, ur: NEGATIVE mg/dL
Leukocytes,Ua: NEGATIVE
Nitrite: NEGATIVE
Protein, ur: 30 mg/dL — AB
Specific Gravity, Urine: 1.021 (ref 1.005–1.030)
pH: 8 (ref 5.0–8.0)

## 2023-11-27 LAB — CBC WITH DIFFERENTIAL/PLATELET
Abs Immature Granulocytes: 0.03 10*3/uL (ref 0.00–0.07)
Basophils Absolute: 0 10*3/uL (ref 0.0–0.1)
Basophils Relative: 0 %
Eosinophils Absolute: 0.1 10*3/uL (ref 0.0–0.5)
Eosinophils Relative: 1 %
HCT: 38.1 % (ref 36.0–46.0)
Hemoglobin: 12.4 g/dL (ref 12.0–15.0)
Immature Granulocytes: 0 %
Lymphocytes Relative: 18 %
Lymphs Abs: 1.9 10*3/uL (ref 0.7–4.0)
MCH: 30.8 pg (ref 26.0–34.0)
MCHC: 32.5 g/dL (ref 30.0–36.0)
MCV: 94.8 fL (ref 80.0–100.0)
Monocytes Absolute: 0.6 10*3/uL (ref 0.1–1.0)
Monocytes Relative: 5 %
Neutro Abs: 7.6 10*3/uL (ref 1.7–7.7)
Neutrophils Relative %: 76 %
Platelets: 232 10*3/uL (ref 150–400)
RBC: 4.02 MIL/uL (ref 3.87–5.11)
RDW: 14.6 % (ref 11.5–15.5)
WBC: 10.2 10*3/uL (ref 4.0–10.5)
nRBC: 0 % (ref 0.0–0.2)

## 2023-11-27 LAB — COMPREHENSIVE METABOLIC PANEL
ALT: 27 U/L (ref 0–44)
AST: 25 U/L (ref 15–41)
Albumin: 3.2 g/dL — ABNORMAL LOW (ref 3.5–5.0)
Alkaline Phosphatase: 87 U/L (ref 38–126)
Anion gap: 14 (ref 5–15)
BUN: 17 mg/dL (ref 8–23)
CO2: 29 mmol/L (ref 22–32)
Calcium: 9.8 mg/dL (ref 8.9–10.3)
Chloride: 98 mmol/L (ref 98–111)
Creatinine, Ser: 0.99 mg/dL (ref 0.44–1.00)
GFR, Estimated: >60 mL/min (ref >60)
Glucose, Bld: 220 mg/dL — ABNORMAL HIGH (ref 70–99)
Potassium: 4.2 mmol/L (ref 3.5–5.1)
Sodium: 141 mmol/L (ref 135–145)
Total Bilirubin: 0.6 mg/dL (ref 0.0–1.2)
Total Protein: 5.9 g/dL — ABNORMAL LOW (ref 6.5–8.1)

## 2023-11-27 LAB — I-STAT CHEM 8, ED
BUN: 19 mg/dL (ref 8–23)
Calcium, Ion: 1.18 mmol/L (ref 1.15–1.40)
Chloride: 101 mmol/L (ref 98–111)
Creatinine, Ser: 1 mg/dL (ref 0.44–1.00)
Glucose, Bld: 221 mg/dL — ABNORMAL HIGH (ref 70–99)
HCT: 37 % (ref 36.0–46.0)
Hemoglobin: 12.6 g/dL (ref 12.0–15.0)
Potassium: 4.1 mmol/L (ref 3.5–5.1)
Sodium: 142 mmol/L (ref 135–145)
TCO2: 37 mmol/L — ABNORMAL HIGH (ref 22–32)

## 2023-11-27 LAB — PROTIME-INR
INR: 1 (ref 0.8–1.2)
Prothrombin Time: 13.5 s (ref 11.4–15.2)

## 2023-11-27 LAB — I-STAT CG4 LACTIC ACID, ED: Lactic Acid, Venous: 2.1 mmol/L (ref 0.5–1.9)

## 2023-11-27 LAB — APTT: aPTT: 28 s (ref 24–36)

## 2023-11-27 LAB — HEMOGLOBIN A1C
Hgb A1c MFr Bld: 6.8 % — ABNORMAL HIGH (ref 4.8–5.6)
Mean Plasma Glucose: 148.46 mg/dL

## 2023-11-27 LAB — CBG MONITORING, ED
Glucose-Capillary: 133 mg/dL — ABNORMAL HIGH (ref 70–99)
Glucose-Capillary: 137 mg/dL — ABNORMAL HIGH (ref 70–99)

## 2023-11-27 LAB — CBC
HCT: 33.6 % — ABNORMAL LOW (ref 36.0–46.0)
Hemoglobin: 10.4 g/dL — ABNORMAL LOW (ref 12.0–15.0)
MCH: 31.1 pg (ref 26.0–34.0)
MCHC: 31 g/dL (ref 30.0–36.0)
MCV: 100.6 fL — ABNORMAL HIGH (ref 80.0–100.0)
Platelets: 180 10*3/uL (ref 150–400)
RBC: 3.34 MIL/uL — ABNORMAL LOW (ref 3.87–5.11)
RDW: 14.7 % (ref 11.5–15.5)
WBC: 13.2 10*3/uL — ABNORMAL HIGH (ref 4.0–10.5)
nRBC: 0 % (ref 0.0–0.2)

## 2023-11-27 MED ORDER — LACTATED RINGERS IV BOLUS
1000.0000 mL | Freq: Once | INTRAVENOUS | Status: AC
  Administered 2023-11-27: 1000 mL via INTRAVENOUS

## 2023-11-27 MED ORDER — SODIUM CHLORIDE 0.9% FLUSH
3.0000 mL | Freq: Two times a day (BID) | INTRAVENOUS | Status: DC
  Administered 2023-11-27 – 2023-11-29 (×4): 3 mL via INTRAVENOUS

## 2023-11-27 MED ORDER — PANTOPRAZOLE SODIUM 40 MG IV SOLR
40.0000 mg | Freq: Once | INTRAVENOUS | Status: AC
  Administered 2023-11-27: 40 mg via INTRAVENOUS
  Filled 2023-11-27: qty 10

## 2023-11-27 MED ORDER — PANTOPRAZOLE SODIUM 40 MG IV SOLR
40.0000 mg | Freq: Two times a day (BID) | INTRAVENOUS | Status: DC
  Administered 2023-11-27 – 2023-11-28 (×2): 40 mg via INTRAVENOUS
  Filled 2023-11-27 (×2): qty 10

## 2023-11-27 MED ORDER — LEVOTHYROXINE SODIUM 25 MCG PO TABS
125.0000 ug | ORAL_TABLET | Freq: Every day | ORAL | Status: DC
  Administered 2023-11-28 – 2023-11-29 (×2): 125 ug via ORAL
  Filled 2023-11-27 (×2): qty 1

## 2023-11-27 MED ORDER — DILTIAZEM HCL ER COATED BEADS 180 MG PO CP24
180.0000 mg | ORAL_CAPSULE | Freq: Every day | ORAL | Status: DC
  Administered 2023-11-28 – 2023-11-29 (×2): 180 mg via ORAL
  Filled 2023-11-27 (×2): qty 1

## 2023-11-27 MED ORDER — DIVALPROEX SODIUM ER 500 MG PO TB24
1000.0000 mg | ORAL_TABLET | Freq: Every day | ORAL | Status: DC
  Administered 2023-11-27 – 2023-11-28 (×2): 1000 mg via ORAL
  Filled 2023-11-27 (×2): qty 2

## 2023-11-27 MED ORDER — ROSUVASTATIN CALCIUM 20 MG PO TABS
40.0000 mg | ORAL_TABLET | Freq: Every day | ORAL | Status: DC
  Administered 2023-11-28 – 2023-11-29 (×2): 40 mg via ORAL
  Filled 2023-11-27 (×2): qty 2

## 2023-11-27 MED ORDER — MEMANTINE HCL 10 MG PO TABS
10.0000 mg | ORAL_TABLET | Freq: Two times a day (BID) | ORAL | Status: DC
  Administered 2023-11-27 – 2023-11-29 (×4): 10 mg via ORAL
  Filled 2023-11-27 (×4): qty 1

## 2023-11-27 MED ORDER — ONDANSETRON HCL 4 MG/2ML IJ SOLN
4.0000 mg | Freq: Three times a day (TID) | INTRAMUSCULAR | Status: DC | PRN
  Administered 2023-11-27: 4 mg via INTRAVENOUS
  Filled 2023-11-27: qty 2

## 2023-11-27 MED ORDER — INSULIN ASPART 100 UNIT/ML IJ SOLN
0.0000 [IU] | INTRAMUSCULAR | Status: DC
  Administered 2023-11-27: 2 [IU] via SUBCUTANEOUS
  Administered 2023-11-28: 3 [IU] via SUBCUTANEOUS
  Administered 2023-11-28: 2 [IU] via SUBCUTANEOUS
  Administered 2023-11-28: 3 [IU] via SUBCUTANEOUS
  Administered 2023-11-29 (×2): 2 [IU] via SUBCUTANEOUS
  Administered 2023-11-29: 3 [IU] via SUBCUTANEOUS

## 2023-11-27 MED ORDER — PAROXETINE HCL 20 MG PO TABS
40.0000 mg | ORAL_TABLET | Freq: Every day | ORAL | Status: DC
Start: 2023-11-28 — End: 2023-11-29
  Administered 2023-11-28 – 2023-11-29 (×2): 40 mg via ORAL
  Filled 2023-11-27 (×2): qty 2

## 2023-11-27 MED ORDER — ACETAMINOPHEN 325 MG PO TABS: 650.0000 mg | ORAL_TABLET | Freq: Four times a day (QID) | ORAL | Status: DC | PRN

## 2023-11-27 MED ORDER — ONDANSETRON HCL 4 MG/2ML IJ SOLN
4.0000 mg | Freq: Once | INTRAMUSCULAR | Status: AC
  Administered 2023-11-27: 4 mg via INTRAVENOUS
  Filled 2023-11-27: qty 2

## 2023-11-27 MED ORDER — ACETAMINOPHEN 650 MG RE SUPP: 650.0000 mg | Freq: Four times a day (QID) | RECTAL | Status: DC | PRN

## 2023-11-27 MED ORDER — MIRTAZAPINE 15 MG PO TABS
15.0000 mg | ORAL_TABLET | Freq: Every day | ORAL | Status: DC
  Administered 2023-11-27 – 2023-11-28 (×2): 15 mg via ORAL
  Filled 2023-11-27 (×2): qty 1

## 2023-11-27 MED ORDER — ALLOPURINOL 100 MG PO TABS
50.0000 mg | ORAL_TABLET | Freq: Every day | ORAL | Status: DC
  Administered 2023-11-28 – 2023-11-29 (×2): 50 mg via ORAL
  Filled 2023-11-27 (×2): qty 1

## 2023-11-27 MED ORDER — POLYETHYLENE GLYCOL 3350 17 G PO PACK
17.0000 g | PACK | Freq: Every day | ORAL | Status: DC
  Administered 2023-11-27 – 2023-11-29 (×3): 17 g via ORAL
  Filled 2023-11-27 (×3): qty 1

## 2023-11-27 MED ORDER — IOHEXOL 350 MG/ML SOLN
75.0000 mL | Freq: Once | INTRAVENOUS | Status: AC | PRN
  Administered 2023-11-27: 75 mL via INTRAVENOUS

## 2023-11-27 MED ORDER — QUETIAPINE FUMARATE ER 200 MG PO TB24
200.0000 mg | ORAL_TABLET | Freq: Every day | ORAL | Status: DC
  Administered 2023-11-27 – 2023-11-28 (×2): 200 mg via ORAL
  Filled 2023-11-27 (×2): qty 1
  Filled 2023-11-27: qty 4

## 2023-11-27 MED ORDER — LACTATED RINGERS IV BOLUS
1000.0000 mL | Freq: Once | INTRAVENOUS | Status: AC
Start: 2023-11-27 — End: 2023-11-27
  Administered 2023-11-27: 1000 mL via INTRAVENOUS

## 2023-11-27 NOTE — ED Triage Notes (Signed)
Pt came to ED for hematemesis that started last night at 11pm. Nursing home gave ger zofran and it started again at 7am, pt has vomited 5 times since. C/O abd pain. Axox4. Hx of dementia.

## 2023-11-27 NOTE — Consult Note (Signed)
Consultation Note   Referring Provider:  Internal Medicine Teaching Service. PCP: Marinda Elk, MD Primary Gastroenterologist: Dr. Karilyn Cota        Reason for Consultation: hematemesis  DOA: 11/27/2023         Hospital Day: 1   ASSESSMENT    Brief Narrative:  73 y.o. year old female with a history of CVA, dementia, COPD, hypertension, DM2, IBS erosive esophagitis, adenomatous colon polyps.   Reported hematemesis.  This is 2nd ED visit for the same.  Her BUN is normal. Hgb is normal. No reported melena. She does have a history of erosive esophagitis but Pantoprazole is on home med list so presumably she is on PPI therapy  IBS.  Previously suffered with diarrhea now describes constipation.  Medications probably contributing. ( Takes Colestid)  Tells me she hasn't had a BM in a month  History of colon polyps. 2 small tubular adenomas removed at time of last colonoscopy in 2016.  Surveillance colonoscopy not done    PLAN:   --Check a hemoccult --Monitor H/H --Probably EGD tomorrow.  The risks and benefits of EGD with possible biopsies were discussed with the patient who agrees to proceed.  --BID IV PPI --Miralax now.   HPI   Patient was seen in the ED late October with nausea and vomiting .  Her white count was elevated but her hemoglobin was normal .  She was diagnosed with a UTI and discharged from the ED .   On 10/02/2023 she was taken back to the ED for brown emesis.  No acute findings on CT scan. Hemoglobin was down a bit from prior lab.  She was discharged from the ED  Today patient was brought back to the ED for evaluation of hematemesis that started last night around 11 PM.  She had recurrent episodes around 7 AM.  Reportedly vomited about 5 times.  She describes the emesis as being very dark brown.  Nursing home gave her suppository last night.  She had a black bowel movement which did not contain any blood or necessarily  look dark.  Today the EDP noticed some dark brown material around patient's mouth.  She is hemodynamically stable.  Hemoglobin is 12.4, up from 11.8 in November.  She takes a daily baby aspirin, does not appear to take any other NSAIDs.  Not anticoagulated.    Icesis does give a history of chronic constipation.     Previous GI Evaluations   April 2016 EGD  Colonic diverticulosis.  A diminutive polyp was removed from the cecum , a 5 mm polyp was removed from the mid descending segment , the distal 5 cm of terminal ileum mucosa appeared normal  Diagnosis 1. Colon, polyp(s), cecal - TUBULAR ADENOMA. - NO HIGH GRADE DYSPLASIA OR MALIGNANCY. 2. Colon, biopsy, ascending - BENIGN COLONIC MUCOSA. - NO ACTIVE INFLAMMATION OR EVIDENCE OF MICROSCOPIC COLITIS. - NO DYSPLASIA OR MALIGNANCY. 3. Colon, polyp(s), ascending - TUBULAR ADENOMA. - NO HIGH GRADE DYSPLASIA OR MALIGNANCY. 4. Colon, biopsy, descending and sigmoid - BENIGN COLONIC MUCOSA. - NO ACTIVE INFLAMMATION OR EVIDENCE OF MICROSCOPIC COLITIS. - NO DYSPLASIA OR MALIGNANCY.   Labs and Imaging: Recent Labs    11/27/23 0800 11/27/23 0832  WBC 10.2  --  HGB 12.4 12.6  HCT 38.1 37.0  PLT 232  --    Recent Labs    11/27/23 0800 11/27/23 0832  NA 141 142  K 4.2 4.1  CL 98 101  CO2 29  --   GLUCOSE 220* 221*  BUN 17 19  CREATININE 0.99 1.00  CALCIUM 9.8  --    Recent Labs    11/27/23 0800  PROT 5.9*  ALBUMIN 3.2*  AST 25  ALT 27  ALKPHOS 87  BILITOT 0.6   No results for input(s): "HEPBSAG", "HCVAB", "HEPAIGM", "HEPBIGM" in the last 72 hours. Recent Labs    11/27/23 0800  LABPROT 13.5  INR 1.0      Past Medical History:  Diagnosis Date   Arthritis    COPD (chronic obstructive pulmonary disease) (HCC)    Diverticulosis    Essential hypertension, benign    Hx of colonic polyps    Hypothyroidism    IBS (irritable bowel syndrome)    Ketoacidosis    Mixed hyperlipidemia    PTSD (post-traumatic stress  disorder)    S/P colonoscopy 11/2009   RMR: pancolonic diverticula, polypoid rectal mucosa with  prominent lymphoid aggregates, repeat in Jan 2016    Stress incontinence, female    Tobacco use    Type 2 diabetes mellitus (HCC)     Past Surgical History:  Procedure Laterality Date   Bilateral tubal ligation     BREAST BIOPSY Left 10/16/2014   negative   CHOLECYSTECTOMY  09/11/2012   Procedure: LAPAROSCOPIC CHOLECYSTECTOMY;  Surgeon: Dalia Heading, MD;  Location: [facility];  Service: General;  Laterality: N/A;   COLONOSCOPY  11/17/2009   Dr. Jena Gauss: normal rectum, pancolonic diverticula, polyps benign, no adenomas.    COLONOSCOPY N/A 02/12/2015   Dr. Jena Gauss: colonic diverticulosis, multiple colonic polyps (tubular adenomas), negative segmental biopsies. Surveillance in 2021   ESOPHAGOGASTRODUODENOSCOPY  11/23/2011   Dr. Jena Gauss: Erosive reflux esophagitis; small hiatal hernia; esophagus dilated empirically   MALONEY DILATION  11/23/2011   Procedure: Elease Hashimoto DILATION;  Surgeon: Corbin Ade, MD;  Location: [facility];  Service: Endoscopy;  Laterality: N/A;   PERICARDIOCENTESIS N/A 11/06/2021   Procedure: PERICARDIOCENTESIS;  Surgeon: Corky Crafts, MD;  Location: [facility] INVASIVE CV LAB;  Service: Cardiovascular;  Laterality: N/A;   SKIN LESION EXCISION     on buttocks   TUBAL LIGATION      Family History  Problem Relation Age of Onset   Cancer Mother        unsure what type   Heart disease Father    Heart attack Father    Schizophrenia Brother    Stroke Brother    Cancer Son        bladder   Coronary artery disease Other    Diabetes type II Other    Colon cancer Neg Hx    Breast cancer Neg Hx     Prior to Admission medications   Medication Sig Start Date End Date Taking? Authorizing Provider  acetaminophen (TYLENOL) 325 MG tablet Take 650 mg by mouth every 8 (eight) hours as needed for mild pain or moderate pain.   Yes [provider]  albuterol (VENTOLIN HFA) 108  (90 Base) MCG/ACT inhaler Inhale 2 puffs into the lungs every 4 (four) hours as needed for wheezing or shortness of breath. 12/03/20  Yes Sherryll Burger, Pratik D, DO  allopurinol (ZYLOPRIM) 100 MG tablet Take 50 mg by mouth daily. Idiopathic gout   Yes [provider]  aspirin 81  MG chewable tablet Chew 81 mg by mouth daily.   Yes [provider]  bisacodyl (DULCOLAX) 10 MG suppository Place 10 mg rectally daily as needed (constipation not relieved by MOM).   Yes [provider]  Calcium Carb-Cholecalciferol (OYSTER SHELL CALCIUM W/D) 500-200 MG-UNIT TABS Take 1 tablet by mouth 2 (two) times daily. 11/10/20  Yes [provider]  carbamide peroxide (DEBROX) 6.5 % OTIC solution Place 3 drops into both ears once a week. Every Monday   Yes [provider]  colchicine 0.6 MG tablet Take 1 tablet (0.6 mg total) by mouth daily. 11/11/21 11/08/48 Yes Marrion Coy, MD  diltiazem (DILACOR XR) 180 MG 24 hr capsule Take 180 mg by mouth daily.   Yes [provider]  divalproex (DEPAKOTE ER) 500 MG 24 hr tablet Take 2 tablets (1,000 mg total) by mouth at bedtime. 11/11/22  Yes Lewie Chamber, MD  Dulaglutide (TRULICITY) 1.5 MG/0.5ML SOAJ Inject 1.5 mg into the skin once a week. Every Wednesday   Yes [provider]  furosemide (LASIX) 20 MG tablet Take 1 tablet (20 mg total) by mouth every other day. 09/07/21  Yes Johnson, Clanford L, MD  insulin aspart (NOVOLOG) 100 UNIT/ML injection Inject 0-10 Units into the skin in the morning, at noon, in the evening, and at bedtime. BS 0-150 inject 0 units  BS 151-200 inject 2 units  BS 201-250 inject 3 units  BS 251-300 inject 5 units  BS 301-350 inject 7 units  BS 351- 400 inject 10 units  BS [id number] call MD   Yes [provider]  Insulin Glargine Solostar (LANTUS) 100 UNIT/ML Solostar Pen Inject 10 Units into the skin in the morning and at bedtime.   Yes [provider]  ipratropium-albuterol (DUONEB)  0.5-2.5 (3) MG/3ML SOLN Take 3 mLs by nebulization every 4 (four) hours as needed (SOB/wheezing).   Yes [provider]  levothyroxine (SYNTHROID) 125 MCG tablet Take 125 mcg by mouth daily before breakfast.   Yes [provider]  magnesium hydroxide (MILK OF MAGNESIA) 400 MG/5ML suspension Take 30 mLs by mouth daily as needed (if no BM in 3 days).   Yes [provider]  memantine (NAMENDA) 10 MG tablet Take 10 mg by mouth 2 (two) times daily.   Yes [provider]  mirtazapine (REMERON) 15 MG tablet Take 15 mg by mouth at bedtime.   Yes [provider]  ondansetron (ZOFRAN) 4 MG tablet Take 1 tablet (4 mg total) by mouth every 6 (six) hours as needed for nausea or vomiting. 10/02/23  Yes Al Decant, PA-C  pantoprazole (PROTONIX) 40 MG tablet TAKE 1 TABLET BY MOUTH ONCE DAILY 30 MINTUES BEFORE BREAKFAST. Patient taking differently: Take 40 mg by mouth daily before breakfast. TAKE 1 TABLET BY MOUTH ONCE DAILY 30 MINTUES BEFORE BREAKFAST. 05/23/20  Yes Anice Paganini, NP  PARoxetine (PAXIL) 40 MG tablet Take 1 tablet (40 mg total) by mouth daily. 02/20/21  Yes Hisada, Barbee Cough, MD  polyethylene glycol powder (GLYCOLAX/MIRALAX) 17 GM/SCOOP powder Take 17 g by mouth daily.   Yes [provider]  promethazine (PHENERGAN) 12.5 MG suppository Place 12.5 mg rectally every 6 (six) hours as needed for nausea or vomiting.   Yes [provider]  QUEtiapine (SEROQUEL XR) 200 MG 24 hr tablet Take 200 mg by mouth at bedtime.   Yes [provider]  rosuvastatin (CRESTOR) 40 MG tablet Take 1 tablet (40 mg total) by mouth daily. 01/12/22  Yes Asa Lente,  Tessa N, PA-C  senna-docusate (SENOKOT-S) 8.6-50 MG tablet Take 1 tablet by mouth at bedtime as needed for mild constipation. 09/04/21  Yes Johnson, Clanford L, MD  Sodium Phosphates (RA SALINE ENEMA RE) Place 1 Dose rectally once as needed (severe constipation). If no relief from both milk of magnesia,  and Bisacodyl suppository, administer one dose of disposable saline enema.   Yes [provider]  vitamin B-12 (CYANOCOBALAMIN) 500 MCG tablet Take 500 mcg by mouth daily.   Yes [provider]  amoxicillin-clavulanate (AUGMENTIN) 875-125 MG tablet Take 1 tablet by mouth every 12 (twelve) hours. 09/02/23   Wynetta Fines, MD  cephALEXin (KEFLEX) 500 MG capsule Take 1 capsule (500 mg total) by mouth 4 (four) times daily. 10/02/23   Al Decant, PA-C  colestipol (COLESTID) 1 g tablet TAKE 2 TABLETS DAILY FOR DIARRHEA. DO NOT TAKE WITHIN 2 HOURS OF OTHER ORAL MEDICATIONS. HOLD FOR CONSTIPATION. Patient taking differently: Take 2 g by mouth daily. TAKE 2 TABLETS DAILY FOR DIARRHEA. DO NOT TAKE WITHIN 2 HOURS OF OTHER ORAL MEDICATIONS. HOLD FOR CONSTIPATION. 05/23/20   Anice Paganini, NP  insulin detemir (LEVEMIR) 100 UNIT/ML FlexPen Inject 8 Units into the skin daily. Patient not taking: Reported on 09/02/2023 11/10/22   Lewie Chamber, MD  Nutritional Supplements (NUTRITIONAL SUPPLEMENT PO) Take 1 each by mouth daily. Magic Cup    [provider]  Pollen Extracts (PROSTAT PO) Take 1 Dose by mouth daily.    [provider]    No current facility-administered medications for this encounter.   Current Outpatient Medications  Medication Sig Dispense Refill   acetaminophen (TYLENOL) 325 MG tablet Take 650 mg by mouth every 8 (eight) hours as needed for mild pain or moderate pain.     albuterol (VENTOLIN HFA) 108 (90 Base) MCG/ACT inhaler Inhale 2 puffs into the lungs every 4 (four) hours as needed for wheezing or shortness of breath. 8 g 1   allopurinol (ZYLOPRIM) 100 MG tablet Take 50 mg by mouth daily. Idiopathic gout     aspirin 81 MG chewable tablet Chew 81 mg by mouth daily.     bisacodyl (DULCOLAX) 10 MG suppository Place 10 mg rectally daily as needed (constipation not relieved by MOM).     Calcium Carb-Cholecalciferol (OYSTER SHELL CALCIUM W/D) 500-200  MG-UNIT TABS Take 1 tablet by mouth 2 (two) times daily.     carbamide peroxide (DEBROX) 6.5 % OTIC solution Place 3 drops into both ears once a week. Every Monday     colchicine 0.6 MG tablet Take 1 tablet (0.6 mg total) by mouth daily. 30 tablet 1   diltiazem (DILACOR XR) 180 MG 24 hr capsule Take 180 mg by mouth daily.     divalproex (DEPAKOTE ER) 500 MG 24 hr tablet Take 2 tablets (1,000 mg total) by mouth at bedtime.     Dulaglutide (TRULICITY) 1.5 MG/0.5ML SOAJ Inject 1.5 mg into the skin once a week. Every Wednesday     furosemide (LASIX) 20 MG tablet Take 1 tablet (20 mg total) by mouth every other day. 30 tablet    insulin aspart (NOVOLOG) 100 UNIT/ML injection Inject 0-10 Units into the skin in the morning, at noon, in the evening, and at bedtime. BS 0-150 inject 0 units  BS 151-200 inject 2 units  BS 201-250 inject 3 units  BS 251-300 inject 5 units  BS 301-350 inject 7 units  BS 351- 400 inject 10 units  BS [id number] call MD  Insulin Glargine Solostar (LANTUS) 100 UNIT/ML Solostar Pen Inject 10 Units into the skin in the morning and at bedtime.     ipratropium-albuterol (DUONEB) 0.5-2.5 (3) MG/3ML SOLN Take 3 mLs by nebulization every 4 (four) hours as needed (SOB/wheezing).     levothyroxine (SYNTHROID) 125 MCG tablet Take 125 mcg by mouth daily before breakfast.     magnesium hydroxide (MILK OF MAGNESIA) 400 MG/5ML suspension Take 30 mLs by mouth daily as needed (if no BM in 3 days).     memantine (NAMENDA) 10 MG tablet Take 10 mg by mouth 2 (two) times daily.     mirtazapine (REMERON) 15 MG tablet Take 15 mg by mouth at bedtime.     ondansetron (ZOFRAN) 4 MG tablet Take 1 tablet (4 mg total) by mouth every 6 (six) hours as needed for nausea or vomiting. 12 tablet 0   pantoprazole (PROTONIX) 40 MG tablet TAKE 1 TABLET BY MOUTH ONCE DAILY 30 MINTUES BEFORE BREAKFAST. (Patient taking differently: Take 40 mg by mouth daily before breakfast. TAKE 1 TABLET BY MOUTH ONCE DAILY 30  MINTUES BEFORE BREAKFAST.) 30 tablet 11   PARoxetine (PAXIL) 40 MG tablet Take 1 tablet (40 mg total) by mouth daily. 90 tablet 0   polyethylene glycol powder (GLYCOLAX/MIRALAX) 17 GM/SCOOP powder Take 17 g by mouth daily.     promethazine (PHENERGAN) 12.5 MG suppository Place 12.5 mg rectally every 6 (six) hours as needed for nausea or vomiting.     QUEtiapine (SEROQUEL XR) 200 MG 24 hr tablet Take 200 mg by mouth at bedtime.     rosuvastatin (CRESTOR) 40 MG tablet Take 1 tablet (40 mg total) by mouth daily. 90 tablet 3   senna-docusate (SENOKOT-S) 8.6-50 MG tablet Take 1 tablet by mouth at bedtime as needed for mild constipation.     Sodium Phosphates (RA SALINE ENEMA RE) Place 1 Dose rectally once as needed (severe constipation). If no relief from both milk of magnesia, and Bisacodyl suppository, administer one dose of disposable saline enema.     vitamin B-12 (CYANOCOBALAMIN) 500 MCG tablet Take 500 mcg by mouth daily.     amoxicillin-clavulanate (AUGMENTIN) 875-125 MG tablet Take 1 tablet by mouth every 12 (twelve) hours. 14 tablet 0   cephALEXin (KEFLEX) 500 MG capsule Take 1 capsule (500 mg total) by mouth 4 (four) times daily. 28 capsule 0   colestipol (COLESTID) 1 g tablet TAKE 2 TABLETS DAILY FOR DIARRHEA. DO NOT TAKE WITHIN 2 HOURS OF OTHER ORAL MEDICATIONS. HOLD FOR CONSTIPATION. (Patient taking differently: Take 2 g by mouth daily. TAKE 2 TABLETS DAILY FOR DIARRHEA. DO NOT TAKE WITHIN 2 HOURS OF OTHER ORAL MEDICATIONS. HOLD FOR CONSTIPATION.) 60 tablet 11   insulin detemir (LEVEMIR) 100 UNIT/ML FlexPen Inject 8 Units into the skin daily. (Patient not taking: Reported on 09/02/2023) 15 mL    Nutritional Supplements (NUTRITIONAL SUPPLEMENT PO) Take 1 each by mouth daily. Magic Cup     Pollen Extracts (PROSTAT PO) Take 1 Dose by mouth daily.      Allergies as of 11/27/2023   (No Known Allergies)    Social History   Socioeconomic History   Marital status: Legally Separated    Spouse  name: Not on file   Number of children: Not on file   Years of education: Not on file   Highest education level: Not on file  Occupational History   Not on file  Tobacco Use   Smoking status: Every Day    Current packs/day: 2.00  Average packs/day: 2.0 packs/day for 43.0 years (86.0 ttl pk-yrs)    Types: Cigarettes   Smokeless tobacco: Never  Vaping Use   Vaping status: Never Used  Substance and Sexual Activity   Alcohol use: No    Alcohol/week: 0.0 standard drinks of alcohol   Drug use: No   Sexual activity: Not Currently    Birth control/protection: Surgical, Post-menopausal    Comment: tubal  Other Topics Concern   Not on file  Social History Narrative   Not on file   Social Drivers of Health   Financial Resource Strain: Not on file  Food Insecurity: No Food Insecurity (12/05/2020)   Hunger Vital Sign    Worried About Running Out of Food in the Last Year: Never true    Ran Out of Food in the Last Year: Never true  Transportation Needs: No Transportation Needs (12/05/2020)   PRAPARE - Administrator, Civil Service (Medical): No    Lack of Transportation (Non-Medical): No  Physical Activity: Not on file  Stress: Not on file  Social Connections: Not on file  Intimate Partner Violence: Not on file     Code Status   Code Status: Prior  Review of Systems: All systems reviewed and negative except where noted in HPI.  Physical Exam: Vital signs in last 24 hours: Temp:  [97.6 F (36.4 C)-98.1 F (36.7 C)] 97.6 F (36.4 C) (01/19 1326) Pulse Rate:  [101-106] 101 (01/19 1330) Resp:  [12-21] 13 (01/19 1330) BP: (107-149)/(40-83) 134/54 (01/19 1330) SpO2:  [92 %-100 %] 95 % (01/19 1330) Weight:  [95.3 kg] 95.3 kg (01/19 0750)    General:  Pleasant female in NAD Psych:  Cooperative. Normal mood and affect Eyes: Pupils equal Ears:  Normal auditory acuity Nose: No deformity, discharge or lesions Neck:  Supple, no masses felt Lungs:  Clear to  auscultation.  Heart:  Regular rate, regular rhythm.  Abdomen:  Soft, nondistended, nontender, active bowel sounds, no masses felt Rectal :  Deferred Msk: Symmetrical without gross deformities.  Neurologic:  Alert, oriented, grossly normal neurologically Extremities : No edema Skin:  Intact without significant lesions.    Intake/Output from previous day: No intake/output data recorded. Intake/Output this shift:  No intake/output data recorded.   Willette Cluster, NP-C   11/27/2023, 3:41 PM

## 2023-11-27 NOTE — H&P (View-Only) (Signed)
Consultation Note   Referring Provider:  Internal Medicine Teaching Service. PCP: Marinda Elk, MD Primary Gastroenterologist: Dr. Karilyn Cota        Reason for Consultation: hematemesis  DOA: 11/27/2023         Hospital Day: 1   ASSESSMENT    Brief Narrative:  73 y.o. year old female with a history of CVA, dementia, COPD, hypertension, DM2, IBS erosive esophagitis, adenomatous colon polyps.   Reported hematemesis.  This is 2nd ED visit for the same.  Her BUN is normal. Hgb is normal. No reported melena. She does have a history of erosive esophagitis but Pantoprazole is on home med list so presumably she is on PPI therapy  IBS.  Previously suffered with diarrhea now describes constipation.  Medications probably contributing. ( Takes Colestid)  Tells me she hasn't had a BM in a month  History of colon polyps. 2 small tubular adenomas removed at time of last colonoscopy in 2016.  Surveillance colonoscopy not done    PLAN:   --Check a hemoccult --Monitor H/H --Probably EGD tomorrow.  The risks and benefits of EGD with possible biopsies were discussed with the patient who agrees to proceed.  --BID IV PPI --Miralax now.   HPI   Patient was seen in the ED late October with nausea and vomiting .  Her white count was elevated but her hemoglobin was normal .  She was diagnosed with a UTI and discharged from the ED .   On 10/02/2023 she was taken back to the ED for brown emesis.  No acute findings on CT scan. Hemoglobin was down a bit from prior lab.  She was discharged from the ED  Today patient was brought back to the ED for evaluation of hematemesis that started last night around 11 PM.  She had recurrent episodes around 7 AM.  Reportedly vomited about 5 times.  She describes the emesis as being very dark brown.  Nursing home gave her suppository last night.  She had a black bowel movement which did not contain any blood or necessarily  look dark.  Today the EDP noticed some dark brown material around patient's mouth.  She is hemodynamically stable.  Hemoglobin is 12.4, up from 11.8 in November.  She takes a daily baby aspirin, does not appear to take any other NSAIDs.  Not anticoagulated.    Icesis does give a history of chronic constipation.     Previous GI Evaluations   April 2016 EGD  Colonic diverticulosis.  A diminutive polyp was removed from the cecum , a 5 mm polyp was removed from the mid descending segment , the distal 5 cm of terminal ileum mucosa appeared normal  Diagnosis 1. Colon, polyp(s), cecal - TUBULAR ADENOMA. - NO HIGH GRADE DYSPLASIA OR MALIGNANCY. 2. Colon, biopsy, ascending - BENIGN COLONIC MUCOSA. - NO ACTIVE INFLAMMATION OR EVIDENCE OF MICROSCOPIC COLITIS. - NO DYSPLASIA OR MALIGNANCY. 3. Colon, polyp(s), ascending - TUBULAR ADENOMA. - NO HIGH GRADE DYSPLASIA OR MALIGNANCY. 4. Colon, biopsy, descending and sigmoid - BENIGN COLONIC MUCOSA. - NO ACTIVE INFLAMMATION OR EVIDENCE OF MICROSCOPIC COLITIS. - NO DYSPLASIA OR MALIGNANCY.   Labs and Imaging: Recent Labs    11/27/23 0800 11/27/23 0832  WBC 10.2  --  HGB 12.4 12.6  HCT 38.1 37.0  PLT 232  --    Recent Labs    11/27/23 0800 11/27/23 0832  NA 141 142  K 4.2 4.1  CL 98 101  CO2 29  --   GLUCOSE 220* 221*  BUN 17 19  CREATININE 0.99 1.00  CALCIUM 9.8  --    Recent Labs    11/27/23 0800  PROT 5.9*  ALBUMIN 3.2*  AST 25  ALT 27  ALKPHOS 87  BILITOT 0.6   No results for input(s): "HEPBSAG", "HCVAB", "HEPAIGM", "HEPBIGM" in the last 72 hours. Recent Labs    11/27/23 0800  LABPROT 13.5  INR 1.0      Past Medical History:  Diagnosis Date   Arthritis    COPD (chronic obstructive pulmonary disease) (HCC)    Diverticulosis    Essential hypertension, benign    Hx of colonic polyps    Hypothyroidism    IBS (irritable bowel syndrome)    Ketoacidosis    Mixed hyperlipidemia    PTSD (post-traumatic stress  disorder)    S/P colonoscopy 11/2009   RMR: pancolonic diverticula, polypoid rectal mucosa with  prominent lymphoid aggregates, repeat in Jan 2016    Stress incontinence, female    Tobacco use    Type 2 diabetes mellitus (HCC)     Past Surgical History:  Procedure Laterality Date   Bilateral tubal ligation     BREAST BIOPSY Left 10/16/2014   negative   CHOLECYSTECTOMY  09/11/2012   Procedure: LAPAROSCOPIC CHOLECYSTECTOMY;  Surgeon: Dalia Heading, MD;  Location: [facility];  Service: General;  Laterality: N/A;   COLONOSCOPY  11/17/2009   Dr. Jena Gauss: normal rectum, pancolonic diverticula, polyps benign, no adenomas.    COLONOSCOPY N/A 02/12/2015   Dr. Jena Gauss: colonic diverticulosis, multiple colonic polyps (tubular adenomas), negative segmental biopsies. Surveillance in 2021   ESOPHAGOGASTRODUODENOSCOPY  11/23/2011   Dr. Jena Gauss: Erosive reflux esophagitis; small hiatal hernia; esophagus dilated empirically   MALONEY DILATION  11/23/2011   Procedure: Elease Hashimoto DILATION;  Surgeon: Corbin Ade, MD;  Location: [facility];  Service: Endoscopy;  Laterality: N/A;   PERICARDIOCENTESIS N/A 11/06/2021   Procedure: PERICARDIOCENTESIS;  Surgeon: Corky Crafts, MD;  Location: [facility] INVASIVE CV LAB;  Service: Cardiovascular;  Laterality: N/A;   SKIN LESION EXCISION     on buttocks   TUBAL LIGATION      Family History  Problem Relation Age of Onset   Cancer Mother        unsure what type   Heart disease Father    Heart attack Father    Schizophrenia Brother    Stroke Brother    Cancer Son        bladder   Coronary artery disease Other    Diabetes type II Other    Colon cancer Neg Hx    Breast cancer Neg Hx     Prior to Admission medications   Medication Sig Start Date End Date Taking? Authorizing Provider  acetaminophen (TYLENOL) 325 MG tablet Take 650 mg by mouth every 8 (eight) hours as needed for mild pain or moderate pain.   Yes [provider]  albuterol (VENTOLIN HFA) 108  (90 Base) MCG/ACT inhaler Inhale 2 puffs into the lungs every 4 (four) hours as needed for wheezing or shortness of breath. 12/03/20  Yes Sherryll Burger, Pratik D, DO  allopurinol (ZYLOPRIM) 100 MG tablet Take 50 mg by mouth daily. Idiopathic gout   Yes [provider]  aspirin 81  MG chewable tablet Chew 81 mg by mouth daily.   Yes [provider]  bisacodyl (DULCOLAX) 10 MG suppository Place 10 mg rectally daily as needed (constipation not relieved by MOM).   Yes [provider]  Calcium Carb-Cholecalciferol (OYSTER SHELL CALCIUM W/D) 500-200 MG-UNIT TABS Take 1 tablet by mouth 2 (two) times daily. 11/10/20  Yes [provider]  carbamide peroxide (DEBROX) 6.5 % OTIC solution Place 3 drops into both ears once a week. Every Monday   Yes [provider]  colchicine 0.6 MG tablet Take 1 tablet (0.6 mg total) by mouth daily. 11/11/21 11/08/48 Yes Marrion Coy, MD  diltiazem (DILACOR XR) 180 MG 24 hr capsule Take 180 mg by mouth daily.   Yes [provider]  divalproex (DEPAKOTE ER) 500 MG 24 hr tablet Take 2 tablets (1,000 mg total) by mouth at bedtime. 11/11/22  Yes Lewie Chamber, MD  Dulaglutide (TRULICITY) 1.5 MG/0.5ML SOAJ Inject 1.5 mg into the skin once a week. Every Wednesday   Yes [provider]  furosemide (LASIX) 20 MG tablet Take 1 tablet (20 mg total) by mouth every other day. 09/07/21  Yes Johnson, Clanford L, MD  insulin aspart (NOVOLOG) 100 UNIT/ML injection Inject 0-10 Units into the skin in the morning, at noon, in the evening, and at bedtime. BS 0-150 inject 0 units  BS 151-200 inject 2 units  BS 201-250 inject 3 units  BS 251-300 inject 5 units  BS 301-350 inject 7 units  BS 351- 400 inject 10 units  BS [id number] call MD   Yes [provider]  Insulin Glargine Solostar (LANTUS) 100 UNIT/ML Solostar Pen Inject 10 Units into the skin in the morning and at bedtime.   Yes [provider]  ipratropium-albuterol (DUONEB)  0.5-2.5 (3) MG/3ML SOLN Take 3 mLs by nebulization every 4 (four) hours as needed (SOB/wheezing).   Yes [provider]  levothyroxine (SYNTHROID) 125 MCG tablet Take 125 mcg by mouth daily before breakfast.   Yes [provider]  magnesium hydroxide (MILK OF MAGNESIA) 400 MG/5ML suspension Take 30 mLs by mouth daily as needed (if no BM in 3 days).   Yes [provider]  memantine (NAMENDA) 10 MG tablet Take 10 mg by mouth 2 (two) times daily.   Yes [provider]  mirtazapine (REMERON) 15 MG tablet Take 15 mg by mouth at bedtime.   Yes [provider]  ondansetron (ZOFRAN) 4 MG tablet Take 1 tablet (4 mg total) by mouth every 6 (six) hours as needed for nausea or vomiting. 10/02/23  Yes Al Decant, PA-C  pantoprazole (PROTONIX) 40 MG tablet TAKE 1 TABLET BY MOUTH ONCE DAILY 30 MINTUES BEFORE BREAKFAST. Patient taking differently: Take 40 mg by mouth daily before breakfast. TAKE 1 TABLET BY MOUTH ONCE DAILY 30 MINTUES BEFORE BREAKFAST. 05/23/20  Yes Anice Paganini, NP  PARoxetine (PAXIL) 40 MG tablet Take 1 tablet (40 mg total) by mouth daily. 02/20/21  Yes Hisada, Barbee Cough, MD  polyethylene glycol powder (GLYCOLAX/MIRALAX) 17 GM/SCOOP powder Take 17 g by mouth daily.   Yes [provider]  promethazine (PHENERGAN) 12.5 MG suppository Place 12.5 mg rectally every 6 (six) hours as needed for nausea or vomiting.   Yes [provider]  QUEtiapine (SEROQUEL XR) 200 MG 24 hr tablet Take 200 mg by mouth at bedtime.   Yes [provider]  rosuvastatin (CRESTOR) 40 MG tablet Take 1 tablet (40 mg total) by mouth daily. 01/12/22  Yes Asa Lente,  Tessa N, PA-C  senna-docusate (SENOKOT-S) 8.6-50 MG tablet Take 1 tablet by mouth at bedtime as needed for mild constipation. 09/04/21  Yes Johnson, Clanford L, MD  Sodium Phosphates (RA SALINE ENEMA RE) Place 1 Dose rectally once as needed (severe constipation). If no relief from both milk of magnesia,  and Bisacodyl suppository, administer one dose of disposable saline enema.   Yes [provider]  vitamin B-12 (CYANOCOBALAMIN) 500 MCG tablet Take 500 mcg by mouth daily.   Yes [provider]  amoxicillin-clavulanate (AUGMENTIN) 875-125 MG tablet Take 1 tablet by mouth every 12 (twelve) hours. 09/02/23   Wynetta Fines, MD  cephALEXin (KEFLEX) 500 MG capsule Take 1 capsule (500 mg total) by mouth 4 (four) times daily. 10/02/23   Al Decant, PA-C  colestipol (COLESTID) 1 g tablet TAKE 2 TABLETS DAILY FOR DIARRHEA. DO NOT TAKE WITHIN 2 HOURS OF OTHER ORAL MEDICATIONS. HOLD FOR CONSTIPATION. Patient taking differently: Take 2 g by mouth daily. TAKE 2 TABLETS DAILY FOR DIARRHEA. DO NOT TAKE WITHIN 2 HOURS OF OTHER ORAL MEDICATIONS. HOLD FOR CONSTIPATION. 05/23/20   Anice Paganini, NP  insulin detemir (LEVEMIR) 100 UNIT/ML FlexPen Inject 8 Units into the skin daily. Patient not taking: Reported on 09/02/2023 11/10/22   Lewie Chamber, MD  Nutritional Supplements (NUTRITIONAL SUPPLEMENT PO) Take 1 each by mouth daily. Magic Cup    [provider]  Pollen Extracts (PROSTAT PO) Take 1 Dose by mouth daily.    [provider]    No current facility-administered medications for this encounter.   Current Outpatient Medications  Medication Sig Dispense Refill   acetaminophen (TYLENOL) 325 MG tablet Take 650 mg by mouth every 8 (eight) hours as needed for mild pain or moderate pain.     albuterol (VENTOLIN HFA) 108 (90 Base) MCG/ACT inhaler Inhale 2 puffs into the lungs every 4 (four) hours as needed for wheezing or shortness of breath. 8 g 1   allopurinol (ZYLOPRIM) 100 MG tablet Take 50 mg by mouth daily. Idiopathic gout     aspirin 81 MG chewable tablet Chew 81 mg by mouth daily.     bisacodyl (DULCOLAX) 10 MG suppository Place 10 mg rectally daily as needed (constipation not relieved by MOM).     Calcium Carb-Cholecalciferol (OYSTER SHELL CALCIUM W/D) 500-200  MG-UNIT TABS Take 1 tablet by mouth 2 (two) times daily.     carbamide peroxide (DEBROX) 6.5 % OTIC solution Place 3 drops into both ears once a week. Every Monday     colchicine 0.6 MG tablet Take 1 tablet (0.6 mg total) by mouth daily. 30 tablet 1   diltiazem (DILACOR XR) 180 MG 24 hr capsule Take 180 mg by mouth daily.     divalproex (DEPAKOTE ER) 500 MG 24 hr tablet Take 2 tablets (1,000 mg total) by mouth at bedtime.     Dulaglutide (TRULICITY) 1.5 MG/0.5ML SOAJ Inject 1.5 mg into the skin once a week. Every Wednesday     furosemide (LASIX) 20 MG tablet Take 1 tablet (20 mg total) by mouth every other day. 30 tablet    insulin aspart (NOVOLOG) 100 UNIT/ML injection Inject 0-10 Units into the skin in the morning, at noon, in the evening, and at bedtime. BS 0-150 inject 0 units  BS 151-200 inject 2 units  BS 201-250 inject 3 units  BS 251-300 inject 5 units  BS 301-350 inject 7 units  BS 351- 400 inject 10 units  BS [id number] call MD  Insulin Glargine Solostar (LANTUS) 100 UNIT/ML Solostar Pen Inject 10 Units into the skin in the morning and at bedtime.     ipratropium-albuterol (DUONEB) 0.5-2.5 (3) MG/3ML SOLN Take 3 mLs by nebulization every 4 (four) hours as needed (SOB/wheezing).     levothyroxine (SYNTHROID) 125 MCG tablet Take 125 mcg by mouth daily before breakfast.     magnesium hydroxide (MILK OF MAGNESIA) 400 MG/5ML suspension Take 30 mLs by mouth daily as needed (if no BM in 3 days).     memantine (NAMENDA) 10 MG tablet Take 10 mg by mouth 2 (two) times daily.     mirtazapine (REMERON) 15 MG tablet Take 15 mg by mouth at bedtime.     ondansetron (ZOFRAN) 4 MG tablet Take 1 tablet (4 mg total) by mouth every 6 (six) hours as needed for nausea or vomiting. 12 tablet 0   pantoprazole (PROTONIX) 40 MG tablet TAKE 1 TABLET BY MOUTH ONCE DAILY 30 MINTUES BEFORE BREAKFAST. (Patient taking differently: Take 40 mg by mouth daily before breakfast. TAKE 1 TABLET BY MOUTH ONCE DAILY 30  MINTUES BEFORE BREAKFAST.) 30 tablet 11   PARoxetine (PAXIL) 40 MG tablet Take 1 tablet (40 mg total) by mouth daily. 90 tablet 0   polyethylene glycol powder (GLYCOLAX/MIRALAX) 17 GM/SCOOP powder Take 17 g by mouth daily.     promethazine (PHENERGAN) 12.5 MG suppository Place 12.5 mg rectally every 6 (six) hours as needed for nausea or vomiting.     QUEtiapine (SEROQUEL XR) 200 MG 24 hr tablet Take 200 mg by mouth at bedtime.     rosuvastatin (CRESTOR) 40 MG tablet Take 1 tablet (40 mg total) by mouth daily. 90 tablet 3   senna-docusate (SENOKOT-S) 8.6-50 MG tablet Take 1 tablet by mouth at bedtime as needed for mild constipation.     Sodium Phosphates (RA SALINE ENEMA RE) Place 1 Dose rectally once as needed (severe constipation). If no relief from both milk of magnesia, and Bisacodyl suppository, administer one dose of disposable saline enema.     vitamin B-12 (CYANOCOBALAMIN) 500 MCG tablet Take 500 mcg by mouth daily.     amoxicillin-clavulanate (AUGMENTIN) 875-125 MG tablet Take 1 tablet by mouth every 12 (twelve) hours. 14 tablet 0   cephALEXin (KEFLEX) 500 MG capsule Take 1 capsule (500 mg total) by mouth 4 (four) times daily. 28 capsule 0   colestipol (COLESTID) 1 g tablet TAKE 2 TABLETS DAILY FOR DIARRHEA. DO NOT TAKE WITHIN 2 HOURS OF OTHER ORAL MEDICATIONS. HOLD FOR CONSTIPATION. (Patient taking differently: Take 2 g by mouth daily. TAKE 2 TABLETS DAILY FOR DIARRHEA. DO NOT TAKE WITHIN 2 HOURS OF OTHER ORAL MEDICATIONS. HOLD FOR CONSTIPATION.) 60 tablet 11   insulin detemir (LEVEMIR) 100 UNIT/ML FlexPen Inject 8 Units into the skin daily. (Patient not taking: Reported on 09/02/2023) 15 mL    Nutritional Supplements (NUTRITIONAL SUPPLEMENT PO) Take 1 each by mouth daily. Magic Cup     Pollen Extracts (PROSTAT PO) Take 1 Dose by mouth daily.      Allergies as of 11/27/2023   (No Known Allergies)    Social History   Socioeconomic History   Marital status: Legally Separated    Spouse  name: Not on file   Number of children: Not on file   Years of education: Not on file   Highest education level: Not on file  Occupational History   Not on file  Tobacco Use   Smoking status: Every Day    Current packs/day: 2.00  Average packs/day: 2.0 packs/day for 43.0 years (86.0 ttl pk-yrs)    Types: Cigarettes   Smokeless tobacco: Never  Vaping Use   Vaping status: Never Used  Substance and Sexual Activity   Alcohol use: No    Alcohol/week: 0.0 standard drinks of alcohol   Drug use: No   Sexual activity: Not Currently    Birth control/protection: Surgical, Post-menopausal    Comment: tubal  Other Topics Concern   Not on file  Social History Narrative   Not on file   Social Drivers of Health   Financial Resource Strain: Not on file  Food Insecurity: No Food Insecurity (12/05/2020)   Hunger Vital Sign    Worried About Running Out of Food in the Last Year: Never true    Ran Out of Food in the Last Year: Never true  Transportation Needs: No Transportation Needs (12/05/2020)   PRAPARE - Administrator, Civil Service (Medical): No    Lack of Transportation (Non-Medical): No  Physical Activity: Not on file  Stress: Not on file  Social Connections: Not on file  Intimate Partner Violence: Not on file     Code Status   Code Status: Prior  Review of Systems: All systems reviewed and negative except where noted in HPI.  Physical Exam: Vital signs in last 24 hours: Temp:  [97.6 F (36.4 C)-98.1 F (36.7 C)] 97.6 F (36.4 C) (01/19 1326) Pulse Rate:  [101-106] 101 (01/19 1330) Resp:  [12-21] 13 (01/19 1330) BP: (107-149)/(40-83) 134/54 (01/19 1330) SpO2:  [92 %-100 %] 95 % (01/19 1330) Weight:  [95.3 kg] 95.3 kg (01/19 0750)    General:  Pleasant female in NAD Psych:  Cooperative. Normal mood and affect Eyes: Pupils equal Ears:  Normal auditory acuity Nose: No deformity, discharge or lesions Neck:  Supple, no masses felt Lungs:  Clear to  auscultation.  Heart:  Regular rate, regular rhythm.  Abdomen:  Soft, nondistended, nontender, active bowel sounds, no masses felt Rectal :  Deferred Msk: Symmetrical without gross deformities.  Neurologic:  Alert, oriented, grossly normal neurologically Extremities : No edema Skin:  Intact without significant lesions.    Intake/Output from previous day: No intake/output data recorded. Intake/Output this shift:  No intake/output data recorded.   Willette Cluster, NP-C   11/27/2023, 3:41 PM

## 2023-11-27 NOTE — H&P (Signed)
History and Physical   Claudia Keller:[id number] DOB: Dec 16, 1950 DOA: 11/27/2023  PCP: Marinda Elk, MD   Patient coming from: SNF  Chief Complaint: Hematemesis  HPI: Claudia Keller is a 73 y.o. female with medical history significant of dementia, hypertension, hypothyroidism, hyperlipidemia, GERD, stroke, atrial fibrillation, CKD 3A, diabetes, pericardial effusion, CHF, gout, depression, anxiety, bipolar disorder, PTSD, COPD, obesity, ischemic colitis, IBS, QT prolongation presenting with hematemesis.  Episode of nausea vomiting with hematemesis noted yesterday evening around 11 PM and nursing facility.  Patient received a dose of Zofran and had recurrent episode at 7 AM.  Had had 5 further episodes since.  Patient has reported nausea and abdominal cramping.  Did have normal bowel movement yesterday after suppository per nursing facility staff.  Did recently present for concern for hematemesis last November.  With reassuring workup beyond suspected UTI for which she was prescribed Keflex.  Patient returning today with recurrent episode of suspected hematemesis with EDP noted eating dry coffee-ground emesis on pain patient's mouth on initial presentation but this was unable to be saved for testing.  Patient denies fevers, chills, chest pain, shortness of breath, abdominal pain, constipation, diarrhea, nausea, vomiting.  ED Course: Vital signs in the ED notable for heart rate in the 100s, blood pressure in the 100s to 140 systolic.  Lab workup included CMP with glucose of 220, protein 5.9, albumin 3.2.  CBC within normal limits.  PT, PTT, INR within normal limits.  Lactic acid borderline at 2.1.  Type and screen performed in the ED.  Chest x-ray showed no acute normality.  CT abdomen pelvis showed no acute normality.  Patient received Zofran and IV PPI.  Also received 1 L IV fluids.  GI consulted and will see the patient and EDP reports that they may plan for EGD in the morning of  1/20.  Review of Systems: As per HPI otherwise all other systems reviewed and are negative.  Past Medical History:  Diagnosis Date   Acute respiratory disease due to COVID-19 virus 12/07/2020   AMS (altered mental status) 11/05/2021   11/04/2021 - 11/10/2021 hospitalized initially with metabolic encephalopathy in the context of hypoxic respiratory failure with acute on chronic CHF.  Pericardiocentesis performed for pericarditis related bloody fluid.  Altered mental status attributed to UTI clinically.  11/04/2021 cath urine revealed multiple species.  Full course of Rocephin completed.     Arthritis    CAP (community acquired pneumonia) 11/10/2022   COPD (chronic obstructive pulmonary disease) (HCC)    Diarrhea 11/12/2009   Qualifier: Diagnosis of   By: Minna Merritts         Diverticulosis    DKA (diabetic ketoacidosis) (HCC) 03/17/2020   IMO SNOMED Dx Update Oct 2024     Elevated AST (SGOT) 12/07/2020   Essential hypertension, benign    Hx of colonic polyps    Hyperosmolar syndrome 11/06/2017   Hypothyroidism    IBS (irritable bowel syndrome)    Ketoacidosis    Metabolic encephalopathy 11/09/2021   Mixed hyperlipidemia    PTSD (post-traumatic stress disorder)    S/P colonoscopy 11/2009   RMR: pancolonic diverticula, polypoid rectal mucosa with  prominent lymphoid aggregates, repeat in Jan 2016    Stress incontinence, female    Tobacco use    Type 2 diabetes mellitus (HCC)    Urinary tract infection 11/05/2021    Past Surgical History:  Procedure Laterality Date   Bilateral tubal ligation     BREAST BIOPSY Left 10/16/2014  negative   CHOLECYSTECTOMY  09/11/2012   Procedure: LAPAROSCOPIC CHOLECYSTECTOMY;  Surgeon: Dalia Heading, MD;  Location: [facility];  Service: General;  Laterality: N/A;   COLONOSCOPY  11/17/2009   Dr. Jena Gauss: normal rectum, pancolonic diverticula, polyps benign, no adenomas.    COLONOSCOPY N/A 02/12/2015   Dr. Jena Gauss: colonic diverticulosis, multiple colonic  polyps (tubular adenomas), negative segmental biopsies. Surveillance in 2021   ESOPHAGOGASTRODUODENOSCOPY  11/23/2011   Dr. Jena Gauss: Erosive reflux esophagitis; small hiatal hernia; esophagus dilated empirically   MALONEY DILATION  11/23/2011   Procedure: Elease Hashimoto DILATION;  Surgeon: Corbin Ade, MD;  Location: [facility];  Service: Endoscopy;  Laterality: N/A;   PERICARDIOCENTESIS N/A 11/06/2021   Procedure: PERICARDIOCENTESIS;  Surgeon: Corky Crafts, MD;  Location: [facility] INVASIVE CV LAB;  Service: Cardiovascular;  Laterality: N/A;   SKIN LESION EXCISION     on buttocks   TUBAL LIGATION      Social History  reports that she has been smoking cigarettes. She has a 86 pack-year smoking history. She has never used smokeless tobacco. She reports that she does not drink alcohol and does not use drugs.  No Known Allergies  Family History  Problem Relation Age of Onset   Cancer Mother        unsure what type   Heart disease Father    Heart attack Father    Schizophrenia Brother    Stroke Brother    Cancer Son        bladder   Coronary artery disease Other    Diabetes type II Other    Colon cancer Neg Hx    Breast cancer Neg Hx   Reviewed on admission  Prior to Admission medications   Medication Sig Start Date End Date Taking? Authorizing Provider  acetaminophen (TYLENOL) 325 MG tablet Take 650 mg by mouth every 8 (eight) hours as needed for mild pain or moderate pain.   Yes [provider]  albuterol (VENTOLIN HFA) 108 (90 Base) MCG/ACT inhaler Inhale 2 puffs into the lungs every 4 (four) hours as needed for wheezing or shortness of breath. 12/03/20  Yes Sherryll Burger, Pratik D, DO  allopurinol (ZYLOPRIM) 100 MG tablet Take 50 mg by mouth daily. Idiopathic gout   Yes [provider]  aspirin 81 MG chewable tablet Chew 81 mg by mouth daily.   Yes [provider]  bisacodyl (DULCOLAX) 10 MG suppository Place 10 mg rectally daily as needed (constipation not  relieved by MOM).   Yes [provider]  Calcium Carb-Cholecalciferol (OYSTER SHELL CALCIUM W/D) 500-200 MG-UNIT TABS Take 1 tablet by mouth 2 (two) times daily. 11/10/20  Yes [provider]  carbamide peroxide (DEBROX) 6.5 % OTIC solution Place 3 drops into both ears once a week. Every Monday   Yes [provider]  colchicine 0.6 MG tablet Take 1 tablet (0.6 mg total) by mouth daily. 11/11/21 11/08/48 Yes Marrion Coy, MD  diltiazem (DILACOR XR) 180 MG 24 hr capsule Take 180 mg by mouth daily.   Yes [provider]  divalproex (DEPAKOTE ER) 500 MG 24 hr tablet Take 2 tablets (1,000 mg total) by mouth at bedtime. 11/11/22  Yes Lewie Chamber, MD  Dulaglutide (TRULICITY) 1.5 MG/0.5ML SOAJ Inject 1.5 mg into the skin once a week. Every Wednesday   Yes [provider]  furosemide (LASIX) 20 MG tablet Take 1 tablet (20 mg total) by mouth every other day. 09/07/21  Yes Johnson, Clanford L, MD  insulin aspart (NOVOLOG) 100  UNIT/ML injection Inject 0-10 Units into the skin in the morning, at noon, in the evening, and at bedtime. BS 0-150 inject 0 units  BS 151-200 inject 2 units  BS 201-250 inject 3 units  BS 251-300 inject 5 units  BS 301-350 inject 7 units  BS 351- 400 inject 10 units  BS 401-999 call MD   Yes [provider]  Insulin Glargine Solostar (LANTUS) 100 UNIT/ML Solostar Pen Inject 10 Units into the skin in the morning and at bedtime.   Yes [provider]  ipratropium-albuterol (DUONEB) 0.5-2.5 (3) MG/3ML SOLN Take 3 mLs by nebulization every 4 (four) hours as needed (SOB/wheezing).   Yes [provider]  levothyroxine (SYNTHROID) 125 MCG tablet Take 125 mcg by mouth daily before breakfast.   Yes [provider]  magnesium hydroxide (MILK OF MAGNESIA) 400 MG/5ML suspension Take 30 mLs by mouth daily as needed (if no BM in 3 days).   Yes [provider]  memantine (NAMENDA) 10 MG tablet Take 10 mg by mouth 2  (two) times daily.   Yes [provider]  mirtazapine (REMERON) 15 MG tablet Take 15 mg by mouth at bedtime.   Yes [provider]  ondansetron (ZOFRAN) 4 MG tablet Take 1 tablet (4 mg total) by mouth every 6 (six) hours as needed for nausea or vomiting. 10/02/23  Yes Al Decant, PA-C  pantoprazole (PROTONIX) 40 MG tablet TAKE 1 TABLET BY MOUTH ONCE DAILY 30 MINTUES BEFORE BREAKFAST. Patient taking differently: Take 40 mg by mouth daily before breakfast. TAKE 1 TABLET BY MOUTH ONCE DAILY 30 MINTUES BEFORE BREAKFAST. 05/23/20  Yes Anice Paganini, NP  PARoxetine (PAXIL) 40 MG tablet Take 1 tablet (40 mg total) by mouth daily. 02/20/21  Yes Hisada, Barbee Cough, MD  polyethylene glycol powder (GLYCOLAX/MIRALAX) 17 GM/SCOOP powder Take 17 g by mouth daily.   Yes [provider]  promethazine (PHENERGAN) 12.5 MG suppository Place 12.5 mg rectally every 6 (six) hours as needed for nausea or vomiting.   Yes [provider]  QUEtiapine (SEROQUEL XR) 200 MG 24 hr tablet Take 200 mg by mouth at bedtime.   Yes [provider]  rosuvastatin (CRESTOR) 40 MG tablet Take 1 tablet (40 mg total) by mouth daily. 01/12/22  Yes Conte, Tessa N, PA-C  saccharomyces boulardii (FLORASTOR) 250 MG capsule Take 250 mg by mouth daily.   Yes [provider]  senna-docusate (SENOKOT-S) 8.6-50 MG tablet Take 1 tablet by mouth at bedtime as needed for mild constipation. 09/04/21  Yes Johnson, Clanford L, MD  Sodium Phosphates (RA SALINE ENEMA RE) Place 1 Dose rectally once as needed (severe constipation). If no relief from both milk of magnesia, and Bisacodyl suppository, administer one dose of disposable saline enema.   Yes [provider]  vitamin B-12 (CYANOCOBALAMIN) 500 MCG tablet Take 500 mcg by mouth daily.   Yes [provider]  Nutritional Supplements (NUTRITIONAL SUPPLEMENT PO) Take 1 each by mouth daily. Manufacturing engineer, Historical, MD     Physical Exam: Vitals:   11/27/23 1326 11/27/23 1330 11/27/23 1555 11/27/23 1556  BP:  (!) 134/54 (!) 109/59   Pulse:  (!) 101 (!) 105   Resp:  13 18   Temp: 97.6 F (36.4 C)   97.7 F (36.5 C)  TempSrc: Oral   Oral  SpO2:  95% 93%   Weight:      Height:        Physical Exam Constitutional:  General: She is not in acute distress.    Appearance: Normal appearance.  HENT:     Head: Normocephalic and atraumatic.     Mouth/Throat:     Mouth: Mucous membranes are moist.     Pharynx: Oropharynx is clear.  Eyes:     Extraocular Movements: Extraocular movements intact.     Pupils: Pupils are equal, round, and reactive to light.  Cardiovascular:     Rate and Rhythm: Normal rate and regular rhythm.     Pulses: Normal pulses.     Heart sounds: Normal heart sounds.  Pulmonary:     Effort: Pulmonary effort is normal. No respiratory distress.     Breath sounds: Normal breath sounds.  Abdominal:     General: Bowel sounds are normal. There is no distension.     Palpations: Abdomen is soft.     Tenderness: There is no abdominal tenderness.  Musculoskeletal:        General: No swelling or deformity.  Skin:    General: Skin is warm and dry.  Neurological:     General: No focal deficit present.     Mental Status: Mental status is at baseline.    Labs on Admission: I have personally reviewed following labs and imaging studies  CBC: Recent Labs  Lab 11/27/23 0800 11/27/23 0832  WBC 10.2  --   NEUTROABS 7.6  --   HGB 12.4 12.6  HCT 38.1 37.0  MCV 94.8  --   PLT 232  --     Basic Metabolic Panel: Recent Labs  Lab 11/27/23 0800 11/27/23 0832  NA 141 142  K 4.2 4.1  CL 98 101  CO2 29  --   GLUCOSE 220* 221*  BUN 17 19  CREATININE 0.99 1.00  CALCIUM 9.8  --     GFR: Estimated Creatinine Clearance: 58.9 mL/min (by C-G formula based on SCr of 1 mg/dL).  Liver Function Tests: Recent Labs  Lab 11/27/23 0800  AST 25  ALT 27  ALKPHOS 87  BILITOT 0.6   PROT 5.9*  ALBUMIN 3.2*    Urine analysis:    Component Value Date/Time   COLORURINE YELLOW 11/27/2023 1205   APPEARANCEUR CLEAR 11/27/2023 1205   LABSPEC 1.021 11/27/2023 1205   PHURINE 8.0 11/27/2023 1205   GLUCOSEU NEGATIVE 11/27/2023 1205   HGBUR NEGATIVE 11/27/2023 1205   BILIRUBINUR NEGATIVE 11/27/2023 1205   KETONESUR NEGATIVE 11/27/2023 1205   PROTEINUR 30 (A) 11/27/2023 1205   UROBILINOGEN 1.0 01/10/2015 1754   NITRITE NEGATIVE 11/27/2023 1205   LEUKOCYTESUR NEGATIVE 11/27/2023 1205    Radiological Exams on Admission: CT ABDOMEN PELVIS W CONTRAST Result Date: 11/27/2023 CLINICAL DATA:  Abdominal pain.  Nausea vomiting. EXAM: CT ABDOMEN AND PELVIS WITH CONTRAST TECHNIQUE: Multidetector CT imaging of the abdomen and pelvis was performed using the standard protocol following bolus administration of intravenous contrast. RADIATION DOSE REDUCTION: This exam was performed according to the departmental dose-optimization program which includes automated exposure control, adjustment of the mA and/or kV according to patient size and/or use of iterative reconstruction technique. CONTRAST:  75mL OMNIPAQUE IOHEXOL 350 MG/ML SOLN COMPARISON:  10/02/2023 and older exams. FINDINGS: Lower chest: No acute abnormality. Hepatobiliary: Liver normal in size and attenuation. Scattered calcifications consistent with healed granuloma, stable. No mass. Status post cholecystectomy. No bile duct dilation. Pancreas: No pancreatic mass or inflammation. Small medial duodenal diverticulum adjacent to the anterior elevator. This is stable. Spleen: Normal. Adrenals/Urinary Tract: No adrenal masses. Kidneys normal in size, orientation and position.  Tiny intrarenal stone in the upper pole the left kidney. Small mid to lower pole right intrarenal stones. No hydronephrosis. Ureters are normal in course and in caliber. Bladder is unremarkable. Stomach/Bowel: Normal stomach. Small bowel and colon are normal in caliber. No  wall thickening. No inflammation. Scattered left colon diverticula. Normal appendix. Vascular/Lymphatic: Aortic atherosclerosis. No aneurysm. No enlarged lymph nodes. Reproductive: Uterus and bilateral adnexa are unremarkable. Other: No abdominal wall hernia or abnormality. No abdominopelvic ascites. Musculoskeletal: No fracture or acute finding.  No bone lesion. IMPRESSION: 1. No acute findings within the abdomen or pelvis. No findings to account for the patient's symptoms. 2. Aortic atherosclerosis. Aortic Atherosclerosis (ICD10-I70.0). Electronically Signed   By: Amie Portland M.D.   On: 11/27/2023 12:27   DG Chest Port 1 View Result Date: 11/27/2023 CLINICAL DATA:  Hematemesis. EXAM: PORTABLE CHEST 1 VIEW COMPARISON:  09/02/2023 FINDINGS: Lungs are adequately inflated without focal airspace consolidation or effusion. Cardiomediastinal silhouette and remainder of the exam is unchanged. IMPRESSION: No active disease. Electronically Signed   By: Elberta Fortis M.D.   On: 11/27/2023 08:51    EKG: Independently reviewed.  Sinus tachycardia at 106 bpm.  Nonspecific T wave changes.  Minimal baseline wander.  Assessment/Plan Principal Problem:   Hematemesis Active Problems:   PAF (paroxysmal atrial fibrillation) (HCC)   Irritable bowel syndrome   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 (HCC)   Essential hypertension, benign   Primary hypothyroidism   PTSD (post-traumatic stress disorder)   Obesity (BMI 30-39.9)   COPD (chronic obstructive pulmonary disease) (HCC)   GERD (gastroesophageal reflux disease)   Anxiety   History of stroke   (HFpEF) heart failure with preserved ejection fraction (HCC)   Dementia (HCC)   CKD (chronic kidney disease) stage 3, GFR 30-59 ml/min (HCC)   Prolonged QT interval   Bipolar 1 disorder, depressed (HCC)   Dyslipidemia   MDD (major depressive disorder), recurrent severe, without psychosis (HCC)   History of gout   Hematemesis > As per HPI there was  concern for hematemesis back in November but appear to be only dark vomit without blood and was ultimately discharged home from the ED. > Now with recurrent episodes of hematemesis at facility.  No samples for testing in the ED.  ED provider did note coffee-ground emesis dried around patient's mouth when patient first arrived however this was unable to be saved for testing. > Hemoglobin remained stable.  No recurrent nausea or vomiting in the ED after Zofran administration. > Patient received IV PPI and a liter of IV fluids.  GI consulted and will see the patient with plans for possible EGD in the morning. - Monitor on telemetry overnight - Appreciate GI recommendations and assistance - Trend hemoglobin overnight - Continue with IV PPI - Liquid diet - NPO MN  Hypertension - Holding Lasix which is taken every other day - Continue home diltiazem  Hypothyroidism - Continue home Synthroid  Hyperlipidemia - Continue rosuvastatin  GERD - IV PPI as above  History of CVA - Holding ASA - Continue home rosuvastatin  Atrial fibrillation - Continue home diltiazem - Not currently on anticoagulation  Chronic diastolic CHF History of pericardial effusion and pericarditis > Last echo was in 2023 with EF 60-65%, normal RV function, no comment on diastolic function.  No pericardial effusion at that time.  Limited echo. - Takes Lasix every other day which is being held for now  CKD 3A > Creatinine stable in the ED. - Trend  renal function and electrolytes  Diabetes > 10 units twice daily at facility with SSI. - SSI  Depression Anxiety Bipolar PTSD Dementia - Continue home Namenda, paroxetine, mirtazapine, Seroquel  COPD - Not currently prescribed any daily inhalers  Gout - Continue home allopurinol  Obesity - Noted  History of ischemic colitis History of IBS History of QT prolongation - Noted  DVT prophylaxis: SCDs Code Status:   Full Family Communication:  Son Updated  by phone  Disposition Plan:   Patient is from:  SNF  Anticipated DC to:  Same as above  Anticipated DC date:  1 to 4 days  Anticipated DC barriers: None  Consults called:  Gastroenterology Admission status:  Observation, telemetry  Severity of Illness: The appropriate patient status for this patient is OBSERVATION. Observation status is judged to be reasonable and necessary in order to provide the required intensity of service to ensure the patient's safety. The patient's presenting symptoms, physical exam findings, and initial radiographic and laboratory data in the context of their medical condition is felt to place them at decreased risk for further clinical deterioration. Furthermore, it is anticipated that the patient will be medically stable for discharge from the hospital within 2 midnights of admission.    Synetta Fail MD [facility]  How to contact the [facility] Attending or Consulting provider 7A - 7P or covering provider during after hours 7P -7A, for this patient?   Check the care team in [facility] and look for a) attending/consulting [facility] provider listed and b) the [facility] team listed Log into [email or web address] and use 's universal password to access. If you do not have the password, please contact the hospital operator. Locate the [facility] provider you are looking for under [facility] and page to a number that you can be directly reached. If you still have difficulty reaching the provider, please page the [facility] (Director on Call) for the Hospitalists listed on amion for assistance.  11/27/2023, 4:00 PM

## 2023-11-27 NOTE — ED Provider Notes (Incomplete)
[place] EMERGENCY DEPARTMENT AT [facility] Provider Note   CSN: [id number] Arrival date & time: 11/27/23  0745     History {Add pertinent medical, surgical, social history, OB history to HPI:1} Chief Complaint  Patient presents with   Hematemesis    Claudia Keller is a 73 y.o. female with dementia, COPD, hypertension, hypothyroidism, IBS, HLD, T2DM, diverticulosis who presents with hematemesis that started last night at 11 pm. Nursing home gave ger zofran and it started again at 7am, pt has vomited 5 times since. She endorses nausea now as well as abdominal cramping but denies abdominal pain or chest pain. Denies f/c. Endorses a large brown bowel movement, as staff at [facility] gave her a suppository last night. No blood in the bowel movement. She denies f/c, urinary sxs.   Per chart review presented in November for nausea vomiting as well, with some concern for blood in the vomit however there was none noted by the EDP. Found to have UTI at that time.   Past Medical History:  Diagnosis Date   Arthritis    COPD (chronic obstructive pulmonary disease) (HCC)    Diverticulosis    Essential hypertension, benign    Hx of colonic polyps    Hypothyroidism    IBS (irritable bowel syndrome)    Ketoacidosis    Mixed hyperlipidemia    PTSD (post-traumatic stress disorder)    S/P colonoscopy 11/2009   RMR: pancolonic diverticula, polypoid rectal mucosa with  prominent lymphoid aggregates, repeat in Jan 2016    Stress incontinence, female    Tobacco use    Type 2 diabetes mellitus (HCC)        Home Medications Prior to Admission medications   Medication Sig Start Date End Date Taking? Authorizing Provider  acetaminophen (TYLENOL) 325 MG tablet Take 650 mg by mouth every 8 (eight) hours as needed for mild pain or moderate pain.    [provider]  albuterol (VENTOLIN HFA) 108 (90 Base) MCG/ACT inhaler Inhale 2 puffs into the lungs every 4 (four) hours as needed  for wheezing or shortness of breath. 12/03/20   Sherryll Burger, Pratik D, DO  allopurinol (ZYLOPRIM) 100 MG tablet Take 50 mg by mouth daily. Idiopathic gout    [provider]  amoxicillin-clavulanate (AUGMENTIN) 875-125 MG tablet Take 1 tablet by mouth every 12 (twelve) hours. 09/02/23   Wynetta Fines, MD  aspirin 81 MG chewable tablet Chew 81 mg by mouth daily.    [provider]  bisacodyl (DULCOLAX) 10 MG suppository Place 10 mg rectally daily as needed (constipation not relieved by MOM).    [provider]  Calcium Carb-Cholecalciferol (OYSTER SHELL CALCIUM W/D) 500-200 MG-UNIT TABS Take 1 tablet by mouth 2 (two) times daily. 11/10/20   [provider]  carbamide peroxide (DEBROX) 6.5 % OTIC solution Place 3 drops into both ears once a week. Every Monday    [provider]  cephALEXin (KEFLEX) 500 MG capsule Take 1 capsule (500 mg total) by mouth 4 (four) times daily. 10/02/23   Al Decant, PA-C  colchicine 0.6 MG tablet Take 1 tablet (0.6 mg total) by mouth daily. 11/11/21 11/08/48  Marrion Coy, MD  colestipol (COLESTID) 1 g tablet TAKE 2 TABLETS DAILY FOR DIARRHEA. DO NOT TAKE WITHIN 2 HOURS OF OTHER ORAL MEDICATIONS. HOLD FOR CONSTIPATION. Patient taking differently: Take 2 g by mouth daily. TAKE 2 TABLETS DAILY FOR DIARRHEA. DO NOT TAKE WITHIN 2 HOURS OF OTHER ORAL MEDICATIONS. HOLD FOR CONSTIPATION.  05/23/20   Anice Paganini, NP  diltiazem (DILACOR XR) 180 MG 24 hr capsule Take 180 mg by mouth daily.    [provider]  divalproex (DEPAKOTE ER) 500 MG 24 hr tablet Take 2 tablets (1,000 mg total) by mouth at bedtime. 11/11/22   Lewie Chamber, MD  Dulaglutide (TRULICITY) 1.5 MG/0.5ML SOAJ Inject 1.5 mg into the skin once a week. Every Wednesday    [provider]  furosemide (LASIX) 20 MG tablet Take 1 tablet (20 mg total) by mouth every other day. 09/07/21   Johnson, Clanford L, MD  insulin aspart (NOVOLOG) 100 UNIT/ML injection Inject  0-10 Units into the skin in the morning, at noon, in the evening, and at bedtime. BS 0-150 inject 0 units  BS 151-200 inject 2 units  BS 201-250 inject 3 units  BS 251-300 inject 5 units  BS 301-350 inject 7 units  BS 351- 400 inject 10 units  BS [phone] call MD    [provider]  insulin detemir (LEVEMIR) 100 UNIT/ML FlexPen Inject 8 Units into the skin daily. Patient not taking: Reported on 09/02/2023 11/10/22   Lewie Chamber, MD  Insulin Glargine Solostar (LANTUS) 100 UNIT/ML Solostar Pen Inject 10 Units into the skin in the morning and at bedtime.    [provider]  ipratropium-albuterol (DUONEB) 0.5-2.5 (3) MG/3ML SOLN Take 3 mLs by nebulization every 4 (four) hours as needed (SOB/wheezing).    [provider]  levothyroxine (SYNTHROID) 125 MCG tablet Take 125 mcg by mouth daily before breakfast.    [provider]  levothyroxine (SYNTHROID) 150 MCG tablet Take 150 mcg by mouth daily. Patient not taking: Reported on 09/02/2023 11/26/22   [provider]  magnesium hydroxide (MILK OF MAGNESIA) 400 MG/5ML suspension Take 30 mLs by mouth daily as needed (if no BM in 3 days).    [provider]  memantine (NAMENDA) 10 MG tablet Take 10 mg by mouth 2 (two) times daily.    [provider]  mirtazapine (REMERON) 15 MG tablet Take 15 mg by mouth at bedtime.    [provider]  Nutritional Supplements (NUTRITIONAL SUPPLEMENT PO) Take 1 each by mouth daily. Magic Cup    [provider]  ondansetron (ZOFRAN) 4 MG tablet Take 1 tablet (4 mg total) by mouth every 6 (six) hours as needed for nausea or vomiting. 10/02/23   Al Decant, PA-C  pantoprazole (PROTONIX) 40 MG tablet TAKE 1 TABLET BY MOUTH ONCE DAILY 30 MINTUES BEFORE BREAKFAST. Patient taking differently: Take 40 mg by mouth daily before breakfast. TAKE 1 TABLET BY MOUTH ONCE DAILY 30 MINTUES BEFORE BREAKFAST. 05/23/20   Anice Paganini, NP  PARoxetine  (PAXIL) 40 MG tablet Take 1 tablet (40 mg total) by mouth daily. 02/20/21   Neysa Hotter, MD  Pollen Extracts (PROSTAT PO) Take 1 Dose by mouth daily.    [provider]  polyethylene glycol powder (GLYCOLAX/MIRALAX) 17 GM/SCOOP powder Take 17 g by mouth daily.    [provider]  QUEtiapine (SEROQUEL XR) 200 MG 24 hr tablet Take 200 mg by mouth at bedtime.    [provider]  rosuvastatin (CRESTOR) 40 MG tablet Take 1 tablet (40 mg total) by mouth daily. 01/12/22   Sharlene Dory, PA-C  senna-docusate (SENOKOT-S) 8.6-50 MG tablet Take 1 tablet by mouth at bedtime as needed for mild constipation. 09/04/21   Johnson, Clanford L, MD  Sodium Phosphates (RA SALINE ENEMA RE) Place 1 Dose rectally once  as needed (severe constipation). If no relief from both milk of magnesia, and Bisacodyl suppository, administer one dose of disposable saline enema.    [provider]  UNABLE TO FIND Take 1 Dose by mouth in the morning and at bedtime. Med Name: Med pass    [provider]  vitamin B-12 (CYANOCOBALAMIN) 500 MCG tablet Take 500 mcg by mouth daily.    [provider]      Allergies    Patient has no known allergies.    Review of Systems   Review of Systems A 10 point review of systems was performed and is negative unless otherwise reported in HPI.  Physical Exam Updated Vital Signs Ht 5\' 5"  (1.651 m)   Wt 95.3 kg   BMI 34.95 kg/m  Physical Exam General: Normal appearing elderly female, lying in bed.  HEENT: PERRLA, Sclera anicteric, dry mucous membranes, trachea midline. Has dried emesis around mouth that does appear to be coffee ground emesis.  Cardiology: RRR, no murmurs/rubs/gallops.   Resp: Normal respiratory rate and effort. CTAB, no wheezes, rhonchi, crackles.  Abd: Soft, non-tender, non-distended. No rebound tenderness or guarding.  GU: Deferred. MSK: No peripheral edema or signs of trauma. Extremities without deformity or TTP. No  cyanosis or clubbing. Skin: pale, warm, dry.  Neuro: A&Ox4, CNs II-XII grossly intact. MAEs. Sensation grossly intact.  Psych: Normal mood and affect.   ED Results / Procedures / Treatments   Labs (all labs ordered are listed, but only abnormal results are displayed) Labs Reviewed - No data to display  EKG None  Radiology No results found.  Procedures Procedures  {Document cardiac monitor, telemetry assessment procedure when appropriate:1}  Medications Ordered in ED Medications - No data to display  ED Course/ Medical Decision Making/ A&P                          Medical Decision Making Amount and/or Complexity of Data Reviewed Labs: ordered. Radiology: ordered.  Risk Prescription drug management.    This patient presents to the ED for concern of N/V w/ c/f hematemesis, this involves an extensive number of treatment options, and is a complaint that carries with it a high risk of complications and morbidity.  I considered the following differential and admission for this acute, potentially life threatening condition.   MDM:    DDx UGIB: PUD (consider H pylori vs NSAID use vs other diet etiologies), arteriovenous malformations (AVMs) vs Dieulafoy lesions vs Mallory-Weiss tears vs malignant lesions vs Boerhaave's. Based on presentation, lower suspicion for Boerhaave's based on clinical exam findings and history.  Patient had similar presentation in November, was found to have no hematemesis but just UTI, will get UA today too and test gastric contents for blood if vomits in ED. Has dried emesis around mouth that does appear to be coffee ground emesis. EKG shows no prolonged QT at this time so is given zofran IV on arrival.  Update and Plan: -[place] Score:  4-5 -two large bore IVs, on the monitor -cbc, cmp, coags, type and screen, mag, UA -starting on protonix as well as 1L IVF bolus     Labs: I Ordered, and personally interpreted labs.  The pertinent results include:   those listed above  Imaging Studies ordered: I ordered imaging studies including CXR I independently visualized and interpreted imaging. I agree with the radiologist interpretation  Additional history obtained from chart review, EMS.   Cardiac Monitoring: The patient was maintained on  a cardiac monitor.  I personally viewed and interpreted the cardiac monitored which showed an underlying rhythm of: sinus tachycardia  Reevaluation: After the interventions noted above, I reevaluated the patient and found that they have :improved  Social Determinants of Health: Lives at [facility]  Disposition:  ***  Co morbidities that complicate the patient evaluation  Past Medical History:  Diagnosis Date   Arthritis    COPD (chronic obstructive pulmonary disease) (HCC)    Diverticulosis    Essential hypertension, benign    Hx of colonic polyps    Hypothyroidism    IBS (irritable bowel syndrome)    Ketoacidosis    Mixed hyperlipidemia    PTSD (post-traumatic stress disorder)    S/P colonoscopy 11/2009   RMR: pancolonic diverticula, polypoid rectal mucosa with  prominent lymphoid aggregates, repeat in Jan 2016    Stress incontinence, female    Tobacco use    Type 2 diabetes mellitus (HCC)      Medicines No orders of the defined types were placed in this encounter.   I have reviewed the patients home medicines and have made adjustments as needed  Problem List / ED Course: Problem List Items Addressed This Visit   None        {Document critical care time when appropriate:1} {Document review of labs and clinical decision tools ie heart score, Chads2Vasc2 etc:1}  {Document your independent review of radiology images, and any outside records:1} {Document your discussion with family members, caretakers, and with consultants:1} {Document social determinants of health affecting pt's care:1} {Document your decision making why or why not admission, treatments were needed:1}  This  note was created using dictation software, which may contain spelling or grammatical errors.

## 2023-11-27 NOTE — ED Notes (Signed)
Patient is resting comfortably. 

## 2023-11-27 NOTE — ED Notes (Signed)
Pt had large light brown bowel movement. RN and NT cleaned pt at this time.

## 2023-11-28 ENCOUNTER — Encounter (HOSPITAL_COMMUNITY)
Admission: EM | Disposition: A | Discharge status: Skilled Nursing Facility | Payer: Self-pay | Source: Home / Self Care | Attending: Emergency Medicine

## 2023-11-28 ENCOUNTER — Observation Stay (HOSPITAL_COMMUNITY): Payer: Medicare Other | Admitting: Certified Registered Nurse Anesthetist

## 2023-11-28 ENCOUNTER — Observation Stay (HOSPITAL_BASED_OUTPATIENT_CLINIC_OR_DEPARTMENT_OTHER): Payer: Medicare Other | Admitting: Certified Registered Nurse Anesthetist

## 2023-11-28 ENCOUNTER — Encounter (HOSPITAL_COMMUNITY): Payer: Self-pay | Admitting: Internal Medicine

## 2023-11-28 DIAGNOSIS — R9431 Abnormal electrocardiogram [ECG] [EKG]: Secondary | ICD-10-CM | POA: Diagnosis not present

## 2023-11-28 DIAGNOSIS — K2101 Gastro-esophageal reflux disease with esophagitis, with bleeding: Secondary | ICD-10-CM | POA: Diagnosis not present

## 2023-11-28 DIAGNOSIS — K2950 Unspecified chronic gastritis without bleeding: Secondary | ICD-10-CM

## 2023-11-28 DIAGNOSIS — K2100 Gastro-esophageal reflux disease with esophagitis, without bleeding: Secondary | ICD-10-CM

## 2023-11-28 DIAGNOSIS — K2970 Gastritis, unspecified, without bleeding: Secondary | ICD-10-CM

## 2023-11-28 DIAGNOSIS — K920 Hematemesis: Secondary | ICD-10-CM | POA: Diagnosis not present

## 2023-11-28 DIAGNOSIS — I5032 Chronic diastolic (congestive) heart failure: Secondary | ICD-10-CM

## 2023-11-28 DIAGNOSIS — K2961 Other gastritis with bleeding: Secondary | ICD-10-CM

## 2023-11-28 DIAGNOSIS — J449 Chronic obstructive pulmonary disease, unspecified: Secondary | ICD-10-CM

## 2023-11-28 DIAGNOSIS — I480 Paroxysmal atrial fibrillation: Secondary | ICD-10-CM | POA: Diagnosis not present

## 2023-11-28 DIAGNOSIS — K449 Diaphragmatic hernia without obstruction or gangrene: Secondary | ICD-10-CM | POA: Diagnosis not present

## 2023-11-28 DIAGNOSIS — F419 Anxiety disorder, unspecified: Secondary | ICD-10-CM

## 2023-11-28 HISTORY — PX: ESOPHAGOGASTRODUODENOSCOPY (EGD) WITH PROPOFOL: SHX5813

## 2023-11-28 HISTORY — PX: BIOPSY: SHX5522

## 2023-11-28 LAB — CBC
HCT: 30.6 % — ABNORMAL LOW (ref 36.0–46.0)
Hemoglobin: 10 g/dL — ABNORMAL LOW (ref 12.0–15.0)
MCH: 31.1 pg (ref 26.0–34.0)
MCHC: 32.7 g/dL (ref 30.0–36.0)
MCV: 95 fL (ref 80.0–100.0)
Platelets: 148 10*3/uL — ABNORMAL LOW (ref 150–400)
RBC: 3.22 MIL/uL — ABNORMAL LOW (ref 3.87–5.11)
RDW: 14.6 % (ref 11.5–15.5)
WBC: 7.4 10*3/uL (ref 4.0–10.5)
nRBC: 0 % (ref 0.0–0.2)

## 2023-11-28 LAB — COMPREHENSIVE METABOLIC PANEL
ALT: 20 U/L (ref 0–44)
AST: 18 U/L (ref 15–41)
Albumin: 2.7 g/dL — ABNORMAL LOW (ref 3.5–5.0)
Alkaline Phosphatase: 67 U/L (ref 38–126)
Anion gap: 6 (ref 5–15)
BUN: 11 mg/dL (ref 8–23)
CO2: 28 mmol/L (ref 22–32)
Calcium: 9.5 mg/dL (ref 8.9–10.3)
Chloride: 105 mmol/L (ref 98–111)
Creatinine, Ser: 0.92 mg/dL (ref 0.44–1.00)
GFR, Estimated: >60 mL/min (ref >60)
Glucose, Bld: 97 mg/dL (ref 70–99)
Potassium: 3.8 mmol/L (ref 3.5–5.1)
Sodium: 139 mmol/L (ref 135–145)
Total Bilirubin: 0.6 mg/dL (ref 0.0–1.2)
Total Protein: 5.1 g/dL — ABNORMAL LOW (ref 6.5–8.1)

## 2023-11-28 LAB — GLUCOSE, CAPILLARY
Glucose-Capillary: 137 mg/dL — ABNORMAL HIGH (ref 70–99)
Glucose-Capillary: 90 mg/dL (ref 70–99)
Glucose-Capillary: 103 mg/dL — ABNORMAL HIGH (ref 70–99)
Glucose-Capillary: 111 mg/dL — ABNORMAL HIGH (ref 70–99)
Glucose-Capillary: 194 mg/dL — ABNORMAL HIGH (ref 70–99)
Glucose-Capillary: 175 mg/dL — ABNORMAL HIGH (ref 70–99)

## 2023-11-28 LAB — FERRITIN: Ferritin: 29 ng/mL (ref 11–307)

## 2023-11-28 LAB — ABO/RH: ABO/RH(D): O POS

## 2023-11-28 LAB — IRON AND TIBC
Iron: 52 ug/dL (ref 28–170)
Saturation Ratios: 14 % (ref 10.4–31.8)
TIBC: 364 ug/dL (ref 250–450)
UIBC: 312 ug/dL

## 2023-11-28 LAB — VITAMIN B12: Vitamin B-12: 1388 pg/mL — ABNORMAL HIGH (ref 180–914)

## 2023-11-28 LAB — CBG MONITORING, ED: Glucose-Capillary: 117 mg/dL — ABNORMAL HIGH (ref 70–99)

## 2023-11-28 LAB — FOLATE: Folate: 29.3 ng/mL (ref >5.9)

## 2023-11-28 SURGERY — ESOPHAGOGASTRODUODENOSCOPY (EGD) WITH PROPOFOL
Anesthesia: General

## 2023-11-28 MED ORDER — PROPOFOL 500 MG/50ML IV EMUL
INTRAVENOUS | Status: DC | PRN
  Administered 2023-11-28: 150 ug/kg/min via INTRAVENOUS

## 2023-11-28 MED ORDER — SUCRALFATE 1 GM/10ML PO SUSP
1.0000 g | Freq: Three times a day (TID) | ORAL | Status: DC
  Administered 2023-11-28 – 2023-11-29 (×4): 1 g via ORAL
  Filled 2023-11-28 (×6): qty 10

## 2023-11-28 MED ORDER — PANTOPRAZOLE SODIUM 40 MG PO TBEC
40.0000 mg | DELAYED_RELEASE_TABLET | Freq: Two times a day (BID) | ORAL | Status: DC
  Administered 2023-11-28 – 2023-11-29 (×2): 40 mg via ORAL
  Filled 2023-11-28 (×2): qty 1

## 2023-11-28 MED ORDER — PROPOFOL 10 MG/ML IV BOLUS
INTRAVENOUS | Status: DC | PRN
  Administered 2023-11-28: 100 mg via INTRAVENOUS

## 2023-11-28 MED ORDER — PHENYLEPHRINE 80 MCG/ML (10ML) SYRINGE FOR IV PUSH (FOR BLOOD PRESSURE SUPPORT)
PREFILLED_SYRINGE | INTRAVENOUS | Status: DC | PRN
  Administered 2023-11-28: 80 ug via INTRAVENOUS

## 2023-11-28 MED ORDER — SODIUM CHLORIDE 0.9 % IV SOLN: INTRAVENOUS | Status: DC | PRN

## 2023-11-28 MED ORDER — SUCCINYLCHOLINE CHLORIDE 200 MG/10ML IV SOSY
PREFILLED_SYRINGE | INTRAVENOUS | Status: DC | PRN
  Administered 2023-11-28: 140 mg via INTRAVENOUS

## 2023-11-28 MED ORDER — FUROSEMIDE 20 MG PO TABS
20.0000 mg | ORAL_TABLET | ORAL | Status: DC
  Administered 2023-11-29: 20 mg via ORAL
  Filled 2023-11-28: qty 1

## 2023-11-28 SURGICAL SUPPLY — 14 items

## 2023-11-28 NOTE — Anesthesia Preprocedure Evaluation (Signed)
Anesthesia Evaluation  Patient identified by MRN, date of birth, ID band Patient awake    Reviewed: Allergy & Precautions, NPO status , Patient's Chart, lab work & pertinent test results  History of Anesthesia Complications Negative for: history of anesthetic complications  Airway Mallampati: III  TM Distance: >3 FB Neck ROM: Full    Dental  (+) Edentulous Upper, Edentulous Lower, Dental Advisory Given   Pulmonary neg shortness of breath, COPD, neg recent URI, Current Smoker and Patient abstained from smoking.   breath sounds clear to auscultation       Cardiovascular hypertension, Pt. on medications (-) angina (-) CHF  Rhythm:Regular   1. Left ventricular ejection fraction, by estimation, is 60 to 65%. The  left ventricle has normal function.   2. Right ventricular systolic function is normal.   3. There is no evidence of cardiac tamponade.     Neuro/Psych negative neurological ROS     GI/Hepatic Neg liver ROS,GERD  ,,  Endo/Other  diabetesHypothyroidism    Renal/GU Renal diseaseLab Results      Component                Value               Date                      NA                       139                 11/28/2023                K                        3.8                 11/28/2023                CO2                      28                  11/28/2023                GLUCOSE                  97                  11/28/2023                BUN                      11                  11/28/2023                CREATININE               0.92                11/28/2023                CALCIUM                  9.5                 11/28/2023  GFRNONAA                 >60                 11/28/2023                Musculoskeletal   Abdominal   Peds  Hematology Lab Results      Component                Value               Date                      WBC                      7.4                 11/28/2023                 HGB                      10.0 (L)            11/28/2023                HCT                      30.6 (L)            11/28/2023                MCV                      95.0                11/28/2023                PLT                      148 (L)             11/28/2023              Anesthesia Other Findings   Reproductive/Obstetrics                              Anesthesia Physical Anesthesia Plan  ASA: 3  Anesthesia Plan: General   Post-op Pain Management:    Induction: Intravenous  PONV Risk Score and Plan: 2 and Ondansetron and Dexamethasone  Airway Management Planned: Oral ETT  Additional Equipment: None  Intra-op Plan:   Post-operative Plan: Extubation in OR  Informed Consent: I have reviewed the patients History and Physical, chart, labs and discussed the procedure including the risks, benefits and alternatives for the proposed anesthesia with the patient or authorized representative who has indicated his/her understanding and acceptance.     Dental advisory given  Plan Discussed with: CRNA  Anesthesia Plan Comments:          Anesthesia Quick Evaluation

## 2023-11-28 NOTE — Op Note (Signed)
[facility] Patient Name: Claudia Keller Procedure Date : 11/28/2023 MRN: [id number] Attending MD: Lynann Bologna , MD, [id number] Date of Birth: 10-24-1951 CSN: [id number] Age: 73 Admit Type: Inpatient Procedure:                Upper GI endoscopy Indications:              Coffee-ground emesis with Hb 12 to 10 Providers:                Lynann Bologna, MD, Suzy Bouchard, RN, Sunday Corn                            [place], Technician Referring MD:              Medicines:                GA-as patient was on Trulicity with last dose 5                            days ago Complications:            No immediate complications. Estimated Blood Loss:     Estimated blood loss: none. Procedure:                Pre-Anesthesia Assessment:                           - Prior to the procedure, a History and Physical                            was performed, and patient medications and                            allergies were reviewed. The patient's tolerance of                            previous anesthesia was also reviewed. The risks                            and benefits of the procedure and the sedation                            options and risks were discussed with the patient.                            All questions were answered, and informed consent                            was obtained. Prior Anticoagulants: The patient has                            taken no anticoagulant or antiplatelet agents. ASA                            Grade Assessment: III - A patient with severe  systemic disease. After reviewing the risks and                            benefits, the patient was deemed in satisfactory                            condition to undergo the procedure.                           After obtaining informed consent, the endoscope was                            passed under direct vision. Throughout the                            procedure, the patient's blood  pressure, pulse, and                            oxygen saturations were monitored continuously. The                            [id number]-[id number] ([id number]) Olympus endoscope was introduced                            through the mouth, and advanced to the second part                            of duodenum. The upper GI endoscopy was                            accomplished without difficulty. The patient                            tolerated the procedure well. Scope In: Scope Out: Findings:      LA Grade C (one or more mucosal breaks continuous between tops of 2 or       more mucosal folds, less than 75% circumference) esophagitis with no       bleeding was found 33 to 35 cm from the incisors. Biopsies were taken       with a cold forceps for histology.      A 2 cm hiatal hernia was present extending from 35 up to 37 cm (from GE       junction to diaphragmatic hiatus).      Localized minimal inflammation characterized by erythema was found in       the gastric antrum. Biopsies were taken with a cold forceps for       histology.      The examined duodenum was normal. Biopsies for histology were taken with       a cold forceps for evaluation of celiac disease. Impression:               - LA Grade C reflux esophagitis with no bleeding.                            Biopsied.                           -  2 cm hiatal hernia.                           - Gastritis. Biopsied.                           - Normal examined duodenum. Biopsied. Recommendation:           - Return patient to hospital ward for ongoing care.                           - Resume previous diet.                           - Use Protonix (pantoprazole) 40 mg PO BID for 8                            weeks, then once a day.                           - Use sucralfate suspension 1 gram PO QID for 2                            weeks.                           - Trend CBC. May benefit from IV iron prior to                            discharge.                            - If continued nausea/vomiting, then recommend                            solid-phase gastric emptying scan as outpatient off                            Trulicity.                           - If continued problems, can follow-up at                            [facility].                           - Will sign off for now. Pl call with any ?                           - The findings and recommendations were discussed                            with the patient's family. Procedure Code(s):        --- Professional ---  54098, Esophagogastroduodenoscopy, flexible,                            transoral; with biopsy, single or multiple Diagnosis Code(s):        --- Professional ---                           K21.00, Gastro-esophageal reflux disease with                            esophagitis, without bleeding                           K44.9, Diaphragmatic hernia without obstruction or                            gangrene                           K29.70, Gastritis, unspecified, without bleeding                           K92.0, Hematemesis CPT copyright 2022 [employer]. All rights reserved. The codes documented in this report are preliminary and upon coder review may  be revised to meet current compliance requirements. Lynann Bologna, MD 11/28/2023 11:19:46 AM This report has been signed electronically. Number of Addenda: 0

## 2023-11-28 NOTE — Plan of Care (Signed)
  Problem: Fluid Volume: Goal: Ability to maintain a balanced intake and output will improve Outcome: Progressing   Problem: Nutritional: Goal: Maintenance of adequate nutrition will improve Outcome: Progressing   Problem: Education: Goal: Knowledge of General Education information will improve Description: Including pain rating scale, medication(s)/side effects and non-pharmacologic comfort measures Outcome: Progressing

## 2023-11-28 NOTE — TOC Initial Note (Signed)
Transition of Care [facility]) - Initial/Assessment Note    Patient Details  Name: Claudia Keller MRN: [id number] Date of Birth: 1951/04/01  Transition [facility]) CM/SW Contact:    Lorri Frederick, LCSW Phone Number: 11/28/2023, 4:13 PM  Clinical Narrative:   CSW met with pt for initial assessment.  Pt reports she is in LTC at [facility] for the past 2 years, she does want to return at [facility].  Permission given to speak with son Bethann Berkshire.  CSW spoke with Bethann Berkshire, who confirms that he also wants pt to return to [facility].  CSW spoke with [facility], who requests FL2 be sent, can receive pt back tomorrow, is aware that there is no PT eval or recommendation for rehab.                  Expected Discharge Plan: Long Term Nursing Home Barriers to Discharge: Continued Medical Work up   Patient Goals and CMS Choice     Choice offered to / list presented to : Patient, Adult Children (son Bethann Berkshire)      Expected Discharge Plan and Services In-house Referral: Clinical Social Work   Post Acute Care Choice: Skilled Nursing Facility Living arrangements for the past 2 months: Skilled Biochemist, clinical)                                      Prior Living Arrangements/Services Living arrangements for the past 2 months: Skilled Nursing Facility Teacher, adult education) Lives with:: Facility Resident Patient language and need for interpreter reviewed:: No Do you feel safe going back to the place where you live?: Yes      Need for Family Participation in Patient Care: Yes (Comment) Care giver support system in place?: Yes (comment) Current home services: Other (comment) (na) Criminal Activity/Legal Involvement Pertinent to Current Situation/Hospitalization: No - Comment as needed  Activities of Daily Living   ADL Screening (condition at time of admission) Independently performs ADLs?: No Does the patient have a NEW difficulty with bathing/dressing/toileting/self-feeding that is  expected to last >3 days?: Yes (Initiates electronic notice to provider for possible OT consult) Does the patient have a NEW difficulty with getting in/out of bed, walking, or climbing stairs that is expected to last >3 days?: Yes (Initiates electronic notice to provider for possible PT consult) Does the patient have a NEW difficulty with communication that is expected to last >3 days?: No Is the patient deaf or have difficulty hearing?: No Does the patient have difficulty seeing, even when wearing glasses/contacts?: No Does the patient have difficulty concentrating, remembering, or making decisions?: No  Permission Sought/Granted Permission sought to share information with : Family Supports Permission granted to share information with : Yes, Verbal Permission Granted  Share Information with NAME: son Bethann Berkshire  Permission granted to share info w AGENCY: [facility]        Emotional Assessment Appearance:: Appears older than stated age Attitude/Demeanor/Rapport: Engaged Affect (typically observed): Pleasant, Appropriate Orientation: : Oriented to Self, Oriented to Place, Oriented to  Time, Oriented to Situation      Admission diagnosis:  Hematemesis [K92.0] Hematemesis with nausea [K92.0] Nausea and vomiting, unspecified vomiting type [R11.2] Patient Active Problem List   Diagnosis Date Noted   Hematemesis 11/27/2023   History of gout 03/01/2022   Avitaminosis D 02/22/2022   Unspecified protein-calorie malnutrition (HCC) 11/12/2021   PAF (paroxysmal atrial fibrillation) (HCC) 11/09/2021   Pericardial effusion    (  HFpEF) heart failure with preserved ejection fraction (HCC) 11/05/2021   History of pericarditis 11/05/2021   Dementia (HCC) 11/05/2021   CKD (chronic kidney disease) stage 3, GFR 30-59 ml/min (HCC) 11/05/2021   Prolonged QT interval 11/05/2021   B12 deficiency 09/04/2021   History of stroke 09/02/2021   Hypoalbuminemia 12/07/2020   Obesity (BMI 30-39.9) 12/07/2020    COPD (chronic obstructive pulmonary disease) (HCC) 12/07/2020   GERD (gastroesophageal reflux disease) 12/07/2020   Diabetic neuropathy (HCC) 12/07/2020   Anxiety 12/07/2020   Segmental colitis without complication (HCC)    Ischemic colitis (HCC) 03/19/2020   PTSD (post-traumatic stress disorder) 04/17/2018   Allergic rhinitis 11/06/2016   Bipolar 1 disorder, depressed (HCC) 11/05/2016   Dyslipidemia 11/05/2016   MDD (major depressive disorder), recurrent severe, without psychosis (HCC) 08/18/2016   Personal history of noncompliance with medical treatment, presenting hazards to health 08/17/2016   Emesis 05/20/2016   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 (HCC) 09/08/2015   Essential hypertension, benign 09/08/2015   Primary hypothyroidism 09/08/2015   Diverticulosis of colon without hemorrhage    Tobacco abuse 06/24/2011   Irritable bowel syndrome 11/12/2009   History of colonic polyps 11/12/2009   Closed fracture of metatarsal bone 11/30/2007   PCP:  Marinda Elk, MD Pharmacy:  No Pharmacies Listed    Social Drivers of Health (SDOH) Social History: SDOH Screenings   Food Insecurity: No Food Insecurity (11/28/2023)  Housing: Unknown (11/28/2023)  Transportation Needs: No Transportation Needs (11/28/2023)  Utilities: Not At Risk (11/28/2023)  Depression (PHQ2-9): Low Risk  (12/31/2021)  Social Connections: Unknown (11/28/2023)  Tobacco Use: High Risk (11/28/2023)   SDOH Interventions:     Readmission Risk Interventions     No data to display

## 2023-11-28 NOTE — Care Management Obs Status (Addendum)
MEDICARE OBSERVATION STATUS NOTIFICATION   Patient Details  Name: Claudia Keller MRN: [id number] Date of Birth: August 08, 1951   Medicare Observation Status Notification Given:  Yes  CSW also spoke with son Bethann Berkshire by phone and discussed MOON.  Lorri Frederick, LCSW 11/28/2023, 3:40 PM

## 2023-11-28 NOTE — Progress Notes (Signed)
Progress Note   Patient: Claudia Keller DOB: 06-25-1951 DOA: 11/27/2023     0 DOS: the patient was seen and examined on 11/28/2023   Brief hospital course: Claudia Keller is a 73 y.o. female with medical history significant of dementia, hypertension, hypothyroidism, hyperlipidemia, GERD, stroke, atrial fibrillation, CKD 3A, diabetes, pericardial effusion, CHF, gout, depression, anxiety, bipolar disorder, PTSD, COPD, obesity, ischemic colitis, IBS, QT prolongation presenting with hematemesis.   Assessment and Plan: Hematemesis Recurrent nausea, hematemesis with drop in hemoglobin. Hemoglobin remained stable. GI evaluation is appreciated.   S/p EGD this morning-which showed erosive esophagitis, 2 cm hiatal hernia. No active bleeding.  GI advised PPIs BID x 8 weeks, then maintenance every day and short-term Carafate with GI follow-up outpatient Continue to monitor H&H closely. Advance diet as tolerated.   Hypertension Continue Lasix which she takes every other day Continue home diltiazem   Hypothyroidism Home dose Synthroid resumed.   Hyperlipidemia Continue rosuvastatin   GERD IV PPI Q12   History of CVA Hold ASA Continue home rosuvastatin   Atrial fibrillation Continue home diltiazem Not currently on anticoagulation   Chronic diastolic CHF History of pericardial effusion and pericarditis Last echo was in 2023 with EF 60-65%, normal RV function, no comment on diastolic function.  No pericardial effusion at that time.  Limited echo. Takes Lasix every other day.   CKD 3A Creatinine stable in the ED. Trend renal function and electrolytes   Diabetes Continue 10 units twice daily at facility with SSI. Hypoglycemia protocol   Depression Anxiety Bipolar PTSD Dementia Continue home Namenda, paroxetine, mirtazapine, Seroquel   COPD Discharge needed   Gout Continue home allopurinol   Obesity BMI 34.95. Diet, exercise and weight reduction advised.        Out of bed to chair. Incentive spirometry. Nursing supportive care. Fall, aspiration precautions. DVT prophylaxis   Code Status: Full Code  Subjective: Patient is seen and examined today after EGD procedure. She is able to tolerate liquids. Nausea, epigastric pain better  Physical Exam: Vitals:   11/28/23 1123 11/28/23 1130 11/28/23 1140 11/28/23 1200  BP: (!) 150/58 (!) 132/56 (!) 145/64 (!) 138/59  Pulse: 92 92 90 89  Resp: 16 14 19    Temp: 97.6 F (36.4 C)   (!) 97.5 F (36.4 C)  TempSrc: Temporal   Oral  SpO2: 94% 93% 92% 92%  Weight:      Height:        General - Elderly ill Caucasian female, no apparent distress HEENT - PERRLA, EOMI, atraumatic head, non tender sinuses. Lung - Clear, basal crackles, no rhonchi, wheezes. Heart - S1, S2 heard, no murmurs, rubs, trace pedal edema. Abdomen - Soft, non tender, non distended, bowel sounds good Neuro - Alert, awake and oriented x 3, non focal exam. Skin - Warm and dry.  Data Reviewed:      Latest Ref Rng & Units 11/28/2023    8:25 AM 11/27/2023    5:55 PM 11/27/2023    8:32 AM  CBC  WBC 4.0 - 10.5 K/uL 7.4  13.2    Hemoglobin 12.0 - 15.0 g/dL 25.3  66.4  40.3   Hematocrit 36.0 - 46.0 % 30.6  33.6  37.0   Platelets 150 - 400 K/uL 148  180        Latest Ref Rng & Units 11/28/2023    8:25 AM 11/27/2023    8:32 AM 11/27/2023    8:00 AM  BMP  Glucose 70 - 99 mg/dL  97  221  220   BUN 8 - 23 mg/dL 11  19  17    Creatinine 0.44 - 1.00 mg/dL 1.61  0.96  0.45   Sodium 135 - 145 mmol/L 139  142  141   Potassium 3.5 - 5.1 mmol/L 3.8  4.1  4.2   Chloride 98 - 111 mmol/L 105  101  98   CO2 22 - 32 mmol/L 28   29   Calcium 8.9 - 10.3 mg/dL 9.5   9.8    CT ABDOMEN PELVIS W CONTRAST Result Date: 11/27/2023 CLINICAL DATA:  Abdominal pain.  Nausea vomiting. EXAM: CT ABDOMEN AND PELVIS WITH CONTRAST TECHNIQUE: Multidetector CT imaging of the abdomen and pelvis was performed using the standard protocol following bolus  administration of intravenous contrast. RADIATION DOSE REDUCTION: This exam was performed according to the departmental dose-optimization program which includes automated exposure control, adjustment of the mA and/or kV according to patient size and/or use of iterative reconstruction technique. CONTRAST:  75mL OMNIPAQUE IOHEXOL 350 MG/ML SOLN COMPARISON:  10/02/2023 and older exams. FINDINGS: Lower chest: No acute abnormality. Hepatobiliary: Liver normal in size and attenuation. Scattered calcifications consistent with healed granuloma, stable. No mass. Status post cholecystectomy. No bile duct dilation. Pancreas: No pancreatic mass or inflammation. Small medial duodenal diverticulum adjacent to the anterior elevator. This is stable. Spleen: Normal. Adrenals/Urinary Tract: No adrenal masses. Kidneys normal in size, orientation and position. Tiny intrarenal stone in the upper pole the left kidney. Small mid to lower pole right intrarenal stones. No hydronephrosis. Ureters are normal in course and in caliber. Bladder is unremarkable. Stomach/Bowel: Normal stomach. Small bowel and colon are normal in caliber. No wall thickening. No inflammation. Scattered left colon diverticula. Normal appendix. Vascular/Lymphatic: Aortic atherosclerosis. No aneurysm. No enlarged lymph nodes. Reproductive: Uterus and bilateral adnexa are unremarkable. Other: No abdominal wall hernia or abnormality. No abdominopelvic ascites. Musculoskeletal: No fracture or acute finding.  No bone lesion. IMPRESSION: 1. No acute findings within the abdomen or pelvis. No findings to account for the patient's symptoms. 2. Aortic atherosclerosis. Aortic Atherosclerosis (ICD10-I70.0). Electronically Signed   By: Amie Portland M.D.   On: 11/27/2023 12:27   DG Chest Port 1 View Result Date: 11/27/2023 CLINICAL DATA:  Hematemesis. EXAM: PORTABLE CHEST 1 VIEW COMPARISON:  09/02/2023 FINDINGS: Lungs are adequately inflated without focal airspace consolidation  or effusion. Cardiomediastinal silhouette and remainder of the exam is unchanged. IMPRESSION: No active disease. Electronically Signed   By: Elberta Fortis M.D.   On: 11/27/2023 08:51   Family Communication: Discussed with patient, she understand and agree. All questions answereed.  Disposition: Status is: Observation The patient remains OBS appropriate and will d/c before 2 midnights.  Planned Discharge Destination: Skilled nursing facility once able to tolerate diet.     Time spent: 37 minutes  Author: Marcelino Duster, MD 11/28/2023 2:12 PM Secure chat 7am to 7pm For on call review [email or web address].

## 2023-11-28 NOTE — [place] FL2 (Signed)
[place] MEDICAID FL2 LEVEL OF CARE FORM     IDENTIFICATION  Patient Name: Claudia Keller Birthdate: 04/19/1951 Sex: female Admission Date (Current Location): 11/27/2023  [place] and [place] Number:  [facility] Bast [id number] Facility and Address:  [facility], [address] [place], [place] [address]      Provider Number: [id number]  Attending Physician Name and Address:  Marcelino Duster, MD  Relative Name and Phone Number:  Cherrie Gauze [phone]    Current Level of Care: Hospital Recommended Level of Care: Skilled Nursing Facility Prior Approval Number:    Date Approved/Denied:   PASRR Number: [id number]  Discharge Plan: SNF    Current Diagnoses: Patient Active Problem List   Diagnosis Date Noted   Hematemesis 11/27/2023   History of gout 03/01/2022   Avitaminosis D 02/22/2022   Unspecified protein-calorie malnutrition (HCC) 11/12/2021   PAF (paroxysmal atrial fibrillation) (HCC) 11/09/2021   Pericardial effusion    (HFpEF) heart failure with preserved ejection fraction (HCC) 11/05/2021   History of pericarditis 11/05/2021   Dementia (HCC) 11/05/2021   CKD (chronic kidney disease) stage 3, GFR 30-59 ml/min (HCC) 11/05/2021   Prolonged QT interval 11/05/2021   B12 deficiency 09/04/2021   History of stroke 09/02/2021   Hypoalbuminemia 12/07/2020   Obesity (BMI 30-39.9) 12/07/2020   COPD (chronic obstructive pulmonary disease) (HCC) 12/07/2020   GERD (gastroesophageal reflux disease) 12/07/2020   Diabetic neuropathy (HCC) 12/07/2020   Anxiety 12/07/2020   Segmental colitis without complication (HCC)    Ischemic colitis (HCC) 03/19/2020   PTSD (post-traumatic stress disorder) 04/17/2018   Allergic rhinitis 11/06/2016   Bipolar 1 disorder, depressed (HCC) 11/05/2016   Dyslipidemia 11/05/2016   MDD (major depressive disorder), recurrent severe, without psychosis (HCC) 08/18/2016   Personal history of noncompliance with medical  treatment, presenting hazards to health 08/17/2016   Emesis 05/20/2016   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 (HCC) 09/08/2015   Essential hypertension, benign 09/08/2015   Primary hypothyroidism 09/08/2015   Diverticulosis of colon without hemorrhage    Tobacco abuse 06/24/2011   Irritable bowel syndrome 11/12/2009   History of colonic polyps 11/12/2009   Closed fracture of metatarsal bone 11/30/2007    Orientation RESPIRATION BLADDER Height & Weight     Self, Time, Situation, Place  Normal Incontinent Weight: 210 lb (95.3 kg) Height:  5\' 5"  (165.1 cm)  BEHAVIORAL SYMPTOMS/MOOD NEUROLOGICAL BOWEL NUTRITION STATUS      Incontinent Diet (see discharge summary)  AMBULATORY STATUS COMMUNICATION OF NEEDS Skin     Verbally Normal                       Personal Care Assistance Level of Assistance              Functional Limitations Info  Sight, Hearing, Speech Sight Info: Adequate Hearing Info: Impaired Speech Info: Adequate    SPECIAL CARE FACTORS FREQUENCY                       Contractures Contractures Info: Not present    Additional Factors Info  Code Status, Allergies, Insulin Sliding Scale Code Status Info: full Allergies Info: NKA   Insulin Sliding Scale Info: Novolog: see discharge summary       Current Medications (11/28/2023):  This is the current hospital active medication list Current Facility-Administered Medications  Medication Dose Route Frequency Provider Last Rate Last Admin   acetaminophen (TYLENOL) tablet 650 mg  650 mg  Oral Q6H PRN Synetta Fail, MD       Or   acetaminophen (TYLENOL) suppository 650 mg  650 mg Rectal Q6H PRN Synetta Fail, MD       allopurinol (ZYLOPRIM) tablet 50 mg  50 mg Oral Daily Synetta Fail, MD   50 mg at 11/28/23 1215   diltiazem (CARDIZEM CD) 24 hr capsule 180 mg  180 mg Oral Daily Synetta Fail, MD   180 mg at 11/28/23 1216   divalproex (DEPAKOTE ER) 24 hr tablet 1,000 mg   1,000 mg Oral QHS Synetta Fail, MD   1,000 mg at 11/27/23 2237   [START ON 11/29/2023] furosemide (LASIX) tablet 20 mg  20 mg Oral Berniece Salines, MD       insulin aspart (novoLOG) injection 0-15 Units  0-15 Units Subcutaneous Q4H Synetta Fail, MD   2 Units at 11/28/23 0415   levothyroxine (SYNTHROID) tablet 125 mcg  125 mcg Oral Q0600 Synetta Fail, MD   125 mcg at 11/28/23 0553   memantine (NAMENDA) tablet 10 mg  10 mg Oral BID Synetta Fail, MD   10 mg at 11/28/23 1216   mirtazapine (REMERON) tablet 15 mg  15 mg Oral QHS Synetta Fail, MD   15 mg at 11/27/23 2237   ondansetron (ZOFRAN) injection 4 mg  4 mg Intravenous Q8H PRN Synetta Fail, MD   4 mg at 11/27/23 1957   pantoprazole (PROTONIX) EC tablet 40 mg  40 mg Oral BID Marcelino Duster, MD       PARoxetine (PAXIL) tablet 40 mg  40 mg Oral Daily Synetta Fail, MD   40 mg at 11/28/23 1215   polyethylene glycol (MIRALAX / GLYCOLAX) packet 17 g  17 g Oral Daily Meredith Pel, NP   17 g at 11/28/23 1215   QUEtiapine (SEROQUEL XR) 24 hr tablet 200 mg  200 mg Oral QHS Synetta Fail, MD   200 mg at 11/27/23 2237   rosuvastatin (CRESTOR) tablet 40 mg  40 mg Oral Daily Synetta Fail, MD   40 mg at 11/28/23 1215   sodium chloride flush (NS) 0.9 % injection 3 mL  3 mL Intravenous Q12H Synetta Fail, MD   3 mL at 11/28/23 1217   sucralfate (CARAFATE) 1 GM/10ML suspension 1 g  1 g Oral TID WC & HS Marcelino Duster, MD         Discharge Medications: Please see discharge summary for a list of discharge medications.  Relevant Imaging Results:  Relevant Lab Results:   Additional Information SSN: [id number]  Lorri Frederick, LCSW

## 2023-11-28 NOTE — ED Notes (Signed)
ED TO INPATIENT HANDOFF REPORT  ED Nurse Name and Phone #:  Brayton Caves [phone]  S Name/Age/Gender Claudia Keller 73 y.o. female Room/Bed: 037C/037C  Code Status   Code Status: Full Code  Home/SNF/Other Skilled nursing facility Patient oriented to: self, place, time, and situation Is this baseline? Yes   Triage Complete: Triage complete  Chief Complaint Hematemesis [K92.0]  Triage Note Pt came to ED for hematemesis that started last night at 11pm. Nursing home gave ger zofran and it started again at 7am, pt has vomited 5 times since. C/O abd pain. Axox4. Hx of dementia.    Allergies No Known Allergies  Level of Care/Admitting Diagnosis ED Disposition     ED Disposition  Admit   Condition  --   Comment  Hospital Area: [facility] [[id number]]  Level of Care: Telemetry Medical [104]  May place patient in observation at [facility] or [facility] if equivalent level of care is available:: No  Covid Evaluation: Asymptomatic - no recent exposure (last 10 days) testing not required  Diagnosis: Hematemesis [578.0.ICD-9-CM]  Admitting Physician: Synetta Fail [[id number]]  Attending Physician: Synetta Fail [[id number]]          B Medical/Surgery History Past Medical History:  Diagnosis Date   Acute respiratory disease due to COVID-19 virus 12/07/2020   AMS (altered mental status) 11/05/2021   11/04/2021 - 11/10/2021 hospitalized initially with metabolic encephalopathy in the context of hypoxic respiratory failure with acute on chronic CHF.  Pericardiocentesis performed for pericarditis related bloody fluid.  Altered mental status attributed to UTI clinically.  11/04/2021 cath urine revealed multiple species.  Full course of Rocephin completed.     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   Hypothyroidism    IBS (irritable bowel syndrome)    Ketoacidosis    Metabolic encephalopathy 11/09/2021   Mixed hyperlipidemia    PTSD (post-traumatic stress disorder)    S/P colonoscopy 11/2009   RMR: pancolonic diverticula, polypoid rectal mucosa with  prominent lymphoid aggregates, repeat in Jan 2016    Stress incontinence, female    Tobacco use    Type 2 diabetes mellitus (HCC)    Urinary tract infection 11/05/2021   Past Surgical History:  Procedure Laterality Date   Bilateral tubal ligation     BREAST BIOPSY Left 10/16/2014   negative   CHOLECYSTECTOMY  09/11/2012   Procedure: LAPAROSCOPIC CHOLECYSTECTOMY;  Surgeon: Dalia Heading, MD;  Location: [facility] ORS;  Service: General;  Laterality: N/A;   COLONOSCOPY  11/17/2009   Dr. Jena Gauss: normal rectum, pancolonic diverticula, polyps benign, no adenomas.    COLONOSCOPY N/A 02/12/2015   Dr. Jena Gauss: colonic diverticulosis, multiple colonic polyps (tubular adenomas), negative segmental biopsies. Surveillance in 2021   ESOPHAGOGASTRODUODENOSCOPY  11/23/2011   Dr. Jena Gauss: Erosive reflux esophagitis; small hiatal hernia; esophagus dilated empirically   MALONEY DILATION  11/23/2011   Procedure: Elease Hashimoto DILATION;  Surgeon: Corbin Ade, MD;  Location: [facility] ENDO SUITE;  Service: Endoscopy;  Laterality: N/A;   PERICARDIOCENTESIS N/A 11/06/2021   Procedure: PERICARDIOCENTESIS;  Surgeon: Corky Crafts, MD;  Location: [facility] INVASIVE CV LAB;  Service: Cardiovascular;  Laterality: N/A;   SKIN LESION EXCISION     on buttocks   TUBAL LIGATION       A IV Location/Drains/Wounds Patient Lines/Drains/Airways Status     Active Line/Drains/Airways     Name Placement date Placement time Site Days   Peripheral IV 11/27/23 20 G Anterior;Right  Forearm 11/27/23  0800  Forearm  1   External Urinary Catheter 11/27/23  1959  --  1            Intake/Output Last 24 hours  Intake/Output Summary (Last 24 hours) at 11/28/2023 0140 Last data filed at 11/27/2023 1959 Gross per 24 hour  Intake --  Output 700 ml  Net -700 ml    Labs/Imaging Results for orders placed or performed during the hospital encounter of 11/27/23 (from the past 48 hours)  Comprehensive metabolic panel     Status: Abnormal   Collection Time: 11/27/23  8:00 AM  Result Value Ref Range   Sodium 141 135 - 145 mmol/L   Potassium 4.2 3.5 - 5.1 mmol/L   Chloride 98 98 - 111 mmol/L   CO2 29 22 - 32 mmol/L   Glucose, Bld 220 (H) 70 - 99 mg/dL    Comment: Glucose reference range applies only to samples taken after fasting for at least 8 hours.   BUN 17 8 - 23 mg/dL   Creatinine, Ser 1.61 0.44 - 1.00 mg/dL   Calcium 9.8 8.9 - 09.6 mg/dL   Total Protein 5.9 (L) 6.5 - 8.1 g/dL   Albumin 3.2 (L) 3.5 - 5.0 g/dL   AST 25 15 - 41 U/L   ALT 27 0 - 44 U/L   Alkaline Phosphatase 87 38 - 126 U/L   Total Bilirubin 0.6 0.0 - 1.2 mg/dL   GFR, Estimated >04 >54 mL/min    Comment: (NOTE) Calculated using the CKD-EPI Creatinine Equation (2021)    Anion gap 14 5 - 15    Comment: Performed at [facility], [address], [place], [place] [address]  CBC with Differential     Status: None   Collection Time: 11/27/23  8:00 AM  Result Value Ref Range   WBC 10.2 4.0 - 10.5 K/uL   RBC 4.02 3.87 - 5.11 MIL/uL   Hemoglobin 12.4 12.0 - 15.0 g/dL   HCT 91.4 78.2 - 95.6 %   MCV 94.8 80.0 - 100.0 fL   MCH 30.8 26.0 - 34.0 pg   MCHC 32.5 30.0 - 36.0 g/dL   RDW 21.3 08.6 - 57.8 %   Platelets 232 150 - 400 K/uL   nRBC 0.0 0.0 - 0.2 %   Neutrophils Relative % 76 %   Neutro Abs 7.6 1.7 - 7.7 K/uL   Lymphocytes Relative 18 %   Lymphs Abs 1.9 0.7 - 4.0 K/uL   Monocytes Relative 5 %   Monocytes Absolute 0.6 0.1 - 1.0 K/uL   Eosinophils Relative 1 %   Eosinophils Absolute 0.1  0.0 - 0.5 K/uL   Basophils Relative 0 %   Basophils Absolute 0.0 0.0 - 0.1 K/uL   Immature Granulocytes 0 %   Abs Immature Granulocytes 0.03 0.00 - 0.07 K/uL    Comment: Performed at [facility], [address], [place], [place] [address]  Protime-INR     Status: None   Collection Time: 11/27/23  8:00 AM  Result Value Ref Range   Prothrombin  Time 13.5 11.4 - 15.2 seconds   INR 1.0 0.8 - 1.2    Comment: (NOTE) INR goal varies based on device and disease states. Performed at [facility], [address] [place], [place] [address]   Type and screen [facility]     Status: None   Collection Time: 11/27/23  8:00 AM  Result Value Ref Range   ABO/RH(D) O POS    Antibody Screen NEG    Sample Expiration      11/30/2023,2359 Performed at [facility], [address] [place], [place] [address]   APTT     Status: None   Collection Time: 11/27/23  8:00 AM  Result Value Ref Range   aPTT 28 24 - 36 seconds    Comment: Performed at [facility], [address] [place], [place] [address]  I-Stat Chem 8, ED     Status: Abnormal   Collection Time: 11/27/23  8:32 AM  Result Value Ref Range   Sodium 142 135 - 145 mmol/L   Potassium 4.1 3.5 - 5.1 mmol/L   Chloride 101 98 - 111 mmol/L   BUN 19 8 - 23 mg/dL   Creatinine, Ser 4.16 0.44 - 1.00 mg/dL   Glucose, Bld 606 (H) 70 - 99 mg/dL    Comment: Glucose reference range applies only to samples taken after fasting for at least 8 hours.   Calcium, Ion 1.18 1.15 - 1.40 mmol/L   TCO2 37 (H) 22 - 32 mmol/L   Hemoglobin 12.6 12.0 - 15.0 g/dL   HCT 30.1 60.1 - 09.3 %  I-Stat Lactic Acid, ED     Status: Abnormal   Collection Time: 11/27/23  8:33 AM  Result Value Ref Range   Lactic Acid, Venous 2.1 (HH) 0.5 - 1.9 mmol/L   Comment NOTIFIED PHYSICIAN   Urinalysis, w/ Reflex to Culture (Infection Suspected) -Urine, Catheterized     Status: Abnormal   Collection Time: 11/27/23 12:05 PM  Result Value Ref Range    Specimen Source URINE, CLEAN CATCH    Color, Urine YELLOW YELLOW   APPearance CLEAR CLEAR   Specific Gravity, Urine 1.021 1.005 - 1.030   pH 8.0 5.0 - 8.0   Glucose, UA NEGATIVE NEGATIVE mg/dL   Hgb urine dipstick NEGATIVE NEGATIVE   Bilirubin Urine NEGATIVE NEGATIVE   Ketones, ur NEGATIVE NEGATIVE mg/dL   Protein, ur 30 (A) NEGATIVE mg/dL   Nitrite NEGATIVE NEGATIVE   Leukocytes,Ua NEGATIVE NEGATIVE   RBC / HPF 0-5 0 - 5 RBC/hpf   WBC, UA 0-5 0 - 5 WBC/hpf    Comment:        Reflex urine culture not performed if WBC <=10, OR if Squamous epithelial cells >5. If Squamous epithelial cells >5 suggest recollection.    Bacteria, UA FEW (A) NONE SEEN   Squamous Epithelial / HPF 0-5 0 - 5 /HPF    Comment: Performed at [facility], [address], [place], [place] [address]  CBC     Status: Abnormal   Collection Time: 11/27/23  5:55 PM  Result Value Ref Range   WBC 13.2 (H) 4.0 - 10.5 K/uL   RBC 3.34 (L) 3.87 - 5.11 MIL/uL   Hemoglobin 10.4 (L) 12.0 - 15.0 g/dL   HCT 32.2 (L) 02.5 - 42.7 %   MCV 100.6 (H) 80.0 - 100.0 fL   MCH 31.1 26.0 - 34.0 pg   MCHC 31.0 30.0 - 36.0 g/dL  RDW 14.7 11.5 - 15.5 %   Platelets 180 150 - 400 K/uL   nRBC 0.0 0.0 - 0.2 %    Comment: Performed at [facility], [address], [place], [place] [address]  Hemoglobin A1c     Status: Abnormal   Collection Time: 11/27/23  5:55 PM  Result Value Ref Range   Hgb A1c MFr Bld 6.8 (H) 4.8 - 5.6 %    Comment: (NOTE) Pre diabetes:          5.7%-6.4%  Diabetes:              >6.4%  Glycemic control for   <7.0% adults with diabetes    Mean Plasma Glucose 148.46 mg/dL    Comment: Performed at [facility], [address] [place], [place] [address]  CBG monitoring, ED     Status: Abnormal   Collection Time: 11/27/23  6:08 PM  Result Value Ref Range   Glucose-Capillary 133 (H) 70 - 99 mg/dL    Comment: Glucose reference range applies only to samples taken after fasting for at least 8  hours.  CBG monitoring, ED     Status: Abnormal   Collection Time: 11/27/23  7:37 PM  Result Value Ref Range   Glucose-Capillary 137 (H) 70 - 99 mg/dL    Comment: Glucose reference range applies only to samples taken after fasting for at least 8 hours.  CBG monitoring, ED     Status: Abnormal   Collection Time: 11/28/23 12:12 AM  Result Value Ref Range   Glucose-Capillary 117 (H) 70 - 99 mg/dL    Comment: Glucose reference range applies only to samples taken after fasting for at least 8 hours.   CT ABDOMEN PELVIS W CONTRAST Result Date: 11/27/2023 CLINICAL DATA:  Abdominal pain.  Nausea vomiting. EXAM: CT ABDOMEN AND PELVIS WITH CONTRAST TECHNIQUE: Multidetector CT imaging of the abdomen and pelvis was performed using the standard protocol following bolus administration of intravenous contrast. RADIATION DOSE REDUCTION: This exam was performed according to the departmental dose-optimization program which includes automated exposure control, adjustment of the mA and/or kV according to patient size and/or use of iterative reconstruction technique. CONTRAST:  75mL OMNIPAQUE IOHEXOL 350 MG/ML SOLN COMPARISON:  10/02/2023 and older exams. FINDINGS: Lower chest: No acute abnormality. Hepatobiliary: Liver normal in size and attenuation. Scattered calcifications consistent with healed granuloma, stable. No mass. Status post cholecystectomy. No bile duct dilation. Pancreas: No pancreatic mass or inflammation. Small medial duodenal diverticulum adjacent to the anterior elevator. This is stable. Spleen: Normal. Adrenals/Urinary Tract: No adrenal masses. Kidneys normal in size, orientation and position. Tiny intrarenal stone in the upper pole the left kidney. Small mid to lower pole right intrarenal stones. No hydronephrosis. Ureters are normal in course and in caliber. Bladder is unremarkable. Stomach/Bowel: Normal stomach. Small bowel and colon are normal in caliber. No wall thickening. No inflammation. Scattered  left colon diverticula. Normal appendix. Vascular/Lymphatic: Aortic atherosclerosis. No aneurysm. No enlarged lymph nodes. Reproductive: Uterus and bilateral adnexa are unremarkable. Other: No abdominal wall hernia or abnormality. No abdominopelvic ascites. Musculoskeletal: No fracture or acute finding.  No bone lesion. IMPRESSION: 1. No acute findings within the abdomen or pelvis. No findings to account for the patient's symptoms. 2. Aortic atherosclerosis. Aortic Atherosclerosis (ICD10-I70.0). Electronically Signed   By: Amie Portland M.D.   On: 11/27/2023 12:27   DG Chest Port 1 View Result Date: 11/27/2023 CLINICAL DATA:  Hematemesis. EXAM: PORTABLE CHEST 1 VIEW COMPARISON:  09/02/2023 FINDINGS:  Lungs are adequately inflated without focal airspace consolidation or effusion. Cardiomediastinal silhouette and remainder of the exam is unchanged. IMPRESSION: No active disease. Electronically Signed   By: Elberta Fortis M.D.   On: 11/27/2023 08:51    Pending Labs Unresulted Labs (From admission, onward)     Start     Ordered   11/28/23 0500  Comprehensive metabolic panel  Tomorrow morning,   R        11/27/23 1559   11/27/23 1800  CBC  Now then every 12 hours,   R      11/27/23 1559   11/27/23 1541  Occult blood card to lab, stool  Once,   URGENT        11/27/23 1540   11/27/23 0802  Occult bld gastric/duodenum (cup to lab)  Once,   URGENT        11/27/23 0801            Vitals/Pain Today's Vitals   11/27/23 2300 11/27/23 2344 11/28/23 0000 11/28/23 0100  BP: (!) 121/53  (!) 113/48 (!) 105/52  Pulse: (!) 103  (!) 103 94  Resp: 18  19 14   Temp:  98.9 F (37.2 C)    TempSrc:  Oral    SpO2: 97%  94% 96%  Weight:      Height:      PainSc:        Isolation Precautions No active isolations  Medications Medications  allopurinol (ZYLOPRIM) tablet 50 mg (has no administration in time range)  diltiazem (CARDIZEM CD) 24 hr capsule 180 mg (has no administration in time range)   rosuvastatin (CRESTOR) tablet 40 mg (has no administration in time range)  PARoxetine (PAXIL) tablet 40 mg (has no administration in time range)  memantine (NAMENDA) tablet 10 mg (10 mg Oral Given 11/27/23 2237)  mirtazapine (REMERON) tablet 15 mg (15 mg Oral Given 11/27/23 2237)  QUEtiapine (SEROQUEL XR) 24 hr tablet 200 mg (200 mg Oral Given 11/27/23 2237)  levothyroxine (SYNTHROID) tablet 125 mcg (has no administration in time range)  divalproex (DEPAKOTE ER) 24 hr tablet 1,000 mg (1,000 mg Oral Given 11/27/23 2237)  sodium chloride flush (NS) 0.9 % injection 3 mL (3 mLs Intravenous Given 11/27/23 2238)  acetaminophen (TYLENOL) tablet 650 mg (has no administration in time range)    Or  acetaminophen (TYLENOL) suppository 650 mg (has no administration in time range)  pantoprazole (PROTONIX) injection 40 mg (40 mg Intravenous Given 11/27/23 2237)  polyethylene glycol (MIRALAX / GLYCOLAX) packet 17 g (17 g Oral Given 11/27/23 1746)  insulin aspart (novoLOG) injection 0-15 Units ( Subcutaneous Not Given 11/28/23 0015)  ondansetron (ZOFRAN) injection 4 mg (4 mg Intravenous Given 11/27/23 1957)  ondansetron (ZOFRAN) injection 4 mg (4 mg Intravenous Given 11/27/23 0813)  pantoprazole (PROTONIX) injection 40 mg (40 mg Intravenous Given 11/27/23 0843)  lactated ringers bolus 1,000 mL (0 mLs Intravenous Stopped 11/27/23 0954)  iohexol (OMNIPAQUE) 350 MG/ML injection 75 mL (75 mLs Intravenous Contrast Given 11/27/23 1145)  lactated ringers bolus 1,000 mL (0 mLs Intravenous Stopped 11/27/23 1556)    Mobility non-ambulatory     Focused Assessments GI assessment  R Recommendations: See Admitting Provider Note  Report given to:   Additional Notes: NA

## 2023-11-28 NOTE — Anesthesia Procedure Notes (Addendum)
Procedure Name: Intubation Date/Time: 11/28/2023 10:57 AM  Performed by: Bartholomew Crews, CRNAPre-anesthesia Checklist: Patient identified, Emergency Drugs available, Suction available, Patient being monitored and Timeout performed Patient Re-evaluated:Patient Re-evaluated prior to induction Oxygen Delivery Method: Circle system utilized Preoxygenation: Pre-oxygenation with 100% oxygen Induction Type: IV induction Ventilation: Mask ventilation without difficulty Laryngoscope Size: 3 and Mac Grade View: Grade I Tube type: Oral Tube size: 6.5 mm Number of attempts: 1 Airway Equipment and Method: Stylet Placement Confirmation: ETT inserted through vocal cords under direct vision, positive ETCO2 and breath sounds checked- equal and bilateral Secured at: 22 cm Tube secured with: Tape Dental Injury: Teeth and Oropharynx as per pre-operative assessment

## 2023-11-28 NOTE — Transfer of Care (Signed)
Immediate Anesthesia Transfer of Care Note  Patient: Claudia Keller  Procedure(s) Performed: ESOPHAGOGASTRODUODENOSCOPY (EGD) WITH PROPOFOL BIOPSY  Patient Location: PACU and Endoscopy Unit  Anesthesia Type:general   Level of Consciousness: awake  Airway & Oxygen Therapy: Patient Spontanous Breathing  Post-op Assessment: Report given to RN  Post vital signs: Reviewed and stable  Last Vitals:  Vitals Value Taken Time  BP 150/58 11/28/23 1123  Temp    Pulse 90 11/28/23 1125  Resp 18 11/28/23 1125  SpO2 94 % 11/28/23 1125  Vitals shown include unfiled device data.  Last Pain:  Vitals:   11/28/23 1018  TempSrc: Temporal  PainSc: 0-No pain         Complications: No notable events documented.

## 2023-11-28 NOTE — Evaluation (Signed)
Occupational Therapy Evaluation/Discharge Patient Details Name: Claudia Keller MRN: [id number] DOB: 08-Apr-1951 Today's Date: 11/28/2023   History of Present Illness 73 y.o. female with medical history significant of dementia, hypertension, hypothyroidism, hyperlipidemia, GERD, stroke, atrial fibrillation, CKD 3A, diabetes, pericardial effusion, CHF, gout, depression, anxiety, bipolar disorder, PTSD, COPD, obesity, ischemic colitis, IBS, QT prolongation presenting with hematemesis. Episode of nausea vomiting with hematemesis noted yesterday evening around 11 PM and nursing facility. Pt underwent EGD this morning which showed erosive esophagitis, 2 cm hiatal hernia.   Clinical Impression   Patient evaluated by Occupational Therapy with no further acute OT needs identified. All education has been completed and the patient has no further questions. Pt is resident at [facility] and plan is to return at discharge. Pt reports that she is WC bound and has walked since her CVA. Hoyer lift is used for transfers with staff at [facility]. Staff assists with BADL tasks. OT is signing off. Thank you for this referral.        If plan is discharge home, recommend the following: A lot of help with walking and/or transfers;A lot of help with bathing/dressing/bathroom    Functional Status Assessment  Patient has not had a recent decline in their functional status  Equipment Recommendations  None recommended by OT       Precautions / Restrictions Precautions Precautions: Fall Restrictions Weight Bearing Restrictions Per Provider Order: No      Mobility Bed Mobility Overal bed mobility: Needs Assistance Bed Mobility: Rolling, Supine to Sit, Sit to Supine Rolling: Min assist, Used rails   Supine to sit: Max assist, HOB elevated, Used rails Sit to supine: Max assist   General bed mobility comments: Bed pad used to assist with transitioning from supine to sit with HOB elevated all the way. Total A with use of bed  pad to scoot hips towards EOB.    Transfers    General transfer comment: deferred d/t to poor sitting balance. Pt reports using hoyer lift at SNF with staff.      Balance Overall balance assessment: Needs assistance Sitting-balance support: Single extremity supported, Feet supported Sitting balance-Leahy Scale: Fair Sitting balance - Comments: Able to maintain static sitting balance briefly while seated EOB without challenge. Unable to maintain sitting balance when any challenge is introduced. Postural control: Right lateral lean     Standing balance comment: N/T        ADL either performed or assessed with clinical judgement   ADL         General ADL Comments: Pt requires max-total assist for BADL tasks at bed level.     Vision Baseline Vision/History: 1 Wears glasses Ability to See in Adequate Light: 0 Adequate Patient Visual Report: No change from baseline Vision Assessment?: No apparent visual deficits     Perception Perception: Not tested       Praxis Praxis: Not tested       Pertinent Vitals/Pain Pain Assessment Pain Assessment: Faces Faces Pain Scale: No hurt     Extremity/Trunk Assessment Upper Extremity Assessment Upper Extremity Assessment: Right hand dominant;LUE deficits/detail LUE Deficits / Details: history of CVA with left UE residual weakness and limited ROM at baseline   Lower Extremity Assessment Lower Extremity Assessment: Generalized weakness   Cervical / Trunk Assessment Cervical / Trunk Assessment: Kyphotic      Cognition Arousal: Alert Behavior During Therapy: WFL for tasks assessed/performed Overall Cognitive Status: History of cognitive impairments - at baseline  General Comments: followed all commands appropriately  General Comments  VSS on RA            Home Living Family/patient expects to be discharged to:: Skilled nursing facility   Additional Comments: Pt is a resident at [facility].      Prior  Functioning/Environment Prior Level of Function : Needs assist;Patient poor historian/Family not available       Physical Assist : Mobility (physical);ADLs (physical)     Mobility Comments: Pt reports staff uses hoyer lift for transfers. Non-ambulatory and uses WC at baseline. Pt states she wears a boot (?) on her left leg. Nothing mentioned in chart. ADLs Comments: Staff provides assistance for all BADL tasks.        OT Problem List: Decreased strength         OT Goals(Current goals can be found in the care plan section) Acute Rehab OT Goals Patient Stated Goal: none stated OT Goal Formulation: All assessment and education complete, DC therapy  OT Frequency:  1X visit       AM-PAC OT "6 Clicks" Daily Activity     Outcome Measure Help from another person eating meals?: A Little Help from another person taking care of personal grooming?: A Lot Help from another person toileting, which includes using toliet, bedpan, or urinal?: Total Help from another person bathing (including washing, rinsing, drying)?: Total Help from another person to put on and taking off regular upper body clothing?: A Lot Help from another person to put on and taking off regular lower body clothing?: Total 6 Click Score: 10   End of Session Nurse Communication: Mobility status;Other (comment) (need for new purewick)  Activity Tolerance: Patient tolerated treatment well Patient left: in chair;with call bell/phone within reach;with bed alarm set  OT Visit Diagnosis: Muscle weakness (generalized) (M62.81)                Time: 3664-4034 OT Time Calculation (min): 39 min Charges:  OT General Charges $OT Visit: 1 Visit OT Evaluation $OT Eval High Complexity: 1 High OT Treatments $Self Care/Home Management : 23-37 mins  Limmie Patricia, OTR/L,CBIS  Supplemental OT - [facility] and [facility] Secure Chat Preferred    Emaley Applin, Charisse March 11/28/2023, 4:12 PM

## 2023-11-28 NOTE — Interval H&P Note (Signed)
History and Physical Interval Note:  11/28/2023 10:39 AM  Claudia Keller  has presented today for surgery, with the diagnosis of hematemesis.  The various methods of treatment have been discussed with the patient and family. After consideration of risks, benefits and other options for treatment, the patient has consented to  Procedure(s): ESOPHAGOGASTRODUODENOSCOPY (EGD) WITH PROPOFOL (N/A) as a surgical intervention.  The patient's history has been reviewed, patient examined, no change in status, stable for surgery.  I have reviewed the patient's chart and labs.  Questions were answered to the patient's satisfaction.     Lynann Bologna

## 2023-11-28 NOTE — Interval H&P Note (Signed)
History and Physical Interval Note:  11/28/2023 10:38 AM  Claudia Keller  has presented today for surgery, with the diagnosis of hematemesis.  The various methods of treatment have been discussed with the patient and family. After consideration of risks, benefits and other options for treatment, the patient has consented to  Procedure(s): ESOPHAGOGASTRODUODENOSCOPY (EGD) WITH PROPOFOL (N/A) as a surgical intervention.  The patient's history has been reviewed, patient examined, no change in status, stable for surgery.  I have reviewed the patient's chart and labs.  Questions were answered to the patient's satisfaction.     Lynann Bologna

## 2023-11-29 DIAGNOSIS — K2210 Ulcer of esophagus without bleeding: Secondary | ICD-10-CM

## 2023-11-29 DIAGNOSIS — N1831 Chronic kidney disease, stage 3a: Secondary | ICD-10-CM | POA: Diagnosis not present

## 2023-11-29 DIAGNOSIS — K920 Hematemesis: Secondary | ICD-10-CM | POA: Diagnosis not present

## 2023-11-29 DIAGNOSIS — K922 Gastrointestinal hemorrhage, unspecified: Secondary | ICD-10-CM | POA: Diagnosis not present

## 2023-11-29 DIAGNOSIS — I480 Paroxysmal atrial fibrillation: Secondary | ICD-10-CM | POA: Diagnosis not present

## 2023-11-29 LAB — GLUCOSE, CAPILLARY
Glucose-Capillary: 134 mg/dL — ABNORMAL HIGH (ref 70–99)
Glucose-Capillary: 135 mg/dL — ABNORMAL HIGH (ref 70–99)
Glucose-Capillary: 136 mg/dL — ABNORMAL HIGH (ref 70–99)
Glucose-Capillary: 179 mg/dL — ABNORMAL HIGH (ref 70–99)

## 2023-11-29 LAB — HEMOGLOBIN AND HEMATOCRIT, BLOOD
HCT: 33 % — ABNORMAL LOW (ref 36.0–46.0)
Hemoglobin: 10.7 g/dL — ABNORMAL LOW (ref 12.0–15.0)

## 2023-11-29 MED ORDER — PANTOPRAZOLE SODIUM 40 MG PO TBEC: 40.0000 mg | DELAYED_RELEASE_TABLET | Freq: Two times a day (BID) | ORAL | 2 refills | Status: DC

## 2023-11-29 MED ORDER — SUCRALFATE 1 GM/10ML PO SUSP: 1.0000 g | Freq: Four times a day (QID) | ORAL | 1 refills | Status: DC

## 2023-11-29 NOTE — TOC Transition Note (Addendum)
Transition of Care [facility]) - Discharge Note   Patient Details  Name: Claudia Keller MRN: [id number] Date of Birth: 1951/07/03  Transition of Care [facility]) CM/SW Contact:  Lorri Frederick, LCSW Phone Number: 11/29/2023, 11:59 AM   Clinical Narrative:   Pt discharging to [facility], room 202A.  RN call report to [phone].  Pt will transport via [employer] wheelchair Zenaida Niece and will need to be brought down to main [employer] entrance.  1000: CSW confirmed with [facility] that they can receive pt today.   1230: Per RN, pt requiring lift, not safe for wheelchair transport, [facility] transport set up.         Final next level of care: Skilled Nursing Facility Barriers to Discharge: Barriers Resolved   Patient Goals and CMS Choice     Choice offered to / list presented to : Patient, Adult Children (son Bethann Berkshire)      Discharge Placement              Patient chooses bed at:  [facility]) Patient to be transferred to facility by: safe transport Name of family member notified: son Bethann Berkshire Patient and family notified of of transfer: 11/29/23  Discharge Plan and Services Additional resources added to the After Visit Summary for   In-house Referral: Clinical Social Work   Post Acute Care Choice: Skilled Nursing Facility                               Social Drivers of Health (SDOH) Interventions SDOH Screenings   Food Insecurity: No Food Insecurity (11/28/2023)  Housing: Unknown (11/28/2023)  Transportation Needs: No Transportation Needs (11/28/2023)  Utilities: Not At Risk (11/28/2023)  Depression (PHQ2-9): Low Risk  (12/31/2021)  Social Connections: Unknown (11/28/2023)  Tobacco Use: High Risk (11/28/2023)     Readmission Risk Interventions     No data to display

## 2023-11-29 NOTE — Discharge Planning (Signed)
Patient alert. IV access removed. Discharge teaching given the [place] fund[facility] at [facility] facility. Discharge summary placed in discharge packet. Patient will be transported via [facility].

## 2023-11-29 NOTE — Discharge Summary (Addendum)
Physician Discharge Summary   Patient: Claudia Keller MRN: [id number] DOB: 04-Jun-1951  Admit date:     11/27/2023  Discharge date: 11/29/23  Discharge Physician: Marcelino Duster   PCP: Marinda Elk, MD   Recommendations at discharge:    PCP follow up in 1 week. Follow up with [facility] for persistent symptoms.  Discharge Diagnoses: Principal Problem:   Hematemesis Active Problems:   PAF (paroxysmal atrial fibrillation) (HCC)   Irritable bowel syndrome   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 (HCC)   Essential hypertension, benign   Primary hypothyroidism   PTSD (post-traumatic stress disorder)   Obesity (BMI 30-39.9)   COPD (chronic obstructive pulmonary disease) (HCC)   Erosive gastroesophageal reflux disease   Anxiety   History of stroke   (HFpEF) heart failure with preserved ejection fraction (HCC)   Dementia (HCC)   CKD (chronic kidney disease) stage 3, GFR 30-59 ml/min (HCC)   Prolonged QT interval   Bipolar 1 disorder, depressed (HCC)   Dyslipidemia   MDD (major depressive disorder), recurrent severe, without psychosis (HCC)   History of gout  Resolved Problems:   * No resolved hospital problems. *  Hospital Course: EARMA YIELDING is a 73 y.o. female with medical history significant of dementia, hypertension, hypothyroidism, hyperlipidemia, GERD, stroke, atrial fibrillation, CKD 3A, diabetes, pericardial effusion, CHF, gout, depression, anxiety, bipolar disorder, PTSD, COPD, obesity, ischemic colitis, IBS, QT prolongation presenting with hematemesis.    Assessment and Plan: Hematemesis Erosive reflux esophagitis Recurrent nausea, hematemesis with drop in hemoglobin. Hemoglobin remained stable. GI evaluation is appreciated.   S/p EGD, which showed erosive esophagitis, 2 cm hiatal hernia. No active bleeding.  GI advised PPIs BID x 8 weeks, then maintenance every day and short-term Carafate with [facility] follow-up outpatient if symptoms  persist. H/H stable. She is able to tolerate diet well. Nausea nd abdominal pain improved.   Hypertension Continue Lasix which she takes every other day Continue home diltiazem   Hypothyroidism Home dose Synthroid resumed.   Hyperlipidemia Continue rosuvastatin    History of CVA Resumed ASA Continue home rosuvastatin   Atrial fibrillation Continue home diltiazem Not currently on anticoagulation   Chronic diastolic CHF History of pericardial effusion and pericarditis Last echo was in 2023 with EF 60-65%, normal RV function, no comment on diastolic function.  No pericardial effusion at that time.  Limited echo. Takes Lasix every other day.   CKD 3A Creatinine stable in the ED. Trend renal function and electrolytes   Diabetes Continue 10 units twice daily at facility with SSI. Hypoglycemia protocol   Depression Anxiety Bipolar PTSD Dementia Continue home Namenda, paroxetine, mirtazapine, Seroquel   COPD Bronchodilators as needed   Gout Continue home allopurinol   Obesity BMI 34.95. Diet, exercise and weight reduction advised.          Consultants: GI Procedures performed: EGD  Disposition: Skilled nursing facility Diet recommendation:  Discharge Diet Orders (From admission, onward)     Start     Ordered   11/29/23 0000  Diet - low sodium heart healthy        11/29/23 1111   11/29/23 0000  Diet Carb Modified        11/29/23 1111           Cardiac and Carb modified diet DISCHARGE MEDICATION: Allergies as of 11/29/2023   No Known Allergies      Medication List     TAKE these medications    acetaminophen 325  MG tablet Commonly known as: TYLENOL Take 650 mg by mouth every 8 (eight) hours as needed for mild pain or moderate pain.   albuterol 108 (90 Base) MCG/ACT inhaler Commonly known as: VENTOLIN HFA Inhale 2 puffs into the lungs every 4 (four) hours as needed for wheezing or shortness of breath.   allopurinol 100 MG  tablet Commonly known as: ZYLOPRIM Take 50 mg by mouth daily. Idiopathic gout   aspirin 81 MG chewable tablet Chew 81 mg by mouth daily.   bisacodyl 10 MG suppository Commonly known as: DULCOLAX Place 10 mg rectally daily as needed (constipation not relieved by MOM).   carbamide peroxide 6.5 % OTIC solution Commonly known as: DEBROX Place 3 drops into both ears once a week. Every Monday   colchicine 0.6 MG tablet Take 1 tablet (0.6 mg total) by mouth daily.   diltiazem 180 MG 24 hr capsule Commonly known as: DILACOR XR Take 180 mg by mouth daily.   divalproex 500 MG 24 hr tablet Commonly known as: DEPAKOTE ER Take 2 tablets (1,000 mg total) by mouth at bedtime.   furosemide 20 MG tablet Commonly known as: LASIX Take 1 tablet (20 mg total) by mouth every other day.   insulin aspart 100 UNIT/ML injection Commonly known as: novoLOG Inject 0-10 Units into the skin in the morning, at noon, in the evening, and at bedtime. BS 0-150 inject 0 units  BS 151-200 inject 2 units  BS 201-250 inject 3 units  BS 251-300 inject 5 units  BS 301-350 inject 7 units  BS 351- 400 inject 10 units  BS 401-999 call MD   Insulin Glargine Solostar 100 UNIT/ML Solostar Pen Commonly known as: LANTUS Inject 10 Units into the skin in the morning and at bedtime.   ipratropium-albuterol 0.5-2.5 (3) MG/3ML Soln Commonly known as: DUONEB Take 3 mLs by nebulization every 4 (four) hours as needed (SOB/wheezing).   levothyroxine 125 MCG tablet Commonly known as: SYNTHROID Take 125 mcg by mouth daily before breakfast.   magnesium hydroxide 400 MG/5ML suspension Commonly known as: MILK OF MAGNESIA Take 30 mLs by mouth daily as needed (if no BM in 3 days).   memantine 10 MG tablet Commonly known as: NAMENDA Take 10 mg by mouth 2 (two) times daily.   mirtazapine 15 MG tablet Commonly known as: REMERON Take 15 mg by mouth at bedtime.   NUTRITIONAL SUPPLEMENT PO Take 1 each by mouth daily. Magic  Cup   ondansetron 4 MG tablet Commonly known as: ZOFRAN Take 1 tablet (4 mg total) by mouth every 6 (six) hours as needed for nausea or vomiting.   Oyster Shell Calcium w/D 500-200 MG-UNIT Tabs Take 1 tablet by mouth 2 (two) times daily.   pantoprazole 40 MG tablet Commonly known as: PROTONIX Take 1 tablet (40 mg total) by mouth 2 (two) times daily. Twice daily for 8 weeks then once daily What changed:  how much to take how to take this when to take this additional instructions   PARoxetine 40 MG tablet Commonly known as: PAXIL Take 1 tablet (40 mg total) by mouth daily.   polyethylene glycol powder 17 GM/SCOOP powder Commonly known as: GLYCOLAX/MIRALAX Take 17 g by mouth daily.   promethazine 12.5 MG suppository Commonly known as: PHENERGAN Place 12.5 mg rectally every 6 (six) hours as needed for nausea or vomiting.   QUEtiapine 200 MG 24 hr tablet Commonly known as: SEROQUEL XR Take 200 mg by mouth at bedtime.   RA SALINE ENEMA  RE Place 1 Dose rectally once as needed (severe constipation). If no relief from both milk of magnesia, and Bisacodyl suppository, administer one dose of disposable saline enema.   rosuvastatin 40 MG tablet Commonly known as: CRESTOR Take 1 tablet (40 mg total) by mouth daily.   saccharomyces boulardii 250 MG capsule Commonly known as: FLORASTOR Take 250 mg by mouth daily.   senna-docusate 8.6-50 MG tablet Commonly known as: Senokot-S Take 1 tablet by mouth at bedtime as needed for mild constipation.   sucralfate 1 GM/10ML suspension Commonly known as: Carafate Take 10 mLs (1 g total) by mouth 4 (four) times daily.   Trulicity 1.5 MG/0.5ML Soaj Generic drug: Dulaglutide Inject 1.5 mg into the skin once a week. Every Wednesday   vitamin B-12 500 MCG tablet Commonly known as: CYANOCOBALAMIN Take 500 mcg by mouth daily.        Follow-up Information     Demarchi, Dionne Ano, MD Follow up in 1 week(s).   Specialty: Family  Medicine Contact information: [phone] [address] [place] [address] [phone]                Discharge Exam: Ceasar Mons Weights   11/27/23 0750  Weight: 95.3 kg      11/29/2023    9:00 AM 11/29/2023    7:29 AM 11/29/2023    5:05 AM  Vitals with BMI  Systolic 124 124 865  Diastolic 45 53 51  Pulse 72 66 73    General - Elderly ill Caucasian female, no apparent distress HEENT - PERRLA, EOMI, atraumatic head, non tender sinuses. Lung - Clear, basal crackles, no rhonchi, wheezes. Heart - S1, S2 heard, no murmurs, rubs, trace pedal edema. Abdomen - Soft, non tender, non distended, bowel sounds good Neuro - Alert, awake and oriented x 3, non focal exam. Skin - Warm and dry.  Condition at discharge: stable  The results of significant diagnostics from this hospitalization (including imaging, microbiology, ancillary and laboratory) are listed below for reference.   Imaging Studies: CT ABDOMEN PELVIS W CONTRAST Result Date: 11/27/2023 CLINICAL DATA:  Abdominal pain.  Nausea vomiting. EXAM: CT ABDOMEN AND PELVIS WITH CONTRAST TECHNIQUE: Multidetector CT imaging of the abdomen and pelvis was performed using the standard protocol following bolus administration of intravenous contrast. RADIATION DOSE REDUCTION: This exam was performed according to the departmental dose-optimization program which includes automated exposure control, adjustment of the mA and/or kV according to patient size and/or use of iterative reconstruction technique. CONTRAST:  75mL OMNIPAQUE IOHEXOL 350 MG/ML SOLN COMPARISON:  10/02/2023 and older exams. FINDINGS: Lower chest: No acute abnormality. Hepatobiliary: Liver normal in size and attenuation. Scattered calcifications consistent with healed granuloma, stable. No mass. Status post cholecystectomy. No bile duct dilation. Pancreas: No pancreatic mass or inflammation. Small medial duodenal diverticulum adjacent to the anterior elevator. This is stable.  Spleen: Normal. Adrenals/Urinary Tract: No adrenal masses. Kidneys normal in size, orientation and position. Tiny intrarenal stone in the upper pole the left kidney. Small mid to lower pole right intrarenal stones. No hydronephrosis. Ureters are normal in course and in caliber. Bladder is unremarkable. Stomach/Bowel: Normal stomach. Small bowel and colon are normal in caliber. No wall thickening. No inflammation. Scattered left colon diverticula. Normal appendix. Vascular/Lymphatic: Aortic atherosclerosis. No aneurysm. No enlarged lymph nodes. Reproductive: Uterus and bilateral adnexa are unremarkable. Other: No abdominal wall hernia or abnormality. No abdominopelvic ascites. Musculoskeletal: No fracture or acute finding.  No bone lesion. IMPRESSION: 1. No acute findings within the abdomen  or pelvis. No findings to account for the patient's symptoms. 2. Aortic atherosclerosis. Aortic Atherosclerosis (ICD10-I70.0). Electronically Signed   By: Amie Portland M.D.   On: 11/27/2023 12:27   DG Chest Port 1 View Result Date: 11/27/2023 CLINICAL DATA:  Hematemesis. EXAM: PORTABLE CHEST 1 VIEW COMPARISON:  09/02/2023 FINDINGS: Lungs are adequately inflated without focal airspace consolidation or effusion. Cardiomediastinal silhouette and remainder of the exam is unchanged. IMPRESSION: No active disease. Electronically Signed   By: Elberta Fortis M.D.   On: 11/27/2023 08:51    Microbiology: Results for orders placed or performed during the hospital encounter of 10/02/23  Urine Culture     Status: Abnormal   Collection Time: 10/02/23 11:56 AM   Specimen: Urine, Clean Catch  Result Value Ref Range Status   Specimen Description URINE, CLEAN CATCH  Final   Special Requests   Final    NONE Performed at [facility], [address], [place], [place] [address]    Culture (A)  Final    >=100,000 COLONIES/mL CITROBACTER FREUNDII >=100,000 COLONIES/mL PROTEUS MIRABILIS    Report Status 10/05/2023 FINAL  Final    Organism ID, Bacteria CITROBACTER FREUNDII (A)  Final   Organism ID, Bacteria PROTEUS MIRABILIS (A)  Final      Susceptibility   Citrobacter freundii - MIC*    CEFEPIME <=0.12 SENSITIVE Sensitive     CEFTRIAXONE >=64 RESISTANT Resistant     CIPROFLOXACIN <=0.25 SENSITIVE Sensitive     GENTAMICIN <=1 SENSITIVE Sensitive     IMIPENEM 1 SENSITIVE Sensitive     NITROFURANTOIN <=16 SENSITIVE Sensitive     TRIMETH/SULFA <=20 SENSITIVE Sensitive     PIP/TAZO 16 SENSITIVE Sensitive ug/mL    * >=100,000 COLONIES/mL CITROBACTER FREUNDII   Proteus mirabilis - MIC*    AMPICILLIN <=2 SENSITIVE Sensitive     CEFAZOLIN <=4 SENSITIVE Sensitive     CEFEPIME <=0.12 SENSITIVE Sensitive     CEFTRIAXONE <=0.25 SENSITIVE Sensitive     CIPROFLOXACIN <=0.25 SENSITIVE Sensitive     GENTAMICIN <=1 SENSITIVE Sensitive     IMIPENEM 2 SENSITIVE Sensitive     NITROFURANTOIN 128 RESISTANT Resistant     TRIMETH/SULFA <=20 SENSITIVE Sensitive     AMPICILLIN/SULBACTAM <=2 SENSITIVE Sensitive     PIP/TAZO <=4 SENSITIVE Sensitive ug/mL    * >=100,000 COLONIES/mL PROTEUS MIRABILIS    Labs: CBC: Recent Labs  Lab 11/27/23 0800 11/27/23 0832 11/27/23 1755 11/28/23 0825 11/29/23 0909  WBC 10.2  --  13.2* 7.4  --   NEUTROABS 7.6  --   --   --   --   HGB 12.4 12.6 10.4* 10.0* 10.7*  HCT 38.1 37.0 33.6* 30.6* 33.0*  MCV 94.8  --  100.6* 95.0  --   PLT 232  --  180 148*  --    Basic Metabolic Panel: Recent Labs  Lab 11/27/23 0800 11/27/23 0832 11/28/23 0825  NA 141 142 139  K 4.2 4.1 3.8  CL 98 101 105  CO2 29  --  28  GLUCOSE 220* 221* 97  BUN 17 19 11   CREATININE 0.99 1.00 0.92  CALCIUM 9.8  --  9.5   Liver Function Tests: Recent Labs  Lab 11/27/23 0800 11/28/23 0825  AST 25 18  ALT 27 20  ALKPHOS 87 67  BILITOT 0.6 0.6  PROT 5.9* 5.1*  ALBUMIN 3.2* 2.7*   CBG: Recent Labs  Lab 11/28/23 2012 11/29/23 0003 11/29/23 0359 11/29/23 0513 11/29/23 1115  GLUCAP 175* 136* 134*  135*  179*    Discharge time spent: 34 minutes.  Signed: Marcelino Duster, MD [facility] 11/29/2023

## 2023-11-29 NOTE — Plan of Care (Signed)

## 2023-11-29 NOTE — Anesthesia Postprocedure Evaluation (Signed)
Anesthesia Post Note  Patient: Claudia Keller  Procedure(s) Performed: ESOPHAGOGASTRODUODENOSCOPY (EGD) WITH PROPOFOL BIOPSY     Patient location during evaluation: Endoscopy Anesthesia Type: General Level of consciousness: awake and patient cooperative Pain management: pain level controlled Vital Signs Assessment: post-procedure vital signs reviewed and stable Respiratory status: spontaneous breathing, nonlabored ventilation and respiratory function stable Cardiovascular status: blood pressure returned to baseline and stable Postop Assessment: no apparent nausea or vomiting Anesthetic complications: no   No notable events documented.                  Mackynzie Woolford

## 2023-11-29 NOTE — Progress Notes (Signed)
PT Cancellation Note and Discharge  Patient Details Name: Claudia Keller MRN: [id number] DOB: 04/03/1951   Cancelled Treatment:    Reason Eval/Treat Not Completed: PT screened, no needs identified, will sign off. Reviewed OT evaluation and note pt is a resident at SNF who requires hoyer lift transfers and is wheelchair bound at baseline. Pt anticipates d/c back to SNF and per MD/CSW, PT evaluation is not required for transfer back to SNF. Pt does not require a formal PT evaluation at this time. PT signing off. If needs change, please reconsult.     Marylynn Pearson 11/29/2023, 9:55 AM  Conni Slipper, PT, DPT Acute Rehabilitation Services Secure Chat Preferred Office: [phone]

## 2023-11-30 ENCOUNTER — Encounter (HOSPITAL_COMMUNITY): Payer: Self-pay | Admitting: Gastroenterology

## 2023-11-30 LAB — SURGICAL PATHOLOGY

## 2023-12-04 ENCOUNTER — Encounter: Payer: Self-pay | Admitting: Gastroenterology

## 2023-12-05 NOTE — Progress Notes (Deleted)
 GI Office Note    Referring Provider: Marinda Elk, MD Primary Care Physician:  Marinda Elk, MD  Primary Gastroenterologist:  Chief Complaint   No chief complaint on file.   History of Present Illness   Claudia Keller is a 73 y.o. female presenting today for hospital follow-up.  Recently seen inpatient at [facility] by the GI team there for nausea/vomiting and hematemesis.  Patient reported black stool prior to presentation..  Also with history of IBS D now with constipation likely due to Colestid.  History of colon polyps with last colonoscopy in 2016.   EGD while inpatient on November 28, 2023: -LA grade C reflux esophagitis with no bleeding status post biopsy -2 cm hiatal hernia -Gastritis status post biopsy -Normal examined duodenum status post biopsy. -Advised pantoprazole twice daily for 8 weeks then go back to once daily.  2 weeks of sucralfate suspension prescribed.  If ongoing nausea/vomiting, recommended gastric emptying study off of Trulicity.  CT A/P with contrast 11/27/23:  IMPRESSION: 1. No acute findings within the abdomen or pelvis. No findings to account for the patient's symptoms. 2. Aortic atherosclerosis.  Colonoscopy April 2016: -Colonic diverticulosis.   -Multiple colonic polyps (tubular adenomas).  -status post segmental biopsies normal. -colonoscopy 5 years  Medications   Current Outpatient Medications  Medication Sig Dispense Refill   acetaminophen (TYLENOL) 325 MG tablet Take 650 mg by mouth every 8 (eight) hours as needed for mild pain or moderate pain.     albuterol (VENTOLIN HFA) 108 (90 Base) MCG/ACT inhaler Inhale 2 puffs into the lungs every 4 (four) hours as needed for wheezing or shortness of breath. 8 g 1   allopurinol (ZYLOPRIM) 100 MG tablet Take 50 mg by mouth daily. Idiopathic gout     aspirin 81 MG chewable tablet Chew 81 mg by mouth daily.     bisacodyl (DULCOLAX) 10 MG suppository Place 10 mg rectally daily as  needed (constipation not relieved by MOM).     Calcium Carb-Cholecalciferol (OYSTER SHELL CALCIUM W/D) 500-200 MG-UNIT TABS Take 1 tablet by mouth 2 (two) times daily.     carbamide peroxide (DEBROX) 6.5 % OTIC solution Place 3 drops into both ears once a week. Every Monday     colchicine 0.6 MG tablet Take 1 tablet (0.6 mg total) by mouth daily. 30 tablet 1   diltiazem (DILACOR XR) 180 MG 24 hr capsule Take 180 mg by mouth daily.     divalproex (DEPAKOTE ER) 500 MG 24 hr tablet Take 2 tablets (1,000 mg total) by mouth at bedtime.     Dulaglutide (TRULICITY) 1.5 MG/0.5ML SOAJ Inject 1.5 mg into the skin once a week. Every Wednesday     furosemide (LASIX) 20 MG tablet Take 1 tablet (20 mg total) by mouth every other day. 30 tablet    insulin aspart (NOVOLOG) 100 UNIT/ML injection Inject 0-10 Units into the skin in the morning, at noon, in the evening, and at bedtime. BS 0-150 inject 0 units  BS 151-200 inject 2 units  BS 201-250 inject 3 units  BS 251-300 inject 5 units  BS 301-350 inject 7 units  BS 351- 400 inject 10 units  BS 401-999 call MD     Insulin Glargine Solostar (LANTUS) 100 UNIT/ML Solostar Pen Inject 10 Units into the skin in the morning and at bedtime.     ipratropium-albuterol (DUONEB) 0.5-2.5 (3) MG/3ML SOLN Take 3 mLs by nebulization every 4 (four) hours as needed (SOB/wheezing).  levothyroxine (SYNTHROID) 125 MCG tablet Take 125 mcg by mouth daily before breakfast.     magnesium hydroxide (MILK OF MAGNESIA) 400 MG/5ML suspension Take 30 mLs by mouth daily as needed (if no BM in 3 days).     memantine (NAMENDA) 10 MG tablet Take 10 mg by mouth 2 (two) times daily.     mirtazapine (REMERON) 15 MG tablet Take 15 mg by mouth at bedtime.     Nutritional Supplements (NUTRITIONAL SUPPLEMENT PO) Take 1 each by mouth daily. Magic Cup     ondansetron (ZOFRAN) 4 MG tablet Take 1 tablet (4 mg total) by mouth every 6 (six) hours as needed for nausea or vomiting. 12 tablet 0    pantoprazole (PROTONIX) 40 MG tablet Take 1 tablet (40 mg total) by mouth 2 (two) times daily. Twice daily for 8 weeks then once daily 60 tablet 2   PARoxetine (PAXIL) 40 MG tablet Take 1 tablet (40 mg total) by mouth daily. 90 tablet 0   polyethylene glycol powder (GLYCOLAX/MIRALAX) 17 GM/SCOOP powder Take 17 g by mouth daily.     promethazine (PHENERGAN) 12.5 MG suppository Place 12.5 mg rectally every 6 (six) hours as needed for nausea or vomiting.     QUEtiapine (SEROQUEL XR) 200 MG 24 hr tablet Take 200 mg by mouth at bedtime.     rosuvastatin (CRESTOR) 40 MG tablet Take 1 tablet (40 mg total) by mouth daily. 90 tablet 3   saccharomyces boulardii (FLORASTOR) 250 MG capsule Take 250 mg by mouth daily.     senna-docusate (SENOKOT-S) 8.6-50 MG tablet Take 1 tablet by mouth at bedtime as needed for mild constipation.     Sodium Phosphates (RA SALINE ENEMA RE) Place 1 Dose rectally once as needed (severe constipation). If no relief from both milk of magnesia, and Bisacodyl suppository, administer one dose of disposable saline enema.     sucralfate (CARAFATE) 1 GM/10ML suspension Take 10 mLs (1 g total) by mouth 4 (four) times daily. 420 mL 1   vitamin B-12 (CYANOCOBALAMIN) 500 MCG tablet Take 500 mcg by mouth daily.     No current facility-administered medications for this visit.    Allergies   Allergies as of 12/06/2023   (No Known Allergies)     Past Medical History   Past Medical History:  Diagnosis Date   Acute respiratory disease due to COVID-19 virus 12/07/2020   AMS (altered mental status) 11/05/2021   11/04/2021 - 11/10/2021 hospitalized initially with metabolic encephalopathy in the context of hypoxic respiratory failure with acute on chronic CHF.  Pericardiocentesis performed for pericarditis related bloody fluid.  Altered mental status attributed to UTI clinically.  11/04/2021 cath urine revealed multiple species.  Full course of Rocephin completed.     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   Hypothyroidism    IBS (irritable bowel syndrome)    Ketoacidosis    Metabolic encephalopathy 11/09/2021   Mixed hyperlipidemia    PTSD (post-traumatic stress disorder)    S/P colonoscopy 11/2009   RMR: pancolonic diverticula, polypoid rectal mucosa  with  prominent lymphoid aggregates, repeat in Jan 2016    Stress incontinence, female    Tobacco use    Type 2 diabetes mellitus (HCC)    Urinary tract infection 11/05/2021    Past Surgical History   Past Surgical History:  Procedure Laterality Date   Bilateral tubal ligation     BIOPSY  11/28/2023   Procedure: BIOPSY;  Surgeon: Lynann Bologna, MD;  Location: [facility] ENDOSCOPY;  Service: Gastroenterology;;   BREAST BIOPSY Left 10/16/2014   negative   CHOLECYSTECTOMY  09/11/2012   Procedure: LAPAROSCOPIC CHOLECYSTECTOMY;  Surgeon: Dalia Heading, MD;  Location: [facility] ORS;  Service: General;  Laterality: N/A;   COLONOSCOPY  11/17/2009   Dr. Jena Gauss: normal rectum, pancolonic diverticula, polyps benign, no adenomas.    COLONOSCOPY N/A 02/12/2015   Dr. Jena Gauss: colonic diverticulosis, multiple colonic polyps (tubular adenomas), negative segmental biopsies. Surveillance in 2021   ESOPHAGOGASTRODUODENOSCOPY  11/23/2011   Dr. Jena Gauss: Erosive reflux esophagitis; small hiatal hernia; esophagus dilated empirically   ESOPHAGOGASTRODUODENOSCOPY (EGD) WITH PROPOFOL N/A 11/28/2023   Procedure: ESOPHAGOGASTRODUODENOSCOPY (EGD) WITH PROPOFOL;  Surgeon: Lynann Bologna, MD;  Location: [facility] ENDOSCOPY;  Service: Gastroenterology;  Laterality: N/A;   MALONEY DILATION  11/23/2011    Procedure: Elease Hashimoto DILATION;  Surgeon: Corbin Ade, MD;  Location: [facility] ENDO SUITE;  Service: Endoscopy;  Laterality: N/A;   PERICARDIOCENTESIS N/A 11/06/2021   Procedure: PERICARDIOCENTESIS;  Surgeon: Corky Crafts, MD;  Location: [facility] INVASIVE CV LAB;  Service: Cardiovascular;  Laterality: N/A;   SKIN LESION EXCISION     on buttocks   TUBAL LIGATION      Past Family History   Family History  Problem Relation Age of Onset   Cancer Mother        unsure what type   Heart disease Father    Heart attack Father    Schizophrenia Brother    Stroke Brother    Cancer Son        bladder   Coronary artery disease Other    Diabetes type II Other    Colon cancer Neg Hx    Breast cancer Neg Hx     Past Social History   Social History   Socioeconomic History   Marital status: Legally Separated    Spouse name: Not on file   Number of children: Not on file   Years of education: Not on file   Highest education level: Not on file  Occupational History   Not on file  Tobacco Use   Smoking status: Every Day    Current packs/day: 2.00    Average packs/day: 2.0 packs/day for 43.0 years (86.0 ttl pk-yrs)    Types: Cigarettes   Smokeless tobacco: Never  Vaping Use   Vaping status: Never Used  Substance and Sexual Activity   Alcohol use: No    Alcohol/week: 0.0 standard drinks of alcohol   Drug use: No   Sexual activity: Not Currently    Birth control/protection: Surgical, Post-menopausal    Comment: tubal  Other Topics Concern   Not on file  Social History Narrative   Not on file   Social Drivers of Health   Financial Resource Strain: Not on file  Food Insecurity: No Food Insecurity (11/28/2023)   Hunger Vital Sign    Worried About Running Out of Food in the Last Year: Never true    Ran Out of Food in the Last Year: Never true  Transportation Needs: No Transportation Needs (11/28/2023)   PRAPARE - Transportation  Lack of Transportation (Medical): No    Lack of  Transportation (Non-Medical): No  Physical Activity: Not on file  Stress: Not on file  Social Connections: Unknown (11/28/2023)   Social Connection and Isolation Panel [NHANES]    Frequency of Communication with Friends and Family: Once a week    Frequency of Social Gatherings with Friends and Family: Once a week    Attends Religious Services: Not on Marketing executive or Organizations: Not on file    Attends Banker Meetings: Not on file    Marital Status: Not on file  Intimate Partner Violence: Not At Risk (11/28/2023)   Humiliation, Afraid, Rape, and Kick questionnaire    Fear of Current or Ex-Partner: No    Emotionally Abused: No    Physically Abused: No    Sexually Abused: No    Review of Systems   General: Negative for anorexia, weight loss, fever, chills, fatigue, weakness. ENT: Negative for hoarseness, difficulty swallowing , nasal congestion. CV: Negative for chest pain, angina, palpitations, dyspnea on exertion, peripheral edema.  Respiratory: Negative for dyspnea at rest, dyspnea on exertion, cough, sputum, wheezing.  GI: See history of present illness. GU:  Negative for dysuria, hematuria, urinary incontinence, urinary frequency, nocturnal urination.  Endo: Negative for unusual weight change.     Physical Exam   There were no vitals taken for this visit.   General: Well-nourished, well-developed in no acute distress.  Eyes: No icterus. Mouth: Oropharyngeal mucosa moist and pink , no lesions erythema or exudate. Lungs: Clear to auscultation bilaterally.  Heart: Regular rate and rhythm, no murmurs rubs or gallops.  Abdomen: Bowel sounds are normal, nontender, nondistended, no hepatosplenomegaly or masses,  no abdominal bruits or hernia , no rebound or guarding.  Rectal: ***  Extremities: No lower extremity edema. No clubbing or deformities. Neuro: Alert and oriented x 4   Skin: Warm and dry, no jaundice.   Psych: Alert and cooperative,  normal mood and affect.  Labs   *** Imaging Studies   CT ABDOMEN PELVIS W CONTRAST Result Date: 11/27/2023 CLINICAL DATA:  Abdominal pain.  Nausea vomiting. EXAM: CT ABDOMEN AND PELVIS WITH CONTRAST TECHNIQUE: Multidetector CT imaging of the abdomen and pelvis was performed using the standard protocol following bolus administration of intravenous contrast. RADIATION DOSE REDUCTION: This exam was performed according to the departmental dose-optimization program which includes automated exposure control, adjustment of the mA and/or kV according to patient size and/or use of iterative reconstruction technique. CONTRAST:  75mL OMNIPAQUE IOHEXOL 350 MG/ML SOLN COMPARISON:  10/02/2023 and older exams. FINDINGS: Lower chest: No acute abnormality. Hepatobiliary: Liver normal in size and attenuation. Scattered calcifications consistent with healed granuloma, stable. No mass. Status post cholecystectomy. No bile duct dilation. Pancreas: No pancreatic mass or inflammation. Small medial duodenal diverticulum adjacent to the anterior elevator. This is stable. Spleen: Normal. Adrenals/Urinary Tract: No adrenal masses. Kidneys normal in size, orientation and position. Tiny intrarenal stone in the upper pole the left kidney. Small mid to lower pole right intrarenal stones. No hydronephrosis. Ureters are normal in course and in caliber. Bladder is unremarkable. Stomach/Bowel: Normal stomach. Small bowel and colon are normal in caliber. No wall thickening. No inflammation. Scattered left colon diverticula. Normal appendix. Vascular/Lymphatic: Aortic atherosclerosis. No aneurysm. No enlarged lymph nodes. Reproductive: Uterus and bilateral adnexa are unremarkable. Other: No abdominal wall hernia or abnormality. No abdominopelvic ascites. Musculoskeletal: No fracture or acute finding.  No bone lesion. IMPRESSION: 1. No acute findings  within the abdomen or pelvis. No findings to account for the patient's symptoms. 2. Aortic  atherosclerosis. Aortic Atherosclerosis (ICD10-I70.0). Electronically Signed   By: Amie Portland M.D.   On: 11/27/2023 12:27   DG Chest Port 1 View Result Date: 11/27/2023 CLINICAL DATA:  Hematemesis. EXAM: PORTABLE CHEST 1 VIEW COMPARISON:  09/02/2023 FINDINGS: Lungs are adequately inflated without focal airspace consolidation or effusion. Cardiomediastinal silhouette and remainder of the exam is unchanged. IMPRESSION: No active disease. Electronically Signed   By: Elberta Fortis M.D.   On: 11/27/2023 08:51    Assessment       PLAN   ***   Leanna Battles. Melvyn Neth, MHS, PA-C [facility]

## 2023-12-06 ENCOUNTER — Inpatient Hospital Stay: Payer: Medicare Other | Admitting: Gastroenterology

## 2023-12-13 ENCOUNTER — Ambulatory Visit (INDEPENDENT_AMBULATORY_CARE_PROVIDER_SITE_OTHER): Payer: Medicare Other | Admitting: Gastroenterology

## 2023-12-13 ENCOUNTER — Encounter: Payer: Self-pay | Admitting: Gastroenterology

## 2023-12-13 VITALS — BP 132/64 | HR 78 | Temp 97.3°F | Ht 64.0 in

## 2023-12-13 DIAGNOSIS — K581 Irritable bowel syndrome with constipation: Secondary | ICD-10-CM

## 2023-12-13 DIAGNOSIS — K2100 Gastro-esophageal reflux disease with esophagitis, without bleeding: Secondary | ICD-10-CM

## 2023-12-13 DIAGNOSIS — R112 Nausea with vomiting, unspecified: Secondary | ICD-10-CM

## 2023-12-13 DIAGNOSIS — K219 Gastro-esophageal reflux disease without esophagitis: Secondary | ICD-10-CM | POA: Diagnosis not present

## 2023-12-13 DIAGNOSIS — Z860101 Personal history of adenomatous and serrated colon polyps: Secondary | ICD-10-CM

## 2023-12-13 DIAGNOSIS — Z860100 Personal history of colon polyps, unspecified: Secondary | ICD-10-CM

## 2023-12-13 DIAGNOSIS — D649 Anemia, unspecified: Secondary | ICD-10-CM | POA: Diagnosis not present

## 2023-12-13 NOTE — Progress Notes (Signed)
 GI Office Note    Referring Provider: Iver Elsie HERO, MD Primary Care Physician:  Iver Elsie HERO, MD  Primary Gastroenterologist: Ozell Hollingshead, MD   Chief Complaint   Chief Complaint  Patient presents with   Hospitalization Follow-up    States that she has episodes of vomiting    History of Present Illness   Claudia Keller is a 73 y.o. female with multiple comorbidities, status post stroke couple of years ago has been residing in a skilled nursing facility since that time who presents today for hospital follow-up.  GI standpoint she has a history of IBS, adenomatous colon polyps, GERD.     Recently seen inpatient at [facility] by the GI team there for nausea/vomiting and hematemesis.  Patient reported black stool prior to presentation..  Also with history of IBS-D now with constipation likely due to Colestid .  History of colon polyps with last colonoscopy in 2016.   EGD while inpatient on November 28, 2023: -LA grade C reflux esophagitis with no bleeding status post biopsy -2 cm hiatal hernia -Gastritis status post biopsy -Normal examined duodenum status post biopsy. -Advised pantoprazole  twice daily for 8 weeks then go back to once daily.  2 weeks of sucralfate  suspension prescribed.  If ongoing nausea/vomiting, recommended gastric emptying study off of Trulicity.   CT A/P with contrast 11/27/23: IMPRESSION: 1. No acute findings within the abdomen or pelvis. No findings to account for the patient's symptoms. 2. Aortic atherosclerosis.   Colonoscopy April 2016: -Colonic diverticulosis.   -Multiple colonic polyps (tubular adenomas).  -status post segmental biopsies normal. -colonoscopy 5 years  Today: Patient presents alone today.  She is alert and oriented to person, place, time.  She is nonambulatory.  Left-sided weakness since her stroke a couple of years ago.  She denies no n/v in two weeks but prior to that would have episodic projectile vomiting.  She really  could not say how frequently symptoms were occur but not weekly and seems somewhat sporadic.  She is not sure how long she has been on Trulicity.  Denies any heartburn lately.  Denies dysphagia, abdominal pain.  She still has trouble with constipation, stools are hard and she has to strain.  Started on MiraLAX  17 g daily a couple of weeks ago when returned to the facility after hospitalization.  She denies any further melena or rectal bleeding but notes that she requires assistance with her bowel movements/cleaning.   We briefly discussed today that she is overdue for surveillance colonoscopy.  We discussed bowel preparation and the fact that it would be difficult given that she is not ambulatory.  She is not sure at this time if she wants to proceed, she is mostly interested in management of her upper GI symptoms.  We can readdress at a later date.   Medications   Current Outpatient Medications  Medication Sig Dispense Refill   acetaminophen  (TYLENOL ) 325 MG tablet Take 650 mg by mouth every 8 (eight) hours as needed for mild pain or moderate pain.     albuterol  (VENTOLIN  HFA) 108 (90 Base) MCG/ACT inhaler Inhale 2 puffs into the lungs every 4 (four) hours as needed for wheezing or shortness of breath. 8 g 1   allopurinol  (ZYLOPRIM ) 100 MG tablet Take 50 mg by mouth daily. Idiopathic gout     aspirin  81 MG chewable tablet Chew 81 mg by mouth daily.     bisacodyl (DULCOLAX) 10 MG suppository Place 10 mg rectally daily as needed (constipation  not relieved by MOM).     Calcium  Carb-Cholecalciferol  (OYSTER SHELL CALCIUM  W/D) 500-200 MG-UNIT TABS Take 1 tablet by mouth 2 (two) times daily.     carbamide peroxide (DEBROX) 6.5 % OTIC solution Place 3 drops into both ears once a week. Every Monday     colchicine  0.6 MG tablet Take 1 tablet (0.6 mg total) by mouth daily. 30 tablet 1   diltiazem  (DILACOR XR ) 180 MG 24 hr capsule Take 180 mg by mouth daily.     divalproex  (DEPAKOTE  ER) 500 MG 24 hr tablet  Take 2 tablets (1,000 mg total) by mouth at bedtime.     Dulaglutide (TRULICITY) 1.5 MG/0.5ML SOAJ Inject 1.5 mg into the skin once a week. Every Wednesday     furosemide  (LASIX ) 20 MG tablet Take 1 tablet (20 mg total) by mouth every other day. 30 tablet    insulin  aspart (NOVOLOG ) 100 UNIT/ML injection Inject 0-10 Units into the skin in the morning, at noon, in the evening, and at bedtime. BS 0-150 inject 0 units  BS 151-200 inject 2 units  BS 201-250 inject 3 units  BS 251-300 inject 5 units  BS 301-350 inject 7 units  BS 351- 400 inject 10 units  BS 401-999 call MD     Insulin  Glargine Solostar (LANTUS ) 100 UNIT/ML Solostar Pen Inject 10 Units into the skin in the morning and at bedtime.     ipratropium-albuterol  (DUONEB) 0.5-2.5 (3) MG/3ML SOLN Take 3 mLs by nebulization every 4 (four) hours as needed (SOB/wheezing).     levothyroxine  (SYNTHROID ) 125 MCG tablet Take 125 mcg by mouth daily before breakfast.     magnesium  hydroxide (MILK OF MAGNESIA) 400 MG/5ML suspension Take 30 mLs by mouth daily as needed (if no BM in 3 days).     memantine  (NAMENDA ) 10 MG tablet Take 10 mg by mouth 2 (two) times daily.     mirtazapine  (REMERON ) 15 MG tablet Take 15 mg by mouth at bedtime.     ondansetron  (ZOFRAN ) 4 MG tablet Take 1 tablet (4 mg total) by mouth every 6 (six) hours as needed for nausea or vomiting. 12 tablet 0   pantoprazole  (PROTONIX ) 40 MG tablet Take 1 tablet (40 mg total) by mouth 2 (two) times daily. Twice daily for 8 weeks then once daily 60 tablet 2   PARoxetine  (PAXIL ) 40 MG tablet Take 1 tablet (40 mg total) by mouth daily. 90 tablet 0   polyethylene glycol powder (GLYCOLAX /MIRALAX ) 17 GM/SCOOP powder Take 17 g by mouth daily.     promethazine  (PHENERGAN ) 12.5 MG suppository Place 12.5 mg rectally every 6 (six) hours as needed for nausea or vomiting.     QUEtiapine  (SEROQUEL  XR) 200 MG 24 hr tablet Take 200 mg by mouth at bedtime.     rosuvastatin  (CRESTOR ) 40 MG tablet Take 1  tablet (40 mg total) by mouth daily. 90 tablet 3   saccharomyces boulardii (FLORASTOR) 250 MG capsule Take 250 mg by mouth daily.     senna-docusate (SENOKOT-S) 8.6-50 MG tablet Take 1 tablet by mouth at bedtime as needed for mild constipation.     Sodium Phosphates (RA SALINE ENEMA RE) Place 1 Dose rectally once as needed (severe constipation). If no relief from both milk of magnesia, and Bisacodyl suppository, administer one dose of disposable saline enema.     sucralfate  (CARAFATE ) 1 GM/10ML suspension Take 10 mLs (1 g total) by mouth 4 (four) times daily. 420 mL 1   vitamin B-12 (CYANOCOBALAMIN ) 500 MCG  tablet Take 500 mcg by mouth daily.     Nutritional Supplements (NUTRITIONAL SUPPLEMENT PO) Take 1 each by mouth daily. Magic Cup     No current facility-administered medications for this visit.    Allergies   Allergies as of 12/13/2023   (No Known Allergies)     Past Medical History   Past Medical History:  Diagnosis Date   Acute respiratory disease due to COVID-19 virus 12/07/2020   AMS (altered mental status) 11/05/2021   11/04/2021 - 11/10/2021 hospitalized initially with metabolic encephalopathy in the context of hypoxic respiratory failure with acute on chronic CHF.  Pericardiocentesis performed for pericarditis related bloody fluid.  Altered mental status attributed to UTI clinically.  11/04/2021 cath urine revealed multiple species.  Full course of Rocephin  completed.     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   Hypothyroidism    IBS (irritable bowel syndrome)    Ketoacidosis    Metabolic encephalopathy  11/09/2021   Mixed hyperlipidemia    PTSD (post-traumatic stress disorder)    S/P colonoscopy 11/2009   RMR: pancolonic diverticula, polypoid rectal mucosa with  prominent lymphoid aggregates, repeat in Jan 2016    Stress incontinence, female    Tobacco use    Type 2 diabetes mellitus (HCC)    Urinary tract infection 11/05/2021    Past Surgical History   Past Surgical History:  Procedure Laterality Date   Bilateral tubal ligation     BIOPSY  11/28/2023   Procedure: BIOPSY;  Surgeon: Charlanne Groom, MD;  Location: [facility] ENDOSCOPY;  Service: Gastroenterology;;   BREAST BIOPSY Left 10/16/2014   negative   CHOLECYSTECTOMY  09/11/2012   Procedure: LAPAROSCOPIC CHOLECYSTECTOMY;  Surgeon: Oneil DELENA Budge, MD;  Location: [facility] ORS;  Service: General;  Laterality: N/A;   COLONOSCOPY  11/17/2009   Dr. Shaaron: normal rectum, pancolonic diverticula, polyps benign, no adenomas.    COLONOSCOPY N/A 02/12/2015   Dr. Shaaron: colonic diverticulosis, multiple colonic polyps (tubular adenomas), negative segmental biopsies. Surveillance in 2021   ESOPHAGOGASTRODUODENOSCOPY  11/23/2011   Dr. Shaaron: Erosive reflux esophagitis; small hiatal hernia; esophagus dilated empirically   ESOPHAGOGASTRODUODENOSCOPY (EGD) WITH PROPOFOL  N/A 11/28/2023   Procedure: ESOPHAGOGASTRODUODENOSCOPY (EGD) WITH PROPOFOL ;  Surgeon: Charlanne Groom, MD;  Location: [facility] ENDOSCOPY;  Service: Gastroenterology;  Laterality: N/A;   MALONEY DILATION  11/23/2011   Procedure: AGAPITO DILATION;  Surgeon: Lamar CHRISTELLA Shaaron, MD;  Location: [facility] ENDO SUITE;  Service: Endoscopy;  Laterality: N/A;   PERICARDIOCENTESIS N/A 11/06/2021   Procedure: PERICARDIOCENTESIS;  Surgeon: Dann Candyce RAMAN, MD;  Location: [facility] INVASIVE CV LAB;  Service: Cardiovascular;  Laterality: N/A;   SKIN LESION EXCISION     on buttocks   TUBAL LIGATION      Past Family History   Family History  Problem Relation Age of Onset   Cancer Mother        unsure what type   Heart disease  Father    Heart attack Father    Schizophrenia Brother    Stroke Brother    Cancer Son  bladder   Coronary artery disease Other    Diabetes type II Other    Colon cancer Neg Hx    Breast cancer Neg Hx     Past Social History   Social History   Socioeconomic History   Marital status: Legally Separated    Spouse name: Not on file   Number of children: Not on file   Years of education: Not on file   Highest education level: Not on file  Occupational History   Not on file  Tobacco Use   Smoking status: Every Day    Current packs/day: 2.00    Average packs/day: 2.0 packs/day for 43.0 years (86.0 ttl pk-yrs)    Types: Cigarettes   Smokeless tobacco: Never  Vaping Use   Vaping status: Never Used  Substance and Sexual Activity   Alcohol  use: No    Alcohol /week: 0.0 standard drinks of alcohol    Drug use: No   Sexual activity: Not Currently    Birth control/protection: Surgical, Post-menopausal    Comment: tubal  Other Topics Concern   Not on file  Social History Narrative   Not on file   Social Drivers of Health   Financial Resource Strain: Not on file  Food Insecurity: No Food Insecurity (11/28/2023)   Hunger Vital Sign    Worried About Running Out of Food in the Last Year: Never true    Ran Out of Food in the Last Year: Never true  Transportation Needs: No Transportation Needs (11/28/2023)   PRAPARE - Administrator, Civil Service (Medical): No    Lack of Transportation (Non-Medical): No  Physical Activity: Not on file  Stress: Not on file  Social Connections: Unknown (11/28/2023)   Social Connection and Isolation Panel [NHANES]    Frequency of Communication with Friends and Family: Once a week    Frequency of Social Gatherings with Friends and Family: Once a week    Attends Religious Services: Not on Marketing Executive or Organizations: Not on file    Attends Banker Meetings: Not on file    Marital Status: Not on file   Intimate Partner Violence: Not At Risk (11/28/2023)   Humiliation, Afraid, Rape, and Kick questionnaire    Fear of Current or Ex-Partner: No    Emotionally Abused: No    Physically Abused: No    Sexually Abused: No    Review of Systems   General: Negative for anorexia, weight loss, fever, chills, fatigue, weakness. ENT: Negative for hoarseness, difficulty swallowing , nasal congestion. CV: Negative for chest pain, angina, palpitations, dyspnea on exertion, peripheral edema.  Respiratory: Negative for dyspnea at rest, dyspnea on exertion, cough, sputum, wheezing.  GI: See history of present illness. GU:  Negative for dysuria, hematuria, urinary incontinence, urinary frequency, nocturnal urination.  Endo: Negative for unusual weight change.     Physical Exam   BP 132/64 (BP Location: Left Arm, Patient Position: Sitting, Cuff Size: Normal)   Pulse 78   Temp (!) 97.3 F (36.3 C) (Temporal)   Ht 5' 4 (1.626 m)   SpO2 97%   BMI 36.05 kg/m    General: Elderly female, in wheelchair well-nourished, well-developed in no acute distress.  Eyes: No icterus. Mouth: Oropharyngeal mucosa moist and pink , no lesions erythema or exudate. Lungs: Clear to auscultation bilaterally.  Heart: Regular rate and rhythm, no murmurs rubs or gallops.  Abdomen: Bowel sounds are normal, nontender, nondistended, no hepatosplenomegaly or masses,  no  abdominal bruits or hernia , no rebound or guarding.  Exam limited in the sitting position Rectal: Not performed Extremities: No lower extremity edema. No clubbing or deformities. Neuro: Alert and oriented x 4   Skin: Warm and dry, no jaundice.   Psych: Alert and cooperative, normal mood and affect.  Labs   Lab Results  Component Value Date   WBC 7.4 11/28/2023   HGB 10.7 (L) 11/29/2023   HCT 33.0 (L) 11/29/2023   MCV 95.0 11/28/2023   PLT 148 (L) 11/28/2023   Lab Results  Component Value Date   NA 139 11/28/2023   CL 105 11/28/2023   K 3.8  11/28/2023   CO2 28 11/28/2023   BUN 11 11/28/2023   CREATININE 0.92 11/28/2023   GFRNONAA >60 11/28/2023   CALCIUM  9.5 11/28/2023   PHOS 2.6 11/06/2021   ALBUMIN  2.7 (L) 11/28/2023   GLUCOSE 97 11/28/2023   Lab Results  Component Value Date   ALT 20 11/28/2023   AST 18 11/28/2023   ALKPHOS 67 11/28/2023   BILITOT 0.6 11/28/2023   Lab Results  Component Value Date   VITAMINB12 1,388 (H) 11/28/2023   Lab Results  Component Value Date   IRON 52 11/28/2023   TIBC 364 11/28/2023   FERRITIN 29 11/28/2023   Lab Results  Component Value Date   FOLATE 29.3 11/28/2023   Lab Results  Component Value Date   HGBA1C 6.8 (H) 11/27/2023    Imaging Studies   CT ABDOMEN PELVIS W CONTRAST Result Date: 11/27/2023 CLINICAL DATA:  Abdominal pain.  Nausea vomiting. EXAM: CT ABDOMEN AND PELVIS WITH CONTRAST TECHNIQUE: Multidetector CT imaging of the abdomen and pelvis was performed using the standard protocol following bolus administration of intravenous contrast. RADIATION DOSE REDUCTION: This exam was performed according to the departmental dose-optimization program which includes automated exposure control, adjustment of the mA and/or kV according to patient size and/or use of iterative reconstruction technique. CONTRAST:  75mL OMNIPAQUE  IOHEXOL  350 MG/ML SOLN COMPARISON:  10/02/2023 and older exams. FINDINGS: Lower chest: No acute abnormality. Hepatobiliary: Liver normal in size and attenuation. Scattered calcifications consistent with healed granuloma, stable. No mass. Status post cholecystectomy. No bile duct dilation. Pancreas: No pancreatic mass or inflammation. Small medial duodenal diverticulum adjacent to the anterior elevator. This is stable. Spleen: Normal. Adrenals/Urinary Tract: No adrenal masses. Kidneys normal in size, orientation and position. Tiny intrarenal stone in the upper pole the left kidney. Small mid to lower pole right intrarenal stones. No hydronephrosis. Ureters are  normal in course and in caliber. Bladder is unremarkable. Stomach/Bowel: Normal stomach. Small bowel and colon are normal in caliber. No wall thickening. No inflammation. Scattered left colon diverticula. Normal appendix. Vascular/Lymphatic: Aortic atherosclerosis. No aneurysm. No enlarged lymph nodes. Reproductive: Uterus and bilateral adnexa are unremarkable. Other: No abdominal wall hernia or abnormality. No abdominopelvic ascites. Musculoskeletal: No fracture or acute finding.  No bone lesion. IMPRESSION: 1. No acute findings within the abdomen or pelvis. No findings to account for the patient's symptoms. 2. Aortic atherosclerosis. Aortic Atherosclerosis (ICD10-I70.0). Electronically Signed   By: Alm Parkins M.D.   On: 11/27/2023 12:27   DG Chest Port 1 View Result Date: 11/27/2023 CLINICAL DATA:  Hematemesis. EXAM: PORTABLE CHEST 1 VIEW COMPARISON:  09/02/2023 FINDINGS: Lungs are adequately inflated without focal airspace consolidation or effusion. Cardiomediastinal silhouette and remainder of the exam is unchanged. IMPRESSION: No active disease. Electronically Signed   By: Toribio Agreste M.D.   On: 11/27/2023 08:51    Assessment/Plan:  Nausea/vomiting/hematemesis: -Etiology of vomiting not clear, may be due to poorly controlled reflux, diabetic gastroparesis, medication related gastroparesis in the setting of Trulicity.  Really not clear for reflux esophagitis secondary to recurrent vomiting or GERD itself.   -Maintain adequate control of diabetes, last A1c was 6.8. -No vomiting in the past 2 weeks, will continue to monitor.  We have requested the SNF staff to let us  know if she has recurrent vomiting, consider gastric emptying study at that time possibly off Trulicity. -Continue pantoprazole  40 mg twice daily for total of 8 weeks, then decrease to once daily. -Return office visit in 3 months  Anemia: -Hemoglobin 11.8 back in November, 12.4 with recent presentation, dropped down to 10 during  hospitalization. -B12, folate, iron all normal. -Update CBC at this time  IBS: -Predominance of the constipation at this time, likely exacerbated by medication side effects -Increase MiraLAX  to 17 g twice daily until soft stool, then continue once daily.  If this does not adequately control her constipation, would consider prescription options such as Trulance or Linzess.  History of adenomatous colon polyps: -Overdue for surveillance colonoscopy, patient is not sure she wants to pursue at this time given her physical limitations which is not unreasonable -Will discuss again at next office visit in 3 months   Sonny RAMAN. Ezzard, MHS, PA-C [facility]

## 2023-12-13 NOTE — Patient Instructions (Addendum)
 Increase miralax  to 17 grams twice daily until patient has soft stools, then decrease back to once daily. Continue pantoprazole  40mg  BID for total of 8 weeks, then once daily thereafter. Please notify us  if/when patient has recurrent nausea/vomiting. Suspect symptoms could be due to delayed gastric emptying in setting of Trulicity. If recurrent N/V, may require gastric emptying study off potentially off Trulicity. Patient is overdue for surveillance colonoscopy for history of adenomatous colon polyps, we discussed today, but she is not sure if she wants to pursue at this time. CBC in 2 weeks, please fax results to [phone]. Return office visit in three months.

## 2024-02-23 ENCOUNTER — Encounter: Payer: Self-pay | Admitting: Gastroenterology

## 2024-06-11 ENCOUNTER — Emergency Department (HOSPITAL_COMMUNITY)

## 2024-06-11 ENCOUNTER — Inpatient Hospital Stay (HOSPITAL_COMMUNITY)
Admission: EM | Admit: 2024-06-11 | Discharge: 2024-06-15 | DRG: 871 | Disposition: A | Discharge status: Hospice | Source: Skilled Nursing Facility | Attending: Internal Medicine | Admitting: Internal Medicine

## 2024-06-11 ENCOUNTER — Other Ambulatory Visit: Payer: Self-pay

## 2024-06-11 ENCOUNTER — Encounter (HOSPITAL_COMMUNITY): Payer: Self-pay

## 2024-06-11 DIAGNOSIS — Z8659 Personal history of other mental and behavioral disorders: Secondary | ICD-10-CM

## 2024-06-11 DIAGNOSIS — D696 Thrombocytopenia, unspecified: Secondary | ICD-10-CM | POA: Insufficient documentation

## 2024-06-11 DIAGNOSIS — Z8719 Personal history of other diseases of the digestive system: Secondary | ICD-10-CM

## 2024-06-11 DIAGNOSIS — F03C0 Unspecified dementia, severe, without behavioral disturbance, psychotic disturbance, mood disturbance, and anxiety: Secondary | ICD-10-CM | POA: Diagnosis not present

## 2024-06-11 DIAGNOSIS — L89153 Pressure ulcer of sacral region, stage 3: Secondary | ICD-10-CM | POA: Diagnosis present

## 2024-06-11 DIAGNOSIS — Z1152 Encounter for screening for COVID-19: Secondary | ICD-10-CM | POA: Diagnosis not present

## 2024-06-11 DIAGNOSIS — F319 Bipolar disorder, unspecified: Secondary | ICD-10-CM | POA: Diagnosis present

## 2024-06-11 DIAGNOSIS — G9341 Metabolic encephalopathy: Secondary | ICD-10-CM | POA: Diagnosis present

## 2024-06-11 DIAGNOSIS — D6489 Other specified anemias: Secondary | ICD-10-CM | POA: Diagnosis present

## 2024-06-11 DIAGNOSIS — E039 Hypothyroidism, unspecified: Secondary | ICD-10-CM | POA: Diagnosis present

## 2024-06-11 DIAGNOSIS — I130 Hypertensive heart and chronic kidney disease with heart failure and stage 1 through stage 4 chronic kidney disease, or unspecified chronic kidney disease: Secondary | ICD-10-CM | POA: Diagnosis present

## 2024-06-11 DIAGNOSIS — D6959 Other secondary thrombocytopenia: Secondary | ICD-10-CM | POA: Diagnosis present

## 2024-06-11 DIAGNOSIS — R4702 Dysphasia: Secondary | ICD-10-CM | POA: Diagnosis present

## 2024-06-11 DIAGNOSIS — E1122 Type 2 diabetes mellitus with diabetic chronic kidney disease: Secondary | ICD-10-CM | POA: Diagnosis present

## 2024-06-11 DIAGNOSIS — I3139 Other pericardial effusion (noninflammatory): Secondary | ICD-10-CM | POA: Diagnosis present

## 2024-06-11 DIAGNOSIS — J690 Pneumonitis due to inhalation of food and vomit: Secondary | ICD-10-CM | POA: Diagnosis present

## 2024-06-11 DIAGNOSIS — I69391 Dysphagia following cerebral infarction: Secondary | ICD-10-CM

## 2024-06-11 DIAGNOSIS — Z79890 Hormone replacement therapy (postmenopausal): Secondary | ICD-10-CM

## 2024-06-11 DIAGNOSIS — J440 Chronic obstructive pulmonary disease with acute lower respiratory infection: Secondary | ICD-10-CM | POA: Diagnosis present

## 2024-06-11 DIAGNOSIS — Z794 Long term (current) use of insulin: Secondary | ICD-10-CM | POA: Diagnosis not present

## 2024-06-11 DIAGNOSIS — I69354 Hemiplegia and hemiparesis following cerebral infarction affecting left non-dominant side: Secondary | ICD-10-CM

## 2024-06-11 DIAGNOSIS — E66812 Obesity, class 2: Secondary | ICD-10-CM | POA: Diagnosis present

## 2024-06-11 DIAGNOSIS — Z7985 Long-term (current) use of injectable non-insulin antidiabetic drugs: Secondary | ICD-10-CM

## 2024-06-11 DIAGNOSIS — F0394 Unspecified dementia, unspecified severity, with anxiety: Secondary | ICD-10-CM | POA: Diagnosis present

## 2024-06-11 DIAGNOSIS — Z8616 Personal history of COVID-19: Secondary | ICD-10-CM

## 2024-06-11 DIAGNOSIS — R627 Adult failure to thrive: Secondary | ICD-10-CM | POA: Diagnosis present

## 2024-06-11 DIAGNOSIS — N179 Acute kidney failure, unspecified: Secondary | ICD-10-CM | POA: Diagnosis present

## 2024-06-11 DIAGNOSIS — F0390 Unspecified dementia without behavioral disturbance: Secondary | ICD-10-CM | POA: Diagnosis present

## 2024-06-11 DIAGNOSIS — I480 Paroxysmal atrial fibrillation: Secondary | ICD-10-CM | POA: Diagnosis not present

## 2024-06-11 DIAGNOSIS — Z66 Do not resuscitate: Secondary | ICD-10-CM | POA: Diagnosis present

## 2024-06-11 DIAGNOSIS — I2510 Atherosclerotic heart disease of native coronary artery without angina pectoris: Secondary | ICD-10-CM | POA: Diagnosis present

## 2024-06-11 DIAGNOSIS — K589 Irritable bowel syndrome without diarrhea: Secondary | ICD-10-CM | POA: Diagnosis present

## 2024-06-11 DIAGNOSIS — N1831 Chronic kidney disease, stage 3a: Secondary | ICD-10-CM | POA: Diagnosis present

## 2024-06-11 DIAGNOSIS — K219 Gastro-esophageal reflux disease without esophagitis: Secondary | ICD-10-CM | POA: Diagnosis present

## 2024-06-11 DIAGNOSIS — F419 Anxiety disorder, unspecified: Secondary | ICD-10-CM | POA: Diagnosis present

## 2024-06-11 DIAGNOSIS — E870 Hyperosmolality and hypernatremia: Secondary | ICD-10-CM | POA: Diagnosis present

## 2024-06-11 DIAGNOSIS — B9620 Unspecified Escherichia coli [E. coli] as the cause of diseases classified elsewhere: Secondary | ICD-10-CM | POA: Diagnosis present

## 2024-06-11 DIAGNOSIS — N390 Urinary tract infection, site not specified: Secondary | ICD-10-CM | POA: Diagnosis present

## 2024-06-11 DIAGNOSIS — F32A Depression, unspecified: Secondary | ICD-10-CM | POA: Insufficient documentation

## 2024-06-11 DIAGNOSIS — N3000 Acute cystitis without hematuria: Secondary | ICD-10-CM | POA: Diagnosis not present

## 2024-06-11 DIAGNOSIS — Z515 Encounter for palliative care: Secondary | ICD-10-CM | POA: Diagnosis not present

## 2024-06-11 DIAGNOSIS — B952 Enterococcus as the cause of diseases classified elsewhere: Secondary | ICD-10-CM | POA: Diagnosis present

## 2024-06-11 DIAGNOSIS — Z8673 Personal history of transient ischemic attack (TIA), and cerebral infarction without residual deficits: Secondary | ICD-10-CM | POA: Diagnosis not present

## 2024-06-11 DIAGNOSIS — N200 Calculus of kidney: Secondary | ICD-10-CM | POA: Diagnosis present

## 2024-06-11 DIAGNOSIS — E785 Hyperlipidemia, unspecified: Secondary | ICD-10-CM | POA: Insufficient documentation

## 2024-06-11 DIAGNOSIS — F4310 Post-traumatic stress disorder, unspecified: Secondary | ICD-10-CM | POA: Diagnosis present

## 2024-06-11 DIAGNOSIS — F0393 Unspecified dementia, unspecified severity, with mood disturbance: Secondary | ICD-10-CM | POA: Diagnosis present

## 2024-06-11 DIAGNOSIS — R410 Disorientation, unspecified: Secondary | ICD-10-CM | POA: Diagnosis present

## 2024-06-11 DIAGNOSIS — E876 Hypokalemia: Secondary | ICD-10-CM | POA: Diagnosis not present

## 2024-06-11 DIAGNOSIS — N189 Chronic kidney disease, unspecified: Secondary | ICD-10-CM

## 2024-06-11 DIAGNOSIS — E119 Type 2 diabetes mellitus without complications: Secondary | ICD-10-CM | POA: Diagnosis not present

## 2024-06-11 DIAGNOSIS — A419 Sepsis, unspecified organism: Principal | ICD-10-CM | POA: Diagnosis present

## 2024-06-11 DIAGNOSIS — Z87891 Personal history of nicotine dependence: Secondary | ICD-10-CM

## 2024-06-11 DIAGNOSIS — Z8709 Personal history of other diseases of the respiratory system: Secondary | ICD-10-CM

## 2024-06-11 DIAGNOSIS — I5032 Chronic diastolic (congestive) heart failure: Secondary | ICD-10-CM | POA: Diagnosis present

## 2024-06-11 DIAGNOSIS — R1312 Dysphagia, oropharyngeal phase: Secondary | ICD-10-CM | POA: Diagnosis present

## 2024-06-11 DIAGNOSIS — Z8701 Personal history of pneumonia (recurrent): Secondary | ICD-10-CM

## 2024-06-11 DIAGNOSIS — Z860101 Personal history of adenomatous and serrated colon polyps: Secondary | ICD-10-CM

## 2024-06-11 DIAGNOSIS — Z833 Family history of diabetes mellitus: Secondary | ICD-10-CM

## 2024-06-11 DIAGNOSIS — Z7982 Long term (current) use of aspirin: Secondary | ICD-10-CM

## 2024-06-11 DIAGNOSIS — Z6835 Body mass index (BMI) 35.0-35.9, adult: Secondary | ICD-10-CM

## 2024-06-11 DIAGNOSIS — J9601 Acute respiratory failure with hypoxia: Secondary | ICD-10-CM | POA: Diagnosis present

## 2024-06-11 DIAGNOSIS — J9819 Other pulmonary collapse: Secondary | ICD-10-CM | POA: Diagnosis present

## 2024-06-11 DIAGNOSIS — Z8249 Family history of ischemic heart disease and other diseases of the circulatory system: Secondary | ICD-10-CM

## 2024-06-11 DIAGNOSIS — Z9189 Other specified personal risk factors, not elsewhere classified: Secondary | ICD-10-CM | POA: Diagnosis not present

## 2024-06-11 DIAGNOSIS — Z7189 Other specified counseling: Secondary | ICD-10-CM | POA: Diagnosis not present

## 2024-06-11 DIAGNOSIS — R6520 Severe sepsis without septic shock: Secondary | ICD-10-CM | POA: Diagnosis present

## 2024-06-11 DIAGNOSIS — Z818 Family history of other mental and behavioral disorders: Secondary | ICD-10-CM

## 2024-06-11 DIAGNOSIS — Z79899 Other long term (current) drug therapy: Secondary | ICD-10-CM

## 2024-06-11 DIAGNOSIS — Z823 Family history of stroke: Secondary | ICD-10-CM

## 2024-06-11 DIAGNOSIS — M1000 Idiopathic gout, unspecified site: Secondary | ICD-10-CM | POA: Diagnosis present

## 2024-06-11 DIAGNOSIS — E860 Dehydration: Secondary | ICD-10-CM | POA: Diagnosis present

## 2024-06-11 DIAGNOSIS — Z8679 Personal history of other diseases of the circulatory system: Secondary | ICD-10-CM

## 2024-06-11 DIAGNOSIS — E782 Mixed hyperlipidemia: Secondary | ICD-10-CM | POA: Diagnosis present

## 2024-06-11 LAB — COMPREHENSIVE METABOLIC PANEL WITH GFR
ALT: 15 U/L (ref 0–44)
AST: 26 U/L (ref 15–41)
Albumin: 2 g/dL — ABNORMAL LOW (ref 3.5–5.0)
Alkaline Phosphatase: 73 U/L (ref 38–126)
Anion gap: 10 (ref 5–15)
BUN: 37 mg/dL — ABNORMAL HIGH (ref 8–23)
CO2: 32 mmol/L (ref 22–32)
Calcium: 11 mg/dL — ABNORMAL HIGH (ref 8.9–10.3)
Chloride: 124 mmol/L — ABNORMAL HIGH (ref 98–111)
Creatinine, Ser: 1.52 mg/dL — ABNORMAL HIGH (ref 0.44–1.00)
GFR, Estimated: 36 mL/min — ABNORMAL LOW (ref >60)
Glucose, Bld: 92 mg/dL (ref 70–99)
Potassium: 2.9 mmol/L — ABNORMAL LOW (ref 3.5–5.1)
Sodium: 166 mmol/L (ref 135–145)
Total Bilirubin: 0.8 mg/dL (ref 0.0–1.2)
Total Protein: 5 g/dL — ABNORMAL LOW (ref 6.5–8.1)

## 2024-06-11 LAB — PROCALCITONIN: Procalcitonin: 0.36 ng/mL

## 2024-06-11 LAB — I-STAT ARTERIAL BLOOD GAS, ED
Acid-Base Excess: 3 mmol/L — ABNORMAL HIGH (ref 0.0–2.0)
Bicarbonate: 27 mmol/L (ref 20.0–28.0)
Calcium, Ion: 1.49 mmol/L — ABNORMAL HIGH (ref 1.15–1.40)
HCT: 25 % — ABNORMAL LOW (ref 36.0–46.0)
Hemoglobin: 8.5 g/dL — ABNORMAL LOW (ref 12.0–15.0)
O2 Saturation: 96 %
Patient temperature: 98.2
Potassium: 3.6 mmol/L (ref 3.5–5.1)
Sodium: 162 mmol/L (ref 135–145)
TCO2: 28 mmol/L (ref 22–32)
pCO2 arterial: 38.3 mmHg (ref 32–48)
pH, Arterial: 7.456 — ABNORMAL HIGH (ref 7.35–7.45)
pO2, Arterial: 74 mmHg — ABNORMAL LOW (ref 83–108)

## 2024-06-11 LAB — URINALYSIS, W/ REFLEX TO CULTURE (INFECTION SUSPECTED)
Bilirubin Urine: NEGATIVE
Glucose, UA: NEGATIVE mg/dL
Hgb urine dipstick: NEGATIVE
Ketones, ur: NEGATIVE mg/dL
Nitrite: NEGATIVE
Protein, ur: 100 mg/dL — AB
Specific Gravity, Urine: 1.017 (ref 1.005–1.030)
pH: 6 (ref 5.0–8.0)

## 2024-06-11 LAB — CBC WITH DIFFERENTIAL/PLATELET
Abs Immature Granulocytes: 0.11 K/uL — ABNORMAL HIGH (ref 0.00–0.07)
Basophils Absolute: 0 K/uL (ref 0.0–0.1)
Basophils Relative: 0 %
Eosinophils Absolute: 0 K/uL (ref 0.0–0.5)
Eosinophils Relative: 0 %
HCT: 35.6 % — ABNORMAL LOW (ref 36.0–46.0)
Hemoglobin: 10.7 g/dL — ABNORMAL LOW (ref 12.0–15.0)
Immature Granulocytes: 1 %
Lymphocytes Relative: 16 %
Lymphs Abs: 2 K/uL (ref 0.7–4.0)
MCH: 31.5 pg (ref 26.0–34.0)
MCHC: 30.1 g/dL (ref 30.0–36.0)
MCV: 104.7 fL — ABNORMAL HIGH (ref 80.0–100.0)
Monocytes Absolute: 0.7 K/uL (ref 0.1–1.0)
Monocytes Relative: 6 %
Neutro Abs: 9.9 K/uL — ABNORMAL HIGH (ref 1.7–7.7)
Neutrophils Relative %: 77 %
Platelets: 82 K/uL — ABNORMAL LOW (ref 150–400)
RBC: 3.4 MIL/uL — ABNORMAL LOW (ref 3.87–5.11)
RDW: 18.2 % — ABNORMAL HIGH (ref 11.5–15.5)
Smear Review: NORMAL
WBC: 12.7 K/uL — ABNORMAL HIGH (ref 4.0–10.5)
nRBC: 0 % (ref 0.0–0.2)

## 2024-06-11 LAB — RESP PANEL BY RT-PCR (RSV, FLU A&B, COVID)  RVPGX2
Influenza A by PCR: NEGATIVE
Influenza B by PCR: NEGATIVE
Resp Syncytial Virus by PCR: NEGATIVE
SARS Coronavirus 2 by RT PCR: NEGATIVE

## 2024-06-11 LAB — MAGNESIUM: Magnesium: 1.2 mg/dL — ABNORMAL LOW (ref 1.7–2.4)

## 2024-06-11 LAB — BASIC METABOLIC PANEL WITH GFR
Anion gap: 11 (ref 5–15)
BUN: 33 mg/dL — ABNORMAL HIGH (ref 8–23)
CO2: 28 mmol/L (ref 22–32)
Calcium: 9.9 mg/dL (ref 8.9–10.3)
Chloride: 121 mmol/L — ABNORMAL HIGH (ref 98–111)
Creatinine, Ser: 1.36 mg/dL — ABNORMAL HIGH (ref 0.44–1.00)
GFR, Estimated: 42 mL/min — ABNORMAL LOW (ref >60)
Glucose, Bld: 137 mg/dL — ABNORMAL HIGH (ref 70–99)
Potassium: 3.8 mmol/L (ref 3.5–5.1)
Sodium: 160 mmol/L — ABNORMAL HIGH (ref 135–145)

## 2024-06-11 LAB — I-STAT CG4 LACTIC ACID, ED
Lactic Acid, Venous: 2.9 mmol/L (ref 0.5–1.9)
Lactic Acid, Venous: 3.7 mmol/L (ref 0.5–1.9)
Lactic Acid, Venous: 2.1 mmol/L (ref 0.5–1.9)

## 2024-06-11 LAB — CBG MONITORING, ED: Glucose-Capillary: 119 mg/dL — ABNORMAL HIGH (ref 70–99)

## 2024-06-11 LAB — LACTIC ACID, PLASMA: Lactic Acid, Venous: 1.6 mmol/L (ref 0.5–1.9)

## 2024-06-11 LAB — STREP PNEUMONIAE URINARY ANTIGEN: Strep Pneumo Urinary Antigen: NEGATIVE

## 2024-06-11 LAB — VALPROIC ACID LEVEL: Valproic Acid Lvl: 111 ug/mL — ABNORMAL HIGH (ref 50–100)

## 2024-06-11 MED ORDER — SODIUM CHLORIDE 0.9 % IV SOLN: 3.0000 g | Freq: Four times a day (QID) | INTRAVENOUS | Status: DC

## 2024-06-11 MED ORDER — VANCOMYCIN HCL IN DEXTROSE 1-5 GM/200ML-% IV SOLN: 1000.0000 mg | Freq: Once | INTRAVENOUS | Status: DC

## 2024-06-11 MED ORDER — SODIUM CHLORIDE 0.9% FLUSH: 3.0000 mL | INTRAVENOUS | Status: DC | PRN

## 2024-06-11 MED ORDER — LACTATED RINGERS IV BOLUS
1000.0000 mL | Freq: Once | INTRAVENOUS | Status: AC
  Administered 2024-06-11: 1000 mL via INTRAVENOUS

## 2024-06-11 MED ORDER — POTASSIUM CHLORIDE 10 MEQ/100ML IV SOLN
10.0000 meq | INTRAVENOUS | Status: AC
  Administered 2024-06-11 (×3): 10 meq via INTRAVENOUS
  Filled 2024-06-11 (×3): qty 100

## 2024-06-11 MED ORDER — CARBAMIDE PEROXIDE 6.5 % OT SOLN: 3.0000 [drp] | OTIC | Status: DC

## 2024-06-11 MED ORDER — LACTATED RINGERS IV SOLN: INTRAVENOUS | Status: DC

## 2024-06-11 MED ORDER — TRIMETHOBENZAMIDE HCL 100 MG/ML IM SOLN: 200.0000 mg | Freq: Three times a day (TID) | INTRAMUSCULAR | Status: DC | PRN

## 2024-06-11 MED ORDER — VANCOMYCIN HCL 2000 MG/400ML IV SOLN
2000.0000 mg | Freq: Once | INTRAVENOUS | Status: AC
  Administered 2024-06-11: 2000 mg via INTRAVENOUS
  Filled 2024-06-11: qty 400

## 2024-06-11 MED ORDER — DEXTROSE 5 % IV SOLN: INTRAVENOUS | Status: DC

## 2024-06-11 MED ORDER — IOHEXOL 350 MG/ML SOLN
75.0000 mL | Freq: Once | INTRAVENOUS | Status: AC | PRN
  Administered 2024-06-11: 75 mL via INTRAVENOUS

## 2024-06-11 MED ORDER — INSULIN ASPART 100 UNIT/ML IJ SOLN: 0.0000 [IU] | INTRAMUSCULAR | Status: DC | PRN

## 2024-06-11 MED ORDER — DEXTROSE 50 % IV SOLN: 1.0000 | INTRAVENOUS | Status: DC | PRN

## 2024-06-11 MED ORDER — ACETAMINOPHEN 10 MG/ML IV SOLN: 1000.0000 mg | Freq: Four times a day (QID) | INTRAVENOUS | Status: AC | PRN

## 2024-06-11 MED ORDER — VANCOMYCIN HCL 750 MG/150ML IV SOLN
750.0000 mg | INTRAVENOUS | Status: DC
  Administered 2024-06-12: 750 mg via INTRAVENOUS
  Filled 2024-06-11: qty 150

## 2024-06-11 MED ORDER — IPRATROPIUM-ALBUTEROL 0.5-2.5 (3) MG/3ML IN SOLN: 3.0000 mL | RESPIRATORY_TRACT | Status: DC | PRN

## 2024-06-11 MED ORDER — ALBUMIN HUMAN 25 % IV SOLN
25.0000 g | INTRAVENOUS | Status: AC
  Administered 2024-06-11: 25 g via INTRAVENOUS
  Filled 2024-06-11: qty 100

## 2024-06-11 MED ORDER — SODIUM CHLORIDE 0.9 % IV SOLN: 250.0000 mL | INTRAVENOUS | Status: AC | PRN

## 2024-06-11 MED ORDER — INSULIN ASPART 100 UNIT/ML IJ SOLN
0.0000 [IU] | Freq: Four times a day (QID) | INTRAMUSCULAR | Status: DC | PRN
  Administered 2024-06-12 (×2): 1 [IU] via SUBCUTANEOUS
  Administered 2024-06-13: 2 [IU] via SUBCUTANEOUS
  Administered 2024-06-13 (×2): 1 [IU] via SUBCUTANEOUS
  Administered 2024-06-13: 2 [IU] via SUBCUTANEOUS

## 2024-06-11 MED ORDER — CHLORHEXIDINE GLUCONATE CLOTH 2 % EX PADS
6.0000 | MEDICATED_PAD | Freq: Every day | CUTANEOUS | Status: DC
  Administered 2024-06-13 – 2024-06-15 (×3): 6 via TOPICAL

## 2024-06-11 MED ORDER — SODIUM CHLORIDE 0.9 % IV SOLN
2.0000 g | Freq: Once | INTRAVENOUS | Status: AC
  Administered 2024-06-11: 2 g via INTRAVENOUS
  Filled 2024-06-11: qty 12.5

## 2024-06-11 MED ORDER — SODIUM CHLORIDE 0.9 % IV SOLN
3.0000 g | Freq: Four times a day (QID) | INTRAVENOUS | Status: DC
  Administered 2024-06-12 – 2024-06-14 (×12): 3 g via INTRAVENOUS
  Filled 2024-06-11 (×12): qty 8

## 2024-06-11 MED ORDER — INSULIN GLARGINE SOLOSTAR 100 UNIT/ML ~~LOC~~ SOPN: 10.0000 [IU] | PEN_INJECTOR | Freq: Every morning | SUBCUTANEOUS | Status: DC

## 2024-06-11 MED ORDER — STERILE WATER FOR INJECTION IJ SOLN
INTRAMUSCULAR | Status: AC
  Filled 2024-06-11: qty 10

## 2024-06-11 MED ORDER — INSULIN GLARGINE-YFGN 100 UNIT/ML ~~LOC~~ SOLN
10.0000 [IU] | Freq: Every evening | SUBCUTANEOUS | Status: DC
  Filled 2024-06-11: qty 0.1

## 2024-06-11 MED ORDER — IPRATROPIUM-ALBUTEROL 0.5-2.5 (3) MG/3ML IN SOLN
3.0000 mL | Freq: Four times a day (QID) | RESPIRATORY_TRACT | Status: DC
  Administered 2024-06-11 – 2024-06-12 (×2): 3 mL via RESPIRATORY_TRACT
  Filled 2024-06-11 (×3): qty 3

## 2024-06-11 MED ORDER — POTASSIUM CHLORIDE 10 MEQ/100ML IV SOLN
10.0000 meq | INTRAVENOUS | Status: DC
  Administered 2024-06-11: 10 meq via INTRAVENOUS
  Filled 2024-06-11: qty 100

## 2024-06-11 MED ORDER — POTASSIUM CHLORIDE CRYS ER 20 MEQ PO TBCR: 40.0000 meq | EXTENDED_RELEASE_TABLET | Freq: Once | ORAL | Status: DC

## 2024-06-11 MED ORDER — OLANZAPINE 10 MG IM SOLR
5.0000 mg | Freq: Once | INTRAMUSCULAR | Status: AC
  Administered 2024-06-11: 5 mg via INTRAMUSCULAR
  Filled 2024-06-11: qty 10

## 2024-06-11 MED ORDER — SODIUM CHLORIDE 0.9% FLUSH
3.0000 mL | Freq: Two times a day (BID) | INTRAVENOUS | Status: DC
  Administered 2024-06-13 – 2024-06-15 (×3): 3 mL via INTRAVENOUS

## 2024-06-11 NOTE — ED Provider Notes (Signed)
 Received signout on pending CT scans and likely admission for delirium/altered mental status for hypoxic respiratory failure/pneumonia/complicated UTI/hyponatremia.  Came agitated in the emergency department and required Zyprexa  to facilitate further workup.  Patient is talking currently, but nonsensical and cannot provide much history on my reexamination.  She is moving all extremities in a coordinated fashion.  Seems to be saturating well 2L [place]. Blood pressure is improved with IV fluids.  Lactate is normalizing. CT scans with pneumonia. Discussed with hospitalist for admission.    Neysa Caron PARAS, DO 06/11/24 2246

## 2024-06-11 NOTE — Sepsis Progress Note (Signed)
 Elink monitoring for the code sepsis protocol.

## 2024-06-11 NOTE — Sepsis Progress Note (Signed)
 2nd lactic acid increased, 3rd lactic acid ordered for 1856

## 2024-06-11 NOTE — ED Notes (Signed)
 Abnormal lactic results given to crystal b.rn by at

## 2024-06-11 NOTE — ED Notes (Signed)
 Patient transported to CT scan .

## 2024-06-11 NOTE — ED Triage Notes (Signed)
 Pt to ED via [facility] from [facility] EMS reports pt had hypoxia and altered mental status. Pt's 02 = 78% on 3L [place], pt wears 3L [place] at all times. Pt improved w/NRB 15L, 82%. Pt's labwork was awful per facility. Pt is usually able to talk and answer questions and is unable to do so today.  Pt awake, not responding on arrival.   92/56 Cbg 164

## 2024-06-11 NOTE — ED Notes (Signed)
 Admitting MD notified patient's family on patient's status and admission plan .

## 2024-06-11 NOTE — Progress Notes (Signed)
 Pharmacy Antibiotic Note  Claudia Keller is a 73 y.o. female for which pharmacy has been consulted for ampicillin -sulbactam and vancomycin  dosing for pneumonia.  Patient with a history of Dementia, HTN, hypothyroidism, HLD, GERD, CVA, AF, CKD, T2DM, pericardial effusion, HF, gout, depression, anxiety, bipolar, PTSD, COPD, obesity, IBS, GIB. Patient presenting with AMS.  SCr 1.52>>1.36 - ~1 baseline WBC 12.7; LA 2.9>1.6; T 99.8; HR 108; RR 18 COVID neg / flu neg  Cefepime  x 1 in ED 8/4  Plan: Unasyn  3g q6h Vancomycin  2000 mg once then 750 mg q24hr (eAUC 499.8; Scr 1.36; Vd 0.5; IBW) unless change in renal function Monitor WBC, fever, renal function, cultures De-escalate when able Levels at steady state F/u MRSA PCR  Height: 5' 4 (162.6 cm) Weight: 95 kg (209 lb 7 oz) IBW/kg (Calculated) : 54.7  Temp (24hrs), Avg:99.4 F (37.4 C), Min:98.9 F (37.2 C), Max:99.8 F (37.7 C)  Recent Labs  Lab 06/11/24 1450 06/11/24 1456 06/11/24 1716 06/11/24 2014 06/11/24 2215  WBC 12.7*  --   --   --   --   CREATININE 1.52*  --   --   --  1.36*  LATICACIDVEN  --  2.9* 3.7* 2.1*  --     Estimated Creatinine Clearance: 42.4 mL/min (A) (by C-G formula based on SCr of 1.36 mg/dL (H)).    No Known Allergies  Microbiology results: Pending  Thank you for allowing pharmacy to be a part of this patient's care.  Dorn Buttner, PharmD, BCPS 06/11/2024 10:52 PM ED Clinical Pharmacist -  [phone]

## 2024-06-11 NOTE — ED Provider Notes (Signed)
 [place] EMERGENCY DEPARTMENT AT [facility] Provider Note   CSN: [id number] Arrival date & time: 06/11/24  1408     Patient presents with: Altered Mental Status   Claudia Keller is a 73 y.o. female.    Altered Mental Status Patient brought in for mental status change.  Brought from [place] [facility].  Reviewed the paperwork from them.  Per EMS patient has been being treated for pneumonia.  Has some sort of lab abnormality that has worsened although they did not bring paperwork for that I do not have access to the blood work.  Also reportedly had sats in the 70s.  Reportedly sent in for failure to thrive .  Patient reportedly is at baseline able to talk.  Cannot at this time.  Potential report of hemiplegia at baseline.     Past Medical History:  Diagnosis Date   Acute respiratory disease due to COVID-19 virus 12/07/2020   AMS (altered mental status) 11/05/2021   11/04/2021 - 11/10/2021 hospitalized initially with metabolic encephalopathy in the context of hypoxic respiratory failure with acute on chronic CHF.  Pericardiocentesis performed for pericarditis related bloody fluid.  Altered mental status attributed to UTI clinically.  11/04/2021 cath urine revealed multiple species.  Full course of Rocephin  completed.     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 Authorizing Provider  acetaminophen  (TYLENOL ) 325 MG tablet Take 650 mg by mouth every 8 (eight) hours as needed for mild pain or moderate pain.    [provider]  albuterol  (VENTOLIN  HFA) 108 (90 Base) MCG/ACT inhaler Inhale 2 puffs into the lungs every 4 (four) hours as needed for wheezing or shortness of breath. 12/03/20   Maree, Pratik D, DO  allopurinol  (ZYLOPRIM ) 100 MG tablet Take 50 mg by mouth daily. Idiopathic gout    [provider]  aspirin  81 MG chewable tablet Chew 81 mg by mouth daily.    [provider]  bisacodyl (DULCOLAX) 10 MG suppository Place 10 mg rectally daily as needed (constipation not relieved by MOM).    [provider]  Calcium  Carb-Cholecalciferol  (OYSTER SHELL CALCIUM  W/D) 500-200 MG-UNIT TABS Take 1 tablet by mouth 2 (two) times daily. 11/10/20   [provider]  carbamide peroxide (DEBROX) 6.5 % OTIC solution Place 3 drops into both ears once a week. Every Monday    [provider]  colchicine  0.6 MG tablet Take 1 tablet (0.6 mg total) by mouth daily. 11/11/21 11/08/48  Laurita Pillion, MD  diltiazem  (DILACOR XR ) 180 MG 24 hr capsule Take 180 mg by mouth daily.    [provider]  divalproex  (DEPAKOTE  ER) 500 MG 24 hr tablet Take 2 tablets (1,000 mg total) by mouth at bedtime. 11/11/22   Patsy Lenis, MD  Dulaglutide (TRULICITY) 1.5 MG/0.5ML SOAJ Inject 1.5 mg into the skin once a week. Every Wednesday    [provider]  furosemide  (LASIX ) 20 MG tablet Take 1 tablet (20 mg total) by mouth every other day. 09/07/21   Johnson, Clanford L, MD  insulin  aspart (NOVOLOG ) 100 UNIT/ML injection Inject 0-10 Units into the skin in the morning, at  noon, in the evening, and at bedtime. BS 0-150 inject 0 units  BS 151-200 inject 2 units  BS 201-250 inject 3 units  BS 251-300 inject 5 units  BS 301-350 inject 7 units  BS 351- 400 inject 10 units  BS 401-999 call MD    [provider]  Insulin  Glargine Solostar (LANTUS ) 100 UNIT/ML Solostar Pen Inject 10 Units into the skin in the morning and at bedtime.    [provider]  ipratropium-albuterol  (DUONEB) 0.5-2.5 (3) MG/3ML SOLN Take 3 mLs by nebulization every 4 (four) hours as needed (SOB/wheezing).    [provider]  levothyroxine  (SYNTHROID ) 125 MCG tablet Take 125 mcg by mouth daily before breakfast.    [provider]  magnesium  hydroxide (MILK OF MAGNESIA) 400 MG/5ML suspension Take 30 mLs by mouth daily as needed (if no BM in 3 days).    [provider]  memantine  (NAMENDA ) 10 MG tablet Take 10 mg by mouth 2 (two) times daily.    [provider]  mirtazapine  (REMERON ) 15 MG tablet Take 15 mg by mouth at bedtime.    [provider]  Nutritional Supplements (NUTRITIONAL SUPPLEMENT PO) Take 1 each by mouth daily. Magic Cup    [provider]  ondansetron  (ZOFRAN ) 4 MG tablet Take 1 tablet (4 mg total) by mouth every 6 (six) hours as needed for nausea or vomiting. 10/02/23   Ruthell Lonni FALCON, PA-C  pantoprazole  (PROTONIX ) 40 MG tablet Take 1 tablet (40 mg total) by mouth 2 (two) times daily. Twice daily for 8 weeks then once daily 11/29/23   Darci Pore, MD  PARoxetine  (PAXIL ) 40 MG tablet Take 1 tablet (40 mg total) by mouth daily. 02/20/21   Hisada, Reina, MD  polyethylene glycol powder (GLYCOLAX /MIRALAX ) 17 GM/SCOOP powder Take 17 g by mouth daily.    [provider]  promethazine  (PHENERGAN ) 12.5 MG suppository Place 12.5 mg rectally every 6 (six) hours as needed for nausea or vomiting.    [provider]  QUEtiapine  (SEROQUEL  XR) 200 MG 24 hr tablet Take 200 mg by mouth at bedtime.     [provider]  rosuvastatin  (CRESTOR ) 40 MG tablet Take 1 tablet (40 mg total) by mouth daily. 01/12/22   Lucien Orren SAILOR, PA-C  saccharomyces boulardii (FLORASTOR) 250 MG capsule Take 250 mg by mouth daily.    [provider]  senna-docusate (SENOKOT-S) 8.6-50 MG tablet Take 1 tablet by mouth at bedtime as needed for mild constipation. 09/04/21   Johnson, Clanford L, MD  Sodium Phosphates (RA SALINE ENEMA RE) Place 1 Dose rectally once as needed (severe constipation). If no relief from both milk of magnesia, and Bisacodyl suppository, administer one dose of disposable saline enema.    [provider]  sucralfate  (CARAFATE ) 1 GM/10ML suspension Take 10 mLs (1 g total) by mouth 4 (four)  times daily. 11/29/23   Darci Pore, MD  vitamin B-12 (CYANOCOBALAMIN ) 500 MCG tablet Take 500 mcg by mouth daily.    [provider]    Allergies: Patient has no known allergies.    Review of Systems  Updated Vital Signs BP 102/80   Pulse 97   Temp 98.9 F (37.2 C) (Rectal)   Resp (!) 28   Ht 5' 4 (1.626 m)   Wt 95 kg   SpO2 100%   BMI 35.95 kg/m   Physical Exam Vitals and nursing note reviewed.  HENT:     Head: Atraumatic.  Abdominal:     Tenderness: There is no abdominal tenderness.  Musculoskeletal:     Right lower leg: Edema present.     Left lower leg: Edema present.     (all labs ordered are listed, but only abnormal results are displayed) Labs Reviewed  I-STAT CG4 LACTIC ACID, ED - Abnormal; Notable for the following components:      Result Value   Lactic Acid, Venous 2.9 (*)    All other components within normal limits  CULTURE, BLOOD (ROUTINE X 2)  CULTURE, BLOOD (ROUTINE X 2)  RESP PANEL BY RT-PCR (RSV, FLU A&B, COVID)  RVPGX2  COMPREHENSIVE METABOLIC PANEL WITH GFR  CBC WITH DIFFERENTIAL/PLATELET  URINALYSIS, W/ REFLEX TO CULTURE (INFECTION SUSPECTED)  VALPROIC ACID  LEVEL    EKG: EKG Interpretation Date/Time:  Monday June 11 2024 14:14:31 EDT Ventricular Rate:  99 PR Interval:  127 QRS Duration:  70 QT Interval:  375 QTC Calculation: 482 R Axis:   21  Text Interpretation: Sinus rhythm Abnormal R-wave progression, early transition Repol abnrm suggests ischemia, diffuse leads Confirmed by Patsey Lot [phone]) on 06/11/2024 2:59:16 PM  Radiology: [facility] Chest Portable 1 View Result Date: 06/11/2024 CLINICAL DATA:  Shortness of breath and altered mental status EXAM: PORTABLE CHEST 1 VIEW COMPARISON:  Chest radiograph dated 11/27/2023 FINDINGS: Low lung volumes with bronchovascular crowding. Bilateral patchy opacities, right-greater-than-left. Ring-like opacity in the right mid lung. No pleural effusion or pneumothorax. Enlarged cardiomediastinal silhouette is likely projectional. No acute osseous abnormality. IMPRESSION: 1. Bilateral patchy opacities, right-greater-than-left, may represent atelectasis, aspiration, or pneumonia. 2. Ring-like opacity in the right mid lung, which may be artifactual related to overlying support apparatus or represent a cavitary lesion. Consider repeat chest radiograph with removal of overlying devices. Electronically Signed   By: Limin  Xu M.D.   On: 06/11/2024 15:03     Procedures   Medications Ordered in the ED  lactated ringers  infusion ( Intravenous New Bag/Given 06/11/24 1505)  ceFEPIme  (MAXIPIME ) 2 g in sodium chloride  0.9 % 100 mL IVPB (2 g Intravenous New Bag/Given 06/11/24 1507)  vancomycin  (VANCOREADY) IVPB 2000 mg/400 mL (has no administration in time range)  lactated ringers  bolus 1,000 mL (1,000 mLs Intravenous New Bag/Given 06/11/24 1506)                                    Medical Decision Making Amount and/or Complexity of Data Reviewed Labs: ordered. Radiology: ordered.  Risk Prescription drug management.   Patient brought in for mental status change.  Reportedly lab abnormalities at nursing home but not sent in with paperwork with the labs displayed.  However is  hypoxic.  Recently treated for pneumonia with doxycycline .  Differential diagnosis with mental status change and hypoxia does include pneumonia particular with x-ray showing likely infiltrate.  However also has cloudy  urine for possible UTI.  Initial blood pressure little low but is improved.  Fluid bolus given.  Antibiotics given to cover pneumonia and potential complicated UTI.  Cefepime  and Vanco given.  Initial lactic acid mildly elevated 2.9.  With improvement in blood pressure initial bolus of just 1 L given.  Will get CT chest abdomen pelvis to evaluate for cause of sepsis including with the x-ray abnormality showed potential necrotizing pneumonia.  Care turned over to Dr. Neysa.  CRITICAL CARE Performed by: [place] Total critical care time: 30 minutes Critical care time was exclusive of separately billable procedures and treating other patients. Critical care was necessary to treat or prevent imminent or life-threatening deterioration. Critical care was time spent personally by me on the following activities: development of treatment plan with patient and/or surrogate as well as nursing, discussions with consultants, evaluation of patient's response to treatment, examination of patient, obtaining history from patient or surrogate, ordering and performing treatments and interventions, ordering and review of laboratory studies, ordering and review of radiographic studies, pulse oximetry and re-evaluation of patient's condition.      Final diagnoses:  None    ED Discharge Orders     None          River Rankin, MD 06/11/24 [phone]

## 2024-06-11 NOTE — H&P (Addendum)
 History and Physical    Claudia Keller FMW:[id number] DOB: March 01, 1951 DOA: 06/11/2024  PCP: Iver Elsie HERO, MD   Patient coming from: ALF   Chief Complaint:  Chief Complaint  Patient presents with   Altered Mental Status   ED TRIAGE note:  Pt to ED via [facility] from [facility]. EMS reports pt had hypoxia and altered mental status. Pt's 02 = 78% on 3L [place], pt wears 3L [place] at all times. Pt improved w/NRB 15L, 82%. Pt's labwork was awful per facility. Pt is usually able to talk and answer questions and is unable to do so today.  Pt awake, not responding on arrival.    92/56 Cbg 164      HPI:  [name] is a 73 y.o. female with medical history significant of history of CVA with residual hemiplegia, dementia, essential hypertension, hypothyroidism, hyperlipidemia, GERD, CVA, atrial fibrillation, CKD stage III, DM type II, pericardial effusion, congestive heart failure, gout, depression, anxiety, bipolar disorder, PTSD, COPD, morbid obesity, IBS, history of GI bleed in January 2025 not on anticoagulation anymore and prolonged QTc interval presenting to emergency department via EMS from nursing home as nursing home stating that patient is not responding as compared to her baseline.  Per EMS patient has been being treated for pneumonia.  Has some sort of lab abnormality that has worsened although they did not bring paperwork for that I do not have access to the blood work.  Also reportedly had sats in the 70s.  Reportedly sent in for failure to thrive .  Patient reportedly is at baseline able to talk.  Cannot at this time.     During my evaluation at the bedside patient is alert however confused.  Unable to obtain any history from the patient.  Unable to follow commands. Updated patient's son over phone who is available at [phone].  Also verified patient is full code.  ED Course:  Presentation to ED patient requiring Ventimask O2 sat 100% 6 L oxygen.  Initially  hypotensive, tachycardic and tachypneic blood pressure has been improved with 2 L of LR bolus and currently on maintenance fluid LR 50 cc/h. CMP showed high sodium 166, low potassium 2.9, elevated creatinine 1.52, elevated calcium  11, low albumin  2. CBC showing leukocytosis, stable H&H and low platelet count. Initial lactic acid 3.7 has been improved 2.1 after fluid resuscitation. UA showing evidence of UTI.  Pending blood culture and urine culture.  Chest x-ray show evidence for bilateral pneumonia.  And concern for cavitary lesion. CT chest abdomen pelvis showing collapse and consolidation posterior segment of the right upper lobe and both lower lobe likely combination of aspiration and pneumonia.  Trace pleural effusion.Small bilateral renal stones with layering stone debris in the bladder.4. Aortic atherosclerosis (ICD10-I70.0). Coronary artery calcification. 5. CT head dictated separately.  CT head no acute intracranial abnormality.  In the ED received cefepime , vancomycin , 2 L of LR bolus currently on maintenance therapy LR 150 cc/h, cefepime , vancomycin , oral KCl 40? In the ED patient become more alert after giving the 2 L of LR bolus and becomes suddenly very agitated required Zyprexa  before the imaging.  Currently patient is having waxing and waning mental status.  More drowsy and sleepy.  Insulin   Hospitalist has been consulted for further evaluation management of sepsis in the setting of pneumonia and UTI, altered mental status in the setting of hypernatremia, hypernatremia, hypokalemia, acute kidney injury, hypercalcemia, thrombocytopenia, and aspiration pneumonia.    Significant labs in the ED: Lab  Orders         Culture, blood (routine x 2)         Resp panel by RT-PCR (RSV, Flu A&B, Covid) Anterior Nasal Swab         Urine Culture         Expectorated Sputum Assessment w Gram Stain, Rflx to Resp Cult         Comprehensive metabolic panel         CBC with Differential          Urinalysis, w/ Reflex to Culture (Infection Suspected) -Urine, Catheterized         Valproic acid  level         Basic metabolic panel         Magnesium          Lactic acid, plasma         Blood gas, arterial         Hemoglobin A1c         Procalcitonin         Strep pneumoniae urinary antigen         Legionella Pneumophila Serogp 1 Ur Ag         Basic metabolic panel         I-Stat Lactic Acid         CBG monitoring, ED       Review of Systems:  Review of Systems  Unable to perform ROS: Mental status change    Past Medical History:  Diagnosis Date   Acute respiratory disease due to COVID-19 virus 12/07/2020   AMS (altered mental status) 11/05/2021   11/04/2021 - 11/10/2021 hospitalized initially with metabolic encephalopathy in the context of hypoxic respiratory failure with acute on chronic CHF.  Pericardiocentesis performed for pericarditis related bloody fluid.  Altered mental status attributed to UTI clinically.  11/04/2021 cath urine revealed multiple species.  Full course of Rocephin  completed.     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   Hypothyroidism    IBS (irritable bowel syndrome)    Ketoacidosis    Metabolic encephalopathy 11/09/2021   Mixed hyperlipidemia    PTSD (post-traumatic stress disorder)    S/P colonoscopy 11/2009   RMR: pancolonic diverticula, polypoid rectal mucosa with  prominent lymphoid aggregates, repeat in Jan 2016    Stress incontinence, female    Tobacco use    Type 2 diabetes mellitus (HCC)    Urinary tract infection 11/05/2021    Past Surgical History:  Procedure  Laterality Date   Bilateral tubal ligation     BIOPSY  11/28/2023   Procedure: BIOPSY;  Surgeon: Charlanne Groom, MD;  Location: [facility] ENDOSCOPY;  Service: Gastroenterology;;   BREAST BIOPSY Left 10/16/2014   negative   CHOLECYSTECTOMY  09/11/2012   Procedure: LAPAROSCOPIC CHOLECYSTECTOMY;  Surgeon: Oneil DELENA Budge, MD;  Location: [facility] ORS;  Service: General;  Laterality: N/A;   COLONOSCOPY  11/17/2009   Dr. Shaaron: normal rectum, pancolonic diverticula, polyps benign, no adenomas.    COLONOSCOPY N/A 02/12/2015   Dr. Shaaron: colonic diverticulosis,  multiple colonic polyps (tubular adenomas), negative segmental biopsies. Surveillance in 2021   ESOPHAGOGASTRODUODENOSCOPY  11/23/2011   Dr. Shaaron: Erosive reflux esophagitis; small hiatal hernia; esophagus dilated empirically   ESOPHAGOGASTRODUODENOSCOPY (EGD) WITH PROPOFOL  N/A 11/28/2023   Procedure: ESOPHAGOGASTRODUODENOSCOPY (EGD) WITH PROPOFOL ;  Surgeon: Charlanne Groom, MD;  Location: [facility] ENDOSCOPY;  Service: Gastroenterology;  Laterality: N/A;   MALONEY DILATION  11/23/2011   Procedure: AGAPITO DILATION;  Surgeon: Lamar CHRISTELLA Shaaron, MD;  Location: [facility] ENDO SUITE;  Service: Endoscopy;  Laterality: N/A;   PERICARDIOCENTESIS N/A 11/06/2021   Procedure: PERICARDIOCENTESIS;  Surgeon: Dann Candyce RAMAN, MD;  Location: [facility] INVASIVE CV LAB;  Service: Cardiovascular;  Laterality: N/A;   SKIN LESION EXCISION     on buttocks   TUBAL LIGATION       reports that she has been smoking cigarettes. She has a 86 pack-year smoking history. She has never used smokeless tobacco. She reports that she does not drink alcohol  and does not use drugs.  No Known Allergies  Family History  Problem Relation Age of Onset   Cancer Mother        unsure what type   Heart disease Father    Heart attack Father    Schizophrenia Brother    Stroke Brother    Cancer Son        bladder   Coronary artery disease Other    Diabetes type II Other    Colon cancer Neg Hx    Breast cancer Neg  Hx     Prior to Admission medications   Medication Sig Start Date End Date Taking? Authorizing Provider  acetaminophen  (TYLENOL ) 325 MG tablet Take 650 mg by mouth every 8 (eight) hours as needed for mild pain or moderate pain.    [provider]  albuterol  (VENTOLIN  HFA) 108 (90 Base) MCG/ACT inhaler Inhale 2 puffs into the lungs every 4 (four) hours as needed for wheezing or shortness of breath. 12/03/20   Maree, Pratik D, DO  allopurinol  (ZYLOPRIM ) 100 MG tablet Take 50 mg by mouth daily. Idiopathic gout    [provider]  aspirin  81 MG chewable tablet Chew 81 mg by mouth daily.    [provider]  bisacodyl (DULCOLAX) 10 MG suppository Place 10 mg rectally daily as needed (constipation not relieved by MOM).    [provider]  Calcium  Carb-Cholecalciferol  (OYSTER SHELL CALCIUM  W/D) 500-200 MG-UNIT TABS Take 1 tablet by mouth 2 (two) times daily. 11/10/20   [provider]  carbamide peroxide (DEBROX) 6.5 % OTIC solution Place 3 drops into both ears once a week. Every Monday    [provider]  colchicine  0.6 MG tablet Take 1 tablet (0.6 mg total) by mouth daily. 11/11/21 11/08/48  Laurita Pillion, MD  diltiazem  (DILACOR XR ) 180 MG 24 hr capsule Take 180 mg by mouth daily.    [provider]  divalproex  (DEPAKOTE  ER) 500 MG 24 hr tablet Take 2 tablets (1,000 mg total) by mouth at bedtime. 11/11/22   Patsy Lenis, MD  Dulaglutide (TRULICITY) 1.5 MG/0.5ML SOAJ Inject 1.5 mg into the skin once a week. Every Wednesday    [provider]  furosemide  (LASIX ) 20 MG tablet Take 1 tablet (20 mg total) by mouth every other day. 09/07/21   Johnson, Clanford L, MD  insulin  aspart (NOVOLOG ) 100 UNIT/ML injection Inject 0-10 Units into the skin in the morning, at noon, in the evening, and at bedtime. BS 0-150 inject 0 units  BS 151-200 inject 2 units  BS 201-250 inject 3 units  BS 251-300 inject 5 units  BS 301-350 inject 7 units  BS 351- 400  inject 10 units  BS 401-999 call MD    [provider]  Insulin  Glargine Solostar (LANTUS ) 100 UNIT/ML Solostar Pen Inject 10 Units into the skin in the morning and at bedtime.    [provider]  ipratropium-albuterol  (DUONEB) 0.5-2.5 (3) MG/3ML SOLN Take 3 mLs by nebulization every 4 (four) hours as needed (SOB/wheezing).    [provider]  levothyroxine  (SYNTHROID ) 125 MCG tablet Take 125 mcg by mouth daily before breakfast.    [provider]  magnesium  hydroxide (MILK OF MAGNESIA) 400 MG/5ML suspension Take 30 mLs by mouth daily as needed (if no BM in 3 days).    [provider]  memantine  (NAMENDA ) 10 MG tablet Take 10 mg by mouth 2 (two) times daily.    [provider]  mirtazapine  (REMERON ) 15 MG tablet Take 15 mg by mouth at bedtime.    [provider]  Nutritional Supplements (NUTRITIONAL SUPPLEMENT PO) Take 1 each by mouth daily. Manufacturing engineer, Historical, MD  ondansetron  (ZOFRAN ) 4 MG tablet Take 1 tablet (4 mg total) by mouth every 6 (six) hours as needed for nausea or vomiting. 10/02/23   Ruthell Lonni FALCON, PA-C  pantoprazole  (PROTONIX ) 40 MG tablet Take 1 tablet (40 mg total) by mouth 2 (two) times daily. Twice daily for 8 weeks then once daily 11/29/23   Darci Pore, MD  PARoxetine  (PAXIL ) 40 MG tablet Take 1 tablet (40 mg total) by mouth daily. 02/20/21   Hisada, Reina, MD  polyethylene glycol powder (GLYCOLAX /MIRALAX ) 17 GM/SCOOP powder Take 17 g by mouth daily.    [provider]  promethazine  (PHENERGAN ) 12.5 MG suppository Place 12.5 mg rectally every 6 (six) hours as needed for nausea or vomiting.    [provider]  QUEtiapine  (SEROQUEL  XR) 200 MG 24 hr tablet Take 200 mg by mouth at bedtime.    [provider]  rosuvastatin  (CRESTOR ) 40 MG tablet Take 1 tablet (40 mg total) by mouth daily. 01/12/22   Lucien Orren SAILOR, PA-C  saccharomyces boulardii (FLORASTOR) 250 MG  capsule Take 250 mg by mouth daily.    [provider]  senna-docusate (SENOKOT-S) 8.6-50 MG tablet Take 1 tablet by mouth at bedtime as needed for mild constipation. 09/04/21   Johnson, Clanford L, MD  Sodium Phosphates (RA SALINE ENEMA RE) Place 1 Dose rectally once as needed (severe constipation). If no relief from both milk of magnesia, and Bisacodyl suppository, administer one dose of disposable saline enema.    [provider]  sucralfate  (CARAFATE ) 1 GM/10ML suspension Take 10 mLs (1 g total) by mouth 4 (four) times daily. 11/29/23   Darci Pore, MD  vitamin B-12 (CYANOCOBALAMIN ) 500 MCG tablet Take 500 mcg by mouth daily.    [provider]     Physical Exam: Vitals:   06/11/24 2200 06/11/24 2215 06/11/24 2245 06/11/24 2259  BP: (!) 130/55 (!) 127/106 109/66   Pulse: (!) 103 (!) 105 (!) 108   Resp: 18 20 18    Temp:    98.2 F (36.8 C)  TempSrc:    Temporal  SpO2: 100% 100% 100%   Weight:      Height:        Physical Exam Vitals and nursing note reviewed.  Constitutional:      General: She is not in acute distress.    Appearance:  She is ill-appearing.     Comments: Alert but not oriented.  HENT:     Mouth/Throat:     Mouth: Mucous membranes are dry.  Eyes:     Pupils: Pupils are equal, round, and reactive to light.  Cardiovascular:     Rate and Rhythm: Regular rhythm. Tachycardia present.     Pulses: Normal pulses.     Heart sounds: Normal heart sounds.  Pulmonary:     Effort: Pulmonary effort is normal.     Breath sounds: Normal breath sounds. No wheezing.  Abdominal:     Palpations: Abdomen is soft.  Musculoskeletal:     Cervical back: Neck supple.     Right lower leg: No edema.     Left lower leg: No edema.  Skin:    General: Skin is dry.     Capillary Refill: Capillary refill takes less than 2 seconds.  Neurological:     Mental Status: She is alert.     Comments: Patient is alert and confused not oriented. Intermittent  shakiness of both upper extremities.  Psychiatric:     Comments: Unable to assess      Labs on Admission: I have personally reviewed following labs and imaging studies  CBC: Recent Labs  Lab 06/11/24 1450  WBC 12.7*  NEUTROABS 9.9*  HGB 10.7*  HCT 35.6*  MCV 104.7*  PLT 82*   Basic Metabolic Panel: Recent Labs  Lab 06/11/24 1450 06/11/24 2215  NA 166* 160*  K 2.9* 3.8  CL 124* 121*  CO2 32 28  GLUCOSE 92 137*  BUN 37* 33*  CREATININE 1.52* 1.36*  CALCIUM  11.0* 9.9  MG  --  1.2*   GFR: Estimated Creatinine Clearance: 42.4 mL/min (A) (by C-G formula based on SCr of 1.36 mg/dL (H)). Liver Function Tests: Recent Labs  Lab 06/11/24 1450  AST 26  ALT 15  ALKPHOS 73  BILITOT 0.8  PROT 5.0*  ALBUMIN  2.0*   No results for input(s): LIPASE, AMYLASE in the last 168 hours. No results for input(s): AMMONIA in the last 168 hours. Coagulation Profile: No results for input(s): INR, PROTIME in the last 168 hours. Cardiac Enzymes: No results for input(s): CKTOTAL, CKMB, CKMBINDEX, TROPONINI, TROPONINIHS in the last 168 hours. BNP (last 3 results) No results for input(s): BNP in the last 8760 hours. HbA1C: No results for input(s): HGBA1C in the last 72 hours. CBG: Recent Labs  Lab 06/11/24 2309  GLUCAP 119*   Lipid Profile: No results for input(s): CHOL, HDL, LDLCALC, TRIG, CHOLHDL, LDLDIRECT in the last 72 hours. Thyroid  Function Tests: No results for input(s): TSH, T4TOTAL, FREET4, T3FREE, THYROIDAB in the last 72 hours. Anemia Panel: No results for input(s): VITAMINB12, FOLATE, FERRITIN, TIBC, IRON, RETICCTPCT in the last 72 hours. Urine analysis:    Component Value Date/Time   COLORURINE AMBER (A) 06/11/2024 1506   APPEARANCEUR CLOUDY (A) 06/11/2024 1506   LABSPEC 1.017 06/11/2024 1506   PHURINE 6.0 06/11/2024 1506   GLUCOSEU NEGATIVE 06/11/2024 1506   HGBUR NEGATIVE 06/11/2024 1506   BILIRUBINUR  NEGATIVE 06/11/2024 1506   KETONESUR NEGATIVE 06/11/2024 1506   PROTEINUR 100 (A) 06/11/2024 1506   UROBILINOGEN 1.0 01/10/2015 1754   NITRITE NEGATIVE 06/11/2024 1506   LEUKOCYTESUR TRACE (A) 06/11/2024 1506    Radiological Exams on Admission: I have personally reviewed images CT HEAD WO CONTRAST ( ) Result Date: 06/11/2024 CLINICAL DATA:  Mental status change. EXAM: CT HEAD WITHOUT CONTRAST TECHNIQUE: Contiguous axial images were obtained from the base of  the skull through the vertex without intravenous contrast. RADIATION DOSE REDUCTION: This exam was performed according to the departmental dose-optimization program which includes automated exposure control, adjustment of the mA and/or kV according to patient size and/or use of iterative reconstruction technique. COMPARISON:  Head CT 03/06/2023 FINDINGS: Brain: No intracranial hemorrhage, mass effect, or midline shift. Stable degree of atrophy and chronic small vessel ischemia. No hydrocephalus. The basilar cisterns are patent. No evidence of territorial infarct or acute ischemia. No extra-axial or intracranial fluid collection. Vascular: Atherosclerosis of skullbase vasculature without hyperdense vessel or abnormal calcification. Skull: No fracture or focal lesion. Sinuses/Orbits: No acute findings. Other: None. IMPRESSION: 1. No acute intracranial abnormality. 2. Stable atrophy and chronic small vessel ischemia. Electronically Signed   By: Andrea Gasman M.D.   On: 06/11/2024 22:01   CT CHEST ABDOMEN PELVIS W CONTRAST Result Date: 06/11/2024 CLINICAL DATA:  Sepsis. EXAM: CT CHEST, ABDOMEN, AND PELVIS WITH CONTRAST TECHNIQUE: Multidetector CT imaging of the chest, abdomen and pelvis was performed following the standard protocol during bolus administration of intravenous contrast. RADIATION DOSE REDUCTION: This exam was performed according to the departmental dose-optimization program which includes automated exposure control, adjustment of the mA  and/or kV according to patient size and/or use of iterative reconstruction technique. CONTRAST:  75mL OMNIPAQUE  IOHEXOL  350 MG/ML SOLN COMPARISON:  CT abdomen pelvis 11/27/2023 and CT chest 11/09/2022. FINDINGS: CT CHEST FINDINGS Cardiovascular: Atherosclerotic calcification of the aorta, aortic valve and coronary arteries. Heart is enlarged. No pericardial effusion. Mediastinum/Nodes: No pathologically enlarged mediastinal, hilar or axillary lymph nodes. Air in the esophagus can be seen with dysmotility. Lungs/Pleura: Collapse/consolidation in the posterior segment right upper lobe and both lower lobes. Scattered ground-glass and mild septal thickening in the non dependent portions of both lungs. Trace left pleural fluid. Airway is unremarkable. Musculoskeletal: Degenerative changes in the spine. CT ABDOMEN PELVIS FINDINGS Hepatobiliary: Liver is unremarkable. Cholecystectomy. No biliary ductal dilatation. Pancreas: Negative. Spleen: Negative. Adrenals/Urinary Tract: Adrenal glands are unremarkable. Small bilateral renal stones. Subcentimeter low-attenuation lesions in the left kidney, too small to characterize. No specific follow-up necessary. Ureters are decompressed. High attenuation layers along the posterior bladder wall, new from 11/27/2023. Stomach/Bowel: Stomach, small bowel, appendix and colon are unremarkable. Moderate stool burden. Vascular/Lymphatic: Atherosclerotic calcification of the aorta. No pathologically enlarged lymph nodes. Reproductive: Uterus is visualized.  No adnexal mass. Other: No free fluid.  Mesenteries and peritoneum are unremarkable. Musculoskeletal: Osteopenia.  Degenerative changes in the spine. IMPRESSION: 1. Collapse/consolidation in the posterior segment right upper lobe and both lower lobes, likely a combination aspiration and pneumonia. 2. Trace left pleural fluid. 3. Small bilateral renal stones with layering stone debris in the bladder. 4. Aortic atherosclerosis  (ICD10-I70.0). Coronary artery calcification. 5. CT head dictated separately. Electronically Signed   By: Newell Eke M.D.   On: 06/11/2024 21:44   DG Chest Portable 1 View Result Date: 06/11/2024 CLINICAL DATA:  PICC placement. EXAM: PORTABLE CHEST 1 VIEW COMPARISON:  Radiograph earlier today FINDINGS: No central line is seen. There multiple overlying artifacts. The previous ring like opacity projecting over the right lung is not seen and was likely external to the patient. Central vascular engorgement with bilateral ill-defined perihilar opacities. No pneumothorax or large pleural effusion. Heart is normal in size. Aortic atherosclerosis. IMPRESSION: 1. No central line or PICC is seen. 2. Central vascular engorgement with bilateral ill-defined perihilar opacities. This may represent pulmonary edema or atelectasis. Electronically Signed   By: Andrea Gasman M.D.   On: 06/11/2024 19:57  DG Chest Portable 1 View Result Date: 06/11/2024 CLINICAL DATA:  Shortness of breath and altered mental status EXAM: PORTABLE CHEST 1 VIEW COMPARISON:  Chest radiograph dated 11/27/2023 FINDINGS: Low lung volumes with bronchovascular crowding. Bilateral patchy opacities, right-greater-than-left. Ring-like opacity in the right mid lung. No pleural effusion or pneumothorax. Enlarged cardiomediastinal silhouette is likely projectional. No acute osseous abnormality. IMPRESSION: 1. Bilateral patchy opacities, right-greater-than-left, may represent atelectasis, aspiration, or pneumonia. 2. Ring-like opacity in the right mid lung, which may be artifactual related to overlying support apparatus or represent a cavitary lesion. Consider repeat chest radiograph with removal of overlying devices. Electronically Signed   By: Limin  Xu M.D.   On: 06/11/2024 15:03     EKG: My personal interpretation of EKG shows:  Normal sinus rhythm heart rate 99.  There is no history of abnormality.    Assessment/Plan: Principal Problem:    Sepsis secondary to aspiration pneumonia and UTI (HCC) Active Problems:   Insulin  dependent type 2 diabetes mellitus (HCC)   Acute metabolic encephalopathy   Hypernatremia   Paroxysmal atrial fibrillation (HCC)   PTSD (post-traumatic stress disorder)   Acute kidney injury superimposed on CKD (HCC)   Acute hypoxic respiratory failure (HCC)   GERD (gastroesophageal reflux disease)   Anxiety   History of CVA (cerebrovascular accident)   Dementia (HCC)   History of pericardial effusion   Bipolar disorder (HCC)   Hypokalemia   Hyperlipidemia   Hypercalcemia   History of dementia   Chronic diastolic CHF (congestive heart failure) (HCC)   Depression   History of COPD   History of GI bleed   Thrombocytopenia (HCC)   Acute cystitis    Assessment and Plan: Sepsis secondary to aspiration pneumonia and UTI -Patient presented to emergency department from nursing home for evaluation for altered mental status and acute hypoxic respiratory failure.  In nursing home patient is more confused not talking not eating any food for last 1 day.  Also O2 sat dropped to 87% on room air at nursing home.  History of COPD but not oxygen dependent - At presentation to ED patient found to have altered mental status, hypotensive, tachycardic, tachypneic and required Ventimask/nonrebreather oxygen saturation improved currently on nasal cannula oxygen. -CMP showed high sodium 166, low potassium 2.9, elevated creatinine 1.52, elevated calcium  11, low albumin  2. CBC showing leukocytosis, stable H&H and low platelet count. Initial lactic acid 3.7 has been improved 2.1 after fluid resuscitation. UA showing evidence of UTI.  Pending blood culture and urine culture.  Pending sputum culture, urine Legionella antigen, urine strep antigen and procalcitonin level. -Chest x-ray show evidence for bilateral pneumonia.  And concern for cavitary lesion. -CT chest abdomen pelvis showing collapse and consolidation posterior segment  of the right upper lobe and both lower lobe likely combination of aspiration and pneumonia.  Trace pleural effusion. -CT head no acute intracranial abnormality. -In the ED received cefepime , vancomycin , 2 L of LR bolus currently on maintenance therapy LR 150 cc/h, cefepime , vancomycin . In the ED patient become more alert after giving the 2 L of LR bolus and becomes suddenly very agitated required Zyprexa  before the imaging.  Currently patient is having waxing and waning mental status.  -Sepsis in the setting of aspiration pneumonia and UTI - Continue IV vancomycin  and IV Unasyn . - Repeat BMP showing serum sodium has been improved 166-160.  Continue maintenance fluid LR decreasing rate 150 cc/h to 125 cc/h.  Patient maintaining blood pressure well MAP around 71-78. Giving albumin  25 g. -Continue  to trend sodium level, lactic acid and follow-up with culture results. -Updated patient's son over phone.  Also verified with patient's son that patient wanted to be FILL code.  Acute hypoxic respiratory failure History of COPD -O2 sat dropped 67% on room air.  Acute hypoxic respiratory failure in the setting of pneumonia, consolidation and lung collapse. - Continue DuoNeb every 4 hours. - Continue to treat for aspiration pneumonia. - Once patient will be more alert and oriented we will able to do pulmonary toiletry and chest PT.  Hypernatremia-improving -Sodium 166 which has been improved to 160 over the course of 7 hours.  Patient received 2 L of LR bolus and was on LR 150 cc/h. -As sodium level already has been improving with LR holding of D5W infusion.  Decreasing LR to 125 cc per hour. -Continue bmp every 4 hours. -Goal to improve sodium 8 to 10 meq over the course of next 24 hours by 2 p.m. 8/5. -If sodium corrects very quickly will start D5W infusion.  Hypokalemia-resolved -Low potassium 2.9 and repeat potassium 3.8.  Continue to monitor  Hypercalcemia-resolved -Elevated calcium  11 has been  improved to 9.9 with IV fluid resuscitation.  Acute kidney injury on superimposed CKD stage IIIa -Elevated creatinine 1.52.  Prerenal acute kidney injury in the setting of sepsis.  Creatinine level has been improving.  Acute metabolic encephalopathy -Altered mental status/confusion and agitation.  Patient is unable to follow command.  Patient is alert but not oriented at all.  History of dementia at nursing home stating that at baseline she talks but for last 1 day patient has not talking or neither having any food or oral intake. - Acute metabolic encephalopathy multifactorial-sepsis, acute hypoxic respiratory failure, hypernatremia, hypokalemia and hypercalcemia. -Continue monitor improvement of mental status. -Due to altered mental status status unable to give any diet and oral medications.   Thrombocytopenia -Low platelet count 82.  Patient platelet count was 148 6 months ago.  Acute on chronic thrombocytopenia in the setting of sepsis.  Continue monitor development of any bleeding.  Paroxysmal atrial fibrillation -Normal sinus rhythm heart rate 99.  There is no history of abnormality. -Holding oral medications in the setting of altered mental status. -History of previous GI bleed not on anticoagulation anymore.   History of GI bleed  Anxiety, depression, PTSD Dementia -Due to altered mental status all medications on hold.  History of pericardial effusion History of pericardial effusion.  Previous echo from 01/2022 showed trace pericardial effusion without any evidence of tamponade.  Preserved EF 60 to 65%. - Obtaining repeat echocardiogram to assess for pericardial effusion.   History of CVA - Oral medication on hold   DVT prophylaxis:  SCDs Code Status:  Full Code.  Verified CODE STATUS with patient's son over phone Mr. Garen Hoard who stated that continue all aggressive management.  Patient is stated that mother wish that Diet: N.p.o. as risk for aspiration and oral  medication on hold as well.  Resume all home medications once appropriate available to swallow. Family Communication: Family is available over phone.Opportunity was given to ask question and all questions were answered satisfactorily.  Disposition Plan: Continue monitor improvement of mental status.  Pending culture results.  Monitor serum sodium level. Consults: None indicated at this time Admission status:   Inpatient, Telemetry bed  Severity of Illness: The appropriate patient status for this patient is INPATIENT. Inpatient status is judged to be reasonable and necessary in order to provide the required intensity of service to ensure the  patient's safety. The patient's presenting symptoms, physical exam findings, and initial radiographic and laboratory data in the context of their chronic comorbidities is felt to place them at high risk for further clinical deterioration. Furthermore, it is not anticipated that the patient will be medically stable for discharge from the hospital within 2 midnights of admission.   * I certify that at the point of admission it is my clinical judgment that the patient will require inpatient hospital care spanning beyond 2 midnights from the point of admission due to high intensity of service, high risk for further deterioration and high frequency of surveillance required.DEWAINE    Shishir Krantz, MD [facility]  How to contact the [facility] Attending or Consulting provider 7A - 7P or covering provider during after hours 7P -7A, for this patient.  Check the care team in [facility] and look for a) attending/consulting [facility] provider listed and b) the [facility] team listed Log into [email or web address] and use [place]'s universal password to access. If you do not have the password, please contact the hospital operator. Locate the [facility] provider you are looking for under [facility] and page to a number that you can be directly reached. If you still have difficulty reaching the  provider, please page the [facility] (Director on Call) for the Hospitalists listed on amion for assistance.  06/11/2024, 11:21 PM

## 2024-06-12 ENCOUNTER — Inpatient Hospital Stay (HOSPITAL_COMMUNITY)

## 2024-06-12 DIAGNOSIS — I3139 Other pericardial effusion (noninflammatory): Secondary | ICD-10-CM

## 2024-06-12 DIAGNOSIS — A419 Sepsis, unspecified organism: Secondary | ICD-10-CM | POA: Diagnosis not present

## 2024-06-12 DIAGNOSIS — N390 Urinary tract infection, site not specified: Secondary | ICD-10-CM

## 2024-06-12 LAB — CORTISOL: Cortisol, Plasma: 21.8 ug/dL

## 2024-06-12 LAB — BASIC METABOLIC PANEL WITH GFR
Anion gap: 12 (ref 5–15)
Anion gap: 10 (ref 5–15)
Anion gap: 9 (ref 5–15)
Anion gap: 8 (ref 5–15)
BUN: 31 mg/dL — ABNORMAL HIGH (ref 8–23)
BUN: 29 mg/dL — ABNORMAL HIGH (ref 8–23)
BUN: 24 mg/dL — ABNORMAL HIGH (ref 8–23)
BUN: 20 mg/dL (ref 8–23)
CO2: 26 mmol/L (ref 22–32)
CO2: 26 mmol/L (ref 22–32)
CO2: 30 mmol/L (ref 22–32)
CO2: 27 mmol/L (ref 22–32)
Calcium: 10.4 mg/dL — ABNORMAL HIGH (ref 8.9–10.3)
Calcium: 10.3 mg/dL (ref 8.9–10.3)
Calcium: 10 mg/dL (ref 8.9–10.3)
Calcium: 9.7 mg/dL (ref 8.9–10.3)
Chloride: 121 mmol/L — ABNORMAL HIGH (ref 98–111)
Chloride: 126 mmol/L — ABNORMAL HIGH (ref 98–111)
Chloride: 120 mmol/L — ABNORMAL HIGH (ref 98–111)
Chloride: 116 mmol/L — ABNORMAL HIGH (ref 98–111)
Creatinine, Ser: 1.37 mg/dL — ABNORMAL HIGH (ref 0.44–1.00)
Creatinine, Ser: 1.33 mg/dL — ABNORMAL HIGH (ref 0.44–1.00)
Creatinine, Ser: 1.21 mg/dL — ABNORMAL HIGH (ref 0.44–1.00)
Creatinine, Ser: 1.08 mg/dL — ABNORMAL HIGH (ref 0.44–1.00)
GFR, Estimated: 41 mL/min — ABNORMAL LOW (ref >60)
GFR, Estimated: 43 mL/min — ABNORMAL LOW (ref >60)
GFR, Estimated: 48 mL/min — ABNORMAL LOW (ref >60)
GFR, Estimated: 55 mL/min — ABNORMAL LOW (ref >60)
Glucose, Bld: 152 mg/dL — ABNORMAL HIGH (ref 70–99)
Glucose, Bld: 158 mg/dL — ABNORMAL HIGH (ref 70–99)
Glucose, Bld: 192 mg/dL — ABNORMAL HIGH (ref 70–99)
Glucose, Bld: 220 mg/dL — ABNORMAL HIGH (ref 70–99)
Potassium: 3.4 mmol/L — ABNORMAL LOW (ref 3.5–5.1)
Potassium: 3.4 mmol/L — ABNORMAL LOW (ref 3.5–5.1)
Potassium: 2.8 mmol/L — ABNORMAL LOW (ref 3.5–5.1)
Potassium: 3.5 mmol/L (ref 3.5–5.1)
Sodium: 159 mmol/L — ABNORMAL HIGH (ref 135–145)
Sodium: 162 mmol/L (ref 135–145)
Sodium: 159 mmol/L — ABNORMAL HIGH (ref 135–145)
Sodium: 151 mmol/L — ABNORMAL HIGH (ref 135–145)

## 2024-06-12 LAB — GLUCOSE, CAPILLARY
Glucose-Capillary: 175 mg/dL — ABNORMAL HIGH (ref 70–99)
Glucose-Capillary: 185 mg/dL — ABNORMAL HIGH (ref 70–99)
Glucose-Capillary: 236 mg/dL — ABNORMAL HIGH (ref 70–99)

## 2024-06-12 LAB — CBC
HCT: 26 % — ABNORMAL LOW (ref 36.0–46.0)
Hemoglobin: 7.9 g/dL — ABNORMAL LOW (ref 12.0–15.0)
MCH: 32.2 pg (ref 26.0–34.0)
MCHC: 30.4 g/dL (ref 30.0–36.0)
MCV: 106.1 fL — ABNORMAL HIGH (ref 80.0–100.0)
Platelets: 50 K/uL — ABNORMAL LOW (ref 150–400)
RBC: 2.45 MIL/uL — ABNORMAL LOW (ref 3.87–5.11)
RDW: 18.5 % — ABNORMAL HIGH (ref 11.5–15.5)
WBC: 11.2 K/uL — ABNORMAL HIGH (ref 4.0–10.5)
nRBC: 0 % (ref 0.0–0.2)

## 2024-06-12 LAB — PHOSPHORUS: Phosphorus: 1.3 mg/dL — ABNORMAL LOW (ref 2.5–4.6)

## 2024-06-12 LAB — TSH: TSH: 0.037 u[IU]/mL — ABNORMAL LOW (ref 0.350–4.500)

## 2024-06-12 LAB — ECHOCARDIOGRAM COMPLETE
Area-P 1/2: 4.44 cm2
Calc EF: 63.1 %
Height: 64 in
S' Lateral: 1.9 cm
Single Plane A2C EF: 68 %
Single Plane A4C EF: 56.5 %
Weight: 3350.99 [oz_av]

## 2024-06-12 LAB — HEMOGLOBIN A1C
Hgb A1c MFr Bld: 5.5 % (ref 4.8–5.6)
Mean Plasma Glucose: 111 mg/dL

## 2024-06-12 LAB — VITAMIN B12: Vitamin B-12: 1559 pg/mL — ABNORMAL HIGH (ref 180–914)

## 2024-06-12 LAB — AMMONIA: Ammonia: 25 umol/L (ref 9–35)

## 2024-06-12 LAB — PROCALCITONIN: Procalcitonin: 0.44 ng/mL

## 2024-06-12 LAB — CBG MONITORING, ED: Glucose-Capillary: 140 mg/dL — ABNORMAL HIGH (ref 70–99)

## 2024-06-12 LAB — T4, FREE: Free T4: 0.94 ng/dL (ref 0.61–1.12)

## 2024-06-12 LAB — LACTIC ACID, PLASMA: Lactic Acid, Venous: 1.7 mmol/L (ref 0.5–1.9)

## 2024-06-12 LAB — BRAIN NATRIURETIC PEPTIDE: B Natriuretic Peptide: 713 pg/mL — ABNORMAL HIGH (ref 0.0–100.0)

## 2024-06-12 LAB — OSMOLALITY: Osmolality: 349 mosm/kg (ref 275–295)

## 2024-06-12 MED ORDER — POTASSIUM CHLORIDE 10 MEQ/100ML IV SOLN
10.0000 meq | INTRAVENOUS | Status: AC
  Administered 2024-06-12 (×4): 10 meq via INTRAVENOUS
  Filled 2024-06-12 (×2): qty 100

## 2024-06-12 MED ORDER — POTASSIUM CHLORIDE 10 MEQ/100ML IV SOLN: 10.0000 meq | INTRAVENOUS | Status: DC

## 2024-06-12 MED ORDER — GUAIFENESIN ER 600 MG PO TB12
1200.0000 mg | ORAL_TABLET | Freq: Two times a day (BID) | ORAL | Status: DC
  Filled 2024-06-12: qty 2

## 2024-06-12 MED ORDER — IPRATROPIUM-ALBUTEROL 0.5-2.5 (3) MG/3ML IN SOLN
3.0000 mL | Freq: Three times a day (TID) | RESPIRATORY_TRACT | Status: DC
  Administered 2024-06-12 – 2024-06-14 (×6): 3 mL via RESPIRATORY_TRACT
  Filled 2024-06-12 (×8): qty 3

## 2024-06-12 MED ORDER — HYDRALAZINE HCL 20 MG/ML IJ SOLN: 10.0000 mg | INTRAMUSCULAR | Status: DC | PRN

## 2024-06-12 MED ORDER — POTASSIUM CHLORIDE 10 MEQ/100ML IV SOLN
10.0000 meq | INTRAVENOUS | Status: AC
  Administered 2024-06-12 (×2): 10 meq via INTRAVENOUS
  Filled 2024-06-12 (×4): qty 100

## 2024-06-12 MED ORDER — MAGNESIUM SULFATE 4 GM/100ML IV SOLN
4.0000 g | Freq: Once | INTRAVENOUS | Status: AC
  Administered 2024-06-12: 4 g via INTRAVENOUS
  Filled 2024-06-12: qty 100

## 2024-06-12 MED ORDER — DEXTROSE 5 % IV SOLN: INTRAVENOUS | Status: DC

## 2024-06-12 MED ORDER — SENNOSIDES-DOCUSATE SODIUM 8.6-50 MG PO TABS: 1.0000 | ORAL_TABLET | Freq: Every evening | ORAL | Status: DC | PRN

## 2024-06-12 MED ORDER — GUAIFENESIN 100 MG/5ML PO LIQD: 5.0000 mL | ORAL | Status: DC | PRN

## 2024-06-12 MED ORDER — POTASSIUM PHOSPHATES 15 MMOLE/5ML IV SOLN
30.0000 mmol | Freq: Three times a day (TID) | INTRAVENOUS | Status: AC
  Administered 2024-06-12 (×2): 30 mmol via INTRAVENOUS
  Filled 2024-06-12 (×2): qty 10

## 2024-06-12 MED ORDER — IPRATROPIUM-ALBUTEROL 0.5-2.5 (3) MG/3ML IN SOLN: 3.0000 mL | Freq: Three times a day (TID) | RESPIRATORY_TRACT | Status: DC

## 2024-06-12 MED ORDER — IPRATROPIUM-ALBUTEROL 0.5-2.5 (3) MG/3ML IN SOLN: 3.0000 mL | RESPIRATORY_TRACT | Status: DC | PRN

## 2024-06-12 MED ORDER — ONDANSETRON HCL 4 MG/2ML IJ SOLN: 4.0000 mg | Freq: Four times a day (QID) | INTRAMUSCULAR | Status: DC | PRN

## 2024-06-12 NOTE — Evaluation (Signed)
 Clinical/Bedside Swallow Evaluation Patient Details  Name: Claudia Keller MRN: [id number] Date of Birth: June 26, 1951  Today's Date: 06/12/2024 Time: SLP Start Time (ACUTE ONLY): 1439 SLP Stop Time (ACUTE ONLY): 1453 SLP Time Calculation (min) (ACUTE ONLY): 14 min  Past Medical History:  Past Medical History:  Diagnosis Date   Acute respiratory disease due to COVID-19 virus 12/07/2020   AMS (altered mental status) 11/05/2021   11/04/2021 - 11/10/2021 hospitalized initially with metabolic encephalopathy in the context of hypoxic respiratory failure with acute on chronic CHF.  Pericardiocentesis performed for pericarditis related bloody fluid.  Altered mental status attributed to UTI clinically.  11/04/2021 cath urine revealed multiple species.  Full course of Rocephin  completed.     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   Hypothyroidism    IBS (irritable bowel syndrome)    Ketoacidosis    Metabolic encephalopathy 11/09/2021   Mixed hyperlipidemia    PTSD (post-traumatic stress disorder)    S/P colonoscopy 11/2009   RMR: pancolonic diverticula, polypoid rectal mucosa with  prominent lymphoid aggregates, repeat in Jan 2016    Stress incontinence, female    Tobacco use    Type 2 diabetes mellitus (HCC)    Urinary tract infection 11/05/2021   Past Surgical History:  Past Surgical History:  Procedure Laterality Date   Bilateral tubal ligation     BIOPSY  11/28/2023   Procedure: BIOPSY;  Surgeon: Charlanne Groom, MD;  [facility] ENDOSCOPY;  Service: Gastroenterology;;   BREAST BIOPSY Left 10/16/2014   negative    CHOLECYSTECTOMY  09/11/2012   Procedure: LAPAROSCOPIC CHOLECYSTECTOMY;  Surgeon: Oneil DELENA Budge, MD;  Location: [facility] ORS;  Service: General;  Laterality: N/A;   COLONOSCOPY  11/17/2009   Dr. Shaaron: normal rectum, pancolonic diverticula, polyps benign, no adenomas.    COLONOSCOPY N/A 02/12/2015   Dr. Shaaron: colonic diverticulosis, multiple colonic polyps (tubular adenomas), negative segmental biopsies. Surveillance in 2021   ESOPHAGOGASTRODUODENOSCOPY  11/23/2011   Dr. Shaaron: Erosive reflux esophagitis; small hiatal hernia; esophagus dilated empirically   ESOPHAGOGASTRODUODENOSCOPY (EGD) WITH PROPOFOL  N/A 11/28/2023   Procedure: ESOPHAGOGASTRODUODENOSCOPY (EGD) WITH PROPOFOL ;  Surgeon: Charlanne Groom, MD;  Location: [facility] ENDOSCOPY;  Service: Gastroenterology;  Laterality: N/A;   MALONEY DILATION  11/23/2011   Procedure: AGAPITO DILATION;  Surgeon: Lamar CHRISTELLA Shaaron, MD;  Location: [facility] ENDO SUITE;  Service: Endoscopy;  Laterality: N/A;   PERICARDIOCENTESIS N/A 11/06/2021   Procedure: PERICARDIOCENTESIS;  Surgeon: Dann Candyce RAMAN, MD;  Location: [facility] INVASIVE CV LAB;  Service: Cardiovascular;  Laterality: N/A;   SKIN LESION EXCISION     on buttocks   TUBAL LIGATION     HPI:  Claudia Keller is a 73 yo female with PMH of HTN, dementia, GERD, prior CVA with residual hemiplegia, A-fib not on anticoagulation, T2DM, CHF, gout, depression/anxiety, PTSD, COPD, history of GI bleed (January 2025), CKD 3A, pericardial effusion. She presents from [facility] to ED 8/4 with lethargy and hypoxia. CT shows collapse/consolidation in the posterior segment R upper lobe and both lower lobes, likely a combination of aspiration and pneumonia.  Pt's family reports she typically consumes a pureed diet with thin liquids and aspiration precautions. Pt seen by SLP October 2022 after failing the [facility] s/p bilateral basal ganglia stroke with recommendations for Dys 2 solids with nectar thick liquids. Per chart review, instrumental testing  was not performed.    Assessment / Plan / Recommendation  Clinical Impression  Pt's family reports she typically consumes pureed solids with thin liquids at baseline but overall deny overt difficulty swallowing. She had difficulty maintaining labial seal and initiating oral transit with thin liquids. This was improved with purees. Breathy coughing followed sips of thin liquids. Given her history of CVA and admission for acute pneumonia, recommend proceeding with an MBS. Pending completion, continue NPO except meds crushed in puree and ice chips in moderation after oral care. SLP will f/u. SLP Visit Diagnosis: Dysphagia, unspecified (R13.10)    Aspiration Risk  Moderate aspiration risk    Diet Recommendation NPO except meds;Ice chips PRN after oral care    Medication Administration: Crushed with puree    Other  Recommendations Oral Care Recommendations: Oral care QID;Oral care prior to ice chip/H20     Assistance Recommended at Discharge    Functional Status Assessment Patient has had a recent decline in their functional status and demonstrates the ability to make significant improvements in function in a reasonable and predictable amount of time.  Frequency and Duration min 2x/week  2 weeks       Prognosis Prognosis for improved oropharyngeal function: Fair Barriers to Reach Goals: Cognitive deficits;Time post onset      Swallow Study   General HPI: Claudia Keller is a 73 yo female with PMH of HTN, dementia, GERD, prior CVA with residual hemiplegia, A-fib not on anticoagulation, T2DM, CHF, gout, depression/anxiety, PTSD, COPD, history of GI bleed (January 2025), CKD 3A, pericardial effusion. She presents from [facility] SN[facility] to ED 8/4 with lethargy and hypoxia. CT shows collapse/consolidation in the posterior segment R upper lobe and both lower lobes, likely a combination of aspiration and pneumonia. Pt's family reports she typically consumes a pureed diet with thin liquids and  aspiration precautions. Pt seen by SLP October 2022 after failing the [facility] s/p bilateral basal ganglia stroke with recommendations for Dys 2 solids with nectar thick liquids. Per chart review, instrumental testing was not performed. Type of Study: Bedside Swallow Evaluation Previous Swallow Assessment: see HPI Diet Prior to this Study: NPO Temperature Spikes Noted: No Respiratory Status: Nasal cannula History of Recent Intubation: No Behavior/Cognition: Alert;Cooperative Oral Cavity Assessment: Within Functional Limits Oral Care Completed by SLP: No Oral Cavity - Dentition: Edentulous Vision: Functional for self-feeding Self-Feeding Abilities: Total assist Patient Positioning: Upright in bed Baseline Vocal Quality: Normal Volitional Cough: Weak Volitional Swallow: Able to elicit    Oral/Motor/Sensory Function Overall Oral Motor/Sensory Function: Mild impairment Facial ROM: Reduced left;Suspected CN VII (facial) dysfunction Facial Symmetry: Abnormal symmetry left;Suspected CN VII (facial) dysfunction Facial Strength: Reduced left;Suspected CN VII (facial) dysfunction   Ice Chips Ice chips: Within functional limits Presentation: Spoon   Thin Liquid Thin Liquid: Impaired Presentation: Straw Oral Phase Impairments: Reduced labial seal;Reduced lingual movement/coordination Oral Phase Functional Implications: Oral holding;Right anterior spillage Pharyngeal  Phase Impairments: Suspected delayed Swallow;Multiple swallows;Wet Vocal Quality;Cough - Immediate;Throat Clearing - Immediate    Nectar Thick Nectar Thick Liquid: Not tested   Honey Thick Honey Thick Liquid: Not tested   Puree Puree: Within functional limits Presentation: Spoon   Solid     Solid: Not tested  Damien Blumenthal, M.A., CCC-SLP Speech Language Pathology, Acute Rehabilitation Services  Secure Chat preferred [phone]  06/12/2024,3:04 PM

## 2024-06-12 NOTE — ED Notes (Signed)
 Patient left the floor in stable condition with her belongings, family, and staff.

## 2024-06-12 NOTE — Progress Notes (Signed)
 PROGRESS NOTE    Claudia Keller  FMW:[id number] DOB: [birth year] DOA: 06/11/2024 PCP: Iver Elsie HERO, MD    Brief Narrative:  73 year old with history of CVA with residual hemiplegia, dementia, HTN, hypothyroidism, hyperlipidemia, GERD, CVA, A-fib not on anticoagulation, CKD 3A, DM 2, pericardial effusion, CHF, gout, depression, anxiety, PTSD, COPD, IBS, history of GI bleed January 2025 comes to the ED from [facility] for lethargy and hypoxia.  Upon admission noted to be severely dehydrated with hypernatremia, hypokalemia, hypercalcemia.  Chest x-ray showed concerns of bilateral pneumonia with cavitary lesions.  UA consistent with UTI.  CT head negative.  Admitted to the hospital for multiple electrolyte abnormality and sepsis secondary to pneumonia/UTI.   Assessment & Plan:  Principal Problem:   Sepsis secondary to aspiration pneumonia and UTI (HCC) Active Problems:   Insulin  dependent type 2 diabetes mellitus (HCC)   Acute metabolic encephalopathy   Hypernatremia   Paroxysmal atrial fibrillation (HCC)   PTSD (post-traumatic stress disorder)   Acute kidney injury superimposed on CKD (HCC)   Acute hypoxic respiratory failure (HCC)   GERD (gastroesophageal reflux disease)   Anxiety   History of CVA (cerebrovascular accident)   Dementia (HCC)   History of pericardial effusion   Bipolar disorder (HCC)   Hypokalemia   Hyperlipidemia   Hypercalcemia   History of dementia   Chronic diastolic CHF (congestive heart failure) (HCC)   Depression   History of COPD   History of GI bleed   Thrombocytopenia (HCC)   Acute cystitis   Severe sepsis secondary to pneumonia, concerning for aspiration/multifocal Urinary tract infection Severe sepsis physiology is slowly improving.  Concerns of urinary tract infection and multifocal opacity concerning for pneumonia/cavitary lesion.  Procalcitonin 0.36 - Broad-spectrum antibiotics-vancomycin , Unasyn  - COVID/flu/RSV-negative - Follow culture  data -Urine strep-negative, Legionella-pending  -Chest x-ray show evidence for bilateral pneumonia.  And concern for cavitary lesion. -CT chest abdomen pelvis showing collapse and consolidation posterior segment of the right upper lobe and both lower lobe likely combination of aspiration and pneumonia.   Acute hypoxic respiratory failure History of COPD - Currently 100% on 4 L nasal cannula.  Will continue to wean down oxygen - Procalcitonin 0.36, BNP elevated but will closely monitor - Bronchodilators, I-S/flutter valve - Will get speech and swallow evaluation    Severe hyponatremia, sodium 166 -D5 water , continue closely monitor sodium   Hypokalemia/hypomagnesemia Replete as needed.  Check phosphorus   Hypercalcemia-resolved Suspect from dehydration now improved   Acute kidney injury on superimposed CKD stage IIIa Baseline creatinine 1.0, admission creatinine 1.52.  Improving with IV fluids -Place Foley catheter   Acute metabolic encephalopathy - Likely from dehydration and multiple metabolic derangements underlying infection.  Continue IV fluids, antibiotics, CT head negative. - TSH low, check free T4 - Cortisol, ammonia unremarkable.     Thrombocytopenia - Likely in the setting of acute infection.  Platelets 50.  Closely monitor   Paroxysmal atrial fibrillation -Heart rate currently controlled in 80s -History of previous GI bleed not on anticoagulation anymore.     History of GI bleed   Anxiety, depression, PTSD Dementia -Due to altered mental status all medications on hold.   History of pericardial effusion Congestive heart failure preserved EF, 65% History of pericardial effusion.  Previous echo from 01/2022 showed trace pericardial effusion without any evidence of tamponade.  Preserved EF 60 to 65%. - Repeat Echo     History of CVA - Oral medication on hold  Extensive goals of care discussion with the patient's  son, daughter and DIL.  They agree and  understand patient is chronically ill and should really be DNR/DNI.  They are in agreement to change her CODE STATUS.     DVT prophylaxis:  SCDs Code Status:  Full Code.  Family Communication:   Status is: Inpatient Remains inpatient appropriate because: On going hospitalization for multiple issues.     Subjective: Seen at bedside.  Patient is moving around.  Responds to her name.  Per family she is not at her baseline.  She does have chronic left-sided deficits.  Had extensive discussion regarding her medical care and goals of care.  Family in agreement the patient should be DNR/DNI.  Otherwise we will continue medical cares.   Examination:  General exam: Chronically ill-appearing, 3 L nasal cannula Respiratory system: Clear to auscultation. Respiratory effort normal. Cardiovascular system: S1 & S2 heard, RRR. No JVD, murmurs, rubs, gallops or clicks. No pedal edema. Gastrointestinal system: Abdomen is nondistended, soft and nontender. No organomegaly or masses felt. Normal bowel sounds heard. Central nervous system: Chronic left-sided deficit.  Unable to fully examine as she does not necessarily follow all the commands Extremities: Some extremity contracture noted Skin: No rashes, lesions or ulcers Psychiatry: Judgement and insight appear poor               Diet Orders (From admission, onward)     Start     Ordered   06/11/24 2233  Diet NPO time specified  Diet effective now        06/11/24 2232            Objective: Vitals:   06/12/24 0715 06/12/24 0940 06/12/24 1000 06/12/24 1004  BP:    138/66  Pulse: 96  88 87  Resp: (!) 21  15 12   Temp:  97.8 F (36.6 C)    TempSrc:  Axillary    SpO2: 100%  100% 100%  Weight:      Height:        Intake/Output Summary (Last 24 hours) at 06/12/2024 1136 Last data filed at 06/11/2024 1445 Gross per 24 hour  Intake --  Output 350 ml  Net -350 ml   Filed Weights   06/11/24 1417  Weight: 95 kg    Scheduled  Meds:  Chlorhexidine  Gluconate Cloth  6 each Topical Daily   guaiFENesin   1,200 mg Oral BID   ipratropium-albuterol   3 mL Nebulization TID   sodium chloride  flush  3 mL Intravenous Q12H   Continuous Infusions:  sodium chloride      acetaminophen      ampicillin -sulbactam (UNASYN ) IV Stopped (06/12/24 1132)   dextrose      magnesium  sulfate bolus IVPB     potassium chloride      vancomycin       Nutritional status     Body mass index is 35.95 kg/m.  Data Reviewed:   CBC: Recent Labs  Lab 06/11/24 1450 06/11/24 2325 06/12/24 0504  WBC 12.7*  --  11.2*  NEUTROABS 9.9*  --   --   HGB 10.7* 8.5* 7.9*  HCT 35.6* 25.0* 26.0*  MCV 104.7*  --  106.1*  PLT 82*  --  50*   Basic Metabolic Panel: Recent Labs  Lab 06/11/24 1450 06/11/24 2215 06/11/24 2325 06/12/24 0130 06/12/24 0504  NA 166* 160* 162* 159* 162*  K 2.9* 3.8 3.6 3.4* 3.4*  CL 124* 121*  --  121* 126*  CO2 32 28  --  26 26  GLUCOSE 92 137*  --  152*  158*  BUN 37* 33*  --  31* 29*  CREATININE 1.52* 1.36*  --  1.37* 1.33*  CALCIUM  11.0* 9.9  --  10.4* 10.3  MG  --  1.2*  --   --   --    GFR: Estimated Creatinine Clearance: 43.4 mL/min (A) (by C-G formula based on SCr of 1.33 mg/dL (H)). Liver Function Tests: Recent Labs  Lab 06/11/24 1450  AST 26  ALT 15  ALKPHOS 73  BILITOT 0.8  PROT 5.0*  ALBUMIN  2.0*   No results for input(s): LIPASE, AMYLASE in the last 168 hours. Recent Labs  Lab 06/12/24 0946  AMMONIA 25   Coagulation Profile: No results for input(s): INR, PROTIME in the last 168 hours. Cardiac Enzymes: No results for input(s): CKTOTAL, CKMB, CKMBINDEX, TROPONINI in the last 168 hours. BNP (last 3 results) No results for input(s): PROBNP in the last 8760 hours. HbA1C: No results for input(s): HGBA1C in the last 72 hours. CBG: Recent Labs  Lab 06/11/24 2309 06/12/24 0741  GLUCAP 119* 140*   Lipid Profile: No results for input(s): CHOL, HDL, LDLCALC,  TRIG, CHOLHDL, LDLDIRECT in the last 72 hours. Thyroid  Function Tests: Recent Labs    06/12/24 0946  TSH 0.037*   Anemia Panel: No results for input(s): VITAMINB12, FOLATE, FERRITIN, TIBC, IRON, RETICCTPCT in the last 72 hours. Sepsis Labs: Recent Labs  Lab 06/11/24 1716 06/11/24 2014 06/11/24 2215 06/11/24 2228 06/12/24 0130 06/12/24 0946  PROCALCITON  --   --  0.36  --   --  0.44  LATICACIDVEN 3.7* 2.1*  --  1.6 1.7  --     Recent Results (from the past 240 hours)  Culture, blood (routine x 2)     Status: None (Preliminary result)   Collection Time: 06/11/24  2:50 PM   Specimen: BLOOD  Result Value Ref Range Status   Specimen Description BLOOD SITE NOT SPECIFIED  Final   Special Requests   Final    BOTTLES DRAWN AEROBIC AND ANAEROBIC Blood Culture results may not be optimal due to an inadequate volume of blood received in culture bottles   Culture   Final    NO GROWTH < 24 HOURS Performed at [facility], [address], [place], [place] [address]    Report Status PENDING  Incomplete  Culture, blood (routine x 2)     Status: None (Preliminary result)   Collection Time: 06/11/24  2:50 PM   Specimen: BLOOD  Result Value Ref Range Status   Specimen Description BLOOD SITE NOT SPECIFIED  Final   Special Requests   Final    BOTTLES DRAWN AEROBIC AND ANAEROBIC Blood Culture results may not be optimal due to an inadequate volume of blood received in culture bottles   Culture   Final    NO GROWTH < 24 HOURS Performed at [facility], [address] [place], [place] [address]    Report Status PENDING  Incomplete  Resp panel by RT-PCR (RSV, Flu A&B, Covid) Anterior Nasal Swab     Status: None   Collection Time: 06/11/24  3:06 PM   Specimen: Anterior Nasal Swab  Result Value Ref Range Status   SARS Coronavirus 2 by RT PCR NEGATIVE NEGATIVE Final   Influenza A by PCR NEGATIVE NEGATIVE Final   Influenza B by PCR NEGATIVE NEGATIVE Final     Comment: (NOTE) The Xpert Xpress SARS-CoV-2/FLU/RSV plus assay is intended as an aid in the diagnosis of influenza from Nasopharyngeal swab specimens  and should not be used as a sole basis for treatment. Nasal washings and aspirates are unacceptable for Xpert Xpress SARS-CoV-2/FLU/RSV testing.  Fact Sheet for Patients: BloggerCourse.com  Fact Sheet for Healthcare Providers: SeriousBroker.it  This test is not yet approved or cleared by the [place]  FDA and has been authorized for detection and/or diagnosis of SARS-CoV-2 by FDA under an Emergency Use Authorization (EUA). This EUA will remain in effect (meaning this test can be used) for the duration of the COVID-19 declaration under Section 564(b)(1) of the Act, 21 U.S.C. section 360bbb-3(b)(1), unless the authorization is terminated or revoked.     Resp Syncytial Virus by PCR NEGATIVE NEGATIVE Final    Comment: (NOTE) Fact Sheet for Patients: BloggerCourse.com  Fact Sheet for Healthcare Providers: SeriousBroker.it  This test is not yet approved or cleared by the [place]  FDA and has been authorized for detection and/or diagnosis of SARS-CoV-2 by FDA under an Emergency Use Authorization (EUA). This EUA will remain in effect (meaning this test can be used) for the duration of the COVID-19 declaration under Section 564(b)(1) of the Act, 21 U.S.C. section 360bbb-3(b)(1), unless the authorization is terminated or revoked.  Performed at [facility], [address] [place], [place] [address]          Radiology Studies: ECHOCARDIOGRAM COMPLETE Result Date: 06/12/2024    ECHOCARDIOGRAM REPORT   Patient Name:   Claudia Keller Date of Exam: 06/12/2024 Medical Rec #:  [id number]      Height:       64.0 in Accession #:    [id number]     Weight:       209.4 lb Date of Birth:  Sep 30, 1951      BSA:          1.995 m Patient  Age:    72 years       BP:           127/50 mmHg Patient Gender: F              HR:           87 bpm. Exam Location:  Inpatient Procedure: 2D Echo, Cardiac Doppler and Color Doppler (Both Spectral and Color            Flow Doppler were utilized during procedure). Indications:    Pericardial EFfusion  History:        Patient has prior history of Echocardiogram examinations. Risk                 Factors:Hypertension.  Sonographer:    Vella Key Referring Phys: SUBRINA SUNDIL IMPRESSIONS  1. Left ventricular ejection fraction, by estimation, is 60 to 65%. The left ventricle has normal function. The left ventricle has no regional wall motion abnormalities. There is mild asymmetric left ventricular hypertrophy of the basal and septal segments. Left ventricular diastolic parameters were normal.  2. Right ventricular systolic function is normal. The right ventricular size is normal.  3. The mitral valve is abnormal. Trivial mitral valve regurgitation. No evidence of mitral stenosis.  4. The aortic valve is tricuspid. There is moderate calcification of the aortic valve. There is moderate thickening of the aortic valve. Aortic valve regurgitation is not visualized. Aortic valve sclerosis/calcification is present, without any evidence of aortic stenosis.  5. The inferior vena cava is normal in size with greater than 50% respiratory variability, suggesting right atrial pressure of 3 mmHg. FINDINGS  Left Ventricle: Left ventricular ejection fraction, by estimation, is 60  to 65%. The left ventricle has normal function. The left ventricle has no regional wall motion abnormalities. Strain was performed and the global longitudinal strain is indeterminate. The left ventricular internal cavity size was normal in size. There is mild asymmetric left ventricular hypertrophy of the basal and septal segments. Left ventricular diastolic parameters were normal. Right Ventricle: The right ventricular size is normal. No increase in right  ventricular wall thickness. Right ventricular systolic function is normal. Left Atrium: Left atrial size was normal in size. Right Atrium: Right atrial size was normal in size. Pericardium: There is no evidence of pericardial effusion. Mitral Valve: The mitral valve is abnormal. There is mild thickening of the mitral valve leaflet(s). Trivial mitral valve regurgitation. No evidence of mitral valve stenosis. Tricuspid Valve: The tricuspid valve is normal in structure. Tricuspid valve regurgitation is mild . No evidence of tricuspid stenosis. Aortic Valve: The aortic valve is tricuspid. There is moderate calcification of the aortic valve. There is moderate thickening of the aortic valve. Aortic valve regurgitation is not visualized. Aortic valve sclerosis/calcification is present, without any  evidence of aortic stenosis. Pulmonic Valve: The pulmonic valve was normal in structure. Pulmonic valve regurgitation is mild. No evidence of pulmonic stenosis. Aorta: The aortic root is normal in size and structure. Venous: The inferior vena cava is normal in size with greater than 50% respiratory variability, suggesting right atrial pressure of 3 mmHg. IAS/Shunts: No atrial level shunt detected by color flow Doppler. Additional Comments: 3D was performed not requiring image post processing on an independent workstation and was indeterminate.  LEFT VENTRICLE PLAX 2D LVIDd:         3.00 cm     Diastology LVIDs:         1.90 cm     LV e' medial:    10.60 cm/s LV PW:         1.20 cm     LV E/e' medial:  10.4 LV IVS:        1.20 cm     LV e' lateral:   11.20 cm/s LVOT diam:     1.70 cm     LV E/e' lateral: 9.8 LV SV:         64 LV SV Index:   32 LVOT Area:     2.27 cm  LV Volumes (MOD) LV vol d, MOD A2C: 62.8 ml LV vol d, MOD A4C: 51.5 ml LV vol s, MOD A2C: 20.1 ml LV vol s, MOD A4C: 22.4 ml LV SV MOD A2C:     42.7 ml LV SV MOD A4C:     51.5 ml LV SV MOD BP:      36.3 ml RIGHT VENTRICLE RV Basal diam:  2.60 cm RV S prime:      17.20 cm/s TAPSE (M-mode): 1.8 cm LEFT ATRIUM             Index        RIGHT ATRIUM          Index LA diam:        3.30 cm 1.65 cm/m   RA Area:     7.35 cm LA Vol (A2C):   22.0 ml 11.03 ml/m  RA Volume:   13.10 ml 6.57 ml/m LA Vol (A4C):   18.7 ml 9.37 ml/m LA Biplane Vol: 20.7 ml 10.38 ml/m  AORTIC VALVE LVOT Vmax:   136.00 cm/s LVOT Vmean:  109.000 cm/s LVOT VTI:    0.280 m  AORTA Ao Root diam: 2.80  cm MITRAL VALVE MV Area (PHT): 4.44 cm     SHUNTS MV Decel Time: 171 msec     Systemic VTI:  0.28 m MV E velocity: 110.00 cm/s  Systemic Diam: 1.70 cm MV A velocity: 135.00 cm/s MV E/A ratio:  0.81 Maude Emmer MD Electronically signed by Maude Emmer MD Signature Date/Time: 06/12/2024/10:04:48 AM    Final    CT HEAD WO CONTRAST ( ) Result Date: 06/11/2024 CLINICAL DATA:  Mental status change. EXAM: CT HEAD WITHOUT CONTRAST TECHNIQUE: Contiguous axial images were obtained from the base of the skull through the vertex without intravenous contrast. RADIATION DOSE REDUCTION: This exam was performed according to the departmental dose-optimization program which includes automated exposure control, adjustment of the mA and/or kV according to patient size and/or use of iterative reconstruction technique. COMPARISON:  Head CT 03/06/2023 FINDINGS: Brain: No intracranial hemorrhage, mass effect, or midline shift. Stable degree of atrophy and chronic small vessel ischemia. No hydrocephalus. The basilar cisterns are patent. No evidence of territorial infarct or acute ischemia. No extra-axial or intracranial fluid collection. Vascular: Atherosclerosis of skullbase vasculature without hyperdense vessel or abnormal calcification. Skull: No fracture or focal lesion. Sinuses/Orbits: No acute findings. Other: None. IMPRESSION: 1. No acute intracranial abnormality. 2. Stable atrophy and chronic small vessel ischemia. Electronically Signed   By: Andrea Gasman M.D.   On: 06/11/2024 22:01   CT CHEST ABDOMEN PELVIS W  CONTRAST Result Date: 06/11/2024 CLINICAL DATA:  Sepsis. EXAM: CT CHEST, ABDOMEN, AND PELVIS WITH CONTRAST TECHNIQUE: Multidetector CT imaging of the chest, abdomen and pelvis was performed following the standard protocol during bolus administration of intravenous contrast. RADIATION DOSE REDUCTION: This exam was performed according to the departmental dose-optimization program which includes automated exposure control, adjustment of the mA and/or kV according to patient size and/or use of iterative reconstruction technique. CONTRAST:  75mL OMNIPAQUE  IOHEXOL  350 MG/ML SOLN COMPARISON:  CT abdomen pelvis 11/27/2023 and CT chest 11/09/2022. FINDINGS: CT CHEST FINDINGS Cardiovascular: Atherosclerotic calcification of the aorta, aortic valve and coronary arteries. Heart is enlarged. No pericardial effusion. Mediastinum/Nodes: No pathologically enlarged mediastinal, hilar or axillary lymph nodes. Air in the esophagus can be seen with dysmotility. Lungs/Pleura: Collapse/consolidation in the posterior segment right upper lobe and both lower lobes. Scattered ground-glass and mild septal thickening in the non dependent portions of both lungs. Trace left pleural fluid. Airway is unremarkable. Musculoskeletal: Degenerative changes in the spine. CT ABDOMEN PELVIS FINDINGS Hepatobiliary: Liver is unremarkable. Cholecystectomy. No biliary ductal dilatation. Pancreas: Negative. Spleen: Negative. Adrenals/Urinary Tract: Adrenal glands are unremarkable. Small bilateral renal stones. Subcentimeter low-attenuation lesions in the left kidney, too small to characterize. No specific follow-up necessary. Ureters are decompressed. High attenuation layers along the posterior bladder wall, new from 11/27/2023. Stomach/Bowel: Stomach, small bowel, appendix and colon are unremarkable. Moderate stool burden. Vascular/Lymphatic: Atherosclerotic calcification of the aorta. No pathologically enlarged lymph nodes. Reproductive: Uterus is  visualized.  No adnexal mass. Other: No free fluid.  Mesenteries and peritoneum are unremarkable. Musculoskeletal: Osteopenia.  Degenerative changes in the spine. IMPRESSION: 1. Collapse/consolidation in the posterior segment right upper lobe and both lower lobes, likely a combination aspiration and pneumonia. 2. Trace left pleural fluid. 3. Small bilateral renal stones with layering stone debris in the bladder. 4. Aortic atherosclerosis (ICD10-I70.0). Coronary artery calcification. 5. CT head dictated separately. Electronically Signed   By: Newell Eke M.D.   On: 06/11/2024 21:44   DG Chest Portable 1 View Result Date: 06/11/2024 CLINICAL DATA:  PICC placement. EXAM: PORTABLE CHEST 1  VIEW COMPARISON:  Radiograph earlier today FINDINGS: No central line is seen. There multiple overlying artifacts. The previous ring like opacity projecting over the right lung is not seen and was likely external to the patient. Central vascular engorgement with bilateral ill-defined perihilar opacities. No pneumothorax or large pleural effusion. Heart is normal in size. Aortic atherosclerosis. IMPRESSION: 1. No central line or PICC is seen. 2. Central vascular engorgement with bilateral ill-defined perihilar opacities. This may represent pulmonary edema or atelectasis. Electronically Signed   By: Andrea Gasman M.D.   On: 06/11/2024 19:57   DG Chest Portable 1 View Result Date: 06/11/2024 CLINICAL DATA:  Shortness of breath and altered mental status EXAM: PORTABLE CHEST 1 VIEW COMPARISON:  Chest radiograph dated 11/27/2023 FINDINGS: Low lung volumes with bronchovascular crowding. Bilateral patchy opacities, right-greater-than-left. Ring-like opacity in the right mid lung. No pleural effusion or pneumothorax. Enlarged cardiomediastinal silhouette is likely projectional. No acute osseous abnormality. IMPRESSION: 1. Bilateral patchy opacities, right-greater-than-left, may represent atelectasis, aspiration, or pneumonia. 2.  Ring-like opacity in the right mid lung, which may be artifactual related to overlying support apparatus or represent a cavitary lesion. Consider repeat chest radiograph with removal of overlying devices. Electronically Signed   By: Limin  Xu M.D.   On: 06/11/2024 15:03           LOS: 1 day   Time spent= 35 mins    Burgess JAYSON Dare, MD [facility]  If 7PM-7AM, please contact night-coverage  06/12/2024, 11:36 AM

## 2024-06-12 NOTE — Hospital Course (Addendum)
 Brief Narrative:  73 year old with history of CVA with residual hemiplegia, dementia, HTN, hypothyroidism, hyperlipidemia, GERD, CVA, A-fib not on anticoagulation, CKD 3A, DM 2, pericardial effusion, CHF, gout, depression, anxiety, PTSD, COPD, IBS, history of GI bleed January 2025 comes to the ED from [facility] for lethargy and hypoxia.  Upon admission noted to be severely dehydrated with hypernatremia, hypokalemia, hypercalcemia.  Chest x-ray showed concerns of bilateral pneumonia with cavitary lesions.  UA consistent with UTI.  CT head negative.  Admitted to the hospital for multiple electrolyte abnormality and sepsis secondary to pneumonia/UTI.   Assessment & Plan:  Principal Problem:   Sepsis secondary to aspiration pneumonia and UTI (HCC) Active Problems:   Insulin  dependent type 2 diabetes mellitus (HCC)   Acute metabolic encephalopathy   Hypernatremia   Paroxysmal atrial fibrillation (HCC)   PTSD (post-traumatic stress disorder)   Acute kidney injury superimposed on CKD (HCC)   Acute hypoxic respiratory failure (HCC)   GERD (gastroesophageal reflux disease)   Anxiety   History of CVA (cerebrovascular accident)   Dementia (HCC)   History of pericardial effusion   Bipolar disorder (HCC)   Hypokalemia   Hyperlipidemia   Hypercalcemia   History of dementia   Chronic diastolic CHF (congestive heart failure) (HCC)   Depression   History of COPD   History of GI bleed   Thrombocytopenia (HCC)   Acute cystitis   Severe sepsis secondary to pneumonia, concerning for aspiration/multifocal Urinary tract infection Severe sepsis physiology is slowly improving.  Concerns of urinary tract infection and multifocal opacity concerning for pneumonia/cavitary lesion.  Procalcitonin 0.36 - Broad-spectrum antibiotics-vancomycin , Unasyn  - COVID/flu/RSV-negative - Follow culture data -Urine strep-negative, Legionella-pending  -Chest x-ray show evidence for bilateral pneumonia.  And concern for  cavitary lesion. -CT chest abdomen pelvis showing collapse and consolidation posterior segment of the right upper lobe and both lower lobe likely combination of aspiration and pneumonia.   Acute hypoxic respiratory failure History of COPD - Currently 100% on 4 L nasal cannula.  Will continue to wean down oxygen - Procalcitonin 0.36, BNP elevated but will closely monitor - Bronchodilators, I-S/flutter valve - Will get speech and swallow evaluation    Severe hyponatremia, sodium 166 -D5 water , continue closely monitor sodium   Hypokalemia/hypomagnesemia Replete as needed.  Check phosphorus   Hypercalcemia-resolved Suspect from dehydration now improved   Acute kidney injury on superimposed CKD stage IIIa Baseline creatinine 1.0, admission creatinine 1.52.  Improving with IV fluids -Place Foley catheter   Acute metabolic encephalopathy - Likely from dehydration and multiple metabolic derangements underlying infection.  Continue IV fluids, antibiotics, CT head negative. - TSH low, check free T4 - Cortisol, ammonia unremarkable.     Thrombocytopenia - Likely in the setting of acute infection.  Platelets 50.  Closely monitor   Paroxysmal atrial fibrillation -Heart rate currently controlled in 80s -History of previous GI bleed not on anticoagulation anymore.     History of GI bleed   Anxiety, depression, PTSD Dementia -Due to altered mental status all medications on hold.   History of pericardial effusion Congestive heart failure preserved EF, 65% History of pericardial effusion.  Previous echo from 01/2022 showed trace pericardial effusion without any evidence of tamponade.  Preserved EF 60 to 65%. - Repeat Echo     History of CVA - Oral medication on hold  Extensive goals of care discussion with the patient's son, daughter and DIL.  They agree and understand patient is chronically ill and should really be DNR/DNI.  They are in  agreement to change her CODE STATUS.      DVT prophylaxis:  SCDs Code Status:  dnr Family Communication:   Status is: Inpatient Remains inpatient appropriate because: On going hospitalization for multiple issues.     Subjective: Seen at bedside.  Patient is moving around.  Responds to her name.  Per family she is not at her baseline.  She does have chronic left-sided deficits.  Had extensive discussion regarding her medical care and goals of care.  Family in agreement the patient should be DNR/DNI.  Otherwise we will continue medical cares.   Examination:  General exam: Chronically ill-appearing, 3 L nasal cannula Respiratory system: Clear to auscultation. Respiratory effort normal. Cardiovascular system: S1 & S2 heard, RRR. No JVD, murmurs, rubs, gallops or clicks. No pedal edema. Gastrointestinal system: Abdomen is nondistended, soft and nontender. No organomegaly or masses felt. Normal bowel sounds heard. Central nervous system: Chronic left-sided deficit.  Unable to fully examine as she does not necessarily follow all the commands Extremities: Some extremity contracture noted Skin: No rashes, lesions or ulcers Psychiatry: Judgement and insight appear poor

## 2024-06-12 NOTE — Plan of Care (Signed)
   Problem: Fluid Volume: Goal: Ability to maintain a balanced intake and output will improve Outcome: Progressing   Problem: Metabolic: Goal: Ability to maintain appropriate glucose levels will improve Outcome: Progressing   Problem: Nutritional: Goal: Maintenance of adequate nutrition will improve Outcome: Progressing

## 2024-06-12 NOTE — Plan of Care (Signed)
  BMP showing serum sodium is 162. Initial presentation serum sodium was 166 which improved to 159 with maintenance fluid LR however in the morning it has been present around 162. - Starting D5W 50 cc/h for management of hyponatremia. - Also continue LR 125 cc for management of sepsis.  Claudia Weinrich, MD [facility] 06/12/2024, 6:02 AM

## 2024-06-12 NOTE — ED Notes (Signed)
 CCMD called and noticed

## 2024-06-13 ENCOUNTER — Inpatient Hospital Stay (HOSPITAL_COMMUNITY)

## 2024-06-13 DIAGNOSIS — E119 Type 2 diabetes mellitus without complications: Secondary | ICD-10-CM | POA: Diagnosis not present

## 2024-06-13 DIAGNOSIS — G9341 Metabolic encephalopathy: Secondary | ICD-10-CM | POA: Diagnosis not present

## 2024-06-13 DIAGNOSIS — A419 Sepsis, unspecified organism: Secondary | ICD-10-CM | POA: Diagnosis not present

## 2024-06-13 DIAGNOSIS — Z7189 Other specified counseling: Secondary | ICD-10-CM | POA: Diagnosis not present

## 2024-06-13 DIAGNOSIS — E870 Hyperosmolality and hypernatremia: Secondary | ICD-10-CM | POA: Diagnosis not present

## 2024-06-13 DIAGNOSIS — R410 Disorientation, unspecified: Secondary | ICD-10-CM

## 2024-06-13 DIAGNOSIS — I480 Paroxysmal atrial fibrillation: Secondary | ICD-10-CM | POA: Diagnosis not present

## 2024-06-13 LAB — CBC
HCT: 23.3 % — ABNORMAL LOW (ref 36.0–46.0)
Hemoglobin: 7.3 g/dL — ABNORMAL LOW (ref 12.0–15.0)
MCH: 32 pg (ref 26.0–34.0)
MCHC: 31.3 g/dL (ref 30.0–36.0)
MCV: 102.2 fL — ABNORMAL HIGH (ref 80.0–100.0)
Platelets: 37 K/uL — ABNORMAL LOW (ref 150–400)
RBC: 2.28 MIL/uL — ABNORMAL LOW (ref 3.87–5.11)
RDW: 18.6 % — ABNORMAL HIGH (ref 11.5–15.5)
WBC: 9 K/uL (ref 4.0–10.5)
nRBC: 0 % (ref 0.0–0.2)

## 2024-06-13 LAB — GLUCOSE, CAPILLARY
Glucose-Capillary: 205 mg/dL — ABNORMAL HIGH (ref 70–99)
Glucose-Capillary: 154 mg/dL — ABNORMAL HIGH (ref 70–99)
Glucose-Capillary: 164 mg/dL — ABNORMAL HIGH (ref 70–99)
Glucose-Capillary: 146 mg/dL — ABNORMAL HIGH (ref 70–99)

## 2024-06-13 LAB — BASIC METABOLIC PANEL WITH GFR
Anion gap: 9 (ref 5–15)
BUN: 17 mg/dL (ref 8–23)
CO2: 25 mmol/L (ref 22–32)
Calcium: 8.9 mg/dL (ref 8.9–10.3)
Chloride: 112 mmol/L — ABNORMAL HIGH (ref 98–111)
Creatinine, Ser: 1.16 mg/dL — ABNORMAL HIGH (ref 0.44–1.00)
GFR, Estimated: 50 mL/min — ABNORMAL LOW (ref >60)
Glucose, Bld: 250 mg/dL — ABNORMAL HIGH (ref 70–99)
Potassium: 4 mmol/L (ref 3.5–5.1)
Sodium: 146 mmol/L — ABNORMAL HIGH (ref 135–145)

## 2024-06-13 LAB — PHOSPHORUS: Phosphorus: 4.5 mg/dL (ref 2.5–4.6)

## 2024-06-13 LAB — LEGIONELLA PNEUMOPHILA SEROGP 1 UR AG: L. pneumophila Serogp 1 Ur Ag: NEGATIVE

## 2024-06-13 LAB — FOLATE: Folate: 9.8 ng/mL (ref >5.9)

## 2024-06-13 LAB — MAGNESIUM: Magnesium: 1.9 mg/dL (ref 1.7–2.4)

## 2024-06-13 MED ORDER — SODIUM CHLORIDE 0.9 % IV SOLN: INTRAVENOUS | Status: DC

## 2024-06-13 MED ORDER — GERHARDT'S BUTT CREAM
TOPICAL_CREAM | Freq: Two times a day (BID) | CUTANEOUS | Status: DC
  Administered 2024-06-13 – 2024-06-14 (×2): 1 via TOPICAL
  Filled 2024-06-13: qty 60

## 2024-06-13 MED ORDER — MEDIHONEY WOUND/BURN DRESSING EX PSTE
1.0000 | PASTE | Freq: Every day | CUTANEOUS | Status: DC
  Administered 2024-06-13 – 2024-06-15 (×3): 1 via TOPICAL
  Filled 2024-06-13: qty 44

## 2024-06-13 NOTE — Progress Notes (Addendum)
 PROGRESS NOTE        PATIENT DETAILS Name: Claudia Keller Age: 73 y.o. Sex: female Date of Birth: [birth year] Admit Date: 06/11/2024 Admitting Physician Micaela Speaker, MD ERE:Izfjmryp, Elsie HERO, MD  Brief Summary: Patient is a 73 y.o.  female with history of dementia, CVA, CKD stage IIIa, DM-2, HFpEF, anxiety/depression-who presented from SNF with lethargy/hypoxemia-she was found to have acute hypoxic respiratory failure secondary to aspiration pneumonia and was found to have severe hypernatremia.  Significant events: 8/4>> admit to [facility]  Significant studies: 8/4>> CT head: No acute intracranial abnormality 8/4>> CT chest/abdomen/pelvis: B/L consolidation both lower lobes, right upper lobe. 8/5>> echo: EF 60-65%  Significant microbiology data: 8/4>> COVID/influenza/RSV PCR: Negative 8/4>> urine culture: Gram-negative rods. 8/4>> blood culture: Negative  Procedures: None  Consults: None  Subjective: Confused-opens eyes-no family at bedside-does not follow any commands.  Mumbles/groans.  Objective: Vitals: Blood pressure (!) 119/56, pulse 75, temperature (!) 97.5 F (36.4 C), temperature source Axillary, resp. rate (!) 22, height 5' 4 (1.626 m), weight 95 kg, SpO2 93%.   Exam: Gen Exam: Confused-hard to arouse-not in any distress. HEENT:atraumatic, normocephalic Chest: B/L clear to auscultation anteriorly CVS:S1S2 regular Abdomen:soft non tender, non distended Extremities:no edema Neurology: Difficult exam-seems to be moving all 4 extremities in response to pain. Skin: no rash  Pertinent Labs/Radiology:    Latest Ref Rng & Units 06/13/2024    5:55 AM 06/12/2024    5:04 AM 06/11/2024   11:25 PM  CBC  WBC 4.0 - 10.5 K/uL 9.0  11.2    Hemoglobin 12.0 - 15.0 g/dL 7.3  7.9  8.5   Hematocrit 36.0 - 46.0 % 23.3  26.0  25.0   Platelets 150 - 400 K/uL 37  50      Lab Results  Component Value Date   NA 146 (H) 06/13/2024   K 4.0 06/13/2024    CL 112 (H) 06/13/2024   CO2 25 06/13/2024      Assessment/Plan: Severe sepsis secondary to aspiration pneumonia/UTI Sepsis physiology has resolved Culture data as above Continue empiric antibiotics  Acute hypoxic respiratory failure secondary to aspiration pneumonia Requiring around 3 L of oxygen via [place] Continue Unasyn -should be okay to stop vancomycin  at this point. Supportive care Incentive spirometry/flutter valve if possible-but confused/not following commands Standard aspiration precautions SLP following-apparently doing further MBS will not change management-recommendations are to consider comfort p.o. feeds. Reached out to patient's son 8/6-DNR confirmed-recommended that we consider transitioning to comfort measures/comfort feeds-he will touch base with his siblings and let us  know.  Oropharyngeal dysphagia Secondary to combination of dementia/prior CVA Per SLP eval-further MBS would likely not change outcome See above regarding plans to reach out to family/palliative care eval.  Hypernatremia Secondary dehydration in the setting of infection Improving-continue supportive care.  Hypokalemia Repleted  AKI on CKD stage IIIa Hemodynamically mediated-secondary to dehydration Improved with IVF  Normocytic anemia Likely secondary to critical illness No obvious evidence of GI bleed/blood loss Follow CBC  Thrombocytopenia Unclear etiology-?  Sepsis physiology Worsening-thankfully no obvious bleeding Very frail-significant encephalopathy with severe failure to thrive  syndrome-do not think further workup will change management outcome. Goals of care in progress-see below.  Acute metabolic encephalopathy superimposed on dementia Encephalopathy secondary to infection/in the future-and electrolyte abnormalities. Delirium precautions Continue to treat underlying etiology.  PAF Rate controlled Per prior notes-no longer on anticoagulation due  to history of GI  bleed.  Chronic HFpEF Currently euvolemic on exam  History of pericardial effusion None seen on echo on 8/5.  DM 2 SSI  Recent Labs    06/12/24 1746 06/12/24 2356 06/13/24 0649  GLUCAP 185* 236* 205*     COPD Bronchodilators  History of CVA Resume oral medications when able.  Currently n.p.o.-see above regarding oropharyngeal dysphagia.  Palliative care Failure to thrive  syndrome DNR in place Very frail 73 year old-SNF resident prior CVA-prior history of dysphagia-here with aspiration pneumonia/hyponatremia and severe encephalopathy with failure to thrive  syndrome.  Prognosis is very poor-discussed with SLP-really no other means to improve her swallowing-further MBS will not change management or outcome.  Highly likely that she will continue to have recurrent episodes of aspiration-requiring further hospitalization for one reason or the other.  Spoke with son over the phone on 8/6-poor overall prognosis-high likelihood of reoccurrence of aspiration/electrolyte abnormalities discussed in great detail-explained that patient would be a poor candidate for tube feeds-will likely not do well in any event.  Encouraged him to talk to his family/siblings about comfort measures/hospice care.  We discussed what hospice care would look like.  I will consult palliative care for further goals of care discussion.  Will await family decision.  In the meantime continue gentle medical treatment without any further escalation in care  Class 2 Obesity: Estimated body mass index is 35.95 kg/m as calculated from the following:   Height as of this encounter: 5' 4 (1.626 m).   Weight as of this encounter: 95 kg.   Code status:   Code Status: Do not attempt resuscitation (DNR) PRE-ARREST INTERVENTIONS DESIRED   DVT Prophylaxis: SCDs Start: 06/11/24 2233 Place TED hose Start: 06/11/24 2233  Family Communication:Son Claudia Keller [phone] updated over the phone 8/6   Disposition Plan: Status is:  Inpatient Remains inpatient appropriate because: Severity of illness   Planned Discharge Destination: SNF versus hospice   Diet: Diet Order             Diet NPO time specified  Diet effective now                     Antimicrobial agents: Anti-infectives (From admission, onward)    Start     Dose/Rate Route Frequency Ordered Stop   06/12/24 1600  vancomycin  (VANCOREADY) IVPB 750 mg/150 mL        750 mg 150 mL/hr over 60 Minutes Intravenous Every 24 hours 06/11/24 2303     06/12/24 0500  Ampicillin -Sulbactam (UNASYN ) 3 g in sodium chloride  0.9 % 100 mL IVPB  Status:  Discontinued        3 g 200 mL/hr over 30 Minutes Intravenous Every 6 hours 06/11/24 2303 06/11/24 2304   06/12/24 0300  Ampicillin -Sulbactam (UNASYN ) 3 g in sodium chloride  0.9 % 100 mL IVPB        3 g 200 mL/hr over 30 Minutes Intravenous Every 6 hours 06/11/24 2304     06/11/24 1445  vancomycin  (VANCOCIN ) IVPB 1000 mg/200 mL premix  Status:  Discontinued        1,000 mg 200 mL/hr over 60 Minutes Intravenous  Once 06/11/24 1440 06/11/24 1443   06/11/24 1445  ceFEPIme  (MAXIPIME ) 2 g in sodium chloride  0.9 % 100 mL IVPB        2 g 200 mL/hr over 30 Minutes Intravenous  Once 06/11/24 1440 06/11/24 1538   06/11/24 1445  vancomycin  (VANCOREADY) IVPB 2000 mg/400 mL  2,000 mg 200 mL/hr over 120 Minutes Intravenous  Once 06/11/24 1443 06/11/24 1800        MEDICATIONS: Scheduled Meds:  Chlorhexidine  Gluconate Cloth  6 each Topical Daily   Gerhardt's butt cream   Topical BID   guaiFENesin   1,200 mg Oral BID   ipratropium-albuterol   3 mL Nebulization TID   leptospermum manuka honey  1 Application Topical Daily   sodium chloride  flush  3 mL Intravenous Q12H   Continuous Infusions:  ampicillin -sulbactam (UNASYN ) IV 3 g (06/13/24 0318)   vancomycin  750 mg (06/12/24 1644)   PRN Meds:.dextrose , guaiFENesin , hydrALAZINE , insulin  aspart, ipratropium-albuterol , ondansetron  (ZOFRAN ) IV, senna-docusate,  sodium chloride  flush, trimethobenzamide    I have personally reviewed following labs and imaging studies  LABORATORY DATA: CBC: Recent Labs  Lab 06/11/24 1450 06/11/24 2325 06/12/24 0504 06/13/24 0555  WBC 12.7*  --  11.2* 9.0  NEUTROABS 9.9*  --   --   --   HGB 10.7* 8.5* 7.9* 7.3*  HCT 35.6* 25.0* 26.0* 23.3*  MCV 104.7*  --  106.1* 102.2*  PLT 82*  --  50* 37*    Basic Metabolic Panel: Recent Labs  Lab 06/11/24 2215 06/11/24 2325 06/12/24 0130 06/12/24 0504 06/12/24 1240 06/12/24 2121 06/13/24 0555  NA 160*   < > 159* 162* 159* 151* 146*  K 3.8   < > 3.4* 3.4* 2.8* 3.5 4.0  CL 121*  --  121* 126* 120* 116* 112*  CO2 28  --  26 26 30 27 25   GLUCOSE 137*  --  152* 158* 192* 220* 250*  BUN 33*  --  31* 29* 24* 20 17  CREATININE 1.36*  --  1.37* 1.33* 1.21* 1.08* 1.16*  CALCIUM  9.9  --  10.4* 10.3 10.0 9.7 8.9  MG 1.2*  --   --   --   --   --  1.9  PHOS  --   --   --   --  1.3*  --  4.5   < > = values in this interval not displayed.    GFR: Estimated Creatinine Clearance: 49.7 mL/min (A) (by C-G formula based on SCr of 1.16 mg/dL (H)).  Liver Function Tests: Recent Labs  Lab 06/11/24 1450  AST 26  ALT 15  ALKPHOS 73  BILITOT 0.8  PROT 5.0*  ALBUMIN  2.0*   No results for input(s): LIPASE, AMYLASE in the last 168 hours. Recent Labs  Lab 06/12/24 0946  AMMONIA 25    Coagulation Profile: No results for input(s): INR, PROTIME in the last 168 hours.  Cardiac Enzymes: No results for input(s): CKTOTAL, CKMB, CKMBINDEX, TROPONINI in the last 168 hours.  BNP (last 3 results) No results for input(s): PROBNP in the last 8760 hours.  Lipid Profile: No results for input(s): CHOL, HDL, LDLCALC, TRIG, CHOLHDL, LDLDIRECT in the last 72 hours.  Thyroid  Function Tests: Recent Labs    06/12/24 0946 06/12/24 1240  TSH 0.037*  --   FREET4  --  0.94    Anemia Panel: Recent Labs    06/12/24 0946 06/13/24 0555  VITAMINB12  1,559*  --   FOLATE  --  9.8    Urine analysis:    Component Value Date/Time   COLORURINE AMBER (A) 06/11/2024 1506   APPEARANCEUR CLOUDY (A) 06/11/2024 1506   LABSPEC 1.017 06/11/2024 1506   PHURINE 6.0 06/11/2024 1506   GLUCOSEU NEGATIVE 06/11/2024 1506   HGBUR NEGATIVE 06/11/2024 1506   BILIRUBINUR NEGATIVE 06/11/2024 1506   KETONESUR NEGATIVE 06/11/2024  1506   PROTEINUR 100 (A) 06/11/2024 1506   UROBILINOGEN 1.0 01/10/2015 1754   NITRITE NEGATIVE 06/11/2024 1506   LEUKOCYTESUR TRACE (A) 06/11/2024 1506    Sepsis Labs: Lactic Acid, Venous    Component Value Date/Time   LATICACIDVEN 1.7 06/12/2024 0130    MICROBIOLOGY: Recent Results (from the past 240 hours)  Culture, blood (routine x 2)     Status: None (Preliminary result)   Collection Time: 06/11/24  2:50 PM   Specimen: BLOOD  Result Value Ref Range Status   Specimen Description BLOOD SITE NOT SPECIFIED  Final   Special Requests   Final    BOTTLES DRAWN AEROBIC AND ANAEROBIC Blood Culture results may not be optimal due to an inadequate volume of blood received in culture bottles   Culture   Final    NO GROWTH 2 DAYS Performed at [facility], [address], [place], [place] [address]    Report Status PENDING  Incomplete  Culture, blood (routine x 2)     Status: None (Preliminary result)   Collection Time: 06/11/24  2:50 PM   Specimen: BLOOD  Result Value Ref Range Status   Specimen Description BLOOD SITE NOT SPECIFIED  Final   Special Requests   Final    BOTTLES DRAWN AEROBIC AND ANAEROBIC Blood Culture results may not be optimal due to an inadequate volume of blood received in culture bottles   Culture   Final    NO GROWTH 2 DAYS Performed at [facility], [address], [place], [place] [address]    Report Status PENDING  Incomplete  Resp panel by RT-PCR (RSV, Flu A&B, Covid) Anterior Nasal Swab     Status: None   Collection Time: 06/11/24  3:06 PM   Specimen: Anterior Nasal Swab  Result  Value Ref Range Status   SARS Coronavirus 2 by RT PCR NEGATIVE NEGATIVE Final   Influenza A by PCR NEGATIVE NEGATIVE Final   Influenza B by PCR NEGATIVE NEGATIVE Final    Comment: (NOTE) The Xpert Xpress SARS-CoV-2/FLU/RSV plus assay is intended as an aid in the diagnosis of influenza from Nasopharyngeal swab specimens and should not be used as a sole basis for treatment. Nasal washings and aspirates are unacceptable for Xpert Xpress SARS-CoV-2/FLU/RSV testing.  Fact Sheet for Patients: BloggerCourse.com  Fact Sheet for Healthcare Providers: SeriousBroker.it  This test is not yet approved or cleared by the [place]  FDA and has been authorized for detection and/or diagnosis of SARS-CoV-2 by FDA under an Emergency Use Authorization (EUA). This EUA will remain in effect (meaning this test can be used) for the duration of the COVID-19 declaration under Section 564(b)(1) of the Act, 21 U.S.C. section 360bbb-3(b)(1), unless the authorization is terminated or revoked.     Resp Syncytial Virus by PCR NEGATIVE NEGATIVE Final    Comment: (NOTE) Fact Sheet for Patients: BloggerCourse.com  Fact Sheet for Healthcare Providers: SeriousBroker.it  This test is not yet approved or cleared by the [place]  FDA and has been authorized for detection and/or diagnosis of SARS-CoV-2 by FDA under an Emergency Use Authorization (EUA). This EUA will remain in effect (meaning this test can be used) for the duration of the COVID-19 declaration under Section 564(b)(1) of the Act, 21 U.S.C. section 360bbb-3(b)(1), unless the authorization is terminated or revoked.  Performed at [facility], [address] [place], [place] [address]   Urine Culture     Status: Abnormal (Preliminary result)   Collection Time: 06/11/24  3:06 PM   Specimen: Urine, Random  Result Value Ref Range Status    Specimen Description URINE, RANDOM  Final   Special Requests NONE Reflexed from [id number]  Final   Culture (A)  Final    >=100,000 COLONIES/mL GRAM NEGATIVE RODS IDENTIFICATION AND SUSCEPTIBILITIES TO FOLLOW CULTURE REINCUBATED FOR BETTER GROWTH Performed at [facility], [address], [place], [place] [address]    Report Status PENDING  Incomplete    RADIOLOGY STUDIES/RESULTS: ECHOCARDIOGRAM COMPLETE Result Date: 06/12/2024    ECHOCARDIOGRAM REPORT   Patient Name:   JAKEIA CARRERAS Date of Exam: 06/12/2024 Medical Rec #:  [id number]      Height:       64.0 in Accession #:    [id number]     Weight:       209.4 lb Date of Birth:  22-Oct-1951      BSA:          1.995 m Patient Age:    72 years       BP:           127/50 mmHg Patient Gender: F              HR:           87 bpm. Exam Location:  Inpatient Procedure: 2D Echo, Cardiac Doppler and Color Doppler (Both Spectral and Color            Flow Doppler were utilized during procedure). Indications:    Pericardial EFfusion  History:        Patient has prior history of Echocardiogram examinations. Risk                 Factors:Hypertension.  Sonographer:    Vella Key Referring Phys: SUBRINA SUNDIL IMPRESSIONS  1. Left ventricular ejection fraction, by estimation, is 60 to 65%. The left ventricle has normal function. The left ventricle has no regional wall motion abnormalities. There is mild asymmetric left ventricular hypertrophy of the basal and septal segments. Left ventricular diastolic parameters were normal.  2. Right ventricular systolic function is normal. The right ventricular size is normal.  3. The mitral valve is abnormal. Trivial mitral valve regurgitation. No evidence of mitral stenosis.  4. The aortic valve is tricuspid. There is moderate calcification of the aortic valve. There is moderate thickening of the aortic valve. Aortic valve regurgitation is not visualized. Aortic valve sclerosis/calcification is present, without any evidence of aortic  stenosis.  5. The inferior vena cava is normal in size with greater than 50% respiratory variability, suggesting right atrial pressure of 3 mmHg. FINDINGS  Left Ventricle: Left ventricular ejection fraction, by estimation, is 60 to 65%. The left ventricle has normal function. The left ventricle has no regional wall motion abnormalities. Strain was performed and the global longitudinal strain is indeterminate. The left ventricular internal cavity size was normal in size. There is mild asymmetric left ventricular hypertrophy of the basal and septal segments. Left ventricular diastolic parameters were normal. Right Ventricle: The right ventricular size is normal. No increase in right ventricular wall thickness. Right ventricular systolic function is normal. Left Atrium: Left atrial size was normal in size. Right Atrium: Right atrial size was normal in size. Pericardium: There is no evidence of pericardial effusion. Mitral Valve: The mitral valve is abnormal. There is mild thickening of the mitral valve leaflet(s). Trivial mitral valve regurgitation. No evidence of mitral valve stenosis. Tricuspid Valve: The tricuspid valve is normal in structure. Tricuspid valve regurgitation is mild .  No evidence of tricuspid stenosis. Aortic Valve: The aortic valve is tricuspid. There is moderate calcification of the aortic valve. There is moderate thickening of the aortic valve. Aortic valve regurgitation is not visualized. Aortic valve sclerosis/calcification is present, without any  evidence of aortic stenosis. Pulmonic Valve: The pulmonic valve was normal in structure. Pulmonic valve regurgitation is mild. No evidence of pulmonic stenosis. Aorta: The aortic root is normal in size and structure. Venous: The inferior vena cava is normal in size with greater than 50% respiratory variability, suggesting right atrial pressure of 3 mmHg. IAS/Shunts: No atrial level shunt detected by color flow Doppler. Additional Comments: 3D was  performed not requiring image post processing on an independent workstation and was indeterminate.  LEFT VENTRICLE PLAX 2D LVIDd:         3.00 cm     Diastology LVIDs:         1.90 cm     LV e' medial:    10.60 cm/s LV PW:         1.20 cm     LV E/e' medial:  10.4 LV IVS:        1.20 cm     LV e' lateral:   11.20 cm/s LVOT diam:     1.70 cm     LV E/e' lateral: 9.8 LV SV:         64 LV SV Index:   32 LVOT Area:     2.27 cm  LV Volumes (MOD) LV vol d, MOD A2C: 62.8 ml LV vol d, MOD A4C: 51.5 ml LV vol s, MOD A2C: 20.1 ml LV vol s, MOD A4C: 22.4 ml LV SV MOD A2C:     42.7 ml LV SV MOD A4C:     51.5 ml LV SV MOD BP:      36.3 ml RIGHT VENTRICLE RV Basal diam:  2.60 cm RV S prime:     17.20 cm/s TAPSE (M-mode): 1.8 cm LEFT ATRIUM             Index        RIGHT ATRIUM          Index LA diam:        3.30 cm 1.65 cm/m   RA Area:     7.35 cm LA Vol (A2C):   22.0 ml 11.03 ml/m  RA Volume:   13.10 ml 6.57 ml/m LA Vol (A4C):   18.7 ml 9.37 ml/m LA Biplane Vol: 20.7 ml 10.38 ml/m  AORTIC VALVE LVOT Vmax:   136.00 cm/s LVOT Vmean:  109.000 cm/s LVOT VTI:    0.280 m  AORTA Ao Root diam: 2.80 cm MITRAL VALVE MV Area (PHT): 4.44 cm     SHUNTS MV Decel Time: 171 msec     Systemic VTI:  0.28 m MV E velocity: 110.00 cm/s  Systemic Diam: 1.70 cm MV A velocity: 135.00 cm/s MV E/A ratio:  0.81 Maude Emmer MD Electronically signed by Maude Emmer MD Signature Date/Time: 06/12/2024/10:04:48 AM    Final    CT HEAD WO CONTRAST ( ) Result Date: 06/11/2024 CLINICAL DATA:  Mental status change. EXAM: CT HEAD WITHOUT CONTRAST TECHNIQUE: Contiguous axial images were obtained from the base of the skull through the vertex without intravenous contrast. RADIATION DOSE REDUCTION: This exam was performed according to the departmental dose-optimization program which includes automated exposure control, adjustment of the mA and/or kV according to patient size and/or use of iterative reconstruction technique. COMPARISON:  Head CT 03/06/2023  FINDINGS:  Brain: No intracranial hemorrhage, mass effect, or midline shift. Stable degree of atrophy and chronic small vessel ischemia. No hydrocephalus. The basilar cisterns are patent. No evidence of territorial infarct or acute ischemia. No extra-axial or intracranial fluid collection. Vascular: Atherosclerosis of skullbase vasculature without hyperdense vessel or abnormal calcification. Skull: No fracture or focal lesion. Sinuses/Orbits: No acute findings. Other: None. IMPRESSION: 1. No acute intracranial abnormality. 2. Stable atrophy and chronic small vessel ischemia. Electronically Signed   By: Andrea Gasman M.D.   On: 06/11/2024 22:01   CT CHEST ABDOMEN PELVIS W CONTRAST Result Date: 06/11/2024 CLINICAL DATA:  Sepsis. EXAM: CT CHEST, ABDOMEN, AND PELVIS WITH CONTRAST TECHNIQUE: Multidetector CT imaging of the chest, abdomen and pelvis was performed following the standard protocol during bolus administration of intravenous contrast. RADIATION DOSE REDUCTION: This exam was performed according to the departmental dose-optimization program which includes automated exposure control, adjustment of the mA and/or kV according to patient size and/or use of iterative reconstruction technique. CONTRAST:  75mL OMNIPAQUE  IOHEXOL  350 MG/ML SOLN COMPARISON:  CT abdomen pelvis 11/27/2023 and CT chest 11/09/2022. FINDINGS: CT CHEST FINDINGS Cardiovascular: Atherosclerotic calcification of the aorta, aortic valve and coronary arteries. Heart is enlarged. No pericardial effusion. Mediastinum/Nodes: No pathologically enlarged mediastinal, hilar or axillary lymph nodes. Air in the esophagus can be seen with dysmotility. Lungs/Pleura: Collapse/consolidation in the posterior segment right upper lobe and both lower lobes. Scattered ground-glass and mild septal thickening in the non dependent portions of both lungs. Trace left pleural fluid. Airway is unremarkable. Musculoskeletal: Degenerative changes in the spine. CT ABDOMEN  PELVIS FINDINGS Hepatobiliary: Liver is unremarkable. Cholecystectomy. No biliary ductal dilatation. Pancreas: Negative. Spleen: Negative. Adrenals/Urinary Tract: Adrenal glands are unremarkable. Small bilateral renal stones. Subcentimeter low-attenuation lesions in the left kidney, too small to characterize. No specific follow-up necessary. Ureters are decompressed. High attenuation layers along the posterior bladder wall, new from 11/27/2023. Stomach/Bowel: Stomach, small bowel, appendix and colon are unremarkable. Moderate stool burden. Vascular/Lymphatic: Atherosclerotic calcification of the aorta. No pathologically enlarged lymph nodes. Reproductive: Uterus is visualized.  No adnexal mass. Other: No free fluid.  Mesenteries and peritoneum are unremarkable. Musculoskeletal: Osteopenia.  Degenerative changes in the spine. IMPRESSION: 1. Collapse/consolidation in the posterior segment right upper lobe and both lower lobes, likely a combination aspiration and pneumonia. 2. Trace left pleural fluid. 3. Small bilateral renal stones with layering stone debris in the bladder. 4. Aortic atherosclerosis (ICD10-I70.0). Coronary artery calcification. 5. CT head dictated separately. Electronically Signed   By: Newell Eke M.D.   On: 06/11/2024 21:44   DG Chest Portable 1 View Result Date: 06/11/2024 CLINICAL DATA:  PICC placement. EXAM: PORTABLE CHEST 1 VIEW COMPARISON:  Radiograph earlier today FINDINGS: No central line is seen. There multiple overlying artifacts. The previous ring like opacity projecting over the right lung is not seen and was likely external to the patient. Central vascular engorgement with bilateral ill-defined perihilar opacities. No pneumothorax or large pleural effusion. Heart is normal in size. Aortic atherosclerosis. IMPRESSION: 1. No central line or PICC is seen. 2. Central vascular engorgement with bilateral ill-defined perihilar opacities. This may represent pulmonary edema or atelectasis.  Electronically Signed   By: Andrea Gasman M.D.   On: 06/11/2024 19:57   DG Chest Portable 1 View Result Date: 06/11/2024 CLINICAL DATA:  Shortness of breath and altered mental status EXAM: PORTABLE CHEST 1 VIEW COMPARISON:  Chest radiograph dated 11/27/2023 FINDINGS: Low lung volumes with bronchovascular crowding. Bilateral patchy opacities, right-greater-than-left. Ring-like opacity in the right mid  lung. No pleural effusion or pneumothorax. Enlarged cardiomediastinal silhouette is likely projectional. No acute osseous abnormality. IMPRESSION: 1. Bilateral patchy opacities, right-greater-than-left, may represent atelectasis, aspiration, or pneumonia. 2. Ring-like opacity in the right mid lung, which may be artifactual related to overlying support apparatus or represent a cavitary lesion. Consider repeat chest radiograph with removal of overlying devices. Electronically Signed   By: Limin  Xu M.D.   On: 06/11/2024 15:03     LOS: 2 days   Donalda Applebaum, MD  [facility]    To contact the attending provider between 7A-7P or the covering provider during after hours 7P-7A, please log into the web site [email or web address] and access using universal  password for that web site. If you do not have the password, please call the hospital operator.  06/13/2024, 9:43 AM

## 2024-06-13 NOTE — Plan of Care (Signed)
  Problem: Fluid Volume: Goal: Ability to maintain a balanced intake and output will improve Outcome: Progressing   Problem: Skin Integrity: Goal: Risk for impaired skin integrity will decrease 06/13/2024 1834 by Regino Sayre, RN Outcome: Progressing 06/13/2024 1833 by Regino Sayre, RN Outcome: Progressing   Problem: Clinical Measurements: Goal: Will remain free from infection Outcome: Progressing Goal: Diagnostic test results will improve Outcome: Progressing Goal: Respiratory complications will improve 06/13/2024 1834 by Regino Sayre, RN Outcome: Progressing 06/13/2024 1833 by Regino Sayre, RN Outcome: Progressing Goal: Cardiovascular complication will be avoided Outcome: Progressing

## 2024-06-13 NOTE — Consult Note (Signed)
 Consultation Note Date: 06/13/2024   Patient Name: Claudia Keller  DOB: [birth year]  MRN: [id number]  Age / Sex: 73 y.o., female  PCP: Iver Elsie HERO, MD Referring Physician: Raenelle Donalda HERO, MD  Reason for Consultation: Establishing goals of care  HPI/Patient Profile: 73 y.o. female  with past medical history of CVA with residual hemiplegia, advanced dementia, essential hypertension, hypothyroidism, hyperlipidemia, GERD, CVA, atrial fibrillation, CKD stage III, DM type II, pericardial effusion, congestive heart failure, gout, depression, anxiety, bipolar disorder, PTSD, COPD, morbid obesity, IBS, history of GI bleed in January 2025 not on anticoagulation anymore and prolonged QTc interval  admitted on 06/11/2024 with hypoxia and altered mental status at SNF/LTC.   [facility] reported abnormal labs and patient non-verbal when she can typically talk at baseline. She was being treated for pneumonia. In the ED, she had signs of sepsis with acute hypoxic respiratory failure and workup showed evidence of bilateral PNA with concern for cavitary lesion, as well as UTI, electrolyte disturbances, and AKI on CKD3a. She is NPO due to oropharyngeal dysphagia with poor prognosis and poor candidacy for feeding tube. MBS not anticipated to change outcome.  PMT has been consulted to assist with goals of care conversation.  Clinical Assessment and Goals of Care:  I have reviewed medical records including EPIC notes, labs and imaging, discussed with RN, assessed the patient and then met at the bedside with patient's son/HCPOA to discuss diagnosis prognosis, GOC, EOL wishes, disposition and options.  I introduced Palliative Medicine as specialized medical care for people living with serious illness. It focuses on providing relief from the symptoms and stress of a serious illness. The goal is to improve quality of life for both the patient and the family.  We discussed a brief life  review of the patient and then focused on their current illness. The expectations and natural trajectory at EOL were discussed.   I attempted to elicit values and goals of care important to the patient.    Medical History Review and Understanding:  We discussed patient's acute illness in the context of their chronic comorbidities. Provided extensive education on the progressive and irreversible nature of dementia with emphasis on associated complications and prognosis. Patient's son, daughter, and daughter's friend understand the severity of patient's illness.  Social History: Patient resides at LTC facility. She is divorced has 1 son and 2 daughters. Per previous conversations with PMT, also had another son who was placed for adoption.  Functional and Nutritional State: Patient has poor function status and concern for failure to thrive . Albumin  of 2 on 06/11/24.  Advance Directives: A detailed discussion regarding advanced directives was had. Reviewed documentation currently on file. Patient would want to donate her eyes only and did not indicate any other care preferences. Son Claudia Keller is HCPOA.  Code Status: Patient has historically wanted full code status. Her son reflected on the recent decision for DNR/DNI given that in her current condition this would cause more harm than benefit.  Discussion: Initially visited with patient's son Claudia Keller, who is a Child psychotherapist with some experience of palliative care and hospice. He is processing the conversation he had with patient's doctor this morning and has just called his sister to inform her as well. This is all a lot to take in right now and he is also dealing with his son's overdose yesterday. He does not want his mother to suffer and did not fully understand why she is not a candidate for a feeding tube.  Reviewed the  risks of prolonged suffering, counseling that a feeding tube would not stop her from aspirating on her oral secretions or alter her  prognosis. Provided education on relationship between dementia and dysphagia without any likelihood of improvement. She is at high risk for recurrent hospitalizations and worsening quality of life even in the best case scenario that she recovers from this acute illness enough to return to LTC. I reviewed this information with patient's daughter Alberta when she arrived as well. Emotional support and therapeutic listening was provided. Patient's family would like to continue discussing their options and revisit GOC discussions tomorrow.   The difference between aggressive medical intervention and comfort care was considered in light of the patient's goals of care. Hospice and Palliative Care services outpatient were explained and offered.   Discussed the importance of continued conversation with family and the medical providers regarding overall plan of care and treatment options, ensuring decisions are within the context of the patient's values and GOCs.   Questions and concerns were addressed.  Hard Choices booklet left for review. The family was encouraged to call with questions or concerns.  PMT will continue to support holistically.   SUMMARY OF RECOMMENDATIONS   -Continue DNR/DNI -Continue current care plan for now. Family understands poor prognosis and limited options for long-term treatment; they would like time to process recommendations for comfort care/hospice before making a final decision -Ongoing GOC discussions -Psychosocial and emotional support provided -PMT will continue to follow and support  Prognosis:  Very poor prognosis   Discharge Planning: To Be Determined      Primary Diagnoses: Present on Admission:  Hypernatremia  Acute hypoxic respiratory failure (HCC)  Acute kidney injury superimposed on CKD (HCC)  GERD (gastroesophageal reflux disease)  Acute metabolic encephalopathy  History of pericardial effusion  Anxiety  Dementia (HCC)  PTSD (post-traumatic stress  disorder)  Bipolar disorder (HCC)  Paroxysmal atrial fibrillation (HCC)   Physical Exam Vitals and nursing note reviewed.  Constitutional:      General: She is not in acute distress.    Appearance: She is ill-appearing.     Interventions: Nasal cannula in place.  Cardiovascular:     Rate and Rhythm: Normal rate.  Pulmonary:     Effort: Pulmonary effort is normal.  Skin:    General: Skin is warm and dry.  Neurological:     Mental Status: She is lethargic.  Psychiatric:        Speech: She is noncommunicative.        Cognition and Memory: Cognition is impaired.    Vital Signs: BP (!) 119/56 (BP Location: Left Arm)   Pulse 75   Temp (!) 97.5 F (36.4 C) (Axillary)   Resp (!) 22   Ht 5' 4 (1.626 m)   Wt 95 kg   SpO2 93%   BMI 35.95 kg/m  Pain Scale: PAINAD   Pain Score: Asleep   SpO2: SpO2: 93 % O2 Device:SpO2: 93 % O2 Flow Rate: .O2 Flow Rate (L/min): 3 L/min   Palliative Assessment/Data: 10%    Total time: I spent 80 minutes in the care of the patient today in the above activities and documenting the encounter.  MDM: High   Danashia Landers SHAUNNA Fell, PA-C  Palliative Medicine Team Team phone # [phone]  Thank you for allowing the Palliative Medicine Team to assist in the care of this patient. Please utilize secure chat with additional questions, if there is no response within 30 minutes please call the above phone number.  Palliative  Medicine Team providers are available by phone from 7am to 7pm daily and can be reached through the team cell phone.  Should this patient require assistance outside of these hours, please call the patient's attending physician.

## 2024-06-13 NOTE — TOC Initial Note (Signed)
 Transition of Care [facility]) - Initial/Assessment Note    Patient Details  Name: Claudia Keller MRN: [id number] Date of Birth: 1951/01/11  [facility] CM/SW Contact:    Inocente GORMAN Kindle, LCSW Phone Number: 06/13/2024, 8:55 AM  Clinical Narrative:                 Patient admitted from [facility] under long term care. CSW will continue to follow.   Expected Discharge Plan: Skilled Nursing Facility Barriers to Discharge: Continued Medical Work up   Patient Goals and CMS Choice            Expected Discharge Plan and Services In-house Referral: Clinical Social Work     Living arrangements for the past 2 months: Skilled Nursing Facility                                      Prior Living Arrangements/Services Living arrangements for the past 2 months: Skilled Nursing Facility Lives with:: Facility Resident Patient language and need for interpreter reviewed:: Yes Do you feel safe going back to the place where you live?: Yes      Need for Family Participation in Patient Care: Yes (Comment) Care giver support system in place?: Yes (comment)   Criminal Activity/Legal Involvement Pertinent to Current Situation/Hospitalization: No - Comment as needed  Activities of Daily Living      Permission Sought/Granted Permission sought to share information with : Facility Medical sales representative, Family Supports Permission granted to share information with : No  Share Information with NAME: Arva Garen Radar [phone]  Permission granted to share info w AGENCY: [facility]        Emotional Assessment Appearance:: Appears stated age Attitude/Demeanor/Rapport: Unable to Assess Affect (typically observed): Unable to Assess Orientation: : Oriented to Self Alcohol  / Substance Use: Not Applicable Psych Involvement: No (comment)  Admission diagnosis:  Delirium [R41.0] Hypernatremia [E87.0] Sepsis (HCC) [A41.9] Patient Active Problem List   Diagnosis Date Noted    Sepsis secondary to aspiration pneumonia and UTI (HCC) 06/11/2024   Hypokalemia 06/11/2024   Hyperlipidemia 06/11/2024   Hypercalcemia 06/11/2024   History of dementia 06/11/2024   Chronic diastolic CHF (congestive heart failure) (HCC) 06/11/2024   Depression 06/11/2024   History of COPD 06/11/2024   History of GI bleed 06/11/2024   Thrombocytopenia (HCC) 06/11/2024   Acute cystitis 06/11/2024   Hematemesis 11/27/2023   History of gout 03/01/2022   Avitaminosis D 02/22/2022   Unspecified protein-calorie malnutrition (HCC) 11/12/2021   Acute metabolic encephalopathy 11/09/2021   Paroxysmal atrial fibrillation (HCC) 11/09/2021   History of pericardial effusion    (HFpEF) heart failure with preserved ejection fraction (HCC) 11/05/2021   History of pericarditis 11/05/2021   Dementia (HCC) 11/05/2021   CKD (chronic kidney disease) stage 3, GFR 30-59 ml/min (HCC) 11/05/2021   Prolonged QT interval 11/05/2021   B12 deficiency 09/04/2021   History of CVA (cerebrovascular accident) 09/02/2021   Hypoalbuminemia 12/07/2020   Obesity (BMI 30-39.9) 12/07/2020   COPD (chronic obstructive pulmonary disease) (HCC) 12/07/2020   GERD (gastroesophageal reflux disease) 12/07/2020   Diabetic neuropathy (HCC) 12/07/2020   Anxiety 12/07/2020   Acute hypoxic respiratory failure (HCC) 12/01/2020   Segmental colitis without complication (HCC)    Ischemic colitis (HCC) 03/19/2020   Acute kidney injury superimposed on CKD (HCC) 03/18/2020   PTSD (post-traumatic stress disorder) 04/17/2018   Hypernatremia 11/06/2017   Allergic rhinitis 11/06/2016   Bipolar  disorder (HCC) 11/05/2016   Dyslipidemia 11/05/2016   MDD (major depressive disorder), recurrent severe, without psychosis (HCC) 08/18/2016   Personal history of noncompliance with medical treatment, presenting hazards to health 08/17/2016   Emesis 05/20/2016   Insulin  dependent type 2 diabetes mellitus (HCC) 09/08/2015   Essential hypertension,  benign 09/08/2015   Primary hypothyroidism 09/08/2015   Diverticulosis of colon without hemorrhage    Tobacco abuse 06/24/2011   Irritable bowel syndrome 11/12/2009   History of colonic polyps 11/12/2009   Closed fracture of metatarsal bone 11/30/2007   PCP:  Iver Elsie HERO, MD Pharmacy:  No Pharmacies Listed    Social Drivers of Health (SDOH) Social History: SDOH Screenings   Food Insecurity: No Food Insecurity (11/28/2023)  Housing: Unknown (11/28/2023)  Transportation Needs: No Transportation Needs (11/28/2023)  Utilities: Not At Risk (11/28/2023)  Depression (PHQ2-9): Low Risk  (12/31/2021)  Social Connections: Unknown (11/28/2023)  Tobacco Use: High Risk (06/11/2024)   SDOH Interventions:     Readmission Risk Interventions     No data to display

## 2024-06-13 NOTE — Consult Note (Signed)
 WOC Nurse Consult Note: Reason for Consult: Stage 3 Pressure Injury  Wound type: Stage 3 Pressure Injury Sacrum  Pressure Injury POA: Yes Measurement: see nursing flowsheet  Wound bed:80% red 20% tan  Drainage (amount, consistency, odor) tan exudate on old dressing  Periwound: moisture associated skin damage noted to buttocks  Dressing procedure/placement/frequency: Cleanse sacral wound with NS, apply Medihoney to wound bed daily, cover with dry gauze and secure with silicone foam or ABD pad whichever is preferred.   Will write for Gerhart's Butt Cream to buttocks 2 times a day and prn soiling.   POC discussed with bedside nurse. WOC team will not follow. Re-consult if further needs arise.   Thank you,    Eddings Bar MSN, RN-BC, [employer]

## 2024-06-13 NOTE — Progress Notes (Signed)
 Speech Language Pathology Treatment: Dysphagia  Patient Details Name: Claudia Keller MRN: [id number] DOB: December 12, 1950 Today's Date: 06/13/2024 Time: 0750-0820 SLP Time Calculation (min) (ACUTE ONLY): 30 min  Assessment / Plan / Recommendation Clinical Impression  Patient seen to assess for readiness for instrumental swallow evaluation and/or po intake. Today she was lethargic upon walking into her room - but did awaken to verbal/tactile stimulation and accepted oral care *SLP set up suction* and po offerings. She will open her mouth to accept po but with right labial spillage, lingual thrusting due to facial and hypoglossal nerve deficits. At times, pt does not elicit swallow - most notably with italian ice and tsp of thin despite total verbal, tactile and visual cues. She demonstrated overt coughing with thin and nectar liquid - and subjectively multiple delayed swallows with applesauce. She does readily open her mouth to take more applesauce - and is coughing with liquids - with appearance of mild discomfort with cough. Do not anticipate MBS will change this pt's outcomes based on clinical evaluation. Suspect patient's dysphagia is due to her dementia impacting oral function and prior basal ganglia CVA impacting mostly pharyngeal function are causing her to have a dysfunctional swallow without ability to meet nutritional nor hydration needs. In addition, pt has h/o hiatal hernia, gastritis from most recent endoscopy. If aligns with family goals, comfort feeding may be appropriate for this patient. Messaged MD re: concerns and await his guidance. Informed RN of plan and called xray to cancel MBS currently. Will follow up to help with her care plan as MD indicates. Thanks for allow this SLP to help with pt's care plan.   HPI HPI: Deondria T Cato is a 73 yo female with PMH of HTN, dementia, GERD, prior CVA with residual hemiplegia, A-fib not on anticoagulation, T2DM, CHF, gout, depression/anxiety, PTSD, COPD,  history of GI bleed (January 2025), CKD 3A, pericardial effusion. She presents from [facility] SNF to ED 8/4 with lethargy and hypoxia. CT shows collapse/consolidation in the posterior segment R upper lobe and both lower lobes, likely a combination of aspiration and pneumonia. Pt's family reports she typically consumes a pureed diet with thin liquids and aspiration precautions. Pt seen by SLP October 2022 after failing the [facility] s/p bilateral basal ganglia stroke with recommendations for Dys 2 solids with nectar thick liquids. Per chart review, instrumental testing was not performed.      SLP Plan  Continue with current plan of care          Recommendations  Diet recommendations:  (consider comfort po -) Medication Administration: Crushed with puree                  Oral care QID     Dysphagia, oropharyngeal phase (R13.12)     Continue with current plan of care   Madelin POUR, MS [facility] SLP Acute Rehab Services Office [phone]   [name]  06/13/2024, 8:56 AM

## 2024-06-14 DIAGNOSIS — Z7189 Other specified counseling: Secondary | ICD-10-CM | POA: Diagnosis not present

## 2024-06-14 DIAGNOSIS — I480 Paroxysmal atrial fibrillation: Secondary | ICD-10-CM | POA: Diagnosis not present

## 2024-06-14 DIAGNOSIS — E119 Type 2 diabetes mellitus without complications: Secondary | ICD-10-CM | POA: Diagnosis not present

## 2024-06-14 DIAGNOSIS — N179 Acute kidney failure, unspecified: Secondary | ICD-10-CM | POA: Diagnosis not present

## 2024-06-14 DIAGNOSIS — E870 Hyperosmolality and hypernatremia: Secondary | ICD-10-CM | POA: Diagnosis not present

## 2024-06-14 DIAGNOSIS — Z515 Encounter for palliative care: Secondary | ICD-10-CM | POA: Diagnosis not present

## 2024-06-14 DIAGNOSIS — A419 Sepsis, unspecified organism: Secondary | ICD-10-CM | POA: Diagnosis not present

## 2024-06-14 DIAGNOSIS — J9601 Acute respiratory failure with hypoxia: Secondary | ICD-10-CM | POA: Diagnosis not present

## 2024-06-14 LAB — BASIC METABOLIC PANEL WITH GFR
Anion gap: 9 (ref 5–15)
BUN: 15 mg/dL (ref 8–23)
CO2: 24 mmol/L (ref 22–32)
Calcium: 8.7 mg/dL — ABNORMAL LOW (ref 8.9–10.3)
Chloride: 117 mmol/L — ABNORMAL HIGH (ref 98–111)
Creatinine, Ser: 1.12 mg/dL — ABNORMAL HIGH (ref 0.44–1.00)
GFR, Estimated: 53 mL/min — ABNORMAL LOW (ref >60)
Glucose, Bld: 163 mg/dL — ABNORMAL HIGH (ref 70–99)
Potassium: 3.6 mmol/L (ref 3.5–5.1)
Sodium: 150 mmol/L — ABNORMAL HIGH (ref 135–145)

## 2024-06-14 LAB — CBC
HCT: 25.6 % — ABNORMAL LOW (ref 36.0–46.0)
Hemoglobin: 8.1 g/dL — ABNORMAL LOW (ref 12.0–15.0)
MCH: 31.8 pg (ref 26.0–34.0)
MCHC: 31.6 g/dL (ref 30.0–36.0)
MCV: 100.4 fL — ABNORMAL HIGH (ref 80.0–100.0)
Platelets: 37 K/uL — ABNORMAL LOW (ref 150–400)
RBC: 2.55 MIL/uL — ABNORMAL LOW (ref 3.87–5.11)
RDW: 18.5 % — ABNORMAL HIGH (ref 11.5–15.5)
WBC: 8.6 K/uL (ref 4.0–10.5)
nRBC: 0 % (ref 0.0–0.2)

## 2024-06-14 LAB — GLUCOSE, CAPILLARY: Glucose-Capillary: 179 mg/dL — ABNORMAL HIGH (ref 70–99)

## 2024-06-14 MED ORDER — ACETAMINOPHEN 325 MG PO TABS: 650.0000 mg | ORAL_TABLET | Freq: Four times a day (QID) | ORAL | Status: DC | PRN

## 2024-06-14 MED ORDER — ACETAMINOPHEN 650 MG RE SUPP: 650.0000 mg | Freq: Four times a day (QID) | RECTAL | Status: DC | PRN

## 2024-06-14 MED ORDER — GLYCOPYRROLATE 1 MG PO TABS: 1.0000 mg | ORAL_TABLET | ORAL | Status: DC | PRN

## 2024-06-14 MED ORDER — BIOTENE DRY MOUTH MT LIQD: 15.0000 mL | OROMUCOSAL | Status: DC | PRN

## 2024-06-14 MED ORDER — SODIUM CHLORIDE 0.45 % IV SOLN: INTRAVENOUS | Status: DC

## 2024-06-14 MED ORDER — MORPHINE SULFATE (PF) 2 MG/ML IV SOLN: 1.0000 mg | INTRAVENOUS | Status: DC | PRN

## 2024-06-14 MED ORDER — GLYCOPYRROLATE 0.2 MG/ML IJ SOLN: 0.2000 mg | INTRAMUSCULAR | Status: DC | PRN

## 2024-06-14 MED ORDER — MIDAZOLAM HCL 2 MG/2ML IJ SOLN: 1.0000 mg | INTRAMUSCULAR | Status: DC | PRN

## 2024-06-14 MED ORDER — POLYVINYL ALCOHOL 1.4 % OP SOLN: 1.0000 [drp] | Freq: Four times a day (QID) | OPHTHALMIC | Status: DC | PRN

## 2024-06-14 NOTE — Care Management Important Message (Signed)
 Important Message  Patient Details  Name: Claudia Keller MRN: [id number] Date of Birth: 1951-02-17   Important Message Given:  Yes - Medicare IM     Claretta Deed 06/14/2024, 4:19 PM

## 2024-06-14 NOTE — Progress Notes (Signed)
 SLP Cancellation Note  Patient Details Name: Claudia Keller MRN: [id number] DOB: 17-May-1951   Cancelled treatment:       Reason Eval/Treat Not Completed: Other (comment) (pt lethargic and did not awaken to verbal/tactile stimulation, palliative meeting pending today per chart)  Madelin POUR, MS [facility] SLP Acute Rehab Services Office [phone]  [name] 06/14/2024, 7:58 AM

## 2024-06-14 NOTE — Plan of Care (Signed)
  Problem: Education: Goal: Ability to describe self-care measures that may prevent or decrease complications (Diabetes Survival Skills Education) will improve Outcome: Progressing   Problem: Coping: Goal: Ability to adjust to condition or change in health will improve Outcome: Progressing   Problem: Metabolic: Goal: Ability to maintain appropriate glucose levels will improve Outcome: Progressing   Problem: Nutritional: Goal: Maintenance of adequate nutrition will improve Outcome: Progressing   Problem: Safety: Goal: Non-violent Restraint(s) Outcome: Progressing

## 2024-06-14 NOTE — Progress Notes (Signed)
 Palliative Medicine Progress Note   Patient Name: Claudia Keller       Date: 06/14/2024 DOB: 17-Jan-1951  Age: 73 y.o. MRN#: [id number] Attending Physician: Raenelle Donalda HERO, MD Primary Care Physician: Iver Elsie HERO, MD Admit Date: 06/11/2024   HPI/Patient Profile: 73 y.o. female  with past medical history of CVA with residual hemiplegia, advanced dementia, essential hypertension, hypothyroidism, hyperlipidemia, GERD, CVA, atrial fibrillation, CKD stage III, DM type II, pericardial effusion, congestive heart failure, gout, depression, anxiety, bipolar disorder, PTSD, COPD, morbid obesity, IBS, history of GI bleed in January 2025 not on anticoagulation anymore and prolonged QTc interval  admitted on 06/11/2024 with hypoxia and altered mental status at SNF/LTC.    [facility] reported abnormal labs and patient non-verbal when she can typically talk at baseline. She was being treated for pneumonia. In the ED, she had signs of sepsis with acute hypoxic respiratory failure and workup showed evidence of bilateral PNA with concern for cavitary lesion, as well as UTI, electrolyte disturbances, and AKI on CKD3a. She is NPO due to oropharyngeal dysphagia with poor prognosis and poor candidacy for feeding tube. MBS not anticipated to change outcome.   PMT has been consulted to assist with goals of care conversation.  Subjective: Extensive chart review has been completed including labs, vital signs, imaging, progress/consult notes, orders, medications and available advance directive documents.   Patient assessed. She is calm, in no distress, oriented to self only. Not able to participate in conversation.      I met with son/Claudia Keller at bedside as a follow-up for palliative needs and emotional support. Ongoing  conversation was had regarding patient's current medical situation specific to dementia, aspiration pneumonia, and failure to thrive . We reviewed the limitations of medical interventions to prolong quality of life when the body fails to thrive .  We reviewed the concept of a comfort path as de-escalating (and/or not starting) full scope medical interventions, allowing a natural course to occur. Reviewed that the goal is comfort and dignity rather than prolonging life. Discussed transitioning to comfort care while in the hospital, and what that would look like--keeping her clean and dry, no labs, no artificial hydration or feeding, no antibiotics, minimizing of medications, comfort feeds, and medication for pain and dyspnea as needed.   Son acknowledges he is struggling to make decisions, and requested additional  time to process the situation.  14:32 - Son calls PMT team phone, requesting a call back. When I returned his call, he states that after our discussion this morning, and with further discussion with his sister, they have decided to proceed with transition to comfort care.  They are also interested in transfer to inpatient hospice, requesting the facility in [facility].  All questions and concerns were addressed.   Objective:  Physical Exam Vitals reviewed.  Constitutional:      General: She is not in acute distress.    Appearance: She is ill-appearing.  Pulmonary:     Effort: No respiratory distress.  Neurological:     Mental Status: She is lethargic and confused.     Motor: Weakness present.  Psychiatric:        Cognition and Memory: Cognition is impaired. Memory is impaired.            Palliative Medicine Assessment & Plan   Assessment: Principal Problem:   Sepsis secondary to aspiration pneumonia and UTI (HCC) Active Problems:   Insulin  dependent type 2 diabetes mellitus (HCC)   PTSD (post-traumatic stress disorder)   Acute kidney injury superimposed on CKD (HCC)    Acute hypoxic respiratory failure (HCC)   GERD (gastroesophageal reflux disease)   Anxiety   History of CVA (cerebrovascular accident)   Dementia (HCC)   History of pericardial effusion   Acute metabolic encephalopathy   Paroxysmal atrial fibrillation (HCC)   Bipolar disorder (HCC)   Hypernatremia   Hypokalemia   Hyperlipidemia   Hypercalcemia   History of dementia   Chronic diastolic CHF (congestive heart failure) (HCC)   Depression   History of COPD   History of GI bleed   Thrombocytopenia (HCC)   Acute cystitis    Recommendations/Plan: Transition to comfort care Referral to inpatient hospice facility in [facility] Discontinue labs, cardiac monitoring, CBG checks, IV fluids, and antibiotics PRN medications are available as per below for symptom management at end-of-life PMT will continue to support  Symptom Management:  Morphine  prn for pain or dyspnea Midazolam  (Versed ) prn for agitation Glycopyrrolate  (ROBINUL ) for excessive secretions Ondansetron  (ZOFRAN ) prn for nausea Polyvinyl alcohol  (LIQUIFILM TEARS) prn for dry eyes Antiseptic oral rinse (BIOTENE) prn for dry mouth   Code Status: DNR - comfort   Prognosis:  < 2 weeks  Discharge Planning: Hospice facility  Care plan was discussed with Dr. Raenelle, RN, and [facility]  Thank you for allowing the Palliative Medicine Team to assist in the care of this patient.   Time: 65 minutes  Detailed review of medical records (labs, imaging, vital signs), medically appropriate exam, discussed with treatment team, counseling and education to patient, family, & staff, documenting clinical information, medication management, coordination of care.    Corwyn Vora B Estanislado Surgeon, NP   Please contact Palliative Medicine Team phone at [phone] for questions and concerns.  For individual providers, please see AMION.

## 2024-06-14 NOTE — Plan of Care (Signed)
 Problem: Education: Goal: Ability to describe self-care measures that may prevent or decrease complications (Diabetes Survival Skills Education) will improve 06/14/2024 0409 by Baldwin Doran BIRCH, RN Outcome: Not Progressing 06/14/2024 0352 by Baldwin Doran BIRCH, RN Outcome: Not Progressing Goal: Individualized Educational Video(s) 06/14/2024 0409 by Baldwin Doran BIRCH, RN Outcome: Not Progressing 06/14/2024 0352 by Baldwin Doran BIRCH, RN Outcome: Not Progressing   Problem: Coping: Goal: Ability to adjust to condition or change in health will improve 06/14/2024 0409 by Baldwin Doran BIRCH, RN Outcome: Not Progressing 06/14/2024 0352 by Baldwin Doran BIRCH, RN Outcome: Not Progressing   Problem: Fluid Volume: Goal: Ability to maintain a balanced intake and output will improve 06/14/2024 0409 by Baldwin Doran BIRCH, RN Outcome: Not Progressing 06/14/2024 0352 by Baldwin Doran BIRCH, RN Outcome: Not Progressing   Problem: Health Behavior/Discharge Planning: Goal: Ability to identify and utilize available resources and services will improve 06/14/2024 0409 by Baldwin Doran BIRCH, RN Outcome: Not Progressing 06/14/2024 0352 by Baldwin Doran BIRCH, RN Outcome: Not Progressing Goal: Ability to manage health-related needs will improve 06/14/2024 0409 by Baldwin Doran BIRCH, RN Outcome: Not Progressing 06/14/2024 0352 by Baldwin Doran BIRCH, RN Outcome: Not Progressing   Problem: Metabolic: Goal: Ability to maintain appropriate glucose levels will improve 06/14/2024 0409 by Baldwin Doran BIRCH, RN Outcome: Not Progressing 06/14/2024 0352 by Baldwin Doran BIRCH, RN Outcome: Not Progressing   Problem: Nutritional: Goal: Maintenance of adequate nutrition will improve 06/14/2024 0409 by Baldwin Doran BIRCH, RN Outcome: Not Progressing 06/14/2024 0352 by Baldwin Doran BIRCH, RN Outcome: Not Progressing Goal: Progress toward achieving an optimal weight will improve 06/14/2024 0409 by Baldwin Doran BIRCH, RN Outcome: Not  Progressing 06/14/2024 0352 by Baldwin Doran BIRCH, RN Outcome: Not Progressing   Problem: Skin Integrity: Goal: Risk for impaired skin integrity will decrease 06/14/2024 0409 by Baldwin Doran BIRCH, RN Outcome: Not Progressing 06/14/2024 0352 by Baldwin Doran BIRCH, RN Outcome: Not Progressing   Problem: Tissue Perfusion: Goal: Adequacy of tissue perfusion will improve 06/14/2024 0409 by Baldwin Doran BIRCH, RN Outcome: Not Progressing 06/14/2024 0352 by Baldwin Doran BIRCH, RN Outcome: Not Progressing   Problem: Safety: Goal: Non-violent Restraint(s) 06/14/2024 0409 by Baldwin Doran BIRCH, RN Outcome: Not Progressing 06/14/2024 0352 by Baldwin Doran BIRCH, RN Outcome: Not Progressing   Problem: Education: Goal: Knowledge of General Education information will improve Description: Including pain rating scale, medication(s)/side effects and non-pharmacologic comfort measures 06/14/2024 0409 by Baldwin Doran BIRCH, RN Outcome: Not Progressing 06/14/2024 0352 by Baldwin Doran BIRCH, RN Outcome: Not Progressing   Problem: Health Behavior/Discharge Planning: Goal: Ability to manage health-related needs will improve 06/14/2024 0409 by Baldwin Doran BIRCH, RN Outcome: Not Progressing 06/14/2024 0352 by Baldwin Doran BIRCH, RN Outcome: Not Progressing   Problem: Clinical Measurements: Goal: Ability to maintain clinical measurements within normal limits will improve 06/14/2024 0409 by Baldwin Doran BIRCH, RN Outcome: Not Progressing 06/14/2024 0352 by Baldwin Doran BIRCH, RN Outcome: Not Progressing Goal: Will remain free from infection 06/14/2024 0409 by Baldwin Doran BIRCH, RN Outcome: Not Progressing 06/14/2024 0352 by Baldwin Doran BIRCH, RN Outcome: Not Progressing Goal: Diagnostic test results will improve 06/14/2024 0409 by Baldwin Doran BIRCH, RN Outcome: Not Progressing 06/14/2024 0352 by Baldwin Doran BIRCH, RN Outcome: Not Progressing Goal: Respiratory complications will improve 06/14/2024 0409 by Baldwin Doran BIRCH, RN Outcome: Not Progressing 06/14/2024 0352 by Baldwin Doran BIRCH, RN Outcome: Not Progressing Goal: Cardiovascular complication will be avoided 06/14/2024 0409 by Baldwin Doran BIRCH, RN Outcome: Not Progressing 06/14/2024 0352 by Baldwin Doran  D, RN Outcome: Not Progressing   Problem: Activity: Goal: Risk for activity intolerance will decrease 06/14/2024 0409 by Baldwin Doran BIRCH, RN Outcome: Not Progressing 06/14/2024 0352 by Baldwin Doran BIRCH, RN Outcome: Not Progressing   Problem: Nutrition: Goal: Adequate nutrition will be maintained 06/14/2024 0409 by Baldwin Doran BIRCH, RN Outcome: Not Progressing 06/14/2024 0352 by Baldwin Doran BIRCH, RN Outcome: Not Progressing   Problem: Coping: Goal: Level of anxiety will decrease 06/14/2024 0409 by Baldwin Doran BIRCH, RN Outcome: Not Progressing 06/14/2024 0352 by Baldwin Doran BIRCH, RN Outcome: Not Progressing   Problem: Elimination: Goal: Will not experience complications related to bowel motility 06/14/2024 0409 by Baldwin Doran BIRCH, RN Outcome: Not Progressing 06/14/2024 0352 by Baldwin Doran BIRCH, RN Outcome: Not Progressing Goal: Will not experience complications related to urinary retention 06/14/2024 0409 by Baldwin Doran BIRCH, RN Outcome: Not Progressing 06/14/2024 0352 by Baldwin Doran BIRCH, RN Outcome: Not Progressing   Problem: Pain Managment: Goal: General experience of comfort will improve and/or be controlled 06/14/2024 0409 by Baldwin Doran BIRCH, RN Outcome: Not Progressing 06/14/2024 0352 by Baldwin Doran BIRCH, RN Outcome: Not Progressing   Problem: Safety: Goal: Ability to remain free from injury will improve 06/14/2024 0409 by Baldwin Doran BIRCH, RN Outcome: Not Progressing 06/14/2024 0352 by Baldwin Doran BIRCH, RN Outcome: Not Progressing   Problem: Skin Integrity: Goal: Risk for impaired skin integrity will decrease 06/14/2024 0409 by Baldwin Doran BIRCH, RN Outcome: Not Progressing 06/14/2024 0352 by Baldwin Doran BIRCH, RN Outcome: Not Progressing   Problem: Activity: Goal: Ability to tolerate increased activity will improve 06/14/2024 0409 by Baldwin Doran BIRCH, RN Outcome: Not Progressing 06/14/2024 0352 by Baldwin Doran BIRCH, RN Outcome: Not Progressing   Problem: Clinical Measurements: Goal: Ability to maintain a body temperature in the normal range will improve 06/14/2024 0409 by Baldwin Doran BIRCH, RN Outcome: Not Progressing 06/14/2024 0352 by Baldwin Doran BIRCH, RN Outcome: Not Progressing   Problem: Respiratory: Goal: Ability to maintain adequate ventilation will improve 06/14/2024 0409 by Baldwin Doran BIRCH, RN Outcome: Not Progressing 06/14/2024 0352 by Baldwin Doran BIRCH, RN Outcome: Not Progressing Goal: Ability to maintain a clear airway will improve 06/14/2024 0409 by Baldwin Doran BIRCH, RN Outcome: Not Progressing 06/14/2024 0352 by Baldwin Doran BIRCH, RN Outcome: Not Progressing

## 2024-06-14 NOTE — Plan of Care (Signed)
  Problem: Education: Goal: Ability to describe self-care measures that may prevent or decrease complications (Diabetes Survival Skills Education) will improve Outcome: Not Progressing Goal: Individualized Educational Video(s) Outcome: Not Progressing   Problem: Coping: Goal: Ability to adjust to condition or change in health will improve Outcome: Not Progressing   Problem: Fluid Volume: Goal: Ability to maintain a balanced intake and output will improve Outcome: Not Progressing   Problem: Health Behavior/Discharge Planning: Goal: Ability to identify and utilize available resources and services will improve Outcome: Not Progressing Goal: Ability to manage health-related needs will improve Outcome: Not Progressing   Problem: Metabolic: Goal: Ability to maintain appropriate glucose levels will improve Outcome: Not Progressing   Problem: Nutritional: Goal: Maintenance of adequate nutrition will improve Outcome: Not Progressing Goal: Progress toward achieving an optimal weight will improve Outcome: Not Progressing   Problem: Skin Integrity: Goal: Risk for impaired skin integrity will decrease Outcome: Not Progressing   Problem: Tissue Perfusion: Goal: Adequacy of tissue perfusion will improve Outcome: Not Progressing   Problem: Safety: Goal: Non-violent Restraint(s) Outcome: Not Progressing   Problem: Education: Goal: Knowledge of General Education information will improve Description: Including pain rating scale, medication(s)/side effects and non-pharmacologic comfort measures Outcome: Not Progressing   Problem: Health Behavior/Discharge Planning: Goal: Ability to manage health-related needs will improve Outcome: Not Progressing   Problem: Clinical Measurements: Goal: Ability to maintain clinical measurements within normal limits will improve Outcome: Not Progressing Goal: Will remain free from infection Outcome: Not Progressing Goal: Diagnostic test results will  improve Outcome: Not Progressing Goal: Respiratory complications will improve Outcome: Not Progressing Goal: Cardiovascular complication will be avoided Outcome: Not Progressing   Problem: Activity: Goal: Risk for activity intolerance will decrease Outcome: Not Progressing   Problem: Nutrition: Goal: Adequate nutrition will be maintained Outcome: Not Progressing   Problem: Coping: Goal: Level of anxiety will decrease Outcome: Not Progressing   Problem: Elimination: Goal: Will not experience complications related to bowel motility Outcome: Not Progressing Goal: Will not experience complications related to urinary retention Outcome: Not Progressing   Problem: Pain Managment: Goal: General experience of comfort will improve and/or be controlled Outcome: Not Progressing   Problem: Safety: Goal: Ability to remain free from injury will improve Outcome: Not Progressing   Problem: Skin Integrity: Goal: Risk for impaired skin integrity will decrease Outcome: Not Progressing   Problem: Activity: Goal: Ability to tolerate increased activity will improve Outcome: Not Progressing   Problem: Clinical Measurements: Goal: Ability to maintain a body temperature in the normal range will improve Outcome: Not Progressing   Problem: Respiratory: Goal: Ability to maintain adequate ventilation will improve Outcome: Not Progressing Goal: Ability to maintain a clear airway will improve Outcome: Not Progressing

## 2024-06-14 NOTE — Progress Notes (Addendum)
 PROGRESS NOTE        PATIENT DETAILS Name: Claudia Keller Age: 73 y.o. Sex: female Date of Birth: 04-30-1951 Admit Date: 06/11/2024 Admitting Physician Micaela Speaker, MD ERE:Izfjmryp, Elsie HERO, MD  Brief Summary: Patient is a 73 y.o.  female with history of dementia, CVA, CKD stage IIIa, DM-2, HFpEF, anxiety/depression-who presented from SNF with lethargy/hypoxemia-she was found to have acute hypoxic respiratory failure secondary to aspiration pneumonia and was found to have severe hypernatremia.  Significant events: 8/4>> admit to [facility]  Significant studies: 8/4>> CT head: No acute intracranial abnormality 8/4>> CT chest/abdomen/pelvis: B/L consolidation both lower lobes, right upper lobe. 8/5>> echo: EF 60-65%  Significant microbiology data: 8/4>> COVID/influenza/RSV PCR: Negative 8/4>> urine culture: E. coli/Enterococcus/procidentia. 8/4>> blood culture: Negative  Procedures: None  Consults: None  Subjective: Sleepy-mumbles/groans.  No major issues overnight.  Objective: Vitals: Blood pressure (!) 113/54, pulse 89, temperature 98.8 F (37.1 C), temperature source Axillary, resp. rate 20, height 5' 4 (1.626 m), weight 95 kg, SpO2 97%.   Exam: Confused-hard to arouse-not in any distress Atraumatic/normocephalic Chest: Clear to auscultation CVS: S1-S2 regular Abdomen: Soft nontender Extremities: No edema Difficult exam but seems to be moving all 4 extremities   Pertinent Labs/Radiology:    Latest Ref Rng & Units 06/14/2024    4:26 AM 06/13/2024    5:55 AM 06/12/2024    5:04 AM  CBC  WBC 4.0 - 10.5 K/uL 8.6  9.0  11.2   Hemoglobin 12.0 - 15.0 g/dL 8.1  7.3  7.9   Hematocrit 36.0 - 46.0 % 25.6  23.3  26.0   Platelets 150 - 400 K/uL 37  37  50     Lab Results  Component Value Date   NA 150 (H) 06/14/2024   K 3.6 06/14/2024   CL 117 (H) 06/14/2024   CO2 24 06/14/2024      Assessment/Plan: Severe sepsis secondary to aspiration  pneumonia/UTI Sepsis physiology has resolved Culture data as above Continue IV Unasyn .  Acute hypoxic respiratory failure secondary to aspiration pneumonia Remains stable on 2-3 L of oxygen-remains on Unasyn  If able to follow commands/more awake/alert-encourage use of incentive spirometry/flutter valve Aspiration precautions SLP following.  Per SLP-doing further MBS will not change management-recommendations are to consider comfort feeds.  Goals of care discussion in progress.  Oropharyngeal dysphagia Secondary to combination of dementia/prior CVA Per SLP eval-further MBS would likely not change outcome Palliative care following-goals of care discussion in progress. With patient's son Garen on 8/7-comfort feeding was discussed- he is currently discussing with other family members-await family decision.  Hypernatremia Secondary dehydration in the setting of infection Continues to have mild hyponatremia Switch to half-normal saline.  Hypokalemia Repleted  AKI on CKD stage IIIa Hemodynamically mediated-secondary to dehydration Improved with IVF  Normocytic anemia Likely secondary to critical illness No obvious evidence of GI bleed/blood loss Follow CBC  Thrombocytopenia Unclear etiology-?  Sepsis physiology Worsening-thankfully no obvious bleeding Very frail-significant encephalopathy with severe failure to thrive  syndrome-do not think further workup will change management outcome. Goals of care in progress-see below.  Acute metabolic encephalopathy superimposed on dementia Encephalopathy secondary to infection/in the future-and electrolyte abnormalities. Delirium precautions Continue to treat underlying etiology.  PAF Rate controlled Per prior notes-no longer on anticoagulation due to history of GI bleed.  Chronic HFpEF Currently euvolemic on exam  History of pericardial effusion None  seen on echo on 8/5.  DM 2 SSI  Recent Labs    06/13/24 1200  06/13/24 1737 06/13/24 2257  GLUCAP 154* 164* 146*     COPD Bronchodilators  History of CVA Resume oral medications when able.  Currently n.p.o.-see above regarding oropharyngeal dysphagia.  Palliative care Failure to thrive  syndrome DNR in place Frail 73 year old-SNF resident-prior CVA-history of dysphagia here with UTI/aspiration pneumonia.  Per SLP-further workup will not change management-recommendations are to proceed with comfort feeds.  Discussed with son Garen at bedside on 8/7-he understands poor prognosis-and that placing feeding tube is not going to change the outcome-furthermore with her advanced dementia-she will likely attempt to pull it out-she will require significant amount of restraint/sedation that is going to worsen her risk of aspiration.  We talked about transitioning to hospice care versus continuing with current management (will likely get more aspiration episode/rehospitalization in the future).  Heis still talking to other family members.Palliative care following.  Will await family decision.    Class 2 Obesity: Estimated body mass index is 35.95 kg/m as calculated from the following:   Height as of this encounter: 5' 4 (1.626 m).   Weight as of this encounter: 95 kg.   Code status:   Code Status: Limited: Do not attempt resuscitation (DNR) -DNR-LIMITED -Do Not Intubate/DNI    DVT Prophylaxis: SCDs Start: 06/11/24 2233 Place TED hose Start: 06/11/24 2233  Family Communication:Son Claudia Keller [phone] updated over the phone 8/6   Disposition Plan: Status is: Inpatient Remains inpatient appropriate because: Severity of illness   Planned Discharge Destination: SNF versus hospice   Diet: Diet Order             Diet NPO time specified  Diet effective now                     Antimicrobial agents: Anti-infectives (From admission, onward)    Start     Dose/Rate Route Frequency Ordered Stop   06/12/24 1600  vancomycin  (VANCOREADY) IVPB 750  mg/150 mL  Status:  Discontinued        750 mg 150 mL/hr over 60 Minutes Intravenous Every 24 hours 06/11/24 2303 06/13/24 1002   06/12/24 0500  Ampicillin -Sulbactam (UNASYN ) 3 g in sodium chloride  0.9 % 100 mL IVPB  Status:  Discontinued        3 g 200 mL/hr over 30 Minutes Intravenous Every 6 hours 06/11/24 2303 06/11/24 2304   06/12/24 0300  Ampicillin -Sulbactam (UNASYN ) 3 g in sodium chloride  0.9 % 100 mL IVPB        3 g 200 mL/hr over 30 Minutes Intravenous Every 6 hours 06/11/24 2304     06/11/24 1445  vancomycin  (VANCOCIN ) IVPB 1000 mg/200 mL premix  Status:  Discontinued        1,000 mg 200 mL/hr over 60 Minutes Intravenous  Once 06/11/24 1440 06/11/24 1443   06/11/24 1445  ceFEPIme  (MAXIPIME ) 2 g in sodium chloride  0.9 % 100 mL IVPB        2 g 200 mL/hr over 30 Minutes Intravenous  Once 06/11/24 1440 06/11/24 1538   06/11/24 1445  vancomycin  (VANCOREADY) IVPB 2000 mg/400 mL        2,000 mg 200 mL/hr over 120 Minutes Intravenous  Once 06/11/24 1443 06/11/24 1800        MEDICATIONS: Scheduled Meds:  Chlorhexidine  Gluconate Cloth  6 each Topical Daily   Gerhardt's butt cream   Topical BID   guaiFENesin   1,200 mg Oral BID  ipratropium-albuterol   3 mL Nebulization TID   leptospermum manuka honey  1 Application Topical Daily   sodium chloride  flush  3 mL Intravenous Q12H   Continuous Infusions:  sodium chloride  50 mL/hr at 06/13/24 1128   ampicillin -sulbactam (UNASYN ) IV 3 g (06/14/24 0927)   PRN Meds:.dextrose , guaiFENesin , hydrALAZINE , insulin  aspart, ipratropium-albuterol , ondansetron  (ZOFRAN ) IV, senna-docusate, sodium chloride  flush, trimethobenzamide    I have personally reviewed following labs and imaging studies  LABORATORY DATA: CBC: Recent Labs  Lab 06/11/24 1450 06/11/24 2325 06/12/24 0504 06/13/24 0555 06/14/24 0426  WBC 12.7*  --  11.2* 9.0 8.6  NEUTROABS 9.9*  --   --   --   --   HGB 10.7* 8.5* 7.9* 7.3* 8.1*  HCT 35.6* 25.0* 26.0* 23.3* 25.6*   MCV 104.7*  --  106.1* 102.2* 100.4*  PLT 82*  --  50* 37* 37*    Basic Metabolic Panel: Recent Labs  Lab 06/11/24 2215 06/11/24 2325 06/12/24 0504 06/12/24 1240 06/12/24 2121 06/13/24 0555 06/14/24 0426  NA 160*   < > 162* 159* 151* 146* 150*  K 3.8   < > 3.4* 2.8* 3.5 4.0 3.6  CL 121*   < > 126* 120* 116* 112* 117*  CO2 28   < > 26 30 27 25 24   GLUCOSE 137*   < > 158* 192* 220* 250* 163*  BUN 33*   < > 29* 24* 20 17 15   CREATININE 1.36*   < > 1.33* 1.21* 1.08* 1.16* 1.12*  CALCIUM  9.9   < > 10.3 10.0 9.7 8.9 8.7*  MG 1.2*  --   --   --   --  1.9  --   PHOS  --   --   --  1.3*  --  4.5  --    < > = values in this interval not displayed.    GFR: Estimated Creatinine Clearance: 51.5 mL/min (A) (by C-G formula based on SCr of 1.12 mg/dL (H)).  Liver Function Tests: Recent Labs  Lab 06/11/24 1450  AST 26  ALT 15  ALKPHOS 73  BILITOT 0.8  PROT 5.0*  ALBUMIN  2.0*   No results for input(s): LIPASE, AMYLASE in the last 168 hours. Recent Labs  Lab 06/12/24 0946  AMMONIA 25    Coagulation Profile: No results for input(s): INR, PROTIME in the last 168 hours.  Cardiac Enzymes: No results for input(s): CKTOTAL, CKMB, CKMBINDEX, TROPONINI in the last 168 hours.  BNP (last 3 results) No results for input(s): PROBNP in the last 8760 hours.  Lipid Profile: No results for input(s): CHOL, HDL, LDLCALC, TRIG, CHOLHDL, LDLDIRECT in the last 72 hours.  Thyroid  Function Tests: Recent Labs    06/12/24 0946 06/12/24 1240  TSH 0.037*  --   FREET4  --  0.94    Anemia Panel: Recent Labs    06/12/24 0946 06/13/24 0555  VITAMINB12 1,559*  --   FOLATE  --  9.8    Urine analysis:    Component Value Date/Time   COLORURINE AMBER (A) 06/11/2024 1506   APPEARANCEUR CLOUDY (A) 06/11/2024 1506   LABSPEC 1.017 06/11/2024 1506   PHURINE 6.0 06/11/2024 1506   GLUCOSEU NEGATIVE 06/11/2024 1506   HGBUR NEGATIVE 06/11/2024 1506   BILIRUBINUR  NEGATIVE 06/11/2024 1506   KETONESUR NEGATIVE 06/11/2024 1506   PROTEINUR 100 (A) 06/11/2024 1506   UROBILINOGEN 1.0 01/10/2015 1754   NITRITE NEGATIVE 06/11/2024 1506   LEUKOCYTESUR TRACE (A) 06/11/2024 1506    Sepsis Labs: Lactic Acid,  Venous    Component Value Date/Time   LATICACIDVEN 1.7 06/12/2024 0130    MICROBIOLOGY: Recent Results (from the past 240 hours)  Culture, blood (routine x 2)     Status: None (Preliminary result)   Collection Time: 06/11/24  2:50 PM   Specimen: BLOOD  Result Value Ref Range Status   Specimen Description BLOOD SITE NOT SPECIFIED  Final   Special Requests   Final    BOTTLES DRAWN AEROBIC AND ANAEROBIC Blood Culture results may not be optimal due to an inadequate volume of blood received in culture bottles   Culture   Final    NO GROWTH 2 DAYS Performed at [facility], [address], [place], [place] [address]    Report Status PENDING  Incomplete  Culture, blood (routine x 2)     Status: None (Preliminary result)   Collection Time: 06/11/24  2:50 PM   Specimen: BLOOD  Result Value Ref Range Status   Specimen Description BLOOD SITE NOT SPECIFIED  Final   Special Requests   Final    BOTTLES DRAWN AEROBIC AND ANAEROBIC Blood Culture results may not be optimal due to an inadequate volume of blood received in culture bottles   Culture   Final    NO GROWTH 2 DAYS Performed at [facility], [address] [place], [place] [address]    Report Status PENDING  Incomplete  Resp panel by RT-PCR (RSV, Flu A&B, Covid) Anterior Nasal Swab     Status: None   Collection Time: 06/11/24  3:06 PM   Specimen: Anterior Nasal Swab  Result Value Ref Range Status   SARS Coronavirus 2 by RT PCR NEGATIVE NEGATIVE Final   Influenza A by PCR NEGATIVE NEGATIVE Final   Influenza B by PCR NEGATIVE NEGATIVE Final    Comment: (NOTE) The Xpert Xpress SARS-CoV-2/FLU/RSV plus assay is intended as an aid in the diagnosis of influenza from Nasopharyngeal swab  specimens and should not be used as a sole basis for treatment. Nasal washings and aspirates are unacceptable for Xpert Xpress SARS-CoV-2/FLU/RSV testing.  Fact Sheet for Patients: BloggerCourse.com  Fact Sheet for Healthcare Providers: SeriousBroker.it  This test is not yet approved or cleared by the [place]  FDA and has been authorized for detection and/or diagnosis of SARS-CoV-2 by FDA under an Emergency Use Authorization (EUA). This EUA will remain in effect (meaning this test can be used) for the duration of the COVID-19 declaration under Section 564(b)(1) of the Act, 21 U.S.C. section 360bbb-3(b)(1), unless the authorization is terminated or revoked.     Resp Syncytial Virus by PCR NEGATIVE NEGATIVE Final    Comment: (NOTE) Fact Sheet for Patients: BloggerCourse.com  Fact Sheet for Healthcare Providers: SeriousBroker.it  This test is not yet approved or cleared by the [place]  FDA and has been authorized for detection and/or diagnosis of SARS-CoV-2 by FDA under an Emergency Use Authorization (EUA). This EUA will remain in effect (meaning this test can be used) for the duration of the COVID-19 declaration under Section 564(b)(1) of the Act, 21 U.S.C. section 360bbb-3(b)(1), unless the authorization is terminated or revoked.  Performed at [facility], [address], [place], [place] [address]   Urine Culture     Status: Abnormal (Preliminary result)   Collection Time: 06/11/24  3:06 PM   Specimen: Urine, Random  Result Value Ref Range Status   Specimen Description URINE, RANDOM  Final   Special Requests NONE Reflexed from [id number]  Final  Culture (A)  Final    >=100,000 COLONIES/mL ESCHERICHIA COLI 50,000 COLONIES/mL PROVIDENCIA STUARTII 10,000 COLONIES/mL ENTEROCOCCUS FAECALIS SUSCEPTIBILITIES TO FOLLOW    Report Status PENDING  Incomplete    Organism ID, Bacteria ESCHERICHIA COLI (A)  Final   Organism ID, Bacteria PROVIDENCIA STUARTII (A)  Final      Susceptibility   Escherichia coli - MIC*    AMPICILLIN  16 INTERMEDIATE Intermediate     CEFAZOLIN  <=4 SENSITIVE Sensitive     CEFEPIME  <=0.12 SENSITIVE Sensitive     CEFTRIAXONE  <=0.25 SENSITIVE Sensitive     CIPROFLOXACIN  >=4 RESISTANT Resistant     GENTAMICIN <=1 SENSITIVE Sensitive     IMIPENEM <=0.25 SENSITIVE Sensitive     NITROFURANTOIN <=16 SENSITIVE Sensitive     TRIMETH /SULFA  80 RESISTANT Resistant     AMPICILLIN /SULBACTAM 4 SENSITIVE Sensitive     PIP/TAZO Value in next row Sensitive ug/mL     <=4 SENSITIVEPerformed at [facility], [address], [place], [place] [address]    * >=100,000 COLONIES/mL ESCHERICHIA COLI   Providencia stuartii - MIC*    AMPICILLIN  Value in next row Resistant      <=4 SENSITIVEPerformed at [facility], [address] [place], [place] [address]    CEFEPIME  Value in next row Sensitive      <=4 SENSITIVEPerformed at [facility], [address], [place], [place] [address]    CEFTRIAXONE  Value in next row Sensitive      <=4 SENSITIVEPerformed at [facility], [address] [place], [place] [address]    CIPROFLOXACIN  Value in next row Resistant      <=4 SENSITIVEPerformed at [facility], [address], [place], [place] [address]    GENTAMICIN Value in next row Resistant      <=4 SENSITIVEPerformed at [facility], [address] [place], [place] [address]    IMIPENEM Value in next row Sensitive      <=4 SENSITIVEPerformed at [facility], [address] [place], [place] [address]    NITROFURANTOIN Value in next row Resistant      <=4 SENSITIVEPerformed at [facility], [address] [place], [place] [address]    TRIMETH /SULFA  Value in next row Sensitive      <=4 SENSITIVEPerformed at [facility], [address], [place], [place] [address]    AMPICILLIN /SULBACTAM Value in next  row Sensitive      <=4 SENSITIVEPerformed at [facility], [address] [place], [place] [address]    PIP/TAZO Value in next row Sensitive ug/mL     <=4 SENSITIVEPerformed at [facility], [address], [place], [place] [address]    * 50,000 COLONIES/mL PROVIDENCIA STUARTII    RADIOLOGY STUDIES/RESULTS: No results found.    LOS: 3 days   Donalda Applebaum, MD  [facility]    To contact the attending provider between 7A-7P or the covering provider during after hours 7P-7A, please log into the web site [email or web address] and access using universal [place] password for that web site. If you do not have the password, please call the hospital operator.  06/14/2024, 9:52 AM

## 2024-06-14 NOTE — TOC Progression Note (Signed)
 Transition of Care [facility]) - Progression Note    Patient Details  Name: Claudia Keller MRN: [id number] Date of Birth: Apr 17, 1951  Transition of Care [facility] CM/SW Contact  Inocente GORMAN Kindle, LCSW Phone Number: 06/14/2024, 2:46 PM  Clinical Narrative:    CSW received request from Palliative for hospice facility placement with son's preference as [facility]). CSW spoke with Dorina at [facility] and she stated a nurse could come to the hospital tomorrow around 11-11:30am to evaluate patient.    Expected Discharge Plan: Skilled Nursing Facility Barriers to Discharge: Continued Medical Work up               Expected Discharge Plan and Services In-house Referral: Clinical Social Work   Post Acute Care Choice: Hospice Living arrangements for the past 2 months: Hospice Facility                                       Social Drivers of Health (SDOH) Interventions SDOH Screenings   Food Insecurity: No Food Insecurity (06/14/2024)  Housing: Low Risk  (06/14/2024)  Transportation Needs: No Transportation Needs (06/14/2024)  Utilities: Not At Risk (06/14/2024)  Depression (PHQ2-9): Low Risk  (12/31/2021)  Social Connections: Unknown (06/14/2024)  Tobacco Use: High Risk (06/11/2024)    Readmission Risk Interventions     No data to display

## 2024-06-15 DIAGNOSIS — J9601 Acute respiratory failure with hypoxia: Secondary | ICD-10-CM | POA: Diagnosis not present

## 2024-06-15 DIAGNOSIS — R627 Adult failure to thrive: Secondary | ICD-10-CM

## 2024-06-15 DIAGNOSIS — A419 Sepsis, unspecified organism: Secondary | ICD-10-CM | POA: Diagnosis not present

## 2024-06-15 DIAGNOSIS — F0390 Unspecified dementia without behavioral disturbance: Secondary | ICD-10-CM

## 2024-06-15 DIAGNOSIS — Z7189 Other specified counseling: Secondary | ICD-10-CM

## 2024-06-15 DIAGNOSIS — E870 Hyperosmolality and hypernatremia: Secondary | ICD-10-CM | POA: Diagnosis not present

## 2024-06-15 DIAGNOSIS — N179 Acute kidney failure, unspecified: Secondary | ICD-10-CM | POA: Diagnosis not present

## 2024-06-15 DIAGNOSIS — Z515 Encounter for palliative care: Secondary | ICD-10-CM

## 2024-06-15 DIAGNOSIS — Z9189 Other specified personal risk factors, not elsewhere classified: Secondary | ICD-10-CM

## 2024-06-15 DIAGNOSIS — F03C0 Unspecified dementia, severe, without behavioral disturbance, psychotic disturbance, mood disturbance, and anxiety: Secondary | ICD-10-CM

## 2024-06-15 LAB — URINE CULTURE: Culture: >=100000 — AB

## 2024-06-15 MED ORDER — MORPHINE SULFATE (PF) 2 MG/ML IV SOLN: 1.0000 mg | INTRAVENOUS | Status: AC | PRN

## 2024-06-15 MED ORDER — MIDAZOLAM HCL 2 MG/2ML IJ SOLN
2.0000 mg | INTRAMUSCULAR | Status: DC | PRN
  Administered 2024-06-15: 2 mg via INTRAVENOUS
  Filled 2024-06-15: qty 2

## 2024-06-15 MED ORDER — MIDAZOLAM HCL 2 MG/2ML IJ SOLN: 1.0000 mg | INTRAMUSCULAR | Status: AC | PRN

## 2024-06-15 MED ORDER — GLYCOPYRROLATE 1 MG PO TABS: 1.0000 mg | ORAL_TABLET | ORAL | Status: AC | PRN

## 2024-06-15 NOTE — Progress Notes (Signed)
 SLP Cancellation Note  Patient Details Name: DUSTY WAGONER MRN: [id number] DOB: 1951-08-14   Cancelled treatment:       Reason Eval/Treat Not Completed: Per chart review, pt and her family have decided to receive comfort care. SLP will sign off acutely but please re-consult if acute needs arise.    Damien Blumenthal, M.A., CCC-SLP Speech Language Pathology, Acute Rehabilitation Services  Secure Chat preferred [phone]  06/15/2024, 9:02 AM

## 2024-06-15 NOTE — Plan of Care (Signed)
  Problem: Education: Goal: Ability to describe self-care measures that may prevent or decrease complications (Diabetes Survival Skills Education) will improve Outcome: Progressing Goal: Individualized Educational Video(s) Outcome: Progressing   Problem: Coping: Goal: Ability to adjust to condition or change in health will improve Outcome: Progressing   Problem: Fluid Volume: Goal: Ability to maintain a balanced intake and output will improve Outcome: Progressing   Problem: Health Behavior/Discharge Planning: Goal: Ability to identify and utilize available resources and services will improve Outcome: Progressing Goal: Ability to manage health-related needs will improve Outcome: Progressing   Problem: Metabolic: Goal: Ability to maintain appropriate glucose levels will improve Outcome: Progressing   Problem: Nutritional: Goal: Maintenance of adequate nutrition will improve Outcome: Progressing Goal: Progress toward achieving an optimal weight will improve Outcome: Progressing   Problem: Skin Integrity: Goal: Risk for impaired skin integrity will decrease Outcome: Progressing   Problem: Tissue Perfusion: Goal: Adequacy of tissue perfusion will improve Outcome: Progressing   Problem: Safety: Goal: Non-violent Restraint(s) Outcome: Progressing   Problem: Education: Goal: Knowledge of General Education information will improve Description: Including pain rating scale, medication(s)/side effects and non-pharmacologic comfort measures Outcome: Progressing   Problem: Health Behavior/Discharge Planning: Goal: Ability to manage health-related needs will improve Outcome: Progressing   Problem: Clinical Measurements: Goal: Ability to maintain clinical measurements within normal limits will improve Outcome: Progressing Goal: Will remain free from infection Outcome: Progressing Goal: Diagnostic test results will improve Outcome: Progressing Goal: Respiratory complications  will improve Outcome: Progressing Goal: Cardiovascular complication will be avoided Outcome: Progressing   Problem: Activity: Goal: Risk for activity intolerance will decrease Outcome: Progressing   Problem: Nutrition: Goal: Adequate nutrition will be maintained Outcome: Progressing   Problem: Coping: Goal: Level of anxiety will decrease Outcome: Progressing   Problem: Elimination: Goal: Will not experience complications related to bowel motility Outcome: Progressing Goal: Will not experience complications related to urinary retention Outcome: Progressing   Problem: Pain Managment: Goal: General experience of comfort will improve and/or be controlled Outcome: Progressing   Problem: Safety: Goal: Ability to remain free from injury will improve Outcome: Progressing   Problem: Skin Integrity: Goal: Risk for impaired skin integrity will decrease Outcome: Progressing   Problem: Activity: Goal: Ability to tolerate increased activity will improve Outcome: Progressing   Problem: Clinical Measurements: Goal: Ability to maintain a body temperature in the normal range will improve Outcome: Progressing   Problem: Respiratory: Goal: Ability to maintain adequate ventilation will improve Outcome: Progressing Goal: Ability to maintain a clear airway will improve Outcome: Progressing   Problem: Education: Goal: Knowledge of the prescribed therapeutic regimen will improve Outcome: Progressing   Problem: Coping: Goal: Ability to identify and develop effective coping behavior will improve Outcome: Progressing   Problem: Clinical Measurements: Goal: Quality of life will improve Outcome: Progressing   Problem: Respiratory: Goal: Verbalizations of increased ease of respirations will increase Outcome: Progressing   Problem: Role Relationship: Goal: Family's ability to cope with current situation will improve Outcome: Progressing Goal: Ability to verbalize concerns,  feelings, and thoughts to partner or family member will improve Outcome: Progressing   Problem: Pain Management: Goal: Satisfaction with pain management regimen will improve Outcome: Progressing

## 2024-06-15 NOTE — TOC Transition Note (Signed)
 Transition of Care [facility]) - Discharge Note   Patient Details  Name: Claudia Keller MRN: [id number] Date of Birth: Mar 17, 1951  Transition of Care [facility] CM/SW Contact:  Inocente GORMAN Kindle, LCSW Phone Number: 06/15/2024, 2:34 PM   Clinical Narrative:    Patient will DC to: [facility] Anticipated DC date: 06/16/23 Family notified: Son Transport by: [facility]   Per MD patient ready for DC to [facility]. RN to call report prior to discharge [phone]). RN, patient, patient's family, and facility notified of DC. Discharge Summary sent to facility. DC packet on chart including signed DNR. Ambulance transport requested for patient.   CSW will sign off for now as social work intervention is no longer needed. Please consult us  again if new needs arise.     Final next level of care: Hospice Medical Facility Barriers to Discharge: Barriers Resolved   Patient Goals and CMS Choice Patient states their goals for this hospitalization and ongoing recovery are:: Comfort CMS Medicare.gov Compare Post Acute Care list provided to:: Patient Represenative (must comment) Choice offered to / list presented to : Adult Children      Discharge Placement              Patient chooses bed at:  [facility]) Patient to be transferred to facility by: [facility] Name of family member notified: Son    Discharge Plan and Services Additional resources added to the After Visit Summary for   In-house Referral: Clinical Social Work   Post Acute Care Choice: Hospice                               Social Drivers of Health (SDOH) Interventions SDOH Screenings   Food Insecurity: No Food Insecurity (06/14/2024)  Housing: Low Risk  (06/14/2024)  Transportation Needs: No Transportation Needs (06/14/2024)  Utilities: Not At Risk (06/14/2024)  Depression (PHQ2-9): Low Risk  (12/31/2021)  Social Connections: Unknown (06/14/2024)  Tobacco Use: High Risk (06/11/2024)     Readmission Risk Interventions     No  data to display

## 2024-06-15 NOTE — Progress Notes (Signed)
 PROGRESS NOTE        PATIENT DETAILS Name: Claudia Keller Age: 73 y.o. Sex: female Date of Birth: June 02, 1951 Admit Date: 06/11/2024 Admitting Physician Micaela Speaker, MD ERE:Izfjmryp, Elsie HERO, MD  Brief Summary: Patient is a 73 y.o.  female with history of dementia, CVA, CKD stage IIIa, DM-2, HFpEF, anxiety/depression-who presented from SNF with lethargy/hypoxemia-she was found to have acute hypoxic respiratory failure secondary to aspiration pneumonia and was found to have severe hypernatremia.  She was admitted to the hospital-started on antibiotics/IV fluids with some improvement.  Given overall poor prognosis-severe failure to thrive  syndrome-hardly any oral intake-high risk for further decompensation-after extensive discussion with family-she was transitioned to full comfort measures on 8/7.   Significant events: 8/4>> admit to [facility] 8/7>> transitioned to comfort measures.   Significant studies: 8/4>> CT head: No acute intracranial abnormality 8/4>> CT chest/abdomen/pelvis: B/L consolidation both lower lobes, right upper lobe. 8/5>> echo: EF 60-65%   Significant microbiology data: 8/4>> COVID/influenza/RSV PCR: Negative 8/4>> urine culture: E. coli/Enterococcus/procidentia. 8/4>> blood culture: Negative   Procedures: None   Consults: Palliative  Subjective: Lethargic-appears comfortable-barely opens eyes.  No oral intake for the past several days.  Objective: Vitals: Blood pressure (!) 144/65, pulse (!) 101, temperature 98 F (36.7 C), temperature source Oral, resp. rate (!) 24, height 5' 4 (1.626 m), weight 95 kg, SpO2 93%.   Exam: Comfortable but lethargic-hard to arouse  Pertinent Labs/Radiology:    Latest Ref Rng & Units 06/14/2024    4:26 AM 06/13/2024    5:55 AM 06/12/2024    5:04 AM  CBC  WBC 4.0 - 10.5 K/uL 8.6  9.0  11.2   Hemoglobin 12.0 - 15.0 g/dL 8.1  7.3  7.9   Hematocrit 36.0 - 46.0 % 25.6  23.3  26.0   Platelets 150 - 400  K/uL 37  37  50     Lab Results  Component Value Date   NA 150 (H) 06/14/2024   K 3.6 06/14/2024   CL 117 (H) 06/14/2024   CO2 24 06/14/2024      Assessment/Plan: Severe sepsis secondary to aspiration pneumonia/UTI Sepsis physiology has resolved with IV Unasyn  Transitioned to full comfort measures on 8/7-no longer on antibiotics.   Acute hypoxic respiratory failure secondary to aspiration pneumonia Requiring around 2-3 L-no longer on antibiotics Per SLP -doing further MBS will not change management-recommendations are to consider comfort feeds.    Oropharyngeal dysphagia Secondary to combination of dementia/prior CVA Per SLP eval-further MBS would likely not change outcome After extensive discussion with family-transitioned to comfort measures on 8/7. Started on comfort feeds-unfortunately continues to have very poor oral intake.  Remains mostly lethargic/sleepy per RN.   Hypernatremia Secondary dehydration in the setting of infection Improved with D5 water  No further labs planned as patient comfort measures.   Hypokalemia Repleted   AKI on CKD stage IIIa Hemodynamically mediated-secondary to dehydration Improved with IVF   Normocytic anemia Likely secondary to critical illness No obvious evidence of GI bleed/blood loss   Thrombocytopenia Unclear etiology-?  Sepsis physiology Worsening-thankfully no obvious bleeding Very frail-significant encephalopathy with severe failure to thrive  syndrome-do not think further workup will change management outcome.  After discussion with family-now full comfort measures.   Acute metabolic encephalopathy superimposed on dementia Encephalopathy secondary to infection/electrolyte abnormalities. Maintain delirium precautions.   PAF Rate controlled Per prior notes-no  longer on anticoagulation due to history of GI bleed.   Chronic HFpEF Currently euvolemic on exam   History of pericardial effusion None seen on echo on 8/5.    DM 2 No longer SSI-comfort measures.   COPD Bronchodilators as needed.   History of CVA   Palliative care Failure to thrive  syndrome Very frail 73 year old resident of SNF-with history of CVA/dysphagia/prior aspiration pneumonia/failure to thrive  syndrome-here with aspiration pneumonia/UTI and profound hypernatremia.  Admitted and started on IV fluids/IV antibiotics with some improvement.  However her oral intake is incredibly poor-and she continues to be very lethargic.  Minimal oral intake since being hospitalized.  This MD has had extensive discussion with the patient's son-understands poor overall prognosis-understands that although we can temporarily fix her issues-with her severe failure to thrive  syndrome/poor oral intake-she is likely to have another episode of hyponatremia and infection.  She is a poor candidate for tube feeds-as she will likely require sedation for her to keep her in a long-term-putting at risk for more aspiration issues.  Palliative care followed as well-after further discussions with family-she was transition to full comfort measures on 8/7.  Plans are for residential hospice on discharge.   Class 2 Obesity: Estimated body mass index is 35.95 kg/m as calculated from the following:   Height as of this encounter: 5' 4 (1.626 m).   Weight as of this encounter: 95 kg.   Code status:   Code Status: Do not attempt resuscitation (DNR) - Comfort care   DVT Prophylaxis: SCDs Start: 06/11/24 2233 Place TED hose Start: 06/11/24 2233  Family Communication:Son Johnny [phone] updated over the phone 8/6   Disposition Plan: Status is: Inpatient Remains inpatient appropriate because: Severity of illness   Planned Discharge Destination: SNF versus hospice   Diet: Diet Order             Diet NPO time specified  Diet effective now                     Antimicrobial agents: Anti-infectives (From admission, onward)    Start     Dose/Rate Route  Frequency Ordered Stop   06/12/24 1600  vancomycin  (VANCOREADY) IVPB 750 mg/150 mL  Status:  Discontinued        750 mg 150 mL/hr over 60 Minutes Intravenous Every 24 hours 06/11/24 2303 06/13/24 1002   06/12/24 0500  Ampicillin -Sulbactam (UNASYN ) 3 g in sodium chloride  0.9 % 100 mL IVPB  Status:  Discontinued        3 g 200 mL/hr over 30 Minutes Intravenous Every 6 hours 06/11/24 2303 06/11/24 2304   06/12/24 0300  Ampicillin -Sulbactam (UNASYN ) 3 g in sodium chloride  0.9 % 100 mL IVPB  Status:  Discontinued        3 g 200 mL/hr over 30 Minutes Intravenous Every 6 hours 06/11/24 2304 06/14/24 2210   06/11/24 1445  vancomycin  (VANCOCIN ) IVPB 1000 mg/200 mL premix  Status:  Discontinued        1,000 mg 200 mL/hr over 60 Minutes Intravenous  Once 06/11/24 1440 06/11/24 1443   06/11/24 1445  ceFEPIme  (MAXIPIME ) 2 g in sodium chloride  0.9 % 100 mL IVPB        2 g 200 mL/hr over 30 Minutes Intravenous  Once 06/11/24 1440 06/11/24 1538   06/11/24 1445  vancomycin  (VANCOREADY) IVPB 2000 mg/400 mL        2,000 mg 200 mL/hr over 120 Minutes Intravenous  Once 06/11/24 1443 06/11/24 1800  MEDICATIONS: Scheduled Meds:  Chlorhexidine  Gluconate Cloth  6 each Topical Daily   Gerhardt's butt cream   Topical BID   guaiFENesin   1,200 mg Oral BID   leptospermum manuka honey  1 Application Topical Daily   sodium chloride  flush  3 mL Intravenous Q12H   Continuous Infusions:   PRN Meds:.acetaminophen  **OR** acetaminophen , antiseptic oral rinse, artificial tears, glycopyrrolate  **OR** glycopyrrolate  **OR** glycopyrrolate , guaiFENesin , hydrALAZINE , ipratropium-albuterol , midazolam , morphine  injection, ondansetron  (ZOFRAN ) IV, senna-docusate, sodium chloride  flush, trimethobenzamide    I have personally reviewed following labs and imaging studies  LABORATORY DATA: CBC: Recent Labs  Lab 06/11/24 1450 06/11/24 2325 06/12/24 0504 06/13/24 0555 06/14/24 0426  WBC 12.7*  --  11.2* 9.0 8.6   NEUTROABS 9.9*  --   --   --   --   HGB 10.7* 8.5* 7.9* 7.3* 8.1*  HCT 35.6* 25.0* 26.0* 23.3* 25.6*  MCV 104.7*  --  106.1* 102.2* 100.4*  PLT 82*  --  50* 37* 37*    Basic Metabolic Panel: Recent Labs  Lab 06/11/24 2215 06/11/24 2325 06/12/24 0504 06/12/24 1240 06/12/24 2121 06/13/24 0555 06/14/24 0426  NA 160*   < > 162* 159* 151* 146* 150*  K 3.8   < > 3.4* 2.8* 3.5 4.0 3.6  CL 121*   < > 126* 120* 116* 112* 117*  CO2 28   < > 26 30 27 25 24   GLUCOSE 137*   < > 158* 192* 220* 250* 163*  BUN 33*   < > 29* 24* 20 17 15   CREATININE 1.36*   < > 1.33* 1.21* 1.08* 1.16* 1.12*  CALCIUM  9.9   < > 10.3 10.0 9.7 8.9 8.7*  MG 1.2*  --   --   --   --  1.9  --   PHOS  --   --   --  1.3*  --  4.5  --    < > = values in this interval not displayed.    GFR: Estimated Creatinine Clearance: 51.5 mL/min (A) (by C-G formula based on SCr of 1.12 mg/dL (H)).  Liver Function Tests: Recent Labs  Lab 06/11/24 1450  AST 26  ALT 15  ALKPHOS 73  BILITOT 0.8  PROT 5.0*  ALBUMIN  2.0*   No results for input(s): LIPASE, AMYLASE in the last 168 hours. Recent Labs  Lab 06/12/24 0946  AMMONIA 25    Coagulation Profile: No results for input(s): INR, PROTIME in the last 168 hours.  Cardiac Enzymes: No results for input(s): CKTOTAL, CKMB, CKMBINDEX, TROPONINI in the last 168 hours.  BNP (last 3 results) No results for input(s): PROBNP in the last 8760 hours.  Lipid Profile: No results for input(s): CHOL, HDL, LDLCALC, TRIG, CHOLHDL, LDLDIRECT in the last 72 hours.  Thyroid  Function Tests: Recent Labs    06/12/24 1240  FREET4 0.94    Anemia Panel: Recent Labs    06/13/24 0555  FOLATE 9.8    Urine analysis:    Component Value Date/Time   COLORURINE AMBER (A) 06/11/2024 1506   APPEARANCEUR CLOUDY (A) 06/11/2024 1506   LABSPEC 1.017 06/11/2024 1506   PHURINE 6.0 06/11/2024 1506   GLUCOSEU NEGATIVE 06/11/2024 1506   HGBUR NEGATIVE  06/11/2024 1506   BILIRUBINUR NEGATIVE 06/11/2024 1506   KETONESUR NEGATIVE 06/11/2024 1506   PROTEINUR 100 (A) 06/11/2024 1506   UROBILINOGEN 1.0 01/10/2015 1754   NITRITE NEGATIVE 06/11/2024 1506   LEUKOCYTESUR TRACE (A) 06/11/2024 1506    Sepsis Labs: Lactic Acid, Venous  Component Value Date/Time   LATICACIDVEN 1.7 06/12/2024 0130    MICROBIOLOGY: Recent Results (from the past 240 hours)  Culture, blood (routine x 2)     Status: None (Preliminary result)   Collection Time: 06/11/24  2:50 PM   Specimen: BLOOD  Result Value Ref Range Status   Specimen Description BLOOD SITE NOT SPECIFIED  Final   Special Requests   Final    BOTTLES DRAWN AEROBIC AND ANAEROBIC Blood Culture results may not be optimal due to an inadequate volume of blood received in culture bottles   Culture   Final    NO GROWTH 4 DAYS Performed at [facility], [address] [place], [place] [address]    Report Status PENDING  Incomplete  Culture, blood (routine x 2)     Status: None (Preliminary result)   Collection Time: 06/11/24  2:50 PM   Specimen: BLOOD  Result Value Ref Range Status   Specimen Description BLOOD SITE NOT SPECIFIED  Final   Special Requests   Final    BOTTLES DRAWN AEROBIC AND ANAEROBIC Blood Culture results may not be optimal due to an inadequate volume of blood received in culture bottles   Culture   Final    NO GROWTH 4 DAYS Performed at [facility], [address] [place], [place] [address]    Report Status PENDING  Incomplete  Resp panel by RT-PCR (RSV, Flu A&B, Covid) Anterior Nasal Swab     Status: None   Collection Time: 06/11/24  3:06 PM   Specimen: Anterior Nasal Swab  Result Value Ref Range Status   SARS Coronavirus 2 by RT PCR NEGATIVE NEGATIVE Final   Influenza A by PCR NEGATIVE NEGATIVE Final   Influenza B by PCR NEGATIVE NEGATIVE Final    Comment: (NOTE) The Xpert Xpress SARS-CoV-2/FLU/RSV plus assay is intended as an aid in the diagnosis of  influenza from Nasopharyngeal swab specimens and should not be used as a sole basis for treatment. Nasal washings and aspirates are unacceptable for Xpert Xpress SARS-CoV-2/FLU/RSV testing.  Fact Sheet for Patients: BloggerCourse.com  Fact Sheet for Healthcare Providers: SeriousBroker.it  This test is not yet approved or cleared by the [place]  FDA and has been authorized for detection and/or diagnosis of SARS-CoV-2 by FDA under an Emergency Use Authorization (EUA). This EUA will remain in effect (meaning this test can be used) for the duration of the COVID-19 declaration under Section 564(b)(1) of the Act, 21 U.S.C. section 360bbb-3(b)(1), unless the authorization is terminated or revoked.     Resp Syncytial Virus by PCR NEGATIVE NEGATIVE Final    Comment: (NOTE) Fact Sheet for Patients: BloggerCourse.com  Fact Sheet for Healthcare Providers: SeriousBroker.it  This test is not yet approved or cleared by the [place]  FDA and has been authorized for detection and/or diagnosis of SARS-CoV-2 by FDA under an Emergency Use Authorization (EUA). This EUA will remain in effect (meaning this test can be used) for the duration of the COVID-19 declaration under Section 564(b)(1) of the Act, 21 U.S.C. section 360bbb-3(b)(1), unless the authorization is terminated or revoked.  Performed at [facility], [address] [place], [place] [address]   Urine Culture     Status: Abnormal   Collection Time: 06/11/24  3:06 PM   Specimen: Urine, Random  Result Value Ref Range Status   Specimen Description URINE, RANDOM  Final   Special Requests   Final    NONE Reflexed from [id number] Performed at [facility], [address] [place], [place] [address]    Culture (A)  Final    >=100,000 COLONIES/mL ESCHERICHIA COLI 50,000 COLONIES/mL PROVIDENCIA STUARTII 10,000 COLONIES/mL  ENTEROCOCCUS FAECALIS    Report Status 06/15/2024 FINAL  Final   Organism ID, Bacteria ESCHERICHIA COLI (A)  Final   Organism ID, Bacteria PROVIDENCIA STUARTII (A)  Final   Organism ID, Bacteria ENTEROCOCCUS FAECALIS (A)  Final      Susceptibility   Escherichia coli - MIC*    AMPICILLIN  16 INTERMEDIATE Intermediate     CEFAZOLIN  <=4 SENSITIVE Sensitive     CEFEPIME  <=0.12 SENSITIVE Sensitive     CEFTRIAXONE  <=0.25 SENSITIVE Sensitive     CIPROFLOXACIN  >=4 RESISTANT Resistant     GENTAMICIN <=1 SENSITIVE Sensitive     IMIPENEM <=0.25 SENSITIVE Sensitive     NITROFURANTOIN <=16 SENSITIVE Sensitive     TRIMETH /SULFA  80 RESISTANT Resistant     AMPICILLIN /SULBACTAM 4 SENSITIVE Sensitive     PIP/TAZO <=4 SENSITIVE Sensitive ug/mL    * >=100,000 COLONIES/mL ESCHERICHIA COLI   Enterococcus faecalis - MIC*    AMPICILLIN  <=2 SENSITIVE Sensitive     NITROFURANTOIN <=16 SENSITIVE Sensitive     VANCOMYCIN  1 SENSITIVE Sensitive     * 10,000 COLONIES/mL ENTEROCOCCUS FAECALIS   Providencia stuartii - MIC*    AMPICILLIN  RESISTANT Resistant     CEFEPIME  <=0.12 SENSITIVE Sensitive     CEFTRIAXONE  <=0.25 SENSITIVE Sensitive     CIPROFLOXACIN  >=4 RESISTANT Resistant     GENTAMICIN RESISTANT Resistant     IMIPENEM 1 SENSITIVE Sensitive     NITROFURANTOIN 256 RESISTANT Resistant     TRIMETH /SULFA  <=20 SENSITIVE Sensitive     AMPICILLIN /SULBACTAM 8 SENSITIVE Sensitive     PIP/TAZO <=4 SENSITIVE Sensitive ug/mL    * 50,000 COLONIES/mL PROVIDENCIA STUARTII    RADIOLOGY STUDIES/RESULTS: No results found.    LOS: 4 days   Donalda Applebaum, MD  [facility]    To contact the attending provider between 7A-7P or the covering provider during after hours 7P-7A, please log into the web site [email or web address] and access using universal [place] password for that web site. If you do not have the password, please call the hospital operator.  06/15/2024, 11:07 AM

## 2024-06-15 NOTE — Progress Notes (Signed)
 Palliative Medicine Progress Note   Patient Name: Claudia Keller       Date: 06/15/2024 DOB: Feb 07, 1951  Age: 73 y.o. MRN#: [id number] Attending Physician: Raenelle Donalda HERO, MD Primary Care Physician: Iver Elsie HERO, MD Admit Date: 06/11/2024    HPI/Patient Profile: 73 y.o. female  with past medical history of CVA with residual hemiplegia, advanced dementia, essential hypertension, hypothyroidism, hyperlipidemia, GERD, CVA, atrial fibrillation, CKD stage III, DM type II, pericardial effusion, congestive heart failure, gout, depression, anxiety, bipolar disorder, PTSD, COPD, morbid obesity, IBS, history of GI bleed in January 2025 not on anticoagulation anymore and prolonged QTc interval  admitted on 06/11/2024 with hypoxia and altered mental status at SNF/LTC.    [facility] reported abnormal labs and patient non-verbal when she can typically talk at baseline. She was being treated for pneumonia. In the ED, she had signs of sepsis with acute hypoxic respiratory failure and workup showed evidence of bilateral PNA with concern for cavitary lesion, as well as UTI, electrolyte disturbances, and AKI on CKD3a. She is NPO due to oropharyngeal dysphagia with poor prognosis and poor candidacy for feeding tube. MBS not anticipated to change outcome.   PMT has been consulted to assist with goals of care conversation.  Subjective: Chart reviewed. Per MAR, patient has not received any PRN medications since comfort care orders were placed yesterday.  Patient assessed. She is awake but restless, picking at her IV, clothes, etc. Speech is garbled, she is not having any meaningful communication. Bilateral mittens are in place, which I removed and then spoke with her RN to request she give a dose of versed    Son  and daughter are at bedside. They report that this morning patient had few sips of water  and a bite of applesauce, and started coughing almost immediately. Reviewed the concept of comfort feeds. Discussed natural trajectory at end-of-life.  I later spoke by with Dr. Signe Bach, medical director for [facility] by phone. We discussed patient's eligibility for the hospice facility in [facility]. I explained patient's advanced dementia, failure to thrive , and now severe dysphasia. He agrees that patient is on a terminal trajectory with prognosis likely less than 4 weeks, and that she will be accepted to the facility.   Objective:  Physical Exam Vitals reviewed.  Constitutional:      General: She  is awake. She is not in acute distress.    Appearance: She is ill-appearing.  Pulmonary:     Effort: No respiratory distress.  Neurological:     Motor: Weakness present.     Comments: Garbled speech  Psychiatric:        Cognition and Memory: Cognition is impaired. Memory is impaired.             Palliative Medicine Assessment & Plan   Assessment: Principal Problem:   Sepsis secondary to aspiration pneumonia and UTI (HCC) Active Problems:   Insulin  dependent type 2 diabetes mellitus (HCC)   PTSD (post-traumatic stress disorder)   Acute kidney injury superimposed on CKD (HCC)   Acute hypoxic respiratory failure (HCC)   GERD (gastroesophageal reflux disease)   Anxiety   History of CVA (cerebrovascular accident)   Dementia (HCC)   History of pericardial effusion   Acute metabolic encephalopathy   Paroxysmal atrial fibrillation (HCC)   Bipolar disorder (HCC)   Hypernatremia   Hypokalemia   Hyperlipidemia   Hypercalcemia   History of dementia   Chronic diastolic CHF (congestive heart failure) (HCC)   Depression   History of COPD   History of GI bleed   Thrombocytopenia (HCC)   Acute cystitis    Recommendations/Plan: Continue comfort care Comfort feeds Patient has been  accepted to inpatient hospice facility in [facility] PRN medications are available as per below for symptom management at end-of-life PMT will continue to support   Symptom Management:  Morphine  prn for pain or dyspnea Midazolam  (Versed ) prn for agitation Glycopyrrolate  (ROBINUL ) for excessive secretions Ondansetron  (ZOFRAN ) prn for nausea Polyvinyl alcohol  (LIQUIFILM TEARS) prn for dry eyes Antiseptic oral rinse (BIOTENE) prn for dry mouth  Code Status: DNR - comfort   Prognosis:  < 4 weeks  Discharge Planning: Hospice facility   Thank you for allowing the Palliative Medicine Team to assist in the care of this patient.   Time: 52 minutes   Recardo KATHEE Loll, NP   Please contact Palliative Medicine Team phone at [phone] for questions and concerns.  For individual providers, please see AMION.

## 2024-06-15 NOTE — Discharge Summary (Addendum)
 PATIENT DETAILS Name: Claudia Keller Age: 73 y.o. Sex: female Date of Birth: 1951/09/09 MRN: [id number]. Admitting Physician: Subrina Sundil, MD ERE:[email or web address], Elsie HERO, MD  Admit Date: 06/11/2024 Discharge date: 06/15/2024  Recommendations for Outpatient Follow-up:  Optimize comfort medications.  Admitted From:  SNF  Disposition: Hospice care   Discharge Condition: poor  CODE STATUS:   Code Status: Do not attempt resuscitation (DNR) - Comfort care   Diet recommendation:  Diet Order             Diet NPO time specified  Diet effective now                    Brief Summary: Patient is a 73 y.o.  female with history of dementia, CVA, CKD stage IIIa, DM-2, HFpEF, anxiety/depression-who presented from SNF with lethargy/hypoxemia-she was found to have acute hypoxic respiratory failure secondary to aspiration pneumonia and was found to have severe hypernatremia.  She was admitted to the hospital-started on antibiotics/IV fluids with some improvement.  Given overall poor prognosis-severe failure to thrive  syndrome-hardly any oral intake-high risk for further decompensation-after extensive discussion with family-she was transitioned to full comfort measures on 8/7.   Significant events: 8/4>> admit to [facility] 8/7>> transitioned to comfort measures.   Significant studies: 8/4>> CT head: No acute intracranial abnormality 8/4>> CT chest/abdomen/pelvis: B/L consolidation both lower lobes, right upper lobe. 8/5>> echo: EF 60-65%   Significant microbiology data: 8/4>> COVID/influenza/RSV PCR: Negative 8/4>> urine culture: E. coli/Enterococcus/procidentia. 8/4>> blood culture: Negative   Procedures: None   Consults: None  Brief Hospital Course: Severe sepsis secondary to aspiration pneumonia/UTI Sepsis physiology has resolved with IV Unasyn  Transitioned to full comfort measures on 8/7-no longer on antibiotics.  Acute hypoxic respiratory failure secondary to aspiration  pneumonia Requiring around 2-3 L-no longer on antibiotics Per SLP -doing further MBS will not change management-recommendations are to consider comfort feeds.    Oropharyngeal dysphagia Secondary to combination of dementia/prior CVA Per SLP eval-further MBS would likely not change outcome After extensive discussion with family-transitioned to comfort measures on 8/7. Started on comfort feeds-unfortunately continues to have very poor oral intake.  Remains mostly lethargic/sleepy per RN.  Hypernatremia Secondary dehydration in the setting of infection Improved with D5 water  No further labs planned as patient comfort measures.  Hypokalemia Repleted   AKI on CKD stage IIIa Hemodynamically mediated-secondary to dehydration Improved with IVF   Normocytic anemia Likely secondary to critical illness No obvious evidence of GI bleed/blood loss   Thrombocytopenia Unclear etiology-?  Sepsis physiology Worsening-thankfully no obvious bleeding Very frail-significant encephalopathy with severe failure to thrive  syndrome-do not think further workup will change management outcome.  After discussion with family-now full comfort measures.   Acute metabolic encephalopathy superimposed on dementia Encephalopathy secondary to infection/electrolyte abnormalities. Maintain delirium precautions.   PAF Rate controlled Per prior notes-no longer on anticoagulation due to history of GI bleed.   Chronic HFpEF Currently euvolemic on exam   History of pericardial effusion None seen on echo on 8/5.   DM 2 No longer SSI-comfort measures.  COPD Bronchodilators as needed.   History of CVA  Palliative care Failure to thrive  syndrome Very frail 73 year old resident of SNF-with history of CVA/dysphagia/prior aspiration pneumonia/failure to thrive  syndrome-here with aspiration pneumonia/UTI and profound hypernatremia.  Admitted and started on IV fluids/IV antibiotics with some improvement.  However  her oral intake is incredibly poor-and she continues to be very lethargic.  Minimal oral intake since being hospitalized.  This MD has had  extensive discussion with the patient's son-understands poor overall prognosis-understands that although we can temporarily fix her issues-with her severe failure to thrive  syndrome/poor oral intake-she is likely to have another episode of hyponatremia and infection.  She is a poor candidate for tube feeds-as she will likely require sedation for her to keep her in a long-term-putting at risk for more aspiration issues.  Palliative care followed as well-after further discussions with family-she was transition to full comfort measures on 8/7.  Plans are for residential hospice on discharge.  Class 2 Obesity: Estimated body mass index is 35.95 kg/m as calculated from the following:   Height as of this encounter: 5' 4 (1.626 m).   Weight as of this encounter: 95 kg.   Discharge Diagnoses:  Principal Problem:   Sepsis secondary to aspiration pneumonia and UTI (HCC) Active Problems:   Insulin  dependent type 2 diabetes mellitus (HCC)   Acute metabolic encephalopathy   Hypernatremia   Paroxysmal atrial fibrillation (HCC)   PTSD (post-traumatic stress disorder)   Acute kidney injury superimposed on CKD (HCC)   Acute hypoxic respiratory failure (HCC)   GERD (gastroesophageal reflux disease)   Anxiety   History of CVA (cerebrovascular accident)   Dementia (HCC)   History of pericardial effusion   Bipolar disorder (HCC)   Hypokalemia   Hyperlipidemia   Hypercalcemia   History of dementia   Chronic diastolic CHF (congestive heart failure) (HCC)   Depression   History of COPD   History of GI bleed   Thrombocytopenia (HCC)   Acute cystitis   Discharge Instructions:  Activity:  As tolerated   Discharge Instructions     Discharge wound care:   Complete by: As directed    Wound care  Daily      Comments: Cleanse sacral wound with NS, apply Medihoney  to wound bed daily, cover with dry gauze and secure with silicone foam or ABD pad whichever is preferred   Increase activity slowly   Complete by: As directed       Allergies as of 06/15/2024   No Known Allergies      Medication List     STOP taking these medications    acetaminophen  325 MG tablet Commonly known as: TYLENOL    albuterol  108 (90 Base) MCG/ACT inhaler Commonly known as: VENTOLIN  HFA   allopurinol  100 MG tablet Commonly known as: ZYLOPRIM    aspirin  81 MG chewable tablet   bisacodyl 10 MG suppository Commonly known as: DULCOLAX   carbamide peroxide 6.5 % OTIC solution Commonly known as: DEBROX   colchicine  0.6 MG tablet   CRANBERRY PO   diltiazem  180 MG 24 hr capsule Commonly known as: DILACOR XR    divalproex  500 MG 24 hr tablet Commonly known as: DEPAKOTE  ER   furosemide  20 MG tablet Commonly known as: LASIX    insulin  aspart 100 UNIT/ML injection Commonly known as: novoLOG    Insulin  Glargine Solostar 100 UNIT/ML Solostar Pen Commonly known as: LANTUS    ipratropium-albuterol  0.5-2.5 (3) MG/3ML Soln Commonly known as: DUONEB   levothyroxine  125 MCG tablet Commonly known as: SYNTHROID    magnesium  hydroxide 400 MG/5ML suspension Commonly known as: MILK OF MAGNESIA   memantine  10 MG tablet Commonly known as: NAMENDA    mirtazapine  15 MG tablet Commonly known as: REMERON    NUTRITIONAL SUPPLEMENT PO   ondansetron  4 MG tablet Commonly known as: ZOFRAN    Oyster Shell Calcium  w/D 500-200 MG-UNIT Tabs   pantoprazole  40 MG tablet Commonly known as: PROTONIX    PARoxetine  40 MG tablet Commonly  known as: PAXIL    polyethylene glycol powder 17 GM/SCOOP powder Commonly known as: GLYCOLAX /MIRALAX    promethazine  12.5 MG suppository Commonly known as: PHENERGAN    QUEtiapine  Fumarate 150 MG 24 hr tablet Commonly known as: SEROQUEL  XR   RA SALINE ENEMA RE   rosuvastatin  40 MG tablet Commonly known as: CRESTOR    saccharomyces boulardii  250 MG capsule Commonly known as: FLORASTOR   senna-docusate 8.6-50 MG tablet Commonly known as: Senokot-S   Trulicity 1.5 MG/0.5ML Soaj Generic drug: Dulaglutide   vitamin B-12 500 MCG tablet Commonly known as: CYANOCOBALAMIN    Zinc  Oxide 10 % Aero       TAKE these medications    glycopyrrolate  1 MG tablet Commonly known as: ROBINUL  Take 1 tablet (1 mg total) by mouth every 4 (four) hours as needed (excessive secretions).   midazolam  2 MG/2ML Soln injection Commonly known as: VERSED  Inject 1-2 mLs (1-2 mg total) into the vein every 4 (four) hours as needed for agitation.   morphine  (PF) 2 MG/ML injection Inject 0.5-2 mLs (1-4 mg total) into the vein every 15 (fifteen) minutes as needed (To alleviate signs and symptoms of distress).               Discharge Care Instructions  (From admission, onward)           Start     Ordered   06/15/24 0000  Discharge wound care:       Comments: Wound care  Daily      Comments: Cleanse sacral wound with NS, apply Medihoney to wound bed daily, cover with dry gauze and secure with silicone foam or ABD pad whichever is preferred   06/15/24 1106            Follow-up Information     Demarchi, Elsie HERO, MD Follow up.   Specialty: Family Medicine Why: As needed Contact information: [phone] [address] [place] [address] [phone]                No Known Allergies   Other Procedures/Studies: ECHOCARDIOGRAM COMPLETE Result Date: 06/12/2024    ECHOCARDIOGRAM REPORT   Patient Name:   Claudia Keller Date of Exam: 06/12/2024 Medical Rec #:  [id number]      Height:       64.0 in Accession #:    [id number]     Weight:       209.4 lb Date of Birth:  [birth year]      BSA:          1.995 m Patient Age:    72 years       BP:           127/50 mmHg Patient Gender: F              HR:           87 bpm. Exam Location:  Inpatient Procedure: 2D Echo, Cardiac Doppler and Color Doppler (Both Spectral and Color             Flow Doppler were utilized during procedure). Indications:    Pericardial EFfusion  History:        Patient has prior history of Echocardiogram examinations. Risk                 Factors:Hypertension.  Sonographer:    Vella Key Referring Phys: SUBRINA SUNDIL IMPRESSIONS  1. Left ventricular ejection fraction, by estimation, is 60 to 65%. The left ventricle has normal function. The left ventricle has  no regional wall motion abnormalities. There is mild asymmetric left ventricular hypertrophy of the basal and septal segments. Left ventricular diastolic parameters were normal.  2. Right ventricular systolic function is normal. The right ventricular size is normal.  3. The mitral valve is abnormal. Trivial mitral valve regurgitation. No evidence of mitral stenosis.  4. The aortic valve is tricuspid. There is moderate calcification of the aortic valve. There is moderate thickening of the aortic valve. Aortic valve regurgitation is not visualized. Aortic valve sclerosis/calcification is present, without any evidence of aortic stenosis.  5. The inferior vena cava is normal in size with greater than 50% respiratory variability, suggesting right atrial pressure of 3 mmHg. FINDINGS  Left Ventricle: Left ventricular ejection fraction, by estimation, is 60 to 65%. The left ventricle has normal function. The left ventricle has no regional wall motion abnormalities. Strain was performed and the global longitudinal strain is indeterminate. The left ventricular internal cavity size was normal in size. There is mild asymmetric left ventricular hypertrophy of the basal and septal segments. Left ventricular diastolic parameters were normal. Right Ventricle: The right ventricular size is normal. No increase in right ventricular wall thickness. Right ventricular systolic function is normal. Left Atrium: Left atrial size was normal in size. Right Atrium: Right atrial size was normal in size. Pericardium: There is no evidence of  pericardial effusion. Mitral Valve: The mitral valve is abnormal. There is mild thickening of the mitral valve leaflet(s). Trivial mitral valve regurgitation. No evidence of mitral valve stenosis. Tricuspid Valve: The tricuspid valve is normal in structure. Tricuspid valve regurgitation is mild . No evidence of tricuspid stenosis. Aortic Valve: The aortic valve is tricuspid. There is moderate calcification of the aortic valve. There is moderate thickening of the aortic valve. Aortic valve regurgitation is not visualized. Aortic valve sclerosis/calcification is present, without any  evidence of aortic stenosis. Pulmonic Valve: The pulmonic valve was normal in structure. Pulmonic valve regurgitation is mild. No evidence of pulmonic stenosis. Aorta: The aortic root is normal in size and structure. Venous: The inferior vena cava is normal in size with greater than 50% respiratory variability, suggesting right atrial pressure of 3 mmHg. IAS/Shunts: No atrial level shunt detected by color flow Doppler. Additional Comments: 3D was performed not requiring image post processing on an independent workstation and was indeterminate.  LEFT VENTRICLE PLAX 2D LVIDd:         3.00 cm     Diastology LVIDs:         1.90 cm     LV e' medial:    10.60 cm/s LV PW:         1.20 cm     LV E/e' medial:  10.4 LV IVS:        1.20 cm     LV e' lateral:   11.20 cm/s LVOT diam:     1.70 cm     LV E/e' lateral: 9.8 LV SV:         64 LV SV Index:   32 LVOT Area:     2.27 cm  LV Volumes (MOD) LV vol d, MOD A2C: 62.8 ml LV vol d, MOD A4C: 51.5 ml LV vol s, MOD A2C: 20.1 ml LV vol s, MOD A4C: 22.4 ml LV SV MOD A2C:     42.7 ml LV SV MOD A4C:     51.5 ml LV SV MOD BP:      36.3 ml RIGHT VENTRICLE RV Basal diam:  2.60 cm RV S prime:  17.20 cm/s TAPSE (M-mode): 1.8 cm LEFT ATRIUM             Index        RIGHT ATRIUM          Index LA diam:        3.30 cm 1.65 cm/m   RA Area:     7.35 cm LA Vol (A2C):   22.0 ml 11.03 ml/m  RA Volume:   13.10 ml  6.57 ml/m LA Vol (A4C):   18.7 ml 9.37 ml/m LA Biplane Vol: 20.7 ml 10.38 ml/m  AORTIC VALVE LVOT Vmax:   136.00 cm/s LVOT Vmean:  109.000 cm/s LVOT VTI:    0.280 m  AORTA Ao Root diam: 2.80 cm MITRAL VALVE MV Area (PHT): 4.44 cm     SHUNTS MV Decel Time: 171 msec     Systemic VTI:  0.28 m MV E velocity: 110.00 cm/s  Systemic Diam: 1.70 cm MV A velocity: 135.00 cm/s MV E/A ratio:  0.81 Maude Emmer MD Electronically signed by Maude Emmer MD Signature Date/Time: 06/12/2024/10:04:48 AM    Final    CT HEAD WO CONTRAST ( ) Result Date: 06/11/2024 CLINICAL DATA:  Mental status change. EXAM: CT HEAD WITHOUT CONTRAST TECHNIQUE: Contiguous axial images were obtained from the base of the skull through the vertex without intravenous contrast. RADIATION DOSE REDUCTION: This exam was performed according to the departmental dose-optimization program which includes automated exposure control, adjustment of the mA and/or kV according to patient size and/or use of iterative reconstruction technique. COMPARISON:  Head CT 03/06/2023 FINDINGS: Brain: No intracranial hemorrhage, mass effect, or midline shift. Stable degree of atrophy and chronic small vessel ischemia. No hydrocephalus. The basilar cisterns are patent. No evidence of territorial infarct or acute ischemia. No extra-axial or intracranial fluid collection. Vascular: Atherosclerosis of skullbase vasculature without hyperdense vessel or abnormal calcification. Skull: No fracture or focal lesion. Sinuses/Orbits: No acute findings. Other: None. IMPRESSION: 1. No acute intracranial abnormality. 2. Stable atrophy and chronic small vessel ischemia. Electronically Signed   By: Andrea Gasman M.D.   On: 06/11/2024 22:01   CT CHEST ABDOMEN PELVIS W CONTRAST Result Date: 06/11/2024 CLINICAL DATA:  Sepsis. EXAM: CT CHEST, ABDOMEN, AND PELVIS WITH CONTRAST TECHNIQUE: Multidetector CT imaging of the chest, abdomen and pelvis was performed following the standard protocol  during bolus administration of intravenous contrast. RADIATION DOSE REDUCTION: This exam was performed according to the departmental dose-optimization program which includes automated exposure control, adjustment of the mA and/or kV according to patient size and/or use of iterative reconstruction technique. CONTRAST:  75mL OMNIPAQUE  IOHEXOL  350 MG/ML SOLN COMPARISON:  CT abdomen pelvis 11/27/2023 and CT chest 11/09/2022. FINDINGS: CT CHEST FINDINGS Cardiovascular: Atherosclerotic calcification of the aorta, aortic valve and coronary arteries. Heart is enlarged. No pericardial effusion. Mediastinum/Nodes: No pathologically enlarged mediastinal, hilar or axillary lymph nodes. Air in the esophagus can be seen with dysmotility. Lungs/Pleura: Collapse/consolidation in the posterior segment right upper lobe and both lower lobes. Scattered ground-glass and mild septal thickening in the non dependent portions of both lungs. Trace left pleural fluid. Airway is unremarkable. Musculoskeletal: Degenerative changes in the spine. CT ABDOMEN PELVIS FINDINGS Hepatobiliary: Liver is unremarkable. Cholecystectomy. No biliary ductal dilatation. Pancreas: Negative. Spleen: Negative. Adrenals/Urinary Tract: Adrenal glands are unremarkable. Small bilateral renal stones. Subcentimeter low-attenuation lesions in the left kidney, too small to characterize. No specific follow-up necessary. Ureters are decompressed. High attenuation layers along the posterior bladder wall, new from 11/27/2023. Stomach/Bowel: Stomach, small bowel, appendix and colon  are unremarkable. Moderate stool burden. Vascular/Lymphatic: Atherosclerotic calcification of the aorta. No pathologically enlarged lymph nodes. Reproductive: Uterus is visualized.  No adnexal mass. Other: No free fluid.  Mesenteries and peritoneum are unremarkable. Musculoskeletal: Osteopenia.  Degenerative changes in the spine. IMPRESSION: 1. Collapse/consolidation in the posterior segment right  upper lobe and both lower lobes, likely a combination aspiration and pneumonia. 2. Trace left pleural fluid. 3. Small bilateral renal stones with layering stone debris in the bladder. 4. Aortic atherosclerosis (ICD10-I70.0). Coronary artery calcification. 5. CT head dictated separately. Electronically Signed   By: Newell Eke M.D.   On: 06/11/2024 21:44   DG Chest Portable 1 View Result Date: 06/11/2024 CLINICAL DATA:  PICC placement. EXAM: PORTABLE CHEST 1 VIEW COMPARISON:  Radiograph earlier today FINDINGS: No central line is seen. There multiple overlying artifacts. The previous ring like opacity projecting over the right lung is not seen and was likely external to the patient. Central vascular engorgement with bilateral ill-defined perihilar opacities. No pneumothorax or large pleural effusion. Heart is normal in size. Aortic atherosclerosis. IMPRESSION: 1. No central line or PICC is seen. 2. Central vascular engorgement with bilateral ill-defined perihilar opacities. This may represent pulmonary edema or atelectasis. Electronically Signed   By: Andrea Gasman M.D.   On: 06/11/2024 19:57   DG Chest Portable 1 View Result Date: 06/11/2024 CLINICAL DATA:  Shortness of breath and altered mental status EXAM: PORTABLE CHEST 1 VIEW COMPARISON:  Chest radiograph dated 11/27/2023 FINDINGS: Low lung volumes with bronchovascular crowding. Bilateral patchy opacities, right-greater-than-left. Ring-like opacity in the right mid lung. No pleural effusion or pneumothorax. Enlarged cardiomediastinal silhouette is likely projectional. No acute osseous abnormality. IMPRESSION: 1. Bilateral patchy opacities, right-greater-than-left, may represent atelectasis, aspiration, or pneumonia. 2. Ring-like opacity in the right mid lung, which may be artifactual related to overlying support apparatus or represent a cavitary lesion. Consider repeat chest radiograph with removal of overlying devices. Electronically Signed   By:  Limin  Xu M.D.   On: 06/11/2024 15:03     TODAY-DAY OF DISCHARGE:  Subjective:   Claudia Keller today remains poorly responsive.  Objective:   Blood pressure (!) 144/65, pulse (!) 101, temperature 98 F (36.7 C), temperature source Oral, resp. rate (!) 24, height 5' 4 (1.626 m), weight 95 kg, SpO2 93%.  Intake/Output Summary (Last 24 hours) at 06/15/2024 1414 Last data filed at 06/14/2024 2105 Gross per 24 hour  Intake 3 ml  Output 650 ml  Net -647 ml   Filed Weights   06/11/24 1417  Weight: 95 kg    Exam: Comfortable-confused. [place].AT,PERRAL Supple Neck,No JVD, No cervical lymphadenopathy appriciated.  Symmetrical Chest wall movement, Good air movement bilaterally, CTAB RRR,No Gallops,Rubs or new Murmurs, No Parasternal Heave +ve B.Sounds, Abd Soft, Non tender, No organomegaly appriciated, No rebound -guarding or rigidity. No Cyanosis, Clubbing or edema, No new Rash or bruise   PERTINENT RADIOLOGIC STUDIES: No results found.   PERTINENT LAB RESULTS: CBC: Recent Labs    06/13/24 0555 06/14/24 0426  WBC 9.0 8.6  HGB 7.3* 8.1*  HCT 23.3* 25.6*  PLT 37* 37*   CMET CMP     Component Value Date/Time   NA 150 (H) 06/14/2024 0426   NA 139 02/08/2022 0000   K 3.6 06/14/2024 0426   CL 117 (H) 06/14/2024 0426   CO2 24 06/14/2024 0426   GLUCOSE 163 (H) 06/14/2024 0426   BUN 15 06/14/2024 0426   BUN 22 (A) 02/08/2022 0000   CREATININE 1.12 (H) 06/14/2024 0426  CREATININE 1.07 (H) 08/14/2019 0907   CALCIUM  8.7 (L) 06/14/2024 0426   PROT 5.0 (L) 06/11/2024 1450   ALBUMIN  2.0 (L) 06/11/2024 1450   AST 26 06/11/2024 1450   ALT 15 06/11/2024 1450   ALKPHOS 73 06/11/2024 1450   BILITOT 0.8 06/11/2024 1450   GFRNONAA 53 (L) 06/14/2024 0426    GFR Estimated Creatinine Clearance: 51.5 mL/min (A) (by C-G formula based on SCr of 1.12 mg/dL (H)). No results for input(s): LIPASE, AMYLASE in the last 72 hours. No results for input(s): CKTOTAL, CKMB, CKMBINDEX,  TROPONINI in the last 72 hours. Invalid input(s): POCBNP No results for input(s): DDIMER in the last 72 hours. No results for input(s): HGBA1C in the last 72 hours. No results for input(s): CHOL, HDL, LDLCALC, TRIG, CHOLHDL, LDLDIRECT in the last 72 hours. No results for input(s): TSH, T4TOTAL, T3FREE, THYROIDAB in the last 72 hours.  Invalid input(s): FREET3 Recent Labs    06/13/24 0555  FOLATE 9.8   Coags: No results for input(s): INR in the last 72 hours.  Invalid input(s): PT Microbiology: Recent Results (from the past 240 hours)  Culture, blood (routine x 2)     Status: None (Preliminary result)   Collection Time: 06/11/24  2:50 PM   Specimen: BLOOD  Result Value Ref Range Status   Specimen Description BLOOD SITE NOT SPECIFIED  Final   Special Requests   Final    BOTTLES DRAWN AEROBIC AND ANAEROBIC Blood Culture results may not be optimal due to an inadequate volume of blood received in culture bottles   Culture   Final    NO GROWTH 4 DAYS Performed at [facility], [address], [place], [place] [address]    Report Status PENDING  Incomplete  Culture, blood (routine x 2)     Status: None (Preliminary result)   Collection Time: 06/11/24  2:50 PM   Specimen: BLOOD  Result Value Ref Range Status   Specimen Description BLOOD SITE NOT SPECIFIED  Final   Special Requests   Final    BOTTLES DRAWN AEROBIC AND ANAEROBIC Blood Culture results may not be optimal due to an inadequate volume of blood received in culture bottles   Culture   Final    NO GROWTH 4 DAYS Performed at [facility], [address], [place], [place] [address]    Report Status PENDING  Incomplete  Resp panel by RT-PCR (RSV, Flu A&B, Covid) Anterior Nasal Swab     Status: None   Collection Time: 06/11/24  3:06 PM   Specimen: Anterior Nasal Swab  Result Value Ref Range Status   SARS Coronavirus 2 by RT PCR NEGATIVE NEGATIVE Final   Influenza A by PCR  NEGATIVE NEGATIVE Final   Influenza B by PCR NEGATIVE NEGATIVE Final    Comment: (NOTE) The Xpert Xpress SARS-CoV-2/FLU/RSV plus assay is intended as an aid in the diagnosis of influenza from Nasopharyngeal swab specimens and should not be used as a sole basis for treatment. Nasal washings and aspirates are unacceptable for Xpert Xpress SARS-CoV-2/FLU/RSV testing.  Fact Sheet for Patients: BloggerCourse.com  Fact Sheet for Healthcare Providers: SeriousBroker.it  This test is not yet approved or cleared by the [place]  FDA and has been authorized for detection and/or diagnosis of SARS-CoV-2 by FDA under an Emergency Use Authorization (EUA). This EUA will remain in effect (meaning this test can be used) for the duration of the COVID-19 declaration under Section 564(b)(1) of the Act, 21 U.S.C. section 360bbb-3(b)(1), unless  the authorization is terminated or revoked.     Resp Syncytial Virus by PCR NEGATIVE NEGATIVE Final    Comment: (NOTE) Fact Sheet for Patients: BloggerCourse.com  Fact Sheet for Healthcare Providers: SeriousBroker.it  This test is not yet approved or cleared by the [place]  FDA and has been authorized for detection and/or diagnosis of SARS-CoV-2 by FDA under an Emergency Use Authorization (EUA). This EUA will remain in effect (meaning this test can be used) for the duration of the COVID-19 declaration under Section 564(b)(1) of the Act, 21 U.S.C. section 360bbb-3(b)(1), unless the authorization is terminated or revoked.  Performed at [facility], [address], [place], [place] [address]   Urine Culture     Status: Abnormal   Collection Time: 06/11/24  3:06 PM   Specimen: Urine, Random  Result Value Ref Range Status   Specimen Description URINE, RANDOM  Final   Special Requests   Final    NONE Reflexed from [id number] Performed at [facility], [address], [place], [place] [address]    Culture (A)  Final    >=100,000 COLONIES/mL ESCHERICHIA COLI 50,000 COLONIES/mL PROVIDENCIA STUARTII 10,000 COLONIES/mL ENTEROCOCCUS FAECALIS    Report Status 06/15/2024 FINAL  Final   Organism ID, Bacteria ESCHERICHIA COLI (A)  Final   Organism ID, Bacteria PROVIDENCIA STUARTII (A)  Final   Organism ID, Bacteria ENTEROCOCCUS FAECALIS (A)  Final      Susceptibility   Escherichia coli - MIC*    AMPICILLIN  16 INTERMEDIATE Intermediate     CEFAZOLIN  <=4 SENSITIVE Sensitive     CEFEPIME  <=0.12 SENSITIVE Sensitive     CEFTRIAXONE  <=0.25 SENSITIVE Sensitive     CIPROFLOXACIN  >=4 RESISTANT Resistant     GENTAMICIN <=1 SENSITIVE Sensitive     IMIPENEM <=0.25 SENSITIVE Sensitive     NITROFURANTOIN <=16 SENSITIVE Sensitive     TRIMETH /SULFA  80 RESISTANT Resistant     AMPICILLIN /SULBACTAM 4 SENSITIVE Sensitive     PIP/TAZO <=4 SENSITIVE Sensitive ug/mL    * >=100,000 COLONIES/mL ESCHERICHIA COLI   Enterococcus faecalis - MIC*    AMPICILLIN  <=2 SENSITIVE Sensitive     NITROFURANTOIN <=16 SENSITIVE Sensitive     VANCOMYCIN  1 SENSITIVE Sensitive     * 10,000 COLONIES/mL ENTEROCOCCUS FAECALIS   Providencia stuartii - MIC*    AMPICILLIN  RESISTANT Resistant     CEFEPIME  <=0.12 SENSITIVE Sensitive     CEFTRIAXONE  <=0.25 SENSITIVE Sensitive     CIPROFLOXACIN  >=4 RESISTANT Resistant     GENTAMICIN RESISTANT Resistant     IMIPENEM 1 SENSITIVE Sensitive     NITROFURANTOIN 256 RESISTANT Resistant     TRIMETH /SULFA  <=20 SENSITIVE Sensitive     AMPICILLIN /SULBACTAM 8 SENSITIVE Sensitive     PIP/TAZO <=4 SENSITIVE Sensitive ug/mL    * 50,000 COLONIES/mL PROVIDENCIA STUARTII    FURTHER DISCHARGE INSTRUCTIONS:  Get Medicines reviewed and adjusted: Please take all your medications with you for your next visit with your Primary MD  Laboratory/radiological data: Please request your Primary MD to go over all hospital tests and  procedure/radiological results at the follow up, please ask your Primary MD to get all Hospital records sent to his/her office.  In some cases, they will be blood work, cultures and biopsy results pending at the time of your discharge. Please request that your primary care M.D. goes through all the records of your hospital data and follows up on these results.  Also Note the following: If you experience worsening of your admission symptoms, develop  shortness of breath, life threatening emergency, suicidal or homicidal thoughts you must seek medical attention immediately by calling 911 or calling your MD immediately  if symptoms less severe.  You must read complete instructions/literature along with all the possible adverse reactions/side effects for all the Medicines you take and that have been prescribed to you. Take any new Medicines after you have completely understood and accpet all the possible adverse reactions/side effects.   Do not drive when taking Pain medications or sleeping medications (Benzodaizepines)  Do not take more than prescribed Pain, Sleep and Anxiety Medications. It is not advisable to combine anxiety,sleep and pain medications without talking with your primary care practitioner  Special Instructions: If you have smoked or chewed Tobacco  in the last 2 yrs please stop smoking, stop any regular Alcohol   and or any Recreational drug use.  Wear Seat belts while driving.  Please note: You were cared for by a hospitalist during your hospital stay. Once you are discharged, your primary care physician will handle any further medical issues. Please note that NO REFILLS for any discharge medications will be authorized once you are discharged, as it is imperative that you return to your primary care physician (or establish a relationship with a primary care physician if you do not have one) for your post hospital discharge needs so that they can reassess your need for medications and  monitor your lab values.  Total Time spent coordinating discharge including counseling, education and face to face time equals greater than 30 minutes.  SignedBETHA Donalda Applebaum 06/15/2024 2:14 PM

## 2024-06-15 NOTE — Plan of Care (Signed)
  Problem: Health Behavior/Discharge Planning: Goal: Ability to manage health-related needs will improve Outcome: Progressing   Problem: Skin Integrity: Goal: Risk for impaired skin integrity will decrease Outcome: Progressing   Problem: Clinical Measurements: Goal: Will remain free from infection Outcome: Progressing

## 2024-06-16 LAB — CULTURE, BLOOD (ROUTINE X 2)
Culture: NO GROWTH
Culture: NO GROWTH

## 2024-07-09 DEATH — deceased

## 2024-11-08 ENCOUNTER — Encounter: Payer: Self-pay | Admitting: Gastroenterology
